# Patient Record
Sex: Male | Born: 1965 | Race: Black or African American | Hispanic: No | Marital: Married | Smoking: Current some day smoker
Health system: Southern US, Community
[De-identification: ages and names within clinical notes are randomized; demographics above are authoritative.]

## PROBLEM LIST (undated history)

## (undated) ENCOUNTER — Emergency Department (HOSPITAL_COMMUNITY): Admission: EM | Discharge status: Against Medical Advice | Payer: Medicare Other

## (undated) DIAGNOSIS — D649 Anemia, unspecified: Secondary | ICD-10-CM

## (undated) DIAGNOSIS — N189 Chronic kidney disease, unspecified: Secondary | ICD-10-CM

## (undated) DIAGNOSIS — Z5189 Encounter for other specified aftercare: Secondary | ICD-10-CM

## (undated) DIAGNOSIS — Z992 Dependence on renal dialysis: Secondary | ICD-10-CM

## (undated) DIAGNOSIS — K219 Gastro-esophageal reflux disease without esophagitis: Secondary | ICD-10-CM

## (undated) DIAGNOSIS — N186 End stage renal disease: Secondary | ICD-10-CM

## (undated) DIAGNOSIS — Z89512 Acquired absence of left leg below knee: Secondary | ICD-10-CM

## (undated) DIAGNOSIS — J40 Bronchitis, not specified as acute or chronic: Secondary | ICD-10-CM

## (undated) DIAGNOSIS — IMO0001 Reserved for inherently not codable concepts without codable children: Secondary | ICD-10-CM

## (undated) DIAGNOSIS — E213 Hyperparathyroidism, unspecified: Secondary | ICD-10-CM

## (undated) DIAGNOSIS — I499 Cardiac arrhythmia, unspecified: Secondary | ICD-10-CM

## (undated) DIAGNOSIS — Z8614 Personal history of Methicillin resistant Staphylococcus aureus infection: Secondary | ICD-10-CM

## (undated) DIAGNOSIS — I509 Heart failure, unspecified: Secondary | ICD-10-CM

## (undated) DIAGNOSIS — D631 Anemia in chronic kidney disease: Secondary | ICD-10-CM

## (undated) HISTORY — PX: OTHER SURGICAL HISTORY: SHX169

## (undated) HISTORY — DX: Heart failure, unspecified: I50.9

## (undated) HISTORY — DX: Morbid (severe) obesity due to excess calories: E66.01

## (undated) HISTORY — PX: ANKLE FUSION: SHX881

## (undated) HISTORY — PX: PARATHYROIDECTOMY: SHX19

## (undated) HISTORY — DX: Chronic kidney disease, unspecified: N18.9

## (undated) HISTORY — PX: HERNIA REPAIR: SHX51

## (undated) HISTORY — PX: THROMBECTOMY: PRO61

---

## 1999-03-24 ENCOUNTER — Emergency Department (HOSPITAL_COMMUNITY): Admission: EM | Admit: 1999-03-24 | Discharge: 1999-03-24 | Payer: Self-pay | Admitting: Internal Medicine

## 1999-03-28 ENCOUNTER — Emergency Department (HOSPITAL_COMMUNITY): Admission: EM | Admit: 1999-03-28 | Discharge: 1999-03-28 | Payer: Self-pay | Admitting: Emergency Medicine

## 1999-03-30 ENCOUNTER — Encounter: Admission: RE | Admit: 1999-03-30 | Discharge: 1999-06-28 | Payer: Self-pay

## 1999-04-05 ENCOUNTER — Encounter: Admission: RE | Admit: 1999-04-05 | Discharge: 1999-04-05 | Payer: Self-pay | Admitting: Hematology and Oncology

## 1999-04-12 ENCOUNTER — Emergency Department (HOSPITAL_COMMUNITY): Admission: EM | Admit: 1999-04-12 | Discharge: 1999-04-12 | Payer: Self-pay | Admitting: Emergency Medicine

## 1999-05-11 ENCOUNTER — Encounter: Admission: RE | Admit: 1999-05-11 | Discharge: 1999-05-11 | Payer: Self-pay | Admitting: Internal Medicine

## 1999-10-18 ENCOUNTER — Encounter: Admission: RE | Admit: 1999-10-18 | Discharge: 1999-10-18 | Payer: Self-pay | Admitting: Internal Medicine

## 2000-02-13 ENCOUNTER — Encounter: Admission: RE | Admit: 2000-02-13 | Discharge: 2000-02-13 | Payer: Self-pay | Admitting: Hematology and Oncology

## 2000-02-13 ENCOUNTER — Ambulatory Visit (HOSPITAL_COMMUNITY): Admission: RE | Admit: 2000-02-13 | Discharge: 2000-02-13 | Payer: Self-pay | Admitting: Internal Medicine

## 2000-02-18 ENCOUNTER — Encounter: Admission: RE | Admit: 2000-02-18 | Discharge: 2000-02-18 | Payer: Self-pay | Admitting: Internal Medicine

## 2000-04-26 ENCOUNTER — Emergency Department (HOSPITAL_COMMUNITY): Admission: EM | Admit: 2000-04-26 | Discharge: 2000-04-26 | Payer: Self-pay | Admitting: Internal Medicine

## 2001-02-25 ENCOUNTER — Encounter: Admission: RE | Admit: 2001-02-25 | Discharge: 2001-02-25 | Payer: Self-pay | Admitting: Internal Medicine

## 2001-07-21 ENCOUNTER — Emergency Department (HOSPITAL_COMMUNITY): Admission: EM | Admit: 2001-07-21 | Discharge: 2001-07-21 | Payer: Self-pay | Admitting: Emergency Medicine

## 2002-03-10 ENCOUNTER — Emergency Department (HOSPITAL_COMMUNITY): Admission: EM | Admit: 2002-03-10 | Discharge: 2002-03-10 | Payer: Self-pay | Admitting: Emergency Medicine

## 2002-08-06 ENCOUNTER — Encounter: Admission: RE | Admit: 2002-08-06 | Discharge: 2002-08-06 | Payer: Self-pay | Admitting: Internal Medicine

## 2002-08-16 ENCOUNTER — Encounter: Admission: RE | Admit: 2002-08-16 | Discharge: 2002-08-16 | Payer: Self-pay | Admitting: Internal Medicine

## 2002-08-16 ENCOUNTER — Encounter: Payer: Self-pay | Admitting: Hospitalist

## 2002-08-16 ENCOUNTER — Ambulatory Visit (HOSPITAL_COMMUNITY): Admission: RE | Admit: 2002-08-16 | Discharge: 2002-08-16 | Payer: Self-pay | Admitting: Hospitalist

## 2002-09-02 ENCOUNTER — Encounter: Admission: RE | Admit: 2002-09-02 | Discharge: 2002-09-02 | Payer: Self-pay | Admitting: Internal Medicine

## 2002-09-02 ENCOUNTER — Encounter (HOSPITAL_COMMUNITY): Admission: RE | Admit: 2002-09-02 | Discharge: 2002-12-01 | Payer: Self-pay | Admitting: Nephrology

## 2002-12-02 ENCOUNTER — Encounter (HOSPITAL_COMMUNITY): Admission: RE | Admit: 2002-12-02 | Discharge: 2003-03-02 | Payer: Self-pay | Admitting: Nephrology

## 2003-01-07 ENCOUNTER — Encounter: Admission: RE | Admit: 2003-01-07 | Discharge: 2003-01-07 | Payer: Self-pay | Admitting: Internal Medicine

## 2003-02-04 ENCOUNTER — Encounter: Admission: RE | Admit: 2003-02-04 | Discharge: 2003-02-04 | Payer: Self-pay | Admitting: Internal Medicine

## 2003-02-08 ENCOUNTER — Encounter: Admission: RE | Admit: 2003-02-08 | Discharge: 2003-02-08 | Payer: Self-pay | Admitting: Nephrology

## 2003-02-08 ENCOUNTER — Encounter: Payer: Self-pay | Admitting: Nephrology

## 2003-02-15 ENCOUNTER — Inpatient Hospital Stay (HOSPITAL_COMMUNITY): Admission: EM | Admit: 2003-02-15 | Discharge: 2003-02-28 | Payer: Self-pay | Admitting: Emergency Medicine

## 2003-02-16 ENCOUNTER — Encounter: Payer: Self-pay | Admitting: Pulmonary Disease

## 2003-02-16 ENCOUNTER — Encounter: Payer: Self-pay | Admitting: Critical Care Medicine

## 2003-02-16 ENCOUNTER — Encounter: Payer: Self-pay | Admitting: Cardiology

## 2003-02-17 ENCOUNTER — Encounter: Payer: Self-pay | Admitting: Pulmonary Disease

## 2003-03-02 ENCOUNTER — Encounter: Admission: RE | Admit: 2003-03-02 | Discharge: 2003-03-02 | Payer: Self-pay | Admitting: Internal Medicine

## 2003-03-03 ENCOUNTER — Emergency Department (HOSPITAL_COMMUNITY): Admission: EM | Admit: 2003-03-03 | Discharge: 2003-03-03 | Payer: Self-pay

## 2003-03-04 ENCOUNTER — Encounter: Admission: RE | Admit: 2003-03-04 | Discharge: 2003-03-04 | Payer: Self-pay | Admitting: Internal Medicine

## 2003-03-05 ENCOUNTER — Encounter: Payer: Self-pay | Admitting: Nephrology

## 2003-03-05 ENCOUNTER — Inpatient Hospital Stay (HOSPITAL_COMMUNITY): Admission: EM | Admit: 2003-03-05 | Discharge: 2003-03-17 | Payer: Self-pay | Admitting: Emergency Medicine

## 2003-03-08 ENCOUNTER — Encounter: Payer: Self-pay | Admitting: Nephrology

## 2003-03-11 ENCOUNTER — Encounter: Payer: Self-pay | Admitting: Specialist

## 2003-03-15 ENCOUNTER — Encounter: Payer: Self-pay | Admitting: Nephrology

## 2003-06-14 ENCOUNTER — Encounter (HOSPITAL_COMMUNITY): Admission: RE | Admit: 2003-06-14 | Discharge: 2003-09-12 | Payer: Self-pay | Admitting: Nephrology

## 2003-09-19 ENCOUNTER — Encounter (HOSPITAL_COMMUNITY): Admission: RE | Admit: 2003-09-19 | Discharge: 2003-12-18 | Payer: Self-pay | Admitting: Nephrology

## 2003-10-06 ENCOUNTER — Emergency Department (HOSPITAL_COMMUNITY): Admission: AD | Admit: 2003-10-06 | Discharge: 2003-10-06 | Payer: Self-pay | Admitting: Family Medicine

## 2003-12-22 ENCOUNTER — Ambulatory Visit (HOSPITAL_COMMUNITY): Admission: RE | Admit: 2003-12-22 | Discharge: 2003-12-22 | Payer: Self-pay | Admitting: *Deleted

## 2004-01-04 ENCOUNTER — Encounter (HOSPITAL_COMMUNITY): Admission: RE | Admit: 2004-01-04 | Discharge: 2004-04-03 | Payer: Self-pay | Admitting: Nephrology

## 2004-01-12 ENCOUNTER — Encounter: Admission: RE | Admit: 2004-01-12 | Discharge: 2004-01-12 | Payer: Self-pay | Admitting: Internal Medicine

## 2004-04-05 ENCOUNTER — Encounter (HOSPITAL_COMMUNITY): Admission: RE | Admit: 2004-04-05 | Discharge: 2004-07-04 | Payer: Self-pay | Admitting: Nephrology

## 2004-04-09 ENCOUNTER — Emergency Department (HOSPITAL_COMMUNITY): Admission: EM | Admit: 2004-04-09 | Discharge: 2004-04-10 | Payer: Self-pay | Admitting: Family Medicine

## 2004-04-19 ENCOUNTER — Ambulatory Visit: Payer: Self-pay | Admitting: Internal Medicine

## 2004-06-19 DIAGNOSIS — Z992 Dependence on renal dialysis: Secondary | ICD-10-CM | POA: Insufficient documentation

## 2004-08-28 ENCOUNTER — Ambulatory Visit (HOSPITAL_COMMUNITY): Admission: RE | Admit: 2004-08-28 | Discharge: 2004-08-28 | Payer: Self-pay | Admitting: *Deleted

## 2004-09-24 ENCOUNTER — Ambulatory Visit (HOSPITAL_COMMUNITY): Admission: RE | Admit: 2004-09-24 | Discharge: 2004-09-24 | Payer: Self-pay | Admitting: Nephrology

## 2004-10-04 ENCOUNTER — Ambulatory Visit (HOSPITAL_COMMUNITY): Admission: RE | Admit: 2004-10-04 | Discharge: 2004-10-04 | Payer: Self-pay | Admitting: Vascular Surgery

## 2004-10-25 ENCOUNTER — Emergency Department (HOSPITAL_COMMUNITY): Admission: EM | Admit: 2004-10-25 | Discharge: 2004-10-25 | Payer: Self-pay | Admitting: Emergency Medicine

## 2004-11-05 ENCOUNTER — Ambulatory Visit (HOSPITAL_COMMUNITY): Admission: RE | Admit: 2004-11-05 | Discharge: 2004-11-05 | Payer: Self-pay | Admitting: Vascular Surgery

## 2004-11-19 ENCOUNTER — Ambulatory Visit: Payer: Self-pay | Admitting: Internal Medicine

## 2004-11-30 DIAGNOSIS — I10 Essential (primary) hypertension: Secondary | ICD-10-CM | POA: Insufficient documentation

## 2004-12-06 ENCOUNTER — Ambulatory Visit (HOSPITAL_COMMUNITY): Admission: RE | Admit: 2004-12-06 | Discharge: 2004-12-06 | Payer: Self-pay | Admitting: Surgery

## 2005-02-18 ENCOUNTER — Ambulatory Visit (HOSPITAL_COMMUNITY): Admission: RE | Admit: 2005-02-18 | Discharge: 2005-02-18 | Payer: Self-pay | Admitting: Vascular Surgery

## 2005-02-19 ENCOUNTER — Ambulatory Visit (HOSPITAL_COMMUNITY): Admission: RE | Admit: 2005-02-19 | Discharge: 2005-02-19 | Payer: Self-pay | Admitting: Vascular Surgery

## 2005-03-13 ENCOUNTER — Ambulatory Visit (HOSPITAL_COMMUNITY): Admission: RE | Admit: 2005-03-13 | Discharge: 2005-03-13 | Payer: Self-pay | Admitting: Vascular Surgery

## 2005-06-18 ENCOUNTER — Ambulatory Visit (HOSPITAL_COMMUNITY): Admission: RE | Admit: 2005-06-18 | Discharge: 2005-06-18 | Payer: Self-pay | Admitting: Vascular Surgery

## 2005-08-08 ENCOUNTER — Ambulatory Visit (HOSPITAL_COMMUNITY): Admission: RE | Admit: 2005-08-08 | Discharge: 2005-08-08 | Payer: Self-pay | Admitting: Nephrology

## 2006-12-30 ENCOUNTER — Emergency Department (HOSPITAL_COMMUNITY): Admission: EM | Admit: 2006-12-30 | Discharge: 2006-12-30 | Payer: Self-pay | Admitting: Emergency Medicine

## 2007-01-07 ENCOUNTER — Emergency Department (HOSPITAL_COMMUNITY): Admission: EM | Admit: 2007-01-07 | Discharge: 2007-01-07 | Payer: Self-pay | Admitting: Emergency Medicine

## 2007-02-05 ENCOUNTER — Encounter: Admission: RE | Admit: 2007-02-05 | Discharge: 2007-02-05 | Payer: Self-pay | Admitting: Orthopedic Surgery

## 2007-02-09 ENCOUNTER — Encounter (HOSPITAL_COMMUNITY): Admission: RE | Admit: 2007-02-09 | Discharge: 2007-04-20 | Payer: Self-pay | Admitting: Nephrology

## 2007-02-12 ENCOUNTER — Inpatient Hospital Stay (HOSPITAL_COMMUNITY): Admission: RE | Admit: 2007-02-12 | Discharge: 2007-02-15 | Payer: Self-pay | Admitting: Orthopedic Surgery

## 2007-02-17 ENCOUNTER — Ambulatory Visit: Payer: Self-pay | Admitting: Internal Medicine

## 2007-03-03 ENCOUNTER — Ambulatory Visit (HOSPITAL_COMMUNITY): Admission: RE | Admit: 2007-03-03 | Discharge: 2007-03-03 | Payer: Self-pay | Admitting: Internal Medicine

## 2007-03-03 ENCOUNTER — Encounter: Payer: Self-pay | Admitting: Internal Medicine

## 2007-03-18 ENCOUNTER — Ambulatory Visit: Payer: Self-pay | Admitting: Internal Medicine

## 2007-04-09 ENCOUNTER — Encounter (HOSPITAL_COMMUNITY): Admission: RE | Admit: 2007-04-09 | Discharge: 2007-06-18 | Payer: Self-pay | Admitting: Nephrology

## 2007-07-30 ENCOUNTER — Inpatient Hospital Stay (HOSPITAL_COMMUNITY): Admission: RE | Admit: 2007-07-30 | Discharge: 2007-07-31 | Payer: Self-pay | Admitting: Orthopedic Surgery

## 2007-09-03 ENCOUNTER — Encounter: Admission: EM | Admit: 2007-09-03 | Discharge: 2007-09-03 | Payer: Self-pay | Admitting: Dentistry

## 2007-09-03 ENCOUNTER — Ambulatory Visit: Payer: Self-pay | Admitting: Dentistry

## 2007-09-08 ENCOUNTER — Ambulatory Visit: Payer: Self-pay | Admitting: Dentistry

## 2007-10-22 DIAGNOSIS — I519 Heart disease, unspecified: Secondary | ICD-10-CM | POA: Insufficient documentation

## 2007-10-22 DIAGNOSIS — I739 Peripheral vascular disease, unspecified: Secondary | ICD-10-CM | POA: Insufficient documentation

## 2007-10-22 DIAGNOSIS — Z8639 Personal history of other endocrine, nutritional and metabolic disease: Secondary | ICD-10-CM

## 2007-10-22 DIAGNOSIS — D631 Anemia in chronic kidney disease: Secondary | ICD-10-CM | POA: Insufficient documentation

## 2007-10-22 DIAGNOSIS — N186 End stage renal disease: Secondary | ICD-10-CM | POA: Insufficient documentation

## 2007-10-22 DIAGNOSIS — Z862 Personal history of diseases of the blood and blood-forming organs and certain disorders involving the immune mechanism: Secondary | ICD-10-CM

## 2008-06-10 ENCOUNTER — Ambulatory Visit (HOSPITAL_COMMUNITY): Admission: RE | Admit: 2008-06-10 | Discharge: 2008-06-10 | Payer: Self-pay | Admitting: Nephrology

## 2008-07-21 ENCOUNTER — Ambulatory Visit (HOSPITAL_COMMUNITY): Admission: RE | Admit: 2008-07-21 | Discharge: 2008-07-21 | Payer: Self-pay | Admitting: Orthopedic Surgery

## 2008-11-22 ENCOUNTER — Inpatient Hospital Stay (HOSPITAL_COMMUNITY): Admission: RE | Admit: 2008-11-22 | Discharge: 2008-11-25 | Payer: Self-pay | Admitting: Surgery

## 2008-11-22 ENCOUNTER — Encounter (INDEPENDENT_AMBULATORY_CARE_PROVIDER_SITE_OTHER): Payer: Self-pay | Admitting: Surgery

## 2009-12-01 ENCOUNTER — Emergency Department (HOSPITAL_COMMUNITY): Admission: EM | Admit: 2009-12-01 | Discharge: 2009-12-01 | Payer: Self-pay | Admitting: Emergency Medicine

## 2010-01-20 ENCOUNTER — Emergency Department (HOSPITAL_COMMUNITY): Admission: EM | Admit: 2010-01-20 | Discharge: 2010-01-21 | Payer: Self-pay | Admitting: Emergency Medicine

## 2010-01-23 ENCOUNTER — Ambulatory Visit (HOSPITAL_COMMUNITY): Admission: RE | Admit: 2010-01-23 | Discharge: 2010-01-23 | Payer: Self-pay | Admitting: Orthopedic Surgery

## 2010-07-27 ENCOUNTER — Ambulatory Visit (HOSPITAL_COMMUNITY)
Admission: RE | Admit: 2010-07-27 | Discharge: 2010-07-27 | Payer: Self-pay | Source: Home / Self Care | Attending: Nephrology | Admitting: Nephrology

## 2010-08-05 ENCOUNTER — Encounter: Payer: Self-pay | Admitting: Nephrology

## 2010-08-27 ENCOUNTER — Ambulatory Visit (INDEPENDENT_AMBULATORY_CARE_PROVIDER_SITE_OTHER): Payer: Medicare Other | Admitting: Surgery

## 2010-08-27 DIAGNOSIS — N186 End stage renal disease: Secondary | ICD-10-CM

## 2010-08-27 DIAGNOSIS — T82898A Other specified complication of vascular prosthetic devices, implants and grafts, initial encounter: Secondary | ICD-10-CM

## 2010-08-28 ENCOUNTER — Inpatient Hospital Stay (HOSPITAL_COMMUNITY)
Admission: EM | Admit: 2010-08-28 | Discharge: 2010-09-01 | DRG: 252 | Disposition: A | Discharge status: Home / Self Care | Payer: Medicare Other | Source: Ambulatory Visit | Attending: Internal Medicine | Admitting: Internal Medicine

## 2010-08-28 DIAGNOSIS — N186 End stage renal disease: Secondary | ICD-10-CM | POA: Diagnosis present

## 2010-08-28 DIAGNOSIS — F17200 Nicotine dependence, unspecified, uncomplicated: Secondary | ICD-10-CM | POA: Diagnosis present

## 2010-08-28 DIAGNOSIS — A4901 Methicillin susceptible Staphylococcus aureus infection, unspecified site: Secondary | ICD-10-CM | POA: Diagnosis present

## 2010-08-28 DIAGNOSIS — I428 Other cardiomyopathies: Secondary | ICD-10-CM | POA: Diagnosis present

## 2010-08-28 DIAGNOSIS — Z992 Dependence on renal dialysis: Secondary | ICD-10-CM

## 2010-08-28 DIAGNOSIS — T827XXA Infection and inflammatory reaction due to other cardiac and vascular devices, implants and grafts, initial encounter: Principal | ICD-10-CM | POA: Diagnosis present

## 2010-08-28 DIAGNOSIS — R7881 Bacteremia: Secondary | ICD-10-CM | POA: Diagnosis present

## 2010-08-28 DIAGNOSIS — D696 Thrombocytopenia, unspecified: Secondary | ICD-10-CM | POA: Diagnosis present

## 2010-08-28 DIAGNOSIS — IMO0002 Reserved for concepts with insufficient information to code with codable children: Secondary | ICD-10-CM | POA: Diagnosis present

## 2010-08-28 DIAGNOSIS — I120 Hypertensive chronic kidney disease with stage 5 chronic kidney disease or end stage renal disease: Secondary | ICD-10-CM | POA: Diagnosis present

## 2010-08-28 DIAGNOSIS — N2581 Secondary hyperparathyroidism of renal origin: Secondary | ICD-10-CM | POA: Diagnosis present

## 2010-08-28 DIAGNOSIS — Y832 Surgical operation with anastomosis, bypass or graft as the cause of abnormal reaction of the patient, or of later complication, without mention of misadventure at the time of the procedure: Secondary | ICD-10-CM | POA: Diagnosis present

## 2010-08-28 DIAGNOSIS — E119 Type 2 diabetes mellitus without complications: Secondary | ICD-10-CM | POA: Diagnosis present

## 2010-08-28 NOTE — Assessment & Plan Note (Signed)
OFFICE VISIT  [name], [name] DOB:  1965/11/08                                       08/27/2010 [id number]  REASON FOR VISIT:  Questionable infected fistula.  HISTORY:  This is a 45 year old gentleman with end-stage renal disease who dialyzes through a right upper arm AV fistula.  He has previously undergone thrombolysis and had a stent placed in his cephalic vein fistula.  Today he noticed some pus coming out of the midportion.  He dialyzed today, and he had a fever.  He was sent over for evaluation.  PHYSICAL EXAMINATION:  Heart rate 116, blood pressure 105/70, temperature 99.8.  General:  He is well-appearing in no distress. Respirations are nonlabored.  There is a palpable thrill within his fistula.  There is a small, less than 1 mm skin tear in between his 2 access sites for dialysis today.  I could not express any fluid.  Based on the location of this stent, I feel like this infection area is no where near where his stent is.  This should heal.  I would continue him with at least 2 weeks of antibiotics and tailor them based to what his cultures show.  I have told the patient to watch out to make sure the skin area does not get larger.  Obvious, if it does, we will have to consider ligation to prevent rupture.  He is going to follow up on a p.r.n. basis.    Eldridge Abrahams, MD Electronically Signed  VWB/MEDQ  D:  08/27/2010  T:  08/28/2010  Job:  [id number]

## 2010-08-29 ENCOUNTER — Inpatient Hospital Stay (HOSPITAL_COMMUNITY): Payer: Medicare Other

## 2010-08-29 ENCOUNTER — Emergency Department (HOSPITAL_COMMUNITY): Payer: Medicare Other

## 2010-08-29 DIAGNOSIS — I120 Hypertensive chronic kidney disease with stage 5 chronic kidney disease or end stage renal disease: Secondary | ICD-10-CM

## 2010-08-29 DIAGNOSIS — N186 End stage renal disease: Secondary | ICD-10-CM

## 2010-08-29 DIAGNOSIS — T82898A Other specified complication of vascular prosthetic devices, implants and grafts, initial encounter: Secondary | ICD-10-CM

## 2010-08-29 LAB — GLUCOSE, CAPILLARY
Glucose-Capillary: 330 mg/dL — ABNORMAL HIGH (ref 70–99)
Glucose-Capillary: 134 mg/dL — ABNORMAL HIGH (ref 70–99)
Glucose-Capillary: 157 mg/dL — ABNORMAL HIGH (ref 70–99)

## 2010-08-29 LAB — CBC
MCV: 91 fL (ref 78.0–100.0)
Platelets: 50 10*3/uL — ABNORMAL LOW (ref 150–400)
RDW: 15.5 % (ref 11.5–15.5)
WBC: 12.3 10*3/uL — ABNORMAL HIGH (ref 4.0–10.5)

## 2010-08-29 LAB — BASIC METABOLIC PANEL WITH GFR
BUN: 55 mg/dL — ABNORMAL HIGH (ref 6–23)
CO2: 22 meq/L (ref 19–32)
Calcium: 7.3 mg/dL — ABNORMAL LOW (ref 8.4–10.5)
Chloride: 91 meq/L — ABNORMAL LOW (ref 96–112)
Creatinine, Ser: 14.56 mg/dL — ABNORMAL HIGH (ref 0.4–1.5)
GFR calc non Af Amer: 4 mL/min — ABNORMAL LOW
Glucose, Bld: 417 mg/dL — ABNORMAL HIGH (ref 70–99)
Potassium: 4.2 meq/L (ref 3.5–5.1)
Sodium: 131 meq/L — ABNORMAL LOW (ref 135–145)

## 2010-08-29 LAB — LACTIC ACID, PLASMA: Lactic Acid, Venous: 2.7 mmol/L — ABNORMAL HIGH (ref 0.5–2.2)

## 2010-08-29 LAB — MRSA PCR SCREENING: MRSA by PCR: NEGATIVE

## 2010-08-30 ENCOUNTER — Inpatient Hospital Stay (HOSPITAL_COMMUNITY): Payer: Medicare Other

## 2010-08-30 LAB — CBC
MCV: 90.9 fL (ref 78.0–100.0)
Platelets: 40 10*3/uL — ABNORMAL LOW (ref 150–400)
RBC: 3.42 MIL/uL — ABNORMAL LOW (ref 4.22–5.81)
RDW: 15.6 % — ABNORMAL HIGH (ref 11.5–15.5)
WBC: 9.5 10*3/uL (ref 4.0–10.5)

## 2010-08-30 LAB — GLUCOSE, CAPILLARY
Glucose-Capillary: 123 mg/dL — ABNORMAL HIGH (ref 70–99)
Glucose-Capillary: 190 mg/dL — ABNORMAL HIGH (ref 70–99)
Glucose-Capillary: 117 mg/dL — ABNORMAL HIGH (ref 70–99)

## 2010-08-30 LAB — RENAL FUNCTION PANEL
Albumin: 2.7 g/dL — ABNORMAL LOW (ref 3.5–5.2)
BUN: 65 mg/dL — ABNORMAL HIGH (ref 6–23)
Chloride: 97 mEq/L (ref 96–112)
GFR calc non Af Amer: 3 mL/min — ABNORMAL LOW (ref >60)
Phosphorus: 7.6 mg/dL — ABNORMAL HIGH (ref 2.3–4.6)
Potassium: 4.8 mEq/L (ref 3.5–5.1)
Sodium: 134 mEq/L — ABNORMAL LOW (ref 135–145)

## 2010-08-30 NOTE — Op Note (Signed)
[name], [name]             ACCOUNT NO.:  [id number]  MEDICAL RECORD NO.:  [id number]           PATIENT TYPE:  I  LOCATION:  [id number]                         FACILITY:  [facility]  PHYSICIAN:  Conrad Bevil Oaks, MD       DATE OF BIRTH:  [birth year]  DATE OF PROCEDURE:  08/29/2010 DATE OF DISCHARGE:                              OPERATIVE REPORT   PROCEDURES: 1. Ligation of right brachiocephalic arteriovenous fistula. 2. Incision and drainage of right arm abscess. 3. Cannulation of right internal jugular vein under ultrasound     guidance. 4. Placement of right internal jugular vein tunneled dialysis     catheter.  PREOPERATIVE DIAGNOSES:  Possible infected right arteriovenous fistula and also end-stage renal disease requiring hemodialysis.  POSTOPERATIVE DIAGNOSES:  Possible infected right arteriovenous fistula and also end-stage renal disease requiring hemodialysis.  SURGEON:  Conrad Swanton, MD  ANESTHESIA:  General endotracheal.  FINDINGS: 1. In this case included tips of the tunneled dialysis catheter in the     right atrium. 2. There was a right arm abscess eroding into pseudoaneurysmal segment     of the right brachiocephalic arteriovenous fistula.  SPECIMENS:  Specimens in this case included anaerobic and aerobic cultures, the right arm abscess and 2 wound cultures of the abscess wall.  DISPOSITION OF THE SPECIMENS:  To pathology.  ESTIMATED BLOOD LOSS:  About 50 mL.  INDICATIONS:  This is a 45 year old gentleman that was admitted to the hospital by the hospitalist service for possible abscess in the right arm at his old pseudoaneurysmal brachiocephalic arteriovenous fistula.  Per the patient he has had this fistula now since 2005.  He only recently started having pus draining from the right arm at a cannulation site.  We discussed the plan to basically ligate this fistula, drain the abscess and placed a tunneled dialysis catheter.  He was aware of the risks of this  procedure which included bleeding, infection, possible injury to central venous structures and possible pneumothorax. He was aware of the risk and agreed to proceed forward such.  DESCRIPTION OF OPERATION:  After full informed written consent was obtained from the patient, he was brought back to the operating room and placed supine upon the operating table.  He had received IV antibiotics as part of his routine therapeutic management for presumed right arm abscess.  After obtaining adequate anesthesia, he was prepped and draped in the standard fashion for a chest or neck tunneled dialysis catheter placement.  I turned my attention first to his right neck.  Under ultrasound guidance, I identified a right internal jugular vein.  I cannulated it twice without any difficulties but at each time the wire failed to advance, so I reevaluated the vein at a higher level and cannulated it under ultrasound guidance and this time the wire was able to pass down into inferior vena cava.  I clamped the wire to the drapes and then made a stab incision on the chest and also at the neck cannulation sites.  Using metal tunneler, I dissected from the chest to the neck and then passed a dilator over this metal tunneler in  a retrograde fashion in order to dilate the subcutaneous tunnel for this catheter. At this point then I unclamped the wire, took off the needle and then under fluoroscopic guidance serially dilated up the tissue tract and also the venotomy and then finally I placed the peel-away dilator sheath over the wire under fluoroscopic guidance, and advanced it down into the superior vena cava.  The wire and dilator were removed.  A 23-cm Diatek catheter was then loaded through this catheter.  The sheath was broken and peeled away keeping the cuff of the catheter at the skin level.  I then connected the back end of this catheter to the metal tunneler and passed the catheter in retrograde fashion  through the subcutaneous tunnel, then transected the back end of this catheter revealing the two lumens.  Two ports were docked to these 2 lumens and then the catheter hub was screwed in place.  I then adjusted the catheter so the cuff was about a centimeter away from the exit site.  Under fluoroscopic guidance, then I evaluated the entirety of this catheter and the tip of the catheter was found to be in the right atrium.  There were no kinks along the entire length of this catheter.  Then I tested each port, which aspirated and flushed without any resistance, and then I filled each port with heparinized saline.  The catheter was then secured in place with 2 interrupted stitches of 3-0 nylon tied to the catheter.  The neck incision was closed with a U stitch of 4-0 Monocryl.  The skin was then cleaned, dried and Octylseal was applied to the neck incision and then each of the ports were loaded with concentrated heparin at 1000 unit/mL concentration at the manufacturer recommended volumes to each port and then each port was sterilely capped.  The exit site was then cleaned, dried and then sterile bandages were applied.    At this point the drapes were taken down and the patient was repositioned  for the next portion of this case.  He was prepped and draped in a standard  fashion for a right arm access procedure.  I turned my attention first to  his antecubitum. I made a transverse incision through the previous incision here and dissected down to the brachiocephalic arteriovenous fistula.  It was widely patent at this location.  I then dissected out the cephalic vein and then placed two 0 silk ties around the fistula and tied off the fistula.  Then I dissected little more distally and in a similar fashion put 2-0 silks around the fistula and tied off the fistula.  At this point there was no more further pulse in the upper arm fistula.  I reapproximated the subcutaneous tissue with running  stitch of 3-0 Vicryl and then the skin was closed with running subcuticular 4-0 Monocryl. Skin was cleaned, dried and then Octylseal applied to this incision to seal the skin.  I then put a sterile towel over this arm to separate it from the upper arm.    I turned my attention to the upper arm and made an elliptical incision  through the black eschar here and dissected out the skin, the subcutaneous  tissue was noted to be necrotic and eventually I dissected down to a frankly  purulent pocket.  In exploring briefly this pocket, it extended into the  arteriovenous fistula, it was not clear to me if there was a pseudoaneurysmal  segment that extended up the level of skin and  had necrosed or whether or not an abscess had eroded into this arteriovenous fistula.  In this exploration,  I evacuated approximately 30 mL of pus and there was still some venous bleeding  from this fistula, so at this point I obtained a 5 Fogarty balloon and stuck it  into the venous outflow tract and inflated the balloon to occlude the venous outflow and kept the balloon up with a stopcock.  At this point using electrocautery, I opened  longitudinally this abscess cavity.  The cavity appeared to extend down into  the brachiocephalic arteriovenous fistula.  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  I turned my attention to the outflow tract.  After dissecting out it further with electrocautery, I closed off the outflow tract with a running stitch of 3-0 Prolene.  There was still a little bit of bleeding from the distal end of this brachiocephalic fistula, so it is not clear to me if there was still some open side branches feeding into this fistula, so I took down the distal end of this fistula and then tied it off  with running stitch of 3-0 Prolene.  Note that this portion of the fistula was quite aneurysmal.  At this point there was no more active bleeding within this surgical site.  I had concerns with further dissection that there would be possibility of nerve injury and as there was extensive amount of scar tissue in this patient with keloids, so I did not feel it was safe to completely transect the lumen of this fistula, so I elected at this point to stop and pack the wound with wet to dry.  The eventual plan is once bleeding and no active drainage of purulence has stop that the patient should have a VAC dressing placed in this wound.  At this point we obtained 2 sterile 4x4 dressings soaked in sterile normal saline, and wrung them out and then packed them into this wound.  The arm was then bandaged with some sterile 4x4s and wrapped with a Kerlix.  The patient was allowed to awaken without difficulties. The plan is to continue his admission.  COMPLICATIONS:  There were no complications.  CONDITION:  The patient was in good condition at the end of case.    Conrad Sterlington, MD     BLC/MEDQ  D:  08/29/2010  T:  08/30/2010  Job:  [id number]  Electronically Signed by Adele Barthel MD on 08/30/2010 08:48:52 PM

## 2010-08-31 ENCOUNTER — Inpatient Hospital Stay (HOSPITAL_COMMUNITY): Payer: Medicare Other

## 2010-08-31 DIAGNOSIS — D696 Thrombocytopenia, unspecified: Secondary | ICD-10-CM

## 2010-08-31 LAB — CBC
HCT: 31.3 % — ABNORMAL LOW (ref 39.0–52.0)
MCH: 28 pg (ref 26.0–34.0)
MCHC: 30.7 g/dL (ref 30.0–36.0)
MCV: 91.3 fL (ref 78.0–100.0)
Platelets: 25 10*3/uL — CL (ref 150–400)
RDW: 15.4 % (ref 11.5–15.5)
WBC: 6.7 10*3/uL (ref 4.0–10.5)

## 2010-08-31 LAB — RENAL FUNCTION PANEL
Albumin: 2.7 g/dL — ABNORMAL LOW (ref 3.5–5.2)
BUN: 46 mg/dL — ABNORMAL HIGH (ref 6–23)
Creatinine, Ser: 12.42 mg/dL — ABNORMAL HIGH (ref 0.4–1.5)
Glucose, Bld: 181 mg/dL — ABNORMAL HIGH (ref 70–99)
Phosphorus: 6.7 mg/dL — ABNORMAL HIGH (ref 2.3–4.6)
Potassium: 3.8 mEq/L (ref 3.5–5.1)

## 2010-08-31 LAB — DIC (DISSEMINATED INTRAVASCULAR COAGULATION)PANEL
D-Dimer, Quant: 1.63 ug/mL-FEU — ABNORMAL HIGH (ref 0.00–0.48)
Fibrinogen: 635 mg/dL — ABNORMAL HIGH (ref 204–475)
Platelets: 29 10*3/uL — CL (ref 150–400)
Prothrombin Time: 13.9 seconds (ref 11.6–15.2)
Smear Review: NONE SEEN

## 2010-08-31 LAB — GLUCOSE, CAPILLARY: Glucose-Capillary: 108 mg/dL — ABNORMAL HIGH (ref 70–99)

## 2010-09-01 LAB — CULTURE, ROUTINE-ABSCESS

## 2010-09-01 LAB — CBC
Hemoglobin: 10.9 g/dL — ABNORMAL LOW (ref 13.0–17.0)
MCH: 28.1 pg (ref 26.0–34.0)
MCHC: 30.9 g/dL (ref 30.0–36.0)
MCV: 91 fL (ref 78.0–100.0)
RBC: 3.88 MIL/uL — ABNORMAL LOW (ref 4.22–5.81)

## 2010-09-01 LAB — CULTURE, BLOOD (ROUTINE X 2)

## 2010-09-01 LAB — TISSUE CULTURE

## 2010-09-03 LAB — ANAEROBIC CULTURE

## 2010-09-03 LAB — POCT I-STAT 4, (NA,K, GLUC, HGB,HCT)
Glucose, Bld: 197 mg/dL — ABNORMAL HIGH (ref 70–99)
Hemoglobin: 13.3 g/dL (ref 13.0–17.0)
Potassium: 4.6 mEq/L (ref 3.5–5.1)

## 2010-09-03 NOTE — Discharge Summary (Signed)
[name], [name]             ACCOUNT NO.:  [id number]  MEDICAL RECORD NO.:  [id number]           PATIENT TYPE:  I  LOCATION:  [id number]                         FACILITY:  [facility]  PHYSICIAN:  Oren Binet, MD    DATE OF BIRTH:  [birth year]  DATE OF ADMISSION:  08/28/2010 DATE OF DISCHARGE:                        DISCHARGE SUMMARY - REFERRING   PRIMARY CARE PRACTITIONER AND PRIMARY NEPHROLOGIST:  At [facility].  PRIMARY DISCHARGE DIAGNOSES: 1. MSSA bacteremia. 2. AV graft infection, status post ligation and incision and drainage     of the right arm abscess. 3. Thrombocytopenia, probably secondary to sepsis.  SECONDARY DISCHARGE DIAGNOSES: 1. End-stage renal disease, on hemodialysis. 2. Type 2 diabetes. 3. Morbid obesity. 4. History of hypertension, but not on any medications.  DISCHARGE MEDICATIONS: 1. Ancef 2 grams IV with each hemodialysis, 28 more days. 2. Zemplar 2 mcg intravenously Tuesdays, Thursdays, and Saturdays. 3. Renal vitamin 1 tablet p.o. at bedtime. 4. NovoLog insulin 70/30 26 units in the a.m. and 30 units at bedtime. 5. Tums 1-2 tablets p.o. daily p.r.n.  CONSULTATIONS: 1. [facility]. 2. Dr. Marin Olp from Hematology.  BRIEF HISTORY OF PRESENT ILLNESS:  The patient is a very pleasant 45-year-old black male with history of end-stage renal disease, on hemodialysis Mondays, Wednesdays, and Fridays, who came in on August 29, 2010 complaining of fever and drainage from the AV fistula site.  He had a documented fever of 103.1 as well as tachycardia in the ED, around 120-140 beats per minute.  He was then seen and evaluated by the hospitalist service and admitted to the hospital.  For further details, please see the history and physical that was dictated by Dr. Arnoldo Morale on admission.  PERTINENT LABORATORY DATA: 1. Blood cultures from August 29, 2010 is growing methicillin-     sensitive MSSA. 2. Culture of the abscess is also growing MSSA. 3.  Discharge CBC shows a WBC of 8.7, hemoglobin of 10.9, and platelet     count of 447. 4. Lowest platelet count was recorded on August 31, 2010 at 25. 5. Admitting platelet count was 50.  PERTINENT RADIOLOGICAL STUDIES:  X-ray of the chest 2-views showed no acute cardiopulmonary changes.  PROCEDURES PERFORMED: 1. The patient underwent ligation of the right brachiocephalic     arteriovenous fistula. 2. Incision and drainage of the right arm. 3. Cannulation of the right internal jugular vein under ultrasound     guidance. 4. Placement of a right internal jugular vein tunneled dialysis     catheter.  BRIEF HOSPITAL COURSE: 1. MSSA bacteremia.  This was secondary to his infected AV fistula.     He was seen and evaluated by Vein and Vascular specialist and taken     to the OR for the above-noted procedure.  Subsequent cultures from     his blood and from his wound have grown MSSA.  The patient     initially was empirically started on vancomycin and cefepime.     After obtaining his cultures, he was noted down to vanco and now to    Ancef on discharge.  He will complete a 4-week course during  dialysis.  A 2-D echocardiogram was ordered, however, its still has     not been done, and the patient is already dressed up and very     anxious and insisting on being discharged home.  Risk of infective     endocarditis has been explained to the patient by me.  If he     continues to have fever, he is to return to the hospital for     further workup to rule out infective endocarditis. 2. Infected right AV fistula.  This is status post ligation and also     incision and drainage of the surrounding abscess.  This was done by     Dr. Bridgett Larsson from Vein and Vascular services.  Current wound care     orders are to apply wet-to-dry dressings to the right upper     extremity with gauze and Kerlix twice a day.  The patient claims he     can easily do this at his home by himself and again wants to be      discharged today.  He has actually been cleared to be discharged     from both Vein and Vascular services as well as Nephrology service     today.  He will follow up with Dr. Bridgett Larsson from Vein and Vascular     services in the next 5-7 days.  His telephone number has been left     on the chart for the patient to call and make an appointment. 3. Thrombocytopenia.  The patient had thrombocytopenia evident on     admission.  His platelet count was 50.  Review of prior records     showed that he did have chronic thrombocytopenia, perhaps in the     low 90s to low 100s, but never this low.  This was thought to be     secondary to underlying sepsis.  His platelet count did drop during     his hospitalization to 25.  Hematology was consulted.  They did     review his smear and his lab work and thought this was probably as     a result of sepsis.  Cefepime was also discontinued at the same     time.  His platelet counts have started to come up.  On the day of     discharge, it is 47.  The patient has evidence of joint platelets     on his smear, but does not have any evidence of clinical bleeding.     He will be discharged home today.  His heparin-induced     thrombocytopenia panel has been sent and is currently pending and     will need followup when he follows up with his nephrologist. 4. Diabetes.  The patient's insulin regimen has been changed to the     above on discharge.  This was also explained to the patient in     detail.  DISPOSITION:  The patient is considered more stable to be discharged home today.  FOLLOWUP INSTRUCTIONS: 1. He is to follow up with Dr. Bridgett Larsson from [facility] and V[facility] services in     5-7 days.  He is to call and make an appointment.  He claims     understanding. 2. He is to follow up with his nephrologist at [facility] at his     next scheduled hemodialysis. 3. He needs 4 weeks of Ancef during dialysis. 4. Wound care instructions are wet-to-dry dressing to the  right upper     extremity with normal saline gauze and Kerlix twice a day till he     sees Dr. Bridgett Larsson.  Total time spent equals 45 minutes.     Oren Binet, MD     SG/MEDQ  D:  09/01/2010  T:  09/01/2010  Job:  [id number]  cc:   Conrad Rosedale, MD [facility]  Electronically Signed by Oren Binet  on 09/03/2010 04:31:28 PM

## 2010-09-04 LAB — CULTURE, BLOOD (ROUTINE X 2): Culture  Setup Time: 201202151326

## 2010-09-04 LAB — HEPARIN INDUCED THROMBOCYTOPENIA PNL
Patient O.D.: 0.185
UFH SRA Result: NEGATIVE

## 2010-09-10 NOTE — Consult Note (Signed)
NAMELAVONE, LAUTURE             ACCOUNT NO.:  [id number]  MEDICAL RECORD NO.:  [id number]           PATIENT TYPE:  I  LOCATION:  [id number]                         FACILITY:  [facility]  PHYSICIAN:  Rudell Cobb. Marin Olp, M.D. DATE OF BIRTH:  05-23-1966  DATE OF CONSULTATION:  08/31/2010 DATE OF DISCHARGE:                                CONSULTATION   REFERRING PHYSICIAN:  Oren Binet, MD  REASON FOR CONSULTATION: 1. Thrombocytopenia. 2. Sepsis secondary to staph aureus. 3. Chronic renal failure, on hemodialysis.  HISTORY OF PRESENT ILLNESS:  [name] is a 45 year old black gentleman.  He has chronic renal failure.  He has diabetes and hypertension.  He is on dialysis for 6 years.  He apparently also has dilated cardiomyopathy.  He apparently was admitted because of sepsis.  He had incision and drainage of the right arm abscess.  This grew out of staph aureus.  He has been on IV antibiotics.  He has been taking vancomycin.  He was admitted on the 14th.  On admission, his platelet count was 50,000.  His platelet count has dropped.  It is 40,000 on the 16th.  His renal function showed BUN of 46 and creatinine 112.4.  His CBC today showed white cell count 6.7, hemoglobin 9.6, hematocrit 31.3, platelet count 25,000.  His DIC panel showed a fibrinogen of 635.  D-dimer is 1.63.  We were asked to see him because of the thrombocytopenia.  Again, he has been on vancomycin.  I do not think there has been other changes with his medications.  He has had no obvious bleeding.  The patient states that these actually, I think, happened in July 2011 when he had his right ankle surgery infusion.  He says the platelet count went down then but eventually came back.  He feels that this will happen again once he gets over this infection.  PAST MEDICAL HISTORY: 1. Remarkable for hypertension. 2. Diabetes. 3. End-stage renal disease, on hemodialysis. 4. Hyperparathyroidism - secondary. 5.  Dilated cardiomyopathy. 6. Chronic anemia.  ALLERGIES:  None.  MEDICATIONS: 1. Maxipime 2 g Monday, Wednesday and Friday. 2. Zemplar 2 mcg IV. 3. Vancomycin dosed by pharmacy. 4. Dilaudid as needed. 5. Atarax 25 mg q.8 h. p.r.n. 6. Phenergan 12.5 p.o. q.6 h. 7. Ultram 50-100 mg q.6 h. p.r.n. 8. Ambien 5 mg p.o. nightly.  SOCIAL HISTORY:  Remarkable for tobacco use.  There is no alcohol use.  FAMILY HISTORY:  Noncontributory.  There is no obvious hematologic issues in the family.  PHYSICAL EXAMINATION:  GENERAL:  This is an obese black gentleman in no obvious distress. VITAL SIGNS:  Temperature 97.7, pulse 86, respiratory 14, blood pressure 137/71.  HEENT:  Head exam shows normocephalic, atraumatic skull.  He has no ocular or oral lesions.  There is no scleral icterus.  There is no petechiae in the palate. NECK:  He has no adenopathy in his neck. LUNGS:  Clear bilaterally.  There may be some slight decrease in the bases. CARDIAC:  Regular rate and rhythm with a normal S1 and S2.  There are no murmurs, rubs or bruits. ABDOMEN:  Obese and soft.  He has decent bowel sounds.  There is no palpable hepatosplenomegaly. EXTREMITIES:  Some trace edema in his lower legs.  He has the dressing in the right upper arm. NEUROLOGIC:  No focal neurological deficits.  His peripheral smear shows a normochromic normocytic population of red blood cells.  He does have some anisocytosis.  He has some slight microcytic red cells.  There are no target cells.  I have seen no teardrop cells.  There is no schistocytes.  White cells appear normal, morphology maturation.  There is no immature myeloid or lymphoid forms. Platelets are decreased in number.  He has several large platelets. Platelets were well granulated.  IMPRESSION:  Mr. Hentz is a 45 year old gentleman with progressive thrombocytopenia.  I have to suspect that this is from his sepsis and this is secondary to platelet consumption.  I  do not see anything on his blood smear that looks abnormal for hematologic malignancy.  I do not find anything on physical exam that would support any other etiology for the thrombocytopenia.  I am not aware of vancomycin being a big factor with respect to his thrombocytopenia.  I do not think vancomycin can lead to leukopenia.  I think he was on cephalosporins.  Sometimes these can lead to thrombocytopenia.  Again, his blood smear is reassuring as this is not DIC or TTP.  I have to believe that his platelet count will resolve over time.  I do not see that anything else really needs to be done from my point of view.  He is not bleeding.  I would not give him platelets unless he does have some bleeding issues.  I do not see any indication for a bone marrow biopsy.  Again, I would just follow Mr. Scadden platelet count.  Once there is a reversible in the thrombocytopenia, his platelet count gets raised and he likely could be sent home.  While he is thrombocytopenic, I would avoid aspirin and nonsteroidals.  Thank you very much for allowing [place] to see Mr. Stephens.     Rudell Cobb. Marin Olp, M.D.     PRE/MEDQ  D:  08/31/2010  T:  09/01/2010  Job:  [id number]  cc:   Oren Binet, MD  Electronically Signed by Burney Gauze  on 09/10/2010 07:34:39 AM

## 2010-09-17 NOTE — H&P (Signed)
[name], [name]             ACCOUNT NO.:  [id number]  MEDICAL RECORD NO.:  [id number]           PATIENT TYPE:  LOCATION:                                 FACILITY:  PHYSICIAN:  Jana Hakim, M.D. DATE OF BIRTH:  06-03-1966  DATE OF ADMISSION:  08/29/2010 DATE OF DISCHARGE:                             HISTORY & PHYSICAL   PRIMARY CARE PHYSICIAN:  Unassigned.  CHIEF COMPLAINT:  Fever, drainage from dialysis catheter site.  HISTORY OF PRESENT ILLNESS:  This is a 45 year old male with end-stage renal disease on hemodialysis treatments Mondays, Wednesdays, Fridays, who presents with complaints of worsening fever, chills, and drainage from his right upper extremity AV graft site.  The patient states this has been occurring for the past 3 days.  He was previously treated for infection of the graft site with IV antibiotic therapy.  The patient reports having fever and chills, has a temperature of 103.1 in the emergency department.  He also has tachycardia with a heart rate of 122- 141.  The patient was seen and evaluated in the emergency department and blood cultures were performed.  PAST MEDICAL HISTORY:  Significant for: 1. End-stage renal disease on hemodialysis treatments for 6 years. 2. Hypertension. 3. Type 2 diabetes mellitus on insulin therapy. 4. Morbid obesity.  PAST SURGICAL HISTORY: 1. History of a right ankle open reduction and internal fixation and     fusion. 2. Ventral hernia repair. 3. AV graft placements for dialysis.  Medications will need to be further verified.  The patient is on insulin 70/30, 45 units subcu q.a.m. and 50 units subcu q.p.m.  He has no known drug allergies.  SOCIAL HISTORY:  The patient reports smoking 2 cigarettes a day.  He denies any alcohol or illicit drug usage.  FAMILY HISTORY:  Positive for coronary artery disease, hypertension, and diabetes in his mother and his brother also has hypertension.  REVIEW OF SYSTEMS:   Pertinents are mentioned above.  PHYSICAL EXAMINATION FINDINGS:  GENERAL:  This is a morbidly obese 53- year-old [place] American male who is in no discomfort or acute distress. VITAL SIGNS:  Temperature 103.1, blood pressure 158/85, heart rate 122- 141, respirations 19-21, O2 sats 98-99%. HEENT:  Normocephalic, atraumatic.  Pupils equally round, reactive to light.  Extraocular movements are intact.  Funduscopic benign.  There is no scleral icterus.  Nares are patent bilaterally.  Oropharynx is clear. NECK:  Supple.  Full range of motion.  No thyromegaly, adenopathy, jugular venous distention. CARDIOVASCULAR:  Regular rate and rhythm.  No murmurs, gallops, or rubs. LUNGS:  Clear to auscultation bilaterally.  No rales, rhonchi, or wheezes. ABDOMEN:  Positive bowel sounds, soft, nontender, nondistended.  No hepatosplenomegaly. EXTREMITIES:  Without cyanosis, clubbing, or edema.  There is inflammation, however, of the right upper extremity AV graft site. There is also a central draining punctum and the drainage is purulent. NEUROLOGIC:  Nonfocal.  LABORATORY STUDIES:  White blood cell count 12.3, hemoglobin 11.1, hematocrit 35.5, MCV 91.0, platelets 50.  Sodium 131, potassium 4.2, chloride 91, CO2 of 22, BUN 55, creatinine 14.56, and glucose 417.  ASSESSMENT:  A 45 year old male being admitted  with: 1. Sepsis. 2. Cellulitis of the right upper extremity arteriovenous graft. 3. Hyperglycemia. 4. Hypertension. 5. End-stage renal disease on dialysis treatments. 6. Tachycardia.  PLAN:  The patient will be admitted and blood cultures have been ordered.  The patient will be placed on vancomycin and cefepime therapy. IV fluids have been ordered for fluid resuscitation to help improve his tachycardia and fever.  Antipyretic therapy has also been ordered.  The patient will be made n.p.o.  Vascular Surgery, Dr. Trula Slade will see the patient for surgical drainage and I and D of the area.  The  patient will be placed on half-dose insulin therapy at this time since he is n.p.o. Sliding scale insulin coverage has also been ordered for elevated blood sugars.  The patient is a full code and DVT prophylaxis will be ordered as well.     Jana Hakim, M.D.     HJ/MEDQ  D:  08/29/2010  T:  08/29/2010  Job:  [id number]  Electronically Signed by Jana Hakim M.D. on 09/17/2010 08:18:15 PM

## 2010-09-25 ENCOUNTER — Ambulatory Visit (INDEPENDENT_AMBULATORY_CARE_PROVIDER_SITE_OTHER): Payer: Medicare Other | Admitting: Vascular Surgery

## 2010-09-25 DIAGNOSIS — N186 End stage renal disease: Secondary | ICD-10-CM

## 2010-09-25 NOTE — Assessment & Plan Note (Signed)
OFFICE VISIT  [name], [name] DOB:  16-May-1966                                       09/25/2010 [id number]  Patient presents today for follow-up of his AV access.  He had an infected area of AV fistula that was excised by Dr. Adele Barthel approximately on 02/15.  He has had good healing with this large open wound with a good granulating base.  He did have a prior left upper arm AV graft with poor function with recurrent thromboses.  He had excellent use of his upper arm fistula on the right, and it has been present for approximately 6 or 7 years.  I imaged his basilic vein with a SonoSite ultrasound on the left, and he does have a patent basilic vein throughout its course that is a moderate size.  I have recommended that he proceed with basilic vein transposition.  He is scheduled for 03/29 at [facility].  He does have a functioning Diatek catheter.  He understands the potential for nonmaturation of the fistula, and he may require eventual graft placement.    Rosetta Posner, M.D. Electronically Signed  TFE/MEDQ  D:  09/25/2010  T:  09/25/2010  Job:  [id number]

## 2010-09-30 LAB — DIFFERENTIAL
Eosinophils Absolute: 0.2 10*3/uL (ref 0.0–0.7)
Eosinophils Relative: 2 % (ref 0–5)
Lymphs Abs: 1.7 10*3/uL (ref 0.7–4.0)
Monocytes Absolute: 1 10*3/uL (ref 0.1–1.0)

## 2010-09-30 LAB — COMPREHENSIVE METABOLIC PANEL
AST: 13 U/L (ref 0–37)
BUN: 15 mg/dL (ref 6–23)
CO2: 32 mEq/L (ref 19–32)
Chloride: 96 mEq/L (ref 96–112)
Creatinine, Ser: 5.65 mg/dL — ABNORMAL HIGH (ref 0.4–1.5)
GFR calc non Af Amer: 11 mL/min — ABNORMAL LOW (ref >60)
Total Bilirubin: 0.4 mg/dL (ref 0.3–1.2)

## 2010-09-30 LAB — GLUCOSE, CAPILLARY
Glucose-Capillary: 176 mg/dL — ABNORMAL HIGH (ref 70–99)
Glucose-Capillary: 198 mg/dL — ABNORMAL HIGH (ref 70–99)
Glucose-Capillary: 223 mg/dL — ABNORMAL HIGH (ref 70–99)

## 2010-09-30 LAB — CBC
HCT: 31.2 % — ABNORMAL LOW (ref 39.0–52.0)
Hemoglobin: 10.1 g/dL — ABNORMAL LOW (ref 13.0–17.0)
MCH: 29.5 pg (ref 26.0–34.0)
MCH: 29.7 pg (ref 26.0–34.0)
MCV: 89.7 fL (ref 78.0–100.0)
MCV: 91.4 fL (ref 78.0–100.0)
Platelets: 109 10*3/uL — ABNORMAL LOW (ref 150–400)
RBC: 3.11 MIL/uL — ABNORMAL LOW (ref 4.22–5.81)
RBC: 3.41 MIL/uL — ABNORMAL LOW (ref 4.22–5.81)

## 2010-09-30 LAB — WOUND CULTURE: Culture: NO GROWTH

## 2010-09-30 LAB — POCT I-STAT, CHEM 8
Calcium, Ion: 0.94 mmol/L — ABNORMAL LOW (ref 1.12–1.32)
Creatinine, Ser: 10.5 mg/dL — ABNORMAL HIGH (ref 0.4–1.5)
Glucose, Bld: 164 mg/dL — ABNORMAL HIGH (ref 70–99)
Hemoglobin: 9.9 g/dL — ABNORMAL LOW (ref 13.0–17.0)
Sodium: 136 mEq/L (ref 135–145)
TCO2: 30 mmol/L (ref 0–100)

## 2010-09-30 LAB — APTT: aPTT: 33 seconds (ref 24–37)

## 2010-09-30 LAB — PROTIME-INR: INR: 1.06 (ref 0.00–1.49)

## 2010-09-30 LAB — ANAEROBIC CULTURE

## 2010-10-11 ENCOUNTER — Ambulatory Visit (HOSPITAL_COMMUNITY)
Admission: RE | Admit: 2010-10-11 | Discharge: 2010-10-11 | Disposition: A | Discharge status: Home / Self Care | Payer: Medicare Other | Source: Ambulatory Visit | Attending: Vascular Surgery | Admitting: Vascular Surgery

## 2010-10-11 DIAGNOSIS — Z01812 Encounter for preprocedural laboratory examination: Secondary | ICD-10-CM | POA: Insufficient documentation

## 2010-10-11 DIAGNOSIS — F17200 Nicotine dependence, unspecified, uncomplicated: Secondary | ICD-10-CM | POA: Insufficient documentation

## 2010-10-11 DIAGNOSIS — N186 End stage renal disease: Secondary | ICD-10-CM

## 2010-10-11 DIAGNOSIS — I428 Other cardiomyopathies: Secondary | ICD-10-CM | POA: Insufficient documentation

## 2010-10-11 DIAGNOSIS — I120 Hypertensive chronic kidney disease with stage 5 chronic kidney disease or end stage renal disease: Secondary | ICD-10-CM | POA: Insufficient documentation

## 2010-10-11 DIAGNOSIS — E119 Type 2 diabetes mellitus without complications: Secondary | ICD-10-CM | POA: Insufficient documentation

## 2010-10-11 HISTORY — PX: AV FISTULA PLACEMENT: SHX1204

## 2010-10-11 LAB — SURGICAL PCR SCREEN
MRSA, PCR: NEGATIVE
Staphylococcus aureus: NEGATIVE

## 2010-10-11 LAB — GLUCOSE, CAPILLARY: Glucose-Capillary: 131 mg/dL — ABNORMAL HIGH (ref 70–99)

## 2010-10-11 LAB — POCT I-STAT 4, (NA,K, GLUC, HGB,HCT): Sodium: 138 mEq/L (ref 135–145)

## 2010-10-15 NOTE — Op Note (Signed)
  [name], [name]             ACCOUNT NO.:  [id number]  MEDICAL RECORD NO.:  [id number]           PATIENT TYPE:  O  LOCATION:  [facility]                         FACILITY:  [facility]  PHYSICIAN:  Rosetta Posner, M.D.    DATE OF BIRTH:  10-02-1965  DATE OF PROCEDURE:  10/11/2010 DATE OF DISCHARGE:  10/11/2010                              OPERATIVE REPORT   PREOPERATIVE DIAGNOSIS:  End-stage renal disease.  POSTOPERATIVE DIAGNOSIS:  End-stage renal disease.  PROCEDURE:  Left upper arm basilic vein transposition.  SURGEON:  Rosetta Posner, MD  ASSISTANT:  Carlyn Reichert, RNFA.  ANESTHESIA:  General endotracheal.  COMPLICATIONS:  None.  DISPOSITION:  To recovery room, stable.  PROCEDURE IN DETAIL:  The patient was taken to the operating room and placed in supine position where the left arm and left axilla were prepped and draped in the usual sterile fashion.  Ultrasound was used to visualize the basilic vein.  Multiple incisions were made beginning just below the antecubital space and continued up to the axilla.  The patient had a prior Gore-Tex graft in the upper arm.  The vein was of adequate caliber throughout its course.  It did dive deep into the axilla. Brachial artery was exposed with separate incision at the antecubital space and was of good caliber.  A tunnel was created from the level of the brachial artery to the axilla and the basilic vein was brought through this tunnel.  Care was taken not to twist the vein.  The artery was occluded proximally and distally, and was opened with a #11 blade and extended longitudinally with Potts scissors.  The vein was spatulated and sewn end-to-side to the artery with a running 6-0 Prolene suture.  Clamps were removed and good flow was noted.  The wounds were irrigated with saline.  Hemostasis was achieved with electrocautery. Wounds were closed with 3-0 Vicryl on the subcutaneous and subcuticular tissue.  Benzoin and Steri-Strips were  applied.     Rosetta Posner, M.D.     TFE/MEDQ  D:  10/11/2010  T:  10/12/2010  Job:  [id number]  Electronically Signed by Ysabella Babiarz M.D. on 10/15/2010 08:45:31 PM

## 2010-10-23 LAB — RENAL FUNCTION PANEL
Albumin: 3.1 g/dL — ABNORMAL LOW (ref 3.5–5.2)
Albumin: 3.4 g/dL — ABNORMAL LOW (ref 3.5–5.2)
Albumin: 3.5 g/dL (ref 3.5–5.2)
BUN: 36 mg/dL — ABNORMAL HIGH (ref 6–23)
CO2: 25 mEq/L (ref 19–32)
CO2: 27 mEq/L (ref 19–32)
CO2: 32 mEq/L (ref 19–32)
Calcium: 6.9 mg/dL — ABNORMAL LOW (ref 8.4–10.5)
Calcium: 7.8 mg/dL — ABNORMAL LOW (ref 8.4–10.5)
Calcium: 8 mg/dL — ABNORMAL LOW (ref 8.4–10.5)
Calcium: 8.5 mg/dL (ref 8.4–10.5)
Calcium: 8.8 mg/dL (ref 8.4–10.5)
Chloride: 93 mEq/L — ABNORMAL LOW (ref 96–112)
Chloride: 98 mEq/L (ref 96–112)
Creatinine, Ser: 11.7 mg/dL — ABNORMAL HIGH (ref 0.4–1.5)
Creatinine, Ser: 8.63 mg/dL — ABNORMAL HIGH (ref 0.4–1.5)
Creatinine, Ser: 9.94 mg/dL — ABNORMAL HIGH (ref 0.4–1.5)
GFR calc Af Amer: 11 mL/min — ABNORMAL LOW (ref >60)
GFR calc Af Amer: 6 mL/min — ABNORMAL LOW (ref >60)
GFR calc Af Amer: 6 mL/min — ABNORMAL LOW (ref >60)
GFR calc Af Amer: 7 mL/min — ABNORMAL LOW (ref >60)
GFR calc non Af Amer: 5 mL/min — ABNORMAL LOW (ref >60)
GFR calc non Af Amer: 6 mL/min — ABNORMAL LOW (ref >60)
GFR calc non Af Amer: 9 mL/min — ABNORMAL LOW (ref >60)
Glucose, Bld: 108 mg/dL — ABNORMAL HIGH (ref 70–99)
Glucose, Bld: 127 mg/dL — ABNORMAL HIGH (ref 70–99)
Phosphorus: 3.4 mg/dL (ref 2.3–4.6)
Phosphorus: 4.3 mg/dL (ref 2.3–4.6)
Phosphorus: 5.4 mg/dL — ABNORMAL HIGH (ref 2.3–4.6)
Phosphorus: 5.9 mg/dL — ABNORMAL HIGH (ref 2.3–4.6)
Potassium: 3.8 mEq/L (ref 3.5–5.1)
Potassium: 4.3 mEq/L (ref 3.5–5.1)
Sodium: 133 mEq/L — ABNORMAL LOW (ref 135–145)
Sodium: 136 mEq/L (ref 135–145)
Sodium: 136 mEq/L (ref 135–145)
Sodium: 137 mEq/L (ref 135–145)
Sodium: 140 mEq/L (ref 135–145)

## 2010-10-23 LAB — CBC
HCT: 34 % — ABNORMAL LOW (ref 39.0–52.0)
Hemoglobin: 11 g/dL — ABNORMAL LOW (ref 13.0–17.0)
MCHC: 32.3 g/dL (ref 30.0–36.0)
MCHC: 32.5 g/dL (ref 30.0–36.0)
MCV: 84.2 fL (ref 78.0–100.0)
Platelets: 159 10*3/uL (ref 150–400)
Platelets: 164 10*3/uL (ref 150–400)
Platelets: 169 10*3/uL (ref 150–400)
RDW: 20.1 % — ABNORMAL HIGH (ref 11.5–15.5)
WBC: 7.6 10*3/uL (ref 4.0–10.5)

## 2010-10-23 LAB — BASIC METABOLIC PANEL
BUN: 28 mg/dL — ABNORMAL HIGH (ref 6–23)
Calcium: 9.1 mg/dL (ref 8.4–10.5)
GFR calc non Af Amer: 5 mL/min — ABNORMAL LOW (ref >60)
Potassium: 4.2 mEq/L (ref 3.5–5.1)
Sodium: 136 mEq/L (ref 135–145)

## 2010-10-23 LAB — DIFFERENTIAL
Basophils Absolute: 0 10*3/uL (ref 0.0–0.1)
Eosinophils Relative: 5 % (ref 0–5)
Lymphocytes Relative: 25 % (ref 12–46)
Lymphs Abs: 1.9 10*3/uL (ref 0.7–4.0)
Neutro Abs: 4.9 10*3/uL (ref 1.7–7.7)

## 2010-10-23 LAB — GLUCOSE, CAPILLARY
Glucose-Capillary: 105 mg/dL — ABNORMAL HIGH (ref 70–99)
Glucose-Capillary: 121 mg/dL — ABNORMAL HIGH (ref 70–99)
Glucose-Capillary: 128 mg/dL — ABNORMAL HIGH (ref 70–99)
Glucose-Capillary: 130 mg/dL — ABNORMAL HIGH (ref 70–99)
Glucose-Capillary: 133 mg/dL — ABNORMAL HIGH (ref 70–99)
Glucose-Capillary: 144 mg/dL — ABNORMAL HIGH (ref 70–99)
Glucose-Capillary: 151 mg/dL — ABNORMAL HIGH (ref 70–99)
Glucose-Capillary: 186 mg/dL — ABNORMAL HIGH (ref 70–99)

## 2010-10-23 LAB — PROTIME-INR: Prothrombin Time: 14.4 seconds (ref 11.6–15.2)

## 2010-10-23 LAB — POCT I-STAT 4, (NA,K, GLUC, HGB,HCT): Potassium: 4.3 mEq/L (ref 3.5–5.1)

## 2010-10-25 ENCOUNTER — Ambulatory Visit (INDEPENDENT_AMBULATORY_CARE_PROVIDER_SITE_OTHER): Payer: Medicare Other

## 2010-10-25 DIAGNOSIS — N186 End stage renal disease: Secondary | ICD-10-CM

## 2010-10-25 NOTE — Assessment & Plan Note (Signed)
OFFICE VISIT  Ricky, Jefferson DOB:  23-Oct-1965                                       10/25/2010 [id number]  Patient is a 45 year old gentleman who had a left basilic vein transposition done on 10/11/2010.  The patient has done well.  He has had no signs or symptoms of steal.  He does complain of some numbness along the ulnar tract intermittently on that left arm.  He had no numbness, tingling, or pain in his hand.  He has a good thrill and bruit in the basilic vein transposition.  It is easily palpable.  His hand is warm and pink with good sensation and motion.  He will follow up with Dr. Donnetta Jefferson in 6 weeks for final check prior to use of this AV fistula.  Ricky Kearns, PA-C  Rosetta Posner, M.D. Electronically Signed  RR/MEDQ  D:  10/25/2010  T:  10/25/2010  Job:  [id number]

## 2010-10-29 LAB — CBC
HCT: 33.5 % — ABNORMAL LOW (ref 39.0–52.0)
Hemoglobin: 10.4 g/dL — ABNORMAL LOW (ref 13.0–17.0)
MCV: 83.7 fL (ref 78.0–100.0)
Platelets: 236 10*3/uL (ref 150–400)
RDW: 22.2 % — ABNORMAL HIGH (ref 11.5–15.5)

## 2010-10-29 LAB — COMPREHENSIVE METABOLIC PANEL
Albumin: 3.4 g/dL — ABNORMAL LOW (ref 3.5–5.2)
Alkaline Phosphatase: 109 U/L (ref 39–117)
BUN: 31 mg/dL — ABNORMAL HIGH (ref 6–23)
Chloride: 99 mEq/L (ref 96–112)
Creatinine, Ser: 9.08 mg/dL — ABNORMAL HIGH (ref 0.4–1.5)
Glucose, Bld: 139 mg/dL — ABNORMAL HIGH (ref 70–99)
Potassium: 4.2 mEq/L (ref 3.5–5.1)
Total Bilirubin: 0.6 mg/dL (ref 0.3–1.2)
Total Protein: 7.7 g/dL (ref 6.0–8.3)

## 2010-10-29 LAB — APTT: aPTT: 39 seconds — ABNORMAL HIGH (ref 24–37)

## 2010-10-29 LAB — PROTIME-INR
INR: 1.1 (ref 0.00–1.49)
Prothrombin Time: 14.2 seconds (ref 11.6–15.2)

## 2010-10-29 LAB — GLUCOSE, CAPILLARY: Glucose-Capillary: 167 mg/dL — ABNORMAL HIGH (ref 70–99)

## 2010-11-27 NOTE — Consult Note (Signed)
[name], [name]             ACCOUNT NO.:  [id number]   MEDICAL RECORD NO.:  [id number]          PATIENT TYPE:  INP   LOCATION:  6708                         FACILITY:  [facility]   PHYSICIAN:  Maudie Flakes. Hassell Done, M.D.   DATE OF BIRTH:  02-Jun-1966   DATE OF CONSULTATION:  11/22/2008  DATE OF DISCHARGE:                                 CONSULTATION   CHIEF COMPLAINT:  The patient is here for parathyroidectomy.   HISTORY OF PRESENT ILLNESS:  [name] is a 45 year old Black man with  end-stage renal disease (well known to the [facility])  who has been admitted for parathyroidectomy.  The procedure was  performed this morning by Dr. Harlow Asa without complication.  The patient  has woke up from sedation and is in his room on 6700.  He has no  complaints.  He said he was feeling well and was in his normal state of  health prior to this surgery.  He does receive hemodialysis, Monday,  Wednesday, and Friday form the [facility] through  right upper arm AV fistula.   PAST MEDICAL HISTORY:  1. End-stage renal disease (on hemodialysis since June 19, 2004      secondary to diabetes).  2. Type 2 diabetes on insulin.  3. Hypertension.  4. Dilated cardiomyopathy.  5. Secondary hypoparathyroidism.  6. Anemia.  7. Status post right ankle fusion for Charcot joint in August 2008      with re-fusion in January 2010.   MEDICATIONS:  1. 70/30 insulin 25 units in the morning and 35 units at night.  2. Imdur 30 mg daily.  3. Simvastatin 20 mg daily.  4. Nephrovite 1 tablet daily.  5. Sensipar 6 mg b.i.d.  6. Hemodialysis meds include Epogen of 28,000 units TIW with dialysis      and Venofer 100 mg every Wednesday.   PHYSICAL EXAMINATION:  GENERAL:  He is awake, alert, pleasant, in no  acute distress.  HEENT:  He is normocephalic, atraumatic.  Extraocular movements are  intact and sclerae are clear.  NECK:  Midline bandage in place without drainage or blood.  CV:  Regular rate and rhythm without murmur, rub, or gallop.  He has 2+  pluses in his extremities.  LUNGS:  Clear to auscultation bilaterally.  SKIN:  No rashes or lesions.  GI:  Abdomen is soft, nontender, nondistended.  No rebound or guarding.  EXTREMITIES:  No cyanosis, clubbing, or edema.  NEURO:  Grossly intact and his access is a right upper arm AV fistula  which is patent with good thrill and bruits.   LABORATORY DATA:  Preop labs show a hemoglobin of 13.6 and a hematocrit  of 40.  Sodium of 138, potassium 4.3, and glucose 166.   ASSESSMENT AND PLAN:  This is a 45 year old male with end-stage renal  disease and severe secondary hyperparathyroidism.  He is postop day #0  from parathyroidectomy.  1. For his end-stage renal disease, he will continue his hemodialysis      Monday, Wednesday, and Friday.  He was last dialyzed yesterday, and      we  will dialyze him tomorrow per his usual routine.  We will use      3.5 calcium bath with hemodialysis tomorrow, as he will now likely      drop his calcium following his parathyroidectomy.  2. For secondary hyperparathyroidism, he is postop day #0 from a      parathyroidectomy.  We must now watch for hypocalcemia and      hyperkalemia.  We will draw renal function panel at 1800 today and      then again at 0500 and 1700 tomorrow to watch for electrolyte      abnormalities particularly hypocalcemia and hyperkalemia.  3. Hypertension.  He is stable on Imdur and we will continue this      here.  4. Diabetes.  He takes insulin 70/30 at home.  We will do a sliding      scale while he is here for now and adjust as necessary.  5. For his anemia, we will continue his Venofer while he is here in      the hospital.   DISPOSITION:  The patient is going to be observed in the hospital for  several days to ensure that his electrolytes do not change and cause him  any secondary effects.      Orland Mustard, MD  Electronically Signed       Maudie Flakes. Hassell Done, M.D.  Electronically Signed    LM/MEDQ  D:  11/22/2008  T:  11/23/2008  Job:  [id number]

## 2010-11-27 NOTE — Op Note (Signed)
NAMEJOSEPHMICHAEL, ADONA             ACCOUNT NO.:  [id number]   MEDICAL RECORD NO.:  [id number]          PATIENT TYPE:  INP   LOCATION:  6708                         FACILITY:  [facility]   PHYSICIAN:  Earnstine Regal, MD      DATE OF BIRTH:  04-07-1966   DATE OF PROCEDURE:  11/22/2008  DATE OF DISCHARGE:                               OPERATIVE REPORT   PREOPERATIVE DIAGNOSES:  1. Secondary hyperparathyroidism.  2. End-stage renal disease.   POSTOPERATIVE DIAGNOSES:  1. Secondary hyperparathyroidism.  2. End-stage renal disease.   PROCEDURES:  1. Total parathyroidectomy.  2. Autotransplantation, parathyroid tissue, left brachioradialis      muscle.   SURGEON:  Earnstine Regal, MD, FACS   ASSISTANT:  Orson Ape. Rise Patience, MD, FACS   ANESTHESIA:  General per Dr. Leda Quail.   ESTIMATED BLOOD LOSS:  Minimal.   PREPARATION:  ChloraPrep.   COMPLICATIONS:  None.   INDICATIONS:  The patient is a 45 year old black male from [place],  [place].  He is referred by Dr. Corliss Parish for  parathyroidectomy for management of secondary hyperparathyroidism.  The  patient has end-stage renal disease due to diabetes mellitus.  He has  been on hemodialysis for 4 years.  The patient has biochemical evidence  of hyperparathyroidism.  He has developed orthopedic issues.  He has  some cutaneous changes.  He now comes to surgery for parathyroidectomy  with autotransplantation.   BODY OF REPORT:  Procedure was done in OR #16 at the [facility].  The patient was brought to the operating room and  placed in supine position on the operating room table.  Following  administration of general anesthesia, the patient is positioned and then  prepped and draped in the usual strict aseptic fashion.  After  ascertaining that an adequate level of anesthesia had been achieved, a  Kocher incision was made with a #15 blade.  Dissection was carried  through subcutaneous tissues.   Hemostasis was obtained with the  electrocautery.  Platysma was divided with the electrocautery.  Skin  flaps were elevated cephalad and caudad from the thyroid notch to the  sternal notch.  A Mahorner self-retaining retractor was placed for  exposure.  Strap muscles were incised in the midline.  Dissection was  begun on the left side.  Thyroid lobe was mobilized.  Venous tributaries  were divided between Ligaclips with the electrocautery.  Exploration  inferiorly demonstrates an enlarged parathyroid gland adherent to the  underlying trachea.  This was gently dissected out, avoiding the  recurrent nerve.  Vascular tributaries were divided between small  Ligaclips.  Gland was completely excised.  A fragment of the gland was  submitted to pathology for frozen section biopsy which confirms  parathyroid tissue.  Remainder of the gland was placed in iced saline on  the back table.   Further exploration on the left eventually reveals a small relatively  normal size gland in the superior position posterior to the superior  pole.  This was gently dissected out.  Again, vascular tributaries were  divided between small Ligaclips.  The  superior gland was excised in its  entirety.  It was relatively small and therefore the entire gland is  submitted to pathology for review.  Dr. Susanne Greenhouse confirms parathyroid  tissue at the superior position.  Dry pack was placed in the left neck.   Next, we turned our attention to the right thyroid lobe.  Right thyroid  lobe was moderately larger than the left.  It was gently mobilized.  There are no discrete nodules.  Venous tributaries were divided between  Ligaclips.  Gland was rolled anteriorly.  Exploration inferiorly reveals  an enlarged parathyroid gland in the tracheoesophageal groove.  It was  gently mobilized taking care to avoid the recurrent nerve.  Vascular  tributaries were divided between Ligaclips.  Gland was excised in its  entirety.  A fragment  of the gland was submitted to pathology for frozen  section.  This confirms parathyroid tissue.  Remainder of the gland was  placed in iced saline on the back table.   Further dissection on the right reveals an enlarged parathyroid gland  just posterior to the superior pole of the right thyroid lobe.  It was  gently dissected out.  It was moderately enlarged but appears otherwise  normal in texture.  It was excised in its entirety using Ligaclips to  secure the vascular pedicle.  A fragment of the gland was submitted to  pathology and Dr. Susanne Greenhouse again confirms parathyroid tissue.  The  remainder of the right superior gland was placed in iced saline on the  back table for use later for transplantation.   Neck was explored.  Good hemostasis was noted.  Surgicel was placed in  the operative field bilaterally.  Strap muscles were reapproximated in  the midline with interrupted 3-0 Vicryl sutures.  Platysma was closed  with interrupted 3-0 Vicryl sutures.  Skin was closed with a running 4-0  Monocryl subcuticular suture.  The wound was washed and dried and  benzoin and Steri-Strips were applied.  Sterile dressings were applied.   Next, the left forearm was placed at 90 degrees on an arm board.  The  area of the brachioradialis muscle was then prepped and draped in the  usual strict aseptic fashion.  After ascertaining that an adequate level  of anesthesia had been maintained, a 5-cm incision was made with a #15  blade.  Dissection was carried through subcutaneous tissues with the  electrocautery.  Subcutaneous flaps were developed circumferentially.  Weitlaner retractor was placed for exposure.  Right superior parathyroid  gland was selected.  It was sectioned into ten 1-mm fragments which were  kept in iced saline.  Remainder of the gland was submitted to pathology.   These 10 fragments were then implanted into the left brachioradialis  muscle above making an incision with a #15 blade  in the muscle fascia,  creating a muscular cavity, inserting a fragment of parathyroid gland,  and closing the overlying fascia with interrupted 4-0 Prolene sutures.  This exercise was repeated 10 times.  Good hemostasis was noted.  Subcutaneous tissues were closed with interrupted 3-0 Vicryl sutures.  Skin was closed with a running 4-0  Monocryl subcuticular suture.  Wound was washed and dried and benzoin  and Steri-Strips were applied.  Sterile dressings were applied.  The  patient was awakened from anesthesia and brought to the recovery room in  stable condition.  The patient tolerated the procedure well.      Earnstine Regal, MD  Electronically Signed  TMG/MEDQ  D:  11/22/2008  T:  11/23/2008  Job:  [id number]   cc:   Louis Meckel, M.D.

## 2010-11-27 NOTE — Discharge Summary (Signed)
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   PROCEDURE:  Right tibial to calcaneal fusion with fusion of the ankle  joint and subtalar joint. Discharged to home in stable condition. Follow-  up in the office in 2 weeks.   HISTORY OF PRESENT ILLNESS:  The patient is a 45 year old gentleman with  Charcot collapse of the right hind foot and ankle secondary to diabetic  insensate neuropathy.  The patient has failed conservative care and  presents at this time for surgical intervention due to a risk of skin  breakdown, ulceration and infection. The risks and benefits were  discussed.  The patient states he understands and wishes to proceed at  this time.   HOSPITAL COURSE:  The patient's hospital course was essentially  unremarkable.  He underwent a tibial calcaneal fusion of the right ankle  and right subtalar joint on February 12, 2007. He received Kefzol for  infection prophylaxis.  Postoperatively, the patient progressed slowly  with physical therapy. He had a T-max of 101 on February 13, 2007. On  postoperative day one, the patient was started on incentive spirometers.  The patient continued to improve, still had a low grade temperature on  August 1. The patient was discharged to home in stable condition on  August 3 with follow-up in the office in 2 weeks.      Newt Minion, MD  Electronically Signed     MVD/MEDQ  D:  04/15/2007  T:  04/15/2007  Job:  [id number]

## 2010-11-27 NOTE — Op Note (Signed)
[name], [name]             ACCOUNT NO.:  [id number]   MEDICAL RECORD NO.:  [id number]          PATIENT TYPE:  INP   LOCATION:  2550                         FACILITY:  [facility]   PHYSICIAN:  Newt Minion, MD     DATE OF BIRTH:  11-19-1965   DATE OF PROCEDURE:  02/12/2007  DATE OF DISCHARGE:                               OPERATIVE REPORT   PREOPERATIVE DIAGNOSIS:  Charcot collapse, right hindfoot, with lateral  malleolar ulceration.   POSTOPERATIVE DIAGNOSIS:  Charcot collapse, right hindfoot, with lateral  malleolar ulceration.   PROCEDURE:  Tibial calcaneal fusion with a Synthes retrograde nail 10 x  180 mm locked proximally 32 mm, locked distally with an 80 mm spiral  blade, excision of the Charcot ulcer.   SURGEON:  Newt Minion, M.D.   ANESTHESIA:  General.   ESTIMATED BLOOD LOSS:  Minimal.   ANTIBIOTICS:  2 grams of Kefzol.   DRAINS:  None.   COMPLICATIONS:  None.   TOURNIQUET TIME:  Esmarch at the ankle for approximately 32 minutes.   DISPOSITION:  To the PACU in stable condition.   INDICATIONS FOR PROCEDURE:  The patient is a 45 year old gentleman with  diabetic insensate neuropathy with a Charcot collapse with complete  destruction of the hindfoot with extrusion of the talus and lateral  ulceration.  The patient has undergone appropriate conservative care and  due to failure of conservative care, the patient presents at this time  for internal fixation.  The risks and benefits were discussed including  infection, neurovascular injury, nonhealing of the wounds, need for  additional surgery, potential for amputation.  The patient states he  understands and wished to proceed at this time.   DESCRIPTION OF PROCEDURE:  The patient was brought to OR room 3 and  underwent a general anesthetic.  After an adequate level of anesthesia  was obtained, the patient was placed lateral on the operating table and  the right lower extremity was prepped using DuraPrep  and draped into a  sterile field.  A lateral incision was made along the fibula down to the  subtalar joint.  The lateral aspect of the fibula was resected using an  oscillating saw.  The talus was then identified. The talus was extruded  and in multiple fragments.  These fragments were all removed.  The wound  was irrigated with normal saline.  The peroneal tendons were intact.  An  incision was made plantarly and a guidewire was inserted through the  calcaneus into the tibia.  C-arm fluoroscopy verified reduction.  This  was then over drilled with the drill. A guidewire was inserted from the  calcaneus into the tibia and the tibia was sequentially reamed up to 11  mm.  The 10 x 180 mm nail was inserted from the calcaneus into the  tibia.  This was advanced appropriately and then the guidewire was  inserted posteriorly for the spiral blade.  This measured 80 mm.  An 80  mm spiral blade was inserted.  The guide was then switched and a screw  was inserted lateral to medial to lock the  nail proximally.  A 32 mm  screw was chosen.  The jig was removed and the locking bolt was placed  into the nail.  C-arm fluoroscopy verified reduction in both AP and  lateral planes.  His foot was at 90 degrees.  The wound was again  irrigated with normal saline.  The large mass of the bone void was  filled with 20 mL of Grafton, a combination of generalized bone matrix,  and chips.  The wound was irrigated continuously throughout the case.  The subcu tissue was then closed using 2-0 Vicryl.  The previous Charcot  ulcer was completely excised.  The skin was closed using approximating  staples.  The wound was covered with Adaptic orthopedic sponges, sterile  Webril, and a Coban dressing.  The patient was extubated and taken to  the PACU in stable condition.  We will place him in a fracture boot,  start physical therapy, non weight bearing on the right.      Newt Minion, MD  Electronically  Signed     MVD/MEDQ  D:  02/12/2007  T:  02/12/2007  Job:  [id number]

## 2010-11-27 NOTE — Op Note (Signed)
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   PROCEDURE:  1. Removal of deep hardware.  2. Revision fusion of tibial calcaneal fusion on the right.   SURGEON:  Newt Minion, M.D.   ANESTHESIA:  General.   ESTIMATED BLOOD LOSS:  300 mL.   ANTIBIOTICS:  1 gram of Kefzol.   DRAINS:  None.   COMPLICATIONS:  None.   TOURNIQUET TIME:  Esmarch of the calf for approximately 60 minutes.   DISPOSITION:  To PACU in stable condition.   INDICATIONS FOR PROCEDURE:  The patient is a 45 year old gentleman with  end stage renal disease, diabetic neuropathy, status post Charcot  collapse on the right.  The patient has undergone tibial calcaneal  fusion and has had complete dissolution of the talus with an unstable  tibial calcaneal fusion.  The patient has undergone prolonged  conservative treatment and has a persistent unstable tibial calcaneal  fusion was active Charcot process and presents at this time for revision  surgery.  The risks and benefits were discussed including infection,  neurovascular injury, persistent pain, and potential for an amputation.  The patient states he understands and wished to proceed at this time.   DESCRIPTION OF PROCEDURE:  The patient is brought to OR room 4 and  underwent a general anesthetic.  After an adequate level of anesthesia  was obtained, the patient was placed in the left lateral decubitus  position with the right side up and the right lower extremity  was  prepped using DuraPrep and draped into a sterile field.  An Esmarch was  wrapped around the calf for tourniquet control.  The previous lateral  posterior calcaneus and plantar calcaneal incisions were used.  The  locking bolt was removed from the distal aspect of the nail. The locking  screw was removed from the proximal aspect of the nail. The spiral blade  was removed and the plate was removed.  The wound had a significant  amount of fibrinous tissue. Cultures were obtained and gram stains were  negative for infection.  There was no purulence, no odor, no evidence  infection.  The fibrinous tissue was removed.  The wound was irrigated  with pulse lavage and the canal was reamed to a size 14 for a 13 mm  nail.  The 13 mm nail was inserted which was 13 x 240 mm.  This was then  locked posteriorly with two Synthes screws which were 125 mm in length.  This lie from the calcaneus into the mid foot.  The proximal  interlocking screw was placed and this was  36 mm in length.  The foot  was placed in neutral alignment to the knee and the previous area of the  talus was packed with the VITOSS strips 25 x 100 x2.  The Esmarch was  released after approximately 60 minutes.  Hemostasis was obtained.  The  wounds were closed using 2-0 nylon.  The wounds were covered Adaptic,  orthopedic sponges, ABD dressing, Webril, and a posterior sugar tong  splint were applied.  A Coban dressing was wrapped.  The plan is for  discharge to home once stable to ambulation.  The patient was then  extubated and taken to the PACU in stable condition.  The plan is for  dialysis tomorrow.  Follow up in the office in two weeks.      Newt Minion, MD  Electronically Signed     MVD/MEDQ  D:  07/30/2007  T:  07/30/2007  Job:  [id number]

## 2010-11-27 NOTE — Op Note (Signed)
[name], [name]             ACCOUNT NO.:  [id number]   MEDICAL RECORD NO.:  [id number]          PATIENT TYPE:  AMB   LOCATION:  SDS                          FACILITY:  [facility]   PHYSICIAN:  Newt Minion, MD     DATE OF BIRTH:  [birth year]   DATE OF PROCEDURE:  07/21/2008  DATE OF DISCHARGE:  07/21/2008                               OPERATIVE REPORT   PREOPERATIVE DIAGNOSIS:  Painful deep retained hardware with ulceration.   POSTOPERATIVE DIAGNOSIS:  Painful deep retained hardware with  ulceration.   SECONDARY DIAGNOSIS:  Charcot diabetic insensate neuropathy status post  tibiocalcaneal fusion.   PROCEDURE:  1. Removal of deep retained hardware right foot.  2. Irrigation debridement right foot wounds with pulsatile lavage, 3      L.   SURGEON:  Newt Minion, MD   ANESTHESIA:  General.   ESTIMATED BLOOD LOSS:  Minimal.   ANTIBIOTICS:  Kefzol 2 g.   DRAINS:  None.   COMPLICATIONS:  None.   TOURNIQUET TIME:  None.   DISPOSITION:  To PACU in stable condition.   INDICATIONS FOR PROCEDURE:  The patient is a 45 year old gentleman with  diabetic insensate neuropathy status post Charcot collapse of internal  fixation with revision fixation.  The patient has developed plantar  ulceration from the hardware and presents at this time for removal of  deep retained hardware.  Risks and benefits were discussed including  infection, neurovascular injury, persistent pain, progressive Charcot  arthropathy potential for transtibial amputation.  The patient states he  understands and wished to proceed at this time.   DESCRIPTION OF PROCEDURE:  The patient was brought to the OR and  underwent a general LMA anesthetic.  After adequate level of anesthesia  obtained, the patient's right lower extremity was prepped using DuraPrep  and draped into a sterile field.  The plantar wound was extended  longitudinally.  The heel insertion site was opened and the plantar  aspect of the  insertion site nail were also opened.  With the screws  identified, the screw was removed from the posterior aspect of the heel  x2 these were two large screws.  All three wounds were irrigated with  pulsatile lavage.  The IM nail was left in place.  The interlocking  screws were removed.  The IM nail was not a prominent cause of the  ulceration.  The nail plates did provide some residual stabilization of  the Charcot arthropathy.  After irrigation with pulsatile lavage, the  wounds were loosely closed with 2-0 nylon.  The plantar wound was  allowed to  drain.  The wounds were covered with Adaptic orthopedic sponges, ABD  dressing, Webril and Coban.  The patient was extubated and taken to the  PACU in stable condition.  Plan for discharge to home.  Follow up in the  office in 1 week.      Newt Minion, MD  Electronically Signed     MVD/MEDQ  D:  07/21/2008  T:  07/22/2008  Job:  [id number]

## 2010-11-27 NOTE — Assessment & Plan Note (Signed)
[facility] OFFICE NOTE   SIMPSON, BAUMERT                    MRN:          [id number]  DATE:02/17/2007                            DOB:          [birth year]    CHIEF COMPLAINT:  1. Heme positive stool.  2. Iron deficiency anemia.   ASSESSMENT:  A 45 year old African-American man that is found to have 2  of 3 stools Hemoccult positive, a ferritin of 20 and an anemia with a  hemoglobin down in the 8 range.  He has end-stage renal disease from  hypertension.  He has not had any overt signs of bleeding or other GI  symptoms except occasional heartburn.   RECOMMENDATIONS AND PLAN:  He needs to have a colonoscopy and upper GI  endoscopy given his overall situation.  He recently had an ankle fusion  and is nonweight bearing, so I do not think he can really prep for a  colonoscopy well at this time.  I am going to set him up for an EGD  colonoscopy in approximately 1-1/2 to 2 weeks.  Hopefully, by that time  he will be weight bearing.  He has followup with Dr. Sharol Given later this  week.  The risks, benefits and indications are explained.  He  understands and agrees to proceed.   If he is still nonweight bearing he is to let us know and I may need to  put him in the hospital the night before to preparation him.   HISTORY:  As above, this 45 year old African-American has problems.  See  my Medical History Form for full details.  He is referred by Dr.  Moshe Cipro for evaluation of this problem.  He recently had 2 units of  packed red cells and his hemoglobin went from the 8 to 10 range.  He has  had no symptoms of dyspnea or weakness or shortness of breath.   PAST MEDICAL HISTORY:  1. Hypertension.  2. Diabetes mellitus.  3. Peripheral vascular disease.  4. Dialysis for 2 years for end-stage renal disease.  5. Umbilical hernia repair.  6. Right ankle fusion within the past couple of weeks.  7. Iron  deficiency anemia.  8. Secondary hyperparathyroidism.  9. 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  10.Diastolic dysfunction.   DRUG ALLERGIES:  None known.   MEDICATIONS:  1. NovoLog 70/30 twice daily.  2. Hydralazine two twice a day.  3. Isosorbide 30 mg daily.  4. Caduet 10/20 mg daily.  5. Metoprolol 100 mg b.i.d.  6. Nephro-Vite daily.  7. Fosinopril? daily.  8. Hydrocodone p.r.n.   DRUG ALLERGIES:  None known.   FAMILY HISTORY/SOCIAL HISTORY/REVIEW OF SYSTEMS:  See my Medical History  Form for full details.  There is no pertinent GI history, no colon  cancer, etc.  He is currently separated.  He dialyzes Monday, Wednesday,  Friday.  Occasional cigarettes.  He is considering transplantation.  See  Medical History Form for further details.   PHYSICAL EXAM:  Height 6 feet 1 inch, weight 251 pounds, blood pressure  148/80, pulse 92.  This is a  well-developed, well-nourished black man in  no acute distress.  EYES:  Anicteric.  MOUTH:  Free of lesions.  Teeth in fair repair.  NECK:  Supple without thyromegaly or mass.  CHEST:  Clear.  HEART:  S1 S2, no murmurs, rubs or gallops.  ABDOMEN:  Soft, nontender, without organomegaly or masses.  There is a  scar from previous umbilical hernia repair.  RECTAL EXAM:  Deferred.  LYMPHATIC:  No neck or supraclavicular nodes palpated.  LOWER EXTREMITIES:  Free of edema on the left.  He has a brace/splint on  the right lower extremity covering the foot, ankle and leg.  There is an  AV fistula in the right with a thrill, this is in the right upper  extremity.  There is a failed AV fistula in the left upper extremity in  the arm.  He is alert and oriented x3.  There is no acute rash in the skin.   I appreciate the opportunity to care for this patient.  I have reviewed  dialysis records, problem list and the laboratory studies, medication  list.  Note, his insulin is 55 units morning, 60 units evening.     Gatha Mayer, MD,FACG  Electronically Signed    CEG/MedQ  DD: 02/17/2007  DT: 02/17/2007  Job #: [id number]   cc:   Louis Meckel, M.D.  Newt Minion, MD

## 2010-11-30 NOTE — H&P (Signed)
NAME:  [name], [name]                       ACCOUNT NO.:  [id number]   MEDICAL RECORD NO.:  [id number]                   PATIENT TYPE:  INP   LOCATION:  6710                                 FACILITY:  [facility]   PHYSICIAN:  Sol Blazing, M.D.             DATE OF BIRTH:  [birth year]   DATE OF ADMISSION:  03/05/2003  DATE OF DISCHARGE:                                HISTORY & PHYSICAL   REASON FOR ADMISSION:  Right groin abscess.   HISTORY OF PRESENT ILLNESS:  This is a 45 year old black male with chronic  renal failure secondary to hypertension and diabetes mellitus who was  hospitalized on August 3 through February 28, 2003, with pulmonary edema,  ventilator-dependent respiratory failure, acute-on-chronic renal failure  requiring CVVHD (continuous venovenous hemodialysis) via right groin  catheter, normal coronary arteries status post cardiac catheterization via  right groin, and severe, nonobstructive cardiomyopathy with an ejection  fraction of 20-30%.  Today, he presented to the emergency room complaining  of severe right groin pain, swelling and increased warmth starting the day  after discharge. As mentioned earlier, he had both a dialysis catheter and a  cardiac catheterization done via the right groin.  He states he has had  fever and chills but otherwise feels okay, eating fine, no nausea or  vomiting, but he does have an awful taste in his mouth.  Today on admission,  his BUN and creatinine is stable from discharge at 108 and 4.9 respectively.   ALLERGIES:  No known drug allergies.   CURRENT MEDICATIONS:  1. Metoprolol 50 mg b.i.d.  2. Digoxin 0.125 mg Monday, Wednesday and Friday.  3. Lasix 160 mg b.i.d.  4. Hydralazine 75 mg q.i.d.  5. Insulin 70/30 20 units b.i.d.  6. Imdur 60 mg daily.  7. Aspirin 81 mg daily.  8. Protonix 40 mg daily.   PAST MEDICAL HISTORY:  1. Severe hypertension.  2. Insulin-dependent type 2 diabetes mellitus.  3. Chronic renal failure  secondary to 1 and 2.  4. Congestive heart failure secondary to hypertensive cardiomyopathy.  5. Normocytic anemia of chronic disease.  6. Tobacco abuse.  7. Acute upper respiratory infection last hospitalization.  8. Ventilator dependent respiratory failure secondary to pulmonary edema.  9. Normal coronary arteries with moderate-to-severe systemic hypertension     and increased left ventricular end-diastolic pressures.  10.      Status post cardiac catheterization on February 21, 2003, Dr. Kirk Ruths.   FAMILY HISTORY/ SOCIAL HISTORY:  Please see admission of February 15, 2003.    REVIEW OF SYSTEMS:  1. Positive for fever and chills and right groin pain.  2. Negative for nausea, vomiting, abdominal pain, chest pain, shortness of     breath, cough, dysuria.   PHYSICAL EXAMINATION:  VITAL SIGNS:  Temperature 100.9, blood pressure  138/72, pulse 98, respirations 18.  GENERAL:  A well-developed, well-nourished black male, in moderate  discomfort due to right groin pain.  He is otherwise awake, alert,  appropriate and oriented x3.  HEENT:  Normocephalic, atraumatic.  No facial edema.  NECK:  Supple.  LUNGS:  Clear to auscultation.  HEART:  Regular rate and rhythm, no rub.  ABDOMEN:  Positive bowel sounds, soft, nontender, nondistended.  EXTREMITIES:  Right groin with approximately 15 cm in diameter tender,  indurated exquisite, tender and erythematous area with two superficial  pustules.  Unable to express any drainage.  No peripheral edema.  No  asterixis.   LABORATORY DATA:  Labs on admission, white count 19,500, hemoglobin 7.9 (8.5  at discharge), hematocrit 23.5, potassium 4.5, sodium 133, chloride 104, CO2  19, glucose 162, BUN 108, creatinine 4.8.  LFT's are within normal limits.  Calcium 9.4.  Phosphorus 6.3.   ASSESSMENT/PLAN:  1. Right groin abscess probably secondary to catheterization last     hospitalization.  Plan to obtain blood cultures, check a CT of the right      groin to rule out abscess, use IV antibiotics and probable surgical     consultation for irrigation and drainage pending CT results of the right     groin.  2. Chronic renal failure.  BUN and creatinine stable from last week.  3. Severe hypertension.  Continue Hydralazine, metoprolol and Lasix.  4. Insulin-dependent type 2 diabetes mellitus.  Continue b.i.d. 70/30     insulin.  5. Severe hypertensive cardiomyopathy.  Continue digoxin and Imdur along     with antihypertensives.      Nonah Mattes, P.A.                      Sol Blazing, M.D.    RRK/MEDQ  D:  03/05/2003  T:  03/06/2003  Job:  [id number]   cc:   Kirk Ruths, M.D.   [facility]

## 2010-11-30 NOTE — Discharge Summary (Signed)
NAME:  [name], [name]                       ACCOUNT NO.:  [id number]   MEDICAL RECORD NO.:  [id number]                   PATIENT TYPE:  INP   LOCATION:  [id number]                                 FACILITY:  [facility]   PHYSICIAN:  Henderson Cloud, M.D.                  DATE OF BIRTH:  22-May-1966   DATE OF ADMISSION:  02/15/2003  DATE OF DISCHARGE:  02/28/2003                                 DISCHARGE SUMMARY   CONTINUITY CLINIC:  [facility].   CONTINUITY NEPHROLOGIST:  Louis Meckel, M.D.   CONTINUITY CARDIOLOGIST:  Kirk Ruths, M.D.   CONSULTATIONS:  1. Kirk Ruths, M.D., (cardiology).  Bayshore Deterding, M.D., (renal).   DISCHARGE DIAGNOSES:  1. Congestive heart failure secondary to hypertension.  2. Hypertension.  3. Diabetes mellitus type 2.  4. Acute renal failure on chronic renal failure.  5. Normocytic anemia.  6. Acute upper respiratory tract infection.  7. Smoking.   DISCHARGE MEDICATIONS:  1. Metoprolol 50 mg p.o. b.i.d.  2. Digoxin 0.125 mg Monday, Wednesday, Friday.  3. Lasix 160 mg p.o. b.i.d.  4. Hydralazine 75 mg p.o. q.i.d.  5. Insulin 70/30 20 units q.a.m., 20 units q.p.m.  6. Imdur 60 mg p.o. daily.  7. Aspirin 81 mg p.o. daily.  8. Protonix 40 mg p.o. daily.   PROCEDURES PERFORMED:  1. February 15, 2003, portable chest x-ray - cardiomegaly, diffuse pulmonary     edema.  2. February 16, 2003, echocardiogram  showed dilated left ventricle, EF 20 to     30%, and aortic valve calcification, mitral annular calcification.  3. February 16, 2003, portable chest x-ray - decreased lung volumes, otherwise     unchanged from previous portable chest x-ray.  4. February 16, 2003, portable chest x-ray - Swan-Ganz catheter tip in     pulmonary outflow tract, no pneumothorax.  5. February 17, 2003, portable chest x-ray - significant decrease in pulmonary     edema with improved aeration in right lung base.  Left lower lobe still  has consolidation versus atelectasis.  6. February 21, 2003, cardiac catheterization -     a. Moderate to severe systemic hypertension, increased LVEDP.     b. No evidence of coronary atherosclerotic disease.     c. Normal coronary arteries.   HISTORY OF PRESENT ILLNESS:  The patient is a 45 year old male with a past  medical history of diabetes mellitus, hypertension, and chronic renal  insufficiency who was evaluated for chest pain and massive pulmonary edema.  The patient had been seeing his nephrologist for recently worsening renal  insufficiency and pulmonary edema.  During the past two weeks, the patient's  dyspnea had become more pronounced, and was associated with orthopnea, PND,  and pedal edema.  The patient also had intermittent chest pain.  Blood  pressure at the time of arrival to emergency department was 150/116.   Physical examination  on arrival was notable for jugular venous distension,  tachycardic rate with a gallop noted.  The patient also had 1 to 2+ ankle  edema bilaterally.   ADMISSION LABORATORY DATA:  Hemoglobin 13.1, hematocrit 38.4, white blood  cell count 12.6, platelet count 359.  Potassium 4.4, BUN 64, creatinine 3.7.  CK 590, MB 9.4, troponin I 0.06.  BNP 2730.  Urinalysis was significant for  greater than 300 mg protein.   EKG from admission showed sinus tachycardia with biatrial enlargement and a  possibility of prior septal infarction.  There were nonspecific ST changes,  but no pathologic Q-waves.   HOSPITAL COURSE:  PROBLEM #1 -  CONGESTIVE HEART FAILURE:  The patient's  frank pulmonary edema on presentation was quite worrisome.  The patient did  not respond to a total of 140 mg IV Lasix given in the emergency department,  and was unstable.  The patient was intubated and started on CVVHD.  Echocardiogram  on hospital day #2 revealed ejection fraction of 20 to 30%.  After the patient was transferred from the critical care unit to the regular  floor on  hospital day #4, the patient's regular Lasix dose was restarted.  Urine output showed good diuresis, and resolution of edema.  Since the  patient had a low EF and risk factors significant for coronary  atherosclerotic disease, cardiology recommended a cardiac catheterization.  Catheterization showed no evidence of CAD, normal coronary arteries, and a  moderate to severe degree of systemic hypertension.  By hospital day #7, the  patient was euvolemic and returned to a Lasix dose of 160 mg p.o. b.i.d.  The patient was discharged on this dose of Lasix and encouraged to monitor.  Hence, the patient's congestive heart failure is most likely derived from  isolated systemic hypertension.   PROBLEM #2 -  HYPERTENSION:  The patient's blood pressure on arrival to the  emergency department was 150/116.  The patient was started on CVVHD, and  became hypotensive.  Pressors were started, and CVVHD was stopped on  hospital day #3.  Elevated blood pressures to the 150s/80s lead to  recommendation by cardiologist to start hydralazine 25 mg q.6h. and Coreg  3.125 mg b.i.d.  Persistent elevation of blood pressures required higher  doses of these medications, and the patient was normotensive with Coreg 12.5  mg b.i.d., Lasix 160 mg b.i.d., and hydralazine 75 mg q.i.d.   PROBLEM #3 -  DIABETES MELLITUS:  The patient's blood sugar was 391 on  admission.  Insulin regular 20 units x1 was given as a drip.  Following  hemodialysis, the patient was started on a sliding scale of insulin and  given Lantus 15 units q.h.s. subcu.  Since the patient was still having  blood pressures in the 200 and 300s, this was increased to Lantus 25 units  q.h.s.  Given the higher cost of Lantus, the patient was switched to insulin  70/30 in order to find a more inexpensive home regimen.  On the final day of  the patient's hospital stay, the patient was on insulin 70/30 at 20 units q.a.m. and q.p.m.  This insulin dose resulted in  blood sugars ranging from  91 to the low 200s.  The patient was taught how to use subcu insulin, and  given prescription for insulin ______.  The patient will follow up as an  outpatient in the [facility].  The patient reported  a recent eye exam, and will need routine foot check.  PROBLEM #4 -  ACUTE RENAL FAILURE ON CHRONIC RENAL FAILURE:  On admission,  the patient was oliguric, acidemic, and volume overloaded.  Initial BUN of  64 and creatinine 3.7 likely represented an acute process on top of  underlying chronic renal insufficiency.  The patient was started on CVVHD  for approximately two hospital days, and the creatinine on hospital day #3  was 3.7.  Resuming Lasix allowed for adequate diuresis on hospital day #4.  Since the patient had a high risk of CAD and cardiology recommended a  catheterization, the risks and benefits of this nephrotoxic study were  explained to the patient.  The patient decided to proceed, and Mucomyst was  given before the procedure.  The patient's creatinine increased from 3.6 to  4.8 after the catheterization.  Subsequently, the patient's creatinine  plateaued at 5.4 and was 4.9 on discharge.  The patient's underlying chronic  renal disease may soon require dialysis.  Further discussions regarding  dialysis, venous access, and possible transplant will be deferred to the  patient's regular nephrologist, Dr. Moshe Cipro.  The patient has a follow-  up appointment with Dr. Moshe Cipro scheduled on March 16, 2003.   PROBLEM #5 -  NORMOCYTIC ANEMIA:  The patient had had a hemoglobin of 13.1  on admission.  Subsequent studies showed a ferritin of 249, FV 31, TIBC 20,  MCV 92.  The patient's hemoglobin during hospital course was in the 9s and  10s.  The patient was started on Aranesp.  However, the prohibitively high  cost of this medicine will make it difficult for the patient to continue it  as an outpatient.  Further options  regarding financing of Aranesp will be  discussed with the patient's primary nephrologist.   PROBLEM #6 -  ACUTE UPPER RESPIRATORY TRACT INFECTION:  During hospital day  #8, the patient complained of a sore throat, tender lymphadenopathy, and had  temperature increases to 100.5.  A rapid strep test was negative, so  symptomatic treatment with Tylenol No. 3 seemed most appropriate.   PROBLEM #7 -  SMOKING:  The patient has as 50-pack-year smoking history, and  the patient was counseled about quitting.   DISPOSITION:  The patient will return to [facility] on Wednesday, March 02, 2003, for a lab follow-up to get  a BMP drawn.  The patient will follow up with [facility] on Friday, March 04, 2003, at 3:30 p.m.  The patient will follow up with Dr. Moshe Cipro at  [facility] on Wednesday, March 16, 2003, at 3:45 p.m.  Cardiology  follow-up with Dr. Stanford Breed will occur on Wednesday, March 16, 2003, at 10:15 a.m.   DISCHARGE LABORATORY DATA:  Sodium 137, potassium 4.5, chloride 104, CO2 21,  BUN 103, creatinine 4.9, glucose 142.  Phosphorus 6.1, calcium 9.2.                                                Henderson Cloud, M.D.    WA/MEDQ  D:  02/28/2003  T:  03/01/2003  Job:  [id number]   cc:   Louis Meckel, M.D.  [place]  [place] [address]  Fax: [phone]   Kirk Ruths, M.D.

## 2010-11-30 NOTE — Op Note (Signed)
[name], [name] NO.:  [id number]   MEDICAL RECORD NO.:  [id number]          PATIENT TYPE:  OIB   LOCATION:  2869                         FACILITY:  [facility]   PHYSICIAN:  Earnstine Regal, MD      DATE OF BIRTH:  1965-08-09   DATE OF PROCEDURE:  12/09/2004  DATE OF DISCHARGE:  12/06/2004                                 OPERATIVE REPORT   PREOPERATIVE DIAGNOSIS:  Incarcerated umbilical hernia.   POSTOPERATIVE DIAGNOSIS:  Incarcerated umbilical hernia.   PROCEDURE:  Repair of incarcerated umbilical hernia with Prolene mesh.   SURGEON:  Earnstine Regal, M.D.   ANESTHESIA:  General.   ESTIMATED BLOOD LOSS:  Minimal.   PREPARATION:  Betadine.   COMPLICATIONS:  None.   INDICATIONS:  The patient is 45 year old black male with incarcerated  umbilical hernia. The patient was initially evaluated in September of 2005.  He was seen by [facility] for general surgical clearance. The  patient has a known history of cardiomyopathy with decreased ejection  fraction. He also has end-stage renal disease on hemodialysis. He now comes  to surgery for repair of incarcerated umbilical hernia.   PROCEDURE IN DETAIL:  In OR #16 at [facility], the patient was  brought to the operating room, placed in supine position on the operating  room table. Following administration of general anesthesia, the patient is  prepped and draped in usual strict aseptic fashion. After ascertaining that  an adequate level of anesthesia had been obtained, a infraumbilical incision  was made with a #15 blade. Dissection was carried down through the  subcutaneous tissues to the fascia. The fascial plane was developed around  the umbilical hernia defect. Umbilical hernia sac was dissected away from  the posterior aspect of the umbilical skin. Sac was opened. It contains  incarcerated omentum. This was reduced back within the peritoneal cavity.  The fascial plane above the level of the  umbilical defect is developed.  There are three further fascial defects in the midline and just off the  midline above the level of the umbilicus. Each of these was opened. The  fascial bridges between the defects are divided. Incarcerated omentum was  reduced. Fascial edges were trimmed to healthy tissue circumferentially.  Fascial defect was then closed with interrupted 0-Ethilon sutures. A sheet  of Prolene mesh was cut to the appropriate dimensions. An onlay patch of  Prolene mesh was then placed over the closure. It is secured  circumferentially with interrupted 0-Ethilon sutures. Good hemostasis was  noted. Umbilicus is re-affixed to the suture line with an interrupted 3-0  Vicryl suture. Subcutaneous tissues were closed with interrupted 3-0 Vicryl  sutures. Skin was closed with a running 4-0 Vicryl  subcuticular suture. Local field block is placed with Marcaine. Wound is  washed and dried and Benzoin and Steri-Strips were applied. Sterile  dressings were applied. The patient was awakened from anesthesia and brought  to the recovery room in stable condition. The patient tolerated the  procedure well.      TMG/MEDQ  D:  12/06/2004  T:  12/07/2004  Job:  [id number]   cc:   Louis Meckel, M.D.  [place]  [place] [address]  Fax: [phone]

## 2010-11-30 NOTE — Op Note (Signed)
[name], [name]             ACCOUNT NO.:  [id number]   MEDICAL RECORD NO.:  [id number]          PATIENT TYPE:  OIB   LOCATION:  2899                         FACILITY:  [facility]   PHYSICIAN:  Dorothea Glassman, M.D.    DATE OF BIRTH:  1966-04-02   DATE OF PROCEDURE:  08/28/2004  DATE OF DISCHARGE:                                 OPERATIVE REPORT   SURGEON:  P Cameron Sprang, MD   ASSISTANT:  John Giovanni, PA.   ANESTHETIC:  Local with MAC.   PREOPERATIVE DIAGNOSIS:  End-stage renal failure.   POSTOPERATIVE DIAGNOSIS:  End-stage renal failure.   PROCEDURE:  Left upper arm arteriovenous graft.   OPERATIVE PROCEDURE:  The patient was brought to the operating room in  stable condition. Placed in supine position. Left arm prepped and draped in  a sterile fashion.   Skin and subcutaneous tissue were instilled with 1% Xylocaine with  epinephrine. Longitudinal skin incision was made over the brachial pulse,  proximal to the antecubital fossa. Dissection carried down through  subcutaneous tissue electrocautery. The left brachial artery was exposed.  There was an occluded left upper arm arteriovenous fistula. The artery was  dissected free proximal and distal to the anastomosis and encircled with  Vesseloops.   A second longitudinal skin incision was made through the left axilla.  Dissection was carried down through subcutaneous tissue. The left axillary  vein was freed and encircled with Vesseloop.   A curvilinear tunnel was made between the 2 incisions. A 6-mm Gore-Tex graft  was placed through the tunnel.   The brachial artery was controlled proximally and distally with serrefine  clamps. The arteriovenous anastomosis was taken down. The arteriotomy was  extended proximally and distally. The graft was then anastomosed end-to-side  to the brachial artery using running 6-0 Prolene suture. Clamps were then  removed. Excellent flow present through the graft. The graft controlled  with  fistula clamp.   In the axilla, the vein was ligated distally with 2-0 silk. Divided and  controlled proximally with a serrefine clamp. The graft divided and  anastomosed end-to-end to the vein using running 6-0 Prolene suture. Clamps  were removed. Excellent flow was present. Adequate hemostasis obtained.  Sponges and instrument counts correct.   Subcutaneous tissue was closed running 3-0 Vicryl suture. Skin closed 4-0  Monocryl. Steri-Strips applied. The patient tolerated the procedure well.  Transferred to the recovery room in stable condition.      PGH/MEDQ  D:  08/28/2004  T:  08/28/2004  Job:  [id number]

## 2010-11-30 NOTE — Op Note (Signed)
NAMEMARQUELL, [name] NO.:  [id number]   MEDICAL RECORD NO.:  [id number]          PATIENT TYPE:  OIB   LOCATION:  2899                         FACILITY:  [facility]   PHYSICIAN:  Jessy Oto. Fields, MD  DATE OF BIRTH:  [birth year]   DATE OF PROCEDURE:  11/05/2004  DATE OF DISCHARGE:  11/05/2004                                 OPERATIVE REPORT   PROCEDURE:  Thrombectomy and revision of left upper arm arteriovenous graft.   PREOPERATIVE DIAGNOSIS:  Thrombosed arteriovenous graft.   POSTOPERATIVE DIAGNOSIS:  Thrombosed arteriovenous graft.   ANESTHESIA:  Local with IV sedation.   SURGEON:  Jessy Oto. Fields, MD   ASSISTANT:  Nurse.   FINDINGS:  1.  Narrowing of venous anastomosis.  2.  Revision with 6-mm interposition graft.   OPERATIVE DETAILS:  After obtaining informed consent, the patient was taken  to the operating room.  The patient was placed supine position on the  operating table.  After adequate sedation, the patient's entire left upper  extremity was prepped and draped in the usual sterile fashion.  Local  anesthesia was infiltrated at a preexisting longitudinal incision up near  the axilla.  Incision was reopened sharply and carried down through the  subcutaneous tissues down to the level of the graft.  The graft was  dissected free circumferentially.  Dissection then proceeded up on the level  of the venous anastomosis.  The previous anastomosis had been end-to-end to  a large branch of the main axillary vein; the branch was approximately 5 mm  in diameter.  The anastomosis was approximately 4 cm below the confluence  into the main axillary vein.  Proximal and distal control vein was obtained  with Vesseloops.  Next, the patient was given 5000 units of intravenous  heparin.  A transverse graftotomy was made just above the level of the  venous anastomosis.  A #4  Fogarty catheter was then used to thrombectomized  the venous anastomosis.  The anastomosis  would accept a 3 and 4 dilator, but  a 5 dilator was snug.  Therefore, the venous anastomosis was taken down.  There was a flap of either pseudointima for a retained valve just at the  anastomosis which was thought to be the cause of the thrombosis.  The venous  anastomosis was taken down and this thickened portion of vein debrided.  Next, a #4 Fogarty catheter was used to thrombectomized the arterial limb of  the graft.  There was good arterial inflow.  A 6-mm interposition graft was  then brought up in the operative field and beveled.  The vein was spatulated  and a new venous anastomosis was created just above the confluent end of the  main axillary vein.  If the graft has to be revised in the future, it should  probably be revised to the main axillary vein.  After the anastomosis was  completed, it was fore-bled, back-bled and thoroughly flushed.  Graft was  then clamped just above the level of the venous anastomosis it was cut to  length and then anastomosed end-to-end to the old graft with a  running 6-0  Prolene suture.  Just prior completion of the anastomosis, this was fore-  bled , back-bled and thoroughly flushed.  The anastomosis was secured,  clamps were released and there was a palpable thrill within the graft  immediately.  After hemostasis was obtained with thrombin and Gelfoam, the  subcutaneous tissues were reapproximated using a running  3-0 Vicryl suture.  The skin was then closed with 4-0 Vicryl subcuticular  stitch.  The patient tolerated procedure well and there were no immediate  complications.  Instrument, sponge and needle count was correct at the end  of the case.  The patient taken to recovery room in stable condition.      CEF/MEDQ  D:  11/05/2004  T:  11/06/2004  Job:  [id number]

## 2010-11-30 NOTE — Op Note (Signed)
NAMESAVIEL, WAPLE             ACCOUNT NO.:  [id number]   MEDICAL RECORD NO.:  [id number]          PATIENT TYPE:  AMB   LOCATION:  SDS                          FACILITY:  [facility]   PHYSICIAN:  Nelda Severe. Kellie Simmering, M.D.  DATE OF BIRTH:  1966-03-08   DATE OF PROCEDURE:  03/13/2005  DATE OF DISCHARGE:                                 OPERATIVE REPORT   PREOPERATIVE DIAGNOSIS:  End-stage renal disease.   POSTOPERATIVE DIAGNOSIS:  End-stage renal disease.   OPERATION:  Creation of a right upper arm brachial artery to cephalic vein  arteriovenous fistula (Kauffman shunt).   SURGEON:  Nelda Severe. Kellie Simmering, M.D.   FIRST ASSISTANT:  Leta Baptist, PA.   ANESTHESIA:  Local.   PROCEDURE:  The patient was taken to the operating room and placed in the  supine position, at which time the right upper extremity was prepped with  Betadine scrub and solution and draped in the routine sterile manner.  After  infiltration with 1% Xylocaine, a transverse incision was made the  antecubital area.  The cephalic vein was dissected free.  It was an  excellent vein, being at least 4 mm in size.  It was dissected proximally  and distally and ligated distally and transected.  The brachial artery was  exposed beneath the fascia, encircled with vessel loops, where it was an  excellent vessel with a 3+ pulse.  Heparin 300 units was given  intravenously.  The artery was occluded proximally and distally with vessel  loops, opened with a 15 blade, extended with Potts scissors.  The vein was  then anastomosed end-to-side with 6-0 Prolene.  Vessel loops were then  released, and there was a good pulse and thrill in the vein as well as good  radial arterial Doppler flow at the wrist.  No protamine was given.  The  wound was irrigated with saline, closed in layers with Vicryl in  subcuticular fashion.  Sterile dressing applied.  The patient taken to the  recovery room in satisfactory condition.     ______________________________  Nelda Severe Kellie Simmering, M.D.     JDL/MEDQ  D:  03/13/2005  T:  03/13/2005  Job:  [id number]

## 2010-11-30 NOTE — Op Note (Signed)
   NAME:  [name], [name]                       ACCOUNT NO.:  [id number]   MEDICAL RECORD NO.:  [id number]                   PATIENT TYPE:  INP   LOCATION:  6710                                 FACILITY:  [facility]   PHYSICIAN:  Earnstine Regal, M.D.                DATE OF BIRTH:  1966-06-09   DATE OF PROCEDURE:  03/05/2003  DATE OF DISCHARGE:                                 OPERATIVE REPORT   PREOPERATIVE DIAGNOSIS:  Right groin abscess.   POSTOPERATIVE DIAGNOSIS:  Right groin abscess.   PROCEDURE:  Incision & drainage of right groin abscess with open packing.   SURGEON:  Earnstine Regal, M.D.   ANESTHESIA:  Local with IV sedation.   PREPARATION:  Betadine.   COMPLICATIONS:  None.   BLOOD LOSS:  Minimal.   INDICATIONS FOR PROCEDURE:  The patient is a 45 year old black male admitted  on the renal service with leukocytosis and right groin pain. Physical exam  shows what appears to be a large abscess of the right groin.  Duplex  ultrasound was performed to rule out pseudoaneurysm. No evidence of  pseudoaneurysm was identified.  Patient is now brought to the operating room  for incision & drainage.   DESCRIPTION OF PROCEDURE:  The procedure was done in OR #16 at the [facility].  The patient was brought to the operating room,  placed in the supine position on the operating table.  Following  administration of IV sedation, the patient was prepped and draped in the  usual strict, aseptic fashion.  After ascertaining that an adequate level of  sedation had been obtained, the skin at the site of the abscess in the right  groin was anesthetized with local anesthetic.  Using a #10 blade, a 3 cm  incision was made full-thickness down through the subcutaneous tissues into  the abscess cavity.  Approximately 30 cc of purulent fluid is evacuated.  Aerobic and anaerobic cultures are taken and submitted to the laboratory.   The wound is debrided bluntly.  Loculations were  broken down digitally. The  wound was copiously irrigated with warm saline.  Wound was packed with 1/2  inch Iodoform gauze packing. Dry, gauze dressings were placed.  The patient  was brought to the recovery room in stable condition.  The patient tolerated  the procedure well.                                                  Earnstine Regal, M.D.   TMG/MEDQ  D:  03/05/2003  T:  03/06/2003  Job:  [id number]

## 2010-11-30 NOTE — Op Note (Signed)
[name], [name] NO.:  [id number]   MEDICAL RECORD NO.:  [id number]          PATIENT TYPE:  OIB   LOCATION:  2899                         FACILITY:  [facility]   PHYSICIAN:  Judeth Cornfield. Scot Dock, M.D.DATE OF BIRTH:  05-Nov-1965   DATE OF PROCEDURE:  02/19/2005  DATE OF DISCHARGE:  02/19/2005                                 OPERATIVE REPORT   PREOPERATIVE DIAGNOSIS:  Chronic renal failure.   POSTOPERATIVE DIAGNOSIS:  Chronic renal failure.   PROCEDURE:  Ultrasound guided placement of right IJ Diatek catheter (28 cm).   SURGEON:  Judeth Cornfield. Scot Dock, M.D.   ASSISTANT:  Nurse.   ANESTHESIA:  Local with sedation.   DESCRIPTION OF PROCEDURE:  The patient was taken to the operating sedated by  anesthesia.  Delta sound scanner was used to mark both internal jugular  veins, both of which appeared to be patent. The neck and upper chest were  then prepped and draped in the usual sterile fashion.  With the patient in  Trendelenburg, the skin was anesthetized with 1% lidocaine, and the right IJ  was cannulated.  A guidewire was introduced into the superior vena cava  under fluoroscopic control.  The tract over the wire was dilated, and the  dilator and peel-away sheath were passed over the wire and wire and dilator  removed.  The catheter was passed through the peel-away sheath and  positioned in the right atrium. The exit site for the catheter was selected,  and the skin anesthetized between the two areas.  The catheter was then  brought through the tunnel, cut to the appropriate length, and the distal  ports were attached.  Both ports withdrew easily. We then flushed with  heparinized saline and filled with concentrated heparin.  The catheter was  secured at its exit site with a 2-0 nylon suture.  The IJ cannulation site  was closed with 4-0 subcuticular stitch. A sterile dressing was applied. The  patient tolerated the procedure well and was transferred to  recovery room in  satisfactory condition.  All needle and sponge counts were correct.       CSD/MEDQ  D:  02/19/2005  T:  02/20/2005  Job:  [id number]

## 2010-11-30 NOTE — Discharge Summary (Signed)
NAMECASON, SEAGREN             ACCOUNT NO.:  [id number]   MEDICAL RECORD NO.:  [id number]          PATIENT TYPE:  INP   LOCATION:  6708                         FACILITY:  [facility]   PHYSICIAN:  Earnstine Regal, MD      DATE OF BIRTH:  Aug 04, 1965   DATE OF ADMISSION:  11/22/2008  DATE OF DISCHARGE:  11/25/2008                               DISCHARGE SUMMARY   REASON FOR ADMISSION:  Secondary hyperparathyroidism.   HISTORY OF PRESENT ILLNESS:  The patient is a 45 year old male with end-  stage renal disease.  The patient has developed secondary  hyperparathyroidism.  He is now admitted on the Surgical Service for  parathyroidectomy.   HOSPITAL COURSE:  The patient was admitted on the Surgical Service and  taken directly to the operating room.  He underwent total  parathyroidectomy with autotransplantation to the forearm.  Postoperative course was relatively straightforward.  He was followed  closely by Nephrology.  He had a significant fall in his serum calcium  levels as well as his serum phosphate levels.  He received hemodialysis  while in inpatient.  He had stabilization of his levels and was prepared  for discharge home on Nov 25, 2008.   DISCHARGE PLAN:  The patient is discharged home on Nov 25, 2008 in good  condition, tolerating his renal diet, and ambulating independently.   DISCHARGE MEDICATIONS:  Vicodin as needed for pain.   FOLLOWUP:  The patient will be seen back in my office at [facility] in followup in 2-4 weeks.  He will continue to be  followed closely by Dr. Corliss Parish.   FINAL DIAGNOSIS:  Secondary hyperparathyroidism, end-stage renal  disease.   CONDITION AT DISCHARGE:  Good.      Earnstine Regal, MD  Electronically Signed     TMG/MEDQ  D:  12/28/2008  T:  12/29/2008  Job:  [id number]   cc:   Louis Meckel, M.D.

## 2010-11-30 NOTE — Op Note (Signed)
NAMEAOI, FREHNER NO.:  [id number]   MEDICAL RECORD NO.:  [id number]          PATIENT TYPE:  OIB   LOCATION:  2899                         FACILITY:  [facility]   PHYSICIAN:  Nelda Severe. Kellie Simmering, M.D.  DATE OF BIRTH:  [birth year]   DATE OF PROCEDURE:  02/18/2005  DATE OF DISCHARGE:  02/18/2005                                 OPERATIVE REPORT   PREOPERATIVE DIAGNOSIS:  Thrombosed left arteriovenous Gore-Tex graft, left  upper arm.   POSTOPERATIVE DIAGNOSIS:  Thrombosed left arteriovenous Gore-Tex graft, left  upper arm.   OPERATION:  Thrombectomy arteriovenous Gore-Tex graft left upper arm with  insertion of new segment of graft and existing graft to axillary vein with 6  mm Gore-Tex.   SURGEON:  Nelda Severe. Kellie Simmering, M.D.   FIRST ASSISTANT:  Leta Baptist, P.A.-C.   ANESTHESIA:  Local.   PROCEDURE:  The patient was taken to the operating room and placed in the  supine position at which time the left upper extremity was prepped with  Betadine scrubbing solution and draped in routine sterile manner.  After  infiltration with 1% Xylocaine, an incision was made in the axilla through  the previous scar in the left arm.  Gore-Tex to axillary vein anastomosis  dissected free.  3000 units heparin were given intravenously.  A transverse  opening was made in the graft and the graft, itself, was easily  thrombectomized.  There was minimal hyperplasia, partially obstructing the  venous anastomosis.  This was excised and a deep branch ligated with 2-0  silk tie and the vein mobilized more proximally.  A new piece of 6 mm graft  was anastomosed end-to-end to the old graft and end-to-end to the proximal  vein with 6-0 Prolene.  The clamp was then released, there was an excellent  pulse and thrill in the graft.  No Protamine was given.  The wound was  irrigated with saline and closed in layers with Vicryl in a subcuticular  fashion.  A sterile dressing was applied.  The patient  was taken to the  recovery room in satisfactory condition.       JDL/MEDQ  D:  02/18/2005  T:  02/18/2005  Job:  [id number]

## 2010-11-30 NOTE — Consult Note (Signed)
NAME:  [name], [name]                       ACCOUNT NO.:  [id number]   MEDICAL RECORD NO.:  [id number]                   PATIENT TYPE:  INP   LOCATION:  1828                                 FACILITY:  [facility]   PHYSICIAN:  James L. Deterding, M.D.            DATE OF BIRTH:  1966-05-12   DATE OF CONSULTATION:  02/15/2003  DATE OF DISCHARGE:                                   CONSULTATION   REASON FOR CONSULTATION:  Acute renal failure with pulmonary edema.   HISTORY OF PRESENT ILLNESS:  This is a 45 year old gentleman who apparently  is followed in the medical outpatient clinic here.  He was also seen by Dr.  Moshe Cipro  in the last few months for renal consult, apparently related  to diabetes and renal insufficiency.  He also has hypertension and diabetes  at least five to 10 years.  His wife says she does not really know.  He has  a 20-pack-year history of smoking.  At the current time he has a two to  three day history of worsening PND, orthopnea, having to sit up to  breathing, worsening cough.  It may have been going on as much as a week to  10 days.  He has had worsening pedal edema.  For the last 24 hours, he has  had substernal left-sided chest pain associated with nausea and vomiting.  He has not been watching his sugars. She thinks he has been taking his  medicine but she is not sure.   MEDICATIONS AT HOME:  Lasix, metoprolol, albuterol, Norvasc, and Nasacort.  We do not know the doses and she does not either.   He presented this evening with pulmonary edema requiring intubation with  hypoxemia, acidemia and no response to IV furosemide.   At this time, he is intubated and cannot get a review of systems.   PAST MEDICAL HISTORY:  This includes the above illnesses.  His wife denies  hospitalization.   ALLERGIES:  His wife denies any allergies.   MEDICATIONS:  As listed above.   SOCIAL HISTORY:  He lives with his wife.   FAMILY HISTORY:  Negative for renal  disease.   REVIEW OF SYSTEMS:  Not obtainable. What we got from his wife, she says he  has had no fever or chills.  He has had a cough.  He has had no skin rash,  arthritis or arthralgias.  Denies excessive use of nonsteroidals.  He has  had no falls or trauma.  He has had no blood in his urine or diarrhea that  she knows of.  He has not complained of other issues.  He has no history of  hepatitis or jaundice.   PHYSICAL EXAMINATION:  VITAL SIGNS:  Blood pressure 150/106, heart rate 105.  HEENT:  Diabetic retinopathy.  He has hypertensive changes with copper and  silver wiring.  He has microaneurysms, exudates and scarring.  NECK:  No thyromegaly.  LUNGS:  There are diffuse rhonchi or rales.  CARDIOVASCULAR:  Regular rhythm.  Grade 2/6 holosystolic murmur heard best  at the apex.  No S4 or S3.  Pulses are 2+/4+.  No bruits are noted.  He has  2+ edema.  ABDOMEN:  Liver is down 4 to 5 cm.  Positive bowel sounds.  Abdomen is soft.  SKIN:  There are a few bruises with acne on his face and some on his arms.  MUSCULOSKELETAL:  Unremarkable.  At this time he is sedated and does not  respond to pain.  NEUROLOGIC:  Deep tendon reflexes are 2+/4+ in the upper extremities, trace  in the Achilles, 1+ in the patella.  He has no significant adenopathy axilla  or supraclavicular.  He has posterior cervical adenopathy.   ASSESSMENT:  Congestive heart failure with cardiac enlargement.   LABORATORY DATA:  Amylase is 120 and lipase is 71.  His CK is 590, MB 9.4,  troponin 0.06.  Protime 12.7.  Sodium 132, potassium 4.4, chloride 104,  bicarbonate 19, creatinine 3.5, BUN 64, glucose 46.  Hemoglobin 13.1, white  count 12.6, platelets 359,000.  Urine with 300 mg percent protein, 100 mg  percent glucose, large hemoglobin and microscopic not done.   ASSESSMENT:  Chronic renal insufficiency, acute worsening.  At this time he  is oliguric, acidemic and severely volume overloaded.  Needs to increase  volume  clearance at this time to output.  The decrease in his volume may  help his cardiovascular status also.  May get some function back with  decrease in volume for getting him back on the Starling curve.  It is now  thought that this is an acute cardiac event that has triggered this.  Need  to rule out infection also.  He may have primary volume overload which may  have triggered a lot of this also.   __________  in the setting of acute myocardial infarction is probably the  best option at this time.  If he becomes stable, would use hemodialysis.   1. Acute myocardial infarction, per Dr. Stanford Breed.  Will try to stabilize him     and he will need catheterization.  He is being admitted for __________     function.  2. Congestive heart failure.  Biggest issue here related to #1.  3. Smoking.  This is obviously a big risk factor.  4. Diabetes mellitus.  Will need tight sugar control.   Overall, his renal function is very poor at baseline and he may evolve  eventually into end-stage renal disease.  We need to try to regress him at  this time from a cardiac and pulmonary standpoint.  His renal standpoint may  be that this is going to be an issue long-term if he survives.   PLAN:  1. __________ .  2. Control his blood pressure.  3. Continue Lasix to see if we can increase urine volume.  4. Rule out myocardial infarction.  5. Glucose control.                                               James L. Deterding, M.D.    JLD/MEDQ  D:  02/15/2003  T:  02/16/2003  Job:  [id number]

## 2010-11-30 NOTE — Discharge Summary (Signed)
NAME:  [name], [name]                       ACCOUNT NO.:  [id number]   MEDICAL RECORD NO.:  [id number]                   PATIENT TYPE:  INP   LOCATION:  6710                                 FACILITY:  [facility]   PHYSICIAN:  Sol Blazing, M.D.             DATE OF BIRTH:  21-Jan-1966   DATE OF ADMISSION:  03/05/2003  DATE OF DISCHARGE:  03/17/2003                                 DISCHARGE SUMMARY   ADMISSION DIAGNOSES:  1. Right groin abscess.  2. Hypertension.  3. Insulin-dependent diabetes mellitus.  4. Iron-deficiency anemia.  5. Chronic renal failure.   DISCHARGE DIAGNOSES:  1. Status post right groin and posterior right thigh irrigation and     debridement.  2. Hypertension.  3. Insulin-dependent diabetes mellitus.  4. Iron-deficiency anemia.  5. History of chronic renal failure with recent exposure to contrast media     on March 11, 2003.   CONSULTATIONS:  1. Earnstine Regal, M.D., on March 05, 2003.  He recommended irrigation and     debridement of right groin abscess with open packing.  2. Jessy Oto, M.D., on March 06, 2003, recommended ultrasound of right     posterior thigh indurated area, ultimately requiring irrigation and     debridement.   PROCEDURES:  1. See consults.  2. Two units packed rbc on March 09, 2003, were given to the patient due to     hemoglobin decrease to 6.7 from a hemoglobin of 7.9 upon admission.  He     responded well with a hemoglobin of 10 on the day of discharge.  3. On March 05, 2003, CT scan of right pelvis without contrast media showed     right common femoral artery 4 cm collection representing     hematoma/pseudoaneurysm versus abscess.  4. Ultrasound right thigh revealed right groin abscess suspected as     described as report, see chart for more details.  5. On March 08, 2003, a right thigh ultrasound was done showing complex     circumscribed mass, posterior aspect right mid and lower thigh measuring     2.1 x  4.8 x 1.7 cm.  6. On March 11, 2003, a CT of right thigh with contrast media revealed 4 x     2.6 x 2.8 cm low density region within distal belly of biceps femoral     muscle consistent with intramuscular abscess.  7. On March 15, 2003, chest x-ray revealed prominent cardiomegaly, no overt     edema.  This was ordered due to decreased breath sounds and worsening     renal function.   HISTORY OF PRESENT ILLNESS:  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  He returned to the [facility] on March 05, 2003, with a  right groin abscess.  He complained these signs and symptoms of the right  groin abscess began about one day after he was discharged on February 28, 2003.  He described increased pain, swelling and warmth.  During his February 15, 2003, through February 28, 2003, admission, [name] required a  hemodialysis treatment via femoral catheter as well as a right-sided cardiac  catheterization.  Upon discharge on February 28, 2003, his BUN was stable at  108 and creatinine at 4.9.  Upon presentation on March 05, 2003, he says he  feels fine without anorexia, nausea, vomiting.  He had, however, a bad taste  in his mouth as well as fever and chills in the last two days prior to being  admitted on March 05, 2003.   Physical examination in the emergency room showed temperature 100.9.  Right  groin massively swollen, indurated, exquisitely tender with two pustules,  increased warmth.  His white count upon admission was 19.5.  On the day of  discharge on February 28, 2003, his white count was 15,000.  He was admitted  for IV antibiotics and surgical consult and a right groin CT.   HOSPITAL COURSE:  PROBLEM #1 -  RIGHT GROIN ABSCESS:  Dr. Harlow Asa was   consulted the day of admission and upon reviewing CT scan of his right  groin, Dr. Harlow Asa felt that this was probably an abscess to his right groin.  He wanted to rule out pseudoaneurysm.  He recommended an ultrasound of his  right groin and his ultrasound showed more likelihood of an abscess.  Therefore, he took him to surgery the night of March 05, 2003, and  irrigated and debrided the abscess and left the surgical site open with  packing.  We continued IV antibiotics.  Blood cultures were obtained.  Right  groin fluid cultures were obtained.  He was empirically placed on Zosyn  awaiting culture and sensitivity results.  Blood cultures obtained on March 05, 2003, were ultimately negative.  The culture on the right groin abscess  obtained on March 05, 2003, was positive for Staph aureus.  Mr. Ganguly  received three days of IV Zosyn.  This was discontinued on March 08, 2003,  and Augmentin 875 mg p.o. b.i.d. was started.  He only received one day of  this and was changed to Keflex 500 mg p.o. t.i.d.  He received one day of  the t.i.d. dosing and was changed to Keflex 500 p.o. q.12h.  Mr. Nimtz was  ultimately sent home on Keflex 500 twice a day for a total of 18 days.   PROBLEM #2 -  The day after admission, Mr. Filar complained of a posterior  right thigh fullness.  This was ultimately evaluated by ultrasound and was  found to have a probable abscess in this area.  An MRI was ordered.  The  patient complained of feeling closed in.  A telephone order was received  from Dr. Jimmy Footman for Ativan and at the time of the incident and the time  of this dictation, I still do not understand why the MRI was canceled and a  CT scan with contrast media was ordered.  The CT scan showed abscess and Mr.  Moraski was again brought to the OR for irrigation and debridement.  Unfortunately, Mr. Provencal' BUN and creatinine increased slowly a few days after the CT scan was done. The CT scan of his  right thigh  was done on  March 11, 2003.  His BUN and creatinine on March 10, 2003, was 85 and 3.9,  respectively.  On March 13, 2003, his BUN and creatinine were 84 and 4.9.  On March 14, 2003, his BUN was 94 and creatinine 6.4.  We worried that he  may require another dialysis treatment.  Luckily he did not.  The day of  discharge, his BUN fell to 79 and his creatinine fell to 4.6, which was  about baseline for him.  Again, culture was obtained from this posterior  right thigh abscess and this was positive for Staph aureus as well.   PROBLEM #3 -  CHRONIC RENAL FAILURE:  BUN and creatinine remained stable  until March 13, 2003.  See hospital course #2 for more details.  Potassium  stayed within the 4s.  Albumin stayed in the mid 2s.  Phosphorus remained in  the low 6s to 7.  He understood that he was increasingly closer to chronic  hemodialysis upon discharge.  We discussed the need for an appointment with  CVTS for AV fistula or graft placement.  He was agreeable to this and an  appointment was therefore made.   PROBLEM #4 -  INSULIN-DEPENDENT DIABETES MELLITUS:  Mr. Lamkin had poor  control of his glucose.  His insulin 70/30 was ultimately increased to 30  units q.a.m. and 28 units q.p.m. from his admission doses of 20 units b.i.d.   PROBLEM #5 -  IRON-DEFICIENCY ANEMIA:  As you can see from the procedures,  Mr. Brabec required two units of packed rbc.  Again, he responded well.  On  the day of discharge his hemoglobin was 10.0.  He was discharged to continue  his Aranesp injections as well as his p.o. iron supplement.   PROBLEM #6 -  HYPERTENSION:  By mid to late hospital admission, blood  pressures were more controlled, although systolic blood pressure averaged  around 145.   DISCHARGE MEDICATIONS:  1. Os-Cal 500 mg four tablets with each meal.  2. Lopressor 50 mg p.o. b.i.d.  3. Imdur 60 mg p.o. daily.  4. Aspirin 81 mg p.o. daily.  5. Protonix 40 mg p.o. daily.  6.  Apresoline 100 mg p.o. b.i.d.  7. Ferrous sulfate 325 mg p.o. t.i.d.  8. Lasix 160 mg t.i.d.  9. Insulin 70/30 30 units subcu q.a.m. and 28 units q.p.m.  10.      Lanoxin 0.125 mg only Monday through Friday.  11.      Keflex 500 mg p.o. b.i.d. daily until gone.   CONDITION ON DISCHARGE:  He was discharged in improved condition.   DIET:  He was encouraged to follow his ADA diet as well as a low sodium, low  phosphorus diet.   DISCHARGE INSTRUCTIONS:  Changing dressings twice a day.  Home health RN  will continue to help him with that at home.  He understands to follow up  with Dr. Moshe Cipro at [facility] on April 05, 2003, at 3:15 as  well as go to [facility] on [address] on April 01, 2003, for hospital  follow-up lab work-up.  Also, he knows to follow up with Dr. Oneida Alar for AV  fistula placement on April 15, 2003, at 9:45 on [facility] and he will  call Dr. Otho Ket office for posterior thigh stitch removal.      Delray Alt, PA  Sol Blazing, M.D.    MJG/MEDQ  D:  04/05/2003  T:  04/06/2003  Job:  [id number]   cc:   Jessy Oto, M.D.  [place]  [place] [address]  Fax: [phone]   Louis Meckel, M.D.  [address]  [place]  [place] [address]  Fax: [phone]   Jessy Oto. Fields, MD  [address], [place] [address]   Earnstine Regal, M.D.  [address]  [place] [address]  Fax: [phone]

## 2010-11-30 NOTE — Op Note (Signed)
NAME:  [name], [name]                       ACCOUNT NO.:  [id number]   MEDICAL RECORD NO.:  [id number]                   PATIENT TYPE:  INP   LOCATION:  6710                                 FACILITY:  [facility]   PHYSICIAN:  Jessy Oto, M.D.                DATE OF BIRTH:  [birth year]   DATE OF PROCEDURE:  03/12/2003  DATE OF DISCHARGE:                                 OPERATIVE REPORT   PREOPERATIVE DIAGNOSIS:  Intramuscular abscess right distal lateral  hamstring.   POSTOPERATIVE DIAGNOSIS:  Distal intramuscular abscess involving the right  distal lateral hamstring within the biceps femoris muscle.   PROCEDURE:  Incision, drainage, and irrigation of right hamstring  intramuscular abscess.   SURGEON:  Jessy Oto, M.D.   ANESTHESIA:  GOT by Juliann Pulse, M.D.   ESTIMATED BLOOD LOSS:  100 mL.   DRAINS:  1 inch Penrose drain x2.   INDICATIONS FOR PROCEDURE:  The patient is a 45 year old male with insulin-  dependent diabetes mellitus, renal insufficiency, and history of  hypertension.  One week ago, this patient underwent incision and drainage of  a right anterior thigh groin abscess by Earnstine Regal, M.D.  This grew out  Staph aureus sensitive to most antibiotics with the exception of penicillin.  The patient had previous placement of femoral catheters for the purpose of  renal dialysis.  Several days following his initial surgery on August 21,  the patient began noticing swelling over the posterior aspect of the right  distal thigh. This was tender, warm, and swollen.  Underwent ultrasound  consistent with mass versus abscess.  He was claustrophobic and could not  undergo MRI study yesterday evening, so CT scan was done which demonstrated  an intramuscular abscess right distal biceps femoris muscle.  He is brought  to the operating room today to undergo incision, drainage, and debridement.   FINDINGS:  Approximately an ounce of purulent drainage from the right  mid  substance of the distal biceps femoris muscle or hamstring.   DESCRIPTION OF PROCEDURE:  After adequate sedation, with the patient in the  left lateral decubitus position with a beanbag holding him in place.  The  patient did not undergo intubation.  Standard prep with Duraprep solution  over the right posterior thigh after draping out with 1000 drapes.  Draped  in the usual manner. Iodine Vi-drape was used.  Area for incision over the  localized mass found to be present in the distal lateral hamstrings  approximately 2-1/2 inches in length.  The incision was made through skin  and subcutaneous layers after first infiltrating the area with Marcaine 0.5%  with 1:200,000 epinephrine. An entire field block was performed.  Following  the incision through skin down to the fascia layer overlying the hamstring  compartment, incision was then made into this compartment, and biceps muscle  identified.  Fusiform mass felt to be present. This was  then incised  directly and immediate entry into abscess cavity for almost an ounce of  purulent, yellow, creamy material exuded. This was sent for culture and  sensitivity.  Irrigation was then performed of the abscess cavity using  nearly 3 liters of normal saline solution and then 500 mL of double  antibiotic solution.  1 inch Penrose drain was then placed in the depth of  the  incision into the abscess cavity. The edges of the incision were then  approximated with 4-0 nylon sutures.  A dressing of 4x4's, ABD pad, affixed  to the skin with Curlex and then Ace wrap applied.  The patient then  returned to the recovery room in satisfactory condition.  All needle,  sponge, and instrument count correct.                                               Jessy Oto, M.D.    Liz Malady  D:  03/14/2003  T:  03/14/2003  Job:  [id number]

## 2010-11-30 NOTE — Consult Note (Signed)
NAME:  [name], [name]                       ACCOUNT NO.:  [id number]   MEDICAL RECORD NO.:  [id number]                   PATIENT TYPE:  INP   LOCATION:  6710                                 FACILITY:  [facility]   PHYSICIAN:  Earnstine Regal, M.D.                DATE OF BIRTH:  10-30-1965   DATE OF CONSULTATION:  03/05/2003  DATE OF DISCHARGE:                                   CONSULTATION   REFERRING PHYSICIAN:  Dr. Roney Jaffe.   REASON FOR CONSULTATION:  Right groin abscess, rule out pseudoaneurysm.   HISTORY OF PRESENT ILLNESS:  The patient is a 45 year old black male  recently admitted on the renal service for pulmonary edema in need for  initiation of dialysis.  The patient had a dialysis catheter placed in the  right groin.  He had one run of hemodialysis through this catheter.  The  patient also underwent cardiac catheterization by his history during this  admission with access to the right femoral artery performed in the  catheterization lab.  The patient noted pain and swelling in the right groin  prior to discharge.  After discharge this persisted, became progressively  more tender and the patient developed fever and presented to the emergency  department for assessment.  The patient was seen and evaluated by Dr. Roney Jaffe.  He was found to have significant leukocytosis to 19,500.  Temperature was elevated at 100.9.  On exam he was felt to have a probable  abscess in the right groin.  General surgery was consulted.   PAST MEDICAL HISTORY:  1. New onset chronic renal failure secondary to hypertension and diabetes.  2. 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  3. Hypertension, insulin dependent diabetes.   MEDICATIONS:  Please see medical record.   ALLERGIES:  None known.   SOCIAL HISTORY:  The patient is accompanied by a friend.  He does not smoke.  He does not drink  alcohol.  He denies drug use.   FAMILY HISTORY:  Noncontributory.   REVIEW OF SYSTEMS:  Further system review is negative except as noted above.   PHYSICAL EXAMINATION:  GENERAL: 45 year old well-developed, well-nourished  black male on a stretcher in the emergency department.  VITAL SIGNS: Temperature 100.9, pulse 98, respirations 18, blood pressure  138/72.  HEENT: Shows him to be normocephalic.  Sclerae are clear, conjunctivae are  clear.  Pupils are equal, and reactive.  Dentition is good.  Voice is raspy.  NECK: Symmetric.  Thyroid is normal without nodularity, there is no anterior  or posterior cervical adenopathy.  There is no tenderness.  CHEST: Clear to auscultation.  There are no rales or rhonchi.  CARDIAC: Exam shows a regular rate and rhythm.  Peripheral pulses are 2+ at  the radius and dorsalis pedis bilaterally.  ABDOMEN: Soft, nontender without distention.  GENITOURINARY:  Exam shows an obvious area of induration and edema in the  right groin.  There are at least two puncture wounds which appear to be  reepithelialized.  There is no drainage.  There is marked surrounding  induration extending for about 10 cm around the groin.  There is a hard,  firm mass overlying the femoral vessels.  It is difficult to ascertain  whether there is a pulse within the mass or rather this is simply  transmitted from the underlying femoral artery.  There is induration  extending into the right medial thigh.  EXTREMITIES: Are warm x4, neurologically the patient's exam is grossly  intact.   DATA REVIEWED:  White count 19.5 thousand, hemoglobin 7.9, platelets  565,000.  Potassium 4.5, creatinine 4.8.   RADIOGRAPHIC STUDIES:  Include a CT scan which showed no other intra-  abdominal pathology.  There is an approximately  4 cm collection in the  right groin.  The radiologist cannot tell whether this represents  Pseudoaneurysm versus abscess.   IMPRESSION:  Probable right groin abscess, rule  out pseudoaneurysm from the  right femoral vessels, given recent cannulation.   PLAN:  The patient is to be admitted on the renal medicine service.  He is  initiated on intravenous antibiotics.  We will request an immediate duplex  ultrasound of the right groin in an attempt to rule out pseudoaneurysm.  If  there is a pseudoaneurysm present we will consult vascular surgery for  management.  If this is an abscess then he will go to the operating room  this evening for incision and drainage of right groin abscess.   I have discussed this at length with the patient.  He understands and agrees  to proceed with this plan.                                               Earnstine Regal, M.D.    TMG/MEDQ  D:  03/05/2003  T:  03/07/2003  Job:  [id number]

## 2010-11-30 NOTE — Op Note (Signed)
NAME:  [name], [name]                       ACCOUNT NO.:  [id number]   MEDICAL RECORD NO.:  [id number]                   PATIENT TYPE:  OIB   LOCATION:  2899                                 FACILITY:  [facility]   PHYSICIAN:  Dorothea Glassman, M.D.                 DATE OF BIRTH:  Jan 15, 1966   DATE OF PROCEDURE:  12/22/2003  DATE OF DISCHARGE:  12/22/2003                                 OPERATIVE REPORT   PREOPERATIVE DIAGNOSIS:  Chronic renal insufficiency.   POSTOPERATIVE DIAGNOSIS:  Chronic renal insufficiency.   OPERATION PERFORMED:  Creation of left brachiocephalic Terie Purser)  arteriovenous fistula.   SURGEON:  Dorothea Glassman, M.D.   ASSISTANT:  Hoyle Sauer A. Zigmund Gottron.   ANESTHESIA:  Local with MAC.   DESCRIPTION OF PROCEDURE:  The patient was brought to the operating room in  stable condition.  Placed in supine position.  Left arm prepped and draped  in the sterile fashion.  Skin and subcutaneous tissue instilled with 1%  Xylocaine with epinephrine.  A transverse skin incision was made through the  left antecubital fossa.  Dissection carried down through the subcutaneous  tissue.  The cephalic vein was identified in the antecubital fossa.  The  vein was small, 2 to 3 mm in size.  The vein mobilized and ligated distally  with 3-0 silk and divided.  Tributaries ligated with 4-0 silk and divided.  Brachial artery was then exposed, mobilized proximally and distally and  encircled with vessel loops.  The patient was administered 3000 units  heparin intravenously.  Adequate circulation time permitted.  Brachial  artery controlled proximally and distally with serrefine clamps.  Longitudinal arteriotomy made.  The vein was divided and anastomosed end-to-  side to the brachial artery with running 7-0 Prolene sutures.  Clamps were  then removed.  The vein was noted to be small caliber but patent.  Easily  audible signal was heard well up into the midhumerus level.  Adequate  hemostasis  obtained.  Sponge and needle counts were correct.  Subcutaneous  tissue closed with running 3-0 Vicryl suture.  Skin closed with 4-0  Monocryl.  Steri-Strips applied.  Sterile dressing applied.  The patient  tolerated the procedure well.  Transferred to the recovery room in stable  condition.                                               Dorothea Glassman, M.D.    PGH/MEDQ  D:  12/22/2003  T:  12/22/2003  Job:  [id number]

## 2010-11-30 NOTE — Consult Note (Signed)
NAME:  [name], [name]                       ACCOUNT NO.:  [id number]   MEDICAL RECORD NO.:  [id number]                   PATIENT TYPE:  INP   LOCATION:  6710                                 FACILITY:  [facility]   PHYSICIAN:  Jessy Oto, M.D.                DATE OF BIRTH:  Jan 16, 1966   DATE OF CONSULTATION:  DATE OF DISCHARGE:                                   CONSULTATION   REQUESTING PHYSICIAN:  Earnstine Regal, M.D.   ORTHOPEDIC DIAGNOSES:  1. Right posterior  distal  thigh mass in the lateral  hamstring consistent     with abscess versus tumor, rule out hematoma.  2. Status post  incision and drainage of the anterior thigh abscess, growing     Staphylococcus aureus, currently on antibiotics.  3. Insulin dependent diabetes mellitus.  4. Hypertension.  5. Renal insufficiency.   HISTORY OF PRESENT ILLNESS:  The patient is a 45 year old male with insulin  dependent diabetes mellitus, hypertension and renal insufficiency who  developed right anterior thigh pain and swelling, warmth, tenderness  and  fevers. The patient had laboratory  tests done demonstrating leukocytosis  and swelling in the right groin area consistent with an abscess. A general  surgery consultation, Dr. Armandina Gemma took the patient to the operating room  on March 05, 2003, and performed an incision and drainage and debridement  of a right anterior groin abscess. Cultures grew out Staphylococcus aureus  sensitive to all antibiotics with the exception of penicillin.   The patient has noted after 2 to 3 days postoperatively swelling of the  right posterolateral thigh distally. This is quite  tender and was swollen  earlier this week. It now has improved. He is presently awaiting discharge,  however, was claustrophobic with  the MRI scan today and was unable to be  scanned. He is interested in being discharged today. He has no particular  leg pain, numbness or weakness noted. He has not been on  anticoagulation  during his hospital course with the exception of Imdur and aspirin.   His clinical examination today shows the right lower extremity  neurovascularly normal. He has full extension of the knee and full flexion.  No effusion in the right knee. The right ankle is normal without effusion  and is nontender. Sensory is intact. Motor is normal in the right lower  extremity. Pulses are normal. The area of right anterior groin incision and  drainage is undergoing dressing changes. There is no abnormality relative to  this. He does have a mass over  the right posterior distal  thigh in the  region of the lateral  hamstrings. This appears to be nonfixed, hard and  relatively nontender, mass approximately  4 inches in length by about 1-1/2  inches in diameter. No significant warmth noted, although it does appear to  be deep.   Cultures of the patient's anterior thigh abscess have returned  Staphylococcus aureus. An ultrasound of the patient's right posterior thigh  has returned indicating a complex mass over  the right posterior thigh  within the mid and lower thigh, measuring about  2.1 x 4.8 x 1.7 cm. The finding is most consistent with an infected hematoma  versus early abscess.   Today the patient underwent a CT scan of the right distal  thigh that shows  an irregular mass in the right distal  hamstrings laterally. The findings  were most  consistent with abscess.   IMPRESSION:  Right posterior distal thigh intramuscular absent, most likely  associated with Staphylococcus septicemia, possibly secondary to anterior  thigh Staphylococcus abscess.   PLAN:  The patient will be taken to the operating room in the morning to  undergo an incision and drainage of the right posterior thigh abscess.                                                Jessy Oto, M.D.    Liz Malady  D:  03/11/2003  T:  03/13/2003  Job:  [id number]

## 2010-11-30 NOTE — Op Note (Signed)
[name], [name]             ACCOUNT NO.:  [id number]   MEDICAL RECORD NO.:  [id number]          PATIENT TYPE:  OIB   LOCATION:  2856                         FACILITY:  [facility]   PHYSICIAN:  Judeth Cornfield. Scot Dock, M.D.DATE OF BIRTH:  1966/01/29   DATE OF PROCEDURE:  10/04/2004  DATE OF DISCHARGE:  10/04/2004                                 OPERATIVE REPORT   PREOPERATIVE DIAGNOSIS:  Chronic renal failure with clotted left upper arm  arteriovenous graft.   POSTOPERATIVE DIAGNOSIS:  Chronic renal failure with clotted left upper arm  arteriovenous graft.   PROCEDURE:  1.  Thrombectomy of left upper arm arteriovenous graft.  2.  Insertion of new segment of graft in more proximal axillary vein.  3.  Intraoperative arteriogram.  4.  Exploration of arterial anastomosis and retrieval of retained pseudo-      intima.   SURGEON:  Judeth Cornfield. Scot Dock, M.D.   ASSISTANT:  Jacinta Shoe, P.A.   ANESTHESIA:  Local with sedation.   TECHNIQUE:  The patient was taken to the operating room and sedated by  anesthesia.  The left upper extremity was prepped and draped in usual  sterile fashion.  After the skin was infiltrated with 1% lidocaine, the  incision over the venous anastomosis was opened and the venous limb of the  graft dissected free.  The patient was then heparinized.  The graft was  divided and graft thrombectomy achieved using a#4 Fogarty catheter.  The  arterial plug was retrieved and excellent inflow established.  There were no  problems pulling the catheter through the body the graft; however, there was  a lot of pseudointima, which I retrieved.  There was marked intimal  hyperplasia at the venous anastomosis and I elected to jump higher on the  vein.  The more proximal vein was spatulated.  A new segment 6-mm PTFE was  spatulated and sewn end-to-end to the vein using continuous 6-0 Prolene  suture.  The graft was then pulled to the appropriate length for  anastomosis  to the old graft, which was done with continuous 6-0 Prolene suture.  I then  shot an intraoperative arteriogram which showed what looked like possibly  some retained pseudointima, I therefore elected to explore the arterial end.  The previous incision was opened after the skin was anesthetized and the  arterial limb of the graft dissected free.  It was clipped proximally and  distally,  and a transverse graftotomy was made.  I did retrieve a large  amount of pseudointima from this end and explored the arterial anastomosis,  which was widely patent.  This graftotomy was closed with running 6-0  Prolene suture.  At the completion, there was an excellent thrill in the  graft.  Hemostasis was obtained in the wounds and the wounds were  closed with deep layer of 3-0 Vicryl and the skin closed with 4-0 Vicryl.  A  sterile dressing was applied.  The patient tolerated procedure well and was  transferred to recovery room in satisfactory condition.  All needle and  sponge counts were correct.      CSD/MEDQ  D:  10/04/2004  T:  10/05/2004  Job:  [id number]

## 2010-11-30 NOTE — Consult Note (Signed)
NAME:  [name], [name]                       ACCOUNT NO.:  [id number]   MEDICAL RECORD NO.:  [id number]                   PATIENT TYPE:  INP   LOCATION:  2103                                 FACILITY:  [facility]   PHYSICIAN:  Kirk Ruths, M.D.                DATE OF BIRTH:  12-04-1965   DATE OF CONSULTATION:  02/15/2003  DATE OF DISCHARGE:                                   CONSULTATION   REPORT TITLE:  CARDIOLOGY CONSULTATION   REFERRING PHYSICIAN:  Burnett Harry. Joya Gaskins, M.D. [facility]   HISTORY OF PRESENT ILLNESS:  Mr. Mcnemar is a 45 year old male with a past  medical history of diabetes mellitus, hypertension, renal insufficiency who  we were asked to evaluate for chest pain and pulmonary edema.  Of note, the  history is obtained completely from his wife as he is intubated and sedated  at the time of the examination.  The patient, apparently, has been evaluated  by Dr. Moshe Cipro recently for worsening renal insufficiency.  In the past  two weeks, the patient's wife states that he has had progressive dyspnea on  exertion, orthopnea, PND, as well as pedal edema.  He has also complained of  chest pain that has been intermittent.   His symptoms increased tonight and he presented to the emergency room with  acute pulmonary edema and subsequently was intubated.  Cardiologist now  asked to further evaluate.  Of note, his blood pressure at the time of  arrival was 150/116.   MEDICATIONS:  His medications at home are unknown at this time.   ALLERGIES:  He is not allergic to medications per his wife.   PAST MEDICAL HISTORY:  Significant for diabetes mellitus and hypertension.  He also has recently diagnosed renal insufficiency.   SOCIAL HISTORY:  He does smoke, but apparently does not consume alcohol per  his wife.  He also does not use drugs by her report.   FAMILY HISTORY:  Positive for coronary artery disease.   REVIEW OF SYSTEMS:  Not obtainable, as the patient is intubated and  sedated  at the time of this dictation.  However, his wife states he is not had  fevers or chills.  He has had a cough productive of clear sputum.  There has  been no hemoptysis.  She states that he has not had melena or hematochezia.  He apparently has been urinating.  Remaining systems are not obtainable at  this time.   PHYSICAL EXAMINATION:  VITAL SIGNS:  Blood pressure at the time of arrival  150/116, pulse 108.  GENERAL:  At present, he is sedated and intubated.  HEENT:  Unremarkable at present, although his pupils are dilated following  sedation.  He is unresponsive.  SKIN:  Warm and dry.  There is no peripheral clotting.  NECK:  Supple.  Jugular venous distension is not assessable.  There is no  thyromegaly noted.  He has normal upstroke  bilaterally.  I cannot appreciate  bruits.  CHEST:  Shows diffuse rales throughout.  CARDIOVASCULAR:  Tachycardiac rate and there is a gallop noted.  I cannot  appreciate murmurs  ABDOMEN:  Shows no tenderness and no distension.  There are no masses  appreciated, no bruits.  There is no hepatosplenomegaly.  EXTREMITIES:  He has 2+ femoral pulses bilaterally with no bruits.  His  extremities show no cords.  There is 1-2+ ankle edema bilaterally.  He has  2+ posterior tibial pulses bilaterally.  NEUROLOGICAL:  Not obtainable at this time due to the patient being sedated  and intubated.   LABORATORY DATA:  Hemoglobin 13.1, hematocrit 38.4, white blood cell count  12.6, and platelet count 359.  Potassium 4.4, BUN and creatinine 64/3.7.  Initial CK 590, MB 9.4, Troponin-I 0.06.  BNP elevated 2730.   Electrocardiogram shows sinus tachycardia with biatrial enlargement.  There  is a possibility of prior septal infarction.  There were nonspecific ST  changes.   Chest x-ray shows cardiac enlargement and pulmonary edema.   DIAGNOSES:  1. Acute pulmonary edema  2. Renal insufficiency  3. Diabetes mellitus  4. Hypertensive crisis   PLAN:  Mr.  Boody is admitted with acute pulmonary edema and volume  overload requiring intubation, as well as a two week history of chest pain.  His symptoms could certainly be ischemic mediated.  Other possible  contributions to his presentation included volume overload secondary to  renal insufficiency and also hypertension leading to pulmonary edema.   I agree with admission to the CCU and we will cycle enzymes to exclude the  myocardial infarction.  We will plan to treat with aspirin, intravenous  heparin, intravenous nitroglycerin and Lasix.  We will also check an  echocardiogram in the morning to assess his LV function.  Critical Care has  been managing his ventilator at this point.  Nephrology has also been  consulted and the patient is to be started on CVVHD this evening.   The patient will ultimately, most likely, require cardiac catheterization  given his presentation, probable myocardial infarction prior ECG, history of  chest pain prior to admission and multiple risk factors.  He will be at high  risk for contrast nephropathy; however, this was discussed with the family  and the patient appears to be approaching dialysis regardless.  We will need  to discuss this further with Nephrology over the next several days prior to  proceeding with catheterization.  There is no indication for acute  catheterization at this point.                                               Kirk Ruths, M.D.    BC/MEDQ  D:  02/15/2003  T:  02/16/2003  Job:  [id number]

## 2010-12-04 ENCOUNTER — Ambulatory Visit: Payer: Medicare Other | Admitting: Vascular Surgery

## 2011-01-01 ENCOUNTER — Ambulatory Visit (INDEPENDENT_AMBULATORY_CARE_PROVIDER_SITE_OTHER): Payer: Medicare Other | Admitting: Vascular Surgery

## 2011-01-01 DIAGNOSIS — N186 End stage renal disease: Secondary | ICD-10-CM

## 2011-01-02 NOTE — Assessment & Plan Note (Signed)
OFFICE VISIT  Ricky, Jefferson DOB:  01/19/1966                                       01/01/2011 [id number]  The patient presents today for followup of his AV fistula creation, left upper arm basilic vein transposition by myself on October 11, 2010.  He has healed his incisions all quite nicely with excellent maturation of his basilic vein transposition fistula.  He is nearly 3 months out.  He has continued to have good function with his dialysis catheter.  I feel that he should be accessible to use his fistula in the first week of July.  He understands the critical importance of having experienced access personnel use this only.  He is questioning regarding rotating needles versus buttonhole and I have asked him to discuss this more fully with the access staff.  We will see him again on an as-needed basis.    Rosetta Posner, M.D. Electronically Signed  TFE/MEDQ  D:  01/01/2011  T:  01/02/2011  Job:  [id number]  cc:   Louis Meckel, M.D. [place]

## 2011-02-12 ENCOUNTER — Ambulatory Visit (HOSPITAL_COMMUNITY)
Admission: RE | Admit: 2011-02-12 | Discharge: 2011-02-12 | Disposition: A | Discharge status: Home / Self Care | Payer: Medicare Other | Source: Ambulatory Visit | Attending: Vascular Surgery | Admitting: Vascular Surgery

## 2011-02-12 DIAGNOSIS — Z538 Procedure and treatment not carried out for other reasons: Secondary | ICD-10-CM | POA: Insufficient documentation

## 2011-02-12 DIAGNOSIS — N186 End stage renal disease: Secondary | ICD-10-CM | POA: Insufficient documentation

## 2011-02-12 DIAGNOSIS — T82898A Other specified complication of vascular prosthetic devices, implants and grafts, initial encounter: Secondary | ICD-10-CM | POA: Insufficient documentation

## 2011-02-12 DIAGNOSIS — Z01812 Encounter for preprocedural laboratory examination: Secondary | ICD-10-CM | POA: Insufficient documentation

## 2011-02-12 DIAGNOSIS — Y832 Surgical operation with anastomosis, bypass or graft as the cause of abnormal reaction of the patient, or of later complication, without mention of misadventure at the time of the procedure: Secondary | ICD-10-CM | POA: Insufficient documentation

## 2011-02-12 LAB — GLUCOSE, CAPILLARY
Glucose-Capillary: 164 mg/dL — ABNORMAL HIGH (ref 70–99)
Glucose-Capillary: 142 mg/dL — ABNORMAL HIGH (ref 70–99)

## 2011-02-12 LAB — POCT I-STAT 4, (NA,K, GLUC, HGB,HCT): Potassium: 4.9 mEq/L (ref 3.5–5.1)

## 2011-02-19 ENCOUNTER — Ambulatory Visit (HOSPITAL_COMMUNITY)
Admission: RE | Admit: 2011-02-19 | Discharge: 2011-02-19 | Disposition: A | Discharge status: Home / Self Care | Payer: Medicare Other | Source: Ambulatory Visit | Attending: Vascular Surgery | Admitting: Vascular Surgery

## 2011-02-19 DIAGNOSIS — N186 End stage renal disease: Secondary | ICD-10-CM | POA: Insufficient documentation

## 2011-02-19 DIAGNOSIS — I120 Hypertensive chronic kidney disease with stage 5 chronic kidney disease or end stage renal disease: Secondary | ICD-10-CM

## 2011-02-19 DIAGNOSIS — T82898A Other specified complication of vascular prosthetic devices, implants and grafts, initial encounter: Secondary | ICD-10-CM | POA: Insufficient documentation

## 2011-02-19 DIAGNOSIS — Z992 Dependence on renal dialysis: Secondary | ICD-10-CM | POA: Insufficient documentation

## 2011-02-19 DIAGNOSIS — E119 Type 2 diabetes mellitus without complications: Secondary | ICD-10-CM | POA: Insufficient documentation

## 2011-02-19 DIAGNOSIS — Y849 Medical procedure, unspecified as the cause of abnormal reaction of the patient, or of later complication, without mention of misadventure at the time of the procedure: Secondary | ICD-10-CM | POA: Insufficient documentation

## 2011-02-19 LAB — GLUCOSE, CAPILLARY: Glucose-Capillary: 237 mg/dL — ABNORMAL HIGH (ref 70–99)

## 2011-02-19 LAB — POCT I-STAT 4, (NA,K, GLUC, HGB,HCT)
Glucose, Bld: 254 mg/dL — ABNORMAL HIGH (ref 70–99)
Hemoglobin: 13.3 g/dL (ref 13.0–17.0)
Potassium: 4.4 mEq/L (ref 3.5–5.1)
Sodium: 137 mEq/L (ref 135–145)

## 2011-03-14 NOTE — Op Note (Signed)
[name], [name] NO.:  [id number]  MEDICAL RECORD NO.:  [id number]  LOCATION:  SDSC                         FACILITY:  [facility]  PHYSICIAN:  Jessy Oto. Fields, MD  DATE OF BIRTH:  1966-06-16  DATE OF PROCEDURE:  02/19/2011 DATE OF DISCHARGE:  02/19/2011                              OPERATIVE REPORT   PROCEDURE:  Attempted thrombectomy, left basilic vein transposition fistula.  PREOPERATIVE DIAGNOSIS:  End-stage renal disease with thrombosed left arm arteriovenous fistula.  POSTOPERATIVE DIAGNOSIS:  End-stage renal disease with thrombosed left arm arteriovenous fistula.  ANESTHESIA:  Local with IV sedation.  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  OPERATIVE DETAILS:  After obtaining informed consent, the patient was taken to the operating room.  The patient was placed in supine position on the operating room table.  After adequate sedation, the patient's entire left upper extremity was prepped and draped in the usual sterile fashion.  Local anesthesia was infiltrated at a preexisting longitudinal scar near the antecubital crease.  Incision was carried down through subcutaneous tissues down to the level of an existing basilic vein transposition fistula.  The vein was diffusely narrowed approaching the arterial anastomosis.  Dissection was carried all the way down to the level of the arterial anastomosis and the brachial artery was dissected free proximal and distal to the fistula anastomosis.  There were dense adhesions and scar tissue in this area and dissection around the artery was fairly tedious.  Vessel loops were placed proximal and distal to the anastomosis.  Dissection was then carried several centimeters up to the vein to where the vein looked of a more  reasonable caliber.  The patient was given 5000 units of intravenous heparin.  Vessel loops were used to control the artery proximally and distally.  Longitudinal opening was made in the vein just above the anastomosis.  There was intimal hyperplastic thickening and near obliteration of the entire limb of the vein.  The venotomy was extended toward the upper arm until a lumen was encountered in the vein.  I then attempted to pass #3 and #4 Fogarty catheters through the fistula.  I was able to get the catheter to pass approximately 10 cm up but this would not pass into central circulation and there was a large amount of thrombus and the vein was of poor quality and very diseased.  At this point, attempts to thrombectomize the fistula were abandoned.  The distal basilic vein was ligated with 2- 0 silk tie.  The proximal segment of the vein was oversewn as a small cuff on the artery using a running 6-0 Prolene suture.  Vessel loops were removed.  There was good palpable pulse in brachial artery above and below level of repair.  Hemostasis was obtained.  Subcutaneous tissues were reapproximated using running 3-0 Vicryl suture.  Skin was closed with 4-0 Vicryl subcuticular stitch.  The patient tolerated the procedure well, and there were no complications.  Instrument, sponge, and needle counts were correct at the end of the case.  The patient was taken  to the recovery room in stable condition.  A plan will be made to place a right arm access in the near future.  The patient will be scheduled for a right central venogram prior to placing a right arm access.  We will contact the patient regarding that scheduling.     Jessy Oto. Fields, MD     CEF/MEDQ  D:  02/19/2011  T:  02/19/2011  Job:  [id number]  Electronically Signed by Ruta Hinds MD on 03/14/2011 [id number] PM

## 2011-03-15 ENCOUNTER — Emergency Department (HOSPITAL_COMMUNITY)
Admission: EM | Admit: 2011-03-15 | Discharge: 2011-03-16 | Disposition: A | Discharge status: Home / Self Care | Payer: Medicare Other | Attending: Emergency Medicine | Admitting: Emergency Medicine

## 2011-03-15 DIAGNOSIS — X58XXXA Exposure to other specified factors, initial encounter: Secondary | ICD-10-CM | POA: Insufficient documentation

## 2011-03-15 DIAGNOSIS — Z794 Long term (current) use of insulin: Secondary | ICD-10-CM | POA: Insufficient documentation

## 2011-03-15 DIAGNOSIS — M25519 Pain in unspecified shoulder: Secondary | ICD-10-CM | POA: Insufficient documentation

## 2011-03-15 DIAGNOSIS — IMO0002 Reserved for concepts with insufficient information to code with codable children: Secondary | ICD-10-CM | POA: Insufficient documentation

## 2011-03-15 DIAGNOSIS — I498 Other specified cardiac arrhythmias: Secondary | ICD-10-CM | POA: Insufficient documentation

## 2011-03-15 DIAGNOSIS — E119 Type 2 diabetes mellitus without complications: Secondary | ICD-10-CM | POA: Insufficient documentation

## 2011-03-15 LAB — DIFFERENTIAL
Lymphocytes Relative: 12 % (ref 12–46)
Monocytes Absolute: 1.5 10*3/uL — ABNORMAL HIGH (ref 0.1–1.0)
Monocytes Relative: 11 % (ref 3–12)
Neutro Abs: 10.8 10*3/uL — ABNORMAL HIGH (ref 1.7–7.7)

## 2011-03-15 LAB — POCT I-STAT, CHEM 8
BUN: 38 mg/dL — ABNORMAL HIGH (ref 6–23)
Calcium, Ion: 0.89 mmol/L — ABNORMAL LOW (ref 1.12–1.32)
Chloride: 99 mEq/L (ref 96–112)
Glucose, Bld: 176 mg/dL — ABNORMAL HIGH (ref 70–99)
TCO2: 28 mmol/L (ref 0–100)

## 2011-03-16 ENCOUNTER — Emergency Department (HOSPITAL_COMMUNITY): Payer: Medicare Other

## 2011-03-16 LAB — CBC
HCT: 37.8 % — ABNORMAL LOW (ref 39.0–52.0)
Hemoglobin: 12.3 g/dL — ABNORMAL LOW (ref 13.0–17.0)
MCH: 29.9 pg (ref 26.0–34.0)
MCHC: 32.5 g/dL (ref 30.0–36.0)
MCV: 92 fL (ref 78.0–100.0)

## 2011-03-19 ENCOUNTER — Encounter: Payer: Self-pay | Admitting: Vascular Surgery

## 2011-04-02 ENCOUNTER — Ambulatory Visit (HOSPITAL_COMMUNITY)
Admission: RE | Admit: 2011-04-02 | Discharge: 2011-04-02 | Disposition: A | Discharge status: Home / Self Care | Payer: Medicare Other | Source: Ambulatory Visit | Attending: Surgery | Admitting: Surgery

## 2011-04-02 DIAGNOSIS — I120 Hypertensive chronic kidney disease with stage 5 chronic kidney disease or end stage renal disease: Secondary | ICD-10-CM | POA: Insufficient documentation

## 2011-04-02 DIAGNOSIS — E119 Type 2 diabetes mellitus without complications: Secondary | ICD-10-CM | POA: Insufficient documentation

## 2011-04-02 DIAGNOSIS — N186 End stage renal disease: Secondary | ICD-10-CM | POA: Insufficient documentation

## 2011-04-02 DIAGNOSIS — T82898A Other specified complication of vascular prosthetic devices, implants and grafts, initial encounter: Secondary | ICD-10-CM | POA: Insufficient documentation

## 2011-04-02 DIAGNOSIS — I871 Compression of vein: Secondary | ICD-10-CM | POA: Insufficient documentation

## 2011-04-02 DIAGNOSIS — Y832 Surgical operation with anastomosis, bypass or graft as the cause of abnormal reaction of the patient, or of later complication, without mention of misadventure at the time of the procedure: Secondary | ICD-10-CM | POA: Insufficient documentation

## 2011-04-02 LAB — POCT I-STAT, CHEM 8
BUN: 52 mg/dL — ABNORMAL HIGH (ref 6–23)
Chloride: 103 mEq/L (ref 96–112)
Creatinine, Ser: 12.2 mg/dL — ABNORMAL HIGH (ref 0.50–1.35)
Glucose, Bld: 126 mg/dL — ABNORMAL HIGH (ref 70–99)
HCT: 36 % — ABNORMAL LOW (ref 39.0–52.0)
Potassium: 4.7 mEq/L (ref 3.5–5.1)

## 2011-04-04 LAB — ANAEROBIC CULTURE

## 2011-04-04 LAB — RENAL FUNCTION PANEL
BUN: 43 — ABNORMAL HIGH
CO2: 30
Chloride: 89 — ABNORMAL LOW
Creatinine, Ser: 10.58 — ABNORMAL HIGH
Glucose, Bld: 102 — ABNORMAL HIGH
Potassium: 4.5

## 2011-04-04 LAB — CBC
HCT: 29.2 — ABNORMAL LOW
Hemoglobin: 12.1 — ABNORMAL LOW
Hemoglobin: 9.3 — ABNORMAL LOW
MCHC: 30.7
MCV: 87
MCV: 88.2
Platelets: 314
RBC: 3.35 — ABNORMAL LOW
RDW: 21.3 — ABNORMAL HIGH
WBC: 10.9 — ABNORMAL HIGH

## 2011-04-04 LAB — BASIC METABOLIC PANEL
BUN: 31 — ABNORMAL HIGH
Chloride: 90 — ABNORMAL LOW
Potassium: 4.4

## 2011-04-04 LAB — TISSUE CULTURE: Culture: NO GROWTH

## 2011-04-04 LAB — HEMOGLOBIN A1C: Mean Plasma Glucose: 175

## 2011-04-04 LAB — GRAM STAIN

## 2011-04-13 NOTE — Op Note (Signed)
  NAMEFREDERICO, VENEMAN             ACCOUNT NO.:  [id number]  MEDICAL RECORD NO.:  [id number]  LOCATION:  SDSC                         FACILITY:  [facility]  PHYSICIAN:  Theotis Burrow IV, MDDATE OF BIRTH:  15-Dec-1965  DATE OF PROCEDURE:  04/02/2011 DATE OF DISCHARGE:                              OPERATIVE REPORT   PREOPERATIVE DIAGNOSIS:  Renal disease.  POSTOPERATIVE DIAGNOSIS:  Renal disease.  PROCEDURE PERFORMED:  Right arm and central venogram.  SURGEON: 1. Annamarie Major IV, MD  PROCEDURE TECHNIQUE:  The patient had a antecubital IV placed in the Short-Stay.  He was brought to the room 8.  A time-out was called. Contrast injection was performed through the IV.  FINDINGS:  The deep venous system and the upper arm is patent without significant stenosis.  There is a stent visualized in the cephalic vein which is occluded.  There is a stenosis within the subclavian vein.  A catheter is visualized in the right atrium.  IMPRESSION: 1. Patent brachial venous system. 2. Right subclavian vein stenosis.     Eldridge Abrahams, MD     VWB/MEDQ  D:  04/02/2011  T:  04/02/2011  Job:  [id number]  Electronically Signed by Orvan Falconer IV MD on 04/13/2011 09:57:54 AM

## 2011-04-16 LAB — POTASSIUM: Potassium: 4.6

## 2011-04-16 LAB — GLUCOSE, CAPILLARY

## 2011-04-25 ENCOUNTER — Ambulatory Visit: Payer: Medicare Other | Admitting: Vascular Surgery

## 2011-04-25 ENCOUNTER — Other Ambulatory Visit: Payer: Medicare Other

## 2011-04-25 LAB — CROSSMATCH: ABO/RH(D): A POS

## 2011-04-25 LAB — ABO/RH: ABO/RH(D): A POS

## 2011-04-29 LAB — CULTURE, BLOOD (ROUTINE X 2)
Culture: NO GROWTH
Culture: NO GROWTH

## 2011-04-29 LAB — CBC
HCT: 26.3 — ABNORMAL LOW
Hemoglobin: 8.3 — ABNORMAL LOW
MCHC: 31.6
MCHC: 31.7
MCV: 84.8
Platelets: 291
RBC: 3.1 — ABNORMAL LOW
RBC: 3.75 — ABNORMAL LOW
RDW: 20.3 — ABNORMAL HIGH
WBC: 13.2 — ABNORMAL HIGH
WBC: 10.3

## 2011-04-29 LAB — HEMOGLOBIN A1C
Hgb A1c MFr Bld: 7.3 — ABNORMAL HIGH
Mean Plasma Glucose: 183

## 2011-04-29 LAB — CROSSMATCH
ABO/RH(D): A POS
Antibody Screen: NEGATIVE
Antibody Screen: NEGATIVE

## 2011-04-29 LAB — RENAL FUNCTION PANEL
BUN: 32 — ABNORMAL HIGH
Chloride: 96
Creatinine, Ser: 9.91 — ABNORMAL HIGH
Glucose, Bld: 79
Potassium: 3.7

## 2011-04-29 LAB — BASIC METABOLIC PANEL
CO2: 30
Calcium: 9.7
Creatinine, Ser: 7.08 — ABNORMAL HIGH
GFR calc Af Amer: 10 — ABNORMAL LOW
GFR calc non Af Amer: 9 — ABNORMAL LOW

## 2011-04-29 LAB — ABO/RH: ABO/RH(D): A POS

## 2011-05-10 ENCOUNTER — Ambulatory Visit (HOSPITAL_COMMUNITY)
Admission: RE | Admit: 2011-05-10 | Discharge: 2011-05-10 | Disposition: A | Discharge status: Home / Self Care | Payer: Medicare Other | Source: Ambulatory Visit | Attending: Vascular Surgery | Admitting: Vascular Surgery

## 2011-05-10 DIAGNOSIS — T82898A Other specified complication of vascular prosthetic devices, implants and grafts, initial encounter: Secondary | ICD-10-CM

## 2011-05-10 DIAGNOSIS — N186 End stage renal disease: Secondary | ICD-10-CM | POA: Insufficient documentation

## 2011-05-10 DIAGNOSIS — Y849 Medical procedure, unspecified as the cause of abnormal reaction of the patient, or of later complication, without mention of misadventure at the time of the procedure: Secondary | ICD-10-CM | POA: Insufficient documentation

## 2011-05-10 LAB — POCT I-STAT, CHEM 8
BUN: 52 mg/dL — ABNORMAL HIGH (ref 6–23)
Calcium, Ion: 0.9 mmol/L — ABNORMAL LOW (ref 1.12–1.32)
Chloride: 102 mEq/L (ref 96–112)
Glucose, Bld: 217 mg/dL — ABNORMAL HIGH (ref 70–99)
HCT: 38 % — ABNORMAL LOW (ref 39.0–52.0)

## 2011-05-14 NOTE — Op Note (Signed)
NAMERAMONTE, FECTEAU NO.:  [id number]  MEDICAL RECORD NO.:  [id number]  LOCATION:  SDSC                         FACILITY:  [facility]  PHYSICIAN:  Jessy Oto. Pernell Dikes, MD  DATE OF BIRTH:  [birth year]  DATE OF PROCEDURE:  05/10/2011 DATE OF DISCHARGE:                              OPERATIVE REPORT   PROCEDURE: 1. Left central venogram. 2. Right central venogram. 3. Angioplasty of right subclavian vein.  PREOPERATIVE DIAGNOSES: 1. End-stage renal disease. 2. Right central vein stenosis.  POSTOPERATIVE DIAGNOSES: 1. End-stage renal disease. 2. Right central vein stenosis  ANESTHESIA:  Local.  OPERATIVE DETAILS:  After obtaining informed consent, the patient was taken to the [place] lab.  The patient was placed in supine position on the angio table.  The patient's mid right upper arm was prepped and draped in usual sterile fashion.  Ultrasound was brought up in the operative field, and the deep brachial vein was localized using ultrasound.  The brachial artery was identified and had normal pulse within it.  The adjacent deep brachial vein was of reasonable caliber and easily compressible.  Local anesthesia was infiltrated over the area of the deep brachial vein.  A micropuncture needle was then used to successfully cannulate the vein, and the micropuncture wire threaded through the needle into the right upper arm venous system.  The micropuncture sheath was then placed over the guidewire after making a small nick in the skin.  The micropuncture sheath was advanced over the guidewire into the right axillary vein.  The guidewire and dilator were removed and a contrast injection was performed through the micropuncture sheath.  This shows a patent axillary venous system.  There was initially a poor opacification of the right central veins.  Therefore, the micropuncture sheath was exchanged over an 0.035 Versacore wire for a 5-French short sheath, and over the  Versacore  wire, a 5-French straight catheter was advanced into the distal right subclavian vein. Contrast angiogram was then performed, which showed a 70% stenosis of the right subclavian vein extending from where the subclavian crossed under the clavicle all the way down to the level of the superior vena cava.  Of note, there was also a right internal jugular vein Diatek catheter, which after it comes into the innominate vein causes some luminal compromise.  At this point, it was decided to intervene on this stenosis since the right upper extremity would be a possible access site for him.  The patient was given 3000 units of intravenous heparin.  The 5-French sheath was then accessed with the 0.035 Versacore wire, and the wire was advanced down into the right atrium under fluoroscopic guidance.  An 8 x 4 angioplasty balloon was then brought up in the operative field and advanced to the level of the stenosis.  This was then inflated to 10 atmospheres for 1 minute, which was a nominal pressure.  There was some wasting on the central aspect portion of the balloon, however, it was obvious that the balloon was undersized compared to the native subclavian vein.  Therefore, the 8-mm PowerFlex balloon was removed over the guidewire, and a 12 x 4 mm FoxCross balloon was brought up in the operative field  and this was advanced to the level of the lesion and then inflated to 4 atmospheres, which was also a nominal pressure.  A completion angiogram was then obtained, which shows complete recoiling of the stenosis.  The patient had experienced some mild chest discomfort on inflation of the balloon, so I felt that any repeat inflation was probably going to continue the recoil but I did not feel that there was any advantage to placing a stent in this position as far as durability was concerned.  At this point, the guidewire was removed and a final completion angiogram was obtained showed that the subclavian  and innominate veins were patent.  There was some retrograde flow on opacification into the right internal jugular vein, and there was good flow still into the superior vena cava with no evidence of extravasation or dissection.  The 5-French straight catheter was then removed over a guidewire and attention was turned to the patient's left arm.  Since the patient's right venous outflow system was probably not going to be the best candidate for any upper arm graft, and the patient already had an IV in place in the left upper extremity, I decided the best course of action was to go ahead and perform a left central venogram at the time the patient was on the table.  The patient had not received any sedation and was informed of the procedure prior to doing a central venogram on the left side and that the only difference in this side would be administration of contrast through the left side rather than the right through a preexisting IV that had been placed in short stay earlier in the morning.  Contrast injection was performed through the left peripheral IV and this shows a patent axillary vein at the level of the humeral head.  The subclavian vein is also patent.  There does not appear to be any innominate or subclavian venous stenosis on the left side.  At this point, the procedure was concluded.  An ACT was performed, which showed that the patient's ACT was approximately 150.  The patient had been given 3000 units of heparin prior to the angioplasty procedure. Since the ACT was in a reasonable range, we decided to pull the sheaths from the right deep brachial vein.  This was pulled and hemostasis obtained with direct pressure.  There were no complications.  The patient tolerated the procedure well and was taken to the holding area in stable condition.  OPERATIVE FINDINGS: 1. 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 2. Patent left central venous system with left axillary vein.  It was discussed with the patient that the next access would be placement of a left upper arm AV graft, and we will schedule this for him next week.  It would be possible to place a right upper extremity access at some point in the future, but he may develop some venous hypertension due to the narrowing of his subclavian vein.  However, I would not perform further intervention or stenting of this unless he has symptoms related after access is placed as he has had no upper extremity arm symptoms through this point.     Jessy Oto. Keria Widrig, MD     CEF/MEDQ  D:  05/10/2011  T:  05/10/2011  Job:  [id number]  Electronically Signed by Ruta Hinds MD on 05/14/2011 01:01:23 PM

## 2011-06-11 ENCOUNTER — Other Ambulatory Visit: Payer: Self-pay | Admitting: *Deleted

## 2011-06-24 ENCOUNTER — Encounter (HOSPITAL_COMMUNITY): Payer: Self-pay

## 2011-07-01 ENCOUNTER — Encounter (HOSPITAL_COMMUNITY): Payer: Self-pay

## 2011-07-01 MED ORDER — SODIUM CHLORIDE 0.9 % IV SOLN: INTRAVENOUS | Status: DC

## 2011-07-01 MED ORDER — DEXTROSE 5 % IV SOLN
1.5000 g | INTRAVENOUS | Status: AC
  Administered 2011-07-02: 1.5 g via INTRAVENOUS
  Filled 2011-07-01: qty 1.5

## 2011-07-02 ENCOUNTER — Encounter (HOSPITAL_COMMUNITY): Payer: Self-pay | Admitting: Anesthesiology

## 2011-07-02 ENCOUNTER — Ambulatory Visit (HOSPITAL_COMMUNITY)
Admission: RE | Admit: 2011-07-02 | Discharge: 2011-07-02 | Disposition: A | Discharge status: Home / Self Care | Payer: Medicare Other | Source: Ambulatory Visit | Attending: Vascular Surgery | Admitting: Vascular Surgery

## 2011-07-02 ENCOUNTER — Encounter (HOSPITAL_COMMUNITY): Payer: Self-pay | Admitting: *Deleted

## 2011-07-02 ENCOUNTER — Encounter (HOSPITAL_COMMUNITY)
Admission: RE | Disposition: A | Discharge status: Home / Self Care | Payer: Self-pay | Source: Ambulatory Visit | Attending: Vascular Surgery

## 2011-07-02 ENCOUNTER — Ambulatory Visit (HOSPITAL_COMMUNITY): Payer: Medicare Other | Admitting: Anesthesiology

## 2011-07-02 DIAGNOSIS — K449 Diaphragmatic hernia without obstruction or gangrene: Secondary | ICD-10-CM | POA: Insufficient documentation

## 2011-07-02 DIAGNOSIS — Z794 Long term (current) use of insulin: Secondary | ICD-10-CM | POA: Insufficient documentation

## 2011-07-02 DIAGNOSIS — I120 Hypertensive chronic kidney disease with stage 5 chronic kidney disease or end stage renal disease: Secondary | ICD-10-CM | POA: Insufficient documentation

## 2011-07-02 DIAGNOSIS — Z87891 Personal history of nicotine dependence: Secondary | ICD-10-CM | POA: Insufficient documentation

## 2011-07-02 DIAGNOSIS — N186 End stage renal disease: Secondary | ICD-10-CM | POA: Insufficient documentation

## 2011-07-02 DIAGNOSIS — K219 Gastro-esophageal reflux disease without esophagitis: Secondary | ICD-10-CM | POA: Insufficient documentation

## 2011-07-02 DIAGNOSIS — E119 Type 2 diabetes mellitus without complications: Secondary | ICD-10-CM | POA: Insufficient documentation

## 2011-07-02 DIAGNOSIS — I509 Heart failure, unspecified: Secondary | ICD-10-CM | POA: Insufficient documentation

## 2011-07-02 HISTORY — PX: AV FISTULA PLACEMENT: SHX1204

## 2011-07-02 HISTORY — DX: Reserved for inherently not codable concepts without codable children: IMO0001

## 2011-07-02 HISTORY — DX: Encounter for other specified aftercare: Z51.89

## 2011-07-02 HISTORY — DX: Bronchitis, not specified as acute or chronic: J40

## 2011-07-02 LAB — POCT I-STAT 4, (NA,K, GLUC, HGB,HCT)
Glucose, Bld: 239 mg/dL — ABNORMAL HIGH (ref 70–99)
HCT: 31 % — ABNORMAL LOW (ref 39.0–52.0)
Potassium: 4 mEq/L (ref 3.5–5.1)
Sodium: 137 mEq/L (ref 135–145)

## 2011-07-02 LAB — GLUCOSE, CAPILLARY
Glucose-Capillary: 242 mg/dL — ABNORMAL HIGH (ref 70–99)
Glucose-Capillary: 155 mg/dL — ABNORMAL HIGH (ref 70–99)

## 2011-07-02 LAB — SURGICAL PCR SCREEN
MRSA, PCR: NEGATIVE
Staphylococcus aureus: NEGATIVE

## 2011-07-02 SURGERY — INSERTION OF ARTERIOVENOUS (AV) GORE-TEX GRAFT ARM
Anesthesia: Monitor Anesthesia Care | Site: Arm Upper | Laterality: Left | Wound class: Clean

## 2011-07-02 MED ORDER — PHENYLEPHRINE HCL 10 MG/ML IJ SOLN
INTRAMUSCULAR | Status: DC | PRN
  Administered 2011-07-02: 80 ug via INTRAVENOUS
  Administered 2011-07-02: 40 ug via INTRAVENOUS

## 2011-07-02 MED ORDER — HYDROCODONE-ACETAMINOPHEN 5-325 MG PO TABS
1.0000 | ORAL_TABLET | Freq: Four times a day (QID) | ORAL | Status: AC | PRN
  Administered 2011-07-02: 1 via ORAL

## 2011-07-02 MED ORDER — MIDAZOLAM HCL 5 MG/5ML IJ SOLN
INTRAMUSCULAR | Status: DC | PRN
  Administered 2011-07-02: 2 mg via INTRAVENOUS

## 2011-07-02 MED ORDER — HYDROCODONE-ACETAMINOPHEN 5-500 MG PO TABS: 1.0000 | ORAL_TABLET | Freq: Four times a day (QID) | ORAL | Status: AC | PRN

## 2011-07-02 MED ORDER — HYDROMORPHONE HCL PF 1 MG/ML IJ SOLN
0.2500 mg | INTRAMUSCULAR | Status: DC | PRN
  Administered 2011-07-02: 0.5 mg via INTRAVENOUS

## 2011-07-02 MED ORDER — SODIUM CHLORIDE 0.9 % IR SOLN
Status: DC | PRN
  Administered 2011-07-02: 1000 mL

## 2011-07-02 MED ORDER — PHENYLEPHRINE HCL 10 MG/ML IJ SOLN
10.0000 mg | INTRAVENOUS | Status: DC | PRN
  Administered 2011-07-02: 50 ug/min via INTRAVENOUS

## 2011-07-02 MED ORDER — DROPERIDOL 2.5 MG/ML IJ SOLN: 0.6250 mg | INTRAMUSCULAR | Status: DC | PRN

## 2011-07-02 MED ORDER — HEPARIN SODIUM (PORCINE) 1000 UNIT/ML IJ SOLN
INTRAMUSCULAR | Status: DC | PRN
  Administered 2011-07-02: 7000 [IU] via INTRAVENOUS

## 2011-07-02 MED ORDER — SODIUM CHLORIDE 0.9 % IV SOLN
INTRAVENOUS | Status: DC
  Administered 2011-07-02 (×2): via INTRAVENOUS

## 2011-07-02 MED ORDER — FENTANYL CITRATE 0.05 MG/ML IJ SOLN
INTRAMUSCULAR | Status: DC | PRN
  Administered 2011-07-02: 150 ug via INTRAVENOUS
  Administered 2011-07-02 (×4): 25 ug via INTRAVENOUS

## 2011-07-02 MED ORDER — SODIUM CHLORIDE 0.9 % IR SOLN
Status: DC | PRN
  Administered 2011-07-02: 10:00:00

## 2011-07-02 MED ORDER — PROTAMINE SULFATE 10 MG/ML IV SOLN
INTRAVENOUS | Status: DC | PRN
  Administered 2011-07-02: 70 mg via INTRAVENOUS

## 2011-07-02 MED ORDER — PROPOFOL 10 MG/ML IV EMUL
INTRAVENOUS | Status: DC | PRN
  Administered 2011-07-02: 200 mL via INTRAVENOUS

## 2011-07-02 MED ORDER — ONDANSETRON HCL 4 MG/2ML IJ SOLN
INTRAMUSCULAR | Status: DC | PRN
  Administered 2011-07-02: 4 mg via INTRAVENOUS

## 2011-07-02 SURGICAL SUPPLY — 40 items
ADH SKN CLS APL DERMABOND .7 (GAUZE/BANDAGES/DRESSINGS) ×1
CANISTER SUCTION 2500CC (MISCELLANEOUS) ×2 IMPLANT
CLIP TI MEDIUM 6 (CLIP) ×2 IMPLANT
CLIP TI WIDE RED SMALL 6 (CLIP) ×2 IMPLANT
CLOTH BEACON ORANGE TIMEOUT ST (SAFETY) ×2 IMPLANT
COVER SURGICAL LIGHT HANDLE (MISCELLANEOUS) ×4 IMPLANT
DECANTER SPIKE VIAL GLASS SM (MISCELLANEOUS) ×2 IMPLANT
DERMABOND ADVANCED (GAUZE/BANDAGES/DRESSINGS) ×1
DERMABOND ADVANCED .7 DNX12 (GAUZE/BANDAGES/DRESSINGS) ×1 IMPLANT
ELECT REM PT RETURN 9FT ADLT (ELECTROSURGICAL) ×2
ELECTRODE REM PT RTRN 9FT ADLT (ELECTROSURGICAL) ×1 IMPLANT
GAUZE SPONGE 2X2 8PLY STRL LF (GAUZE/BANDAGES/DRESSINGS) ×1 IMPLANT
GEL ULTRASOUND 20GR AQUASONIC (MISCELLANEOUS) IMPLANT
GLOVE BIO SURGEON STRL SZ 6.5 (GLOVE) ×1 IMPLANT
GLOVE BIO SURGEON STRL SZ7.5 (GLOVE) ×2 IMPLANT
GLOVE BIOGEL PI IND STRL 6.5 (GLOVE) IMPLANT
GLOVE BIOGEL PI IND STRL 7.0 (GLOVE) IMPLANT
GLOVE BIOGEL PI INDICATOR 6.5 (GLOVE) ×1
GLOVE BIOGEL PI INDICATOR 7.0 (GLOVE) ×4
GOWN PREVENTION PLUS XLARGE (GOWN DISPOSABLE) ×2 IMPLANT
GOWN STRL NON-REIN LRG LVL3 (GOWN DISPOSABLE) ×3 IMPLANT
GOWN STRL REIN XL XLG (GOWN DISPOSABLE) ×2 IMPLANT
GRAFT GORETEX STRT 6X50 (Vascular Products) ×1 IMPLANT
KIT BASIN OR (CUSTOM PROCEDURE TRAY) ×2 IMPLANT
KIT ROOM TURNOVER OR (KITS) ×2 IMPLANT
LOOP VESSEL MAXI BLUE (MISCELLANEOUS) ×1 IMPLANT
LOOP VESSEL MINI RED (MISCELLANEOUS) IMPLANT
NS IRRIG 1000ML POUR BTL (IV SOLUTION) ×2 IMPLANT
PACK CV ACCESS (CUSTOM PROCEDURE TRAY) ×2 IMPLANT
PAD ARMBOARD 7.5X6 YLW CONV (MISCELLANEOUS) ×4 IMPLANT
SPONGE GAUZE 2X2 STER 10/PKG (GAUZE/BANDAGES/DRESSINGS) ×1
SPONGE SURGIFOAM ABS GEL 100 (HEMOSTASIS) IMPLANT
SUT PROLENE 6 0 CC (SUTURE) ×5 IMPLANT
SUT VIC AB 3-0 SH 27 (SUTURE) ×4
SUT VIC AB 3-0 SH 27X BRD (SUTURE) ×2 IMPLANT
SUT VICRYL 4-0 PS2 18IN ABS (SUTURE) ×4 IMPLANT
TOWEL OR 17X24 6PK STRL BLUE (TOWEL DISPOSABLE) ×2 IMPLANT
TOWEL OR 17X26 10 PK STRL BLUE (TOWEL DISPOSABLE) ×2 IMPLANT
UNDERPAD 30X30 INCONTINENT (UNDERPADS AND DIAPERS) ×2 IMPLANT
WATER STERILE IRR 1000ML POUR (IV SOLUTION) ×2 IMPLANT

## 2011-07-02 NOTE — Preoperative (Signed)
Beta Blockers   Reason not to administer Beta Blockers:Not Applicable 

## 2011-07-02 NOTE — Anesthesia Postprocedure Evaluation (Signed)
  Anesthesia Post-op Note  Patient: [name]  Procedure(s) Performed:  INSERTION OF ARTERIOVENOUS (AV) GORE-TEX GRAFT ARM  Patient Location: PACU  Anesthesia Type: MAC  Level of Consciousness: awake, alert  and oriented  Airway and Oxygen Therapy: Patient Spontanous Breathing  Post-op Pain: mild  Post-op Assessment: Post-op Vital signs reviewed, Patient's Cardiovascular Status Stable, Respiratory Function Stable, Patent Airway, No signs of Nausea or vomiting and Pain level controlled  Post-op Vital Signs: Reviewed and stable  Complications: No apparent anesthesia complications

## 2011-07-02 NOTE — Progress Notes (Signed)
cbg is 183  At 1530.

## 2011-07-02 NOTE — Transfer of Care (Signed)
Immediate Anesthesia Transfer of Care Note  Patient: [name]  Procedure(s) Performed:  INSERTION OF ARTERIOVENOUS (AV) GORE-TEX GRAFT ARM  Patient Location: PACU  Anesthesia Type: General  Level of Consciousness: awake, alert , oriented and patient cooperative  Airway & Oxygen Therapy: Patient Spontanous Breathing and Patient connected to nasal cannula oxygen  Post-op Assessment: Report given to PACU RN, Post -op Vital signs reviewed and stable and Patient moving all extremities  Post vital signs: Reviewed and stable  Complications: No apparent anesthesia complications

## 2011-07-02 NOTE — Anesthesia Preprocedure Evaluation (Addendum)
Anesthesia Evaluation  Patient identified by MRN, date of birth, ID band Patient awake    Reviewed: Allergy & Precautions, H&P , NPO status , Patient's Chart, lab work & pertinent test results, reviewed documented beta blocker date and time   Airway Mallampati: III TM Distance: >3 FB Neck ROM: Full    Dental   Pulmonary former smoker clear to auscultation  Pulmonary exam normal       Cardiovascular hypertension, Pt. on medications +CHF Regular Normal- Systolic murmurs    Neuro/Psych    GI/Hepatic Neg liver ROS, hiatal hernia, GERD-  Medicated and Controlled,  Endo/Other  Diabetes mellitus-, Well Controlled, Insulin Dependent  Renal/GU CRFRenal disease     Musculoskeletal   Abdominal   Peds  Hematology   Anesthesia Other Findings   Reproductive/Obstetrics                         Anesthesia Physical Anesthesia Plan  ASA: III  Anesthesia Plan: General and MAC   Post-op Pain Management:    Induction: Intravenous  Airway Management Planned: LMA  Additional Equipment:   Intra-op Plan:   Post-operative Plan:   Informed Consent: I have reviewed the patients History and Physical, chart, labs and discussed the procedure including the risks, benefits and alternatives for the proposed anesthesia with the patient or authorized representative who has indicated his/her understanding and acceptance.     Plan Discussed with: CRNA, Surgeon and Anesthesiologist  Anesthesia Plan Comments:         Anesthesia Quick Evaluation

## 2011-07-02 NOTE — Progress Notes (Signed)
Report received from [facility], RN, and care of pt assumed.

## 2011-07-02 NOTE — Progress Notes (Signed)
Called to PACU to d/c iv fluids to hemo cath; blue port flushed w. 10cc ns, followed by 2.4cc heparin (1000 units/ml); both ports capped, clamped and taped for safety; for dialysis Wednesday

## 2011-07-02 NOTE — Progress Notes (Signed)
Iv TEAM personnel present and disconnects diatek from IVF.  Diatek capped and clamped by IV team RN.

## 2011-07-02 NOTE — Progress Notes (Signed)
Left arm good bruit and thrill.

## 2011-07-02 NOTE — H&P (Signed)
  [facility] SHORT STAY H&P  CC:  Needs access  HPI: Pt with ESRD and failed right access secondary to central vein stenosis.  Past Medical History  Diagnosis Date  . Diabetes mellitus   . Chronic kidney disease   . Hypertension   . Chest pain   . Morbid obesity   . CHF (congestive heart failure)     hx  . Bronchitis   . Blood transfusion   . H/O hiatal hernia     FH:  Non-Contributory  Social HX History  Substance Use Topics  . Smoking status: Former Smoker -- 0.5 packs/day    Types: Cigarettes  . Smokeless tobacco: Not on file  . Alcohol Use: No    Allergies Allergies  Allergen Reactions  . Percocet (Oxycodone-Acetaminophen) Nausea Only    Medications Current Facility-Administered Medications  Medication Dose Route Frequency Provider Last Rate Last Dose  . 0.9 %  sodium chloride infusion   Intravenous Continuous Elam Dutch, MD 20 mL/hr at 07/02/11 [phone]    . cefUROXime (ZINACEF) 1.5 g in dextrose 5 % 50 mL IVPB  1.5 g Intravenous On Call to [place], MD      . DISCONTD: 0.9 %  sodium chloride infusion   Intravenous Continuous Elam Dutch, MD         PHYSICAL EXAM  Filed Vitals:   07/02/11 0758  BP: 112/76  Pulse: 101  Temp: 98.2 F (36.8 C)  Resp: 20    General:  WDWN in NAD HENT: WNL Eyes: Pupils equal Pulmonary: normal non-labored breathing , without Rales, rhonchi,  wheezing Cardiac: RRR, Vascular Exam/Pulses:  2+ left brachial and radial pulse Extremities without ischemic changes, no Gangrene , no cellulitis; no open wounds;  Neuro A&O x 3; good sensation; motion in all extremities  Recent central venogram shows patent left central veins  Impression: Failed right arm access needs new access  Plan: Left upper arm graft today   Risk, benefits, and alternatives to access surgery were discussed.  The patient is aware the risks include but are not limited to: bleeding, infection, steal syndrome, nerve  damage, ischemic neuropathy, failure to mature, and need for additional procedures.  The patient agrees to proceed.  Dillon Livermore E @TODAY @ 9:25 AM

## 2011-07-02 NOTE — Op Note (Addendum)
Procedure: Left Upper Arm AV graft  Preop: ESRD  Postop: ESRD  Anesthesia: Generall  Assistant: Evorn Gong, PA-C  Findings:6 mm PTFE end to end to axillary vein   Procedure Details: The left upper extremity was prepped and draped in usual sterile fashion.  A longitudinal incision was then made near the antecubital crease the left arm. The incision was carried into the subcutaneous tissues down to level of the brachial artery.  There was dense scar in this area from prior access procedures.   Next the brachial artery was dissected free in the medial portion incision. The artery was  3-4 mm in diameter. The vessel loops were placed proximal and distal to the planned site of arteriotomy. At this point, a longitudinal incision was made in the axilla and carried through the subcutaneous tissues and fascia to expose the axillary vein.  The nerves were protected.  There was a pre-existing graft in this location and this was followed up to where the axillary vein once again became patent.  The vein was approximately 4-5 mm in diameter.  It was dissected free and small side branches controlled with vessel loops.  Next, a subcutaneous tunnel was created connecting the upper arm to the lower arm incision in an arcing configuration over the biceps muscle.  A 6 mm PTFE graft was then brought through this subcutaneous tunnel. The patient was given 7000 units of intravenous heparin. After appropriate circulation time, the vessel loops were used to control the artery. A longitudinal opening was made in the left brachial artery.  The end of the graft was beveled and sewn end to side to the artery using a 6 0 prolene.  At completion of the anastomosis the artery was forward bled, backbled and thoroughly flushed.  The anastomosis was secured, vessel loops were released and there was palpable pulse in the graft.  The graft was clamped just above the arterial anastomosis with a fistula clamp. The graft was then  pulled taut to length at the axillary incision.  The axillary vein was controlled with a fine bulldog clamp on a large side branch and a henley clamp in the upper axilla.  The vein was transected and spatulated.  The distal end of the graft was then beveled and sewn end to end to the vein using a running 6 0 prolene.  Just prior to completion of the anastomosis, everything was forward bled, back bled and thoroughly flushed.  The anastomosis was secured and the fistula clamp removed from the proximal graft.  A thrill was immediately palpable in the graft. The patient was given 70 mg of protamine to assist with hemostasis.  After hemostasis was obtained, the subcutaneous tissues were reapproximated using a running 3-0 Vicryl suture. The skin was then closed with a 4 0 Vicryl subcuticular stitch. Dermabond was applied to the skin incisions.  The patient tolerated the procedure well and there were no complications.  Instrument sponge and needle count was correct at the end of the case.  The patient was taken to the recovery room in stable condition.  Ruta Hinds, MD [facility] [place] Office: [phone] Pager: [phone]

## 2011-07-03 ENCOUNTER — Encounter (HOSPITAL_COMMUNITY): Payer: Self-pay | Admitting: Vascular Surgery

## 2011-07-24 ENCOUNTER — Encounter: Payer: Self-pay | Admitting: Vascular Surgery

## 2011-07-25 ENCOUNTER — Encounter: Payer: Self-pay | Admitting: Vascular Surgery

## 2011-07-25 ENCOUNTER — Ambulatory Visit (INDEPENDENT_AMBULATORY_CARE_PROVIDER_SITE_OTHER): Payer: Medicare Other | Admitting: Vascular Surgery

## 2011-07-25 VITALS — BP 130/76 | HR 104 | Resp 20 | Ht 73.0 in | Wt 270.0 lb

## 2011-07-25 DIAGNOSIS — N186 End stage renal disease: Secondary | ICD-10-CM

## 2011-07-25 NOTE — Progress Notes (Signed)
[facility] [place] HISTORY AND PHYSICAL    History of Present Illness:  Patient is a 46 y.o. year old male who presents for post-operative follow-up after placement of a left upper arm AV graft.  This was placed 07/02/11.  He denies any numbness tingling or aching in his left hand suggestive of steal. He is currently dialyzing via a right-sided catheter.   Physical Examination  Filed Vitals:   07/25/11 1612  BP: 130/76  Pulse: 104  Resp: 20    Body mass index is 35.62 kg/(m^2).  General:  Alert and oriented, no acute distress Neck: No bruit or JVD Skin: No rash Extremities:  There is an easily audible bruit in the left upper arm AV graft. Left hand is pink and well perfused both incisions are healed Neurologic: Upper and lower extremity motor 5/5 and symmetric    ASSESSMENT: Functioning left upper arm AV graft   PLAN: Should be ready to cannulate next week followup on as-needed basis   Ruta Hinds, MD [facility] [place] Office: [phone] Pager: [phone]

## 2011-08-02 ENCOUNTER — Other Ambulatory Visit (HOSPITAL_COMMUNITY): Payer: Self-pay | Admitting: Nephrology

## 2011-08-02 ENCOUNTER — Ambulatory Visit (HOSPITAL_COMMUNITY)
Admission: RE | Admit: 2011-08-02 | Discharge: 2011-08-02 | Disposition: A | Discharge status: Home / Self Care | Payer: Medicare Other | Source: Ambulatory Visit | Attending: Nephrology | Admitting: Nephrology

## 2011-08-02 DIAGNOSIS — Z4901 Encounter for fitting and adjustment of extracorporeal dialysis catheter: Secondary | ICD-10-CM | POA: Insufficient documentation

## 2011-08-02 DIAGNOSIS — N186 End stage renal disease: Secondary | ICD-10-CM

## 2011-08-02 MED ORDER — CHLORHEXIDINE GLUCONATE 4 % EX LIQD
CUTANEOUS | Status: AC
  Filled 2011-08-02: qty 30

## 2011-09-26 DIAGNOSIS — D508 Other iron deficiency anemias: Secondary | ICD-10-CM | POA: Insufficient documentation

## 2011-12-02 ENCOUNTER — Emergency Department (HOSPITAL_COMMUNITY)
Admission: EM | Admit: 2011-12-02 | Discharge: 2011-12-03 | Disposition: A | Discharge status: Home / Self Care | Payer: Medicare Other | Attending: Emergency Medicine | Admitting: Emergency Medicine

## 2011-12-02 DIAGNOSIS — X58XXXA Exposure to other specified factors, initial encounter: Secondary | ICD-10-CM | POA: Insufficient documentation

## 2011-12-02 DIAGNOSIS — I129 Hypertensive chronic kidney disease with stage 1 through stage 4 chronic kidney disease, or unspecified chronic kidney disease: Secondary | ICD-10-CM | POA: Insufficient documentation

## 2011-12-02 DIAGNOSIS — N189 Chronic kidney disease, unspecified: Secondary | ICD-10-CM | POA: Insufficient documentation

## 2011-12-02 DIAGNOSIS — E119 Type 2 diabetes mellitus without complications: Secondary | ICD-10-CM | POA: Insufficient documentation

## 2011-12-02 DIAGNOSIS — S90426A Blister (nonthermal), unspecified lesser toe(s), initial encounter: Secondary | ICD-10-CM

## 2011-12-02 DIAGNOSIS — IMO0002 Reserved for concepts with insufficient information to code with codable children: Secondary | ICD-10-CM | POA: Insufficient documentation

## 2011-12-02 DIAGNOSIS — I509 Heart failure, unspecified: Secondary | ICD-10-CM | POA: Insufficient documentation

## 2011-12-02 DIAGNOSIS — Z87891 Personal history of nicotine dependence: Secondary | ICD-10-CM | POA: Insufficient documentation

## 2011-12-02 MED ORDER — CEPHALEXIN 250 MG PO CAPS: 250.0000 mg | ORAL_CAPSULE | Freq: Four times a day (QID) | ORAL | Status: AC

## 2011-12-02 NOTE — Discharge Instructions (Signed)
Please keep the dressing in place. Use the crutches to keep pressure off your foot. Take the antibiotic until gone. Please have the kidney doctors look at the toe when at dialysis on Wednesday. If you start running a fever, notice increased swelling/redness to the foot, or with any other worrisome symptoms, return to the ED.  Blisters Blisters are fluid-filled sacs that form within the skin. Common causes of blistering are friction, burns, and exposure to irritating chemicals. The fluid in the blister protects the underlying damaged skin. Most of the time it is not recommended that you open blisters. When a blister is opened, there is an increased chance for infection. Usually, a blister will open on its own. They then dry up and peel off within 10 days. If the blister is tense and uncomfortable (painful) the fluid may be drained. If it is drained the roof of the blister should be left intact. The draining should only be done by a medical professional under aseptic conditions. Poorly fitting shoes and boots can cause blisters by being too tight or too loose. Wearing extra socks or using tape, bandages, or pads over the blister-prone area helps prevent the problem by reducing friction. Blisters heal more slowly if you have diabetes or if you have problems with your circulation. You need to be careful about medical follow-up to prevent infection. HOME CARE INSTRUCTIONS  Protect areas where blisters have formed until the skin is healed. Use a special bandage with a hole cut in the middle around the blister. This reduces pressure and friction. When the blister breaks, trim off the loose skin and keep the area clean by washing it with soap daily. Soaking the blister or broken-open blister with diluted vinegar twice daily for 15 minutes will dry it up and speed the healing. Use 3 tablespoons of white vinegar per quart of water (45 mL white vinegar per liter of water). An antibiotic ointment and a bandage can be used  to cover the area after soaking.  SEEK MEDICAL CARE IF:   You develop increased redness, pain, swelling, or drainage in the blistered area.   You develop a pus-like discharge from the blistered area, chills, or a fever.  MAKE SURE YOU:   Understand these instructions.   Will watch your condition.   Will get help right away if you are not doing well or get worse.  Document Released: 08/08/2004 Document Revised: 06/20/2011 Document Reviewed: 07/06/2008 [facility] Patient Information 2012 [place].

## 2011-12-02 NOTE — ED Provider Notes (Signed)
History     CSN: [id number]  Arrival date & time 12/02/11  2038   First MD Initiated Contact with Patient 12/02/11 2155      Chief Complaint  Patient presents with  . Foot Problem    (Consider location/radiation/quality/duration/timing/severity/associated sxs/prior treatment) HPI Hx from pt. 33yoM with PMH ESRD, DM presents with blister to R great toe. States this appeared today. He has recently been wearing a new pair of shoes which may have caused this. He has slight peripheral neuropathy and has not had any pain in the area. Blister located underneath great toe. No drainage from the area, overlying redness.  Past Medical History  Diagnosis Date  . Diabetes mellitus   . Chronic kidney disease   . Hypertension   . Chest pain   . Morbid obesity   . CHF (congestive heart failure)     hx  . Bronchitis   . Blood transfusion   . H/O hiatal hernia     Past Surgical History  Procedure Date  . Av fistula placement 10/11/2010  . Ankle fusion     2-3 years ago  . Av fistula placement 07/02/2011    Procedure: INSERTION OF ARTERIOVENOUS (AV) GORE-TEX GRAFT ARM;  Surgeon: Elam Dutch, MD;  Location: [facility] OR;  Service: Vascular;  Laterality: Left;    Family History  Problem Relation Age of Onset  . Heart disease Mother     History  Substance Use Topics  . Smoking status: Former Smoker -- 0.5 packs/day for 20 years    Types: Cigarettes  . Smokeless tobacco: Never Used  . Alcohol Use: No      Review of Systems  Constitutional: Negative for fever and chills.  Musculoskeletal: Negative for gait problem.  Skin: Positive for wound. Negative for color change.    Allergies  Review of patient's allergies indicates no known allergies.  Home Medications   Current Outpatient Rx  Name Route Sig Dispense Refill  . NEPHRO-VITE 0.8 MG PO TABS Oral Take 0.8 mg by mouth at bedtime.      Eino Farber PLUS PO Oral Take 1-2 tablets by mouth daily as needed. For acid reflux.    .  INSULIN ASPART 100 UNIT/ML [place] SOLN Subcutaneous Inject 40-50 Units into the skin 2 (two) times daily. 40 units in the am and 50 units in pm.      BP 123/59  Pulse 112  Temp 98.8 F (37.1 C)  SpO2 99%  Physical Exam  Nursing note and vitals reviewed. Constitutional: He appears well-developed and well-nourished. No distress.  HENT:  Head: Normocephalic and atraumatic.  Neck: Normal range of motion.  Cardiovascular: Normal rate.   Pulmonary/Chest: Effort normal.  Musculoskeletal: Normal range of motion.  Neurological: He is alert.  Skin: Skin is warm and dry. He is not diaphoretic.       Small fluid filled blister underneath great toe on R foot. No drainage noted. No overlying cellulitis or swelling to the toe. No other foot wounds seen. DP/PT pulses intact.  Psychiatric: He has a normal mood and affect.    ED Course  Procedures (including critical care time)  Labs Reviewed - No data to display No results found.   1. Toe blister without infection       MDM  Pt with blister to toe. No evidence to suggest infx at this time. However given his hx of ESRD, DM, rx given for keflex. Toe placed in gauze dressing. He is due for dialysis on Weds  and I instructed him to have it rechecked while there. Return precautions discussed.        Abran Richard, [place] 12/03/11 2320

## 2011-12-04 NOTE — ED Provider Notes (Signed)
Medical screening examination/treatment/procedure(s) were performed by non-physician practitioner and as supervising physician I was immediately available for consultation/collaboration.  Kalman Drape, MD 12/04/11 [phone]

## 2012-01-13 ENCOUNTER — Encounter: Payer: Self-pay | Admitting: Internal Medicine

## 2012-01-19 ENCOUNTER — Encounter (HOSPITAL_COMMUNITY)
Admission: RE | Disposition: A | Discharge status: Home / Self Care | Payer: Self-pay | Source: Ambulatory Visit | Attending: Vascular Surgery

## 2012-01-19 ENCOUNTER — Encounter (HOSPITAL_COMMUNITY): Payer: Self-pay | Admitting: Anesthesiology

## 2012-01-19 ENCOUNTER — Ambulatory Visit (HOSPITAL_COMMUNITY)
Admission: RE | Admit: 2012-01-19 | Discharge: 2012-01-19 | Disposition: A | Discharge status: Home / Self Care | Payer: Medicare Other | Source: Ambulatory Visit | Attending: Vascular Surgery | Admitting: Vascular Surgery

## 2012-01-19 ENCOUNTER — Ambulatory Visit (HOSPITAL_COMMUNITY): Payer: Medicare Other | Admitting: Anesthesiology

## 2012-01-19 DIAGNOSIS — T82898A Other specified complication of vascular prosthetic devices, implants and grafts, initial encounter: Secondary | ICD-10-CM

## 2012-01-19 DIAGNOSIS — N186 End stage renal disease: Secondary | ICD-10-CM

## 2012-01-19 LAB — POCT I-STAT 4, (NA,K, GLUC, HGB,HCT)
HCT: 38 % — ABNORMAL LOW (ref 39.0–52.0)
Hemoglobin: 12.9 g/dL — ABNORMAL LOW (ref 13.0–17.0)
Sodium: 135 mEq/L (ref 135–145)

## 2012-01-19 SURGERY — THROMBECTOMY AND REVISION OF ARTERIOVENTOUS (AV) GORETEX  GRAFT
Anesthesia: General | Site: Arm Upper | Laterality: Left | Wound class: Clean

## 2012-01-19 MED ORDER — FENTANYL CITRATE 0.05 MG/ML IJ SOLN
INTRAMUSCULAR | Status: DC | PRN
  Administered 2012-01-19: 50 ug via INTRAVENOUS
  Administered 2012-01-19: 25 ug via INTRAVENOUS
  Administered 2012-01-19: 50 ug via INTRAVENOUS
  Administered 2012-01-19: 100 ug via INTRAVENOUS
  Administered 2012-01-19: 25 ug via INTRAVENOUS

## 2012-01-19 MED ORDER — SODIUM CHLORIDE 0.9 % IV SOLN
INTRAVENOUS | Status: DC | PRN
  Administered 2012-01-19 (×2): via INTRAVENOUS

## 2012-01-19 MED ORDER — 0.9 % SODIUM CHLORIDE (POUR BTL) OPTIME
TOPICAL | Status: DC | PRN
  Administered 2012-01-19: 1000 mL

## 2012-01-19 MED ORDER — SODIUM CHLORIDE 0.9 % IR SOLN
Status: DC | PRN
  Administered 2012-01-19: 11:00:00

## 2012-01-19 MED ORDER — LIDOCAINE HCL (CARDIAC) 20 MG/ML IV SOLN
INTRAVENOUS | Status: DC | PRN
  Administered 2012-01-19: 100 mg via INTRAVENOUS

## 2012-01-19 MED ORDER — HEPARIN SODIUM (PORCINE) 1000 UNIT/ML IJ SOLN
INTRAMUSCULAR | Status: DC | PRN
  Administered 2012-01-19: 10000 [IU] via INTRAVENOUS

## 2012-01-19 MED ORDER — ONDANSETRON HCL 4 MG/2ML IJ SOLN
INTRAMUSCULAR | Status: DC | PRN
  Administered 2012-01-19: 4 mg via INTRAVENOUS

## 2012-01-19 MED ORDER — ONDANSETRON HCL 4 MG/2ML IJ SOLN: 4.0000 mg | Freq: Once | INTRAMUSCULAR | Status: DC | PRN

## 2012-01-19 MED ORDER — PROPOFOL 10 MG/ML IV EMUL
INTRAVENOUS | Status: DC | PRN
  Administered 2012-01-19: 75 ug/kg/min via INTRAVENOUS

## 2012-01-19 MED ORDER — CEFAZOLIN SODIUM 1-5 GM-% IV SOLN
INTRAVENOUS | Status: DC | PRN
  Administered 2012-01-19: 2 g via INTRAVENOUS

## 2012-01-19 MED ORDER — OXYCODONE-ACETAMINOPHEN 7.5-325 MG PO TABS: 1.0000 | ORAL_TABLET | ORAL | Status: AC | PRN

## 2012-01-19 MED ORDER — HYDROMORPHONE HCL PF 1 MG/ML IJ SOLN: 0.2500 mg | INTRAMUSCULAR | Status: DC | PRN

## 2012-01-19 MED ORDER — OXYCODONE-ACETAMINOPHEN 5-325 MG PO TABS
1.0000 | ORAL_TABLET | ORAL | Status: DC | PRN
  Administered 2012-01-19: 2 via ORAL

## 2012-01-19 MED ORDER — MIDAZOLAM HCL 5 MG/5ML IJ SOLN
INTRAMUSCULAR | Status: DC | PRN
  Administered 2012-01-19: 2 mg via INTRAVENOUS

## 2012-01-19 MED ORDER — PROTAMINE SULFATE 10 MG/ML IV SOLN
INTRAVENOUS | Status: DC | PRN
  Administered 2012-01-19: 30 mg via INTRAVENOUS
  Administered 2012-01-19 (×2): 20 mg via INTRAVENOUS
  Administered 2012-01-19: 30 mg via INTRAVENOUS

## 2012-01-19 MED ORDER — PROPOFOL 10 MG/ML IV EMUL
INTRAVENOUS | Status: DC | PRN
  Administered 2012-01-19: 250 mg via INTRAVENOUS
  Administered 2012-01-19: 50 mg via INTRAVENOUS
  Administered 2012-01-19: 100 mg via INTRAVENOUS

## 2012-01-19 MED ORDER — PHENYLEPHRINE HCL 10 MG/ML IJ SOLN
INTRAMUSCULAR | Status: DC | PRN
  Administered 2012-01-19 (×9): 80 ug via INTRAVENOUS

## 2012-01-19 SURGICAL SUPPLY — 51 items
ADH SKN CLS APL DERMABOND .7 (GAUZE/BANDAGES/DRESSINGS) ×1
ADH SKN CLS LQ APL DERMABOND (GAUZE/BANDAGES/DRESSINGS) ×1
CANISTER SUCTION 2500CC (MISCELLANEOUS) ×2 IMPLANT
CATH EMB 4FR 80CM (CATHETERS) ×2 IMPLANT
CLIP TI MEDIUM 6 (CLIP) ×2 IMPLANT
CLIP TI WIDE RED SMALL 6 (CLIP) ×2 IMPLANT
CLOTH BEACON ORANGE TIMEOUT ST (SAFETY) ×2 IMPLANT
COVER SURGICAL LIGHT HANDLE (MISCELLANEOUS) ×4 IMPLANT
DECANTER SPIKE VIAL GLASS SM (MISCELLANEOUS) ×2 IMPLANT
DERMABOND ADHESIVE PROPEN (GAUZE/BANDAGES/DRESSINGS) ×1
DERMABOND ADVANCED (GAUZE/BANDAGES/DRESSINGS) ×1
DERMABOND ADVANCED .7 DNX12 (GAUZE/BANDAGES/DRESSINGS) ×1 IMPLANT
DERMABOND ADVANCED .7 DNX6 (GAUZE/BANDAGES/DRESSINGS) IMPLANT
DRAPE X-RAY CASS 24X20 (DRAPES) IMPLANT
ELECT REM PT RETURN 9FT ADLT (ELECTROSURGICAL) ×2
ELECTRODE REM PT RTRN 9FT ADLT (ELECTROSURGICAL) ×1 IMPLANT
GAUZE SPONGE 2X2 8PLY STRL LF (GAUZE/BANDAGES/DRESSINGS) ×1 IMPLANT
GEL ULTRASOUND 20GR AQUASONIC (MISCELLANEOUS) IMPLANT
GLOVE BIO SURGEON STRL SZ 6.5 (GLOVE) ×2 IMPLANT
GLOVE BIO SURGEON STRL SZ7 (GLOVE) ×1 IMPLANT
GLOVE BIO SURGEON STRL SZ7.5 (GLOVE) ×2 IMPLANT
GLOVE BIOGEL PI IND STRL 6.5 (GLOVE) IMPLANT
GLOVE BIOGEL PI IND STRL 7.0 (GLOVE) IMPLANT
GLOVE BIOGEL PI INDICATOR 6.5 (GLOVE) ×1
GLOVE BIOGEL PI INDICATOR 7.0 (GLOVE) ×1
GOWN PREVENTION PLUS XLARGE (GOWN DISPOSABLE) ×2 IMPLANT
GOWN STRL NON-REIN LRG LVL3 (GOWN DISPOSABLE) ×4 IMPLANT
GRAFT GORETEX 6X10 (Vascular Products) ×1 IMPLANT
KIT BASIN OR (CUSTOM PROCEDURE TRAY) ×2 IMPLANT
KIT ROOM TURNOVER OR (KITS) ×2 IMPLANT
LOOP VESSEL MAXI BLUE (MISCELLANEOUS) ×2 IMPLANT
LOOP VESSEL MINI RED (MISCELLANEOUS) IMPLANT
NS IRRIG 1000ML POUR BTL (IV SOLUTION) ×2 IMPLANT
PACK CV ACCESS (CUSTOM PROCEDURE TRAY) ×2 IMPLANT
PAD ARMBOARD 7.5X6 YLW CONV (MISCELLANEOUS) ×4 IMPLANT
SET COLLECT BLD 21X3/4 12 (NEEDLE) IMPLANT
SPONGE GAUZE 2X2 STER 10/PKG (GAUZE/BANDAGES/DRESSINGS) ×1
SPONGE GAUZE 4X4 12PLY (GAUZE/BANDAGES/DRESSINGS) ×1 IMPLANT
SPONGE LAP 18X18 X RAY DECT (DISPOSABLE) ×1 IMPLANT
SPONGE SURGIFOAM ABS GEL 100 (HEMOSTASIS) IMPLANT
STOPCOCK 4 WAY LG BORE MALE ST (IV SETS) IMPLANT
SUT PROLENE 6 0 CC (SUTURE) ×3 IMPLANT
SUT VIC AB 3-0 SH 27 (SUTURE) ×2
SUT VIC AB 3-0 SH 27X BRD (SUTURE) ×1 IMPLANT
SUT VICRYL 4-0 PS2 18IN ABS (SUTURE) ×2 IMPLANT
TAPE CLOTH SURG 4X10 WHT LF (GAUZE/BANDAGES/DRESSINGS) ×1 IMPLANT
TOWEL OR 17X24 6PK STRL BLUE (TOWEL DISPOSABLE) ×2 IMPLANT
TOWEL OR 17X26 10 PK STRL BLUE (TOWEL DISPOSABLE) ×2 IMPLANT
TUBING EXTENTION W/L.L. (IV SETS) IMPLANT
UNDERPAD 30X30 INCONTINENT (UNDERPADS AND DIAPERS) ×2 IMPLANT
WATER STERILE IRR 1000ML POUR (IV SOLUTION) ×2 IMPLANT

## 2012-01-19 NOTE — Op Note (Signed)
Procedure: Thrombectomy and revision of left upper arm AV graft  Preoperative diagnosis: Thrombosed AV graft left arm  Postoperative diagnosis: Same  Anesthesia: Local with IV sedation  Assistant: Rose Phi RNFA  Operative findings: Greater than 6 mm outflow vein, revised to 6 mm interposition graft  Operative details: After obtaining informed consent, the patient was taken to the operating room. The patient was placed in supine position on the operating room table. After adequate sedation, the patient's entire right upper extremity was prepped and draped in the usual sterile fashion. Local anesthesia was infiltrated at a pre-existing scar in the axilla. A longitudinal incision was made in this location through a pre-existing scar. The incision was carried into the subcutaneous tissues down to level the graft. The graft was dissected free circumferentially. Dissection was carried down to the level of the venous anastomosis. This was a good quality axillary vein between 6 mm in diameter. This was dissected free circumferentially. There were several large side branches 3 mm in diameter controlled with vessel loops.  The patient was given 10,000 units of intravenous heparin. A transverse graftotomy was made just above the level of the anastomosis. A #4 Fogarty catheter was used to thrombectomize the venous limb of the graft. There was some venous backbleeding but there were still intimal hyperplasia within the vein and the vein was quite thickened. Therefore the vein was opened longitudinally past the segment intimal hyperplasia. The thickened portion of vein was debrided away. The arterial limb the graft was then thrombectomized with a #4 Fogarty catheter excellent arterial inflow was obtained. This was clamped proximally with a fistula clamp. A new 6 mm interposition graft was brought up in the operative field and sewn end-to-end to the vein using a running 6-0 Prolene suture. At completion of the  anastomosis the venous limb was thoroughly flushed with heparinized saline and reoccluded. The graft was cut to length in preparation for sewing to the proximal graft. This was then sewn end-to-end to the proximal arterial limb of the graft using running 6-0 Prolene suture. Just prior to completion, the anastomosis was forebled backbled and thoroughly flushed. The anastomosis was secured; clamps were released; and there was a palpable thrill in the graft immediately. Hemostasis was obtained with direct pressure and the assistance of 100 mg of protamine. The subcutaneous tissues were reapproximated with running 3-0 Vicryl suture. The skin was closed with a 4 0 Vicryl subcuticular stitch. Dermabond was applied the incision. The patient tolerated the procedure well and there were no complications. Instrument sponge and needle counts were correct at the end of the case. The patient was taken to the recovery room in stable condition.The patient had an audible radial doppler signal at the end of the case.  Ruta Hinds, MD [facility] [place] Office: [phone] Pager: [phone]

## 2012-01-19 NOTE — Anesthesia Postprocedure Evaluation (Signed)
  Anesthesia Post-op Note  Patient: [name]  Procedure(s) Performed: Procedure(s) (LRB): THROMBECTOMY AND REVISION OF ARTERIOVENTOUS (AV) GORETEX  GRAFT (Left)  Patient Location: PACU  Anesthesia Type: General  Level of Consciousness: awake, alert  and oriented  Airway and Oxygen Therapy: Patient Spontanous Breathing and Patient connected to nasal cannula oxygen  Post-op Pain: mild  Post-op Assessment: Post-op Vital signs reviewed  Post-op Vital Signs: Reviewed  Complications: No apparent anesthesia complications

## 2012-01-19 NOTE — Preoperative (Signed)
Beta Blockers   Reason not to administer Beta Blockers:Not Applicable 

## 2012-01-19 NOTE — OR Nursing (Signed)
Unable to palpate thrill, or hear bruit.  Able to hear faint bruit with doppler. Dr. Oneida Alar aware.  No new orders.

## 2012-01-19 NOTE — Anesthesia Preprocedure Evaluation (Addendum)
Anesthesia Evaluation  Patient identified by MRN, date of birth, ID band Patient awake    Reviewed: Allergy & Precautions, H&P , NPO status , Patient's Chart, lab work & pertinent test results  History of Anesthesia Complications Negative for: history of anesthetic complications  Airway Mallampati: II TM Distance: >3 FB Neck ROM: Full    Dental  (+) Teeth Intact and Dental Advisory Given   Pulmonary Recent URI , Residual Cough, former smoker,  breath sounds clear to auscultation        Cardiovascular hypertension, + Peripheral Vascular Disease and +CHF Rhythm:Regular Rate:Normal  2D echo 2004 LEFT VENTRICLE:  -  The left ventricle was dilated.  -  Overall left ventricular systolic function was markedly        decreased.  -  Left ventricular ejection fraction was estimated , range being 20        % to 30 %.  -  There was diffuse left ventricular hypokinesis with regional        variations, but without diagnostic evidence for regional wall        motion abnormalities.  -  Left ventricular wall thickness was increased.   Doppler interpretation(s):  -  Doppler parameters were consistent with abnormal left ventricular        relaxation.    Neuro/Psych negative neurological ROS  negative psych ROS   GI/Hepatic hiatal hernia, GERD-  Controlled,  Endo/Other  Well Controlled, Type 2, Insulin DependentMorbid obesity  Renal/GU ESRF and DialysisRenal disease (M/W/F; last tx was Friday)  negative genitourinary   Musculoskeletal negative musculoskeletal ROS (+)   Abdominal (+) + obese,   Peds negative pediatric ROS (+)  Hematology negative hematology ROS (+)   Anesthesia Other Findings   Reproductive/Obstetrics                         Anesthesia Physical Anesthesia Plan  ASA: III  Anesthesia Plan: MAC and General   Post-op Pain Management:    Induction: Intravenous  Airway Management Planned:  LMA  Additional Equipment:   Intra-op Plan:   Post-operative Plan: Extubation in OR  Informed Consent: I have reviewed the patients History and Physical, chart, labs and discussed the procedure including the risks, benefits and alternatives for the proposed anesthesia with the patient or authorized representative who has indicated his/her understanding and acceptance.   Dental advisory given  Plan Discussed with: CRNA, Anesthesiologist and Surgeon  Anesthesia Plan Comments:        Anesthesia Quick Evaluation

## 2012-01-19 NOTE — Transfer of Care (Signed)
Immediate Anesthesia Transfer of Care Note  Patient: [name]  Procedure(s) Performed: Procedure(s) (LRB): THROMBECTOMY AND REVISION OF ARTERIOVENTOUS (AV) GORETEX  GRAFT (Left)  Patient Location: PACU  Anesthesia Type: General  Level of Consciousness: awake, alert , oriented and patient cooperative  Airway & Oxygen Therapy: Patient Spontanous Breathing and Patient connected to nasal cannula oxygen  Post-op Assessment: Report given to PACU RN  Post vital signs: Reviewed and stable  Complications: No apparent anesthesia complications

## 2012-01-19 NOTE — Anesthesia Procedure Notes (Signed)
Procedure Name: LMA Insertion Date/Time: 01/19/2012 10:01 AM Performed by: Luane School A Pre-anesthesia Checklist: Patient identified Patient Re-evaluated:Patient Re-evaluated prior to inductionOxygen Delivery Method: Circle system utilized Preoxygenation: Pre-oxygenation with 100% oxygen Intubation Type: IV induction LMA: LMA inserted LMA Size: 5.0 Tube type: Oral Number of attempts: 1 Placement Confirmation: positive ETCO2 and breath sounds checked- equal and bilateral Tube secured with: Tape Dental Injury: Teeth and Oropharynx as per pre-operative assessment

## 2012-01-19 NOTE — H&P (Addendum)
[facility] SHORT STAY H&P  CC:  Clotted left arm graft, asked to see by Dr Elnita Maxwell renal service  HPI: Left arm graft clotted.  Recently had near infiltration. Also slept on graft.  No prior clotting events after graft placed about 6 months ago.  Past Medical History  Diagnosis Date  . Diabetes mellitus   . Chronic kidney disease   . Hypertension   . Chest pain   . Morbid obesity   . CHF (congestive heart failure)     hx  . Bronchitis   . Blood transfusion   . H/O hiatal hernia     FH:  Non-Contributory  Social HX History  Substance Use Topics  . Smoking status: Former Smoker -- 0.5 packs/day for 20 years    Types: Cigarettes  . Smokeless tobacco: Never Used  . Alcohol Use: No    Allergies No Known Allergies  Medications No current facility-administered medications on file prior to encounter.   Current Outpatient Prescriptions on File Prior to Encounter  Medication Sig Dispense Refill  . b complex-vitamin c-folic acid (NEPHRO-VITE) 0.8 MG TABS Take 0.8 mg by mouth at bedtime.        . Calcium Carbonate-Simethicone (TUMS PLUS PO) Take 1-2 tablets by mouth daily as needed. For acid reflux.      . insulin aspart (NOVOLOG) 100 UNIT/ML injection Inject 40-50 Units into the skin 2 (two) times daily. 40 units in the am and 50 units in pm.       PHYSICAL EXAM  There were no vitals filed for this visit.  General:  WDWN in NAD HENT: WNL Eyes: Pupils equal Pulmonary: normal non-labored breathing , without Rales, rhonchi,  wheezing Cardiac: RRR Vascular Exam/Pulses:  Multiple grafts left arm, all occluded, outer upper arm graft is currently in use and is occluded Extremities without ischemic changes, no Gangrene , no cellulitis; no open wounds;  Neuro A&O x 3; good sensation; motion in all extremities  Impression: Thrombosed left upper arm AV graft  Plan: Thrombectomy possible revision today  Jabez Molner E @TODAY @ 8:16 AM

## 2012-01-20 ENCOUNTER — Encounter (HOSPITAL_COMMUNITY): Payer: Self-pay | Admitting: Anesthesiology

## 2012-01-20 ENCOUNTER — Ambulatory Visit (HOSPITAL_COMMUNITY): Payer: Medicare Other

## 2012-01-20 ENCOUNTER — Other Ambulatory Visit: Payer: Self-pay

## 2012-01-20 ENCOUNTER — Ambulatory Visit (HOSPITAL_COMMUNITY)
Admission: AD | Admit: 2012-01-20 | Discharge: 2012-01-20 | Disposition: A | Discharge status: Home / Self Care | Payer: Medicare Other | Source: Ambulatory Visit | Attending: Surgery | Admitting: Surgery

## 2012-01-20 ENCOUNTER — Encounter (HOSPITAL_COMMUNITY)
Admission: AD | Disposition: A | Discharge status: Home / Self Care | Payer: Self-pay | Source: Ambulatory Visit | Attending: Surgery

## 2012-01-20 ENCOUNTER — Ambulatory Visit (HOSPITAL_COMMUNITY): Payer: Medicare Other | Admitting: Anesthesiology

## 2012-01-20 ENCOUNTER — Encounter (HOSPITAL_COMMUNITY): Payer: Self-pay | Admitting: *Deleted

## 2012-01-20 DIAGNOSIS — Y849 Medical procedure, unspecified as the cause of abnormal reaction of the patient, or of later complication, without mention of misadventure at the time of the procedure: Secondary | ICD-10-CM | POA: Insufficient documentation

## 2012-01-20 DIAGNOSIS — N186 End stage renal disease: Secondary | ICD-10-CM | POA: Insufficient documentation

## 2012-01-20 DIAGNOSIS — Z992 Dependence on renal dialysis: Secondary | ICD-10-CM | POA: Insufficient documentation

## 2012-01-20 DIAGNOSIS — E119 Type 2 diabetes mellitus without complications: Secondary | ICD-10-CM | POA: Insufficient documentation

## 2012-01-20 DIAGNOSIS — T82898A Other specified complication of vascular prosthetic devices, implants and grafts, initial encounter: Secondary | ICD-10-CM | POA: Insufficient documentation

## 2012-01-20 DIAGNOSIS — I120 Hypertensive chronic kidney disease with stage 5 chronic kidney disease or end stage renal disease: Secondary | ICD-10-CM | POA: Insufficient documentation

## 2012-01-20 HISTORY — PX: INSERTION OF DIALYSIS CATHETER: SHX1324

## 2012-01-20 LAB — POCT I-STAT 4, (NA,K, GLUC, HGB,HCT): Hemoglobin: 11.6 g/dL — ABNORMAL LOW (ref 13.0–17.0)

## 2012-01-20 LAB — GLUCOSE, CAPILLARY: Glucose-Capillary: 197 mg/dL — ABNORMAL HIGH (ref 70–99)

## 2012-01-20 SURGERY — INSERTION OF DIALYSIS CATHETER
Anesthesia: General | Wound class: Clean

## 2012-01-20 MED ORDER — MUPIROCIN 2 % EX OINT
TOPICAL_OINTMENT | Freq: Two times a day (BID) | CUTANEOUS | Status: DC
  Administered 2012-01-20: 1 via NASAL
  Filled 2012-01-20: qty 22

## 2012-01-20 MED ORDER — PROPOFOL 10 MG/ML IV EMUL
INTRAVENOUS | Status: DC | PRN
  Administered 2012-01-20: 200 mg via INTRAVENOUS

## 2012-01-20 MED ORDER — LIDOCAINE-EPINEPHRINE (PF) 1 %-1:200000 IJ SOLN
INTRAMUSCULAR | Status: AC
  Filled 2012-01-20: qty 10

## 2012-01-20 MED ORDER — DROPERIDOL 2.5 MG/ML IJ SOLN: 0.6250 mg | INTRAMUSCULAR | Status: DC | PRN

## 2012-01-20 MED ORDER — DEXTROSE 5 % IV SOLN: 3.0000 g | Freq: Once | INTRAVENOUS | Status: DC

## 2012-01-20 MED ORDER — FENTANYL CITRATE 0.05 MG/ML IJ SOLN
INTRAMUSCULAR | Status: DC | PRN
  Administered 2012-01-20: 100 ug via INTRAVENOUS

## 2012-01-20 MED ORDER — HYDROMORPHONE HCL PF 1 MG/ML IJ SOLN
INTRAMUSCULAR | Status: AC
  Administered 2012-01-20: 0.25 mg via INTRAVENOUS
  Filled 2012-01-20: qty 1

## 2012-01-20 MED ORDER — LIDOCAINE-EPINEPHRINE (PF) 1 %-1:200000 IJ SOLN
INTRAMUSCULAR | Status: DC | PRN
  Administered 2012-01-20: 10 mL via INTRADERMAL

## 2012-01-20 MED ORDER — HYDROMORPHONE HCL PF 1 MG/ML IJ SOLN
0.2500 mg | INTRAMUSCULAR | Status: DC | PRN
  Administered 2012-01-20: 0.25 mg via INTRAVENOUS

## 2012-01-20 MED ORDER — INSULIN ASPART 100 UNIT/ML ~~LOC~~ SOLN: 5.0000 [IU] | Freq: Once | SUBCUTANEOUS | Status: DC

## 2012-01-20 MED ORDER — SODIUM CHLORIDE 0.9 % IV SOLN
INTRAVENOUS | Status: DC
  Administered 2012-01-20: 14:00:00 via INTRAVENOUS

## 2012-01-20 MED ORDER — SODIUM CHLORIDE 0.9 % IR SOLN
Status: DC | PRN
  Administered 2012-01-20: 15:00:00

## 2012-01-20 MED ORDER — MIDAZOLAM HCL 5 MG/5ML IJ SOLN
INTRAMUSCULAR | Status: DC | PRN
  Administered 2012-01-20: 2 mg via INTRAVENOUS

## 2012-01-20 MED ORDER — HEPARIN SODIUM (PORCINE) 1000 UNIT/ML IJ SOLN
INTRAMUSCULAR | Status: DC | PRN
  Administered 2012-01-20: 4.6 mL

## 2012-01-20 MED ORDER — MUPIROCIN 2 % EX OINT
TOPICAL_OINTMENT | CUTANEOUS | Status: AC
  Administered 2012-01-20: 1 via NASAL
  Filled 2012-01-20: qty 22

## 2012-01-20 MED ORDER — SODIUM CHLORIDE 0.9 % IV SOLN
INTRAVENOUS | Status: DC | PRN
  Administered 2012-01-20: 14:00:00 via INTRAVENOUS

## 2012-01-20 MED ORDER — CEFAZOLIN SODIUM-DEXTROSE 2-3 GM-% IV SOLR: 2.0000 g | Freq: Once | INTRAVENOUS | Status: DC

## 2012-01-20 MED ORDER — HEPARIN SODIUM (PORCINE) 1000 UNIT/ML IJ SOLN
INTRAMUSCULAR | Status: AC
  Filled 2012-01-20: qty 1

## 2012-01-20 MED ORDER — SODIUM CHLORIDE 0.9 % IR SOLN
Status: DC | PRN
  Administered 2012-01-20: 1000 mL

## 2012-01-20 MED ORDER — PHENYLEPHRINE HCL 10 MG/ML IJ SOLN
INTRAMUSCULAR | Status: DC | PRN
  Administered 2012-01-20: 40 ug via INTRAVENOUS
  Administered 2012-01-20: 80 ug via INTRAVENOUS

## 2012-01-20 MED ORDER — SODIUM POLYSTYRENE SULFONATE 15 GM/60ML PO SUSP
60.0000 g | Freq: Once | ORAL | Status: AC
  Administered 2012-01-20: 60 g via ORAL
  Filled 2012-01-20: qty 240

## 2012-01-20 MED ORDER — HEPARIN SODIUM (PORCINE) 1000 UNIT/ML IJ SOLN
1000.0000 [IU] | Freq: Once | INTRAMUSCULAR | Status: AC
  Administered 2012-01-20: 1000 [IU] via INTRAVENOUS

## 2012-01-20 MED ORDER — CEFAZOLIN SODIUM 1-5 GM-% IV SOLN: 1.0000 g | Freq: Once | INTRAVENOUS | Status: DC

## 2012-01-20 SURGICAL SUPPLY — 50 items
ADH SKN CLS APL DERMABOND .7 (GAUZE/BANDAGES/DRESSINGS) ×1
BAG BANDED W/RUBBER/TAPE 36X54 (MISCELLANEOUS) ×2 IMPLANT
BAG DECANTER FOR FLEXI CONT (MISCELLANEOUS) ×2 IMPLANT
BAG EQP BAND 135X91 W/RBR TAPE (MISCELLANEOUS) ×2
CATH BEACON 5.038 65CM KMP-01 (CATHETERS) ×1 IMPLANT
CATH CANNON HEMO 15F 50CM (CATHETERS) IMPLANT
CATH CANNON HEMO 15FR 19 (HEMODIALYSIS SUPPLIES) IMPLANT
CATH CANNON HEMO 15FR 23CM (HEMODIALYSIS SUPPLIES) ×1 IMPLANT
CATH CANNON HEMO 15FR 31CM (HEMODIALYSIS SUPPLIES) IMPLANT
CATH CANNON HEMO 15FR 32 (HEMODIALYSIS SUPPLIES) IMPLANT
CATH CANNON HEMO 15FR 32CM (HEMODIALYSIS SUPPLIES) IMPLANT
CLOTH BEACON ORANGE TIMEOUT ST (SAFETY) ×2 IMPLANT
COVER PROBE W GEL 5X96 (DRAPES) ×2 IMPLANT
DERMABOND ADVANCED (GAUZE/BANDAGES/DRESSINGS) ×1
DERMABOND ADVANCED .7 DNX12 (GAUZE/BANDAGES/DRESSINGS) ×1 IMPLANT
DRAPE C-ARM 42X72 X-RAY (DRAPES) ×2 IMPLANT
DRAPE CHEST BREAST 15X10 FENES (DRAPES) ×2 IMPLANT
GAUZE SPONGE 2X2 8PLY STRL LF (GAUZE/BANDAGES/DRESSINGS) IMPLANT
GAUZE SPONGE 4X4 16PLY XRAY LF (GAUZE/BANDAGES/DRESSINGS) ×2 IMPLANT
GLOVE BIOGEL PI IND STRL 7.5 (GLOVE) ×1 IMPLANT
GLOVE BIOGEL PI INDICATOR 7.5 (GLOVE) ×1
GLOVE SURG SS PI 7.5 STRL IVOR (GLOVE) ×2 IMPLANT
GOWN PREVENTION PLUS XXLARGE (GOWN DISPOSABLE) ×2 IMPLANT
GOWN STRL NON-REIN LRG LVL3 (GOWN DISPOSABLE) ×2 IMPLANT
KIT BASIN OR (CUSTOM PROCEDURE TRAY) ×2 IMPLANT
KIT ROOM TURNOVER OR (KITS) ×2 IMPLANT
NDL 18GX1X1/2 (RX/OR ONLY) (NEEDLE) ×1 IMPLANT
NDL HYPO 25GX1X1/2 BEV (NEEDLE) ×1 IMPLANT
NEEDLE 18GX1X1/2 (RX/OR ONLY) (NEEDLE) ×2 IMPLANT
NEEDLE HYPO 25GX1X1/2 BEV (NEEDLE) ×2 IMPLANT
NS IRRIG 1000ML POUR BTL (IV SOLUTION) ×2 IMPLANT
PACK SURGICAL SETUP 50X90 (CUSTOM PROCEDURE TRAY) ×2 IMPLANT
PAD ARMBOARD 7.5X6 YLW CONV (MISCELLANEOUS) ×4 IMPLANT
SET MICROPUNCTURE 5F STIFF (MISCELLANEOUS) ×1 IMPLANT
SNAP KAP ×1 IMPLANT
SOAP 2 % CHG 4 OZ (WOUND CARE) ×2 IMPLANT
SPONGE GAUZE 2X2 STER 10/PKG (GAUZE/BANDAGES/DRESSINGS) ×1
SUT ETHILON 3 0 PS 1 (SUTURE) ×2 IMPLANT
SUT VICRYL 4-0 PS2 18IN ABS (SUTURE) ×2 IMPLANT
SYR 20CC LL (SYRINGE) ×4 IMPLANT
SYR 30ML LL (SYRINGE) IMPLANT
SYR 5ML LL (SYRINGE) ×2 IMPLANT
SYR CONTROL 10ML LL (SYRINGE) ×2 IMPLANT
SYRINGE 10CC LL (SYRINGE) ×2 IMPLANT
TAPE CLOTH SURG 4X10 WHT LF (GAUZE/BANDAGES/DRESSINGS) ×1 IMPLANT
TOWEL OR 17X24 6PK STRL BLUE (TOWEL DISPOSABLE) ×2 IMPLANT
TOWEL OR 17X26 10 PK STRL BLUE (TOWEL DISPOSABLE) ×2 IMPLANT
WATER STERILE IRR 1000ML POUR (IV SOLUTION) ×2 IMPLANT
WIRE AMPLATZ SS-J .035X180CM (WIRE) ×1 IMPLANT
WIRE BENTSON .035X145CM (WIRE) ×1 IMPLANT

## 2012-01-20 NOTE — Transfer of Care (Signed)
Immediate Anesthesia Transfer of Care Note  Patient: [name]  Procedure(s) Performed: Procedure(s) (LRB): INSERTION OF DIALYSIS CATHETER (N/A)  Patient Location: PACU  Anesthesia Type: General  Level of Consciousness: awake and sedated  Airway & Oxygen Therapy: Patient Spontanous Breathing and Patient connected to face mask oxygen  Post-op Assessment: Report given to PACU RN and Post -op Vital signs reviewed and stable  Post vital signs: Reviewed and stable  Complications: No apparent anesthesia complications

## 2012-01-20 NOTE — Anesthesia Postprocedure Evaluation (Signed)
Anesthesia Post Note  Patient: [name]  Procedure(s) Performed: Procedure(s) (LRB): INSERTION OF DIALYSIS CATHETER (N/A)  Anesthesia type: general  Patient location: PACU  Post pain: Pain level controlled  Post assessment: Patient's Cardiovascular Status Stable  Last Vitals:  Filed Vitals:   01/20/12 1730  BP: 113/62  Pulse: 87  Temp:   Resp: 11    Post vital signs: Reviewed and stable  Level of consciousness: sedated  Complications: No apparent anesthesia complications

## 2012-01-20 NOTE — Op Note (Signed)
[facility] [place]  Patient name: [name] MRN: [id number] DOB: Jan 18, 1966 Sex: male  01/20/2012 Pre-operative Diagnosis: End-stage renal disease Post-operative diagnosis:  Same Surgeon:  Eldridge Abrahams Assistants:  None Procedure:   Ultrasound-guided placement of a dietary catheter Anesthesia:  Gen. Blood Loss:  See anesthesia record Specimens:  None  Findings:  Could not advance a wire centrally from the right side. I therefore elected to place a left-sided catheter  Indications:  The patient recently underwent thrombectomy by Dr. Oneida Alar which occluded early and therefore he comes in for catheter placement.  Procedure:  The patient was identified in the holding area and taken to [place] [facility]  The patient was then placed supine on the table. general anesthesia was administered.  The patient was prepped and draped in the usual sterile fashion.  A time out was called and antibiotics were administered.  I initially attempted to access the right internal jugular vein under ultrasound guidance. I was able to get into the vein however I could not direct the wire centrally. The wire continued to go retrograde up the internal jugular vein. I therefore elected to abort the right side and go to the left side. A 11 blade was used to make a skin nick and the left internal jugular vein which by ultrasound is widely patent and easily compressible was accessed under ultrasound guidance with an 18-gauge needle. A wire was advanced to the right atrium without resistance. I then placed a KM P. catheter over the wire and tried to navigated into the inferior vena cava. I had difficulty doing this and therefore elected to leave the wire in the right atrium. There were arrhythmias with wire advanced further into the heart. I used a Amplatz superstiff wire. The subcutaneous tract was dilated with sequential dilators and peel-away sheath was placed. A 23 cm dietary catheter was loaded  over the wire inserted through the peel-away sheath and wire were then removed. A site was selected to be the skin exit site. A #11 blade was used to make a skin nick. The subcutaneous tunnel was created and dilated. The cath was brought right through the tunnel and the cuff situated the skin exit site.  The catheter was then connected to the ports. Both ports flushed and aspirated without difficulty. Fluoroscopy was used to confirm that there were no canes within the catheter and the tip was at the cavoatrial junction. The catheter sewn in place with a 3-0 nylon. The skin incisions in the neck were closed with 4-0 Vicryl. Dermabond placed on the wound. There were no complications.   Disposition:  To PACU in stable condition.   Theotis Burrow, M.D. [facility] [place] Office: [phone] Pager:  [phone]

## 2012-01-20 NOTE — OR Nursing (Addendum)
5.6 fluoro time, 11.5 gy2 insertion of dialysis catheter

## 2012-01-20 NOTE — H&P (View-Only) (Signed)
[facility] [place]   Patient name: [name] MRN: [id number] DOB: [birth year] Sex: male    No chief complaint on file.   HISTORY OF PRESENT ILLNESS: Patient recently underwent attempt at thrombectomy for an occluded graft.  This was found to be occluded today.  He is not a candidate for revision.  He is here for a catheter.  Past Medical History  Diagnosis Date  . Diabetes mellitus   . Hypertension   . Chest pain   . Morbid obesity   . CHF (congestive heart failure)     hx  . Bronchitis   . Blood transfusion   . H/O hiatal hernia   . Chronic kidney disease     dialysis, M-W-F, [facility]    Past Surgical History  Procedure Date  . Av fistula placement 10/11/2010  . Ankle fusion     2-3 years ago  . Av fistula placement 07/02/2011    Procedure: INSERTION OF ARTERIOVENOUS (AV) GORE-TEX GRAFT ARM;  Surgeon: Elam Dutch, MD;  Location: [facility];  Service: Vascular;  Laterality: Left;  . Thrombectomy     History   Social History  . Marital Status: Legally Separated    Spouse Name: N/A    Number of Children: N/A  . Years of Education: N/A   Occupational History  . Not on file.   Social History Main Topics  . Smoking status: Former Smoker -- 0.5 packs/day for 20 years    Types: Cigarettes  . Smokeless tobacco: Never Used  . Alcohol Use: No  . Drug Use: Yes    Special: Marijuana  . Sexually Active: Not on file   Other Topics Concern  . Not on file   Social History Narrative  . No narrative on file    Family History  Problem Relation Age of Onset  . Heart disease Mother     Allergies as of 01/20/2012  . (No Known Allergies)    Current Facility-Administered Medications on File Prior to Encounter  Medication Dose Route Frequency Provider Last Rate Last Dose  . DISCONTD: HYDROmorphone (DILAUDID) injection 0.25-0.5 mg  0.25-0.5 mg Intravenous Q5 min PRN Napoleon Form, MD      . DISCONTD: ondansetron [facility]) injection 4 mg  4  mg Intravenous Once PRN Napoleon Form, MD      . DISCONTD: oxyCODONE-acetaminophen (PERCOCET) 5-325 MG per tablet 1-2 tablet  1-2 tablet Oral Q4H PRN Elam Dutch, MD   2 tablet at 01/19/12 1400   Current Outpatient Prescriptions on File Prior to Encounter  Medication Sig Dispense Refill  . b complex-vitamin c-folic acid (NEPHRO-VITE) 0.8 MG TABS Take 0.8 mg by mouth at bedtime.        . Calcium Carbonate-Simethicone (TUMS PLUS PO) Take 1-2 tablets by mouth daily as needed. For acid reflux.      . insulin aspart protamine-insulin aspart (NOVOLOG 70/30) (70-30) 100 UNIT/ML injection Inject 40-50 Units into the skin 2 (two) times daily with a meal. Take 40 units in the morning and 50 units in the evening      . oxyCODONE-acetaminophen (PERCOCET) 7.5-325 MG per tablet Take 1 tablet by mouth every 4 (four) hours as needed for pain.  15 tablet  0     REVIEW OF SYSTEMS: No CP or SOB  PHYSICAL EXAMINATION:   Vital signs are BP 126/78  Pulse 90  Temp 97.7 F (36.5 C) (Oral)  Resp 20  Ht 6\' 1"  (1.854 m)  Wt 250 lb (113.399 kg)  BMI 32.98 kg/m2  SpO2 100% General: The patient appears their stated age. HEENT:  No gross abnormalities Pulmonary:  Non labored breathing Musculoskeletal: There are no major deformities. Neurologic: No focal weakness or paresthesias are detected, Skin: There are no ulcer or rashes noted. Psychiatric: The patient has normal affect. Cardiovascular: There is a regular rate and rhythm without significant murmur appreciated.   Diagnostic Studies None  Assessment: ESRD Plan: Diatek catheter placement.  Discussed with patient that this may need to be paced in the groin if no other options exist  V. Leia Alf, M.D. [facility] [place] Office: [phone] Pager:  [phone]

## 2012-01-20 NOTE — Progress Notes (Addendum)
Dr. Tamala Julian gone for day per OR, Dr. Conrad Montz taking over-notified him of Dr. Tamala Julian order and that we have been trying to start an IV, he wants me to call IV team since [facility] RNs unsuccessful after 3 attempts. If IV Team are not able to put IV in then will call him back and he said they would take care of it over in OR. Paged IV Team.   Iv team returned call and was on way to put IV in. I rechecked CBG and it was 248. Called Dr. Conrad Chelan and he said to DC prior IV insulin order since we were not able to carry it out and CBG is lower now.  Notified Holding RN [place] of what had transpired with this patient and last CBG.

## 2012-01-20 NOTE — Interval H&P Note (Signed)
History and Physical Interval Note:  01/20/2012 2:14 PM  [name]  has presented today for surgery, with the diagnosis of ESRD  The various methods of treatment have been discussed with the patient and family. After consideration of risks, benefits and other options for treatment, the patient has consented to  Procedure(s) (LRB): INSERTION OF DIALYSIS CATHETER (N/A) as a surgical intervention .  The patient's history has been reviewed, patient examined, no change in status, stable for surgery.  I have reviewed the patients' chart and labs.  Questions were answered to the patient's satisfaction.     Tyneka Scafidi IV, V. WELLS

## 2012-01-20 NOTE — Progress Notes (Signed)
[facility] [place]   Patient name: [name] MRN: [id number] DOB: 1965-08-04 Sex: male    No chief complaint on file.   HISTORY OF PRESENT ILLNESS: Patient recently underwent attempt at thrombectomy for an occluded graft.  This was found to be occluded today.  He is not a candidate for revision.  He is here for a catheter.  Past Medical History  Diagnosis Date  . Diabetes mellitus   . Hypertension   . Chest pain   . Morbid obesity   . CHF (congestive heart failure)     hx  . Bronchitis   . Blood transfusion   . H/O hiatal hernia   . Chronic kidney disease     d[facility], [facility]    Past Surgical History  Procedure Date  . Av fistula placement 10/11/2010  . Ankle fusion     2-3 years ago  . Av fistula placement 07/02/2011    Procedure: INSERTION OF ARTERIOVENOUS (AV) GORE-TEX GRAFT ARM;  Surgeon: Elam Dutch, MD;  Location: [facility];  Service: Vascular;  Laterality: Left;  . Thrombectomy     History   Social History  . Marital Status: Legally Separated    Spouse Name: N/A    Number of Children: N/A  . Years of Education: N/A   Occupational History  . Not on file.   Social History Main Topics  . Smoking status: Former Smoker -- 0.5 packs/day for 20 years    Types: Cigarettes  . Smokeless tobacco: Never Used  . Alcohol Use: No  . Drug Use: Yes    Special: Marijuana  . Sexually Active: Not on file   Other Topics Concern  . Not on file   Social History Narrative  . No narrative on file    Family History  Problem Relation Age of Onset  . Heart disease Mother     Allergies as of 01/20/2012  . (No Known Allergies)    Current Facility-Administered Medications on File Prior to Encounter  Medication Dose Route Frequency Provider Last Rate Last Dose  . DISCONTD: HYDROmorphone (DILAUDID) injection 0.25-0.5 mg  0.25-0.5 mg Intravenous Q5 min PRN Napoleon Form, MD      . DISCONTD: ondansetron [facility]) injection 4 mg  4  mg Intravenous Once PRN Napoleon Form, MD      . DISCONTD: oxyCODONE-acetaminophen (PERCOCET) 5-325 MG per tablet 1-2 tablet  1-2 tablet Oral Q4H PRN Elam Dutch, MD   2 tablet at 01/19/12 1400   Current Outpatient Prescriptions on File Prior to Encounter  Medication Sig Dispense Refill  . b complex-vitamin c-folic acid (NEPHRO-VITE) 0.8 MG TABS Take 0.8 mg by mouth at bedtime.        . Calcium Carbonate-Simethicone (TUMS PLUS PO) Take 1-2 tablets by mouth daily as needed. For acid reflux.      . insulin aspart protamine-insulin aspart (NOVOLOG 70/30) (70-30) 100 UNIT/ML injection Inject 40-50 Units into the skin 2 (two) times daily with a meal. Take 40 units in the morning and 50 units in the evening      . oxyCODONE-acetaminophen (PERCOCET) 7.5-325 MG per tablet Take 1 tablet by mouth every 4 (four) hours as needed for pain.  15 tablet  0     REVIEW OF SYSTEMS: No CP or SOB  PHYSICAL EXAMINATION:   Vital signs are BP 126/78  Pulse 90  Temp 97.7 F (36.5 C) (Oral)  Resp 20  Ht 6\' 1"  (1.854 m)  Wt 250 lb (113.399 kg)  BMI 32.98 kg/m2  SpO2 100% General: The patient appears their stated age. HEENT:  No gross abnormalities Pulmonary:  Non labored breathing Musculoskeletal: There are no major deformities. Neurologic: No focal weakness or paresthesias are detected, Skin: There are no ulcer or rashes noted. Psychiatric: The patient has normal affect. Cardiovascular: There is a regular rate and rhythm without significant murmur appreciated.   Diagnostic Studies None  Assessment: ESRD Plan: Diatek catheter placement.  Discussed with patient that this may need to be paced in the groin if no other options exist  V. Leia Alf, M.D. [facility] [place] Office: [phone] Pager:  [phone]

## 2012-01-20 NOTE — Progress Notes (Signed)
Notified Dr. Tamala Julian at 12:08 of ISTAT Glucose 285 order received. Attempting to start IV since that time- C. Michaels RN attempted 20G in right arm. Cassandria Santee RN attempted times 1  In right wrist area. Cassandria Santee RN still trying to start IV. Attempting to call Dr. Tamala Julian back and phone is busy, paged him approximately 10 minutes ago.

## 2012-01-20 NOTE — Anesthesia Preprocedure Evaluation (Addendum)
Anesthesia Evaluation  Patient identified by MRN, date of birth, ID band Patient awake    Reviewed: Allergy & Precautions, H&P , NPO status , Patient's Chart, lab work & pertinent test results  History of Anesthesia Complications (+) AWARENESS UNDER ANESTHESIA  Airway Mallampati: II TM Distance: >3 FB Neck ROM: Full    Dental  (+) Teeth Intact and Dental Advisory Given   Pulmonary neg pulmonary ROS,  breath sounds clear to auscultation        Cardiovascular hypertension, +CHF Rhythm:Regular     Neuro/Psych negative neurological ROS     GI/Hepatic hiatal hernia, Indigestion with certain meals   Endo/Other  Well Controlled, Type 1, Insulin DependentGlucose 248  Renal/GU      Musculoskeletal negative musculoskeletal ROS (+)   Abdominal (+) + obese,   Peds  Hematology negative hematology ROS (+)   Anesthesia Other Findings   Reproductive/Obstetrics negative OB ROS                          Anesthesia Physical Anesthesia Plan  ASA: III  Anesthesia Plan: General   Post-op Pain Management:    Induction: Intravenous  Airway Management Planned: LMA  Additional Equipment:   Intra-op Plan:   Post-operative Plan: Extubation in OR  Informed Consent: I have reviewed the patients History and Physical, chart, labs and discussed the procedure including the risks, benefits and alternatives for the proposed anesthesia with the patient or authorized representative who has indicated his/her understanding and acceptance.   Dental advisory given  Plan Discussed with: CRNA, Anesthesiologist and Surgeon  Anesthesia Plan Comments:      Anesthesia Quick Evaluation

## 2012-01-21 ENCOUNTER — Encounter (HOSPITAL_COMMUNITY): Payer: Self-pay | Admitting: Surgery

## 2012-01-21 NOTE — Addendum Note (Signed)
Addendum  created 01/21/12 2053 by Delma Freeze, MD   Modules edited:Anesthesia Responsible Staff

## 2012-01-22 ENCOUNTER — Other Ambulatory Visit: Payer: Self-pay

## 2012-01-22 DIAGNOSIS — N186 End stage renal disease: Secondary | ICD-10-CM

## 2012-01-22 DIAGNOSIS — Z01810 Encounter for preprocedural cardiovascular examination: Secondary | ICD-10-CM

## 2012-01-28 ENCOUNTER — Encounter (HOSPITAL_COMMUNITY): Payer: Self-pay | Admitting: Pharmacy Technician

## 2012-01-28 ENCOUNTER — Other Ambulatory Visit: Payer: Self-pay | Admitting: *Deleted

## 2012-01-29 ENCOUNTER — Ambulatory Visit: Payer: Medicare Other | Admitting: Vascular Surgery

## 2012-02-05 MED ORDER — SODIUM CHLORIDE 0.9 % IJ SOLN
3.0000 mL | INTRAMUSCULAR | Status: DC | PRN
Start: 2012-02-05 — End: 2012-02-08

## 2012-02-06 ENCOUNTER — Ambulatory Visit (HOSPITAL_COMMUNITY)
Admission: RE | Admit: 2012-02-06 | Discharge: 2012-02-06 | Disposition: A | Discharge status: Home / Self Care | Payer: Medicare Other | Source: Ambulatory Visit | Attending: Vascular Surgery | Admitting: Vascular Surgery

## 2012-02-06 ENCOUNTER — Encounter (HOSPITAL_COMMUNITY)
Admission: RE | Disposition: A | Discharge status: Home / Self Care | Payer: Self-pay | Source: Ambulatory Visit | Attending: Vascular Surgery

## 2012-02-06 ENCOUNTER — Ambulatory Visit: Payer: Medicare Other | Admitting: Vascular Surgery

## 2012-02-06 DIAGNOSIS — E119 Type 2 diabetes mellitus without complications: Secondary | ICD-10-CM | POA: Insufficient documentation

## 2012-02-06 DIAGNOSIS — Z992 Dependence on renal dialysis: Secondary | ICD-10-CM | POA: Insufficient documentation

## 2012-02-06 DIAGNOSIS — N186 End stage renal disease: Secondary | ICD-10-CM | POA: Insufficient documentation

## 2012-02-06 DIAGNOSIS — I120 Hypertensive chronic kidney disease with stage 5 chronic kidney disease or end stage renal disease: Secondary | ICD-10-CM | POA: Insufficient documentation

## 2012-02-06 LAB — POCT I-STAT, CHEM 8
BUN: 57 mg/dL — ABNORMAL HIGH (ref 6–23)
Calcium, Ion: 0.93 mmol/L — ABNORMAL LOW (ref 1.12–1.23)
Creatinine, Ser: 10.6 mg/dL — ABNORMAL HIGH (ref 0.50–1.35)
Glucose, Bld: 154 mg/dL — ABNORMAL HIGH (ref 70–99)
TCO2: 24 mmol/L (ref 0–100)

## 2012-02-06 SURGERY — VENOGRAM
Laterality: Bilateral

## 2012-02-06 NOTE — H&P (Signed)
[facility] [place]  Brief History and Physical  History of Present Illness  [name] is a 46 y.o. male who presents with chief complaint: ESRD.  The patient presents today for BUE venogram.    Past Medical History  Diagnosis Date  . Diabetes mellitus   . Hypertension   . Chest pain   . Morbid obesity   . CHF (congestive heart failure)     hx  . Bronchitis   . Blood transfusion   . H/O hiatal hernia   . Chronic kidney disease     d[facility], [facility]    Past Surgical History  Procedure Date  . Av fistula placement 10/11/2010  . Ankle fusion     2-3 years ago  . Av fistula placement 07/02/2011    Procedure: INSERTION OF ARTERIOVENOUS (AV) GORE-TEX GRAFT ARM;  Surgeon: Elam Dutch, MD;  Location: [place];  Service: Vascular;  Laterality: Left;  . Thrombectomy   . Insertion of dialysis catheter 01/20/2012    Procedure: INSERTION OF DIALYSIS CATHETER;  Surgeon: Serafina Mitchell, MD;  Location: [place];  Service: Vascular;  Laterality: N/A;  insertion left internal jugular dialysis catheter 23cm    History   Social History  . Marital Status: Legally Separated    Spouse Name: N/A    Number of Children: N/A  . Years of Education: N/A   Occupational History  . Not on file.   Social History Main Topics  . Smoking status: Former Smoker -- 0.5 packs/day for 20 years    Types: Cigarettes  . Smokeless tobacco: Never Used  . Alcohol Use: No  . Drug Use: Yes    Special: Marijuana  . Sexually Active: Not on file   Other Topics Concern  . Not on file   Social History Narrative  . No narrative on file    Family History  Problem Relation Age of Onset  . Heart disease Mother     No current facility-administered medications on file prior to encounter.   Current Outpatient Prescriptions on File Prior to Encounter  Medication Sig Dispense Refill  . b complex-vitamin c-folic acid (NEPHRO-VITE) 0.8 MG TABS Take 0.8 mg by mouth at bedtime.         . Calcium Carbonate-Simethicone (TUMS PLUS PO) Take 1-2 tablets by mouth daily as needed. For acid reflux.      . insulin aspart protamine-insulin aspart (NOVOLOG 70/30) (70-30) 100 UNIT/ML injection Inject 40-50 Units into the skin 2 (two) times daily with a meal. Take 40 units in the morning and 50 units in the evening        No Known Allergies  Review of Systems: As listed above, otherwise negative.  Physical Examination  Filed Vitals:   02/06/12 0625  BP: 110/64  Pulse: 103  Temp: 97.6 F (36.4 C)  TempSrc: Oral  Resp: 16  Height: 6\' 1"  (1.854 m)  Weight: 260 lb (117.935 kg)  SpO2: 99%    General: A&O x 3, WDWN  Pulmonary: Sym exp, good air movt, CTAB, no rales, rhonchi, & wheezing  Cardiac: RRR, Nl S1, S2, no Murmurs, rubs or gallops  Gastrointestinal: soft, NTND, -G/R, - HSM, - masses, - CVAT B  Musculoskeletal: M/S 5/5 throughout , Extremities without ischemic changes , no thrill or bruit in either arm  Laboratory See [place]  [name] is a 46 y.o. male who presents with: end stage renal disease .  The patient is scheduled for: BUE arm and central venograms I discussed with the patient the nature of angiographic procedures, especially the limited patencies of any endovascular intervention.  The patient is aware of that the risks of an angiographic procedure include but are not limited to: bleeding, infection, access site complications, renal failure, embolization, rupture of vessel, dissection, possible need for emergent surgical intervention, possible need for surgical procedures to treat the patient's pathology, and stroke and death.    The patient is aware of the risks and agrees to proceed.  Adele Barthel, MD [facility] [place] Office: [phone] Pager: [phone]  02/06/2012, 7:43 AM

## 2012-02-06 NOTE — Op Note (Signed)
OPERATIVE NOTE   PROCEDURE: 1. Bilateral arm and central venogram   PRE-OPERATIVE DIAGNOSIS: end stage renal disease  POST-OPERATIVE DIAGNOSIS: same as above   SURGEON: Adele Barthel, MD  ANESTHESIA: local  ESTIMATED BLOOD LOSS: 5 cc  FINDING(S): 1. Patent right axillary vein: 1 cm in diameter 2. Faintly visualized central venous structures but appears to be patent with drainage via superior vena cava  3. Patent left axillary vein: 8 mm in diameter 4. Faintly visualized central venous structures but appears to be patent with drainage via superior vena cava  5. Left internal jugular vein tunneled dialysis catheter   SPECIMEN(S):  None  CONTRAST: 50 cc  INDICATIONS: [name] is a 46 y.o. male who  presents with end stage renal disease.  The patient is scheduled for bilateral venogram to help determine the availability of proximal veins for permanent access placement.  The patient is aware the risks include but are not limited to: bleeding, infection, thrombosis of the cannulated access, and possible anaphylactic reaction to the contrast.  The patient is aware of the risks of the procedure and elects to proceed forward.  DESCRIPTION: After full informed written consent was obtained, the patient was brought back to the angiography suite and placed supine upon the angiography table.  The patient was connected to monitoring equipment.  The right antecubital IV was connected to IV extension tubing.  Hand injections were completed to image the arm veins and central venous structures, the findings of which are listed above.  The left wrist IV was connected to IV extension tubing.  Hand injections were completed to image the arm veins and central venous structures, the findings of which are listed above.  Based on the imaging, I suspect the patient is a candidate for both a Gore Hybrid AVG and HeRO graft-catheter.  I would proceed with L Gore Hybrid AVG first.  COMPLICATIONS:  none  CONDITION: stable  Adele Barthel, MD [facility] [place] Office: [phone] Pager: [phone]  02/06/2012 9:54 AM

## 2012-02-12 ENCOUNTER — Encounter: Payer: Self-pay | Admitting: *Deleted

## 2012-02-12 ENCOUNTER — Encounter (HOSPITAL_COMMUNITY): Payer: Self-pay | Admitting: *Deleted

## 2012-02-12 ENCOUNTER — Other Ambulatory Visit: Payer: Self-pay | Admitting: *Deleted

## 2012-02-12 MED ORDER — DEXTROSE 5 % IV SOLN: 1.5000 g | INTRAVENOUS | Status: DC

## 2012-02-12 MED ORDER — SODIUM CHLORIDE 0.9 % IV SOLN: INTRAVENOUS | Status: DC

## 2012-02-12 NOTE — Progress Notes (Signed)
Pt stated that the Dr's office instructed him to arrive at 1000.

## 2012-02-12 NOTE — Progress Notes (Signed)
Pt states yhat he had a 2DEcho and stress at [facility] [place].  Pt unsure of who cardiologist is.

## 2012-02-13 ENCOUNTER — Encounter (HOSPITAL_COMMUNITY): Payer: Self-pay | Admitting: Anesthesiology

## 2012-02-13 ENCOUNTER — Ambulatory Visit (HOSPITAL_COMMUNITY): Payer: Medicare Other

## 2012-02-13 ENCOUNTER — Encounter (HOSPITAL_COMMUNITY)
Admission: RE | Disposition: A | Discharge status: Home / Self Care | Payer: Self-pay | Source: Ambulatory Visit | Attending: Surgery

## 2012-02-13 ENCOUNTER — Other Ambulatory Visit: Payer: Self-pay | Admitting: *Deleted

## 2012-02-13 ENCOUNTER — Encounter (HOSPITAL_COMMUNITY): Payer: Self-pay | Admitting: *Deleted

## 2012-02-13 ENCOUNTER — Ambulatory Visit (HOSPITAL_COMMUNITY): Payer: Medicare Other | Admitting: Anesthesiology

## 2012-02-13 ENCOUNTER — Ambulatory Visit (HOSPITAL_COMMUNITY)
Admission: RE | Admit: 2012-02-13 | Discharge: 2012-02-13 | Disposition: A | Discharge status: Home / Self Care | Payer: Medicare Other | Source: Ambulatory Visit | Attending: Surgery | Admitting: Surgery

## 2012-02-13 DIAGNOSIS — I120 Hypertensive chronic kidney disease with stage 5 chronic kidney disease or end stage renal disease: Secondary | ICD-10-CM | POA: Insufficient documentation

## 2012-02-13 DIAGNOSIS — E119 Type 2 diabetes mellitus without complications: Secondary | ICD-10-CM | POA: Insufficient documentation

## 2012-02-13 DIAGNOSIS — T82898A Other specified complication of vascular prosthetic devices, implants and grafts, initial encounter: Secondary | ICD-10-CM | POA: Insufficient documentation

## 2012-02-13 DIAGNOSIS — N186 End stage renal disease: Secondary | ICD-10-CM | POA: Insufficient documentation

## 2012-02-13 DIAGNOSIS — Y832 Surgical operation with anastomosis, bypass or graft as the cause of abnormal reaction of the patient, or of later complication, without mention of misadventure at the time of the procedure: Secondary | ICD-10-CM | POA: Insufficient documentation

## 2012-02-13 DIAGNOSIS — Z992 Dependence on renal dialysis: Secondary | ICD-10-CM | POA: Insufficient documentation

## 2012-02-13 DIAGNOSIS — K219 Gastro-esophageal reflux disease without esophagitis: Secondary | ICD-10-CM | POA: Insufficient documentation

## 2012-02-13 HISTORY — DX: Gastro-esophageal reflux disease without esophagitis: K21.9

## 2012-02-13 HISTORY — DX: Cardiac arrhythmia, unspecified: I49.9

## 2012-02-13 LAB — GLUCOSE, CAPILLARY: Glucose-Capillary: 134 mg/dL — ABNORMAL HIGH (ref 70–99)

## 2012-02-13 LAB — APTT: aPTT: 22 seconds — ABNORMAL LOW (ref 24–37)

## 2012-02-13 LAB — POCT I-STAT 4, (NA,K, GLUC, HGB,HCT)
Glucose, Bld: 150 mg/dL — ABNORMAL HIGH (ref 70–99)
HCT: 31 % — ABNORMAL LOW (ref 39.0–52.0)
Sodium: 136 mEq/L (ref 135–145)

## 2012-02-13 SURGERY — EXCHANGE OF A DIALYSIS CATHETER
Anesthesia: Monitor Anesthesia Care | Wound class: Clean

## 2012-02-13 SURGERY — EXCHANGE OF A DIALYSIS CATHETER
Anesthesia: General

## 2012-02-13 MED ORDER — LIDOCAINE-EPINEPHRINE (PF) 1 %-1:200000 IJ SOLN
INTRAMUSCULAR | Status: AC
  Filled 2012-02-13: qty 10

## 2012-02-13 MED ORDER — SODIUM CHLORIDE 0.9 % IR SOLN
Status: DC | PRN
  Administered 2012-02-13: 14:00:00

## 2012-02-13 MED ORDER — LIDOCAINE-EPINEPHRINE (PF) 1 %-1:200000 IJ SOLN
INTRAMUSCULAR | Status: DC | PRN
  Administered 2012-02-13: 15 mL

## 2012-02-13 MED ORDER — HEPARIN SODIUM (PORCINE) 1000 UNIT/ML IJ SOLN
INTRAMUSCULAR | Status: AC
  Filled 2012-02-13: qty 1

## 2012-02-13 MED ORDER — SODIUM CHLORIDE 0.9 % IV SOLN
INTRAVENOUS | Status: DC | PRN
  Administered 2012-02-13: 15:00:00 via INTRAVENOUS

## 2012-02-13 MED ORDER — SODIUM POLYSTYRENE SULFONATE 15 GM/60ML PO SUSP
30.0000 g | Freq: Once | ORAL | Status: DC
  Filled 2012-02-13: qty 120

## 2012-02-13 MED ORDER — MUPIROCIN 2 % EX OINT: TOPICAL_OINTMENT | Freq: Two times a day (BID) | CUTANEOUS | Status: DC

## 2012-02-13 MED ORDER — CEFAZOLIN SODIUM 1-5 GM-% IV SOLN
INTRAVENOUS | Status: AC
  Filled 2012-02-13: qty 50

## 2012-02-13 MED ORDER — SODIUM CHLORIDE 0.9 % IR SOLN
Status: DC | PRN
  Administered 2012-02-13: 1000 mL

## 2012-02-13 MED ORDER — CEFAZOLIN SODIUM 1-5 GM-% IV SOLN
INTRAVENOUS | Status: DC | PRN
  Administered 2012-02-13: 1 g via INTRAVENOUS

## 2012-02-13 MED ORDER — IOHEXOL 300 MG/ML  SOLN
INTRAMUSCULAR | Status: DC | PRN
  Administered 2012-02-13: 23 mL via INTRAVENOUS

## 2012-02-13 SURGICAL SUPPLY — 53 items
ADH SKN CLS APL DERMABOND .7 (GAUZE/BANDAGES/DRESSINGS) ×1
BAG DECANTER FOR FLEXI CONT (MISCELLANEOUS) ×2 IMPLANT
CATH BEACON 5.038 65CM KMP-01 (CATHETERS) ×1 IMPLANT
CATH CANNON HEMO 15F 50CM (CATHETERS) IMPLANT
CATH CANNON HEMO 15FR 19 (HEMODIALYSIS SUPPLIES) IMPLANT
CATH CANNON HEMO 15FR 23CM (HEMODIALYSIS SUPPLIES) IMPLANT
CATH CANNON HEMO 15FR 31CM (HEMODIALYSIS SUPPLIES) IMPLANT
CATH CANNON HEMO 15FR 32 (HEMODIALYSIS SUPPLIES) IMPLANT
CATH CANNON HEMO 15FR 32CM (HEMODIALYSIS SUPPLIES) ×2 IMPLANT
CLOTH BEACON ORANGE TIMEOUT ST (SAFETY) ×2 IMPLANT
COVER PROBE W GEL 5X96 (DRAPES) ×2 IMPLANT
COVER SURGICAL LIGHT HANDLE (MISCELLANEOUS) ×2 IMPLANT
DERMABOND ADVANCED (GAUZE/BANDAGES/DRESSINGS) ×1
DERMABOND ADVANCED .7 DNX12 (GAUZE/BANDAGES/DRESSINGS) ×1 IMPLANT
DRAPE C-ARM 42X72 X-RAY (DRAPES) ×2 IMPLANT
DRAPE CHEST BREAST 15X10 FENES (DRAPES) ×2 IMPLANT
GAUZE SPONGE 2X2 8PLY STRL LF (GAUZE/BANDAGES/DRESSINGS) IMPLANT
GAUZE SPONGE 4X4 16PLY XRAY LF (GAUZE/BANDAGES/DRESSINGS) ×2 IMPLANT
GLOVE BIOGEL PI IND STRL 6.5 (GLOVE) IMPLANT
GLOVE BIOGEL PI IND STRL 7.0 (GLOVE) IMPLANT
GLOVE BIOGEL PI IND STRL 7.5 (GLOVE) ×1 IMPLANT
GLOVE BIOGEL PI INDICATOR 6.5 (GLOVE) ×2
GLOVE BIOGEL PI INDICATOR 7.0 (GLOVE) ×1
GLOVE BIOGEL PI INDICATOR 7.5 (GLOVE) ×1
GLOVE ECLIPSE 7.0 STRL STRAW (GLOVE) ×1 IMPLANT
GLOVE SURG SS PI 7.5 STRL IVOR (GLOVE) ×2 IMPLANT
GOWN PREVENTION PLUS XLARGE (GOWN DISPOSABLE) ×1 IMPLANT
GOWN PREVENTION PLUS XXLARGE (GOWN DISPOSABLE) ×2 IMPLANT
GOWN STRL NON-REIN LRG LVL3 (GOWN DISPOSABLE) ×2 IMPLANT
GUIDEWIRE AMPLATZ STIFF 0.35 (WIRE) ×2 IMPLANT
KIT BASIN OR (CUSTOM PROCEDURE TRAY) ×2 IMPLANT
KIT ROOM TURNOVER OR (KITS) ×2 IMPLANT
NDL 18GX1X1/2 (RX/OR ONLY) (NEEDLE) ×1 IMPLANT
NDL HYPO 25GX1X1/2 BEV (NEEDLE) ×1 IMPLANT
NEEDLE 18GX1X1/2 (RX/OR ONLY) (NEEDLE) ×2 IMPLANT
NEEDLE HYPO 25GX1X1/2 BEV (NEEDLE) ×2 IMPLANT
NS IRRIG 1000ML POUR BTL (IV SOLUTION) ×2 IMPLANT
PACK SURGICAL SETUP 50X90 (CUSTOM PROCEDURE TRAY) ×2 IMPLANT
PAD ARMBOARD 7.5X6 YLW CONV (MISCELLANEOUS) ×4 IMPLANT
SOAP 2 % CHG 4 OZ (WOUND CARE) ×2 IMPLANT
SPONGE GAUZE 2X2 STER 10/PKG (GAUZE/BANDAGES/DRESSINGS) ×2
SUT ETHILON 3 0 PS 1 (SUTURE) ×2 IMPLANT
SUT VICRYL 4-0 PS2 18IN ABS (SUTURE) ×2 IMPLANT
SYR 20CC LL (SYRINGE) ×5 IMPLANT
SYR 30ML LL (SYRINGE) IMPLANT
SYR 5ML LL (SYRINGE) ×2 IMPLANT
SYR CONTROL 10ML LL (SYRINGE) ×2 IMPLANT
SYRINGE 10CC LL (SYRINGE) ×2 IMPLANT
TAPE CLOTH SURG 4X10 WHT LF (GAUZE/BANDAGES/DRESSINGS) ×1 IMPLANT
TOWEL OR 17X24 6PK STRL BLUE (TOWEL DISPOSABLE) ×2 IMPLANT
TOWEL OR 17X26 10 PK STRL BLUE (TOWEL DISPOSABLE) ×2 IMPLANT
WATER STERILE IRR 1000ML POUR (IV SOLUTION) ×2 IMPLANT
WIRE BENTSON .035X145CM (WIRE) ×1 IMPLANT

## 2012-02-13 NOTE — Op Note (Signed)
[facility] of [place]  Patient name: [name] MRN: [id number] DOB: [birth year] Sex: male  02/13/2012 Pre-operative Diagnosis: Poorly functioning dialysis catheter Post-operative diagnosis:  Same Surgeon:  Eldridge Abrahams Assistants:  None Procedure:   #1: Removal left sided dialysis catheter   #2: Placement of left internal jugular vein dialysis catheter   #3: Catheter in inferior vena cava (first-order)   #4: central venogram Anesthesia:  Local Blood Loss:  See anesthesia record Specimens:  None   Findings:  The patient has a dilated innominate vein. I had difficulty advancing the catheter around the band. I was unsatisfied with this initial location and therefore I tried to reposition it. Upon repositioning I had wire and catheter go into the azygous system. Ultimately however I was able to get the catheter into good position.  Indications:  The patient has occluded port on his dialysis catheter which could not be declotted. He comes in today for exchange.  Procedure:  The patient was identified in the holding area and taken to [place] [facility]  The patient was then placed supine on the table. local anesthesia was administered.  The patient was prepped and draped in the usual sterile fashion.  A time out was called and antibiotics were administered.  One percent lidocaine was used for local anesthesia. I initially mobilized the cuff from the existing catheter until it was free. I made a incision in the neck and exposed the catheter layer. The catheter was transected and the cuff portion brought out through the neck incision. I then placed a J-wire through the catheter tip and the catheter was removed. This was done with the aid of fluoroscopy. I then placed a peel-away sheath. A 27 cm hemodialysis catheter was advanced through the peel-away sheath. Because of the tortuosity within the innominate vein I was not satisfied with the final location of the catheter. I  tried to advance a Amplatz superstiff wire through the catheter but could not get it to traverse the end of the catheter. I then used a KM P. catheter and a Benson wire to try to get access back into the inferior vena cava. I kept on going into the azygous venous system as was confirmed by central venography with the catheter in the azygous vein. Ultimately I was able to navigate the catheter wire back into the right atrium. A Amplatz superstiff wire was then placed. I then inserted a 27 cm dialysis catheter with the tip in the right atrium. Because of the redundancy within the innominate vein the catheter was very difficult to advance it was functioning good at this level and I therefore left it in that position. A additional a skin incision was made followed by subcutaneous tunnel. The catheter was brought through the tunnel and the cuff was situated at the skin exit site. The catheter was then connected to the port portion and secured. Both ports flushed and aspirated without difficulty. The catheter was sewn into position with a 3-0 nylon. The skin incision the neck was closed with a 4-0 Vicryl. The appropriate volumes of heparin was instilled into each port. The patient tolerated the procedure well there were no complications.   Disposition:  To PACU in stable condition.   Theotis Burrow, M.D. [facility] [place] Office: [phone] Pager:  [phone]

## 2012-02-13 NOTE — Preoperative (Signed)
Beta Blockers   Reason not to administer Beta Blockers:Not Applicable 

## 2012-02-13 NOTE — Anesthesia Preprocedure Evaluation (Addendum)
Anesthesia Evaluation  Patient identified by MRN, date of birth, ID band Patient awake    Reviewed: Allergy & Precautions, H&P , NPO status , Patient's Chart, lab work & pertinent test results  History of Anesthesia Complications Negative for: history of anesthetic complications  Airway Mallampati: II TM Distance: >3 FB Neck ROM: Full    Dental  (+) Teeth Intact and Dental Advisory Given   Pulmonary former smoker,          Cardiovascular hypertension, Pt. on medications +CHF + dysrhythmias     Neuro/Psych negative neurological ROS  negative psych ROS   GI/Hepatic hiatal hernia, GERD-  Controlled,  Endo/Other  Type 2, Insulin DependentHyperthyroidism Morbid obesity  Renal/GU Dialysis and ESRFRenal disease (M/W/F)     Musculoskeletal negative musculoskeletal ROS (+)   Abdominal (+) + obese,   Peds  Hematology  (+) Blood dyscrasia, anemia ,   Anesthesia Other Findings   Reproductive/Obstetrics negative OB ROS                         Anesthesia Physical Anesthesia Plan  ASA: IV  Anesthesia Plan: MAC   Post-op Pain Management:    Induction:   Airway Management Planned: Simple Face Mask and Natural Airway  Additional Equipment:   Intra-op Plan:   Post-operative Plan:   Informed Consent:   Dental advisory given  Plan Discussed with: CRNA and Anesthesiologist  Anesthesia Plan Comments:         Anesthesia Quick Evaluation

## 2012-02-13 NOTE — Anesthesia Postprocedure Evaluation (Signed)
Anesthesia Post Note  Patient: [name]  Procedure(s) Performed: Procedure(s) (LRB): EXCHANGE OF A DIALYSIS CATHETER (N/A)  Anesthesia type: general  Patient location: PACU  Post pain: Pain level controlled  Post assessment: Patient's Cardiovascular Status Stable  Last Vitals:  Filed Vitals:   02/13/12 1145  BP: 133/74  Pulse: 91  Temp: 36.8 C  Resp: 18    Post vital signs: Reviewed and stable  Level of consciousness: sedated  Complications: No apparent anesthesia complications

## 2012-02-13 NOTE — Transfer of Care (Signed)
Immediate Anesthesia Transfer of Care Note  Patient: [name]  Procedure(s) Performed: Procedure(s) (LRB): EXCHANGE OF A DIALYSIS CATHETER (N/A)  Patient Location: PACU  Anesthesia Type: MAC  Level of Consciousness: awake, alert , oriented and patient cooperative  Airway & Oxygen Therapy: Patient Spontanous Breathing  Post-op Assessment: Report given to PACU RN and Post -op Vital signs reviewed and stable  Post vital signs: Reviewed and stable  Complications: No apparent anesthesia complications

## 2012-02-13 NOTE — H&P (Signed)
Vascular and Vein Specialist of [place]   Patient name: [name] MRN: [id number] DOB: Dec 21, 1965 Sex: male    No chief complaint on file.   HISTORY OF PRESENT ILLNESS: The patient presents with a poorly functioning HD catheter.  He is scheduled for a new [place] next week  Past Medical History  Diagnosis Date  . Diabetes mellitus   . Hypertension   . Chest pain   . Morbid obesity   . Bronchitis   . Blood transfusion   . H/O hiatal hernia   . Chronic kidney disease     d[facility], [facility]  . Dysrhythmia   . GERD (gastroesophageal reflux disease)   . Hyperthyroidism     Past Surgical History  Procedure Date  . Av fistula placement 10/11/2010  . Ankle fusion     2-3 years ago  . Av fistula placement 07/02/2011    Procedure: INSERTION OF ARTERIOVENOUS (AV) GORE-TEX GRAFT ARM;  Surgeon: Elam Dutch, MD;  Location: [facility];  Service: Vascular;  Laterality: Left;  . Thrombectomy   . Insertion of dialysis catheter 01/20/2012    Procedure: INSERTION OF DIALYSIS CATHETER;  Surgeon: Serafina Mitchell, MD;  Location: [place];  Service: Vascular;  Laterality: N/A;  insertion left internal jugular dialysis catheter 23cm  . Umbilcial hernia     History   Social History  . Marital Status: Legally Separated    Spouse Name: N/A    Number of Children: N/A  . Years of Education: N/A   Occupational History  . Not on file.   Social History Main Topics  . Smoking status: Current Some Day Smoker -- 0.1 packs/day for 20 years    Types: Cigarettes  . Smokeless tobacco: Never Used  . Alcohol Use: No  . Drug Use: No  . Sexually Active: Not on file   Other Topics Concern  . Not on file   Social History Narrative  . No narrative on file    Family History  Problem Relation Age of Onset  . Heart disease Mother     Allergies as of 02/12/2012  . (No Known Allergies)    No current facility-administered medications on file prior to encounter.   Current Outpatient  Prescriptions on File Prior to Encounter  Medication Sig Dispense Refill  . b complex-vitamin c-folic acid (NEPHRO-VITE) 0.8 MG TABS Take 0.8 mg by mouth at bedtime.        . Calcium Carbonate-Simethicone (TUMS PLUS PO) Take 1-2 tablets by mouth daily as needed. For acid reflux.      . insulin aspart protamine-insulin aspart (NOVOLOG 70/30) (70-30) 100 UNIT/ML injection Inject 40-50 Units into the skin 2 (two) times daily with a meal. Take 40 units in the morning and 50 units in the evening         REVIEW OF SYSTEMS: Cardiovascular: No chest pain, chest pressure Pulmonary: No productive cough, asthma or wheezing. Constitutional: No fever or chills.  PHYSICAL EXAMINATION:   Vital signs are BP 133/74  Pulse 91  Temp 98.3 F (36.8 C) (Oral)  Resp 18  Ht 6\' 1"  (1.854 m)  Wt 265 lb 14 oz (120.6 kg)  BMI 35.08 kg/m2  SpO2 97% General: The patient appears their stated age. HEENT:  No gross abnormalities Pulmonary:  Non labored breathing Musculoskeletal: There are no major deformities. Neurologic: No focal weakness or paresthesias are detected, Skin: There are no ulcer or rashes noted. Psychiatric: The patient has normal affect. Cardiovascular: There is a  regular rate and rhythm      Assessment: ESRD Plan: Exchange of dialysis catheter, possible new catheter  V. Leia Alf, M.D. [facility] [place] Office: [phone] Pager:  [phone]

## 2012-02-21 ENCOUNTER — Encounter (HOSPITAL_COMMUNITY): Payer: Self-pay | Admitting: *Deleted

## 2012-02-23 MED ORDER — SODIUM CHLORIDE 0.9 % IV SOLN: INTRAVENOUS | Status: DC

## 2012-02-23 MED ORDER — DEXTROSE 5 % IV SOLN
1.5000 g | INTRAVENOUS | Status: AC
  Administered 2012-02-24: 1.5 g via INTRAVENOUS
  Filled 2012-02-23 (×3): qty 1.5

## 2012-02-24 ENCOUNTER — Encounter (HOSPITAL_COMMUNITY)
Admission: RE | Disposition: A | Discharge status: Home / Self Care | Payer: Self-pay | Source: Ambulatory Visit | Attending: Vascular Surgery

## 2012-02-24 ENCOUNTER — Ambulatory Visit (HOSPITAL_COMMUNITY): Payer: Medicare Other | Admitting: Anesthesiology

## 2012-02-24 ENCOUNTER — Ambulatory Visit (HOSPITAL_COMMUNITY)
Admission: RE | Admit: 2012-02-24 | Discharge: 2012-02-24 | Disposition: A | Discharge status: Home / Self Care | Payer: Medicare Other | Source: Ambulatory Visit | Attending: Vascular Surgery | Admitting: Vascular Surgery

## 2012-02-24 ENCOUNTER — Encounter (HOSPITAL_COMMUNITY): Payer: Self-pay | Admitting: Anesthesiology

## 2012-02-24 ENCOUNTER — Telehealth: Payer: Self-pay | Admitting: Vascular Surgery

## 2012-02-24 ENCOUNTER — Ambulatory Visit (HOSPITAL_COMMUNITY): Payer: Medicare Other

## 2012-02-24 DIAGNOSIS — I120 Hypertensive chronic kidney disease with stage 5 chronic kidney disease or end stage renal disease: Secondary | ICD-10-CM | POA: Insufficient documentation

## 2012-02-24 DIAGNOSIS — Y832 Surgical operation with anastomosis, bypass or graft as the cause of abnormal reaction of the patient, or of later complication, without mention of misadventure at the time of the procedure: Secondary | ICD-10-CM | POA: Insufficient documentation

## 2012-02-24 DIAGNOSIS — Z992 Dependence on renal dialysis: Secondary | ICD-10-CM | POA: Insufficient documentation

## 2012-02-24 DIAGNOSIS — N186 End stage renal disease: Secondary | ICD-10-CM | POA: Insufficient documentation

## 2012-02-24 DIAGNOSIS — E119 Type 2 diabetes mellitus without complications: Secondary | ICD-10-CM | POA: Insufficient documentation

## 2012-02-24 DIAGNOSIS — T82898A Other specified complication of vascular prosthetic devices, implants and grafts, initial encounter: Secondary | ICD-10-CM | POA: Insufficient documentation

## 2012-02-24 HISTORY — DX: Hyperparathyroidism, unspecified: E21.3

## 2012-02-24 LAB — POCT I-STAT 4, (NA,K, GLUC, HGB,HCT)
Glucose, Bld: 138 mg/dL — ABNORMAL HIGH (ref 70–99)
Hemoglobin: 9.9 g/dL — ABNORMAL LOW (ref 13.0–17.0)
Potassium: 4.6 mEq/L (ref 3.5–5.1)
Sodium: 138 mEq/L (ref 135–145)

## 2012-02-24 SURGERY — INSERTION HYBRID ANTERIOVENOUS GORTEX GRAFT
Anesthesia: General | Site: Arm Upper | Laterality: Right | Wound class: Clean

## 2012-02-24 MED ORDER — GLYCOPYRROLATE 0.2 MG/ML IJ SOLN
INTRAMUSCULAR | Status: DC | PRN
  Administered 2012-02-24: .4 mg via INTRAVENOUS

## 2012-02-24 MED ORDER — MUPIROCIN 2 % EX OINT
TOPICAL_OINTMENT | Freq: Two times a day (BID) | CUTANEOUS | Status: DC
  Administered 2012-02-24: 1 via NASAL

## 2012-02-24 MED ORDER — ACETAMINOPHEN 10 MG/ML IV SOLN
INTRAVENOUS | Status: DC | PRN
  Administered 2012-02-24: 1000 mg via INTRAVENOUS

## 2012-02-24 MED ORDER — FENTANYL CITRATE 0.05 MG/ML IJ SOLN
INTRAMUSCULAR | Status: DC | PRN
  Administered 2012-02-24: 50 ug via INTRAVENOUS
  Administered 2012-02-24: 150 ug via INTRAVENOUS
  Administered 2012-02-24: 50 ug via INTRAVENOUS

## 2012-02-24 MED ORDER — MUPIROCIN 2 % EX OINT
TOPICAL_OINTMENT | CUTANEOUS | Status: AC
  Administered 2012-02-24: 1 via NASAL
  Filled 2012-02-24: qty 22

## 2012-02-24 MED ORDER — ACETAMINOPHEN 10 MG/ML IV SOLN
INTRAVENOUS | Status: AC
  Filled 2012-02-24: qty 100

## 2012-02-24 MED ORDER — DEXTROSE 5 % IV SOLN
INTRAVENOUS | Status: DC | PRN
  Administered 2012-02-24: 08:00:00 via INTRAVENOUS

## 2012-02-24 MED ORDER — ALTEPLASE 100 MG IV SOLR
INTRAVENOUS | Status: DC | PRN
  Administered 2012-02-24: 2 mg

## 2012-02-24 MED ORDER — IOHEXOL 350 MG/ML SOLN
INTRAVENOUS | Status: DC | PRN
  Administered 2012-02-24: 50 mL via INTRAVENOUS

## 2012-02-24 MED ORDER — LIDOCAINE-EPINEPHRINE (PF) 1 %-1:200000 IJ SOLN
INTRAMUSCULAR | Status: AC
  Filled 2012-02-24: qty 10

## 2012-02-24 MED ORDER — DEXTROSE 5 % IV SOLN
INTRAVENOUS | Status: AC
  Filled 2012-02-24: qty 50

## 2012-02-24 MED ORDER — BUPIVACAINE HCL (PF) 0.5 % IJ SOLN
INTRAMUSCULAR | Status: AC
  Filled 2012-02-24: qty 30

## 2012-02-24 MED ORDER — ONDANSETRON HCL 4 MG/2ML IJ SOLN
INTRAMUSCULAR | Status: DC | PRN
  Administered 2012-02-24: 4 mg via INTRAVENOUS

## 2012-02-24 MED ORDER — THROMBIN 20000 UNITS EX SOLR
CUTANEOUS | Status: DC | PRN
  Administered 2012-02-24: 20000 [IU] via TOPICAL

## 2012-02-24 MED ORDER — 0.9 % SODIUM CHLORIDE (POUR BTL) OPTIME
TOPICAL | Status: DC | PRN
  Administered 2012-02-24: 1000 mL

## 2012-02-24 MED ORDER — CEFUROXIME SODIUM 1.5 G IJ SOLR
INTRAMUSCULAR | Status: AC
  Filled 2012-02-24: qty 1.5

## 2012-02-24 MED ORDER — DEXAMETHASONE SODIUM PHOSPHATE 10 MG/ML IJ SOLN
INTRAMUSCULAR | Status: DC | PRN
  Administered 2012-02-24: 4 mg via INTRAVENOUS

## 2012-02-24 MED ORDER — MIDAZOLAM HCL 5 MG/5ML IJ SOLN
INTRAMUSCULAR | Status: DC | PRN
  Administered 2012-02-24: 2 mg via INTRAVENOUS

## 2012-02-24 MED ORDER — ALTEPLASE 2 MG IJ SOLR
2.0000 mg | INTRAMUSCULAR | Status: DC
  Filled 2012-02-24: qty 2

## 2012-02-24 MED ORDER — OXYCODONE HCL 5 MG PO TABS: 5.0000 mg | ORAL_TABLET | ORAL | Status: AC | PRN

## 2012-02-24 MED ORDER — SODIUM CHLORIDE 0.9 % IV SOLN
INTRAVENOUS | Status: DC | PRN
  Administered 2012-02-24 (×2): via INTRAVENOUS

## 2012-02-24 MED ORDER — THROMBIN 20000 UNITS EX SOLR
CUTANEOUS | Status: AC
  Filled 2012-02-24: qty 20000

## 2012-02-24 MED ORDER — HEPARIN SODIUM (PORCINE) 5000 UNIT/ML IJ SOLN
INTRAMUSCULAR | Status: DC | PRN
  Administered 2012-02-24: 08:00:00

## 2012-02-24 MED ORDER — ROCURONIUM BROMIDE 100 MG/10ML IV SOLN
INTRAVENOUS | Status: DC | PRN
  Administered 2012-02-24: 50 mg via INTRAVENOUS

## 2012-02-24 MED ORDER — PHENYLEPHRINE HCL 10 MG/ML IJ SOLN
20.0000 mg | INTRAVENOUS | Status: DC | PRN
  Administered 2012-02-24: 25 ug/min via INTRAVENOUS

## 2012-02-24 MED ORDER — HEPARIN SODIUM (PORCINE) 1000 UNIT/ML IJ SOLN
INTRAMUSCULAR | Status: DC | PRN
  Administered 2012-02-24: 5000 [IU] via INTRAVENOUS

## 2012-02-24 MED ORDER — NEOSTIGMINE METHYLSULFATE 1 MG/ML IJ SOLN
INTRAMUSCULAR | Status: DC | PRN
  Administered 2012-02-24: 3 mg via INTRAVENOUS

## 2012-02-24 MED ORDER — THROMBIN 20000 UNITS EX KIT
PACK | CUTANEOUS | Status: DC | PRN
  Administered 2012-02-24: 10:00:00 via TOPICAL

## 2012-02-24 MED ORDER — HYDROMORPHONE HCL PF 1 MG/ML IJ SOLN: 0.2500 mg | INTRAMUSCULAR | Status: DC | PRN

## 2012-02-24 MED ORDER — PROPOFOL 10 MG/ML IV EMUL
INTRAVENOUS | Status: DC | PRN
  Administered 2012-02-24: 140 mL via INTRAVENOUS

## 2012-02-24 MED ORDER — HEPARIN SODIUM (PORCINE) 1000 UNIT/ML IJ SOLN
1000.0000 [IU] | Freq: Once | INTRAMUSCULAR | Status: AC
  Administered 2012-02-24: 2400 [IU] via INTRAVENOUS

## 2012-02-24 MED ORDER — PHENYLEPHRINE HCL 10 MG/ML IJ SOLN
INTRAMUSCULAR | Status: DC | PRN
  Administered 2012-02-24: 30 ug via INTRAVENOUS
  Administered 2012-02-24: 150 ug via INTRAVENOUS

## 2012-02-24 MED ORDER — HEPARIN SODIUM (PORCINE) 1000 UNIT/ML IJ SOLN
1000.0000 [IU] | Freq: Once | INTRAMUSCULAR | Status: AC
  Administered 2012-02-24: 2600 [IU] via INTRAVENOUS

## 2012-02-24 SURGICAL SUPPLY — 60 items
ADH SKN CLS APL DERMABOND .7 (GAUZE/BANDAGES/DRESSINGS) ×1
BALLN POWERFLEX 8 (BALLOONS)
BALLN POWERPLUS 9 (MISCELLANEOUS) IMPLANT
BALLOON DORADO 8X40X80 (BALLOONS) ×1 IMPLANT
BALLOON POWERFLEX 8 (BALLOONS) IMPLANT
BLANKET WARM CARDIAC ADLT BAIR (MISCELLANEOUS) ×1 IMPLANT
CANISTER SUCTION 2500CC (MISCELLANEOUS) ×2 IMPLANT
CATH SOFT-VU 4F 65 STRAIGHT (CATHETERS) IMPLANT
CATH SOFT-VU STRAIGHT 4F 65CM (CATHETERS) ×2
CLIP TI MEDIUM 6 (CLIP) ×1 IMPLANT
CLOTH BEACON ORANGE TIMEOUT ST (SAFETY) ×2 IMPLANT
COVER PROBE W GEL 5X96 (DRAPES) ×1 IMPLANT
COVER SURGICAL LIGHT HANDLE (MISCELLANEOUS) ×2 IMPLANT
DERMABOND ADVANCED (GAUZE/BANDAGES/DRESSINGS) ×1
DERMABOND ADVANCED .7 DNX12 (GAUZE/BANDAGES/DRESSINGS) ×1 IMPLANT
DEVICE INFLATION ENCORE 26 (MISCELLANEOUS) ×1 IMPLANT
ELECT REM PT RETURN 9FT ADLT (ELECTROSURGICAL) ×2
ELECTRODE REM PT RTRN 9FT ADLT (ELECTROSURGICAL) ×1 IMPLANT
GEL ULTRASOUND 20GR AQUASONIC (MISCELLANEOUS) ×2 IMPLANT
GLOVE BIO SURGEON STRL SZ 6.5 (GLOVE) ×2 IMPLANT
GLOVE BIO SURGEON STRL SZ7 (GLOVE) ×2 IMPLANT
GLOVE BIOGEL PI IND STRL 7.0 (GLOVE) IMPLANT
GLOVE BIOGEL PI IND STRL 7.5 (GLOVE) ×1 IMPLANT
GLOVE BIOGEL PI INDICATOR 7.0 (GLOVE) ×2
GLOVE BIOGEL PI INDICATOR 7.5 (GLOVE) ×1
GLOVE SS BIOGEL STRL SZ 7 (GLOVE) IMPLANT
GLOVE SUPERSENSE BIOGEL SZ 7 (GLOVE)
GOWN PREVENTION PLUS XXLARGE (GOWN DISPOSABLE) ×1 IMPLANT
GOWN STRL NON-REIN LRG LVL3 (GOWN DISPOSABLE) ×4 IMPLANT
GOWN STRL REIN XL XLG (GOWN DISPOSABLE) ×1 IMPLANT
GRAFT VASCULAR HYBRID 6X50X9X5 (Vascular Products) ×1 IMPLANT
INSERT FOGARTY SM (MISCELLANEOUS) ×1 IMPLANT
INTRODUCER COOK 11FR (CATHETERS) IMPLANT
INTRODUCER SET COOK 14FR (MISCELLANEOUS) ×2 IMPLANT
KIT BASIN OR (CUSTOM PROCEDURE TRAY) ×2 IMPLANT
KIT ENCORE 26 ADVANTAGE (KITS) ×1 IMPLANT
KIT ROOM TURNOVER OR (KITS) ×2 IMPLANT
NDL PERC 18GX7CM (NEEDLE) ×1 IMPLANT
NEEDLE PERC 18GX7CM (NEEDLE) ×2 IMPLANT
NS IRRIG 1000ML POUR BTL (IV SOLUTION) ×2 IMPLANT
PACK CV ACCESS (CUSTOM PROCEDURE TRAY) ×2 IMPLANT
PAD ARMBOARD 7.5X6 YLW CONV (MISCELLANEOUS) ×4 IMPLANT
SET INTRODUCER 12FR PACEMAKER (SHEATH) IMPLANT
SET MICROPUNCTURE 5F STIFF (MISCELLANEOUS) ×1 IMPLANT
SHEATH AVANTI 11CM 5FR (MISCELLANEOUS) ×1 IMPLANT
SPONGE GAUZE 4X4 12PLY (GAUZE/BANDAGES/DRESSINGS) ×2 IMPLANT
SPONGE LAP 18X18 X RAY DECT (DISPOSABLE) ×1 IMPLANT
SUT MNCRL AB 4-0 PS2 18 (SUTURE) ×3 IMPLANT
SUT PROLENE 5 0 C 1 36 (SUTURE) ×1 IMPLANT
SUT PROLENE 6 0 BV (SUTURE) ×5 IMPLANT
SUT PROLENE 7 0 BV 1 (SUTURE) ×1 IMPLANT
SUT SILK 2 0 FS (SUTURE) IMPLANT
SUT VIC AB 3-0 SH 27 (SUTURE) ×4
SUT VIC AB 3-0 SH 27X BRD (SUTURE) ×1 IMPLANT
SYR 20CC LL (SYRINGE) ×2 IMPLANT
TOWEL OR 17X24 6PK STRL BLUE (TOWEL DISPOSABLE) ×2 IMPLANT
TOWEL OR 17X26 10 PK STRL BLUE (TOWEL DISPOSABLE) ×2 IMPLANT
UNDERPAD 30X30 INCONTINENT (UNDERPADS AND DIAPERS) ×2 IMPLANT
WATER STERILE IRR 1000ML POUR (IV SOLUTION) ×2 IMPLANT
WIRE BENTSON .035X145CM (WIRE) ×2 IMPLANT

## 2012-02-24 NOTE — Anesthesia Procedure Notes (Signed)
Procedure Name: Intubation Date/Time: 02/24/2012 8:11 AM Performed by: Wanita Chamberlain Pre-anesthesia Checklist: Patient identified, Emergency Drugs available, Suction available, Patient being monitored and Timeout performed Patient Re-evaluated:Patient Re-evaluated prior to inductionOxygen Delivery Method: Circle system utilized Preoxygenation: Pre-oxygenation with 100% oxygen Intubation Type: IV induction Ventilation: Two handed mask ventilation required and Oral airway inserted - appropriate to patient size Laryngoscope Size: Mac and 4 Grade View: Grade III Tube size: 7.5 mm Number of attempts: 1 Airway Equipment and Method: Stylet Placement Confirmation: ETT inserted through vocal cords under direct vision,  positive ETCO2,  CO2 detector and breath sounds checked- equal and bilateral Secured at: 24 cm Tube secured with: Tape Dental Injury: Teeth and Oropharynx as per pre-operative assessment  Difficulty Due To: Difficulty was anticipated

## 2012-02-24 NOTE — H&P (View-Only) (Signed)
[facility] [place]  Brief History and Physical  History of Present Illness  [name] is a 46 y.o. male who presents with chief complaint: ESRD.  The patient presents today for BUE venogram.    Past Medical History  Diagnosis Date  . Diabetes mellitus   . Hypertension   . Chest pain   . Morbid obesity   . CHF (congestive heart failure)     hx  . Bronchitis   . Blood transfusion   . H/O hiatal hernia   . Chronic kidney disease     dialysis, [facility]W-F, [facility]    Past Surgical History  Procedure Date  . Av fistula placement 10/11/2010  . Ankle fusion     2-3 years ago  . Av fistula placement 07/02/2011    Procedure: INSERTION OF ARTERIOVENOUS (AV) GORE-TEX GRAFT ARM;  Surgeon: Elam Dutch, MD;  Location: [facility];  Service: Vascular;  Laterality: Left;  . Thrombectomy   . Insertion of dialysis catheter 01/20/2012    Procedure: INSERTION OF DIALYSIS CATHETER;  Surgeon: Serafina Mitchell, MD;  Location: [facility];  Service: Vascular;  Laterality: N/A;  insertion left internal jugular dialysis catheter 23cm    History   Social History  . Marital Status: Legally Separated    Spouse Name: N/A    Number of Children: N/A  . Years of Education: N/A   Occupational History  . Not on file.   Social History Main Topics  . Smoking status: Former Smoker -- 0.5 packs/day for 20 years    Types: Cigarettes  . Smokeless tobacco: Never Used  . Alcohol Use: No  . Drug Use: Yes    Special: Marijuana  . Sexually Active: Not on file   Other Topics Concern  . Not on file   Social History Narrative  . No narrative on file    Family History  Problem Relation Age of Onset  . Heart disease Mother     No current facility-administered medications on file prior to encounter.   Current Outpatient Prescriptions on File Prior to Encounter  Medication Sig Dispense Refill  . b complex-vitamin c-folic acid (NEPHRO-VITE) 0.8 MG TABS Take 0.8 mg by mouth at bedtime.         . Calcium Carbonate-Simethicone (TUMS PLUS PO) Take 1-2 tablets by mouth daily as needed. For acid reflux.      . insulin aspart protamine-insulin aspart (NOVOLOG 70/30) (70-30) 100 UNIT/ML injection Inject 40-50 Units into the skin 2 (two) times daily with a meal. Take 40 units in the morning and 50 units in the evening        No Known Allergies  Review of Systems: As listed above, otherwise negative.  Physical Examination  Filed Vitals:   02/06/12 0625  BP: 110/64  Pulse: 103  Temp: 97.6 F (36.4 C)  TempSrc: Oral  Resp: 16  Height: 6\' 1"  (1.854 m)  Weight: 260 lb (117.935 kg)  SpO2: 99%    General: A&O x 3, WDWN  Pulmonary: Sym exp, good air movt, CTAB, no rales, rhonchi, & wheezing  Cardiac: RRR, Nl S1, S2, no Murmurs, rubs or gallops  Gastrointestinal: soft, NTND, -G/R, - HSM, - masses, - CVAT B  Musculoskeletal: M/S 5/5 throughout , Extremities without ischemic changes , no thrill or bruit in either arm  Laboratory See [facility]  [name] is a 46 y.o. male who presents with: end stage renal disease .  The patient is scheduled for: BUE arm and central venograms I discussed with the patient the nature of angiographic procedures, especially the limited patencies of any endovascular intervention.  The patient is aware of that the risks of an angiographic procedure include but are not limited to: bleeding, infection, access site complications, renal failure, embolization, rupture of vessel, dissection, possible need for emergent surgical intervention, possible need for surgical procedures to treat the patient's pathology, and stroke and death.    The patient is aware of the risks and agrees to proceed.  Adele Barthel, MD [facility] [place] Office: [phone] Pager: [phone]  02/06/2012, 7:43 AM

## 2012-02-24 NOTE — Interval H&P Note (Signed)
After re-evaluation of patient's left arm, there are two graft interfering with routing of the AVG.  I recommended we swtich to placement of the Hybrid AVG in his R arm.  Risk, benefits, and alternatives to access surgery were discussed.  The patient is aware the risks include but are not limited to: bleeding, infection, steal syndrome, nerve damage, ischemic monomelic neuropathy, failure to mature, need for additional procedures, death and stroke.  The patient agrees to proceed forward with the procedure.  Adele Barthel, MD [facility] [place] Office: [phone] Pager: [phone]  02/24/2012, 7:26 AM

## 2012-02-24 NOTE — Telephone Encounter (Signed)
Message copied by Gena Fray on Mon Feb 24, 2012  4:41 PM ------      Message from: Denman George      Created: Mon Feb 24, 2012  2:04 PM                   ----- Message -----         From: Conrad Toco, MD         Sent: 02/24/2012  10:52 AM           To: Patrici Ranks, Alfonso Patten, RN            [name]      [id number]      11-08-1965            Procedure:       1. Open cannulation of right axillary vein      2. Right arm venogram      3. Stenting right axillary vein (Gore Hybrid graft)      4.  Angioplasty of right axillary vein      5.  Placement of right upper arm arteriovenous graft  (Gore Hybrid graft)            Asst: Leontine Locket, PAC             Follow-up: 4 weeks

## 2012-02-24 NOTE — Preoperative (Signed)
Beta Blockers   Reason not to administer Beta Blockers:Not Applicable 

## 2012-02-24 NOTE — Interval H&P Note (Signed)
[facility] [place]  History and Physical Update  The patient was interviewed and re-examined.  The patient's previous History and Physical has been reviewed and is unchanged except for: interval BUE venogram.  Based on those venograms, left arm hybrid AVG placement is the next access intervention.  Adele Barthel, MD [facility] [place] Office: [phone] Pager: [phone]  02/24/2012, 6:55 AM

## 2012-02-24 NOTE — Telephone Encounter (Signed)
Spoke with patient to notify of follow up appointment, dpm

## 2012-02-24 NOTE — Transfer of Care (Signed)
Immediate Anesthesia Transfer of Care Note  Patient: [name]  Procedure(s) Performed: Procedure(s) (LRB): INSERTION HYBRID ANTERIOVENOUS GORTEX GRAFT (Right)  Patient Location: PACU  Anesthesia Type: General  Level of Consciousness: awake, alert , oriented and patient cooperative  Airway & Oxygen Therapy: Patient Spontanous Breathing and Patient connected to nasal cannula oxygen  Post-op Assessment: Report given to PACU RN and Post -op Vital signs reviewed and stable  Post vital signs: Reviewed and stable  Complications: No apparent anesthesia complications

## 2012-02-24 NOTE — Progress Notes (Signed)
Unable to draw labs. Attempted to call Dr. Leda Quail with no answer. Paged Dr. Leda Quail to see if labs can be done in holding area when IV access is obtained.

## 2012-02-24 NOTE — Progress Notes (Signed)
HD blue and red port capped off. Flushed with 10cc NS with GBR. Flushed with Heparin 2.30ml (1000u/ml) per priming volume in the blue port and 2.87ml (1000u/ml) per priming volume. Cap cleaned, dead end cap placed on the blue and red port, both clamps taped, red "high dose heparin" sticker placed on the cathter.  Catalina Pizza

## 2012-02-24 NOTE — Anesthesia Preprocedure Evaluation (Addendum)
Anesthesia Evaluation  Patient identified by MRN, date of birth, ID band Patient awake    Reviewed: Allergy & Precautions, H&P , NPO status , Patient's Chart, lab work & pertinent test results  Airway Mallampati: III TM Distance: >3 FB Neck ROM: Full    Dental No notable dental hx. (+) Teeth Intact, Poor Dentition and Dental Advisory Given   Pulmonary Current Smoker,  breath sounds clear to auscultation  Pulmonary exam normal       Cardiovascular hypertension, + dysrhythmias Rhythm:Regular Rate:Normal     Neuro/Psych negative neurological ROS  negative psych ROS   GI/Hepatic Neg liver ROS, hiatal hernia, GERD-  Controlled and Medicated,  Endo/Other  Well Controlled, Type 1, Insulin Dependent  Renal/GU Dialysis and ESRFRenal disease  negative genitourinary   Musculoskeletal   Abdominal   Peds  Hematology negative hematology ROS (+)   Anesthesia Other Findings   Reproductive/Obstetrics negative OB ROS                          Anesthesia Physical Anesthesia Plan  ASA: III  Anesthesia Plan: General   Post-op Pain Management:    Induction: Intravenous  Airway Management Planned: Oral ETT  Additional Equipment:   Intra-op Plan:   Post-operative Plan: Extubation in OR  Informed Consent: I have reviewed the patients History and Physical, chart, labs and discussed the procedure including the risks, benefits and alternatives for the proposed anesthesia with the patient or authorized representative who has indicated his/her understanding and acceptance.   Dental advisory given  Plan Discussed with: CRNA  Anesthesia Plan Comments:         Anesthesia Quick Evaluation

## 2012-02-24 NOTE — Anesthesia Postprocedure Evaluation (Signed)
  Anesthesia Post-op Note  Patient: [name]  Procedure(s) Performed: Procedure(s) (LRB): INSERTION HYBRID ANTERIOVENOUS GORTEX GRAFT (Right)  Patient Location: PACU  Anesthesia Type: General  Level of Consciousness: awake  Airway and Oxygen Therapy: Patient Spontanous Breathing and Patient connected to nasal cannula oxygen  Post-op Pain: mild  Post-op Assessment: Post-op Vital signs reviewed, Patient's Cardiovascular Status Stable, Respiratory Function Stable, Patent Airway and No signs of Nausea or vomiting  Post-op Vital Signs: Reviewed and stable  Complications: No apparent anesthesia complications

## 2012-02-24 NOTE — Op Note (Addendum)
BRIEF OPERATIVE NOTE   PROCEDURE: 1. Open cannulation of right axillary vein 2. Right arm venogram 3. Stenting right axillary vein (Gore Hybrid graft) 4. Angioplasty of right axillary vein 5. Placement of right upper arm arteriovenous graft  (Gore Hybrid graft)  PRE-OPERATIVE DIAGNOSIS: end stage renal disease, difficult axillary vein access  POST-OPERATIVE DIAGNOSIS: same as above   SURGEON: Adele Barthel, MD  ASSISTANT(S): Leontine Locket, PAC   ANESTHESIA: general  ESTIMATED BLOOD LOSS: 400 cc  FINDING(S): 1. 8 mm axillary vein 2. 3-4 mm atherosclerotic brachial artery 3. Weakly palpable left upper arm thrill 4. Dopplerable left radial signal  SPECIMEN(S):  None  INDICATION: [name] is a 46 y.o. male who presents with end stage renal disease requiring hemodialysis.  The patient has undergone multiple arm access procedures and was found to have a patent but very proximal axillary vein that was not amendable to traditional surgical revision.   I discussed with the patient proceeding with placement of a hybrid Gore arteriovenous graft.  Risk, benefits, and alternatives to access surgery were discussed.  The patient is aware the risks include but are not limited to: bleeding, infection, steal syndrome, nerve damage, ischemic monomelic neuropathy, failure to mature, need for additional procedures, death and stroke.  The patient agrees to proceed forward with the procedure.  DESCRIPTION: After full informed written consent was obtained from the patient, the patient was brought back to the operating room and place supine upon the operating table.  The patient was given IV antibiotics prior to induction.  After obtaining adequate anesthesia, the patient was prepped and draped in the standard fashion for a right arm access procedure.  I made an longitudinal incision in the axilla over the axillary vein.  I dissected through the previous scar tissue until I had dissected out the  proximal axillary vein.  The vein was quite deep in the axilla and not amendable to standard suture anastomosis.  I then turned my attention to the antecubitum.  I made a longitudinal incision over the brachial artery, proximal to previous grafts.  I dissected down to the artery and placed vessels loops proximal and distally around the artery.  I turned my attention back to the axilla.  I cannulated the proximal axillary vein with a 18 gauge needle and passed a Benson wire into the central vein under fluoroscopy.  I exchanged the needle for a 5-Fr sheath.  I did a hand injection to image the axillary vein and more proximal central veins.  Based on these images, I selected a 9 mm sized stent for the Gore Hybrid arteriovenous graft.  I then exchanged the sheath for a 13-Fr peel away sheath as the 14-Fr sheath was not available.  The Gore Hybrid arteriovenous graft with 9 mm stent was loaded over the wire and I tried to load it into the sheath.  Unfortunately, the stent portion would not pass through the sheath and the proximal portion of the stent segment partially opened even though the constraining string was still in place.  The bulk of the bleeding occurred during this portion of the case.  I had to removed this defective Gore hybrid graft and exchange the sheath for a 14-Fr sheath.  A new Gore Hybrid arteriovenous graft with 9 mm stent was loaded over the wire and loaded into the sheath.   I peeled away the sheath while advancing the stent manually over the wire.  Eventually the stent was loaded the axillary vein and the sheath peeled  away completely.  I pulled the release cord on the stent component of the Hybrid Gore arteriovenous graft.  I then loaded a 8 mm x 40 mm angioplasty balloon over the wire under fluoroscopy until it was over the proximal section of the stent and inflated it to profile.  The balloon was removed and a hand injection was completed which demonstrated: complete wall apposition without  more proximal stenosis in the venous system.  There was a slight waist (<10%) at the insertion site.  At this point, I gave the patient 5000 units of Heparin and clamped the graft distal to the stent.  I stretched out the graft at this time while holding the proximal graft.  I dissected a tunnel with a Gore metal tunneler from the antecubital to the axillary incision.  The graft was delivered through the metal tunnel, maintaining orientation of the graft,  and the tunnel was removed.  The graft was pulled to appropriate tension and cut to appropriate length, spatulating it in the process.  I placed the brachial artery under tension proximally and distally.  I made an arteriotomy with a 11-blade and extended it with a Pott's scissor to match the dimensions of the graft.  The graft was sewn to the artery in an end-to-side configuration with a running stitch of 6-0 Prolene.  Prior to completing this anastomosis, I allowed the vein to back bleed.  There was: good back bleeding.  I allowed the brachial artery to back bleeding.  There was: good back bleeding.  The anastomosis was completed in the usual fashion.  All clamps and vessel loops were released.  Thrombin and gelfoam was placed into both incisions.  Bleeding points were controlled with electrocautery or suture ligated.  Eventually no further active bleeding was present.  Each incision was repaired with a layer of 3-0 Vicryl in the subcutaneous layer and a layer of 4-0 Monocryl in the subcuticular layer.  The skin was cleaned, dried, and reinforced with Dermabbond.  There was a weakly palpable thrill in the upper arm.  There was a dopplerable radial signal.  COMPLICATIONS: none  CONDITION: stable   Adele Barthel, MD [facility] [place] Office: [phone] Pager: [phone]  02/24/2012, 10:43 AM

## 2012-02-25 ENCOUNTER — Other Ambulatory Visit (HOSPITAL_COMMUNITY): Payer: Self-pay | Admitting: Nephrology

## 2012-02-25 ENCOUNTER — Ambulatory Visit (HOSPITAL_COMMUNITY)
Admission: RE | Admit: 2012-02-25 | Discharge: 2012-02-25 | Disposition: A | Discharge status: Home / Self Care | Payer: Medicare Other | Source: Ambulatory Visit | Attending: Nephrology | Admitting: Nephrology

## 2012-02-25 VITALS — BP 144/66 | HR 103 | Resp 15

## 2012-02-25 DIAGNOSIS — N186 End stage renal disease: Secondary | ICD-10-CM

## 2012-02-25 DIAGNOSIS — Y849 Medical procedure, unspecified as the cause of abnormal reaction of the patient, or of later complication, without mention of misadventure at the time of the procedure: Secondary | ICD-10-CM | POA: Insufficient documentation

## 2012-02-25 DIAGNOSIS — T82898A Other specified complication of vascular prosthetic devices, implants and grafts, initial encounter: Secondary | ICD-10-CM | POA: Insufficient documentation

## 2012-02-25 LAB — GLUCOSE, CAPILLARY: Glucose-Capillary: 116 mg/dL — ABNORMAL HIGH (ref 70–99)

## 2012-02-25 MED ORDER — CHLORHEXIDINE GLUCONATE 4 % EX LIQD
CUTANEOUS | Status: AC
  Filled 2012-02-25: qty 15

## 2012-02-25 MED ORDER — CEFAZOLIN SODIUM 1-5 GM-% IV SOLN
INTRAVENOUS | Status: AC
  Filled 2012-02-25: qty 50

## 2012-02-25 MED ORDER — CEFAZOLIN SODIUM-DEXTROSE 2-3 GM-% IV SOLR
2.0000 g | Freq: Once | INTRAVENOUS | Status: DC
  Filled 2012-02-25: qty 50

## 2012-02-25 MED ORDER — CHLORHEXIDINE GLUCONATE 4 % EX LIQD
CUTANEOUS | Status: AC
  Filled 2012-02-25: qty 45

## 2012-02-25 MED ORDER — HEPARIN SODIUM (PORCINE) 1000 UNIT/ML IJ SOLN
INTRAMUSCULAR | Status: AC
  Filled 2012-02-25: qty 1

## 2012-02-25 NOTE — Procedures (Signed)
LIJV HD cath exchange 27 cm RA

## 2012-03-26 ENCOUNTER — Encounter: Payer: Self-pay | Admitting: Vascular Surgery

## 2012-03-27 ENCOUNTER — Ambulatory Visit: Payer: Medicare Other | Admitting: Vascular Surgery

## 2012-04-09 ENCOUNTER — Encounter: Payer: Self-pay | Admitting: Vascular Surgery

## 2012-04-10 ENCOUNTER — Ambulatory Visit (INDEPENDENT_AMBULATORY_CARE_PROVIDER_SITE_OTHER): Payer: Medicare Other | Admitting: Vascular Surgery

## 2012-04-10 ENCOUNTER — Encounter: Payer: Self-pay | Admitting: Vascular Surgery

## 2012-04-10 VITALS — BP 159/84 | HR 103 | Resp 16 | Ht 73.0 in | Wt 272.0 lb

## 2012-04-10 DIAGNOSIS — N186 End stage renal disease: Secondary | ICD-10-CM

## 2012-04-10 NOTE — Progress Notes (Signed)
[facility] [place]  Postoperative Access Visit  History of Present Illness  [name] is a 46 y.o. year old male who presents for postoperative follow-up for: RUA hybrid AVG  Placement (Date: 02/24/12).  The patient's wounds are nearly healed.  The patient notes no steal symptoms.  The patient is able to complete their activities of daily living.  The patient's current symptoms are: none.  Physical Examination  Filed Vitals:   04/10/12 1434  BP: 159/84  Pulse: 103  Resp: 16   RUE: Incision is nearly healed except some residual scab on distal incision, skin feels warm, hand grip is 5/5, sensation in digits is  intact, palpable thrill, bruit can  be auscultated   Medical Decision Making  [name] is a 47 y.o. year old male who presents s/p RUA hybrid AVG placement  The patient's access is ready for use.  The patient's tunneled dialysis catheter can be removed after two successful cannulations and completed dialysis treatments.  Thank you for allowing [place] to participate in this patient's care.  Adele Barthel, MD [facility] [place] Office: [phone] Pager: [phone]

## 2012-04-30 ENCOUNTER — Other Ambulatory Visit (HOSPITAL_COMMUNITY): Payer: Self-pay | Admitting: Nephrology

## 2012-04-30 DIAGNOSIS — N186 End stage renal disease: Secondary | ICD-10-CM

## 2012-05-05 ENCOUNTER — Ambulatory Visit (HOSPITAL_COMMUNITY): Admission: RE | Admit: 2012-05-05 | Payer: Medicare Other | Source: Ambulatory Visit

## 2012-05-12 ENCOUNTER — Ambulatory Visit (HOSPITAL_COMMUNITY)
Admission: RE | Admit: 2012-05-12 | Discharge: 2012-05-12 | Disposition: A | Discharge status: Home / Self Care | Payer: Medicare Other | Source: Ambulatory Visit | Attending: Nephrology | Admitting: Nephrology

## 2012-05-12 VITALS — BP 128/65 | HR 91 | Resp 16

## 2012-05-12 DIAGNOSIS — Z4901 Encounter for fitting and adjustment of extracorporeal dialysis catheter: Secondary | ICD-10-CM | POA: Insufficient documentation

## 2012-05-12 DIAGNOSIS — N186 End stage renal disease: Secondary | ICD-10-CM | POA: Insufficient documentation

## 2012-05-12 MED ORDER — CHLORHEXIDINE GLUCONATE 4 % EX LIQD
CUTANEOUS | Status: AC
  Filled 2012-05-12: qty 30

## 2012-05-12 NOTE — Progress Notes (Signed)
Dressing change done by [facility] RT. (Small amount of sero sanguinous on one corner of dressing left upper chest) Chris RT took dressing down to skin and redressed. Area not bleeding. Patient discharged with instructions to put pressure on area with fingertips if bleeding recurred and to seek medical care if it doesn't stop.

## 2012-05-12 NOTE — Procedures (Signed)
Left tunneled IJ HD catheter removed in its entirety without immediate complications. Vaseline gauze dressing applied to site. Medication utilized- 1% lidocaine to skin and SQ tissues.

## 2012-05-13 ENCOUNTER — Telehealth (HOSPITAL_COMMUNITY): Payer: Self-pay | Admitting: *Deleted

## 2012-05-13 NOTE — Telephone Encounter (Signed)
Radiology post procedure phone call.  Message left to call for any problems or questions since cath removal yesterday.

## 2012-08-01 ENCOUNTER — Other Ambulatory Visit (HOSPITAL_COMMUNITY): Payer: Self-pay | Admitting: Nephrology

## 2012-08-01 DIAGNOSIS — N186 End stage renal disease: Secondary | ICD-10-CM

## 2012-08-03 ENCOUNTER — Other Ambulatory Visit (HOSPITAL_COMMUNITY): Payer: Self-pay | Admitting: Nephrology

## 2012-08-03 ENCOUNTER — Encounter (HOSPITAL_COMMUNITY): Payer: Self-pay

## 2012-08-03 ENCOUNTER — Ambulatory Visit (HOSPITAL_COMMUNITY)
Admission: RE | Admit: 2012-08-03 | Discharge: 2012-08-03 | Disposition: A | Discharge status: Home / Self Care | Payer: Medicare Other | Source: Ambulatory Visit | Attending: Nephrology | Admitting: Nephrology

## 2012-08-03 DIAGNOSIS — Y832 Surgical operation with anastomosis, bypass or graft as the cause of abnormal reaction of the patient, or of later complication, without mention of misadventure at the time of the procedure: Secondary | ICD-10-CM | POA: Insufficient documentation

## 2012-08-03 DIAGNOSIS — N186 End stage renal disease: Secondary | ICD-10-CM

## 2012-08-03 DIAGNOSIS — T82898A Other specified complication of vascular prosthetic devices, implants and grafts, initial encounter: Secondary | ICD-10-CM | POA: Insufficient documentation

## 2012-08-03 DIAGNOSIS — E119 Type 2 diabetes mellitus without complications: Secondary | ICD-10-CM | POA: Insufficient documentation

## 2012-08-03 DIAGNOSIS — I120 Hypertensive chronic kidney disease with stage 5 chronic kidney disease or end stage renal disease: Secondary | ICD-10-CM | POA: Insufficient documentation

## 2012-08-03 HISTORY — DX: Dependence on renal dialysis: Z99.2

## 2012-08-03 HISTORY — DX: End stage renal disease: N18.6

## 2012-08-03 LAB — POCT I-STAT 4, (NA,K, GLUC, HGB,HCT)
Glucose, Bld: 233 mg/dL — ABNORMAL HIGH (ref 70–99)
Potassium: 5 mEq/L (ref 3.5–5.1)
Sodium: 132 mEq/L — ABNORMAL LOW (ref 135–145)

## 2012-08-03 MED ORDER — IOHEXOL 300 MG/ML  SOLN
100.0000 mL | Freq: Once | INTRAMUSCULAR | Status: AC | PRN
  Administered 2012-08-03: 50 mL via INTRAVENOUS

## 2012-08-03 MED ORDER — ALTEPLASE 2 MG IJ SOLR
INTRAMUSCULAR | Status: AC | PRN
  Administered 2012-08-03: 4 mg

## 2012-08-03 MED ORDER — HEPARIN SODIUM (PORCINE) 1000 UNIT/ML IJ SOLN
INTRAMUSCULAR | Status: AC
  Filled 2012-08-03: qty 1

## 2012-08-03 MED ORDER — ALTEPLASE 2 MG IJ SOLR
4.0000 mg | Freq: Once | INTRAMUSCULAR | Status: DC
  Filled 2012-08-03: qty 4

## 2012-08-03 MED ORDER — HEPARIN SODIUM (PORCINE) 1000 UNIT/ML IJ SOLN
INTRAMUSCULAR | Status: AC | PRN
  Administered 2012-08-03: 3000 [IU] via INTRAVENOUS

## 2012-08-03 NOTE — Procedures (Signed)
Technically successful declot of right upper arm dialysis graft.  No immediate post procedural complications.

## 2012-08-03 NOTE — H&P (Signed)
[name] is an 47 y.o. male.   Chief Complaint: clotted right upper extremity hybrid AVG. Patient presents today for declot procedure of this access.  HPI: Noted to be clotted - recent issues with dialysis per patient - this is the first intervention on this access which was placed by Dr. Bridgett Larsson - see operative and office note below :   Patient Name  Sex  DOB  SSN    Ricky, Jefferson  Male  1966-07-08  [id number]     Op Note signed by Conrad Hazel Green, MD at 02/24/12 1053     Author:  Conrad , MD  Service:  Vascular Surgery  Author Type:  Physician    Filed:  02/24/12 1053  Note Time:  02/24/12 1042         Related Notes:  Original Note by: Conrad , MD filed at 02/24/12 1052       BRIEF OPERATIVE NOTE   PROCEDURE: 1. Open cannulation of right axillary vein  2. Right arm venogram  3. Stenting right axillary vein (Gore Hybrid graft)  4. Angioplasty of right axillary vein  5. Placement of right upper arm arteriovenous graft  (Gore Hybrid graft)  PRE-OPERATIVE DIAGNOSIS: end stage renal disease, difficult axillary vein access  POST-OPERATIVE DIAGNOSIS: same as above   SURGEON: Adele Barthel, MD  ASSISTANT(S): Leontine Locket, PAC   ANESTHESIA: general  ESTIMATED BLOOD LOSS: 400 cc  FINDING(S): 1. 8 mm axillary vein  2. 3-4 mm atherosclerotic brachial artery  3. Weakly palpable left upper arm thrill  4. Dopplerable left radial signal  SPECIMEN(S):  None  INDICATION: RAIYAN DORRY is a 47 y.o. male who presents with end stage renal disease requiring hemodialysis.  The patient has undergone multiple arm access procedures and was found to have a patent but very proximal axillary vein that was not amendable to traditional surgical revision.   I discussed with the patient proceeding with placement of a hybrid Gore arteriovenous graft.  Risk, benefits, and alternatives to access surgery were discussed.  The patient is aware the risks include but are not limited to:  bleeding, infection, steal syndrome, nerve damage, ischemic monomelic neuropathy, failure to mature, need for additional procedures, death and stroke.  The patient agrees to proceed forward with the procedure.  DESCRIPTION: After full informed written consent was obtained from the patient, the patient was brought back to the operating room and place supine upon the operating table.  The patient was given IV antibiotics prior to induction.  After obtaining adequate anesthesia, the patient was prepped and draped in the standard fashion for a right arm access procedure.  I made an longitudinal incision in the axilla over the axillary vein.  I dissected through the previous scar tissue until I had dissected out the proximal axillary vein.  The vein was quite deep in the axilla and not amendable to standard suture anastomosis.  I then turned my attention to the antecubitum.  I made a longitudinal incision over the brachial artery, proximal to previous grafts.  I dissected down to the artery and placed vessels loops proximal and distally around the artery.  I turned my attention back to the axilla.  I cannulated the proximal axillary vein with a 18 gauge needle and passed a Benson wire into the central vein under fluoroscopy.  I exchanged the needle for a 5-Fr sheath.  I did a hand injection to image the axillary vein and more proximal central veins.  Based on  these images, I selected a 9 mm sized stent for the Gore Hybrid arteriovenous graft.  I then exchanged the sheath for a 13-Fr peel away sheath as the 14-Fr sheath was not available.  The Gore Hybrid arteriovenous graft with 9 mm stent was loaded over the wire and I tried to load it into the sheath.  Unfortunately, the stent portion would not pass through the sheath and the proximal portion of the stent segment partially opened even though the constraining string was still in place.  The bulk of the bleeding occurred during this portion of the case.  I had to  removed this defective Gore hybrid graft and exchange the sheath for a 14-Fr sheath.  A new Gore Hybrid arteriovenous graft with 9 mm stent was loaded over the wire and loaded into the sheath.   I peeled away the sheath while advancing the stent manually over the wire.  Eventually the stent was loaded the axillary vein and the sheath peeled away completely.  I pulled the release cord on the stent component of the Hybrid Gore arteriovenous graft.  I then loaded a 8 mm x 40 mm angioplasty balloon over the wire under fluoroscopy until it was over the proximal section of the stent and inflated it to profile.  The balloon was removed and a hand injection was completed which demonstrated: complete wall apposition without more proximal stenosis in the venous system.  There was a slight waist (<10%) at the insertion site.  At this point, I gave the patient 5000 units of Heparin and clamped the graft distal to the stent.  I stretched out the graft at this time while holding the proximal graft.  I dissected a tunnel with a Gore metal tunneler from the antecubital to the axillary incision.  The graft was delivered through the metal tunnel, maintaining orientation of the graft,  and the tunnel was removed.  The graft was pulled to appropriate tension and cut to appropriate length, spatulating it in the process.  I placed the brachial artery under tension proximally and distally.  I made an arteriotomy with a 11-blade and extended it with a Pott's scissor to match the dimensions of the graft.  The graft was sewn to the artery in an end-to-side configuration with a running stitch of 6-0 Prolene.  Prior to completing this anastomosis, I allowed the vein to back bleed.  There was: good back bleeding.  I allowed the brachial artery to back bleeding.  There was: good back bleeding.  The anastomosis was completed in the usual fashion.  All clamps and vessel loops were released.  Thrombin and gelfoam was placed into both incisions.   Bleeding points were controlled with electrocautery or suture ligated.  Eventually no further active bleeding was present.  Each incision was repaired with a layer of 3-0 Vicryl in the subcutaneous layer and a layer of 4-0 Monocryl in the subcuticular layer.  The skin was cleaned, dried, and reinforced with Dermabbond.  There was a weakly palpable thrill in the upper arm.  There was a dopplerable radial signal.  COMPLICATIONS: none  CONDITION: stable   Adele Barthel, MD [facility] Office: [phone] Pager: [phone]  02/24/2012, 10:43 AM   [facility]  Postoperative Access Visit  History of Present Illness  JAECEON SMETHURST is a 47 y.o. year old male who presents for postoperative follow-up for: RUA hybrid AVG  Placement (Date: 02/24/12).  The patient's wounds are nearly healed.  The patient notes no steal symptoms.  The patient is able to complete their activities of daily living.  The patient's current symptoms are: none.  Physical Examination    Filed Vitals:     04/10/12 1434   BP:  159/84   Pulse:  103   Resp:  16    RUE: Incision is nearly healed except some residual scab on distal incision, skin feels warm, hand grip is 5/5, sensation in digits is  intact, palpable thrill, bruit can  be auscultated   Medical Decision Making  TERELL STITELER is a 47 y.o. year old male who presents s/p RUA hybrid AVG placement  The patient's access is ready for use.   The patient's tunneled dialysis catheter can be removed after two successful cannulations and completed dialysis treatments.   Thank you for allowing [place] to participate in this patient's care.  Adele Barthel, MD [facility] Office: [phone] Pager: [phone]   Past Medical History  Diagnosis Date  . Diabetes mellitus   . Hypertension   . Chest pain   . Morbid obesity   . Bronchitis   . Blood transfusion   . H/O  hiatal hernia   . Chronic kidney disease     dialysis, [facility]  . GERD (gastroesophageal reflux disease)   . Dysrhythmia     irregular heartbeat  . Hyperparathyroidism   . ESRD (end stage renal disease) on dialysis     first HD 06/19/04    Past Surgical History  Procedure Date  . Av fistula placement 10/11/2010  . Ankle fusion     rt foot charcot joint 2008 with refusion  . Av fistula placement 07/02/2011    Procedure: INSERTION OF ARTERIOVENOUS (AV) GORE-TEX GRAFT ARM;  Surgeon: Elam Dutch, MD;  Location: [facility];  Service: Vascular;  Laterality: Left;  . Thrombectomy   . Insertion of dialysis catheter 01/20/2012    Procedure: INSERTION OF DIALYSIS CATHETER;  Surgeon: Serafina Mitchell, MD;  Location: [facility];  Service: Vascular;  Laterality: N/A;  insertion left internal jugular dialysis catheter 23cm  . Umbilcial hernia   . Parathyroidectomy     Family History  Problem Relation Age of Onset  . Heart disease Mother    Social History:  reports that he has been smoking Cigarettes.  He has a 2 pack-year smoking history. He has never used smokeless tobacco. He reports that he does not drink alcohol or use illicit drugs.  Allergies: No Known Allergies   (Not in a hospital admission)  No results found for this or any previous visit (from the past 48 hour(s)). No results found.  Review of Systems  Constitutional: Negative for fever, chills and weight loss.  Respiratory: Negative.   Cardiovascular: Negative.   Gastrointestinal: Negative.   Musculoskeletal: Negative.   Skin: Negative.   Neurological: Negative.   Psychiatric/Behavioral: Negative.     Physical Exam  Constitutional: He is oriented to person, place, and time. He appears well-developed and well-nourished. No distress.  HENT:  Head: Normocephalic and atraumatic.  Cardiovascular: Normal rate, regular rhythm and normal heart sounds.  Exam reveals no gallop and no friction rub.   No murmur  heard. Respiratory: Effort normal and breath sounds normal. No respiratory distress. He has no wheezes. He has no rales.  GI: Soft. Bowel sounds are normal.  Musculoskeletal: Normal range of motion. He exhibits no edema and no tenderness.       Scarring to medial aspect  of Right upper arm post operative - no erythema or edema. No thrill appreciated on exam. No edema noted. Arm with FROM and distal pulses intact.   Neurological: He is alert and oriented to person, place, and time.  Skin: Skin is warm and dry.  Psychiatric: He has a normal mood and affect. His behavior is normal. Judgment and thought content normal.     Assessment/Plan Procedure details for declot procedure discussed with patient with all his questions answered to his satisfaction. Written consent obtained. Patient aware that HD catheter may need to be placed in the event declot is unsuccessful and is in agreement with plan.   Saket Hellstrom D 08/03/2012, 10:53 AM

## 2012-08-03 NOTE — ED Notes (Addendum)
Declot procedure to be done without sedation per pt preference. To monitor pt's VS throghout

## 2012-08-05 ENCOUNTER — Other Ambulatory Visit: Payer: Self-pay | Admitting: Nephrology

## 2012-08-05 DIAGNOSIS — N2889 Other specified disorders of kidney and ureter: Secondary | ICD-10-CM

## 2012-08-11 ENCOUNTER — Other Ambulatory Visit: Payer: Medicare Other

## 2012-08-20 ENCOUNTER — Inpatient Hospital Stay: Admission: RE | Admit: 2012-08-20 | Payer: Medicare Other | Source: Ambulatory Visit

## 2012-10-21 ENCOUNTER — Encounter (HOSPITAL_COMMUNITY): Payer: Self-pay | Admitting: Emergency Medicine

## 2012-10-21 ENCOUNTER — Emergency Department (INDEPENDENT_AMBULATORY_CARE_PROVIDER_SITE_OTHER)
Admission: EM | Admit: 2012-10-21 | Discharge: 2012-10-21 | Disposition: A | Discharge status: Home / Self Care | Payer: Medicare Other | Source: Home / Self Care

## 2012-10-21 ENCOUNTER — Emergency Department (INDEPENDENT_AMBULATORY_CARE_PROVIDER_SITE_OTHER): Payer: Medicare Other

## 2012-10-21 DIAGNOSIS — E119 Type 2 diabetes mellitus without complications: Secondary | ICD-10-CM

## 2012-10-21 DIAGNOSIS — J069 Acute upper respiratory infection, unspecified: Secondary | ICD-10-CM

## 2012-10-21 DIAGNOSIS — R509 Fever, unspecified: Secondary | ICD-10-CM

## 2012-10-21 DIAGNOSIS — N186 End stage renal disease: Secondary | ICD-10-CM

## 2012-10-21 NOTE — ED Notes (Signed)
Pt is here for cold sx onset Saturday Sx include: fever, cough w/clear mucous, nasal congestion, loss of appetite Denies: v/n/d Seen by Kidney specialist today and was given Antibiotics pending blood culture Came here for persistent fever  He is alert and oriented w/no signs of acute distress.

## 2012-10-21 NOTE — ED Provider Notes (Signed)
History     CSN: [id number]  Arrival date & time 10/21/12  1636   First MD Initiated Contact with Patient 10/21/12 1646      Chief Complaint  Patient presents with  . URI    (Consider location/radiation/quality/duration/timing/severity/associated sxs/prior treatment) HPI Comments: 47 year old obese male with a significant past medical history including diabetes mellitus, chronic renal by your requiring dialysis, hypertension, morbid obesity, anemia, GERD, dysrhythmias, hyperparathyroidism, and chest pains. This morning he was at dialysis and developed a fever. He states they started him on IV ABX's. He went home and took a Tylenol.  He did not receive an antibiotic prescription. He is complaining of minor sore throat, fever, nasal congestion, PND, cough, runny nose and change in voice. He also complains of a midsternal area of soreness it is nonradiating.   Past Medical History  Diagnosis Date  . Diabetes mellitus   . Hypertension   . Chest pain   . Morbid obesity   . Bronchitis   . Blood transfusion   . H/O hiatal hernia   . Chronic kidney disease     dialysis, [facility]  . GERD (gastroesophageal reflux disease)   . Dysrhythmia     irregular heartbeat  . Hyperparathyroidism   . ESRD (end stage renal disease) on dialysis     first HD 06/19/04    Past Surgical History  Procedure Laterality Date  . Av fistula placement  10/11/2010  . Ankle fusion      rt foot charcot joint 2008 with refusion  . Av fistula placement  07/02/2011    Procedure: INSERTION OF ARTERIOVENOUS (AV) GORE-TEX GRAFT ARM;  Surgeon: Elam Dutch, MD;  Location: [facility];  Service: Vascular;  Laterality: Left;  . Thrombectomy    . Insertion of dialysis catheter  01/20/2012    Procedure: INSERTION OF DIALYSIS CATHETER;  Surgeon: Serafina Mitchell, MD;  Location: [facility];  Service: Vascular;  Laterality: N/A;  insertion left internal jugular dialysis catheter 23cm  . Umbilcial hernia    . Parathyroidectomy       Family History  Problem Relation Age of Onset  . Heart disease Mother     History  Substance Use Topics  . Smoking status: Current Some Day Smoker -- 0.10 packs/day for 20 years    Types: Cigarettes  . Smokeless tobacco: Never Used     Comment: pt states he only smokes about 3 cigs per week  . Alcohol Use: No      Review of Systems  Constitutional: Positive for fever and activity change.  HENT: Positive for congestion, rhinorrhea, postnasal drip and sinus pressure. Negative for neck pain and ear discharge.         See history of present illness  Respiratory: Positive for cough. Negative for shortness of breath.   Cardiovascular: Negative for chest pain and palpitations.  Gastrointestinal: Negative.   Genitourinary: Negative.   Musculoskeletal: Negative.   Hematological: Negative.     Allergies  Review of patient's allergies indicates no known allergies.  Home Medications   Current Outpatient Rx  Name  Route  Sig  Dispense  Refill  . b complex-vitamin c-folic acid (NEPHRO-VITE) 0.8 MG TABS   Oral   Take 0.8 mg by mouth at bedtime.           . Calcium Carbonate-Simethicone (TUMS PLUS PO)   Oral   Take 1-2 tablets by mouth daily as needed. For acid reflux.         Marland Kitchen  insulin aspart protamine-insulin aspart (NOVOLOG 70/30) (70-30) 100 UNIT/ML injection   Subcutaneous   Inject 40-50 Units into the skin 2 (two) times daily with a meal. Take 40 units in the morning and 50 units in the evening         . oxyCODONE (OXY IR/ROXICODONE) 5 MG immediate release tablet               . oxyCODONE-acetaminophen (PERCOCET) 7.5-325 MG per tablet   Oral   Take 1 tablet by mouth every 4 (four) hours as needed. For pain         . oxyCODONE-acetaminophen (PERCOCET/ROXICET) 5-325 MG per tablet                 BP 138/73  Pulse 137  Temp(Src) 99.2 F (37.3 C) (Oral)  Resp 20  Physical Exam  Nursing note and vitals reviewed. Constitutional: He is oriented  to person, place, and time. He appears well-developed. No distress.  HENT:  Bilateral TMs are normal Oropharynx difficult to see due to large tongue and inability to relax it for visualization.  Eyes: Conjunctivae and EOM are normal.  Neck: Normal range of motion. Neck supple.  Cardiovascular: Regular rhythm.   Tachycardic  Pulmonary/Chest: Effort normal and breath sounds normal. He has no wheezes.  Minor faint coarseness with cough only.  Musculoskeletal: He exhibits no edema.  Lymphadenopathy:    He has no cervical adenopathy.  Neurological: He is alert and oriented to person, place, and time.  Skin: Skin is warm and dry. No rash noted.  Psychiatric: He has a normal mood and affect.    ED Course  Procedures (including critical care time)  Labs Reviewed - No data to display Dg Chest 2 View  10/21/2012  *RADIOLOGY REPORT*  Clinical Data: Fever and chest.  Cough.  CHEST - 2 VIEW  Comparison: None.  Findings: The heart size is normal.  The lungs are clear.  The visualized soft tissues and bony thorax are unremarkable.  IMPRESSION: Negative two-view chest.   Original Report Authenticated By: San Morelle, M.D.      1. URI (upper respiratory infection)   2. Fever   3. ESRD (end stage renal disease) on dialysis   4. Diabetes       MDM  The patient was reevaluated any discharge. He states he is feeling much better and does not feel sick at all now. He is advised that his chest x-ray was negative no evidence of pneumonia or effusions. He states he no longer feels as though he has a fever, and is not short of breath and feels generally well. He states he primarily came just to see what may have been causing his fever and upper respiratory congestion. And uncertain as to why he received IV antibiotics this morning without antibiotics to go home with. Due to his multiple risk factors and chronic diagnoses this may contribute to a call type of infections. I discussed it with Dr.  Delilah Shan and he suggested that it may be best for him to the emergency Department to finish the workup. When I discussed this with the patient he declined to go stating he did feel better and would rather go home. Advised that he needed to followup with his primary care doctor tomorrow and he said that he would. This was verbally explained to him and he  written on his instructions. He agreed to see his primary care provider tomorrow. For any worsening new symptoms or problems such as chest  pain, shortness of breath, fevers or other problems he is to return or go to the emergency department. He is discharged in stable and improved condition.        Janne Napoleon, NP 10/21/12 2116

## 2012-10-22 ENCOUNTER — Encounter: Payer: Self-pay | Admitting: Internal Medicine

## 2012-10-23 ENCOUNTER — Encounter: Payer: Self-pay | Admitting: Vascular Surgery

## 2012-10-23 ENCOUNTER — Ambulatory Visit (INDEPENDENT_AMBULATORY_CARE_PROVIDER_SITE_OTHER): Payer: Medicare Other | Admitting: Vascular Surgery

## 2012-10-23 ENCOUNTER — Telehealth: Payer: Self-pay

## 2012-10-23 ENCOUNTER — Telehealth: Payer: Self-pay | Admitting: Vascular Surgery

## 2012-10-23 VITALS — BP 133/67 | HR 81 | Resp 18 | Ht 73.0 in | Wt 255.0 lb

## 2012-10-23 DIAGNOSIS — Z992 Dependence on renal dialysis: Secondary | ICD-10-CM | POA: Insufficient documentation

## 2012-10-23 DIAGNOSIS — N186 End stage renal disease: Secondary | ICD-10-CM | POA: Insufficient documentation

## 2012-10-23 NOTE — Telephone Encounter (Signed)
Spoke with wife and friend, finally reached pt at dialysis, pt will be at his appt - kf

## 2012-10-23 NOTE — Telephone Encounter (Signed)
Jerrye Beavers, PA from [facility], reports pt. has fever, positive blood cultures; gram + cocci/ right upper arm AVG site swollen, red, and warm.  Requests vascular surg. Appt.  Discussed w/ Dr. Bridgett Larsson.  Add on to schedule today.  Notified Jerrye Beavers from [facility].

## 2012-10-23 NOTE — Progress Notes (Signed)
[facility]  Established Dialysis Access  History of Present Illness  [name] is a 47 y.o. (1966-03-27) male s/p RUA hybrid AVG Placement (Date: 02/24/12) who presents for evaluation of RUA AVG for infection.  By report, patient with fevers in low 100 with general poor sense of health.  He note a portion of his AVG was swollen after miscannulation.  He denies any drainage.  He has not had steal sx.  He is able to complete his ADLs.  By report, pt is getting abx on HD  Past Medical History, Past Surgical History, Social History, Family History, Medications, Allergies, and Review of Systems are unchanged from previous visit on 04/10/12.  Physical Examination  Filed Vitals:   10/23/12 1313  BP: 133/67  Pulse: 81  Resp: 18  Height: 6\' 1"  (1.854 m)  Weight: 255 lb (115.667 kg)   Body mass index is 33.65 kg/(m^2).  General: A&O x 3, WD, mild obese  Pulmonary: Sym exp, good air movt, CTAB, no rales, rhonchi, & wheezing  Cardiac: RRR, Nl S1, S2, no Murmurs, rubs or gallops  Gastrointestinal: soft, NTND, -G/R, - HSM, - masses, - CVAT B  Musculoskeletal: M/S 5/5 throughout , Extremities without  ischemic changes , palpable thrill in access , palpable bruit in access, some fullness in mid-graft suggestive of past infilitration, no erythema, no fluctuance   Neurologic: Pain and light touch intact in extremities , Motor exam as listed above  Medical Decision Making  [name] is a 47 y.o. male who presents with ESRD requiring hemodialysis, unclear if infected AVG.   I don't have access to the patient's nephrology chart to check if blood cultures have been completed.  The clinical findings are not too impressive for infection.  Given a Gore hybrid graft was placed, this implies this patient is nearing the end of access options in his arms.  Subsequently, an attempt to salvage the AVG should be made with abx even if its infected.  I gave the  patient instructions on what to look for in regards to graft infection.  He is going to follow up in another two weeks to see things are progressing.  At some point, a graft infection will declare itself if its resistant to antibiotics.  Adele Barthel, MD [facility] Office: [phone] Pager: [phone]  10/23/2012, 4:03 PM

## 2012-10-23 NOTE — ED Provider Notes (Signed)
Medical screening examination/treatment/procedure(s) were performed by resident physician or non-physician practitioner and as supervising physician I was immediately available for consultation/collaboration.   Pauline Good MD.   Billy Fischer, MD 10/23/12 [phone]

## 2012-11-05 ENCOUNTER — Encounter: Payer: Self-pay | Admitting: Vascular Surgery

## 2012-11-06 ENCOUNTER — Encounter (HOSPITAL_COMMUNITY): Payer: Self-pay | Admitting: Family Medicine

## 2012-11-06 ENCOUNTER — Ambulatory Visit: Payer: Medicare Other | Admitting: Vascular Surgery

## 2012-11-06 ENCOUNTER — Emergency Department (HOSPITAL_COMMUNITY): Payer: Medicare Other | Admitting: Anesthesiology

## 2012-11-06 ENCOUNTER — Encounter (HOSPITAL_COMMUNITY)
Admission: EM | Disposition: A | Discharge status: Home / Self Care | Payer: Self-pay | Source: Home / Self Care | Attending: Vascular Surgery

## 2012-11-06 ENCOUNTER — Inpatient Hospital Stay (HOSPITAL_COMMUNITY)
Admission: EM | Admit: 2012-11-06 | Discharge: 2012-11-09 | DRG: 264 | Disposition: A | Discharge status: Home / Self Care | Payer: Medicare Other | Attending: Vascular Surgery | Admitting: Vascular Surgery

## 2012-11-06 ENCOUNTER — Encounter (HOSPITAL_COMMUNITY): Payer: Self-pay | Admitting: Anesthesiology

## 2012-11-06 DIAGNOSIS — N186 End stage renal disease: Secondary | ICD-10-CM

## 2012-11-06 DIAGNOSIS — I120 Hypertensive chronic kidney disease with stage 5 chronic kidney disease or end stage renal disease: Secondary | ICD-10-CM | POA: Diagnosis present

## 2012-11-06 DIAGNOSIS — T827XXA Infection and inflammatory reaction due to other cardiac and vascular devices, implants and grafts, initial encounter: Secondary | ICD-10-CM

## 2012-11-06 DIAGNOSIS — E118 Type 2 diabetes mellitus with unspecified complications: Secondary | ICD-10-CM | POA: Diagnosis present

## 2012-11-06 DIAGNOSIS — K219 Gastro-esophageal reflux disease without esophagitis: Secondary | ICD-10-CM | POA: Diagnosis present

## 2012-11-06 DIAGNOSIS — Z992 Dependence on renal dialysis: Secondary | ICD-10-CM

## 2012-11-06 DIAGNOSIS — N2581 Secondary hyperparathyroidism of renal origin: Secondary | ICD-10-CM | POA: Diagnosis present

## 2012-11-06 DIAGNOSIS — Y849 Medical procedure, unspecified as the cause of abnormal reaction of the patient, or of later complication, without mention of misadventure at the time of the procedure: Secondary | ICD-10-CM | POA: Diagnosis present

## 2012-11-06 DIAGNOSIS — D649 Anemia, unspecified: Secondary | ICD-10-CM | POA: Diagnosis present

## 2012-11-06 DIAGNOSIS — I10 Essential (primary) hypertension: Secondary | ICD-10-CM

## 2012-11-06 HISTORY — PX: AV FISTULA PLACEMENT: SHX1204

## 2012-11-06 HISTORY — PX: AVGG REMOVAL: SHX5153

## 2012-11-06 LAB — BASIC METABOLIC PANEL
BUN: 17 mg/dL (ref 6–23)
Calcium: 9.1 mg/dL (ref 8.4–10.5)
Chloride: 95 mEq/L — ABNORMAL LOW (ref 96–112)
Creatinine, Ser: 5.87 mg/dL — ABNORMAL HIGH (ref 0.50–1.35)
GFR calc Af Amer: 12 mL/min — ABNORMAL LOW (ref >90)

## 2012-11-06 LAB — CBC WITH DIFFERENTIAL/PLATELET
Basophils Absolute: 0 10*3/uL (ref 0.0–0.1)
Basophils Relative: 0 % (ref 0–1)
Eosinophils Absolute: 0.2 10*3/uL (ref 0.0–0.7)
Eosinophils Relative: 2 % (ref 0–5)
HCT: 29.9 % — ABNORMAL LOW (ref 39.0–52.0)
Hemoglobin: 9.8 g/dL — ABNORMAL LOW (ref 13.0–17.0)
Lymphocytes Relative: 17 % (ref 12–46)
Lymphs Abs: 1.5 10*3/uL (ref 0.7–4.0)
MCH: 29.7 pg (ref 26.0–34.0)
MCHC: 32.8 g/dL (ref 30.0–36.0)
MCV: 90.6 fL (ref 78.0–100.0)
Monocytes Absolute: 0.8 10*3/uL (ref 0.1–1.0)
Monocytes Relative: 9 % (ref 3–12)
Neutro Abs: 6.1 10*3/uL (ref 1.7–7.7)
Neutrophils Relative %: 72 % (ref 43–77)
Platelets: 92 10*3/uL — ABNORMAL LOW (ref 150–400)
RBC: 3.3 MIL/uL — ABNORMAL LOW (ref 4.22–5.81)
RDW: 13.3 % (ref 11.5–15.5)
WBC: 8.6 10*3/uL (ref 4.0–10.5)

## 2012-11-06 LAB — APTT: aPTT: 21 seconds — ABNORMAL LOW (ref 24–37)

## 2012-11-06 LAB — BASIC METABOLIC PANEL WITH GFR
CO2: 33 meq/L — ABNORMAL HIGH (ref 19–32)
GFR calc non Af Amer: 10 mL/min — ABNORMAL LOW (ref >90)
Glucose, Bld: 117 mg/dL — ABNORMAL HIGH (ref 70–99)
Potassium: 4.1 meq/L (ref 3.5–5.1)
Sodium: 138 meq/L (ref 135–145)

## 2012-11-06 LAB — GLUCOSE, CAPILLARY
Glucose-Capillary: 126 mg/dL — ABNORMAL HIGH (ref 70–99)
Glucose-Capillary: 176 mg/dL — ABNORMAL HIGH (ref 70–99)

## 2012-11-06 LAB — PROTIME-INR
INR: 0.94 (ref 0.00–1.49)
Prothrombin Time: 12.5 s (ref 11.6–15.2)

## 2012-11-06 SURGERY — REMOVAL OF ARTERIOVENOUS GORETEX GRAFT (AVGG)
Anesthesia: General | Site: Arm Upper | Laterality: Right | Wound class: Dirty or Infected

## 2012-11-06 MED ORDER — LIDOCAINE-EPINEPHRINE (PF) 1 %-1:200000 IJ SOLN
INTRAMUSCULAR | Status: AC
  Filled 2012-11-06: qty 10

## 2012-11-06 MED ORDER — HYDROMORPHONE HCL PF 1 MG/ML IJ SOLN
INTRAMUSCULAR | Status: AC
  Administered 2012-11-06: 0.5 mg via INTRAVENOUS
  Filled 2012-11-06: qty 1

## 2012-11-06 MED ORDER — ONDANSETRON HCL 4 MG/2ML IJ SOLN: 4.0000 mg | Freq: Four times a day (QID) | INTRAMUSCULAR | Status: DC | PRN

## 2012-11-06 MED ORDER — SODIUM CHLORIDE 0.9 % IJ SOLN
3.0000 mL | Freq: Two times a day (BID) | INTRAMUSCULAR | Status: DC
  Administered 2012-11-07: 3 mL via INTRAVENOUS

## 2012-11-06 MED ORDER — FENTANYL CITRATE 0.05 MG/ML IJ SOLN
INTRAMUSCULAR | Status: DC | PRN
  Administered 2012-11-06 (×3): 25 ug via INTRAVENOUS
  Administered 2012-11-06: 100 ug via INTRAVENOUS
  Administered 2012-11-06: 25 ug via INTRAVENOUS

## 2012-11-06 MED ORDER — ONDANSETRON HCL 4 MG/2ML IJ SOLN
INTRAMUSCULAR | Status: DC | PRN
  Administered 2012-11-06: 4 mg via INTRAVENOUS

## 2012-11-06 MED ORDER — HYDROMORPHONE HCL PF 1 MG/ML IJ SOLN
0.2500 mg | INTRAMUSCULAR | Status: DC | PRN
  Administered 2012-11-06: 0.5 mg via INTRAVENOUS

## 2012-11-06 MED ORDER — INSULIN ASPART PROT & ASPART (70-30 MIX) 100 UNIT/ML ~~LOC~~ SUSP
40.0000 [IU] | Freq: Every day | SUBCUTANEOUS | Status: DC
  Administered 2012-11-07: 40 [IU] via SUBCUTANEOUS
  Filled 2012-11-06 (×2): qty 10

## 2012-11-06 MED ORDER — SODIUM CHLORIDE 0.9 % IR SOLN
Status: DC | PRN
  Administered 2012-11-06: 17:00:00

## 2012-11-06 MED ORDER — PANTOPRAZOLE SODIUM 40 MG PO TBEC
40.0000 mg | DELAYED_RELEASE_TABLET | Freq: Every day | ORAL | Status: DC
  Filled 2012-11-06 (×2): qty 1

## 2012-11-06 MED ORDER — OXYCODONE-ACETAMINOPHEN 5-325 MG PO TABS
1.0000 | ORAL_TABLET | ORAL | Status: DC | PRN
  Administered 2012-11-07: 2 via ORAL
  Filled 2012-11-06: qty 2

## 2012-11-06 MED ORDER — VANCOMYCIN HCL IN DEXTROSE 1-5 GM/200ML-% IV SOLN
1000.0000 mg | INTRAVENOUS | Status: DC
  Filled 2012-11-06 (×2): qty 200

## 2012-11-06 MED ORDER — OXYCODONE HCL 5 MG PO TABS
ORAL_TABLET | ORAL | Status: AC
  Administered 2012-11-06: 5 mg via ORAL
  Filled 2012-11-06: qty 1

## 2012-11-06 MED ORDER — VANCOMYCIN HCL 10 G IV SOLR
1500.0000 mg | Freq: Once | INTRAVENOUS | Status: AC
  Administered 2012-11-06: 1500 mg via INTRAVENOUS
  Filled 2012-11-06: qty 1500

## 2012-11-06 MED ORDER — PHENOL 1.4 % MT LIQD
1.0000 | OROMUCOSAL | Status: DC | PRN
  Filled 2012-11-06: qty 177

## 2012-11-06 MED ORDER — LIDOCAINE HCL (CARDIAC) 20 MG/ML IV SOLN
INTRAVENOUS | Status: DC | PRN
  Administered 2012-11-06: 80 mg via INTRAVENOUS

## 2012-11-06 MED ORDER — METOPROLOL TARTRATE 1 MG/ML IV SOLN: 2.0000 mg | INTRAVENOUS | Status: DC | PRN

## 2012-11-06 MED ORDER — ONDANSETRON HCL 4 MG/2ML IJ SOLN: 4.0000 mg | Freq: Once | INTRAMUSCULAR | Status: DC | PRN

## 2012-11-06 MED ORDER — CALCIUM ACETATE 667 MG PO CAPS
2668.0000 mg | ORAL_CAPSULE | Freq: Three times a day (TID) | ORAL | Status: DC
  Administered 2012-11-07 – 2012-11-08 (×5): 2668 mg via ORAL
  Filled 2012-11-06 (×12): qty 4

## 2012-11-06 MED ORDER — OXYCODONE HCL 5 MG PO TABS: 5.0000 mg | ORAL_TABLET | Freq: Once | ORAL | Status: AC | PRN

## 2012-11-06 MED ORDER — 0.9 % SODIUM CHLORIDE (POUR BTL) OPTIME
TOPICAL | Status: DC | PRN
  Administered 2012-11-06: 1000 mL

## 2012-11-06 MED ORDER — MORPHINE SULFATE 2 MG/ML IJ SOLN: 2.0000 mg | INTRAMUSCULAR | Status: DC | PRN

## 2012-11-06 MED ORDER — HYDRALAZINE HCL 20 MG/ML IJ SOLN
10.0000 mg | INTRAMUSCULAR | Status: DC | PRN
  Filled 2012-11-06: qty 1

## 2012-11-06 MED ORDER — SODIUM CHLORIDE 0.9 % IV SOLN
INTRAVENOUS | Status: DC | PRN
  Administered 2012-11-06: 16:00:00 via INTRAVENOUS

## 2012-11-06 MED ORDER — ACETAMINOPHEN 650 MG RE SUPP: 325.0000 mg | RECTAL | Status: DC | PRN

## 2012-11-06 MED ORDER — OXYCODONE HCL 5 MG/5ML PO SOLN: 5.0000 mg | Freq: Once | ORAL | Status: AC | PRN

## 2012-11-06 MED ORDER — SODIUM CHLORIDE 0.9 % IV SOLN: 250.0000 mL | INTRAVENOUS | Status: DC | PRN

## 2012-11-06 MED ORDER — GUAIFENESIN-DM 100-10 MG/5ML PO SYRP: 15.0000 mL | ORAL_SOLUTION | ORAL | Status: DC | PRN

## 2012-11-06 MED ORDER — RENA-VITE PO TABS
1.0000 | ORAL_TABLET | Freq: Every day | ORAL | Status: DC
  Filled 2012-11-06 (×5): qty 1

## 2012-11-06 MED ORDER — INSULIN ASPART 100 UNIT/ML ~~LOC~~ SOLN
0.0000 [IU] | SUBCUTANEOUS | Status: DC
  Administered 2012-11-06: 3 [IU] via SUBCUTANEOUS
  Administered 2012-11-07: 8 [IU] via SUBCUTANEOUS
  Administered 2012-11-07: 3 [IU] via SUBCUTANEOUS
  Administered 2012-11-08: 8 [IU] via SUBCUTANEOUS
  Administered 2012-11-08: 3 [IU] via SUBCUTANEOUS
  Administered 2012-11-08: 8 [IU] via SUBCUTANEOUS

## 2012-11-06 MED ORDER — PROPOFOL 10 MG/ML IV BOLUS
INTRAVENOUS | Status: DC | PRN
  Administered 2012-11-06: 50 mg via INTRAVENOUS
  Administered 2012-11-06: 200 mg via INTRAVENOUS
  Administered 2012-11-06: 100 mg via INTRAVENOUS

## 2012-11-06 MED ORDER — LABETALOL HCL 5 MG/ML IV SOLN
10.0000 mg | INTRAVENOUS | Status: DC | PRN
  Filled 2012-11-06: qty 4

## 2012-11-06 MED ORDER — INSULIN ASPART PROT & ASPART (70-30 MIX) 100 UNIT/ML ~~LOC~~ SUSP
50.0000 [IU] | Freq: Every day | SUBCUTANEOUS | Status: DC
  Administered 2012-11-08: 50 [IU] via SUBCUTANEOUS
  Filled 2012-11-06: qty 10

## 2012-11-06 MED ORDER — SODIUM CHLORIDE 0.9 % IJ SOLN: 3.0000 mL | INTRAMUSCULAR | Status: DC | PRN

## 2012-11-06 MED ORDER — ACETAMINOPHEN 325 MG PO TABS: 325.0000 mg | ORAL_TABLET | ORAL | Status: DC | PRN

## 2012-11-06 MED ORDER — INSULIN ASPART PROT & ASPART (70-30 MIX) 100 UNIT/ML ~~LOC~~ SUSP: 40.0000 [IU] | Freq: Two times a day (BID) | SUBCUTANEOUS | Status: DC

## 2012-11-06 MED ORDER — VANCOMYCIN HCL 1000 MG IV SOLR
1000.0000 mg | INTRAVENOUS | Status: DC | PRN
  Administered 2012-11-06: 1000 mg via INTRAVENOUS

## 2012-11-06 MED ORDER — PHENYLEPHRINE HCL 10 MG/ML IJ SOLN
INTRAMUSCULAR | Status: DC | PRN
  Administered 2012-11-06: 120 ug via INTRAVENOUS
  Administered 2012-11-06: 80 ug via INTRAVENOUS
  Administered 2012-11-06: 40 ug via INTRAVENOUS
  Administered 2012-11-06: 80 ug via INTRAVENOUS
  Administered 2012-11-06: 40 ug via INTRAVENOUS

## 2012-11-06 SURGICAL SUPPLY — 47 items
BANDAGE ELASTIC 4 VELCRO ST LF (GAUZE/BANDAGES/DRESSINGS) ×1 IMPLANT
BANDAGE GAUZE ELAST BULKY 4 IN (GAUZE/BANDAGES/DRESSINGS) ×1 IMPLANT
CANISTER SUCTION 2500CC (MISCELLANEOUS) ×2 IMPLANT
CATH EMB 4FR 80CM (CATHETERS) ×1 IMPLANT
CLIP TI MEDIUM 6 (CLIP) ×2 IMPLANT
CLIP TI WIDE RED SMALL 6 (CLIP) ×2 IMPLANT
CLOTH BEACON ORANGE TIMEOUT ST (SAFETY) ×2 IMPLANT
COVER SURGICAL LIGHT HANDLE (MISCELLANEOUS) ×2 IMPLANT
DECANTER SPIKE VIAL GLASS SM (MISCELLANEOUS) IMPLANT
DRAPE INCISE IOBAN 66X45 STRL (DRAPES) ×1 IMPLANT
DRESSING OPSITE X SMALL 2X3 (GAUZE/BANDAGES/DRESSINGS) ×1 IMPLANT
ELECT REM PT RETURN 9FT ADLT (ELECTROSURGICAL) ×2
ELECTRODE REM PT RTRN 9FT ADLT (ELECTROSURGICAL) ×1 IMPLANT
GEL ULTRASOUND 20GR AQUASONIC (MISCELLANEOUS) ×2 IMPLANT
GLOVE BIO SURGEON STRL SZ 6.5 (GLOVE) ×1 IMPLANT
GLOVE BIOGEL PI IND STRL 6.5 (GLOVE) IMPLANT
GLOVE BIOGEL PI IND STRL 7.0 (GLOVE) IMPLANT
GLOVE BIOGEL PI INDICATOR 6.5 (GLOVE) ×1
GLOVE BIOGEL PI INDICATOR 7.0 (GLOVE) ×1
GLOVE ECLIPSE 6.5 STRL STRAW (GLOVE) ×1 IMPLANT
GLOVE SS BIOGEL STRL SZ 7 (GLOVE) ×1 IMPLANT
GLOVE SUPERSENSE BIOGEL SZ 7 (GLOVE) ×1
GLOVE SURG SS PI 7.0 STRL IVOR (GLOVE) ×1 IMPLANT
GOWN STRL NON-REIN LRG LVL3 (GOWN DISPOSABLE) ×5 IMPLANT
GOWN STRL REIN XL XLG (GOWN DISPOSABLE) ×1 IMPLANT
GRAFT GORETEX STND 6X20 (Vascular Products) ×2 IMPLANT
GRAFT GORETEXSTD 6X20 (Vascular Products) IMPLANT
KIT BASIN OR (CUSTOM PROCEDURE TRAY) ×2 IMPLANT
KIT ROOM TURNOVER OR (KITS) ×2 IMPLANT
NS IRRIG 1000ML POUR BTL (IV SOLUTION) ×2 IMPLANT
PACK CV ACCESS (CUSTOM PROCEDURE TRAY) ×2 IMPLANT
PAD ARMBOARD 7.5X6 YLW CONV (MISCELLANEOUS) ×4 IMPLANT
SPONGE GAUZE 4X4 12PLY (GAUZE/BANDAGES/DRESSINGS) ×2 IMPLANT
SUT PROLENE 5 0 C 1 24 (SUTURE) ×2 IMPLANT
SUT PROLENE 6 0 BV (SUTURE) ×2 IMPLANT
SUT SILK 2 0 SH (SUTURE) ×1 IMPLANT
SUT VIC AB 3-0 SH 27 (SUTURE) ×8
SUT VIC AB 3-0 SH 27X BRD (SUTURE) ×2 IMPLANT
SUT VIC AB 3-0 SH 8-18 (SUTURE) ×1 IMPLANT
SUT VICRYL 4-0 PS2 18IN ABS (SUTURE) ×1 IMPLANT
SWAB COLLECTION DEVICE MRSA (MISCELLANEOUS) ×1 IMPLANT
SYR 20CC LL (SYRINGE) ×1 IMPLANT
TOWEL OR 17X24 6PK STRL BLUE (TOWEL DISPOSABLE) ×2 IMPLANT
TOWEL OR 17X26 10 PK STRL BLUE (TOWEL DISPOSABLE) ×2 IMPLANT
TUBE ANAEROBIC SPECIMEN COL (MISCELLANEOUS) ×1 IMPLANT
UNDERPAD 30X30 INCONTINENT (UNDERPADS AND DIAPERS) ×2 IMPLANT
WATER STERILE IRR 1000ML POUR (IV SOLUTION) ×2 IMPLANT

## 2012-11-06 NOTE — H&P (Signed)
Vascular Surgery H&P  Chief Complaint: Infected right upper arm AVG  HPI: [name] is a 47 y.o. male who presents for evaluation of infected right upper arm AV graft. Patient had hybrid AV graft inserted by Dr. Lurline Hare and August 2013. It has been followed in the office by Dr. Clair Gulling recently with fluid around the graft. Patient was to return today to see Dr. Bridgett Larsson the office but because of increasing pain and swelling over the graft at dialysis he came to the emergency department. He has no history of recent chills or fever. The graft has been functioning well.   Past Medical History  Diagnosis Date  . Diabetes mellitus   . Hypertension   . Chest pain   . Morbid obesity   . Bronchitis   . Blood transfusion   . H/O hiatal hernia   . Chronic kidney disease     d[facility], [facility]  . GERD (gastroesophageal reflux disease)   . Dysrhythmia     irregular heartbeat  . Hyperparathyroidism   . ESRD (end stage renal disease) on dialysis     first HD 06/19/04   Past Surgical History  Procedure Laterality Date  . Av fistula placement  10/11/2010  . Ankle fusion      rt foot charcot joint 2008 with refusion  . Av fistula placement  07/02/2011    Procedure: INSERTION OF ARTERIOVENOUS (AV) GORE-TEX GRAFT ARM;  Surgeon: Elam Dutch, MD;  Location: [place];  Service: Vascular;  Laterality: Left;  . Thrombectomy    . Insertion of dialysis catheter  01/20/2012    Procedure: INSERTION OF DIALYSIS CATHETER;  Surgeon: Serafina Mitchell, MD;  Location: [place];  Service: Vascular;  Laterality: N/A;  insertion left internal jugular dialysis catheter 23cm  . Umbilcial hernia    . Parathyroidectomy     History   Social History  . Marital Status: Legally Separated    Spouse Name: N/A    Number of Children: N/A  . Years of Education: N/A   Social History Main Topics  . Smoking status: Current Some Day Smoker -- 0.10 packs/day for 20 years    Types: Cigarettes  . Smokeless tobacco: Never  Used     Comment: pt states he only smokes about 3 cigs per week  . Alcohol Use: No  . Drug Use: No  . Sexually Active: None   Other Topics Concern  . None   Social History Narrative  . None   Family History  Problem Relation Age of Onset  . Heart disease Mother   . Kidney disease Mother    No Known Allergies Prior to Admission medications   Medication Sig Start Date End Date Taking? Authorizing Provider  acetaminophen (TYLENOL) 500 MG tablet Take 1,000 mg by mouth every 6 (six) hours as needed for pain.   Yes Historical Provider, MD  b complex-vitamin c-folic acid (NEPHRO-VITE) 0.8 MG TABS Take 0.8 mg by mouth at bedtime.     Yes Historical Provider, MD  calcium acetate (PHOSLO) 667 MG capsule Take 2,668 mg by mouth 3 (three) times daily with meals.   Yes Historical Provider, MD  insulin aspart protamine-insulin aspart (NOVOLOG 70/30) (70-30) 100 UNIT/ML injection Inject 40-50 Units into the skin 2 (two) times daily with a meal. Take 40 units in the morning and 50 units in the evening   Yes Historical Provider, MD  OVER THE COUNTER MEDICATION Take 1 tablet by mouth daily as needed (allergies.). Allergy Relief.  Yes Historical Provider, MD  oxyCODONE-acetaminophen (PERCOCET/ROXICET) 5-325 MG per tablet Take 1 tablet by mouth every 8 (eight) hours as needed for pain.  02/13/12  Yes Historical Provider, MD     Positive ROS: Denies chest pain, dyspnea on exertion, PND, orthopnea  All other systems have been reviewed and were otherwise negative with the exception of those mentioned in the HPI and as above.  Physical Exam: Filed Vitals:   11/06/12 1207  BP: 181/82  Pulse: 111  Temp: 97.9 F (36.6 C)  Resp: 21    General: Alert, no acute distress HEENT: Normal for age Cardiovascular: Regular rate and rhythm. Carotid pulses 2+, no bruits audible Respiratory: Clear to auscultation. No cyanosis, no use of accessory musculature GI: No organomegaly, abdomen is soft and  non-tender Skin: No lesions in the area of chief complaint Neurologic: Sensation intact distally Psychiatric: Patient is competent for consent with normal mood and affect Musculoskeletal: No obvious deformities-right leg splint in place Extremities: Right upper extremity with AV graft with pulse and palpable thrill. There is significant fluid around the graft at the 9 to 10:00 position. This graft originates in the distal brachial artery and extends to the axillary vein and as a hybrid graft. There is a stent in the axillary vein.      Assessment/Plan:  The graft appears to definitely be infected with significant fluid and erythema overlying a localized portion of the graft. Because the patient has such limited access we will attempt to salvage the graft by tunneling proximal and distal around this area and then removing the infected portion. Patient understands that this may not be successful and  May eventually need to be removed. Patient is in agreement with the plan and would like to proceed   [name], MD 11/06/2012 4:16 PM

## 2012-11-06 NOTE — ED Notes (Signed)
Vascular surgeon at bedside. Lab tech is drawing labs. IV team is on the way to start an IV. This RN called the OR to update the nurse there.

## 2012-11-06 NOTE — ED Notes (Signed)
Pt went to his nephrologist, Dr. Bridgett Larsson, and was told to come to the ER because the graft in his right, upper arm is infected. The pt reports having dialysis today for his schedule time.

## 2012-11-06 NOTE — ED Notes (Signed)
This RN notified the OR staff, that the pt will be upstairs presently.

## 2012-11-06 NOTE — ED Notes (Signed)
IV team cancelled.

## 2012-11-06 NOTE — Anesthesia Preprocedure Evaluation (Addendum)
Anesthesia Evaluation  Patient identified by MRN, date of birth, ID band Patient awake    Reviewed: Allergy & Precautions, H&P , NPO status , Patient's Chart, lab work & pertinent test results  Airway Mallampati: I TM Distance: >3 FB Neck ROM: Full    Dental  (+) Teeth Intact, Dental Advisory Given and Missing   Pulmonary  breath sounds clear to auscultation        Cardiovascular hypertension, Pt. on medications + Peripheral Vascular Disease Rhythm:Regular Rate:Normal     Neuro/Psych    GI/Hepatic GERD-  ,  Endo/Other  diabetes, Well Controlled, Type 2, Insulin DependentMorbid obesity  Renal/GU DialysisRenal disease     Musculoskeletal   Abdominal   Peds  Hematology   Anesthesia Other Findings   Reproductive/Obstetrics                         Anesthesia Physical Anesthesia Plan  ASA: III  Anesthesia Plan: General   Post-op Pain Management:    Induction: Intravenous  Airway Management Planned: LMA  Additional Equipment:   Intra-op Plan:   Post-operative Plan: Extubation in OR  Informed Consent: I have reviewed the patients History and Physical, chart, labs and discussed the procedure including the risks, benefits and alternatives for the proposed anesthesia with the patient or authorized representative who has indicated his/her understanding and acceptance.   Dental advisory given  Plan Discussed with: CRNA, Anesthesiologist and Surgeon  Anesthesia Plan Comments:         Anesthesia Quick Evaluation

## 2012-11-06 NOTE — Op Note (Signed)
OPERATIVE REPORT  Date of Surgery: 11/06/2012  Surgeon: [name], MD  Assistant: Nurse  Pre-op Diagnosis: infected av graft-right upper arm Post-op Diagnosis: Same Procedure: Procedure(s): #1 replacement of right upper arm AV Gore-Tex graft 2 more peripheral location using 6 mm Gore-Tex #2 removal of infected segment of existing AV Gore-Tex graft with debridement of skin  Anesthesia: MAC  EBL: Minimal  Complications: None  Procedure Details: Patient was taken to the operating room placed in supine position at which time the right upper trembly was prepped with Betadine scrub and solution draped in routine sterile manner after induction of satisfactory general endotracheal anesthesia. There was a right upper arm AV Gore-Tex graft which had a very localized abscess at the 9 and 10:00 position which was fluctuant. He there was a small opening with some bloody purulent drainage which was localized and isolated with a small 4 x 4 gauze and adhesive drape. The existing graft was from the distal brachial artery to the axillary vein. There is no evidence of infection near the brachial artery anastomosis and near the axillary vein anastomosis. Therefore short incisions were made in these areas and the graft was dissected free. A new graft was then tunneled peripheral to the old graft staying away from the infected area and a 6 mm 20 cm Gore-Tex graft delivered through this new tunnel. No heparin was given. Graft was transected at both ends after clamping it proximally and distally with a vascular clamp an end-to-end anastomoses was done with a 6-0 Prolene at both areas. 4 Fogarty catheter was passed around the arterial end as well as up into the axillary vein or a stent had been previously placed. There was excellent venous backbleeding a Fogarty would traverse this without difficulty. Following completion of the anastomosis was excellent Doppler flow in the graft. Adequate hemostasis was achieved and  the wounds were both closed in layers with Vicryl in subcuticular fashion. The other end of the graft where it was transected was oversewn with 5-0 Prolene in 2 layers in both areas. Sterile OpSite were then placed over these new sterile wounds. Attention was then turned to the fluctuant area in the adhesive drape was removed to expose this. A longitudinal opening was made overlying the graft from the 10:00 to the 8:00 position. The graft was exposed in the depth of this abscess cavity. Aerobic and aerobic cultures were sent as well as graft itself culture. Graft was then dissected free of underneath the skin bridge where it was very adherent uninfected. It was transected in both areas and the tunnel over sewn as well as possible with 2 interrupted 3-0 Vicryl sutures on both sides to try to prevent further soilage of this residual segments of graft and also not infected the new graft. Following this the wound was slightly debrided and packed open with moist saline gauze 4 x 4's and an Ace wrap patient taken to recovery in stable condition   [name], MD 11/06/2012 6:36 PM

## 2012-11-06 NOTE — Transfer of Care (Signed)
Immediate Anesthesia Transfer of Care Note  Patient: [name]  Procedure(s) Performed: Procedure(s): REMOVAL OF ARTERIOVENOUS GORETEX GRAFT ([place]) (Right) INSERTION OF ARTERIOVENOUS (AV) GORE-TEX GRAFT ARM (Right)  Patient Location: PACU  Anesthesia Type:General  Level of Consciousness: awake, alert , oriented and patient cooperative  Airway & Oxygen Therapy: Patient Spontanous Breathing and Patient connected to face mask oxygen  Post-op Assessment: Report given to PACU RN, Post -op Vital signs reviewed and stable and Patient moving all extremities  Post vital signs: Reviewed and stable  Complications: No apparent anesthesia complications

## 2012-11-06 NOTE — Progress Notes (Signed)
ANTIBIOTIC CONSULT NOTE - INITIAL  Pharmacy Consult for Vancomycin Indication: AVF infection  No Known Allergies  Patient Measurements: Height: 6' 0.83" (185 cm) Weight: 255 lb 1.2 oz (115.7 kg) IBW/kg (Calculated) : 79.52  Vital Signs: Temp: 98.8 F (37.1 C) (04/25 2030) Temp src: Oral (04/25 1207) BP: 150/78 mmHg (04/25 2041) Pulse Rate: 96 (04/25 2041) Intake/Output from previous day:   Intake/Output from this shift:    Labs:  Recent Labs  11/06/12 1407  WBC 8.6  HGB 9.8*  PLT 92*  CREATININE 5.87*   Estimated Creatinine Clearance: 20.2 ml/min (by C-G formula based on Cr of 5.87). No results found for this basename: VANCOTROUGH, VANCOPEAK, VANCORANDOM, GENTTROUGH, GENTPEAK, GENTRANDOM, TOBRATROUGH, TOBRAPEAK, TOBRARND, AMIKACINPEAK, AMIKACINTROU, AMIKACIN,  in the last 72 hours   Microbiology: Recent Results (from the past 720 hour(s))  WOUND CULTURE     Status: None   Collection Time    11/06/12  6:20 PM      Result Value Range Status   Specimen Description WOUND EXIT SITE ARM RIGHT   Final   Special Requests     Final   Value: PATIENT ON FOLLOWING VANCOMYCIN INFECTED AV GRAFT SWAB   Gram Stain PENDING   Incomplete   Culture PENDING   Incomplete   Report Status PENDING   Incomplete  ANAEROBIC CULTURE     Status: None   Collection Time    11/06/12  6:20 PM      Result Value Range Status   Specimen Description WOUND EXIT SITE ARM RIGHT   Final   Special Requests     Final   Value: PATIENT ON FOLLOWING VANCOMYCIN INFECTED AV GRAFT SWAB   Gram Stain PENDING   Incomplete   Culture PENDING   Incomplete   Report Status PENDING   Incomplete    Medical History: Past Medical History  Diagnosis Date  . Diabetes mellitus   . Hypertension   . Chest pain   . Morbid obesity   . Bronchitis   . Blood transfusion   . H/O hiatal hernia   . Chronic kidney disease     d[facility], [facility]  . GERD (gastroesophageal reflux disease)   . Dysrhythmia    irregular heartbeat  . Hyperparathyroidism   . ESRD (end stage renal disease) on dialysis     first HD 06/19/04    Assessment: 47 y.o M with ESRD to start Vancomycin for empiric coverage of AVG infection -- now s/p I&D and replacement. The patient already received Vancomycin 1g around 1700 during the procedure. Will complete load and plan to schedule maintenance dosing post-HD sessions.  HD-MWF, Wt 115.7 kg.   Goal of Therapy:  Pre-HD Vancomycin level of 15-25 mcg/ml  Plan:  1. Vancomycin 1500 mg IV x 1 dose (to complete load) 2. Vancomycin 1g post HD sessions on MWF 3. Will continue to follow HD schedule/duration, culture results, LOT, and antibiotic de-escalation plans   Alycia Rossetti, PharmD, BCPS Clinical Pharmacist Pager: [phone] 11/06/2012 9:34 PM

## 2012-11-06 NOTE — Anesthesia Postprocedure Evaluation (Signed)
  Anesthesia Post-op Note  Patient: [name]  Procedure(s) Performed: Procedure(s): REMOVAL OF ARTERIOVENOUS GORETEX GRAFT ([place]) (Right) INSERTION OF ARTERIOVENOUS (AV) GORE-TEX GRAFT ARM (Right)  Patient Location: PACU  Anesthesia Type:General  Level of Consciousness: awake  Airway and Oxygen Therapy: Patient Spontanous Breathing  Post-op Pain: mild  Post-op Assessment: Post-op Vital signs reviewed  Post-op Vital Signs: Reviewed  Complications: No apparent anesthesia complications

## 2012-11-06 NOTE — ED Provider Notes (Signed)
History     CSN: [id number]  Arrival date & time 11/06/12  1201   First MD Initiated Contact with Patient 11/06/12 1302      Chief Complaint  Patient presents with  . graft infection     (Consider location/radiation/quality/duration/timing/severity/associated sxs/prior treatment) HPI Comments: Patient with history of end-stage renal disease on dialysis, prior history of hypertension and currently diabetic on insulin reports that he has been getting dialysis as usual through his right upper extremity graft. He reports a graft was placed by Dr. Bridgett Larsson about 8-10 months ago. She reports approximately 2 weeks ago there was some increase in swelling, redness with appearance of infection. This was seen by Dr. Bridgett Larsson 2 weeks ago, they agreed to try intravenous antibiotics with dialysis to see if this would clear his infection. He actually had a scheduled appointment for recheck with Dr. Bridgett Larsson later today at 2 PM. However he was told that if symptoms got much worse, he should present himself to the emergency department. He underwent dialysis as usual today, they were able to use his graft without difficulty. However in the last hour or so, there's been a sudden worsening in redness, swelling and discomfort to the top portion of his graft. He denies fever or chills, he attributes to not feeling feverish due to the antibiotics that they have been giving him. However he is certainly concerned because the infection continues to appear to keep returning.  The history is provided by the patient and medical records.    Past Medical History  Diagnosis Date  . Diabetes mellitus   . Hypertension   . Chest pain   . Morbid obesity   . Bronchitis   . Blood transfusion   . H/O hiatal hernia   . Chronic kidney disease     dialysis, [facility]  . GERD (gastroesophageal reflux disease)   . Dysrhythmia     irregular heartbeat  . Hyperparathyroidism   . ESRD (end stage renal disease) on dialysis     first  HD 06/19/04    Past Surgical History  Procedure Laterality Date  . Av fistula placement  10/11/2010  . Ankle fusion      rt foot charcot joint 2008 with refusion  . Av fistula placement  07/02/2011    Procedure: INSERTION OF ARTERIOVENOUS (AV) GORE-TEX GRAFT ARM;  Surgeon: Elam Dutch, MD;  Location: [facility];  Service: Vascular;  Laterality: Left;  . Thrombectomy    . Insertion of dialysis catheter  01/20/2012    Procedure: INSERTION OF DIALYSIS CATHETER;  Surgeon: Serafina Mitchell, MD;  Location: [place];  Service: Vascular;  Laterality: N/A;  insertion left internal jugular dialysis catheter 23cm  . Umbilcial hernia    . Parathyroidectomy      Family History  Problem Relation Age of Onset  . Heart disease Mother   . Kidney disease Mother     History  Substance Use Topics  . Smoking status: Current Some Day Smoker -- 0.10 packs/day for 20 years    Types: Cigarettes  . Smokeless tobacco: Never Used     Comment: pt states he only smokes about 3 cigs per week  . Alcohol Use: No      Review of Systems  Constitutional: Negative for fever and chills.  Respiratory: Negative for shortness of breath.   Gastrointestinal: Negative for nausea, vomiting and abdominal pain.  Musculoskeletal: Positive for arthralgias.  Skin: Positive for color change. Negative for rash and wound.  Neurological: Negative  for headaches.  All other systems reviewed and are negative.    Allergies  Review of patient's allergies indicates no known allergies.  Home Medications   Current Outpatient Rx  Name  Route  Sig  Dispense  Refill  . acetaminophen (TYLENOL) 500 MG tablet   Oral   Take 1,000 mg by mouth every 6 (six) hours as needed for pain.         Marland Kitchen b complex-vitamin c-folic acid (NEPHRO-VITE) 0.8 MG TABS   Oral   Take 0.8 mg by mouth at bedtime.           . calcium acetate (PHOSLO) 667 MG capsule   Oral   Take 2,668 mg by mouth 3 (three) times daily with meals.         . insulin  aspart protamine-insulin aspart (NOVOLOG 70/30) (70-30) 100 UNIT/ML injection   Subcutaneous   Inject 40-50 Units into the skin 2 (two) times daily with a meal. Take 40 units in the morning and 50 units in the evening         . OVER THE COUNTER MEDICATION   Oral   Take 1 tablet by mouth daily as needed (allergies.). Allergy Relief.         Marland Kitchen oxyCODONE-acetaminophen (PERCOCET/ROXICET) 5-325 MG per tablet   Oral   Take 1 tablet by mouth every 8 (eight) hours as needed for pain.            BP 181/82  Pulse 111  Temp(Src) 97.9 F (36.6 C) (Oral)  Resp 21  SpO2 95%  Physical Exam  Nursing note and vitals reviewed. Constitutional: He appears well-developed and well-nourished. No distress.  HENT:  Head: Normocephalic and atraumatic.  Eyes: EOM are normal. No scleral icterus.  Neck: Normal range of motion. Neck supple.  Cardiovascular: Normal rate, regular rhythm and intact distal pulses.   Pulmonary/Chest: Effort normal. No respiratory distress. He has no wheezes.  Abdominal: Soft. He exhibits no distension. There is no tenderness.  Musculoskeletal: He exhibits tenderness.  Thrill is palpable. Skin overlying his graft is very erythematous, hot to touch, areas of small fluctuance without overt drainage of any paralytic discharge. No satellite lesions. The area is tender full range of motion of right shoulder and right elbow. Normal distal gross sensation as well as distal strength in right upper extremity.  Neurological: He is alert. No cranial nerve deficit. Coordination normal.  Skin: Skin is warm. No rash noted. There is erythema.  Psychiatric: He has a normal mood and affect.    ED Course  Procedures (including critical care time)  Labs Reviewed  CBC WITH DIFFERENTIAL - Abnormal; Notable for the following:    RBC 3.30 (*)    Hemoglobin 9.8 (*)    HCT 29.9 (*)    Platelets 92 (*)    All other components within normal limits  BASIC METABOLIC PANEL - Abnormal; Notable  for the following:    Chloride 95 (*)    CO2 33 (*)    Glucose, Bld 117 (*)    Creatinine, Ser 5.87 (*)    GFR calc non Af Amer 10 (*)    GFR calc Af Amer 12 (*)    All other components within normal limits  CULTURE, BLOOD (ROUTINE X 2)  CULTURE, BLOOD (ROUTINE X 2)  APTT  PROTIME-INR   No results found.   1. Infection of AV graft for dialysis, initial encounter   2. Hypertension   3. End stage renal disease on dialysis  Room-air saturation is 95% I interpret this to be normal  3:13 PM I spoke to Dr. Bridgett Larsson who is in office, cannot get out of seeing patients, asks that I contact Dr. Kellie Simmering to see pt, recommends getting routine pre op labs and also 2 blood cultures which I have ordered.  Will consult with Dr. Kellie Simmering.     Dr. Kellie Simmering to see pt and likely admit.  MDM   Patient with likely graft infection, localized. Antibiotics likely has prevented him from becoming more septic, however infection seems to be recurring and not improving. Patient does not appear septic. He has mild tachycardia noted, is hypertensive. He has normal mentation, no nausea or vomiting. No other obvious source of fever. Plan is to discuss with vascular surgeon on call.        Saddie Benders. Jermari Tamargo, MD 11/09/12 1544

## 2012-11-06 NOTE — Anesthesia Procedure Notes (Signed)
Procedure Name: LMA Insertion Date/Time: 11/06/2012 5:01 PM Performed by: Julian Reil Pre-anesthesia Checklist: Patient identified, Emergency Drugs available, Suction available and Patient being monitored Patient Re-evaluated:Patient Re-evaluated prior to inductionOxygen Delivery Method: Circle system utilized Preoxygenation: Pre-oxygenation with 100% oxygen Intubation Type: IV induction LMA: LMA inserted LMA Size: 5.0 Tube type: Oral Number of attempts: 1 Placement Confirmation: positive ETCO2 and breath sounds checked- equal and bilateral Tube secured with: Tape Dental Injury: Teeth and Oropharynx as per pre-operative assessment

## 2012-11-06 NOTE — ED Notes (Signed)
Transported patient to OR patient items placed in belongings bag.

## 2012-11-06 NOTE — Preoperative (Signed)
Beta Blockers   Reason not to administer Beta Blockers:Not Applicable 

## 2012-11-06 NOTE — ED Notes (Signed)
Per pt dialysis graft has been infected x 2 weeks. sts he has been on abx and has been doing his dialysis but not getting any better.

## 2012-11-07 ENCOUNTER — Encounter (HOSPITAL_COMMUNITY): Payer: Self-pay | Admitting: *Deleted

## 2012-11-07 LAB — COMPREHENSIVE METABOLIC PANEL
Albumin: 3.2 g/dL — ABNORMAL LOW (ref 3.5–5.2)
BUN: 21 mg/dL (ref 6–23)
Calcium: 8.7 mg/dL (ref 8.4–10.5)
Creatinine, Ser: 7.05 mg/dL — ABNORMAL HIGH (ref 0.50–1.35)
GFR calc Af Amer: 9 mL/min — ABNORMAL LOW (ref >90)
Glucose, Bld: 249 mg/dL — ABNORMAL HIGH (ref 70–99)
Total Protein: 7.6 g/dL (ref 6.0–8.3)

## 2012-11-07 LAB — GLUCOSE, CAPILLARY
Glucose-Capillary: 171 mg/dL — ABNORMAL HIGH (ref 70–99)
Glucose-Capillary: 124 mg/dL — ABNORMAL HIGH (ref 70–99)
Glucose-Capillary: 165 mg/dL — ABNORMAL HIGH (ref 70–99)

## 2012-11-07 LAB — CBC
HCT: 28.1 % — ABNORMAL LOW (ref 39.0–52.0)
Hemoglobin: 9.1 g/dL — ABNORMAL LOW (ref 13.0–17.0)
MCH: 29.6 pg (ref 26.0–34.0)
MCHC: 32.4 g/dL (ref 30.0–36.0)
MCV: 91.5 fL (ref 78.0–100.0)
RDW: 13.5 % (ref 11.5–15.5)

## 2012-11-07 NOTE — Consult Note (Signed)
[facility] Renal Consultation Note  Indication for Consultation:  Management of ESRD/hemodialysis; anemia, hypertension/volume and secondary hyperparathyroidism  HPI: [name] is a 47 y.o. male with ESRD on dialysis on MWF at the [facility] who presented to the ED yesterday for evaluation of an infected right upper arm AV graft and subsequently required replacement of the infected segment by Dr. Kellie Simmering of [facility] with plans for dialysis catheter placement on 4/28 before in-hospital dialysis and likely discharge.  He was first noted to have chills during dialysis on 4/9, at which time he began empirical IV antibiotics with blood cultures were eventually positive for MRSA.  He has received Vancomycin per dialysis since 4/9.  Currently his pain is controlled, and he has no complaints.  Dialysis Orders: Center: [facility] on MWF. EDW 123 kg  HD Bath 2K/2.5Ca   Time 4 1/2 hrs  Heparin 8000-U bolus, 2000 U mid-HD. Access AVG @ RUA   BFR 450 DFR 800  Hectorol 0 mcg IV/HD Epogen 2600 Units IV/HD  Venofer 0.   Past Medical History  Diagnosis Date  . Diabetes mellitus   . Hypertension   . Chest pain   . Morbid obesity   . Bronchitis   . Blood transfusion   . H/O hiatal hernia   . Chronic kidney disease     dialysis, [facility]W-F, [facility]  . GERD (gastroesophageal reflux disease)   . Dysrhythmia     irregular heartbeat  . Hyperparathyroidism   . ESRD (end stage renal disease) on dialysis     first HD 06/19/04   Past Surgical History  Procedure Laterality Date  . Av fistula placement  10/11/2010  . Ankle fusion      rt foot charcot joint 2008 with refusion  . Av fistula placement  07/02/2011    Procedure: INSERTION OF ARTERIOVENOUS (AV) GORE-TEX GRAFT ARM;  Surgeon: Elam Dutch, MD;  Location: [facility];  Service: Vascular;  Laterality: Left;  . Thrombectomy    . Insertion of dialysis catheter  01/20/2012    Procedure: INSERTION OF DIALYSIS CATHETER;  Surgeon: Serafina Mitchell, MD;  Location: [facility];  Service: Vascular;  Laterality: N/A;  insertion left internal jugular dialysis catheter 23cm  . Umbilcial hernia    . Parathyroidectomy     Family History  Problem Relation Age of Onset  . Heart disease Mother   . Kidney disease Mother    Social History He still smokes a few cigarettes a week and previously used no more than one pack a week.  He denies any alcohol or illicit drug use.  No Known Allergies Prior to Admission medications   Medication Sig Start Date End Date Taking? Authorizing Provider  acetaminophen (TYLENOL) 500 MG tablet Take 1,000 mg by mouth every 6 (six) hours as needed for pain.   Yes Historical Provider, MD  b complex-vitamin c-folic acid (NEPHRO-VITE) 0.8 MG TABS Take 0.8 mg by mouth at bedtime.     Yes Historical Provider, MD  calcium acetate (PHOSLO) 667 MG capsule Take 2,668 mg by mouth 3 (three) times daily with meals.   Yes Historical Provider, MD  insulin aspart protamine-insulin aspart (NOVOLOG 70/30) (70-30) 100 UNIT/ML injection Inject 40-50 Units into the skin 2 (two) times daily with a meal. Take 40 units in the morning and 50 units in the evening   Yes Historical Provider, MD  OVER THE COUNTER MEDICATION Take 1 tablet by mouth daily as needed (allergies.). Allergy Relief.  Yes Historical Provider, MD  oxyCODONE-acetaminophen (PERCOCET/ROXICET) 5-325 MG per tablet Take 1 tablet by mouth every 8 (eight) hours as needed for pain.  02/13/12  Yes Historical Provider, MD   Labs:  Results for orders placed during the hospital encounter of 11/06/12 (from the past 48 hour(s))  CBC WITH DIFFERENTIAL     Status: Abnormal   Collection Time    11/06/12  2:07 PM      Result Value Range   WBC 8.6  4.0 - 10.5 K/uL   RBC 3.30 (*) 4.22 - 5.81 MIL/uL   Hemoglobin 9.8 (*) 13.0 - 17.0 g/dL   HCT 29.9 (*) 39.0 - 52.0 %   MCV 90.6  78.0 - 100.0 fL   MCH 29.7  26.0 - 34.0 pg   MCHC 32.8  30.0 - 36.0 g/dL   RDW 13.3  11.5 - 15.5 %    Platelets 92 (*) 150 - 400 K/uL   Comment: PLATELET COUNT CONFIRMED BY SMEAR   Neutrophils Relative 72  43 - 77 %   Lymphocytes Relative 17  12 - 46 %   Monocytes Relative 9  3 - 12 %   Eosinophils Relative 2  0 - 5 %   Basophils Relative 0  0 - 1 %   Neutro Abs 6.1  1.7 - 7.7 K/uL   Lymphs Abs 1.5  0.7 - 4.0 K/uL   Monocytes Absolute 0.8  0.1 - 1.0 K/uL   Eosinophils Absolute 0.2  0.0 - 0.7 K/uL   Basophils Absolute 0.0  0.0 - 0.1 K/uL   Smear Review MORPHOLOGY UNREMARKABLE    BASIC METABOLIC PANEL     Status: Abnormal   Collection Time    11/06/12  2:07 PM      Result Value Range   Sodium 138  135 - 145 mEq/L   Potassium 4.1  3.5 - 5.1 mEq/L   Chloride 95 (*) 96 - 112 mEq/L   CO2 33 (*) 19 - 32 mEq/L   Glucose, Bld 117 (*) 70 - 99 mg/dL   BUN 17  6 - 23 mg/dL   Creatinine, Ser 5.87 (*) 0.50 - 1.35 mg/dL   Calcium 9.1  8.4 - 10.5 mg/dL   GFR calc non Af Amer 10 (*) >90 mL/min   GFR calc Af Amer 12 (*) >90 mL/min   Comment:            The eGFR has been calculated     using the CKD EPI equation.     This calculation has not been     validated in all clinical     situations.     eGFR's persistently     <90 mL/min signify     possible Chronic Kidney Disease.  APTT     Status: Abnormal   Collection Time    11/06/12  3:14 PM      Result Value Range   aPTT 21 (*) 24 - 37 seconds  PROTIME-INR     Status: None   Collection Time    11/06/12  3:14 PM      Result Value Range   Prothrombin Time 12.5  11.6 - 15.2 seconds   INR 0.94  0.00 - 1.49  CULTURE, BLOOD (ROUTINE X 2)     Status: None   Collection Time    11/06/12  3:30 PM      Result Value Range   Specimen Description BLOOD ARM LEFT     Special Requests BOTTLES DRAWN AEROBIC  ONLY 10CC     Culture  Setup Time 11/06/2012 23:53     Culture       Value:        BLOOD CULTURE RECEIVED NO GROWTH TO DATE CULTURE WILL BE HELD FOR 5 DAYS BEFORE ISSUING A FINAL NEGATIVE REPORT   Report Status PENDING    CULTURE, BLOOD (ROUTINE  X 2)     Status: None   Collection Time    11/06/12  4:00 PM      Result Value Range   Specimen Description BLOOD ARM LEFT     Special Requests BOTTLES DRAWN AEROBIC ONLY 6CC     Culture  Setup Time 11/06/2012 23:56     Culture       Value:        BLOOD CULTURE RECEIVED NO GROWTH TO DATE CULTURE WILL BE HELD FOR 5 DAYS BEFORE ISSUING A FINAL NEGATIVE REPORT   Report Status PENDING    GLUCOSE, CAPILLARY     Status: Abnormal   Collection Time    11/06/12  4:30 PM      Result Value Range   Glucose-Capillary 103 (*) 70 - 99 mg/dL  WOUND CULTURE     Status: None   Collection Time    11/06/12  6:20 PM      Result Value Range   Specimen Description WOUND EXIT SITE ARM RIGHT     Special Requests       Value: PATIENT ON FOLLOWING VANCOMYCIN INFECTED AV GRAFT SWAB   Gram Stain       Value: FEW WBC PRESENT, PREDOMINANTLY PMN     NO SQUAMOUS EPITHELIAL CELLS SEEN     RARE GRAM POSITIVE COCCI     IN PAIRS   Culture PENDING     Report Status PENDING    WOUND CULTURE     Status: None   Collection Time    11/06/12  6:20 PM      Result Value Range   Specimen Description GRAFT ARM RIGHT     Special Requests PATIENT ON FOLLOWING VANCOMYCIN AV GRAFT     Gram Stain       Value: ABUNDANT WBC PRESENT, PREDOMINANTLY PMN     NO SQUAMOUS EPITHELIAL CELLS SEEN     RARE GRAM POSITIVE COCCI     IN PAIRS   Culture NO GROWTH     Report Status PENDING    ANAEROBIC CULTURE     Status: None   Collection Time    11/06/12  6:20 PM      Result Value Range   Specimen Description WOUND EXIT SITE ARM RIGHT     Special Requests       Value: PATIENT ON FOLLOWING VANCOMYCIN INFECTED AV GRAFT SWAB   Gram Stain       Value: FEW WBC PRESENT, PREDOMINANTLY PMN     NO SQUAMOUS EPITHELIAL CELLS SEEN     RARE GRAM POSITIVE COCCI     IN PAIRS   Culture       Value: NO ANAEROBES ISOLATED; CULTURE IN PROGRESS FOR 5 DAYS   Report Status PENDING    GLUCOSE, CAPILLARY     Status: Abnormal   Collection Time     11/06/12  6:53 PM      Result Value Range   Glucose-Capillary 126 (*) 70 - 99 mg/dL   Comment 1 Documented in Chart     Comment 2 Notify RN    GLUCOSE, CAPILLARY     Status: Abnormal  Collection Time    11/06/12 10:22 PM      Result Value Range   Glucose-Capillary 176 (*) 70 - 99 mg/dL   Comment 1 Documented in Chart     Comment 2 Notify RN    CBC     Status: Abnormal   Collection Time    11/06/12 11:35 PM      Result Value Range   WBC 9.6  4.0 - 10.5 K/uL   RBC 3.07 (*) 4.22 - 5.81 MIL/uL   Hemoglobin 9.1 (*) 13.0 - 17.0 g/dL   HCT 28.1 (*) 39.0 - 52.0 %   MCV 91.5  78.0 - 100.0 fL   MCH 29.6  26.0 - 34.0 pg   MCHC 32.4  30.0 - 36.0 g/dL   RDW 13.5  11.5 - 15.5 %   Platelets 87 (*) 150 - 400 K/uL   Comment: CONSISTENT WITH PREVIOUS RESULT  COMPREHENSIVE METABOLIC PANEL     Status: Abnormal   Collection Time    11/06/12 11:35 PM      Result Value Range   Sodium 138  135 - 145 mEq/L   Potassium 3.6  3.5 - 5.1 mEq/L   Chloride 94 (*) 96 - 112 mEq/L   CO2 33 (*) 19 - 32 mEq/L   Glucose, Bld 249 (*) 70 - 99 mg/dL   BUN 21  6 - 23 mg/dL   Creatinine, Ser 7.05 (*) 0.50 - 1.35 mg/dL   Calcium 8.7  8.4 - 10.5 mg/dL   Total Protein 7.6  6.0 - 8.3 g/dL   Albumin 3.2 (*) 3.5 - 5.2 g/dL   AST 15  0 - 37 U/L   ALT 9  0 - 53 U/L   Alkaline Phosphatase 53  39 - 117 U/L   Total Bilirubin 0.4  0.3 - 1.2 mg/dL   GFR calc non Af Amer 8 (*) >90 mL/min   GFR calc Af Amer 9 (*) >90 mL/min   Comment:            The eGFR has been calculated     using the CKD EPI equation.     This calculation has not been     validated in all clinical     situations.     eGFR's persistently     <90 mL/min signify     possible Chronic Kidney Disease.  GLUCOSE, CAPILLARY     Status: Abnormal   Collection Time    11/07/12 12:09 AM      Result Value Range   Glucose-Capillary 250 (*) 70 - 99 mg/dL   Comment 1 Documented in Chart     Comment 2 Notify RN    GLUCOSE, CAPILLARY     Status: Abnormal    Collection Time    11/07/12  4:03 AM      Result Value Range   Glucose-Capillary 262 (*) 70 - 99 mg/dL   Comment 1 Documented in Chart     Comment 2 Notify RN    GLUCOSE, CAPILLARY     Status: Abnormal   Collection Time    11/07/12  7:19 AM      Result Value Range   Glucose-Capillary 171 (*) 70 - 99 mg/dL   Comment 1 Documented in Chart    GLUCOSE, CAPILLARY     Status: Abnormal   Collection Time    11/07/12 11:31 AM      Result Value Range   Glucose-Capillary 52 (*) 70 - 99 mg/dL   Comment  1 Documented in Chart    GLUCOSE, CAPILLARY     Status: Abnormal   Collection Time    11/07/12 12:00 PM      Result Value Range   Glucose-Capillary 57 (*) 70 - 99 mg/dL  GLUCOSE, CAPILLARY     Status: Abnormal   Collection Time    11/07/12 12:11 PM      Result Value Range   Glucose-Capillary 66 (*) 70 - 99 mg/dL   Comment 1 Documented in Chart    GLUCOSE, CAPILLARY     Status: Abnormal   Collection Time    11/07/12  1:00 PM      Result Value Range   Glucose-Capillary 124 (*) 70 - 99 mg/dL   Constitutional: negative for chills, fatigue, fevers and sweats Ears, nose, mouth, throat, and face: negative for earaches, hoarseness, nasal congestion and sore throat Respiratory: negative for cough, dyspnea on exertion, hemoptysis and sputum Cardiovascular: negative for chest pain, chest pressure/discomfort, dyspnea, orthopnea and palpitations Gastrointestinal: negative for abdominal pain, change in bowel habits, nausea and vomiting Genitourinary:negative, anuric Musculoskeletal:negative for arthralgias, back pain, myalgias and neck pain Neurological: negative for coordination problems, dizziness, headaches, paresthesia and weakness  Physical Exam: Filed Vitals:   11/07/12 1200  BP: 173/78  Pulse: 95  Temp: 97.5 F (36.4 C)  Resp: 18     General appearance: alert, cooperative and no distress Head: Normocephalic, without obvious abnormality, atraumatic Neck: no adenopathy, no carotid  bruit, no JVD and supple, symmetrical, trachea midline Resp: clear to auscultation bilaterally Cardio: regular rate and rhythm, S1, S2 normal, no murmur, click, rub or gallop GI: soft, non-tender; bowel sounds normal; no masses,  no organomegaly Extremities: no edema, ACE wrap on right upper arm Neurologic: Grossly normal Dialysis Access: AVG @ RUA with ACE   Assessment/Plan: 1. Infected RUA AVG - s/p replacement of infected segment by Dr. Kellie Simmering 4/25; blood cultures 4/9 + for MRSA, on Vancomycin since 4/9.  Dialysis catheter placement on 4/28. 2. ESRD - HD on MWF @ [facility]; K 3.6 post-HD yesterday.  Next HD s/p catheter placement 4/28. 3. Hypertension/volume  - BP 173/78, no meds; no signs or symptoms of fluid overload. 4. Anemia  - Hgb 9.1 on outpatient Epogen. 5. Metabolic bone disease -  Ca 8.7; no Hectorol, Phoslo 4 with meals. 6. Nutrition - renal diet, vitamin.  Neddie Steedman 11/07/2012, 3:20 PM   Attending Nephrologist: Elmarie Shiley, MD

## 2012-11-07 NOTE — Consult Note (Signed)
I have personally seen and examined this patient and agree with the assessment/plan as outlined above by Lyles PA. [name] is here s/p infected RUA AVG removal for IV ABx and pain control- may possibly DC on Monday after [name] placed and patient dialyzed Ricky Willmott K.,MD 11/07/2012 4:24 PM

## 2012-11-07 NOTE — Progress Notes (Addendum)
[facility]  Subjective  - No new complaints.  Feels well.  Pain well controlled   Objective 165/70 90 97.7 F (36.5 C) (Oral) 18 100%  Intake/Output Summary (Last 24 hours) at 11/07/12 1059 Last data filed at 11/07/12 0803  Gross per 24 hour  Intake   1628 ml  Output    151 ml  Net   1477 ml    Right arm dressing removed. Wound bed beefy red no active bleeding. Palpable thrill in ne AV graft lateral to wound site. Wet to dry dressing replaced over wound wrapped with ace.  Assessment/Planning: POD #1 Infected av graft-right upper arm New av graft placed laterally. Wet to dry dressing per nursing BID He will have diatek placed by Dr. Orma Render. Then dialysis before discharge home. Theda Sers, EMMA Dolores Lory, [facility] 11/07/2012 10:59 AM --  Laboratory Lab Results:  Recent Labs  11/06/12 1407 11/06/12 2335  WBC 8.6 9.6  HGB 9.8* 9.1*  HCT 29.9* 28.1*  PLT 92* 87*   BMET  Recent Labs  11/06/12 1407 11/06/12 2335  NA 138 138  K 4.1 3.6  CL 95* 94*  CO2 33* 33*  GLUCOSE 117* 249*  BUN 17 21  CREATININE 5.87* 7.05*  CALCIUM 9.1 8.7    COAG Lab Results  Component Value Date   INR 0.94 11/06/2012   INR 1.00 02/13/2012   INR 1.05 08/31/2010   No results found for this basename: PTT      Agree with above plan AV graft in right arm functioning well peripheral to abscess site  continued daily dressing changes twice a day and IV antibiotics Check culture results in a.m. DC home on the following dialysis catheter and in-hospital hemodialysis

## 2012-11-07 NOTE — Progress Notes (Signed)
Hypoglycemic Event  CBG:52  Treatment: 15 GM carbohydrate snack  Symptoms: None  Follow-up CBG: Time:1200 CBG Result:66  Possible Reasons for Event: Inadequate meal intake  Comments/MD notified: Pt did not eat breakfast this am after insulin was already given. Pt is currently eating lunch and a small juice was given. CBG 66, another carb snack given, CBG 124. Pt states that he stays around 250 at home and almost never takes sliding scale insulin as he is very sensitive.     [name]  Remember to initiate Hypoglycemia Order Set & complete

## 2012-11-07 NOTE — Progress Notes (Signed)
Pt does not want insulin for CBG 172 as he had a hypoglycemic event this am after receiving insulin. Checked pt's CBG again after eating dinner, pt's CBG 165. Pt still does not want insulin.

## 2012-11-08 LAB — GLUCOSE, CAPILLARY: Glucose-Capillary: 255 mg/dL — ABNORMAL HIGH (ref 70–99)

## 2012-11-08 NOTE — Progress Notes (Signed)
Patient has order for PRN hydralazine for systolic pressure equal to or greater than 170, however patient has for now refused medication. We will continue to monitor his pressure and offer again PRN medication when appropriate.

## 2012-11-08 NOTE — Progress Notes (Signed)
Utilization Review Completed.   Tyese Finken, RN, BSN Nurse Case Manager  [phone]  

## 2012-11-08 NOTE — Progress Notes (Addendum)
[facility]   Subjective  - No new complaints.     Objective 153/74 100 98.1 F (36.7 C) (Oral) 18 98%  Intake/Output Summary (Last 24 hours) at 11/08/12 0814 Last data filed at 11/08/12 0500  Gross per 24 hour  Intake    800 ml  Output      0 ml  Net    800 ml    Left upper arm dressing in tact clean and dry.  Change per nursing bid. No symptoms of steal  Assessment/Planning: POD #2 Infected av graft-right upper arm  New av graft placed laterally.  Wet to dry dressing per nursing BID  He will have diatek placed by Dr. Orma Render. Then dialysis before discharge home.    Laurence Slate [facility] 11/08/2012 8:14 AM --  Laboratory Lab Results:  Recent Labs  11/06/12 1407 11/06/12 2335  WBC 8.6 9.6  HGB 9.8* 9.1*  HCT 29.9* 28.1*  PLT 92* 87*   BMET  Recent Labs  11/06/12 1407 11/06/12 2335  NA 138 138  K 4.1 3.6  CL 95* 94*  CO2 33* 33*  GLUCOSE 117* 249*  BUN 17 21  CREATININE 5.87* 7.05*  CALCIUM 9.1 8.7    COAG Lab Results  Component Value Date   INR 0.94 11/06/2012   INR 1.00 02/13/2012   INR 1.05 08/31/2010   No results found for this basename: PTT      Right upper arm AV graft patent Cultures thus far revealed gram-positive cocci Dressing changes twice a day per nursing staff  Plan insertion dialysis catheter in a.m. And then hemodialysis and then discharged home  return to see me in 2 weeks

## 2012-11-08 NOTE — Progress Notes (Signed)
Patient ID: [name], male   DOB: [birth year], 47 y.o.   MRN: [id number]   [facility] Progress Note    Subjective:   Reports to be feeling fair- reports minimal RUE pain   Objective:   BP 153/74  Pulse 100  Temp(Src) 98.1 F (36.7 C) (Oral)  Resp 18  Ht 6' 0.84" (1.85 m)  Wt 115.7 kg (255 lb 1.2 oz)  BMI 33.81 kg/m2  SpO2 98%  Physical Exam: [id number] resting in bed [id number] RRR, normal S1 and S2  Resp:Coarse BS bilaterally, no rales/rhonchi [id number], obese, NT, BS normal Ext:No LE edema, RUA in ACE wrap dressing  Labs: BMET  Recent Labs Lab 11/06/12 1407 11/06/12 2335  NA 138 138  K 4.1 3.6  CL 95* 94*  CO2 33* 33*  GLUCOSE 117* 249*  BUN 17 21  CREATININE 5.87* 7.05*  CALCIUM 9.1 8.7   CBC  Recent Labs Lab 11/06/12 1407 11/06/12 2335  WBC 8.6 9.6  NEUTROABS 6.1  --   HGB 9.8* 9.1*  HCT 29.9* 28.1*  MCV 90.6 91.5  PLT 92* 87*   Medications:    . calcium acetate  2,668 mg Oral TID WC  . insulin aspart  0-15 Units Subcutaneous Q4H  . insulin aspart protamine- aspart  40 Units Subcutaneous Q breakfast  . insulin aspart protamine- aspart  50 Units Subcutaneous Q supper  . multivitamin  1 tablet Oral QHS  . pantoprazole  40 mg Oral Daily  . sodium chloride  3 mL Intravenous Q12H  . [START ON 11/09/2012] vancomycin  1,000 mg Intravenous Q M,W,F-HD     Assessment/ Plan:   1. Infected RUA AVG - s/p replacement of infected segment by Dr. Kellie Simmering 4/25; Wound cultures 4/25 positive for Staph (sensitiviy pending but likely MRSA) on Vancomycin since 4/9. Dialysis catheter placement on 4/28 after "catheter holiday". 2. ESRD - HD on MWF @ [facility]. Next HD s/p catheter placement 4/28. The patient inquires about DC to dialyze at his OP HD unit due to transportation issues 3. Hypertension/volume - BP 173/78, no meds; no signs or symptoms of fluid overload. 4. Anemia - Hgb 9.1 on outpatient Epogen. 5. Metabolic bone disease - Ca 8.7; no  Hectorol, Phoslo 4 with meals. 6. Nutrition - renal diet, vitamin.    Elmarie Shiley, MD 11/08/2012, 9:03 AM

## 2012-11-09 ENCOUNTER — Telehealth: Payer: Self-pay | Admitting: Vascular Surgery

## 2012-11-09 ENCOUNTER — Encounter (HOSPITAL_COMMUNITY)
Admission: EM | Disposition: A | Discharge status: Home / Self Care | Payer: Self-pay | Source: Home / Self Care | Attending: Vascular Surgery

## 2012-11-09 ENCOUNTER — Inpatient Hospital Stay (HOSPITAL_COMMUNITY): Payer: Medicare Other | Admitting: Certified Registered"

## 2012-11-09 ENCOUNTER — Encounter (HOSPITAL_COMMUNITY): Payer: Self-pay | Admitting: Certified Registered"

## 2012-11-09 ENCOUNTER — Encounter (HOSPITAL_COMMUNITY): Payer: Self-pay | Admitting: Vascular Surgery

## 2012-11-09 ENCOUNTER — Inpatient Hospital Stay (HOSPITAL_COMMUNITY): Payer: Medicare Other

## 2012-11-09 DIAGNOSIS — N186 End stage renal disease: Secondary | ICD-10-CM

## 2012-11-09 HISTORY — PX: INSERTION OF DIALYSIS CATHETER: SHX1324

## 2012-11-09 LAB — GLUCOSE, CAPILLARY
Glucose-Capillary: 58 mg/dL — ABNORMAL LOW (ref 70–99)
Glucose-Capillary: 66 mg/dL — ABNORMAL LOW (ref 70–99)

## 2012-11-09 LAB — RENAL FUNCTION PANEL
Calcium: 8.6 mg/dL (ref 8.4–10.5)
GFR calc Af Amer: 4 mL/min — ABNORMAL LOW (ref >90)
GFR calc non Af Amer: 4 mL/min — ABNORMAL LOW (ref >90)
Phosphorus: 7.9 mg/dL — ABNORMAL HIGH (ref 2.3–4.6)
Sodium: 135 mEq/L (ref 135–145)

## 2012-11-09 LAB — POCT I-STAT 4, (NA,K, GLUC, HGB,HCT)
HCT: 26 % — ABNORMAL LOW (ref 39.0–52.0)
Hemoglobin: 8.8 g/dL — ABNORMAL LOW (ref 13.0–17.0)
Potassium: 4.3 mEq/L (ref 3.5–5.1)
Sodium: 136 mEq/L (ref 135–145)

## 2012-11-09 LAB — CBC
MCH: 28.8 pg (ref 26.0–34.0)
MCHC: 32.3 g/dL (ref 30.0–36.0)
Platelets: 115 10*3/uL — ABNORMAL LOW (ref 150–400)

## 2012-11-09 LAB — WOUND CULTURE

## 2012-11-09 SURGERY — INSERTION OF DIALYSIS CATHETER
Anesthesia: General | Site: Thigh | Laterality: Right | Wound class: Clean

## 2012-11-09 MED ORDER — SODIUM CHLORIDE 0.9 % IV SOLN
INTRAVENOUS | Status: DC | PRN
  Administered 2012-11-09: 11:00:00 via INTRAVENOUS

## 2012-11-09 MED ORDER — DARBEPOETIN ALFA-POLYSORBATE 40 MCG/0.4ML IJ SOLN
INTRAMUSCULAR | Status: AC
  Administered 2012-11-09: 40 ug via INTRAVENOUS
  Filled 2012-11-09: qty 0.4

## 2012-11-09 MED ORDER — HEPARIN SODIUM (PORCINE) 1000 UNIT/ML IJ SOLN
INTRAMUSCULAR | Status: DC | PRN
  Administered 2012-11-09: 7 mL

## 2012-11-09 MED ORDER — BUPIVACAINE HCL (PF) 0.5 % IJ SOLN
INTRAMUSCULAR | Status: DC | PRN
  Administered 2012-11-09: 10 mL

## 2012-11-09 MED ORDER — OXYCODONE-ACETAMINOPHEN 5-325 MG PO TABS: 1.0000 | ORAL_TABLET | ORAL | Status: DC | PRN

## 2012-11-09 MED ORDER — HEPARIN SODIUM (PORCINE) 1000 UNIT/ML DIALYSIS
8000.0000 [IU] | INTRAMUSCULAR | Status: DC | PRN
  Filled 2012-11-09: qty 8

## 2012-11-09 MED ORDER — LIDOCAINE-EPINEPHRINE (PF) 1 %-1:200000 IJ SOLN
INTRAMUSCULAR | Status: AC
  Filled 2012-11-09: qty 10

## 2012-11-09 MED ORDER — DARBEPOETIN ALFA-POLYSORBATE 40 MCG/0.4ML IJ SOLN: 40.0000 ug | INTRAMUSCULAR | Status: DC

## 2012-11-09 MED ORDER — LIDOCAINE-EPINEPHRINE (PF) 1 %-1:200000 IJ SOLN
INTRAMUSCULAR | Status: DC | PRN
  Administered 2012-11-09: 30 mL

## 2012-11-09 MED ORDER — 0.9 % SODIUM CHLORIDE (POUR BTL) OPTIME
TOPICAL | Status: DC | PRN
  Administered 2012-11-09: 1000 mL

## 2012-11-09 MED ORDER — HYDROMORPHONE HCL PF 1 MG/ML IJ SOLN: 0.2500 mg | INTRAMUSCULAR | Status: DC | PRN

## 2012-11-09 MED ORDER — BUPIVACAINE HCL (PF) 0.5 % IJ SOLN
INTRAMUSCULAR | Status: AC
  Filled 2012-11-09: qty 30

## 2012-11-09 MED ORDER — SODIUM CHLORIDE 0.9 % IV SOLN
INTRAVENOUS | Status: DC
  Administered 2012-11-09: 10:00:00 via INTRAVENOUS

## 2012-11-09 MED ORDER — VANCOMYCIN HCL IN DEXTROSE 1-5 GM/200ML-% IV SOLN
INTRAVENOUS | Status: AC
  Administered 2012-11-09: 1000 mg via INTRAVENOUS
  Filled 2012-11-09: qty 200

## 2012-11-09 MED ORDER — FENTANYL CITRATE 0.05 MG/ML IJ SOLN
INTRAMUSCULAR | Status: DC | PRN
  Administered 2012-11-09: 25 ug via INTRAVENOUS

## 2012-11-09 MED ORDER — MIDAZOLAM HCL 5 MG/5ML IJ SOLN
INTRAMUSCULAR | Status: DC | PRN
  Administered 2012-11-09 (×2): 1 mg via INTRAVENOUS

## 2012-11-09 MED ORDER — VANCOMYCIN HCL 1000 MG IV SOLR
1000.0000 mg | INTRAVENOUS | Status: DC | PRN
  Administered 2012-11-09: 1000 mg via INTRAVENOUS

## 2012-11-09 MED ORDER — SODIUM CHLORIDE 0.9 % IR SOLN
Status: DC | PRN
  Administered 2012-11-09: 11:00:00

## 2012-11-09 MED ORDER — HEPARIN SODIUM (PORCINE) 1000 UNIT/ML IJ SOLN
INTRAMUSCULAR | Status: AC
  Filled 2012-11-09: qty 1

## 2012-11-09 MED ORDER — PROPOFOL INFUSION 10 MG/ML OPTIME
INTRAVENOUS | Status: DC | PRN
  Administered 2012-11-09: 25 ug/kg/min via INTRAVENOUS

## 2012-11-09 SURGICAL SUPPLY — 52 items
ADH SKN CLS APL DERMABOND .7 (GAUZE/BANDAGES/DRESSINGS) ×1
BAG DECANTER FOR FLEXI CONT (MISCELLANEOUS) ×2 IMPLANT
BLADE SURG CLIPPER 3M 9600 (MISCELLANEOUS) ×1 IMPLANT
CATH CANNON HEMO 15F 50CM (CATHETERS) ×1 IMPLANT
CATH CANNON HEMO 15FR 19 (HEMODIALYSIS SUPPLIES) IMPLANT
CATH CANNON HEMO 15FR 23CM (HEMODIALYSIS SUPPLIES) IMPLANT
CATH CANNON HEMO 15FR 31CM (HEMODIALYSIS SUPPLIES) IMPLANT
CATH CANNON HEMO 15FR 32 (HEMODIALYSIS SUPPLIES) IMPLANT
CATH CANNON HEMO 15FR 32CM (HEMODIALYSIS SUPPLIES) IMPLANT
CATH STRAIGHT 5FR 65CM (CATHETERS) ×1 IMPLANT
CLOTH BEACON ORANGE TIMEOUT ST (SAFETY) ×2 IMPLANT
COVER PROBE W GEL 5X96 (DRAPES) ×2 IMPLANT
COVER SURGICAL LIGHT HANDLE (MISCELLANEOUS) ×2 IMPLANT
DERMABOND ADVANCED (GAUZE/BANDAGES/DRESSINGS) ×1
DERMABOND ADVANCED .7 DNX12 (GAUZE/BANDAGES/DRESSINGS) IMPLANT
DRAPE C-ARM 42X72 X-RAY (DRAPES) ×2 IMPLANT
DRAPE CHEST BREAST 15X10 FENES (DRAPES) ×3 IMPLANT
GAUZE SPONGE 2X2 8PLY STRL LF (GAUZE/BANDAGES/DRESSINGS) ×1 IMPLANT
GAUZE SPONGE 4X4 16PLY XRAY LF (GAUZE/BANDAGES/DRESSINGS) ×2 IMPLANT
GLOVE BIO SURGEON STRL SZ7 (GLOVE) ×2 IMPLANT
GLOVE BIOGEL PI IND STRL 6.5 (GLOVE) IMPLANT
GLOVE BIOGEL PI IND STRL 7.0 (GLOVE) IMPLANT
GLOVE BIOGEL PI IND STRL 7.5 (GLOVE) ×1 IMPLANT
GLOVE BIOGEL PI INDICATOR 6.5 (GLOVE) ×1
GLOVE BIOGEL PI INDICATOR 7.0 (GLOVE) ×2
GLOVE BIOGEL PI INDICATOR 7.5 (GLOVE) ×1
GLOVE ECLIPSE 7.0 STRL STRAW (GLOVE) ×2 IMPLANT
GOWN STRL NON-REIN LRG LVL3 (GOWN DISPOSABLE) ×5 IMPLANT
KIT BASIN OR (CUSTOM PROCEDURE TRAY) ×2 IMPLANT
KIT ROOM TURNOVER OR (KITS) ×2 IMPLANT
NDL 18GX1X1/2 (RX/OR ONLY) (NEEDLE) ×1 IMPLANT
NDL HYPO 25GX1X1/2 BEV (NEEDLE) ×1 IMPLANT
NEEDLE 18GX1X1/2 (RX/OR ONLY) (NEEDLE) ×2 IMPLANT
NEEDLE HYPO 25GX1X1/2 BEV (NEEDLE) ×2 IMPLANT
NS IRRIG 1000ML POUR BTL (IV SOLUTION) ×2 IMPLANT
PACK SURGICAL SETUP 50X90 (CUSTOM PROCEDURE TRAY) ×2 IMPLANT
PAD ARMBOARD 7.5X6 YLW CONV (MISCELLANEOUS) ×4 IMPLANT
SOAP 2 % CHG 4 OZ (WOUND CARE) ×2 IMPLANT
SPONGE GAUZE 2X2 STER 10/PKG (GAUZE/BANDAGES/DRESSINGS) ×1
SUT ETHILON 3 0 PS 1 (SUTURE) ×2 IMPLANT
SUT MNCRL AB 4-0 PS2 18 (SUTURE) ×2 IMPLANT
SYR 20CC LL (SYRINGE) ×4 IMPLANT
SYR 30ML LL (SYRINGE) IMPLANT
SYR 3ML LL SCALE MARK (SYRINGE) ×2 IMPLANT
SYR 5ML LL (SYRINGE) ×2 IMPLANT
SYR CONTROL 10ML LL (SYRINGE) ×2 IMPLANT
SYRINGE 10CC LL (SYRINGE) ×2 IMPLANT
TAPE CLOTH SURG 4X10 WHT LF (GAUZE/BANDAGES/DRESSINGS) ×1 IMPLANT
TOWEL OR 17X24 6PK STRL BLUE (TOWEL DISPOSABLE) ×2 IMPLANT
TOWEL OR 17X26 10 PK STRL BLUE (TOWEL DISPOSABLE) ×3 IMPLANT
WATER STERILE IRR 1000ML POUR (IV SOLUTION) ×2 IMPLANT
WIRE AMPLATZ SS-J .035X180CM (WIRE) ×1 IMPLANT

## 2012-11-09 NOTE — Progress Notes (Signed)
Dc instructions given to pt at this time re: f/u appts, meds, diet, activity, s/s of problems to report to md, Pharmacist, community, community resources.

## 2012-11-09 NOTE — Progress Notes (Signed)
ANTIBIOTIC CONSULT NOTE -Follow-up Pharmacy Consult for Vancomycin Indication: AVF infection  No Known Allergies  Patient Measurements: Height: 6' 0.83" (185 cm) Weight: 279 lb 1.6 oz (126.6 kg) IBW/kg (Calculated) : 79.52  Vital Signs: Temp: 97.1 F (36.2 C) (04/28 1345) Temp src: Oral (04/28 1345) BP: 154/90 mmHg (04/28 1345) Pulse Rate: 90 (04/28 1345)   Recent Labs  11/06/12 2335 11/09/12 1034 11/09/12 1342  WBC 9.6  --  6.5  HGB 9.1* 8.8* 8.3*  PLT 87*  --  115*  CREATININE 7.05*  --   --    Estimated Creatinine Clearance: 17.6 ml/min (by C-G formula based on Cr of 7.05).   Assessment: 47 y.o M with ESRD continuing on Vancomycin for empiric coverage of AVG infection -- now s/p I&D and replacement. S/p tunneled dialysis catheter placement this morning.  Remains afebrile, WBC 6.5. Wound culture MRSA positive, blood cultures NGTD/pending.  Antibiotics Vancomycin 4/25 >>  Cultures Wound culture (4/25) >> MRSA + Blood culture (4/25 x 2) >> NGTD/pending  Goal of Therapy:  Pre-HD Vancomycin level of 15-25 mcg/ml  Plan:  1. Continue Vancomycin 1g post HD sessions on MWF 3. F/u plans for discharge, clinical status, LOT, and pre-HD vancomycin level as appropriate if not discharged.   Woodroe Chen, PharmD, BCPS 11/09/2012   2:19 PM

## 2012-11-09 NOTE — Progress Notes (Signed)
Hypoglycemic Event  CBG: 60  Treatment:                    One oatmeal cookie Symptoms:                      none  Follow-up CBG: Time: 00:42 CBG Result: 66  Possible Reasons for Event:                         Insulin regiment Comments/MD notified: No    [name]  Remember to initiate Hypoglycemia Order Set & complete

## 2012-11-09 NOTE — Interval H&P Note (Signed)
[facility]  History and Physical Update  The patient was interviewed and re-examined.  The patient's previous History and Physical has been reviewed and is unchanged from Dr. Evelena Leyden consult on: 11/06/12 except for: interval excision of infected RU AVG segment and placement of new segment.  There is no change in the plan of care: tunneled dialysis catheter placement.  Adele Barthel, MD [facility] Office: [phone] Pager: [phone]  11/09/2012, 8:08 AM

## 2012-11-09 NOTE — Procedures (Signed)
Patient was seen on dialysis and the procedure was supervised.  BFR 350  Via femoral PC BP is  133/57.   Patient appears to be tolerating treatment well.  S/p placement of new femoral HD cath  [name] A 11/09/2012

## 2012-11-09 NOTE — Transfer of Care (Signed)
Immediate Anesthesia Transfer of Care Note  Patient: [name]  Procedure(s) Performed: Procedure(s) with comments: INSERTION OF DIALYSIS CATHETER (Right) - Right  Femoral Vein  Patient Location: PACU  Anesthesia Type:MAC  Level of Consciousness: awake, alert  and oriented  Airway & Oxygen Therapy: Patient Spontanous Breathing and Patient connected to nasal cannula oxygen  Post-op Assessment: Report given to PACU RN and Post -op Vital signs reviewed and stable  Post vital signs: Reviewed and stable  Complications: No apparent anesthesia complications

## 2012-11-09 NOTE — Progress Notes (Signed)
CRITICAL VALUE ALERT  Critical value received:  MRSA WOUND CZ  Date of notification:  11/09/12  Time of notification:  0920  Critical value read back:yes  Nurse who received alert:  Abran Richard  MD notified (1st page):  Gerri Lins PA  Time of first page:  0930  MD notified (2nd page):  Time of second page:  Responding MD:  Gerri Lins, PA  Time MD responded:  0930

## 2012-11-09 NOTE — Anesthesia Postprocedure Evaluation (Signed)
  Anesthesia Post-op Note  Patient: [name]  Procedure(s) Performed: Procedure(s) with comments: INSERTION OF DIALYSIS CATHETER (Right) - Right  Femoral Vein  Patient Location: PACU  Anesthesia Type:MAC  Level of Consciousness: awake, alert , oriented and patient cooperative  Airway and Oxygen Therapy: Patient Spontanous Breathing  Post-op Pain: none  Post-op Assessment: Post-op Vital signs reviewed, Patient's Cardiovascular Status Stable, Respiratory Function Stable, Patent Airway, No signs of Nausea or vomiting and Pain level controlled  Post-op Vital Signs: Reviewed and stable  Complications: No apparent anesthesia complications

## 2012-11-09 NOTE — Progress Notes (Signed)
Patient's blood sugar reading for midnight was 60, the patient ate an oatmeal cookie and was rechecked approximately 30 minutes later, his blood sugar was at 66. The patient refused any further interventions to increase his blood sugar. He states that he has been a diabetic for 20 years and knows when he is having a hypoglycemic event.  I will continue to monitor patient.

## 2012-11-09 NOTE — Op Note (Signed)
OPERATIVE NOTE  PROCEDURE: 1. Right femoral vein tunneled dialysis catheter placement 2. Right femoral vein cannulation under ultrasound guidance  PRE-OPERATIVE DIAGNOSIS: end-stage renal failure  POST-OPERATIVE DIAGNOSIS: same as above  SURGEON: Adele Barthel, MD  ANESTHESIA: local and MAC  ESTIMATED BLOOD LOSS: minimal  FINDING(S): 1.  Tips of the catheter in the right atrium on fluoroscopy  SPECIMEN(S):  none  INDICATIONS:   SANI GOLDSBERRY is a 47 y.o. male who presents with infected right upper arm arteriovenous graft.  The patient went to the OR with Dr. Kellie Simmering for excision of segment of infected graft and placement of new segment.  The patient presents for tunneled dialysis catheter placement.  The patient is aware the risks of tunneled dialysis catheter placement include but are not limited to: bleeding, infection, central venous injury, pneumothorax, possible venous stenosis, possible malpositioning in the venous system, and possible infections related to long-term catheter presence. The patient was aware of these risks and agreed to proceed.  DESCRIPTION: After written full informed consent was obtained from the patient, the patient was taken back to the operating room.  Prior to induction, the patient was given IV antibiotics.  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  After obtaining adequate sedation, the patient was prepped and draped in the standard fashion for a femoral vein tunneled dialysis catheter placement.  I anesthesized the right groin cannulation site with local anesthetic.  I could not see the femoral vein clearly and it was appeared to be posterior to the common femoral artery.  I changed sides to the left groin.  I anesthesized the left groin cannulation with local anesthesia.  Under ultrasound guidance, I attempted to cannulate the left femoral vein vein but unfortunately, I  cannulated the left common femoral artery with the 18 gauge needle. I pulled the needle and held pressure for 5 minutes.  I turned my attention back to the right groin.  Under ultrasound, I found a segment of the vein more proximal which was medial to the common femoral artery.  Under ultrasound guidance, the right femoral vein vein was cannulated with the 18 gauge needle with some resistance.  A J-wire was then placed into the right ventricle under fluroscopic guidance.  The wire was then secured in place with a clamp to the drapes.  The cannulation site, the catheter exit site, and tract for the subcutaneous tunnel were then anesthesized with a total of 40 cc of a 1:1 mixture of 0.5% Marcaine without epinepherine and 1% Lidocaine with epinepherine.  I then made stab incisions at the cannulation and exit sites.  I dissected from the exit site to the cannulation site and dilated the subcutaneous tunnel with a plastic dilator.  The wire was then unclamped and I removed the needle.  Under ultrasound guidance, I could see a bend in the wire, so I placed an end-hole catheter over the wire and exchanged the wire for an Amplatz wire.  The skin tract and venotomy was dilated serially with dilators over the wire.  Finally, the dilator-sheath was placed over wire under fluroscopic guidance into the right iliac vein.  The dilator and wire were removed.  A 55 cm Diatek catheter was placed under fluoroscopic guidance into the right atrium.  The sheath was broken and peeled away while holding the catheter cuff at the level of the skin.  The back end of this catheter was transected, revealing the  two lumens of this catheter.  The ports were docked onto these two lumens.  The catheter hub was then screwed into place.  Each port was tested by aspirating and flushing.  No resistance was noted.  Each port was then thoroughly flushed with heparinized saline.  The catheter was secured in placed with two interrupted stitches of 3-0  Nylon tied to the catheter.  The neck incision was closed with a U-stitch of 4-0 Monocryl.  The neck and chest incision were cleaned and sterile bandages applied.  Each port was then loaded with concentrated heparin (1000 Units/mL) at the manufacturer recommended volumes to each port.  Sterile caps were applied to each port.  On completion fluoroscopy, the tips of the catheter were in the right atrium, and there was no evidence of pneumothorax.  COMPLICATIONS: left common femoral artery cannulation  CONDITION: stable   Ebba Goll LIANG-YU, MD 11/09/2012 11:52 AM

## 2012-11-09 NOTE — Discharge Summary (Signed)
[facility] Discharge Summary   Patient ID:  [name] MRN: [id number] DOB/AGE: 08-31-1965 85 y.o.  Admit date: 11/06/2012 Discharge date: 11/09/2012 Date of Surgery: 11/06/2012 Surgeon: Surgeon(s): Mal Misty, MD  Admission Diagnosis: Hypertension [401.9] End stage renal disease on dialysis [585.6] Infection of AV graft for dialysis, initial encounter [996.62]  Discharge Diagnoses:  Hypertension [401.9] End stage renal disease on dialysis [585.6] Infection of AV graft for dialysis, initial encounter [996.62]  Secondary Diagnoses: Past Medical History  Diagnosis Date  . Diabetes mellitus   . Hypertension   . Chest pain   . Morbid obesity   . Bronchitis   . Blood transfusion   . H/O hiatal hernia   . Chronic kidney disease     d[facility], [facility]  . GERD (gastroesophageal reflux disease)   . Dysrhythmia     irregular heartbeat  . Hyperparathyroidism   . ESRD (end stage renal disease) on dialysis     first HD 06/19/04    Procedure(s): REMOVAL OF ARTERIOVENOUS GORETEX GRAFT ([place]) INSERTION OF ARTERIOVENOUS (AV) GORE-TEX GRAFT ARM  Discharged Condition: good  HPI: [name] is a 47 y.o. male who presents for evaluation of infected right upper arm AV graft. Patient had hybrid AV graft inserted by Dr. Lurline Hare and August 2013. It has been followed in the office by Dr. Clair Gulling recently with fluid around the graft. Patient was to return today to see Dr. Bridgett Larsson the office but because of increasing pain and swelling over the graft at dialysis he came to the emergency department. He has no history of recent chills or fever. The graft has been functioning well.  Post op wet to dry dressings BID.  He is going to the OR today and discharge afterwards.  He will go to his own dialysis center today.     Hospital Course:  [name] is a 47 y.o. male is S/P Right Procedure(s): REMOVAL OF ARTERIOVENOUS GORETEX GRAFT (AVGG) INSERTION OF  ARTERIOVENOUS (AV) GORE-TEX GRAFT ARM Extubated: POD # 0 Physical exam: Left upper arm dressing in tact clean and dry. Change per nursing bid.  No symptoms of steal  Post-op wounds clean, dry, intact or healing well Pt. Ambulating, voiding and taking PO diet without difficulty. Pt pain controlled with PO pain meds. Labs as below Complications:none  Consults:  Treatment Team:  Clayborne Dana. Posey Pronto, MD  Significant Diagnostic Studies: CBC Lab Results  Component Value Date   WBC 9.6 11/06/2012   HGB 9.1* 11/06/2012   HCT 28.1* 11/06/2012   MCV 91.5 11/06/2012   PLT 87* 11/06/2012    BMET    Component Value Date/Time   NA 138 11/06/2012 2335   K 3.6 11/06/2012 2335   CL 94* 11/06/2012 2335   CO2 33* 11/06/2012 2335   GLUCOSE 249* 11/06/2012 2335   BUN 21 11/06/2012 2335   CREATININE 7.05* 11/06/2012 2335   CALCIUM 8.7 11/06/2012 2335   GFRNONAA 8* 11/06/2012 2335   GFRAA 9* 11/06/2012 2335   COAG Lab Results  Component Value Date   INR 0.94 11/06/2012   INR 1.00 02/13/2012   INR 1.05 08/31/2010     Disposition:  Discharge to :Home Discharge Orders   Future Orders Complete By Expires     Call MD for:  redness, tenderness, or signs of infection (pain, swelling, bleeding, redness, odor or green/yellow discharge around incision site)  As directed     Call MD for:  severe or increased pain, loss  or decreased feeling  in affected limb(s)  As directed     Call MD for:  temperature >100.5  As directed     Discharge instructions  As directed     Comments:      Change wet to dry dressing 2 times daily    Discharge patient  As directed     Comments:      Discharge pt to home-  He will go to his own dialysis center not here today.    Increase activity slowly  As directed     Comments:      Walk with assistance use walker or cane as needed    Lifting restrictions  As directed     Comments:      No lifting for 12 weeks with right upper extremity.    Resume previous diet  As directed          Medication List    TAKE these medications       acetaminophen 500 MG tablet  Commonly known as:  TYLENOL  Take 1,000 mg by mouth every 6 (six) hours as needed for pain.     b complex-vitamin c-folic acid 0.8 MG Tabs  Take 0.8 mg by mouth at bedtime.     calcium acetate 667 MG capsule  Commonly known as:  PHOSLO  Take 2,668 mg by mouth 3 (three) times daily with meals.     insulin aspart protamine- aspart (70-30) 100 UNIT/ML injection  Commonly known as:  NOVOLOG 70/30  Inject 40-50 Units into the skin 2 (two) times daily with a meal. Take 40 units in the morning and 50 units in the evening     OVER THE COUNTER MEDICATION  Take 1 tablet by mouth daily as needed (allergies.). Allergy Relief.     oxyCODONE-acetaminophen 5-325 MG per tablet  Commonly known as:  PERCOCET/ROXICET  Take 1 tablet by mouth every 8 (eight) hours as needed for pain.     oxyCODONE-acetaminophen 5-325 MG per tablet  Commonly known as:  PERCOCET/ROXICET  Take 1-2 tablets by mouth every 4 (four) hours as needed.       Verbal and written Discharge instructions given to the patient. Wound care per Discharge AVS  Infected av graft-right upper arm  New av graft placed laterally.  Wet to dry dressing per nursing BID  He will have diatek placed by Dr. Orma Render. Then dialysis before discharge home.  He wishes to go to his own center for dialysis.  F/U with Dr. Kellie Simmering in 2 weeks.  His sister in law is a Marine scientist and she will change the dressings for him. No home health needed per patient.  He will continue to get vancomycin at dialysis until Dr. Kellie Simmering stops it. Rare gram positive cocci on cultures.    Addendum: MRSA Positive   Signed: Laurence Slate [facility] 11/09/2012, 7:35 AM   -

## 2012-11-09 NOTE — Progress Notes (Addendum)
[facility] [place]  Subjective  - Comfortable, ready to go home after diatek placement today.   Objective 160/84 97 97.8 F (36.6 C) (Oral) 16 100%  Intake/Output Summary (Last 24 hours) at 11/09/12 0728 Last data filed at 11/08/12 0952  Gross per 24 hour  Intake      0 ml  Output    300 ml  Net   -300 ml   Left upper arm dressing in tact clean and dry. Change per nursing bid.  No symptoms of steal  Assessment/Planning:  POD #3 Infected av graft-right upper arm  New av graft placed laterally.  Wet to dry dressing per nursing BID  He will have diatek placed by Dr. Orma Render. Then dialysis before discharge home. He wishes to go to his own center for dialysis.  F/U with Dr. Kellie Simmering in 2 weeks. His sister in law is a [employer] scientist and she will change the dressings for him.  No home health needed per patient. He will continue to get vancomycin at dialysis until Dr. Kellie Simmering stops it. Rare gram positive cocci on cultures.  Theda Sers, Kemal Amores Dolores Lory, [place] [facility] 11/09/2012 7:28 AM --  Laboratory Lab Results:  Recent Labs  11/06/12 1407 11/06/12 2335  WBC 8.6 9.6  HGB 9.8* 9.1*  HCT 29.9* 28.1*  PLT 92* 87*   BMET  Recent Labs  11/06/12 1407 11/06/12 2335  NA 138 138  K 4.1 3.6  CL 95* 94*  CO2 33* 33*  GLUCOSE 117* 249*  BUN 17 21  CREATININE 5.87* 7.05*  CALCIUM 9.1 8.7    COAG Lab Results  Component Value Date   INR 0.94 11/06/2012   INR 1.00 02/13/2012   INR 1.05 08/31/2010   No results found for this basename: PTT

## 2012-11-09 NOTE — Telephone Encounter (Signed)
LVM for pt, sent letter - kf

## 2012-11-09 NOTE — Telephone Encounter (Signed)
Message copied by Berniece Salines on Mon Nov 09, 2012 11:48 AM ------      Message from: Alfonso Patten      Created: Mon Nov 09, 2012  9:28 AM                   ----- Message -----         From: Ulyses Amor, PA-C         Sent: 11/09/2012   7:53 AM           To: Alfonso Patten, RN            S/p Irrigation and removal of infected graft right upper arm.  Placement of ne graft -diatek today.  Vanco at dialysis.  F/U with Dr. Kellie Simmering in 2 weeks.  His sister in law will do dressing changes BID. ------

## 2012-11-09 NOTE — Anesthesia Preprocedure Evaluation (Addendum)
Anesthesia Evaluation  Patient identified by MRN, date of birth, ID band Patient awake    Reviewed: Allergy & Precautions, H&P , NPO status , Patient's Chart, lab work & pertinent test results  History of Anesthesia Complications Negative for: history of anesthetic complications  Airway Mallampati: II TM Distance: >3 FB Neck ROM: Full    Dental  (+) Dental Advisory Given   Pulmonary Current Smoker,  breath sounds clear to auscultation  Pulmonary exam normal       Cardiovascular hypertension (no meds presently, controlled with dialysis), + Peripheral Vascular Disease - dysrhythmias Rhythm:Regular Rate:Normal     Neuro/Psych negative neurological ROS  negative psych ROS   GI/Hepatic Neg liver ROS, GERD-  Controlled,  Endo/Other  diabetes (controlled with dialysis, glu 144), Well Controlled, Type 2Morbid obesity  Renal/GU ESRF and DialysisRenal disease (MWF dialysis, K+)     Musculoskeletal   Abdominal (+) + obese,   Peds  Hematology   Anesthesia Other Findings   Reproductive/Obstetrics                           Anesthesia Physical Anesthesia Plan  ASA: III  Anesthesia Plan: MAC   Post-op Pain Management:    Induction:   Airway Management Planned: Natural Airway and Simple Face Mask  Additional Equipment:   Intra-op Plan:   Post-operative Plan:   Informed Consent: I have reviewed the patients History and Physical, chart, labs and discussed the procedure including the risks, benefits and alternatives for the proposed anesthesia with the patient or authorized representative who has indicated his/her understanding and acceptance.   Dental advisory given  Plan Discussed with: CRNA and Surgeon  Anesthesia Plan Comments: (Plan routine monitors, MAC)        Anesthesia Quick Evaluation

## 2012-11-10 ENCOUNTER — Encounter (HOSPITAL_COMMUNITY): Payer: Self-pay | Admitting: Vascular Surgery

## 2012-11-10 LAB — WOUND CULTURE

## 2012-11-11 LAB — ANAEROBIC CULTURE

## 2012-11-12 LAB — CULTURE, BLOOD (ROUTINE X 2)
Culture: NO GROWTH
Culture: NO GROWTH

## 2012-11-20 ENCOUNTER — Other Ambulatory Visit (HOSPITAL_COMMUNITY): Payer: Self-pay | Admitting: Interventional Radiology

## 2012-11-20 ENCOUNTER — Other Ambulatory Visit (HOSPITAL_COMMUNITY): Payer: Self-pay | Admitting: Nephrology

## 2012-11-20 ENCOUNTER — Encounter (HOSPITAL_COMMUNITY): Payer: Self-pay

## 2012-11-20 ENCOUNTER — Ambulatory Visit (HOSPITAL_COMMUNITY)
Admission: RE | Admit: 2012-11-20 | Discharge: 2012-11-20 | Disposition: A | Discharge status: Home / Self Care | Payer: Medicare Other | Source: Ambulatory Visit | Attending: Nephrology | Admitting: Nephrology

## 2012-11-20 DIAGNOSIS — Z79899 Other long term (current) drug therapy: Secondary | ICD-10-CM | POA: Insufficient documentation

## 2012-11-20 DIAGNOSIS — K219 Gastro-esophageal reflux disease without esophagitis: Secondary | ICD-10-CM | POA: Insufficient documentation

## 2012-11-20 DIAGNOSIS — N186 End stage renal disease: Secondary | ICD-10-CM

## 2012-11-20 DIAGNOSIS — F17200 Nicotine dependence, unspecified, uncomplicated: Secondary | ICD-10-CM | POA: Insufficient documentation

## 2012-11-20 DIAGNOSIS — I120 Hypertensive chronic kidney disease with stage 5 chronic kidney disease or end stage renal disease: Secondary | ICD-10-CM | POA: Insufficient documentation

## 2012-11-20 DIAGNOSIS — Z794 Long term (current) use of insulin: Secondary | ICD-10-CM | POA: Insufficient documentation

## 2012-11-20 DIAGNOSIS — I499 Cardiac arrhythmia, unspecified: Secondary | ICD-10-CM | POA: Insufficient documentation

## 2012-11-20 DIAGNOSIS — J40 Bronchitis, not specified as acute or chronic: Secondary | ICD-10-CM | POA: Insufficient documentation

## 2012-11-20 DIAGNOSIS — Y831 Surgical operation with implant of artificial internal device as the cause of abnormal reaction of the patient, or of later complication, without mention of misadventure at the time of the procedure: Secondary | ICD-10-CM | POA: Insufficient documentation

## 2012-11-20 DIAGNOSIS — T82898A Other specified complication of vascular prosthetic devices, implants and grafts, initial encounter: Secondary | ICD-10-CM | POA: Insufficient documentation

## 2012-11-20 DIAGNOSIS — E119 Type 2 diabetes mellitus without complications: Secondary | ICD-10-CM | POA: Insufficient documentation

## 2012-11-20 DIAGNOSIS — E213 Hyperparathyroidism, unspecified: Secondary | ICD-10-CM | POA: Insufficient documentation

## 2012-11-20 MED ORDER — CEFAZOLIN SODIUM-DEXTROSE 2-3 GM-% IV SOLR: 2.0000 g | Freq: Once | INTRAVENOUS | Status: DC

## 2012-11-20 MED ORDER — CEFAZOLIN SODIUM-DEXTROSE 2-3 GM-% IV SOLR
INTRAVENOUS | Status: AC
  Administered 2012-11-20: 2000 mg
  Filled 2012-11-20: qty 50

## 2012-11-20 MED ORDER — IOHEXOL 300 MG/ML  SOLN
50.0000 mL | Freq: Once | INTRAMUSCULAR | Status: AC | PRN
  Administered 2012-11-20: 10 mL via INTRAVENOUS

## 2012-11-20 MED ORDER — HEPARIN SODIUM (PORCINE) 1000 UNIT/ML IJ SOLN
INTRAMUSCULAR | Status: AC
  Filled 2012-11-20: qty 1

## 2012-11-20 MED ORDER — CHLORHEXIDINE GLUCONATE 4 % EX LIQD
CUTANEOUS | Status: AC
  Filled 2012-11-20: qty 45

## 2012-11-20 NOTE — Procedures (Signed)
Exchange R fem HD catheter No complication No blood loss. See complete dictation in [facility].

## 2012-11-20 NOTE — H&P (Signed)
Chief Complaint: HD catheter not working right  HPI: [name] is an 47 y.o. male with ESRD. He has a right UE AVG that is maturing. He also has a right femoral HD catheter placed by [facility] on 4/28. He has used it several times, but at his last treatment, the catheter became dysfunctional He is referred to IR for exchange. PMHx and meds reviewed. Has been NPO this morning.  Past Medical History:  Past Medical History  Diagnosis Date  . Diabetes mellitus   . Hypertension   . Chest pain   . Morbid obesity   . Bronchitis   . Blood transfusion   . H/O hiatal hernia   . Chronic kidney disease     dialysis, [facility]  . GERD (gastroesophageal reflux disease)   . Dysrhythmia     irregular heartbeat  . Hyperparathyroidism   . ESRD (end stage renal disease) on dialysis     first HD 06/19/04    Past Surgical History:  Past Surgical History  Procedure Laterality Date  . Av fistula placement  10/11/2010  . Ankle fusion      rt foot charcot joint 2008 with refusion  . Av fistula placement  07/02/2011    Procedure: INSERTION OF ARTERIOVENOUS (AV) GORE-TEX GRAFT ARM;  Surgeon: Elam Dutch, MD;  Location: [place];  Service: Vascular;  Laterality: Left;  . Thrombectomy    . Insertion of dialysis catheter  01/20/2012    Procedure: INSERTION OF DIALYSIS CATHETER;  Surgeon: Serafina Mitchell, MD;  Location: [place];  Service: Vascular;  Laterality: N/A;  insertion left internal jugular dialysis catheter 23cm  . Umbilcial hernia    . Parathyroidectomy    . Avgg removal Right 11/06/2012    Procedure: REMOVAL OF ARTERIOVENOUS GORETEX GRAFT ([place]);  Surgeon: Mal Misty, MD;  Location: [place];  Service: Vascular;  Laterality: Right;  . Av fistula placement Right 11/06/2012    Procedure: INSERTION OF ARTERIOVENOUS (AV) GORE-TEX GRAFT ARM;  Surgeon: Mal Misty, MD;  Location: [place];  Service: Vascular;  Laterality: Right;  . Insertion of dialysis catheter Right 11/09/2012    Procedure:  INSERTION OF DIALYSIS CATHETER;  Surgeon: Conrad Haslet, MD;  Location: [facility] OR;  Service: Vascular;  Laterality: Right;  Right  Femoral Vein    Family History:  Family History  Problem Relation Age of Onset  . Heart disease Mother   . Kidney disease Mother     Social History:  reports that he has been smoking Cigarettes.  He has a 2 pack-year smoking history. He has never used smokeless tobacco. He reports that he does not drink alcohol or use illicit drugs.  Allergies: No Known Allergies  Medications: b complex-vitamin c-folic acid (NEPHRO-VITE) 0.8 MG TABS Sig - Route: Take 0.8 mg by mouth at bedtime.   insulin aspart protamine-insulin aspart (NOVOLOG 70/30) (70-30) 100 UNIT/ML injection Sig - Route: Inject 40-50 Units into the skin 2 (two) times daily with a meal. Take 40 units in the morning and 50 units in the evening -   oxyCODONE-acetaminophen (PERCOCET/ROXICET) 5-325 MG per tablet 02/13/2012 Sig - Route: Take 1 tablet by mouth every 8 (eight) hours as needed for pain. -  Please HPI for pertinent positives, otherwise complete 10 system ROS negative.  Physical Exam: Blood pressure 145/80, pulse 98, temperature 97.9 F (36.6 C), temperature source Oral, resp. rate 16, SpO2 98.00%. There is no weight on file to calculate BMI.   General Appearance:  Alert, cooperative, no distress, appears stated age  Head:  Normocephalic, without obvious abnormality, atraumatic  ENT: Unremarkable  Neck: Supple, symmetrical, trachea midline,  Lungs:   Clear to auscultation bilaterally, no w/r/r, respirations unlabored without use of accessory muscles.  Chest Wall:  No tenderness or deformity  Heart:  Regular rate and rhythm, S1, S2 normal, no murmur, rub or gallop.   Extremities: Right femoral PC intact, NT  Neurologic: Normal affect, no gross deficits.   No results found for this or any previous visit (from the past 48 hour(s)). No results found.  Assessment/Plan ESRD Dysfunctional femoral  Permcath Discussed exchange, use of sedation, risks, complications. Consent signed in chart  Ascencion Dike PA-C 11/20/2012, 11:27 AM

## 2012-11-23 ENCOUNTER — Encounter: Payer: Self-pay | Admitting: Vascular Surgery

## 2012-11-24 ENCOUNTER — Ambulatory Visit: Payer: Medicare Other | Admitting: Vascular Surgery

## 2012-11-30 ENCOUNTER — Encounter: Payer: Self-pay | Admitting: Vascular Surgery

## 2012-12-01 ENCOUNTER — Ambulatory Visit: Payer: Medicare Other | Admitting: Vascular Surgery

## 2013-02-02 ENCOUNTER — Emergency Department (HOSPITAL_COMMUNITY)
Admission: EM | Admit: 2013-02-02 | Discharge: 2013-02-02 | Disposition: A | Discharge status: Home / Self Care | Payer: Medicare Other | Attending: Emergency Medicine | Admitting: Emergency Medicine

## 2013-02-02 ENCOUNTER — Encounter (HOSPITAL_COMMUNITY): Payer: Self-pay

## 2013-02-02 ENCOUNTER — Emergency Department (HOSPITAL_COMMUNITY): Payer: Medicare Other

## 2013-02-02 DIAGNOSIS — Z79899 Other long term (current) drug therapy: Secondary | ICD-10-CM | POA: Insufficient documentation

## 2013-02-02 DIAGNOSIS — E1169 Type 2 diabetes mellitus with other specified complication: Secondary | ICD-10-CM | POA: Insufficient documentation

## 2013-02-02 DIAGNOSIS — Z8709 Personal history of other diseases of the respiratory system: Secondary | ICD-10-CM | POA: Insufficient documentation

## 2013-02-02 DIAGNOSIS — Z8719 Personal history of other diseases of the digestive system: Secondary | ICD-10-CM | POA: Insufficient documentation

## 2013-02-02 DIAGNOSIS — I10 Essential (primary) hypertension: Secondary | ICD-10-CM | POA: Insufficient documentation

## 2013-02-02 DIAGNOSIS — L97509 Non-pressure chronic ulcer of other part of unspecified foot with unspecified severity: Secondary | ICD-10-CM | POA: Insufficient documentation

## 2013-02-02 DIAGNOSIS — F17200 Nicotine dependence, unspecified, uncomplicated: Secondary | ICD-10-CM | POA: Insufficient documentation

## 2013-02-02 DIAGNOSIS — K219 Gastro-esophageal reflux disease without esophagitis: Secondary | ICD-10-CM | POA: Insufficient documentation

## 2013-02-02 DIAGNOSIS — E11621 Type 2 diabetes mellitus with foot ulcer: Secondary | ICD-10-CM

## 2013-02-02 DIAGNOSIS — N186 End stage renal disease: Secondary | ICD-10-CM | POA: Insufficient documentation

## 2013-02-02 DIAGNOSIS — Z794 Long term (current) use of insulin: Secondary | ICD-10-CM | POA: Insufficient documentation

## 2013-02-02 DIAGNOSIS — Z992 Dependence on renal dialysis: Secondary | ICD-10-CM | POA: Insufficient documentation

## 2013-02-02 DIAGNOSIS — Z8679 Personal history of other diseases of the circulatory system: Secondary | ICD-10-CM | POA: Insufficient documentation

## 2013-02-02 LAB — GLUCOSE, CAPILLARY: Glucose-Capillary: 293 mg/dL — ABNORMAL HIGH (ref 70–99)

## 2013-02-02 MED ORDER — CIPROFLOXACIN HCL 500 MG PO TABS: 500.0000 mg | ORAL_TABLET | Freq: Two times a day (BID) | ORAL | Status: DC

## 2013-02-02 MED ORDER — SULFAMETHOXAZOLE-TRIMETHOPRIM 800-160 MG PO TABS: 1.0000 | ORAL_TABLET | Freq: Two times a day (BID) | ORAL | Status: DC

## 2013-02-02 NOTE — ED Provider Notes (Signed)
History    CSN: [id number] Arrival date & time 02/02/13  1727  First MD Initiated Contact with Patient 02/02/13 1741     Chief Complaint  Patient presents with  . Recurrent Skin Infections   (Consider location/radiation/quality/duration/timing/severity/associated sxs/prior Treatment) HPI Comments: Patient comes to the ER for evaluation of swelling and a wound of the left great toe. Patient has had this before. This reports the last time he added he was given antibiotics and got better. Patient is a diabetic. He says his sugars have been "up and down". Patient reports that he has noticed increased swelling since he bumped the toe 4 days ago.  Past Medical History  Diagnosis Date  . Diabetes mellitus   . Hypertension   . Chest pain   . Morbid obesity   . Bronchitis   . Blood transfusion   . H/O hiatal hernia   . Chronic kidney disease     dialysis, [facility]  . GERD (gastroesophageal reflux disease)   . Dysrhythmia     irregular heartbeat  . Hyperparathyroidism   . ESRD (end stage renal disease) on dialysis     first HD 06/19/04   Past Surgical History  Procedure Laterality Date  . Av fistula placement  10/11/2010  . Ankle fusion      rt foot charcot joint 2008 with refusion  . Av fistula placement  07/02/2011    Procedure: INSERTION OF ARTERIOVENOUS (AV) GORE-TEX GRAFT ARM;  Surgeon: Elam Dutch, MD;  Location: [place];  Service: Vascular;  Laterality: Left;  . Thrombectomy    . Insertion of dialysis catheter  01/20/2012    Procedure: INSERTION OF DIALYSIS CATHETER;  Surgeon: Serafina Mitchell, MD;  Location: [place];  Service: Vascular;  Laterality: N/A;  insertion left internal jugular dialysis catheter 23cm  . Umbilcial hernia    . Parathyroidectomy    . Avgg removal Right 11/06/2012    Procedure: REMOVAL OF ARTERIOVENOUS GORETEX GRAFT ([place]);  Surgeon: Mal Misty, MD;  Location: [place];  Service: Vascular;  Laterality: Right;  . Av fistula placement Right  11/06/2012    Procedure: INSERTION OF ARTERIOVENOUS (AV) GORE-TEX GRAFT ARM;  Surgeon: Mal Misty, MD;  Location: [place];  Service: Vascular;  Laterality: Right;  . Insertion of dialysis catheter Right 11/09/2012    Procedure: INSERTION OF DIALYSIS CATHETER;  Surgeon: Conrad Clay, MD;  Location: [facility] OR;  Service: Vascular;  Laterality: Right;  Right  Femoral Vein   Family History  Problem Relation Age of Onset  . Heart disease Mother   . Kidney disease Mother    History  Substance Use Topics  . Smoking status: Current Some Day Smoker -- 0.10 packs/day for 20 years    Types: Cigarettes  . Smokeless tobacco: Never Used     Comment: pt states he only smokes about 3 cigs per week  . Alcohol Use: No    Review of Systems  Constitutional: Negative for fever.  Skin: Positive for wound.  All other systems reviewed and are negative.    Allergies  Review of patient's allergies indicates no known allergies.  Home Medications   Current Outpatient Rx  Name  Route  Sig  Dispense  Refill  . acetaminophen (TYLENOL) 500 MG tablet   Oral   Take 1,000 mg by mouth every 6 (six) hours as needed for pain.         Marland Kitchen b complex-vitamin c-folic acid (NEPHRO-VITE) 0.8 MG TABS  Oral   Take 0.8 mg by mouth at bedtime.           . calcium acetate (PHOSLO) 667 MG capsule   Oral   Take 2,668 mg by mouth 3 (three) times daily with meals.         . insulin aspart protamine-insulin aspart (NOVOLOG 70/30) (70-30) 100 UNIT/ML injection   Subcutaneous   Inject 40-50 Units into the skin 2 (two) times daily with a meal. Take 40 units in the morning and 50 units in the evening         . OVER THE COUNTER MEDICATION   Oral   Take 1 tablet by mouth daily as needed (allergies.). Allergy Relief.         Marland Kitchen oxyCODONE-acetaminophen (PERCOCET/ROXICET) 5-325 MG per tablet   Oral   Take 1 tablet by mouth every 8 (eight) hours as needed for pain.          Marland Kitchen oxyCODONE-acetaminophen (PERCOCET/ROXICET)  5-325 MG per tablet   Oral   Take 1-2 tablets by mouth every 4 (four) hours as needed.   30 tablet   0    BP 162/94  Pulse 110  Temp(Src) 98.9 F (37.2 C) (Oral)  Resp 18  SpO2 97% Physical Exam  Constitutional: He is oriented to person, place, and time. He appears well-developed and well-nourished. No distress.  HENT:  Head: Normocephalic and atraumatic.  Right Ear: Hearing normal.  Left Ear: Hearing normal.  Nose: Nose normal.  Mouth/Throat: Oropharynx is clear and moist and mucous membranes are normal.  Eyes: Conjunctivae and EOM are normal. Pupils are equal, round, and reactive to light.  Neck: Normal range of motion. Neck supple.  Cardiovascular: Regular rhythm, S1 normal and S2 normal.  Exam reveals no gallop and no friction rub.   No murmur heard. Pulmonary/Chest: Effort normal and breath sounds normal. No respiratory distress. He exhibits no tenderness.  Abdominal: Soft. Normal appearance and bowel sounds are normal. There is no hepatosplenomegaly. There is no tenderness. There is no rebound, no guarding, no tenderness at McBurney's point and negative Murphy's sign. No hernia.  Musculoskeletal: Normal range of motion.  Neurological: He is alert and oriented to person, place, and time. He has normal strength. No cranial nerve deficit or sensory deficit. Coordination normal. GCS eye subscore is 4. GCS verbal subscore is 5. GCS motor subscore is 6.  Skin: Skin is warm, dry and intact. No rash noted. No cyanosis.     Psychiatric: He has a normal mood and affect. His speech is normal and behavior is normal. Thought content normal.    ED Course  Procedures (including critical care time) Labs Reviewed - No data to display Dg Toe Great Left  02/02/2013   *RADIOLOGY REPORT*  Clinical Data: Open wound.  Pain.  LEFT GREAT TOE  Comparison: None available.  Findings: Soft tissue swelling is present of the great toe.  The joints is located.  No osseous destruction is evident.  No  radiopaque foreign body is present.  IMPRESSION:  1.  Soft tissue swelling of the great toe without underlying osseous abnormality.   Original Report Authenticated By: San Morelle, M.D.   1. Diabetic ulcer of left great toe     MDM  Patient presents to the ER for evaluation of pain and swelling of the left great toe. There is a small area of diabetic ulcer present without obvious infection. There is no induration, erythema or drainage. X-ray does not show any evidence of osteomyelitis  or gas formation. Patient sugar is somewhat elevated, is due for his insulin this evening. Recommend he goes home he takes his medications as prescribed. He is to followup with his primary doctor as he might need wound care. Initiate on Cipro and Bactrim for broad-spectrum coverage for diabetic foot ulcer.  Orpah Greek, MD 02/02/13 [phone]

## 2013-02-02 NOTE — ED Notes (Signed)
Pt presents with c/o left great toe infection. Pt has a hx of same. Pt stumped his toe four days ago and since then it has started swelling. Small amount of drainage from a small wound on the side of the toe.

## 2013-05-25 ENCOUNTER — Emergency Department (HOSPITAL_COMMUNITY)
Admission: EM | Admit: 2013-05-25 | Discharge: 2013-05-26 | Disposition: A | Discharge status: Home / Self Care | Payer: Medicare Other | Attending: Emergency Medicine | Admitting: Emergency Medicine

## 2013-05-25 ENCOUNTER — Encounter (HOSPITAL_COMMUNITY): Payer: Self-pay | Admitting: Emergency Medicine

## 2013-05-25 ENCOUNTER — Emergency Department (HOSPITAL_COMMUNITY): Payer: Medicare Other

## 2013-05-25 DIAGNOSIS — L97509 Non-pressure chronic ulcer of other part of unspecified foot with unspecified severity: Secondary | ICD-10-CM | POA: Insufficient documentation

## 2013-05-25 DIAGNOSIS — Z794 Long term (current) use of insulin: Secondary | ICD-10-CM | POA: Insufficient documentation

## 2013-05-25 DIAGNOSIS — Z8709 Personal history of other diseases of the respiratory system: Secondary | ICD-10-CM | POA: Insufficient documentation

## 2013-05-25 DIAGNOSIS — F17200 Nicotine dependence, unspecified, uncomplicated: Secondary | ICD-10-CM | POA: Insufficient documentation

## 2013-05-25 DIAGNOSIS — Z8719 Personal history of other diseases of the digestive system: Secondary | ICD-10-CM | POA: Insufficient documentation

## 2013-05-25 DIAGNOSIS — E1169 Type 2 diabetes mellitus with other specified complication: Secondary | ICD-10-CM | POA: Insufficient documentation

## 2013-05-25 DIAGNOSIS — N186 End stage renal disease: Secondary | ICD-10-CM | POA: Insufficient documentation

## 2013-05-25 DIAGNOSIS — Z79899 Other long term (current) drug therapy: Secondary | ICD-10-CM | POA: Insufficient documentation

## 2013-05-25 DIAGNOSIS — Z992 Dependence on renal dialysis: Secondary | ICD-10-CM | POA: Insufficient documentation

## 2013-05-25 DIAGNOSIS — I120 Hypertensive chronic kidney disease with stage 5 chronic kidney disease or end stage renal disease: Secondary | ICD-10-CM | POA: Insufficient documentation

## 2013-05-25 DIAGNOSIS — E11621 Type 2 diabetes mellitus with foot ulcer: Secondary | ICD-10-CM

## 2013-05-25 MED ORDER — CLINDAMYCIN PHOSPHATE 600 MG/50ML IV SOLN
600.0000 mg | Freq: Once | INTRAVENOUS | Status: AC
  Administered 2013-05-26: 600 mg via INTRAVENOUS
  Filled 2013-05-25: qty 50

## 2013-05-25 NOTE — ED Notes (Signed)
Pt c/o sore on left great toe for 2 days.  St's he is diabetic and thinks he needs an antibiotic

## 2013-05-26 LAB — CBC
HCT: 35.4 % — ABNORMAL LOW (ref 39.0–52.0)
MCHC: 33.1 g/dL (ref 30.0–36.0)
MCV: 93.9 fL (ref 78.0–100.0)
RDW: 13.7 % (ref 11.5–15.5)

## 2013-05-26 LAB — BASIC METABOLIC PANEL
BUN: 50 mg/dL — ABNORMAL HIGH (ref 6–23)
Chloride: 91 mEq/L — ABNORMAL LOW (ref 96–112)
Creatinine, Ser: 10.99 mg/dL — ABNORMAL HIGH (ref 0.50–1.35)
GFR calc Af Amer: 6 mL/min — ABNORMAL LOW (ref >90)
GFR calc non Af Amer: 5 mL/min — ABNORMAL LOW (ref >90)

## 2013-05-26 MED ORDER — CLINDAMYCIN HCL 150 MG PO CAPS: 150.0000 mg | ORAL_CAPSULE | Freq: Four times a day (QID) | ORAL | Status: DC

## 2013-05-26 NOTE — ED Provider Notes (Signed)
CSN: [id number]     Arrival date & time 05/25/13  1841 History   First MD Initiated Contact with Patient 05/25/13 2234     Chief Complaint  Patient presents with  . Toe Pain   (Consider location/radiation/quality/duration/timing/severity/associated sxs/prior Treatment) Patient is a 47 y.o. male presenting with toe pain.  Toe Pain   Pt with history DM and ESRD on HD reports he has had a sore on his L great toe worsening swelling over the last 2 days. No pain, no drainage and no fever.   Past Medical History  Diagnosis Date  . Diabetes mellitus   . Hypertension   . Chest pain   . Morbid obesity   . Bronchitis   . Blood transfusion   . H/O hiatal hernia   . Chronic kidney disease     d[facility], [facility]  . GERD (gastroesophageal reflux disease)   . Dysrhythmia     irregular heartbeat  . Hyperparathyroidism   . ESRD (end stage renal disease) on dialysis     first HD 06/19/04   Past Surgical History  Procedure Laterality Date  . Av fistula placement  10/11/2010  . Ankle fusion      rt foot charcot joint 2008 with refusion  . Av fistula placement  07/02/2011    Procedure: INSERTION OF ARTERIOVENOUS (AV) GORE-TEX GRAFT ARM;  Surgeon: Elam Dutch, MD;  Location: [place];  Service: Vascular;  Laterality: Left;  . Thrombectomy    . Insertion of dialysis catheter  01/20/2012    Procedure: INSERTION OF DIALYSIS CATHETER;  Surgeon: Serafina Mitchell, MD;  Location: [place];  Service: Vascular;  Laterality: N/A;  insertion left internal jugular dialysis catheter 23cm  . Umbilcial hernia    . Parathyroidectomy    . Avgg removal Right 11/06/2012    Procedure: REMOVAL OF ARTERIOVENOUS GORETEX GRAFT ([place]);  Surgeon: Mal Misty, MD;  Location: [place];  Service: Vascular;  Laterality: Right;  . Av fistula placement Right 11/06/2012    Procedure: INSERTION OF ARTERIOVENOUS (AV) GORE-TEX GRAFT ARM;  Surgeon: Mal Misty, MD;  Location: [place];  Service: Vascular;  Laterality: Right;   . Insertion of dialysis catheter Right 11/09/2012    Procedure: INSERTION OF DIALYSIS CATHETER;  Surgeon: Conrad Buckhorn, MD;  [facility]: [facility] OR;  Service: Vascular;  Laterality: Right;  Right  Femoral Vein   Family History  Problem Relation Age of Onset  . Heart disease Mother   . Kidney disease Mother    History  Substance Use Topics  . Smoking status: Current Some Day Smoker -- 0.10 packs/day for 20 years    Types: Cigarettes  . Smokeless tobacco: Never Used     Comment: pt states he only smokes about 3 cigs per week  . Alcohol Use: No    Review of Systems All other systems reviewed and are negative except as noted in HPI.   Allergies  Review of patient's allergies indicates no known allergies.  Home Medications   Current Outpatient Rx  Name  Route  Sig  Dispense  Refill  . b complex-vitamin c-folic acid (NEPHRO-VITE) 0.8 MG TABS   Oral   Take 0.8 mg by mouth at bedtime.           . calcium acetate (PHOSLO) 667 MG capsule   Oral   Take 2,668 mg by mouth 3 (three) times daily with meals.         . insulin aspart protamine-insulin aspart (NOVOLOG  70/30) (70-30) 100 UNIT/ML injection   Subcutaneous   Inject 30-50 Units into the skin 3 (three) times daily after meals. Sliding scale.         Marland Kitchen oxyCODONE-acetaminophen (PERCOCET) 10-325 MG per tablet   Oral   Take 1-2 tablets by mouth every 4 (four) hours as needed for pain.          BP 171/90  Pulse 103  Temp(Src) 98.3 F (36.8 C) (Oral)  Resp 18  Ht 6\' 1"  (1.854 m)  Wt 278 lb 2 oz (126.157 kg)  BMI 36.70 kg/m2  SpO2 99% Physical Exam  Nursing note and vitals reviewed. Constitutional: He is oriented to person, place, and time. He appears well-developed and well-nourished.  HENT:  Head: Normocephalic and atraumatic.  Eyes: EOM are normal. Pupils are equal, round, and reactive to light.  Neck: Normal range of motion. Neck supple.  Cardiovascular: Normal rate, normal heart sounds and intact distal pulses.    Pulmonary/Chest: Effort normal and breath sounds normal.  Abdominal: Bowel sounds are normal. He exhibits no distension. There is no tenderness.  Musculoskeletal: Normal range of motion. He exhibits edema.  Moderate soft tissue swelling and early ulceration lateral L big toe, minimal warmth, no fluctuance or drainage. No pain with ROM  Neurological: He is alert and oriented to person, place, and time. He has normal strength. No cranial nerve deficit or sensory deficit.  Skin: Skin is warm and dry. No rash noted.  Psychiatric: He has a normal mood and affect.    ED Course  Procedures (including critical care time) Labs Review Labs Reviewed  GLUCOSE, CAPILLARY - Abnormal; Notable for the following:    Glucose-Capillary 240 (*)    All other components within normal limits  CBC - Abnormal; Notable for the following:    RBC 3.77 (*)    Hemoglobin 11.7 (*)    HCT 35.4 (*)    Platelets 70 (*)    All other components within normal limits  BASIC METABOLIC PANEL - Abnormal; Notable for the following:    Chloride 91 (*)    Glucose, Bld 273 (*)    BUN 50 (*)    Creatinine, Ser 10.99 (*)    GFR calc non Af Amer 5 (*)    GFR calc Af Amer 6 (*)    All other components within normal limits  SEDIMENTATION RATE   Imaging Review Dg Toe Great Left  05/26/2013   CLINICAL DATA:  Left great toe pain for 2 days.  EXAM: LEFT GREAT TOE  COMPARISON:  Left great toe radiographs performed 02/02/2013  FINDINGS: There is no evidence of fracture or dislocation. No osseous erosions are seen to suggest osteomyelitis. Visualized joint spaces are grossly preserved. Minimal degenerative change is noted at the 1st interphalangeal joint, unchanged from the prior study. No significant soft tissue abnormalities are characterized on radiograph. Diffuse vascular calcifications are seen.  IMPRESSION: 1. No evidence of osseous disruption. No osseous erosions seen to suggest osteomyelitis. 2. Diffuse vascular calcifications  seen.   Electronically Signed   By: Garald Balding M.D.   On: 05/26/2013 00:12    EKG Interpretation   None       MDM   1. Diabetic foot ulcer     Labs and imaging reviewed, except sed rate still pending. Given Clinda for diabetic skin infection. Afebrile with no significant leukocytosis, do not feel he needs admission.     Charles B. Karle Starch, MD 05/26/13 [phone]

## 2013-07-13 ENCOUNTER — Other Ambulatory Visit: Payer: Self-pay | Admitting: *Deleted

## 2013-07-13 DIAGNOSIS — M79609 Pain in unspecified limb: Secondary | ICD-10-CM

## 2013-07-13 DIAGNOSIS — L97909 Non-pressure chronic ulcer of unspecified part of unspecified lower leg with unspecified severity: Secondary | ICD-10-CM

## 2013-07-15 DIAGNOSIS — H66009 Acute suppurative otitis media without spontaneous rupture of ear drum, unspecified ear: Secondary | ICD-10-CM | POA: Diagnosis not present

## 2013-07-16 DIAGNOSIS — Z992 Dependence on renal dialysis: Secondary | ICD-10-CM | POA: Diagnosis not present

## 2013-07-16 DIAGNOSIS — N2581 Secondary hyperparathyroidism of renal origin: Secondary | ICD-10-CM | POA: Diagnosis not present

## 2013-07-16 DIAGNOSIS — N186 End stage renal disease: Secondary | ICD-10-CM | POA: Diagnosis not present

## 2013-07-16 DIAGNOSIS — D509 Iron deficiency anemia, unspecified: Secondary | ICD-10-CM | POA: Diagnosis not present

## 2013-07-16 DIAGNOSIS — D631 Anemia in chronic kidney disease: Secondary | ICD-10-CM | POA: Diagnosis not present

## 2013-07-16 DIAGNOSIS — N039 Chronic nephritic syndrome with unspecified morphologic changes: Secondary | ICD-10-CM | POA: Diagnosis not present

## 2013-07-20 ENCOUNTER — Encounter: Payer: Self-pay | Admitting: Vascular Surgery

## 2013-07-21 ENCOUNTER — Encounter: Payer: Medicare Other | Admitting: Vascular Surgery

## 2013-07-21 ENCOUNTER — Encounter (HOSPITAL_COMMUNITY): Payer: Medicare Other

## 2013-07-22 DIAGNOSIS — L97509 Non-pressure chronic ulcer of other part of unspecified foot with unspecified severity: Secondary | ICD-10-CM | POA: Diagnosis not present

## 2013-07-23 ENCOUNTER — Encounter (HOSPITAL_COMMUNITY): Payer: Medicare Other

## 2013-07-23 ENCOUNTER — Encounter: Payer: Medicare Other | Admitting: Vascular Surgery

## 2013-07-26 ENCOUNTER — Encounter: Payer: Self-pay | Admitting: Vascular Surgery

## 2013-07-27 ENCOUNTER — Inpatient Hospital Stay (HOSPITAL_COMMUNITY): Admission: RE | Admit: 2013-07-27 | Payer: Medicare Other | Source: Ambulatory Visit

## 2013-07-27 ENCOUNTER — Encounter: Payer: Medicare Other | Admitting: Vascular Surgery

## 2013-07-27 DIAGNOSIS — I871 Compression of vein: Secondary | ICD-10-CM | POA: Diagnosis not present

## 2013-07-27 DIAGNOSIS — T82898A Other specified complication of vascular prosthetic devices, implants and grafts, initial encounter: Secondary | ICD-10-CM | POA: Diagnosis not present

## 2013-07-27 DIAGNOSIS — N186 End stage renal disease: Secondary | ICD-10-CM | POA: Diagnosis not present

## 2013-08-11 DIAGNOSIS — E1129 Type 2 diabetes mellitus with other diabetic kidney complication: Secondary | ICD-10-CM | POA: Diagnosis not present

## 2013-08-14 DIAGNOSIS — N186 End stage renal disease: Secondary | ICD-10-CM | POA: Diagnosis not present

## 2013-08-16 DIAGNOSIS — D631 Anemia in chronic kidney disease: Secondary | ICD-10-CM | POA: Diagnosis not present

## 2013-08-16 DIAGNOSIS — N186 End stage renal disease: Secondary | ICD-10-CM | POA: Diagnosis not present

## 2013-08-16 DIAGNOSIS — N039 Chronic nephritic syndrome with unspecified morphologic changes: Secondary | ICD-10-CM | POA: Diagnosis not present

## 2013-08-16 DIAGNOSIS — D509 Iron deficiency anemia, unspecified: Secondary | ICD-10-CM | POA: Diagnosis not present

## 2013-08-16 DIAGNOSIS — N2581 Secondary hyperparathyroidism of renal origin: Secondary | ICD-10-CM | POA: Diagnosis not present

## 2013-08-24 ENCOUNTER — Encounter: Payer: Self-pay | Admitting: Vascular Surgery

## 2013-08-25 ENCOUNTER — Ambulatory Visit (INDEPENDENT_AMBULATORY_CARE_PROVIDER_SITE_OTHER): Payer: Medicare Other | Admitting: Vascular Surgery

## 2013-08-25 ENCOUNTER — Encounter: Payer: Self-pay | Admitting: Vascular Surgery

## 2013-08-25 ENCOUNTER — Ambulatory Visit (HOSPITAL_COMMUNITY)
Admission: RE | Admit: 2013-08-25 | Discharge: 2013-08-25 | Disposition: A | Discharge status: Home / Self Care | Payer: Medicare Other | Source: Ambulatory Visit | Attending: Internal Medicine | Admitting: Internal Medicine

## 2013-08-25 VITALS — BP 163/92 | HR 98 | Ht 73.0 in | Wt 281.4 lb

## 2013-08-25 DIAGNOSIS — I10 Essential (primary) hypertension: Secondary | ICD-10-CM | POA: Insufficient documentation

## 2013-08-25 DIAGNOSIS — L98499 Non-pressure chronic ulcer of skin of other sites with unspecified severity: Principal | ICD-10-CM

## 2013-08-25 DIAGNOSIS — E119 Type 2 diabetes mellitus without complications: Secondary | ICD-10-CM | POA: Insufficient documentation

## 2013-08-25 DIAGNOSIS — L97909 Non-pressure chronic ulcer of unspecified part of unspecified lower leg with unspecified severity: Secondary | ICD-10-CM

## 2013-08-25 DIAGNOSIS — F17200 Nicotine dependence, unspecified, uncomplicated: Secondary | ICD-10-CM | POA: Insufficient documentation

## 2013-08-25 DIAGNOSIS — M79609 Pain in unspecified limb: Secondary | ICD-10-CM

## 2013-08-25 DIAGNOSIS — I739 Peripheral vascular disease, unspecified: Secondary | ICD-10-CM | POA: Diagnosis not present

## 2013-08-25 DIAGNOSIS — I7025 Atherosclerosis of native arteries of other extremities with ulceration: Secondary | ICD-10-CM | POA: Insufficient documentation

## 2013-08-25 DIAGNOSIS — L97509 Non-pressure chronic ulcer of other part of unspecified foot with unspecified severity: Secondary | ICD-10-CM | POA: Insufficient documentation

## 2013-08-25 NOTE — Assessment & Plan Note (Signed)
Based on his arterial Doppler study with a toe pressure on 170 mmHg, I think he should have adequate circulation to heal the left toe wound. We have discussed the importance of tobacco cessation. He'll continue to follow up with his foot Dr. And will see him back as needed.

## 2013-08-25 NOTE — Progress Notes (Signed)
[facility] [place]  Patient name: [name] MRN: [id number] DOB: [birth year] Sex: male  REASON FOR CONSULT: Left great toe wound.  HPI: [name] is a 48 y.o. male who developed a blister on his left great toe approximately 2 months ago. He feels that the wound has improved slightly. He states that he has had an x-ray which showed no evidence of osteomyelitis. He denies any significant claudication or rest pain.  His risk factors for peripheral vascular disease include diabetes, hypertension, and history of tobacco use. He also has end-stage renal disease. He dialyzes Monday Wednesdays and Fridays. He smokes 3 cigarettes a day.  Past Medical History  Diagnosis Date  . Diabetes mellitus   . Hypertension   . Chest pain   . Morbid obesity   . Bronchitis   . Blood transfusion   . H/O hiatal hernia   . Chronic kidney disease     d[facility], [facility]  . GERD (gastroesophageal reflux disease)   . Dysrhythmia     irregular heartbeat  . Hyperparathyroidism   . ESRD (end stage renal disease) on dialysis     first HD 06/19/04   Family History  Problem Relation Age of Onset  . Heart disease Mother   . Kidney disease Mother   . Hypertension Mother    SOCIAL HISTORY: History  Substance Use Topics  . Smoking status: Current Some Day Smoker -- 0.10 packs/day for 20 years    Types: Cigarettes  . Smokeless tobacco: Never Used     Comment: pt states he only smokes about 3 cigs per day  . Alcohol Use: No   No Known Allergies Current Outpatient Prescriptions  Medication Sig Dispense Refill  . b complex-vitamin c-folic acid (NEPHRO-VITE) 0.8 MG TABS Take 0.8 mg by mouth at bedtime.        . insulin aspart protamine-insulin aspart (NOVOLOG 70/30) (70-30) 100 UNIT/ML injection Inject 30-50 Units into the skin 3 (three) times daily after meals. Sliding scale.      Marland Kitchen oxyCODONE-acetaminophen (PERCOCET) 10-325 MG per tablet Take 1-2 tablets by mouth  every 4 (four) hours as needed for pain.      . calcium acetate (PHOSLO) 667 MG capsule Take 2,668 mg by mouth 3 (three) times daily with meals.      . clindamycin (CLEOCIN) 150 MG capsule Take 1 capsule (150 mg total) by mouth every 6 (six) hours.  28 capsule  0   No current facility-administered medications for this visit.   REVIEW OF SYSTEMS: Valu.Nieves ] denotes positive finding; [  ] denotes negative finding  CARDIOVASCULAR:  [ ]  chest pain   [ ]  chest pressure   [ ]  palpitations   [ ]  orthopnea   [ ]  dyspnea on exertion   [ ]  claudication   [ ]  rest pain   [ ]  DVT   [ ]  phlebitis PULMONARY:   [ ]  productive cough   [ ]  asthma   [ ]  wheezing NEUROLOGIC:   [ ]  weakness  [ ]  paresthesias  [ ]  aphasia  [ ]  amaurosis  [ ]  dizziness HEMATOLOGIC:   [ ]  bleeding problems   [ ]  clotting disorders MUSCULOSKELETAL:  [ ]  joint pain   [ ]  joint swelling [ ]  leg swelling GASTROINTESTINAL: [ ]   blood in stool  [ ]   hematemesis GENITOURINARY:  [ ]   dysuria  [ ]   hematuria PSYCHIATRIC:  [ ]  history of major depression INTEGUMENTARY:  [ ]   rashes  [ ]  ulcers CONSTITUTIONAL:  [ ]  fever   [ ]  chills  PHYSICAL EXAM: Filed Vitals:   08/25/13 1034  BP: 163/92  Pulse: 98  Height: 6\' 1"  (1.854 m)  Weight: 281 lb 6.4 oz (127.642 kg)  SpO2: 100%   Body mass index is 37.13 kg/(m^2). GENERAL: The patient is a well-nourished male, in no acute distress. The vital signs are documented above. CARDIOVASCULAR: There is a regular rate and rhythm. I do not detect carotid bruits. He has palpable femoral pulses. I cannot palpate pedal pulses. He has no significant lower extremity swelling. PULMONARY: There is good air exchange bilaterally without wheezing or rales. ABDOMEN: Soft and non-tender with normal pitched bowel sounds.  MUSCULOSKELETAL: There are no major deformities or cyanosis. NEUROLOGIC: No focal weakness or paresthesias are detected. SKIN: He has a small wound on the end of his left great toe. PSYCHIATRIC:  The patient has a normal affect.  DATA:  Hemoglobin A1c had gone as high as greater than 9 but is down to 8 reportedly.  I have independently interpreted his arterial Doppler study which shows biphasic dorsalis pedis and posterior tibial flow on the right. He has a triphasic left posterior tibial signal and a biphasic left dorsalis pedis signal. ABIs on the left could not be obtained as his vessels were noncompressible however his digital pressure on the left was 117 mm of mercury.  MEDICAL ISSUES:  Atherosclerosis of native arteries of the extremities with ulceration(440.23) Based on his arterial Doppler study with a toe pressure on 170 mmHg, I think he should have adequate circulation to heal the left toe wound. We have discussed the importance of tobacco cessation. He'll continue to follow up with his foot Dr. And will see him back as needed.   [place] [facility] [place]: [phone]

## 2013-08-27 DIAGNOSIS — L97509 Non-pressure chronic ulcer of other part of unspecified foot with unspecified severity: Secondary | ICD-10-CM | POA: Diagnosis not present

## 2013-09-11 DIAGNOSIS — N186 End stage renal disease: Secondary | ICD-10-CM | POA: Diagnosis not present

## 2013-09-13 DIAGNOSIS — N186 End stage renal disease: Secondary | ICD-10-CM | POA: Diagnosis not present

## 2013-09-13 DIAGNOSIS — D509 Iron deficiency anemia, unspecified: Secondary | ICD-10-CM | POA: Diagnosis not present

## 2013-09-13 DIAGNOSIS — D631 Anemia in chronic kidney disease: Secondary | ICD-10-CM | POA: Diagnosis not present

## 2013-09-13 DIAGNOSIS — N2581 Secondary hyperparathyroidism of renal origin: Secondary | ICD-10-CM | POA: Diagnosis not present

## 2013-10-12 DIAGNOSIS — N186 End stage renal disease: Secondary | ICD-10-CM | POA: Diagnosis not present

## 2013-10-13 DIAGNOSIS — N186 End stage renal disease: Secondary | ICD-10-CM | POA: Diagnosis not present

## 2013-10-13 DIAGNOSIS — N2581 Secondary hyperparathyroidism of renal origin: Secondary | ICD-10-CM | POA: Diagnosis not present

## 2013-10-13 DIAGNOSIS — D509 Iron deficiency anemia, unspecified: Secondary | ICD-10-CM | POA: Diagnosis not present

## 2013-10-13 DIAGNOSIS — E119 Type 2 diabetes mellitus without complications: Secondary | ICD-10-CM | POA: Diagnosis not present

## 2013-10-13 DIAGNOSIS — D631 Anemia in chronic kidney disease: Secondary | ICD-10-CM | POA: Diagnosis not present

## 2013-10-14 DIAGNOSIS — E1129 Type 2 diabetes mellitus with other diabetic kidney complication: Secondary | ICD-10-CM | POA: Diagnosis not present

## 2013-10-14 DIAGNOSIS — N186 End stage renal disease: Secondary | ICD-10-CM | POA: Diagnosis not present

## 2013-10-14 DIAGNOSIS — Z6841 Body Mass Index (BMI) 40.0 and over, adult: Secondary | ICD-10-CM | POA: Diagnosis not present

## 2013-10-14 DIAGNOSIS — E1165 Type 2 diabetes mellitus with hyperglycemia: Secondary | ICD-10-CM | POA: Diagnosis not present

## 2013-10-14 DIAGNOSIS — E669 Obesity, unspecified: Secondary | ICD-10-CM | POA: Diagnosis not present

## 2013-10-14 DIAGNOSIS — F17200 Nicotine dependence, unspecified, uncomplicated: Secondary | ICD-10-CM | POA: Diagnosis not present

## 2013-11-11 DIAGNOSIS — N186 End stage renal disease: Secondary | ICD-10-CM | POA: Diagnosis not present

## 2013-11-12 DIAGNOSIS — N186 End stage renal disease: Secondary | ICD-10-CM | POA: Diagnosis not present

## 2013-11-12 DIAGNOSIS — R197 Diarrhea, unspecified: Secondary | ICD-10-CM | POA: Insufficient documentation

## 2013-11-12 DIAGNOSIS — N039 Chronic nephritic syndrome with unspecified morphologic changes: Secondary | ICD-10-CM | POA: Diagnosis not present

## 2013-11-12 DIAGNOSIS — D631 Anemia in chronic kidney disease: Secondary | ICD-10-CM | POA: Diagnosis not present

## 2013-11-12 DIAGNOSIS — D509 Iron deficiency anemia, unspecified: Secondary | ICD-10-CM | POA: Diagnosis not present

## 2013-11-12 DIAGNOSIS — N2581 Secondary hyperparathyroidism of renal origin: Secondary | ICD-10-CM | POA: Diagnosis not present

## 2013-11-12 DIAGNOSIS — E119 Type 2 diabetes mellitus without complications: Secondary | ICD-10-CM | POA: Diagnosis not present

## 2013-11-29 DIAGNOSIS — T82898A Other specified complication of vascular prosthetic devices, implants and grafts, initial encounter: Secondary | ICD-10-CM | POA: Diagnosis not present

## 2013-11-29 DIAGNOSIS — I871 Compression of vein: Secondary | ICD-10-CM | POA: Diagnosis not present

## 2013-11-29 DIAGNOSIS — N186 End stage renal disease: Secondary | ICD-10-CM | POA: Diagnosis not present

## 2013-12-01 ENCOUNTER — Encounter: Payer: Self-pay | Admitting: Vascular Surgery

## 2013-12-01 DIAGNOSIS — E1129 Type 2 diabetes mellitus with other diabetic kidney complication: Secondary | ICD-10-CM | POA: Diagnosis not present

## 2013-12-02 ENCOUNTER — Ambulatory Visit: Payer: Medicare Other | Admitting: Vascular Surgery

## 2013-12-06 DIAGNOSIS — E1151 Type 2 diabetes mellitus with diabetic peripheral angiopathy without gangrene: Secondary | ICD-10-CM | POA: Insufficient documentation

## 2013-12-07 ENCOUNTER — Telehealth: Payer: Self-pay | Admitting: *Deleted

## 2013-12-07 NOTE — Telephone Encounter (Signed)
[name] called to report that he had a declot procedure by [name] at [facility] last week of his RUA AVG. He was asking about Coumadin therapy but since his message to [place] at 10:11 am,  he has called the appropriate offices for this management ([name], [name]).  He has an appt with [name] on 12-16-13 to discuss another access ( he is currently using a HD cath in his Thigh without any problems). He will update this meds at this appt to include his Coumadin therapy.

## 2013-12-07 NOTE — Telephone Encounter (Signed)
Message copied by Mena Goes on Tue Dec 07, 2013 11:17 AM ------      Message from: Jeronimo Norma      Created: Tue Dec 07, 2013 10:11 AM      Contact: [place] called  with questions regarding set up starting on his coumadin for blood clot.please call [phone] ------

## 2013-12-08 DIAGNOSIS — D689 Coagulation defect, unspecified: Secondary | ICD-10-CM | POA: Insufficient documentation

## 2013-12-12 DIAGNOSIS — N186 End stage renal disease: Secondary | ICD-10-CM | POA: Diagnosis not present

## 2013-12-13 DIAGNOSIS — R509 Fever, unspecified: Secondary | ICD-10-CM | POA: Diagnosis not present

## 2013-12-13 DIAGNOSIS — E119 Type 2 diabetes mellitus without complications: Secondary | ICD-10-CM | POA: Diagnosis not present

## 2013-12-13 DIAGNOSIS — I82409 Acute embolism and thrombosis of unspecified deep veins of unspecified lower extremity: Secondary | ICD-10-CM | POA: Insufficient documentation

## 2013-12-13 DIAGNOSIS — D631 Anemia in chronic kidney disease: Secondary | ICD-10-CM | POA: Diagnosis not present

## 2013-12-13 DIAGNOSIS — D509 Iron deficiency anemia, unspecified: Secondary | ICD-10-CM | POA: Diagnosis not present

## 2013-12-13 DIAGNOSIS — N186 End stage renal disease: Secondary | ICD-10-CM | POA: Diagnosis not present

## 2013-12-13 DIAGNOSIS — N2581 Secondary hyperparathyroidism of renal origin: Secondary | ICD-10-CM | POA: Diagnosis not present

## 2013-12-14 ENCOUNTER — Encounter: Payer: Self-pay | Admitting: Family

## 2013-12-14 ENCOUNTER — Encounter: Payer: Self-pay | Admitting: Vascular Surgery

## 2013-12-15 ENCOUNTER — Encounter: Payer: Self-pay | Admitting: Vascular Surgery

## 2013-12-15 DIAGNOSIS — I749 Embolism and thrombosis of unspecified artery: Secondary | ICD-10-CM | POA: Diagnosis not present

## 2013-12-15 DIAGNOSIS — R509 Fever, unspecified: Secondary | ICD-10-CM | POA: Diagnosis present

## 2013-12-16 ENCOUNTER — Ambulatory Visit (INDEPENDENT_AMBULATORY_CARE_PROVIDER_SITE_OTHER): Payer: Medicare Other | Admitting: Vascular Surgery

## 2013-12-16 ENCOUNTER — Encounter: Payer: Self-pay | Admitting: Vascular Surgery

## 2013-12-16 ENCOUNTER — Other Ambulatory Visit: Payer: Self-pay

## 2013-12-16 VITALS — BP 155/79 | HR 100 | Ht 73.0 in | Wt 276.0 lb

## 2013-12-16 DIAGNOSIS — N186 End stage renal disease: Secondary | ICD-10-CM

## 2013-12-16 NOTE — Progress Notes (Addendum)
[facility] [place] HISTORY AND PHYSICAL   History of Present Illness:  Patient is a 48 y.o. year old male who presents for placement of a permanent hemodialysis access. The patient has had multiple prior access procedures. His most recent left upper extremity access procedure with the basilic vein transposition. On attempted thrombectomy the central veins were clinically occluded.  He has had multiple prior access procedures in the right arm and also innominate vein angioplasty on the right side as recently as 2012. He currently is dialyzing via a left femoral catheter. His most recent access on the right side is occluded. He denies any claudication symptoms. But does have a chronic nonhealing wound of his left first toe and has a Charcot ankle and foot on the right side.  Past Medical History  Diagnosis Date  . Diabetes mellitus   . Hypertension   . Chest pain   . Morbid obesity   . Bronchitis   . Blood transfusion   . H/O hiatal hernia   . Chronic kidney disease     dialysis, [facility]  . GERD (gastroesophageal reflux disease)   . Dysrhythmia     irregular heartbeat  . Hyperparathyroidism   . ESRD (end stage renal disease) on dialysis     first HD 06/19/04    Past Surgical History  Procedure Laterality Date  . Av fistula placement  10/11/2010  . Ankle fusion      rt foot charcot joint 2008 with refusion  . Av fistula placement  07/02/2011    Procedure: INSERTION OF ARTERIOVENOUS (AV) GORE-TEX GRAFT ARM;  Surgeon: Elam Dutch, MD;  Location: [place];  Service: Vascular;  Laterality: Left;  . Thrombectomy    . Insertion of dialysis catheter  01/20/2012    Procedure: INSERTION OF DIALYSIS CATHETER;  Surgeon: Serafina Mitchell, MD;  Location: [place];  Service: Vascular;  Laterality: N/A;  insertion left internal jugular dialysis catheter 23cm  . Umbilcial hernia    . Parathyroidectomy    . Avgg removal Right 11/06/2012    Procedure: REMOVAL OF ARTERIOVENOUS  GORETEX GRAFT ([place]);  Surgeon: Mal Misty, MD;  Location: [place];  Service: Vascular;  Laterality: Right;  . Av fistula placement Right 11/06/2012    Procedure: INSERTION OF ARTERIOVENOUS (AV) GORE-TEX GRAFT ARM;  Surgeon: Mal Misty, MD;  Location: [place];  Service: Vascular;  Laterality: Right;  . Insertion of dialysis catheter Right 11/09/2012    Procedure: INSERTION OF DIALYSIS CATHETER;  Surgeon: Conrad Waurika, MD;  Location: [facility] OR;  Service: Vascular;  Laterality: Right;  Right  Femoral Vein     Social History History  Substance Use Topics  . Smoking status: Current Some Day Smoker -- 0.10 packs/day for 20 years    Types: Cigarettes  . Smokeless tobacco: Never Used     Comment: pt states he only smokes about 3 cigs per day  . Alcohol Use: No    Family History Family History  Problem Relation Age of Onset  . Heart disease Mother   . Kidney disease Mother   . Hypertension Mother     Allergies  No Known Allergies   Current Outpatient Prescriptions  Medication Sig Dispense Refill  . b complex-vitamin c-folic acid (NEPHRO-VITE) 0.8 MG TABS Take 0.8 mg by mouth at bedtime.        . BD PEN NEEDLE NANO U/F 32G X 4 MM MISC       . calcium acetate (  PHOSLO) 667 MG capsule Take 2,668 mg by mouth 3 (three) times daily with meals.      . clindamycin (CLEOCIN) 150 MG capsule Take 1 capsule (150 mg total) by mouth every 6 (six) hours.  28 capsule  0  . LEVEMIR FLEXTOUCH 100 UNIT/ML Pen       . repaglinide (PRANDIN) 0.5 MG tablet Take 0.5 mg by mouth 2 (two) times daily before a meal.       . warfarin (COUMADIN) 5 MG tablet Take 5 mg by mouth daily.       . insulin aspart protamine-insulin aspart (NOVOLOG 70/30) (70-30) 100 UNIT/ML injection Inject 30-50 Units into the skin 3 (three) times daily after meals. Sliding scale.      Marland Kitchen oxyCODONE-acetaminophen (PERCOCET) 10-325 MG per tablet Take 1-2 tablets by mouth every 4 (four) hours as needed for pain.       No current  facility-administered medications for this visit.    ROS:   General:  No weight loss, Fever, chills  HEENT: No recent headaches, no nasal bleeding, no visual changes, no sore throat  Neurologic: No dizziness, blackouts, seizures. No recent symptoms of stroke or mini- stroke. No recent episodes of slurred speech, or temporary blindness.  Cardiac: No recent episodes of chest pain/pressure, no shortness of breath at rest.  No shortness of breath with exertion.  Denies history of atrial fibrillation or irregular heartbeat  Vascular: No history of rest pain in feet.  No history of claudication.  No history of non-healing ulcer, No history of DVT   Pulmonary: No home oxygen, no productive cough, no hemoptysis,  No asthma or wheezing  Musculoskeletal:  [ ]  Arthritis, [ ]  Low back pain,  [ ]  Joint pain  Hematologic:No history of hypercoagulable state.  No history of easy bleeding.  No history of anemia  Gastrointestinal: No hematochezia or melena,  No gastroesophageal reflux, no trouble swallowing  Urinary: [ ]  chronic Kidney disease, [ ]  on HD - [ ]  MWF or [ ]  TTHS, [ ]  Burning with urination, [ ]  Frequent urination, [ ]  Difficulty urinating;   Skin: No rashes  Psychological: No history of anxiety,  No history of depression   Physical Examination  Filed Vitals:   12/16/13 1105  BP: 155/79  Pulse: 100  Height: 6\' 1"  (1.854 m)  Weight: 276 lb (125.193 kg)  SpO2: 100%    Body mass index is 36.42 kg/(m^2).  General:  Alert and oriented, no acute distress HEENT: Normal Neck: No bruit or JVD Pulmonary: Clear to auscultation bilaterally Cardiac: Regular Rate and Rhythm without murmur Gastrointestinal: Soft, non-tender, non-distended, no mass, obese Skin: No rash Extremity Pulses:  1+ radial, brachial pulses bilaterally, 2+ femoral pulses absent popliteal and pedal pulses bilaterally Musculoskeletal: No deformity or edema  Neurologic: Upper and lower extremity motor 5/5 and  symmetric  ASSESSMENT:   Difficult access patient multiple failures in the upper extremities. 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   PLAN:  We'll schedule patient for bilateral central venogram to see if he might be a candidate for an upper extremity HERO graft. If his central veins are severely diseased or occluded we will need to consider a right thigh graft. The patient is on Coumadin. I do not believe we need to stop this for his central venogram. However we will need to determine whether or not to bridge him or stop his Coumadin prior to HERO or thigh graft placement.  Ruta Hinds, MD [facility] [place] Office: [phone] Pager: [phone]

## 2013-12-20 ENCOUNTER — Other Ambulatory Visit: Payer: Self-pay

## 2013-12-22 DIAGNOSIS — I749 Embolism and thrombosis of unspecified artery: Secondary | ICD-10-CM | POA: Diagnosis not present

## 2013-12-22 MED ORDER — SODIUM CHLORIDE 0.9 % IJ SOLN: 3.0000 mL | INTRAMUSCULAR | Status: DC | PRN

## 2013-12-23 ENCOUNTER — Ambulatory Visit (HOSPITAL_COMMUNITY): Admission: RE | Admit: 2013-12-23 | Payer: Medicare Other | Source: Ambulatory Visit | Admitting: Vascular Surgery

## 2013-12-23 ENCOUNTER — Encounter (HOSPITAL_COMMUNITY): Admission: RE | Payer: Self-pay | Source: Ambulatory Visit

## 2013-12-23 SURGERY — VENOGRAM
Anesthesia: LOCAL | Laterality: Bilateral

## 2013-12-28 DIAGNOSIS — E441 Mild protein-calorie malnutrition: Secondary | ICD-10-CM | POA: Insufficient documentation

## 2013-12-29 DIAGNOSIS — R7881 Bacteremia: Secondary | ICD-10-CM | POA: Diagnosis not present

## 2013-12-29 DIAGNOSIS — I749 Embolism and thrombosis of unspecified artery: Secondary | ICD-10-CM | POA: Diagnosis not present

## 2014-01-05 DIAGNOSIS — I749 Embolism and thrombosis of unspecified artery: Secondary | ICD-10-CM | POA: Diagnosis not present

## 2014-01-06 ENCOUNTER — Encounter (HOSPITAL_COMMUNITY)
Admission: RE | Disposition: A | Discharge status: Home / Self Care | Payer: Self-pay | Source: Ambulatory Visit | Attending: Vascular Surgery

## 2014-01-06 ENCOUNTER — Ambulatory Visit (HOSPITAL_COMMUNITY)
Admission: RE | Admit: 2014-01-06 | Discharge: 2014-01-06 | Disposition: A | Discharge status: Home / Self Care | Payer: Medicare Other | Source: Ambulatory Visit | Attending: Vascular Surgery | Admitting: Vascular Surgery

## 2014-01-06 DIAGNOSIS — F17200 Nicotine dependence, unspecified, uncomplicated: Secondary | ICD-10-CM | POA: Diagnosis not present

## 2014-01-06 DIAGNOSIS — N185 Chronic kidney disease, stage 5: Secondary | ICD-10-CM

## 2014-01-06 DIAGNOSIS — K219 Gastro-esophageal reflux disease without esophagitis: Secondary | ICD-10-CM | POA: Insufficient documentation

## 2014-01-06 DIAGNOSIS — Z7901 Long term (current) use of anticoagulants: Secondary | ICD-10-CM | POA: Diagnosis not present

## 2014-01-06 DIAGNOSIS — E119 Type 2 diabetes mellitus without complications: Secondary | ICD-10-CM | POA: Insufficient documentation

## 2014-01-06 DIAGNOSIS — E213 Hyperparathyroidism, unspecified: Secondary | ICD-10-CM | POA: Insufficient documentation

## 2014-01-06 DIAGNOSIS — I120 Hypertensive chronic kidney disease with stage 5 chronic kidney disease or end stage renal disease: Secondary | ICD-10-CM | POA: Insufficient documentation

## 2014-01-06 DIAGNOSIS — Z992 Dependence on renal dialysis: Secondary | ICD-10-CM | POA: Insufficient documentation

## 2014-01-06 DIAGNOSIS — N186 End stage renal disease: Secondary | ICD-10-CM | POA: Diagnosis not present

## 2014-01-06 DIAGNOSIS — I499 Cardiac arrhythmia, unspecified: Secondary | ICD-10-CM | POA: Diagnosis not present

## 2014-01-06 DIAGNOSIS — Z794 Long term (current) use of insulin: Secondary | ICD-10-CM | POA: Diagnosis not present

## 2014-01-06 DIAGNOSIS — Z6836 Body mass index (BMI) 36.0-36.9, adult: Secondary | ICD-10-CM | POA: Insufficient documentation

## 2014-01-06 HISTORY — PX: VENOGRAM: SHX5497

## 2014-01-06 LAB — POCT I-STAT, CHEM 8
BUN: 33 mg/dL — ABNORMAL HIGH (ref 6–23)
Calcium, Ion: 1.04 mmol/L — ABNORMAL LOW (ref 1.12–1.23)
Chloride: 100 mEq/L (ref 96–112)
Creatinine, Ser: 9.1 mg/dL — ABNORMAL HIGH (ref 0.50–1.35)
GLUCOSE: 101 mg/dL — AB (ref 70–99)
HEMATOCRIT: 33 % — AB (ref 39.0–52.0)
HEMOGLOBIN: 11.2 g/dL — AB (ref 13.0–17.0)
Potassium: 4 mEq/L (ref 3.7–5.3)
SODIUM: 141 meq/L (ref 137–147)
TCO2: 25 mmol/L (ref 0–100)

## 2014-01-06 LAB — PROTIME-INR
INR: 1.23 (ref 0.00–1.49)
PROTHROMBIN TIME: 15.5 s — AB (ref 11.6–15.2)

## 2014-01-06 LAB — GLUCOSE, CAPILLARY
Glucose-Capillary: 94 mg/dL (ref 70–99)
Glucose-Capillary: 92 mg/dL (ref 70–99)

## 2014-01-06 SURGERY — VENOGRAM
Anesthesia: LOCAL

## 2014-01-06 MED ORDER — SODIUM CHLORIDE 0.9 % IJ SOLN: 3.0000 mL | Freq: Two times a day (BID) | INTRAMUSCULAR | Status: DC

## 2014-01-06 MED ORDER — SODIUM CHLORIDE 0.9 % IV SOLN: 250.0000 mL | INTRAVENOUS | Status: DC | PRN

## 2014-01-06 MED ORDER — SODIUM CHLORIDE 0.9 % IJ SOLN: 3.0000 mL | INTRAMUSCULAR | Status: DC | PRN

## 2014-01-06 MED ORDER — ACETAMINOPHEN 325 MG PO TABS: 650.0000 mg | ORAL_TABLET | ORAL | Status: DC | PRN

## 2014-01-06 NOTE — Discharge Instructions (Signed)
Venogram, Care After °Refer to this sheet in the next few weeks. These instructions provide you with information on caring for yourself after your procedure. Your health care provider may also give you more specific instructions. Your treatment has been planned according to current medical practices, but problems sometimes occur. Call your health care provider if you have any problems or questions after your procedure. °WHAT TO EXPECT AFTER THE PROCEDURE °After your procedure, it is typical to have the following sensations: °· Mild discomfort at the catheter insertion site. °HOME CARE INSTRUCTIONS  °· Take all medicines exactly as directed. °· Follow any prescribed diet. °· Follow instructions regarding both rest and physical activity. °· Drink more fluids for the first several days after the procedure in order to help flush dye from your kidneys. °SEEK MEDICAL CARE IF: °· You develop a rash. °· You have fever not controlled by medicine. °SEEK IMMEDIATE MEDICAL CARE IF: °· There is pain, drainage, bleeding, redness, swelling, warmth or a red streak at the site of the IV tube. °· The extremity where your IV tube was placed becomes discolored, numb, or cool. °· You have difficulty breathing or shortness of breath. °· You develop chest pain. °· You have excessive dizziness or fainting. °Document Released: 04/21/2013 Document Revised: 07/06/2013 Document Reviewed: 04/21/2013 °ExitCare® Patient Information ©2015 [employer]. This information is not intended to replace advice given to you by your health care provider. Make sure you discuss any questions you have with your health care provider. ° °

## 2014-01-06 NOTE — H&P (View-Only) (Signed)
[facility] [place] HISTORY AND PHYSICAL   History of Present Illness:  Patient is a 48 y.o. year old male who presents for placement of a permanent hemodialysis access. The patient has had multiple prior access procedures. His most recent left upper extremity access procedure with the basilic vein transposition. On attempted thrombectomy the central veins were clinically occluded.  He has had multiple prior access procedures in the right arm and also innominate vein angioplasty on the right side as recently as 2012. He currently is dialyzing via a left femoral catheter. His most recent access on the right side is occluded. He denies any claudication symptoms. But does have a chronic nonhealing wound of his left first toe and has a Charcot ankle and foot on the right side.  Past Medical History  Diagnosis Date  . Diabetes mellitus   . Hypertension   . Chest pain   . Morbid obesity   . Bronchitis   . Blood transfusion   . H/O hiatal hernia   . Chronic kidney disease     d[facility], [facility]  . GERD (gastroesophageal reflux disease)   . Dysrhythmia     irregular heartbeat  . Hyperparathyroidism   . ESRD (end stage renal disease) on dialysis     first HD 06/19/04    Past Surgical History  Procedure Laterality Date  . Av fistula placement  10/11/2010  . Ankle fusion      rt foot charcot joint 2008 with refusion  . Av fistula placement  07/02/2011    Procedure: INSERTION OF ARTERIOVENOUS (AV) GORE-TEX GRAFT ARM;  Surgeon: Elam Dutch, MD;  Location: [place];  Service: Vascular;  Laterality: Left;  . Thrombectomy    . Insertion of dialysis catheter  01/20/2012    Procedure: INSERTION OF DIALYSIS CATHETER;  Surgeon: Serafina Mitchell, MD;  Location: [place];  Service: Vascular;  Laterality: N/A;  insertion left internal jugular dialysis catheter 23cm  . Umbilcial hernia    . Parathyroidectomy    . Avgg removal Right 11/06/2012    Procedure: REMOVAL OF ARTERIOVENOUS  GORETEX GRAFT ([place]);  Surgeon: Mal Misty, MD;  Location: [place];  Service: Vascular;  Laterality: Right;  . Av fistula placement Right 11/06/2012    Procedure: INSERTION OF ARTERIOVENOUS (AV) GORE-TEX GRAFT ARM;  Surgeon: Mal Misty, MD;  Location: [place];  Service: Vascular;  Laterality: Right;  . Insertion of dialysis catheter Right 11/09/2012    Procedure: INSERTION OF DIALYSIS CATHETER;  Surgeon: Conrad Scottsburg, MD;  Location: [facility] OR;  Service: Vascular;  Laterality: Right;  Right  Femoral Vein     Social History History  Substance Use Topics  . Smoking status: Current Some Day Smoker -- 0.10 packs/day for 20 years    Types: Cigarettes  . Smokeless tobacco: Never Used     Comment: pt states he only smokes about 3 cigs per day  . Alcohol Use: No    Family History Family History  Problem Relation Age of Onset  . Heart disease Mother   . Kidney disease Mother   . Hypertension Mother     Allergies  No Known Allergies   Current Outpatient Prescriptions  Medication Sig Dispense Refill  . b complex-vitamin c-folic acid (NEPHRO-VITE) 0.8 MG TABS Take 0.8 mg by mouth at bedtime.        . BD PEN NEEDLE NANO U/F 32G X 4 MM MISC       . calcium acetate (  PHOSLO) 667 MG capsule Take 2,668 mg by mouth 3 (three) times daily with meals.      . clindamycin (CLEOCIN) 150 MG capsule Take 1 capsule (150 mg total) by mouth every 6 (six) hours.  28 capsule  0  . LEVEMIR FLEXTOUCH 100 UNIT/ML Pen       . repaglinide (PRANDIN) 0.5 MG tablet Take 0.5 mg by mouth 2 (two) times daily before a meal.       . warfarin (COUMADIN) 5 MG tablet Take 5 mg by mouth daily.       . insulin aspart protamine-insulin aspart (NOVOLOG 70/30) (70-30) 100 UNIT/ML injection Inject 30-50 Units into the skin 3 (three) times daily after meals. Sliding scale.      Marland Kitchen oxyCODONE-acetaminophen (PERCOCET) 10-325 MG per tablet Take 1-2 tablets by mouth every 4 (four) hours as needed for pain.       No current  facility-administered medications for this visit.    ROS:   General:  No weight loss, Fever, chills  HEENT: No recent headaches, no nasal bleeding, no visual changes, no sore throat  Neurologic: No dizziness, blackouts, seizures. No recent symptoms of stroke or mini- stroke. No recent episodes of slurred speech, or temporary blindness.  Cardiac: No recent episodes of chest pain/pressure, no shortness of breath at rest.  No shortness of breath with exertion.  Denies history of atrial fibrillation or irregular heartbeat  Vascular: No history of rest pain in feet.  No history of claudication.  No history of non-healing ulcer, No history of DVT   Pulmonary: No home oxygen, no productive cough, no hemoptysis,  No asthma or wheezing  Musculoskeletal:  [ ]  Arthritis, [ ]  Low back pain,  [ ]  Joint pain  Hematologic:No history of hypercoagulable state.  No history of easy bleeding.  No history of anemia  Gastrointestinal: No hematochezia or melena,  No gastroesophageal reflux, no trouble swallowing  Urinary: [ ]  chronic Kidney disease, [ ]  on HD - [ ]  MWF or [ ]  TTHS, [ ]  Burning with urination, [ ]  Frequent urination, [ ]  Difficulty urinating;   Skin: No rashes  Psychological: No history of anxiety,  No history of depression   Physical Examination  Filed Vitals:   12/16/13 1105  BP: 155/79  Pulse: 100  Height: 6\' 1"  (1.854 m)  Weight: 276 lb (125.193 kg)  SpO2: 100%    Body mass index is 36.42 kg/(m^2).  General:  Alert and oriented, no acute distress HEENT: Normal Neck: No bruit or JVD Pulmonary: Clear to auscultation bilaterally Cardiac: Regular Rate and Rhythm without murmur Gastrointestinal: Soft, non-tender, non-distended, no mass, obese Skin: No rash Extremity Pulses:  1+ radial, brachial pulses bilaterally, 2+ femoral pulses absent popliteal and pedal pulses bilaterally Musculoskeletal: No deformity or edema  Neurologic: Upper and lower extremity motor 5/5 and  symmetric  ASSESSMENT:   Difficult access patient multiple failures in the upper extremities. 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   PLAN:  We'll schedule patient for bilateral central venogram to see if he might be a candidate for an upper extremity HERO graft. If his central veins are severely diseased or occluded we will need to consider a right thigh graft. The patient is on Coumadin. I do not believe we need to stop this for his central venogram. However we will need to determine whether or not to bridge him or stop his Coumadin prior to HERO or thigh graft placement.  Ruta Hinds, MD [facility] [place] Office: [phone] Pager: [phone]

## 2014-01-06 NOTE — Interval H&P Note (Signed)
[facility] [employer]  History and Physical Update  The patient was interviewed and re-examined.  The patient's previous History and Physical has been reviewed and is unchanged from Dr. Nona Dell consult.  There is no change in the plan of care: B arm and central venogram.  Adele Barthel, MD [facility] Office: [phone] Pager: [phone]  01/06/2014, 7:29 AM

## 2014-01-06 NOTE — Op Note (Signed)
    OPERATIVE NOTE   PROCEDURE: 1. bilateral arm and central venogram   PRE-OPERATIVE DIAGNOSIS: end stage renal disease  POST-OPERATIVE DIAGNOSIS: same as above   SURGEON: Adele Barthel, MD  ANESTHESIA: local  ESTIMATED BLOOD LOSS: 5 cc  FINDING(S): 1. Occluded named veins in right arm with non-filling of subclavian vein and left innominate vein 2. Right arm drains via chest collateral veins 3. Multiple occluded stents including an occluded left subclavian vein stent 4. Left brachial vein, axillary, and subclavian vein patent  5. Suggestion that left innominate vein is patent  SPECIMEN(S):  None  CONTRAST: 50 cc  INDICATIONS: [name] is a 48 y.o. male who  presents with end stage renal disease.  The patient is scheduled for bilateral arm and central venogram to help determine the availability of proximal veins for permanent access placement.  The patient is aware the risks include but are not limited to: bleeding, infection, thrombosis of the cannulated access, and possible anaphylactic reaction to the contrast.  The patient is aware of the risks of the procedure and elects to proceed forward.  DESCRIPTION: After full informed written consent was obtained, the patient was brought back to the angiography suite and placed supine upon the angiography table.  The patient was connected to monitoring equipment.  The right antecubital IV was connected to IV extension tubing.  Hand injections were completed to image the arm veins and central venous structures, the findings of which are listed above.  The left forearm IV was then connected to IV extension tubing.  Hand injections were completed to image the arm veins and central venous structures, the findings of which are listed above.    Based on the images, the patient may be a candidate for a hybrid AVG vs HeRO graft-catheter placement.  COMPLICATIONS: none  CONDITION: stable  Adele Barthel, MD [facility] [place] Office: [phone] Pager: [phone]  01/06/2014 7:42 AM

## 2014-01-07 ENCOUNTER — Telehealth: Payer: Self-pay | Admitting: Vascular Surgery

## 2014-01-07 DIAGNOSIS — E669 Obesity, unspecified: Secondary | ICD-10-CM | POA: Diagnosis not present

## 2014-01-07 DIAGNOSIS — Z23 Encounter for immunization: Secondary | ICD-10-CM | POA: Diagnosis not present

## 2014-01-07 DIAGNOSIS — N186 End stage renal disease: Secondary | ICD-10-CM | POA: Diagnosis not present

## 2014-01-07 DIAGNOSIS — Z6841 Body Mass Index (BMI) 40.0 and over, adult: Secondary | ICD-10-CM | POA: Diagnosis not present

## 2014-01-07 DIAGNOSIS — F17200 Nicotine dependence, unspecified, uncomplicated: Secondary | ICD-10-CM | POA: Diagnosis not present

## 2014-01-07 DIAGNOSIS — E1129 Type 2 diabetes mellitus with other diabetic kidney complication: Secondary | ICD-10-CM | POA: Diagnosis not present

## 2014-01-07 NOTE — Telephone Encounter (Signed)
01/07/14: lm for pt re appt, advised that [facility] was out until the 23rd- but to call if he has any issues before then, dpm

## 2014-01-07 NOTE — Telephone Encounter (Signed)
Message copied by Gena Fray on Fri Jan 07, 2014  4:07 PM ------      Message from: Denman George      Created: Fri Jan 07, 2014  9:51 AM      Regarding: Micheline Rough; f/u appt needed                   ----- Message -----         From: Conrad Villalba, MD         Sent: 01/06/2014   7:46 AM           To: Lynetta Mare Pullins, RN            [name]      [id number]      03/26/1966            PROCEDURE:      1. bilateral arm and central venogram             Follow-up: 2 weeks w/ Dr. Oneida Alar       ------

## 2014-01-11 DIAGNOSIS — N186 End stage renal disease: Secondary | ICD-10-CM | POA: Diagnosis not present

## 2014-01-12 DIAGNOSIS — I749 Embolism and thrombosis of unspecified artery: Secondary | ICD-10-CM | POA: Diagnosis not present

## 2014-01-12 DIAGNOSIS — D631 Anemia in chronic kidney disease: Secondary | ICD-10-CM | POA: Diagnosis not present

## 2014-01-12 DIAGNOSIS — N186 End stage renal disease: Secondary | ICD-10-CM | POA: Diagnosis not present

## 2014-01-12 DIAGNOSIS — N2581 Secondary hyperparathyroidism of renal origin: Secondary | ICD-10-CM | POA: Diagnosis not present

## 2014-01-12 DIAGNOSIS — T82898A Other specified complication of vascular prosthetic devices, implants and grafts, initial encounter: Secondary | ICD-10-CM | POA: Diagnosis not present

## 2014-01-12 DIAGNOSIS — N039 Chronic nephritic syndrome with unspecified morphologic changes: Secondary | ICD-10-CM | POA: Diagnosis not present

## 2014-01-12 DIAGNOSIS — D509 Iron deficiency anemia, unspecified: Secondary | ICD-10-CM | POA: Diagnosis not present

## 2014-01-19 DIAGNOSIS — I749 Embolism and thrombosis of unspecified artery: Secondary | ICD-10-CM | POA: Diagnosis not present

## 2014-01-26 DIAGNOSIS — I749 Embolism and thrombosis of unspecified artery: Secondary | ICD-10-CM | POA: Diagnosis not present

## 2014-02-02 DIAGNOSIS — I749 Embolism and thrombosis of unspecified artery: Secondary | ICD-10-CM | POA: Diagnosis not present

## 2014-02-02 DIAGNOSIS — E1129 Type 2 diabetes mellitus with other diabetic kidney complication: Secondary | ICD-10-CM | POA: Diagnosis not present

## 2014-02-08 DIAGNOSIS — E119 Type 2 diabetes mellitus without complications: Secondary | ICD-10-CM | POA: Diagnosis not present

## 2014-02-08 DIAGNOSIS — Z01810 Encounter for preprocedural cardiovascular examination: Secondary | ICD-10-CM | POA: Diagnosis not present

## 2014-02-08 DIAGNOSIS — N186 End stage renal disease: Secondary | ICD-10-CM | POA: Diagnosis not present

## 2014-02-08 DIAGNOSIS — T82898A Other specified complication of vascular prosthetic devices, implants and grafts, initial encounter: Secondary | ICD-10-CM | POA: Diagnosis not present

## 2014-02-09 ENCOUNTER — Encounter: Payer: Self-pay | Admitting: Vascular Surgery

## 2014-02-09 DIAGNOSIS — I749 Embolism and thrombosis of unspecified artery: Secondary | ICD-10-CM | POA: Diagnosis not present

## 2014-02-10 ENCOUNTER — Ambulatory Visit: Payer: Self-pay | Admitting: Vascular Surgery

## 2014-02-10 ENCOUNTER — Encounter: Payer: Medicare Other | Admitting: Vascular Surgery

## 2014-02-10 DIAGNOSIS — I120 Hypertensive chronic kidney disease with stage 5 chronic kidney disease or end stage renal disease: Secondary | ICD-10-CM | POA: Diagnosis not present

## 2014-02-10 DIAGNOSIS — Z01812 Encounter for preprocedural laboratory examination: Secondary | ICD-10-CM | POA: Diagnosis not present

## 2014-02-10 DIAGNOSIS — Z79899 Other long term (current) drug therapy: Secondary | ICD-10-CM | POA: Diagnosis not present

## 2014-02-10 DIAGNOSIS — N186 End stage renal disease: Secondary | ICD-10-CM | POA: Diagnosis not present

## 2014-02-10 LAB — CBC WITH DIFFERENTIAL/PLATELET
Basophil #: 0.1 10*3/uL (ref 0.0–0.1)
Basophil %: 0.9 %
EOS ABS: 0.6 10*3/uL (ref 0.0–0.7)
EOS PCT: 7.2 %
HCT: 36.3 % — AB (ref 40.0–52.0)
HGB: 11.4 g/dL — ABNORMAL LOW (ref 13.0–18.0)
LYMPHS ABS: 1.8 10*3/uL (ref 1.0–3.6)
Lymphocyte %: 22.2 %
MCH: 29.4 pg (ref 26.0–34.0)
MCHC: 31.3 g/dL — ABNORMAL LOW (ref 32.0–36.0)
MCV: 94 fL (ref 80–100)
Monocyte #: 0.8 x10 3/mm (ref 0.2–1.0)
Monocyte %: 10.2 %
Neutrophil #: 4.8 10*3/uL (ref 1.4–6.5)
Neutrophil %: 59.5 %
PLATELETS: 140 10*3/uL — AB (ref 150–440)
RBC: 3.86 10*6/uL — ABNORMAL LOW (ref 4.40–5.90)
RDW: 16.7 % — ABNORMAL HIGH (ref 11.5–14.5)
WBC: 8.1 10*3/uL (ref 3.8–10.6)

## 2014-02-10 LAB — BASIC METABOLIC PANEL
Anion Gap: 11 (ref 7–16)
BUN: 40 mg/dL — ABNORMAL HIGH (ref 7–18)
CREATININE: 11.58 mg/dL — AB (ref 0.60–1.30)
Calcium, Total: 8.4 mg/dL — ABNORMAL LOW (ref 8.5–10.1)
Chloride: 99 mmol/L (ref 98–107)
Co2: 25 mmol/L (ref 21–32)
EGFR (African American): 5 — ABNORMAL LOW
GFR CALC NON AF AMER: 5 — AB
Glucose: 205 mg/dL — ABNORMAL HIGH (ref 65–99)
Osmolality: 286 (ref 275–301)
POTASSIUM: 4.6 mmol/L (ref 3.5–5.1)
Sodium: 135 mmol/L — ABNORMAL LOW (ref 136–145)

## 2014-02-11 DIAGNOSIS — N186 End stage renal disease: Secondary | ICD-10-CM | POA: Diagnosis not present

## 2014-02-14 DIAGNOSIS — N186 End stage renal disease: Secondary | ICD-10-CM | POA: Diagnosis not present

## 2014-02-14 DIAGNOSIS — N2581 Secondary hyperparathyroidism of renal origin: Secondary | ICD-10-CM | POA: Diagnosis not present

## 2014-02-14 DIAGNOSIS — N039 Chronic nephritic syndrome with unspecified morphologic changes: Secondary | ICD-10-CM | POA: Diagnosis not present

## 2014-02-14 DIAGNOSIS — D631 Anemia in chronic kidney disease: Secondary | ICD-10-CM | POA: Diagnosis not present

## 2014-02-14 DIAGNOSIS — T82898A Other specified complication of vascular prosthetic devices, implants and grafts, initial encounter: Secondary | ICD-10-CM | POA: Diagnosis not present

## 2014-02-14 DIAGNOSIS — D509 Iron deficiency anemia, unspecified: Secondary | ICD-10-CM | POA: Diagnosis not present

## 2014-02-16 DIAGNOSIS — D631 Anemia in chronic kidney disease: Secondary | ICD-10-CM | POA: Diagnosis not present

## 2014-02-16 DIAGNOSIS — N2581 Secondary hyperparathyroidism of renal origin: Secondary | ICD-10-CM | POA: Diagnosis not present

## 2014-02-16 DIAGNOSIS — D509 Iron deficiency anemia, unspecified: Secondary | ICD-10-CM | POA: Diagnosis not present

## 2014-02-16 DIAGNOSIS — N186 End stage renal disease: Secondary | ICD-10-CM | POA: Diagnosis not present

## 2014-02-16 DIAGNOSIS — I749 Embolism and thrombosis of unspecified artery: Secondary | ICD-10-CM | POA: Diagnosis not present

## 2014-02-16 DIAGNOSIS — T82898A Other specified complication of vascular prosthetic devices, implants and grafts, initial encounter: Secondary | ICD-10-CM | POA: Diagnosis not present

## 2014-02-17 ENCOUNTER — Ambulatory Visit: Payer: Self-pay | Admitting: Vascular Surgery

## 2014-02-17 DIAGNOSIS — Z992 Dependence on renal dialysis: Secondary | ICD-10-CM | POA: Diagnosis not present

## 2014-02-17 DIAGNOSIS — N186 End stage renal disease: Secondary | ICD-10-CM | POA: Diagnosis not present

## 2014-02-17 DIAGNOSIS — Z86718 Personal history of other venous thrombosis and embolism: Secondary | ICD-10-CM | POA: Diagnosis not present

## 2014-02-17 DIAGNOSIS — Z79899 Other long term (current) drug therapy: Secondary | ICD-10-CM | POA: Diagnosis not present

## 2014-02-17 DIAGNOSIS — F17200 Nicotine dependence, unspecified, uncomplicated: Secondary | ICD-10-CM | POA: Diagnosis not present

## 2014-02-17 DIAGNOSIS — Z8489 Family history of other specified conditions: Secondary | ICD-10-CM | POA: Diagnosis not present

## 2014-02-17 DIAGNOSIS — Z7901 Long term (current) use of anticoagulants: Secondary | ICD-10-CM | POA: Diagnosis not present

## 2014-02-17 DIAGNOSIS — Z794 Long term (current) use of insulin: Secondary | ICD-10-CM | POA: Diagnosis not present

## 2014-02-17 DIAGNOSIS — T82598A Other mechanical complication of other cardiac and vascular devices and implants, initial encounter: Secondary | ICD-10-CM | POA: Diagnosis not present

## 2014-02-17 DIAGNOSIS — E109 Type 1 diabetes mellitus without complications: Secondary | ICD-10-CM | POA: Diagnosis not present

## 2014-02-17 DIAGNOSIS — I120 Hypertensive chronic kidney disease with stage 5 chronic kidney disease or end stage renal disease: Secondary | ICD-10-CM | POA: Diagnosis not present

## 2014-02-17 LAB — PROTIME-INR
INR: 1.2
Prothrombin Time: 14.7 secs (ref 11.5–14.7)

## 2014-02-17 LAB — APTT: ACTIVATED PTT: 36 s — AB (ref 23.6–35.9)

## 2014-02-17 LAB — POTASSIUM: Potassium: 4.6 mmol/L (ref 3.5–5.1)

## 2014-02-18 DIAGNOSIS — D509 Iron deficiency anemia, unspecified: Secondary | ICD-10-CM | POA: Diagnosis not present

## 2014-02-18 DIAGNOSIS — T82898A Other specified complication of vascular prosthetic devices, implants and grafts, initial encounter: Secondary | ICD-10-CM | POA: Diagnosis not present

## 2014-02-18 DIAGNOSIS — N2581 Secondary hyperparathyroidism of renal origin: Secondary | ICD-10-CM | POA: Diagnosis not present

## 2014-02-18 DIAGNOSIS — N186 End stage renal disease: Secondary | ICD-10-CM | POA: Diagnosis not present

## 2014-02-18 DIAGNOSIS — D631 Anemia in chronic kidney disease: Secondary | ICD-10-CM | POA: Diagnosis not present

## 2014-02-21 DIAGNOSIS — D509 Iron deficiency anemia, unspecified: Secondary | ICD-10-CM | POA: Diagnosis not present

## 2014-02-21 DIAGNOSIS — N2581 Secondary hyperparathyroidism of renal origin: Secondary | ICD-10-CM | POA: Diagnosis not present

## 2014-02-21 DIAGNOSIS — D631 Anemia in chronic kidney disease: Secondary | ICD-10-CM | POA: Diagnosis not present

## 2014-02-21 DIAGNOSIS — N186 End stage renal disease: Secondary | ICD-10-CM | POA: Diagnosis not present

## 2014-02-21 DIAGNOSIS — T82898A Other specified complication of vascular prosthetic devices, implants and grafts, initial encounter: Secondary | ICD-10-CM | POA: Diagnosis not present

## 2014-02-23 DIAGNOSIS — I749 Embolism and thrombosis of unspecified artery: Secondary | ICD-10-CM | POA: Diagnosis not present

## 2014-02-23 DIAGNOSIS — D631 Anemia in chronic kidney disease: Secondary | ICD-10-CM | POA: Diagnosis not present

## 2014-02-23 DIAGNOSIS — D509 Iron deficiency anemia, unspecified: Secondary | ICD-10-CM | POA: Diagnosis not present

## 2014-02-23 DIAGNOSIS — N186 End stage renal disease: Secondary | ICD-10-CM | POA: Diagnosis not present

## 2014-02-23 DIAGNOSIS — N2581 Secondary hyperparathyroidism of renal origin: Secondary | ICD-10-CM | POA: Diagnosis not present

## 2014-02-23 DIAGNOSIS — T82898A Other specified complication of vascular prosthetic devices, implants and grafts, initial encounter: Secondary | ICD-10-CM | POA: Diagnosis not present

## 2014-02-25 DIAGNOSIS — T82898A Other specified complication of vascular prosthetic devices, implants and grafts, initial encounter: Secondary | ICD-10-CM | POA: Diagnosis not present

## 2014-02-25 DIAGNOSIS — N186 End stage renal disease: Secondary | ICD-10-CM | POA: Diagnosis not present

## 2014-02-25 DIAGNOSIS — N2581 Secondary hyperparathyroidism of renal origin: Secondary | ICD-10-CM | POA: Diagnosis not present

## 2014-02-25 DIAGNOSIS — D631 Anemia in chronic kidney disease: Secondary | ICD-10-CM | POA: Diagnosis not present

## 2014-02-25 DIAGNOSIS — D509 Iron deficiency anemia, unspecified: Secondary | ICD-10-CM | POA: Diagnosis not present

## 2014-03-01 DIAGNOSIS — D509 Iron deficiency anemia, unspecified: Secondary | ICD-10-CM | POA: Diagnosis not present

## 2014-03-01 DIAGNOSIS — N2581 Secondary hyperparathyroidism of renal origin: Secondary | ICD-10-CM | POA: Diagnosis not present

## 2014-03-01 DIAGNOSIS — D631 Anemia in chronic kidney disease: Secondary | ICD-10-CM | POA: Diagnosis not present

## 2014-03-01 DIAGNOSIS — I871 Compression of vein: Secondary | ICD-10-CM | POA: Diagnosis not present

## 2014-03-01 DIAGNOSIS — N186 End stage renal disease: Secondary | ICD-10-CM | POA: Diagnosis not present

## 2014-03-01 DIAGNOSIS — T82898A Other specified complication of vascular prosthetic devices, implants and grafts, initial encounter: Secondary | ICD-10-CM | POA: Diagnosis not present

## 2014-03-02 DIAGNOSIS — I749 Embolism and thrombosis of unspecified artery: Secondary | ICD-10-CM | POA: Diagnosis not present

## 2014-03-02 DIAGNOSIS — N186 End stage renal disease: Secondary | ICD-10-CM | POA: Diagnosis not present

## 2014-03-02 DIAGNOSIS — T82898A Other specified complication of vascular prosthetic devices, implants and grafts, initial encounter: Secondary | ICD-10-CM | POA: Diagnosis not present

## 2014-03-02 DIAGNOSIS — D631 Anemia in chronic kidney disease: Secondary | ICD-10-CM | POA: Diagnosis not present

## 2014-03-02 DIAGNOSIS — D509 Iron deficiency anemia, unspecified: Secondary | ICD-10-CM | POA: Diagnosis not present

## 2014-03-02 DIAGNOSIS — N2581 Secondary hyperparathyroidism of renal origin: Secondary | ICD-10-CM | POA: Diagnosis not present

## 2014-03-04 DIAGNOSIS — N186 End stage renal disease: Secondary | ICD-10-CM | POA: Diagnosis not present

## 2014-03-04 DIAGNOSIS — T82898A Other specified complication of vascular prosthetic devices, implants and grafts, initial encounter: Secondary | ICD-10-CM | POA: Diagnosis not present

## 2014-03-04 DIAGNOSIS — D631 Anemia in chronic kidney disease: Secondary | ICD-10-CM | POA: Diagnosis not present

## 2014-03-04 DIAGNOSIS — D509 Iron deficiency anemia, unspecified: Secondary | ICD-10-CM | POA: Diagnosis not present

## 2014-03-04 DIAGNOSIS — N2581 Secondary hyperparathyroidism of renal origin: Secondary | ICD-10-CM | POA: Diagnosis not present

## 2014-03-07 DIAGNOSIS — N2581 Secondary hyperparathyroidism of renal origin: Secondary | ICD-10-CM | POA: Diagnosis not present

## 2014-03-07 DIAGNOSIS — N186 End stage renal disease: Secondary | ICD-10-CM | POA: Diagnosis not present

## 2014-03-07 DIAGNOSIS — N039 Chronic nephritic syndrome with unspecified morphologic changes: Secondary | ICD-10-CM | POA: Diagnosis not present

## 2014-03-07 DIAGNOSIS — D509 Iron deficiency anemia, unspecified: Secondary | ICD-10-CM | POA: Diagnosis not present

## 2014-03-07 DIAGNOSIS — T82898A Other specified complication of vascular prosthetic devices, implants and grafts, initial encounter: Secondary | ICD-10-CM | POA: Diagnosis not present

## 2014-03-07 DIAGNOSIS — D631 Anemia in chronic kidney disease: Secondary | ICD-10-CM | POA: Diagnosis not present

## 2014-03-09 DIAGNOSIS — D509 Iron deficiency anemia, unspecified: Secondary | ICD-10-CM | POA: Diagnosis not present

## 2014-03-09 DIAGNOSIS — T82898A Other specified complication of vascular prosthetic devices, implants and grafts, initial encounter: Secondary | ICD-10-CM | POA: Diagnosis not present

## 2014-03-09 DIAGNOSIS — D631 Anemia in chronic kidney disease: Secondary | ICD-10-CM | POA: Diagnosis not present

## 2014-03-09 DIAGNOSIS — N186 End stage renal disease: Secondary | ICD-10-CM | POA: Diagnosis not present

## 2014-03-09 DIAGNOSIS — I749 Embolism and thrombosis of unspecified artery: Secondary | ICD-10-CM | POA: Diagnosis not present

## 2014-03-09 DIAGNOSIS — N2581 Secondary hyperparathyroidism of renal origin: Secondary | ICD-10-CM | POA: Diagnosis not present

## 2014-03-11 DIAGNOSIS — T82898A Other specified complication of vascular prosthetic devices, implants and grafts, initial encounter: Secondary | ICD-10-CM | POA: Diagnosis not present

## 2014-03-11 DIAGNOSIS — D631 Anemia in chronic kidney disease: Secondary | ICD-10-CM | POA: Diagnosis not present

## 2014-03-11 DIAGNOSIS — N039 Chronic nephritic syndrome with unspecified morphologic changes: Secondary | ICD-10-CM | POA: Diagnosis not present

## 2014-03-11 DIAGNOSIS — N2581 Secondary hyperparathyroidism of renal origin: Secondary | ICD-10-CM | POA: Diagnosis not present

## 2014-03-11 DIAGNOSIS — N186 End stage renal disease: Secondary | ICD-10-CM | POA: Diagnosis not present

## 2014-03-11 DIAGNOSIS — D509 Iron deficiency anemia, unspecified: Secondary | ICD-10-CM | POA: Diagnosis not present

## 2014-03-14 DIAGNOSIS — T82898A Other specified complication of vascular prosthetic devices, implants and grafts, initial encounter: Secondary | ICD-10-CM | POA: Diagnosis not present

## 2014-03-14 DIAGNOSIS — N186 End stage renal disease: Secondary | ICD-10-CM | POA: Diagnosis not present

## 2014-03-14 DIAGNOSIS — D631 Anemia in chronic kidney disease: Secondary | ICD-10-CM | POA: Diagnosis not present

## 2014-03-14 DIAGNOSIS — N2581 Secondary hyperparathyroidism of renal origin: Secondary | ICD-10-CM | POA: Diagnosis not present

## 2014-03-14 DIAGNOSIS — D509 Iron deficiency anemia, unspecified: Secondary | ICD-10-CM | POA: Diagnosis not present

## 2014-03-16 DIAGNOSIS — E119 Type 2 diabetes mellitus without complications: Secondary | ICD-10-CM | POA: Diagnosis not present

## 2014-03-16 DIAGNOSIS — N186 End stage renal disease: Secondary | ICD-10-CM | POA: Diagnosis not present

## 2014-03-16 DIAGNOSIS — D509 Iron deficiency anemia, unspecified: Secondary | ICD-10-CM | POA: Diagnosis not present

## 2014-03-16 DIAGNOSIS — D631 Anemia in chronic kidney disease: Secondary | ICD-10-CM | POA: Diagnosis not present

## 2014-03-16 DIAGNOSIS — I749 Embolism and thrombosis of unspecified artery: Secondary | ICD-10-CM | POA: Diagnosis not present

## 2014-03-16 DIAGNOSIS — N2581 Secondary hyperparathyroidism of renal origin: Secondary | ICD-10-CM | POA: Diagnosis not present

## 2014-03-17 DIAGNOSIS — N186 End stage renal disease: Secondary | ICD-10-CM | POA: Diagnosis not present

## 2014-03-17 DIAGNOSIS — Z452 Encounter for adjustment and management of vascular access device: Secondary | ICD-10-CM | POA: Diagnosis not present

## 2014-03-22 DIAGNOSIS — T82898A Other specified complication of vascular prosthetic devices, implants and grafts, initial encounter: Secondary | ICD-10-CM | POA: Diagnosis not present

## 2014-03-22 DIAGNOSIS — I10 Essential (primary) hypertension: Secondary | ICD-10-CM | POA: Diagnosis not present

## 2014-03-22 DIAGNOSIS — N186 End stage renal disease: Secondary | ICD-10-CM | POA: Diagnosis not present

## 2014-03-23 DIAGNOSIS — D689 Coagulation defect, unspecified: Secondary | ICD-10-CM | POA: Diagnosis not present

## 2014-04-06 DIAGNOSIS — D689 Coagulation defect, unspecified: Secondary | ICD-10-CM | POA: Diagnosis not present

## 2014-04-13 DIAGNOSIS — N186 End stage renal disease: Secondary | ICD-10-CM | POA: Diagnosis not present

## 2014-04-15 DIAGNOSIS — Z23 Encounter for immunization: Secondary | ICD-10-CM | POA: Diagnosis not present

## 2014-04-15 DIAGNOSIS — E0840 Diabetes mellitus due to underlying condition with diabetic neuropathy, unspecified: Secondary | ICD-10-CM | POA: Diagnosis not present

## 2014-04-15 DIAGNOSIS — D631 Anemia in chronic kidney disease: Secondary | ICD-10-CM | POA: Diagnosis not present

## 2014-04-15 DIAGNOSIS — N2581 Secondary hyperparathyroidism of renal origin: Secondary | ICD-10-CM | POA: Diagnosis not present

## 2014-04-15 DIAGNOSIS — D509 Iron deficiency anemia, unspecified: Secondary | ICD-10-CM | POA: Diagnosis not present

## 2014-04-15 DIAGNOSIS — E119 Type 2 diabetes mellitus without complications: Secondary | ICD-10-CM | POA: Diagnosis not present

## 2014-04-15 DIAGNOSIS — N186 End stage renal disease: Secondary | ICD-10-CM | POA: Diagnosis not present

## 2014-04-20 DIAGNOSIS — R791 Abnormal coagulation profile: Secondary | ICD-10-CM | POA: Diagnosis not present

## 2014-05-04 DIAGNOSIS — R791 Abnormal coagulation profile: Secondary | ICD-10-CM | POA: Diagnosis not present

## 2014-05-11 DIAGNOSIS — E1129 Type 2 diabetes mellitus with other diabetic kidney complication: Secondary | ICD-10-CM | POA: Diagnosis not present

## 2014-05-14 DIAGNOSIS — N186 End stage renal disease: Secondary | ICD-10-CM | POA: Diagnosis not present

## 2014-05-14 DIAGNOSIS — Z992 Dependence on renal dialysis: Secondary | ICD-10-CM | POA: Diagnosis not present

## 2014-05-16 DIAGNOSIS — E0840 Diabetes mellitus due to underlying condition with diabetic neuropathy, unspecified: Secondary | ICD-10-CM | POA: Diagnosis not present

## 2014-05-16 DIAGNOSIS — D509 Iron deficiency anemia, unspecified: Secondary | ICD-10-CM | POA: Diagnosis not present

## 2014-05-16 DIAGNOSIS — E119 Type 2 diabetes mellitus without complications: Secondary | ICD-10-CM | POA: Diagnosis not present

## 2014-05-16 DIAGNOSIS — N2581 Secondary hyperparathyroidism of renal origin: Secondary | ICD-10-CM | POA: Diagnosis not present

## 2014-05-16 DIAGNOSIS — N186 End stage renal disease: Secondary | ICD-10-CM | POA: Diagnosis not present

## 2014-05-16 DIAGNOSIS — D631 Anemia in chronic kidney disease: Secondary | ICD-10-CM | POA: Diagnosis not present

## 2014-05-18 DIAGNOSIS — R791 Abnormal coagulation profile: Secondary | ICD-10-CM | POA: Diagnosis not present

## 2014-05-18 DIAGNOSIS — D509 Iron deficiency anemia, unspecified: Secondary | ICD-10-CM | POA: Diagnosis not present

## 2014-05-18 DIAGNOSIS — N186 End stage renal disease: Secondary | ICD-10-CM | POA: Diagnosis not present

## 2014-05-18 DIAGNOSIS — D631 Anemia in chronic kidney disease: Secondary | ICD-10-CM | POA: Diagnosis not present

## 2014-05-18 DIAGNOSIS — E0840 Diabetes mellitus due to underlying condition with diabetic neuropathy, unspecified: Secondary | ICD-10-CM | POA: Diagnosis not present

## 2014-05-18 DIAGNOSIS — E119 Type 2 diabetes mellitus without complications: Secondary | ICD-10-CM | POA: Diagnosis not present

## 2014-05-18 DIAGNOSIS — N2581 Secondary hyperparathyroidism of renal origin: Secondary | ICD-10-CM | POA: Diagnosis not present

## 2014-05-20 DIAGNOSIS — E0840 Diabetes mellitus due to underlying condition with diabetic neuropathy, unspecified: Secondary | ICD-10-CM | POA: Diagnosis not present

## 2014-05-20 DIAGNOSIS — N2581 Secondary hyperparathyroidism of renal origin: Secondary | ICD-10-CM | POA: Diagnosis not present

## 2014-05-20 DIAGNOSIS — E119 Type 2 diabetes mellitus without complications: Secondary | ICD-10-CM | POA: Diagnosis not present

## 2014-05-20 DIAGNOSIS — D509 Iron deficiency anemia, unspecified: Secondary | ICD-10-CM | POA: Diagnosis not present

## 2014-05-20 DIAGNOSIS — N186 End stage renal disease: Secondary | ICD-10-CM | POA: Diagnosis not present

## 2014-05-20 DIAGNOSIS — D631 Anemia in chronic kidney disease: Secondary | ICD-10-CM | POA: Diagnosis not present

## 2014-05-23 DIAGNOSIS — D631 Anemia in chronic kidney disease: Secondary | ICD-10-CM | POA: Diagnosis not present

## 2014-05-23 DIAGNOSIS — N2581 Secondary hyperparathyroidism of renal origin: Secondary | ICD-10-CM | POA: Diagnosis not present

## 2014-05-23 DIAGNOSIS — N186 End stage renal disease: Secondary | ICD-10-CM | POA: Diagnosis not present

## 2014-05-23 DIAGNOSIS — D509 Iron deficiency anemia, unspecified: Secondary | ICD-10-CM | POA: Diagnosis not present

## 2014-05-23 DIAGNOSIS — E119 Type 2 diabetes mellitus without complications: Secondary | ICD-10-CM | POA: Diagnosis not present

## 2014-05-23 DIAGNOSIS — E0840 Diabetes mellitus due to underlying condition with diabetic neuropathy, unspecified: Secondary | ICD-10-CM | POA: Diagnosis not present

## 2014-05-25 DIAGNOSIS — N2581 Secondary hyperparathyroidism of renal origin: Secondary | ICD-10-CM | POA: Diagnosis not present

## 2014-05-25 DIAGNOSIS — E0840 Diabetes mellitus due to underlying condition with diabetic neuropathy, unspecified: Secondary | ICD-10-CM | POA: Diagnosis not present

## 2014-05-25 DIAGNOSIS — N186 End stage renal disease: Secondary | ICD-10-CM | POA: Diagnosis not present

## 2014-05-25 DIAGNOSIS — D631 Anemia in chronic kidney disease: Secondary | ICD-10-CM | POA: Diagnosis not present

## 2014-05-25 DIAGNOSIS — D509 Iron deficiency anemia, unspecified: Secondary | ICD-10-CM | POA: Diagnosis not present

## 2014-05-25 DIAGNOSIS — E119 Type 2 diabetes mellitus without complications: Secondary | ICD-10-CM | POA: Diagnosis not present

## 2014-05-27 DIAGNOSIS — E119 Type 2 diabetes mellitus without complications: Secondary | ICD-10-CM | POA: Diagnosis not present

## 2014-05-27 DIAGNOSIS — D509 Iron deficiency anemia, unspecified: Secondary | ICD-10-CM | POA: Diagnosis not present

## 2014-05-27 DIAGNOSIS — D631 Anemia in chronic kidney disease: Secondary | ICD-10-CM | POA: Diagnosis not present

## 2014-05-27 DIAGNOSIS — E0840 Diabetes mellitus due to underlying condition with diabetic neuropathy, unspecified: Secondary | ICD-10-CM | POA: Diagnosis not present

## 2014-05-27 DIAGNOSIS — N2581 Secondary hyperparathyroidism of renal origin: Secondary | ICD-10-CM | POA: Diagnosis not present

## 2014-05-27 DIAGNOSIS — N186 End stage renal disease: Secondary | ICD-10-CM | POA: Diagnosis not present

## 2014-05-30 DIAGNOSIS — D509 Iron deficiency anemia, unspecified: Secondary | ICD-10-CM | POA: Diagnosis not present

## 2014-05-30 DIAGNOSIS — N2581 Secondary hyperparathyroidism of renal origin: Secondary | ICD-10-CM | POA: Diagnosis not present

## 2014-05-30 DIAGNOSIS — E119 Type 2 diabetes mellitus without complications: Secondary | ICD-10-CM | POA: Diagnosis not present

## 2014-05-30 DIAGNOSIS — D631 Anemia in chronic kidney disease: Secondary | ICD-10-CM | POA: Diagnosis not present

## 2014-05-30 DIAGNOSIS — N186 End stage renal disease: Secondary | ICD-10-CM | POA: Diagnosis not present

## 2014-05-30 DIAGNOSIS — E0840 Diabetes mellitus due to underlying condition with diabetic neuropathy, unspecified: Secondary | ICD-10-CM | POA: Diagnosis not present

## 2014-06-01 DIAGNOSIS — D631 Anemia in chronic kidney disease: Secondary | ICD-10-CM | POA: Diagnosis not present

## 2014-06-01 DIAGNOSIS — N2581 Secondary hyperparathyroidism of renal origin: Secondary | ICD-10-CM | POA: Diagnosis not present

## 2014-06-01 DIAGNOSIS — E119 Type 2 diabetes mellitus without complications: Secondary | ICD-10-CM | POA: Diagnosis not present

## 2014-06-01 DIAGNOSIS — N186 End stage renal disease: Secondary | ICD-10-CM | POA: Diagnosis not present

## 2014-06-01 DIAGNOSIS — D509 Iron deficiency anemia, unspecified: Secondary | ICD-10-CM | POA: Diagnosis not present

## 2014-06-01 DIAGNOSIS — I5031 Acute diastolic (congestive) heart failure: Secondary | ICD-10-CM | POA: Diagnosis not present

## 2014-06-01 DIAGNOSIS — E0840 Diabetes mellitus due to underlying condition with diabetic neuropathy, unspecified: Secondary | ICD-10-CM | POA: Diagnosis not present

## 2014-06-03 DIAGNOSIS — N186 End stage renal disease: Secondary | ICD-10-CM | POA: Diagnosis not present

## 2014-06-03 DIAGNOSIS — E0840 Diabetes mellitus due to underlying condition with diabetic neuropathy, unspecified: Secondary | ICD-10-CM | POA: Diagnosis not present

## 2014-06-03 DIAGNOSIS — N2581 Secondary hyperparathyroidism of renal origin: Secondary | ICD-10-CM | POA: Diagnosis not present

## 2014-06-03 DIAGNOSIS — D631 Anemia in chronic kidney disease: Secondary | ICD-10-CM | POA: Diagnosis not present

## 2014-06-03 DIAGNOSIS — E119 Type 2 diabetes mellitus without complications: Secondary | ICD-10-CM | POA: Diagnosis not present

## 2014-06-03 DIAGNOSIS — D509 Iron deficiency anemia, unspecified: Secondary | ICD-10-CM | POA: Diagnosis not present

## 2014-06-05 DIAGNOSIS — D631 Anemia in chronic kidney disease: Secondary | ICD-10-CM | POA: Diagnosis not present

## 2014-06-05 DIAGNOSIS — E119 Type 2 diabetes mellitus without complications: Secondary | ICD-10-CM | POA: Diagnosis not present

## 2014-06-05 DIAGNOSIS — N186 End stage renal disease: Secondary | ICD-10-CM | POA: Diagnosis not present

## 2014-06-05 DIAGNOSIS — N2581 Secondary hyperparathyroidism of renal origin: Secondary | ICD-10-CM | POA: Diagnosis not present

## 2014-06-05 DIAGNOSIS — E0840 Diabetes mellitus due to underlying condition with diabetic neuropathy, unspecified: Secondary | ICD-10-CM | POA: Diagnosis not present

## 2014-06-05 DIAGNOSIS — D509 Iron deficiency anemia, unspecified: Secondary | ICD-10-CM | POA: Diagnosis not present

## 2014-06-07 DIAGNOSIS — N2581 Secondary hyperparathyroidism of renal origin: Secondary | ICD-10-CM | POA: Diagnosis not present

## 2014-06-07 DIAGNOSIS — E119 Type 2 diabetes mellitus without complications: Secondary | ICD-10-CM | POA: Diagnosis not present

## 2014-06-07 DIAGNOSIS — E0840 Diabetes mellitus due to underlying condition with diabetic neuropathy, unspecified: Secondary | ICD-10-CM | POA: Diagnosis not present

## 2014-06-07 DIAGNOSIS — D631 Anemia in chronic kidney disease: Secondary | ICD-10-CM | POA: Diagnosis not present

## 2014-06-07 DIAGNOSIS — D509 Iron deficiency anemia, unspecified: Secondary | ICD-10-CM | POA: Diagnosis not present

## 2014-06-07 DIAGNOSIS — N186 End stage renal disease: Secondary | ICD-10-CM | POA: Diagnosis not present

## 2014-06-10 DIAGNOSIS — E0840 Diabetes mellitus due to underlying condition with diabetic neuropathy, unspecified: Secondary | ICD-10-CM | POA: Diagnosis not present

## 2014-06-10 DIAGNOSIS — D509 Iron deficiency anemia, unspecified: Secondary | ICD-10-CM | POA: Diagnosis not present

## 2014-06-10 DIAGNOSIS — N2581 Secondary hyperparathyroidism of renal origin: Secondary | ICD-10-CM | POA: Diagnosis not present

## 2014-06-10 DIAGNOSIS — N186 End stage renal disease: Secondary | ICD-10-CM | POA: Diagnosis not present

## 2014-06-10 DIAGNOSIS — D631 Anemia in chronic kidney disease: Secondary | ICD-10-CM | POA: Diagnosis not present

## 2014-06-10 DIAGNOSIS — E119 Type 2 diabetes mellitus without complications: Secondary | ICD-10-CM | POA: Diagnosis not present

## 2014-06-13 DIAGNOSIS — N2581 Secondary hyperparathyroidism of renal origin: Secondary | ICD-10-CM | POA: Diagnosis not present

## 2014-06-13 DIAGNOSIS — D631 Anemia in chronic kidney disease: Secondary | ICD-10-CM | POA: Diagnosis not present

## 2014-06-13 DIAGNOSIS — E0840 Diabetes mellitus due to underlying condition with diabetic neuropathy, unspecified: Secondary | ICD-10-CM | POA: Diagnosis not present

## 2014-06-13 DIAGNOSIS — N182 Chronic kidney disease, stage 2 (mild): Secondary | ICD-10-CM | POA: Diagnosis not present

## 2014-06-13 DIAGNOSIS — E119 Type 2 diabetes mellitus without complications: Secondary | ICD-10-CM | POA: Diagnosis not present

## 2014-06-13 DIAGNOSIS — Z992 Dependence on renal dialysis: Secondary | ICD-10-CM | POA: Diagnosis not present

## 2014-06-13 DIAGNOSIS — N186 End stage renal disease: Secondary | ICD-10-CM | POA: Diagnosis not present

## 2014-06-13 DIAGNOSIS — D509 Iron deficiency anemia, unspecified: Secondary | ICD-10-CM | POA: Diagnosis not present

## 2014-06-15 DIAGNOSIS — N2581 Secondary hyperparathyroidism of renal origin: Secondary | ICD-10-CM | POA: Diagnosis not present

## 2014-06-15 DIAGNOSIS — D509 Iron deficiency anemia, unspecified: Secondary | ICD-10-CM | POA: Diagnosis not present

## 2014-06-15 DIAGNOSIS — D631 Anemia in chronic kidney disease: Secondary | ICD-10-CM | POA: Diagnosis not present

## 2014-06-15 DIAGNOSIS — E119 Type 2 diabetes mellitus without complications: Secondary | ICD-10-CM | POA: Diagnosis not present

## 2014-06-15 DIAGNOSIS — N186 End stage renal disease: Secondary | ICD-10-CM | POA: Diagnosis not present

## 2014-06-15 DIAGNOSIS — E0840 Diabetes mellitus due to underlying condition with diabetic neuropathy, unspecified: Secondary | ICD-10-CM | POA: Diagnosis not present

## 2014-06-16 DIAGNOSIS — Z4931 Encounter for adequacy testing for hemodialysis: Secondary | ICD-10-CM | POA: Insufficient documentation

## 2014-06-23 ENCOUNTER — Encounter (HOSPITAL_COMMUNITY): Payer: Self-pay | Admitting: Vascular Surgery

## 2014-06-29 DIAGNOSIS — I871 Compression of vein: Secondary | ICD-10-CM | POA: Diagnosis not present

## 2014-06-29 DIAGNOSIS — T82858D Stenosis of vascular prosthetic devices, implants and grafts, subsequent encounter: Secondary | ICD-10-CM | POA: Diagnosis not present

## 2014-06-29 DIAGNOSIS — N186 End stage renal disease: Secondary | ICD-10-CM | POA: Diagnosis not present

## 2014-06-29 DIAGNOSIS — Z992 Dependence on renal dialysis: Secondary | ICD-10-CM | POA: Diagnosis not present

## 2014-07-13 DIAGNOSIS — E119 Type 2 diabetes mellitus without complications: Secondary | ICD-10-CM | POA: Insufficient documentation

## 2014-07-14 DIAGNOSIS — N186 End stage renal disease: Secondary | ICD-10-CM | POA: Diagnosis not present

## 2014-07-14 DIAGNOSIS — Z992 Dependence on renal dialysis: Secondary | ICD-10-CM | POA: Diagnosis not present

## 2014-07-16 DIAGNOSIS — D509 Iron deficiency anemia, unspecified: Secondary | ICD-10-CM | POA: Diagnosis not present

## 2014-07-16 DIAGNOSIS — N186 End stage renal disease: Secondary | ICD-10-CM | POA: Diagnosis not present

## 2014-07-16 DIAGNOSIS — N2581 Secondary hyperparathyroidism of renal origin: Secondary | ICD-10-CM | POA: Diagnosis not present

## 2014-07-16 DIAGNOSIS — E0840 Diabetes mellitus due to underlying condition with diabetic neuropathy, unspecified: Secondary | ICD-10-CM | POA: Diagnosis not present

## 2014-07-16 DIAGNOSIS — E119 Type 2 diabetes mellitus without complications: Secondary | ICD-10-CM | POA: Diagnosis not present

## 2014-07-16 DIAGNOSIS — D631 Anemia in chronic kidney disease: Secondary | ICD-10-CM | POA: Diagnosis not present

## 2014-07-18 DIAGNOSIS — D509 Iron deficiency anemia, unspecified: Secondary | ICD-10-CM | POA: Diagnosis not present

## 2014-07-18 DIAGNOSIS — D631 Anemia in chronic kidney disease: Secondary | ICD-10-CM | POA: Diagnosis not present

## 2014-07-18 DIAGNOSIS — N186 End stage renal disease: Secondary | ICD-10-CM | POA: Diagnosis not present

## 2014-07-18 DIAGNOSIS — N2581 Secondary hyperparathyroidism of renal origin: Secondary | ICD-10-CM | POA: Diagnosis not present

## 2014-07-18 DIAGNOSIS — E0840 Diabetes mellitus due to underlying condition with diabetic neuropathy, unspecified: Secondary | ICD-10-CM | POA: Diagnosis not present

## 2014-07-18 DIAGNOSIS — E119 Type 2 diabetes mellitus without complications: Secondary | ICD-10-CM | POA: Diagnosis not present

## 2014-07-20 DIAGNOSIS — D631 Anemia in chronic kidney disease: Secondary | ICD-10-CM | POA: Diagnosis not present

## 2014-07-20 DIAGNOSIS — N2581 Secondary hyperparathyroidism of renal origin: Secondary | ICD-10-CM | POA: Diagnosis not present

## 2014-07-20 DIAGNOSIS — D509 Iron deficiency anemia, unspecified: Secondary | ICD-10-CM | POA: Diagnosis not present

## 2014-07-20 DIAGNOSIS — E0840 Diabetes mellitus due to underlying condition with diabetic neuropathy, unspecified: Secondary | ICD-10-CM | POA: Diagnosis not present

## 2014-07-20 DIAGNOSIS — E119 Type 2 diabetes mellitus without complications: Secondary | ICD-10-CM | POA: Diagnosis not present

## 2014-07-20 DIAGNOSIS — N186 End stage renal disease: Secondary | ICD-10-CM | POA: Diagnosis not present

## 2014-07-22 DIAGNOSIS — D631 Anemia in chronic kidney disease: Secondary | ICD-10-CM | POA: Diagnosis not present

## 2014-07-22 DIAGNOSIS — D509 Iron deficiency anemia, unspecified: Secondary | ICD-10-CM | POA: Diagnosis not present

## 2014-07-22 DIAGNOSIS — E119 Type 2 diabetes mellitus without complications: Secondary | ICD-10-CM | POA: Diagnosis not present

## 2014-07-22 DIAGNOSIS — N186 End stage renal disease: Secondary | ICD-10-CM | POA: Diagnosis not present

## 2014-07-22 DIAGNOSIS — E0840 Diabetes mellitus due to underlying condition with diabetic neuropathy, unspecified: Secondary | ICD-10-CM | POA: Diagnosis not present

## 2014-07-22 DIAGNOSIS — N2581 Secondary hyperparathyroidism of renal origin: Secondary | ICD-10-CM | POA: Diagnosis not present

## 2014-07-25 DIAGNOSIS — N2581 Secondary hyperparathyroidism of renal origin: Secondary | ICD-10-CM | POA: Diagnosis not present

## 2014-07-25 DIAGNOSIS — D509 Iron deficiency anemia, unspecified: Secondary | ICD-10-CM | POA: Diagnosis not present

## 2014-07-25 DIAGNOSIS — N186 End stage renal disease: Secondary | ICD-10-CM | POA: Diagnosis not present

## 2014-07-25 DIAGNOSIS — D631 Anemia in chronic kidney disease: Secondary | ICD-10-CM | POA: Diagnosis not present

## 2014-07-25 DIAGNOSIS — E119 Type 2 diabetes mellitus without complications: Secondary | ICD-10-CM | POA: Diagnosis not present

## 2014-07-25 DIAGNOSIS — E0840 Diabetes mellitus due to underlying condition with diabetic neuropathy, unspecified: Secondary | ICD-10-CM | POA: Diagnosis not present

## 2014-07-27 DIAGNOSIS — E119 Type 2 diabetes mellitus without complications: Secondary | ICD-10-CM | POA: Diagnosis not present

## 2014-07-27 DIAGNOSIS — D631 Anemia in chronic kidney disease: Secondary | ICD-10-CM | POA: Diagnosis not present

## 2014-07-27 DIAGNOSIS — N2581 Secondary hyperparathyroidism of renal origin: Secondary | ICD-10-CM | POA: Diagnosis not present

## 2014-07-27 DIAGNOSIS — D509 Iron deficiency anemia, unspecified: Secondary | ICD-10-CM | POA: Diagnosis not present

## 2014-07-27 DIAGNOSIS — E0840 Diabetes mellitus due to underlying condition with diabetic neuropathy, unspecified: Secondary | ICD-10-CM | POA: Diagnosis not present

## 2014-07-27 DIAGNOSIS — N186 End stage renal disease: Secondary | ICD-10-CM | POA: Diagnosis not present

## 2014-07-28 ENCOUNTER — Encounter (HOSPITAL_COMMUNITY): Payer: Self-pay | Admitting: Surgery

## 2014-07-29 DIAGNOSIS — D509 Iron deficiency anemia, unspecified: Secondary | ICD-10-CM | POA: Diagnosis not present

## 2014-07-29 DIAGNOSIS — E0840 Diabetes mellitus due to underlying condition with diabetic neuropathy, unspecified: Secondary | ICD-10-CM | POA: Diagnosis not present

## 2014-07-29 DIAGNOSIS — N2581 Secondary hyperparathyroidism of renal origin: Secondary | ICD-10-CM | POA: Diagnosis not present

## 2014-07-29 DIAGNOSIS — N186 End stage renal disease: Secondary | ICD-10-CM | POA: Diagnosis not present

## 2014-07-29 DIAGNOSIS — E119 Type 2 diabetes mellitus without complications: Secondary | ICD-10-CM | POA: Diagnosis not present

## 2014-07-29 DIAGNOSIS — D631 Anemia in chronic kidney disease: Secondary | ICD-10-CM | POA: Diagnosis not present

## 2014-08-01 DIAGNOSIS — D509 Iron deficiency anemia, unspecified: Secondary | ICD-10-CM | POA: Diagnosis not present

## 2014-08-01 DIAGNOSIS — N2581 Secondary hyperparathyroidism of renal origin: Secondary | ICD-10-CM | POA: Diagnosis not present

## 2014-08-01 DIAGNOSIS — E0840 Diabetes mellitus due to underlying condition with diabetic neuropathy, unspecified: Secondary | ICD-10-CM | POA: Diagnosis not present

## 2014-08-01 DIAGNOSIS — D631 Anemia in chronic kidney disease: Secondary | ICD-10-CM | POA: Diagnosis not present

## 2014-08-01 DIAGNOSIS — N186 End stage renal disease: Secondary | ICD-10-CM | POA: Diagnosis not present

## 2014-08-01 DIAGNOSIS — E119 Type 2 diabetes mellitus without complications: Secondary | ICD-10-CM | POA: Diagnosis not present

## 2014-08-03 DIAGNOSIS — E0840 Diabetes mellitus due to underlying condition with diabetic neuropathy, unspecified: Secondary | ICD-10-CM | POA: Diagnosis not present

## 2014-08-03 DIAGNOSIS — N2581 Secondary hyperparathyroidism of renal origin: Secondary | ICD-10-CM | POA: Diagnosis not present

## 2014-08-03 DIAGNOSIS — D509 Iron deficiency anemia, unspecified: Secondary | ICD-10-CM | POA: Diagnosis not present

## 2014-08-03 DIAGNOSIS — E119 Type 2 diabetes mellitus without complications: Secondary | ICD-10-CM | POA: Diagnosis not present

## 2014-08-03 DIAGNOSIS — N186 End stage renal disease: Secondary | ICD-10-CM | POA: Diagnosis not present

## 2014-08-03 DIAGNOSIS — D631 Anemia in chronic kidney disease: Secondary | ICD-10-CM | POA: Diagnosis not present

## 2014-08-05 DIAGNOSIS — E0840 Diabetes mellitus due to underlying condition with diabetic neuropathy, unspecified: Secondary | ICD-10-CM | POA: Diagnosis not present

## 2014-08-05 DIAGNOSIS — D509 Iron deficiency anemia, unspecified: Secondary | ICD-10-CM | POA: Diagnosis not present

## 2014-08-05 DIAGNOSIS — D631 Anemia in chronic kidney disease: Secondary | ICD-10-CM | POA: Diagnosis not present

## 2014-08-05 DIAGNOSIS — E119 Type 2 diabetes mellitus without complications: Secondary | ICD-10-CM | POA: Diagnosis not present

## 2014-08-05 DIAGNOSIS — N186 End stage renal disease: Secondary | ICD-10-CM | POA: Diagnosis not present

## 2014-08-05 DIAGNOSIS — N2581 Secondary hyperparathyroidism of renal origin: Secondary | ICD-10-CM | POA: Diagnosis not present

## 2014-08-08 DIAGNOSIS — E0840 Diabetes mellitus due to underlying condition with diabetic neuropathy, unspecified: Secondary | ICD-10-CM | POA: Diagnosis not present

## 2014-08-08 DIAGNOSIS — N186 End stage renal disease: Secondary | ICD-10-CM | POA: Diagnosis not present

## 2014-08-08 DIAGNOSIS — D509 Iron deficiency anemia, unspecified: Secondary | ICD-10-CM | POA: Diagnosis not present

## 2014-08-08 DIAGNOSIS — D631 Anemia in chronic kidney disease: Secondary | ICD-10-CM | POA: Diagnosis not present

## 2014-08-08 DIAGNOSIS — E119 Type 2 diabetes mellitus without complications: Secondary | ICD-10-CM | POA: Diagnosis not present

## 2014-08-08 DIAGNOSIS — N2581 Secondary hyperparathyroidism of renal origin: Secondary | ICD-10-CM | POA: Diagnosis not present

## 2014-08-10 DIAGNOSIS — D509 Iron deficiency anemia, unspecified: Secondary | ICD-10-CM | POA: Diagnosis not present

## 2014-08-10 DIAGNOSIS — N2581 Secondary hyperparathyroidism of renal origin: Secondary | ICD-10-CM | POA: Diagnosis not present

## 2014-08-10 DIAGNOSIS — D631 Anemia in chronic kidney disease: Secondary | ICD-10-CM | POA: Diagnosis not present

## 2014-08-10 DIAGNOSIS — N186 End stage renal disease: Secondary | ICD-10-CM | POA: Diagnosis not present

## 2014-08-10 DIAGNOSIS — E119 Type 2 diabetes mellitus without complications: Secondary | ICD-10-CM | POA: Diagnosis not present

## 2014-08-10 DIAGNOSIS — E0840 Diabetes mellitus due to underlying condition with diabetic neuropathy, unspecified: Secondary | ICD-10-CM | POA: Diagnosis not present

## 2014-08-10 DIAGNOSIS — E1129 Type 2 diabetes mellitus with other diabetic kidney complication: Secondary | ICD-10-CM | POA: Diagnosis not present

## 2014-08-12 DIAGNOSIS — N2581 Secondary hyperparathyroidism of renal origin: Secondary | ICD-10-CM | POA: Diagnosis not present

## 2014-08-12 DIAGNOSIS — D509 Iron deficiency anemia, unspecified: Secondary | ICD-10-CM | POA: Diagnosis not present

## 2014-08-12 DIAGNOSIS — N186 End stage renal disease: Secondary | ICD-10-CM | POA: Diagnosis not present

## 2014-08-12 DIAGNOSIS — E0840 Diabetes mellitus due to underlying condition with diabetic neuropathy, unspecified: Secondary | ICD-10-CM | POA: Diagnosis not present

## 2014-08-12 DIAGNOSIS — E119 Type 2 diabetes mellitus without complications: Secondary | ICD-10-CM | POA: Diagnosis not present

## 2014-08-12 DIAGNOSIS — D631 Anemia in chronic kidney disease: Secondary | ICD-10-CM | POA: Diagnosis not present

## 2014-08-14 DIAGNOSIS — N186 End stage renal disease: Secondary | ICD-10-CM | POA: Diagnosis not present

## 2014-08-14 DIAGNOSIS — Z992 Dependence on renal dialysis: Secondary | ICD-10-CM | POA: Diagnosis not present

## 2014-08-15 DIAGNOSIS — N2581 Secondary hyperparathyroidism of renal origin: Secondary | ICD-10-CM | POA: Diagnosis not present

## 2014-08-15 DIAGNOSIS — D509 Iron deficiency anemia, unspecified: Secondary | ICD-10-CM | POA: Diagnosis not present

## 2014-08-15 DIAGNOSIS — E0840 Diabetes mellitus due to underlying condition with diabetic neuropathy, unspecified: Secondary | ICD-10-CM | POA: Diagnosis not present

## 2014-08-15 DIAGNOSIS — E119 Type 2 diabetes mellitus without complications: Secondary | ICD-10-CM | POA: Diagnosis not present

## 2014-08-15 DIAGNOSIS — N186 End stage renal disease: Secondary | ICD-10-CM | POA: Diagnosis not present

## 2014-08-15 DIAGNOSIS — D631 Anemia in chronic kidney disease: Secondary | ICD-10-CM | POA: Diagnosis not present

## 2014-09-12 DIAGNOSIS — Z992 Dependence on renal dialysis: Secondary | ICD-10-CM | POA: Diagnosis not present

## 2014-09-12 DIAGNOSIS — N186 End stage renal disease: Secondary | ICD-10-CM | POA: Diagnosis not present

## 2014-09-14 DIAGNOSIS — D631 Anemia in chronic kidney disease: Secondary | ICD-10-CM | POA: Diagnosis not present

## 2014-09-14 DIAGNOSIS — E0840 Diabetes mellitus due to underlying condition with diabetic neuropathy, unspecified: Secondary | ICD-10-CM | POA: Diagnosis not present

## 2014-09-14 DIAGNOSIS — N2581 Secondary hyperparathyroidism of renal origin: Secondary | ICD-10-CM | POA: Diagnosis not present

## 2014-09-14 DIAGNOSIS — N186 End stage renal disease: Secondary | ICD-10-CM | POA: Diagnosis not present

## 2014-09-14 DIAGNOSIS — D509 Iron deficiency anemia, unspecified: Secondary | ICD-10-CM | POA: Diagnosis not present

## 2014-09-26 ENCOUNTER — Ambulatory Visit: Payer: Self-pay | Admitting: Vascular Surgery

## 2014-09-26 DIAGNOSIS — T82898A Other specified complication of vascular prosthetic devices, implants and grafts, initial encounter: Secondary | ICD-10-CM | POA: Diagnosis not present

## 2014-09-26 DIAGNOSIS — N186 End stage renal disease: Secondary | ICD-10-CM | POA: Diagnosis not present

## 2014-09-26 DIAGNOSIS — I120 Hypertensive chronic kidney disease with stage 5 chronic kidney disease or end stage renal disease: Secondary | ICD-10-CM | POA: Diagnosis not present

## 2014-09-26 DIAGNOSIS — E119 Type 2 diabetes mellitus without complications: Secondary | ICD-10-CM | POA: Diagnosis not present

## 2014-10-13 DIAGNOSIS — N186 End stage renal disease: Secondary | ICD-10-CM | POA: Diagnosis not present

## 2014-10-13 DIAGNOSIS — E1129 Type 2 diabetes mellitus with other diabetic kidney complication: Secondary | ICD-10-CM | POA: Diagnosis not present

## 2014-10-13 DIAGNOSIS — Z992 Dependence on renal dialysis: Secondary | ICD-10-CM | POA: Diagnosis not present

## 2014-10-14 DIAGNOSIS — D631 Anemia in chronic kidney disease: Secondary | ICD-10-CM | POA: Diagnosis not present

## 2014-10-14 DIAGNOSIS — N186 End stage renal disease: Secondary | ICD-10-CM | POA: Diagnosis not present

## 2014-10-14 DIAGNOSIS — N2581 Secondary hyperparathyroidism of renal origin: Secondary | ICD-10-CM | POA: Diagnosis not present

## 2014-10-14 DIAGNOSIS — E0840 Diabetes mellitus due to underlying condition with diabetic neuropathy, unspecified: Secondary | ICD-10-CM | POA: Diagnosis not present

## 2014-10-14 DIAGNOSIS — D509 Iron deficiency anemia, unspecified: Secondary | ICD-10-CM | POA: Diagnosis not present

## 2014-11-05 NOTE — Op Note (Signed)
PATIENT NAME:  [name], [name] MR#:  [id number] DATE OF BIRTH:  August 31, 1965  DATE OF PROCEDURE:  02/17/2014  PREOPERATIVE DIAGNOSES: 1.  End-stage renal disease.  2.  Multiple failed previous dialysis accesses.   POSTOPERATIVE DIAGNOSES: 1.  End-stage renal disease.  2.  Multiple failed previous dialysis accesses.   PROCEDURE: 1.  Ultrasound guidance for vascular access to left jugular vein and left subclavian vein.  2.  Fluoroscopic guidance for placement of catheter.  3.  Placement of a left arm HeRO graft with left brachial artery PTFE graft connected to  silicone portion entering into left subclavian vein terminating in the right atrium. 4.  Left subclavian venogram and central venogram  SURGEON: Algernon Huxley, M.D.   ANESTHESIA: General.   ESTIMATED BLOOD LOSS: 30 mL.   INDICATION FOR PROCEDURE: This is a gentleman with multiple failed previous dialysis access. He presents for left arm HeRO graft. The risks and benefits were discussed. Informed consent was obtained.   DESCRIPTION OF PROCEDURE: The patient is brought to the operative suite. The left upper extremity and neck were sterilely prepped and draped and a sterile surgical field was created. The previous ultrasound had shown the left jugular vein to be occluded. I tried to access this at just above the clavicle without success. Under direct ultrasound guidance, I then made a cut down in the axillary subclavian location on the left that would serve our placement of the connector of the 2 portions and I gained access through this incision under ultrasound guidance in the left subclavian vein with a micropuncture needle, and micropuncture wire and sheath were placed. Imaging was performed through the micropuncture sheath on the left subclavian vein showing this to be in the left subclavian vein and showing the outflow to be normal and the innominate and superior vena cava into the right atrium. I sequentially dilated over an Amplatz  Super Stiff wire and parked the silicone central portion of the HeRO graft in the right atrium and clamped this. I then created an antecubital incision and dissected out the brachial artery at its bifurcation, encircling this with vessel loops.  I then tunneled from the antecubital incision to the axillary incision at the shoulder and heparinized the patient. The silicone portion was connected to the PTFE portion. Fluoroscopy was used to park the catheter tip in the right atrium and this allowed [place] to cut the graft to an appropriate length to match an anterior wall arteriotomy that was created with an 11 blade and extended with Potts scissors. The arterial mass was created with a running CV-6 suture in the usual fashion. The vessel was flushed and de-aired prior to releasing control. On release, there was some slight needle hole bleeding that was controlled with pressure.  Surgicel and Evicel topical hemostatic agents were placed hemostasis complete. The wounds were closed with a 3-0 Vicryl and 4-0 Monocryl and Dermabond was placed as a dressing. The patient tolerated the procedure well and was taken to the recovery room in stable condition.    ____________________________ Algernon Huxley, MD jsd:ds D: 02/17/2014 17:28:59 ET T: 02/17/2014 21:00:46 ET JOB#: [id number]  cc: Algernon Huxley, MD, <Dictator> Algernon Huxley MD ELECTRONICALLY SIGNED 02/23/2014 14:26

## 2014-11-09 DIAGNOSIS — E1129 Type 2 diabetes mellitus with other diabetic kidney complication: Secondary | ICD-10-CM | POA: Diagnosis not present

## 2014-11-12 DIAGNOSIS — Z992 Dependence on renal dialysis: Secondary | ICD-10-CM | POA: Diagnosis not present

## 2014-11-12 DIAGNOSIS — N186 End stage renal disease: Secondary | ICD-10-CM | POA: Diagnosis not present

## 2014-11-12 DIAGNOSIS — E1129 Type 2 diabetes mellitus with other diabetic kidney complication: Secondary | ICD-10-CM | POA: Diagnosis not present

## 2014-11-13 NOTE — Op Note (Signed)
PATIENT NAME:  [name], [name] MR#:  [id number] DATE OF BIRTH:  1965-08-28  DATE OF PROCEDURE:  09/26/2014  PREOPERATIVE DIAGNOSES:  1.  End-stage renal disease.  2.  Clotted left arm HeRO graft.  3.  Hypertension.  4.  Diabetes.   POSTOPERATIVE DIAGNOSES:  1.  End-stage renal disease.  2.  Clotted left arm HeRO graft.  3.  Hypertension.  4.  Diabetes.   PROCEDURE: 1.  Ultrasound guidance for vascular access in both an antegrade and a retrograde fashion to left arm HeRO graft.  2.  Left upper extremity shuntogram and central venogram.  3.  Catheter directed thrombolysis with 4 mg of t-PA delivered throughout the polytetrafluorethylene portion of the HeRO graft with the AngioJet AVX catheter.  4.  Mechanical rheolytic thrombectomy to the portion of hero graft with AngioJet AVX catheter.  5.  Fogarty embolectomy for arterial plug.   6.  Percutaneous transluminal angioplasty of mid graft with 8 mm diameter high-pressure angioplasty balloon.  7.  Viabahn covered stent placement with 8 mm diameter x 10 cm length Viabahn covered stent for residual stenosis and thrombus after angioplasty.   SURGEON: Algernon Huxley, M.D.   ANESTHESIA: Local with moderate conscious sedation.   ESTIMATED BLOOD LOSS: 25 mL.   INDICATION FOR PROCEDURE: This is a 49 year old gentleman with end-stage renal disease. His HeRO graft clotted over the weekend. We are attempting to salvage this today. Risks and benefits were discussed. Informed consent was obtained.   DESCRIPTION OF PROCEDURE: The patient is brought to the vascular suite. The left upper extremity was sterilely prepped and draped, and a sterile surgical field was created. The graft was accessed first in the retrograde fashion in the proximal upper arm.  Under direct ultrasound guidance with a micropuncture needle, a micropuncture wire and sheath were then placed, and we upsized to a 6 [place] sheath. The sheath had no blood return.  The graft was clearly  thrombosed.  I then accessed the graft in an antegrade fashion several centimeters beyond the anastomosis with a micropuncture needle under direct ultrasound guidance and again a permanent image was recorded. A micropuncture wire and sheath were then placed and we upsized to a 6 French sheath in an antegrade direction as well.  The sheaths were at a crossing orientation allowing access to the entirety of the graft. Imaging showed a thrombosed graft with what appeared to be a very high-grade narrowing in the midportion of the graft at what is likely the venous access site. The patient was given 4000 units of intravenous heparin.  I put a Magic torque wire in the brachial artery and a Magic torque wire into the right atrium, and then instilled 4 mg of t-PA with the AngioJet AVX catheter throughout the PTFE portion of the graft. This was allowed to dwell for approximately 10 to 15 minutes and a Fogarty embolectomy was used to remove the arterial plug. This resulted in successful resolution of the thrombus at the arterial portion of the graft and at the anastomosis, and at this point the retrograde sheath could be removed. This was removed, and a 4-0 Monocryl purse string suture was placed. However, the graft was still thrombosed in the midportion which actually allowed a detailed imaging of the brachial artery in the proximal portion of the graft. Mechanical rheolytic thrombectomy was run for approximately 50 mL of effluent returned in the HeRO graft. Following this, there was improved flow; however, the residual stenosis which was likely the culprit lesion  causing the graft to thrombose in the first place was still present. I treated this lesion with an 8 mm diameter x 10 cm in length high-pressure angioplasty balloon. This resulted in improved flow, but there was still significant irregularity and stenosis at the most narrowed portion of the graft and some residual thrombus.  I elected to cover this area with a  Viabahn covered stent upsized to a 7 French sheath and an 0.018 wire using an 8 mm diameter x 10 cm length Viabahn covered stent post dilated with an 8 mm balloon with an excellent angiographic completion result and no significant residual stenosis. At this point, the graft had a good palpable thrill, was widely patent, and I elected to terminate the procedure. The sheath was removed, 4-0 Monocryl purse string suture was placed. Pressure was held. Sterile dressing was placed. The patient tolerated the procedure well and was taken to the recovery room in stable condition.   ___________________________ Algernon Huxley, MD jsd:sp D: 09/26/2014 15:05:43 ET T: 09/26/2014 15:32:05 ET JOB#: [id number]  cc: Algernon Huxley, MD, <Dictator> Algernon Huxley MD ELECTRONICALLY SIGNED 10/03/2014 15:00

## 2014-11-14 DIAGNOSIS — N186 End stage renal disease: Secondary | ICD-10-CM | POA: Diagnosis not present

## 2014-11-14 DIAGNOSIS — E0840 Diabetes mellitus due to underlying condition with diabetic neuropathy, unspecified: Secondary | ICD-10-CM | POA: Diagnosis not present

## 2014-11-14 DIAGNOSIS — N2581 Secondary hyperparathyroidism of renal origin: Secondary | ICD-10-CM | POA: Diagnosis not present

## 2014-11-14 DIAGNOSIS — E8770 Fluid overload, unspecified: Secondary | ICD-10-CM | POA: Diagnosis not present

## 2014-11-14 DIAGNOSIS — D509 Iron deficiency anemia, unspecified: Secondary | ICD-10-CM | POA: Diagnosis not present

## 2014-11-14 DIAGNOSIS — D631 Anemia in chronic kidney disease: Secondary | ICD-10-CM | POA: Diagnosis not present

## 2014-11-14 DIAGNOSIS — D508 Other iron deficiency anemias: Secondary | ICD-10-CM | POA: Diagnosis not present

## 2014-12-02 DIAGNOSIS — N186 End stage renal disease: Secondary | ICD-10-CM | POA: Diagnosis not present

## 2014-12-02 DIAGNOSIS — E1121 Type 2 diabetes mellitus with diabetic nephropathy: Secondary | ICD-10-CM | POA: Diagnosis not present

## 2014-12-02 DIAGNOSIS — L97521 Non-pressure chronic ulcer of other part of left foot limited to breakdown of skin: Secondary | ICD-10-CM | POA: Diagnosis not present

## 2014-12-02 DIAGNOSIS — E668 Other obesity: Secondary | ICD-10-CM | POA: Diagnosis not present

## 2014-12-02 DIAGNOSIS — E1165 Type 2 diabetes mellitus with hyperglycemia: Secondary | ICD-10-CM | POA: Diagnosis not present

## 2014-12-02 DIAGNOSIS — Z794 Long term (current) use of insulin: Secondary | ICD-10-CM | POA: Diagnosis not present

## 2014-12-02 DIAGNOSIS — E11621 Type 2 diabetes mellitus with foot ulcer: Secondary | ICD-10-CM | POA: Diagnosis not present

## 2014-12-02 DIAGNOSIS — Z6841 Body Mass Index (BMI) 40.0 and over, adult: Secondary | ICD-10-CM | POA: Diagnosis not present

## 2014-12-13 DIAGNOSIS — E1129 Type 2 diabetes mellitus with other diabetic kidney complication: Secondary | ICD-10-CM | POA: Diagnosis not present

## 2014-12-13 DIAGNOSIS — N186 End stage renal disease: Secondary | ICD-10-CM | POA: Diagnosis not present

## 2014-12-13 DIAGNOSIS — Z992 Dependence on renal dialysis: Secondary | ICD-10-CM | POA: Diagnosis not present

## 2014-12-14 DIAGNOSIS — N186 End stage renal disease: Secondary | ICD-10-CM | POA: Diagnosis not present

## 2014-12-14 DIAGNOSIS — E8770 Fluid overload, unspecified: Secondary | ICD-10-CM | POA: Diagnosis not present

## 2014-12-14 DIAGNOSIS — D631 Anemia in chronic kidney disease: Secondary | ICD-10-CM | POA: Diagnosis not present

## 2014-12-14 DIAGNOSIS — T82598D Other mechanical complication of other cardiac and vascular devices and implants, subsequent encounter: Secondary | ICD-10-CM | POA: Diagnosis not present

## 2014-12-14 DIAGNOSIS — D508 Other iron deficiency anemias: Secondary | ICD-10-CM | POA: Diagnosis not present

## 2014-12-14 DIAGNOSIS — E119 Type 2 diabetes mellitus without complications: Secondary | ICD-10-CM | POA: Diagnosis not present

## 2014-12-14 DIAGNOSIS — D509 Iron deficiency anemia, unspecified: Secondary | ICD-10-CM | POA: Diagnosis not present

## 2014-12-14 DIAGNOSIS — N2581 Secondary hyperparathyroidism of renal origin: Secondary | ICD-10-CM | POA: Diagnosis not present

## 2014-12-15 ENCOUNTER — Encounter (HOSPITAL_BASED_OUTPATIENT_CLINIC_OR_DEPARTMENT_OTHER): Payer: Medicare Other | Attending: Internal Medicine

## 2014-12-15 DIAGNOSIS — E1121 Type 2 diabetes mellitus with diabetic nephropathy: Secondary | ICD-10-CM | POA: Insufficient documentation

## 2014-12-15 DIAGNOSIS — L97521 Non-pressure chronic ulcer of other part of left foot limited to breakdown of skin: Secondary | ICD-10-CM | POA: Insufficient documentation

## 2014-12-15 DIAGNOSIS — H2513 Age-related nuclear cataract, bilateral: Secondary | ICD-10-CM | POA: Diagnosis not present

## 2014-12-15 DIAGNOSIS — Z992 Dependence on renal dialysis: Secondary | ICD-10-CM | POA: Insufficient documentation

## 2014-12-15 DIAGNOSIS — H40003 Preglaucoma, unspecified, bilateral: Secondary | ICD-10-CM | POA: Diagnosis not present

## 2014-12-15 DIAGNOSIS — H18413 Arcus senilis, bilateral: Secondary | ICD-10-CM | POA: Diagnosis not present

## 2014-12-15 DIAGNOSIS — E11621 Type 2 diabetes mellitus with foot ulcer: Secondary | ICD-10-CM | POA: Insufficient documentation

## 2014-12-15 DIAGNOSIS — L03032 Cellulitis of left toe: Secondary | ICD-10-CM | POA: Diagnosis not present

## 2014-12-15 DIAGNOSIS — H11153 Pinguecula, bilateral: Secondary | ICD-10-CM | POA: Diagnosis not present

## 2014-12-15 DIAGNOSIS — H35372 Puckering of macula, left eye: Secondary | ICD-10-CM | POA: Diagnosis not present

## 2014-12-15 DIAGNOSIS — E1142 Type 2 diabetes mellitus with diabetic polyneuropathy: Secondary | ICD-10-CM | POA: Diagnosis not present

## 2014-12-15 DIAGNOSIS — F17200 Nicotine dependence, unspecified, uncomplicated: Secondary | ICD-10-CM | POA: Insufficient documentation

## 2014-12-15 DIAGNOSIS — I10 Essential (primary) hypertension: Secondary | ICD-10-CM | POA: Diagnosis not present

## 2014-12-15 DIAGNOSIS — Z794 Long term (current) use of insulin: Secondary | ICD-10-CM | POA: Diagnosis not present

## 2014-12-15 DIAGNOSIS — H40013 Open angle with borderline findings, low risk, bilateral: Secondary | ICD-10-CM | POA: Diagnosis not present

## 2014-12-15 DIAGNOSIS — H25013 Cortical age-related cataract, bilateral: Secondary | ICD-10-CM | POA: Diagnosis not present

## 2014-12-15 DIAGNOSIS — L97321 Non-pressure chronic ulcer of left ankle limited to breakdown of skin: Secondary | ICD-10-CM | POA: Diagnosis not present

## 2014-12-16 ENCOUNTER — Ambulatory Visit
Admission: RE | Admit: 2014-12-16 | Discharge: 2014-12-16 | Disposition: A | Discharge status: Home / Self Care | Payer: Medicare Other | Source: Ambulatory Visit | Attending: Internal Medicine | Admitting: Internal Medicine

## 2014-12-16 ENCOUNTER — Other Ambulatory Visit: Payer: Self-pay | Admitting: Internal Medicine

## 2014-12-16 DIAGNOSIS — N186 End stage renal disease: Secondary | ICD-10-CM | POA: Diagnosis not present

## 2014-12-16 DIAGNOSIS — D631 Anemia in chronic kidney disease: Secondary | ICD-10-CM | POA: Diagnosis not present

## 2014-12-16 DIAGNOSIS — T82598D Other mechanical complication of other cardiac and vascular devices and implants, subsequent encounter: Secondary | ICD-10-CM | POA: Diagnosis not present

## 2014-12-16 DIAGNOSIS — M869 Osteomyelitis, unspecified: Secondary | ICD-10-CM

## 2014-12-16 DIAGNOSIS — E119 Type 2 diabetes mellitus without complications: Secondary | ICD-10-CM | POA: Diagnosis not present

## 2014-12-16 DIAGNOSIS — N2581 Secondary hyperparathyroidism of renal origin: Secondary | ICD-10-CM | POA: Diagnosis not present

## 2014-12-16 DIAGNOSIS — D508 Other iron deficiency anemias: Secondary | ICD-10-CM | POA: Diagnosis not present

## 2014-12-19 DIAGNOSIS — N186 End stage renal disease: Secondary | ICD-10-CM | POA: Diagnosis not present

## 2014-12-19 DIAGNOSIS — D508 Other iron deficiency anemias: Secondary | ICD-10-CM | POA: Diagnosis not present

## 2014-12-19 DIAGNOSIS — T82598D Other mechanical complication of other cardiac and vascular devices and implants, subsequent encounter: Secondary | ICD-10-CM | POA: Diagnosis not present

## 2014-12-19 DIAGNOSIS — N2581 Secondary hyperparathyroidism of renal origin: Secondary | ICD-10-CM | POA: Diagnosis not present

## 2014-12-19 DIAGNOSIS — D631 Anemia in chronic kidney disease: Secondary | ICD-10-CM | POA: Diagnosis not present

## 2014-12-19 DIAGNOSIS — E119 Type 2 diabetes mellitus without complications: Secondary | ICD-10-CM | POA: Diagnosis not present

## 2014-12-21 DIAGNOSIS — N2581 Secondary hyperparathyroidism of renal origin: Secondary | ICD-10-CM | POA: Diagnosis not present

## 2014-12-21 DIAGNOSIS — D508 Other iron deficiency anemias: Secondary | ICD-10-CM | POA: Diagnosis not present

## 2014-12-21 DIAGNOSIS — E119 Type 2 diabetes mellitus without complications: Secondary | ICD-10-CM | POA: Diagnosis not present

## 2014-12-21 DIAGNOSIS — T82598D Other mechanical complication of other cardiac and vascular devices and implants, subsequent encounter: Secondary | ICD-10-CM | POA: Diagnosis not present

## 2014-12-21 DIAGNOSIS — N186 End stage renal disease: Secondary | ICD-10-CM | POA: Diagnosis not present

## 2014-12-21 DIAGNOSIS — D631 Anemia in chronic kidney disease: Secondary | ICD-10-CM | POA: Diagnosis not present

## 2014-12-22 DIAGNOSIS — E11621 Type 2 diabetes mellitus with foot ulcer: Secondary | ICD-10-CM | POA: Diagnosis not present

## 2014-12-22 DIAGNOSIS — L97521 Non-pressure chronic ulcer of other part of left foot limited to breakdown of skin: Secondary | ICD-10-CM | POA: Diagnosis not present

## 2014-12-22 DIAGNOSIS — E1142 Type 2 diabetes mellitus with diabetic polyneuropathy: Secondary | ICD-10-CM | POA: Diagnosis not present

## 2014-12-22 DIAGNOSIS — L03032 Cellulitis of left toe: Secondary | ICD-10-CM | POA: Diagnosis not present

## 2014-12-23 DIAGNOSIS — N2581 Secondary hyperparathyroidism of renal origin: Secondary | ICD-10-CM | POA: Diagnosis not present

## 2014-12-23 DIAGNOSIS — E119 Type 2 diabetes mellitus without complications: Secondary | ICD-10-CM | POA: Diagnosis not present

## 2014-12-23 DIAGNOSIS — D508 Other iron deficiency anemias: Secondary | ICD-10-CM | POA: Diagnosis not present

## 2014-12-23 DIAGNOSIS — N186 End stage renal disease: Secondary | ICD-10-CM | POA: Diagnosis not present

## 2014-12-23 DIAGNOSIS — D631 Anemia in chronic kidney disease: Secondary | ICD-10-CM | POA: Diagnosis not present

## 2014-12-23 DIAGNOSIS — T82598D Other mechanical complication of other cardiac and vascular devices and implants, subsequent encounter: Secondary | ICD-10-CM | POA: Diagnosis not present

## 2014-12-24 ENCOUNTER — Emergency Department (HOSPITAL_COMMUNITY)
Admission: EM | Admit: 2014-12-24 | Discharge: 2014-12-24 | Disposition: A | Discharge status: Home / Self Care | Payer: Medicare Other | Attending: Emergency Medicine | Admitting: Emergency Medicine

## 2014-12-24 ENCOUNTER — Encounter (HOSPITAL_COMMUNITY): Payer: Self-pay | Admitting: Emergency Medicine

## 2014-12-24 DIAGNOSIS — L97529 Non-pressure chronic ulcer of other part of left foot with unspecified severity: Secondary | ICD-10-CM | POA: Diagnosis not present

## 2014-12-24 DIAGNOSIS — Z79899 Other long term (current) drug therapy: Secondary | ICD-10-CM | POA: Diagnosis not present

## 2014-12-24 DIAGNOSIS — M79672 Pain in left foot: Secondary | ICD-10-CM | POA: Diagnosis present

## 2014-12-24 DIAGNOSIS — Z4689 Encounter for fitting and adjustment of other specified devices: Secondary | ICD-10-CM | POA: Diagnosis not present

## 2014-12-24 DIAGNOSIS — Z7901 Long term (current) use of anticoagulants: Secondary | ICD-10-CM | POA: Diagnosis not present

## 2014-12-24 DIAGNOSIS — Z8709 Personal history of other diseases of the respiratory system: Secondary | ICD-10-CM | POA: Insufficient documentation

## 2014-12-24 DIAGNOSIS — I120 Hypertensive chronic kidney disease with stage 5 chronic kidney disease or end stage renal disease: Secondary | ICD-10-CM | POA: Diagnosis not present

## 2014-12-24 DIAGNOSIS — E11621 Type 2 diabetes mellitus with foot ulcer: Secondary | ICD-10-CM | POA: Diagnosis not present

## 2014-12-24 DIAGNOSIS — Z8719 Personal history of other diseases of the digestive system: Secondary | ICD-10-CM | POA: Diagnosis not present

## 2014-12-24 DIAGNOSIS — Z4789 Encounter for other orthopedic aftercare: Secondary | ICD-10-CM | POA: Diagnosis not present

## 2014-12-24 DIAGNOSIS — Z992 Dependence on renal dialysis: Secondary | ICD-10-CM | POA: Diagnosis not present

## 2014-12-24 DIAGNOSIS — Z720 Tobacco use: Secondary | ICD-10-CM | POA: Insufficient documentation

## 2014-12-24 DIAGNOSIS — Z794 Long term (current) use of insulin: Secondary | ICD-10-CM | POA: Insufficient documentation

## 2014-12-24 DIAGNOSIS — N186 End stage renal disease: Secondary | ICD-10-CM | POA: Diagnosis not present

## 2014-12-24 NOTE — ED Notes (Signed)
Ortho tech at bedside for cast removal

## 2014-12-24 NOTE — ED Provider Notes (Signed)
CSN: [id number]     Arrival date & time 12/24/14  0522 History   First MD Initiated Contact with Patient 12/24/14 [phone]     Chief Complaint  Patient presents with  . Foot Pain     (Consider location/radiation/quality/duration/timing/severity/associated sxs/prior Treatment) HPI  Pt is a 49yo male with hx of DM, HTN, morbid obesity, and diabetic ulcer on Left foot, presenting to ED with c/o gradually worsening Left leg pain after he had a cast placed 2 days ago at the wound center to promote healing.  Pt was advised to come to the ED and have cast removed if pain became worse.  Pt c/o rubbing and irritation on anterior part of Left shin from the cast as well as diffuse pain in his Left foot.  Pain is aching and sore, 9/10. Pt states he takes percocet for pain.  Denies fever, chills, n/v/d. No new injuries. Pt states he has a f/u appointment scheduled for Monday, 12/26/14.  Past Medical History  Diagnosis Date  . Diabetes mellitus   . Hypertension   . Chest pain   . Morbid obesity   . Bronchitis   . Blood transfusion   . H/O hiatal hernia   . Chronic kidney disease     d[facility], [facility]  . GERD (gastroesophageal reflux disease)   . Dysrhythmia     irregular heartbeat  . Hyperparathyroidism   . ESRD (end stage renal disease) on dialysis     first HD 06/19/04   Past Surgical History  Procedure Laterality Date  . Av fistula placement  10/11/2010  . Ankle fusion      rt foot charcot joint 2008 with refusion  . Av fistula placement  07/02/2011    Procedure: INSERTION OF ARTERIOVENOUS (AV) GORE-TEX GRAFT ARM;  Surgeon: Elam Dutch, MD;  Location: [place];  Service: Vascular;  Laterality: Left;  . Thrombectomy    . Insertion of dialysis catheter  01/20/2012    Procedure: INSERTION OF DIALYSIS CATHETER;  Surgeon: Serafina Mitchell, MD;  Location: [place];  Service: Vascular;  Laterality: N/A;  insertion left internal jugular dialysis catheter 23cm  . Umbilcial hernia    .  Parathyroidectomy    . Avgg removal Right 11/06/2012    Procedure: REMOVAL OF ARTERIOVENOUS GORETEX GRAFT ([place]);  Surgeon: Mal Misty, MD;  Location: [place];  Service: Vascular;  Laterality: Right;  . Av fistula placement Right 11/06/2012    Procedure: INSERTION OF ARTERIOVENOUS (AV) GORE-TEX GRAFT ARM;  Surgeon: Mal Misty, MD;  Location: [place];  Service: Vascular;  Laterality: Right;  . Insertion of dialysis catheter Right 11/09/2012    Procedure: INSERTION OF DIALYSIS CATHETER;  Surgeon: Conrad [place], MD;  Location: [place];  Service: Vascular;  Laterality: Right;  Right  Femoral Vein  . Venogram N/A 01/06/2014    Procedure: VENOGRAM;  Surgeon: Conrad [place], MD;  Location: [name] CATH LAB;  Service: Cardiovascular;  Laterality: N/A;   Family History  Problem Relation Age of Onset  . Heart disease Mother   . Kidney disease Mother   . Hypertension Mother    History  Substance Use Topics  . Smoking status: Current Some Day Smoker -- 0.10 packs/day for 20 years    Types: Cigarettes  . Smokeless tobacco: Never Used     Comment: pt states he only smokes about 3 cigs per day  . Alcohol Use: No    Review of Systems  Constitutional: Negative for fever and  chills.  Musculoskeletal: Positive for myalgias. Negative for back pain and arthralgias.  Skin: Positive for wound.  All other systems reviewed and are negative.     Allergies  Review of patient's allergies indicates no known allergies.  Home Medications   Prior to Admission medications   Medication Sig Start Date End Date Taking? Authorizing Provider  b complex-vitamin c-folic acid (NEPHRO-VITE) 0.8 MG TABS Take 0.8 mg by mouth at bedtime.     Yes Historical Provider, MD  calcium acetate (PHOSLO) 667 MG capsule Take 2,668 mg by mouth 3 (three) times daily with meals.   Yes Historical Provider, MD  LEVEMIR FLEXTOUCH 100 UNIT/ML Pen Inject 35 Units into the skin 2 (two) times daily.  12/08/13  Yes Historical Provider, MD   oxyCODONE-acetaminophen (PERCOCET) 10-325 MG per tablet Take 1-2 tablets by mouth every 4 (four) hours as needed for pain.   Yes Historical Provider, MD  repaglinide (PRANDIN) 0.5 MG tablet Take 0.5 mg by mouth 2 (two) times daily before a meal.  12/08/13  Yes Historical Provider, MD  BD PEN NEEDLE NANO U/F 32G X 4 MM MISC  10/14/13   Historical Provider, MD  warfarin (COUMADIN) 5 MG tablet Take 5-7.5 mg by mouth daily. Monday Wednesday and Friday take 7.5mg  once daily and all other days take 5mg  12/08/13   Historical Provider, MD   BP 136/75 mmHg  Pulse 99  Temp(Src) 98.6 F (37 C) (Oral)  Resp 18  SpO2 98% Physical Exam  Constitutional: He is oriented to person, place, and time. He appears well-developed and well-nourished.  HENT:  Head: Normocephalic and atraumatic.  Eyes: EOM are normal.  Neck: Normal range of motion.  Cardiovascular: Normal rate.   Pulmonary/Chest: Effort normal.  Musculoskeletal:  Left lower leg: in hard cast, post-op shoe in place over hard splint.  FROM Left knee, no tenderness to exposed calf.   FROM Left ankle and toes after cast removed.  Neurological: He is alert and oriented to person, place, and time.  Skin: Skin is warm and dry.  Left great toe: large bandage in place, small amount of brown discharge on bottom of bandage. Pt refused to have bandage removed and replaced. Left anterior shin and dorsal medial aspect of Left foot- 0.5cm superficial abrasions c/w friction rub/irritation from cast. No active bleeding.  Psychiatric: He has a normal mood and affect. His behavior is normal.  Nursing note and vitals reviewed.   ED Course  Procedures (including critical care time) Labs Review Labs Reviewed - No data to display  Imaging Review No results found.   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 Advised to come to ED for cast removal if pain worsened.  6:38 AM pt agitated due to long wait.  apologized to pt for wait, explained the ortho tech would need to be called in to come remove his splint, then advised this provider would be able to examine the ulcer underneath the splint.   Cast was removed, FROM Left foot and toes. Bandage in place, wound appears to be draining through bandage, pt refused to have bandaged removed and replaced. States he has f/u with wound care on Monday, 6/13.  Pt  also has 2 superficial abrasions on Left foot and Left shin, c/w friction rub from the cast. Bacitracin applied to wounds. Home care instructions provided. Return precautions provided. Pt verbalized understanding and agreement with tx plan.      Noland Fordyce, PA-C 12/24/14 [id number]  Pamella Pert, MD 12/25/14 [phone]

## 2014-12-24 NOTE — ED Notes (Signed)
Informed patient that ortho tech is in route

## 2014-12-24 NOTE — ED Notes (Addendum)
Splint on left lower leg, denies pain at this time.

## 2014-12-24 NOTE — ED Notes (Signed)
Pt presents c/o pain d/t cast placed 2 days ago. Pt states he had cast placed at wound center to promote wound healing, told to come to ED and have cast removed if pain became worse.

## 2014-12-26 DIAGNOSIS — L97521 Non-pressure chronic ulcer of other part of left foot limited to breakdown of skin: Secondary | ICD-10-CM | POA: Diagnosis not present

## 2014-12-26 DIAGNOSIS — T82598D Other mechanical complication of other cardiac and vascular devices and implants, subsequent encounter: Secondary | ICD-10-CM | POA: Diagnosis not present

## 2014-12-26 DIAGNOSIS — N186 End stage renal disease: Secondary | ICD-10-CM | POA: Diagnosis not present

## 2014-12-26 DIAGNOSIS — D631 Anemia in chronic kidney disease: Secondary | ICD-10-CM | POA: Diagnosis not present

## 2014-12-26 DIAGNOSIS — N2581 Secondary hyperparathyroidism of renal origin: Secondary | ICD-10-CM | POA: Diagnosis not present

## 2014-12-26 DIAGNOSIS — E119 Type 2 diabetes mellitus without complications: Secondary | ICD-10-CM | POA: Diagnosis not present

## 2014-12-26 DIAGNOSIS — D508 Other iron deficiency anemias: Secondary | ICD-10-CM | POA: Diagnosis not present

## 2014-12-28 DIAGNOSIS — E119 Type 2 diabetes mellitus without complications: Secondary | ICD-10-CM | POA: Diagnosis not present

## 2014-12-28 DIAGNOSIS — N2581 Secondary hyperparathyroidism of renal origin: Secondary | ICD-10-CM | POA: Diagnosis not present

## 2014-12-28 DIAGNOSIS — N186 End stage renal disease: Secondary | ICD-10-CM | POA: Diagnosis not present

## 2014-12-28 DIAGNOSIS — D508 Other iron deficiency anemias: Secondary | ICD-10-CM | POA: Diagnosis not present

## 2014-12-28 DIAGNOSIS — D631 Anemia in chronic kidney disease: Secondary | ICD-10-CM | POA: Diagnosis not present

## 2014-12-28 DIAGNOSIS — T82598D Other mechanical complication of other cardiac and vascular devices and implants, subsequent encounter: Secondary | ICD-10-CM | POA: Diagnosis not present

## 2014-12-29 DIAGNOSIS — L97521 Non-pressure chronic ulcer of other part of left foot limited to breakdown of skin: Secondary | ICD-10-CM | POA: Diagnosis not present

## 2014-12-29 DIAGNOSIS — L03032 Cellulitis of left toe: Secondary | ICD-10-CM | POA: Diagnosis not present

## 2014-12-29 DIAGNOSIS — E11621 Type 2 diabetes mellitus with foot ulcer: Secondary | ICD-10-CM | POA: Diagnosis not present

## 2014-12-29 DIAGNOSIS — E1142 Type 2 diabetes mellitus with diabetic polyneuropathy: Secondary | ICD-10-CM | POA: Diagnosis not present

## 2014-12-30 DIAGNOSIS — N186 End stage renal disease: Secondary | ICD-10-CM | POA: Diagnosis not present

## 2014-12-30 DIAGNOSIS — E119 Type 2 diabetes mellitus without complications: Secondary | ICD-10-CM | POA: Diagnosis not present

## 2014-12-30 DIAGNOSIS — D631 Anemia in chronic kidney disease: Secondary | ICD-10-CM | POA: Diagnosis not present

## 2014-12-30 DIAGNOSIS — N2581 Secondary hyperparathyroidism of renal origin: Secondary | ICD-10-CM | POA: Diagnosis not present

## 2014-12-30 DIAGNOSIS — T82598D Other mechanical complication of other cardiac and vascular devices and implants, subsequent encounter: Secondary | ICD-10-CM | POA: Diagnosis not present

## 2014-12-30 DIAGNOSIS — D508 Other iron deficiency anemias: Secondary | ICD-10-CM | POA: Diagnosis not present

## 2015-01-02 DIAGNOSIS — E119 Type 2 diabetes mellitus without complications: Secondary | ICD-10-CM | POA: Diagnosis not present

## 2015-01-02 DIAGNOSIS — T82598D Other mechanical complication of other cardiac and vascular devices and implants, subsequent encounter: Secondary | ICD-10-CM | POA: Diagnosis not present

## 2015-01-02 DIAGNOSIS — N186 End stage renal disease: Secondary | ICD-10-CM | POA: Diagnosis not present

## 2015-01-02 DIAGNOSIS — D631 Anemia in chronic kidney disease: Secondary | ICD-10-CM | POA: Diagnosis not present

## 2015-01-02 DIAGNOSIS — D508 Other iron deficiency anemias: Secondary | ICD-10-CM | POA: Diagnosis not present

## 2015-01-02 DIAGNOSIS — N2581 Secondary hyperparathyroidism of renal origin: Secondary | ICD-10-CM | POA: Diagnosis not present

## 2015-01-04 DIAGNOSIS — E119 Type 2 diabetes mellitus without complications: Secondary | ICD-10-CM | POA: Diagnosis not present

## 2015-01-04 DIAGNOSIS — D631 Anemia in chronic kidney disease: Secondary | ICD-10-CM | POA: Diagnosis not present

## 2015-01-04 DIAGNOSIS — N2581 Secondary hyperparathyroidism of renal origin: Secondary | ICD-10-CM | POA: Diagnosis not present

## 2015-01-04 DIAGNOSIS — D508 Other iron deficiency anemias: Secondary | ICD-10-CM | POA: Diagnosis not present

## 2015-01-04 DIAGNOSIS — T82598D Other mechanical complication of other cardiac and vascular devices and implants, subsequent encounter: Secondary | ICD-10-CM | POA: Diagnosis not present

## 2015-01-04 DIAGNOSIS — N186 End stage renal disease: Secondary | ICD-10-CM | POA: Diagnosis not present

## 2015-01-05 DIAGNOSIS — E1142 Type 2 diabetes mellitus with diabetic polyneuropathy: Secondary | ICD-10-CM | POA: Diagnosis not present

## 2015-01-05 DIAGNOSIS — E11621 Type 2 diabetes mellitus with foot ulcer: Secondary | ICD-10-CM | POA: Diagnosis not present

## 2015-01-05 DIAGNOSIS — L97521 Non-pressure chronic ulcer of other part of left foot limited to breakdown of skin: Secondary | ICD-10-CM | POA: Diagnosis not present

## 2015-01-05 DIAGNOSIS — L03032 Cellulitis of left toe: Secondary | ICD-10-CM | POA: Diagnosis not present

## 2015-01-06 DIAGNOSIS — E119 Type 2 diabetes mellitus without complications: Secondary | ICD-10-CM | POA: Diagnosis not present

## 2015-01-06 DIAGNOSIS — N186 End stage renal disease: Secondary | ICD-10-CM | POA: Diagnosis not present

## 2015-01-06 DIAGNOSIS — D631 Anemia in chronic kidney disease: Secondary | ICD-10-CM | POA: Diagnosis not present

## 2015-01-06 DIAGNOSIS — D508 Other iron deficiency anemias: Secondary | ICD-10-CM | POA: Diagnosis not present

## 2015-01-06 DIAGNOSIS — N2581 Secondary hyperparathyroidism of renal origin: Secondary | ICD-10-CM | POA: Diagnosis not present

## 2015-01-06 DIAGNOSIS — T82598D Other mechanical complication of other cardiac and vascular devices and implants, subsequent encounter: Secondary | ICD-10-CM | POA: Diagnosis not present

## 2015-01-09 DIAGNOSIS — T82598D Other mechanical complication of other cardiac and vascular devices and implants, subsequent encounter: Secondary | ICD-10-CM | POA: Diagnosis not present

## 2015-01-09 DIAGNOSIS — E119 Type 2 diabetes mellitus without complications: Secondary | ICD-10-CM | POA: Diagnosis not present

## 2015-01-09 DIAGNOSIS — N2581 Secondary hyperparathyroidism of renal origin: Secondary | ICD-10-CM | POA: Diagnosis not present

## 2015-01-09 DIAGNOSIS — D508 Other iron deficiency anemias: Secondary | ICD-10-CM | POA: Diagnosis not present

## 2015-01-09 DIAGNOSIS — N186 End stage renal disease: Secondary | ICD-10-CM | POA: Diagnosis not present

## 2015-01-09 DIAGNOSIS — D631 Anemia in chronic kidney disease: Secondary | ICD-10-CM | POA: Diagnosis not present

## 2015-01-10 DIAGNOSIS — I871 Compression of vein: Secondary | ICD-10-CM | POA: Diagnosis not present

## 2015-01-10 DIAGNOSIS — Z992 Dependence on renal dialysis: Secondary | ICD-10-CM | POA: Diagnosis not present

## 2015-01-10 DIAGNOSIS — N186 End stage renal disease: Secondary | ICD-10-CM | POA: Diagnosis not present

## 2015-01-10 DIAGNOSIS — E11621 Type 2 diabetes mellitus with foot ulcer: Secondary | ICD-10-CM | POA: Diagnosis not present

## 2015-01-10 DIAGNOSIS — T82858D Stenosis of vascular prosthetic devices, implants and grafts, subsequent encounter: Secondary | ICD-10-CM | POA: Diagnosis not present

## 2015-01-10 DIAGNOSIS — R609 Edema, unspecified: Secondary | ICD-10-CM | POA: Diagnosis not present

## 2015-01-11 DIAGNOSIS — D508 Other iron deficiency anemias: Secondary | ICD-10-CM | POA: Diagnosis not present

## 2015-01-11 DIAGNOSIS — D631 Anemia in chronic kidney disease: Secondary | ICD-10-CM | POA: Diagnosis not present

## 2015-01-11 DIAGNOSIS — T82598D Other mechanical complication of other cardiac and vascular devices and implants, subsequent encounter: Secondary | ICD-10-CM | POA: Diagnosis not present

## 2015-01-11 DIAGNOSIS — N2581 Secondary hyperparathyroidism of renal origin: Secondary | ICD-10-CM | POA: Diagnosis not present

## 2015-01-11 DIAGNOSIS — N186 End stage renal disease: Secondary | ICD-10-CM | POA: Diagnosis not present

## 2015-01-11 DIAGNOSIS — E119 Type 2 diabetes mellitus without complications: Secondary | ICD-10-CM | POA: Diagnosis not present

## 2015-01-12 DIAGNOSIS — Z992 Dependence on renal dialysis: Secondary | ICD-10-CM | POA: Diagnosis not present

## 2015-01-12 DIAGNOSIS — D508 Other iron deficiency anemias: Secondary | ICD-10-CM | POA: Diagnosis not present

## 2015-01-12 DIAGNOSIS — N2581 Secondary hyperparathyroidism of renal origin: Secondary | ICD-10-CM | POA: Diagnosis not present

## 2015-01-12 DIAGNOSIS — E11621 Type 2 diabetes mellitus with foot ulcer: Secondary | ICD-10-CM | POA: Diagnosis not present

## 2015-01-12 DIAGNOSIS — L03032 Cellulitis of left toe: Secondary | ICD-10-CM | POA: Diagnosis not present

## 2015-01-12 DIAGNOSIS — E1142 Type 2 diabetes mellitus with diabetic polyneuropathy: Secondary | ICD-10-CM | POA: Diagnosis not present

## 2015-01-12 DIAGNOSIS — D631 Anemia in chronic kidney disease: Secondary | ICD-10-CM | POA: Diagnosis not present

## 2015-01-12 DIAGNOSIS — N186 End stage renal disease: Secondary | ICD-10-CM | POA: Diagnosis not present

## 2015-01-12 DIAGNOSIS — E119 Type 2 diabetes mellitus without complications: Secondary | ICD-10-CM | POA: Diagnosis not present

## 2015-01-12 DIAGNOSIS — L97521 Non-pressure chronic ulcer of other part of left foot limited to breakdown of skin: Secondary | ICD-10-CM | POA: Diagnosis not present

## 2015-01-12 DIAGNOSIS — T82598D Other mechanical complication of other cardiac and vascular devices and implants, subsequent encounter: Secondary | ICD-10-CM | POA: Diagnosis not present

## 2015-01-12 DIAGNOSIS — E1129 Type 2 diabetes mellitus with other diabetic kidney complication: Secondary | ICD-10-CM | POA: Diagnosis not present

## 2015-01-13 DIAGNOSIS — T82598D Other mechanical complication of other cardiac and vascular devices and implants, subsequent encounter: Secondary | ICD-10-CM | POA: Diagnosis not present

## 2015-01-13 DIAGNOSIS — D508 Other iron deficiency anemias: Secondary | ICD-10-CM | POA: Diagnosis not present

## 2015-01-13 DIAGNOSIS — E0840 Diabetes mellitus due to underlying condition with diabetic neuropathy, unspecified: Secondary | ICD-10-CM | POA: Diagnosis not present

## 2015-01-13 DIAGNOSIS — N2581 Secondary hyperparathyroidism of renal origin: Secondary | ICD-10-CM | POA: Diagnosis not present

## 2015-01-13 DIAGNOSIS — M868X7 Other osteomyelitis, ankle and foot: Secondary | ICD-10-CM | POA: Insufficient documentation

## 2015-01-13 DIAGNOSIS — E119 Type 2 diabetes mellitus without complications: Secondary | ICD-10-CM | POA: Diagnosis not present

## 2015-01-13 DIAGNOSIS — N186 End stage renal disease: Secondary | ICD-10-CM | POA: Diagnosis not present

## 2015-01-13 DIAGNOSIS — D509 Iron deficiency anemia, unspecified: Secondary | ICD-10-CM | POA: Diagnosis not present

## 2015-01-13 DIAGNOSIS — D631 Anemia in chronic kidney disease: Secondary | ICD-10-CM | POA: Diagnosis not present

## 2015-01-18 ENCOUNTER — Encounter (HOSPITAL_BASED_OUTPATIENT_CLINIC_OR_DEPARTMENT_OTHER): Payer: Medicare Other | Attending: Internal Medicine

## 2015-01-19 ENCOUNTER — Encounter (HOSPITAL_BASED_OUTPATIENT_CLINIC_OR_DEPARTMENT_OTHER): Payer: Medicare Other

## 2015-01-19 ENCOUNTER — Other Ambulatory Visit (HOSPITAL_COMMUNITY): Payer: Self-pay | Admitting: Orthopedic Surgery

## 2015-01-19 DIAGNOSIS — L97524 Non-pressure chronic ulcer of other part of left foot with necrosis of bone: Secondary | ICD-10-CM | POA: Diagnosis not present

## 2015-01-19 NOTE — Progress Notes (Signed)
Unable to reach pt by phone. Left pre-op instructions on voicemail, instructed pt to arrive at 3:30 PM, NPO after MN tonight, take Prozac with sip of water and not to take any of his diabetic medications in the AM.

## 2015-01-20 ENCOUNTER — Encounter (HOSPITAL_COMMUNITY): Payer: Self-pay | Admitting: Anesthesiology

## 2015-01-20 ENCOUNTER — Ambulatory Visit (HOSPITAL_COMMUNITY): Payer: Medicare Other

## 2015-01-20 ENCOUNTER — Ambulatory Visit (HOSPITAL_COMMUNITY): Payer: Medicare Other | Admitting: Certified Registered Nurse Anesthetist

## 2015-01-20 ENCOUNTER — Encounter (HOSPITAL_COMMUNITY)
Admission: RE | Disposition: A | Discharge status: Home / Self Care | Payer: Self-pay | Source: Ambulatory Visit | Attending: Orthopedic Surgery

## 2015-01-20 ENCOUNTER — Ambulatory Visit (HOSPITAL_COMMUNITY)
Admission: RE | Admit: 2015-01-20 | Discharge: 2015-01-20 | Disposition: A | Discharge status: Home / Self Care | Payer: Medicare Other | Source: Ambulatory Visit | Attending: Orthopedic Surgery | Admitting: Orthopedic Surgery

## 2015-01-20 DIAGNOSIS — F17210 Nicotine dependence, cigarettes, uncomplicated: Secondary | ICD-10-CM | POA: Insufficient documentation

## 2015-01-20 DIAGNOSIS — I120 Hypertensive chronic kidney disease with stage 5 chronic kidney disease or end stage renal disease: Secondary | ICD-10-CM | POA: Diagnosis not present

## 2015-01-20 DIAGNOSIS — M868X7 Other osteomyelitis, ankle and foot: Secondary | ICD-10-CM | POA: Diagnosis not present

## 2015-01-20 DIAGNOSIS — I517 Cardiomegaly: Secondary | ICD-10-CM | POA: Diagnosis not present

## 2015-01-20 DIAGNOSIS — R509 Fever, unspecified: Secondary | ICD-10-CM | POA: Diagnosis not present

## 2015-01-20 DIAGNOSIS — Z992 Dependence on renal dialysis: Secondary | ICD-10-CM | POA: Diagnosis not present

## 2015-01-20 DIAGNOSIS — K219 Gastro-esophageal reflux disease without esophagitis: Secondary | ICD-10-CM | POA: Diagnosis not present

## 2015-01-20 DIAGNOSIS — E1140 Type 2 diabetes mellitus with diabetic neuropathy, unspecified: Secondary | ICD-10-CM | POA: Diagnosis not present

## 2015-01-20 DIAGNOSIS — N186 End stage renal disease: Secondary | ICD-10-CM | POA: Insufficient documentation

## 2015-01-20 DIAGNOSIS — N189 Chronic kidney disease, unspecified: Secondary | ICD-10-CM | POA: Diagnosis not present

## 2015-01-20 DIAGNOSIS — R9431 Abnormal electrocardiogram [ECG] [EKG]: Secondary | ICD-10-CM | POA: Insufficient documentation

## 2015-01-20 DIAGNOSIS — M869 Osteomyelitis, unspecified: Secondary | ICD-10-CM | POA: Diagnosis not present

## 2015-01-20 DIAGNOSIS — I739 Peripheral vascular disease, unspecified: Secondary | ICD-10-CM | POA: Insufficient documentation

## 2015-01-20 DIAGNOSIS — M86072 Acute hematogenous osteomyelitis, left ankle and foot: Secondary | ICD-10-CM | POA: Diagnosis not present

## 2015-01-20 DIAGNOSIS — E213 Hyperparathyroidism, unspecified: Secondary | ICD-10-CM | POA: Insufficient documentation

## 2015-01-20 HISTORY — PX: AMPUTATION: SHX166

## 2015-01-20 LAB — COMPREHENSIVE METABOLIC PANEL
ALBUMIN: 3.4 g/dL — AB (ref 3.5–5.0)
ALK PHOS: 54 U/L (ref 38–126)
ALT: <5 U/L — ABNORMAL LOW (ref 17–63)
ANION GAP: 13 (ref 5–15)
AST: 18 U/L (ref 15–41)
BUN: 28 mg/dL — AB (ref 6–20)
CO2: 30 mmol/L (ref 22–32)
Calcium: 8.8 mg/dL — ABNORMAL LOW (ref 8.9–10.3)
Chloride: 92 mmol/L — ABNORMAL LOW (ref 101–111)
Creatinine, Ser: 7.48 mg/dL — ABNORMAL HIGH (ref 0.61–1.24)
GFR calc Af Amer: 9 mL/min — ABNORMAL LOW (ref >60)
GFR calc non Af Amer: 7 mL/min — ABNORMAL LOW (ref >60)
Glucose, Bld: 173 mg/dL — ABNORMAL HIGH (ref 65–99)
POTASSIUM: 4.1 mmol/L (ref 3.5–5.1)
SODIUM: 135 mmol/L (ref 135–145)
TOTAL PROTEIN: 8.2 g/dL — AB (ref 6.5–8.1)
Total Bilirubin: 0.6 mg/dL (ref 0.3–1.2)

## 2015-01-20 LAB — CBC
HEMATOCRIT: 36 % — AB (ref 39.0–52.0)
HEMOGLOBIN: 11.4 g/dL — AB (ref 13.0–17.0)
MCH: 29.7 pg (ref 26.0–34.0)
MCHC: 31.7 g/dL (ref 30.0–36.0)
MCV: 93.8 fL (ref 78.0–100.0)
Platelets: 108 10*3/uL — ABNORMAL LOW (ref 150–400)
RBC: 3.84 MIL/uL — AB (ref 4.22–5.81)
RDW: 15.5 % (ref 11.5–15.5)
WBC: 8.5 10*3/uL (ref 4.0–10.5)

## 2015-01-20 LAB — GLUCOSE, CAPILLARY
Glucose-Capillary: 149 mg/dL — ABNORMAL HIGH (ref 65–99)
Glucose-Capillary: 137 mg/dL — ABNORMAL HIGH (ref 65–99)

## 2015-01-20 LAB — APTT: aPTT: 41 seconds — ABNORMAL HIGH (ref 24–37)

## 2015-01-20 LAB — PROTIME-INR
INR: 1.15 (ref 0.00–1.49)
Prothrombin Time: 14.9 seconds (ref 11.6–15.2)

## 2015-01-20 SURGERY — AMPUTATION DIGIT
Anesthesia: Regional | Site: Toe | Laterality: Left

## 2015-01-20 MED ORDER — ONDANSETRON HCL 4 MG/2ML IJ SOLN
4.0000 mg | Freq: Once | INTRAMUSCULAR | Status: DC | PRN
Start: 2015-01-20 — End: 2015-01-20

## 2015-01-20 MED ORDER — CEFAZOLIN SODIUM-DEXTROSE 2-3 GM-% IV SOLR
2.0000 g | INTRAVENOUS | Status: AC
  Administered 2015-01-20: 2 g via INTRAVENOUS
  Filled 2015-01-20: qty 50

## 2015-01-20 MED ORDER — HYDROMORPHONE HCL 1 MG/ML IJ SOLN: 0.5000 mg | INTRAMUSCULAR | Status: DC | PRN

## 2015-01-20 MED ORDER — MIDAZOLAM HCL 2 MG/2ML IJ SOLN
INTRAMUSCULAR | Status: AC
  Filled 2015-01-20: qty 2

## 2015-01-20 MED ORDER — FENTANYL CITRATE (PF) 250 MCG/5ML IJ SOLN
INTRAMUSCULAR | Status: AC
  Filled 2015-01-20: qty 5

## 2015-01-20 MED ORDER — PROPOFOL 10 MG/ML IV BOLUS
INTRAVENOUS | Status: AC
  Filled 2015-01-20: qty 20

## 2015-01-20 MED ORDER — FENTANYL CITRATE (PF) 100 MCG/2ML IJ SOLN
INTRAMUSCULAR | Status: AC
  Filled 2015-01-20: qty 2

## 2015-01-20 MED ORDER — 0.9 % SODIUM CHLORIDE (POUR BTL) OPTIME
TOPICAL | Status: DC | PRN
  Administered 2015-01-20: 1000 mL

## 2015-01-20 MED ORDER — FENTANYL CITRATE (PF) 250 MCG/5ML IJ SOLN
INTRAMUSCULAR | Status: DC | PRN
  Administered 2015-01-20 (×2): 50 ug via INTRAVENOUS

## 2015-01-20 MED ORDER — SODIUM CHLORIDE 0.9 % IV SOLN
INTRAVENOUS | Status: DC
  Administered 2015-01-20: 17:00:00 via INTRAVENOUS

## 2015-01-20 SURGICAL SUPPLY — 33 items
BLADE SURG 21 STRL SS (BLADE) ×3 IMPLANT
BNDG CMPR 9X4 STRL LF SNTH (GAUZE/BANDAGES/DRESSINGS)
BNDG COHESIVE 4X5 TAN STRL (GAUZE/BANDAGES/DRESSINGS) ×3 IMPLANT
BNDG COHESIVE 6X5 TAN STRL LF (GAUZE/BANDAGES/DRESSINGS) ×2 IMPLANT
BNDG ESMARK 4X9 LF (GAUZE/BANDAGES/DRESSINGS) IMPLANT
BNDG GAUZE ELAST 4 BULKY (GAUZE/BANDAGES/DRESSINGS) ×3 IMPLANT
CANISTER SUCTION 2500CC (MISCELLANEOUS) ×2 IMPLANT
COVER SURGICAL LIGHT HANDLE (MISCELLANEOUS) ×6 IMPLANT
DRAPE U-SHAPE 47X51 STRL (DRAPES) ×3 IMPLANT
DRSG ADAPTIC 3X8 NADH LF (GAUZE/BANDAGES/DRESSINGS) IMPLANT
DRSG PAD ABDOMINAL 8X10 ST (GAUZE/BANDAGES/DRESSINGS) ×3 IMPLANT
DURAPREP 26ML APPLICATOR (WOUND CARE) ×3 IMPLANT
ELECT REM PT RETURN 9FT ADLT (ELECTROSURGICAL) ×3
ELECTRODE REM PT RTRN 9FT ADLT (ELECTROSURGICAL) ×1 IMPLANT
GAUZE SPONGE 4X4 12PLY STRL (GAUZE/BANDAGES/DRESSINGS) ×2 IMPLANT
GAUZE XEROFORM 1X8 LF (GAUZE/BANDAGES/DRESSINGS) ×2 IMPLANT
GLOVE BIOGEL PI IND STRL 9 (GLOVE) ×1 IMPLANT
GLOVE BIOGEL PI INDICATOR 9 (GLOVE) ×2
GLOVE SURG ORTHO 9.0 STRL STRW (GLOVE) ×3 IMPLANT
GOWN STRL REUS W/ TWL XL LVL3 (GOWN DISPOSABLE) ×2 IMPLANT
GOWN STRL REUS W/TWL XL LVL3 (GOWN DISPOSABLE) ×6
KIT BASIN OR (CUSTOM PROCEDURE TRAY) ×3 IMPLANT
KIT ROOM TURNOVER OR (KITS) ×3 IMPLANT
MANIFOLD NEPTUNE II (INSTRUMENTS) ×1 IMPLANT
NEEDLE 22X1 1/2 (OR ONLY) (NEEDLE) IMPLANT
NS IRRIG 1000ML POUR BTL (IV SOLUTION) ×3 IMPLANT
PACK ORTHO EXTREMITY (CUSTOM PROCEDURE TRAY) ×3 IMPLANT
PAD ARMBOARD 7.5X6 YLW CONV (MISCELLANEOUS) ×6 IMPLANT
SUCTION FRAZIER TIP 10 FR DISP (SUCTIONS) IMPLANT
SUT ETHILON 2 0 PSLX (SUTURE) ×3 IMPLANT
SYR CONTROL 10ML LL (SYRINGE) IMPLANT
TOWEL OR 17X24 6PK STRL BLUE (TOWEL DISPOSABLE) ×3 IMPLANT
TOWEL OR 17X26 10 PK STRL BLUE (TOWEL DISPOSABLE) ×3 IMPLANT

## 2015-01-20 NOTE — H&P (Signed)
[name] is an 49 y.o. male.   Chief Complaint: Osteomyelitis ulceration left great toe HPI: Patient is a 50 year old gentleman diabetic insensate neuropathy who has failed conservative wound care for her left great toe ulceration and infection  Past Medical History  Diagnosis Date  . Diabetes mellitus   . Hypertension   . Chest pain   . Morbid obesity   . Bronchitis   . Blood transfusion   . H/O hiatal hernia   . Chronic kidney disease     d[facility], [facility]  . GERD (gastroesophageal reflux disease)   . Dysrhythmia     irregular heartbeat  . Hyperparathyroidism   . ESRD (end stage renal disease) on dialysis     first HD 06/19/04    Past Surgical History  Procedure Laterality Date  . Av fistula placement  10/11/2010  . Ankle fusion      rt foot charcot joint 2008 with refusion  . Av fistula placement  07/02/2011    Procedure: INSERTION OF ARTERIOVENOUS (AV) GORE-TEX GRAFT ARM;  Surgeon: Elam Dutch, MD;  Location: [place];  Service: Vascular;  Laterality: Left;  . Thrombectomy    . Insertion of dialysis catheter  01/20/2012    Procedure: INSERTION OF DIALYSIS CATHETER;  Surgeon: Serafina Mitchell, MD;  Location: [place];  Service: Vascular;  Laterality: N/A;  insertion left internal jugular dialysis catheter 23cm  . Umbilcial hernia    . Parathyroidectomy    . Avgg removal Right 11/06/2012    Procedure: REMOVAL OF ARTERIOVENOUS GORETEX GRAFT ([place]);  Surgeon: Mal Misty, MD;  Location: [place];  Service: Vascular;  Laterality: Right;  . Av fistula placement Right 11/06/2012    Procedure: INSERTION OF ARTERIOVENOUS (AV) GORE-TEX GRAFT ARM;  Surgeon: Mal Misty, MD;  Location: [place];  Service: Vascular;  Laterality: Right;  . Insertion of dialysis catheter Right 11/09/2012    Procedure: INSERTION OF DIALYSIS CATHETER;  Surgeon: Conrad [place], MD;  Location: [place];  Service: Vascular;  Laterality: Right;  Right  Femoral Vein  . Venogram N/A 01/06/2014    Procedure:  VENOGRAM;  Surgeon: Conrad [place], MD;  Location: [facility] CATH LAB;  Service: Cardiovascular;  Laterality: N/A;    Family History  Problem Relation Age of Onset  . Heart disease Mother   . Kidney disease Mother   . Hypertension Mother    Social History:  reports that he has been smoking Cigarettes.  He has a 2 pack-year smoking history. He has never used smokeless tobacco. He reports that he does not drink alcohol or use illicit drugs.  Allergies: No Known Allergies  No prescriptions prior to admission    No results found for this or any previous visit (from the past 48 hour(s)). No results found.  Review of Systems  All other systems reviewed and are negative.   There were no vitals taken for this visit. Physical Exam  On examination patient has ulceration cellulitis ostium myelitis left great toe Assessment/Plan Assessment: Osteomyelitis left great toe.  Plan: We will plan for amputation of left great toe.  [name] V 01/20/2015, 7:07 AM

## 2015-01-20 NOTE — Anesthesia Preprocedure Evaluation (Signed)
Anesthesia Evaluation  Patient identified by MRN, date of birth, ID band Patient awake    Reviewed: Allergy & Precautions, NPO status , Patient's Chart, lab work & pertinent test results  Airway Mallampati: II  TM Distance: >3 FB     Dental   Pulmonary Current Smoker,    + decreased breath sounds      Cardiovascular hypertension, + Peripheral Vascular Disease + dysrhythmias Rhythm:Regular Rate:Normal     Neuro/Psych    GI/Hepatic hiatal hernia, GERD-  ,  Endo/Other  diabetes, Type 2, Oral Hypoglycemic AgentsMorbid obesity  Renal/GU ESRF and DialysisRenal disease     Musculoskeletal   Abdominal   Peds  Hematology   Anesthesia Other Findings   Reproductive/Obstetrics                             Anesthesia Physical Anesthesia Plan  ASA: IV  Anesthesia Plan: Regional   Post-op Pain Management:    Induction: Intravenous  Airway Management Planned: Simple Face Mask  Additional Equipment:   Intra-op Plan:   Post-operative Plan:   Informed Consent: I have reviewed the patients History and Physical, chart, labs and discussed the procedure including the risks, benefits and alternatives for the proposed anesthesia with the patient or authorized representative who has indicated his/her understanding and acceptance.     Plan Discussed with: CRNA, Anesthesiologist and Surgeon  Anesthesia Plan Comments:         Anesthesia Quick Evaluation

## 2015-01-20 NOTE — Progress Notes (Signed)
Orthopedic Tech Progress Note Patient Details:  [name] 06-17-1966 [id number] Applied post-op shoe to LLE.  Fit pt. for crutches. Ortho Devices Type of Ortho Device: Postop shoe/boot, Crutches Ortho Device/Splint Location: LLE Ortho Device/Splint Interventions: Application   Darrol Poke 01/20/2015, 6:19 PM

## 2015-01-20 NOTE — Anesthesia Postprocedure Evaluation (Signed)
  Anesthesia Post-op Note  Patient: [name]  Procedure(s) Performed: Procedure(s): Left Great Toe Amputation (Left)  Patient Location: PACU  Anesthesia Type: Regional   Level of Consciousness: awake, alert  and oriented  Airway and Oxygen Therapy: Patient Spontanous Breathing  Post-op Pain: mild  Post-op Assessment: Post-op Vital signs reviewed  Post-op Vital Signs: Reviewed  Last Vitals:  Filed Vitals:   01/20/15 1810  BP: 128/56  Pulse: 93  Temp:   Resp: 18    Complications: No apparent anesthesia complications

## 2015-01-20 NOTE — Progress Notes (Signed)
5 unsuccessful iv attempts.

## 2015-01-20 NOTE — Op Note (Signed)
01/20/2015  5:03 PM  PATIENT:  [name]    PRE-OPERATIVE DIAGNOSIS:  Left Great toe amputation  POST-OPERATIVE DIAGNOSIS:  Same  PROCEDURE:  Left Foot [place] Amputation  SURGEON:  Newt Minion, MD  PHYSICIAN ASSISTANT:None ANESTHESIA:   General  PREOPERATIVE INDICATIONS:  [name] is a  49 y.o. male with a diagnosis of Left Great toe amputation who failed conservative measures and elected for surgical management.    The risks benefits and alternatives were discussed with the patient preoperatively including but not limited to the risks of infection, bleeding, nerve injury, cardiopulmonary complications, the need for revision surgery, among others, and the patient was willing to proceed.  OPERATIVE IMPLANTS: none  OPERATIVE FINDINGS: Good petechial bleeding  OPERATIVE PROCEDURE: Patient was brought to the operating room after undergoing a regional block. After adequate levels of anesthesia were obtained patient's left lower extremity was prepped using DuraPrep and draped into a sterile field. A timeout was called. A elliptical incision was made around the great toe down to the mid metatarsal. The metatarsal was resected through its shaft and the metatarsal sesamoids and great toe were resected in 1 block of tissue. The wound was irrigated with normal saline. Electrocautery was used for hemostasis. The incision was closed using 2-0 nylon. A sterile compressive dressing was applied. Patient was taken to the PACU in stable condition.

## 2015-01-20 NOTE — Anesthesia Procedure Notes (Addendum)
Anesthesia Regional Block:  Ankle block  Pre-Anesthetic Checklist: ,, timeout performed, Correct Patient, Correct Site, Correct Laterality, Correct Procedure, Correct Position, site marked, Risks and benefits discussed,  Surgical consent,  Pre-op evaluation,  At surgeon's request and post-op pain management  Laterality: Right  Prep: Maximum Sterile Barrier Precautions used, chloraprep and alcohol swabs       Needles:  Injection technique: Single-shot     Needle Length: 5cm 5 cm Needle Gauge: 25 and 25 G    Additional Needles: Ankle block Narrative:  Start time: 01/20/2015 4:05 PM End time: 01/20/2015 4:10 PM Injection made incrementally with aspirations every 5 mL.  Performed by: Personally  Anesthesiologist: Kate Sable  Additional Notes: Pt accepts procedure w/ risks. 30cc ( 15cc 2% Lidocaine and 15cc 0.5% Marcaine w/o epi ) Anterior and Posterior Tibial w/o difficulty. GES   Anesthesia Regional Block:   Narrative:

## 2015-01-20 NOTE — Transfer of Care (Signed)
Immediate Anesthesia Transfer of Care Note  Patient: [name]  Procedure(s) Performed: Procedure(s): Left Great Toe Amputation (Left)  Patient Location: PACU  Anesthesia Type:MAC combined with regional for post-op pain  Level of Consciousness: awake  Airway & Oxygen Therapy: Patient Spontanous Breathing  Post-op Assessment: Report given to RN and Post -op Vital signs reviewed and stable  Post vital signs: stable  Last Vitals:  Filed Vitals:   01/20/15 1448  BP: 112/53  Pulse: 85  Temp: 36.7 C  Resp: 20    Complications: No apparent anesthesia complications

## 2015-01-23 ENCOUNTER — Encounter (HOSPITAL_COMMUNITY): Payer: Self-pay | Admitting: Orthopedic Surgery

## 2015-02-08 DIAGNOSIS — E1129 Type 2 diabetes mellitus with other diabetic kidney complication: Secondary | ICD-10-CM | POA: Diagnosis not present

## 2015-02-12 DIAGNOSIS — E1129 Type 2 diabetes mellitus with other diabetic kidney complication: Secondary | ICD-10-CM | POA: Diagnosis not present

## 2015-02-12 DIAGNOSIS — N186 End stage renal disease: Secondary | ICD-10-CM | POA: Diagnosis not present

## 2015-02-12 DIAGNOSIS — Z992 Dependence on renal dialysis: Secondary | ICD-10-CM | POA: Diagnosis not present

## 2015-02-13 DIAGNOSIS — D631 Anemia in chronic kidney disease: Secondary | ICD-10-CM | POA: Diagnosis not present

## 2015-02-13 DIAGNOSIS — E8770 Fluid overload, unspecified: Secondary | ICD-10-CM | POA: Diagnosis not present

## 2015-02-13 DIAGNOSIS — D508 Other iron deficiency anemias: Secondary | ICD-10-CM | POA: Diagnosis not present

## 2015-02-13 DIAGNOSIS — E119 Type 2 diabetes mellitus without complications: Secondary | ICD-10-CM | POA: Diagnosis not present

## 2015-02-13 DIAGNOSIS — N2581 Secondary hyperparathyroidism of renal origin: Secondary | ICD-10-CM | POA: Diagnosis not present

## 2015-02-13 DIAGNOSIS — N186 End stage renal disease: Secondary | ICD-10-CM | POA: Diagnosis not present

## 2015-02-13 DIAGNOSIS — D509 Iron deficiency anemia, unspecified: Secondary | ICD-10-CM | POA: Diagnosis not present

## 2015-02-27 ENCOUNTER — Other Ambulatory Visit (HOSPITAL_COMMUNITY): Payer: Self-pay | Admitting: Orthopedic Surgery

## 2015-03-06 DIAGNOSIS — I871 Compression of vein: Secondary | ICD-10-CM | POA: Diagnosis not present

## 2015-03-06 DIAGNOSIS — Z992 Dependence on renal dialysis: Secondary | ICD-10-CM | POA: Diagnosis not present

## 2015-03-06 DIAGNOSIS — N186 End stage renal disease: Secondary | ICD-10-CM | POA: Diagnosis not present

## 2015-03-06 DIAGNOSIS — T82858D Stenosis of vascular prosthetic devices, implants and grafts, subsequent encounter: Secondary | ICD-10-CM | POA: Diagnosis not present

## 2015-03-06 MED ORDER — MUPIROCIN 2 % EX OINT
1.0000 "application " | TOPICAL_OINTMENT | CUTANEOUS | Status: AC
  Administered 2015-03-07: 1 via TOPICAL

## 2015-03-06 MED ORDER — CHLORHEXIDINE GLUCONATE 4 % EX LIQD: 60.0000 mL | Freq: Once | CUTANEOUS | Status: DC

## 2015-03-06 MED ORDER — CEFAZOLIN SODIUM-DEXTROSE 2-3 GM-% IV SOLR
2.0000 g | INTRAVENOUS | Status: AC
  Administered 2015-03-07: 2 g via INTRAVENOUS

## 2015-03-06 NOTE — Progress Notes (Signed)
I was unable to reach patient by phone.  I left  A message on voice mail.  I instructed the patient to arrive at [facility] entrance at 8:30 AM  , nothing to eat or drink after midnight.   I instructed the patient to take the following medications in the am, if needed, if he can tolerate on an empty stomach.with just enough water to get them down: pain medication  I asked patient to not wear any lotions, powders, cologne, jewelry, piercing, make-up or nail polish.  I asked the patient to call [phone], in the am if there were any questions or problems.

## 2015-03-07 ENCOUNTER — Encounter (HOSPITAL_COMMUNITY): Payer: Self-pay | Admitting: *Deleted

## 2015-03-07 ENCOUNTER — Ambulatory Visit (HOSPITAL_COMMUNITY)
Admission: RE | Admit: 2015-03-07 | Discharge: 2015-03-07 | Disposition: A | Discharge status: Home / Self Care | Payer: Medicare Other | Source: Ambulatory Visit | Attending: Orthopedic Surgery | Admitting: Orthopedic Surgery

## 2015-03-07 ENCOUNTER — Encounter (HOSPITAL_COMMUNITY)
Admission: RE | Disposition: A | Discharge status: Home / Self Care | Payer: Self-pay | Source: Ambulatory Visit | Attending: Orthopedic Surgery

## 2015-03-07 ENCOUNTER — Ambulatory Visit (HOSPITAL_COMMUNITY): Payer: Medicare Other | Admitting: Anesthesiology

## 2015-03-07 DIAGNOSIS — Z992 Dependence on renal dialysis: Secondary | ICD-10-CM | POA: Insufficient documentation

## 2015-03-07 DIAGNOSIS — M86272 Subacute osteomyelitis, left ankle and foot: Secondary | ICD-10-CM | POA: Diagnosis not present

## 2015-03-07 DIAGNOSIS — E1140 Type 2 diabetes mellitus with diabetic neuropathy, unspecified: Secondary | ICD-10-CM | POA: Diagnosis not present

## 2015-03-07 DIAGNOSIS — E213 Hyperparathyroidism, unspecified: Secondary | ICD-10-CM | POA: Insufficient documentation

## 2015-03-07 DIAGNOSIS — T8781 Dehiscence of amputation stump: Secondary | ICD-10-CM | POA: Insufficient documentation

## 2015-03-07 DIAGNOSIS — L988 Other specified disorders of the skin and subcutaneous tissue: Secondary | ICD-10-CM | POA: Insufficient documentation

## 2015-03-07 DIAGNOSIS — K219 Gastro-esophageal reflux disease without esophagitis: Secondary | ICD-10-CM | POA: Diagnosis not present

## 2015-03-07 DIAGNOSIS — Y835 Amputation of limb(s) as the cause of abnormal reaction of the patient, or of later complication, without mention of misadventure at the time of the procedure: Secondary | ICD-10-CM | POA: Insufficient documentation

## 2015-03-07 DIAGNOSIS — E119 Type 2 diabetes mellitus without complications: Secondary | ICD-10-CM | POA: Diagnosis not present

## 2015-03-07 DIAGNOSIS — F17210 Nicotine dependence, cigarettes, uncomplicated: Secondary | ICD-10-CM | POA: Insufficient documentation

## 2015-03-07 DIAGNOSIS — M86172 Other acute osteomyelitis, left ankle and foot: Secondary | ICD-10-CM

## 2015-03-07 DIAGNOSIS — E1122 Type 2 diabetes mellitus with diabetic chronic kidney disease: Secondary | ICD-10-CM | POA: Insufficient documentation

## 2015-03-07 DIAGNOSIS — T8131XA Disruption of external operation (surgical) wound, not elsewhere classified, initial encounter: Secondary | ICD-10-CM | POA: Diagnosis not present

## 2015-03-07 DIAGNOSIS — I499 Cardiac arrhythmia, unspecified: Secondary | ICD-10-CM | POA: Diagnosis not present

## 2015-03-07 DIAGNOSIS — M868X7 Other osteomyelitis, ankle and foot: Secondary | ICD-10-CM | POA: Insufficient documentation

## 2015-03-07 DIAGNOSIS — I120 Hypertensive chronic kidney disease with stage 5 chronic kidney disease or end stage renal disease: Secondary | ICD-10-CM | POA: Insufficient documentation

## 2015-03-07 DIAGNOSIS — Z79899 Other long term (current) drug therapy: Secondary | ICD-10-CM | POA: Insufficient documentation

## 2015-03-07 DIAGNOSIS — N186 End stage renal disease: Secondary | ICD-10-CM | POA: Insufficient documentation

## 2015-03-07 DIAGNOSIS — L02612 Cutaneous abscess of left foot: Secondary | ICD-10-CM | POA: Diagnosis not present

## 2015-03-07 DIAGNOSIS — E1142 Type 2 diabetes mellitus with diabetic polyneuropathy: Secondary | ICD-10-CM | POA: Diagnosis not present

## 2015-03-07 HISTORY — PX: AMPUTATION: SHX166

## 2015-03-07 LAB — COMPREHENSIVE METABOLIC PANEL
ALK PHOS: 58 U/L (ref 38–126)
ALT: 8 U/L — AB (ref 17–63)
AST: 14 U/L — ABNORMAL LOW (ref 15–41)
Albumin: 3.4 g/dL — ABNORMAL LOW (ref 3.5–5.0)
Anion gap: 13 (ref 5–15)
BUN: 39 mg/dL — ABNORMAL HIGH (ref 6–20)
CALCIUM: 9.3 mg/dL (ref 8.9–10.3)
CO2: 30 mmol/L (ref 22–32)
Chloride: 96 mmol/L — ABNORMAL LOW (ref 101–111)
Creatinine, Ser: 10.45 mg/dL — ABNORMAL HIGH (ref 0.61–1.24)
GFR calc Af Amer: 6 mL/min — ABNORMAL LOW (ref >60)
GFR, EST NON AFRICAN AMERICAN: 5 mL/min — AB (ref >60)
Glucose, Bld: 136 mg/dL — ABNORMAL HIGH (ref 65–99)
Potassium: 4.4 mmol/L (ref 3.5–5.1)
SODIUM: 139 mmol/L (ref 135–145)
Total Bilirubin: 0.5 mg/dL (ref 0.3–1.2)
Total Protein: 7.6 g/dL (ref 6.5–8.1)

## 2015-03-07 LAB — CBC
HEMATOCRIT: 36.1 % — AB (ref 39.0–52.0)
HEMOGLOBIN: 11.4 g/dL — AB (ref 13.0–17.0)
MCH: 29.8 pg (ref 26.0–34.0)
MCHC: 31.6 g/dL (ref 30.0–36.0)
MCV: 94.3 fL (ref 78.0–100.0)
Platelets: 89 10*3/uL — ABNORMAL LOW (ref 150–400)
RBC: 3.83 MIL/uL — AB (ref 4.22–5.81)
RDW: 15.1 % (ref 11.5–15.5)
WBC: 7.3 10*3/uL (ref 4.0–10.5)

## 2015-03-07 LAB — GLUCOSE, CAPILLARY
Glucose-Capillary: 127 mg/dL — ABNORMAL HIGH (ref 65–99)
Glucose-Capillary: 121 mg/dL — ABNORMAL HIGH (ref 65–99)

## 2015-03-07 LAB — PROTIME-INR
INR: 1.15 (ref 0.00–1.49)
Prothrombin Time: 14.9 seconds (ref 11.6–15.2)

## 2015-03-07 LAB — APTT: aPTT: 38 seconds — ABNORMAL HIGH (ref 24–37)

## 2015-03-07 LAB — SURGICAL PCR SCREEN
MRSA, PCR: NEGATIVE
Staphylococcus aureus: NEGATIVE

## 2015-03-07 SURGERY — AMPUTATION, FOOT, RAY
Anesthesia: Regional | Site: Foot | Laterality: Left

## 2015-03-07 MED ORDER — ONDANSETRON HCL 4 MG/2ML IJ SOLN
INTRAMUSCULAR | Status: DC | PRN
  Administered 2015-03-07: 4 mg via INTRAVENOUS

## 2015-03-07 MED ORDER — 0.9 % SODIUM CHLORIDE (POUR BTL) OPTIME
TOPICAL | Status: DC | PRN
  Administered 2015-03-07: 1000 mL

## 2015-03-07 MED ORDER — OXYCODONE-ACETAMINOPHEN 10-325 MG PO TABS: 1.0000 | ORAL_TABLET | ORAL | Status: DC | PRN

## 2015-03-07 MED ORDER — MIDAZOLAM HCL 5 MG/5ML IJ SOLN
INTRAMUSCULAR | Status: DC | PRN
  Administered 2015-03-07: 2 mg via INTRAVENOUS

## 2015-03-07 MED ORDER — FENTANYL CITRATE (PF) 100 MCG/2ML IJ SOLN
INTRAMUSCULAR | Status: DC | PRN
  Administered 2015-03-07 (×3): 50 ug via INTRAVENOUS
  Administered 2015-03-07: 100 ug via INTRAVENOUS

## 2015-03-07 MED ORDER — MUPIROCIN 2 % EX OINT
TOPICAL_OINTMENT | CUTANEOUS | Status: AC
  Filled 2015-03-07: qty 22

## 2015-03-07 MED ORDER — SODIUM CHLORIDE 0.9 % IV SOLN
INTRAVENOUS | Status: DC
Start: 2015-03-07 — End: 2015-03-07
  Administered 2015-03-07: 11:00:00 via INTRAVENOUS
  Administered 2015-03-07: 10 mL/h via INTRAVENOUS

## 2015-03-07 MED ORDER — CEFAZOLIN SODIUM-DEXTROSE 2-3 GM-% IV SOLR
INTRAVENOUS | Status: AC
  Filled 2015-03-07: qty 50

## 2015-03-07 MED ORDER — MIDAZOLAM HCL 2 MG/2ML IJ SOLN
INTRAMUSCULAR | Status: AC
  Filled 2015-03-07: qty 4

## 2015-03-07 MED ORDER — FENTANYL CITRATE (PF) 250 MCG/5ML IJ SOLN
INTRAMUSCULAR | Status: AC
  Filled 2015-03-07: qty 5

## 2015-03-07 SURGICAL SUPPLY — 34 items
BLADE SAW SGTL MED 73X18.5 STR (BLADE) ×2 IMPLANT
BLADE SURG 21 STRL SS (BLADE) ×2 IMPLANT
BNDG COHESIVE 3X5 TAN STRL LF (GAUZE/BANDAGES/DRESSINGS) ×2 IMPLANT
BNDG COHESIVE 4X5 TAN STRL (GAUZE/BANDAGES/DRESSINGS) ×3 IMPLANT
BNDG GAUZE ELAST 4 BULKY (GAUZE/BANDAGES/DRESSINGS) ×3 IMPLANT
COVER MAYO STAND STRL (DRAPES) ×2 IMPLANT
COVER SURGICAL LIGHT HANDLE (MISCELLANEOUS) ×6 IMPLANT
DRAPE U-SHAPE 47X51 STRL (DRAPES) ×6 IMPLANT
DRSG ADAPTIC 3X8 NADH LF (GAUZE/BANDAGES/DRESSINGS) ×3 IMPLANT
DRSG PAD ABDOMINAL 8X10 ST (GAUZE/BANDAGES/DRESSINGS) ×4 IMPLANT
DURAPREP 26ML APPLICATOR (WOUND CARE) ×3 IMPLANT
ELECT REM PT RETURN 9FT ADLT (ELECTROSURGICAL) ×3
ELECTRODE REM PT RTRN 9FT ADLT (ELECTROSURGICAL) ×1 IMPLANT
GAUZE SPONGE 4X4 12PLY STRL (GAUZE/BANDAGES/DRESSINGS) ×3 IMPLANT
GLOVE BIOGEL PI IND STRL 9 (GLOVE) ×1 IMPLANT
GLOVE BIOGEL PI INDICATOR 9 (GLOVE) ×2
GLOVE SURG ORTHO 9.0 STRL STRW (GLOVE) ×3 IMPLANT
GOWN STRL REUS W/ TWL LRG LVL3 (GOWN DISPOSABLE) ×1 IMPLANT
GOWN STRL REUS W/ TWL XL LVL3 (GOWN DISPOSABLE) ×2 IMPLANT
GOWN STRL REUS W/TWL LRG LVL3 (GOWN DISPOSABLE) ×3
GOWN STRL REUS W/TWL XL LVL3 (GOWN DISPOSABLE) ×6
KIT BASIN OR (CUSTOM PROCEDURE TRAY) ×3 IMPLANT
KIT ROOM TURNOVER OR (KITS) ×3 IMPLANT
NS IRRIG 1000ML POUR BTL (IV SOLUTION) ×3 IMPLANT
PACK ORTHO EXTREMITY (CUSTOM PROCEDURE TRAY) ×3 IMPLANT
PAD ARMBOARD 7.5X6 YLW CONV (MISCELLANEOUS) ×6 IMPLANT
SPONGE GAUZE 4X4 12PLY STER LF (GAUZE/BANDAGES/DRESSINGS) ×2 IMPLANT
SPONGE LAP 18X18 X RAY DECT (DISPOSABLE) ×5 IMPLANT
STOCKINETTE IMPERVIOUS LG (DRAPES) ×2 IMPLANT
SUT ETHILON 2 0 PSLX (SUTURE) ×6 IMPLANT
TOWEL OR 17X24 6PK STRL BLUE (TOWEL DISPOSABLE) ×3 IMPLANT
TOWEL OR 17X26 10 PK STRL BLUE (TOWEL DISPOSABLE) ×3 IMPLANT
UNDERPAD 30X30 INCONTINENT (UNDERPADS AND DIAPERS) ×3 IMPLANT
WATER STERILE IRR 1000ML POUR (IV SOLUTION) ×3 IMPLANT

## 2015-03-07 NOTE — Anesthesia Procedure Notes (Addendum)
Procedure Name: MAC Date/Time: 03/07/2015 11:17 AM Performed by: Jacquiline Doe A Pre-anesthesia Checklist: Patient identified, Timeout performed, Emergency Drugs available, Suction available and Patient being monitored Patient Re-evaluated:Patient Re-evaluated prior to inductionOxygen Delivery Method: Nasal cannula Intubation Type: IV induction Placement Confirmation: positive ETCO2 Dental Injury: Teeth and Oropharynx as per pre-operative assessment    Anesthesia Regional Block:  Ankle block  Pre-Anesthetic Checklist: ,, timeout performed, Correct Patient, Correct Site, Correct Laterality, Correct Procedure, Correct Position, site marked, Risks and benefits discussed,  Surgical consent,  Pre-op evaluation,  At surgeon's request and post-op pain management  Laterality: Left  Prep: chloraprep       Needles:  Injection technique: Single-shot     Needle Length: 4cm 4 cm Needle Gauge: 22 and 22 G    Additional Needles: Ankle block Narrative:  Start time: 03/07/2015 10:40 AM End time: 03/07/2015 10:45 AM Injection made incrementally with aspirations every 3 mL.  Performed by: Personally   Additional Notes: 35 cc 2% plain lidocaine injected easily

## 2015-03-07 NOTE — Op Note (Signed)
03/07/2015  11:51 AM  PATIENT:  [name]    PRE-OPERATIVE DIAGNOSIS:  Dehiscence Left Foot Great Toe Amputation  POST-OPERATIVE DIAGNOSIS:  Same  PROCEDURE:  Left Foot 1st and 2nd Ray Amputation Local tissue rearrangement for wound closure 5 x 10 cm x 3 cm deep  SURGEON:  DUDA,MARCUS V, MD  PHYSICIAN ASSISTANT:None ANESTHESIA:   General  PREOPERATIVE INDICATIONS:  [name] is a  49 y.o. male with a diagnosis of Dehiscence Left Foot Great Toe Amputation who failed conservative measures and elected for surgical management.    The risks benefits and alternatives were discussed with the patient preoperatively including but not limited to the risks of infection, bleeding, nerve injury, cardiopulmonary complications, the need for revision surgery, among others, and the patient was willing to proceed.  OPERATIVE IMPLANTS: None  OPERATIVE FINDINGS: Good petechial bleeding no abscess  OPERATIVE PROCEDURE: Patient was brought to the operating room after undergoing an ankle block. After adequate levels of anesthesia were obtained patient's left lower extremity was prepped using DuraPrep draped into a sterile field. A timeout was called. Elliptical incision was made around the ulcer and the infected bone this included the first ray and second ray. The second ray was resected obliquely through the mid shaft. The first ray was resected through the base of the first metatarsal. The ulcer and infected bone were resected in 1 block of tissue. The wound was irrigated with normal saline hemostasis was obtained. Local tissue rearrangement was used to close a wound which was 5 x 10 cm and 3 cm deep. The wound was closed using 2-0 nylon there was no tension on the skin. A compression dressing was applied patient was then taken to the PACU in stable condition plan for discharge to home.

## 2015-03-07 NOTE — H&P (Signed)
[name] is an 49 y.o. male.   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 Diabetes mellitus   . Hypertension   . Chest pain   . Morbid obesity   . Bronchitis   . Blood transfusion   . H/O hiatal hernia   . Chronic kidney disease     d[facility], [facility]  . GERD (gastroesophageal reflux disease)   . Dysrhythmia     irregular heartbeat  . Hyperparathyroidism   . ESRD (end stage renal disease) on dialysis     first HD 06/19/04    Past Surgical History  Procedure Laterality Date  . Av fistula placement  10/11/2010  . Ankle fusion      rt foot charcot joint 2008 with refusion  . Av fistula placement  07/02/2011    Procedure: INSERTION OF ARTERIOVENOUS (AV) GORE-TEX GRAFT ARM;  Surgeon: Elam Dutch, MD;  Location: [place];  Service: Vascular;  Laterality: Left;  . Thrombectomy    . Insertion of dialysis catheter  01/20/2012    Procedure: INSERTION OF DIALYSIS CATHETER;  Surgeon: Serafina Mitchell, MD;  Location: [place];  Service: Vascular;  Laterality: N/A;  insertion left internal jugular dialysis catheter 23cm  . Umbilcial hernia    . Parathyroidectomy    . Avgg removal Right 11/06/2012    Procedure: REMOVAL OF ARTERIOVENOUS GORETEX GRAFT ([place]);  Surgeon: Mal Misty, MD;  Location: [place];  Service: Vascular;  Laterality: Right;  . Av fistula placement Right 11/06/2012    Procedure: INSERTION OF ARTERIOVENOUS (AV) GORE-TEX GRAFT ARM;  Surgeon: Mal Misty, MD;  Location: [place];  Service: Vascular;  Laterality: Right;  . Insertion of dialysis catheter Right 11/09/2012    Procedure: INSERTION OF DIALYSIS CATHETER;  Surgeon: Conrad S.N.P.J., MD;  Location: [place];  Service: Vascular;  Laterality: Right;  Right  Femoral  Vein  . Venogram N/A 01/06/2014    Procedure: VENOGRAM;  Surgeon: Conrad , MD;  Location: [facility] CATH LAB;  Service: Cardiovascular;  Laterality: N/A;  . Amputation Left 01/20/2015    Procedure: Left Great Toe Amputation;  Surgeon: Newt Minion, MD;  Location: [place];  Service: Orthopedics;  Laterality: Left;    Family History  Problem Relation Age of Onset  . Heart disease Mother   . Kidney disease Mother   . Hypertension Mother    Social History:  reports that he has been smoking Cigarettes.  He has a 2 pack-year smoking history. He has never used smokeless tobacco. He reports that he does not drink alcohol or use illicit drugs.  Allergies: No Known Allergies  No prescriptions prior to admission    No results found for this or any previous visit (from the past 48 hour(s)). No results found.  Review of Systems  All other systems reviewed and are negative.   There were no vitals taken for this visit. Physical Exam  Osteomyelitis ulceration first metatarsal left foot Assessment/Plan Assessment: Osteomyelitis ulceration first metatarsal head left foot status post great toe amputation.  Plan: We'll plan for first ray and second toe amputation. Risks and benefits were discussed patient states he understands wishes to proceed at this time.  Denver Bentson V 03/07/2015, 6:17 AM

## 2015-03-07 NOTE — Anesthesia Postprocedure Evaluation (Signed)
  Anesthesia Post-op Note  Patient: [name]  Procedure(s) Performed: Procedure(s): Left Foot 1st and 2nd Ray Amputation (Left)  Patient Location: PACU  Anesthesia Type:MAC and Regional  Level of Consciousness: awake, alert  and oriented  Airway and Oxygen Therapy: Patient Spontanous Breathing  Post-op Pain: none  Post-op Assessment: Post-op Vital signs reviewed, Patient's Cardiovascular Status Stable, Respiratory Function Stable, Patent Airway and Pain level controlled              Post-op Vital Signs: stable  Last Vitals:  Filed Vitals:   03/07/15 1245  BP: 123/66  Pulse: 83  Temp:   Resp: 18    Complications: No apparent anesthesia complications

## 2015-03-07 NOTE — Transfer of Care (Signed)
Immediate Anesthesia Transfer of Care Note  Patient: [name]  Procedure(s) Performed: Procedure(s): Left Foot 1st and 2nd Ray Amputation (Left)  Patient Location: PACU  Anesthesia Type:MAC combined with regional for post-op pain  Level of Consciousness: awake, oriented, sedated, patient cooperative and responds to stimulation  Airway & Oxygen Therapy: Patient Spontanous Breathing  Post-op Assessment: Report given to RN  Post vital signs: Reviewed and stable  Last Vitals:  Filed Vitals:   03/07/15 1015  BP: 141/68  Pulse: 91  Temp: 36.8 C  Resp: 20    Complications: No apparent anesthesia complications

## 2015-03-07 NOTE — Progress Notes (Signed)
Orthopedic Tech Progress Note Patient Details:  [name] 1966/03/19 [id number]  Ortho Devices Type of Ortho Device: CAM walker Ortho Device/Splint Interventions: Application   [name] 03/07/2015, 12:43 PM

## 2015-03-07 NOTE — Progress Notes (Signed)
Patient left old boot, put it in the trash can . New boot on and secure

## 2015-03-07 NOTE — Anesthesia Preprocedure Evaluation (Signed)
Anesthesia Evaluation  Patient identified by MRN, date of birth, ID band Patient awake    Reviewed: Allergy & Precautions, NPO status , Patient's Chart, lab work & pertinent test results  Airway Mallampati: II  TM Distance: >3 FB Neck ROM: Full    Dental  (+) Teeth Intact   Pulmonary Current Smoker,  breath sounds clear to auscultation        Cardiovascular hypertension, Rhythm:Regular Rate:Normal     Neuro/Psych    GI/Hepatic   Endo/Other  diabetes  Renal/GU      Musculoskeletal   Abdominal   Peds  Hematology   Anesthesia Other Findings   Reproductive/Obstetrics                             Anesthesia Physical Anesthesia Plan  ASA: III  Anesthesia Plan: MAC   Post-op Pain Management: MAC Combined w/ Regional for Post-op pain   Induction:   Airway Management Planned: Natural Airway and Simple Face Mask  Additional Equipment:   Intra-op Plan:   Post-operative Plan:   Informed Consent: I have reviewed the patients History and Physical, chart, labs and discussed the procedure including the risks, benefits and alternatives for the proposed anesthesia with the patient or authorized representative who has indicated his/her understanding and acceptance.     Plan Discussed with: CRNA and Anesthesiologist  Anesthesia Plan Comments:         Anesthesia Quick Evaluation

## 2015-03-07 NOTE — Progress Notes (Signed)
   03/07/15 0959  OBSTRUCTIVE SLEEP APNEA  Have you ever been diagnosed with sleep apnea through a sleep study? No  Do you snore loudly (loud enough to be heard through closed doors)?  1  Do you often feel tired, fatigued, or sleepy during the daytime? 0  Has anyone observed you stop breathing during your sleep? 0  Do you have, or are you being treated for high blood pressure? 1  BMI more than 35 kg/m2? 0  Age over 51 years old? 1  Gender: 1

## 2015-03-08 ENCOUNTER — Encounter (HOSPITAL_COMMUNITY): Payer: Self-pay | Admitting: Orthopedic Surgery

## 2015-03-15 DIAGNOSIS — N186 End stage renal disease: Secondary | ICD-10-CM | POA: Diagnosis not present

## 2015-03-15 DIAGNOSIS — E1129 Type 2 diabetes mellitus with other diabetic kidney complication: Secondary | ICD-10-CM | POA: Diagnosis not present

## 2015-03-15 DIAGNOSIS — Z992 Dependence on renal dialysis: Secondary | ICD-10-CM | POA: Diagnosis not present

## 2015-03-16 DIAGNOSIS — Z89412 Acquired absence of left great toe: Secondary | ICD-10-CM | POA: Diagnosis not present

## 2015-03-16 DIAGNOSIS — T8781 Dehiscence of amputation stump: Secondary | ICD-10-CM | POA: Diagnosis not present

## 2015-03-16 DIAGNOSIS — N186 End stage renal disease: Secondary | ICD-10-CM | POA: Diagnosis not present

## 2015-03-16 DIAGNOSIS — Z89422 Acquired absence of other left toe(s): Secondary | ICD-10-CM | POA: Diagnosis not present

## 2015-03-16 DIAGNOSIS — R531 Weakness: Secondary | ICD-10-CM | POA: Diagnosis not present

## 2015-03-16 DIAGNOSIS — E119 Type 2 diabetes mellitus without complications: Secondary | ICD-10-CM | POA: Diagnosis not present

## 2015-03-17 DIAGNOSIS — E119 Type 2 diabetes mellitus without complications: Secondary | ICD-10-CM | POA: Diagnosis not present

## 2015-03-17 DIAGNOSIS — N2581 Secondary hyperparathyroidism of renal origin: Secondary | ICD-10-CM | POA: Diagnosis not present

## 2015-03-17 DIAGNOSIS — N186 End stage renal disease: Secondary | ICD-10-CM | POA: Diagnosis not present

## 2015-03-17 DIAGNOSIS — D631 Anemia in chronic kidney disease: Secondary | ICD-10-CM | POA: Diagnosis not present

## 2015-03-17 DIAGNOSIS — D509 Iron deficiency anemia, unspecified: Secondary | ICD-10-CM | POA: Diagnosis not present

## 2015-03-20 DIAGNOSIS — N186 End stage renal disease: Secondary | ICD-10-CM | POA: Diagnosis not present

## 2015-03-20 DIAGNOSIS — D631 Anemia in chronic kidney disease: Secondary | ICD-10-CM | POA: Diagnosis not present

## 2015-03-20 DIAGNOSIS — E119 Type 2 diabetes mellitus without complications: Secondary | ICD-10-CM | POA: Diagnosis not present

## 2015-03-20 DIAGNOSIS — D509 Iron deficiency anemia, unspecified: Secondary | ICD-10-CM | POA: Diagnosis not present

## 2015-03-20 DIAGNOSIS — N2581 Secondary hyperparathyroidism of renal origin: Secondary | ICD-10-CM | POA: Diagnosis not present

## 2015-03-21 DIAGNOSIS — R531 Weakness: Secondary | ICD-10-CM | POA: Diagnosis not present

## 2015-03-21 DIAGNOSIS — Z89412 Acquired absence of left great toe: Secondary | ICD-10-CM | POA: Diagnosis not present

## 2015-03-21 DIAGNOSIS — Z89422 Acquired absence of other left toe(s): Secondary | ICD-10-CM | POA: Diagnosis not present

## 2015-03-21 DIAGNOSIS — E119 Type 2 diabetes mellitus without complications: Secondary | ICD-10-CM | POA: Diagnosis not present

## 2015-03-21 DIAGNOSIS — N186 End stage renal disease: Secondary | ICD-10-CM | POA: Diagnosis not present

## 2015-03-21 DIAGNOSIS — T8781 Dehiscence of amputation stump: Secondary | ICD-10-CM | POA: Diagnosis not present

## 2015-03-22 DIAGNOSIS — D509 Iron deficiency anemia, unspecified: Secondary | ICD-10-CM | POA: Diagnosis not present

## 2015-03-22 DIAGNOSIS — N186 End stage renal disease: Secondary | ICD-10-CM | POA: Diagnosis not present

## 2015-03-22 DIAGNOSIS — D631 Anemia in chronic kidney disease: Secondary | ICD-10-CM | POA: Diagnosis not present

## 2015-03-22 DIAGNOSIS — N2581 Secondary hyperparathyroidism of renal origin: Secondary | ICD-10-CM | POA: Diagnosis not present

## 2015-03-22 DIAGNOSIS — E119 Type 2 diabetes mellitus without complications: Secondary | ICD-10-CM | POA: Diagnosis not present

## 2015-03-23 ENCOUNTER — Other Ambulatory Visit (HOSPITAL_COMMUNITY): Payer: Self-pay | Admitting: Orthopedic Surgery

## 2015-03-23 DIAGNOSIS — N186 End stage renal disease: Secondary | ICD-10-CM | POA: Diagnosis not present

## 2015-03-23 DIAGNOSIS — T8781 Dehiscence of amputation stump: Secondary | ICD-10-CM | POA: Diagnosis not present

## 2015-03-23 DIAGNOSIS — Z89422 Acquired absence of other left toe(s): Secondary | ICD-10-CM | POA: Diagnosis not present

## 2015-03-23 DIAGNOSIS — R531 Weakness: Secondary | ICD-10-CM | POA: Diagnosis not present

## 2015-03-23 DIAGNOSIS — E119 Type 2 diabetes mellitus without complications: Secondary | ICD-10-CM | POA: Diagnosis not present

## 2015-03-23 DIAGNOSIS — Z89412 Acquired absence of left great toe: Secondary | ICD-10-CM | POA: Diagnosis not present

## 2015-03-24 DIAGNOSIS — D631 Anemia in chronic kidney disease: Secondary | ICD-10-CM | POA: Diagnosis not present

## 2015-03-24 DIAGNOSIS — N2581 Secondary hyperparathyroidism of renal origin: Secondary | ICD-10-CM | POA: Diagnosis not present

## 2015-03-24 DIAGNOSIS — N186 End stage renal disease: Secondary | ICD-10-CM | POA: Diagnosis not present

## 2015-03-24 DIAGNOSIS — D509 Iron deficiency anemia, unspecified: Secondary | ICD-10-CM | POA: Diagnosis not present

## 2015-03-24 DIAGNOSIS — E119 Type 2 diabetes mellitus without complications: Secondary | ICD-10-CM | POA: Diagnosis not present

## 2015-03-27 DIAGNOSIS — D509 Iron deficiency anemia, unspecified: Secondary | ICD-10-CM | POA: Diagnosis not present

## 2015-03-27 DIAGNOSIS — N2581 Secondary hyperparathyroidism of renal origin: Secondary | ICD-10-CM | POA: Diagnosis not present

## 2015-03-27 DIAGNOSIS — N186 End stage renal disease: Secondary | ICD-10-CM | POA: Diagnosis not present

## 2015-03-27 DIAGNOSIS — D631 Anemia in chronic kidney disease: Secondary | ICD-10-CM | POA: Diagnosis not present

## 2015-03-27 DIAGNOSIS — E119 Type 2 diabetes mellitus without complications: Secondary | ICD-10-CM | POA: Diagnosis not present

## 2015-03-28 DIAGNOSIS — E11621 Type 2 diabetes mellitus with foot ulcer: Secondary | ICD-10-CM | POA: Diagnosis not present

## 2015-03-28 DIAGNOSIS — E1121 Type 2 diabetes mellitus with diabetic nephropathy: Secondary | ICD-10-CM | POA: Diagnosis not present

## 2015-03-28 DIAGNOSIS — Z89422 Acquired absence of other left toe(s): Secondary | ICD-10-CM | POA: Diagnosis not present

## 2015-03-28 DIAGNOSIS — Z794 Long term (current) use of insulin: Secondary | ICD-10-CM | POA: Diagnosis not present

## 2015-03-29 DIAGNOSIS — N186 End stage renal disease: Secondary | ICD-10-CM | POA: Diagnosis not present

## 2015-03-29 DIAGNOSIS — D509 Iron deficiency anemia, unspecified: Secondary | ICD-10-CM | POA: Diagnosis not present

## 2015-03-29 DIAGNOSIS — N2581 Secondary hyperparathyroidism of renal origin: Secondary | ICD-10-CM | POA: Diagnosis not present

## 2015-03-29 DIAGNOSIS — D631 Anemia in chronic kidney disease: Secondary | ICD-10-CM | POA: Diagnosis not present

## 2015-03-29 DIAGNOSIS — E119 Type 2 diabetes mellitus without complications: Secondary | ICD-10-CM | POA: Diagnosis not present

## 2015-03-31 ENCOUNTER — Inpatient Hospital Stay (HOSPITAL_COMMUNITY): Admission: RE | Admit: 2015-03-31 | Payer: Medicare Other | Source: Ambulatory Visit | Admitting: Orthopedic Surgery

## 2015-03-31 ENCOUNTER — Encounter (HOSPITAL_COMMUNITY): Admission: RE | Payer: Self-pay | Source: Ambulatory Visit

## 2015-03-31 DIAGNOSIS — N2581 Secondary hyperparathyroidism of renal origin: Secondary | ICD-10-CM | POA: Diagnosis not present

## 2015-03-31 DIAGNOSIS — N186 End stage renal disease: Secondary | ICD-10-CM | POA: Diagnosis not present

## 2015-03-31 DIAGNOSIS — D509 Iron deficiency anemia, unspecified: Secondary | ICD-10-CM | POA: Diagnosis not present

## 2015-03-31 DIAGNOSIS — D631 Anemia in chronic kidney disease: Secondary | ICD-10-CM | POA: Diagnosis not present

## 2015-03-31 DIAGNOSIS — E119 Type 2 diabetes mellitus without complications: Secondary | ICD-10-CM | POA: Diagnosis not present

## 2015-03-31 SURGERY — AMPUTATION BELOW KNEE
Anesthesia: General | Laterality: Left

## 2015-04-03 DIAGNOSIS — E119 Type 2 diabetes mellitus without complications: Secondary | ICD-10-CM | POA: Diagnosis not present

## 2015-04-03 DIAGNOSIS — N186 End stage renal disease: Secondary | ICD-10-CM | POA: Diagnosis not present

## 2015-04-03 DIAGNOSIS — N2581 Secondary hyperparathyroidism of renal origin: Secondary | ICD-10-CM | POA: Diagnosis not present

## 2015-04-03 DIAGNOSIS — D631 Anemia in chronic kidney disease: Secondary | ICD-10-CM | POA: Diagnosis not present

## 2015-04-03 DIAGNOSIS — D509 Iron deficiency anemia, unspecified: Secondary | ICD-10-CM | POA: Diagnosis not present

## 2015-04-05 DIAGNOSIS — D631 Anemia in chronic kidney disease: Secondary | ICD-10-CM | POA: Diagnosis not present

## 2015-04-05 DIAGNOSIS — D509 Iron deficiency anemia, unspecified: Secondary | ICD-10-CM | POA: Diagnosis not present

## 2015-04-05 DIAGNOSIS — N2581 Secondary hyperparathyroidism of renal origin: Secondary | ICD-10-CM | POA: Diagnosis not present

## 2015-04-05 DIAGNOSIS — E119 Type 2 diabetes mellitus without complications: Secondary | ICD-10-CM | POA: Diagnosis not present

## 2015-04-05 DIAGNOSIS — N186 End stage renal disease: Secondary | ICD-10-CM | POA: Diagnosis not present

## 2015-04-06 ENCOUNTER — Other Ambulatory Visit (HOSPITAL_COMMUNITY): Payer: Self-pay | Admitting: Orthopedic Surgery

## 2015-04-07 DIAGNOSIS — N186 End stage renal disease: Secondary | ICD-10-CM | POA: Diagnosis not present

## 2015-04-07 DIAGNOSIS — D509 Iron deficiency anemia, unspecified: Secondary | ICD-10-CM | POA: Diagnosis not present

## 2015-04-07 DIAGNOSIS — E119 Type 2 diabetes mellitus without complications: Secondary | ICD-10-CM | POA: Diagnosis not present

## 2015-04-07 DIAGNOSIS — N2581 Secondary hyperparathyroidism of renal origin: Secondary | ICD-10-CM | POA: Diagnosis not present

## 2015-04-07 DIAGNOSIS — D631 Anemia in chronic kidney disease: Secondary | ICD-10-CM | POA: Diagnosis not present

## 2015-04-10 DIAGNOSIS — D631 Anemia in chronic kidney disease: Secondary | ICD-10-CM | POA: Diagnosis not present

## 2015-04-10 DIAGNOSIS — N2581 Secondary hyperparathyroidism of renal origin: Secondary | ICD-10-CM | POA: Diagnosis not present

## 2015-04-10 DIAGNOSIS — D509 Iron deficiency anemia, unspecified: Secondary | ICD-10-CM | POA: Diagnosis not present

## 2015-04-10 DIAGNOSIS — N186 End stage renal disease: Secondary | ICD-10-CM | POA: Diagnosis not present

## 2015-04-10 DIAGNOSIS — E119 Type 2 diabetes mellitus without complications: Secondary | ICD-10-CM | POA: Diagnosis not present

## 2015-04-12 DIAGNOSIS — E119 Type 2 diabetes mellitus without complications: Secondary | ICD-10-CM | POA: Diagnosis not present

## 2015-04-12 DIAGNOSIS — D631 Anemia in chronic kidney disease: Secondary | ICD-10-CM | POA: Diagnosis not present

## 2015-04-12 DIAGNOSIS — N186 End stage renal disease: Secondary | ICD-10-CM | POA: Diagnosis not present

## 2015-04-12 DIAGNOSIS — N2581 Secondary hyperparathyroidism of renal origin: Secondary | ICD-10-CM | POA: Diagnosis not present

## 2015-04-12 DIAGNOSIS — D509 Iron deficiency anemia, unspecified: Secondary | ICD-10-CM | POA: Diagnosis not present

## 2015-04-13 ENCOUNTER — Encounter (HOSPITAL_COMMUNITY): Payer: Self-pay | Admitting: *Deleted

## 2015-04-13 DIAGNOSIS — E119 Type 2 diabetes mellitus without complications: Secondary | ICD-10-CM | POA: Diagnosis not present

## 2015-04-13 DIAGNOSIS — D509 Iron deficiency anemia, unspecified: Secondary | ICD-10-CM | POA: Diagnosis not present

## 2015-04-13 DIAGNOSIS — D631 Anemia in chronic kidney disease: Secondary | ICD-10-CM | POA: Diagnosis not present

## 2015-04-13 DIAGNOSIS — N186 End stage renal disease: Secondary | ICD-10-CM | POA: Diagnosis not present

## 2015-04-13 DIAGNOSIS — N2581 Secondary hyperparathyroidism of renal origin: Secondary | ICD-10-CM | POA: Diagnosis not present

## 2015-04-13 MED ORDER — CEFAZOLIN SODIUM-DEXTROSE 2-3 GM-% IV SOLR
2.0000 g | INTRAVENOUS | Status: AC
  Administered 2015-04-14: 2 g via INTRAVENOUS
  Filled 2015-04-13: qty 50

## 2015-04-13 MED ORDER — CHLORHEXIDINE GLUCONATE 4 % EX LIQD
60.0000 mL | Freq: Once | CUTANEOUS | Status: DC
Start: 2015-04-14 — End: 2015-04-14

## 2015-04-13 NOTE — Progress Notes (Signed)
Pt denies cardiac history, chest pain or sob. Pt is diabetic. States his fasting blood sugars vary. Instructed him to check blood sugar in the AM and if it's lower than 70, he may take 4 ounces of clear juice (apple or cranberry), glucose tabs or glucose gel and to recheck blood sugar 15 min after treatment. If still below 70, I instructed him to call the Surgical Short Stay unit and talk with a nurse. He voiced understanding.

## 2015-04-14 ENCOUNTER — Encounter (HOSPITAL_COMMUNITY)
Admission: RE | Disposition: A | Discharge status: Rehabilitation Facility | Payer: Self-pay | Source: Ambulatory Visit | Attending: Orthopedic Surgery

## 2015-04-14 ENCOUNTER — Inpatient Hospital Stay (HOSPITAL_COMMUNITY): Payer: Medicare Other | Admitting: Anesthesiology

## 2015-04-14 ENCOUNTER — Inpatient Hospital Stay (HOSPITAL_COMMUNITY)
Admission: RE | Admit: 2015-04-14 | Discharge: 2015-04-17 | DRG: 907 | Disposition: A | Discharge status: Rehabilitation Facility | Payer: Medicare Other | Source: Ambulatory Visit | Attending: Orthopedic Surgery | Admitting: Orthopedic Surgery

## 2015-04-14 ENCOUNTER — Encounter (HOSPITAL_COMMUNITY): Payer: Self-pay | Admitting: *Deleted

## 2015-04-14 DIAGNOSIS — E1122 Type 2 diabetes mellitus with diabetic chronic kidney disease: Secondary | ICD-10-CM | POA: Diagnosis present

## 2015-04-14 DIAGNOSIS — N186 End stage renal disease: Secondary | ICD-10-CM | POA: Diagnosis present

## 2015-04-14 DIAGNOSIS — Z6832 Body mass index (BMI) 32.0-32.9, adult: Secondary | ICD-10-CM | POA: Diagnosis not present

## 2015-04-14 DIAGNOSIS — I120 Hypertensive chronic kidney disease with stage 5 chronic kidney disease or end stage renal disease: Secondary | ICD-10-CM | POA: Diagnosis present

## 2015-04-14 DIAGNOSIS — Z794 Long term (current) use of insulin: Secondary | ICD-10-CM | POA: Diagnosis not present

## 2015-04-14 DIAGNOSIS — Z992 Dependence on renal dialysis: Secondary | ICD-10-CM | POA: Diagnosis not present

## 2015-04-14 DIAGNOSIS — T8130XA Disruption of wound, unspecified, initial encounter: Principal | ICD-10-CM | POA: Diagnosis present

## 2015-04-14 DIAGNOSIS — K219 Gastro-esophageal reflux disease without esophagitis: Secondary | ICD-10-CM | POA: Diagnosis present

## 2015-04-14 DIAGNOSIS — Z87891 Personal history of nicotine dependence: Secondary | ICD-10-CM

## 2015-04-14 DIAGNOSIS — D62 Acute posthemorrhagic anemia: Secondary | ICD-10-CM | POA: Diagnosis not present

## 2015-04-14 DIAGNOSIS — N2581 Secondary hyperparathyroidism of renal origin: Secondary | ICD-10-CM | POA: Diagnosis present

## 2015-04-14 DIAGNOSIS — K5900 Constipation, unspecified: Secondary | ICD-10-CM | POA: Diagnosis present

## 2015-04-14 DIAGNOSIS — T8781 Dehiscence of amputation stump: Secondary | ICD-10-CM | POA: Diagnosis not present

## 2015-04-14 DIAGNOSIS — E1129 Type 2 diabetes mellitus with other diabetic kidney complication: Secondary | ICD-10-CM | POA: Diagnosis not present

## 2015-04-14 DIAGNOSIS — N189 Chronic kidney disease, unspecified: Secondary | ICD-10-CM | POA: Diagnosis not present

## 2015-04-14 DIAGNOSIS — I70248 Atherosclerosis of native arteries of left leg with ulceration of other part of lower left leg: Secondary | ICD-10-CM | POA: Diagnosis not present

## 2015-04-14 DIAGNOSIS — E11610 Type 2 diabetes mellitus with diabetic neuropathic arthropathy: Secondary | ICD-10-CM | POA: Diagnosis present

## 2015-04-14 DIAGNOSIS — L97829 Non-pressure chronic ulcer of other part of left lower leg with unspecified severity: Secondary | ICD-10-CM | POA: Diagnosis not present

## 2015-04-14 DIAGNOSIS — E1142 Type 2 diabetes mellitus with diabetic polyneuropathy: Secondary | ICD-10-CM | POA: Diagnosis present

## 2015-04-14 DIAGNOSIS — D649 Anemia, unspecified: Secondary | ICD-10-CM | POA: Diagnosis not present

## 2015-04-14 DIAGNOSIS — T8752 Necrosis of amputation stump, left upper extremity: Secondary | ICD-10-CM | POA: Diagnosis not present

## 2015-04-14 HISTORY — DX: Anemia, unspecified: D64.9

## 2015-04-14 HISTORY — PX: AMPUTATION: SHX166

## 2015-04-14 LAB — CBC
HCT: 28.8 % — ABNORMAL LOW (ref 39.0–52.0)
HEMATOCRIT: 29 % — AB (ref 39.0–52.0)
HEMOGLOBIN: 8.6 g/dL — AB (ref 13.0–17.0)
Hemoglobin: 8.9 g/dL — ABNORMAL LOW (ref 13.0–17.0)
MCH: 26.9 pg (ref 26.0–34.0)
MCH: 27.6 pg (ref 26.0–34.0)
MCHC: 29.7 g/dL — AB (ref 30.0–36.0)
MCHC: 30.9 g/dL (ref 30.0–36.0)
MCV: 90.6 fL (ref 78.0–100.0)
MCV: 89.4 fL (ref 78.0–100.0)
Platelets: 123 10*3/uL — ABNORMAL LOW (ref 150–400)
Platelets: 127 10*3/uL — ABNORMAL LOW (ref 150–400)
RBC: 3.2 MIL/uL — ABNORMAL LOW (ref 4.22–5.81)
RBC: 3.22 MIL/uL — ABNORMAL LOW (ref 4.22–5.81)
RDW: 17.1 % — ABNORMAL HIGH (ref 11.5–15.5)
RDW: 16.8 % — ABNORMAL HIGH (ref 11.5–15.5)
WBC: 7.6 10*3/uL (ref 4.0–10.5)
WBC: 8.3 10*3/uL (ref 4.0–10.5)

## 2015-04-14 LAB — RENAL FUNCTION PANEL
Albumin: 3.1 g/dL — ABNORMAL LOW (ref 3.5–5.0)
Anion gap: 12 (ref 5–15)
BUN: 31 mg/dL — ABNORMAL HIGH (ref 6–20)
CO2: 32 mmol/L (ref 22–32)
Calcium: 9.3 mg/dL (ref 8.9–10.3)
Chloride: 93 mmol/L — ABNORMAL LOW (ref 101–111)
Creatinine, Ser: 8.06 mg/dL — ABNORMAL HIGH (ref 0.61–1.24)
GFR calc Af Amer: 8 mL/min — ABNORMAL LOW (ref >60)
GFR calc non Af Amer: 7 mL/min — ABNORMAL LOW (ref >60)
Glucose, Bld: 172 mg/dL — ABNORMAL HIGH (ref 65–99)
Phosphorus: 6.6 mg/dL — ABNORMAL HIGH (ref 2.5–4.6)
Potassium: 3.8 mmol/L (ref 3.5–5.1)
Sodium: 137 mmol/L (ref 135–145)

## 2015-04-14 LAB — COMPREHENSIVE METABOLIC PANEL
ALBUMIN: 3.1 g/dL — AB (ref 3.5–5.0)
ALK PHOS: 49 U/L (ref 38–126)
ALT: 7 U/L — ABNORMAL LOW (ref 17–63)
ANION GAP: 13 (ref 5–15)
AST: 11 U/L — ABNORMAL LOW (ref 15–41)
BILIRUBIN TOTAL: 0.6 mg/dL (ref 0.3–1.2)
BUN: 31 mg/dL — ABNORMAL HIGH (ref 6–20)
CALCIUM: 9.6 mg/dL (ref 8.9–10.3)
CO2: 29 mmol/L (ref 22–32)
Chloride: 98 mmol/L — ABNORMAL LOW (ref 101–111)
Creatinine, Ser: 7.6 mg/dL — ABNORMAL HIGH (ref 0.61–1.24)
GFR calc Af Amer: 9 mL/min — ABNORMAL LOW (ref >60)
GFR calc non Af Amer: 7 mL/min — ABNORMAL LOW (ref >60)
GLUCOSE: 180 mg/dL — AB (ref 65–99)
Potassium: 4 mmol/L (ref 3.5–5.1)
Sodium: 140 mmol/L (ref 135–145)
TOTAL PROTEIN: 7 g/dL (ref 6.5–8.1)

## 2015-04-14 LAB — APTT: APTT: 38 s — AB (ref 24–37)

## 2015-04-14 LAB — GLUCOSE, CAPILLARY
GLUCOSE-CAPILLARY: 176 mg/dL — AB (ref 65–99)
GLUCOSE-CAPILLARY: 179 mg/dL — AB (ref 65–99)
GLUCOSE-CAPILLARY: 159 mg/dL — AB (ref 65–99)
Glucose-Capillary: 174 mg/dL — ABNORMAL HIGH (ref 65–99)

## 2015-04-14 LAB — PROTIME-INR
INR: 1.16 (ref 0.00–1.49)
Prothrombin Time: 15 seconds (ref 11.6–15.2)

## 2015-04-14 SURGERY — AMPUTATION BELOW KNEE
Anesthesia: General | Site: Knee | Laterality: Left

## 2015-04-14 MED ORDER — ONDANSETRON HCL 4 MG/2ML IJ SOLN
INTRAMUSCULAR | Status: DC | PRN
  Administered 2015-04-14: 4 mg via INTRAVENOUS

## 2015-04-14 MED ORDER — OXYCODONE HCL 5 MG/5ML PO SOLN: 5.0000 mg | Freq: Once | ORAL | Status: AC | PRN

## 2015-04-14 MED ORDER — CALCITRIOL 0.5 MCG PO CAPS
2.5000 ug | ORAL_CAPSULE | ORAL | Status: DC
  Administered 2015-04-15 – 2015-04-17 (×2): 2.5 ug via ORAL
  Filled 2015-04-14 (×2): qty 5

## 2015-04-14 MED ORDER — PENTAFLUOROPROP-TETRAFLUOROETH EX AERO: 1.0000 "application " | INHALATION_SPRAY | CUTANEOUS | Status: DC | PRN

## 2015-04-14 MED ORDER — PROPOFOL 10 MG/ML IV BOLUS
INTRAVENOUS | Status: AC
  Filled 2015-04-14: qty 20

## 2015-04-14 MED ORDER — ASPIRIN EC 325 MG PO TBEC
325.0000 mg | DELAYED_RELEASE_TABLET | Freq: Every day | ORAL | Status: DC
  Administered 2015-04-15 – 2015-04-17 (×3): 325 mg via ORAL
  Filled 2015-04-14 (×3): qty 1

## 2015-04-14 MED ORDER — INSULIN DETEMIR 100 UNIT/ML ~~LOC~~ SOLN
20.0000 [IU] | Freq: Every day | SUBCUTANEOUS | Status: DC
  Administered 2015-04-14 – 2015-04-16 (×3): 20 [IU] via SUBCUTANEOUS
  Filled 2015-04-14 (×6): qty 0.2

## 2015-04-14 MED ORDER — REPAGLINIDE 0.5 MG PO TABS
0.5000 mg | ORAL_TABLET | Freq: Two times a day (BID) | ORAL | Status: DC
  Administered 2015-04-14 – 2015-04-17 (×4): 0.5 mg via ORAL
  Filled 2015-04-14 (×8): qty 1

## 2015-04-14 MED ORDER — INSULIN ASPART 100 UNIT/ML ~~LOC~~ SOLN
3.0000 [IU] | Freq: Three times a day (TID) | SUBCUTANEOUS | Status: DC
  Administered 2015-04-14: 3 [IU] via SUBCUTANEOUS
  Administered 2015-04-16: 100 [IU] via SUBCUTANEOUS

## 2015-04-14 MED ORDER — 0.9 % SODIUM CHLORIDE (POUR BTL) OPTIME
TOPICAL | Status: DC | PRN
  Administered 2015-04-14: 1000 mL

## 2015-04-14 MED ORDER — CEFAZOLIN SODIUM-DEXTROSE 2-3 GM-% IV SOLR
2.0000 g | Freq: Four times a day (QID) | INTRAVENOUS | Status: AC
  Administered 2015-04-14 – 2015-04-15 (×3): 2 g via INTRAVENOUS
  Filled 2015-04-14 (×3): qty 50

## 2015-04-14 MED ORDER — FENTANYL CITRATE (PF) 100 MCG/2ML IJ SOLN
25.0000 ug | INTRAMUSCULAR | Status: DC | PRN
  Administered 2015-04-14 (×3): 50 ug via INTRAVENOUS

## 2015-04-14 MED ORDER — FENTANYL CITRATE (PF) 250 MCG/5ML IJ SOLN
INTRAMUSCULAR | Status: AC
  Filled 2015-04-14: qty 5

## 2015-04-14 MED ORDER — SODIUM CHLORIDE 0.9 % IV SOLN: INTRAVENOUS | Status: DC

## 2015-04-14 MED ORDER — POLYETHYLENE GLYCOL 3350 17 G PO PACK: 17.0000 g | PACK | Freq: Every day | ORAL | Status: DC | PRN

## 2015-04-14 MED ORDER — SODIUM CHLORIDE 0.9 % IV SOLN
125.0000 mg | INTRAVENOUS | Status: AC
  Administered 2015-04-15: 125 mg via INTRAVENOUS
  Filled 2015-04-14 (×2): qty 10

## 2015-04-14 MED ORDER — SODIUM CHLORIDE 0.9 % IV SOLN: 100.0000 mL | INTRAVENOUS | Status: DC | PRN

## 2015-04-14 MED ORDER — PROPOFOL 10 MG/ML IV BOLUS
INTRAVENOUS | Status: DC | PRN
  Administered 2015-04-14: 160 mg via INTRAVENOUS

## 2015-04-14 MED ORDER — DARBEPOETIN ALFA 150 MCG/0.3ML IJ SOSY
150.0000 ug | PREFILLED_SYRINGE | INTRAMUSCULAR | Status: DC
  Administered 2015-04-17: 150 ug via INTRAVENOUS

## 2015-04-14 MED ORDER — RENA-VITE PO TABS
1.0000 | ORAL_TABLET | Freq: Every day | ORAL | Status: DC
  Administered 2015-04-14 – 2015-04-16 (×3): 1 via ORAL
  Filled 2015-04-14 (×3): qty 1

## 2015-04-14 MED ORDER — DOCUSATE SODIUM 100 MG PO CAPS
100.0000 mg | ORAL_CAPSULE | Freq: Two times a day (BID) | ORAL | Status: DC
  Administered 2015-04-14 – 2015-04-17 (×6): 100 mg via ORAL
  Filled 2015-04-14 (×6): qty 1

## 2015-04-14 MED ORDER — INSULIN ASPART 100 UNIT/ML ~~LOC~~ SOLN
0.0000 [IU] | Freq: Three times a day (TID) | SUBCUTANEOUS | Status: DC
  Administered 2015-04-14: 2 [IU] via SUBCUTANEOUS

## 2015-04-14 MED ORDER — METOCLOPRAMIDE HCL 5 MG PO TABS: 5.0000 mg | ORAL_TABLET | Freq: Three times a day (TID) | ORAL | Status: DC | PRN

## 2015-04-14 MED ORDER — HEPARIN SODIUM (PORCINE) 1000 UNIT/ML DIALYSIS
1000.0000 [IU] | INTRAMUSCULAR | Status: DC | PRN
  Administered 2015-04-17: 1000 [IU] via INTRAVENOUS_CENTRAL
  Filled 2015-04-14 (×2): qty 1

## 2015-04-14 MED ORDER — BISACODYL 5 MG PO TBEC
5.0000 mg | DELAYED_RELEASE_TABLET | Freq: Every day | ORAL | Status: DC | PRN
  Administered 2015-04-16: 5 mg via ORAL
  Filled 2015-04-14: qty 1

## 2015-04-14 MED ORDER — FENTANYL CITRATE (PF) 100 MCG/2ML IJ SOLN
INTRAMUSCULAR | Status: AC
  Filled 2015-04-14: qty 2

## 2015-04-14 MED ORDER — OXYCODONE HCL 5 MG PO TABS
5.0000 mg | ORAL_TABLET | Freq: Once | ORAL | Status: AC | PRN
  Administered 2015-04-14: 5 mg via ORAL

## 2015-04-14 MED ORDER — LIDOCAINE HCL (CARDIAC) 20 MG/ML IV SOLN
INTRAVENOUS | Status: DC | PRN
  Administered 2015-04-14: 40 mg via INTRAVENOUS

## 2015-04-14 MED ORDER — HYDROMORPHONE HCL 1 MG/ML IJ SOLN
1.0000 mg | INTRAMUSCULAR | Status: DC | PRN
  Administered 2015-04-14 – 2015-04-15 (×4): 1 mg via INTRAVENOUS
  Filled 2015-04-14 (×3): qty 1

## 2015-04-14 MED ORDER — ONDANSETRON HCL 4 MG PO TABS
4.0000 mg | ORAL_TABLET | Freq: Four times a day (QID) | ORAL | Status: DC | PRN
  Administered 2015-04-16 (×2): 4 mg via ORAL
  Filled 2015-04-14 (×2): qty 1

## 2015-04-14 MED ORDER — ONDANSETRON HCL 4 MG/2ML IJ SOLN: 4.0000 mg | Freq: Once | INTRAMUSCULAR | Status: DC | PRN

## 2015-04-14 MED ORDER — MIDAZOLAM HCL 2 MG/2ML IJ SOLN
INTRAMUSCULAR | Status: AC
  Filled 2015-04-14: qty 4

## 2015-04-14 MED ORDER — OXYCODONE HCL 5 MG PO TABS
ORAL_TABLET | ORAL | Status: AC
  Administered 2015-04-15: 5 mg via ORAL
  Filled 2015-04-14: qty 1

## 2015-04-14 MED ORDER — LIDOCAINE HCL (PF) 1 % IJ SOLN: 5.0000 mL | INTRAMUSCULAR | Status: DC | PRN

## 2015-04-14 MED ORDER — MIDAZOLAM HCL 2 MG/2ML IJ SOLN
INTRAMUSCULAR | Status: AC
  Filled 2015-04-14: qty 2

## 2015-04-14 MED ORDER — FENTANYL CITRATE (PF) 100 MCG/2ML IJ SOLN
INTRAMUSCULAR | Status: DC | PRN
  Administered 2015-04-14 (×3): 50 ug via INTRAVENOUS

## 2015-04-14 MED ORDER — ACETAMINOPHEN 325 MG PO TABS
650.0000 mg | ORAL_TABLET | Freq: Four times a day (QID) | ORAL | Status: DC | PRN
  Administered 2015-04-15: 650 mg via ORAL
  Filled 2015-04-14: qty 2

## 2015-04-14 MED ORDER — SUCCINYLCHOLINE CHLORIDE 20 MG/ML IJ SOLN
INTRAMUSCULAR | Status: DC | PRN
  Administered 2015-04-14: 120 mg via INTRAVENOUS

## 2015-04-14 MED ORDER — MIDAZOLAM HCL 5 MG/5ML IJ SOLN
INTRAMUSCULAR | Status: DC | PRN
  Administered 2015-04-14: 2 mg via INTRAVENOUS

## 2015-04-14 MED ORDER — PHENYLEPHRINE HCL 10 MG/ML IJ SOLN
INTRAMUSCULAR | Status: DC | PRN
  Administered 2015-04-14: 80 ug via INTRAVENOUS

## 2015-04-14 MED ORDER — ALTEPLASE 2 MG IJ SOLR
2.0000 mg | Freq: Once | INTRAMUSCULAR | Status: DC | PRN
  Filled 2015-04-14: qty 2

## 2015-04-14 MED ORDER — LIDOCAINE-PRILOCAINE 2.5-2.5 % EX CREA
1.0000 "application " | TOPICAL_CREAM | CUTANEOUS | Status: DC | PRN
  Filled 2015-04-14: qty 5

## 2015-04-14 MED ORDER — METOCLOPRAMIDE HCL 5 MG/ML IJ SOLN: 5.0000 mg | Freq: Three times a day (TID) | INTRAMUSCULAR | Status: DC | PRN

## 2015-04-14 MED ORDER — CALCIUM ACETATE (PHOS BINDER) 667 MG PO CAPS
2668.0000 mg | ORAL_CAPSULE | Freq: Three times a day (TID) | ORAL | Status: DC
  Administered 2015-04-14 – 2015-04-17 (×6): 2668 mg via ORAL
  Filled 2015-04-14 (×5): qty 4

## 2015-04-14 MED ORDER — ACETAMINOPHEN 650 MG RE SUPP: 650.0000 mg | Freq: Four times a day (QID) | RECTAL | Status: DC | PRN

## 2015-04-14 MED ORDER — ONDANSETRON HCL 4 MG/2ML IJ SOLN: 4.0000 mg | Freq: Four times a day (QID) | INTRAMUSCULAR | Status: DC | PRN

## 2015-04-14 MED ORDER — DEXTROSE 5 % IV SOLN
500.0000 mg | Freq: Four times a day (QID) | INTRAVENOUS | Status: DC | PRN
  Filled 2015-04-14: qty 5

## 2015-04-14 MED ORDER — METHOCARBAMOL 500 MG PO TABS
500.0000 mg | ORAL_TABLET | Freq: Four times a day (QID) | ORAL | Status: DC | PRN
  Administered 2015-04-14 – 2015-04-16 (×5): 500 mg via ORAL
  Filled 2015-04-14 (×5): qty 1

## 2015-04-14 MED ORDER — OXYCODONE HCL 5 MG PO TABS
5.0000 mg | ORAL_TABLET | ORAL | Status: DC | PRN
  Administered 2015-04-14 – 2015-04-15 (×4): 10 mg via ORAL
  Administered 2015-04-15: 5 mg via ORAL
  Administered 2015-04-15 – 2015-04-16 (×6): 10 mg via ORAL
  Filled 2015-04-14 (×10): qty 2

## 2015-04-14 MED ORDER — NA FERRIC GLUC CPLX IN SUCROSE 12.5 MG/ML IV SOLN
125.0000 mg | INTRAVENOUS | Status: DC
  Administered 2015-04-17: 125 mg via INTRAVENOUS
  Filled 2015-04-14 (×2): qty 10

## 2015-04-14 MED ORDER — SODIUM CHLORIDE 0.9 % IV SOLN
Freq: Once | INTRAVENOUS | Status: AC
  Administered 2015-04-14 (×2): via INTRAVENOUS

## 2015-04-14 SURGICAL SUPPLY — 38 items
BLADE SAW RECIP 87.9 MT (BLADE) ×3 IMPLANT
BLADE SURG 21 STRL SS (BLADE) ×3 IMPLANT
BNDG COHESIVE 4X5 TAN STRL (GAUZE/BANDAGES/DRESSINGS) ×2 IMPLANT
BNDG COHESIVE 6X5 TAN STRL LF (GAUZE/BANDAGES/DRESSINGS) ×6 IMPLANT
BNDG GAUZE ELAST 4 BULKY (GAUZE/BANDAGES/DRESSINGS) ×4 IMPLANT
COVER SURGICAL LIGHT HANDLE (MISCELLANEOUS) ×6 IMPLANT
CUFF TOURNIQUET SINGLE 34IN LL (TOURNIQUET CUFF) IMPLANT
CUFF TOURNIQUET SINGLE 44IN (TOURNIQUET CUFF) IMPLANT
DRAPE EXTREMITY T 121X128X90 (DRAPE) ×3 IMPLANT
DRAPE PROXIMA HALF (DRAPES) ×6 IMPLANT
DRAPE U-SHAPE 47X51 STRL (DRAPES) ×3 IMPLANT
DRSG ADAPTIC 3X8 NADH LF (GAUZE/BANDAGES/DRESSINGS) ×3 IMPLANT
DRSG PAD ABDOMINAL 8X10 ST (GAUZE/BANDAGES/DRESSINGS) ×3 IMPLANT
DURAPREP 26ML APPLICATOR (WOUND CARE) ×3 IMPLANT
ELECT REM PT RETURN 9FT ADLT (ELECTROSURGICAL) ×3
ELECTRODE REM PT RTRN 9FT ADLT (ELECTROSURGICAL) ×1 IMPLANT
GAUZE SPONGE 4X4 12PLY STRL (GAUZE/BANDAGES/DRESSINGS) ×3 IMPLANT
GLOVE BIOGEL PI IND STRL 9 (GLOVE) ×1 IMPLANT
GLOVE BIOGEL PI INDICATOR 9 (GLOVE) ×2
GLOVE SURG ORTHO 9.0 STRL STRW (GLOVE) ×3 IMPLANT
GOWN STRL REUS W/ TWL XL LVL3 (GOWN DISPOSABLE) ×2 IMPLANT
GOWN STRL REUS W/TWL XL LVL3 (GOWN DISPOSABLE) ×6
KIT BASIN OR (CUSTOM PROCEDURE TRAY) ×3 IMPLANT
KIT ROOM TURNOVER OR (KITS) ×3 IMPLANT
MANIFOLD NEPTUNE II (INSTRUMENTS) ×3 IMPLANT
NS IRRIG 1000ML POUR BTL (IV SOLUTION) ×3 IMPLANT
PACK GENERAL/GYN (CUSTOM PROCEDURE TRAY) ×3 IMPLANT
PAD ARMBOARD 7.5X6 YLW CONV (MISCELLANEOUS) ×6 IMPLANT
SPONGE GAUZE 4X4 12PLY STER LF (GAUZE/BANDAGES/DRESSINGS) ×2 IMPLANT
SPONGE LAP 18X18 X RAY DECT (DISPOSABLE) IMPLANT
STAPLER VISISTAT 35W (STAPLE) IMPLANT
STOCKINETTE IMPERVIOUS LG (DRAPES) ×3 IMPLANT
SUT SILK 2 0 (SUTURE) ×3
SUT SILK 2-0 18XBRD TIE 12 (SUTURE) ×1 IMPLANT
SUT VIC AB 1 CTX 27 (SUTURE) IMPLANT
TOWEL OR 17X24 6PK STRL BLUE (TOWEL DISPOSABLE) ×3 IMPLANT
TOWEL OR 17X26 10 PK STRL BLUE (TOWEL DISPOSABLE) ×3 IMPLANT
WATER STERILE IRR 1000ML POUR (IV SOLUTION) ×3 IMPLANT

## 2015-04-14 NOTE — Anesthesia Procedure Notes (Signed)
Procedure Name: Intubation Date/Time: 04/14/2015 2:39 PM Performed by: [place] Pink Pre-anesthesia Checklist: Patient identified, Emergency Drugs available, Suction available, Patient being monitored and Timeout performed Patient Re-evaluated:Patient Re-evaluated prior to inductionOxygen Delivery Method: Circle system utilized Preoxygenation: Pre-oxygenation with 100% oxygen Intubation Type: IV induction LMA: LMA with gastric port inserted LMA Size: 5.0 Laryngoscope Size: Mac and 4 Grade View: Grade II Tube type: Oral Tube size: 7.5 mm Number of attempts: 1 Placement Confirmation: ETT inserted through vocal cords under direct vision,  positive ETCO2 and breath sounds checked- equal and bilateral Secured at: 23 cm Tube secured with: Tape Dental Injury: Teeth and Oropharynx as per pre-operative assessment  Comments: LMA 5 with gastric port inserted, inadequate tidal volumes. LMA removed, DL as above note.

## 2015-04-14 NOTE — Consult Note (Signed)
[facility] Renal Consultation Note    Indication for Consultation:  Management of ESRD/hemodialysis; anemia, hypertension/volume and secondary hyperparathyroidism PCP:  HPI: [name] is a 49 y.o. male with ESRD secondary to DM on HD since December 2005,  HTN, hx of parathyroidectomy 2010, with chronic left foot wound s/p multiple procedures trying to salvage his left foot, for BKA today.  After receiving a second opinion by Dr. Colleen Can, he agreed to go ahead with the BKA. He has no CP, SOB, cough, N, V, D , Fever. His left foot is a little sore, but he has diminished sensation overall in his LE.  Anesthesiology had been unable to place a peripheral access. Consequently,  a central line was placed.  The pt wishes to have it removed post op. (I advised to keep unless he has a peripheral access established during surgery).  Past Medical History  Diagnosis Date  . Hypertension   . Chest pain   . Morbid obesity   . Bronchitis   . Blood transfusion   . H/O hiatal hernia   . Chronic kidney disease     d[facility], [facility]F, [facility]  . GERD (gastroesophageal reflux disease)   . Dysrhythmia     irregular heartbeat  . Hyperparathyroidism   . ESRD (end stage renal disease) on dialysis     first HD 06/19/04  . Pneumonia   . Diabetes mellitus     type 2  . Anemia    Past Surgical History  Procedure Laterality Date  . Av fistula placement  10/11/2010  . Ankle fusion      rt foot charcot joint 2008 with refusion  . Av fistula placement  07/02/2011    Procedure: INSERTION OF ARTERIOVENOUS (AV) GORE-TEX GRAFT ARM;  Surgeon: Elam Dutch, MD;  Location: [place];  Service: Vascular;  Laterality: Left;  . Thrombectomy    . Insertion of dialysis catheter  01/20/2012    Procedure: INSERTION OF DIALYSIS CATHETER;  Surgeon: Serafina Mitchell, MD;  Location: [place];  Service: Vascular;  Laterality: N/A;  insertion left internal jugular dialysis catheter 23cm  . Umbilcial hernia    .  Parathyroidectomy    . Avgg removal Right 11/06/2012    Procedure: REMOVAL OF ARTERIOVENOUS GORETEX GRAFT ([place]);  Surgeon: Mal Misty, MD;  Location: [place];  Service: Vascular;  Laterality: Right;  . Av fistula placement Right 11/06/2012    Procedure: INSERTION OF ARTERIOVENOUS (AV) GORE-TEX GRAFT ARM;  Surgeon: Mal Misty, MD;  Location: [place];  Service: Vascular;  Laterality: Right;  . Insertion of dialysis catheter Right 11/09/2012    Procedure: INSERTION OF DIALYSIS CATHETER;  Surgeon: Conrad Akins, MD;  Location: [place];  Service: Vascular;  Laterality: Right;  Right  Femoral Vein  . Venogram N/A 01/06/2014    Procedure: VENOGRAM;  Surgeon: Conrad , MD;  Location: [facility] CATH LAB;  Service: Cardiovascular;  Laterality: N/A;  . Amputation Left 01/20/2015    Procedure: Left Great Toe Amputation;  Surgeon: Newt Minion, MD;  Location: [place];  Service: Orthopedics;  Laterality: Left;  . Amputation Left 03/07/2015    Procedure: Left Foot 1st and 2nd Ray Amputation;  Surgeon: Newt Minion, MD;  Location: [place];  Service: Orthopedics;  Laterality: Left;   Family History  Problem Relation Age of Onset  . Heart disease Mother   . Kidney disease Mother   . Hypertension Mother    Social History:  reports that  he quit smoking about 2 months ago. His smoking use included Cigarettes. He has a 2 pack-year smoking history. He has never used smokeless tobacco. He reports that he does not drink alcohol or use illicit drugs. No Known Allergies Prior to Admission medications   Medication Sig Start Date End Date Taking? Authorizing Provider  b complex-vitamin c-folic acid (NEPHRO-VITE) 0.8 MG TABS Take 0.8 mg by mouth at bedtime.     Yes Historical Provider, MD  BD PEN NEEDLE NANO U/F 32G X 4 MM MISC  10/14/13  Yes Historical Provider, MD  calcium acetate (PHOSLO) 667 MG capsule Take 2,668 mg by mouth 3 (three) times daily with meals.   Yes Historical Provider, MD  LEVEMIR FLEXTOUCH 100 UNIT/ML Pen  Inject 35 Units into the skin 3 (three) times daily as needed. Per Sliding Scale 12/08/13  Yes Historical Provider, MD  oxyCODONE-acetaminophen (PERCOCET) 10-325 MG per tablet Take 1-2 tablets by mouth every 4 (four) hours as needed for pain. 03/07/15  Yes Newt Minion, MD  repaglinide (PRANDIN) 0.5 MG tablet Take 0.5 mg by mouth 2 (two) times daily before a meal.  12/08/13  Yes Historical Provider, MD   Current Facility-Administered Medications  Medication Dose Route Frequency Provider Last Rate Last Dose  . 0.9 %  sodium chloride infusion   Intravenous Once Roberts Gaudy, MD      . ceFAZolin (ANCEF) IVPB 2 g/50 mL premix  2 g Intravenous To SS-Surg Meridee Score V, MD      . chlorhexidine (HIBICLENS) 4 % liquid 4 application  60 mL Topical Once Newt Minion, MD       Labs: CBC:  Recent Labs Lab 04/14/15 1253  WBC 7.6  HGB 8.6*  HCT 29.0*  MCV 90.6  PLT 123*   CBG:  Recent Labs Lab 04/14/15 1243  GLUCAP 176*   ROS: As per HPI otherwise negative. Physical Exam: in Pre-op area Filed Vitals:   04/14/15 1250  BP: 143/60  Pulse: 93  Temp: 98.2 F (36.8 C)  TempSrc: Oral  Resp: 20  Height: 6\' 1"  (1.854 m)  Weight: 117.935 kg (260 lb)  SpO2: 100%     General: Well developed, well nourished, in no acute distress. Head: Normocephalic, atraumatic, sclera non-icteric, mucus membranes are moist Neck: Supple. JVD not elevated. Lungs: Clear bilaterally to auscultation without wheezes, rales, or rhonchi. Breathing is unlabored. Heart: RRR  Abdomen: Soft, non-tender, non-distended with normoactive bowel sounds.  Lower extremities: tr LE edema;  Left foot wound not examined Neuro: Alert and oriented X 3. Moves all extremities spontaneously. Psych:  Responds to questions appropriately with a normal affect. Dialysis Access: HERO (L) + bruit  Dialysis Orders:  [facility] - -rarely runs full time EDW 116- leaving a little below 2 K 2.25 Ca 450/800 HERO heparin 5000 with 2500 mid  Mircera 150 last 9/21 no Fe calcitriol 2.5 Recent labs;  Hgb 8.6 25% sat ferritin 1300 (July) iPTH 216 Ca upper 9s P 6-8 Assessment/Plan: 1. nonhealing foot wound- for BKA today Dr. Sharol Given 2. ESRD -  MWF - had HD Thursday in anticipation of surgery today; plan HD Saturday and back on schedule Monday - no heparin- since this is his 4th treatment this week - will only do 3 hours-orders placed under sign and held 3. Hypertension/volume  - not on BP meds; volume control only - re-establish edw 4. Anemia  - last outpt Hgb 8.6 -slight trend up tsat 25% -had been getting little Fe due  to ^ ferritin 1300s in July - will replete and continue on same ESA schedule - due for redose next week, transfuse prn 5. Metabolic bone disease -  Chronically elevated phosphorus iPTH 216- takes 4 phoslo and 2 velphoro 6. Nutrition - needs renal carb mod and multivit 7. Thrombocytopenia - follow platelets. 8. DM - per primary  Myriam Jacobson, PA-C [place] [phone] 04/14/2015, 1:33 PM   I have seen and examined this patient and agree with plan per Amalia Hailey.  49yo BM with ESRD admitted to undergo Lt BKA for non healing foot wound.  He is normally MWF but since had HD yest will plan HD sat and then get back on MWF schedule Mon.Marland Kitchen MATTINGLY,MICHAEL T,MD 04/14/2015 2:31 PM

## 2015-04-14 NOTE — Anesthesia Postprocedure Evaluation (Signed)
  Anesthesia Post-op Note  Patient: [name]  Procedure(s) Performed: Procedure(s): LEFT BELOW THE KNEE AMPUTATION  (Left)  Patient Location: PACU  Anesthesia Type:General  Level of Consciousness: awake, alert  and oriented  Airway and Oxygen Therapy: Patient Spontanous Breathing and Patient connected to nasal cannula oxygen  Post-op Pain: mild  Post-op Assessment: Post-op Vital signs reviewed, Patient's Cardiovascular Status Stable, Respiratory Function Stable, Patent Airway and Pain level controlled              Post-op Vital Signs: stable  Last Vitals:  Filed Vitals:   04/14/15 1646  BP: 161/75  Pulse: 95  Temp: 36.6 C  Resp: 16    Complications: No apparent anesthesia complications

## 2015-04-14 NOTE — Anesthesia Preprocedure Evaluation (Addendum)
Anesthesia Evaluation  Patient identified by MRN, date of birth, ID band Patient awake    Reviewed: Allergy & Precautions, NPO status , Patient's Chart, lab work & pertinent test results  Airway Mallampati: II  TM Distance: >3 FB Neck ROM: Full    Dental  (+) Partial Lower, Partial Upper   Pulmonary former smoker,     + decreased breath sounds      Cardiovascular hypertension,  Rhythm:Regular     Neuro/Psych    GI/Hepatic   Endo/Other  diabetes  Renal/GU      Musculoskeletal   Abdominal (+) + obese,   Peds  Hematology   Anesthesia Other Findings   Reproductive/Obstetrics                            Anesthesia Physical Anesthesia Plan  ASA: III  Anesthesia Plan: General   Post-op Pain Management:    Induction: Intravenous  Airway Management Planned: LMA  Additional Equipment:   Intra-op Plan:   Post-operative Plan:   Informed Consent: I have reviewed the patients History and Physical, chart, labs and discussed the procedure including the risks, benefits and alternatives for the proposed anesthesia with the patient or authorized representative who has indicated his/her understanding and acceptance.     Plan Discussed with: CRNA and Anesthesiologist  Anesthesia Plan Comments:         Anesthesia Quick Evaluation

## 2015-04-14 NOTE — H&P (Signed)
[name] is an 49 y.o. male.   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  At this time patient does not have a stable foot for ambulation he has dehiscence of the first and second Ray amputation and  presents at this time for transtibial amputation.  Past Medical History  Diagnosis Date  . Hypertension   . Chest pain   . Morbid obesity   . Bronchitis   . Blood transfusion   . H/O hiatal hernia   . Chronic kidney disease     dialysis, [facility]F, [facility]  . GERD (gastroesophageal reflux disease)   . Dysrhythmia     irregular heartbeat  . Hyperparathyroidism   . ESRD (end stage renal disease) on dialysis     first HD 06/19/04  . Pneumonia   . Diabetes mellitus     type 2  . Anemia     Past Surgical History  Procedure Laterality Date  . Av fistula placement  10/11/2010  . Ankle fusion      rt foot charcot joint 2008 with refusion  . Av fistula placement  07/02/2011    Procedure: INSERTION OF ARTERIOVENOUS (AV) GORE-TEX GRAFT ARM;  Surgeon: Elam Dutch, MD;  Location: [place];  Service: Vascular;  Laterality: Left;  . Thrombectomy    . Insertion of dialysis catheter  01/20/2012    Procedure: INSERTION OF DIALYSIS CATHETER;  Surgeon: Serafina Mitchell, MD;  Location: [place];  Service: Vascular;  Laterality: N/A;  insertion left internal jugular dialysis catheter 23cm  . Umbilcial hernia    . Parathyroidectomy    . Avgg removal Right 11/06/2012    Procedure: REMOVAL OF ARTERIOVENOUS GORETEX GRAFT ([place]);  Surgeon: Mal Misty, MD;  Location: [place];  Service: Vascular;  Laterality: Right;  . Av fistula placement Right 11/06/2012    Procedure: INSERTION OF ARTERIOVENOUS (AV) GORE-TEX GRAFT ARM;  Surgeon: Mal Misty, MD;  Location: [place];  Service: Vascular;  Laterality: Right;  . Insertion of dialysis catheter Right  11/09/2012    Procedure: INSERTION OF DIALYSIS CATHETER;  Surgeon: Conrad [place], MD;  Location: [place];  Service: Vascular;  Laterality: Right;  Right  Femoral Vein  . Venogram N/A 01/06/2014    Procedure: VENOGRAM;  Surgeon: Conrad [place], MD;  Location: [facility] CATH LAB;  Service: Cardiovascular;  Laterality: N/A;  . Amputation Left 01/20/2015    Procedure: Left Great Toe Amputation;  Surgeon: Newt Minion, MD;  Location: [place];  Service: Orthopedics;  Laterality: Left;  . Amputation Left 03/07/2015    Procedure: Left Foot 1st and 2nd Ray Amputation;  Surgeon: Newt Minion, MD;  Location: [place];  Service: Orthopedics;  Laterality: Left;    Family History  Problem Relation Age of Onset  . Heart disease Mother   . Kidney disease Mother   . Hypertension Mother    Social History:  reports that he quit smoking about 2 months ago. His smoking use included Cigarettes. He has a 2 pack-year smoking history. He has never used smokeless tobacco. He reports that he does not drink alcohol or use illicit drugs.  Allergies: No Known Allergies  No prescriptions prior to admission    No results found for this or any previous visit (from the past 48 hour(s)). No results found.  Review of Systems  All other systems reviewed  and are negative.   There were no vitals taken for this visit. Physical Exam  On examination patient has an unstable left foot and ankle and is status post limb salvage with dehiscence of the left foot first and second Ray amputation. Patient is unable to ambulate due to the instability and dehiscence of the wound.  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  Plan: We'll plan for a left transtibial amputation. Risks and benefits of surgery were discussed patient states he understands and wishes to proceed at this  time.  DUDA,MARCUS V 04/14/2015, 6:33 AM

## 2015-04-14 NOTE — Progress Notes (Signed)
Utilization review completed.  

## 2015-04-14 NOTE — Transfer of Care (Signed)
Immediate Anesthesia Transfer of Care Note  Patient: [name]  Procedure(s) Performed: Procedure(s): LEFT BELOW THE KNEE AMPUTATION  (Left)  Patient Location: PACU  Anesthesia Type:General  Level of Consciousness: awake, alert  and oriented  Airway & Oxygen Therapy: Patient Spontanous Breathing and Patient connected to face mask oxygen  Post-op Assessment: Report given to RN and Post -op Vital signs reviewed and stable  Post vital signs: Reviewed and stable  Last Vitals:  Filed Vitals:   04/14/15 1530  BP: 117/61  Pulse: 93  Temp: 36.3 C  Resp: 16    Complications: No apparent anesthesia complications

## 2015-04-14 NOTE — Op Note (Signed)
   Date of Surgery: 04/14/2015  INDICATIONS: [name] is a 49 y.o.-year-old male who has undergone limb salvage intervention with multiple ray amputations. Patient has had progressive dehiscence of his left foot and presents at this time for transtibial amputation.Marland Kitchen  PREOPERATIVE DIAGNOSIS: Left foot dehiscence status post limb salvage intervention  POSTOPERATIVE DIAGNOSIS: Same.  PROCEDURE: Transtibial amputation  SURGEON: Sharol Given, M.D.  ANESTHESIA:  general  IV FLUIDS AND URINE: See anesthesia.  ESTIMATED BLOOD LOSS: Minimal mL.  COMPLICATIONS: None.  DESCRIPTION OF PROCEDURE: The patient was brought to the operating room and underwent a general anesthetic. After adequate levels of anesthesia were obtained patient's lower extremity was prepped using DuraPrep draped into a sterile field. A timeout was called.  A transverse incision was made 11 cm distal to the tibial tubercle. This curved proximally and a large posterior flap was created. The tibia was transected 1 cm proximal to the skin incision. The fibula was transected just proximal to the tibial incision. The tibia was beveled anteriorly. A large posterior flap was created. The sciatic nerve was pulled cut and allowed to retract. The vascular bundles were suture ligated with 2-0 silk. The deep and superficial fascial layers were closed using #1 Vicryl. The skin was closed using staples and 2-0 nylon. The wound was covered with Adaptic orthopedic sponges AB dressing Kerlix and Coban. Patient was extubated taken to the PACU in stable condition.  Meridee Score, MD [place] 3:08 PM

## 2015-04-15 DIAGNOSIS — Z89512 Acquired absence of left leg below knee: Secondary | ICD-10-CM | POA: Insufficient documentation

## 2015-04-15 LAB — GLUCOSE, CAPILLARY
GLUCOSE-CAPILLARY: 74 mg/dL (ref 65–99)
GLUCOSE-CAPILLARY: 155 mg/dL — AB (ref 65–99)
GLUCOSE-CAPILLARY: 146 mg/dL — AB (ref 65–99)

## 2015-04-15 MED ORDER — HYDROMORPHONE HCL 1 MG/ML IJ SOLN
INTRAMUSCULAR | Status: AC
  Filled 2015-04-15: qty 1

## 2015-04-15 MED ORDER — DIPHENHYDRAMINE HCL 25 MG PO CAPS
25.0000 mg | ORAL_CAPSULE | Freq: Four times a day (QID) | ORAL | Status: DC | PRN
  Administered 2015-04-15: 25 mg via ORAL
  Filled 2015-04-15 (×2): qty 1

## 2015-04-15 MED ORDER — OXYCODONE HCL 5 MG PO TABS
ORAL_TABLET | ORAL | Status: AC
  Filled 2015-04-15: qty 1

## 2015-04-15 NOTE — Evaluation (Signed)
Physical Therapy Evaluation Patient Details Name: [name] MRN: [id number] DOB: 02/18/1966 Today's Date: 04/15/2015   History of Present Illness  pt admitted with nonhealing wound dehiscence of left ray amputations. Pt with DM, neuropathy, ESRD  Clinical Impression  [name] is moving well and very pleasant. Pt with Right AFO and support for charcot foot who is able to stand and hop with use of RW. Concern for continued detriment to RLE with constant impact of hopping on RLE. Pt with below deficits who will benefit from acute therapy to maximize transfers, gait and functional mobility. Recommend CIR to maximize independence and function prior to return home as wife is in the home but runs a daycare from home. Pt not overly excited about the idea of CIR but wife highly concerned about taking him home with stairs and lack of assist.     Follow Up Recommendations CIR    Equipment Recommendations  Wheelchair (measurements PT);Wheelchair cushion (measurements PT);Other (comment) (tub bench and amputee pad)    Recommendations for Other Services       Precautions / Restrictions Precautions Precautions: Fall Required Braces or Orthoses: Other Brace/Splint Other Brace/Splint: AFO RLE      Mobility  Bed Mobility Overal bed mobility: Modified Independent                Transfers Overall transfer level: Needs assistance   Transfers: Sit to/from Stand Sit to Stand: Min guard         General transfer comment: cues for hand placement and safety  Ambulation/Gait Ambulation/Gait assistance: Min guard Ambulation Distance (Feet): 10 Feet Assistive device: Rolling walker (2 wheeled) Gait Pattern/deviations: Step-to pattern   Gait velocity interpretation: Below normal speed for age/gender General Gait Details: cues for position in RW and safety  Stairs            Wheelchair Mobility    Modified Rankin (Stroke Patients Only)       Balance Overall balance  assessment: Needs assistance   Sitting balance-Leahy Scale: Good       Standing balance-Leahy Scale: Poor                               Pertinent Vitals/Pain Pain Assessment: 0-10 Pain Score: 4  Pain Location: LLe Pain Intervention(s): Repositioned;Monitored during session    Home Living Family/patient expects to be discharged to:: Private residence Living Arrangements: Spouse/significant other Available Help at Discharge: Available PRN/intermittently Type of Home: House Home Access: Stairs to enter   Technical brewer of Steps: 2 Home Layout: One level Home Equipment: Environmental consultant - 2 wheels;Crutches Additional Comments: wife has an in-home daycare and cannot provide 24hr assist    Prior Function Level of Independence: Independent with assistive device(s)         Comments: pt has been using crutches, hopping up steps on RLE and performing own ADLs     Hand Dominance        Extremity/Trunk Assessment   Upper Extremity Assessment: Overall WFL for tasks assessed           Lower Extremity Assessment: Overall WFL for tasks assessed      Cervical / Trunk Assessment: Normal  Communication   Communication: No difficulties  Cognition Arousal/Alertness: Awake/alert Behavior During Therapy: WFL for tasks assessed/performed Overall Cognitive Status: Within Functional Limits for tasks assessed  General Comments      Exercises Amputee Exercises Hip Extension: AROM;Sidelying;Left;10 reps Hip ABduction/ADduction: AROM;Seated;Left;10 reps Knee Extension: AROM;Seated;Left;10 reps      Assessment/Plan    PT Assessment Patient needs continued PT services  PT Diagnosis Difficulty walking   PT Problem List Decreased range of motion;Decreased activity tolerance;Decreased safety awareness;Decreased skin integrity;Decreased knowledge of use of DME;Obesity;Decreased balance;Decreased mobility  PT Treatment Interventions Gait  training;DME instruction;Stair training;Functional mobility training;Therapeutic activities;Therapeutic exercise;Balance training;Patient/family education   PT Goals (Current goals can be found in the Care Plan section) Acute Rehab PT Goals Patient Stated Goal: return home PT Goal Formulation: With patient Time For Goal Achievement: 04/29/15 Potential to Achieve Goals: Good    Frequency Min 4X/week   Barriers to discharge Decreased caregiver support      Co-evaluation               End of Session   Activity Tolerance: Patient tolerated treatment well Patient left: in bed;with call bell/phone within reach;with nursing/sitter in room;with family/visitor present Nurse Communication: Mobility status;Precautions         Time: [id number] PT Time Calculation (min) (ACUTE ONLY): 24 min   Charges:   PT Evaluation $Initial PT Evaluation Tier I: 1 Procedure PT Treatments $Therapeutic Activity: 8-22 mins   PT G CodesMelford Aase 04/15/2015, 1:32 PM  Elwyn Reach, [place]

## 2015-04-15 NOTE — Progress Notes (Addendum)
Patient back in the unit from HD and c/o itchiness after receiving dilaudid IV for pain. MD called and ordered benadryl 25mg  Q6hrs PRN for itching. Medicine given to patient. Will continue to monitor.

## 2015-04-15 NOTE — Progress Notes (Signed)
Pt stable - painok Stump dressing dry Dc this week

## 2015-04-15 NOTE — Progress Notes (Signed)
S:Pain better this AM.  Eating some O:BP 135/60 mmHg  Pulse 84  Temp(Src) 97.9 F (36.6 C) (Oral)  Resp 16  Ht 6\' 1"  (1.854 m)  Wt 117.935 kg (260 lb)  BMI 34.31 kg/m2  SpO2 95%  Intake/Output Summary (Last 24 hours) at 04/15/15 0738 Last data filed at 04/15/15 0600  Gross per 24 hour  Intake    360 ml  Output     60 ml  Net    300 ml   Weight change:  [id number] and alert CVS:RRR Resp:clear Abd:+ BS NTND Ext:No edema.  Lt BKA stump bandaged NEURO:CNI Ox3 no asterixis   . aspirin EC  325 mg Oral Daily  . calcitRIOL  2.5 mcg Oral Q M,W,F-HD  . calcium acetate  2,668 mg Oral TID WC  .  ceFAZolin (ANCEF) IV  2 g Intravenous Q6H  . [START ON 04/17/2015] darbepoetin (ARANESP) injection - DIALYSIS  150 mcg Intravenous Q Mon-HD  . docusate sodium  100 mg Oral BID  . [START ON 04/17/2015] ferric gluconate (FERRLECIT/NULECIT) IV  125 mg Intravenous Q M,W,F-HD  . ferric gluconate (FERRLECIT/NULECIT) IV  125 mg Intravenous Q Sat-HD  . insulin aspart  0-9 Units Subcutaneous TID WC  . insulin aspart  3 Units Subcutaneous TID WC  . insulin detemir  20 Units Subcutaneous QHS  . multivitamin  1 tablet Oral QHS  . repaglinide  0.5 mg Oral BID AC   No results found. BMET    Component Value Date/Time   NA 137 04/14/2015 1930   NA 135* 02/10/2014 1319   K 3.8 04/14/2015 1930   K 4.6 02/17/2014 1353   CL 93* 04/14/2015 1930   CL 99 02/10/2014 1319   CO2 32 04/14/2015 1930   CO2 25 02/10/2014 1319   GLUCOSE 172* 04/14/2015 1930   GLUCOSE 205* 02/10/2014 1319   BUN 31* 04/14/2015 1930   BUN 40* 02/10/2014 1319   CREATININE 8.06* 04/14/2015 1930   CREATININE 11.58* 02/10/2014 1319   CALCIUM 9.3 04/14/2015 1930   CALCIUM 8.4* 02/10/2014 1319   GFRNONAA 7* 04/14/2015 1930   GFRNONAA 5* 02/10/2014 1319   GFRAA 8* 04/14/2015 1930   GFRAA 5* 02/10/2014 1319   CBC    Component Value Date/Time   WBC 8.3 04/14/2015 1930   WBC 8.1 02/10/2014 1319   RBC 3.22* 04/14/2015 1930   RBC  3.86* 02/10/2014 1319   HGB 8.9* 04/14/2015 1930   HGB 11.4* 02/10/2014 1319   HCT 28.8* 04/14/2015 1930   HCT 36.3* 02/10/2014 1319   PLT 127* 04/14/2015 1930   PLT 140* 02/10/2014 1319   MCV 89.4 04/14/2015 1930   MCV 94 02/10/2014 1319   MCH 27.6 04/14/2015 1930   MCH 29.4 02/10/2014 1319   MCHC 30.9 04/14/2015 1930   MCHC 31.3* 02/10/2014 1319   RDW 16.8* 04/14/2015 1930   RDW 16.7* 02/10/2014 1319   LYMPHSABS 1.8 02/10/2014 1319   LYMPHSABS 1.5 11/06/2012 1407   MONOABS 0.8 02/10/2014 1319   MONOABS 0.8 11/06/2012 1407   EOSABS 0.6 02/10/2014 1319   EOSABS 0.2 11/06/2012 1407   BASOSABS 0.1 02/10/2014 1319   BASOSABS 0.0 11/06/2012 1407     Assessment: 1. SP Lt BKA 2. ESRD  MWF 3. Anemia on aranesp 4. Sec HPTH on calcitriol 5. HTN 6. DM  Plan: 1. HD today.  Shortened run as had HD thurs as well.  Normally MWF  Miko Markwood T

## 2015-04-16 ENCOUNTER — Encounter (HOSPITAL_COMMUNITY): Payer: Self-pay | Admitting: Orthopedic Surgery

## 2015-04-16 LAB — GLUCOSE, CAPILLARY
GLUCOSE-CAPILLARY: 83 mg/dL (ref 65–99)
GLUCOSE-CAPILLARY: 120 mg/dL — AB (ref 65–99)
GLUCOSE-CAPILLARY: 164 mg/dL — AB (ref 65–99)
Glucose-Capillary: 113 mg/dL — ABNORMAL HIGH (ref 65–99)

## 2015-04-16 NOTE — Progress Notes (Signed)
S: No new CO. Getting out of bed O:BP 111/51 mmHg  Pulse 90  Temp(Src) 98.4 F (36.9 C) (Oral)  Resp 16  Ht 6\' 1"  (1.854 m)  Wt 111 kg (244 lb 11.4 oz)  BMI 32.29 kg/m2  SpO2 98%  Intake/Output Summary (Last 24 hours) at 04/16/15 [id number] Last data filed at 04/15/15 1919  Gross per 24 hour  Intake    300 ml  Output   1500 ml  Net  -1200 ml   Weight change: -5.135 kg (-11 lb 5.1 oz) [id number] and alert CVS:RRR Resp:clear Abd:+ BS NTND Ext:No edema.  Lt BKA stump bandaged NEURO:CNI Ox3 no asterixis   . aspirin EC  325 mg Oral Daily  . calcitRIOL  2.5 mcg Oral Q M,W,F-HD  . calcium acetate  2,668 mg Oral TID WC  . [START ON 04/17/2015] darbepoetin (ARANESP) injection - DIALYSIS  150 mcg Intravenous Q Mon-HD  . docusate sodium  100 mg Oral BID  . [START ON 04/17/2015] ferric gluconate (FERRLECIT/NULECIT) IV  125 mg Intravenous Q M,W,F-HD  . insulin aspart  0-9 Units Subcutaneous TID WC  . insulin aspart  3 Units Subcutaneous TID WC  . insulin detemir  20 Units Subcutaneous QHS  . multivitamin  1 tablet Oral QHS  . repaglinide  0.5 mg Oral BID AC   No results found. BMET    Component Value Date/Time   NA 137 04/14/2015 1930   NA 135* 02/10/2014 1319   K 3.8 04/14/2015 1930   K 4.6 02/17/2014 1353   CL 93* 04/14/2015 1930   CL 99 02/10/2014 1319   CO2 32 04/14/2015 1930   CO2 25 02/10/2014 1319   GLUCOSE 172* 04/14/2015 1930   GLUCOSE 205* 02/10/2014 1319   BUN 31* 04/14/2015 1930   BUN 40* 02/10/2014 1319   CREATININE 8.06* 04/14/2015 1930   CREATININE 11.58* 02/10/2014 1319   CALCIUM 9.3 04/14/2015 1930   CALCIUM 8.4* 02/10/2014 1319   GFRNONAA 7* 04/14/2015 1930   GFRNONAA 5* 02/10/2014 1319   GFRAA 8* 04/14/2015 1930   GFRAA 5* 02/10/2014 1319   CBC    Component Value Date/Time   WBC 8.3 04/14/2015 1930   WBC 8.1 02/10/2014 1319   RBC 3.22* 04/14/2015 1930   RBC 3.86* 02/10/2014 1319   HGB 8.9* 04/14/2015 1930   HGB 11.4* 02/10/2014 1319   HCT 28.8*  04/14/2015 1930   HCT 36.3* 02/10/2014 1319   PLT 127* 04/14/2015 1930   PLT 140* 02/10/2014 1319   MCV 89.4 04/14/2015 1930   MCV 94 02/10/2014 1319   MCH 27.6 04/14/2015 1930   MCH 29.4 02/10/2014 1319   MCHC 30.9 04/14/2015 1930   MCHC 31.3* 02/10/2014 1319   RDW 16.8* 04/14/2015 1930   RDW 16.7* 02/10/2014 1319   LYMPHSABS 1.8 02/10/2014 1319   LYMPHSABS 1.5 11/06/2012 1407   MONOABS 0.8 02/10/2014 1319   MONOABS 0.8 11/06/2012 1407   EOSABS 0.6 02/10/2014 1319   EOSABS 0.2 11/06/2012 1407   BASOSABS 0.1 02/10/2014 1319   BASOSABS 0.0 11/06/2012 1407     Assessment: 1. SP Lt BKA 2. ESRD  MWF 3. Anemia on aranesp 4. Sec HPTH on calcitriol 5. HTN 6. DM  Plan: 1. HD tomorrow.  Will recheck labs at HD  2. ? If going to [place] T

## 2015-04-16 NOTE — Progress Notes (Signed)
Earlier, about 12:30, patient c/o nausea. Returned to room with PO Zofran and patient vomiting into trash can. Stated that was his lunch, "was not sitting well...", and stated he felt much better. Refused Zofran.

## 2015-04-16 NOTE — Progress Notes (Signed)
Pt stable Dressing ok PT this week

## 2015-04-17 ENCOUNTER — Inpatient Hospital Stay (HOSPITAL_COMMUNITY)
Admission: RE | Admit: 2015-04-17 | Discharge: 2015-04-26 | DRG: 559 | Disposition: A | Discharge status: Home Health | Payer: Medicare Other | Source: Intra-hospital | Attending: Physical Medicine & Rehabilitation | Admitting: Physical Medicine & Rehabilitation

## 2015-04-17 ENCOUNTER — Encounter (HOSPITAL_COMMUNITY): Payer: Self-pay | Admitting: *Deleted

## 2015-04-17 DIAGNOSIS — K5900 Constipation, unspecified: Secondary | ICD-10-CM | POA: Diagnosis present

## 2015-04-17 DIAGNOSIS — I120 Hypertensive chronic kidney disease with stage 5 chronic kidney disease or end stage renal disease: Secondary | ICD-10-CM | POA: Diagnosis present

## 2015-04-17 DIAGNOSIS — E213 Hyperparathyroidism, unspecified: Secondary | ICD-10-CM | POA: Diagnosis present

## 2015-04-17 DIAGNOSIS — E11610 Type 2 diabetes mellitus with diabetic neuropathic arthropathy: Secondary | ICD-10-CM | POA: Diagnosis present

## 2015-04-17 DIAGNOSIS — E1142 Type 2 diabetes mellitus with diabetic polyneuropathy: Secondary | ICD-10-CM | POA: Diagnosis present

## 2015-04-17 DIAGNOSIS — N2581 Secondary hyperparathyroidism of renal origin: Secondary | ICD-10-CM | POA: Diagnosis not present

## 2015-04-17 DIAGNOSIS — D62 Acute posthemorrhagic anemia: Secondary | ICD-10-CM | POA: Diagnosis not present

## 2015-04-17 DIAGNOSIS — Z89512 Acquired absence of left leg below knee: Secondary | ICD-10-CM | POA: Diagnosis not present

## 2015-04-17 DIAGNOSIS — E1122 Type 2 diabetes mellitus with diabetic chronic kidney disease: Secondary | ICD-10-CM | POA: Diagnosis present

## 2015-04-17 DIAGNOSIS — Z992 Dependence on renal dialysis: Secondary | ICD-10-CM

## 2015-04-17 DIAGNOSIS — R112 Nausea with vomiting, unspecified: Secondary | ICD-10-CM | POA: Diagnosis not present

## 2015-04-17 DIAGNOSIS — E1165 Type 2 diabetes mellitus with hyperglycemia: Secondary | ICD-10-CM | POA: Diagnosis not present

## 2015-04-17 DIAGNOSIS — D696 Thrombocytopenia, unspecified: Secondary | ICD-10-CM | POA: Diagnosis present

## 2015-04-17 DIAGNOSIS — D631 Anemia in chronic kidney disease: Secondary | ICD-10-CM | POA: Diagnosis not present

## 2015-04-17 DIAGNOSIS — E1101 Type 2 diabetes mellitus with hyperosmolarity with coma: Secondary | ICD-10-CM | POA: Diagnosis not present

## 2015-04-17 DIAGNOSIS — Z4781 Encounter for orthopedic aftercare following surgical amputation: Secondary | ICD-10-CM | POA: Diagnosis not present

## 2015-04-17 DIAGNOSIS — Z794 Long term (current) use of insulin: Secondary | ICD-10-CM | POA: Diagnosis not present

## 2015-04-17 DIAGNOSIS — E1129 Type 2 diabetes mellitus with other diabetic kidney complication: Secondary | ICD-10-CM | POA: Diagnosis not present

## 2015-04-17 DIAGNOSIS — K219 Gastro-esophageal reflux disease without esophagitis: Secondary | ICD-10-CM | POA: Diagnosis present

## 2015-04-17 DIAGNOSIS — Z87891 Personal history of nicotine dependence: Secondary | ICD-10-CM

## 2015-04-17 DIAGNOSIS — IMO0002 Reserved for concepts with insufficient information to code with codable children: Secondary | ICD-10-CM

## 2015-04-17 DIAGNOSIS — S88112S Complete traumatic amputation at level between knee and ankle, left lower leg, sequela: Secondary | ICD-10-CM | POA: Diagnosis not present

## 2015-04-17 DIAGNOSIS — N186 End stage renal disease: Secondary | ICD-10-CM | POA: Diagnosis not present

## 2015-04-17 DIAGNOSIS — E1140 Type 2 diabetes mellitus with diabetic neuropathy, unspecified: Secondary | ICD-10-CM | POA: Diagnosis not present

## 2015-04-17 LAB — RENAL FUNCTION PANEL
Albumin: 2.7 g/dL — ABNORMAL LOW (ref 3.5–5.0)
Anion gap: 15 (ref 5–15)
BUN: 55 mg/dL — AB (ref 6–20)
CHLORIDE: 92 mmol/L — AB (ref 101–111)
CO2: 30 mmol/L (ref 22–32)
Calcium: 9.4 mg/dL (ref 8.9–10.3)
Creatinine, Ser: 11.93 mg/dL — ABNORMAL HIGH (ref 0.61–1.24)
GFR calc Af Amer: 5 mL/min — ABNORMAL LOW (ref >60)
GFR, EST NON AFRICAN AMERICAN: 4 mL/min — AB (ref >60)
Glucose, Bld: 145 mg/dL — ABNORMAL HIGH (ref 65–99)
POTASSIUM: 4.2 mmol/L (ref 3.5–5.1)
Phosphorus: 9 mg/dL — ABNORMAL HIGH (ref 2.5–4.6)
Sodium: 137 mmol/L (ref 135–145)

## 2015-04-17 LAB — GLUCOSE, CAPILLARY
GLUCOSE-CAPILLARY: 115 mg/dL — AB (ref 65–99)
GLUCOSE-CAPILLARY: 230 mg/dL — AB (ref 65–99)
Glucose-Capillary: 63 mg/dL — ABNORMAL LOW (ref 65–99)
Glucose-Capillary: 79 mg/dL (ref 65–99)
Glucose-Capillary: 116 mg/dL — ABNORMAL HIGH (ref 65–99)

## 2015-04-17 LAB — CBC
HEMATOCRIT: 25.1 % — AB (ref 39.0–52.0)
Hemoglobin: 7.5 g/dL — ABNORMAL LOW (ref 13.0–17.0)
MCH: 26.6 pg (ref 26.0–34.0)
MCHC: 29.9 g/dL — AB (ref 30.0–36.0)
MCV: 89 fL (ref 78.0–100.0)
Platelets: 99 10*3/uL — ABNORMAL LOW (ref 150–400)
RBC: 2.82 MIL/uL — ABNORMAL LOW (ref 4.22–5.81)
RDW: 17.2 % — AB (ref 11.5–15.5)
WBC: 6.4 10*3/uL (ref 4.0–10.5)

## 2015-04-17 MED ORDER — DOCUSATE SODIUM 100 MG PO CAPS
100.0000 mg | ORAL_CAPSULE | Freq: Two times a day (BID) | ORAL | Status: DC
  Administered 2015-04-17 – 2015-04-25 (×11): 100 mg via ORAL
  Filled 2015-04-17 (×14): qty 1

## 2015-04-17 MED ORDER — ONDANSETRON HCL 4 MG PO TABS
4.0000 mg | ORAL_TABLET | Freq: Four times a day (QID) | ORAL | Status: DC | PRN
  Administered 2015-04-17 – 2015-04-21 (×3): 4 mg via ORAL
  Filled 2015-04-17 (×4): qty 1

## 2015-04-17 MED ORDER — SODIUM CHLORIDE 0.9 % IV SOLN
125.0000 mg | INTRAVENOUS | Status: DC
  Administered 2015-04-21 – 2015-04-24 (×2): 125 mg via INTRAVENOUS
  Filled 2015-04-17 (×7): qty 10

## 2015-04-17 MED ORDER — INSULIN ASPART 100 UNIT/ML ~~LOC~~ SOLN
3.0000 [IU] | Freq: Three times a day (TID) | SUBCUTANEOUS | Status: DC
  Administered 2015-04-22 – 2015-04-25 (×7): 3 [IU] via SUBCUTANEOUS

## 2015-04-17 MED ORDER — INSULIN ASPART 100 UNIT/ML ~~LOC~~ SOLN
0.0000 [IU] | Freq: Three times a day (TID) | SUBCUTANEOUS | Status: DC
  Administered 2015-04-20: 4 [IU] via SUBCUTANEOUS
  Administered 2015-04-22 – 2015-04-23 (×3): 1 [IU] via SUBCUTANEOUS
  Administered 2015-04-24: 2 [IU] via SUBCUTANEOUS
  Administered 2015-04-25: 3 [IU] via SUBCUTANEOUS

## 2015-04-17 MED ORDER — DARBEPOETIN ALFA 150 MCG/0.3ML IJ SOSY: 150.0000 ug | PREFILLED_SYRINGE | INTRAMUSCULAR | Status: DC

## 2015-04-17 MED ORDER — ONDANSETRON HCL 4 MG/2ML IJ SOLN
4.0000 mg | Freq: Four times a day (QID) | INTRAMUSCULAR | Status: DC | PRN
  Filled 2015-04-17: qty 2

## 2015-04-17 MED ORDER — REPAGLINIDE 0.5 MG PO TABS
0.5000 mg | ORAL_TABLET | Freq: Two times a day (BID) | ORAL | Status: DC
  Administered 2015-04-18 – 2015-04-23 (×9): 0.5 mg via ORAL
  Filled 2015-04-17 (×13): qty 1

## 2015-04-17 MED ORDER — ASPIRIN EC 325 MG PO TBEC
325.0000 mg | DELAYED_RELEASE_TABLET | Freq: Every day | ORAL | Status: DC
  Administered 2015-04-18 – 2015-04-25 (×7): 325 mg via ORAL
  Filled 2015-04-17 (×10): qty 1

## 2015-04-17 MED ORDER — ACETAMINOPHEN 325 MG PO TABS: 650.0000 mg | ORAL_TABLET | Freq: Four times a day (QID) | ORAL | Status: DC | PRN

## 2015-04-17 MED ORDER — INSULIN DETEMIR 100 UNIT/ML ~~LOC~~ SOLN
20.0000 [IU] | Freq: Every day | SUBCUTANEOUS | Status: DC
  Administered 2015-04-17 – 2015-04-25 (×7): 20 [IU] via SUBCUTANEOUS
  Filled 2015-04-17 (×11): qty 0.2

## 2015-04-17 MED ORDER — ACETAMINOPHEN 650 MG RE SUPP: 650.0000 mg | Freq: Four times a day (QID) | RECTAL | Status: DC | PRN

## 2015-04-17 MED ORDER — CALCITRIOL 0.5 MCG PO CAPS
2.5000 ug | ORAL_CAPSULE | ORAL | Status: DC
  Administered 2015-04-21 – 2015-04-26 (×3): 2.5 ug via ORAL
  Filled 2015-04-17 (×4): qty 5

## 2015-04-17 MED ORDER — OXYCODONE HCL 5 MG PO TABS
5.0000 mg | ORAL_TABLET | ORAL | Status: DC | PRN
  Administered 2015-04-25 – 2015-04-26 (×4): 10 mg via ORAL
  Filled 2015-04-17 (×4): qty 2

## 2015-04-17 MED ORDER — POLYETHYLENE GLYCOL 3350 17 G PO PACK: 17.0000 g | PACK | Freq: Every day | ORAL | Status: DC | PRN

## 2015-04-17 MED ORDER — CALCIUM ACETATE (PHOS BINDER) 667 MG PO CAPS
2668.0000 mg | ORAL_CAPSULE | Freq: Three times a day (TID) | ORAL | Status: DC
  Administered 2015-04-18: 2668 mg via ORAL
  Filled 2015-04-17 (×19): qty 4

## 2015-04-17 MED ORDER — DARBEPOETIN ALFA 150 MCG/0.3ML IJ SOSY
PREFILLED_SYRINGE | INTRAMUSCULAR | Status: AC
  Filled 2015-04-17: qty 0.3

## 2015-04-17 MED ORDER — SORBITOL 70 % SOLN
30.0000 mL | Freq: Every day | Status: DC | PRN
  Administered 2015-04-22: 30 mL via ORAL
  Filled 2015-04-17: qty 30

## 2015-04-17 MED ORDER — BISACODYL 5 MG PO TBEC
5.0000 mg | DELAYED_RELEASE_TABLET | Freq: Every day | ORAL | Status: DC | PRN
  Filled 2015-04-17: qty 1

## 2015-04-17 MED ORDER — ACETAMINOPHEN 325 MG PO TABS: 325.0000 mg | ORAL_TABLET | ORAL | Status: DC | PRN

## 2015-04-17 MED ORDER — METHOCARBAMOL 500 MG PO TABS: 500.0000 mg | ORAL_TABLET | Freq: Four times a day (QID) | ORAL | Status: DC | PRN

## 2015-04-17 NOTE — Progress Notes (Signed)
[name] Admission Coordinator Signed Physical Medicine and Rehabilitation PMR Pre-admission 04/17/2015 2:57 PM  Related encounter: Admission (Current) from 04/14/2015 in [place] Collapse All   PMR Admission Coordinator Pre-Admission Assessment  Patient: [name] is an 49 y.o., male MRN: [id number] DOB: 03-Feb-1966 Height: 6\' 1"  (185.4 cm) Weight: 111 kg (244 lb 11.4 oz) (standing scale)  Insurance Information HMO: PPO: PCP: IPA: 80/20: OTHER:  PRIMARY: Medicare A and B Policy#: [id number] Subscriber: self CM Name: Phone#: Fax#:  Pre-Cert#: Employer: disabled Benefits: Phone #: Name:  Eff. Date: 09/12/04 Deduct: $1288 Out of Pocket Max: Life Max:  CIR: 100% SNF: 100% first 20 days Outpatient: 80%/20% Co-Pay:  Home Health: 100% Co-Pay:  DME: 80% Co-Pay: 20% Providers: Pt. choice SECONDARY: Policy#: Subscriber:  CM Name: Phone#: Fax#:  Pre-Cert#: Employer:  Benefits: Phone #: Name:  Eff. 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 right handed male with  history of hypertension, morbid obesity, end-stage renal disease with hemodialysis, diabetes mellitus and peripheral neuropathy, right ankle fusion Charcot joint 2008, left first and second ray amputations July and August 2016. Lives with spouse independently prior to admission with occasional use of cane. He has 6 steps to enter his 1 story with home with b/l railings at front; 2 steps with left rail at back. Presented 04/14/2015 with left foot dehiscence and limb was not felt to be salvageable and underwent left transtibial amputation 04/14/2015 per Dr. Sharol Given. Hospital course pain management. Acute blood loss anemia 7.5-8.9 and monitored as patient continues on Aranesp. Hemodialysis ongoing as per renal services. Physical therapy evaluation completed 04/15/2015 with recommendations of physical medicine rehabilitation consult.. Patient was admitted for a comprehensive rehabilitation program      Past Medical History  Past Medical History  Diagnosis Date  . Hypertension   . Chest pain   . Morbid obesity ([place])   . Bronchitis   . Blood transfusion   . H/O hiatal hernia   . Chronic kidney disease     dialysis, [facility]W-F, [facility]  . GERD (gastroesophageal reflux disease)   . Dysrhythmia     irregular heartbeat  . Hyperparathyroidism ([place])   . ESRD (end stage renal disease) on dialysis ([place])     first HD 06/19/04  . Pneumonia   . Diabetes mellitus     type 2  . Anemia     Family History  family history includes Heart disease in his mother; Hypertension in his mother; Kidney disease in his mother.  Prior Rehab/Hospitalizations:  Has the patient had major surgery during 100 days prior to admission? Yes; pt. Has had 2 toe amputations, August and September  Current Medications   Current facility-administered medications:  . 0.9 % sodium chloride infusion, , Intravenous, Continuous, Meridee Score V, MD . acetaminophen (TYLENOL) tablet  650 mg, 650 mg, Oral, Q6H PRN, 650 mg at 04/15/15 1430 **OR** acetaminophen (TYLENOL) suppository 650 mg, 650 mg, Rectal, Q6H PRN, Newt Minion,  MD . aspirin EC tablet 325 mg, 325 mg, Oral, Daily, Meridee Score V, MD, 325 mg at 04/17/15 1359 . bisacodyl (DULCOLAX) EC tablet 5 mg, 5 mg, Oral, Daily PRN, Meridee Score V, MD, 5 mg at 04/16/15 2007 . calcitRIOL (ROCALTROL) capsule 2.5 mcg, 2.5 mcg, Oral, Q M,W,F-HD, Alric Seton, PA-C, 2.5 mcg at 04/17/15 1239 . calcium acetate (PHOSLO) capsule 2,668 mg, 2,668 mg, Oral, TID WC, Newt Minion, MD, 2,668 mg at 04/17/15 1401 . Darbepoetin Alfa (ARANESP) 150 MCG/0.3ML injection, , , ,  . Darbepoetin Alfa (ARANESP) injection 150 mcg, 150 mcg, Intravenous, Q Mon-HD, Alric Seton, PA-C, 150 mcg at 04/17/15 1301 . diphenhydrAMINE (BENADRYL) capsule 25 mg, 25 mg, Oral, Q6H PRN, Newt Minion, MD, 25 mg at 04/15/15 2208 . docusate sodium (COLACE) capsule 100 mg, 100 mg, Oral, BID, Meridee Score V, MD, 100 mg at 04/17/15 1359 . ferric gluconate (NULECIT) 125 mg in sodium chloride 0.9 % 100 mL IVPB, 125 mg, Intravenous, Q M,W,F-HD, Alric Seton, PA-C, 125 mg at 04/17/15 1215 . HYDROmorphone (DILAUDID) injection 1 mg, 1 mg, Intravenous, Q2H PRN, Meridee Score V, MD, 1 mg at 04/15/15 1830 . insulin aspart (novoLOG) injection 0-9 Units, 0-9 Units, Subcutaneous, TID WC, Newt Minion, MD, 2 Units at 04/14/15 1916 . insulin aspart (novoLOG) injection 3 Units, 3 Units, Subcutaneous, TID WC, Newt Minion, MD, 100 Units at 04/16/15 0843 . insulin detemir (LEVEMIR) injection 20 Units, 20 Units, Subcutaneous, QHS, Newt Minion, MD, 20 Units at 04/16/15 2143 . methocarbamol (ROBAXIN) tablet 500 mg, 500 mg, Oral, Q6H PRN, 500 mg at 04/16/15 1118 **OR** methocarbamol (ROBAXIN) 500 mg in dextrose 5 % 50 mL IVPB, 500 mg, Intravenous, Q6H PRN, Meridee Score V, MD . metoCLOPramide (REGLAN) tablet 5-10 mg, 5-10 mg, Oral, Q8H PRN **OR** metoCLOPramide (REGLAN) injection  5-10 mg, 5-10 mg, Intravenous, Q8H PRN, Meridee Score V, MD . multivitamin (RENA-VIT) tablet 1 tablet, 1 tablet, Oral, QHS, Newt Minion, MD, 1 tablet at 04/16/15 2142 . ondansetron (ZOFRAN) tablet 4 mg, 4 mg, Oral, Q6H PRN, 4 mg at 04/16/15 1836 **OR** ondansetron (ZOFRAN) injection 4 mg, 4 mg, Intravenous, Q6H PRN, Meridee Score V, MD . oxyCODONE (Oxy IR/ROXICODONE) immediate release tablet 5-10 mg, 5-10 mg, Oral, Q3H PRN, Meridee Score V, MD, 10 mg at 04/16/15 1610 . polyethylene glycol (MIRALAX / GLYCOLAX) packet 17 g, 17 g, Oral, Daily PRN, Meridee Score V, MD . repaglinide (PRANDIN) tablet 0.5 mg, 0.5 mg, Oral, BID AC, Meridee Score V, MD, 0.5 mg at 04/16/15 0841  Patients Current Diet: Diet renal with fluid restriction Fluid restriction:: 1200 mL Fluid; Room service appropriate?: Yes; Fluid consistency:: Thin  Precautions / Restrictions Precautions Precautions: Fall Other Brace/Splint: AFO RLE Restrictions Weight Bearing Restrictions: Yes LLE Weight Bearing: Non weight bearing   Has the patient had 2 or more falls or a fall with injury in the past year?No  Prior Activity Level Community (5-7x/wk): Pt. reports he was out of the house daily PTA. Says he was driving himself to HD on MWF even since his toe amputations in August and September. He says his friend will transport him to HD upon DC from rehab.   Home Assistive Devices / Equipment Home Assistive Devices/Equipment: CBG Meter, Crutches Home Equipment: Walker - 2 wheels, Crutches  Prior Device Use: Indicate devices/aids used by the patient prior to current illness, exacerbation or injury? crutches  Prior Functional Level Prior Function Level of Independence: Independent with assistive device(s) Comments: pt has been  using crutches, hopping up steps on RLE and performing own ADLs  Self Care: Did the patient need help bathing, dressing, using the toilet or eating? Independent  Indoor Mobility: Did the patient need  assistance with walking from room to room (with or without device)? Independent  Stairs: Did the patient need assistance with internal or external stairs (with or without device)? Independent; pt. Reports he used one rail and hopped up on right foot since he has been NWB on L LE  Functional Cognition: Did the patient need help planning regular tasks such as shopping or remembering to take medications? Independent  Current Functional Level Cognition  Overall Cognitive Status: Within Functional Limits for tasks assessed Orientation Level: Oriented X4   Extremity Assessment (includes Sensation/Coordination)  Upper Extremity Assessment: Overall WFL for tasks assessed  Lower Extremity Assessment: Overall WFL for tasks assessed    ADLs       Mobility  Overal bed mobility: Modified Independent    Transfers  Overall transfer level: Needs assistance Transfers: Sit to/from Stand Sit to Stand: Min guard General transfer comment: cues for hand placement and safety    Ambulation / Gait / Stairs / Wheelchair Mobility  Ambulation/Gait Ambulation/Gait assistance: Min guard Ambulation Distance (Feet): 10 Feet Assistive device: Rolling walker (2 wheeled) Gait Pattern/deviations: Step-to pattern General Gait Details: cues for position in RW and safety Gait velocity interpretation: Below normal speed for age/gender    Posture / Balance Balance Overall balance assessment: Needs assistance Sitting balance-Leahy Scale: Good Standing balance-Leahy Scale: Poor    Special needs/care consideration BiPAP/CPAP no CPM no Continuous Drip IV no Dialysis yes Days MWF Life Vest no Oxygen no Special Bed no Trach Size no Wound Vac (area) no  Skin Left BKA bandage intact and dry; L UE graft site bandaged  Bowel mgmt: last BM 04/16/15, walked to bathroom with assist Bladder mgmt: anuric Diabetic mgmt yes     Previous  Home Environment Living Arrangements: Spouse/significant other Lives With: Spouse, Daughter (21 year daughter lives in the home; works and goes to [employer]) Available Help at Discharge: Available PRN/intermittently (wife is in the home with 5 children she cares for) Type of Home: House Home Layout: One level Home Access: Stairs to enter [employer] of Steps: (at rear entrance, 6 at front entrance) Bathroom Shower/Tub: Tub/shower unit Home Care Services: Yes Type of Home Care Services: Home PT, Home RN ([facility]) [place] (if known): [place] Additional Comments: wife has an in-home daycare and cannot provide 24hr assist  Discharge Living Setting Plans for Discharge Living Setting: Patient's home Type of Home at Discharge: House Discharge Home Layout: One level Discharge Home Access: Stairs to enter Entrance Stairs-Rails: Left Entrance Stairs-Number of Steps: 2 (at back entrance; pt.uses 6 steps at front with crutch/rail) Discharge Bathroom Shower/Tub: Tub/shower unit Discharge Bathroom Toilet: Standard Discharge Bathroom Accessibility: Yes How Accessible: Accessible via walker Does the patient have any problems obtaining your medications?: No  Social/Family/Support Systems Patient Roles: Spouse, Parent Anticipated Caregiver: wife, April Maddy Anticipated Caregiver's Contact Information: [phone] home Ability/Limitations of Caregiver: wife has an in home day care: cares for 5 children. Pt. states she can supervise him as much as needed Caregiver Availability: Intermittent Discharge Plan Discussed with Primary Caregiver: No (pt. discussed with wife) Is Caregiver In Agreement with Plan?: Yes Does Caregiver/Family have Issues with Lodging/Transportation while Pt is in Rehab?: No   Goals/Additional Needs Patient/Family Goal for Rehab: modified independent and supervision with PT/OT; n/a SLP Expected length of stay: 10-14  days Cultural Considerations:  none Dietary Needs: renal diet with 1200 ml fluid restrictions, thin liquids Equipment Needs: TBA Pt/Family Agrees to Admission and willing to participate: Yes Program Orientation Provided & Reviewed with Pt/Caregiver Including Roles & Responsibilities: Yes   Decrease burden of Care through IP rehab admission: Other-no   Possible need for SNF placement upon discharge: Not anticipated   Patient Condition: This patient's condition remains as documented in the consult dated 04/17/15 , in which the Rehabilitation Physician determined and documented that the patient's condition is appropriate for intensive rehabilitative care in an inpatient rehabilitation facility. Will admit to inpatient rehab today.  Preadmission Screen Completed By: [name], 04/17/2015 2:58 PM ______________________________________________________________________  Discussed status with Dr. Posey Pronto on 04/17/15 at 1512 and received telephone approval for admission today.  Admission Coordinator: [name], time [id number] /Date 04/17/15          Cosigned by: Ankit Lorie Phenix, MD at 04/17/2015 3:18 PM  Revision History

## 2015-04-17 NOTE — H&P (Signed)
Physical Medicine and Rehabilitation Admission H&P    Chief complaint: Stump pain  HPI: [name] is a 49 y.o. right handed male with history of hypertension, morbid obesity, end-stage renal disease with hemodialysis, diabetes mellitus and peripheral neuropathy, right ankle fusion Charcot joint 2008, left first and second ray amputations July and August 2016. Lives with spouse independently prior to admission with occasional use of cane. He has 6 steps to enter his 1 story with home with b/l railings. Presented 04/14/2015 with left foot dehiscence and limb was not felt to be salvageable and underwent left transtibial amputation 04/14/2015 per Dr. Sharol Given. Hospital course pain management. Acute blood loss anemia 7.5-8.9 and monitored as patient continues on Aranesp. Hemodialysis ongoing as per renal services. Physical therapy evaluation completed 04/15/2015 with recommendations of physical medicine rehabilitation consult.. Patient was admitted for a comprehensive rehabilitation program  ROS Review of Systems  Constitutional: Negative for fever and chills.  HENT: Negative for hearing loss.  Eyes: Negative for blurred vision and double vision.  Respiratory: Negative for cough and shortness of breath.  Cardiovascular: Positive for leg swelling. Negative for chest pain.  Gastrointestinal: Positive for constipation. Negative for nausea and vomiting.   GERD  Genitourinary: Negative for dysuria and hematuria.  Musculoskeletal: Positive for myalgias, back pain and joint pain.  Neurological: Positive for weakness. Negative for headaches.  All other systems reviewed and are negative   Past Medical History  Diagnosis Date  . Hypertension   . Chest pain   . Morbid obesity ([place])   . Bronchitis   . Blood transfusion   . H/O hiatal hernia   . Chronic kidney disease     dialysis, M-W-F, [facility]  . GERD (gastroesophageal reflux disease)   . Dysrhythmia     irregular heartbeat  .  Hyperparathyroidism ([place])   . ESRD (end stage renal disease) on dialysis ([place])     first HD 06/19/04  . Pneumonia   . Diabetes mellitus     type 2  . Anemia    Past Surgical History  Procedure Laterality Date  . Av fistula placement  10/11/2010  . Ankle fusion      rt foot charcot joint 2008 with refusion  . Av fistula placement  07/02/2011    Procedure: INSERTION OF ARTERIOVENOUS (AV) GORE-TEX GRAFT ARM;  Surgeon: Elam Dutch, MD;  Location: [place];  Service: Vascular;  Laterality: Left;  . Thrombectomy    . Insertion of dialysis catheter  01/20/2012    Procedure: INSERTION OF DIALYSIS CATHETER;  Surgeon: Serafina Mitchell, MD;  Location: [place];  Service: Vascular;  Laterality: N/A;  insertion left internal jugular dialysis catheter 23cm  . Umbilcial hernia    . Parathyroidectomy    . Avgg removal Right 11/06/2012    Procedure: REMOVAL OF ARTERIOVENOUS GORETEX GRAFT ([place]);  Surgeon: Mal Misty, MD;  Location: [place];  Service: Vascular;  Laterality: Right;  . Av fistula placement Right 11/06/2012    Procedure: INSERTION OF ARTERIOVENOUS (AV) GORE-TEX GRAFT ARM;  Surgeon: Mal Misty, MD;  Location: [place];  Service: Vascular;  Laterality: Right;  . Insertion of dialysis catheter Right 11/09/2012    Procedure: INSERTION OF DIALYSIS CATHETER;  Surgeon: Conrad Stillmore, MD;  Location: [place];  Service: Vascular;  Laterality: Right;  Right  Femoral Vein  . Venogram N/A 01/06/2014    Procedure: VENOGRAM;  Surgeon: Conrad Leland Grove, MD;  Location: [facility] C[facility];  Service: Cardiovascular;  Laterality: N/A;  . Amputation Left 01/20/2015    Procedure: Left Great Toe Amputation;  Surgeon: Newt Minion, MD;  Location: [place];  Service: Orthopedics;  Laterality: Left;  . Amputation Left 03/07/2015    Procedure: Left Foot 1st and 2nd Ray Amputation;  Surgeon: Newt Minion, MD;  Location: [place];  Service: Orthopedics;  Laterality: Left;  . Amputation Left 04/14/2015    Procedure: LEFT BELOW THE KNEE  AMPUTATION ;  Surgeon: Newt Minion, MD;  Location: [place];  Service: Orthopedics;  Laterality: Left;   Family History  Problem Relation Age of Onset  . Heart disease Mother   . Kidney disease Mother   . Hypertension Mother    Social History:  reports that he quit smoking about 3 months ago. His smoking use included Cigarettes. He has a 2 pack-year smoking history. He has never used smokeless tobacco. He reports that he does not drink alcohol or use illicit drugs. Allergies: No Known Allergies Medications Prior to Admission  Medication Sig Dispense Refill  . b complex-vitamin c-folic acid (NEPHRO-VITE) 0.8 MG TABS Take 0.8 mg by mouth at bedtime.      . BD PEN NEEDLE NANO U/F 32G X 4 MM MISC     . calcium acetate (PHOSLO) 667 MG capsule Take 2,668 mg by mouth 3 (three) times daily with meals.    Marland Kitchen LEVEMIR FLEXTOUCH 100 UNIT/ML Pen Inject 35 Units into the skin 3 (three) times daily as needed. Per Sliding Scale    . oxyCODONE-acetaminophen (PERCOCET) 10-325 MG per tablet Take 1-2 tablets by mouth every 4 (four) hours as needed for pain. 60 tablet 0  . repaglinide (PRANDIN) 0.5 MG tablet Take 0.5 mg by mouth 2 (two) times daily before a meal.       Home: Home Living Family/patient expects to be discharged to:: Private residence Living Arrangements: Spouse/significant other Available Help at Discharge: Available PRN/intermittently Type of Home: House Home Access: Stairs to enter Technical brewer of Steps: 2 Home Layout: One level Home Equipment: Environmental consultant - 2 wheels, Crutches Additional Comments: wife has an in-home daycare and cannot provide 24hr assist   Functional History: Prior Function Level of Independence: Independent with assistive device(s) Comments: pt has been using crutches, hopping up steps on RLE and performing own ADLs  Functional Status:  Mobility: Bed Mobility Overal bed mobility: Modified Independent Transfers Overall transfer level: Needs  assistance Transfers: Sit to/from Stand Sit to Stand: Min guard General transfer comment: cues for hand placement and safety Ambulation/Gait Ambulation/Gait assistance: Min guard Ambulation Distance (Feet): 10 Feet Assistive device: Rolling walker (2 wheeled) Gait Pattern/deviations: Step-to pattern General Gait Details: cues for position in RW and safety Gait velocity interpretation: Below normal speed for age/gender    ADL:    Cognition: Cognition Overall Cognitive Status: Within Functional Limits for tasks assessed Orientation Level: Oriented X4 Cognition Arousal/Alertness: Awake/alert Behavior During Therapy: WFL for tasks assessed/performed Overall Cognitive Status: Within Functional Limits for tasks assessed  Physical Exam: Blood pressure 95/35, pulse 102, temperature 98.9 F (37.2 C), temperature source Oral, resp. rate 18, height _0  (1.854 m), weight 114.1 kg (251 lb 8.7 oz), SpO2 90 %. Physical Exam Constitutional: He is oriented to person, place, and time. He appears well-developed and well-nourished.  49 year old African-American male  HENT:  Head: Normocephalic and atraumatic.  Eyes: Conjunctivae and EOM are normal.  Neck: Normal range of motion. Neck supple. No thyromegaly present.  Cardiovascular: Normal rate and regular rhythm.  Respiratory: Effort  normal and breath sounds normal.  GI: Soft. Bowel sounds are normal. He exhibits no distension.  Musculoskeletal: Normal range of motion.  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  Diminished sensation right foot  Skin: Skin is warm and dry.  Left BKA site is dressed appropriately tender.  Psychiatric: He has a normal mood and affect. His behavior is normal    Results for orders placed or performed during the hospital encounter of 04/14/15 (from the past 48 hour(s))  Glucose, capillary     Status: Abnormal   Collection  Time: 04/15/15  8:36 PM  Result Value Ref Range   Glucose-Capillary 146 (H) 65 - 99 mg/dL  Glucose, capillary     Status: None   Collection Time: 04/16/15  6:20 AM  Result Value Ref Range   Glucose-Capillary 83 65 - 99 mg/dL  Glucose, capillary     Status: Abnormal   Collection Time: 04/16/15 11:44 AM  Result Value Ref Range   Glucose-Capillary 120 (H) 65 - 99 mg/dL  Glucose, capillary     Status: Abnormal   Collection Time: 04/16/15  4:17 PM  Result Value Ref Range   Glucose-Capillary 113 (H) 65 - 99 mg/dL  Glucose, capillary     Status: Abnormal   Collection Time: 04/16/15  7:50 PM  Result Value Ref Range   Glucose-Capillary 164 (H) 65 - 99 mg/dL  Glucose, capillary     Status: Abnormal   Collection Time: 04/17/15  6:52 AM  Result Value Ref Range   Glucose-Capillary 63 (L) 65 - 99 mg/dL  CBC     Status: Abnormal   Collection Time: 04/17/15  8:49 AM  Result Value Ref Range   WBC 6.4 4.0 - 10.5 K/uL    Comment: REPEATED TO VERIFY   RBC 2.82 (L) 4.22 - 5.81 MIL/uL   Hemoglobin 7.5 (L) 13.0 - 17.0 g/dL    Comment: SPECIMEN CHECKED FOR CLOTS REPEATED TO VERIFY    HCT 25.1 (L) 39.0 - 52.0 %   MCV 89.0 78.0 - 100.0 fL   MCH 26.6 26.0 - 34.0 pg   MCHC 29.9 (L) 30.0 - 36.0 g/dL   RDW 17.2 (H) 11.5 - 15.5 %   Platelets 99 (L) 150 - 400 K/uL    Comment: PLATELET COUNT CONFIRMED BY SMEAR REPEATED TO VERIFY   Renal function panel     Status: Abnormal   Collection Time: 04/17/15  8:49 AM  Result Value Ref Range   Sodium 137 135 - 145 mmol/L   Potassium 4.2 3.5 - 5.1 mmol/L   Chloride 92 (L) 101 - 111 mmol/L   CO2 30 22 - 32 mmol/L   Glucose, Bld 145 (H) 65 - 99 mg/dL   BUN 55 (H) 6 - 20 mg/dL   Creatinine, Ser 11.93 (H) 0.61 - 1.24 mg/dL   Calcium 9.4 8.9 - 10.3 mg/dL   Phosphorus 9.0 (H) 2.5 - 4.6 mg/dL   Albumin 2.7 (L) 3.5 - 5.0 g/dL   GFR calc non Af Amer 4 (L) >60 mL/min   GFR calc Af Amer 5 (L) >60 mL/min    Comment: (NOTE) The eGFR has been calculated using the  CKD EPI equation. This calculation has not been validated in all clinical situations. eGFR's persistently <60 mL/min signify possible Chronic Kidney Disease.    Anion gap 15 5 - 15   No results found.     Medical  Problem List and Plan: 1. Functional deficits secondary to left BKA secondary nonhealing ulcer 04/14/2015 2.  DVT Prophylaxis/Anticoagulation: SCDs right lower extremity. Monitor for any signs of DVT 3. Pain Management: Oxycodone and Robaxin as needed. Monitor with increased mobility 4. End-stage renal disease with hemodialysis. Follow-up per renal services 5. Neuropsych: This patient is capable of making decisions on his own behalf. 6. Skin/Wound Care: Routine skin checks 7. Fluids/Electrolytes/Nutrition: Routine I&O with follow-up chemistries 8. Acute on chronic anemia. Continue Aranesp. Follow-up CBC 9. Diabetes mellitus with peripheral neuropathy. Levemir 20 units daily, NovoLog 3 units 3 times a day, Prandin 0.5 mg twice a day. Sliding scale insulin. Check blood sugars before meals and at bedtime. Diabetic teaching 10. Hypertension. Monitor with increased mobility. No current anti-hypertensive medication 11. Charcot right foot. Status post ankle fusion 2008  Post Admission Physician Evaluation: 1. Functional deficits secondary  to left BKA secondary nonhealing ulcer 04/14/2015. 2. Patient is admitted to receive collaborative, interdisciplinary care between the physiatrist, rehab nursing staff, and therapy team. 3. Patient's level of medical complexity and substantial therapy needs in context of that medical necessity cannot be provided at a lesser intensity of care such as a SNF. 4. Patient has experienced substantial functional loss from his/her baseline which was documented above under the "Functional History" and "Functional Status" headings.  Judging by the patient's diagnosis, physical exam, and functional history, the patient has potential for functional progress  which will result in measurable gains while on inpatient rehab.  These gains will be of substantial and practical use upon discharge  in facilitating mobility and self-care at the household level. 5. Physiatrist will provide 24 hour management of medical needs as well as oversight of the therapy plan/treatment and provide guidance as appropriate regarding the interaction of the two. 6. 24 hour rehab nursing will assist with safety, skin/wound care, medication administration, pain management and patient education  and help integrate therapy concepts, techniques,education, etc. 7. PT will assess and treat for/with: Lower extremity strength, stump protection, range of motion, stamina, balance, functional mobility, safety, adaptive techniques and equipment, NMR, pain control, family education, ego support. Goals are: supervision/mod I. 8. OT will assess and treat for/with: ADL's, functional mobility, safety, adaptive techniques and equipment, NMR, stump hygiene, ego support. Goals are: supervision. Therapy may not proceed with showering this patient. 9. Case Management and Social Worker will assess and treat for psychological issues and discharge planning. 10. Team conference will be held weekly to assess progress toward goals and to determine barriers to discharge. 11. Patient will receive at least 3 hours of therapy per day at least 5 days per week. 12. ELOS: 10-14 days 13. Prognosis:  excellent   Delice Lesch, MD  04/17/2015

## 2015-04-17 NOTE — H&P (View-Only) (Signed)
Physical Medicine and Rehabilitation Admission H&P    Chief complaint: Stump pain  HPI: [name] is a 49 y.o. right handed male with history of hypertension, morbid obesity, end-stage renal disease with hemodialysis, diabetes mellitus and peripheral neuropathy, right ankle fusion Charcot joint 2008, left first and second ray amputations July and August 2016. Lives with spouse independently prior to admission with occasional use of cane. He has 6 steps to enter his 1 story with home with b/l railings. Presented 04/14/2015 with left foot dehiscence and limb was not felt to be salvageable and underwent left transtibial amputation 04/14/2015 per Dr. Sharol Given. Hospital course pain management. Acute blood loss anemia 7.5-8.9 and monitored as patient continues on Aranesp. Hemodialysis ongoing as per renal services. Physical therapy evaluation completed 04/15/2015 with recommendations of physical medicine rehabilitation consult.. Patient was admitted for a comprehensive rehabilitation program  ROS Review of Systems  Constitutional: Negative for fever and chills.  HENT: Negative for hearing loss.  Eyes: Negative for blurred vision and double vision.  Respiratory: Negative for cough and shortness of breath.  Cardiovascular: Positive for leg swelling. Negative for chest pain.  Gastrointestinal: Positive for constipation. Negative for nausea and vomiting.   GERD  Genitourinary: Negative for dysuria and hematuria.  Musculoskeletal: Positive for myalgias, back pain and joint pain.  Neurological: Positive for weakness. Negative for headaches.  All other systems reviewed and are negative   Past Medical History  Diagnosis Date  . Hypertension   . Chest pain   . Morbid obesity ([place])   . Bronchitis   . Blood transfusion   . H/O hiatal hernia   . Chronic kidney disease     dialysis, M-W-F, [facility]  . GERD (gastroesophageal reflux disease)   . Dysrhythmia     irregular heartbeat  .  Hyperparathyroidism ([place])   . ESRD (end stage renal disease) on dialysis ([place])     first HD 06/19/04  . Pneumonia   . Diabetes mellitus     type 2  . Anemia    Past Surgical History  Procedure Laterality Date  . Av fistula placement  10/11/2010  . Ankle fusion      rt foot charcot joint 2008 with refusion  . Av fistula placement  07/02/2011    Procedure: INSERTION OF ARTERIOVENOUS (AV) GORE-TEX GRAFT ARM;  Surgeon: Elam Dutch, MD;  Location: [place];  Service: Vascular;  Laterality: Left;  . Thrombectomy    . Insertion of dialysis catheter  01/20/2012    Procedure: INSERTION OF DIALYSIS CATHETER;  Surgeon: Serafina Mitchell, MD;  Location: [place];  Service: Vascular;  Laterality: N/A;  insertion left internal jugular dialysis catheter 23cm  . Umbilcial hernia    . Parathyroidectomy    . Avgg removal Right 11/06/2012    Procedure: REMOVAL OF ARTERIOVENOUS GORETEX GRAFT ([place]);  Surgeon: Mal Misty, MD;  Location: [place];  Service: Vascular;  Laterality: Right;  . Av fistula placement Right 11/06/2012    Procedure: INSERTION OF ARTERIOVENOUS (AV) GORE-TEX GRAFT ARM;  Surgeon: Mal Misty, MD;  Location: [place];  Service: Vascular;  Laterality: Right;  . Insertion of dialysis catheter Right 11/09/2012    Procedure: INSERTION OF DIALYSIS CATHETER;  Surgeon: Conrad Stillmore, MD;  Location: [place];  Service: Vascular;  Laterality: Right;  Right  Femoral Vein  . Venogram N/A 01/06/2014    Procedure: VENOGRAM;  Surgeon: Conrad Leland Grove, MD;  Location: [facility] C[facility];  Service: Cardiovascular;  Laterality: N/A;  . Amputation Left 01/20/2015    Procedure: Left Great Toe Amputation;  Surgeon: Newt Minion, MD;  Location: [place];  Service: Orthopedics;  Laterality: Left;  . Amputation Left 03/07/2015    Procedure: Left Foot 1st and 2nd Ray Amputation;  Surgeon: Newt Minion, MD;  Location: [place];  Service: Orthopedics;  Laterality: Left;  . Amputation Left 04/14/2015    Procedure: LEFT BELOW THE KNEE  AMPUTATION ;  Surgeon: Newt Minion, MD;  Location: [place];  Service: Orthopedics;  Laterality: Left;   Family History  Problem Relation Age of Onset  . Heart disease Mother   . Kidney disease Mother   . Hypertension Mother    Social History:  reports that he quit smoking about 3 months ago. His smoking use included Cigarettes. He has a 2 pack-year smoking history. He has never used smokeless tobacco. He reports that he does not drink alcohol or use illicit drugs. Allergies: No Known Allergies Medications Prior to Admission  Medication Sig Dispense Refill  . b complex-vitamin c-folic acid (NEPHRO-VITE) 0.8 MG TABS Take 0.8 mg by mouth at bedtime.      . BD PEN NEEDLE NANO U/F 32G X 4 MM MISC     . calcium acetate (PHOSLO) 667 MG capsule Take 2,668 mg by mouth 3 (three) times daily with meals.    Marland Kitchen LEVEMIR FLEXTOUCH 100 UNIT/ML Pen Inject 35 Units into the skin 3 (three) times daily as needed. Per Sliding Scale    . oxyCODONE-acetaminophen (PERCOCET) 10-325 MG per tablet Take 1-2 tablets by mouth every 4 (four) hours as needed for pain. 60 tablet 0  . repaglinide (PRANDIN) 0.5 MG tablet Take 0.5 mg by mouth 2 (two) times daily before a meal.       Home: Home Living Family/patient expects to be discharged to:: Private residence Living Arrangements: Spouse/significant other Available Help at Discharge: Available PRN/intermittently Type of Home: House Home Access: Stairs to enter Technical brewer of Steps: 2 Home Layout: One level Home Equipment: Environmental consultant - 2 wheels, Crutches Additional Comments: wife has an in-home daycare and cannot provide 24hr assist   Functional History: Prior Function Level of Independence: Independent with assistive device(s) Comments: pt has been using crutches, hopping up steps on RLE and performing own ADLs  Functional Status:  Mobility: Bed Mobility Overal bed mobility: Modified Independent Transfers Overall transfer level: Needs  assistance Transfers: Sit to/from Stand Sit to Stand: Min guard General transfer comment: cues for hand placement and safety Ambulation/Gait Ambulation/Gait assistance: Min guard Ambulation Distance (Feet): 10 Feet Assistive device: Rolling walker (2 wheeled) Gait Pattern/deviations: Step-to pattern General Gait Details: cues for position in RW and safety Gait velocity interpretation: Below normal speed for age/gender    ADL:    Cognition: Cognition Overall Cognitive Status: Within Functional Limits for tasks assessed Orientation Level: Oriented X4 Cognition Arousal/Alertness: Awake/alert Behavior During Therapy: WFL for tasks assessed/performed Overall Cognitive Status: Within Functional Limits for tasks assessed  Physical Exam: Blood pressure 95/35, pulse 102, temperature 98.9 F (37.2 C), temperature source Oral, resp. rate 18, height _0  (1.854 m), weight 114.1 kg (251 lb 8.7 oz), SpO2 90 %. Physical Exam Constitutional: He is oriented to person, place, and time. He appears well-developed and well-nourished.  49 year old African-American male  HENT:  Head: Normocephalic and atraumatic.  Eyes: Conjunctivae and EOM are normal.  Neck: Normal range of motion. Neck supple. No thyromegaly present.  Cardiovascular: Normal rate and regular rhythm.  Respiratory: Effort  normal and breath sounds normal.  GI: Soft. Bowel sounds are normal. He exhibits no distension.  Musculoskeletal: Normal range of motion.  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  Diminished sensation right foot  Skin: Skin is warm and dry.  Left BKA site is dressed appropriately tender.  Psychiatric: He has a normal mood and affect. His behavior is normal    Results for orders placed or performed during the hospital encounter of 04/14/15 (from the past 48 hour(s))  Glucose, capillary     Status: Abnormal   Collection  Time: 04/15/15  8:36 PM  Result Value Ref Range   Glucose-Capillary 146 (H) 65 - 99 mg/dL  Glucose, capillary     Status: None   Collection Time: 04/16/15  6:20 AM  Result Value Ref Range   Glucose-Capillary 83 65 - 99 mg/dL  Glucose, capillary     Status: Abnormal   Collection Time: 04/16/15 11:44 AM  Result Value Ref Range   Glucose-Capillary 120 (H) 65 - 99 mg/dL  Glucose, capillary     Status: Abnormal   Collection Time: 04/16/15  4:17 PM  Result Value Ref Range   Glucose-Capillary 113 (H) 65 - 99 mg/dL  Glucose, capillary     Status: Abnormal   Collection Time: 04/16/15  7:50 PM  Result Value Ref Range   Glucose-Capillary 164 (H) 65 - 99 mg/dL  Glucose, capillary     Status: Abnormal   Collection Time: 04/17/15  6:52 AM  Result Value Ref Range   Glucose-Capillary 63 (L) 65 - 99 mg/dL  CBC     Status: Abnormal   Collection Time: 04/17/15  8:49 AM  Result Value Ref Range   WBC 6.4 4.0 - 10.5 K/uL    Comment: REPEATED TO VERIFY   RBC 2.82 (L) 4.22 - 5.81 MIL/uL   Hemoglobin 7.5 (L) 13.0 - 17.0 g/dL    Comment: SPECIMEN CHECKED FOR CLOTS REPEATED TO VERIFY    HCT 25.1 (L) 39.0 - 52.0 %   MCV 89.0 78.0 - 100.0 fL   MCH 26.6 26.0 - 34.0 pg   MCHC 29.9 (L) 30.0 - 36.0 g/dL   RDW 17.2 (H) 11.5 - 15.5 %   Platelets 99 (L) 150 - 400 K/uL    Comment: PLATELET COUNT CONFIRMED BY SMEAR REPEATED TO VERIFY   Renal function panel     Status: Abnormal   Collection Time: 04/17/15  8:49 AM  Result Value Ref Range   Sodium 137 135 - 145 mmol/L   Potassium 4.2 3.5 - 5.1 mmol/L   Chloride 92 (L) 101 - 111 mmol/L   CO2 30 22 - 32 mmol/L   Glucose, Bld 145 (H) 65 - 99 mg/dL   BUN 55 (H) 6 - 20 mg/dL   Creatinine, Ser 11.93 (H) 0.61 - 1.24 mg/dL   Calcium 9.4 8.9 - 10.3 mg/dL   Phosphorus 9.0 (H) 2.5 - 4.6 mg/dL   Albumin 2.7 (L) 3.5 - 5.0 g/dL   GFR calc non Af Amer 4 (L) >60 mL/min   GFR calc Af Amer 5 (L) >60 mL/min    Comment: (NOTE) The eGFR has been calculated using the  CKD EPI equation. This calculation has not been validated in all clinical situations. eGFR's persistently <60 mL/min signify possible Chronic Kidney Disease.    Anion gap 15 5 - 15   No results found.     Medical  Problem List and Plan: 1. Functional deficits secondary to left BKA secondary nonhealing ulcer 04/14/2015 2.  DVT Prophylaxis/Anticoagulation: SCDs right lower extremity. Monitor for any signs of DVT 3. Pain Management: Oxycodone and Robaxin as needed. Monitor with increased mobility 4. End-stage renal disease with hemodialysis. Follow-up per renal services 5. Neuropsych: This patient is capable of making decisions on his own behalf. 6. Skin/Wound Care: Routine skin checks 7. Fluids/Electrolytes/Nutrition: Routine I&O with follow-up chemistries 8. Acute on chronic anemia. Continue Aranesp. Follow-up CBC 9. Diabetes mellitus with peripheral neuropathy. Levemir 20 units daily, NovoLog 3 units 3 times a day, Prandin 0.5 mg twice a day. Sliding scale insulin. Check blood sugars before meals and at bedtime. Diabetic teaching 10. Hypertension. Monitor with increased mobility. No current anti-hypertensive medication 11. Charcot right foot. Status post ankle fusion 2008  Post Admission Physician Evaluation: 1. Functional deficits secondary  to left BKA secondary nonhealing ulcer 04/14/2015. 2. Patient is admitted to receive collaborative, interdisciplinary care between the physiatrist, rehab nursing staff, and therapy team. 3. Patient's level of medical complexity and substantial therapy needs in context of that medical necessity cannot be provided at a lesser intensity of care such as a SNF. 4. Patient has experienced substantial functional loss from his/her baseline which was documented above under the "Functional History" and "Functional Status" headings.  Judging by the patient's diagnosis, physical exam, and functional history, the patient has potential for functional progress  which will result in measurable gains while on inpatient rehab.  These gains will be of substantial and practical use upon discharge  in facilitating mobility and self-care at the household level. 5. Physiatrist will provide 24 hour management of medical needs as well as oversight of the therapy plan/treatment and provide guidance as appropriate regarding the interaction of the two. 6. 24 hour rehab nursing will assist with safety, skin/wound care, medication administration, pain management and patient education  and help integrate therapy concepts, techniques,education, etc. 7. PT will assess and treat for/with: Lower extremity strength, stump protection, range of motion, stamina, balance, functional mobility, safety, adaptive techniques and equipment, NMR, pain control, family education, ego support. Goals are: supervision/mod I. 8. OT will assess and treat for/with: ADL's, functional mobility, safety, adaptive techniques and equipment, NMR, stump hygiene, ego support. Goals are: supervision. Therapy may not proceed with showering this patient. 9. Case Management and Social Worker will assess and treat for psychological issues and discharge planning. 10. Team conference will be held weekly to assess progress toward goals and to determine barriers to discharge. 11. Patient will receive at least 3 hours of therapy per day at least 5 days per week. 12. ELOS: 10-14 days 13. Prognosis:  excellent   Delice Lesch, MD  04/17/2015

## 2015-04-17 NOTE — Consult Note (Signed)
Physical Medicine and Rehabilitation Consult Reason for Consult: Left transtibial amputation Referring Physician: Dr. Sharol Given   HPI: [name] is a 49 y.o. right handed male with history of hypertension, morbid obesity, end-stage renal disease with hemodialysis, diabetes mellitus and peripheral neuropathy, right ankle fusion Charcot joint 2008, left first and second ray amputations July and August 2016. Lives with spouse independently prior to admission with occasional use of cane. He has 6 steps to enter his 1 story with home with b/l railings.  Presented 04/14/2015 with left foot dehiscence and limb was not felt to be salvageable and underwent left transtibial amputation 04/14/2015 per Dr. Sharol Given. Hospital course pain management. Hemodialysis ongoing as per renal services. Physical therapy evaluation completed 04/15/2015 with recommendations of physical medicine rehabilitation consult.   Review of Systems  Constitutional: Negative for fever and chills.  HENT: Negative for hearing loss.   Eyes: Negative for blurred vision and double vision.  Respiratory: Negative for cough and shortness of breath.   Cardiovascular: Positive for leg swelling. Negative for chest pain.  Gastrointestinal: Positive for constipation. Negative for nausea and vomiting.       GERD  Genitourinary: Negative for dysuria and hematuria.  Musculoskeletal: Positive for myalgias, back pain and joint pain.  Neurological: Positive for weakness. Negative for headaches.  All other systems reviewed and are negative.  Past Medical History  Diagnosis Date  . Hypertension   . Chest pain   . Morbid obesity ([place])   . Bronchitis   . Blood transfusion   . H/O hiatal hernia   . Chronic kidney disease     dialysis, M-W-F, [facility]  . GERD (gastroesophageal reflux disease)   . Dysrhythmia     irregular heartbeat  . Hyperparathyroidism ([place])   . ESRD (end stage renal disease) on dialysis ([place])     first HD 06/19/04    . Pneumonia   . Diabetes mellitus     type 2  . Anemia    Past Surgical History  Procedure Laterality Date  . Av fistula placement  10/11/2010  . Ankle fusion      rt foot charcot joint 2008 with refusion  . Av fistula placement  07/02/2011    Procedure: INSERTION OF ARTERIOVENOUS (AV) GORE-TEX GRAFT ARM;  Surgeon: Elam Dutch, MD;  Location: [place];  Service: Vascular;  Laterality: Left;  . Thrombectomy    . Insertion of dialysis catheter  01/20/2012    Procedure: INSERTION OF DIALYSIS CATHETER;  Surgeon: Serafina Mitchell, MD;  Location: [place];  Service: Vascular;  Laterality: N/A;  insertion left internal jugular dialysis catheter 23cm  . Umbilcial hernia    . Parathyroidectomy    . Avgg removal Right 11/06/2012    Procedure: REMOVAL OF ARTERIOVENOUS GORETEX GRAFT ([place]);  Surgeon: Mal Misty, MD;  Location: [place];  Service: Vascular;  Laterality: Right;  . Av fistula placement Right 11/06/2012    Procedure: INSERTION OF ARTERIOVENOUS (AV) GORE-TEX GRAFT ARM;  Surgeon: Mal Misty, MD;  Location: [place];  Service: Vascular;  Laterality: Right;  . Insertion of dialysis catheter Right 11/09/2012    Procedure: INSERTION OF DIALYSIS CATHETER;  Surgeon: Conrad Ridgeway, MD;  Location: [place];  Service: Vascular;  Laterality: Right;  Right  Femoral Vein  . Venogram N/A 01/06/2014    Procedure: VENOGRAM;  Surgeon: Conrad Hamblen, MD;  Location: [facility] CATH LAB;  Service: Cardiovascular;  Laterality: N/A;  . Amputation Left 01/20/2015  Procedure: Left Great Toe Amputation;  Surgeon: Newt Minion, MD;  Location: [place];  Service: Orthopedics;  Laterality: Left;  . Amputation Left 03/07/2015    Procedure: Left Foot 1st and 2nd Ray Amputation;  Surgeon: Newt Minion, MD;  Location: [place];  Service: Orthopedics;  Laterality: Left;  . Amputation Left 04/14/2015    Procedure: LEFT BELOW THE KNEE AMPUTATION ;  Surgeon: Newt Minion, MD;  Location: [place];  Service: Orthopedics;  Laterality: Left;    Family History  Problem Relation Age of Onset  . Heart disease Mother   . Kidney disease Mother   . Hypertension Mother    Social History:  reports that he quit smoking about 3 months ago. His smoking use included Cigarettes. He has a 2 pack-year smoking history. He has never used smokeless tobacco. He reports that he does not drink alcohol or use illicit drugs. Allergies: No Known Allergies Medications Prior to Admission  Medication Sig Dispense Refill  . b complex-vitamin c-folic acid (NEPHRO-VITE) 0.8 MG TABS Take 0.8 mg by mouth at bedtime.      . BD PEN NEEDLE NANO U/F 32G X 4 MM MISC     . calcium acetate (PHOSLO) 667 MG capsule Take 2,668 mg by mouth 3 (three) times daily with meals.    Marland Kitchen LEVEMIR FLEXTOUCH 100 UNIT/ML Pen Inject 35 Units into the skin 3 (three) times daily as needed. Per Sliding Scale    . oxyCODONE-acetaminophen (PERCOCET) 10-325 MG per tablet Take 1-2 tablets by mouth every 4 (four) hours as needed for pain. 60 tablet 0  . repaglinide (PRANDIN) 0.5 MG tablet Take 0.5 mg by mouth 2 (two) times daily before a meal.       Home: Home Living Family/patient expects to be discharged to:: Private residence Living Arrangements: Spouse/significant other Available Help at Discharge: Available PRN/intermittently Type of Home: House Home Access: Stairs to enter Technical brewer of Steps: 2 Home Layout: One level Home Equipment: Environmental consultant - 2 wheels, Crutches Additional Comments: wife has an in-home daycare and cannot provide 24hr assist  Functional History: Prior Function Level of Independence: Independent with assistive device(s) Comments: pt has been using crutches, hopping up steps on RLE and performing own ADLs Functional Status:  Mobility: Bed Mobility Overal bed mobility: Modified Independent Transfers Overall transfer level: Needs assistance Transfers: Sit to/from Stand Sit to Stand: Min guard General transfer comment: cues for hand placement and  safety Ambulation/Gait Ambulation/Gait assistance: Min guard Ambulation Distance (Feet): 10 Feet Assistive device: Rolling walker (2 wheeled) Gait Pattern/deviations: Step-to pattern General Gait Details: cues for position in RW and safety Gait velocity interpretation: Below normal speed for age/gender    ADL:    Cognition: Cognition Overall Cognitive Status: Within Functional Limits for tasks assessed Orientation Level: Oriented X4 Cognition Arousal/Alertness: Awake/alert Behavior During Therapy: WFL for tasks assessed/performed Overall Cognitive Status: Within Functional Limits for tasks assessed  Blood pressure 106/54, pulse 89, temperature 98.9 F (37.2 C), temperature source Oral, resp. rate 18, height 6\' 1"  (1.854 m), weight 114.1 kg (251 lb 8.7 oz), SpO2 90 %. Physical Exam  Vitals reviewed. Constitutional: He is oriented to person, place, and time. He appears well-developed and well-nourished.  49 year old African-American male  HENT:  Head: Normocephalic and atraumatic.  Eyes: Conjunctivae and EOM are normal.  Neck: Normal range of motion. Neck supple. No thyromegaly present.  Cardiovascular: Normal rate and regular rhythm.   Respiratory: Effort normal and breath sounds normal.  GI: Soft. Bowel sounds  are normal. He exhibits no distension.  Musculoskeletal: Normal range of motion.  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  Diminished sensation right foot  Skin: Skin is warm and dry.  Left BKA site is dressed appropriately tender.  Psychiatric: He has a normal mood and affect. His behavior is normal.    Results for orders placed or performed during the hospital encounter of 04/14/15 (from the past 24 hour(s))  Glucose, capillary     Status: Abnormal   Collection Time: 04/16/15 11:44 AM  Result Value Ref Range   Glucose-Capillary 120 (H) 65 - 99 mg/dL  Glucose, capillary      Status: Abnormal   Collection Time: 04/16/15  4:17 PM  Result Value Ref Range   Glucose-Capillary 113 (H) 65 - 99 mg/dL  Glucose, capillary     Status: Abnormal   Collection Time: 04/16/15  7:50 PM  Result Value Ref Range   Glucose-Capillary 164 (H) 65 - 99 mg/dL  Glucose, capillary     Status: Abnormal   Collection Time: 04/17/15  6:52 AM  Result Value Ref Range   Glucose-Capillary 63 (L) 65 - 99 mg/dL  CBC     Status: Abnormal   Collection Time: 04/17/15  8:49 AM  Result Value Ref Range   WBC 6.4 4.0 - 10.5 K/uL   RBC 2.82 (L) 4.22 - 5.81 MIL/uL   Hemoglobin 7.5 (L) 13.0 - 17.0 g/dL   HCT 25.1 (L) 39.0 - 52.0 %   MCV 89.0 78.0 - 100.0 fL   MCH 26.6 26.0 - 34.0 pg   MCHC 29.9 (L) 30.0 - 36.0 g/dL   RDW 17.2 (H) 11.5 - 15.5 %   Platelets 99 (L) 150 - 400 K/uL  Renal function panel     Status: Abnormal   Collection Time: 04/17/15  8:49 AM  Result Value Ref Range   Sodium 137 135 - 145 mmol/L   Potassium 4.2 3.5 - 5.1 mmol/L   Chloride 92 (L) 101 - 111 mmol/L   CO2 30 22 - 32 mmol/L   Glucose, Bld 145 (H) 65 - 99 mg/dL   BUN 55 (H) 6 - 20 mg/dL   Creatinine, Ser 11.93 (H) 0.61 - 1.24 mg/dL   Calcium 9.4 8.9 - 10.3 mg/dL   Phosphorus 9.0 (H) 2.5 - 4.6 mg/dL   Albumin 2.7 (L) 3.5 - 5.0 g/dL   GFR calc non Af Amer 4 (L) >60 mL/min   GFR calc Af Amer 5 (L) >60 mL/min   Anion gap 15 5 - 15   No results found.  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 Does the need for close, 24 hr/day medical supervision in concert with the patient's rehab needs make it unreasonable for this patient to be served in a less intensive setting? Yes 2. Co-Morbidities requiring supervision/potential complications: PVD, ESRD, Anemia 3. Due to safety, skin/wound care, disease management, medication administration, pain management and patient education, does the patient require 24 hr/day rehab nursing? Yes 4. Does the patient require coordinated care of a physician, rehab  nurse, PT (1-2 hrs/day, 5 days/week) and OT (1-2 hrs/day, 5 days/week) to address physical and functional deficits in the context of the above medical diagnosis(es)? Yes Addressing deficits in the following areas: balance, transferring, bowel/bladder control, bathing, toileting and psychosocial support 5. Can the patient actively participate in an intensive therapy program of at least 3 hrs of  therapy per day at least 5 days per week? Potentially 6. The potential for patient to make measurable gains while on inpatient rehab is excellent 7. Anticipated functional outcomes upon discharge from inpatient rehab are modified independent and supervision  with PT, modified independent and supervision with OT, n/a with SLP. 8. Estimated rehab length of stay to reach the above functional goals is: 10-14 days 9. Does the patient have adequate social supports and living environment to accommodate these discharge functional goals? Potentially 10. Anticipated D/C setting: Home 11. Anticipated post D/C treatments: HH therapy and Home excercise program 12. Overall Rehab/Functional Prognosis: excellent  RECOMMENDATIONS: This patient's condition is appropriate for continued rehabilitative care in the following setting: CIR Patient has agreed to participate in recommended program. Yes Note that insurance prior authorization may be required for reimbursement for recommended care.  Comment: Rehab Admissions Coordinator to follow up, with confirmation of access into/out of house if 24/7 support not available.    04/17/2015  ,

## 2015-04-17 NOTE — Discharge Summary (Signed)
Physician Discharge Summary  Patient ID: [name] MRN: [id number] DOB/AGE: July 30, 1965 49 y.o.  Admit date: 04/14/2015 Discharge date: 04/17/2015  Admission Diagnoses: Dehiscence left foot salvage intervention  Discharge Diagnoses:  Active Problems:   S/P BKA (below knee amputation) unilateral ([name])   Discharged Condition: stable  Hospital Course: Patient's hospital course was essentially unremarkable. He underwent a left transtibial amputation. Postoperatively patient progressed slowly with therapy and was felt to benefit from inpatient rehabilitation.  Consults: nephrology  Significant Diagnostic Studies: labs: Routine labs  Treatments: dialysis: Hemodialysis and surgery: See operative note  Discharge Exam: Blood pressure 110/56, pulse 88, temperature 98.1 F (36.7 C), temperature source Oral, resp. rate 18, height 6\' 1"  (1.854 m), weight 111 kg (244 lb 11.4 oz), SpO2 92 %. Incision/Wound: dressing clean and dry  Disposition: 01-Home or Self Care  Discharge Instructions    Call MD / Call 911    Complete by:  As directed   If you experience chest pain or shortness of breath, CALL 911 and be transported to the hospital emergency room.  If you develope a fever above 101 F, pus (white drainage) or increased drainage or redness at the wound, or calf pain, call your surgeon's office.     Constipation Prevention    Complete by:  As directed   Drink plenty of fluids.  Prune juice may be helpful.  You may use a stool softener, such as Colace (over the counter) 100 mg twice a day.  Use MiraLax (over the counter) for constipation as needed.     Diet - low sodium heart healthy    Complete by:  As directed      Increase activity slowly as tolerated    Complete by:  As directed             Medication List    TAKE these medications        b complex-vitamin c-folic acid 0.8 MG Tabs tablet  Take 0.8 mg by mouth at bedtime.     BD PEN NEEDLE NANO U/F 32G X 4 MM Misc   Generic drug:  Insulin Pen Needle     calcium acetate 667 MG capsule  Commonly known as:  PHOSLO  Take 2,668 mg by mouth 3 (three) times daily with meals.     LEVEMIR FLEXTOUCH 100 UNIT/ML Pen  Generic drug:  Insulin Detemir  Inject 35 Units into the skin 3 (three) times daily as needed. Per Sliding Scale     oxyCODONE-acetaminophen 10-325 MG tablet  Commonly known as:  PERCOCET  Take 1-2 tablets by mouth every 4 (four) hours as needed for pain.     repaglinide 0.5 MG tablet  Commonly known as:  PRANDIN  Take 0.5 mg by mouth 2 (two) times daily before a meal.           Follow-up Information    Follow up with Ricky Tadesse V, MD In 2 weeks.   Specialty:  Orthopedic Surgery   Contact information:   [place] [address] [phone]       Signed: Newt Jefferson 04/17/2015, 5:26 PM

## 2015-04-17 NOTE — Progress Notes (Signed)
Inpatient Rehabilitation  I met with the patient at the bedside to discuss his post acute rehab needs and provided him with information about IP Rehab.  I answered his questions and provided him with informational booklets.  He is in favor of the IP Rehab program and says his wife wants him to do it as well.    I spoke with Dr. Sharol Given who gives medical clearance for pt. to admit to rehab today.  He also states that he believes pt's hgb should just be watched but no further intervention at this time (hgb 7.5 today).  I have updated Ricki Miller CM and Pati Gallo, SW, as well as pt's RN Maudie Mercury.  I will arrange for admission later today.  Please call if questions.  [place] Admissions Coordinator Cell [phone] Office [phone]

## 2015-04-17 NOTE — PMR Pre-admission (Signed)
PMR Admission Coordinator Pre-Admission Assessment  Patient: [name] is an 49 y.o., male MRN: [id number] DOB: 07-06-1966 Height: 6\' 1"  (185.4 cm) Weight: 111 kg (244 lb 11.4 oz) (standing scale)              Insurance Information HMO:     PPO:      PCP:      IPA:      80/20:      OTHER:  PRIMARY:  Medicare A and B      Policy#: [id number]      Subscriber:  self CM Name:        Phone#:      Fax#:  Pre-Cert#:       Employer: disabled Benefits:  Phone #:      Name:  Eff. Date:  09/12/04     Deduct: $1288      Out of Pocket Max:        Life Max:   CIR:  100%      SNF:  100% first 20 days Outpatient:  80%/20%     Co-Pay:  Home Health:  100%      Co-Pay:   DME:  80%     Co-Pay:  20% Providers:  Pt. choice SECONDARY:       Policy#:       Subscriber:  CM Name:       Phone#:      Fax#:  Pre-Cert#:       Employer:  Benefits:  Phone #:      Name:  Eff. 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 right handed male with history of hypertension, morbid obesity, end-stage renal disease with hemodialysis, diabetes mellitus and peripheral neuropathy, right ankle fusion Charcot joint 2008, left first and second ray amputations July and August 2016. Lives with spouse independently prior to admission with occasional use of cane. He has 6 steps to enter his 1 story with home with b/l railings at front; 2 steps with left rail at back. Presented 04/14/2015  with left foot dehiscence and limb was not felt to be salvageable and underwent left transtibial amputation 04/14/2015 per Dr. Sharol Given. Hospital course pain management. Acute blood loss anemia 7.5-8.9 and monitored as patient continues on Aranesp. Hemodialysis ongoing as per renal services. Physical therapy evaluation completed 04/15/2015 with recommendations of physical medicine rehabilitation consult.. Patient was admitted for a comprehensive rehabilitation program      Past Medical History  Past Medical History  Diagnosis Date  . Hypertension   . Chest pain   . Morbid obesity ([place])   . Bronchitis   . Blood transfusion   . H/O hiatal hernia   . Chronic kidney disease  dialysis, M-W-F, [facility]  . GERD (gastroesophageal reflux disease)   . Dysrhythmia     irregular heartbeat  . Hyperparathyroidism ([place])   . ESRD (end stage renal disease) on dialysis ([place])     first HD 06/19/04  . Pneumonia   . Diabetes mellitus     type 2  . Anemia     Family History  family history includes Heart disease in his mother; Hypertension in his mother; Kidney disease in his mother.  Prior Rehab/Hospitalizations:  Has the patient had major surgery during 100 days prior to admission? Yes; pt. Has had 2 toe amputations, August and September  Current Medications   Current facility-administered medications:  .  0.9 %  sodium chloride infusion, , Intravenous, Continuous, Meridee Score V, MD .  acetaminophen (TYLENOL) tablet 650 mg, 650 mg, Oral, Q6H PRN, 650 mg at 04/15/15 1430 **OR** acetaminophen (TYLENOL) suppository 650 mg, 650 mg, Rectal, Q6H PRN, Meridee Score V, MD .  aspirin EC tablet 325 mg, 325 mg, Oral, Daily, Meridee Score V, MD, 325 mg at 04/17/15 1359 .  bisacodyl (DULCOLAX) EC tablet 5 mg, 5 mg, Oral, Daily PRN, Meridee Score V, MD, 5 mg at 04/16/15 2007 .  calcitRIOL (ROCALTROL) capsule 2.5 mcg, 2.5 mcg, Oral, Q M,W,F-HD, Alric Seton, PA-C, 2.5 mcg at 04/17/15 1239 .  calcium acetate (PHOSLO)  capsule 2,668 mg, 2,668 mg, Oral, TID WC, Newt Minion, MD, 2,668 mg at 04/17/15 1401 .  Darbepoetin Alfa (ARANESP) 150 MCG/0.3ML injection, , , ,  .  Darbepoetin Alfa (ARANESP) injection 150 mcg, 150 mcg, Intravenous, Q Mon-HD, Alric Seton, PA-C, 150 mcg at 04/17/15 1301 .  diphenhydrAMINE (BENADRYL) capsule 25 mg, 25 mg, Oral, Q6H PRN, Newt Minion, MD, 25 mg at 04/15/15 2208 .  docusate sodium (COLACE) capsule 100 mg, 100 mg, Oral, BID, Meridee Score V, MD, 100 mg at 04/17/15 1359 .  ferric gluconate (NULECIT) 125 mg in sodium chloride 0.9 % 100 mL IVPB, 125 mg, Intravenous, Q M,W,F-HD, Alric Seton, PA-C, 125 mg at 04/17/15 1215 .  HYDROmorphone (DILAUDID) injection 1 mg, 1 mg, Intravenous, Q2H PRN, Meridee Score V, MD, 1 mg at 04/15/15 1830 .  insulin aspart (novoLOG) injection 0-9 Units, 0-9 Units, Subcutaneous, TID WC, Newt Minion, MD, 2 Units at 04/14/15 1916 .  insulin aspart (novoLOG) injection 3 Units, 3 Units, Subcutaneous, TID WC, Newt Minion, MD, 100 Units at 04/16/15 0843 .  insulin detemir (LEVEMIR) injection 20 Units, 20 Units, Subcutaneous, QHS, Newt Minion, MD, 20 Units at 04/16/15 2143 .  methocarbamol (ROBAXIN) tablet 500 mg, 500 mg, Oral, Q6H PRN, 500 mg at 04/16/15 1118 **OR** methocarbamol (ROBAXIN) 500 mg in dextrose 5 % 50 mL IVPB, 500 mg, Intravenous, Q6H PRN, Meridee Score V, MD .  metoCLOPramide (REGLAN) tablet 5-10 mg, 5-10 mg, Oral, Q8H PRN **OR** metoCLOPramide (REGLAN) injection 5-10 mg, 5-10 mg, Intravenous, Q8H PRN, Meridee Score V, MD .  multivitamin (RENA-VIT) tablet 1 tablet, 1 tablet, Oral, QHS, Newt Minion, MD, 1 tablet at 04/16/15 2142 .  ondansetron (ZOFRAN) tablet 4 mg, 4 mg, Oral, Q6H PRN, 4 mg at 04/16/15 1836 **OR** ondansetron (ZOFRAN) injection 4 mg, 4 mg, Intravenous, Q6H PRN, Meridee Score V, MD .  oxyCODONE (Oxy IR/ROXICODONE) immediate release tablet 5-10 mg, 5-10 mg, Oral, Q3H PRN, Meridee Score V, MD, 10 mg at 04/16/15 1610 .  polyethylene  glycol (MIRALAX / GLYCOLAX) packet 17 g, 17 g, Oral, Daily PRN, Newt Minion, MD .  repaglinide (PRANDIN) tablet 0.5 mg, 0.5 mg, Oral, BID AC, Meridee Score V, MD, 0.5 mg at 04/16/15 0841  Patients Current Diet: Diet renal with fluid restriction Fluid restriction:: 1200 mL Fluid; Room service appropriate?: Yes; Fluid consistency:: Thin  Precautions / Restrictions Precautions Precautions: Fall Other Brace/Splint: AFO RLE Restrictions Weight Bearing Restrictions: Yes LLE Weight Bearing: Non weight bearing   Has the patient had 2 or more falls or a fall with injury in the past year?No  Prior Activity Level Community (5-7x/wk): Pt. reports he was out of the house daily PTA.  Says he was driving himself to HD on MWF even since his toe amputations in August and September.  He says his friend will transport him to HD upon DC from rehab.    Home Assistive Devices / Equipment Home Assistive Devices/Equipment: CBG Meter, Crutches Home Equipment: Walker - 2 wheels, Crutches  Prior Device Use: Indicate devices/aids used by the patient prior to current illness, exacerbation or injury? crutches  Prior Functional Level Prior Function Level of Independence: Independent with assistive device(s) Comments: pt has been using crutches, hopping up steps on RLE and performing own ADLs  Self Care: Did the patient need help bathing, dressing, using the toilet or eating?  Independent  Indoor Mobility: Did the patient need assistance with walking from room to room (with or without device)? Independent  Stairs: Did the patient need assistance with internal or external stairs (with or without device)? Independent; pt. Reports he used one rail and hopped up on right foot since he has been NWB on L LE  Functional Cognition: Did the patient need help planning regular tasks such as shopping or remembering to take medications? Independent  Current Functional Level Cognition  Overall Cognitive Status: Within  Functional Limits for tasks assessed Orientation Level: Oriented X4    Extremity Assessment (includes Sensation/Coordination)  Upper Extremity Assessment: Overall WFL for tasks assessed  Lower Extremity Assessment: Overall WFL for tasks assessed    ADLs       Mobility  Overal bed mobility: Modified Independent    Transfers  Overall transfer level: Needs assistance Transfers: Sit to/from Stand Sit to Stand: Min guard General transfer comment: cues for hand placement and safety    Ambulation / Gait / Stairs / Wheelchair Mobility  Ambulation/Gait Ambulation/Gait assistance: Physicist, medical (Feet): 10 Feet Assistive device: Rolling walker (2 wheeled) Gait Pattern/deviations: Step-to pattern General Gait Details: cues for position in RW and safety Gait velocity interpretation: Below normal speed for age/gender    Posture / Balance Balance Overall balance assessment: Needs assistance Sitting balance-Leahy Scale: Good Standing balance-Leahy Scale: Poor    Special needs/care consideration BiPAP/CPAP   no CPM   no Continuous Drip IV   no Dialysis   yes        Days MWF Life Vest no Oxygen  no Special Bed   no Trach Size  no Wound Vac (area)  no      Skin   Left BKA bandage intact and dry; L UE graft site bandaged                              Bowel mgmt: last BM 04/16/15, walked to bathroom with assist Bladder mgmt: anuric Diabetic mgmt yes     Previous Home Environment Living Arrangements: Spouse/significant other  Lives With: Spouse, Daughter (24 year daughter lives in the home; works and goes to [employer]) Available Help at Discharge: Available  PRN/intermittently (wife is in the home with 5 children she cares for) Type of Home: House Home Layout: One level Home Access: Stairs to enter [employer] of Steps:  (at rear entrance, 6 at front entrance) Bathroom Shower/Tub: Tub/shower unit Home Care Services: Yes Type of Home Care Services: Home PT,  Home RN [place] Go) [place] (if known): [place] Additional Comments: wife has an in-home daycare and cannot provide 24hr assist  Discharge Living Setting Plans for Discharge Living Setting: Patient's home Type of Home at Discharge: House Discharge Home Layout: One level Discharge Home Access: Stairs to enter Entrance Stairs-Rails: Left Entrance Stairs-Number of Steps: 2 (at back entrance; pt.uses 6 steps at front with crutch/rail) Discharge Bathroom Shower/Tub: Tub/shower unit Discharge Bathroom Toilet: Standard Discharge Bathroom Accessibility: Yes How Accessible: Accessible via walker Does the patient have any problems obtaining your medications?: No  Social/Family/Support Systems Patient Roles: Spouse, Parent Anticipated Caregiver: wife, April Leonhard Anticipated Caregiver's Contact Information: [phone] home Ability/Limitations of Caregiver: wife has an in home day care: cares for 5 children. Pt. states she can supervise him as much as needed Caregiver Availability: Intermittent Discharge Plan Discussed with Primary Caregiver: No (pt. discussed with wife) Is Caregiver In Agreement with Plan?: Yes Does Caregiver/Family have Issues with Lodging/Transportation while Pt is in Rehab?: No   Goals/Additional Needs Patient/Family Goal for Rehab: modified independent and supervision with PT/OT; n/a SLP Expected length of stay: 10-14 days Cultural Considerations: none Dietary Needs: renal diet with 1200 ml fluid restrictions, thin liquids Equipment Needs: TBA Pt/Family Agrees to Admission and willing to participate: Yes Program Orientation Provided & Reviewed with Pt/Caregiver Including Roles  & Responsibilities: Yes   Decrease burden of Care through IP rehab admission: Other-no   Possible need for SNF placement upon discharge:   Not anticipated   Patient Condition: This patient's condition remains as documented in the consult dated 04/17/15 , in which the  Rehabilitation Physician determined and documented that the patient's condition is appropriate for intensive rehabilitative care in an inpatient rehabilitation facility. Will admit to inpatient rehab today.  Preadmission Screen Completed By:  Gerlean Ren, 04/17/2015 2:58 PM ______________________________________________________________________   Discussed status with Dr.  Posey Pronto on 04/17/15 at  1512  and received telephone approval for admission today.  Admission Coordinator:  Gerlean Ren, time [id number] /Date 04/17/15

## 2015-04-17 NOTE — Progress Notes (Signed)
Hypoglycemic Event  CBG: 63  Treatment: 1/2 cup of ginger ale  Symptoms: asymptomatic  Follow-up CBG: Time:07:30 CBG Result:79  Possible Reasons for Event: insulin @ bedtime/nausea and vomiting yesterday  Comments/MD notified: Might consider adjusting insulin.Nursing will continue to monitor.     Murlin Schrieber Lyn A  Remember to initiate Hypoglycemia Order Set & complete

## 2015-04-17 NOTE — Progress Notes (Signed)
Pt just taken to HD and will follow up later to see if pt able to do some therapy.  Mee Hives, PT MS Acute Rehab Dept. Number: [facility] [id number] and [place] [phone]

## 2015-04-17 NOTE — Interval H&P Note (Signed)
[name] was admitted today to Inpatient Rehabilitation with the diagnosis of left BKA secondary nonhealing ulcer.  The patient's history has been reviewed, patient examined, and there is no change in status.  Patient continues to be appropriate for intensive inpatient rehabilitation.  I have reviewed the patient's chart and labs.  Questions were answered to the patient's satisfaction. The PAPE has been reviewed and assessment remains appropriate.  [name] 04/17/2015, 8:27 PM

## 2015-04-17 NOTE — Care Management Important Message (Signed)
Important Message  Patient Details  Name: [name] MRN: [id number] Date of Birth: 1965/09/29   Medicare Important Message Given:  Yes-second notification given    Delorse Lek 04/17/2015, 12:02 PM

## 2015-04-17 NOTE — Care Management Note (Signed)
Case Management Note  Patient Details  Name: [name] MRN: [id number] Date of Birth: Apr 03, 1966  Subjective/Objective:           S/p left transtibial amputation         Action/Plan: PT recommended inpatient rehab. Referral made to CIR. Per Manuela Schwartz with CIR, patient to be discharged to CIR today.   Expected Discharge Date:                  Expected Discharge Plan:  [place]  In-House Referral:  NA  Discharge planning Services  CM Consult  Post Acute Care Choice:  IP Rehab Choice offered to:     DME Arranged:    DME Agency:     HH Arranged:    [place] Agency:     Status of Service:  Completed, signed off  Medicare Important Message Given:  Yes-second notification given Date Medicare IM Given:    Medicare IM give by:    Date Additional Medicare IM Given:    Additional Medicare Important Message give by:     If discussed at [place] of Stay Meetings, dates discussed:    Additional Comments:  Nila Nephew, RN 04/17/2015, 3:33 PM

## 2015-04-17 NOTE — Progress Notes (Signed)
Ankit Lorie Phenix, MD Physician Signed Physical Medicine and Rehabilitation Consult Note 04/17/2015 6:50 AM  Related encounter: Admission (Current) from 04/14/2015 in [facility] Collapse All        Physical Medicine and Rehabilitation Consult Reason for Consult: Left transtibial amputation Referring Physician: Dr. Sharol Given   HPI: [name] is a 49 y.o. right handed male with history of hypertension, morbid obesity, end-stage renal disease with hemodialysis, diabetes mellitus and peripheral neuropathy, right ankle fusion Charcot joint 2008, left first and second ray amputations July and August 2016. Lives with spouse independently prior to admission with occasional use of cane. He has 6 steps to enter his 1 story with home with b/l railings. Presented 04/14/2015 with left foot dehiscence and limb was not felt to be salvageable and underwent left transtibial amputation 04/14/2015 per Dr. Sharol Given. Hospital course pain management. Hemodialysis ongoing as per renal services. Physical therapy evaluation completed 04/15/2015 with recommendations of physical medicine rehabilitation consult.   Review of Systems  Constitutional: Negative for fever and chills.  HENT: Negative for hearing loss.  Eyes: Negative for blurred vision and double vision.  Respiratory: Negative for cough and shortness of breath.  Cardiovascular: Positive for leg swelling. Negative for chest pain.  Gastrointestinal: Positive for constipation. Negative for nausea and vomiting.   GERD  Genitourinary: Negative for dysuria and hematuria.  Musculoskeletal: Positive for myalgias, back pain and joint pain.  Neurological: Positive for weakness. Negative for headaches.  All other systems reviewed and are negative.  Past Medical History  Diagnosis Date  . Hypertension   . Chest pain   . Morbid obesity ([place])   . Bronchitis   . Blood transfusion   .  H/O hiatal hernia   . Chronic kidney disease     dialysis, M-W-F, [facility]  . GERD (gastroesophageal reflux disease)   . Dysrhythmia     irregular heartbeat  . Hyperparathyroidism ([place])   . ESRD (end stage renal disease) on dialysis ([place])     first HD 06/19/04  . Pneumonia   . Diabetes mellitus     type 2  . Anemia    Past Surgical History  Procedure Laterality Date  . Av fistula placement  10/11/2010  . Ankle fusion      rt foot charcot joint 2008 with refusion  . Av fistula placement  07/02/2011    Procedure: INSERTION OF ARTERIOVENOUS (AV) GORE-TEX GRAFT ARM; Surgeon: Elam Dutch, MD; Location: [place]; Service: Vascular; Laterality: Left;  . Thrombectomy    . Insertion of dialysis catheter  01/20/2012    Procedure: INSERTION OF DIALYSIS CATHETER; Surgeon: Serafina Mitchell, MD; Location: [place]; Service: Vascular; Laterality: N/A; insertion left internal jugular dialysis catheter 23cm  . Umbilcial hernia    . Parathyroidectomy    . Avgg removal Right 11/06/2012    Procedure: REMOVAL OF ARTERIOVENOUS GORETEX GRAFT ([place]); Surgeon: Mal Misty, MD; Location: [place]; Service: Vascular; Laterality: Right;  . Av fistula placement Right 11/06/2012    Procedure: INSERTION OF ARTERIOVENOUS (AV) GORE-TEX GRAFT ARM; Surgeon: Mal Misty, MD; Location: [place]; Service: Vascular; Laterality: Right;  . Insertion of dialysis catheter Right 11/09/2012    Procedure: INSERTION OF DIALYSIS CATHETER; Surgeon: Conrad Woodson Terrace, MD; Location: [place]; Service: Vascular; Laterality: Right; Right Femoral Vein  . Venogram N/A 01/06/2014    Procedure: VENOGRAM; Surgeon: Conrad [place], MD; Location[facility] CATH LAB; Service: Cardiovascular; Laterality: N/A;  .  Amputation Left 01/20/2015    Procedure: Left Great Toe Amputation; Surgeon: Newt Minion, MD; Location: [place]; Service:  Orthopedics; Laterality: Left;  . Amputation Left 03/07/2015    Procedure: Left Foot 1st and 2nd Ray Amputation; Surgeon: Newt Minion, MD; Location: [place]; Service: Orthopedics; Laterality: Left;  . Amputation Left 04/14/2015    Procedure: LEFT BELOW THE KNEE AMPUTATION ; Surgeon: Newt Minion, MD; Location: [place]; Service: Orthopedics; Laterality: Left;   Family History  Problem Relation Age of Onset  . Heart disease Mother   . Kidney disease Mother   . Hypertension Mother    Social History:  reports that he quit smoking about 3 months ago. His smoking use included Cigarettes. He has a 2 pack-year smoking history. He has never used smokeless tobacco. He reports that he does not drink alcohol or use illicit drugs. Allergies: No Known Allergies Medications Prior to Admission  Medication Sig Dispense Refill  . b complex-vitamin c-folic acid (NEPHRO-VITE) 0.8 MG TABS Take 0.8 mg by mouth at bedtime.     . BD PEN NEEDLE NANO U/F 32G X 4 MM MISC     . calcium acetate (PHOSLO) 667 MG capsule Take 2,668 mg by mouth 3 (three) times daily with meals.    Marland Kitchen LEVEMIR FLEXTOUCH 100 UNIT/ML Pen Inject 35 Units into the skin 3 (three) times daily as needed. Per Sliding Scale    . oxyCODONE-acetaminophen (PERCOCET) 10-325 MG per tablet Take 1-2 tablets by mouth every 4 (four) hours as needed for pain. 60 tablet 0  . repaglinide (PRANDIN) 0.5 MG tablet Take 0.5 mg by mouth 2 (two) times daily before a meal.       Home: Home Living Family/patient expects to be discharged to:: Private residence Living Arrangements: Spouse/significant other Available Help at Discharge: Available PRN/intermittently Type of Home: House Home Access: Stairs to enter Technical brewer of Steps: 2 Home Layout: One level Home Equipment: Environmental consultant - 2 wheels, Crutches Additional Comments: wife has an in-home daycare and cannot provide 24hr assist   Functional History: Prior Function Level of Independence: Independent with assistive device(s) Comments: pt has been using crutches, hopping up steps on RLE and performing own ADLs Functional Status:  Mobility: Bed Mobility Overal bed mobility: Modified Independent Transfers Overall transfer level: Needs assistance Transfers: Sit to/from Stand Sit to Stand: Min guard General transfer comment: cues for hand placement and safety Ambulation/Gait Ambulation/Gait assistance: Min guard Ambulation Distance (Feet): 10 Feet Assistive device: Rolling walker (2 wheeled) Gait Pattern/deviations: Step-to pattern General Gait Details: cues for position in RW and safety Gait velocity interpretation: Below normal speed for age/gender    ADL:    Cognition: Cognition Overall Cognitive Status: Within Functional Limits for tasks assessed Orientation Level: Oriented X4 Cognition Arousal/Alertness: Awake/alert Behavior During Therapy: WFL for tasks assessed/performed Overall Cognitive Status: Within Functional Limits for tasks assessed  Blood pressure 106/54, pulse 89, temperature 98.9 F (37.2 C), temperature source Oral, resp. rate 18, height 6\' 1"  (1.854 m), weight 114.1 kg (251 lb 8.7 oz), SpO2 90 %. Physical Exam  Vitals reviewed. Constitutional: He is oriented to person, place, and time. He appears well-developed and well-nourished.  49 year old African-American male  HENT:  Head: Normocephalic and atraumatic.  Eyes: Conjunctivae and EOM are normal.  Neck: Normal range of motion. Neck supple. No thyromegaly present.  Cardiovascular: Normal rate and regular rhythm.  Respiratory: Effort normal and breath sounds normal.  GI: Soft. Bowel sounds are normal. He exhibits no distension.  Musculoskeletal:  Normal range of motion.  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  Diminished sensation right foot   Skin: Skin is warm and dry.  Left BKA site is dressed appropriately tender.  Psychiatric: He has a normal mood and affect. His behavior is normal.     Lab Results Last 24 Hours    Results for orders placed or performed during the hospital encounter of 04/14/15 (from the past 24 hour(s))  Glucose, capillary Status: Abnormal   Collection Time: 04/16/15 11:44 AM  Result Value Ref Range   Glucose-Capillary 120 (H) 65 - 99 mg/dL  Glucose, capillary Status: Abnormal   Collection Time: 04/16/15 4:17 PM  Result Value Ref Range   Glucose-Capillary 113 (H) 65 - 99 mg/dL  Glucose, capillary Status: Abnormal   Collection Time: 04/16/15 7:50 PM  Result Value Ref Range   Glucose-Capillary 164 (H) 65 - 99 mg/dL  Glucose, capillary Status: Abnormal   Collection Time: 04/17/15 6:52 AM  Result Value Ref Range   Glucose-Capillary 63 (L) 65 - 99 mg/dL  CBC Status: Abnormal   Collection Time: 04/17/15 8:49 AM  Result Value Ref Range   WBC 6.4 4.0 - 10.5 K/uL   RBC 2.82 (L) 4.22 - 5.81 MIL/uL   Hemoglobin 7.5 (L) 13.0 - 17.0 g/dL   HCT 25.1 (L) 39.0 - 52.0 %   MCV 89.0 78.0 - 100.0 fL   MCH 26.6 26.0 - 34.0 pg   MCHC 29.9 (L) 30.0 - 36.0 g/dL   RDW 17.2 (H) 11.5 - 15.5 %   Platelets 99 (L) 150 - 400 K/uL  Renal function panel Status: Abnormal   Collection Time: 04/17/15 8:49 AM  Result Value Ref Range   Sodium 137 135 - 145 mmol/L   Potassium 4.2 3.5 - 5.1 mmol/L   Chloride 92 (L) 101 - 111 mmol/L   CO2 30 22 - 32 mmol/L   Glucose, Bld 145 (H) 65 - 99 mg/dL   BUN 55 (H) 6 - 20 mg/dL   Creatinine, Ser 11.93 (H) 0.61 - 1.24 mg/dL   Calcium 9.4 8.9 - 10.3 mg/dL   Phosphorus 9.0 (H) 2.5 - 4.6 mg/dL   Albumin 2.7 (L) 3.5 - 5.0 g/dL   GFR calc non Af Amer 4 (L) >60 mL/min   GFR calc Af Amer 5 (L) >60 mL/min   Anion gap 15 5  - 15      Imaging Results (Last 48 hours)    No results found.    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 Does the need for close, 24 hr/day medical supervision in concert with the patient's rehab needs make it unreasonable for this patient to be served in a less intensive setting? Yes 2. Co-Morbidities requiring supervision/potential complications: PVD, ESRD, Anemia 3. Due to safety, skin/wound care, disease management, medication administration, pain management and patient education, does the patient require 24 hr/day rehab nursing? Yes 4. Does the patient require coordinated care of a physician, rehab nurse, PT (1-2 hrs/day, 5 days/week) and OT (1-2 hrs/day, 5 days/week) to address physical and functional deficits in the context of the above medical diagnosis(es)? Yes Addressing deficits in the following areas: balance, transferring, bowel/bladder control, bathing, toileting and psychosocial support 5. Can the patient actively participate in an intensive therapy program of at least 3 hrs of therapy per day at least 5 days per week? Potentially 6. The potential for patient  to make measurable gains while on inpatient rehab is excellent 7. Anticipated functional outcomes upon discharge from inpatient rehab are modified independent and supervision with PT, modified independent and supervision with OT, n/a with SLP. 8. Estimated rehab length of stay to reach the above functional goals is: 10-14 days 9. Does the patient have adequate social supports and living environment to accommodate these discharge functional goals? Potentially 10. Anticipated D/C setting: Home 11. Anticipated post D/C treatments: HH therapy and Home excercise program 12. Overall Rehab/Functional Prognosis: excellent  RECOMMENDATIONS: This patient's condition is appropriate for continued rehabilitative care in the following setting: CIR Patient has agreed to participate in recommended  program. Yes Note that insurance prior authorization may be required for reimbursement for recommended care.  Comment: Rehab Admissions Coordinator to follow up, with confirmation of access into/out of house if 24/7 support not available.    04/17/2015 ,       Revision History

## 2015-04-17 NOTE — Progress Notes (Signed)
Patient ID: [name], male   DOB: 08-21-1965, 49 y.o.   MRN: [id number] PT has recommended CIR, rehab consulted

## 2015-04-17 NOTE — Progress Notes (Signed)
Inpatient Diabetes Program Recommendations  AACE/ADA: New Consensus Statement on Inpatient Glycemic Control (2015)  Target Ranges:  Prepandial:   less than 140 mg/dL      Peak postprandial:   less than 180 mg/dL (1-2 hours)      Critically ill patients:  140 - 180 mg/dL    Results for [name], [name] (MRN [id number]) as of 04/17/2015 09:29  Ref. Range 04/16/2015 06:20 04/16/2015 11:44 04/16/2015 16:17 04/16/2015 19:50  Glucose-Capillary Latest Ref Range: 65-99 mg/dL 83 120 (H) 113 (H) 164 (H)    Results for [name], [name] (MRN [id number]) as of 04/17/2015 09:29  Ref. Range 04/17/2015 06:52  Glucose-Capillary Latest Ref Range: 65-99 mg/dL 63 (L)     Admit for Transtibial Amputation.  History: DM, ESRD  Home DM Meds: Levemir 35 units per SSI??       Prandin 0.5 mg bid  Current Insulin Orders: Levemir 20 units QHS      Novolog Sensitive SSI (0-9 units) TID AC      Novolog 3 units tidwc        Prandin 0.5 mg bid     MD- Patient with Hypoglycemia this AM (CBG 63 mg/dl).  Fasting glucose levels have been trending in the 70-80 mg/dl range for the last two days.  Please consider decreasing Levemir to 15 units QHS     ----Will follow patient during hospitalization----  Wyn Quaker RN, MSN, CDE Diabetes Coordinator Inpatient Glycemic Control Team Team Pager: [phone] (8a-5p)

## 2015-04-17 NOTE — Progress Notes (Signed)
  [facility] Progress Note   Subjective: no complaints  Filed Vitals:   04/17/15 0810 04/17/15 0835 04/17/15 0905 04/17/15 0935  BP: 130/66 108/50 107/54 106/53  Pulse: 90 87 87 86  Temp:      TempSrc:      Resp:      Height:      Weight:      SpO2:       Exam: Alert, on HD, no distress No jvd Chest clear bilat RRR  Abd obese soft ntnd no ascites L BKA wrapped RLE no edema, R foot chronic deformity L arm HeRO +bruit Neuro Ox 3  [facility]  4.5h   116kg   2/2.25 bath   HeRO access L arm  Heparin 5000 then 2500 mid Mircera 150 ug last 9/21 Calcitriol 2.5 ug Last Hb 8.6, tsat 25%, ferr 1300  pht 216, P 6-8      Assessment: 1 SP L BKA 9/30 2 ESRD HD mwf 3 Vol no BP meds, establish new dry wt 4 Anemia cont esa 5 MBD cont meds 6 DM per primary 7 Dispo per prim  Plan - HD today    Kelly Splinter MD  pager [phone]    cell [phone]  04/17/2015, 9:56 AM     Recent Labs Lab 04/14/15 1253 04/14/15 1930 04/17/15 0849  NA 140 137 137  K 4.0 3.8 4.2  CL 98* 93* 92*  CO2 29 32 30  GLUCOSE 180* 172* 145*  BUN 31* 31* 55*  CREATININE 7.60* 8.06* 11.93*  CALCIUM 9.6 9.3 9.4  PHOS  --  6.6* 9.0*    Recent Labs Lab 04/14/15 1253 04/14/15 1930 04/17/15 0849  AST 11*  --   --   ALT 7*  --   --   ALKPHOS 49  --   --   BILITOT 0.6  --   --   PROT 7.0  --   --   ALBUMIN 3.1* 3.1* 2.7*    Recent Labs Lab 04/14/15 1253 04/14/15 1930 04/17/15 0849  WBC 7.6 8.3 6.4  HGB 8.6* 8.9* 7.5*  HCT 29.0* 28.8* 25.1*  MCV 90.6 89.4 89.0  PLT 123* 127* 99*   . aspirin EC  325 mg Oral Daily  . calcitRIOL  2.5 mcg Oral Q M,W,F-HD  . calcium acetate  2,668 mg Oral TID WC  . darbepoetin (ARANESP) injection - DIALYSIS  150 mcg Intravenous Q Mon-HD  . docusate sodium  100 mg Oral BID  . ferric gluconate (FERRLECIT/NULECIT) IV  125 mg Intravenous Q M,W,F-HD  . insulin aspart  0-9 Units Subcutaneous TID WC  . insulin aspart  3 Units Subcutaneous TID WC   . insulin detemir  20 Units Subcutaneous QHS  . multivitamin  1 tablet Oral QHS  . repaglinide  0.5 mg Oral BID AC   . sodium chloride     sodium chloride, sodium chloride, acetaminophen **OR** acetaminophen, alteplase, bisacodyl, diphenhydrAMINE, heparin, HYDROmorphone (DILAUDID) injection, lidocaine (PF), lidocaine-prilocaine, methocarbamol **OR** methocarbamol (ROBAXIN)  IV, metoCLOPramide **OR** metoCLOPramide (REGLAN) injection, ondansetron **OR** ondansetron (ZOFRAN) IV, oxyCODONE, pentafluoroprop-tetrafluoroeth, polyethylene glycol

## 2015-04-17 NOTE — Progress Notes (Signed)
Rehab Admissions Coordinator Note:  Patient was screened by Retta Diones for appropriateness for an Inpatient Acute Rehab Consult.  At this time, an inpatient rehab consult has been ordered and is pending.  We will follow up once consult is completed.  Jodell Cipro M 04/17/2015, 8:17 AM  I can be reached at [phone].

## 2015-04-18 ENCOUNTER — Inpatient Hospital Stay (HOSPITAL_COMMUNITY): Payer: Medicare Other | Admitting: Physical Therapy

## 2015-04-18 ENCOUNTER — Inpatient Hospital Stay (HOSPITAL_COMMUNITY): Payer: Medicare Other | Admitting: Occupational Therapy

## 2015-04-18 DIAGNOSIS — N186 End stage renal disease: Secondary | ICD-10-CM

## 2015-04-18 DIAGNOSIS — Z992 Dependence on renal dialysis: Secondary | ICD-10-CM

## 2015-04-18 DIAGNOSIS — IMO0002 Reserved for concepts with insufficient information to code with codable children: Secondary | ICD-10-CM

## 2015-04-18 DIAGNOSIS — E1165 Type 2 diabetes mellitus with hyperglycemia: Secondary | ICD-10-CM

## 2015-04-18 DIAGNOSIS — S88112S Complete traumatic amputation at level between knee and ankle, left lower leg, sequela: Secondary | ICD-10-CM

## 2015-04-18 DIAGNOSIS — E11610 Type 2 diabetes mellitus with diabetic neuropathic arthropathy: Secondary | ICD-10-CM

## 2015-04-18 LAB — GLUCOSE, CAPILLARY
GLUCOSE-CAPILLARY: 177 mg/dL — AB (ref 65–99)
GLUCOSE-CAPILLARY: 137 mg/dL — AB (ref 65–99)
GLUCOSE-CAPILLARY: 183 mg/dL — AB (ref 65–99)
Glucose-Capillary: 131 mg/dL — ABNORMAL HIGH (ref 65–99)
Glucose-Capillary: 156 mg/dL — ABNORMAL HIGH (ref 65–99)

## 2015-04-18 MED ORDER — DARBEPOETIN ALFA 200 MCG/0.4ML IJ SOSY
200.0000 ug | PREFILLED_SYRINGE | INTRAMUSCULAR | Status: DC
  Administered 2015-04-24: 200 ug via INTRAVENOUS
  Filled 2015-04-18: qty 0.4

## 2015-04-18 NOTE — Progress Notes (Addendum)
[facility] PHYSICAL MEDICINE & REHABILITATION     PROGRESS NOTE    Subjective/Complaints: Had a reasonable night. Pain under control. Ready for therapies.  ROS: Pt denies fever, rash/itching, headache, blurred or double vision, nausea, vomiting, abdominal pain, diarrhea, chest pain, shortness of breath, palpitations, dysuria, dizziness, neck or back pain, bleeding, anxiety, or depression   Objective: Vital Signs: Blood pressure 120/56, pulse 103, temperature 100.4 F (38 C), temperature source Oral, resp. rate 18, SpO2 96 %. No results found.  Recent Labs  04/17/15 0849  WBC 6.4  HGB 7.5*  HCT 25.1*  PLT 99*    Recent Labs  04/17/15 0849  NA 137  K 4.2  CL 92*  GLUCOSE 145*  BUN 55*  CREATININE 11.93*  CALCIUM 9.4   CBG (last 3)   Recent Labs  04/17/15 1618 04/17/15 2053 04/18/15 0632  GLUCAP 116* 230* 131*    Wt Readings from Last 3 Encounters:  04/17/15 111 kg (244 lb 11.4 oz)  01/06/14 117.935 kg (260 lb)  12/16/13 125.193 kg (276 lb)    Physical Exam:  Constitutional: no distress. He appears well-developed and well-nourished.  49 year old African-American male  HENT:  Head: Normocephalic and atraumatic.  Eyes: Conjunctivae and EOM are normal.  Neck: Normal range of motion. Neck supple. No thyromegaly present.  Cardiovascular: Normal rate and regular rhythm. no murmurs. Respiratory: Effort normal and breath sounds normal.  GI: Soft. Bowel sounds are normal. He exhibits no distension.  Musculoskeletal: Normal range of motion.  Left BKA in coban dressing RUE, LUE, LLE 5/5 strength RLE charcot foot,  Neurological: He is alert and oriented to person, place, and time.  Diminished sensation right foot, charcot deformity.  Skin: Skin is warm and dry.  Left BKA site is dressed appropriately tender. Left foot appears intact Psychiatric: He has a normal mood and affect, perhaps a little flat. His behavior is normal  Assessment/Plan: 1.  Functional deficits secondary to left BKA which require 3+ hours per day of interdisciplinary therapy in a comprehensive inpatient rehab setting. Physiatrist is providing close team supervision and 24 hour management of active medical problems listed below. Physiatrist and rehab team continue to assess barriers to discharge/monitor patient progress toward functional and medical goals.  Function:  Bathing Bathing position      Bathing parts      Bathing assist        Upper Body Dressing/Undressing Upper body dressing                    Upper body assist        Lower Body Dressing/Undressing Lower body dressing                                  Lower body assist        Toileting Toileting   Toileting steps completed by patient: Adjust clothing prior to toileting, Performs perineal hygiene, Adjust clothing after toileting   Toileting Assistive Devices: Grab bar or rail  Toileting assist Assist level: Supervision or verbal cues   Transfers Chair/bed transfer             Locomotion Ambulation           Wheelchair          Cognition Comprehension Comprehension assist level: Understands complex 90% of the time/cues 10% of the time  Expression Expression assist level: Expresses complex 90% of the time/cues <  10% of the time  Social Interaction Social Interaction assist level: Interacts appropriately 90% of the time - Needs monitoring or encouragement for participation or interaction.  Problem Solving Problem solving assist level: Solves complex 90% of the time/cues < 10% of the time  Memory     Medical Problem List and Plan: 1. Functional deficits secondary to left BKA secondary nonhealing ulcer 04/14/2015  -begin therapies today 2. DVT Prophylaxis/Anticoagulation: SCDs right lower extremity. OOB 3. Pain Management: Oxycodone and Robaxin as needed. Monitor with increased mobility 4. End-stage renal disease with hemodialysis. Follow-up per  renal services. HD in PM after therapy day 5. Neuropsych: This patient is capable of making decisions on his own behalf. 6. Skin/Wound Care: Routine skin checks 7. Fluids/Electrolytes/Nutrition: Routine I&O with follow-up chemistries in HD 8. Acute on chronic anemia. Continue Aranesp. Follow-up CBC in HD 9. Diabetes mellitus with peripheral neuropathy. Levemir 20 units daily, NovoLog 3 units 3 times a day, Prandin 0.5 mg twice a day. Sliding scale insulin.   -fair control at present. Follow for pattern  -diabetic teaching 10. Hypertension. Monitor with increased mobility. No current anti-hypertensive medication 11. Charcot right foot. Status post ankle fusion 2008 12. Low grade temp: no overt signs of infection. Follow clinically. Open dressing if needed.   LOS (Days) 1 A FACE TO FACE EVALUATION WAS PERFORMED  Sakoya Win T 04/18/2015 8:03 AM

## 2015-04-18 NOTE — Progress Notes (Signed)
Subjective:   Therapy going well, no complaints  Objective Filed Vitals:   04/17/15 1852 04/18/15 0518  BP: 122/63 120/56  Pulse: 106 103  Temp: 100.2 F (37.9 C) 100.4 F (38 C)  TempSrc: Oral Oral  Resp: 18 18  SpO2: 95% 96%   Physical Exam General: alert and oriented. no acute distress.  Heart: RRR  Lungs: CTA, unlabored Extremities: L BKA dressing intact. R leg brace Dialysis Access: L Hero   Dialysis Orders: [facility] 4[facility] - -rarely runs full time EDW 116- leaving a little below 2 K 2.25 Ca 450/800 HERO heparin 5000 with 2500 mid Mircera 150 last 9/21 no Fe calcitriol 2.5 Recent labs; Hgb 8.6 25% sat ferritin 1300 (July) iPTH 216 Ca upper 9s P 6-8   Assessment/Plan: 1. nonhealing foot wound- L BKA  Dr. Sharol Given - now in rehab 2. ESRD - MWF - next HD pending tomorrow.  3. Hypertension/volume - not on BP meds; volume control only - re-establish edw- wt last night 111 kgs 4. Anemia -  Hgb 7.5 - inc ESA, cont Fe bolus. transfuse prn 5. Metabolic bone disease - Chronically elevated phosphorus iPTH 216- takes 4 phoslo and 2 velphoro 6. Nutrition - renal diet and multivit. Alb 2.7 7. Thrombocytopenia - follow platelets. 99  yesterday 8. DM - per primary  Shelle Iron, NP [place] [phone] 04/18/2015,10:03 AM  LOS: 1 day   Pt seen, examined and agree w A/P as above.  Kelly Splinter MD pager [phone]    cell [phone] 04/18/2015, 11:50 AM    Additional Objective Labs: Basic Metabolic Panel:  Recent Labs Lab 04/14/15 1253 04/14/15 1930 04/17/15 0849  NA 140 137 137  K 4.0 3.8 4.2  CL 98* 93* 92*  CO2 29 32 30  GLUCOSE 180* 172* 145*  BUN 31* 31* 55*  CREATININE 7.60* 8.06* 11.93*  CALCIUM 9.6 9.3 9.4  PHOS  --  6.6* 9.0*   Liver Function Tests:  Recent Labs Lab 04/14/15 1253 04/14/15 1930 04/17/15 0849  AST 11*  --   --   ALT 7*  --   --   ALKPHOS 49  --   --   BILITOT 0.6  --   --   PROT 7.0  --   --   ALBUMIN 3.1*  3.1* 2.7*   No results for input(s): LIPASE, AMYLASE in the last 168 hours. CBC:  Recent Labs Lab 04/14/15 1253 04/14/15 1930 04/17/15 0849  WBC 7.6 8.3 6.4  HGB 8.6* 8.9* 7.5*  HCT 29.0* 28.8* 25.1*  MCV 90.6 89.4 89.0  PLT 123* 127* 99*   Blood Culture    Component Value Date/Time   SDES WOUND EXIT SITE ARM RIGHT 11/06/2012 1820   SDES GRAFT ARM RIGHT 11/06/2012 1820   SDES WOUND EXIT SITE ARM RIGHT 11/06/2012 1820   SPECREQUEST  11/06/2012 1820    PATIENT ON FOLLOWING VANCOMYCIN INFECTED AV GRAFT SWAB   SPECREQUEST PATIENT ON FOLLOWING VANCOMYCIN AV GRAFT 11/06/2012 1820   SPECREQUEST  11/06/2012 1820    PATIENT ON FOLLOWING VANCOMYCIN INFECTED AV GRAFT SWAB   CULT  11/06/2012 1820    MODERATE METHICILLIN RESISTANT STAPHYLOCOCCUS AUREUS Note: RIFAMPIN AND GENTAMICIN SHOULD NOT BE USED AS SINGLE DRUGS FOR TREATMENT OF STAPH INFECTIONS. This organism DOES NOT demonstrate inducible Clindamycin resistance in vitro. CRITICAL RESULT CALLED TO, READ BACK BY AND VERIFIED WITH: JAMIE Y 4/28 @900   BY REAMM   CULT  11/06/2012 1820    ABUNDANT STAPHYLOCOCCUS AUREUS  Note: SUSCEPTIBILITIES PERFORMED ON PREVIOUS CULTURE WITHIN THE LAST 5 DAYS.   CULT NO ANAEROBES ISOLATED 11/06/2012 1820   REPTSTATUS 11/09/2012 FINAL 11/06/2012 1820   REPTSTATUS 11/10/2012 FINAL 11/06/2012 1820   REPTSTATUS 11/11/2012 FINAL 11/06/2012 1820    Cardiac Enzymes: No results for input(s): CKTOTAL, CKMB, CKMBINDEX, TROPONINI in the last 168 hours. CBG:  Recent Labs Lab 04/17/15 0729 04/17/15 1347 04/17/15 1618 04/17/15 2053 04/18/15 0632  GLUCAP 79 115* 116* 230* 131*   Iron Studies: No results for input(s): IRON, TIBC, TRANSFERRIN, FERRITIN in the last 72 hours. @lablastinr3 @ Studies/Results: No results found. Medications:   . aspirin EC  325 mg Oral Daily  . [START ON 04/19/2015] calcitRIOL  2.5 mcg Oral Q M,W,F-HD  . calcium acetate  2,668 mg Oral TID WC  . [START ON 04/24/2015]  darbepoetin (ARANESP) injection - DIALYSIS  150 mcg Intravenous Q Mon-HD  . docusate sodium  100 mg Oral BID  . [START ON 04/19/2015] ferric gluconate (FERRLECIT/NULECIT) IV  125 mg Intravenous Q M,W,F-HD  . insulin aspart  0-9 Units Subcutaneous TID WC  . insulin aspart  3 Units Subcutaneous TID WC  . insulin detemir  20 Units Subcutaneous QHS  . repaglinide  0.5 mg Oral BID AC

## 2015-04-18 NOTE — Progress Notes (Signed)
Occupational Therapy Session Note  Patient Details  Name: [name] MRN: [id number] Date of Birth: 04-06-1966  Today's Date: 04/18/2015 OT Individual Time: [id number] OT Individual Time Calculation (min): 24 min  and Today's Date: 04/18/2015 OT Missed Time: 36 Minutes Missed Time Reason: Patient unwilling/refused to participate without medical reason;Patient ill (comment) (nausea)   Short Term Goals: Week 1:  OT Short Term Goal 1 (Week 1): STGs=LTGs secondary to estimated short LOS  Skilled Therapeutic Interventions/Progress Updates:  Upon entering the room, pt seated in recliner chair asleep. Pt with no c/o pain this session. Pt reports having continued feelings of nausea and states, "Something is not right. I want to speak to my doctor." RN notified. Pt also refusing lunch this afternoon. OT educated pt on energy conservation techniques for self care and was provided with paper handout to read further. Pt declined further discussion and refused all other OT intervention. OT educated pt on OT purpose, goals, and expectations with pt continuing to declined. Pt remained seated in wheelchair with call bell and all needed items within reach upon exiting the room.  Therapy Documentation Precautions:  Precautions Precautions: Fall Required Braces or Orthoses: Other Brace/Splint Other Brace/Splint: AFO RLE Restrictions Weight Bearing Restrictions: Yes LLE Weight Bearing: Non weight bearing General: General OT Amount of Missed Time: 36 Minutes   Pain: Pain Assessment Pain Assessment: 0-10 Pain Score: 8  Pain Type: Surgical pain Pain Location: Leg Pain Orientation: Left Pain Descriptors / Indicators: Aching Patients Stated Pain Goal: 2 Pain Intervention(s): Repositioned  See Function Navigator for Current Functional Status.   Therapy/Group: Individual Therapy  [name] 04/18/2015, 1:27 PM

## 2015-04-18 NOTE — Evaluation (Signed)
Occupational Therapy Assessment and Plan  Patient Details  Name: NARADA UZZLE MRN: [id number] Date of Birth: [birth year]  OT Diagnosis: abnormal posture, acute pain and muscle weakness (generalized) Rehab Potential: Rehab Potential (ACUTE ONLY): Good ELOS: 8-10 days   Today's Date: 04/18/2015 OT Individual Time: 6387-5643 OT Individual Time Calculation (min): 84 min     Problem List:  Patient Active Problem List   Diagnosis Date Noted  . Diabetes mellitus type 2, uncontrolled ([place]) 04/18/2015  . 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 S/P BKA (below knee amputation) unilateral ([place]) 04/14/2015  . 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 ESRD on dialysis ([place]) 10/23/2012  . DIASTOLIC DYSFUNCTION 32/95/1884  . PVD 10/22/2007  . ESRD 10/22/2007  . HYPERPARATHYROIDISM, HX OF 10/22/2007    Past Medical History:  Past Medical History  Diagnosis Date  . Hypertension   . Chest pain   . Morbid obesity ([place])   . Bronchitis   . Blood transfusion   . H/O hiatal hernia   . Chronic kidney disease     dialysis, [facility]W-F, [facility]  . GERD (gastroesophageal reflux disease)   . Dysrhythmia     irregular heartbeat  . Hyperparathyroidism ([place])   . ESRD (end stage renal disease) on dialysis ([place])     first HD 06/19/04  . Pneumonia   . Diabetes mellitus     type 2  . Anemia    Past Surgical History:  Past Surgical History  Procedure Laterality Date  . Av fistula placement  10/11/2010  . Ankle fusion      rt foot charcot joint 2008 with refusion  . Av fistula placement  07/02/2011    Procedure: INSERTION OF ARTERIOVENOUS (AV) GORE-TEX GRAFT ARM;  Surgeon: Elam Dutch, MD;  Location: [facility];  Service: Vascular;  Laterality: Left;  . Thrombectomy    . Insertion of dialysis catheter  01/20/2012    Procedure: INSERTION OF DIALYSIS CATHETER;  Surgeon: Serafina Mitchell, MD;  Location: [facility];  Service:  Vascular;  Laterality: N/A;  insertion left internal jugular dialysis catheter 23cm  . Umbilcial hernia    . Parathyroidectomy    . Avgg removal Right 11/06/2012    Procedure: REMOVAL OF ARTERIOVENOUS GORETEX GRAFT ([place]);  Surgeon: Mal Misty, MD;  Location: [facility];  Service: Vascular;  Laterality: Right;  . Av fistula placement Right 11/06/2012    Procedure: INSERTION OF ARTERIOVENOUS (AV) GORE-TEX GRAFT ARM;  Surgeon: Mal Misty, MD;  Location: [facility];  Service: Vascular;  Laterality: Right;  . Insertion of dialysis catheter Right 11/09/2012    Procedure: INSERTION OF DIALYSIS CATHETER;  Surgeon: Conrad Hopatcong, MD;  Location: [facility];  Service: Vascular;  Laterality: Right;  Right  Femoral Vein  . Venogram N/A 01/06/2014    Procedure: VENOGRAM;  Surgeon: Conrad [place], MD;  Location[facility];  Service: Cardiovascular;  Laterality: N/A;  . Amputation Left 01/20/2015    Procedure: Left Great Toe Amputation;  Surgeon: Newt Minion, MD;  Location: [place];  Service: Orthopedics;  Laterality: Left;  . Amputation Left 03/07/2015    Procedure: Left Foot 1st and 2nd Ray Amputation;  Surgeon: Newt Minion, MD;  Location: [facility];  Service: Orthopedics;  Laterality: Left;  . Amputation Left 04/14/2015    Procedure: LEFT BELOW THE KNEE AMPUTATION ;  Surgeon: Newt Minion, MD;  Location: [facility];  Service: Orthopedics;  Laterality: Left;    Assessment & Plan Clinical Impression: Patient is a 49 y.o. year old male with history of hypertension, morbid obesity, end-stage renal disease with hemodialysis, diabetes mellitus and peripheral neuropathy, right ankle fusion Charcot joint 2008, left first and second ray amputations July and August 2016. Lives with spouse independently prior to admission with occasional use of cane. He has 6 steps to enter his 1 story with home with b/l railings. Presented 04/14/2015 with left foot dehiscence and limb was not felt to be salvageable and underwent left transtibial  amputation 04/14/2015 per Dr. Sharol Given. Hospital course pain management. Acute blood loss anemia 7.5-8.9 and monitored as patient continues on Aranesp. Hemodialysis ongoing as per renal services. Physical therapy evaluation completed 04/15/2015 with recommendations of physical medicine rehabilitation consult.. Patient was admitted for a comprehensive rehabilitation program .  Patient transferred to CIR on 04/17/2015 .    Patient currently requires min with basic self-care skills secondary to muscle weakness, decreased cardiorespiratoy endurance and decreased sitting balance, decreased standing balance, decreased postural control and decreased balance strategies.  Prior to hospitalization, patient could complete ADLs with modified independent .  Patient will benefit from skilled intervention to increase independence with basic self-care skills prior to discharge home with care partner.  Anticipate patient will require intermittent supervision and follow up home health.  OT - End of Session Activity Tolerance: Decreased this session Endurance Deficit: Yes Endurance Deficit Description: rest breaks secondary to fatigue OT Assessment Rehab Potential (ACUTE ONLY): Good Barriers to Discharge:  (none known at this time) OT Patient demonstrates impairments in the following area(s): Balance;Endurance;Motor;Safety;Pain OT Basic ADL's Functional Problem(s): Grooming;Bathing;Dressing;Toileting OT Advanced ADL's Functional Problem(s):  (n/a) OT Transfers Functional Problem(s): Toilet;Tub/Shower OT Additional Impairment(s): None OT Plan OT Intensity: Minimum of 1-2 x/day, 45 to 90 minutes OT Frequency: 5 out of 7 days OT Duration/Estimated Length of Stay: 8-10 days OT Treatment/Interventions: Balance/vestibular training;Community reintegration;Patient/family education;Self Care/advanced ADL retraining;Therapeutic Exercise;UE/LE Coordination activities;Wheelchair propulsion/positioning;Discharge  planning;DME/adaptive equipment instruction;Functional mobility training;Pain management;Therapeutic Activities;UE/LE Strength taining/ROM OT Self Feeding Anticipated Outcome(s): n/a OT Basic Self-Care Anticipated Outcome(s): Mod I  OT Toileting Anticipated Outcome(s): Mod I  OT Bathroom Transfers Anticipated Outcome(s): Mod I - toilet, shower- supervision OT Recommendation Recommendations for Other Services: Other (comment) (none at this time) Patient destination: Home Follow Up Recommendations: Home health OT Equipment Recommended: Tub/shower bench;Other (comment) (drop arm commode chair)   Skilled Therapeutic Intervention Upon entering the room, pt supine in bed with 4/10 c/o pain in L stump. OT educated pt on pain management in regards to different types of pain, tapping/rubbing, and pain medications. OT educated pt on OT purpose, POC, and goals and verbalized understanding. Pt donned R shoe and AFO with increased time and steady assist for balance.  Stand pivot transfer with use of RW from bed >wheelchair with steady assist. Pt propelling wheelchair to sink side with min verbal cues for safety awareness such as locking wheelchair before standing. Pt performing grooming while seated in wheelchair with set up A. UB self care while seated in wheelchair and pt standing at sink side with steady A for LB clothing management and hygiene. PT propelled wheelchair with supervision and 23' to ADL apartment. OT educated and demonstrated transfer onto tub bench. Pt returned demonstration with lateral scoot wheelchair >TTB with steady assist and min verbal cues for proper technique and safety awareness. OT recommended purchase of non slip bath mat and use of steady strips in order to decrease fall risk. Pt propelled wheelchair back to  room where he became very ill and vomited multiple times. RN aware. After pt was somewhat recovered her transferred with RW and stand pivot transfer into recliner chair. B LEs  elevated and RN arriving with medication. Call bell and all needed items within reach upon exiting the room.   OT Evaluation Precautions/Restrictions  Precautions Precautions: Fall Required Braces or Orthoses: Other Brace/Splint Other Brace/Splint: AFO RLE Restrictions Weight Bearing Restrictions: Yes LLE Weight Bearing: Non weight bearing  Pain Pain Assessment Pain Assessment: 0-10 Pain Score: 4 Pain Type: Surgical pain Pain Location: Leg Pain Orientation: Left Pain Descriptors / Indicators: Aching Patients Stated Pain Goal: 2 Pain Intervention(s): Repositioned Home Living/Prior Functioning Home Living Family/patient expects to be discharged to:: Private residence Living Arrangements: Spouse/significant other Available Help at Discharge: Available PRN/intermittently (wife has in home daycare with 5 children currently) Type of Home: House Home Access: Stairs to enter [employer] of Steps: 6 step entry through front Entrance Stairs-Rails: Right, Left Home Layout: One level Bathroom Shower/Tub: Tub/shower unit, Architectural technologist: Programmer, systems: Yes  Lives With: Spouse, Daughter IADL History Homemaking Responsibilities: No Prior Function Level of Independence: Requires assistive device for independence  Able to Take Stairs?: Yes Driving: No 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) Leisure: Hobbies-yes (Comment) Comments: likes to watch sports and go out to eat Vision/Perception  Vision- History Baseline Vision/History: Wears glasses Wears Glasses: Reading only Patient Visual Report: No change from baseline Vision- Assessment Vision Assessment?: No apparent visual deficits  Cognition Overall Cognitive Status: Within Functional Limits for tasks assessed Arousal/Alertness: Awake/alert Orientation Level: Person;Place;Situation Person: Oriented Place: Oriented Situation: Oriented Year: 2016 Month: October Day of Week:  Correct Memory: Impaired Memory Impairment: Decreased recall of new information Immediate Memory Recall: Sock;Blue;Bed Memory Recall: Sock;Bed (unable to remember bed at all even with cue) Memory Recall Sock: Without Cue Memory Recall Bed: With Cue Safety/Judgment: Appears intact Sensation Sensation Light Touch: Appears Intact Stereognosis: Not tested Hot/Cold: Appears Intact Coordination Gross Motor Movements are Fluid and Coordinated: No Fine Motor Movements are Fluid and Coordinated: Yes Motor  Motor Motor: Abnormal postural alignment and control Mobility  Transfers Transfers: Sit to Stand;Stand to Sit Sit to Stand: 4: Min guard Stand to Sit: 4: Min guard  Trunk/Postural Assessment  Cervical Assessment Cervical Assessment: Exceptions to [facility] (forward head) Thoracic Assessment Thoracic Assessment: Exceptions to [facility] (rounded shoulders) Lumbar Assessment Lumbar Assessment: Exceptions to [facility] (posterior pelvic tilt)  Balance Balance Balance Assessed: Yes Static Sitting Balance Static Sitting - Balance Support: Feet supported;No upper extremity supported Static Sitting - Level of Assistance: 6: Modified independent (Device/Increase time) Dynamic Sitting Balance Dynamic Sitting - Balance Support: Feet supported;No upper extremity supported Dynamic Sitting - Level of Assistance: 5: Stand by assistance Sitting balance - Comments: donning AFO and R shoe while seated on EOB Static Standing Balance Static Standing - Balance Support: Bilateral upper extremity supported Static Standing - Level of Assistance: 4: Min assist Dynamic Standing Balance Dynamic Standing - Balance Support: Bilateral upper extremity supported Dynamic Standing - Level of Assistance: 4: Min assist Dynamic Standing - Balance Activities: Forward lean/weight shifting;Reaching for objects Dynamic Standing - Comments: standing to wash buttocks and peri area as well as LB clothing management Extremity/Trunk  Assessment RUE Assessment RUE Assessment: Within Functional Limits LUE Assessment LUE Assessment: Within Functional Limits   See Function Navigator for Current Functional Status.   Refer to Care Plan for Long Term Goals  Recommendations for other services: None  Discharge Criteria: Patient will be  discharged from OT if patient refuses treatment 3 consecutive times without medical reason, if treatment goals not met, if there is a change in medical status, if patient makes no progress towards goals or if patient is discharged from hospital.  The above assessment, treatment plan, treatment alternatives and goals were discussed and mutually agreed upon: by patient  [name] 04/18/2015, 11:00 AM

## 2015-04-18 NOTE — Evaluation (Signed)
Physical Therapy Assessment and Plan  Patient Details  Name: [name] MRN: [id number] Date of Birth: [birth year]  PT Diagnosis: Abnormal posture, Abnormality of gait, Coordination disorder, Difficulty walking and Muscle weakness Rehab Potential: Good ELOS: 8-10   Today's Date: 04/18/2015 PT Individual Time: 1100-1200 PT Individual Time Calculation (min): 60 min    Problem List:  Patient Active Problem List   Diagnosis Date Noted  . Diabetes mellitus type 2, uncontrolled ([place]) 04/18/2015  . 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 S/P BKA (below knee amputation) unilateral ([place]) 04/14/2015  . 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 ESRD on dialysis ([place]) 10/23/2012  . DIASTOLIC DYSFUNCTION 32/95/1884  . PVD 10/22/2007  . ESRD 10/22/2007  . HYPERPARATHYROIDISM, HX OF 10/22/2007    Past Medical History:  Past Medical History  Diagnosis Date  . Hypertension   . Chest pain   . Morbid obesity ([place])   . Bronchitis   . Blood transfusion   . H/O hiatal hernia   . Chronic kidney disease     dialysis, M[facility]W-F, [facility]  . GERD (gastroesophageal reflux disease)   . Dysrhythmia     irregular heartbeat  . Hyperparathyroidism ([place])   . ESRD (end stage renal disease) on dialysis ([place])     first HD 06/19/04  . Pneumonia   . Diabetes mellitus     type 2  . Anemia    Past Surgical History:  Past Surgical History  Procedure Laterality Date  . Av fistula placement  10/11/2010  . Ankle fusion      rt foot charcot joint 2008 with refusion  . Av fistula placement  07/02/2011    Procedure: INSERTION OF ARTERIOVENOUS (AV) GORE-TEX GRAFT ARM;  Surgeon: Elam Dutch, MD;  Location: [place];  Service: Vascular;  Laterality: Left;  . Thrombectomy    . Insertion of dialysis catheter  01/20/2012    Procedure: INSERTION OF DIALYSIS CATHETER;  Surgeon: Serafina Mitchell, MD;  Location: [place];  Service:  Vascular;  Laterality: N/A;  insertion left internal jugular dialysis catheter 23cm  . Umbilcial hernia    . Parathyroidectomy    . Avgg removal Right 11/06/2012    Procedure: REMOVAL OF ARTERIOVENOUS GORETEX GRAFT ([place]);  Surgeon: Mal Misty, MD;  Location: [place];  Service: Vascular;  Laterality: Right;  . Av fistula placement Right 11/06/2012    Procedure: INSERTION OF ARTERIOVENOUS (AV) GORE-TEX GRAFT ARM;  Surgeon: Mal Misty, MD;  Location: [place];  Service: Vascular;  Laterality: Right;  . Insertion of dialysis catheter Right 11/09/2012    Procedure: INSERTION OF DIALYSIS CATHETER;  Surgeon: Conrad [place], MD;  Location: [place];  Service: Vascular;  Laterality: Right;  Right  Femoral Vein  . Venogram N/A 01/06/2014    Procedure: VENOGRAM;  Surgeon: Conrad Byron, MD;  Location: [facility] CATH LAB;  Service: Cardiovascular;  Laterality: N/A;  . Amputation Left 01/20/2015    Procedure: Left Great Toe Amputation;  Surgeon: Newt Minion, MD;  Location: [place];  Service: Orthopedics;  Laterality: Left;  . Amputation Left 03/07/2015    Procedure: Left Foot 1st and 2nd Ray Amputation;  Surgeon: Newt Minion, MD;  Location: [place];  Service: Orthopedics;  Laterality: Left;  . Amputation Left 04/14/2015    Procedure: LEFT BELOW THE KNEE AMPUTATION ;  Surgeon: Newt Minion, MD;  Location: [place];  Service: Orthopedics;  Laterality: Left;  Assessment & Plan Clinical Impression:[name] is a 49 y.o. right handed male with history of hypertension, morbid obesity, end-stage renal disease with hemodialysis, diabetes mellitus and peripheral neuropathy, right ankle fusion Charcot joint 2008, left first and second ray amputations July and August 2016. Lives with spouse independently prior to admission with occasional use of cane. He has 6 steps to enter his 1 story with home with b/l railings at front; 2 steps with left rail at back.  Presented 04/14/2015 with left foot dehiscence and limb was not felt to  be salvageable and underwent left transtibial amputation 04/14/2015 per Dr. Sharol Given. Hospital course pain management. Acute blood loss anemia 7.5-8.9 and monitored as patient continues on Aranesp. Hemodialysis ongoing as per renal services. Physical therapy evaluation completed 04/15/2015 with recommendations of physical medicine rehabilitation consult.. Patient was admitted for a comprehensive rehabilitation program.  Patient transferred to CIR on 04/17/2015 .   Patient currently requires min with mobility secondary to muscle weakness, decreased cardiorespiratoy endurance,   and decreased sitting balance, decreased standing balance, decreased postural control and decreased balance strategies.  Prior to hospitalization, patient was modified independent  with mobility and lived with Spouse, Daughter in a House home.  Home access is 6 step entry through frontStairs to enter.  Patient will benefit from skilled PT intervention to maximize safe functional mobility, minimize fall risk and decrease caregiver burden for planned discharge home with intermittent assist.  Anticipate patient will benefit from follow up [facility] at discharge.  PT - End of Session Activity Tolerance: Tolerates 30+ min activity with multiple rests Endurance Deficit: Yes PT Assessment Rehab Potential (ACUTE/IP ONLY): Good Barriers to Discharge: Decreased caregiver support;Inaccessible home environment PT Patient demonstrates impairments in the following area(s): Balance;Endurance;Motor;Pain PT Transfers Functional Problem(s): Bed Mobility;Bed to Chair;Car;Furniture PT Locomotion Functional Problem(s): Ambulation;Wheelchair Mobility;Stairs PT Plan PT Intensity: Minimum of 1-2 x/day ,45 to 90 minutes PT Frequency: 5 out of 7 days PT Duration Estimated Length of Stay: 8-10 PT Treatment/Interventions: Ambulation/gait training;Community reintegration;Balance/vestibular training;Discharge planning;DME/adaptive equipment  instruction;Patient/family education;Neuromuscular re-education;Functional mobility training;Psychosocial support;Stair training;Therapeutic Activities;Therapeutic Exercise;UE/LE Strength taining/ROM;UE/LE Coordination activities;Visual/perceptual remediation/compensation;Wheelchair propulsion/positioning PT Transfers Anticipated Outcome(s): mod I PT Locomotion Anticipated Outcome(s): mod I PT Recommendation Recommendations for Other Services: Neuropsych consult Follow Up Recommendations: Home health PT Patient destination: Home Equipment Recommended: Wheelchair (measurements);To be determined Equipment Details: w/c measurements TBD, will need L BKA support pad, has RW and crutches   Skilled Therapeutic Intervention Pt received in recliner and agreeable to therapy. Pt drowsy throughout questioning in recliner but improved arousal noted with transfer to w/c.  PT educated pt on role of PT, goals of care, and safety plan.  Session focused on initial assessment of functional mobility, strength, balance, activity tolerance and pt education.  Pt reporting feeling "off balance" and "low" (? Low Hgb) and minimally engaged throughout therapy session, refusing ambulation, stairs, car transfers, or standing activities.  Pt returned to room at end of session propelling w/c x150' and performed ambulatory transfer from w/c>recliner with RW and steady A with verbal cues for hand placement.  Positioned with call bell in reach and needs met.    PT Evaluation Precautions/Restrictions Precautions Precautions: Fall Other Brace/Splint: AFO RLE Restrictions Weight Bearing Restrictions: Yes LLE Weight Bearing: Non weight bearing Pain Pain Assessment Pain Assessment: 0-10 Pain Score: 8  Pain Location: Leg Pain Orientation: Left Pain Intervention(s): Emotional support;RN made aware Home Living/Prior Functioning Home Living Available Help at Discharge: Available PRN/intermittently (Wife watches over 5 kids  throughout the day, but  able to provide supervision if needed) Type of Home: House Home Access: Stairs to enter [employer] of Steps: 6 step entry through front Entrance Stairs-Rails: Right;Left (can't reach both) Home Layout: One level  Lives With: Spouse;Daughter Prior Function Level of Independence: Requires assistive device for independence  Able to Take Stairs?: Yes Driving: No 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) Leisure: Hobbies-yes (Comment) (gardening, basketball)   Cognition Overall Cognitive Status: Within Functional Limits for tasks assessed Arousal/Alertness: Awake/alert Orientation Level: Oriented X4 Sensation Sensation Light Touch: Appears Intact Coordination Gross Motor Movements are Fluid and Coordinated: No Fine Motor Movements are Fluid and Coordinated: Yes Motor  Motor Motor: Abnormal postural alignment and control  Mobility Transfers Transfers: Yes Sit to Stand: 4: Min guard Stand to Sit: 4: Min guard Stand Pivot Transfers: 4: Min guard Locomotion  Ambulation Ambulation: Yes Ambulation/Gait Assistance: 4: Min guard Ambulation Distance (Feet): 5 Feet Assistive device: Rolling walker Gait Gait: Yes Gait Pattern: Trunk flexed Wheelchair Mobility Wheelchair Mobility: Yes Wheelchair Assistance: 5: Investment banker, operational Details: Verbal cues for Marketing executive: Both upper extremities Wheelchair Parts Management: Needs assistance Distance: 160  Trunk/Postural Assessment  Cervical Assessment Cervical Assessment: Exceptions to [facility] (forward head) Thoracic Assessment Thoracic Assessment: Exceptions to [facility] (rounded shoulders) Lumbar Assessment Lumbar Assessment: Exceptions to [facility] (posterior pelvic tilt)  Balance Balance Balance Assessed: Yes Static Sitting Balance Static Sitting - Balance Support: Feet supported;No upper extremity supported Static Sitting - Level of Assistance: 6: Modified  independent (Device/Increase time) Dynamic Sitting Balance Dynamic Sitting - Balance Support: Feet supported;No upper extremity supported Dynamic Sitting - Level of Assistance: 5: Stand by assistance Sitting balance - Comments: donning AFO and R shoe Static Standing Balance Static Standing - Balance Support: Bilateral upper extremity supported Static Standing - Level of Assistance: 4: Min assist Dynamic Standing Balance Dynamic Standing - Balance Support: Bilateral upper extremity supported Dynamic Standing - Level of Assistance: 4: Min assist Dynamic Standing - Balance Activities: Other (comment) Dynamic Standing - Comments: stand pivot transfer Extremity Assessment      RLE Assessment RLE Assessment: Within Functional Limits LLE Strength LLE Overall Strength Comments: hip flexion 4+/5, knee at least 3/5, not tested with resistance 2/2 pain   See Function Navigator for Current Functional Status.   Refer to Care Plan for Long Term Goals  Recommendations for other services: Neuropsych  Discharge Criteria: Patient will be discharged from PT if patient refuses treatment 3 consecutive times without medical reason, if treatment goals not met, if there is a change in medical status, if patient makes no progress towards goals or if patient is discharged from hospital.  The above assessment, treatment plan, treatment alternatives and goals were discussed and mutually agreed upon: by patient  Urban Gibson E Penven-Crew 04/18/2015, 12:22 PM

## 2015-04-18 NOTE — Progress Notes (Signed)
Patient information reviewed and entered into eRehab system by Labrian Torregrossa, RN, CRRN, PPS Coordinator.  Information including medical coding and functional independence measure will be reviewed and updated through discharge.     Per nursing patient was given "Data Collection Information Summary for Patients in Inpatient Rehabilitation Facilities with attached "Privacy Act Statement-Health Care Records" upon admission.  

## 2015-04-19 ENCOUNTER — Inpatient Hospital Stay (HOSPITAL_COMMUNITY): Payer: Medicare Other | Admitting: Physical Therapy

## 2015-04-19 ENCOUNTER — Inpatient Hospital Stay (HOSPITAL_COMMUNITY): Payer: Medicare Other | Admitting: Occupational Therapy

## 2015-04-19 DIAGNOSIS — E1122 Type 2 diabetes mellitus with diabetic chronic kidney disease: Secondary | ICD-10-CM

## 2015-04-19 DIAGNOSIS — D62 Acute posthemorrhagic anemia: Secondary | ICD-10-CM

## 2015-04-19 DIAGNOSIS — E1165 Type 2 diabetes mellitus with hyperglycemia: Secondary | ICD-10-CM

## 2015-04-19 LAB — GLUCOSE, CAPILLARY
GLUCOSE-CAPILLARY: 115 mg/dL — AB (ref 65–99)
GLUCOSE-CAPILLARY: 118 mg/dL — AB (ref 65–99)
Glucose-Capillary: 119 mg/dL — ABNORMAL HIGH (ref 65–99)
Glucose-Capillary: 216 mg/dL — ABNORMAL HIGH (ref 65–99)

## 2015-04-19 LAB — RENAL FUNCTION PANEL
Albumin: 2.8 g/dL — ABNORMAL LOW (ref 3.5–5.0)
Anion gap: 16 — ABNORMAL HIGH (ref 5–15)
BUN: 48 mg/dL — AB (ref 6–20)
CALCIUM: 9.5 mg/dL (ref 8.9–10.3)
CHLORIDE: 89 mmol/L — AB (ref 101–111)
CO2: 30 mmol/L (ref 22–32)
CREATININE: 11.03 mg/dL — AB (ref 0.61–1.24)
GFR calc non Af Amer: 5 mL/min — ABNORMAL LOW (ref >60)
GFR, EST AFRICAN AMERICAN: 5 mL/min — AB (ref >60)
GLUCOSE: 149 mg/dL — AB (ref 65–99)
Phosphorus: 9 mg/dL — ABNORMAL HIGH (ref 2.5–4.6)
Potassium: 4.3 mmol/L (ref 3.5–5.1)
SODIUM: 135 mmol/L (ref 135–145)

## 2015-04-19 LAB — CBC
HCT: 26.4 % — ABNORMAL LOW (ref 39.0–52.0)
Hemoglobin: 7.9 g/dL — ABNORMAL LOW (ref 13.0–17.0)
MCH: 26.4 pg (ref 26.0–34.0)
MCHC: 29.9 g/dL — AB (ref 30.0–36.0)
MCV: 88.3 fL (ref 78.0–100.0)
PLATELETS: 146 10*3/uL — AB (ref 150–400)
RBC: 2.99 MIL/uL — ABNORMAL LOW (ref 4.22–5.81)
RDW: 17.3 % — ABNORMAL HIGH (ref 11.5–15.5)
WBC: 7.7 10*3/uL (ref 4.0–10.5)

## 2015-04-19 LAB — TYPE AND SCREEN
ABO/RH(D): A POS
ANTIBODY SCREEN: NEGATIVE

## 2015-04-19 MED ORDER — PENTAFLUOROPROP-TETRAFLUOROETH EX AERO: 1.0000 "application " | INHALATION_SPRAY | CUTANEOUS | Status: DC | PRN

## 2015-04-19 MED ORDER — HEPARIN SODIUM (PORCINE) 1000 UNIT/ML DIALYSIS
20.0000 [IU]/kg | Freq: Once | INTRAMUSCULAR | Status: AC
  Administered 2015-04-19: 2200 [IU] via INTRAVENOUS_CENTRAL

## 2015-04-19 MED ORDER — PANTOPRAZOLE SODIUM 20 MG PO TBEC
20.0000 mg | DELAYED_RELEASE_TABLET | Freq: Every day | ORAL | Status: DC
  Administered 2015-04-19 – 2015-04-20 (×2): 20 mg via ORAL
  Filled 2015-04-19 (×3): qty 1

## 2015-04-19 MED ORDER — SODIUM CHLORIDE 0.9 % IV SOLN: 100.0000 mL | INTRAVENOUS | Status: DC | PRN

## 2015-04-19 MED ORDER — ALTEPLASE 2 MG IJ SOLR
2.0000 mg | Freq: Once | INTRAMUSCULAR | Status: DC | PRN
  Filled 2015-04-19: qty 2

## 2015-04-19 MED ORDER — HEPARIN SODIUM (PORCINE) 1000 UNIT/ML DIALYSIS: 20.0000 [IU]/kg | Freq: Once | INTRAMUSCULAR | Status: DC

## 2015-04-19 MED ORDER — LIDOCAINE HCL (PF) 1 % IJ SOLN: 5.0000 mL | INTRAMUSCULAR | Status: DC | PRN

## 2015-04-19 MED ORDER — LIDOCAINE-PRILOCAINE 2.5-2.5 % EX CREA
1.0000 "application " | TOPICAL_CREAM | CUTANEOUS | Status: DC | PRN
  Filled 2015-04-19: qty 5

## 2015-04-19 MED ORDER — HEPARIN SODIUM (PORCINE) 1000 UNIT/ML DIALYSIS: 1000.0000 [IU] | INTRAMUSCULAR | Status: DC | PRN

## 2015-04-19 NOTE — Progress Notes (Signed)
Pt said he felt "not right" at bedtime. Fell asleep during assessment/med pass, less conversational than prior pm. Refused multiple offers to get into bed, preferred recliner. Hgb 7.5 on 10/3. HR occasionally tachycardic. Will report to oncoming RN.

## 2015-04-19 NOTE — Progress Notes (Signed)
Pt refusing therapies this am due to feeling tired and wanting to wait until after dialysis. RN contacted Dialysis to attempt to get pt in dialysis as soon as possible. Dialysis RN reported that they will try to get him in around lunch time. PA notified of pt refusal.

## 2015-04-19 NOTE — Progress Notes (Signed)
Physical Therapy Session Note  Patient Details  Name: MAISYN ABRUZZESE MRN: [id number] Date of Birth: June 05, 1966  Today's Date: 04/19/2015  Short Term Goals: Week 1:  PT Short Term Goal 1 (Week 1): STGs=LTGs due to short ELOS  Skilled Therapeutic Interventions/Progress Updates:    Pt on medical hold 2/2 symptomatic anemia. Will f/u tomorrow per MD orders.    Therapy Documentation Precautions:  Precautions Precautions: Fall Required Braces or Orthoses: Other Brace/Splint Other Brace/Splint: AFO RLE Restrictions Weight Bearing Restrictions: Yes LLE Weight Bearing: Non weight bearing General: PT Amount of Missed Time (min): 90 Minutes PT Missed Treatment Reason: MD hold (Comment) (MD hold pending HD and blood transfusion)   See Function Navigator for Current Functional Status.   Therapy/Group: Individual Therapy  Earnest Conroy Penven-Crew 04/19/2015, 3:43 PM

## 2015-04-19 NOTE — Progress Notes (Addendum)
Physical Therapy Session Note  Patient Details  Name: [name] MRN: [id number] Date of Birth: 1966-02-20  Today's Date: 04/19/2015 PT Individual Time: 0900-0905 PT Individual Time Calculation (min): 5 min   Short Term Goals: Week 1:  PT Short Term Goal 1 (Week 1): STGs=LTGs due to short ELOS  Pt refusing PT this AM, stating he is going to "hold off" on therapies today until he gets to dialysis and gets his blood transfusion.  Therapist explained that, per nursing note, dialysis wouldn't be until lunch time and encouraged patient to participate as much as possible this AM.  Therapist explained that patient could take rest breaks as needed, but patient continued to refuse.  Therapist reiterated PT purpose and goals of care, but patient continued to refuse.  Pt left in recliner with call bell in reach and needs met.  RN, PA, and SW made aware of patient's continued refusal to participate 2/2 symptoms of anemia.    Therapy Documentation Precautions:  Precautions Precautions: Fall Required Braces or Orthoses: Other Brace/Splint Other Brace/Splint: AFO RLE Restrictions Weight Bearing Restrictions: Yes LLE Weight Bearing: Non weight bearing General: PT Amount of Missed Time (min): 55 Minutes PT Missed Treatment Reason: Patient with symptomatic anemia, wants to hold off on therapies until after HD and blood transfusion   See Function Navigator for Current Functional Status.   Therapy/Group: Individual Therapy  Truong Delcastillo E Penven-Crew 04/19/2015, 9:21 AM

## 2015-04-19 NOTE — Progress Notes (Signed)
[facility] PHYSICAL MEDICINE & REHABILITATION     PROGRESS NOTE    Subjective/Complaints: Able to sleep, pain controlled. Doesn't feel well---describes it as fatigue, dizziness. Limited participation in therapies as a result..  ROS: Pt denies fever, rash/itching, headache, blurred or double vision, nausea, vomiting, abdominal pain, diarrhea, chest pain, shortness of breath, palpitations, dysuria, dizziness, neck or back pain, bleeding, anxiety, or depression   Objective: Vital Signs: Blood pressure 106/47, pulse 79, temperature 98.2 F (36.8 C), temperature source Oral, resp. rate 18, SpO2 100 %. No results found.  Recent Labs  04/17/15 0849  WBC 6.4  HGB 7.5*  HCT 25.1*  PLT 99*    Recent Labs  04/17/15 0849  NA 137  K 4.2  CL 92*  GLUCOSE 145*  BUN 55*  CREATININE 11.93*  CALCIUM 9.4   CBG (last 3)   Recent Labs  04/18/15 1758 04/18/15 2043 04/19/15 0645  GLUCAP 156* 183* 119*    Wt Readings from Last 3 Encounters:  04/17/15 111 kg (244 lb 11.4 oz)  01/06/14 117.935 kg (260 lb)  12/16/13 125.193 kg (276 lb)    Physical Exam:  Constitutional: no distress. He appears well-developed and well-nourished.  49 year old African-American male  HENT:  Head: Normocephalic and atraumatic.  Eyes: Conjunctivae and EOM are normal.  Neck: Normal range of motion. Neck supple. No thyromegaly present.  Cardiovascular: Normal rate and regular rhythm---no tachy this morning. no murmurs. Respiratory: Effort normal and breath sounds normal.  GI: Soft. Bowel sounds are normal. He exhibits no distension.  Musculoskeletal: Normal range of motion.  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 No blood seen through dressing RUE, LUE, LLE 5/5 strength RLE charcot foot,  Neurological: He is alert and oriented to person, place, and time.  Diminished sensation right foot, charcot deformity.  Skin: Skin is warm and dry.  Left BKA site is dressed appropriately tender. Left foot appears  intact, dry skin Psychiatric: He has a normal mood and affect, perhaps a little flat. His behavior is normal  Assessment/Plan: 1. Functional deficits secondary to left BKA which require 3+ hours per day of interdisciplinary therapy in a comprehensive inpatient rehab setting. Physiatrist is providing close team supervision and 24 hour management of active medical problems listed below. Physiatrist and rehab team continue to assess barriers to discharge/monitor patient progress toward functional and medical goals.  Function:  Bathing Bathing position   Position: Wheelchair/chair at sink  Bathing parts Body parts bathed by patient: Right arm, Left arm, Chest, Abdomen, Front perineal area, Buttocks, Right upper leg, Left upper leg, Right lower leg Body parts bathed by helper: Back  Bathing assist Assist Level: Touching or steadying assistance(Pt > 75%)      Upper Body Dressing/Undressing Upper body dressing   What is the patient wearing?: Pull over shirt/dress     Pull over shirt/dress - Perfomed by patient: Thread/unthread right sleeve, Thread/unthread left sleeve, Put head through opening, Pull shirt over trunk          Upper body assist Assist Level: Set up      Lower Body Dressing/Undressing Lower body dressing   What is the patient wearing?: Pants, Underwear, Socks, AFO, Shoes Underwear - Performed by patient: Thread/unthread right underwear leg, Thread/unthread left underwear leg, Pull underwear up/down   Pants- Performed by patient: Thread/unthread right pants leg, Thread/unthread left pants leg, Pull pants up/down, Fasten/unfasten pants       Socks - Performed by patient: Don/doff right sock   Shoes - Performed by patient:  Don/doff right shoe, Fasten right   AFO - Performed by patient: Don/doff right AFO        Lower body assist Assist Level: Touching or steadying assistance (Pt > 75%)      Toileting Toileting Toileting activity did not occur: No continent  bowel/bladder event (pt does not void - dialysis) Toileting steps completed by patient: Adjust clothing prior to toileting, Performs perineal hygiene, Adjust clothing after toileting   Toileting Assistive Devices: Grab bar or rail  Toileting assist Assist level: Supervision or verbal cues   Transfers Chair/bed transfer   Chair/bed transfer method: Stand pivot Chair/bed transfer assist level: Touching or steadying assistance (Pt > 75%) Chair/bed transfer assistive device: Armrests, Orthosis (AFO)     Locomotion Ambulation           Wheelchair   Type: Manual   Assist Level: Supervision or verbal cues  Cognition Comprehension Comprehension assist level: Understands complex 90% of the time/cues 10% of the time  Expression Expression assist level: Expresses complex 90% of the time/cues < 10% of the time  Social Interaction Social Interaction assist level: Interacts appropriately 90% of the time - Needs monitoring or encouragement for participation or interaction.  Problem Solving Problem solving assist level: Solves basic problems with no assist  Memory Memory assist level: Recognizes or recalls 90% of the time/requires cueing < 10% of the time   Medical Problem List and Plan: 1. Functional deficits secondary to left BKA secondary nonhealing ulcer 04/14/2015  -struggling to participate in therapies due to symptomatic anemia 2. DVT Prophylaxis/Anticoagulation: SCDs right lower extremity. OOB 3. Pain Management: Oxycodone and Robaxin as needed. Monitor with increased mobility 4. End-stage renal disease with hemodialysis. Follow-up per renal services. HD in PM after therapy day 5. Neuropsych: This patient is capable of making decisions on his own behalf. 6. Skin/Wound Care: Routine skin checks 7. Fluids/Electrolytes/Nutrition:   follow-up chemistries in HD 8. Acute on chronic anemia. Continue Aranesp.   -pt is currently symptomatic. H/H today in HD-->transfusion  -hold therapy  pending HD,blood today 9. Diabetes mellitus with peripheral neuropathy. Levemir 20 units daily, NovoLog 3 units 3 times a day, Prandin 0.5 mg twice a day. Sliding scale insulin.   -fair control at present. Follow for pattern  -diabetic teaching 10. Hypertension. Monitor with increased mobility. No current anti-hypertensive medication 11. Charcot right foot. Status post ankle fusion 2008 12. Low grade temp: now afebrile. No signs of infection on exam   LOS (Days) 2 A FACE TO FACE EVALUATION WAS PERFORMED  Charmelle Soh T 04/19/2015 8:52 AM

## 2015-04-19 NOTE — Care Management Note (Signed)
[facility] Individual Statement of Services  Patient Name:  [name]  Date:  04/19/2015  Welcome to the [facility].  Our goal is to provide you with an individualized program based on your diagnosis and situation, designed to meet your specific needs.  With this comprehensive rehabilitation program, you will be expected to participate in at least 3 hours of rehabilitation therapies Monday-Friday, with modified therapy programming on the weekends.  Your rehabilitation program will include the following services:  Physical Therapy (PT), Occupational Therapy (OT), 24 hour per day rehabilitation nursing, Therapeutic Recreaction (TR), Neuropsychology, Case Management (Social Worker), Rehabilitation Medicine, Nutrition Services and Pharmacy Services  Weekly team conferences will be held on Tuesdays to discuss your progress.  Your Social Worker will talk with you frequently to get your input and to update you on team discussions.  Team conferences with you and your family in attendance may also be held.  Expected length of stay: 10-14 days  Overall anticipated outcome: modified independent  Depending on your progress and recovery, your program may change. Your Social Worker will coordinate services and will keep you informed of any changes. Your Social Worker's name and contact numbers are listed  below.  The following services may also be recommended but are not provided by the [facility] will be made to provide these services after discharge if needed.  Arrangements include referral to agencies that provide these services.  Your insurance has been verified to be:  Medicare Your primary doctor is:  Delrae Rend  Pertinent information will be shared with your doctor and your insurance company.  Social Worker:   [place], [place] or ([phone]   Information discussed with and copy given to patient by: Lennart Pall, 04/19/2015, 12:09 PM

## 2015-04-19 NOTE — Progress Notes (Signed)
Social Work Patient ID: [name], male   DOB: 1966/05/29, 49 y.o.   MRN: [id number]   Met with pt today to review team conference.  He is aware and agreeable with targeted d/c of 10/13, however, may need to be adjust dependent on progress.  He is awaiting HD and transfusion today and hopeful this will improve his energy level and ability to do therapies.  He has declined all sessions so far today due to not feeling well.  Will follow closely to ensure he is attending/ participating in txs as scheduled after today.    Jshawn Hurta, LCSW

## 2015-04-19 NOTE — Progress Notes (Signed)
Social Work Patient ID: Everlean Alstrom, male   DOB: Aug 01, 1965, 49 y.o.   MRN: [id number]  Lowella Curb, LCSW Social Worker Signed  Patient Care Conference 04/19/2015 12:20 PM    Expand All Collapse All   Inpatient RehabilitationTeam Conference and Plan of Care Update Date: 04/18/2015   Time: 2:40 PM     Patient Name: [name]       Medical Record Number: [id number]  Date of Birth: 11/17/1965 Sex: Male         Room/Bed: 4M04C/4M04C-01 Payor Info: Payor: MEDICARE / Plan: MEDICARE PART A AND B / Product Type: *No Product type* /    Admitting Diagnosis: new L BKA  HD MWF   Admit Date/Time:  04/17/2015  5:58 PM Admission Comments: No comment available   Primary Diagnosis:  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  Diabetes mellitus type 2, uncontrolled ([place])  04/18/2015   .  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  S/P BKA (below knee amputation) unilateral ([place])  04/14/2015   .  Atherosclerosis of native arteries of the extremities with ulceration(440.23)  08/25/2013   .  ESRD on dialysis ([place])  10/23/2012   .  DIASTOLIC DYSFUNCTION  0000000   .  PVD  10/22/2007   .  ESRD  10/22/2007   .  HYPERPARATHYROIDISM, HX OF  10/22/2007     Expected Discharge Date: Expected Discharge Date: 04/27/15  Team Members Present: Physician leading conference: Dr. Alger Simons Social Worker Present: Lennart Pall, LCSW Nurse Present: Dorien Chihuahua, RN PT Present: Dwyane Dee, Lacy Duverney, PT OT Present: Willeen Cass, Rhetta Mura, OT;Roanna Epley, COTA SLP Present: Weston Anna, SLP PPS Coordinator present : Daiva Nakayama, RN, CRRN        Current Status/Progress  Goal  Weekly Team Focus   Medical     left BKA, ESRD, anemic.   improve balance,  prosthetic education    wound care, pain   Bowel/Bladder     Continent to bowel.Anuric (HEMO M-W-F). LBM 10/4  To continue continent with min. assisst.   To monitor bowel function Q shift.    Swallow/Nutrition/ Hydration               ADL's     set up A for grooming and UB self care, steady A for bathing at sink, steady assist for sit <>stand and LB clothing management and hygiene  Mod I overall with supervision for shower transfer   pt education, functional transfers, self care retraining, strengthening, balance   Mobility     steady A  mod I  transfers, gait, w/c mobility, strengthening, balance    Communication               Safety/Cognition/ Behavioral Observations              Pain     No complain of pain.  To keep pain levels less than 3.on 1 to 10 scale.   To monitor pain level Q 2-3 hrs. and PRN   Skin     Left stump with post surgical dressing.   To keep skin free of infection and breackdown.  To monitor skin Q shift.    Rehab Goals Patient on target to meet rehab goals: Yes *See Care  Plan and progress notes for long and short-term goals.    Barriers to Discharge:  pain control, anemia, somatic complaints      Possible Resolutions to Barriers:   continued medical mgt, positive reinforcement     Discharge Planning/Teaching Needs:   Home with wife and daughter who can provide intermittent assistance.  Wife operates daycare out of their home.        Team Discussion:    New eval today.  Not feeling well today - NV this a.m.  May need transfusion.  Limited participation for evals.  Goals set for mod i overall except stairs and shower.   Revisions to Treatment Plan:    None    Continued Need for Acute Rehabilitation Level of Care: The patient requires daily medical management by a physician with specialized training in physical medicine and rehabilitation for the following conditions: Daily direction of a multidisciplinary physical rehabilitation program to ensure safe treatment while  eliciting the highest outcome that is of practical value to the patient.: Yes Daily medical management of patient stability for increased activity during participation in an intensive rehabilitation regime.: Yes Daily analysis of laboratory values and/or radiology reports with any subsequent need for medication adjustment of medical intervention for : Other;Pulmonary problems  Eleri Ruben 04/19/2015, 12:20 PM

## 2015-04-19 NOTE — Progress Notes (Signed)
Social Work  Social Work Assessment and Plan  Patient Details  Name: [name] MRN: [id number] Date of Birth: 1965/10/23  Today's Date: 04/19/2015  Problem List:  Patient Active Problem List   Diagnosis Date Noted  . Diabetes mellitus type 2, uncontrolled ([place]) 04/18/2015  . 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 S/P BKA (below knee amputation) unilateral ([place]) 04/14/2015  . 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 ESRD on dialysis ([place]) 10/23/2012  . DIASTOLIC DYSFUNCTION 0000000  . PVD 10/22/2007  . ESRD 10/22/2007  . HYPERPARATHYROIDISM, HX OF 10/22/2007   Past Medical History:  Past Medical History  Diagnosis Date  . Hypertension   . Chest pain   . Morbid obesity ([place])   . Bronchitis   . Blood transfusion   . H/O hiatal hernia   . Chronic kidney disease     dialysis, [facility]-F, [facility]  . GERD (gastroesophageal reflux disease)   . Dysrhythmia     irregular heartbeat  . Hyperparathyroidism ([place])   . ESRD (end stage renal disease) on dialysis ([place])     first HD 06/19/04  . Pneumonia   . Diabetes mellitus     type 2  . Anemia    Past Surgical History:  Past Surgical History  Procedure Laterality Date  . Av fistula placement  10/11/2010  . Ankle fusion      rt foot charcot joint 2008 with refusion  . Av fistula placement  07/02/2011    Procedure: INSERTION OF ARTERIOVENOUS (AV) GORE-TEX GRAFT ARM;  Surgeon: Elam Dutch, MD;  Location: [place];  Service: Vascular;  Laterality: Left;  . Thrombectomy    . Insertion of dialysis catheter  01/20/2012    Procedure: INSERTION OF DIALYSIS CATHETER;  Surgeon: Serafina Mitchell, MD;  Location: [place];  Service: Vascular;  Laterality: N/A;  insertion left internal jugular dialysis catheter 23cm  . Umbilcial hernia    . Parathyroidectomy    . Avgg removal Right 11/06/2012    Procedure: REMOVAL OF ARTERIOVENOUS GORETEX GRAFT ([place]);   Surgeon: Mal Misty, MD;  Location: [place];  Service: Vascular;  Laterality: Right;  . Av fistula placement Right 11/06/2012    Procedure: INSERTION OF ARTERIOVENOUS (AV) GORE-TEX GRAFT ARM;  Surgeon: Mal Misty, MD;  Location: [place];  Service: Vascular;  Laterality: Right;  . Insertion of dialysis catheter Right 11/09/2012    Procedure: INSERTION OF DIALYSIS CATHETER;  Surgeon: Conrad Manheim, MD;  Location: [place];  Service: Vascular;  Laterality: Right;  Right  Femoral Vein  . Venogram N/A 01/06/2014    Procedure: VENOGRAM;  Surgeon: Conrad Coalfield, MD;  Location[facility] C[facility] LAB;  Service: Cardiovascular;  Laterality: N/A;  . Amputation Left 01/20/2015    Procedure: Left Great Toe Amputation;  Surgeon: Newt Minion, MD;  Location: [place];  Service: Orthopedics;  Laterality: Left;  . Amputation Left 03/07/2015    Procedure: Left Foot 1st and 2nd Ray Amputation;  Surgeon: Newt Minion, MD;  Location: [place];  Service: Orthopedics;  Laterality: Left;  . Amputation Left 04/14/2015    Procedure: LEFT BELOW THE KNEE AMPUTATION ;  Surgeon: Newt Minion, MD;  Location: [place];  Service: Orthopedics;  Laterality: Left;   Social History:  reports that he quit smoking about 3 months ago. His smoking use included Cigarettes. He has a 2 pack-year smoking history. He has never used smokeless tobacco.  He reports that he does not drink alcohol or use illicit drugs.  Family / Support Systems Marital Status: Married Patient Roles: Spouse, Parent Spouse/Significant Other: wife, April Sayre @ (H) [phone] or [phone] Children: son, Treydon Lozier @ [phone] and a daughter - both local Anticipated Caregiver: wife, April Dewoody Ability/Limitations of Caregiver: wife has an in home day care: cares for 5 children. Pt. states she can supervise him as much as needed Caregiver Availability: Intermittent Family Dynamics: Pt reports that his wife is supporitve, however, does have her daycare to attend to.  Neice  present during interview and adds that she can provide some support and that pt's children also able to assist.  Social History Preferred language: English Religion: None Cultural Background: NA Education: HS Read: Yes Write: Yes Employment Status: Disabled Date Retired/Disabled/Unemployed: 66 yrs Freight forwarder Issues: None Guardian/Conservator: None - per MD, pt capable of making decisions on his own behalf   Abuse/Neglect Physical Abuse: Denies Verbal Abuse: Denies Sexual Abuse: Denies Exploitation of patient/patient's resources: Denies Self-Neglect: Denies  Emotional Status Pt's affect, behavior adn adjustment status: Pt appears lethargic and reports that his "blood is low..." and believes this is reason he "...has no energy for therapies...".  He quickly denies any emotional distress regarding his BKA.  Will monitor and refer for peer visit or neuropsychology consult if warranted.  Formal depression screen not indicated at this time. Recent Psychosocial Issues: Chronic medical care needs of Left leg for several months. Pyschiatric History: None Substance Abuse History: None  Patient / Family Perceptions, Expectations & Goals Pt/Family understanding of illness & functional limitations: Pt with basic understanding of the medical issues that resulted in need for BKA.  Basic awareness of his current functional limitations and need for CIR, however, feels he cannot get full benefit "...until I can get some blood..." Premorbid pt/family roles/activities: Pt was independent PTA and even driving himself to HD.  Denies any limitations and states,  "I am somebody who stays going all the time." Anticipated changes in roles/activities/participation: limited change anticipated if pt able to reach mod i goals as wife was providing prn assistance PTA Pt/family expectations/goals: Pt states immedicate goal is to address in "low blood" and then feels he will be ready for CIR and to  reach mod i [place]: Other (Comment) (HD Center) Premorbid Home Care/DME Agencies: Other (Comment) ([facility] several yrs ago) Transportation available at discharge: yes Resource referrals recommended: Support group (specify)  Discharge Planning Living Arrangements: Spouse/significant other, Children Support Systems: Spouse/significant other, Children, Other relatives, Friends/neighbors Type of Residence: Private residence Insurance Resources: Chartered certified accountant Resources: [employer] Screen Referred: No Living Expenses: Higher education careers adviser Management: Patient Does the patient have any problems obtaining your medications?: No Home Management: wife, dtr and pt Patient/Family Preliminary Plans: Pt to return home with wife and daughter who can provide intermittent support Social Work Anticipated Follow Up Needs: HH/OP Expected length of stay: 10-14 days  Clinical Impression Pleasant gentleman here following BKA.  Verbalizes he is eager to begin therapies, however, reports that he is "low blood" and does not feel he has the energy he needs yet.  He denies any s/s of any emotional distress overall or in relation to his BKA - will monitor.  Good family support, however, only intermittent (wife operates in home daycare.)  Following for support and d/c planning needs.  Josel Keo 04/19/2015, 12:06 PM

## 2015-04-19 NOTE — Patient Care Conference (Signed)
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 Diabetes mellitus type 2, uncontrolled ([place]) 04/18/2015  . 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 S/P BKA (below knee amputation) unilateral ([place]) 04/14/2015  . 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 ESRD on dialysis ([place]) 10/23/2012  . DIASTOLIC DYSFUNCTION 0000000  . PVD 10/22/2007  . ESRD 10/22/2007  . HYPERPARATHYROIDISM, HX OF 10/22/2007    Expected Discharge Date: Expected Discharge Date: 04/27/15  Team Members Present: Physician leading conference: Dr. Alger Simons Social Worker Present: Lennart Pall, LCSW Nurse Present: Dorien Chihuahua, RN PT Present: Dwyane Dee, Lacy Duverney, PT OT Present: Willeen Cass, Rhetta Mura, OT;Roanna Epley, COTA SLP Present: Weston Anna, SLP PPS Coordinator present : Daiva Nakayama, RN, CRRN     Current Status/Progress Goal Weekly Team Focus  Medical   left BKA, ESRD, anemic.   improve balance,  prosthetic education  wound care, pain   Bowel/Bladder   Continent to bowel.Anuric (HEMO M-W-F). LBM 10/4  To continue continent with min. assisst.  To monitor bowel function Q shift.   Swallow/Nutrition/ Hydration             ADL's   set up A for grooming and UB self  care, steady A for bathing at sink, steady assist for sit <>stand and LB clothing management and hygiene  Mod I overall with supervision for shower transfer  pt education, functional transfers, self care retraining, strengthening, balance   Mobility   steady A  mod I  transfers, gait, w/c mobility, strengthening, balance   Communication             Safety/Cognition/ Behavioral Observations            Pain   No complain of pain.  To keep pain levels less than 3.on 1 to 10 scale.  To monitor pain level Q 2-3 hrs. and PRN   Skin   Left stump with post surgical dressing.  To keep skin free of infection and breackdown.  To monitor skin Q shift.    Rehab Goals Patient on target to meet rehab goals: Yes *See Care Plan and progress notes for long and short-term goals.  Barriers to Discharge: pain control, anemia, somatic complaints    Possible Resolutions to Barriers:  continued medical mgt, positive reinforcement    Discharge Planning/Teaching Needs:  Home with wife and daughter who can provide intermittent assistance.  Wife operates daycare out of their home.      Team Discussion:  New eval today.  Not feeling well today - NV this a.m.  May need transfusion.  Limited participation for evals.  Goals set for mod i overall except stairs and shower.  Revisions to Treatment Plan:  None   Continued Need for Acute  Rehabilitation Level of Care: The patient requires daily medical management by a physician with specialized training in physical medicine and rehabilitation for the following conditions: Daily direction of a multidisciplinary physical rehabilitation program to ensure safe treatment while eliciting the highest outcome that is of practical value to the patient.: Yes Daily medical management of patient stability for increased activity during participation in an intensive rehabilitation regime.: Yes Daily analysis of laboratory values and/or radiology reports with any subsequent need  for medication adjustment of medical intervention for : Other;Pulmonary problems  [name] 04/19/2015, 12:20 PM

## 2015-04-19 NOTE — Progress Notes (Signed)
Occupational Therapy Session Note  Patient Details  Name: [name] MRN: [id number] Date of Birth: 28-Nov-1965  Today's Date: 04/19/2015 OT Individual Time: 0800-0810 OT Individual Time Calculation (min): 10 min  and Today's Date: 04/19/2015 OT Missed Time: 50 Minutes Missed Time Reason: Patient unwilling/refused to participate without medical reason   Short Term Goals: Week 1:  OT Short Term Goal 1 (Week 1): STGs=LTGs secondary to estimated short LOS  Skilled Therapeutic Interventions/Progress Updates:   Upon entering the room, pt seated in recliner chair sleeping. Pt slept in recliner chair and reports, "I still don't feel good. I am not doing therapy until I go to dialysis and get my blood transfusion." OT educated pt on importance of OT intervention, OT purpose, and goals. Pt continues to refuse even with maximal encouragement to participate. Pt stating, " I am not doing any of this today." Pt remained seated in recliner chair with call bell and all needed items within reach upon exiting the room.   Therapy Documentation Precautions:  Precautions Precautions: Fall Required Braces or Orthoses: Other Brace/Splint Other Brace/Splint: AFO RLE Restrictions Weight Bearing Restrictions: Yes LLE Weight Bearing: Non weight bearing General: General OT Amount of Missed Time: 50 Minutes Vital Signs: Therapy Vitals Temp: 98.2 F (36.8 C) Temp Source: Oral Pulse Rate: 79 Resp: 18 BP: (!) 106/47 mmHg Patient Position (if appropriate): Lying Oxygen Therapy SpO2: 100 % O2 Device: Not Delivered  See Function Navigator for Current Functional Status.   Therapy/Group: Individual Therapy  [name] 04/19/2015, 8:19 AM

## 2015-04-20 ENCOUNTER — Inpatient Hospital Stay (HOSPITAL_COMMUNITY): Payer: Medicare Other | Admitting: Physical Therapy

## 2015-04-20 ENCOUNTER — Inpatient Hospital Stay (HOSPITAL_COMMUNITY): Payer: Medicare Other | Admitting: Occupational Therapy

## 2015-04-20 DIAGNOSIS — Z794 Long term (current) use of insulin: Secondary | ICD-10-CM

## 2015-04-20 DIAGNOSIS — E1101 Type 2 diabetes mellitus with hyperosmolarity with coma: Secondary | ICD-10-CM

## 2015-04-20 LAB — GLUCOSE, CAPILLARY
GLUCOSE-CAPILLARY: 117 mg/dL — AB (ref 65–99)
GLUCOSE-CAPILLARY: 114 mg/dL — AB (ref 65–99)
Glucose-Capillary: 137 mg/dL — ABNORMAL HIGH (ref 65–99)
Glucose-Capillary: 91 mg/dL (ref 65–99)

## 2015-04-20 LAB — CBC
HCT: 29.2 % — ABNORMAL LOW (ref 39.0–52.0)
HEMOGLOBIN: 8.5 g/dL — AB (ref 13.0–17.0)
MCH: 26.3 pg (ref 26.0–34.0)
MCHC: 29.1 g/dL — ABNORMAL LOW (ref 30.0–36.0)
MCV: 90.4 fL (ref 78.0–100.0)
Platelets: 174 10*3/uL (ref 150–400)
RBC: 3.23 MIL/uL — AB (ref 4.22–5.81)
RDW: 17.4 % — ABNORMAL HIGH (ref 11.5–15.5)
WBC: 7.5 10*3/uL (ref 4.0–10.5)

## 2015-04-20 MED ORDER — SODIUM CHLORIDE 0.9 % IV BOLUS (SEPSIS)
500.0000 mL | Freq: Once | INTRAVENOUS | Status: AC
  Administered 2015-04-20: 500 mL via INTRAVENOUS

## 2015-04-20 MED ORDER — CALCIUM CARBONATE ANTACID 500 MG PO CHEW
1.0000 | CHEWABLE_TABLET | Freq: Two times a day (BID) | ORAL | Status: DC
  Administered 2015-04-20 – 2015-04-22 (×5): 200 mg via ORAL
  Filled 2015-04-20 (×6): qty 1

## 2015-04-20 NOTE — IPOC Note (Signed)
Overall Plan of Care [facility]) Patient Details Name: [name] MRN: [id number] DOB: 29-Feb-1966  Admitting Diagnosis: new L BKA  HD MWF  Hospital Problems: Principal Problem:   Status post below knee amputation of left lower extremity (HCC) Active Problems:   ESRD   Diabetes mellitus type 2, uncontrolled ([place])     Functional Problem List: Nursing Bowel, Edema, Endurance, Medication Management, Motor, Bladder, Nutrition, Perception, Pain, Safety, Skin Integrity  PT Balance, Endurance, Motor, Pain  OT Balance, Endurance, Motor, Safety, Pain  SLP    TR         Basic ADL's: OT Grooming, Bathing, Dressing, Toileting     Advanced  ADL's: OT  (n/a)     Transfers: PT Bed Mobility, Bed to Chair, Car, Manufacturing systems engineer, Metallurgist: PT Ambulation, Emergency planning/management officer, Stairs     Additional Impairments: OT None  SLP        TR      Anticipated Outcomes Item Anticipated Outcome  Self Feeding n/a  Swallowing      Basic self-care  Mod I   Toileting  Mod I    Bathroom Transfers Mod I - toilet, shower- supervision  Bowel/Bladder  Pt will remain continent of bowel and bladder with min assist while in rehab.   Transfers  mod I  Locomotion  mod I  Communication     Cognition     Pain  Pt will manage pain at 4 or less on a scale of 0-10.   Safety/Judgment  Pt will remain free of falls and injury with min assist while in rehab.    Therapy Plan: PT Intensity: Minimum of 1-2 x/day ,45 to 90 minutes PT Frequency: 5 out of 7 days PT Duration Estimated Length of Stay: 8-10 OT Intensity: Minimum of 1-2 x/day, 45 to 90 minutes OT Frequency: 5 out of 7 days OT Duration/Estimated Length of Stay: 8-10 days         Team Interventions: Nursing Interventions Bladder Management, Patient/Family Education, Bowel Management, Disease Management/Prevention, Pain Management, Medication Management, Skin Care/Wound Management, Discharge Planning  PT  interventions Ambulation/gait training, Community reintegration, Training and development officer, Discharge planning, DME/adaptive equipment instruction, Patient/family education, Neuromuscular re-education, Functional mobility training, Psychosocial support, Stair training, Therapeutic Activities, Therapeutic Exercise, UE/LE Strength taining/ROM, UE/LE Coordination activities, Visual/perceptual remediation/compensation, Wheelchair propulsion/positioning  OT Interventions Training and development officer, Academic librarian, Barrister's clerk education, Self Care/advanced ADL retraining, Therapeutic Exercise, UE/LE Coordination activities, Wheelchair propulsion/positioning, Discharge planning, DME/adaptive equipment instruction, Functional mobility training, Pain management, Therapeutic Activities, UE/LE Strength taining/ROM  SLP Interventions    TR Interventions    SW/CM Interventions Discharge Planning, Psychosocial Support, Patient/Family Education    Team Discharge Planning: Destination: PT-Home ,OT- Home , SLP-  Projected Follow-up: PT-Home health PT, OT-  Home health OT, SLP-  Projected Equipment Needs: PT-Wheelchair (measurements), To be determined, OT- Tub/shower bench, Other (comment) (drop arm commode chair), SLP-  Equipment Details: PT-w/c measurements TBD, will need L BKA support pad, has RW and crutches , OT-  Patient/family involved in discharge planning: PT- Patient,  OT-Patient, SLP-   MD ELOS: 10-14d Medical Rehab Prognosis:  Good Assessment: 49 y.o. right handed male with history of hypertension, morbid obesity, end-stage renal disease with hemodialysis, diabetes mellitus and peripheral neuropathy, right ankle fusion Charcot joint 2008, left first and second ray amputations July and August 2016. Lives with spouse independently prior to admission with occasional use of cane. He has 6 steps to enter his 1 story with home with b/l  railings. Presented 04/14/2015 with left foot dehiscence and  limb was not felt to be salvageable and underwent left transtibial amputation 04/14/2015 per Dr. Sharol Given   Now requiring 24/7 Rehab RN,MD, as well as CIR level PT, OT .  Treatment team will focus on ADLs and mobility with goals set at Mod I  See Team Conference Notes for weekly updates to the plan of care

## 2015-04-20 NOTE — Progress Notes (Signed)
Occupational Therapy Session Note  Patient Details  Name: [name] MRN: [id number] Date of Birth: [birth year]  Today's Date: 04/20/2015 OT Individual Time: 0700-0755 OT Individual Time Calculation (min): 55 min    Short Term Goals: Week 1:  OT Short Term Goal 1 (Week 1): STGs=LTGs secondary to estimated short LOS  Skilled Therapeutic Interventions/Progress Updates:  Upon entering the room, pt supine in bed with no c/o pain. Pt reporting he feels better after having dialysis treatment yesterday. Pt donned R sock while sitting EOB with steady assist for balance. Stand pivot transfer with use of RW and steady assist into wheelchair. Pt refusing bathing and dressing this session. Pt seated in wheelchair at sink side for grooming tasks and he did remove pull over shirt for UB bathing while seated. Pt declined shower as he states, " My wife is going to bring me more clothing and I just don't feel like it today." OT continues to educate pt on OT purpose and goals for discharge home with pt verbalizing understanding. OT continues to provide pt with education in regards to energy conservation with self care as well as pt reporting fatigue at end of session. Pt transferred into recliner chair with stand pivot transfer and no use of AD with steady assist. B LE's elevated. Call bell and all needed items within reach upon exiting the room.   Therapy Documentation Precautions:  Precautions Precautions: Fall Required Braces or Orthoses: Other Brace/Splint Other Brace/Splint: AFO RLE Restrictions Weight Bearing Restrictions: Yes LLE Weight Bearing: Non weight bearing Vital Signs: Therapy Vitals Temp: 98.4 F (36.9 C) Temp Source: Oral Pulse Rate: 86 Resp: 18 BP: 111/69 mmHg Patient Position (if appropriate): Lying Oxygen Therapy SpO2: 99 % O2 Device: Not Delivered  See Function Navigator for Current Functional Status.   Therapy/Group: Individual Therapy  [name] 04/20/2015,  8:01 AM

## 2015-04-20 NOTE — Progress Notes (Signed)
Subjective:   Feels better today. Had some lightheaded and weakness yesterday that prevented him from completing therapy. No issues with HD last night.  Objective Filed Vitals:   04/19/15 1700 04/19/15 1720 04/20/15 0453 04/20/15 0645  BP: 108/39 101/37 97/41 111/69  Pulse: 88 93 94 86  Temp:  98.4 F (36.9 C) 98.4 F (36.9 C)   TempSrc:   Oral   Resp:  20 18   Weight:  110 kg (242 lb 8.1 oz)    SpO2:  100% 99%    Physical Exam General: alert and oriented, in wheelchair doing therapy Heart: RRR  Lungs: CTA, unlabored  Abdomen: soft, nontender +BS  Extremities: L BKA dressing, R leg brace. No edema  Dialysis Access:  L Hero +B/t   Dialysis Orders: [facility] 4.5 hr - -rarely runs full time EDW 116- leaving a little below 2 K 2.25 Ca 450/800 HERO heparin 5000 with 2500 mid Mircera 150 last 9/21 no Fe calcitriol 2.5 Recent labs; Hgb 8.6 25% sat ferritin 1300 (July) iPTH 216 Ca upper 9s P 6-8   Assessment/Plan: 1. nonhealing foot wound- L BKA Dr. Sharol Given - now in rehab 2. ESRD - MWF - next HD pending tomorrow.  3. Hypertension/volume - hypotensive. not on BP meds; volume control only - re-establish edw- post HD wt last night 110 kgs- minimal volume removal in HD tomorrow 4. Anemia - Hgb 7.9 recheck CBC today - inc ESA, cont Fe bolus. transfuse prn 5. Metabolic bone disease - Chronically elevated phosphorus iPTH 216- takes 4 phoslo and 2 velphoro 6. Nutrition - renal diet and multivit. Alb 2.7- says not eating well here 7. Thrombocytopenia - follow platelets. 99 yesterday 8. DM - per primary  Shelle Iron, NP [place] [phone] 04/20/2015,12:17 PM  LOS: 3 days   Pt seen, examined and agree w A/P as above. Looks a little dry with low BP's.  No fluid off w HD tomorrow, let volume and BP come up as he is having symptoms.  Kelly Splinter MD pager [phone]    cell [phone] 04/20/2015, 1:34 PM    Additional Objective Labs: Basic Metabolic  Panel:  Recent Labs Lab 04/14/15 1930 04/17/15 0849 04/19/15 1330  NA 137 137 135  K 3.8 4.2 4.3  CL 93* 92* 89*  CO2 32 30 30  GLUCOSE 172* 145* 149*  BUN 31* 55* 48*  CREATININE 8.06* 11.93* 11.03*  CALCIUM 9.3 9.4 9.5  PHOS 6.6* 9.0* 9.0*   Liver Function Tests:  Recent Labs Lab 04/14/15 1253 04/14/15 1930 04/17/15 0849 04/19/15 1330  AST 11*  --   --   --   ALT 7*  --   --   --   ALKPHOS 49  --   --   --   BILITOT 0.6  --   --   --   PROT 7.0  --   --   --   ALBUMIN 3.1* 3.1* 2.7* 2.8*   No results for input(s): LIPASE, AMYLASE in the last 168 hours. CBC:  Recent Labs Lab 04/14/15 1253 04/14/15 1930 04/17/15 0849 04/19/15 1416  WBC 7.6 8.3 6.4 7.7  HGB 8.6* 8.9* 7.5* 7.9*  HCT 29.0* 28.8* 25.1* 26.4*  MCV 90.6 89.4 89.0 88.3  PLT 123* 127* 99* 146*   Blood Culture    Component Value Date/Time   SDES WOUND EXIT SITE ARM RIGHT 11/06/2012 1820   SDES GRAFT ARM RIGHT 11/06/2012 1820   SDES WOUND EXIT SITE ARM RIGHT 11/06/2012  [place]  11/06/2012 1820    PATIENT ON FOLLOWING VANCOMYCIN INFECTED AV GRAFT SWAB   SPECREQUEST PATIENT ON FOLLOWING VANCOMYCIN AV GRAFT 11/06/2012 1820   SPECREQUEST  11/06/2012 1820    PATIENT ON FOLLOWING VANCOMYCIN INFECTED AV GRAFT SWAB   CULT  11/06/2012 1820    MODERATE METHICILLIN RESISTANT STAPHYLOCOCCUS AUREUS Note: RIFAMPIN AND GENTAMICIN SHOULD NOT BE USED AS SINGLE DRUGS FOR TREATMENT OF STAPH INFECTIONS. This organism DOES NOT demonstrate inducible Clindamycin resistance in vitro. CRITICAL RESULT CALLED TO, READ BACK BY AND VERIFIED WITH: JAMIE Y 4/28 @900   BY REAMM   CULT  11/06/2012 1820    ABUNDANT STAPHYLOCOCCUS AUREUS Note: SUSCEPTIBILITIES PERFORMED ON PREVIOUS CULTURE WITHIN THE LAST 5 DAYS.   CULT NO ANAEROBES ISOLATED 11/06/2012 1820   REPTSTATUS 11/09/2012 FINAL 11/06/2012 1820   REPTSTATUS 11/10/2012 FINAL 11/06/2012 1820   REPTSTATUS 11/11/2012 FINAL 11/06/2012 1820    Cardiac  Enzymes: No results for input(s): CKTOTAL, CKMB, CKMBINDEX, TROPONINI in the last 168 hours. CBG:  Recent Labs Lab 04/19/15 1138 04/19/15 1807 04/19/15 2105 04/20/15 0643 04/20/15 1126  GLUCAP 115* 118* 216* 137* 91   Iron Studies: No results for input(s): IRON, TIBC, TRANSFERRIN, FERRITIN in the last 72 hours. @lablastinr3 @ Studies/Results: No results found. Medications:   . aspirin EC  325 mg Oral Daily  . calcitRIOL  2.5 mcg Oral Q M,W,F-HD  . calcium acetate  2,668 mg Oral TID WC  . [START ON 04/24/2015] darbepoetin (ARANESP) injection - DIALYSIS  200 mcg Intravenous Q Mon-HD  . docusate sodium  100 mg Oral BID  . ferric gluconate (FERRLECIT/NULECIT) IV  125 mg Intravenous Q M,W,F-HD  . insulin aspart  0-9 Units Subcutaneous TID WC  . insulin aspart  3 Units Subcutaneous TID WC  . insulin detemir  20 Units Subcutaneous QHS  . pantoprazole  20 mg Oral Daily  . repaglinide  0.5 mg Oral BID AC

## 2015-04-20 NOTE — Progress Notes (Signed)
Occupational Therapy Session Note  Patient Details  Name: [name] MRN: [id number] Date of Birth: [birth year]  Today's Date: 04/20/2015 OT Individual Time: 9166-0600 OT Individual Time Calculation (min): 60 min    Short Term Goals: Week 1:  OT Short Term Goal 1 (Week 1): STGs=LTGs secondary to estimated short LOS  Skilled Therapeutic Interventions/Progress Updates:    Pt seen this session for functional mobility training and activity tolerance exercises. Pt received in recliner, he donned R foot brace without A and stood from recliner with steadying A to RW and took several steps to w/c. Pt adjusted his L leg on chair. In hallway, pt met with his nephrology PA for a few minutes. He then propelled w/c to gym and used RW to take 10 steps to mat with contact guard. On mat, pt engaged in strengthening ex with 5# dowel bar of shoulder presses and chest presses 15 reps for 4 sets with cuing for form/ techniques. He also did forward/backward rowing 10x, 3 sets Pt did need breaks between each set as he was feeling fatigued.  Pt worked on dynamic sitting balance/ core strengthening of lateral reaching with hip hikes and boxing jabs.   Sit to stand from elevated mat to RW close S, ambulated back to w/c and propelled self to room. Close S with standing from w/c with RW to recliner. Pt able to adjust leg on elevated recliner leg rest and resting in chair with all needs in place.  Therapy Documentation Precautions:  Precautions Precautions: Fall Required Braces or Orthoses: Other Brace/Splint Other Brace/Splint: AFO RLE Restrictions Weight Bearing Restrictions: Yes LLE Weight Bearing: Non weight bearing      Pain: Pain Assessment Pain Assessment: 0-10 Pain Score: 5  Pain Location: Leg Pain Orientation: Left Pain Descriptors / Indicators: Aching Pain Onset: On-going Pain Intervention(s): Rest (pt did not want pain medication) ADL:  See Function Navigator for Current Functional  Status.   Therapy/Group: Individual Therapy  [place] 04/20/2015, 12:16 PM

## 2015-04-20 NOTE — Progress Notes (Signed)
Pt had one episode of vomiting today. Pt vomited large amount of brown emesis. Pt denies anti-nausea medication. Pt given warm tea and pt resting in bed at this time.

## 2015-04-20 NOTE — Progress Notes (Signed)
Physical Therapy Session Note  Patient Details  Name: [name] MRN: [id number] Date of Birth: Aug 05, 1965  Today's Date: 04/20/2015 PT Individual Time: [phone] and 1505-1530 PT Individual Time Calculation (min): 57 min and 25 min   Short Term Goals: Week 1:  PT Short Term Goal 1 (Week 1): STGs=LTGs due to short ELOS  Skilled Therapeutic Interventions/Progress Updates:   Session 1: Pt received in recliner and agreeable to therapy session. Session focus on safe functional transfers, gait training, w/c mobility and parts management, stair negotiation, balance, and strengthening.  Pt performs sit<>stand and stand pivot transfers with RW and steady assist with verbal cues for hand placement during sitting.  Pt amb x22' with RW and steady assist, stating the velcro on his AFO is coming undone so he can't ambulate further.  Pt perform BLE therex x20 reps of hip flexion, LAQ, hip abd with level 2 TB, and hip add with ball.  PT instructs patient in 4 step negotiation with 2 hand rails and mod A to lift onto steps during ascent.  Pt performs community level w/c mobility with supervision and verbal cues for technique and management of w/c parts.  Pt returned to room at end of session and positioned in recliner with call bell in reach and needs met.    Session 2: Pt received in recliner with RN present, reporting patient had just vomited.  Pt refusing to attempt car transfer or other mobility this afternoon but agreeable to patient education and discharge problem solving.  PT provided patient education on use of double upright AFO for all out of bed mobility and assisted patient with phone call to [employer] for potential replacement of velcro on calf strap.  PT provided pt education and discharge problem solving with DME. Pt reports he would prefer to only use a RW at d/c as he feels like he won't need a w/c in the house.  PT provided education on energy conservation using RW versus crutches and use of  w/c; education to continue.  Pt provided with crackers and warm tea for upset stomach, left in recliner with call bell in reach and needs met.   Therapy Documentation Precautions:  Precautions Precautions: Fall Required Braces or Orthoses: Other Brace/Splint Other Brace/Splint: AFO RLE Restrictions Weight Bearing Restrictions: Yes LLE Weight Bearing: Non weight bearing Pain: Pain Assessment Pain Assessment: No/denies pain Mobility: Transfers Transfers: Yes Sit to Stand: 4: Min guard Stand to Sit: 4: Min guard Stand Pivot Transfers: 4: Min guard Locomotion : Ambulation Ambulation: Yes Ambulation/Gait Assistance: 4: Min guard Ambulation Distance (Feet): 22 Feet Assistive device: Rolling walker Gait Gait: Yes Gait Pattern: Impaired Gait Pattern: Poor foot clearance - right Gait velocity: slow Stairs / Additional Locomotion Stairs: Yes Stairs Assistance: 3: Mod assist Stairs Assistance Details: Verbal cues for technique;Verbal cues for sequencing;Manual facilitation for weight shifting Stair Management Technique: Two rails Number of Stairs: 4 Height of Stairs: 6 Wheelchair Mobility Wheelchair Mobility: Yes Wheelchair Assistance: 5: Careers information officer: Both upper extremities Wheelchair Parts Management: Needs assistance Distance: 160  Trunk/Postural Assessment : Cervical Assessment Cervical Assessment: Exceptions to [facility] (forward head posture) Thoracic Assessment Thoracic Assessment: Exceptions to [facility] (rounded shoulders) Lumbar Assessment Lumbar Assessment: Exceptions to [facility] (anterior pelvic tilt) Postural Control Postural Control: Within Functional Limits  Balance: Balance Balance Assessed: Yes Static Sitting Balance Static Sitting - Balance Support: Feet unsupported;No upper extremity supported Static Sitting - Level of Assistance: 6: Modified independent (Device/Increase time) Dynamic Sitting Balance Dynamic Sitting - Balance Support: Feet  unsupported;No upper extremity supported Dynamic Sitting - Level of Assistance: 6: Modified independent (Device/Increase time) Sitting balance - Comments: donning AFO while sitting in recliner and ball toss Static Standing Balance Static Standing - Balance Support: Bilateral upper extremity supported Static Standing - Level of Assistance: 5: Stand by assistance Dynamic Standing Balance Dynamic Standing - Balance Support: Left upper extremity supported;Right upper extremity supported Dynamic Standing - Level of Assistance: 4: Min assist Dynamic Standing - Balance Activities: Ball toss Dynamic Standing - Comments: shooting basketball with R and L hand    See Function Navigator for Current Functional Status.   Therapy/Group: Individual Therapy  Janeen Watson E Penven-Crew 04/20/2015, 10:01 AM

## 2015-04-20 NOTE — Plan of Care (Signed)
Problem: RH BOWEL ELIMINATION Goal: RH STG MANAGE BOWEL W/MEDICATION W/ASSISTANCE STG Manage Bowel with Medication with [place].  Outcome: Not Progressing Pt refusing colace at this time. He says he does not need it because he is going regularly on his own.

## 2015-04-20 NOTE — Progress Notes (Signed)
[place] PHYSICAL MEDICINE & REHABILITATION     PROGRESS NOTE    Subjective/Complaints: Pt feeling much better after HD yesterday. Apparently didn't get blood (i don't see it ordered and there are no notes in chart in regard to it).  ROS: Pt denies fever, rash/itching, headache, blurred or double vision, nausea, vomiting, abdominal pain, diarrhea, chest pain, shortness of breath, palpitations, dysuria, dizziness, neck or back pain, bleeding, anxiety, or depression   Objective: Vital Signs: Blood pressure 111/69, pulse 86, temperature 98.4 F (36.9 C), temperature source Oral, resp. rate 18, weight 110 kg (242 lb 8.1 oz), SpO2 99 %. No results found.  Recent Labs  04/17/15 0849 04/19/15 1416  WBC 6.4 7.7  HGB 7.5* 7.9*  HCT 25.1* 26.4*  PLT 99* 146*    Recent Labs  04/17/15 0849 04/19/15 1330  NA 137 135  K 4.2 4.3  CL 92* 89*  GLUCOSE 145* 149*  BUN 55* 48*  CREATININE 11.93* 11.03*  CALCIUM 9.4 9.5   CBG (last 3)   Recent Labs  04/19/15 1807 04/19/15 2105 04/20/15 0643  GLUCAP 118* 216* 137*    Wt Readings from Last 3 Encounters:  04/19/15 110 kg (242 lb 8.1 oz)  04/17/15 111 kg (244 lb 11.4 oz)  01/06/14 117.935 kg (260 lb)    Physical Exam:  Constitutional: more alret, sitting in chair  49 year old African-American male  HENT:  Head: Normocephalic and atraumatic.  Eyes: Conjunctivae and EOM are normal.  Neck: Normal range of motion. Neck supple. No thyromegaly present.  Cardiovascular: Normal rate and regular rhythm---no tachy this morning. no murmurs. Respiratory: Effort normal and breath sounds normal.  GI: Soft. Bowel sounds are normal. He exhibits no distension.  Musculoskeletal: Normal range of motion.  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 No blood seen through dressing RUE, LUE, LLE 5/5 strength RLE charcot foot,  Neurological: He is alert and oriented to person, place, and time.  Diminished sensation right foot, charcot deformity  present.  Skin: Skin is warm and dry.  Left BKA site is dressed appropriately tender. Left foot appears intact, dry skin Psychiatric: He has a normal mood and affect, affect more dynamic  Assessment/Plan: 1. Functional deficits secondary to left BKA which require 3+ hours per day of interdisciplinary therapy in a comprehensive inpatient rehab setting. Physiatrist is providing close team supervision and 24 hour management of active medical problems listed below. Physiatrist and rehab team continue to assess barriers to discharge/monitor patient progress toward functional and medical goals.  Function:  Bathing Bathing position   Position: Wheelchair/chair at sink  Bathing parts Body parts bathed by patient: Right arm, Left arm, Chest, Abdomen (UB only) Body parts bathed by helper: Back  Bathing assist Assist Level: Set up      Upper Body Dressing/Undressing Upper body dressing   What is the patient wearing?: Pull over shirt/dress     Pull over shirt/dress - Perfomed by patient: Thread/unthread right sleeve, Thread/unthread left sleeve, Put head through opening, Pull shirt over trunk          Upper body assist Assist Level: More than reasonable time      Lower Body Dressing/Undressing Lower body dressing Lower body dressing/undressing activity did not occur: Refused What is the patient wearing?: Pants, Underwear, Socks, AFO, Shoes Underwear - Performed by patient: Thread/unthread right underwear leg, Thread/unthread left underwear leg, Pull underwear up/down   Pants- Performed by patient: Thread/unthread right pants leg, Thread/unthread left pants leg, Pull pants up/down, Fasten/unfasten pants  Socks - Performed by patient: Don/doff right sock   Shoes - Performed by patient: Don/doff right shoe, Fasten right   AFO - Performed by patient: Don/doff right AFO        Lower body assist Assist for lower body dressing: Touching or steadying assistance (Pt > 75%)       Toileting Toileting Toileting activity did not occur: No continent bowel/bladder event (pt does not void - dialysis) Toileting steps completed by patient: Adjust clothing prior to toileting, Performs perineal hygiene, Adjust clothing after toileting   Toileting Assistive Devices: Grab bar or rail  Toileting assist Assist level: Supervision or verbal cues   Transfers Chair/bed transfer   Chair/bed transfer method: Stand pivot Chair/bed transfer assist level: Touching or steadying assistance (Pt > 75%) Chair/bed transfer assistive device: Armrests     Locomotion Ambulation           Wheelchair   Type: Manual   Assist Level: Supervision or verbal cues  Cognition Comprehension Comprehension assist level: Understands complex 90% of the time/cues 10% of the time  Expression Expression assist level: Expresses complex 90% of the time/cues < 10% of the time  Social Interaction Social Interaction assist level: Interacts appropriately 90% of the time - Needs monitoring or encouragement for participation or interaction.  Problem Solving Problem solving assist level: Solves basic problems with no assist  Memory Memory assist level: Recognizes or recalls 90% of the time/requires cueing < 10% of the time   Medical Problem List and Plan: 1. Functional deficits secondary to left BKA secondary nonhealing ulcer 04/14/2015  -struggling to participate in therapies due to symptomatic anemia 2. DVT Prophylaxis/Anticoagulation: SCDs right lower extremity. OOB 3. Pain Management: Oxycodone and Robaxin as needed. Monitor with increased mobility 4. End-stage renal disease with hemodialysis. Follow-up per renal services. HD in PM after therapy day 5. Neuropsych: This patient is capable of making decisions on his own behalf. 6. Skin/Wound Care: Routine skin checks 7. Fluids/Electrolytes/Nutrition:   follow-up chemistries with HD 8. Acute on chronic anemia. Continue Aranesp.   -no blood given  apparently in HD (hgb up to 7.9)---pt feeling better today  -txfsn, supps per nephrology 9. Diabetes mellitus with peripheral neuropathy. Levemir 20 units daily, NovoLog 3 units 3 times a day, Prandin 0.5 mg twice a day. Sliding scale insulin.   -fair control at present. Follow for pattern  -diabetic education 10. Hypertension. Monitor with increased mobility. No current anti-hypertensive medication 11. Charcot right foot. Status post ankle fusion 2008 12. Low grade temp: now afebrile. No signs of infection on exam   LOS (Days) 3 A FACE TO FACE EVALUATION WAS PERFORMED  SWARTZ,ZACHARY T 04/20/2015 8:25 AM

## 2015-04-21 ENCOUNTER — Inpatient Hospital Stay (HOSPITAL_COMMUNITY): Payer: Medicare Other | Admitting: Occupational Therapy

## 2015-04-21 ENCOUNTER — Inpatient Hospital Stay (HOSPITAL_COMMUNITY): Payer: Medicare Other | Admitting: Physical Therapy

## 2015-04-21 DIAGNOSIS — Z89512 Acquired absence of left leg below knee: Secondary | ICD-10-CM

## 2015-04-21 LAB — CBC
HEMATOCRIT: 26.6 % — AB (ref 39.0–52.0)
Hemoglobin: 7.8 g/dL — ABNORMAL LOW (ref 13.0–17.0)
MCH: 26.2 pg (ref 26.0–34.0)
MCHC: 29.3 g/dL — ABNORMAL LOW (ref 30.0–36.0)
MCV: 89.3 fL (ref 78.0–100.0)
Platelets: 165 10*3/uL (ref 150–400)
RBC: 2.98 MIL/uL — ABNORMAL LOW (ref 4.22–5.81)
RDW: 17.3 % — AB (ref 11.5–15.5)
WBC: 5.7 10*3/uL (ref 4.0–10.5)

## 2015-04-21 LAB — RENAL FUNCTION PANEL
Albumin: 2.9 g/dL — ABNORMAL LOW (ref 3.5–5.0)
Anion gap: 14 (ref 5–15)
BUN: 44 mg/dL — AB (ref 6–20)
CHLORIDE: 92 mmol/L — AB (ref 101–111)
CO2: 34 mmol/L — AB (ref 22–32)
Calcium: 9.9 mg/dL (ref 8.9–10.3)
Creatinine, Ser: 10.97 mg/dL — ABNORMAL HIGH (ref 0.61–1.24)
GFR calc Af Amer: 5 mL/min — ABNORMAL LOW (ref >60)
GFR, EST NON AFRICAN AMERICAN: 5 mL/min — AB (ref >60)
GLUCOSE: 181 mg/dL — AB (ref 65–99)
POTASSIUM: 3.7 mmol/L (ref 3.5–5.1)
Phosphorus: 8.1 mg/dL — ABNORMAL HIGH (ref 2.5–4.6)
Sodium: 140 mmol/L (ref 135–145)

## 2015-04-21 LAB — GLUCOSE, CAPILLARY
GLUCOSE-CAPILLARY: 132 mg/dL — AB (ref 65–99)
GLUCOSE-CAPILLARY: 107 mg/dL — AB (ref 65–99)

## 2015-04-21 MED ORDER — ALTEPLASE 2 MG IJ SOLR
2.0000 mg | Freq: Once | INTRAMUSCULAR | Status: DC | PRN
  Filled 2015-04-21: qty 2

## 2015-04-21 MED ORDER — PANTOPRAZOLE SODIUM 40 MG PO TBEC
40.0000 mg | DELAYED_RELEASE_TABLET | Freq: Two times a day (BID) | ORAL | Status: DC
  Administered 2015-04-21 – 2015-04-25 (×9): 40 mg via ORAL
  Filled 2015-04-21 (×13): qty 1

## 2015-04-21 MED ORDER — PENTAFLUOROPROP-TETRAFLUOROETH EX AERO: 1.0000 "application " | INHALATION_SPRAY | CUTANEOUS | Status: DC | PRN

## 2015-04-21 MED ORDER — GI COCKTAIL ~~LOC~~: 30.0000 mL | Freq: Two times a day (BID) | ORAL | Status: DC | PRN

## 2015-04-21 MED ORDER — LIDOCAINE HCL (PF) 1 % IJ SOLN: 5.0000 mL | INTRAMUSCULAR | Status: DC | PRN

## 2015-04-21 MED ORDER — HEPARIN SODIUM (PORCINE) 1000 UNIT/ML DIALYSIS
1000.0000 [IU] | INTRAMUSCULAR | Status: DC | PRN
  Filled 2015-04-21: qty 1

## 2015-04-21 MED ORDER — GI COCKTAIL ~~LOC~~
30.0000 mL | Freq: Two times a day (BID) | ORAL | Status: DC | PRN
  Administered 2015-04-22: 30 mL via ORAL
  Filled 2015-04-21 (×3): qty 30

## 2015-04-21 MED ORDER — HEPARIN SODIUM (PORCINE) 1000 UNIT/ML DIALYSIS
5000.0000 [IU] | Freq: Once | INTRAMUSCULAR | Status: DC
  Filled 2015-04-21: qty 5

## 2015-04-21 MED ORDER — PANTOPRAZOLE SODIUM 20 MG PO TBEC
20.0000 mg | DELAYED_RELEASE_TABLET | Freq: Two times a day (BID) | ORAL | Status: DC
  Filled 2015-04-21 (×2): qty 1

## 2015-04-21 MED ORDER — LIDOCAINE-PRILOCAINE 2.5-2.5 % EX CREA
1.0000 "application " | TOPICAL_CREAM | CUTANEOUS | Status: DC | PRN
  Filled 2015-04-21: qty 5

## 2015-04-21 MED ORDER — SODIUM CHLORIDE 0.9 % IV SOLN: 100.0000 mL | INTRAVENOUS | Status: DC | PRN

## 2015-04-21 NOTE — Progress Notes (Addendum)
[place] PHYSICAL MEDICINE & REHABILITATION     PROGRESS NOTE    Subjective/Complaints: Feeling well this morning until because nauseas and vomited moments later. Refused to do therapy until i returned to room.. Able to stand at sink. Had an episode of vomiting yesterday afternoon also. Reports some RLQ discomfort.  ROS: Pt denies fever, rash/itching, headache, blurred or double vision, nausea, vomiting, abdominal pain, diarrhea, chest pain, shortness of breath, palpitations, dysuria, dizziness, neck or back pain, bleeding, anxiety, or depression   Objective: Vital Signs: Blood pressure 148/77, pulse 88, temperature 98.6 F (37 C), temperature source Oral, resp. rate 17, weight 111.9 kg (246 lb 11.1 oz), SpO2 96 %. No results found.  Recent Labs  04/19/15 1416 04/20/15 1209  WBC 7.7 7.5  HGB 7.9* 8.5*  HCT 26.4* 29.2*  PLT 146* 174    Recent Labs  04/19/15 1330  NA 135  K 4.3  CL 89*  GLUCOSE 149*  BUN 48*  CREATININE 11.03*  CALCIUM 9.5   CBG (last 3)   Recent Labs  04/20/15 1626 04/20/15 2115 04/21/15 0654  GLUCAP 117* 114* 132*    Wt Readings from Last 3 Encounters:  04/21/15 111.9 kg (246 lb 11.1 oz)  04/17/15 111 kg (244 lb 11.4 oz)  01/06/14 117.935 kg (260 lb)    Physical Exam:  Constitutional: no distress  HENT:  Head: Normocephalic and atraumatic.  Eyes: Conjunctivae and EOM are normal.  Neck: Normal range of motion. Neck supple. No thyromegaly present.  Cardiovascular: Normal rate and regular rhythm---no tachy this morning. no murmurs. Respiratory: Effort normal and breath sounds normal.  GI: Soft. Bowel sounds are normal. He exhibits no distension. Mild epigastric pain with palpation Musculoskeletal: Normal range of motion.  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 No blood seen through dressing RUE, LUE, LLE 5/5 strength RLE charcot foot,  Neurological: He is alert and oriented to person, place, and time.  Diminished sensation right foot,  charcot deformity present.  Skin: Skin is warm and dry.  Left BKA site is dressed with coban. Left foot appears intact, dry skin Psychiatric: He has a normal mood and affect, affect more dynamic  Assessment/Plan: 1. Functional deficits secondary to left BKA which require 3+ hours per day of interdisciplinary therapy in a comprehensive inpatient rehab setting. Physiatrist is providing close team supervision and 24 hour management of active medical problems listed below. Physiatrist and rehab team continue to assess barriers to discharge/monitor patient progress toward functional and medical goals.  Function:  Bathing Bathing position   Position: Wheelchair/chair at sink  Bathing parts Body parts bathed by patient: Right arm, Left arm, Chest, Abdomen (UB only) Body parts bathed by helper: Back  Bathing assist Assist Level: Set up      Upper Body Dressing/Undressing Upper body dressing   What is the patient wearing?: Pull over shirt/dress     Pull over shirt/dress - Perfomed by patient: Thread/unthread right sleeve, Thread/unthread left sleeve, Put head through opening, Pull shirt over trunk          Upper body assist Assist Level: More than reasonable time      Lower Body Dressing/Undressing Lower body dressing Lower body dressing/undressing activity did not occur: Refused What is the patient wearing?: Pants, Underwear, Socks, AFO, Shoes Underwear - Performed by patient: Thread/unthread right underwear leg, Thread/unthread left underwear leg, Pull underwear up/down   Pants- Performed by patient: Thread/unthread right pants leg, Thread/unthread left pants leg, Pull pants up/down, Fasten/unfasten pants  Socks - Performed by patient: Don/doff right sock   Shoes - Performed by patient: Don/doff right shoe, Fasten right   AFO - Performed by patient: Don/doff right AFO        Lower body assist Assist for lower body dressing: Touching or steadying assistance (Pt > 75%)       Toileting Toileting Toileting activity did not occur: No continent bowel/bladder event (Dialysis, no voids) Toileting steps completed by patient: Adjust clothing prior to toileting, Performs perineal hygiene, Adjust clothing after toileting   Toileting Assistive Devices: Grab bar or rail  Toileting assist Assist level: Supervision or verbal cues   Transfers Chair/bed transfer   Chair/bed transfer method: Ambulatory Chair/bed transfer assist level: Touching or steadying assistance (Pt > 75%) Chair/bed transfer assistive device: Environmental consultant, Orthosis, Armrests     Locomotion Ambulation     Max distance: 22 Assist level: Touching or steadying assistance (Pt > 75%)   Wheelchair   Type: Manual Max wheelchair distance: 300 Assist Level: Supervision or verbal cues  Cognition Comprehension Comprehension assist level: Understands complex 90% of the time/cues 10% of the time  Expression Expression assist level: Expresses basic 90% of the time/requires cueing < 10% of the time.  Social Interaction Social Interaction assist level: Interacts appropriately 90% of the time - Needs monitoring or encouragement for participation or interaction.  Problem Solving Problem solving assist level: Solves basic 90% of the time/requires cueing < 10% of the time  Memory Memory assist level: Recognizes or recalls 90% of the time/requires cueing < 10% of the time   Medical Problem List and Plan: 1. Functional deficits secondary to left BKA secondary nonhealing ulcer 04/14/2015  -struggling to participate in therapies due to symptomatic anemia 2. DVT Prophylaxis/Anticoagulation: SCDs right lower extremity. OOB 3. Pain Management: Oxycodone and Robaxin as needed. Monitor with increased mobility 4. End-stage renal disease with hemodialysis. Follow-up per renal services. HD in PM after therapy day 5. Neuropsych: This patient is capable of making decisions on his own behalf. 6. Skin/Wound Care: Routine skin  checks 7. Fluids/Electrolytes/Nutrition:   follow-up chemistries with HD 8. Acute on chronic anemia. Continue Aranesp.   -Fe bolus,   -txfsn prn  9. Diabetes mellitus with peripheral neuropathy. Levemir 20 units daily, NovoLog 3 units 3 times a day, Prandin 0.5 mg twice a day. Sliding scale insulin.   -reasonable control yesterday. No changes  -diabetic education 10. Hypertension. Monitor with increased mobility. No current anti-hypertensive medication 11. Charcot right foot. Status post ankle fusion 2008 12. GI: vomiting,nausea  -check vomitus, stool for OB  -increase protonix to 20mg  bid  -prn zofran  -labs today with HD,  -adjustment of renal/meds per nephrology discretion  LOS (Days) 4 A FACE TO FACE EVALUATION WAS PERFORMED  Marcedes Tech T 04/21/2015 8:51 AM

## 2015-04-21 NOTE — Progress Notes (Signed)
Physical Therapy Note  Patient Details  Name: [name] MRN: [id number] Date of Birth: 09/14/1965 Today's Date: 04/21/2015  Patient missed 60 min skilled physical therapy due to episode of vomiting with OT this AM and continued N/V with participation in therapy and requested to speak with MD. RN and MD notified.   Carney Living A 04/21/2015, 10:29 AM

## 2015-04-21 NOTE — Progress Notes (Signed)
Occupational Therapy Session Note  Patient Details  Name: [name] MRN: [id number] Date of Birth: June 25, 1966  Today's Date: 04/21/2015 OT Individual Time:  -   0800-9000  (60 min)  1st session                                           1300-1430  (90 min)   2nd session      Short Term Goals: Week 1:  OT Short Term Goal 1 (Week 1): STGs=LTGs secondary to estimated short LOS  Skilled Therapeutic Interventions/Progress Updates:    1st session:  Skilled OT intervention with treatment focus on the following:  Transfers from bed to wc, bed mobility, sit to stand.  Pt was SBA with bed mobility, transfers to wc, and sit to stand at sink.  Pt.did bathing and dressing with assistance only with back.   Pt started havie severe bouts of nausea and vomiting after oral care and tongue scrapping.  IV came out of hand.  Informed RN.  .                                 2nd session:  Pt sitting in recliner upon OT arrival.   Nursing giving medicines.  He reported one of the pills for his kidneys is not agreeing with his stomach.  He reported having a bad taste in his mouth since he took the pill.  He refused to take the other 3 pills.  Pt. Plans to call wife and let her  Bring in the kidney pills like he takes at home.  Pt. Needed increased time for movements because he started feeling nauseated again.  He donned his right leg brace.  Used RW to ambulate to the wc.    Propelled wc to atrium.  Ambulated x2 with min assist on sidewalk with min assist for 25 feet each time.  Pt propelled wc back to room and transferred to recliner with SBA.   Therapy Documentation Precautions:  Precautions Precautions: Fall Required Braces or Orthoses: Other Brace/Splint Other Brace/Splint: AFO RLE Restrictions Weight Bearing Restrictions: Yes LLE Weight Bearing: Non weight bearing     Pain:  None  1st session            5/10  Left knee   2nd session           See Function Navigator for Current Functional  Status.   Therapy/Group: Individual Therapy  Lisa Roca 04/21/2015, 7:41 AM

## 2015-04-22 ENCOUNTER — Inpatient Hospital Stay (HOSPITAL_COMMUNITY): Payer: Medicare Other | Admitting: Occupational Therapy

## 2015-04-22 ENCOUNTER — Inpatient Hospital Stay (HOSPITAL_COMMUNITY): Payer: Medicare Other | Admitting: Physical Therapy

## 2015-04-22 LAB — GLUCOSE, CAPILLARY
GLUCOSE-CAPILLARY: 143 mg/dL — AB (ref 65–99)
GLUCOSE-CAPILLARY: 94 mg/dL (ref 65–99)
Glucose-Capillary: 130 mg/dL — ABNORMAL HIGH (ref 65–99)
Glucose-Capillary: 125 mg/dL — ABNORMAL HIGH (ref 65–99)

## 2015-04-22 LAB — CBC
HCT: 29.8 % — ABNORMAL LOW (ref 39.0–52.0)
HEMOGLOBIN: 8.7 g/dL — AB (ref 13.0–17.0)
MCH: 26.5 pg (ref 26.0–34.0)
MCHC: 29.2 g/dL — ABNORMAL LOW (ref 30.0–36.0)
MCV: 90.9 fL (ref 78.0–100.0)
Platelets: 161 10*3/uL (ref 150–400)
RBC: 3.28 MIL/uL — AB (ref 4.22–5.81)
RDW: 17.2 % — ABNORMAL HIGH (ref 11.5–15.5)
WBC: 6.4 10*3/uL (ref 4.0–10.5)

## 2015-04-22 LAB — OCCULT BLOOD X 1 CARD TO LAB, STOOL: FECAL OCCULT BLD: POSITIVE — AB

## 2015-04-22 MED ORDER — FAMOTIDINE 20 MG PO TABS: 20.0000 mg | ORAL_TABLET | Freq: Two times a day (BID) | ORAL | Status: DC

## 2015-04-22 MED ORDER — FAMOTIDINE 20 MG PO TABS
20.0000 mg | ORAL_TABLET | Freq: Once | ORAL | Status: AC
  Administered 2015-04-22: 20 mg via ORAL
  Filled 2015-04-22: qty 1

## 2015-04-22 MED ORDER — FAMOTIDINE 20 MG PO TABS
20.0000 mg | ORAL_TABLET | Freq: Every day | ORAL | Status: DC
  Administered 2015-04-23 – 2015-04-25 (×3): 20 mg via ORAL
  Filled 2015-04-22 (×3): qty 1

## 2015-04-22 MED ORDER — POLYETHYLENE GLYCOL 3350 17 G PO PACK: 17.0000 g | PACK | Freq: Every day | ORAL | Status: DC | PRN

## 2015-04-22 NOTE — Progress Notes (Addendum)
Subjective:   Reflux much improved this am, some constitpation-start miralax  Objective Filed Vitals:   04/21/15 1905 04/21/15 1925 04/21/15 2045 04/22/15 0555  BP: 152/64 114/45 131/49 115/57  Pulse: 92 97 101 93  Temp:   98 F (36.7 C) 98 F (36.7 C)  TempSrc:   Oral Oral  Resp:  16 16 18   Weight:    111.041 kg (244 lb 12.8 oz)  SpO2:   100% 100%   Physical Exam General: alert and oriented- no acute distress.  In wheelchair, family visiting Heart: RRR Lungs: CTA, unlabored  Abdomen: soft, nontender +BS Extremities: L BKA dressing intact. No edema  Dialysis Access:  L HERO +  Dialysis Orders: [facility] 4.5 hr - -rarely runs full time EDW 116- leaving a little below 2 K 2.25 Ca 450/800 HERO heparin 5000 with 2500 mid Mircera 150 last 9/21 no Fe calcitriol 2.5 Recent labs; Hgb 8.6 25% sat ferritin 1300 (July) iPTH 216 Ca upper 9s P 6-8  Assessment/Plan: 1. nonhealing foot wound- L BKA Dr. Sharol Given - now in rehab 2. ESRD - MWF - next HD next on monday 3. Hypertension/volume - hypotensive. not on BP meds; volume control only - re-establish edw-  4. Anemia - Hgb 7.8- hgb fluctuating- Heme + stools recheck CBC today - inc ESA, cont Fe bolus. transfuse prn 5. Metabolic bone disease - Chronically elevated phosphorus iPTH 216- takes 4 phoslo and 2 velphoro 6. Nutrition - renal diet and multivit. Alb 2.9- appetite improving 7. Thrombocytopenia - follow platelets. 135 yesterday 8. DM - per primary  Shelle Iron, NP [place] [phone] 04/22/2015,11:12 AM  LOS: 5 days   Pt seen, examined and agree w A/P as above.  Kelly Splinter MD [facility] pager [phone]    cell [phone] 04/23/2015, 10:37 AM    Additional Objective Labs: Basic Metabolic Panel:  Recent Labs Lab 04/17/15 0849 04/19/15 1330 04/21/15 1533  NA 137 135 140  K 4.2 4.3 3.7  CL 92* 89* 92*  CO2 30 30 34*  GLUCOSE 145* 149* 181*  BUN 55* 48* 44*  CREATININE 11.93*  11.03* 10.97*  CALCIUM 9.4 9.5 9.9  PHOS 9.0* 9.0* 8.1*   Liver Function Tests:  Recent Labs Lab 04/17/15 0849 04/19/15 1330 04/21/15 1533  ALBUMIN 2.7* 2.8* 2.9*   No results for input(s): LIPASE, AMYLASE in the last 168 hours. CBC:  Recent Labs Lab 04/17/15 0849 04/19/15 1416 04/20/15 1209 04/21/15 1533  WBC 6.4 7.7 7.5 5.7  HGB 7.5* 7.9* 8.5* 7.8*  HCT 25.1* 26.4* 29.2* 26.6*  MCV 89.0 88.3 90.4 89.3  PLT 99* 146* 174 165   Blood Culture    Component Value Date/Time   SDES WOUND EXIT SITE ARM RIGHT 11/06/2012 1820   SDES GRAFT ARM RIGHT 11/06/2012 1820   SDES WOUND EXIT SITE ARM RIGHT 11/06/2012 1820   SPECREQUEST  11/06/2012 1820    PATIENT ON FOLLOWING VANCOMYCIN INFECTED AV GRAFT SWAB   SPECREQUEST PATIENT ON FOLLOWING VANCOMYCIN AV GRAFT 11/06/2012 1820   SPECREQUEST  11/06/2012 1820    PATIENT ON FOLLOWING VANCOMYCIN INFECTED AV GRAFT SWAB   CULT  11/06/2012 1820    MODERATE METHICILLIN RESISTANT STAPHYLOCOCCUS AUREUS Note: RIFAMPIN AND GENTAMICIN SHOULD NOT BE USED AS SINGLE DRUGS FOR TREATMENT OF STAPH INFECTIONS. This organism DOES NOT demonstrate inducible Clindamycin resistance in vitro. CRITICAL RESULT CALLED TO, READ BACK BY AND VERIFIED WITH: JAMIE Y 4/28 @900   BY REAMM   CULT  11/06/2012 1820  ABUNDANT STAPHYLOCOCCUS AUREUS Note: SUSCEPTIBILITIES PERFORMED ON PREVIOUS CULTURE WITHIN THE LAST 5 DAYS.   CULT NO ANAEROBES ISOLATED 11/06/2012 1820   REPTSTATUS 11/09/2012 FINAL 11/06/2012 1820   REPTSTATUS 11/10/2012 FINAL 11/06/2012 1820   REPTSTATUS 11/11/2012 FINAL 11/06/2012 1820    Cardiac Enzymes: No results for input(s): CKTOTAL, CKMB, CKMBINDEX, TROPONINI in the last 168 hours. CBG:  Recent Labs Lab 04/20/15 1626 04/20/15 2115 04/21/15 0654 04/21/15 1127 04/22/15 0645  GLUCAP 117* 114* 132* 107* 143*   Iron Studies: No results for input(s): IRON, TIBC, TRANSFERRIN, FERRITIN in the last 72  hours. @lablastinr3 @ Studies/Results: No results found. Medications:   . aspirin EC  325 mg Oral Daily  . calcitRIOL  2.5 mcg Oral Q M,W,F-HD  . calcium acetate  2,668 mg Oral TID WC  . calcium carbonate  1 tablet Oral BID  . [START ON 04/24/2015] darbepoetin (ARANESP) injection - DIALYSIS  200 mcg Intravenous Q Mon-HD  . docusate sodium  100 mg Oral BID  . ferric gluconate (FERRLECIT/NULECIT) IV  125 mg Intravenous Q M,W,F-HD  . insulin aspart  0-9 Units Subcutaneous TID WC  . insulin aspart  3 Units Subcutaneous TID WC  . insulin detemir  20 Units Subcutaneous QHS  . pantoprazole  40 mg Oral BID  . repaglinide  0.5 mg Oral BID AC

## 2015-04-22 NOTE — Progress Notes (Signed)
Occupational Therapy Session Note  Patient Details  Name: [name] MRN: [id number] Date of Birth: 07-23-1965  Today's Date: 04/22/2015 OT Individual Time: 0730-0830 OT Individual Time Calculation (min): 60 min    Short Term Goals: Week 1:  OT Short Term Goal 1 (Week 1): STGs=LTGs secondary to estimated short LOS  Skilled Therapeutic Interventions/Progress Updates: Patient stated he is waiting for [employer] professional to come in (estimated [employer] on Wednes nxt week) to repair or replace right leg brace as when he ambulates he states his right knee "pops" possibly out of place.   He also reported that he has bouts of reflux and "something comes up and my throat burns."  Otherwise, this morning, he finished breakfast independently when OT staff member was present  And then completed a supervised stand pivot transfer from the edge of his bed to his w/c on his right. Bathing in w/c at sink focused on safety washing right foot sitting in w/c and leaing forward and standing to wash periarea stabilizing on sink without losing balance.   He was able to do so with good balance.       Therapy Documentation Precautions:  Precautions Precautions: Fall Required Braces or Orthoses: Other Brace/Splint Other Brace/Splint: AFO RLE Restrictions Weight Bearing Restrictions: Yes LLE Weight Bearing: Non weight bearing  Pain:denied   See Function Navigator for Current Functional Status.   Therapy/Group: Individual Therapy  Alfredia Ferguson [facility] 04/22/2015, 8:07 AM

## 2015-04-22 NOTE — Progress Notes (Signed)
Occupational Therapy Session Note  Patient Details  Name: [name] MRN: [id number] Date of Birth: 01/27/1966  Today's Date: 04/22/2015 OT Individual Time:  -   1430-1545  (75 min)      Short Term Goals: Week 1:  OT Short Term Goal 1 (Week 1): STGs=LTGs secondary to estimated short LOS      Skilled Therapeutic Interventions/Progress Updates:    Engaged in simple kitchen activity at walker and wc level.  Used walker to retrieve items; used wc for stirring and simmering tasks.  Pt reached into cabinets with SBA.  He demonstrated safe use of wc and walker in the kitchen with no problems.    Therapy Documentation Precautions:  Precautions Precautions: Fall Required Braces or Orthoses: Other Brace/Splint Other Brace/Splint: AFO RLE Restrictions Weight Bearing Restrictions: Yes LLE Weight Bearing: Non weight bearing      Pain:4/10 heartburn           See Function Navigator for Current Functional Status.   Therapy/Group: Individual Therapy  Lisa Roca 04/22/2015, 3:48 PM

## 2015-04-22 NOTE — Progress Notes (Signed)
[name] is a 49 y.o. male 1965/11/20 [id number]  Subjective: No new complaints. No new problems. Slept well. Feeling OK.  Objective: Vital signs in last 24 hours: Temp:  [97.8 F (36.6 C)-98.1 F (36.7 C)] 98 F (36.7 C) (10/08 0555) Pulse Rate:  [90-103] 93 (10/08 0555) Resp:  [16-18] 18 (10/08 0555) BP: (84-152)/(33-99) 115/57 mmHg (10/08 0555) SpO2:  [97 %-100 %] 100 % (10/08 0555) Weight:  [225 lb 12 oz (102.4 kg)-244 lb 12.8 oz (111.041 kg)] 244 lb 12.8 oz (111.041 kg) (10/08 0555) Weight change: -20 lb 15.1 oz (-9.5 kg) Last BM Date: 04/20/15  Intake/Output from previous day: 10/07 0701 - 10/08 0700 In: 240 [P.O.:240] Out: 800  Last cbgs: CBG (last 3)   Recent Labs  04/21/15 0654 04/21/15 1127 04/22/15 0645  GLUCAP 132* 107* 143*     Physical Exam General: No apparent distress   HEENT: not dry Lungs: Normal effort. Lungs clear to auscultation, no crackles or wheezes. Cardiovascular: Regular rate and rhythm, no edema Abdomen: S/NT/ND; BS(+) Musculoskeletal:  unchanged Neurological: No new neurological deficits Wounds: dressed L amputee   Skin: clear  Aging changes Mental state: Alert, oriented, cooperative    Lab Results: BMET    Component Value Date/Time   NA 140 04/21/2015 1533   NA 135* 02/10/2014 1319   K 3.7 04/21/2015 1533   K 4.6 02/17/2014 1353   CL 92* 04/21/2015 1533   CL 99 02/10/2014 1319   CO2 34* 04/21/2015 1533   CO2 25 02/10/2014 1319   GLUCOSE 181* 04/21/2015 1533   GLUCOSE 205* 02/10/2014 1319   BUN 44* 04/21/2015 1533   BUN 40* 02/10/2014 1319   CREATININE 10.97* 04/21/2015 1533   CREATININE 11.58* 02/10/2014 1319   CALCIUM 9.9 04/21/2015 1533   CALCIUM 8.4* 02/10/2014 1319   GFRNONAA 5* 04/21/2015 1533   GFRNONAA 5* 02/10/2014 1319   GFRAA 5* 04/21/2015 1533   GFRAA 5* 02/10/2014 1319   CBC    Component Value Date/Time   WBC 5.7 04/21/2015 1533   WBC 8.1 02/10/2014 1319   RBC 2.98* 04/21/2015 1533   RBC  3.86* 02/10/2014 1319   HGB 7.8* 04/21/2015 1533   HGB 11.4* 02/10/2014 1319   HCT 26.6* 04/21/2015 1533   HCT 36.3* 02/10/2014 1319   PLT 165 04/21/2015 1533   PLT 140* 02/10/2014 1319   MCV 89.3 04/21/2015 1533   MCV 94 02/10/2014 1319   MCH 26.2 04/21/2015 1533   MCH 29.4 02/10/2014 1319   MCHC 29.3* 04/21/2015 1533   MCHC 31.3* 02/10/2014 1319   RDW 17.3* 04/21/2015 1533   RDW 16.7* 02/10/2014 1319   LYMPHSABS 1.8 02/10/2014 1319   LYMPHSABS 1.5 11/06/2012 1407   MONOABS 0.8 02/10/2014 1319   MONOABS 0.8 11/06/2012 1407   EOSABS 0.6 02/10/2014 1319   EOSABS 0.2 11/06/2012 1407   BASOSABS 0.1 02/10/2014 1319   BASOSABS 0.0 11/06/2012 1407    Studies/Results: No results found.  Medications: I have reviewed the patient's current medications.  Assessment/Plan:  1. Functional deficits secondary to left BKA secondary nonhealing ulcer 04/14/2015 -struggling to participate in therapies due to symptomatic anemia 2. DVT Prophylaxis/Anticoagulation: SCDs right lower extremity. OOB 3. Pain Management: Oxycodone and Robaxin as needed. Monitor with increased mobility 4. End-stage renal disease with hemodialysis. Follow-up per renal services. HD in PM after therapy day 5. Neuropsych: This patient is capable of making decisions on his own behalf. 6. Skin/Wound Care: Routine skin checks 7. Fluids/Electrolytes/Nutrition: follow-up chemistries with  HD 8. Acute on chronic anemia. Continue Aranesp.  -Fe bolus,  -txfsn prn  9. Diabetes mellitus with peripheral neuropathy. Levemir 20 units daily, NovoLog 3 units 3 times a day, Prandin 0.5 mg twice a day. Sliding scale insulin.  -reasonable control yesterday. No changes -diabetic education 10. Hypertension. Monitor with increased mobility. No current anti-hypertensive medication 11. Charcot right foot. Status post ankle fusion 2008 12. GI:  vomiting,nausea -check vomitus, stool for OB -increase protonix to 20mg  bid -prn zofran -labs today with HD, -adjustment of renal/meds per nephrology discretion    Length of stay, days: 5  Walker Kehr , MD 04/22/2015, 9:24 AM

## 2015-04-22 NOTE — Progress Notes (Signed)
Physical Therapy Session Note  Patient Details  Name: [name] MRN: [id number] Date of Birth: 06-13-1966  Today's Date: 04/22/2015 PT Individual Time: 0930-1028 PT Individual Time Calculation (min): 58 min   Short Term Goals: Week 1:  PT Short Term Goal 1 (Week 1): STGs=LTGs due to short ELOS  Skilled Therapeutic Interventions/Progress Updates:    Pt received in recliner and agreeable to therapy session.  Pt reporting that [employer] has not been able to fix his double upright AFO yet and requesting to use primarily w/c this session.  Pt also reports having just received a "cocktail" of medicines that was making his stomach feel woozy, but agreeable to push through during therapy.  Session focused on education and performance of multiple squat/pivot transfers and strengthening. Pt performed squat pivot to/from car, to/from apartment bed, and to/from therapy mat with supervision and verbal cues for set up.  PT instructed patient in BLE for strengthening and activity tolerance.  Pt performed x20 reps of seated marching, LAQ, hip abd with level II theraband, ball squeezes, and supine SLR, heel slides, SAQ, isometric hip extension with 6" bolster, and knee extension stretch x5 minutes with 6" bolster.  Pt propelled w/c >500' with multiple rest breaks, focusing on community level w/c mobility, propelling on uneven surfaces, carpet, 3% grade, negotiating obstacles, and propelling through doorways.  Hand off to family in lobby for grounds pass, RN aware.    Therapy Documentation Precautions:  Precautions Precautions: Fall Required Braces or Orthoses: Other Brace/Splint Other Brace/Splint: AFO RLE Restrictions Weight Bearing Restrictions: Yes LLE Weight Bearing: Non weight bearing Pain: Pain Assessment Pain Assessment: 0-10 Pain Score: 6  Pain Location: Leg Pain Orientation: Left Pain Intervention(s): Emotional support   See Function Navigator for Current Functional  Status.   Therapy/Group: Individual Therapy  Earnest Conroy Penven-Crew 04/22/2015, 9:47 AM

## 2015-04-23 ENCOUNTER — Inpatient Hospital Stay (HOSPITAL_COMMUNITY): Payer: Medicare Other | Admitting: Occupational Therapy

## 2015-04-23 DIAGNOSIS — R112 Nausea with vomiting, unspecified: Secondary | ICD-10-CM

## 2015-04-23 LAB — GLUCOSE, CAPILLARY
GLUCOSE-CAPILLARY: 125 mg/dL — AB (ref 65–99)
GLUCOSE-CAPILLARY: 71 mg/dL (ref 65–99)
GLUCOSE-CAPILLARY: 70 mg/dL (ref 65–99)
Glucose-Capillary: 83 mg/dL (ref 65–99)
Glucose-Capillary: 62 mg/dL — ABNORMAL LOW (ref 65–99)
Glucose-Capillary: 71 mg/dL (ref 65–99)

## 2015-04-23 LAB — POCT GASTRIC OCCULT BLOOD (1-CARD TO LAB): OCCULT BLOOD, GASTRIC: POSITIVE — AB

## 2015-04-23 LAB — GASTRIC OCCULT BLOOD (1-CARD TO LAB)

## 2015-04-23 MED ORDER — CALCIUM CARBONATE ANTACID 500 MG PO CHEW
4.0000 | CHEWABLE_TABLET | Freq: Three times a day (TID) | ORAL | Status: DC
  Administered 2015-04-23 – 2015-04-25 (×6): 800 mg via ORAL
  Filled 2015-04-23 (×7): qty 4

## 2015-04-23 MED ORDER — BISACODYL 10 MG RE SUPP
10.0000 mg | Freq: Once | RECTAL | Status: AC
  Administered 2015-04-23: 10 mg via RECTAL
  Filled 2015-04-23: qty 1

## 2015-04-23 NOTE — Progress Notes (Signed)
Occupational Therapy Session Note  Patient Details  Name: LESLIE DESCHENES MRN: [id number] Date of Birth: 09-Apr-1966  Today's Date: 04/23/2015 OT Individual Time:  - [id number]  (50min)      Short Term Goals: Week 1:  OT Short Term Goal 1 (Week 1): STGs=LTGs secondary to estimated short LOS :     Skilled Therapeutic Interventions/Progress Updates:    Pt. Lying in bed upon OT arrival.  He reported he had a  Bad night.  RN found some blood in spetum.  Pt has been nauseated.  Sat EOB and OT provided Hot cup of decaff tea.  Pt unable to participate any further with OT.    Mised 30 mins of OT due to medcal issues.     Therapy Documentation Precautions:  Precautions Precautions: Fall Required Braces or Orthoses: Other Brace/Splint Other Brace/Splint: AFO RLE Restrictions Weight Bearing Restrictions: Yes LLE Weight Bearing: Non weight bearing General:   Vital Signs: Therapy Vitals Temp: 98.2 F (36.8 C) Temp Source: Oral Pulse Rate: 80 Resp: 18 BP: (!) 119/56 mmHg Patient Position (if appropriate): Lying Oxygen Therapy SpO2: 100 % O2 Device: Not Delivered Pain: Pain Assessment Pain Assessment: No/denies pain        See Function Navigator for Current Functional Status.   Therapy/Group: Individual Therapy  Lisa Roca 04/23/2015, 7:54 AM

## 2015-04-23 NOTE — Progress Notes (Signed)
Subjective:  Still having heartburn, nausea and vomiting. Also constipated, had small "very hard" stool yesterday, wants something else for consitpation. Got sorbitol last night has not worked.    Objective Filed Vitals:   04/21/15 2045 04/22/15 0555 04/22/15 1351 04/23/15 0637  BP: 131/49 115/57 118/62 119/56  Pulse: 101 93 89 80  Temp: 98 F (36.7 C) 98 F (36.7 C) 98.6 F (37 C) 98.2 F (36.8 C)  TempSrc: Oral Oral Oral Oral  Resp: 16 18 18 18   Weight:  111.041 kg (244 lb 12.8 oz)  112.5 kg (248 lb 0.3 oz)  SpO2: 100% 100% 94% 100%   Physical Exam General: alert and oriented- no acute distress.  In wheelchair, family visiting Heart: RRR Lungs: CTA, unlabored  Abdomen: soft, nontender +BS Extremities: L BKA dressing intact. No edema  Dialysis Access:  L HERO +  Dialysis Orders: [facility] 4.5 hr - -rarely runs full time EDW 116- leaving a little below 2 K 2.25 Ca 450/800 HERO heparin 5000 with 2500 mid Mircera 150 last 9/21 no Fe calcitriol 2.5 Recent labs; Hgb 8.6 25% sat ferritin 1300 (July) iPTH 216 Ca upper 9s P 6-8  Assessment: 1. nonhealing foot wound- L BKA Dr. Sharol Given - now in rehab 2. N/V/ heartburn - on PPI/ H2blockers per primary, is constipated as well which may be primary issue.  Ordered dulcolax then TWE if not responding.  May be having intolerance to hospital phoslo, which is different than OP phoslo.  His wife is bringing in home med for this reason.  3. ESRD - MWF - next HD next on monday 4. Hypertension/volume - BP's lowish, no meds. 4 kg under prior dry wt which is appropriate drop after leg amp 5. Anemia - Hgb up to 8.7, dropped after surgery. Guiac +,  inc'd ESA, cont Fe bolus. No gross GIB.  Primary team following.  6. Metabolic bone disease - Chronically elevated phosphorus iPTH 216- takes 4 phoslo and 4 tums 7. Nutrition - renal diet and multivit. Alb 2.9- appetite improving 8. Thrombocytopenia - follow platelets. 135 yesterday 9. DM - per  primary  Plan - dulcolax supp, then tap water enema if needed. Take phoslo from home, ^tums to 4 ac tid.  Home Thursday acc to patient.    Kelly Splinter MD [facility] pager [phone]    cell [phone] 04/23/2015, 10:37 AM    Additional Objective Labs: Basic Metabolic Panel:  Recent Labs Lab 04/17/15 0849 04/19/15 1330 04/21/15 1533  NA 137 135 140  K 4.2 4.3 3.7  CL 92* 89* 92*  CO2 30 30 34*  GLUCOSE 145* 149* 181*  BUN 55* 48* 44*  CREATININE 11.93* 11.03* 10.97*  CALCIUM 9.4 9.5 9.9  PHOS 9.0* 9.0* 8.1*   Liver Function Tests:  Recent Labs Lab 04/17/15 0849 04/19/15 1330 04/21/15 1533  ALBUMIN 2.7* 2.8* 2.9*   No results for input(s): LIPASE, AMYLASE in the last 168 hours. CBC:  Recent Labs Lab 04/17/15 0849 04/19/15 1416 04/20/15 1209 04/21/15 1533 04/22/15 1430  WBC 6.4 7.7 7.5 5.7 6.4  HGB 7.5* 7.9* 8.5* 7.8* 8.7*  HCT 25.1* 26.4* 29.2* 26.6* 29.8*  MCV 89.0 88.3 90.4 89.3 90.9  PLT 99* 146* 174 165 161   Blood Culture    Component Value Date/Time   SDES WOUND EXIT SITE ARM RIGHT 11/06/2012 1820   SDES GRAFT ARM RIGHT 11/06/2012 1820   SDES WOUND EXIT SITE ARM RIGHT 11/06/2012 1820   SPECREQUEST  11/06/2012 1820  PATIENT ON FOLLOWING VANCOMYCIN INFECTED AV GRAFT SWAB   SPECREQUEST PATIENT ON FOLLOWING VANCOMYCIN AV GRAFT 11/06/2012 1820   SPECREQUEST  11/06/2012 1820    PATIENT ON FOLLOWING VANCOMYCIN INFECTED AV GRAFT SWAB   CULT  11/06/2012 1820    MODERATE METHICILLIN RESISTANT STAPHYLOCOCCUS AUREUS Note: RIFAMPIN AND GENTAMICIN SHOULD NOT BE USED AS SINGLE DRUGS FOR TREATMENT OF STAPH INFECTIONS. This organism DOES NOT demonstrate inducible Clindamycin resistance in vitro. CRITICAL RESULT CALLED TO, READ BACK BY AND VERIFIED WITH: JAMIE Y 4/28 @900   BY REAMM   CULT  11/06/2012 1820    ABUNDANT STAPHYLOCOCCUS AUREUS Note: SUSCEPTIBILITIES PERFORMED ON PREVIOUS CULTURE WITHIN THE LAST 5 DAYS.   CULT NO ANAEROBES  ISOLATED 11/06/2012 1820   REPTSTATUS 11/09/2012 FINAL 11/06/2012 1820   REPTSTATUS 11/10/2012 FINAL 11/06/2012 1820   REPTSTATUS 11/11/2012 FINAL 11/06/2012 1820    Cardiac Enzymes: No results for input(s): CKTOTAL, CKMB, CKMBINDEX, TROPONINI in the last 168 hours. CBG:  Recent Labs Lab 04/22/15 0645 04/22/15 1133 04/22/15 1634 04/22/15 2132 04/23/15 0636  GLUCAP 143* 94 130* 125* 125*   Iron Studies: No results for input(s): IRON, TIBC, TRANSFERRIN, FERRITIN in the last 72 hours. @lablastinr3 @ Studies/Results: No results found. Medications:   . aspirin EC  325 mg Oral Daily  . bisacodyl  10 mg Rectal Once  . calcitRIOL  2.5 mcg Oral Q M,W,F-HD  . calcium carbonate  4 tablet Oral TID WC  . [START ON 04/24/2015] darbepoetin (ARANESP) injection - DIALYSIS  200 mcg Intravenous Q Mon-HD  . docusate sodium  100 mg Oral BID  . famotidine  20 mg Oral QHS  . ferric gluconate (FERRLECIT/NULECIT) IV  125 mg Intravenous Q M,W,F-HD  . insulin aspart  0-9 Units Subcutaneous TID WC  . insulin aspart  3 Units Subcutaneous TID WC  . insulin detemir  20 Units Subcutaneous QHS  . pantoprazole  40 mg Oral BID

## 2015-04-23 NOTE — Progress Notes (Signed)
Patient with complaints of nausea, vomiting 500 ml emesis, with abdominal discomfort. Dr. Burnice Logan paged orders given for 20 mg Pepcid BID. Order changed to one time dose. Patient able to sleep with no distress. Will continue to monitor patient. Gastro hemocult  Done. adm

## 2015-04-23 NOTE — Progress Notes (Signed)
Pt refusing all meds this morning. Refusing to eat. Will continue to monitor.

## 2015-04-23 NOTE — Significant Event (Signed)
Hypoglycemic Event  CBG: 62  Treatment: 15 gram snack  Symptoms: None  Follow-up CBG: Time:2133 CBG Result:71  Possible Reasons for Event: Unknown  Comments/MD notified:Dr. Plotnikov    [name] D  Adult (Non-Pregnant) Hypoglycemia Protocol Treatment Guidelines  1.  RN shall initiate Hypoglycemia Protocol emergency measures immediately when:            w Routine or STAT CBG and/or a lab glucose indicates hypoglycemia (CBG < 70 mg/dl)  2.  Treat the patient according to ability to take PO's and severity of hypoglycemia.   3.  If patient is on GlucoStabilizer, follow directions provided by the [facility] for hypoglycemic events.  4.  If patient on insulin pump, follow Hypoglycemia Protocol.  If patient requires more than one treatment have patient place pump in SUSPEND and notify MD.  DO NOT leave pump in SUSPEND for greater than 30 minutes unless ordered by MD.  A.  Treatment for Mild or Moderate-Patient cooperative and able to swallow    1.  Patient taking PO's and can cooperate   a.  Give one of the following 15 gram CHO options:                           w 1 tube oral dextrose gel                           w 3-4 Glucose tablets                           w 4 oz. Juice                           w 4 oz. regular soda                                    ESRD patients:  clear, regular soda                           w 8 oz. skim milk    b.  Recheck CBG in 15 minutes after treatment                            w If CBG < 70 mg/dl, repeat treatment and recheck until hypoglycemia is resolved                            w If CBG > 70 mg/dl and next meal is more than 1 hour away, give additional 15 grams CHO   2.  Patient NPO-Patient cooperative and no altered mental status    a.  Give 25 ml of D50 IV.   b.  Recheck CBG in 15 minutes after treatment.                             w If CBG is less than 70 mg/dl, repeat treatment and recheck until hypoglycemia is  resolved.   c.  Notify MD for further orders.             SPECIAL CONSIDERATIONS:    a.  If no IV  access,                              w Start IV of D5W at [facility]                             w Give 25 ml of D50 IV.    b.  If unable to gain IV access                             w Give Glucagon IM:    i.  1 mg if patient weighs more than 45.5 kg    ii.  0.5 mg if patient weighs less than 45.5 kg   c.  Notify MD for further orders  B.  Treatment for Severe-- Patient unconscious or unable to take PO's safely    1.  Position patient on side   2.  Give 50 ml D50 IV   3.  Recheck CBG in 15 minutes.                    w If CBG is less than 70 mg/dl, repeat treatment and recheck until hypoglycemia is resolved.   4.  Notify MD for further orders.    SPECIAL CONSIDERATIONS:    a.  If no IV access                              w Give Glucagon IM                                        i.  1 mg if patient weighs more than 45.5 kg                                       ii.  0.5 mg if patient weighs less than 45.5 kg                              w Start IV of D5W at 50 ml/hr and give 50 ml D50 IV   b.  If no IV access and active seizure                               w Call Rapid Response   c.  If unable to gain IV access, give Glucagon IM:                              w 1 mg if patient weighs more than 45.5 kg                              w 0.5 mg if patient weighs less than 45.5 kg   d.  Notify MD for further orders.  C.  Complete smart text progress note to document intervention and follow-up CBG   1.  In [facility] patient chart, click on Notes (left side of screen)  2.  Create Progress Note   3.  Click on [employer].  In the Match box type "hypo" and enter    4.  Double click on [facility] IP HYPOGLYCEMIC EVENT and enter data   5.  MD must be notified if patient is NPO or experienced severe hypoglycemia

## 2015-04-23 NOTE — Progress Notes (Addendum)
[name] is a 49 y.o. male July 01, 1966 [id number]  Subjective: C/o nausea x 4-5 days with occ vomiting. No abd pain. No constipation. Pt thinks it could be related to meds. Feeling OK.  Objective: Vital signs in last 24 hours: Temp:  [98.2 F (36.8 C)-98.6 F (37 C)] 98.2 F (36.8 C) (10/09 [id number]) Pulse Rate:  [80-89] 80 (10/09 0637) Resp:  [18] 18 (10/09 0637) BP: (118-119)/(56-62) 119/56 mmHg (10/09 0637) SpO2:  [94 %-100 %] 100 % (10/09 0637) Weight:  [248 lb 0.3 oz (112.5 kg)] 248 lb 0.3 oz (112.5 kg) (10/09 [id number]) Weight change: 22 lb 4.3 oz (10.1 kg) Last BM Date: 04/22/15 (Pt does not feel emptied)  Intake/Output from previous day: 10/08 0701 - 10/09 0700 In: 720 [P.O.:720] Out: 500  Last cbgs: CBG (last 3)   Recent Labs  04/22/15 1634 04/22/15 2132 04/23/15 0636  GLUCAP 130* 125* 125*     Physical Exam General: No apparent distress   HEENT: not dry Lungs: Normal effort. Lungs clear to auscultation, no crackles or wheezes. Cardiovascular: Regular rate and rhythm, no edema Abdomen: S/NT/ND; BS(+), round Musculoskeletal:  unchanged Neurological: No new neurological deficits Wounds: dressed L amputee   Skin: clear  Aging changes Mental state: Alert, oriented, cooperative    Lab Results: BMET    Component Value Date/Time   NA 140 04/21/2015 1533   NA 135* 02/10/2014 1319   K 3.7 04/21/2015 1533   K 4.6 02/17/2014 1353   CL 92* 04/21/2015 1533   CL 99 02/10/2014 1319   CO2 34* 04/21/2015 1533   CO2 25 02/10/2014 1319   GLUCOSE 181* 04/21/2015 1533   GLUCOSE 205* 02/10/2014 1319   BUN 44* 04/21/2015 1533   BUN 40* 02/10/2014 1319   CREATININE 10.97* 04/21/2015 1533   CREATININE 11.58* 02/10/2014 1319   CALCIUM 9.9 04/21/2015 1533   CALCIUM 8.4* 02/10/2014 1319   GFRNONAA 5* 04/21/2015 1533   GFRNONAA 5* 02/10/2014 1319   GFRAA 5* 04/21/2015 1533   GFRAA 5* 02/10/2014 1319   CBC    Component Value Date/Time   WBC 6.4 04/22/2015 1430    WBC 8.1 02/10/2014 1319   RBC 3.28* 04/22/2015 1430   RBC 3.86* 02/10/2014 1319   HGB 8.7* 04/22/2015 1430   HGB 11.4* 02/10/2014 1319   HCT 29.8* 04/22/2015 1430   HCT 36.3* 02/10/2014 1319   PLT 161 04/22/2015 1430   PLT 140* 02/10/2014 1319   MCV 90.9 04/22/2015 1430   MCV 94 02/10/2014 1319   MCH 26.5 04/22/2015 1430   MCH 29.4 02/10/2014 1319   MCHC 29.2* 04/22/2015 1430   MCHC 31.3* 02/10/2014 1319   RDW 17.2* 04/22/2015 1430   RDW 16.7* 02/10/2014 1319   LYMPHSABS 1.8 02/10/2014 1319   LYMPHSABS 1.5 11/06/2012 1407   MONOABS 0.8 02/10/2014 1319   MONOABS 0.8 11/06/2012 1407   EOSABS 0.6 02/10/2014 1319   EOSABS 0.2 11/06/2012 1407   BASOSABS 0.1 02/10/2014 1319   BASOSABS 0.0 11/06/2012 1407    Studies/Results: No results found.  Medications: I have reviewed the patient's current medications.  Assessment/Plan:  1. Functional deficits secondary to left BKA secondary nonhealing ulcer 04/14/2015 -struggling to participate in therapies due to symptomatic anemia 2. DVT Prophylaxis/Anticoagulation: SCDs right lower extremity. OOB 3. Pain Management: Oxycodone and Robaxin as needed. Monitor with increased mobility 4. End-stage renal disease with hemodialysis. Follow-up per renal services. HD in PM after therapy day 5. Neuropsych: This patient is capable of making decisions on  his own behalf. 6. Skin/Wound Care: Routine skin checks 7. Fluids/Electrolytes/Nutrition: follow-up chemistries with HD 8. Acute on chronic anemia. Continue Aranesp.  -Fe bolus,  -txfsn prn  9. Diabetes mellitus with peripheral neuropathy. Levemir 20 units daily, NovoLog 3 units 3 times a day, d/c Prandin. Sliding scale insulin.  -reasonable control yesterday. No changes -diabetic education 10. Hypertension. Monitor with increased mobility. No current anti-hypertensive medication 11. Charcot right foot. Status post ankle fusion  2008 12. GI: vomiting,nausea - pt thinks it could be meds - vomitus and stool - guaiac (+) - ?M-W tears -increase protonix to 40mg  bid -prn zofran -labs with HD - lipase, CMET, CBC tomorrow -adjustment of renal/meds per nephrology discretion             -may need a GI consult    Length of stay, days: 6  Walker Kehr , MD 04/23/2015, 9:03 AM

## 2015-04-23 NOTE — Plan of Care (Signed)
Problem: RH BOWEL ELIMINATION Goal: RH STG MANAGE BOWEL WITH ASSISTANCE STG Manage Bowel with Min Assistance.  Outcome: Not Progressing Requiring enema for bowel evacuation Goal: RH STG MANAGE BOWEL W/MEDICATION W/ASSISTANCE STG Manage Bowel with Medication with Cobb.  Outcome: Not Progressing Needing enema to evacuate bowels

## 2015-04-23 NOTE — Progress Notes (Deleted)
[place] nurse reported patient in need of Orthopedic surgeon consult with Dr. Sharol Given. Dr. Alain Marion aware and will follow up on Monday. adm

## 2015-04-24 ENCOUNTER — Inpatient Hospital Stay (HOSPITAL_COMMUNITY): Payer: Medicare Other | Admitting: Occupational Therapy

## 2015-04-24 ENCOUNTER — Inpatient Hospital Stay (HOSPITAL_COMMUNITY): Payer: Medicare Other | Admitting: Physical Therapy

## 2015-04-24 DIAGNOSIS — Z89512 Acquired absence of left leg below knee: Secondary | ICD-10-CM

## 2015-04-24 DIAGNOSIS — E1165 Type 2 diabetes mellitus with hyperglycemia: Secondary | ICD-10-CM

## 2015-04-24 DIAGNOSIS — N186 End stage renal disease: Secondary | ICD-10-CM

## 2015-04-24 DIAGNOSIS — E1140 Type 2 diabetes mellitus with diabetic neuropathy, unspecified: Secondary | ICD-10-CM

## 2015-04-24 LAB — COMPREHENSIVE METABOLIC PANEL
ALBUMIN: 2.9 g/dL — AB (ref 3.5–5.0)
ALT: <5 U/L — ABNORMAL LOW (ref 17–63)
AST: 18 U/L (ref 15–41)
Alkaline Phosphatase: 50 U/L (ref 38–126)
Anion gap: 16 — ABNORMAL HIGH (ref 5–15)
BUN: 48 mg/dL — AB (ref 6–20)
CHLORIDE: 95 mmol/L — AB (ref 101–111)
CO2: 26 mmol/L (ref 22–32)
Calcium: 9.8 mg/dL (ref 8.9–10.3)
Creatinine, Ser: 11.98 mg/dL — ABNORMAL HIGH (ref 0.61–1.24)
GFR calc Af Amer: 5 mL/min — ABNORMAL LOW (ref >60)
GFR calc non Af Amer: 4 mL/min — ABNORMAL LOW (ref >60)
GLUCOSE: 159 mg/dL — AB (ref 65–99)
POTASSIUM: 4.5 mmol/L (ref 3.5–5.1)
Sodium: 137 mmol/L (ref 135–145)
Total Bilirubin: 0.3 mg/dL (ref 0.3–1.2)
Total Protein: 6.8 g/dL (ref 6.5–8.1)

## 2015-04-24 LAB — CBC
HCT: 28.9 % — ABNORMAL LOW (ref 39.0–52.0)
Hemoglobin: 8.8 g/dL — ABNORMAL LOW (ref 13.0–17.0)
MCH: 27.3 pg (ref 26.0–34.0)
MCHC: 30.4 g/dL (ref 30.0–36.0)
MCV: 89.8 fL (ref 78.0–100.0)
PLATELETS: 158 10*3/uL (ref 150–400)
RBC: 3.22 MIL/uL — AB (ref 4.22–5.81)
RDW: 17.2 % — AB (ref 11.5–15.5)
WBC: 6 10*3/uL (ref 4.0–10.5)

## 2015-04-24 LAB — GLUCOSE, CAPILLARY
GLUCOSE-CAPILLARY: 157 mg/dL — AB (ref 65–99)
Glucose-Capillary: 99 mg/dL (ref 65–99)
Glucose-Capillary: 116 mg/dL — ABNORMAL HIGH (ref 65–99)
Glucose-Capillary: 121 mg/dL — ABNORMAL HIGH (ref 65–99)

## 2015-04-24 LAB — LIPASE, BLOOD: LIPASE: 150 U/L — AB (ref 22–51)

## 2015-04-24 MED ORDER — DARBEPOETIN ALFA 200 MCG/0.4ML IJ SOSY
PREFILLED_SYRINGE | INTRAMUSCULAR | Status: AC
  Filled 2015-04-24: qty 0.4

## 2015-04-24 NOTE — Progress Notes (Addendum)
[facility]ICINE & REHABILITATION     PROGRESS NOTE    Subjective/Complaints: Pt states he had a good weekend.  He had some nausea over the weekend, but it has resolved.    ROS: Pt denies fever, rash/itching, headache, blurred or double vision, nausea, vomiting, abdominal pain, diarrhea, chest pain, shortness of breath, palpitations, dysuria, dizziness, neck or back pain, bleeding, anxiety, or depression  Objective: Vital Signs: Blood pressure 102/45, pulse 87, temperature 97.8 F (36.6 C), temperature source Oral, resp. rate 18, weight 112.5 kg (248 lb 0.3 oz), SpO2 99 %. No results found.  Recent Labs  04/21/15 1533 04/22/15 1430  WBC 5.7 6.4  HGB 7.8* 8.7*  HCT 26.6* 29.8*  PLT 165 161    Recent Labs  04/21/15 1533  NA 140  K 3.7  CL 92*  GLUCOSE 181*  BUN 44*  CREATININE 10.97*  CALCIUM 9.9   CBG (last 3)   Recent Labs  04/23/15 2133 04/23/15 2137 04/24/15 0635  GLUCAP 71 70 157*    Wt Readings from Last 3 Encounters:  04/23/15 112.5 kg (248 lb 0.3 oz)  04/17/15 111 kg (244 lb 11.4 oz)  01/06/14 117.935 kg (260 lb)    Physical Exam:  Constitutional: no distress  HENT:  Head: Normocephalic and atraumatic.  Eyes: Conjunctivae and EOM are normal.  Neck: Normal range of motion. Neck supple. No thyromegaly present.  Cardiovascular: Normal rate and regular rhythm---no tachy this morning. no murmurs. Respiratory: Effort normal and breath sounds normal.  GI: Soft. Bowel sounds are normal. He exhibits no distension. Mild epigastric pain with palpation Musculoskeletal: Normal range of motion.  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 No blood seen through dressing RUE, LUE, LLE 5/5 strength RLE charcot foot,  Neurological: He is alert and oriented to person, place, and time.  Diminished sensation right foot, charcot deformity present.  Skin: Skin is warm and dry.  Left BKA site is dressed with coban. Left foot appears intact, dry  skin Psychiatric: He has a normal mood and affect, affect more dynamic  Assessment/Plan: 1. Functional deficits secondary to left BKA which require 3+ hours per day of interdisciplinary therapy in a comprehensive inpatient rehab setting. Physiatrist is providing close team supervision and 24 hour management of active medical problems listed below. Physiatrist and rehab team continue to assess barriers to discharge/monitor patient progress toward functional and medical goals.  Function:  Bathing Bathing position   Position: Wheelchair/chair at sink  Bathing parts Body parts bathed by patient: Right arm, Left arm, Chest, Abdomen, Right upper leg, Left upper leg, Right lower leg Body parts bathed by helper: Back  Bathing assist Assist Level: Set up      Upper Body Dressing/Undressing Upper body dressing   What is the patient wearing?: Pull over shirt/dress     Pull over shirt/dress - Perfomed by patient: Thread/unthread right sleeve, Pull shirt over trunk, Thread/unthread left sleeve, Put head through opening          Upper body assist Assist Level: No help, No cues      Lower Body Dressing/Undressing Lower body dressing Lower body dressing/undressing activity did not occur: Refused What is the patient wearing?: Underwear, Pants Underwear - Performed by patient: Thread/unthread right underwear leg, Thread/unthread left underwear leg, Pull underwear up/down   Pants- Performed by patient: Thread/unthread right pants leg, Thread/unthread left pants leg, Pull pants up/down, Fasten/unfasten pants       Socks - Performed by patient: Don/doff right sock   Shoes -  Performed by patient: Don/doff right shoe, Fasten right   AFO - Performed by patient: Don/doff right AFO        Lower body assist Assist for lower body dressing: Touching or steadying assistance (Pt > 75%)      Toileting Toileting Toileting activity did not occur: No continent bowel/bladder event Toileting steps  completed by patient: Adjust clothing prior to toileting, Adjust clothing after toileting Toileting steps completed by helper: Performs perineal hygiene Toileting Assistive Devices: Grab bar or rail  Toileting assist Assist level: Supervision or verbal cues   Transfers Chair/bed transfer   Chair/bed transfer method: Squat pivot Chair/bed transfer assist level: Supervision or verbal cues Chair/bed transfer assistive device: Armrests     Locomotion Ambulation     Max distance: 30 Assist level: Touching or steadying assistance (Pt > 75%)   Wheelchair   Type: Manual Max wheelchair distance: 150 Assist Level: No help, No cues, assistive device, takes more than reasonable amount of time  Cognition Comprehension Comprehension assist level: Follows complex conversation/direction with no assist  Expression Expression assist level: Expresses complex ideas: With no assist  Social Interaction Social Interaction assist level: Interacts appropriately with others with medication or extra time (anti-anxiety, antidepressant).  Problem Solving Problem solving assist level: Solves complex problems: With extra time  Memory Memory assist level: Complete Independence: No helper   Medical Problem List and Plan: 1. Functional deficits secondary to left BKA secondary nonhealing ulcer 04/14/2015  -Cont CIR 2. DVT Prophylaxis/Anticoagulation: SCDs right lower extremity. OOB 3. Pain Management: Oxycodone and Robaxin as needed. Monitor with increased mobility 4. End-stage renal disease with hemodialysis. Follow-up per renal services. HD in PM after therapy day 5. Neuropsych: This patient is capable of making decisions on his own behalf. 6. Skin/Wound Care: Routine skin checks 7. Fluids/Electrolytes/Nutrition:   follow-up chemistries with HD 8. Acute on chronic anemia. Continue Aranesp.   -Fe bolus,   -txfsn prn  9. Diabetes mellitus with peripheral neuropathy. Levemir 20 units daily, NovoLog 3 units 3  times a day, Prandin 0.5 mg twice a day. Sliding scale insulin.   -Fasting 157 today, will cont to monitor  -diabetic education 10. Hypertension. Monitor with increased mobility. No current anti-hypertensive medication 11. Charcot right foot. Status post ankle fusion 2008 12. GI: vomiting,nausea  -+occult gastric blood, however, no clinical signs of bleeding at present and Hb has been relatively stable, will order serial H/Hs. Will consider GI consult if necessary.  -increased protonix to 20mg  bid  -prn zofran  -adjustment of renal/meds per nephrology discretion  LOS (Days) 7 A FACE TO FACE EVALUATION WAS PERFORMED  [name] Lorie Phenix 04/24/2015 8:40 AM

## 2015-04-24 NOTE — Progress Notes (Addendum)
Occupational Therapy Note  Patient Details  Name: [name] MRN: [id number] Date of Birth: 03/28/1966  Today's Date: 04/24/2015 OT Missed Time: 90 Minutes  Missed Time Reason: Patient unwilling/refused to participate without medical reason OT timed minutes : 1430-1513   43 minutes skilled.  Session 1: Pt supine in bed with no c/o pain this session. Pt reporting, " I am not getting out of bed for therapy. I was sick yesterday." OT provided education regarding lack of progress towards goals secondary to refusal and its effect on discharge. Pt states, " I don't care. Just send me home." Pt remained supine in bed with call bell and all needed items within room upon exiting the room.    Session 2: Upon entering the room, pt seated in recliner chair with no c/o pain this session. Pt requiring maximal encouragement for participation in therapy this session. Pt attempting to use cell phone many times during session and requiring cues to attend to task. Pt propelled wheelchair 150' up slight incline with increased time and Mod I this session. Pt required rest break secondary to fatigue. Pt propelling 130' back towards ADL apartment before requiring 2nd rest break. OT propelled pt the rest of the way for time management. Pt performing wheelchair set up for squat pivot wheelchair <>standard height bed with mod I.Then squat pivot wheelchair<> soft, plush sofa with Mod I. Pt returning to room and transferring into recliner chair from wheelchair with mod I. Call bell and all needed items within reach upon exiting the room.   [name] 04/24/2015, 10:48 AM

## 2015-04-24 NOTE — Progress Notes (Signed)
RN notified that pt is refusing therapies today. Pt is complaining of "not feeling well" this morning. PA notifed of pt refusal. Will continue to monitor pt's condition. Pt refusing any medications to help with his stomach pains this morning.

## 2015-04-24 NOTE — Progress Notes (Signed)
Occupational Therapy Note  Patient Details  Name: [name] MRN: [id number] Date of Birth: May 13, 1966   Pt's plan of care adjusted to 15/7 after speaking with care team and discussed with PA as pt currently unable to tolerate current therapy schedule with OT, PT, and SLP.    [name] 04/24/2015, 10:41 AM

## 2015-04-24 NOTE — Procedures (Signed)
I was present at this dialysis session. I have reviewed the session itself and made appropriate changes.   Doing Wel.. UF Goal 3L normotensive.  Plan for DC Wed he says, will plan on HD in AM that day to facilitate DC.    Pearson Grippe  MD 04/24/2015, 3:52 PM

## 2015-04-24 NOTE — Progress Notes (Signed)
Physical Therapy Session Note  Patient Details  Name: [name] MRN: [id number] Date of Birth: [birth year]  Today's Date: 04/24/2015 PT Individual Time: 1300-1400 PT Individual Time Calculation (min): 60 min   Short Term Goals: Week 1:  PT Short Term Goal 1 (Week 1): STGs=LTGs due to short ELOS  Skilled Therapeutic Interventions/Progress Updates:    Pt received in recliner and agreeable to therapy session. Session focused on w/c mobility, endurance, and whole body strengthening. Pt performs squat/pivot transfers to/from w/c with set up only.  Pt performs 10 mins on Nustep with 1 rest break at level 4 for cardiopulmonary endurance and LE/UE strengthening.  Pt performed ambulatory transfer from Nustep to therapy mat with RW and PT instructed patient in BLE therex x20 reps for LAQ, supine knee flexion/heel slides, SLR, and hip abd/add to midline. Pt performed 5 min L knee extension stretch with 6" bolster and requested 2 extra minutes at completion.  Pt transferred back to w/c at end of session, returned to room, and positioned in recliner with call bell in reach and needs met.   Therapy Documentation Precautions:  Precautions Precautions: Fall Required Braces or Orthoses: Other Brace/Splint Other Brace/Splint: AFO RLE Restrictions Weight Bearing Restrictions: Yes LLE Weight Bearing: Non weight bearing  See Function Navigator for Current Functional Status.   Therapy/Group: Individual Therapy  [name] 04/24/2015, 1:13 PM

## 2015-04-25 ENCOUNTER — Inpatient Hospital Stay (HOSPITAL_COMMUNITY): Payer: Medicare Other | Admitting: Occupational Therapy

## 2015-04-25 ENCOUNTER — Inpatient Hospital Stay (HOSPITAL_COMMUNITY): Payer: Medicare Other | Admitting: Physical Therapy

## 2015-04-25 ENCOUNTER — Inpatient Hospital Stay (HOSPITAL_COMMUNITY): Payer: Medicare Other

## 2015-04-25 DIAGNOSIS — E1140 Type 2 diabetes mellitus with diabetic neuropathy, unspecified: Secondary | ICD-10-CM

## 2015-04-25 LAB — GLUCOSE, CAPILLARY
GLUCOSE-CAPILLARY: 106 mg/dL — AB (ref 65–99)
GLUCOSE-CAPILLARY: 146 mg/dL — AB (ref 65–99)
Glucose-Capillary: 213 mg/dL — ABNORMAL HIGH (ref 65–99)
Glucose-Capillary: 119 mg/dL — ABNORMAL HIGH (ref 65–99)

## 2015-04-25 LAB — CBC WITH DIFFERENTIAL/PLATELET
BASOS ABS: 0 10*3/uL (ref 0.0–0.1)
BASOS PCT: 0 %
Eosinophils Absolute: 0.3 10*3/uL (ref 0.0–0.7)
Eosinophils Relative: 5 %
HEMATOCRIT: 30.8 % — AB (ref 39.0–52.0)
Hemoglobin: 9.2 g/dL — ABNORMAL LOW (ref 13.0–17.0)
LYMPHS PCT: 18 %
Lymphs Abs: 1.1 10*3/uL (ref 0.7–4.0)
MCH: 27.3 pg (ref 26.0–34.0)
MCHC: 29.9 g/dL — ABNORMAL LOW (ref 30.0–36.0)
MCV: 91.4 fL (ref 78.0–100.0)
MONO ABS: 0.7 10*3/uL (ref 0.1–1.0)
Monocytes Relative: 11 %
NEUTROS ABS: 4 10*3/uL (ref 1.7–7.7)
NEUTROS PCT: 66 %
Platelets: 164 10*3/uL (ref 150–400)
RBC: 3.37 MIL/uL — ABNORMAL LOW (ref 4.22–5.81)
RDW: 17.3 % — ABNORMAL HIGH (ref 11.5–15.5)
WBC: 6.1 10*3/uL (ref 4.0–10.5)

## 2015-04-25 NOTE — Progress Notes (Signed)
Social Work Patient ID: [name], male   DOB: Oct 06, 1965, 49 y.o.   MRN: [id number]   Alerted by therapies this morning that pt reports he is to d/c tomorrow.  Discussed with pt that d/c date set in last week's team conference was Thursday 10/13.  He refuses to stay until 10/13 and insists d/c tomorrow after HD complete.  MD has approved change in dc date and therapies are completing grad day today.  Discussed recommended DME (w/c, walker and tub bench) but pt refuses tub bench noting "I have something set up already".  Discussed follow up [facility] including [place], PT and OT.  Pt agrees to therapies but refuses RN and states that his sister-in-law is a Therapist, sports and she can check incision and skin breakdown sites. Continue to follow through d/c tomorrow.  Jader Desai, LCSW

## 2015-04-25 NOTE — Plan of Care (Signed)
Problem: RH Bathing Goal: LTG Patient will bathe with assist, cues/equipment (OT) LTG: Patient will bathe specified number of body parts with assist with/without cues using equipment (position) (OT)  Outcome: Not Met (add Reason) Pt refusals for self care with therapist. Pt requiring supervision/set up .   Problem: RH Dressing Goal: LTG Patient will perform upper body dressing (OT) LTG Patient will perform upper body dressing with assist, with/without cues (OT).  Outcome: Not Met (add Reason) Pt refusals for self care with therapist. Pt requiring supervision/set up .  Goal: LTG Patient will perform lower body dressing w/assist (OT) LTG: Patient will perform lower body dressing with assist, with/without cues in positioning using equipment (OT)  Outcome: Not Met (add Reason) Pt refusals for self care with therapist. Pt requiring supervision/set up .   Problem: RH Toileting Goal: LTG Patient will perform toileting w/assist, cues/equip (OT) LTG: Patient will perform toiletiing (clothes management/hygiene) with assist, with/without cues using equipment (OT)  Outcome: Not Met (add Reason) Pt refusals for self care with therapist. Pt requiring supervision/set up .   Problem: RH Toilet Transfers Goal: LTG Patient will perform toilet transfers w/assist (OT) LTG: Patient will perform toilet transfers with assist, with/without cues using equipment (OT)  Outcome: Not Met (add Reason) Pt refusals for self care with therapist. Pt requiring supervision/set up .   Problem: RH Tub/Shower Transfers Goal: LTG Patient will perform tub/shower transfers w/assist (OT) LTG: Patient will perform tub/shower transfers with assist, with/without cues using equipment (OT)  Outcome: Not Met (add Reason) Pt refusals for self care with therapist. Pt requiring supervision/set up .

## 2015-04-25 NOTE — Progress Notes (Signed)
Physical Therapy Discharge Summary  Patient Details  Name: [name] MRN: [id number] Date of Birth: 12-11-1965  Today's Date: 04/25/2015 PT Individual Time: 1100-1155 and  1300-1339 PT Individual Time Calculation (min): 55 min and 39 min    Patient has met 6 of 9 long term goals due to improved activity tolerance, improved balance, improved postural control, increased strength, increased range of motion and improved coordination.  Patient to discharge at w/c level Modified Independent.   Patient's care partner has not been in for family training, but patient is able to self direct care and is safe to d/c home.    Reasons goals not met: Pt has not met ambulation or stair goal.  Pt used double upright AFO for ambulation PTA and velcro on AFO would not hold in place and pt is refusing gait training for several days prior and at time of d/c.      Recommendation:  Patient will benefit from ongoing skilled PT services in home health setting to continue to advance safe functional mobility, address ongoing impairments in activity tolerance, strength, endurance, and balance, and minimize fall risk.  Equipment: 18x18 w/c with L BKA support, RW  Reasons for discharge: treatment goals met  Patient/family agrees with progress made and goals achieved: Yes   Skilled Therapeutic Intervention: Session 1: Pt received in recliner and agreeable to therex this AM with some encouragement. Pt reporting increased pain in LLE following dressing change this AM and refusing ambulation and stair negotiation.  Pt transfers to w/c and to therapy mat mod I.  PT performed therex x20 reps of LAQ, hip flexion, heel slides, hip abd/add to midline, SLR, and knee extension stretch.  PT provided pt with HEP and reviewed each exercise with patient.  Pt verbalized understanding and demonstrated through teach back.    Session 2 (missed 21 minutes): Pt received in recliner, RN present, and pt with visible discomfort,  reporting "200/10" pain in L residual limb. Pt restless, and frequently changing position and groaning.  Pt agreeable to reassessment of strength, balance, sensation, and patient education.  PT provided patient education on d/c DME, including w/c breakdown/set up, w/c parts, RW breakdown/set up, answering questions about knee scooter, etc.  PT also provided patient education on HEP, reviewing exercises and frequency with patient verbally.  PT provided pt education on icing LLE for pain relief and to reduce swelling.  Pt positioned in recliner at end of session with ice pack surrounding L residual limb with call bell in reach and needs met.    PT Discharge Precautions/Restrictions Precautions Precautions: Fall Required Braces or Orthoses: Other Brace/Splint Other Brace/Splint: AFO RLE Restrictions Weight Bearing Restrictions: Yes LLE Weight Bearing: Non weight bearing  Pain Pain Assessment Pain Assessment: 0-10 Pain Score: 10-Worst pain ever Pain Type: Surgical pain Pain Location: Leg Pain Orientation: Left Pain Descriptors / Indicators: Throbbing Pain Onset: On-going   Cognition Overall Cognitive Status: Within Functional Limits for tasks assessed Arousal/Alertness: Awake/alert Orientation Level: Oriented X4 Memory: Appears intact Awareness: Appears intact Problem Solving: Appears intact Safety/Judgment: Appears intact Sensation Sensation Light Touch: Appears Intact Hot/Cold: Appears Intact Coordination Gross Motor Movements are Fluid and Coordinated: Yes Fine Motor Movements are Fluid and Coordinated: Yes Motor  Motor Motor: Abnormal postural alignment and control  Mobility Bed Mobility Bed Mobility: Sit to Supine;Supine to Sit Supine to Sit: 6: Modified independent (Device/Increase time) Sit to Supine: 6: Modified independent (Device/Increase time) Transfers Transfers: Yes Sit to Stand: 6: Modified independent (Device/Increase time) Stand to Sit:  6: Modified  independent (Device/Increase time) Stand Pivot Transfers: 6: Modified independent (Device/Increase time) Locomotion  Ambulation Ambulation:  (refused) Gait Gait:  (refused) Product manager Mobility: Yes Wheelchair Assistance: 6: Modified independent (Device/Increase time) Environmental health practitioner: Both upper extremities Wheelchair Parts Management: Independent Distance: 150  Trunk/Postural Assessment  Cervical Assessment Cervical Assessment: Exceptions to [facility] (forward head) Thoracic Assessment Thoracic Assessment: Exceptions to [facility] (rounded shoulders) Lumbar Assessment Lumbar Assessment: Exceptions to [facility] (posterior pelvic tilt) Postural Control Postural Control: Within Functional Limits  Balance Balance Balance Assessed: Yes Static Sitting Balance Static Sitting - Balance Support: Feet unsupported;No upper extremity supported Static Sitting - Level of Assistance: 7: Independent Dynamic Sitting Balance Dynamic Sitting - Balance Support: Feet supported;No upper extremity supported Dynamic Sitting - Level of Assistance: 7: Independent Static Standing Balance Static Standing - Balance Support: Bilateral upper extremity supported Static Standing - Level of Assistance: 6: Modified independent (Device/Increase time) Dynamic Standing Balance Dynamic Standing - Balance Support: During functional activity;Left upper extremity supported;Right upper extremity supported Dynamic Standing - Level of Assistance: 6: Modified independent (Device/Increase time) Dynamic Standing - Balance Activities: Ball toss Extremity Assessment   RLE Assessment RLE Assessment: Within Functional Limits LLE Assessment LLE Assessment: Within Functional Limits   See Function Navigator for Current Functional Status.  Caitlin E Penven-Crew 04/25/2015, 2:04 PM

## 2015-04-25 NOTE — Progress Notes (Signed)
Occupational Therapy Session Note  Patient Details  Name: [name] MRN: [id number] Date of Birth: Jun 25, 1966  Today's Date: 04/25/2015 OT Individual Time: 1000-1045 OT Individual Time Calculation (min): 45 min  and Today's Date: 04/25/2015 OT Missed Time: 15 Minutes Missed Time Reason: Patient unwilling/refused to participate without medical reason   Short Term Goals: Week 1:  OT Short Term Goal 1 (Week 1): STGs=LTGs secondary to estimated short LOS  Skilled Therapeutic Interventions/Progress Updates:    Pt resting in recliner upon arrival.  Pt stated that nursing had just changed dressing on residual leg and he was in 10/10 pain.  Pt declined BADLs this morning stating that he had already taken care of that.  Pt reported that he did not require any assistance after supplies and clothing provided.  Discussed discharge.  Discharge set for 10/13 in previous conference but patient stated he had been informed that discharge was for 10/12 and arrangements had been made.  CSW notified.  Pt stated he did not require a BSC.  Pt also stated he already had a way that he was using his tub/shower and did not need a tub transfer bench.  Pt instructed on HEP with theraband.  Pt stated he would continue with BUE therex after discharge.  Pt declined participating in transfer training.  Therapy Documentation Precautions:  Precautions Precautions: Fall Required Braces or Orthoses: Other Brace/Splint Other Brace/Splint: AFO RLE Restrictions Weight Bearing Restrictions: Yes LLE Weight Bearing: Non weight bearing General: General OT Amount of Missed Time: 15 Minutes  Pain: Pain Assessment Pain Assessment: 0-10 Pain Score: 10-Worst pain ever Pain Type: Surgical pain Pain Location: Leg Pain Orientation: Left Pain Descriptors / Indicators: Throbbing Pain Onset: Sudden Pain Intervention(s): RN made aware;Repositioned  See Function Navigator for Current Functional Status.   Therapy/Group:  Individual Therapy  Leroy Libman 04/25/2015, 10:51 AM

## 2015-04-25 NOTE — Discharge Summary (Signed)
Discharge summary job # [id number]

## 2015-04-25 NOTE — Progress Notes (Signed)
[place] PHYSICAL MEDICINE & REHABILITATION     PROGRESS NOTE    Subjective/Complaints: Feeling well. Pain controlled. Nausea has resolved    ROS: Pt denies fever, rash/itching, headache, blurred or double vision, nausea, vomiting, abdominal pain, diarrhea, chest pain, shortness of breath, palpitations, dysuria, dizziness, neck or back pain, bleeding, anxiety, or depression  Objective: Vital Signs: Blood pressure 109/47, pulse 91, temperature 98.3 F (36.8 C), temperature source Oral, resp. rate 18, weight 108.9 kg (240 lb 1.3 oz), SpO2 100 %. No results found.  Recent Labs  04/24/15 0806 04/25/15 0458  WBC 6.0 6.1  HGB 8.8* 9.2*  HCT 28.9* 30.8*  PLT 158 164    Recent Labs  04/24/15 0806  NA 137  K 4.5  CL 95*  GLUCOSE 159*  BUN 48*  CREATININE 11.98*  CALCIUM 9.8   CBG (last 3)   Recent Labs  04/24/15 1203 04/24/15 2036 04/25/15 0659  GLUCAP 116* 121* 213*    Wt Readings from Last 3 Encounters:  04/24/15 108.9 kg (240 lb 1.3 oz)  04/17/15 111 kg (244 lb 11.4 oz)  01/06/14 117.935 kg (260 lb)    Physical Exam:  Constitutional: no distress  HENT:  Head: Normocephalic and atraumatic.  Eyes: Conjunctivae and EOM are normal.  Neck: Normal range of motion. Neck supple. No thyromegaly present.  Cardiovascular: Normal rate and regular rhythm---no tachy this morning. no murmurs. Respiratory: Effort normal and breath sounds normal.  GI: Soft. Bowel sounds are normal. He exhibits no distension. Mild epigastric pain with palpation Musculoskeletal: Normal range of motion.  Left BKA dressing removed---only old blood on dressing. Wound completely dry RUE, LUE, LLE 5/5 strength RLE charcot foot,  Neurological: He is alert and oriented to person, place, and time.  Diminished sensation right foot, charcot deformity present.  Skin: Skin is warm and dry.  Left BKA site is dressed with coban. Left foot appears intact, dry skin Psychiatric: He has a normal  mood and affect, affect more dynamic  Assessment/Plan: 1. Functional deficits secondary to left BKA which require 3+ hours per day of interdisciplinary therapy in a comprehensive inpatient rehab setting. Physiatrist is providing close team supervision and 24 hour management of active medical problems listed below. Physiatrist and rehab team continue to assess barriers to discharge/monitor patient progress toward functional and medical goals.  Function:  Bathing Bathing position   Position: Wheelchair/chair at sink  Bathing parts Body parts bathed by patient: Right arm, Left arm, Chest, Abdomen, Right upper leg, Left upper leg, Right lower leg Body parts bathed by helper: Back  Bathing assist Assist Level: Set up      Upper Body Dressing/Undressing Upper body dressing   What is the patient wearing?: Pull over shirt/dress     Pull over shirt/dress - Perfomed by patient: Thread/unthread right sleeve, Pull shirt over trunk, Thread/unthread left sleeve, Put head through opening          Upper body assist Assist Level: No help, No cues      Lower Body Dressing/Undressing Lower body dressing Lower body dressing/undressing activity did not occur: Refused What is the patient wearing?: Underwear, Pants Underwear - Performed by patient: Thread/unthread right underwear leg, Thread/unthread left underwear leg, Pull underwear up/down   Pants- Performed by patient: Thread/unthread right pants leg, Thread/unthread left pants leg, Pull pants up/down, Fasten/unfasten pants       Socks - Performed by patient: Don/doff right sock   Shoes - Performed by patient: Don/doff right shoe, Fasten right  AFO - Performed by patient: Don/doff right AFO        Lower body assist Assist for lower body dressing: Touching or steadying assistance (Pt > 75%)      Toileting Toileting Toileting activity did not occur: No continent bowel/bladder event Toileting steps completed by patient: Adjust clothing  prior to toileting, Adjust clothing after toileting Toileting steps completed by helper: Performs perineal hygiene Toileting Assistive Devices: Grab bar or rail  Toileting assist Assist level: Supervision or verbal cues   Transfers Chair/bed transfer   Chair/bed transfer method: Squat pivot Chair/bed transfer assist level: No Help, no cues, assistive device, takes more than a reasonable amount of time Chair/bed transfer assistive device: Armrests     Locomotion Ambulation     Max distance: 30 Assist level: Touching or steadying assistance (Pt > 75%)   Wheelchair   Type: Manual Max wheelchair distance: 150 Assist Level: No help, No cues, assistive device, takes more than reasonable amount of time  Cognition Comprehension Comprehension assist level: Follows complex conversation/direction with no assist  Expression Expression assist level: Expresses complex ideas: With no assist  Social Interaction Social Interaction assist level: Interacts appropriately with others with medication or extra time (anti-anxiety, antidepressant).  Problem Solving Problem solving assist level: Solves complex problems: With extra time  Memory Memory assist level: Complete Independence: No helper   Medical Problem List and Plan: 1. Functional deficits secondary to left BKA secondary nonhealing ulcer 04/14/2015  -Cont CIR 2. DVT Prophylaxis/Anticoagulation: SCDs right lower extremity. OOB 3. Pain Management: Oxycodone and Robaxin as needed. Monitor with increased mobility 4. End-stage renal disease with hemodialysis. Follow-up per renal services. HD in PM after therapy day except for dc day tomorrow when HD will be early.  5. Neuropsych: This patient is capable of making decisions on his own behalf. 6. Skin/Wound Care: I removed post-op dressing today. Wound is clean----dry, use kerlix, ace wrap---educate family 7. Fluids/Electrolytes/Nutrition:   follow-up chemistries with HD 8. Acute on chronic anemia.  Continue Aranesp.   -Fe bolus,   -txfsn prn  9. Diabetes mellitus with peripheral neuropathy. Levemir 20 units daily, NovoLog 3 units 3 times a day, Prandin 0.5 mg twice a day. Sliding scale insulin.   - will need further adjustment of regimen, appropriate diet once home---sugars for the most part have been controlled  -diabetic education 10. Hypertension. Monitor with increased mobility. No current anti-hypertensive medication 11. Charcot right foot. Status post ankle fusion 2008 12. GI: vomiting,nausea  -resolved, hgb climbing, did have stool + x1 for OB. Will need further observation with HD labs once discharged  -continue protonix 20mg  bid---can change to 40mg  daily once home  -prn zofran  -adjustment of renal/meds per nephrology discretion  LOS (Days) 8 A FACE TO FACE EVALUATION WAS PERFORMED  Blease Capaldi T 04/25/2015 8:46 AM

## 2015-04-25 NOTE — Discharge Instructions (Signed)
Inpatient Rehab Discharge Instructions  [name] Discharge date and time: No discharge date for patient encounter.   Activities/Precautions/ Functional Status: Activity: activity as tolerated Diet: renal diet Wound Care: keep wound clean and dry Functional status:  ___ No restrictions     ___ Walk up steps independently ___ 24/7 supervision/assistance   ___ Walk up steps with assistance ___ Intermittent supervision/assistance  ___ Bathe/dress independently ___ Walk with walker     ___ Bathe/dress with assistance ___ Walk Independently    ___ Shower independently ___ Walk with assistance    ___ Shower with assistance ___ No alcohol     ___ Return to work/school ________    COMMUNITY REFERRALS UPON DISCHARGE:    Home Health:   PT     OT                      Agency:  [facility] Phone: [phone]   Medical Equipment/Items Ordered:  Wheelchair and rolling walker                                                      Agency/Supplier:  [facility] @ [phone]   GENERAL COMMUNITY RESOURCES FOR PATIENT/FAMILY:  Support Groups:  Amputee Support Group         Special Instructions: Continue dialysis as directed   My questions have been answered and I understand these instructions. I will adhere to these goals and the provided educational materials after my discharge from the hospital.  Patient/Caregiver Signature _______________________________ Date __________  Clinician Signature _______________________________________ Date __________  Please bring this form and your medication list with you to all your follow-up doctor's appointments.

## 2015-04-25 NOTE — Progress Notes (Signed)
Subjective:   Ready to go home- DC planned for tomorrow. Stump pain- dressing changed this am  Objective Filed Vitals:   04/24/15 1830 04/24/15 1859 04/24/15 1930 04/25/15 0437  BP: 98/60 100/64 102/62 109/47  Pulse: 94 95 94 91  Temp:    98.3 F (36.8 C)  TempSrc:    Oral  Resp:   18 18  Weight:   108.9 kg (240 lb 1.3 oz)   SpO2:    100%   Physical Exam General: alert and oriented. No acute distress Heart: RRR  Lungs: CTA, unlabored  Abdomen: soft, nontender +BS  Extremities: no edema. L BKA dressing intact Dialysis Access:  L Hero  Dialysis Orders: [facility] 4.5 hr - -rarely runs full time EDW 116- leaving a little below 2 K 2.25 Ca 450/800 HERO heparin 5000 with 2500 mid Mircera 150 last 9/21 no Fe calcitriol 2.5 Recent labs; Hgb 8.6 25% sat ferritin 1300 (July) iPTH 216 Ca upper 9s P 6-8  Assessment: 1. nonhealing foot wound- L BKA Dr. Sharol Given - now in rehab 2. N/V/ heartburn - on PPI/ H2blockers per primary, constipated resolved. hgb stable 3. ESRD - MWF - next HD early tomorrow AM 4. Hypertension/volume - BP's lowish, no meds. under prior dry wt which is appropriate drop after leg amp 5. Anemia - Hgb up to 9.2- trending up, dropped after surgery. [place] +, inc'd ESA, cont Fe bolus. No gross GIB. Primary team following.  6. Metabolic bone disease - Chronically elevated phosphorus iPTH 216- takes 4 phoslo and 4 tums 7. Nutrition - renal diet and multivit. Alb 2.9- appetite improving 8. Thrombocytopenia - follow platelets. 164 yesterday 9. DM - per primary [place], NP [place] [phone] 04/25/2015,10:18 AM  LOS: 8 days    Additional Objective Labs: Basic Metabolic Panel:  Recent Labs Lab 04/19/15 1330 04/21/15 1533 04/24/15 0806  NA 135 140 137  K 4.3 3.7 4.5  CL 89* 92* 95*  CO2 30 34* 26  GLUCOSE 149* 181* 159*  BUN 48* 44* 48*  CREATININE 11.03* 10.97* 11.98*  CALCIUM 9.5 9.9 9.8  PHOS  9.0* 8.1*  --    Liver Function Tests:  Recent Labs Lab 04/19/15 1330 04/21/15 1533 04/24/15 0806  AST  --   --  18  ALT  --   --  <5*  ALKPHOS  --   --  50  BILITOT  --   --  0.3  PROT  --   --  6.8  ALBUMIN 2.8* 2.9* 2.9*    Recent Labs Lab 04/24/15 0806  LIPASE 150*   CBC:  Recent Labs Lab 04/20/15 1209 04/21/15 1533 04/22/15 1430 04/24/15 0806 04/25/15 0458  WBC 7.5 5.7 6.4 6.0 6.1  NEUTROABS  --   --   --   --  4.0  HGB 8.5* 7.8* 8.7* 8.8* 9.2*  HCT 29.2* 26.6* 29.8* 28.9* 30.8*  MCV 90.4 89.3 90.9 89.8 91.4  PLT 174 165 161 158 164   Blood Culture    Component Value Date/Time   SDES WOUND EXIT SITE ARM RIGHT 11/06/2012 1820   SDES GRAFT ARM RIGHT 11/06/2012 1820   SDES WOUND EXIT SITE ARM RIGHT 11/06/2012 1820   SPECREQUEST  11/06/2012 1820    PATIENT ON FOLLOWING VANCOMYCIN INFECTED AV GRAFT SWAB   SPECREQUEST PATIENT ON FOLLOWING VANCOMYCIN AV GRAFT 11/06/2012 1820   SPECREQUEST  11/06/2012 1820    PATIENT ON FOLLOWING VANCOMYCIN INFECTED AV  GRAFT SWAB   CULT  11/06/2012 1820    MODERATE METHICILLIN RESISTANT STAPHYLOCOCCUS AUREUS Note: RIFAMPIN AND GENTAMICIN SHOULD NOT BE USED AS SINGLE DRUGS FOR TREATMENT OF STAPH INFECTIONS. This organism DOES NOT demonstrate inducible Clindamycin resistance in vitro. CRITICAL RESULT CALLED TO, READ BACK BY AND VERIFIED WITH: JAMIE Y 4/28 @900   BY REAMM   CULT  11/06/2012 1820    ABUNDANT STAPHYLOCOCCUS AUREUS Note: SUSCEPTIBILITIES PERFORMED ON PREVIOUS CULTURE WITHIN THE LAST 5 DAYS.   CULT NO ANAEROBES ISOLATED 11/06/2012 1820   REPTSTATUS 11/09/2012 FINAL 11/06/2012 1820   REPTSTATUS 11/10/2012 FINAL 11/06/2012 1820   REPTSTATUS 11/11/2012 FINAL 11/06/2012 1820    Cardiac Enzymes: No results for input(s): CKTOTAL, CKMB, CKMBINDEX, TROPONINI in the last 168 hours. CBG:  Recent Labs Lab 04/23/15 2137 04/24/15 0635 04/24/15 1203 04/24/15 2036 04/25/15 0659  GLUCAP 70 157* 116* 121* 213*   Iron  Studies: No results for input(s): IRON, TIBC, TRANSFERRIN, FERRITIN in the last 72 hours. @lablastinr3 @ Studies/Results: No results found. Medications:   . aspirin EC  325 mg Oral Daily  . calcitRIOL  2.5 mcg Oral Q M,W,F-HD  . calcium carbonate  4 tablet Oral TID WC  . darbepoetin (ARANESP) injection - DIALYSIS  200 mcg Intravenous Q Mon-HD  . docusate sodium  100 mg Oral BID  . famotidine  20 mg Oral QHS  . ferric gluconate (FERRLECIT/NULECIT) IV  125 mg Intravenous Q M,W,F-HD  . insulin aspart  0-9 Units Subcutaneous TID WC  . insulin aspart  3 Units Subcutaneous TID WC  . insulin detemir  20 Units Subcutaneous QHS  . pantoprazole  40 mg Oral BID

## 2015-04-25 NOTE — Progress Notes (Signed)
Occupational Therapy Discharge Summary and OT Intervention  Patient Details  Name: [name] MRN: [id number] Date of Birth: June 14, 1966  Today's Date: 04/25/2015 OT Missed Time: 18 Minutes Missed Time Reason: Patient unwilling/refused to participate without medical reason   Patient has met 3 of 9 long term goals due to improved activity tolerance, improved balance and ability to compensate for deficits.  Patient to discharge at overall Supervision level. No family has been present during sessions for education  Reasons goals not met: Pt's multiple therapy refusals  Recommendation:  Patient will benefit from ongoing skilled OT services in home health setting to continue to advance functional skills in the area of BADL and iADL.  Equipment: Tub transfer bench  Reasons for discharge: discharge from hospital  Patient/family agrees with progress made and goals achieved: Yes  OT Discharge Precautions/Restrictions  Precautions Precautions: Fall Required Braces or Orthoses: Other Brace/Splint Other Brace/Splint: AFO RLE Restrictions Weight Bearing Restrictions: Yes LLE Weight Bearing: Non weight bearing  Pain Pain Assessment Pain Assessment: 0-10 Pain Score: 10-Worst pain ever Pain Type: Surgical pain Pain Location: Leg Pain Orientation: Left Pain Descriptors / Indicators: Grimacing;Restless Pain Onset: Sudden Pain Intervention(s): Emotional support;RN made aware Vision/Perception  Vision- History Baseline Vision/History: Wears glasses Wears Glasses: Reading only Patient Visual Report: No change from baseline  Cognition Overall Cognitive Status: Within Functional Limits for tasks assessed Arousal/Alertness: Awake/alert Orientation Level: Oriented X4 Memory: Appears intact Awareness: Appears intact Problem Solving: Appears intact Safety/Judgment: Appears intact Sensation Sensation Light Touch: Appears Intact Hot/Cold: Appears Intact Coordination Gross Motor  Movements are Fluid and Coordinated: Yes Fine Motor Movements are Fluid and Coordinated: Yes Motor  Motor Motor: Abnormal postural alignment and control Mobility  Bed Mobility Bed Mobility: Sit to Supine;Supine to Sit Supine to Sit: 6: Modified independent (Device/Increase time) Sit to Supine: 6: Modified independent (Device/Increase time) Transfers Transfers: Sit to Stand;Stand to Sit Sit to Stand: 6: Modified independent (Device/Increase time) Stand to Sit: 6: Modified independent (Device/Increase time)  Trunk/Postural Assessment  Cervical Assessment Cervical Assessment: Exceptions to [facility] (forward head) Thoracic Assessment Thoracic Assessment: Exceptions to [facility] (rounded shoulders) Lumbar Assessment Lumbar Assessment: Exceptions to [facility] (posterior pelvic tilt) Postural Control Postural Control: Within Functional Limits  Balance Balance Balance Assessed: Yes Static Sitting Balance Static Sitting - Balance Support: Feet unsupported;No upper extremity supported Static Sitting - Level of Assistance: 6: Modified independent (Device/Increase time) Dynamic Sitting Balance Dynamic Sitting - Balance Support: Feet unsupported;No upper extremity supported Dynamic Sitting - Level of Assistance: 6: Modified independent (Device/Increase time) Extremity/Trunk Assessment RUE Assessment RUE Assessment: Within Functional Limits LUE Assessment LUE Assessment: Within Functional Limits   See Function Navigator for Current Functional Status.  [name] 04/25/2015, 12:46 PM

## 2015-04-26 DIAGNOSIS — N185 Chronic kidney disease, stage 5: Secondary | ICD-10-CM

## 2015-04-26 LAB — RENAL FUNCTION PANEL
ALBUMIN: 2.9 g/dL — AB (ref 3.5–5.0)
Anion gap: 18 — ABNORMAL HIGH (ref 5–15)
BUN: 37 mg/dL — AB (ref 6–20)
CALCIUM: 10.5 mg/dL — AB (ref 8.9–10.3)
CO2: 24 mmol/L (ref 22–32)
CREATININE: 10.26 mg/dL — AB (ref 0.61–1.24)
Chloride: 94 mmol/L — ABNORMAL LOW (ref 101–111)
GFR calc Af Amer: 6 mL/min — ABNORMAL LOW (ref >60)
GFR calc non Af Amer: 5 mL/min — ABNORMAL LOW (ref >60)
GLUCOSE: 150 mg/dL — AB (ref 65–99)
PHOSPHORUS: 6.5 mg/dL — AB (ref 2.5–4.6)
Potassium: 4.4 mmol/L (ref 3.5–5.1)
SODIUM: 136 mmol/L (ref 135–145)

## 2015-04-26 MED ORDER — OXYCODONE HCL 5 MG PO TABS
ORAL_TABLET | ORAL | Status: AC
  Filled 2015-04-26: qty 2

## 2015-04-26 MED ORDER — ALTEPLASE 2 MG IJ SOLR
2.0000 mg | Freq: Once | INTRAMUSCULAR | Status: DC | PRN
  Filled 2015-04-26: qty 2

## 2015-04-26 MED ORDER — HEPARIN SODIUM (PORCINE) 1000 UNIT/ML DIALYSIS
20.0000 [IU]/kg | Freq: Once | INTRAMUSCULAR | Status: DC
Start: 2015-04-27 — End: 2015-04-26
  Filled 2015-04-26: qty 3

## 2015-04-26 MED ORDER — LIDOCAINE-PRILOCAINE 2.5-2.5 % EX CREA: 1.0000 "application " | TOPICAL_CREAM | CUTANEOUS | Status: DC | PRN

## 2015-04-26 MED ORDER — SODIUM CHLORIDE 0.9 % IV SOLN: 100.0000 mL | INTRAVENOUS | Status: DC | PRN

## 2015-04-26 MED ORDER — METHOCARBAMOL 500 MG PO TABS: 500.0000 mg | ORAL_TABLET | Freq: Four times a day (QID) | ORAL | Status: DC | PRN

## 2015-04-26 MED ORDER — LIDOCAINE HCL (PF) 1 % IJ SOLN: 5.0000 mL | INTRAMUSCULAR | Status: DC | PRN

## 2015-04-26 MED ORDER — PENTAFLUOROPROP-TETRAFLUOROETH EX AERO
1.0000 "application " | INHALATION_SPRAY | CUTANEOUS | Status: DC | PRN
  Administered 2015-04-26: 1 via TOPICAL

## 2015-04-26 MED ORDER — FAMOTIDINE 20 MG PO TABS: 20.0000 mg | ORAL_TABLET | Freq: Every day | ORAL | Status: DC

## 2015-04-26 MED ORDER — PANTOPRAZOLE SODIUM 40 MG PO TBEC
40.0000 mg | DELAYED_RELEASE_TABLET | Freq: Two times a day (BID) | ORAL | Status: DC
Start: 2015-04-26 — End: 2015-08-19

## 2015-04-26 MED ORDER — CALCIUM CARBONATE ANTACID 500 MG PO CHEW: 4.0000 | CHEWABLE_TABLET | Freq: Three times a day (TID) | ORAL | Status: DC

## 2015-04-26 MED ORDER — NEPHRO-VITE 0.8 MG PO TABS: 1.0000 | ORAL_TABLET | Freq: Every day | ORAL | Status: AC

## 2015-04-26 MED ORDER — CALCITRIOL 0.5 MCG PO CAPS: 2.5000 ug | ORAL_CAPSULE | ORAL | Status: DC

## 2015-04-26 MED ORDER — OXYCODONE HCL 5 MG PO TABS: 5.0000 mg | ORAL_TABLET | ORAL | Status: DC | PRN

## 2015-04-26 MED ORDER — HEPARIN SODIUM (PORCINE) 1000 UNIT/ML DIALYSIS
1000.0000 [IU] | INTRAMUSCULAR | Status: DC | PRN
  Filled 2015-04-26: qty 1

## 2015-04-26 MED ORDER — CALCITRIOL 0.5 MCG PO CAPS
ORAL_CAPSULE | ORAL | Status: AC
  Administered 2015-04-26: 2.5 ug via ORAL
  Filled 2015-04-26: qty 5

## 2015-04-26 MED ORDER — ASPIRIN 325 MG PO TBEC: 325.0000 mg | DELAYED_RELEASE_TABLET | Freq: Every day | ORAL | Status: DC

## 2015-04-26 MED ORDER — INSULIN DETEMIR 100 UNIT/ML ~~LOC~~ SOLN: 20.0000 [IU] | Freq: Every day | SUBCUTANEOUS | Status: AC

## 2015-04-26 MED ORDER — PENTAFLUOROPROP-TETRAFLUOROETH EX AERO
INHALATION_SPRAY | CUTANEOUS | Status: AC
  Administered 2015-04-26: 1 via TOPICAL
  Filled 2015-04-26: qty 103.5

## 2015-04-26 NOTE — Discharge Summary (Signed)
[name], [name] NO.:  [id number]  MEDICAL RECORD NO.:  [id number]  LOCATION:  4M04C                        FACILITY:  [facility]  PHYSICIAN:  Meredith Staggers, M.D.DATE OF BIRTH:  12-07-1965  DATE OF ADMISSION:  04/17/2015 DATE OF DISCHARGE:  04/26/2015                              DISCHARGE SUMMARY   DISCHARGE DIAGNOSES: 1. Functional deficits secondary to left below-knee amputation due to     nonhealing ulcer, April 14, 2015. 2. Pain management. 3. End-stage renal disease, on hemodialysis. 4. Acute-on-chronic anemia. 5. Diabetes mellitus with peripheral neuropathy. 6. Hypertension. 7. Charcot right foot with ankle fusion. 8. Constipation.  HISTORY OF PRESENT ILLNESS:  This is a 49 year old right-handed male, history of end-stage renal disease, hemodialysis; diabetes mellitus; left first and second ray amputations in August of 2016, lives with his spouse independent with occasional cane prior to admission.  Presented on April 14, 2015, with left foot dehiscence and limb was not felt to be salvageable.  Underwent left transtibial amputation on April 14, 2015, per Dr. Sharol Jefferson.  Hospital course, pain management.  Acute blood loss anemia, monitored with hemodialysis.  Physical and occupational therapy ongoing.  The patient was admitted for comprehensive rehab program.  PAST MEDICAL HISTORY:  See discharge diagnoses.  SOCIAL HISTORY:  Lives with family.  Occasional use of a cane, crutches prior to admission.  FUNCTIONAL STATUS:  Upon admission to [facility], minimal assist to ambulate rolling walker 10 feet; minimal-to-guard sit to stand; min-to- mod assist, activities of daily living.  PHYSICAL EXAMINATION:  VITAL SIGNS:  Blood pressure 95/35, pulse 80, temperature 98, respirations 20. GENERAL:  This was an alert male, in no acute distress, oriented x3. LUNGS:  Clear to auscultation. CARDIAC:  Regular rate and rhythm. ABDOMEN:  Soft,  nontender.  Good bowel sounds.  BKA site was clean and dry, appropriately tender.  REHABILITATION HOSPITAL COURSE:  The patient was admitted to Inpatient Rehab Services with therapies initiated on a 3-hour daily basis consisting of physical therapy, occupational therapy, and rehabilitation nursing.  The following issues were addressed during the patient's rehabilitation stay.  Pertaining to [name] left BKA on April 14, 2015, he would follow up with Orthopedic Services.  Site was appropriately tender, dressed, as advised education with family.  He would follow up Outpatient Clinic.  Pain management with use of oxycodone and Robaxin with good results.  He remained on hemodialysis as per Renal Services.  Diabetes mellitus with peripheral neuropathy. Blood sugars had some variations, but overall controlled, full diabetic teaching.  He continued on Levemir insulin.  Acute-on-chronic anemia, continued iron supplement.  He remained asymptomatic.  The patient received weekly collaborative interdisciplinary team conferences to discuss estimated length of stay, family teaching, and any barriers to discharge.  Sessions focused on wheelchair mobility, endurance and whole- body strengthening.  Perform a squat pivot transfers to and from wheelchair with setup only.  Performs 10 minutes of NuStep with rest breaks.  Perform ambulatory transfers with rolling walker.  Transferred back to his wheelchair at end of session without any difficulty.  Gather his belongings for activities of daily living and homemaking, required some encouragement at times to participate, propels his wheelchair, modified independence.  Performing wheelchair setup for squat pivot transfers for all bathing, dressing and grooming.  Full family teaching was completed and plan discharge to home.  DISCHARGE MEDICATIONS: 1. Aspirin 325 mg p.o. daily. 2. Colace 100 mg p.o. b.i.d. 3. Pepcid 20 mg p.o. daily. 4. Levemir  insulin 20 units daily. 5. Robaxin 500 mg every 6 hours as needed for muscle spasms. 6. Oxycodone immediate release 5-10 mg every 3 hours as needed for     pain, dispense of 90 tablets. 7. Protonix 40 mg p.o. b.i.d.  DIET:  Diabetic renal diet.  FOLLOWUP:  The patient would follow up with Dr. Alger Jefferson at the Outpatient Rehab Service Office to be contacted for appointment; Dr. Meridee Jefferson, Orthopedic Services in 2 weeks; Dr. Roney Jefferson, hemodialysis; Dr. Delrae Jefferson, PCP, follow up medically.  Dressing changes as indicated for BKA site with Kerlix and Ace wrap change as needed.     Lauraine Rinne, P.A.   ______________________________ Meredith Staggers, M.D.    DA/MEDQ  D:  04/25/2015  T:  04/26/2015  Job:  [id number]  cc:   Sol Blazing, M.D. Newt Minion, MD Katina Degree, M.D.

## 2015-04-26 NOTE — Progress Notes (Signed)
Pt. Verbalized that he has done wound dressing change, and that he does not have questions about dressing changes.Pt. Got d/c orders and instructions.Pt. Ready to d/c home with family.

## 2015-04-26 NOTE — Progress Notes (Signed)
[facility] PHYSICAL MEDICINE & REHABILITATION     PROGRESS NOTE    Subjective/Complaints: No problems yesterday. In early HD this morning in prep of discharge.  ROS: Pt denies fever, rash/itching, headache, blurred or double vision, nausea, vomiting, abdominal pain, diarrhea, chest pain, shortness of breath, palpitations, dysuria, dizziness, neck or back pain, bleeding, anxiety, or depression  Objective: Vital Signs: Blood pressure 92/49, pulse 89, temperature 98.1 F (36.7 C), temperature source Oral, resp. rate 16, weight 110.6 kg (243 lb 13.3 oz), SpO2 100 %. No results found.  Recent Labs  04/24/15 0806 04/25/15 0458  WBC 6.0 6.1  HGB 8.8* 9.2*  HCT 28.9* 30.8*  PLT 158 164    Recent Labs  04/24/15 0806 04/26/15 0346  NA 137 136  K 4.5 4.4  CL 95* 94*  GLUCOSE 159* 150*  BUN 48* 37*  CREATININE 11.98* 10.26*  CALCIUM 9.8 10.5*   CBG (last 3)   Recent Labs  04/25/15 1156 04/25/15 1636 04/25/15 2127  GLUCAP 119* 106* 146*    Wt Readings from Last 3 Encounters:  04/26/15 110.6 kg (243 lb 13.3 oz)  04/17/15 111 kg (244 lb 11.4 oz)  01/06/14 117.935 kg (260 lb)    Physical Exam:  Constitutional: no distress  HENT:  Head: Normocephalic and atraumatic.  Eyes: Conjunctivae and EOM are normal.  Neck: Normal range of motion. Neck supple. No thyromegaly present.  Cardiovascular: Normal rate and regular rhythm---no tachy this morning. no murmurs. Respiratory: Effort normal and breath sounds normal.  GI: Soft. Bowel sounds are normal. He exhibits no distension. Mild epigastric pain with palpation Musculoskeletal: Normal range of motion.  Left BKA wound dry and intact RUE, LUE, LLE 5/5 strength RLE charcot foot,  Neurological: He is alert and oriented to person, place, and time.  Diminished sensation right foot, charcot deformity present.  Skin: Skin is warm and dry.  left foot appears intact, dry skin Psychiatric: He has a normal mood and affect,  affect more dynamic  Assessment/Plan: 1. Functional deficits secondary to left BKA which require 3+ hours per day of interdisciplinary therapy in a comprehensive inpatient rehab setting. Physiatrist is providing close team supervision and 24 hour management of active medical problems listed below. Physiatrist and rehab team continue to assess barriers to discharge/monitor patient progress toward functional and medical goals.  Function:  Bathing Bathing position   Position: Wheelchair/chair at sink  Bathing parts Body parts bathed by patient: Right arm, Left arm, Chest, Abdomen, Right upper leg, Left upper leg, Right lower leg, Front perineal area, Buttocks Body parts bathed by helper: Back  Bathing assist Assist Level: Set up      Upper Body Dressing/Undressing Upper body dressing   What is the patient wearing?: Pull over shirt/dress     Pull over shirt/dress - Perfomed by patient: Thread/unthread right sleeve, Pull shirt over trunk, Thread/unthread left sleeve, Put head through opening          Upper body assist Assist Level: No help, No cues      Lower Body Dressing/Undressing Lower body dressing Lower body dressing/undressing activity did not occur: Refused What is the patient wearing?: Underwear, Pants Underwear - Performed by patient: Thread/unthread right underwear leg, Thread/unthread left underwear leg, Pull underwear up/down   Pants- Performed by patient: Thread/unthread right pants leg, Thread/unthread left pants leg, Pull pants up/down, Fasten/unfasten pants       Socks - Performed by patient: Don/doff right sock   Shoes - Performed by patient: Don/doff right shoe,  Fasten right   AFO - Performed by patient: Don/doff right AFO        Lower body assist Assist for lower body dressing: Set up      [place] activity did not occur: No continent bowel/bladder event Toileting steps completed by patient: Adjust clothing prior to toileting,  Adjust clothing after toileting, Performs perineal hygiene Toileting steps completed by helper: Performs perineal hygiene Toileting Assistive Devices: Grab bar or rail  Toileting assist Assist level: Supervision or verbal cues   Transfers Chair/bed transfer   Chair/bed transfer method: Squat pivot Chair/bed transfer assist level: No Help, no cues, assistive device, takes more than a reasonable amount of time Chair/bed transfer assistive device: Armrests     Locomotion Ambulation     Max distance: 30 Assist level: Touching or steadying assistance (Pt > 75%)   Wheelchair   Type: Manual Max wheelchair distance: 150 Assist Level: No help, No cues, assistive device, takes more than reasonable amount of time  Cognition Comprehension Comprehension assist level: Follows complex conversation/direction with no assist  Expression Expression assist level: Expresses complex ideas: With no assist  Social Interaction Social Interaction assist level: Interacts appropriately with others with medication or extra time (anti-anxiety, antidepressant).  Problem Solving Problem solving assist level: Solves complex problems: With extra time  Memory Memory assist level: Complete Independence: No helper   Medical Problem List and Plan: 1. Functional deficits secondary to left BKA secondary nonhealing ulcer 04/14/2015  -dc home today after HD.  -will call for appt after dc with me in pros clinic. Needs surgical follow up also 2. DVT Prophylaxis/Anticoagulation: SCDs right lower extremity. OOB 3. Pain Management: Oxycodone and Robaxin as needed. Monitor with increased mobility 4. End-stage renal disease with hemodialysis. Follow-up per renal services. HD in PM after therapy day except for dc day tomorrow when HD will be early.  5. Neuropsych: This patient is capable of making decisions on his own behalf. 6. Skin/Wound Care: I removed post-op dressing today. Wound is clean----dry, use kerlix, ace  wrap---educate family 7. Fluids/Electrolytes/Nutrition:   follow-up chemistries with HD 8. Acute on chronic anemia. Continue Aranesp.   -Fe bolus,   -txfsn prn  9. Diabetes mellitus with peripheral neuropathy. Levemir 20 units daily, NovoLog 3 units 3 times a day, Prandin 0.5 mg twice a day. Sliding scale insulin.   - will need further adjustment of regimen, appropriate diet once home---sugars for the most part have been controlled  -diabetic education and diet adherence once home 10. Hypertension. Monitor with increased mobility. No current anti-hypertensive medication 11. Charcot right foot. Status post ankle fusion 2008 12. GI: vomiting,nausea  -resolved, hgb climbing, did have stool + x1 for OB. Will need further observation with HD labs once discharged  -continue protonix  can change to 40mg  daily    -prn zofran   n  LOS (Days) 9 A FACE TO FACE EVALUATION WAS PERFORMED  SWARTZ,ZACHARY T 04/26/2015 7:41 AM

## 2015-04-26 NOTE — Patient Care Conference (Signed)
Inpatient RehabilitationTeam Conference and Plan of Care Update Date: 04/25/2015   Time: 2:35 PM    Patient Name: [name]      Medical Record Number: [id number]  Date of Birth: September 10, 1965 Sex: Male         Room/Bed: 4M04C/4M04C-01 Payor Info: Payor: MEDICARE / Plan: MEDICARE PART A AND B / Product Type: *No Product type* /    Admitting Diagnosis: new L BKA  HD MWF  Admit Date/Time:  04/17/2015  5:58 PM Admission Comments: No comment available   Primary Diagnosis:  Status post below knee amputation of left lower extremity (HCC) Principal Problem: Status post below knee amputation of left lower extremity [facility])  Patient Active Problem List   Diagnosis Date Noted  . Diabetes mellitus type 2, uncontrolled ([place]) 04/18/2015  . Status post below knee amputation of left lower extremity ([place]) 04/17/2015  . S/P BKA (below knee amputation) unilateral ([place]) 04/14/2015  . 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 ESRD on dialysis ([place]) 10/23/2012  . DIASTOLIC DYSFUNCTION 0000000  . PVD 10/22/2007  . ESRD 10/22/2007  . HYPERPARATHYROIDISM, HX OF 10/22/2007    Expected Discharge Date: Expected Discharge Date: 04/26/15  Team Members Present: Physician leading conference: Dr. Alger Simons Social Worker Present: Lennart Pall, LCSW Nurse Present: Dorien Chihuahua, RN PT Present: Dwyane Dee, Lacy Duverney, PT OT Present: Willeen Cass, Rhetta Mura, OT;Roanna Epley, COTA SLP Present: Weston Anna, SLP PPS Coordinator present : Daiva Nakayama, RN, CRRN     Current Status/Progress Goal Weekly Team Focus  Medical   wound clean, dry. pain issues.   improve activity tolerance  pain control, wound care   Bowel/Bladder   Cont of bowel. Anuric due to ESRD  To continue continent with min. assisst.  maintain reg emptying of bowel   Swallow/Nutrition/ Hydration             ADL's   supervision/set up - mod I for self care and  functional transfers  Mod I overall with supervision for shower transfer  pt education, functional transfers, self care, balance   Mobility   supervision/mod I  mod I  gait, stairs, balance, HEP   Communication             Safety/Cognition/ Behavioral Observations            Pain   Pain levels 10 today after removal of compression wrap. OXY IR 10mg  given every 3 hours PRN as prescribed  To keep pain levels less than 3.on 1 to 10 scale.  assess pain q shift and PRN   Skin   L stump incision with staples, MD changed dressing this am and applied 4x4 and ace wrap.  To keep skin free of infection and breackdown.  assess skin qshift and PRN    Rehab Goals Patient on target to meet rehab goals: Yes *See Care Plan and progress notes for long and short-term goals.  Barriers to Discharge: pain, wound care, manipulation    Possible Resolutions to Barriers:  education, pacing    Discharge Planning/Teaching Needs:  Home with wife and daughter who can provide intermittent assistance.  Wife operates daycare out of their home.      Team Discussion:  Ready for d/c tomorrow following HD.  No concerns.  Revisions to Treatment Plan:  Change in d/c date to 10/12   Continued Need for Acute Rehabilitation Level of Care: The patient requires daily medical management by a physician with specialized training in  physical medicine and rehabilitation for the following conditions: Daily direction of a multidisciplinary physical rehabilitation program to ensure safe treatment while eliciting the highest outcome that is of practical value to the patient.: Yes Daily medical management of patient stability for increased activity during participation in an intensive rehabilitation regime.: Yes Daily analysis of laboratory values and/or radiology reports with any subsequent need for medication adjustment of medical intervention for : Neurological problems;Post surgical problems  Taiya Nutting 04/26/2015, 10:39 AM

## 2015-04-26 NOTE — Procedures (Signed)
I was present at this dialysis session. I have reviewed the session itself and made appropriate changes.   For DC after HD today.  Use post weight as new EDW.  Pearson Grippe  MD 04/26/2015, 7:51 AM

## 2015-04-26 NOTE — Progress Notes (Signed)
Social Work  Discharge Note  The overall goal for the admission was met for:   Discharge location: Yes - home with wife and daughter to provide intermittent   Length of Stay: Yes - 9 days  Discharge activity level: Yes - modified independent  Home/community participation: Yes  Services provided included: MD, RD, PT, OT, RN, Pharmacy and [place]: Medicare  Follow-up services arranged: Home Health: PT, OT via [place], DME: 18x18 lightweight w/c with L amp pad, cushion, rolling walker via [place] and Patient/Family request agency HH: [facility], DME: no pref  Comments (or additional information):  Patient/Family verbalized understanding of follow-up arrangements: Yes  Individual responsible for coordination of the follow-up plan: pt  Confirmed correct DME delivered: [name] 04/26/2015    [place], [place]

## 2015-04-28 DIAGNOSIS — N2581 Secondary hyperparathyroidism of renal origin: Secondary | ICD-10-CM | POA: Diagnosis not present

## 2015-04-28 DIAGNOSIS — N186 End stage renal disease: Secondary | ICD-10-CM | POA: Diagnosis not present

## 2015-04-28 DIAGNOSIS — D509 Iron deficiency anemia, unspecified: Secondary | ICD-10-CM | POA: Diagnosis not present

## 2015-04-28 DIAGNOSIS — D631 Anemia in chronic kidney disease: Secondary | ICD-10-CM | POA: Diagnosis not present

## 2015-05-01 DIAGNOSIS — D631 Anemia in chronic kidney disease: Secondary | ICD-10-CM | POA: Diagnosis not present

## 2015-05-01 DIAGNOSIS — D509 Iron deficiency anemia, unspecified: Secondary | ICD-10-CM | POA: Diagnosis not present

## 2015-05-01 DIAGNOSIS — N2581 Secondary hyperparathyroidism of renal origin: Secondary | ICD-10-CM | POA: Diagnosis not present

## 2015-05-01 DIAGNOSIS — N186 End stage renal disease: Secondary | ICD-10-CM | POA: Diagnosis not present

## 2015-05-03 DIAGNOSIS — D631 Anemia in chronic kidney disease: Secondary | ICD-10-CM | POA: Diagnosis not present

## 2015-05-03 DIAGNOSIS — N2581 Secondary hyperparathyroidism of renal origin: Secondary | ICD-10-CM | POA: Diagnosis not present

## 2015-05-03 DIAGNOSIS — D509 Iron deficiency anemia, unspecified: Secondary | ICD-10-CM | POA: Diagnosis not present

## 2015-05-03 DIAGNOSIS — N186 End stage renal disease: Secondary | ICD-10-CM | POA: Diagnosis not present

## 2015-05-05 DIAGNOSIS — Z992 Dependence on renal dialysis: Secondary | ICD-10-CM | POA: Diagnosis not present

## 2015-05-05 DIAGNOSIS — D631 Anemia in chronic kidney disease: Secondary | ICD-10-CM | POA: Diagnosis not present

## 2015-05-05 DIAGNOSIS — T82858D Stenosis of vascular prosthetic devices, implants and grafts, subsequent encounter: Secondary | ICD-10-CM | POA: Diagnosis not present

## 2015-05-05 DIAGNOSIS — D509 Iron deficiency anemia, unspecified: Secondary | ICD-10-CM | POA: Diagnosis not present

## 2015-05-05 DIAGNOSIS — N186 End stage renal disease: Secondary | ICD-10-CM | POA: Diagnosis not present

## 2015-05-05 DIAGNOSIS — N2581 Secondary hyperparathyroidism of renal origin: Secondary | ICD-10-CM | POA: Diagnosis not present

## 2015-05-05 DIAGNOSIS — I871 Compression of vein: Secondary | ICD-10-CM | POA: Diagnosis not present

## 2015-05-08 DIAGNOSIS — N186 End stage renal disease: Secondary | ICD-10-CM | POA: Diagnosis not present

## 2015-05-08 DIAGNOSIS — Z4802 Encounter for removal of sutures: Secondary | ICD-10-CM | POA: Insufficient documentation

## 2015-05-08 DIAGNOSIS — D631 Anemia in chronic kidney disease: Secondary | ICD-10-CM | POA: Diagnosis not present

## 2015-05-08 DIAGNOSIS — N2581 Secondary hyperparathyroidism of renal origin: Secondary | ICD-10-CM | POA: Diagnosis not present

## 2015-05-08 DIAGNOSIS — L299 Pruritus, unspecified: Secondary | ICD-10-CM | POA: Insufficient documentation

## 2015-05-08 DIAGNOSIS — D509 Iron deficiency anemia, unspecified: Secondary | ICD-10-CM | POA: Diagnosis not present

## 2015-05-10 DIAGNOSIS — N186 End stage renal disease: Secondary | ICD-10-CM | POA: Diagnosis not present

## 2015-05-10 DIAGNOSIS — D509 Iron deficiency anemia, unspecified: Secondary | ICD-10-CM | POA: Diagnosis not present

## 2015-05-10 DIAGNOSIS — N2581 Secondary hyperparathyroidism of renal origin: Secondary | ICD-10-CM | POA: Diagnosis not present

## 2015-05-10 DIAGNOSIS — D631 Anemia in chronic kidney disease: Secondary | ICD-10-CM | POA: Diagnosis not present

## 2015-05-10 DIAGNOSIS — E1129 Type 2 diabetes mellitus with other diabetic kidney complication: Secondary | ICD-10-CM | POA: Diagnosis not present

## 2015-05-11 DIAGNOSIS — E119 Type 2 diabetes mellitus without complications: Secondary | ICD-10-CM | POA: Diagnosis not present

## 2015-05-11 DIAGNOSIS — N186 End stage renal disease: Secondary | ICD-10-CM | POA: Diagnosis not present

## 2015-05-11 DIAGNOSIS — Z89422 Acquired absence of other left toe(s): Secondary | ICD-10-CM | POA: Diagnosis not present

## 2015-05-11 DIAGNOSIS — T8781 Dehiscence of amputation stump: Secondary | ICD-10-CM | POA: Diagnosis not present

## 2015-05-11 DIAGNOSIS — Z89412 Acquired absence of left great toe: Secondary | ICD-10-CM | POA: Diagnosis not present

## 2015-05-11 DIAGNOSIS — R531 Weakness: Secondary | ICD-10-CM | POA: Diagnosis not present

## 2015-05-12 DIAGNOSIS — N186 End stage renal disease: Secondary | ICD-10-CM | POA: Diagnosis not present

## 2015-05-12 DIAGNOSIS — N2581 Secondary hyperparathyroidism of renal origin: Secondary | ICD-10-CM | POA: Diagnosis not present

## 2015-05-12 DIAGNOSIS — D509 Iron deficiency anemia, unspecified: Secondary | ICD-10-CM | POA: Diagnosis not present

## 2015-05-12 DIAGNOSIS — D631 Anemia in chronic kidney disease: Secondary | ICD-10-CM | POA: Diagnosis not present

## 2015-05-15 DIAGNOSIS — E1122 Type 2 diabetes mellitus with diabetic chronic kidney disease: Secondary | ICD-10-CM | POA: Diagnosis not present

## 2015-05-15 DIAGNOSIS — E1142 Type 2 diabetes mellitus with diabetic polyneuropathy: Secondary | ICD-10-CM | POA: Diagnosis not present

## 2015-05-15 DIAGNOSIS — I120 Hypertensive chronic kidney disease with stage 5 chronic kidney disease or end stage renal disease: Secondary | ICD-10-CM | POA: Diagnosis not present

## 2015-05-15 DIAGNOSIS — D631 Anemia in chronic kidney disease: Secondary | ICD-10-CM | POA: Diagnosis not present

## 2015-05-15 DIAGNOSIS — N186 End stage renal disease: Secondary | ICD-10-CM | POA: Diagnosis not present

## 2015-05-15 DIAGNOSIS — D509 Iron deficiency anemia, unspecified: Secondary | ICD-10-CM | POA: Diagnosis not present

## 2015-05-15 DIAGNOSIS — E11610 Type 2 diabetes mellitus with diabetic neuropathic arthropathy: Secondary | ICD-10-CM | POA: Diagnosis not present

## 2015-05-15 DIAGNOSIS — N2581 Secondary hyperparathyroidism of renal origin: Secondary | ICD-10-CM | POA: Diagnosis not present

## 2015-05-15 DIAGNOSIS — E1129 Type 2 diabetes mellitus with other diabetic kidney complication: Secondary | ICD-10-CM | POA: Diagnosis not present

## 2015-05-15 DIAGNOSIS — T8781 Dehiscence of amputation stump: Secondary | ICD-10-CM | POA: Diagnosis not present

## 2015-05-15 DIAGNOSIS — Z794 Long term (current) use of insulin: Secondary | ICD-10-CM | POA: Diagnosis not present

## 2015-05-16 DIAGNOSIS — E11610 Type 2 diabetes mellitus with diabetic neuropathic arthropathy: Secondary | ICD-10-CM | POA: Diagnosis not present

## 2015-05-16 DIAGNOSIS — N186 End stage renal disease: Secondary | ICD-10-CM | POA: Diagnosis not present

## 2015-05-16 DIAGNOSIS — Z89512 Acquired absence of left leg below knee: Secondary | ICD-10-CM | POA: Diagnosis not present

## 2015-05-16 DIAGNOSIS — I120 Hypertensive chronic kidney disease with stage 5 chronic kidney disease or end stage renal disease: Secondary | ICD-10-CM | POA: Diagnosis not present

## 2015-05-16 DIAGNOSIS — Z794 Long term (current) use of insulin: Secondary | ICD-10-CM | POA: Diagnosis not present

## 2015-05-16 DIAGNOSIS — T8781 Dehiscence of amputation stump: Secondary | ICD-10-CM | POA: Diagnosis not present

## 2015-05-16 DIAGNOSIS — E1121 Type 2 diabetes mellitus with diabetic nephropathy: Secondary | ICD-10-CM | POA: Diagnosis not present

## 2015-05-16 DIAGNOSIS — E1122 Type 2 diabetes mellitus with diabetic chronic kidney disease: Secondary | ICD-10-CM | POA: Diagnosis not present

## 2015-05-16 DIAGNOSIS — Z23 Encounter for immunization: Secondary | ICD-10-CM | POA: Diagnosis not present

## 2015-05-16 DIAGNOSIS — E1142 Type 2 diabetes mellitus with diabetic polyneuropathy: Secondary | ICD-10-CM | POA: Diagnosis not present

## 2015-05-17 DIAGNOSIS — E1142 Type 2 diabetes mellitus with diabetic polyneuropathy: Secondary | ICD-10-CM | POA: Diagnosis not present

## 2015-05-17 DIAGNOSIS — D631 Anemia in chronic kidney disease: Secondary | ICD-10-CM | POA: Diagnosis not present

## 2015-05-17 DIAGNOSIS — E119 Type 2 diabetes mellitus without complications: Secondary | ICD-10-CM | POA: Diagnosis not present

## 2015-05-17 DIAGNOSIS — E11610 Type 2 diabetes mellitus with diabetic neuropathic arthropathy: Secondary | ICD-10-CM | POA: Diagnosis not present

## 2015-05-17 DIAGNOSIS — E209 Hypoparathyroidism, unspecified: Secondary | ICD-10-CM | POA: Diagnosis not present

## 2015-05-17 DIAGNOSIS — N186 End stage renal disease: Secondary | ICD-10-CM | POA: Diagnosis not present

## 2015-05-17 DIAGNOSIS — I120 Hypertensive chronic kidney disease with stage 5 chronic kidney disease or end stage renal disease: Secondary | ICD-10-CM | POA: Diagnosis not present

## 2015-05-17 DIAGNOSIS — T8781 Dehiscence of amputation stump: Secondary | ICD-10-CM | POA: Diagnosis not present

## 2015-05-17 DIAGNOSIS — E1122 Type 2 diabetes mellitus with diabetic chronic kidney disease: Secondary | ICD-10-CM | POA: Diagnosis not present

## 2015-05-17 DIAGNOSIS — D509 Iron deficiency anemia, unspecified: Secondary | ICD-10-CM | POA: Diagnosis not present

## 2015-05-17 DIAGNOSIS — D508 Other iron deficiency anemias: Secondary | ICD-10-CM | POA: Diagnosis not present

## 2015-05-18 DIAGNOSIS — E1122 Type 2 diabetes mellitus with diabetic chronic kidney disease: Secondary | ICD-10-CM | POA: Diagnosis not present

## 2015-05-18 DIAGNOSIS — I120 Hypertensive chronic kidney disease with stage 5 chronic kidney disease or end stage renal disease: Secondary | ICD-10-CM | POA: Diagnosis not present

## 2015-05-18 DIAGNOSIS — N186 End stage renal disease: Secondary | ICD-10-CM | POA: Diagnosis not present

## 2015-05-18 DIAGNOSIS — T8781 Dehiscence of amputation stump: Secondary | ICD-10-CM | POA: Diagnosis not present

## 2015-05-18 DIAGNOSIS — E1142 Type 2 diabetes mellitus with diabetic polyneuropathy: Secondary | ICD-10-CM | POA: Diagnosis not present

## 2015-05-18 DIAGNOSIS — E11610 Type 2 diabetes mellitus with diabetic neuropathic arthropathy: Secondary | ICD-10-CM | POA: Diagnosis not present

## 2015-05-22 DIAGNOSIS — Z992 Dependence on renal dialysis: Secondary | ICD-10-CM | POA: Diagnosis not present

## 2015-05-22 DIAGNOSIS — T82858D Stenosis of vascular prosthetic devices, implants and grafts, subsequent encounter: Secondary | ICD-10-CM | POA: Diagnosis not present

## 2015-05-22 DIAGNOSIS — I871 Compression of vein: Secondary | ICD-10-CM | POA: Diagnosis not present

## 2015-05-22 DIAGNOSIS — I120 Hypertensive chronic kidney disease with stage 5 chronic kidney disease or end stage renal disease: Secondary | ICD-10-CM | POA: Diagnosis not present

## 2015-05-22 DIAGNOSIS — E1122 Type 2 diabetes mellitus with diabetic chronic kidney disease: Secondary | ICD-10-CM | POA: Diagnosis not present

## 2015-05-22 DIAGNOSIS — N186 End stage renal disease: Secondary | ICD-10-CM | POA: Diagnosis not present

## 2015-05-22 DIAGNOSIS — T8781 Dehiscence of amputation stump: Secondary | ICD-10-CM | POA: Diagnosis not present

## 2015-05-22 DIAGNOSIS — E11610 Type 2 diabetes mellitus with diabetic neuropathic arthropathy: Secondary | ICD-10-CM | POA: Diagnosis not present

## 2015-05-22 DIAGNOSIS — E1142 Type 2 diabetes mellitus with diabetic polyneuropathy: Secondary | ICD-10-CM | POA: Diagnosis not present

## 2015-05-23 DIAGNOSIS — E11610 Type 2 diabetes mellitus with diabetic neuropathic arthropathy: Secondary | ICD-10-CM | POA: Diagnosis not present

## 2015-05-23 DIAGNOSIS — E1122 Type 2 diabetes mellitus with diabetic chronic kidney disease: Secondary | ICD-10-CM | POA: Diagnosis not present

## 2015-05-23 DIAGNOSIS — N186 End stage renal disease: Secondary | ICD-10-CM | POA: Diagnosis not present

## 2015-05-23 DIAGNOSIS — E1142 Type 2 diabetes mellitus with diabetic polyneuropathy: Secondary | ICD-10-CM | POA: Diagnosis not present

## 2015-05-23 DIAGNOSIS — I120 Hypertensive chronic kidney disease with stage 5 chronic kidney disease or end stage renal disease: Secondary | ICD-10-CM | POA: Diagnosis not present

## 2015-05-23 DIAGNOSIS — T8781 Dehiscence of amputation stump: Secondary | ICD-10-CM | POA: Diagnosis not present

## 2015-05-24 DIAGNOSIS — E11610 Type 2 diabetes mellitus with diabetic neuropathic arthropathy: Secondary | ICD-10-CM | POA: Diagnosis not present

## 2015-05-24 DIAGNOSIS — T8781 Dehiscence of amputation stump: Secondary | ICD-10-CM | POA: Diagnosis not present

## 2015-05-24 DIAGNOSIS — E1122 Type 2 diabetes mellitus with diabetic chronic kidney disease: Secondary | ICD-10-CM | POA: Diagnosis not present

## 2015-05-24 DIAGNOSIS — N186 End stage renal disease: Secondary | ICD-10-CM | POA: Diagnosis not present

## 2015-05-24 DIAGNOSIS — I120 Hypertensive chronic kidney disease with stage 5 chronic kidney disease or end stage renal disease: Secondary | ICD-10-CM | POA: Diagnosis not present

## 2015-05-24 DIAGNOSIS — E1142 Type 2 diabetes mellitus with diabetic polyneuropathy: Secondary | ICD-10-CM | POA: Diagnosis not present

## 2015-05-26 DIAGNOSIS — T8781 Dehiscence of amputation stump: Secondary | ICD-10-CM | POA: Diagnosis not present

## 2015-05-26 DIAGNOSIS — I120 Hypertensive chronic kidney disease with stage 5 chronic kidney disease or end stage renal disease: Secondary | ICD-10-CM | POA: Diagnosis not present

## 2015-05-26 DIAGNOSIS — E11610 Type 2 diabetes mellitus with diabetic neuropathic arthropathy: Secondary | ICD-10-CM | POA: Diagnosis not present

## 2015-05-26 DIAGNOSIS — E1122 Type 2 diabetes mellitus with diabetic chronic kidney disease: Secondary | ICD-10-CM | POA: Diagnosis not present

## 2015-05-26 DIAGNOSIS — E1142 Type 2 diabetes mellitus with diabetic polyneuropathy: Secondary | ICD-10-CM | POA: Diagnosis not present

## 2015-05-26 DIAGNOSIS — N186 End stage renal disease: Secondary | ICD-10-CM | POA: Diagnosis not present

## 2015-05-29 DIAGNOSIS — N186 End stage renal disease: Secondary | ICD-10-CM | POA: Diagnosis not present

## 2015-05-29 DIAGNOSIS — T8781 Dehiscence of amputation stump: Secondary | ICD-10-CM | POA: Diagnosis not present

## 2015-05-29 DIAGNOSIS — E1122 Type 2 diabetes mellitus with diabetic chronic kidney disease: Secondary | ICD-10-CM | POA: Diagnosis not present

## 2015-05-29 DIAGNOSIS — E11610 Type 2 diabetes mellitus with diabetic neuropathic arthropathy: Secondary | ICD-10-CM | POA: Diagnosis not present

## 2015-05-29 DIAGNOSIS — E1142 Type 2 diabetes mellitus with diabetic polyneuropathy: Secondary | ICD-10-CM | POA: Diagnosis not present

## 2015-05-29 DIAGNOSIS — I120 Hypertensive chronic kidney disease with stage 5 chronic kidney disease or end stage renal disease: Secondary | ICD-10-CM | POA: Diagnosis not present

## 2015-05-31 DIAGNOSIS — T8781 Dehiscence of amputation stump: Secondary | ICD-10-CM | POA: Diagnosis not present

## 2015-05-31 DIAGNOSIS — I120 Hypertensive chronic kidney disease with stage 5 chronic kidney disease or end stage renal disease: Secondary | ICD-10-CM | POA: Diagnosis not present

## 2015-05-31 DIAGNOSIS — E11610 Type 2 diabetes mellitus with diabetic neuropathic arthropathy: Secondary | ICD-10-CM | POA: Diagnosis not present

## 2015-05-31 DIAGNOSIS — E1142 Type 2 diabetes mellitus with diabetic polyneuropathy: Secondary | ICD-10-CM | POA: Diagnosis not present

## 2015-05-31 DIAGNOSIS — E1122 Type 2 diabetes mellitus with diabetic chronic kidney disease: Secondary | ICD-10-CM | POA: Diagnosis not present

## 2015-05-31 DIAGNOSIS — N186 End stage renal disease: Secondary | ICD-10-CM | POA: Diagnosis not present

## 2015-06-05 DIAGNOSIS — I120 Hypertensive chronic kidney disease with stage 5 chronic kidney disease or end stage renal disease: Secondary | ICD-10-CM | POA: Diagnosis not present

## 2015-06-05 DIAGNOSIS — E1142 Type 2 diabetes mellitus with diabetic polyneuropathy: Secondary | ICD-10-CM | POA: Diagnosis not present

## 2015-06-05 DIAGNOSIS — E1122 Type 2 diabetes mellitus with diabetic chronic kidney disease: Secondary | ICD-10-CM | POA: Diagnosis not present

## 2015-06-05 DIAGNOSIS — N186 End stage renal disease: Secondary | ICD-10-CM | POA: Diagnosis not present

## 2015-06-05 DIAGNOSIS — E11610 Type 2 diabetes mellitus with diabetic neuropathic arthropathy: Secondary | ICD-10-CM | POA: Diagnosis not present

## 2015-06-05 DIAGNOSIS — T8781 Dehiscence of amputation stump: Secondary | ICD-10-CM | POA: Diagnosis not present

## 2015-06-07 DIAGNOSIS — T8781 Dehiscence of amputation stump: Secondary | ICD-10-CM | POA: Diagnosis not present

## 2015-06-07 DIAGNOSIS — I120 Hypertensive chronic kidney disease with stage 5 chronic kidney disease or end stage renal disease: Secondary | ICD-10-CM | POA: Diagnosis not present

## 2015-06-07 DIAGNOSIS — E1122 Type 2 diabetes mellitus with diabetic chronic kidney disease: Secondary | ICD-10-CM | POA: Diagnosis not present

## 2015-06-07 DIAGNOSIS — E11610 Type 2 diabetes mellitus with diabetic neuropathic arthropathy: Secondary | ICD-10-CM | POA: Diagnosis not present

## 2015-06-07 DIAGNOSIS — N186 End stage renal disease: Secondary | ICD-10-CM | POA: Diagnosis not present

## 2015-06-07 DIAGNOSIS — E1142 Type 2 diabetes mellitus with diabetic polyneuropathy: Secondary | ICD-10-CM | POA: Diagnosis not present

## 2015-06-14 DIAGNOSIS — Z794 Long term (current) use of insulin: Secondary | ICD-10-CM | POA: Diagnosis not present

## 2015-06-14 DIAGNOSIS — N186 End stage renal disease: Secondary | ICD-10-CM | POA: Diagnosis not present

## 2015-06-14 DIAGNOSIS — I120 Hypertensive chronic kidney disease with stage 5 chronic kidney disease or end stage renal disease: Secondary | ICD-10-CM | POA: Diagnosis not present

## 2015-06-14 DIAGNOSIS — E1129 Type 2 diabetes mellitus with other diabetic kidney complication: Secondary | ICD-10-CM | POA: Diagnosis not present

## 2015-06-14 DIAGNOSIS — E1142 Type 2 diabetes mellitus with diabetic polyneuropathy: Secondary | ICD-10-CM | POA: Diagnosis not present

## 2015-06-14 DIAGNOSIS — E11610 Type 2 diabetes mellitus with diabetic neuropathic arthropathy: Secondary | ICD-10-CM | POA: Diagnosis not present

## 2015-06-14 DIAGNOSIS — T8781 Dehiscence of amputation stump: Secondary | ICD-10-CM | POA: Diagnosis not present

## 2015-06-14 DIAGNOSIS — E1122 Type 2 diabetes mellitus with diabetic chronic kidney disease: Secondary | ICD-10-CM | POA: Diagnosis not present

## 2015-06-15 DIAGNOSIS — N186 End stage renal disease: Secondary | ICD-10-CM | POA: Diagnosis not present

## 2015-06-15 DIAGNOSIS — E11610 Type 2 diabetes mellitus with diabetic neuropathic arthropathy: Secondary | ICD-10-CM | POA: Diagnosis not present

## 2015-06-15 DIAGNOSIS — T8781 Dehiscence of amputation stump: Secondary | ICD-10-CM | POA: Diagnosis not present

## 2015-06-15 DIAGNOSIS — I120 Hypertensive chronic kidney disease with stage 5 chronic kidney disease or end stage renal disease: Secondary | ICD-10-CM | POA: Diagnosis not present

## 2015-06-15 DIAGNOSIS — E1122 Type 2 diabetes mellitus with diabetic chronic kidney disease: Secondary | ICD-10-CM | POA: Diagnosis not present

## 2015-06-15 DIAGNOSIS — E1142 Type 2 diabetes mellitus with diabetic polyneuropathy: Secondary | ICD-10-CM | POA: Diagnosis not present

## 2015-06-16 DIAGNOSIS — E209 Hypoparathyroidism, unspecified: Secondary | ICD-10-CM | POA: Diagnosis not present

## 2015-06-16 DIAGNOSIS — D631 Anemia in chronic kidney disease: Secondary | ICD-10-CM | POA: Diagnosis not present

## 2015-06-16 DIAGNOSIS — D508 Other iron deficiency anemias: Secondary | ICD-10-CM | POA: Diagnosis not present

## 2015-06-16 DIAGNOSIS — E119 Type 2 diabetes mellitus without complications: Secondary | ICD-10-CM | POA: Diagnosis not present

## 2015-06-16 DIAGNOSIS — D509 Iron deficiency anemia, unspecified: Secondary | ICD-10-CM | POA: Diagnosis not present

## 2015-06-16 DIAGNOSIS — N186 End stage renal disease: Secondary | ICD-10-CM | POA: Diagnosis not present

## 2015-06-19 DIAGNOSIS — E11610 Type 2 diabetes mellitus with diabetic neuropathic arthropathy: Secondary | ICD-10-CM | POA: Diagnosis not present

## 2015-06-19 DIAGNOSIS — E1122 Type 2 diabetes mellitus with diabetic chronic kidney disease: Secondary | ICD-10-CM | POA: Diagnosis not present

## 2015-06-19 DIAGNOSIS — D508 Other iron deficiency anemias: Secondary | ICD-10-CM | POA: Diagnosis not present

## 2015-06-19 DIAGNOSIS — D509 Iron deficiency anemia, unspecified: Secondary | ICD-10-CM | POA: Diagnosis not present

## 2015-06-19 DIAGNOSIS — D631 Anemia in chronic kidney disease: Secondary | ICD-10-CM | POA: Diagnosis not present

## 2015-06-19 DIAGNOSIS — N186 End stage renal disease: Secondary | ICD-10-CM | POA: Diagnosis not present

## 2015-06-19 DIAGNOSIS — E119 Type 2 diabetes mellitus without complications: Secondary | ICD-10-CM | POA: Diagnosis not present

## 2015-06-19 DIAGNOSIS — T8781 Dehiscence of amputation stump: Secondary | ICD-10-CM | POA: Diagnosis not present

## 2015-06-19 DIAGNOSIS — E209 Hypoparathyroidism, unspecified: Secondary | ICD-10-CM | POA: Diagnosis not present

## 2015-06-19 DIAGNOSIS — E1142 Type 2 diabetes mellitus with diabetic polyneuropathy: Secondary | ICD-10-CM | POA: Diagnosis not present

## 2015-06-19 DIAGNOSIS — I120 Hypertensive chronic kidney disease with stage 5 chronic kidney disease or end stage renal disease: Secondary | ICD-10-CM | POA: Diagnosis not present

## 2015-06-21 DIAGNOSIS — D631 Anemia in chronic kidney disease: Secondary | ICD-10-CM | POA: Diagnosis not present

## 2015-06-21 DIAGNOSIS — E119 Type 2 diabetes mellitus without complications: Secondary | ICD-10-CM | POA: Diagnosis not present

## 2015-06-21 DIAGNOSIS — D509 Iron deficiency anemia, unspecified: Secondary | ICD-10-CM | POA: Diagnosis not present

## 2015-06-21 DIAGNOSIS — D508 Other iron deficiency anemias: Secondary | ICD-10-CM | POA: Diagnosis not present

## 2015-06-21 DIAGNOSIS — E209 Hypoparathyroidism, unspecified: Secondary | ICD-10-CM | POA: Diagnosis not present

## 2015-06-21 DIAGNOSIS — N186 End stage renal disease: Secondary | ICD-10-CM | POA: Diagnosis not present

## 2015-06-23 DIAGNOSIS — D509 Iron deficiency anemia, unspecified: Secondary | ICD-10-CM | POA: Diagnosis not present

## 2015-06-23 DIAGNOSIS — N186 End stage renal disease: Secondary | ICD-10-CM | POA: Diagnosis not present

## 2015-06-23 DIAGNOSIS — D631 Anemia in chronic kidney disease: Secondary | ICD-10-CM | POA: Diagnosis not present

## 2015-06-23 DIAGNOSIS — E11610 Type 2 diabetes mellitus with diabetic neuropathic arthropathy: Secondary | ICD-10-CM | POA: Diagnosis not present

## 2015-06-23 DIAGNOSIS — D508 Other iron deficiency anemias: Secondary | ICD-10-CM | POA: Diagnosis not present

## 2015-06-23 DIAGNOSIS — E1122 Type 2 diabetes mellitus with diabetic chronic kidney disease: Secondary | ICD-10-CM | POA: Diagnosis not present

## 2015-06-23 DIAGNOSIS — T8781 Dehiscence of amputation stump: Secondary | ICD-10-CM | POA: Diagnosis not present

## 2015-06-23 DIAGNOSIS — E209 Hypoparathyroidism, unspecified: Secondary | ICD-10-CM | POA: Diagnosis not present

## 2015-06-23 DIAGNOSIS — I120 Hypertensive chronic kidney disease with stage 5 chronic kidney disease or end stage renal disease: Secondary | ICD-10-CM | POA: Diagnosis not present

## 2015-06-23 DIAGNOSIS — E1142 Type 2 diabetes mellitus with diabetic polyneuropathy: Secondary | ICD-10-CM | POA: Diagnosis not present

## 2015-06-23 DIAGNOSIS — E119 Type 2 diabetes mellitus without complications: Secondary | ICD-10-CM | POA: Diagnosis not present

## 2015-06-26 DIAGNOSIS — E209 Hypoparathyroidism, unspecified: Secondary | ICD-10-CM | POA: Diagnosis not present

## 2015-06-26 DIAGNOSIS — D631 Anemia in chronic kidney disease: Secondary | ICD-10-CM | POA: Diagnosis not present

## 2015-06-26 DIAGNOSIS — N186 End stage renal disease: Secondary | ICD-10-CM | POA: Diagnosis not present

## 2015-06-26 DIAGNOSIS — E119 Type 2 diabetes mellitus without complications: Secondary | ICD-10-CM | POA: Diagnosis not present

## 2015-06-26 DIAGNOSIS — D509 Iron deficiency anemia, unspecified: Secondary | ICD-10-CM | POA: Diagnosis not present

## 2015-06-26 DIAGNOSIS — D508 Other iron deficiency anemias: Secondary | ICD-10-CM | POA: Diagnosis not present

## 2015-06-27 DIAGNOSIS — T8781 Dehiscence of amputation stump: Secondary | ICD-10-CM | POA: Diagnosis not present

## 2015-06-27 DIAGNOSIS — E1142 Type 2 diabetes mellitus with diabetic polyneuropathy: Secondary | ICD-10-CM | POA: Diagnosis not present

## 2015-06-27 DIAGNOSIS — E11610 Type 2 diabetes mellitus with diabetic neuropathic arthropathy: Secondary | ICD-10-CM | POA: Diagnosis not present

## 2015-06-27 DIAGNOSIS — N186 End stage renal disease: Secondary | ICD-10-CM | POA: Diagnosis not present

## 2015-06-27 DIAGNOSIS — E1122 Type 2 diabetes mellitus with diabetic chronic kidney disease: Secondary | ICD-10-CM | POA: Diagnosis not present

## 2015-06-27 DIAGNOSIS — I120 Hypertensive chronic kidney disease with stage 5 chronic kidney disease or end stage renal disease: Secondary | ICD-10-CM | POA: Diagnosis not present

## 2015-06-28 DIAGNOSIS — E209 Hypoparathyroidism, unspecified: Secondary | ICD-10-CM | POA: Diagnosis not present

## 2015-06-28 DIAGNOSIS — D508 Other iron deficiency anemias: Secondary | ICD-10-CM | POA: Diagnosis not present

## 2015-06-28 DIAGNOSIS — N186 End stage renal disease: Secondary | ICD-10-CM | POA: Diagnosis not present

## 2015-06-28 DIAGNOSIS — D509 Iron deficiency anemia, unspecified: Secondary | ICD-10-CM | POA: Diagnosis not present

## 2015-06-28 DIAGNOSIS — D631 Anemia in chronic kidney disease: Secondary | ICD-10-CM | POA: Diagnosis not present

## 2015-06-28 DIAGNOSIS — E119 Type 2 diabetes mellitus without complications: Secondary | ICD-10-CM | POA: Diagnosis not present

## 2015-06-29 DIAGNOSIS — T8789 Other complications of amputation stump: Secondary | ICD-10-CM | POA: Diagnosis not present

## 2015-06-30 DIAGNOSIS — E119 Type 2 diabetes mellitus without complications: Secondary | ICD-10-CM | POA: Diagnosis not present

## 2015-06-30 DIAGNOSIS — D509 Iron deficiency anemia, unspecified: Secondary | ICD-10-CM | POA: Diagnosis not present

## 2015-06-30 DIAGNOSIS — N186 End stage renal disease: Secondary | ICD-10-CM | POA: Diagnosis not present

## 2015-06-30 DIAGNOSIS — E209 Hypoparathyroidism, unspecified: Secondary | ICD-10-CM | POA: Diagnosis not present

## 2015-06-30 DIAGNOSIS — D631 Anemia in chronic kidney disease: Secondary | ICD-10-CM | POA: Diagnosis not present

## 2015-06-30 DIAGNOSIS — D508 Other iron deficiency anemias: Secondary | ICD-10-CM | POA: Diagnosis not present

## 2015-07-03 DIAGNOSIS — E209 Hypoparathyroidism, unspecified: Secondary | ICD-10-CM | POA: Diagnosis not present

## 2015-07-03 DIAGNOSIS — D631 Anemia in chronic kidney disease: Secondary | ICD-10-CM | POA: Diagnosis not present

## 2015-07-03 DIAGNOSIS — D508 Other iron deficiency anemias: Secondary | ICD-10-CM | POA: Diagnosis not present

## 2015-07-03 DIAGNOSIS — N186 End stage renal disease: Secondary | ICD-10-CM | POA: Diagnosis not present

## 2015-07-03 DIAGNOSIS — E119 Type 2 diabetes mellitus without complications: Secondary | ICD-10-CM | POA: Diagnosis not present

## 2015-07-03 DIAGNOSIS — D509 Iron deficiency anemia, unspecified: Secondary | ICD-10-CM | POA: Diagnosis not present

## 2015-07-05 DIAGNOSIS — E119 Type 2 diabetes mellitus without complications: Secondary | ICD-10-CM | POA: Diagnosis not present

## 2015-07-05 DIAGNOSIS — D631 Anemia in chronic kidney disease: Secondary | ICD-10-CM | POA: Diagnosis not present

## 2015-07-05 DIAGNOSIS — D508 Other iron deficiency anemias: Secondary | ICD-10-CM | POA: Diagnosis not present

## 2015-07-05 DIAGNOSIS — E209 Hypoparathyroidism, unspecified: Secondary | ICD-10-CM | POA: Diagnosis not present

## 2015-07-05 DIAGNOSIS — D509 Iron deficiency anemia, unspecified: Secondary | ICD-10-CM | POA: Diagnosis not present

## 2015-07-05 DIAGNOSIS — N186 End stage renal disease: Secondary | ICD-10-CM | POA: Diagnosis not present

## 2015-07-07 DIAGNOSIS — E119 Type 2 diabetes mellitus without complications: Secondary | ICD-10-CM | POA: Diagnosis not present

## 2015-07-07 DIAGNOSIS — E209 Hypoparathyroidism, unspecified: Secondary | ICD-10-CM | POA: Diagnosis not present

## 2015-07-07 DIAGNOSIS — N186 End stage renal disease: Secondary | ICD-10-CM | POA: Diagnosis not present

## 2015-07-07 DIAGNOSIS — D631 Anemia in chronic kidney disease: Secondary | ICD-10-CM | POA: Diagnosis not present

## 2015-07-07 DIAGNOSIS — D508 Other iron deficiency anemias: Secondary | ICD-10-CM | POA: Diagnosis not present

## 2015-07-07 DIAGNOSIS — D509 Iron deficiency anemia, unspecified: Secondary | ICD-10-CM | POA: Diagnosis not present

## 2015-07-10 DIAGNOSIS — D509 Iron deficiency anemia, unspecified: Secondary | ICD-10-CM | POA: Diagnosis not present

## 2015-07-10 DIAGNOSIS — D508 Other iron deficiency anemias: Secondary | ICD-10-CM | POA: Diagnosis not present

## 2015-07-10 DIAGNOSIS — N186 End stage renal disease: Secondary | ICD-10-CM | POA: Diagnosis not present

## 2015-07-10 DIAGNOSIS — E119 Type 2 diabetes mellitus without complications: Secondary | ICD-10-CM | POA: Diagnosis not present

## 2015-07-10 DIAGNOSIS — D631 Anemia in chronic kidney disease: Secondary | ICD-10-CM | POA: Diagnosis not present

## 2015-07-10 DIAGNOSIS — E209 Hypoparathyroidism, unspecified: Secondary | ICD-10-CM | POA: Diagnosis not present

## 2015-07-12 DIAGNOSIS — N186 End stage renal disease: Secondary | ICD-10-CM | POA: Diagnosis not present

## 2015-07-12 DIAGNOSIS — D509 Iron deficiency anemia, unspecified: Secondary | ICD-10-CM | POA: Diagnosis not present

## 2015-07-12 DIAGNOSIS — E209 Hypoparathyroidism, unspecified: Secondary | ICD-10-CM | POA: Diagnosis not present

## 2015-07-12 DIAGNOSIS — D631 Anemia in chronic kidney disease: Secondary | ICD-10-CM | POA: Diagnosis not present

## 2015-07-12 DIAGNOSIS — D508 Other iron deficiency anemias: Secondary | ICD-10-CM | POA: Diagnosis not present

## 2015-07-12 DIAGNOSIS — E119 Type 2 diabetes mellitus without complications: Secondary | ICD-10-CM | POA: Diagnosis not present

## 2015-07-14 DIAGNOSIS — N186 End stage renal disease: Secondary | ICD-10-CM | POA: Diagnosis not present

## 2015-07-14 DIAGNOSIS — D509 Iron deficiency anemia, unspecified: Secondary | ICD-10-CM | POA: Diagnosis not present

## 2015-07-14 DIAGNOSIS — E119 Type 2 diabetes mellitus without complications: Secondary | ICD-10-CM | POA: Diagnosis not present

## 2015-07-14 DIAGNOSIS — E209 Hypoparathyroidism, unspecified: Secondary | ICD-10-CM | POA: Diagnosis not present

## 2015-07-14 DIAGNOSIS — D631 Anemia in chronic kidney disease: Secondary | ICD-10-CM | POA: Diagnosis not present

## 2015-07-14 DIAGNOSIS — D508 Other iron deficiency anemias: Secondary | ICD-10-CM | POA: Diagnosis not present

## 2015-07-15 DIAGNOSIS — Z992 Dependence on renal dialysis: Secondary | ICD-10-CM | POA: Diagnosis not present

## 2015-07-15 DIAGNOSIS — N186 End stage renal disease: Secondary | ICD-10-CM | POA: Diagnosis not present

## 2015-07-15 DIAGNOSIS — E1129 Type 2 diabetes mellitus with other diabetic kidney complication: Secondary | ICD-10-CM | POA: Diagnosis not present

## 2015-07-17 DIAGNOSIS — E209 Hypoparathyroidism, unspecified: Secondary | ICD-10-CM | POA: Diagnosis not present

## 2015-07-17 DIAGNOSIS — D508 Other iron deficiency anemias: Secondary | ICD-10-CM | POA: Diagnosis not present

## 2015-07-17 DIAGNOSIS — E119 Type 2 diabetes mellitus without complications: Secondary | ICD-10-CM | POA: Diagnosis not present

## 2015-07-17 DIAGNOSIS — D631 Anemia in chronic kidney disease: Secondary | ICD-10-CM | POA: Diagnosis not present

## 2015-07-17 DIAGNOSIS — N186 End stage renal disease: Secondary | ICD-10-CM | POA: Diagnosis not present

## 2015-07-17 DIAGNOSIS — D509 Iron deficiency anemia, unspecified: Secondary | ICD-10-CM | POA: Diagnosis not present

## 2015-07-19 DIAGNOSIS — E119 Type 2 diabetes mellitus without complications: Secondary | ICD-10-CM | POA: Diagnosis not present

## 2015-07-19 DIAGNOSIS — E209 Hypoparathyroidism, unspecified: Secondary | ICD-10-CM | POA: Diagnosis not present

## 2015-07-19 DIAGNOSIS — N186 End stage renal disease: Secondary | ICD-10-CM | POA: Diagnosis not present

## 2015-07-19 DIAGNOSIS — D509 Iron deficiency anemia, unspecified: Secondary | ICD-10-CM | POA: Diagnosis not present

## 2015-07-19 DIAGNOSIS — D631 Anemia in chronic kidney disease: Secondary | ICD-10-CM | POA: Diagnosis not present

## 2015-07-19 DIAGNOSIS — D508 Other iron deficiency anemias: Secondary | ICD-10-CM | POA: Diagnosis not present

## 2015-07-21 DIAGNOSIS — D509 Iron deficiency anemia, unspecified: Secondary | ICD-10-CM | POA: Diagnosis not present

## 2015-07-21 DIAGNOSIS — N186 End stage renal disease: Secondary | ICD-10-CM | POA: Diagnosis not present

## 2015-07-21 DIAGNOSIS — E119 Type 2 diabetes mellitus without complications: Secondary | ICD-10-CM | POA: Diagnosis not present

## 2015-07-21 DIAGNOSIS — D508 Other iron deficiency anemias: Secondary | ICD-10-CM | POA: Diagnosis not present

## 2015-07-21 DIAGNOSIS — D631 Anemia in chronic kidney disease: Secondary | ICD-10-CM | POA: Diagnosis not present

## 2015-07-21 DIAGNOSIS — E209 Hypoparathyroidism, unspecified: Secondary | ICD-10-CM | POA: Diagnosis not present

## 2015-07-24 DIAGNOSIS — D508 Other iron deficiency anemias: Secondary | ICD-10-CM | POA: Diagnosis not present

## 2015-07-24 DIAGNOSIS — D509 Iron deficiency anemia, unspecified: Secondary | ICD-10-CM | POA: Diagnosis not present

## 2015-07-24 DIAGNOSIS — N186 End stage renal disease: Secondary | ICD-10-CM | POA: Diagnosis not present

## 2015-07-24 DIAGNOSIS — E209 Hypoparathyroidism, unspecified: Secondary | ICD-10-CM | POA: Diagnosis not present

## 2015-07-24 DIAGNOSIS — D631 Anemia in chronic kidney disease: Secondary | ICD-10-CM | POA: Diagnosis not present

## 2015-07-24 DIAGNOSIS — E119 Type 2 diabetes mellitus without complications: Secondary | ICD-10-CM | POA: Diagnosis not present

## 2015-07-25 DIAGNOSIS — D508 Other iron deficiency anemias: Secondary | ICD-10-CM | POA: Diagnosis not present

## 2015-07-25 DIAGNOSIS — N186 End stage renal disease: Secondary | ICD-10-CM | POA: Diagnosis not present

## 2015-07-25 DIAGNOSIS — Z992 Dependence on renal dialysis: Secondary | ICD-10-CM | POA: Diagnosis not present

## 2015-07-25 DIAGNOSIS — D631 Anemia in chronic kidney disease: Secondary | ICD-10-CM | POA: Diagnosis not present

## 2015-07-25 DIAGNOSIS — T82858D Stenosis of vascular prosthetic devices, implants and grafts, subsequent encounter: Secondary | ICD-10-CM | POA: Diagnosis not present

## 2015-07-25 DIAGNOSIS — I871 Compression of vein: Secondary | ICD-10-CM | POA: Diagnosis not present

## 2015-07-25 DIAGNOSIS — D509 Iron deficiency anemia, unspecified: Secondary | ICD-10-CM | POA: Diagnosis not present

## 2015-07-25 DIAGNOSIS — E119 Type 2 diabetes mellitus without complications: Secondary | ICD-10-CM | POA: Diagnosis not present

## 2015-07-25 DIAGNOSIS — E209 Hypoparathyroidism, unspecified: Secondary | ICD-10-CM | POA: Diagnosis not present

## 2015-07-26 DIAGNOSIS — N186 End stage renal disease: Secondary | ICD-10-CM | POA: Diagnosis not present

## 2015-07-26 DIAGNOSIS — D631 Anemia in chronic kidney disease: Secondary | ICD-10-CM | POA: Diagnosis not present

## 2015-07-26 DIAGNOSIS — D508 Other iron deficiency anemias: Secondary | ICD-10-CM | POA: Diagnosis not present

## 2015-07-26 DIAGNOSIS — D509 Iron deficiency anemia, unspecified: Secondary | ICD-10-CM | POA: Diagnosis not present

## 2015-07-26 DIAGNOSIS — E119 Type 2 diabetes mellitus without complications: Secondary | ICD-10-CM | POA: Diagnosis not present

## 2015-07-26 DIAGNOSIS — E209 Hypoparathyroidism, unspecified: Secondary | ICD-10-CM | POA: Diagnosis not present

## 2015-07-28 DIAGNOSIS — D508 Other iron deficiency anemias: Secondary | ICD-10-CM | POA: Diagnosis not present

## 2015-07-28 DIAGNOSIS — D631 Anemia in chronic kidney disease: Secondary | ICD-10-CM | POA: Diagnosis not present

## 2015-07-28 DIAGNOSIS — E119 Type 2 diabetes mellitus without complications: Secondary | ICD-10-CM | POA: Diagnosis not present

## 2015-07-28 DIAGNOSIS — D509 Iron deficiency anemia, unspecified: Secondary | ICD-10-CM | POA: Diagnosis not present

## 2015-07-28 DIAGNOSIS — E209 Hypoparathyroidism, unspecified: Secondary | ICD-10-CM | POA: Diagnosis not present

## 2015-07-28 DIAGNOSIS — N186 End stage renal disease: Secondary | ICD-10-CM | POA: Diagnosis not present

## 2015-07-31 DIAGNOSIS — E119 Type 2 diabetes mellitus without complications: Secondary | ICD-10-CM | POA: Diagnosis not present

## 2015-07-31 DIAGNOSIS — D509 Iron deficiency anemia, unspecified: Secondary | ICD-10-CM | POA: Diagnosis not present

## 2015-07-31 DIAGNOSIS — D508 Other iron deficiency anemias: Secondary | ICD-10-CM | POA: Diagnosis not present

## 2015-07-31 DIAGNOSIS — N186 End stage renal disease: Secondary | ICD-10-CM | POA: Diagnosis not present

## 2015-07-31 DIAGNOSIS — E209 Hypoparathyroidism, unspecified: Secondary | ICD-10-CM | POA: Diagnosis not present

## 2015-07-31 DIAGNOSIS — D631 Anemia in chronic kidney disease: Secondary | ICD-10-CM | POA: Diagnosis not present

## 2015-08-02 DIAGNOSIS — D631 Anemia in chronic kidney disease: Secondary | ICD-10-CM | POA: Diagnosis not present

## 2015-08-02 DIAGNOSIS — D508 Other iron deficiency anemias: Secondary | ICD-10-CM | POA: Diagnosis not present

## 2015-08-02 DIAGNOSIS — N186 End stage renal disease: Secondary | ICD-10-CM | POA: Diagnosis not present

## 2015-08-02 DIAGNOSIS — E119 Type 2 diabetes mellitus without complications: Secondary | ICD-10-CM | POA: Diagnosis not present

## 2015-08-02 DIAGNOSIS — E209 Hypoparathyroidism, unspecified: Secondary | ICD-10-CM | POA: Diagnosis not present

## 2015-08-02 DIAGNOSIS — D509 Iron deficiency anemia, unspecified: Secondary | ICD-10-CM | POA: Diagnosis not present

## 2015-08-04 DIAGNOSIS — E209 Hypoparathyroidism, unspecified: Secondary | ICD-10-CM | POA: Diagnosis not present

## 2015-08-04 DIAGNOSIS — D509 Iron deficiency anemia, unspecified: Secondary | ICD-10-CM | POA: Diagnosis not present

## 2015-08-04 DIAGNOSIS — D631 Anemia in chronic kidney disease: Secondary | ICD-10-CM | POA: Diagnosis not present

## 2015-08-04 DIAGNOSIS — D508 Other iron deficiency anemias: Secondary | ICD-10-CM | POA: Diagnosis not present

## 2015-08-04 DIAGNOSIS — E119 Type 2 diabetes mellitus without complications: Secondary | ICD-10-CM | POA: Diagnosis not present

## 2015-08-04 DIAGNOSIS — N186 End stage renal disease: Secondary | ICD-10-CM | POA: Diagnosis not present

## 2015-08-07 DIAGNOSIS — E119 Type 2 diabetes mellitus without complications: Secondary | ICD-10-CM | POA: Diagnosis not present

## 2015-08-07 DIAGNOSIS — N186 End stage renal disease: Secondary | ICD-10-CM | POA: Diagnosis not present

## 2015-08-07 DIAGNOSIS — D508 Other iron deficiency anemias: Secondary | ICD-10-CM | POA: Diagnosis not present

## 2015-08-07 DIAGNOSIS — D631 Anemia in chronic kidney disease: Secondary | ICD-10-CM | POA: Diagnosis not present

## 2015-08-07 DIAGNOSIS — D509 Iron deficiency anemia, unspecified: Secondary | ICD-10-CM | POA: Diagnosis not present

## 2015-08-07 DIAGNOSIS — E209 Hypoparathyroidism, unspecified: Secondary | ICD-10-CM | POA: Diagnosis not present

## 2015-08-09 DIAGNOSIS — D508 Other iron deficiency anemias: Secondary | ICD-10-CM | POA: Diagnosis not present

## 2015-08-09 DIAGNOSIS — E1129 Type 2 diabetes mellitus with other diabetic kidney complication: Secondary | ICD-10-CM | POA: Diagnosis not present

## 2015-08-09 DIAGNOSIS — D509 Iron deficiency anemia, unspecified: Secondary | ICD-10-CM | POA: Diagnosis not present

## 2015-08-09 DIAGNOSIS — E119 Type 2 diabetes mellitus without complications: Secondary | ICD-10-CM | POA: Diagnosis not present

## 2015-08-09 DIAGNOSIS — E209 Hypoparathyroidism, unspecified: Secondary | ICD-10-CM | POA: Diagnosis not present

## 2015-08-09 DIAGNOSIS — D631 Anemia in chronic kidney disease: Secondary | ICD-10-CM | POA: Diagnosis not present

## 2015-08-09 DIAGNOSIS — N186 End stage renal disease: Secondary | ICD-10-CM | POA: Diagnosis not present

## 2015-08-11 ENCOUNTER — Inpatient Hospital Stay (HOSPITAL_COMMUNITY): Payer: Medicare Other

## 2015-08-11 ENCOUNTER — Encounter (HOSPITAL_COMMUNITY): Payer: Self-pay

## 2015-08-11 ENCOUNTER — Inpatient Hospital Stay (HOSPITAL_COMMUNITY)
Admission: EM | Admit: 2015-08-11 | Discharge: 2015-08-19 | DRG: 252 | Disposition: A | Discharge status: Home / Self Care | Payer: Medicare Other | Attending: Internal Medicine | Admitting: Internal Medicine

## 2015-08-11 ENCOUNTER — Emergency Department (HOSPITAL_COMMUNITY): Payer: Medicare Other

## 2015-08-11 DIAGNOSIS — Z87891 Personal history of nicotine dependence: Secondary | ICD-10-CM | POA: Diagnosis not present

## 2015-08-11 DIAGNOSIS — R197 Diarrhea, unspecified: Secondary | ICD-10-CM | POA: Diagnosis not present

## 2015-08-11 DIAGNOSIS — D696 Thrombocytopenia, unspecified: Secondary | ICD-10-CM | POA: Diagnosis present

## 2015-08-11 DIAGNOSIS — A419 Sepsis, unspecified organism: Secondary | ICD-10-CM | POA: Diagnosis present

## 2015-08-11 DIAGNOSIS — D631 Anemia in chronic kidney disease: Secondary | ICD-10-CM | POA: Diagnosis not present

## 2015-08-11 DIAGNOSIS — R7881 Bacteremia: Secondary | ICD-10-CM | POA: Diagnosis not present

## 2015-08-11 DIAGNOSIS — Z419 Encounter for procedure for purposes other than remedying health state, unspecified: Secondary | ICD-10-CM

## 2015-08-11 DIAGNOSIS — I361 Nonrheumatic tricuspid (valve) insufficiency: Secondary | ICD-10-CM | POA: Diagnosis present

## 2015-08-11 DIAGNOSIS — R509 Fever, unspecified: Secondary | ICD-10-CM | POA: Diagnosis not present

## 2015-08-11 DIAGNOSIS — Z8249 Family history of ischemic heart disease and other diseases of the circulatory system: Secondary | ICD-10-CM | POA: Diagnosis not present

## 2015-08-11 DIAGNOSIS — I132 Hypertensive heart and chronic kidney disease with heart failure and with stage 5 chronic kidney disease, or end stage renal disease: Secondary | ICD-10-CM | POA: Diagnosis present

## 2015-08-11 DIAGNOSIS — Z992 Dependence on renal dialysis: Secondary | ICD-10-CM

## 2015-08-11 DIAGNOSIS — R34 Anuria and oliguria: Secondary | ICD-10-CM | POA: Diagnosis present

## 2015-08-11 DIAGNOSIS — A412 Sepsis due to unspecified staphylococcus: Secondary | ICD-10-CM | POA: Diagnosis not present

## 2015-08-11 DIAGNOSIS — R6521 Severe sepsis with septic shock: Secondary | ICD-10-CM | POA: Diagnosis not present

## 2015-08-11 DIAGNOSIS — Z22322 Carrier or suspected carrier of Methicillin resistant Staphylococcus aureus: Secondary | ICD-10-CM

## 2015-08-11 DIAGNOSIS — IMO0002 Reserved for concepts with insufficient information to code with codable children: Secondary | ICD-10-CM | POA: Diagnosis present

## 2015-08-11 DIAGNOSIS — D638 Anemia in other chronic diseases classified elsewhere: Secondary | ICD-10-CM | POA: Diagnosis present

## 2015-08-11 DIAGNOSIS — D72829 Elevated white blood cell count, unspecified: Secondary | ICD-10-CM | POA: Diagnosis not present

## 2015-08-11 DIAGNOSIS — B9689 Other specified bacterial agents as the cause of diseases classified elsewhere: Secondary | ICD-10-CM | POA: Diagnosis not present

## 2015-08-11 DIAGNOSIS — R000 Tachycardia, unspecified: Secondary | ICD-10-CM | POA: Diagnosis not present

## 2015-08-11 DIAGNOSIS — N186 End stage renal disease: Secondary | ICD-10-CM | POA: Diagnosis not present

## 2015-08-11 DIAGNOSIS — Z6836 Body mass index (BMI) 36.0-36.9, adult: Secondary | ICD-10-CM

## 2015-08-11 DIAGNOSIS — Z89512 Acquired absence of left leg below knee: Secondary | ICD-10-CM

## 2015-08-11 DIAGNOSIS — B9562 Methicillin resistant Staphylococcus aureus infection as the cause of diseases classified elsewhere: Secondary | ICD-10-CM | POA: Diagnosis not present

## 2015-08-11 DIAGNOSIS — T827XXA Infection and inflammatory reaction due to other cardiac and vascular devices, implants and grafts, initial encounter: Secondary | ICD-10-CM | POA: Diagnosis not present

## 2015-08-11 DIAGNOSIS — K219 Gastro-esophageal reflux disease without esophagitis: Secondary | ICD-10-CM | POA: Diagnosis present

## 2015-08-11 DIAGNOSIS — N189 Chronic kidney disease, unspecified: Secondary | ICD-10-CM | POA: Diagnosis not present

## 2015-08-11 DIAGNOSIS — Y838 Other surgical procedures as the cause of abnormal reaction of the patient, or of later complication, without mention of misadventure at the time of the procedure: Secondary | ICD-10-CM | POA: Diagnosis not present

## 2015-08-11 DIAGNOSIS — J9811 Atelectasis: Secondary | ICD-10-CM | POA: Diagnosis not present

## 2015-08-11 DIAGNOSIS — T827XXD Infection and inflammatory reaction due to other cardiac and vascular devices, implants and grafts, subsequent encounter: Secondary | ICD-10-CM | POA: Diagnosis not present

## 2015-08-11 DIAGNOSIS — I120 Hypertensive chronic kidney disease with stage 5 chronic kidney disease or end stage renal disease: Secondary | ICD-10-CM | POA: Diagnosis not present

## 2015-08-11 DIAGNOSIS — D649 Anemia, unspecified: Secondary | ICD-10-CM | POA: Diagnosis not present

## 2015-08-11 DIAGNOSIS — I288 Other diseases of pulmonary vessels: Secondary | ICD-10-CM | POA: Diagnosis not present

## 2015-08-11 DIAGNOSIS — I4581 Long QT syndrome: Secondary | ICD-10-CM | POA: Diagnosis present

## 2015-08-11 DIAGNOSIS — I959 Hypotension, unspecified: Secondary | ICD-10-CM | POA: Diagnosis not present

## 2015-08-11 DIAGNOSIS — Z794 Long term (current) use of insulin: Secondary | ICD-10-CM | POA: Diagnosis not present

## 2015-08-11 DIAGNOSIS — T8112XD Postprocedural septic shock, subsequent encounter: Secondary | ICD-10-CM | POA: Diagnosis not present

## 2015-08-11 DIAGNOSIS — I5042 Chronic combined systolic (congestive) and diastolic (congestive) heart failure: Secondary | ICD-10-CM | POA: Diagnosis present

## 2015-08-11 DIAGNOSIS — I514 Myocarditis, unspecified: Secondary | ICD-10-CM | POA: Diagnosis not present

## 2015-08-11 DIAGNOSIS — K449 Diaphragmatic hernia without obstruction or gangrene: Secondary | ICD-10-CM | POA: Diagnosis present

## 2015-08-11 DIAGNOSIS — Z7982 Long term (current) use of aspirin: Secondary | ICD-10-CM | POA: Diagnosis not present

## 2015-08-11 DIAGNOSIS — E1129 Type 2 diabetes mellitus with other diabetic kidney complication: Secondary | ICD-10-CM | POA: Diagnosis not present

## 2015-08-11 DIAGNOSIS — R6520 Severe sepsis without septic shock: Secondary | ICD-10-CM | POA: Diagnosis not present

## 2015-08-11 DIAGNOSIS — I9581 Postprocedural hypotension: Secondary | ICD-10-CM | POA: Diagnosis not present

## 2015-08-11 DIAGNOSIS — Z841 Family history of disorders of kidney and ureter: Secondary | ICD-10-CM | POA: Diagnosis not present

## 2015-08-11 DIAGNOSIS — E8770 Fluid overload, unspecified: Secondary | ICD-10-CM | POA: Diagnosis not present

## 2015-08-11 DIAGNOSIS — R0902 Hypoxemia: Secondary | ICD-10-CM | POA: Diagnosis not present

## 2015-08-11 DIAGNOSIS — T82898A Other specified complication of vascular prosthetic devices, implants and grafts, initial encounter: Secondary | ICD-10-CM | POA: Diagnosis not present

## 2015-08-11 DIAGNOSIS — E1165 Type 2 diabetes mellitus with hyperglycemia: Secondary | ICD-10-CM | POA: Diagnosis present

## 2015-08-11 DIAGNOSIS — I340 Nonrheumatic mitral (valve) insufficiency: Secondary | ICD-10-CM | POA: Diagnosis present

## 2015-08-11 DIAGNOSIS — Z79899 Other long term (current) drug therapy: Secondary | ICD-10-CM

## 2015-08-11 DIAGNOSIS — A4102 Sepsis due to Methicillin resistant Staphylococcus aureus: Secondary | ICD-10-CM | POA: Diagnosis not present

## 2015-08-11 DIAGNOSIS — E1310 Other specified diabetes mellitus with ketoacidosis without coma: Secondary | ICD-10-CM | POA: Diagnosis present

## 2015-08-11 DIAGNOSIS — N2581 Secondary hyperparathyroidism of renal origin: Secondary | ICD-10-CM | POA: Diagnosis present

## 2015-08-11 DIAGNOSIS — T829XXA Unspecified complication of cardiac and vascular prosthetic device, implant and graft, initial encounter: Secondary | ICD-10-CM

## 2015-08-11 DIAGNOSIS — E1122 Type 2 diabetes mellitus with diabetic chronic kidney disease: Secondary | ICD-10-CM | POA: Diagnosis not present

## 2015-08-11 DIAGNOSIS — I330 Acute and subacute infective endocarditis: Secondary | ICD-10-CM | POA: Diagnosis not present

## 2015-08-11 DIAGNOSIS — B9561 Methicillin susceptible Staphylococcus aureus infection as the cause of diseases classified elsewhere: Secondary | ICD-10-CM | POA: Diagnosis not present

## 2015-08-11 DIAGNOSIS — N184 Chronic kidney disease, stage 4 (severe): Secondary | ICD-10-CM | POA: Diagnosis not present

## 2015-08-11 DIAGNOSIS — I739 Peripheral vascular disease, unspecified: Secondary | ICD-10-CM | POA: Diagnosis present

## 2015-08-11 DIAGNOSIS — R0602 Shortness of breath: Secondary | ICD-10-CM | POA: Diagnosis not present

## 2015-08-11 DIAGNOSIS — I519 Heart disease, unspecified: Secondary | ICD-10-CM | POA: Diagnosis present

## 2015-08-11 DIAGNOSIS — Y832 Surgical operation with anastomosis, bypass or graft as the cause of abnormal reaction of the patient, or of later complication, without mention of misadventure at the time of the procedure: Secondary | ICD-10-CM | POA: Diagnosis present

## 2015-08-11 DIAGNOSIS — Z95828 Presence of other vascular implants and grafts: Secondary | ICD-10-CM

## 2015-08-11 LAB — COMPREHENSIVE METABOLIC PANEL
ALBUMIN: 3 g/dL — AB (ref 3.5–5.0)
ALK PHOS: 72 U/L (ref 38–126)
ALT: 15 U/L — ABNORMAL LOW (ref 17–63)
ANION GAP: 21 — AB (ref 5–15)
AST: 19 U/L (ref 15–41)
BUN: 49 mg/dL — ABNORMAL HIGH (ref 6–20)
CALCIUM: 8.3 mg/dL — AB (ref 8.9–10.3)
CHLORIDE: 86 mmol/L — AB (ref 101–111)
CO2: 25 mmol/L (ref 22–32)
Creatinine, Ser: 12.61 mg/dL — ABNORMAL HIGH (ref 0.61–1.24)
GFR calc Af Amer: 5 mL/min — ABNORMAL LOW (ref >60)
GFR calc non Af Amer: 4 mL/min — ABNORMAL LOW (ref >60)
GLUCOSE: 410 mg/dL — AB (ref 65–99)
POTASSIUM: 4.1 mmol/L (ref 3.5–5.1)
SODIUM: 132 mmol/L — AB (ref 135–145)
Total Bilirubin: 1.5 mg/dL — ABNORMAL HIGH (ref 0.3–1.2)
Total Protein: 7.6 g/dL (ref 6.5–8.1)

## 2015-08-11 LAB — CBC WITH DIFFERENTIAL/PLATELET
BASOS ABS: 0 10*3/uL (ref 0.0–0.1)
Basophils Relative: 0 %
Eosinophils Absolute: 0 10*3/uL (ref 0.0–0.7)
Eosinophils Relative: 0 %
HEMATOCRIT: 32.6 % — AB (ref 39.0–52.0)
HEMOGLOBIN: 10.2 g/dL — AB (ref 13.0–17.0)
LYMPHS ABS: 0.8 10*3/uL (ref 0.7–4.0)
LYMPHS PCT: 4 %
MCH: 29.7 pg (ref 26.0–34.0)
MCHC: 31.3 g/dL (ref 30.0–36.0)
MCV: 94.8 fL (ref 78.0–100.0)
Monocytes Absolute: 1.5 10*3/uL — ABNORMAL HIGH (ref 0.1–1.0)
Monocytes Relative: 8 %
NEUTROS ABS: 17.8 10*3/uL — AB (ref 1.7–7.7)
NEUTROS PCT: 89 %
Platelets: 71 10*3/uL — ABNORMAL LOW (ref 150–400)
RBC: 3.44 MIL/uL — AB (ref 4.22–5.81)
RDW: 17.4 % — ABNORMAL HIGH (ref 11.5–15.5)
WBC: 20.2 10*3/uL — AB (ref 4.0–10.5)

## 2015-08-11 LAB — I-STAT CG4 LACTIC ACID, ED
LACTIC ACID, VENOUS: 1.44 mmol/L (ref 0.5–2.0)
Lactic Acid, Venous: 2.37 mmol/L (ref 0.5–2.0)

## 2015-08-11 LAB — CBG MONITORING, ED
GLUCOSE-CAPILLARY: 255 mg/dL — AB (ref 65–99)
GLUCOSE-CAPILLARY: 210 mg/dL — AB (ref 65–99)
Glucose-Capillary: 337 mg/dL — ABNORMAL HIGH (ref 65–99)

## 2015-08-11 LAB — GLUCOSE, CAPILLARY: Glucose-Capillary: 145 mg/dL — ABNORMAL HIGH (ref 65–99)

## 2015-08-11 LAB — INFLUENZA PANEL BY PCR (TYPE A & B)
H1N1FLUPCR: NOT DETECTED
Influenza A By PCR: NEGATIVE
Influenza B By PCR: NEGATIVE

## 2015-08-11 LAB — C DIFFICILE QUICK SCREEN W PCR REFLEX
C DIFFICILE (CDIFF) TOXIN: NEGATIVE
C DIFFICLE (CDIFF) ANTIGEN: NEGATIVE
C Diff interpretation: NEGATIVE

## 2015-08-11 MED ORDER — LIDOCAINE HCL 1 % IJ SOLN
INTRAMUSCULAR | Status: AC
Start: 2015-08-11 — End: 2015-08-12
  Filled 2015-08-11: qty 20

## 2015-08-11 MED ORDER — CALCIUM CARBONATE ANTACID 500 MG PO CHEW
4.0000 | CHEWABLE_TABLET | Freq: Three times a day (TID) | ORAL | Status: DC
  Administered 2015-08-12 – 2015-08-17 (×9): 800 mg via ORAL
  Filled 2015-08-11 (×10): qty 4

## 2015-08-11 MED ORDER — CALCITRIOL 0.5 MCG PO CAPS: 0.5000 ug | ORAL_CAPSULE | Freq: Every day | ORAL | Status: DC

## 2015-08-11 MED ORDER — PIPERACILLIN-TAZOBACTAM 3.375 G IVPB 30 MIN
3.3750 g | Freq: Once | INTRAVENOUS | Status: AC
  Administered 2015-08-11: 3.375 g via INTRAVENOUS
  Filled 2015-08-11: qty 50

## 2015-08-11 MED ORDER — ASPIRIN EC 81 MG PO TBEC
81.0000 mg | DELAYED_RELEASE_TABLET | Freq: Every day | ORAL | Status: DC
  Administered 2015-08-11 – 2015-08-19 (×9): 81 mg via ORAL
  Filled 2015-08-11 (×10): qty 1

## 2015-08-11 MED ORDER — PIPERACILLIN-TAZOBACTAM IN DEX 2-0.25 GM/50ML IV SOLN
2.2500 g | Freq: Three times a day (TID) | INTRAVENOUS | Status: DC
  Administered 2015-08-11 – 2015-08-13 (×5): 2.25 g via INTRAVENOUS
  Filled 2015-08-11 (×6): qty 50

## 2015-08-11 MED ORDER — INSULIN DETEMIR 100 UNIT/ML ~~LOC~~ SOLN
15.0000 [IU] | Freq: Every day | SUBCUTANEOUS | Status: DC
  Administered 2015-08-12 – 2015-08-18 (×8): 15 [IU] via SUBCUTANEOUS
  Filled 2015-08-11 (×12): qty 0.15

## 2015-08-11 MED ORDER — VANCOMYCIN HCL IN DEXTROSE 1-5 GM/200ML-% IV SOLN
1000.0000 mg | INTRAVENOUS | Status: AC
  Administered 2015-08-12: 1000 mg via INTRAVENOUS
  Filled 2015-08-11 (×2): qty 200

## 2015-08-11 MED ORDER — CALCITRIOL 0.5 MCG PO CAPS: 0.5000 ug | ORAL_CAPSULE | ORAL | Status: DC

## 2015-08-11 MED ORDER — CALCITRIOL 0.5 MCG PO CAPS: 2.5000 ug | ORAL_CAPSULE | ORAL | Status: DC

## 2015-08-11 MED ORDER — PANTOPRAZOLE SODIUM 40 MG PO TBEC
40.0000 mg | DELAYED_RELEASE_TABLET | Freq: Two times a day (BID) | ORAL | Status: DC
  Administered 2015-08-11 – 2015-08-17 (×10): 40 mg via ORAL
  Filled 2015-08-11 (×12): qty 1

## 2015-08-11 MED ORDER — VANCOMYCIN HCL IN DEXTROSE 1-5 GM/200ML-% IV SOLN
1000.0000 mg | INTRAVENOUS | Status: DC
  Administered 2015-08-15 – 2015-08-16 (×2): 1000 mg via INTRAVENOUS
  Filled 2015-08-11 (×5): qty 200

## 2015-08-11 MED ORDER — SODIUM CHLORIDE 0.9 % IV SOLN
INTRAVENOUS | Status: DC
  Administered 2015-08-11 – 2015-08-14 (×2): via INTRAVENOUS

## 2015-08-11 MED ORDER — SODIUM CHLORIDE 0.9 % IV BOLUS (SEPSIS)
500.0000 mL | INTRAVENOUS | Status: AC
  Administered 2015-08-11: 500 mL via INTRAVENOUS

## 2015-08-11 MED ORDER — CALCITRIOL 0.5 MCG PO CAPS
0.5000 ug | ORAL_CAPSULE | Freq: Once | ORAL | Status: AC
  Administered 2015-08-12: 0.5 ug via ORAL
  Filled 2015-08-11: qty 1

## 2015-08-11 MED ORDER — INSULIN ASPART 100 UNIT/ML ~~LOC~~ SOLN
10.0000 [IU] | Freq: Once | SUBCUTANEOUS | Status: AC
  Administered 2015-08-11: 10 [IU] via SUBCUTANEOUS
  Filled 2015-08-11: qty 1

## 2015-08-11 MED ORDER — FAMOTIDINE 20 MG PO TABS
20.0000 mg | ORAL_TABLET | Freq: Every day | ORAL | Status: DC
  Administered 2015-08-11: 20 mg via ORAL
  Filled 2015-08-11: qty 1

## 2015-08-11 MED ORDER — HEPARIN SODIUM (PORCINE) 1000 UNIT/ML IJ SOLN
INTRAMUSCULAR | Status: AC
  Filled 2015-08-11: qty 1

## 2015-08-11 MED ORDER — SODIUM CHLORIDE 0.9 % IV BOLUS (SEPSIS)
1000.0000 mL | INTRAVENOUS | Status: AC
  Administered 2015-08-11 (×2): 1000 mL via INTRAVENOUS

## 2015-08-11 MED ORDER — CALCITRIOL 0.5 MCG PO CAPS
0.5000 ug | ORAL_CAPSULE | ORAL | Status: DC
  Administered 2015-08-12: 0.5 ug via ORAL

## 2015-08-11 MED ORDER — ACETAMINOPHEN 325 MG PO TABS
650.0000 mg | ORAL_TABLET | Freq: Four times a day (QID) | ORAL | Status: DC | PRN
  Administered 2015-08-11 – 2015-08-19 (×8): 650 mg via ORAL
  Filled 2015-08-11 (×7): qty 2

## 2015-08-11 MED ORDER — INSULIN ASPART 100 UNIT/ML ~~LOC~~ SOLN
0.0000 [IU] | Freq: Three times a day (TID) | SUBCUTANEOUS | Status: DC
  Administered 2015-08-11: 3 [IU] via SUBCUTANEOUS
  Administered 2015-08-13: 2 [IU] via SUBCUTANEOUS
  Administered 2015-08-13 – 2015-08-14 (×2): 1 [IU] via SUBCUTANEOUS
  Administered 2015-08-15: 2 [IU] via SUBCUTANEOUS
  Administered 2015-08-15: 1 [IU] via SUBCUTANEOUS
  Administered 2015-08-15 – 2015-08-18 (×4): 2 [IU] via SUBCUTANEOUS
  Administered 2015-08-18: 3 [IU] via SUBCUTANEOUS
  Filled 2015-08-11: qty 1

## 2015-08-11 MED ORDER — INSULIN ASPART 100 UNIT/ML ~~LOC~~ SOLN: 0.0000 [IU] | Freq: Three times a day (TID) | SUBCUTANEOUS | Status: DC

## 2015-08-11 MED ORDER — VANCOMYCIN HCL 10 G IV SOLR
2500.0000 mg | Freq: Once | INTRAVENOUS | Status: AC
  Administered 2015-08-11: 2500 mg via INTRAVENOUS
  Filled 2015-08-11: qty 2500

## 2015-08-11 MED ORDER — SODIUM CHLORIDE 0.9% FLUSH
3.0000 mL | Freq: Two times a day (BID) | INTRAVENOUS | Status: DC
  Administered 2015-08-11 – 2015-08-18 (×12): 3 mL via INTRAVENOUS

## 2015-08-11 MED ORDER — RENA-VITE PO TABS
1.0000 | ORAL_TABLET | Freq: Every day | ORAL | Status: DC
  Administered 2015-08-11 – 2015-08-18 (×7): 1 via ORAL
  Filled 2015-08-11 (×8): qty 1

## 2015-08-11 MED ORDER — ACETAMINOPHEN 325 MG PO TABS
650.0000 mg | ORAL_TABLET | Freq: Once | ORAL | Status: AC
  Administered 2015-08-11: 650 mg via ORAL
  Filled 2015-08-11: qty 2

## 2015-08-11 MED ORDER — HEPARIN SODIUM (PORCINE) 5000 UNIT/ML IJ SOLN
5000.0000 [IU] | Freq: Three times a day (TID) | INTRAMUSCULAR | Status: DC
  Administered 2015-08-11 – 2015-08-18 (×18): 5000 [IU] via SUBCUTANEOUS
  Filled 2015-08-11 (×18): qty 1

## 2015-08-11 NOTE — Consult Note (Addendum)
[facility] Renal Consultation Note  Indication for Consultation:  Management of ESRD/hemodialysis; anemia, hypertension/volume and secondary hyperparathyroidism  HPI: [name] is a 50 y.o. male with HTN, DM2 and ESRD on HD MWF at [facility].  Hx multiple access problems/ failures.  Has a left upper arm HeRO access which is occluded. He went to [facility] for thrombolysis but was found to be febrile so procedure was canceled and patient sent to ED and admitted.  He describes new fevers w/o CP, cough, abd pain, n/v/d.  No drainage from access, no ulcers or wounds and no HA or blurred vision. Hx of L BKA.    Past Medical History  Diagnosis Date  . Hypertension   . Chest pain   . Morbid obesity ([place])   . Bronchitis   . Blood transfusion   . H/O hiatal hernia   . Chronic kidney disease     d[facility], [facility]  . GERD (gastroesophageal reflux disease)   . Dysrhythmia     irregular heartbeat  . Hyperparathyroidism ([place])   . ESRD (end stage renal disease) on dialysis ([place])     first HD 06/19/04  . Pneumonia   . Diabetes mellitus     type 2  . Anemia     Past Surgical History  Procedure Laterality Date  . Av fistula placement  10/11/2010  . Ankle fusion      rt foot charcot joint 2008 with refusion  . Av fistula placement  07/02/2011    Procedure: INSERTION OF ARTERIOVENOUS (AV) GORE-TEX GRAFT ARM;  Surgeon: Elam Dutch, MD;  Location: [place];  Service: Vascular;  Laterality: Left;  . Thrombectomy    . Insertion of dialysis catheter  01/20/2012    Procedure: INSERTION OF DIALYSIS CATHETER;  Surgeon: Serafina Mitchell, MD;  Location: [place];  Service: Vascular;  Laterality: N/A;  insertion left internal jugular dialysis catheter 23cm  . Umbilcial hernia    . Parathyroidectomy    . Avgg removal Right 11/06/2012    Procedure: REMOVAL OF ARTERIOVENOUS GORETEX GRAFT ([place]);  Surgeon: Mal Misty, MD;  Location: [place];  Service: Vascular;   Laterality: Right;  . Av fistula placement Right 11/06/2012    Procedure: INSERTION OF ARTERIOVENOUS (AV) GORE-TEX GRAFT ARM;  Surgeon: Mal Misty, MD;  Location: [place];  Service: Vascular;  Laterality: Right;  . Insertion of dialysis catheter Right 11/09/2012    Procedure: INSERTION OF DIALYSIS CATHETER;  Surgeon: Conrad Malaga, MD;  Location: [place];  Service: Vascular;  Laterality: Right;  Right  Femoral Vein  . Venogram N/A 01/06/2014    Procedure: VENOGRAM;  Surgeon: Conrad Lakeway, MD;  Location: [facility] C[facility] LAB;  Service: Cardiovascular;  Laterality: N/A;  . Amputation Left 01/20/2015    Procedure: Left Great Toe Amputation;  Surgeon: Newt Minion, MD;  Location: [place];  Service: Orthopedics;  Laterality: Left;  . Amputation Left 03/07/2015    Procedure: Left Foot 1st and 2nd Ray Amputation;  Surgeon: Newt Minion, MD;  Location: [place];  Service: Orthopedics;  Laterality: Left;  . Amputation Left 04/14/2015    Procedure: LEFT BELOW THE KNEE AMPUTATION ;  Surgeon: Newt Minion, MD;  Location: [place];  Service: Orthopedics;  Laterality: Left;      Family History  Problem Relation Age of Onset  . Heart disease Mother   . Kidney disease Mother   . Hypertension Mother  reports that he quit smoking about 6 months ago. His smoking use included Cigarettes. He has a 2 pack-year smoking history. He has never used smokeless tobacco. He reports that he does not drink alcohol or use illicit drugs.  No Known Allergies  Prior to Admission medications   Medication Sig Start Date End Date Taking? Authorizing Provider  aspirin EC 325 MG EC tablet Take 1 tablet (325 mg total) by mouth daily. 04/26/15  Yes Daniel J Angiulli, PA-C  b complex-vitamin c-folic acid (NEPHRO-VITE) 0.8 MG TABS tablet Take 1 tablet by mouth at bedtime. 04/26/15  Yes Daniel J Angiulli, PA-C  calcitRIOL (ROCALTROL) 0.5 MCG capsule Take 5 capsules (2.5 mcg total) by mouth every Monday, Wednesday, and Friday with hemodialysis.  04/26/15  Yes Daniel J Angiulli, PA-C  calcium carbonate (TUMS - DOSED IN MG ELEMENTAL CALCIUM) 500 MG chewable tablet Chew 4 tablets (800 mg of elemental calcium total) by mouth 3 (three) times daily with meals. 04/26/15  Yes Daniel J Angiulli, PA-C  famotidine (PEPCID) 20 MG tablet Take 1 tablet (20 mg total) by mouth at bedtime. 04/26/15  Yes Daniel J Angiulli, PA-C  insulin detemir (LEVEMIR) 100 UNIT/ML injection Inject 0.2 mLs (20 Units total) into the skin at bedtime. 04/26/15  Yes Daniel J Angiulli, PA-C  methocarbamol (ROBAXIN) 500 MG tablet Take 1 tablet (500 mg total) by mouth every 6 (six) hours as needed for muscle spasms. 04/26/15  Yes Daniel J Angiulli, PA-C  oxyCODONE (OXY IR/ROXICODONE) 5 MG immediate release tablet Take 1-2 tablets (5-10 mg total) by mouth every 3 (three) hours as needed for breakthrough pain. 04/26/15  Yes Daniel J Angiulli, PA-C  pantoprazole (PROTONIX) 40 MG tablet Take 1 tablet (40 mg total) by mouth 2 (two) times daily. 04/26/15  Yes Lavon Paganini Angiulli, PA-C     Anti-infectives    Start     Dose/Rate Route Frequency Ordered Stop   08/11/15 1600  piperacillin-tazobactam (ZOSYN) IVPB 2.25 g     2.25 g 100 mL/hr over 30 Minutes Intravenous 3 times per day 08/11/15 1107     08/11/15 1000  vancomycin (VANCOCIN) 2,500 mg in sodium chloride 0.9 % 500 mL IVPB     2,500 mg 250 mL/hr over 120 Minutes Intravenous  Once 08/11/15 0931 08/11/15 1249   08/11/15 1000  piperacillin-tazobactam (ZOSYN) IVPB 3.375 g     3.375 g 100 mL/hr over 30 Minutes Intravenous  Once 08/11/15 0931 08/11/15 1026      Results for orders placed or performed during the hospital encounter of 08/11/15 (from the past 48 hour(s))  Comprehensive metabolic panel     Status: Abnormal   Collection Time: 08/11/15  9:28 AM  Result Value Ref Range   Sodium 132 (L) 135 - 145 mmol/L   Potassium 4.1 3.5 - 5.1 mmol/L   Chloride 86 (L) 101 - 111 mmol/L   CO2 25 22 - 32 mmol/L   Glucose, Bld 410 (H)  65 - 99 mg/dL   BUN 49 (H) 6 - 20 mg/dL   Creatinine, Ser 12.61 (H) 0.61 - 1.24 mg/dL   Calcium 8.3 (L) 8.9 - 10.3 mg/dL   Total Protein 7.6 6.5 - 8.1 g/dL   Albumin 3.0 (L) 3.5 - 5.0 g/dL   AST 19 15 - 41 U/L   ALT 15 (L) 17 - 63 U/L   Alkaline Phosphatase 72 38 - 126 U/L   Total Bilirubin 1.5 (H) 0.3 - 1.2 mg/dL   GFR calc non Af Amer 4 (  L) >60 mL/min   GFR calc Af Amer 5 (L) >60 mL/min    Comment: (NOTE) The eGFR has been calculated using the CKD EPI equation. This calculation has not been validated in all clinical situations. eGFR's persistently <60 mL/min signify possible Chronic Kidney Disease.    Anion gap 21 (H) 5 - 15  CBC with Differential     Status: Abnormal   Collection Time: 08/11/15  9:28 AM  Result Value Ref Range   WBC 20.2 (H) 4.0 - 10.5 K/uL    Comment: REPEATED TO VERIFY   RBC 3.44 (L) 4.22 - 5.81 MIL/uL   Hemoglobin 10.2 (L) 13.0 - 17.0 g/dL    Comment: REPEATED TO VERIFY   HCT 32.6 (L) 39.0 - 52.0 %   MCV 94.8 78.0 - 100.0 fL   MCH 29.7 26.0 - 34.0 pg   MCHC 31.3 30.0 - 36.0 g/dL   RDW 17.4 (H) 11.5 - 15.5 %   Platelets 71 (L) 150 - 400 K/uL    Comment: SPECIMEN CHECKED FOR CLOTS PLATELET COUNT CONFIRMED BY SMEAR    Neutrophils Relative % 89 %   Neutro Abs 17.8 (H) 1.7 - 7.7 K/uL   Lymphocytes Relative 4 %   Lymphs Abs 0.8 0.7 - 4.0 K/uL   Monocytes Relative 8 %   Monocytes Absolute 1.5 (H) 0.1 - 1.0 K/uL   Eosinophils Relative 0 %   Eosinophils Absolute 0.0 0.0 - 0.7 K/uL   Basophils Relative 0 %   Basophils Absolute 0.0 0.0 - 0.1 K/uL  I-Stat CG4 Lactic Acid, ED (Not at [facility])     Status: Abnormal   Collection Time: 08/11/15  9:31 AM  Result Value Ref Range   Lactic Acid, Venous 2.37 (HH) 0.5 - 2.0 mmol/L   Comment NOTIFIED PHYSICIAN   CBG monitoring, ED     Status: Abnormal   Collection Time: 08/11/15 12:51 PM  Result Value Ref Range   Glucose-Capillary 337 (H) 65 - 99 mg/dL  I-Stat CG4 Lactic Acid, ED  (not at  [facility])     Status: None    Collection Time: 08/11/15  1:32 PM  Result Value Ref Range   Lactic Acid, Venous 1.44 0.5 - 2.0 mmol/L     ROS: see hpi  Physical Exam: Filed Vitals:   08/11/15 1230 08/11/15 1300  BP: 75/40 114/78  Pulse: 118 126  Temp:    Resp: 19      General:alert Chronically Il AAM., NAD on ER stretcher  HEENT: [place] , EOMI   Neck: no jvd  Heart: Tachy reg. No mur, rub or gallop Lungs: L base coarse BS , otherwise CTA / nonlabored breathing  Abdomen: obese  BS pos soft ,NT, ND Extremities: R Charcot foot / L BKA /trace R lower leg edema Skin: no pedal ulcer.and L bka stump clear Neuro: Alert OX3 , no acute focal deficts for him  from his basekline Dialysis Access: L upper arm Hero Access no bruit or thrill   Dialysis Orders: [facility]   On MWF. EDW 110.5  HD Bath 2k,2ca  Time 4hr 69mn Heparin 4500 bolus., 1800 intermed  Access L Hero   Calcitriol0.5 mcg IV/HD Mircera 50 q2wks last on next on feb1   Units IV/HD  Venofer  50 q wed hd  Other op lab HGB 11.2, ca8 phos 10.5 pth 529   Assessment/Plan 1. Clotted L hero HD access= appreciate IR assist with temp HD cath planned.  VVS consulted re clotted HeRO.  2.  Febrile Illness= ?? Etiology/ admit team wu/ Outer portion of Hero Hd graft not grossly infected as PER DR. Early /  On Iv antibiotics 3. ESRD -  k 4.9   Vol ok,Usually MWF HD schedule ([facility]) HD tomor with Temp hd cath .  4. Chron hypotension takes midodrine pre HD only 5. Volume - up 4-6 kg by bed scales 6. Anemia  - hgb 10.2  esa due next wed / weekly fe on hd 7. Metabolic bone disease -  Po vit d on hd / OP phos elavated 10.5  compiance binder chronic problem  Renal diet and Velphor binder  8. DM type 2 9. PVD sp L BKA  Ernest Haber, PA-C [place] [phone] 08/11/2015, 1:49 PM   Pt seen, examined and agree w A/P as above. ESRD patient with multiple access issues presented with clotted HeRO access to OP vascular center but was found to be febrile.  Admitted now for w/u. Blood cx's sent, CXR negative, no URI symptoms or GI for that matter. Empiric abx and will need temp cath until fever worked up, IR going to see him for this.  Kelly Splinter MD [employer] pager [phone]    cell [phone] 08/12/2015, 7:41 AM

## 2015-08-11 NOTE — Progress Notes (Signed)
ANTIBIOTIC CONSULT NOTE - INITIAL  Pharmacy Consult for Zosyn and vancomycin Indication: Sepsis  No Known Allergies  Patient Measurements: Height: 6' (182.9 cm) Weight: 260 lb (117.935 kg) IBW/kg (Calculated) : 77.6  Vital Signs: Temp: 101.3 F (38.5 C) (01/27 0910) Temp Source: Oral (01/27 0910) BP: 95/54 mmHg (01/27 0910) Pulse Rate: 136 (01/27 0910) Intake/Output from previous day:   Intake/Output from this shift:    Labs: No results for input(s): WBC, HGB, PLT, LABCREA, CREATININE in the last 72 hours. CrCl cannot be calculated (Patient has no serum creatinine result on file.). No results for input(s): VANCOTROUGH, VANCOPEAK, VANCORANDOM, GENTTROUGH, GENTPEAK, GENTRANDOM, TOBRATROUGH, TOBRAPEAK, TOBRARND, AMIKACINPEAK, AMIKACINTROU, AMIKACIN in the last 72 hours.   Microbiology: No results found for this or any previous visit (from the past 720 hour(s)).  Medical History: Past Medical History  Diagnosis Date  . Hypertension   . Chest pain   . Morbid obesity ([place])   . Bronchitis   . Blood transfusion   . H/O hiatal hernia   . Chronic kidney disease     d[facility], [facility]  . GERD (gastroesophageal reflux disease)   . Dysrhythmia     irregular heartbeat  . Hyperparathyroidism ([place])   . ESRD (end stage renal disease) on dialysis ([place])     first HD 06/19/04  . Pneumonia   . Diabetes mellitus     type 2  . Anemia     Assessment: 50 yo M presents on 1/27 with tachycardia and fever. Found to be a code sepsis. Pharmacy consulted for abx. Tmax up to 101.3 and labs pending. PMH includes ESRD with HD on MWF. Was due to have dialysis today.  Goal of Therapy:  Vancomycin trough level 15-20 mcg/ml  Resolution of infection  Plan:  Give Zosyn 3.375g IV x 1 (30 min infusion), then start Zosyn 2.25 gm IV q8h (4 hour infusion) Give vancomycin 2.5g IV x 1 Monitor clinical picture, renal function, VT prn F/U C&S, abx deescalation / LOT F/U nephro consult for  dialysis  May need another vancomycin dose later today if goes to HD  Elenor Quinones, PharmD, [facility] Clinical Pharmacist Pager [phone] 08/11/2015 9:41 AM

## 2015-08-11 NOTE — ED Notes (Signed)
Ordered dinner tray to go to the floor

## 2015-08-11 NOTE — ED Notes (Signed)
Dr. Johnney Killian and K. Rose, [place] notified of pt. Is potential suspected Sepsis

## 2015-08-11 NOTE — ED Provider Notes (Signed)
CSN: [id number]     Arrival date & time 08/11/15  [id number] History   First MD Initiated Contact with Patient 08/11/15 0901     Chief Complaint  Patient presents with  . Fever     (Consider location/radiation/quality/duration/timing/severity/associated sxs/prior Treatment) HPI [name] is a 50 y.o. male with PMH significant for DMT2, HTN, ESRD on HD MWF, bilateral BKA who presents with fever.  Patient reports he was at the vascular lab this morning for thrombectomy and angiogram for thrombosed dialysis graft when he was found to be tachycardic with HR 152 and febrile with tympanic temp 101.4.  Patient reports he had experienced 2 episodes of NB diarrhea for the past 2 days.  He is anuric.  He denies cough, SOB, CP, abdominal pain, N/V.  No modifying or aggravating factors.   Past Medical History  Diagnosis Date  . Hypertension   . Chest pain   . Morbid obesity ([place])   . Bronchitis   . Blood transfusion   . H/O hiatal hernia   . Chronic kidney disease     dialysis, [facility]  . GERD (gastroesophageal reflux disease)   . Dysrhythmia     irregular heartbeat  . Hyperparathyroidism ([place])   . ESRD (end stage renal disease) on dialysis ([place])     first HD 06/19/04  . Pneumonia   . Diabetes mellitus     type 2  . Anemia    Past Surgical History  Procedure Laterality Date  . Av fistula placement  10/11/2010  . Ankle fusion      rt foot charcot joint 2008 with refusion  . Av fistula placement  07/02/2011    Procedure: INSERTION OF ARTERIOVENOUS (AV) GORE-TEX GRAFT ARM;  Surgeon: Elam Dutch, MD;  Location: [place];  Service: Vascular;  Laterality: Left;  . Thrombectomy    . Insertion of dialysis catheter  01/20/2012    Procedure: INSERTION OF DIALYSIS CATHETER;  Surgeon: Serafina Mitchell, MD;  Location: [place];  Service: Vascular;  Laterality: N/A;  insertion left internal jugular dialysis catheter 23cm  . Umbilcial hernia    . Parathyroidectomy    . Avgg removal Right  11/06/2012    Procedure: REMOVAL OF ARTERIOVENOUS GORETEX GRAFT ([place]);  Surgeon: Mal Misty, MD;  Location: [place];  Service: Vascular;  Laterality: Right;  . Av fistula placement Right 11/06/2012    Procedure: INSERTION OF ARTERIOVENOUS (AV) GORE-TEX GRAFT ARM;  Surgeon: Mal Misty, MD;  Location: [place];  Service: Vascular;  Laterality: Right;  . Insertion of dialysis catheter Right 11/09/2012    Procedure: INSERTION OF DIALYSIS CATHETER;  Surgeon: Conrad Allen, MD;  Location: [place];  Service: Vascular;  Laterality: Right;  Right  Femoral Vein  . Venogram N/A 01/06/2014    Procedure: VENOGRAM;  Surgeon: Conrad Hawley, MD;  Location: [facility] CATH LAB;  Service: Cardiovascular;  Laterality: N/A;  . Amputation Left 01/20/2015    Procedure: Left Great Toe Amputation;  Surgeon: Newt Minion, MD;  Location: [place];  Service: Orthopedics;  Laterality: Left;  . Amputation Left 03/07/2015    Procedure: Left Foot 1st and 2nd Ray Amputation;  Surgeon: Newt Minion, MD;  Location: [place];  Service: Orthopedics;  Laterality: Left;  . Amputation Left 04/14/2015    Procedure: LEFT BELOW THE KNEE AMPUTATION ;  Surgeon: Newt Minion, MD;  Location: [place];  Service: Orthopedics;  Laterality: Left;   Family History  Problem Relation Age  of Onset  . Heart disease Mother   . Kidney disease Mother   . Hypertension Mother    Social History  Substance Use Topics  . Smoking status: Former Smoker -- 0.10 packs/day for 20 years    Types: Cigarettes    Quit date: 01/13/2015  . Smokeless tobacco: Never Used  . Alcohol Use: No    Review of Systems All other systems negative unless otherwise stated in HPI    Allergies  Review of patient's allergies indicates no known allergies.  Home Medications   Prior to Admission medications   Medication Sig Start Date End Date Taking? Authorizing Provider  aspirin EC 325 MG EC tablet Take 1 tablet (325 mg total) by mouth daily. 04/26/15  Yes Daniel J Angiulli, PA-C  b  complex-vitamin c-folic acid (NEPHRO-VITE) 0.8 MG TABS tablet Take 1 tablet by mouth at bedtime. 04/26/15  Yes Daniel J Angiulli, PA-C  calcitRIOL (ROCALTROL) 0.5 MCG capsule Take 5 capsules (2.5 mcg total) by mouth every Monday, Wednesday, and Friday with hemodialysis. 04/26/15  Yes Daniel J Angiulli, PA-C  calcium carbonate (TUMS - DOSED IN MG ELEMENTAL CALCIUM) 500 MG chewable tablet Chew 4 tablets (800 mg of elemental calcium total) by mouth 3 (three) times daily with meals. 04/26/15  Yes Daniel J Angiulli, PA-C  famotidine (PEPCID) 20 MG tablet Take 1 tablet (20 mg total) by mouth at bedtime. 04/26/15  Yes Daniel J Angiulli, PA-C  insulin detemir (LEVEMIR) 100 UNIT/ML injection Inject 0.2 mLs (20 Units total) into the skin at bedtime. 04/26/15  Yes Daniel J Angiulli, PA-C  methocarbamol (ROBAXIN) 500 MG tablet Take 1 tablet (500 mg total) by mouth every 6 (six) hours as needed for muscle spasms. 04/26/15  Yes Daniel J Angiulli, PA-C  oxyCODONE (OXY IR/ROXICODONE) 5 MG immediate release tablet Take 1-2 tablets (5-10 mg total) by mouth every 3 (three) hours as needed for breakthrough pain. 04/26/15  Yes Daniel J Angiulli, PA-C  pantoprazole (PROTONIX) 40 MG tablet Take 1 tablet (40 mg total) by mouth 2 (two) times daily. 04/26/15  Yes Daniel J Angiulli, PA-C   BP 114/60 mmHg  Pulse 129  Temp(Src) 101.3 F (38.5 C) (Oral)  Resp 16  Ht 6' (1.829 m)  Wt 117.935 kg  BMI 35.25 kg/m2  SpO2 100% Physical Exam  Constitutional: He is oriented to person, place, and time. He appears well-developed and well-nourished. He appears ill.  Patient appears sick, febrile T 101.3, BP 95/54  HENT:  Head: Normocephalic and atraumatic.  Mouth/Throat: Oropharynx is clear and moist.  Eyes: Conjunctivae are normal. Pupils are equal, round, and reactive to light.  Neck: Normal range of motion. Neck supple.  Cardiovascular: Regular rhythm and normal heart sounds.  Tachycardia present.   No murmur heard. HR 136 on  monitor.  Pulmonary/Chest: Effort normal. No accessory muscle usage or stridor. No respiratory distress. He has no wheezes. He has rhonchi. He has no rales.  Oxygen saturation 90% with 3L [place].  Patient not oxygen dependent.   Abdominal: Soft. Bowel sounds are normal. He exhibits no distension. There is no tenderness. There is no rebound and no guarding.  Musculoskeletal: Normal range of motion.  Lymphadenopathy:    He has no cervical adenopathy.  Neurological: He is alert and oriented to person, place, and time.  Speech clear without dysarthria.  Skin: Skin is warm and dry.  No signs of infection surrounding graft.  Psychiatric: He has a normal mood and affect. His behavior is normal.  Vitals reviewed.  ED Course  Procedures (including critical care time)  CRITICAL CARE Performed by: Gloriann Loan   Total critical care time: 30 minutes  Critical care time was exclusive of separately billable procedures and treating other patients.  Critical care was necessary to treat or prevent imminent or life-threatening deterioration.  Critical care was time spent personally by me on the following activities: development of treatment plan with patient and/or surrogate as well as nursing, discussions with consultants, evaluation of patient's response to treatment, examination of patient, obtaining history from patient or surrogate, ordering and performing treatments and interventions, ordering and review of laboratory studies, ordering and review of radiographic studies, pulse oximetry and re-evaluation of patient's condition.  Labs Review Labs Reviewed  COMPREHENSIVE METABOLIC PANEL - Abnormal; Notable for the following:    Sodium 132 (*)    Chloride 86 (*)    Glucose, Bld 410 (*)    BUN 49 (*)    Creatinine, Ser 12.61 (*)    Calcium 8.3 (*)    Albumin 3.0 (*)    ALT 15 (*)    Total Bilirubin 1.5 (*)    GFR calc non Af Amer 4 (*)    GFR calc Af Amer 5 (*)    Anion gap 21 (*)    All other  components within normal limits  CBC WITH DIFFERENTIAL/PLATELET - Abnormal; Notable for the following:    WBC 20.2 (*)    RBC 3.44 (*)    Hemoglobin 10.2 (*)    HCT 32.6 (*)    RDW 17.4 (*)    Platelets 71 (*)    Neutro Abs 17.8 (*)    Monocytes Absolute 1.5 (*)    All other components within normal limits  I-STAT CG4 LACTIC ACID, ED - Abnormal; Notable for the following:    Lactic Acid, Venous 2.37 (*)    All other components within normal limits  CULTURE, BLOOD (ROUTINE X 2)  CULTURE, BLOOD (ROUTINE X 2)  INFLUENZA PANEL BY PCR (TYPE A & B, H1N1)  I-STAT CG4 LACTIC ACID, ED    Imaging Review Dg Chest 2 View  08/11/2015  CLINICAL DATA:  Fever, shortness of breath for 3 days 10/21/2012 EXAM: CHEST  2 VIEW COMPARISON:  None. FINDINGS: Cardiomediastinal silhouette is stable. No acute infiltrate or pleural effusion. No pulmonary edema. There is a left subclavian central catheter with tip in right atrium. Bony thorax is unremarkable. IMPRESSION: No active cardiopulmonary disease. Left subclavian central catheter with tip in right atrium. Electronically Signed   By: Lahoma Crocker M.D.   On: 08/11/2015 09:46   I have personally reviewed and evaluated these images and lab results as part of my medical decision-making.   EKG Interpretation   Date/Time:  Friday August 11 2015 08:58:54 EST Ventricular Rate:  138 PR Interval:  166 QRS Duration: 83 QT Interval:  349 QTC Calculation: 529 R Axis:   38 Text Interpretation:  Sinus tachycardia Probable left atrial enlargement  Probable anteroseptal infarct, old Nonspecific T abnormalities, lateral  leads Prolonged QT interval Baseline wander in lead(s) II V1 agree. c/w  old slight increase lateral depression Confirmed by Johnney Killian, MD, Jeannie Done  [phone]) on 08/11/2015 9:12:30 AM      MDM   Final diagnoses:  Severe sepsis [facility])    Patient presents with sepsis with unknown source. Sepsis workup initiated.  Initial vitals, HR 136, RR 19, BP  95/54, oxygen saturation 90%, temp 101.3.  9:27 AM: Code Sepsis called.  Weight based IVF and IV abx  including vanc and zosyn initiated.  Lactic acid 2.37.  Patient meets criteria for severe sepsis.  CBC remarkable for WBC 20.2 and hgb 10.2, Plt 71. CMP remarkable for glucose 410, BUN 49, Cr 12.6, anion gap 21 with Na 132 and Cl 86.  CXR remarkable for left subclavian central catheter with tip in right atrium.  No active cardiopulmonary disease.  10:27 AM: patient's BP improved to 117/66 and HR 129 with IVF.  Will admit to medicine for severe sepsis of unknown origin.  Case has been discussed with and seen by Dr. Johnney Killian who agrees with the above plan for admission.      Gloriann Loan, PA-C 08/11/15 [place], MD 08/12/15 [phone]

## 2015-08-11 NOTE — ED Notes (Signed)
Patient transported to IR.  Per IR will do consent there.

## 2015-08-11 NOTE — ED Notes (Signed)
MD at bedside. 

## 2015-08-11 NOTE — H&P (Signed)
Date: 08/11/2015               Patient Name:  [name] MRN: [id number]  DOB: October 01, 1965 Age / Sex: 50 y.o., male   PCP: Delrae Rend, MD         Medical Service: Internal Medicine Teaching Service         Attending Physician: Dr. Charlesetta Shanks, MD    First Contact: Dr. Tiburcio Pea Pager: [phone]  Second Contact: Dr. Aurelio Brash Pager: [phone]       After Hours (After 5p/  First Contact Pager: [phone]  weekends / holidays): Second Contact Pager: [phone]   Chief Complaint: Fever  History of Present Illness: Pt is a 37 Y O M with PMH- DM2, PVD with hx of BKA, ESRD on HD, hyperparathyroidism, presented today from Vascular Surgeons office with fever- 101.4. Pt says he started feeling chills and having a fever on Wednesday, he did not check his temperature. He last went for dialysis on Wednesday, and then he was told that his HD access on his left upper arm was clotted off, and so he was following up with his vascular surgeon today. He also says he started having pain in his access area on Wednesday, but he denies differential warmth in the area, no redness.  Patient says he started having some loose stools - on Wednesday also, with 2 episodes of diarrhea daily since it started, but he has had only one episode today, no blood and no black stools, no vomiting, no abdominal pain. Pt denies sick contacts, no myalgias or sorethroat, no SOB, cough or chest pain, no headaches, photophobia or neck pain or stiffness. He doe snot make any urine.    Meds: Current Facility-Administered Medications  Medication Dose Route Frequency Provider Last Rate Last Dose  . piperacillin-tazobactam (ZOSYN) IVPB 2.25 g  2.25 g Intravenous 3 times per day Reginia Naas, RPH      . sodium chloride 0.9 % bolus 1,000 mL  1,000 mL Intravenous Q1H Kayla Rose, PA-C 1,000 mL/hr at 08/11/15 1052 1,000 mL at 08/11/15 1052   Followed by  . sodium chloride 0.9 % bolus 500 mL  500 mL Intravenous Q1H Gloriann Loan, PA-C         Current Outpatient Prescriptions  Medication Sig Dispense Refill  . aspirin EC 325 MG EC tablet Take 1 tablet (325 mg total) by mouth daily. 30 tablet 0  . b complex-vitamin c-folic acid (NEPHRO-VITE) 0.8 MG TABS tablet Take 1 tablet by mouth at bedtime. 30 tablet 0  . calcitRIOL (ROCALTROL) 0.5 MCG capsule Take 5 capsules (2.5 mcg total) by mouth every Monday, Wednesday, and Friday with hemodialysis. 30 capsule 0  . calcium carbonate (TUMS - DOSED IN MG ELEMENTAL CALCIUM) 500 MG chewable tablet Chew 4 tablets (800 mg of elemental calcium total) by mouth 3 (three) times daily with meals.    . famotidine (PEPCID) 20 MG tablet Take 1 tablet (20 mg total) by mouth at bedtime. 30 tablet 0  . insulin detemir (LEVEMIR) 100 UNIT/ML injection Inject 0.2 mLs (20 Units total) into the skin at bedtime. 10 mL 11  . methocarbamol (ROBAXIN) 500 MG tablet Take 1 tablet (500 mg total) by mouth every 6 (six) hours as needed for muscle spasms. 60 tablet 0  . oxyCODONE (OXY IR/ROXICODONE) 5 MG immediate release tablet Take 1-2 tablets (5-10 mg total) by mouth every 3 (three) hours as needed for breakthrough pain. 90 tablet 0  . pantoprazole (PROTONIX) 40 MG tablet  Take 1 tablet (40 mg total) by mouth 2 (two) times daily. 60 tablet 1    Allergies: Allergies as of 08/11/2015  . (No Known Allergies)   Past Medical History  Diagnosis Date  . Hypertension   . Chest pain   . Morbid obesity ([place])   . Bronchitis   . Blood transfusion   . H/O hiatal hernia   . Chronic kidney disease     d[facility], [facility]  . GERD (gastroesophageal reflux disease)   . Dysrhythmia     irregular heartbeat  . Hyperparathyroidism ([place])   . ESRD (end stage renal disease) on dialysis ([place])     first HD 06/19/04  . Pneumonia   . Diabetes mellitus     type 2  . Anemia    Past Surgical History  Procedure Laterality Date  . Av fistula placement  10/11/2010  . Ankle fusion      rt foot charcot joint 2008 with refusion  .  Av fistula placement  07/02/2011    Procedure: INSERTION OF ARTERIOVENOUS (AV) GORE-TEX GRAFT ARM;  Surgeon: Elam Dutch, MD;  Location: [place];  Service: Vascular;  Laterality: Left;  . Thrombectomy    . Insertion of dialysis catheter  01/20/2012    Procedure: INSERTION OF DIALYSIS CATHETER;  Surgeon: Serafina Mitchell, MD;  Location: [place];  Service: Vascular;  Laterality: N/A;  insertion left internal jugular dialysis catheter 23cm  . Umbilcial hernia    . Parathyroidectomy    . Avgg removal Right 11/06/2012    Procedure: REMOVAL OF ARTERIOVENOUS GORETEX GRAFT ([place]);  Surgeon: Mal Misty, MD;  Location: [place];  Service: Vascular;  Laterality: Right;  . Av fistula placement Right 11/06/2012    Procedure: INSERTION OF ARTERIOVENOUS (AV) GORE-TEX GRAFT ARM;  Surgeon: Mal Misty, MD;  Location: [place];  Service: Vascular;  Laterality: Right;  . Insertion of dialysis catheter Right 11/09/2012    Procedure: INSERTION OF DIALYSIS CATHETER;  Surgeon: Conrad [place], MD;  Location: [place];  Service: Vascular;  Laterality: Right;  Right  Femoral Vein  . Venogram N/A 01/06/2014    Procedure: VENOGRAM;  Surgeon: Conrad [place], MD;  Location: [facility] CATH LAB;  Service: Cardiovascular;  Laterality: N/A;  . Amputation Left 01/20/2015    Procedure: Left Great Toe Amputation;  Surgeon: Newt Minion, MD;  Location: [place];  Service: Orthopedics;  Laterality: Left;  . Amputation Left 03/07/2015    Procedure: Left Foot 1st and 2nd Ray Amputation;  Surgeon: Newt Minion, MD;  Location: [place];  Service: Orthopedics;  Laterality: Left;  . Amputation Left 04/14/2015    Procedure: LEFT BELOW THE KNEE AMPUTATION ;  Surgeon: Newt Minion, MD;  Location: [place];  Service: Orthopedics;  Laterality: Left;   Family History  Problem Relation Age of Onset  . Heart disease Mother   . Kidney disease Mother   . Hypertension Mother    Social History   Social History  . Marital Status: Single    Spouse Name: N/A  . Number of  Children: N/A  . Years of Education: N/A   Occupational History  . Not on file.   Social History Main Topics  . Smoking status: Former Smoker -- 0.10 packs/day for 20 years    Types: Cigarettes    Quit date: 01/13/2015  . Smokeless tobacco: Never Used  . Alcohol Use: No  . Drug Use: No  . Sexual Activity: Not on file  Other Topics Concern  . Not on file   Social History Narrative    Review of Systems: CONSTITUTIONAL- No change in appetite. SKIN- multiple Rash,on skin. CARDIAC- Has Palpitations, no chest pain. GI- No vomiting, diarrhoea, abd pain. Rest of ROS per HPI.  Physical Exam: Blood pressure 114/60, pulse 129, temperature 98.7 F (37.1 C), temperature source Oral, resp. rate 16, height 6' (1.829 m), weight 260 lb (117.935 kg), SpO2 100 %. GENERAL- alert, co-operative, appears as stated age, not in any distress, sleeping, but easily arousable, obese. HEENT- Atraumatic, normocephalic, PERRL, neck supple, no stiffness CARDIAC- Tachycardic, no murmurs, rubs or gallops. RESP- Moving equal volumes of air, and clear to auscultation bilaterally, no wheezes or crackles. ABDOMEN- Soft, nontender, no palpable masses or organomegaly, bowel sounds present. BACK- Normal curvature of the spine, No tenderness along the vertebrae, no CVA tenderness. NEURO- No obvious Cr N abnormality, negative kernigs and brudzinski. EXTREMITIES- left BKA, wrapped, not tender, Right- no pitting pedal edema, Dp pulse difficult to appreciate, with deformity due to prior surgery, , HD access left upper extremity- with multiple healing ulcers on surface, no bruit appreciated, minimally tender. SKIN- Warm, dry, multiple lesions on his extremities, abdomen, hyperpigmented, with some healing wounds- ~2 by ~2 cm, dry, no drainage- pt says its all related to his HD.  PSYCH- Normal mood and affect, appropriate thought content and speech.  Lab results: Basic Metabolic Panel:  Recent Labs  08/11/15 0928  NA  132*  K 4.1  CL 86*  CO2 25  GLUCOSE 410*  BUN 49*  CREATININE 12.61*  CALCIUM 8.3*   Liver Function Tests:  Recent Labs  08/11/15 0928  AST 19  ALT 15*  ALKPHOS 72  BILITOT 1.5*  PROT 7.6  ALBUMIN 3.0*   CBC:  Recent Labs  08/11/15 0928  WBC 20.2*  NEUTROABS 17.8*  HGB 10.2*  HCT 32.6*  MCV 94.8  PLT 71*   Imaging results:  Dg Chest 2 View  08/11/2015  CLINICAL DATA:  Fever, shortness of breath for 3 days 10/21/2012 EXAM: CHEST  2 VIEW COMPARISON:  None. FINDINGS: Cardiomediastinal silhouette is stable. No acute infiltrate or pleural effusion. No pulmonary edema. There is a left subclavian central catheter with tip in right atrium. Bony thorax is unremarkable. IMPRESSION: No active cardiopulmonary disease. Left subclavian central catheter with tip in right atrium. Electronically Signed   By: Lahoma Crocker M.D.   On: 08/11/2015 09:46   Other results: EKG: Tachycardic- 138bpm, regular, sinus, normal intervals, QRS- 83, PR- 166, but Prolonged Qtc- 529. No St abnormalities, T waves with p waves, likely due to sinus tachycardia.  Assessment & Plan by Problem: Principal Problem:   Sepsis ([place]) Active Problems:   DIASTOLIC DYSFUNCTION   PVD   ESRD on dialysis ([place])   S/P BKA (below knee amputation) unilateral ([facility])   Diabetes mellitus type 2, uncontrolled ([place])   Fever  Sepsis- With fever- 101.4, hypotension- lowest on admission- 95/54, tachycardia- and elevated WBC- 20.2 in this pt with ESRD on HD.  Etiology likely bacteremia, Source possibly from HD assess site- left upper extremity. Site doe snot appear infected, but has no bruit. Also consider abdominal source- as pt has loose stools. Bp has improved- after 2L bolus given in the ED. - Admit to tele - Cont N/s At 50cc/hr - Consult Vascular surg - Consult nephrology - Monitor for fluid overload - Stool for C. Diff - Check for the Flu - Follow up blood cultures - Cont Vanc and  zosyn per pharm - f/u lactic acid -  CBc Am  Prolonged QTc- 529 - Avoid Qtc prolonging meds.  Diabetes- Blood sugars- Elevated on Bmet- 410. On longacting insulin home- ? name, and short acting insulin-?name. He says he is on a sliding scale for both meds. - Levemir 15u daily at night, charts say- 20u daily. - CBG check now, and then give novolog per sliding scale - SSI  ESRD on HD- MWF. [facility]. - nephrology Consult.  Dispo: Disposition is deferred at this time, awaiting improvement of current medical problems.   Signed: Bethena Roys, MD 08/11/2015, 11:56 AM

## 2015-08-11 NOTE — ED Notes (Signed)
Pt. Refused rectal temperature

## 2015-08-11 NOTE — ED Notes (Signed)
Emogene Morgan, RN the pt.s status.

## 2015-08-11 NOTE — Procedures (Signed)
Successful LT IJ TEMP TRIALYSIS CATH TIP SCV/RA NO COMP STABLE READY FOR USE FULL REPORT IN PACS

## 2015-08-11 NOTE — Consult Note (Signed)
Hospital Consult    Reason for Consult:  Clotted and possibly infected LUA AVG Referring Physician:  ER MRN #:  [id number]  History of Present Illness: This is a 50 y.o. male who is ESRD and dialyzes M/W/F and has had multiple HD accesses.  He has been dialyzing via his LUA AVG without difficulty up until 2 days ago.  On Wednesday, he developed a fever and chills.  It was reported that the pt had pain in his left arm, but when I spoke with him, he denies any pain in his left arm.  He states that it got "hard" over the area they were sticking him.   He states that Dr. Lucky Cowboy is his vascular surgeon.  He denies any SOB, cough or flu like symptoms.   RN reports he has had ~ 2L of fluid in the ER for sepsis.   At his last visit with VVS in June 2015, there was possibility of placement of HERO graft or thigh placement after having a venogram with the following findings: FINDING(S): 1. Occluded named veins in right arm with non-filling of subclavian vein and left innominate vein 2. Right arm drains via chest collateral veins 3. Multiple occluded stents including an occluded left subclavian vein stent 4. Left brachial vein, axillary, and subclavian vein patent  5. Suggestion that left innominate vein is patent  He has not been seen by VVS since then and states that Dr. Lucky Cowboy placed the LUA AVG.  He also has hx of LUA AVG by Dr. Oneida Alar July 02, 2011.  The pt does have a hx of having a left  And right femoral dialysis catheters.    He does have recent hx of left BKA for infected toe ~ 3 months ago by Dr. Sharol Given.  He states he is not yet walking with a prosthesis.  He does have a Charcot foot on the right side.  Past Medical History  Diagnosis Date  . Hypertension   . Chest pain   . Morbid obesity ([place])   . Bronchitis   . Blood transfusion   . H/O hiatal hernia   . Chronic kidney disease     d[facility], [facility]  . GERD (gastroesophageal reflux disease)   . Dysrhythmia     irregular  heartbeat  . Hyperparathyroidism ([place])   . ESRD (end stage renal disease) on dialysis ([place])     first HD 06/19/04  . Pneumonia   . Diabetes mellitus     type 2  . Anemia     Past Surgical History  Procedure Laterality Date  . Av fistula placement  10/11/2010  . Ankle fusion      rt foot charcot joint 2008 with refusion  . Av fistula placement  07/02/2011    Procedure: INSERTION OF ARTERIOVENOUS (AV) GORE-TEX GRAFT ARM;  Surgeon: Elam Dutch, MD;  Location: [facility];  Service: Vascular;  Laterality: Left;  . Thrombectomy    . Insertion of dialysis catheter  01/20/2012    Procedure: INSERTION OF DIALYSIS CATHETER;  Surgeon: Serafina Mitchell, MD;  Location: [place];  Service: Vascular;  Laterality: N/A;  insertion left internal jugular dialysis catheter 23cm  . Umbilcial hernia    . Parathyroidectomy    . Avgg removal Right 11/06/2012    Procedure: REMOVAL OF ARTERIOVENOUS GORETEX GRAFT ([place]);  Surgeon: Mal Misty, MD;  Location: [place];  Service: Vascular;  Laterality: Right;  . Av fistula placement Right 11/06/2012    Procedure: INSERTION  OF ARTERIOVENOUS (AV) GORE-TEX GRAFT ARM;  Surgeon: Mal Misty, MD;  Location: [place];  Service: Vascular;  Laterality: Right;  . Insertion of dialysis catheter Right 11/09/2012    Procedure: INSERTION OF DIALYSIS CATHETER;  Surgeon: Conrad [place]idan, MD;  Location: [place];  Service: Vascular;  Laterality: Right;  Right  Femoral Vein  . Venogram N/A 01/06/2014    Procedure: VENOGRAM;  Surgeon: Conrad [place], MD;  Location: [facility] CATH LAB;  Service: Cardiovascular;  Laterality: N/A;  . Amputation Left 01/20/2015    Procedure: Left Great Toe Amputation;  Surgeon: Newt Minion, MD;  Location: [place];  Service: Orthopedics;  Laterality: Left;  . Amputation Left 03/07/2015    Procedure: Left Foot 1st and 2nd Ray Amputation;  Surgeon: Newt Minion, MD;  Location: [place];  Service: Orthopedics;  Laterality: Left;  . Amputation Left 04/14/2015    Procedure: LEFT BELOW  THE KNEE AMPUTATION ;  Surgeon: Newt Minion, MD;  Location: [place];  Service: Orthopedics;  Laterality: Left;    No Known Allergies  Prior to Admission medications   Medication Sig Start Date End Date Taking? Authorizing Provider  aspirin EC 325 MG EC tablet Take 1 tablet (325 mg total) by mouth daily. 04/26/15  Yes Daniel J Angiulli, PA-C  b complex-vitamin c-folic acid (NEPHRO-VITE) 0.8 MG TABS tablet Take 1 tablet by mouth at bedtime. 04/26/15  Yes Daniel J Angiulli, PA-C  calcitRIOL (ROCALTROL) 0.5 MCG capsule Take 5 capsules (2.5 mcg total) by mouth every Monday, Wednesday, and Friday with hemodialysis. 04/26/15  Yes Daniel J Angiulli, PA-C  calcium carbonate (TUMS - DOSED IN MG ELEMENTAL CALCIUM) 500 MG chewable tablet Chew 4 tablets (800 mg of elemental calcium total) by mouth 3 (three) times daily with meals. 04/26/15  Yes Daniel J Angiulli, PA-C  famotidine (PEPCID) 20 MG tablet Take 1 tablet (20 mg total) by mouth at bedtime. 04/26/15  Yes Daniel J Angiulli, PA-C  insulin detemir (LEVEMIR) 100 UNIT/ML injection Inject 0.2 mLs (20 Units total) into the skin at bedtime. 04/26/15  Yes Daniel J Angiulli, PA-C  methocarbamol (ROBAXIN) 500 MG tablet Take 1 tablet (500 mg total) by mouth every 6 (six) hours as needed for muscle spasms. 04/26/15  Yes Daniel J Angiulli, PA-C  oxyCODONE (OXY IR/ROXICODONE) 5 MG immediate release tablet Take 1-2 tablets (5-10 mg total) by mouth every 3 (three) hours as needed for breakthrough pain. 04/26/15  Yes Daniel J Angiulli, PA-C  pantoprazole (PROTONIX) 40 MG tablet Take 1 tablet (40 mg total) by mouth 2 (two) times daily. 04/26/15  Yes Cathlyn Parsons, PA-C    Social History   Social History  . Marital Status: Single    Spouse Name: N/A  . Number of Children: N/A  . Years of Education: N/A   Occupational History  . Not on file.   Social History Main Topics  . Smoking status: Former Smoker -- 0.10 packs/day for 20 years    Types: Cigarettes     Quit date: 01/13/2015  . Smokeless tobacco: Never Used  . Alcohol Use: No  . Drug Use: No  . Sexual Activity: Not on file   Other Topics Concern  . Not on file   Social History Narrative     Family History  Problem Relation Age of Onset  . Heart disease Mother   . Kidney disease Mother   . Hypertension Mother     ROS: [x]  Positive   [ ]  Negative   [ ]   All sytems reviewed and are negative  Cardiovascular: []  chest pain/pressure []  palpitations []  SOB lying flat []  DOE []  pain in legs while walking []  pain in legs at rest []  pain in legs at night []  non-healing ulcers []  hx of DVT []  swelling in legs  Pulmonary: []  productive cough []  asthma/wheezing []  home O2  Neurologic: []  weakness in []  arms []  legs []  numbness in []  arms []  legs []  hx of CVA []  mini stroke [] difficulty speaking or slurred speech []  temporary loss of vision in one eye []  dizziness  Hematologic: []  hx of cancer []  bleeding problems []  problems with blood clotting easily [x]  anemia due to CKD  Endocrine:   [x]  diabetes []  thyroid disease  GI []  vomiting blood []  blood in stool  GU: [x]  CKD/renal failure [x]  HD--[x]  M/W/F or []  T/T/S []  burning with urination []  blood in urine  Psychiatric: []  anxiety []  depression  Musculoskeletal: []  arthritis []  joint pain  Integumentary: []  rashes []  ulcers  Constitutional: []  fever []  chills   Physical Examination  Filed Vitals:   08/11/15 1414 08/11/15 1415  BP: 89/55 89/55  Pulse: 125 122  Temp:    Resp: 23 22   Body mass index is 35.25 kg/(m^2).  General:  WDWN in NAD Gait: Not observed HENT: WNL, normocephalic Pulmonary: normal non-labored breathing, without Rales, rhonchi,  wheezing Cardiac: regular/tachy, without  Murmurs, rubs or gallops Abdomen:  soft, NT/ND, no masses; well healed peri umbilical scar (hx of hernia repair) Skin: without rashes Vascular Exam/Pulses:  Right Left  Radial Unable to palpate   Unable to palpate   Femoral 2+ (normal) 2+ (normal)  DP Unable to palpate  BKA  PT Unable to palpate  BKA   Extremities: without ischemic changes, without Gangrene , without cellulitis; without open wounds; left arm graft does not have a thrill or bruit; multiple healed incisions BUE Musculoskeletal: no muscle wasting or atrophy; Charcot foot - right  Neurologic: A&O X 3;  Speech is fluent/normal Psychiatric:  Normal affect Lymph:  No inguinal lymphadenopathy   CBC    Component Value Date/Time   WBC 20.2* 08/11/2015 0928   WBC 8.1 02/10/2014 1319   RBC 3.44* 08/11/2015 0928   RBC 3.86* 02/10/2014 1319   HGB 10.2* 08/11/2015 0928   HGB 11.4* 02/10/2014 1319   HCT 32.6* 08/11/2015 0928   HCT 36.3* 02/10/2014 1319   PLT 71* 08/11/2015 0928   PLT 140* 02/10/2014 1319   MCV 94.8 08/11/2015 0928   MCV 94 02/10/2014 1319   MCH 29.7 08/11/2015 0928   MCH 29.4 02/10/2014 1319   MCHC 31.3 08/11/2015 0928   MCHC 31.3* 02/10/2014 1319   RDW 17.4* 08/11/2015 0928   RDW 16.7* 02/10/2014 1319   LYMPHSABS 0.8 08/11/2015 0928   LYMPHSABS 1.8 02/10/2014 1319   MONOABS 1.5* 08/11/2015 0928   MONOABS 0.8 02/10/2014 1319   EOSABS 0.0 08/11/2015 0928   EOSABS 0.6 02/10/2014 1319   BASOSABS 0.0 08/11/2015 0928   BASOSABS 0.1 02/10/2014 1319    BMET    Component Value Date/Time   NA 132* 08/11/2015 0928   NA 135* 02/10/2014 1319   K 4.1 08/11/2015 0928   K 4.6 02/17/2014 1353   CL 86* 08/11/2015 0928   CL 99 02/10/2014 1319   CO2 25 08/11/2015 0928   CO2 25 02/10/2014 1319   GLUCOSE 410* 08/11/2015 0928   GLUCOSE 205* 02/10/2014 1319   BUN 49* 08/11/2015 0928   BUN 40* 02/10/2014  1319   CREATININE 12.61* 08/11/2015 0928   CREATININE 11.58* 02/10/2014 1319   CALCIUM 8.3* 08/11/2015 0928   CALCIUM 8.4* 02/10/2014 1319   GFRNONAA 4* 08/11/2015 0928   GFRNONAA 5* 02/10/2014 1319   GFRAA 5* 08/11/2015 0928   GFRAA 5* 02/10/2014 1319    COAGS: Lab Results  Component Value  Date   INR 1.16 04/14/2015   INR 1.15 03/07/2015   INR 1.15 01/20/2015     Non-Invasive Vascular Imaging:   None at this time.  Venogram June 2015 by Dr. Bridgett Larsson:  FINDING(S): FINDING(S): -Occluded named veins in right arm with non-filling of subclavian vein and left innominate vein -Right arm drains via chest collateral veins -Multiple occluded stents including an occluded left subclavian vein stent -Left brachial vein, axillary, and subclavian vein patent  -Suggestion that left innominate vein is patent  Based on the images, the patient may be a candidate for a hybrid AVG vs HeRO graft-catheter placement.  Statin:  No. Beta Blocker:  No. Aspirin:  Yes.   ACEI:  No. ARB:  No. Other antiplatelets/anticoagulants:  No.    ASSESSMENT/PLAN: This is a 50 y.o. male with ESRD now with clotted LUA AVG and fever of 101.3 and possible sepsis   -left arm does not appear infected or the source of his sepsis. -he was using his LUA AVG up until this past Wednesday without difficulty and given that he doesn't have many options for access, it may be warranted to pursue thrombolysis.  May need referral to IR for thrombolysis -he has received IVF, Vanc and Zosyn in the ER -the pt is npo for possible intervention. -Dr. Donnetta Hutching will be in to see the pt and further recommendations -he has been on coumadin in the past, but he states he is no longer on any blood thinners.   Leontine Locket, PA-C [facility] [phone]  I have examined the patient, reviewed and agree with above. The patient has a Hero graft that was placed in [place] therefore prior treatment is available. Patient reports that he had successful access as usual on Wednesday. He denies any pain. Had not had fevers prior to admission. On physical exam the graft portion of his hero is completely benign. There is no erythema and no tenderness and no fluctuance over the entire length of this.  I reviewed his chest  x-ray which shows good placement of the tips of the hero in the right atrium. Discussed this with patient explaining the that the treatment for thrombosed hero is attempted lysis and interventional radiology. Impossible to know whether this is the source of his fever but would recommend anabiotic cysts being done and expectant management of his femur graft. If this cannot be opened will need hemodialysis catheter and planning for next best option for long-term access. Discussed by telephone with renal service  Curt Jews, MD 08/11/2015 4:03 PM

## 2015-08-11 NOTE — ED Notes (Addendum)
Pt. Was at the Vascular lab due to having a clotted Stenosed Dialysis Device. Pt was going to have a thrombectomy and Angiogram.  Upon assessment of pt.   Pt's heart rate is 152 and temp.2as 101.4 tympanic.  Pt. Denies any pain or discomfort.  Is alert and oriented X 4.  Pt. Has dialysis on Monday Wednesday and Friday.  Pt. Was due today to have dialysis.  Pt. Also reports that he has had n/v/d for 2 days.

## 2015-08-11 NOTE — ED Notes (Signed)
Dr. Marrion Coy assistant was at the bedside.  Pt. To be NPO until we hear from Dr. Tawni Millers.   Pt. Is aware, and last timed pt. Ate was yesterday.

## 2015-08-11 NOTE — ED Notes (Addendum)
Pt. s Blood pressure cuff fell down his arm.  Rechecked his BP 114/78

## 2015-08-11 NOTE — ED Notes (Signed)
Unable to collect urine. Pt. Stated, "I do not make urine."

## 2015-08-11 NOTE — ED Notes (Signed)
Dr. Johnney Killian aware of having only 1 IV Access

## 2015-08-11 NOTE — Progress Notes (Signed)
Pharmacy Code Sepsis Protocol  Time of code sepsis page: 0929 [x]  Antibiotics delivered at [phone]  Were antibiotics ordered at the time of the code sepsis page? Yes Was it required to contact the physician? [x]  Physician not contacted []  Physician contacted to order antibiotics for code sepsis []  Physician contacted to recommend changing antibiotics  Pharmacy consulted for: Zosyn and vancomycin  Anti-infectives    Start     Dose/Rate Route Frequency Ordered Stop   08/11/15 1000  vancomycin (VANCOCIN) 2,500 mg in sodium chloride 0.9 % 500 mL IVPB     2,500 mg 250 mL/hr over 120 Minutes Intravenous  Once 08/11/15 0931     08/11/15 1000  piperacillin-tazobactam (ZOSYN) IVPB 3.375 g     3.375 g 100 mL/hr over 30 Minutes Intravenous  Once 08/11/15 0931          Nurse education provided: [x]  Minutes left to administer antibiotics to achieve 1 hour goal [x]  Correct order of antibiotic administration [x]  Antibiotic Y-site compatibilities    Elenor Quinones, PharmD, BCPS Clinical Pharmacist Pager [phone] 08/11/2015 9:37 AM

## 2015-08-11 NOTE — Progress Notes (Signed)
ANTIBIOTIC CONSULT NOTE  Pharmacy Consult for Zosyn and vancomycin Indication: Sepsis  No Known Allergies  Patient Measurements: Height: 6' (182.9 cm) Weight: 260 lb (117.935 kg) IBW/kg (Calculated) : 77.6  Vital Signs: Temp: 98.7 F (37.1 C) (01/27 1122) Temp Source: Oral (01/27 1122) BP: 83/66 mmHg (01/27 1545) Pulse Rate: 118 (01/27 1545) Intake/Output from previous day:   Intake/Output from this shift: Total I/O In: 2000 [I.V.:2000] Out: -   Labs:  Recent Labs  08/11/15 0928  WBC 20.2*  HGB 10.2*  PLT 71*  CREATININE 12.61*   Estimated Creatinine Clearance: 9.2 mL/min (by C-G formula based on Cr of 12.61). No results for input(s): VANCOTROUGH, VANCOPEAK, VANCORANDOM, GENTTROUGH, GENTPEAK, GENTRANDOM, TOBRATROUGH, TOBRAPEAK, TOBRARND, AMIKACINPEAK, AMIKACINTROU, AMIKACIN in the last 72 hours.   Microbiology: No results found for this or any previous visit (from the past 720 hour(s)).  Assessment: 50 yo M presents on 1/27 with tachycardia and fever. Found to be a code sepsis. Pharmacy consulted for abx. Tmax up to 101.3 and labs pending. PMH includes ESRD with HD on MWF. Was due to have dialysis today.  HD planned for Sat 1/28  Goal of Therapy:  Vancomycin trough level 15-20 mcg/ml  Resolution of infection  Plan:  Continue Zosyn 2.25 gm IV q8h Give vancomycin 1 gm with each HD session (will schedule x 1 on Sat then with regular schedule of MWF) Monitor clinical picture, renal function, VT prn F/U C&S, abx deescalation / LOT F/U changes in HD schedule  May need another vancomycin dose later today if goes to HD  Levester Fresh, PharmD, BCPS, [facility] Clinical Pharmacist Pager [phone] 08/11/2015 4:23 PM

## 2015-08-12 ENCOUNTER — Inpatient Hospital Stay (HOSPITAL_COMMUNITY): Payer: Medicare Other

## 2015-08-12 DIAGNOSIS — R6520 Severe sepsis without septic shock: Secondary | ICD-10-CM | POA: Insufficient documentation

## 2015-08-12 DIAGNOSIS — I959 Hypotension, unspecified: Secondary | ICD-10-CM

## 2015-08-12 DIAGNOSIS — R197 Diarrhea, unspecified: Secondary | ICD-10-CM

## 2015-08-12 DIAGNOSIS — A419 Sepsis, unspecified organism: Secondary | ICD-10-CM | POA: Insufficient documentation

## 2015-08-12 LAB — COMPREHENSIVE METABOLIC PANEL
ALBUMIN: 2.4 g/dL — AB (ref 3.5–5.0)
ALT: 14 U/L — ABNORMAL LOW (ref 17–63)
AST: 17 U/L (ref 15–41)
Alkaline Phosphatase: 69 U/L (ref 38–126)
Anion gap: 16 — ABNORMAL HIGH (ref 5–15)
BUN: 58 mg/dL — AB (ref 6–20)
CHLORIDE: 93 mmol/L — AB (ref 101–111)
CO2: 25 mmol/L (ref 22–32)
CREATININE: 13.35 mg/dL — AB (ref 0.61–1.24)
Calcium: 8 mg/dL — ABNORMAL LOW (ref 8.9–10.3)
GFR calc Af Amer: 4 mL/min — ABNORMAL LOW (ref >60)
GFR calc non Af Amer: 4 mL/min — ABNORMAL LOW (ref >60)
GLUCOSE: 130 mg/dL — AB (ref 65–99)
POTASSIUM: 4.1 mmol/L (ref 3.5–5.1)
SODIUM: 134 mmol/L — AB (ref 135–145)
Total Bilirubin: 1.4 mg/dL — ABNORMAL HIGH (ref 0.3–1.2)
Total Protein: 6.8 g/dL (ref 6.5–8.1)

## 2015-08-12 LAB — CBC
HEMATOCRIT: 28.6 % — AB (ref 39.0–52.0)
Hemoglobin: 8.9 g/dL — ABNORMAL LOW (ref 13.0–17.0)
MCH: 29.6 pg (ref 26.0–34.0)
MCHC: 31.1 g/dL (ref 30.0–36.0)
MCV: 95 fL (ref 78.0–100.0)
PLATELETS: 69 10*3/uL — AB (ref 150–400)
RBC: 3.01 MIL/uL — ABNORMAL LOW (ref 4.22–5.81)
RDW: 17.3 % — AB (ref 11.5–15.5)
WBC: 13.1 10*3/uL — AB (ref 4.0–10.5)

## 2015-08-12 LAB — GLUCOSE, CAPILLARY
GLUCOSE-CAPILLARY: 101 mg/dL — AB (ref 65–99)
Glucose-Capillary: 143 mg/dL — ABNORMAL HIGH (ref 65–99)
Glucose-Capillary: 119 mg/dL — ABNORMAL HIGH (ref 65–99)

## 2015-08-12 LAB — MRSA PCR SCREENING: MRSA by PCR: POSITIVE — AB

## 2015-08-12 LAB — PHOSPHORUS: Phosphorus: 7.2 mg/dL — ABNORMAL HIGH (ref 2.5–4.6)

## 2015-08-12 LAB — HEPATITIS B SURFACE ANTIGEN: Hepatitis B Surface Ag: NEGATIVE

## 2015-08-12 MED ORDER — OXYCODONE HCL 5 MG PO TABS
5.0000 mg | ORAL_TABLET | Freq: Once | ORAL | Status: AC
  Administered 2015-08-12: 5 mg via ORAL

## 2015-08-12 MED ORDER — LOPERAMIDE HCL 2 MG PO CAPS
2.0000 mg | ORAL_CAPSULE | ORAL | Status: DC | PRN
  Administered 2015-08-12 – 2015-08-13 (×3): 2 mg via ORAL
  Filled 2015-08-12 (×4): qty 1

## 2015-08-12 MED ORDER — OXYCODONE HCL 5 MG PO TABS
5.0000 mg | ORAL_TABLET | ORAL | Status: DC | PRN
  Administered 2015-08-12 – 2015-08-13 (×4): 5 mg via ORAL
  Filled 2015-08-12 (×4): qty 1

## 2015-08-12 NOTE — Progress Notes (Signed)
  [facility] Progress Note   Subjective: still having fevers, no resp, ENT or GI symptoms  Filed Vitals:   08/12/15 0843 08/12/15 0900 08/12/15 0930 08/12/15 0959  BP: 98/48 125/54 128/82 108/48  Pulse: 112 114 112 111  Temp:      TempSrc:      Resp: 15     Height:      Weight:      SpO2:        Inpatient medications: . aspirin EC  81 mg Oral Daily  . [START ON 08/14/2015] calcitRIOL  0.5 mcg Oral Q M,W,F-HD  . calcitRIOL  0.5 mcg Oral Once  . calcium carbonate  4 tablet Oral TID WC  . famotidine  20 mg Oral QHS  . heparin  5,000 Units Subcutaneous 3 times per day  . insulin aspart  0-9 Units Subcutaneous TID WC  . insulin detemir  15 Units Subcutaneous QHS  . multivitamin  1 tablet Oral QHS  . pantoprazole  40 mg Oral BID  . piperacillin-tazobactam (ZOSYN)  IV  2.25 g Intravenous 3 times per day  . sodium chloride flush  3 mL Intravenous Q12H  . vancomycin  1,000 mg Intravenous Q T,Th,Sa-HD  . [START ON 08/14/2015] vancomycin  1,000 mg Intravenous Q M,W,F-HD   . sodium chloride Stopped (08/12/15 1003)   acetaminophen, loperamide  Exam: Alert, no distress on HD No jvd Chest clear bilat RRR no mrg Abd soft obese ntnd no ascites Ext R Charcot foot, L BKA trace LE edema LUA HeRO access no bruit/ thrill, no wounds/ drainage Neuro alert nf ox 3  CXR 1/27 no acute   Dialysis: [facility]  4.5h  110.5kg   2/2 bath  Heparin 4500/ 1800 midRx  LUE HeRO Calc 0.5 ug tiw Mircera 50 q2, next 2/1  Venofer 50/wk Hb 11.2  Ca/P - 8/10.5 pth 529  Assessment: 1 Fevers - unclear source, no focal symptoms. BCx's pend, CXR neg, on IV abx 2 Clotted L HeRO access - will discuss options w VVS 3 ESRD MWF HD 4 Chron hypotens takes midodrine pre HD only 5 Vol up 5kg 6 Anemia Hb 10.2, esa due next wed 7 MBD cont meds 8 CM2 9 PAD L BKA  Plan - HD today using temp cath, max UF as tolerated. Abx.    Kelly Splinter MD [facility] pager [phone]    cell  [phone] 08/12/2015, 10:05 AM    Recent Labs Lab 08/11/15 0928 08/12/15 0331  NA 132* 134*  K 4.1 4.1  CL 86* 93*  CO2 25 25  GLUCOSE 410* 130*  BUN 49* 58*  CREATININE 12.61* 13.35*  CALCIUM 8.3* 8.0*  PHOS  --  7.2*    Recent Labs Lab 08/11/15 0928 08/12/15 0331  AST 19 17  ALT 15* 14*  ALKPHOS 72 69  BILITOT 1.5* 1.4*  PROT 7.6 6.8  ALBUMIN 3.0* 2.4*    Recent Labs Lab 08/11/15 0928 08/12/15 0331  WBC 20.2* 13.1*  NEUTROABS 17.8*  --   HGB 10.2* 8.9*  HCT 32.6* 28.6*  MCV 94.8 95.0  PLT 71* 69*

## 2015-08-12 NOTE — Progress Notes (Signed)
Patient ID: [name], male   DOB: 1966/01/13, 50 y.o.   MRN: [id number] Patient currently on hemodialysis via temporary catheter. No change in physical exam on his left upper arm hero graft. No erythema no fluctuance and no discomfort. Options are lysis an attempted salvage of left arm hero versus complete removal and new access. Neurology following and making recommendations

## 2015-08-12 NOTE — Progress Notes (Signed)
Hemodialysis- Saline gtt discontinued per Dr. Jonnie Finner

## 2015-08-12 NOTE — Progress Notes (Signed)
Patient transferred to dialysis via bed . VSS A&OX4 no c/o noted or voiced.

## 2015-08-12 NOTE — Progress Notes (Signed)
While turning pt during pt care patients IV became disconnected. Blood noted on sheets and blanket <10cc. Iv clamped and pulled d/t dislodgement.

## 2015-08-12 NOTE — Progress Notes (Signed)
Subjective:  Patient seen and examined at bedside. No acute events overnight. Pt does not have any femoral site cath. He denies f/c.   Objective: Vital signs in last 24 hours: Filed Vitals:   08/12/15 0843 08/12/15 0900 08/12/15 0930 08/12/15 0959  BP: 98/48 125/54 128/82 108/48  Pulse: 112 114 112 111  Temp:      TempSrc:      Resp: 15     Height:      Weight:      SpO2:       Weight change:   Intake/Output Summary (Last 24 hours) at 08/12/15 1021 Last data filed at 08/12/15 [id number]  Gross per 24 hour  Intake 2552.5 ml  Output      0 ml  Net 2552.5 ml   General: Vital signs reviewed. Patient in no acute distress Cardiovascular: tachycardic to 110, regular  rhythm, no murmur appreciated  Pulmonary/Chest: Clear to auscultation bilaterally, no wheezes, rales, or rhonchi. Abdominal: Soft, non-tender, non-distended, BS + Extremities: No lower extremity edema bilaterally,                    Pt with left BKA , nontender no discharge,   Right pulses intact              Has temp HD access on lefty sternum           Has prior healing ulcers around prior hd sites           No femoral cath     Lab Results: Results for orders placed or performed during the hospital encounter of 08/11/15 (from the past 24 hour(s))  CBG monitoring, ED     Status: Abnormal   Collection Time: 08/11/15 12:51 PM  Result Value Ref Range   Glucose-Capillary 337 (H) 65 - 99 mg/dL  Influenza panel by PCR (type A & B, H1N1)     Status: None   Collection Time: 08/11/15  1:02 PM  Result Value Ref Range   Influenza A By PCR NEGATIVE NEGATIVE   Influenza B By PCR NEGATIVE NEGATIVE   H1N1 flu by pcr NOT DETECTED NOT DETECTED  I-Stat CG4 Lactic Acid, ED  (not at  [facility])     Status: None   Collection Time: 08/11/15  1:32 PM  Result Value Ref Range   Lactic Acid, Venous 1.44 0.5 - 2.0 mmol/L  CBG monitoring, ED     Status: Abnormal   Collection Time: 08/11/15  3:30 PM  Result Value Ref Range   Glucose-Capillary 255 (H) 65 - 99 mg/dL  CBG monitoring, ED     Status: Abnormal   Collection Time: 08/11/15  5:21 PM  Result Value Ref Range   Glucose-Capillary 210 (H) 65 - 99 mg/dL  C difficile quick scan w PCR reflex     Status: None   Collection Time: 08/11/15  7:07 PM  Result Value Ref Range   C Diff antigen NEGATIVE NEGATIVE   C Diff toxin NEGATIVE NEGATIVE   C Diff interpretation Negative for toxigenic C. difficile   Glucose, capillary     Status: Abnormal   Collection Time: 08/11/15  9:09 PM  Result Value Ref Range   Glucose-Capillary 145 (H) 65 - 99 mg/dL  MRSA PCR Screening     Status: Abnormal   Collection Time: 08/12/15  2:34 AM  Result Value Ref Range   MRSA by PCR POSITIVE (A) NEGATIVE  Comprehensive metabolic panel     Status: Abnormal  Collection Time: 08/12/15  3:31 AM  Result Value Ref Range   Sodium 134 (L) 135 - 145 mmol/L   Potassium 4.1 3.5 - 5.1 mmol/L   Chloride 93 (L) 101 - 111 mmol/L   CO2 25 22 - 32 mmol/L   Glucose, Bld 130 (H) 65 - 99 mg/dL   BUN 58 (H) 6 - 20 mg/dL   Creatinine, Ser 13.35 (H) 0.61 - 1.24 mg/dL   Calcium 8.0 (L) 8.9 - 10.3 mg/dL   Total Protein 6.8 6.5 - 8.1 g/dL   Albumin 2.4 (L) 3.5 - 5.0 g/dL   AST 17 15 - 41 U/L   ALT 14 (L) 17 - 63 U/L   Alkaline Phosphatase 69 38 - 126 U/L   Total Bilirubin 1.4 (H) 0.3 - 1.2 mg/dL   GFR calc non Af Amer 4 (L) >60 mL/min   GFR calc Af Amer 4 (L) >60 mL/min   Anion gap 16 (H) 5 - 15  CBC     Status: Abnormal   Collection Time: 08/12/15  3:31 AM  Result Value Ref Range   WBC 13.1 (H) 4.0 - 10.5 K/uL   RBC 3.01 (L) 4.22 - 5.81 MIL/uL   Hemoglobin 8.9 (L) 13.0 - 17.0 g/dL   HCT 28.6 (L) 39.0 - 52.0 %   MCV 95.0 78.0 - 100.0 fL   MCH 29.6 26.0 - 34.0 pg   MCHC 31.1 30.0 - 36.0 g/dL   RDW 17.3 (H) 11.5 - 15.5 %   Platelets 69 (L) 150 - 400 K/uL  Phosphorus     Status: Abnormal   Collection Time: 08/12/15  3:31 AM  Result Value Ref Range   Phosphorus 7.2 (H) 2.5 - 4.6 mg/dL  Glucose,  capillary     Status: Abnormal   Collection Time: 08/12/15  7:23 AM  Result Value Ref Range   Glucose-Capillary 101 (H) 65 - 99 mg/dL      Micro Results: Recent Results (from the past 240 hour(s))  C difficile quick scan w PCR reflex     Status: None   Collection Time: 08/11/15  7:07 PM  Result Value Ref Range Status   C Diff antigen NEGATIVE NEGATIVE Final   C Diff toxin NEGATIVE NEGATIVE Final   C Diff interpretation Negative for toxigenic C. difficile  Final  MRSA PCR Screening     Status: Abnormal   Collection Time: 08/12/15  2:34 AM  Result Value Ref Range Status   MRSA by PCR POSITIVE (A) NEGATIVE Final    Comment:        The GeneXpert MRSA Assay (FDA approved for NASAL specimens only), is one component of a comprehensive MRSA colonization surveillance program. It is not intended to diagnose MRSA infection nor to guide or monitor treatment for MRSA infections. RESULT CALLED TO, READ BACK BY AND VERIFIED WITH: [place] @0639  08/13/15 MKELLY    Studies/Results: Dg Chest 2 View  08/12/2015  CLINICAL DATA:  Fever EXAM: CHEST  2 VIEW COMPARISON:  08/11/2015 FINDINGS: There is been interval placement of a dialysis catheter via the left jugular approach. A hero graft is again identified. Cardiac shadow is accentuated by the portable technique. Lungs are well aerated bilaterally. Slight increased density is noted in the right upper lobe which may represent some early infiltrate. No bony abnormality is seen. IMPRESSION: Changes consistent with end-stage renal disease and dialysis. Early right upper lobe infiltrate. Followup examination is recommended. Electronically Signed   By: Linus Mako.D.  On: 08/12/2015 07:42   Dg Chest 2 View  08/11/2015  CLINICAL DATA:  Fever, shortness of breath for 3 days 10/21/2012 EXAM: CHEST  2 VIEW COMPARISON:  None. FINDINGS: Cardiomediastinal silhouette is stable. No acute infiltrate or pleural effusion. No pulmonary edema.  There is a left subclavian central catheter with tip in right atrium. Bony thorax is unremarkable. IMPRESSION: No active cardiopulmonary disease. Left subclavian central catheter with tip in right atrium. Electronically Signed   By: Lahoma Crocker M.D.   On: 08/11/2015 09:46   Ir Fluoro Guide Cv Line Left  08/12/2015  INDICATION: End-stage renal disease, occluded left upper extremity hero access, no current access, fever, possible infected access site. EXAM: IR LEFT FLOURO GUIDE CV LINE; IR ULTRASOUND GUIDANCE VASC ACCESS LEFT MEDICATIONS: Already given in the emergency room. ANESTHESIA/SEDATION: None. FLUOROSCOPY TIME:  Fluoroscopy Time: 4 minutes 36 seconds (46 mGy). COMPLICATIONS: None immediate. PROCEDURE: Informed written consent was obtained from the patient after a thorough discussion of the procedural risks, benefits and alternatives. All questions were addressed. Maximal Sterile Barrier Technique was utilized including caps, mask, sterile gowns, sterile gloves, sterile drape, hand hygiene and skin antiseptic. A timeout was performed prior to the initiation of the procedure. Initially, under sterile conditions and local anesthesia, a right external jugular vein was accessed with a micropuncture needle. This was performed with ultrasound. Guidewire would not advance centrally. 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 Next, ultrasound was performed of the left neck. A left external jugular vein was demonstrated. Also under sterile conditions and local anesthesia, ultrasound micropuncture access performed of the left external jugular vein. Guidewire would not pass central to the hero access. Contrast injection confirms central left innominate occlusion secondary to the catheter. Guidewire inserted followed by a 6 French sheath. Kumpe catheter and Glidewire were advanced across the left innominate occlusion and advanced with some difficulty into the right atrium. Contrast  injection confirms position in the right atrium. Guidewire exchanged for an Amplatz guidewire. Tract dilatation performed to insert a 24 cm temporary Trialysis catheter. Tip position in the right atrium. Contrast injection reconfirms position. Catheter secured with Prolene sutures. Blood aspirated easily followed by saline and heparin flushes. External caps applied followed by sterile dressing. No immediate complication. IMPRESSION: Successful ultrasound and fluoroscopic left external jugular temporary Trialysis catheter. Tip proximal right atrium. Access ready for use. Right central innominate chronic venous occlusion. Electronically Signed   By: Jerilynn Mages.  Shick M.D.   On: 08/12/2015 08:49   Ir US Guide Vasc Access Left  08/12/2015  INDICATION: End-stage renal disease, occluded left upper extremity hero access, no current access, fever, possible infected access site. EXAM: IR LEFT FLOURO GUIDE CV LINE; IR ULTRASOUND GUIDANCE VASC ACCESS LEFT MEDICATIONS: Already given in the emergency room. ANESTHESIA/SEDATION: None. FLUOROSCOPY TIME:  Fluoroscopy Time: 4 minutes 36 seconds (46 mGy). COMPLICATIONS: None immediate. PROCEDURE: Informed written consent was obtained from the patient after a thorough discussion of the procedural risks, benefits and alternatives. All questions were addressed. Maximal Sterile Barrier Technique was utilized including caps, mask, sterile gowns, sterile gloves, sterile drape, hand hygiene and skin antiseptic. A timeout was performed prior to the initiation of the procedure. Initially, under sterile conditions and local anesthesia, a right external jugular vein was accessed with a micropuncture needle. This was performed with ultrasound. Guidewire would not advance centrally. 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 Next, ultrasound was performed of the left neck. A left external jugular vein was demonstrated. Also under  sterile conditions and local  anesthesia, ultrasound micropuncture access performed of the left external jugular vein. Guidewire would not pass central to the hero access. Contrast injection confirms central left innominate occlusion secondary to the catheter. Guidewire inserted followed by a 6 French sheath. Kumpe catheter and Glidewire were advanced across the left innominate occlusion and advanced with some difficulty into the right atrium. Contrast injection confirms position in the right atrium. Guidewire exchanged for an Amplatz guidewire. Tract dilatation performed to insert a 24 cm temporary Trialysis catheter. Tip position in the right atrium. Contrast injection reconfirms position. Catheter secured with Prolene sutures. Blood aspirated easily followed by saline and heparin flushes. External caps applied followed by sterile dressing. No immediate complication. IMPRESSION: Successful ultrasound and fluoroscopic left external jugular temporary Trialysis catheter. Tip proximal right atrium. Access ready for use. Right central innominate chronic venous occlusion. Electronically Signed   By: Jerilynn Mages.  Shick M.D.   On: 08/12/2015 08:49   Medications: I have reviewed the patient's current medications. Scheduled Meds: . aspirin EC  81 mg Oral Daily  . [START ON 08/14/2015] calcitRIOL  0.5 mcg Oral Q M,W,F-HD  . calcitRIOL  0.5 mcg Oral Once  . calcium carbonate  4 tablet Oral TID WC  . famotidine  20 mg Oral QHS  . heparin  5,000 Units Subcutaneous 3 times per day  . insulin aspart  0-9 Units Subcutaneous TID WC  . insulin detemir  15 Units Subcutaneous QHS  . multivitamin  1 tablet Oral QHS  . pantoprazole  40 mg Oral BID  . piperacillin-tazobactam (ZOSYN)  IV  2.25 g Intravenous 3 times per day  . sodium chloride flush  3 mL Intravenous Q12H  . vancomycin  1,000 mg Intravenous Q T,Th,Sa-HD  . [START ON 08/14/2015] vancomycin  1,000 mg Intravenous Q M,W,F-HD   Continuous Infusions: . sodium chloride Stopped (08/12/15 1003)   PRN  Meds:.acetaminophen, loperamide Assessment/Plan: Principal Problem:   Sepsis ([place]) Active Problems:   DIASTOLIC DYSFUNCTION   PVD   ESRD on dialysis ([place])   S/P BKA (below knee amputation) unilateral ([facility])   Diabetes mellitus type 2, uncontrolled ([facility])   Fever  Sepsis with fevers of unknown origin- fever- 101.4, hypotension- lowest on admission- 95/54, tachycardia- and elevated WBC- 20.2 in this pt with ESRD on HD. Etiology likely bacteremia, Source possibly from HD assess site- left upper extremity. Site does not appear infected, but has no bruit. As pt had some diarrhea, we tested for cdif, which was negative.   His white count has trended down from 20 to 13. Platelets have been stable at 69. Hemoglobin has been stable. Blood cultures still pending.  -on IV vanc and zosyn  -on IV fluids 50 cc/hr,   -monitor fever curve, and trend WC  - Consult Vascular surg - Consult nephrology  Follow up: Blood cultures  Hep b surface antigen  -repeat CBC and BMP tomorrow     Diabetes- Blood sugars- Elevated on Bmet- 410 on admission.   On insulin at home. Blood sugars have been stable. He required 3 units of novolog  - Levemir 15u daily at night, -SSI  ESRD on HD- MWF. [facility] [place] [facility].  - nephrology Consult. -continue renal supplements    Dispo: Disposition is deferred at this time, awaiting improvement of current medical problems.  Anticipated discharge in approximately 2 day(s).   The patient does have a current PCP Delrae Rend, MD) and does need an [facility] hospital follow-up appointment after discharge.  The patient  does not have transportation limitations that hinder transportation to clinic appointments.  .Services Needed at time of discharge: Y = Yes, Blank = No PT:   OT:   RN:   Equipment:   Other:     LOS: 1 day   Burgess Estelle, MD 08/12/2015, 10:21 AM

## 2015-08-13 DIAGNOSIS — T827XXA Infection and inflammatory reaction due to other cardiac and vascular devices, implants and grafts, initial encounter: Secondary | ICD-10-CM | POA: Diagnosis present

## 2015-08-13 DIAGNOSIS — A412 Sepsis due to unspecified staphylococcus: Secondary | ICD-10-CM | POA: Insufficient documentation

## 2015-08-13 DIAGNOSIS — D72829 Elevated white blood cell count, unspecified: Secondary | ICD-10-CM

## 2015-08-13 LAB — CBC
HEMATOCRIT: 29.2 % — AB (ref 39.0–52.0)
Hemoglobin: 9.4 g/dL — ABNORMAL LOW (ref 13.0–17.0)
MCH: 30.3 pg (ref 26.0–34.0)
MCHC: 32.2 g/dL (ref 30.0–36.0)
MCV: 94.2 fL (ref 78.0–100.0)
Platelets: 78 10*3/uL — ABNORMAL LOW (ref 150–400)
RBC: 3.1 MIL/uL — AB (ref 4.22–5.81)
RDW: 17.4 % — ABNORMAL HIGH (ref 11.5–15.5)
WBC: 12.4 10*3/uL — AB (ref 4.0–10.5)

## 2015-08-13 LAB — BASIC METABOLIC PANEL
ANION GAP: 14 (ref 5–15)
BUN: 40 mg/dL — ABNORMAL HIGH (ref 6–20)
CHLORIDE: 95 mmol/L — AB (ref 101–111)
CO2: 23 mmol/L (ref 22–32)
Calcium: 8.1 mg/dL — ABNORMAL LOW (ref 8.9–10.3)
Creatinine, Ser: 10.12 mg/dL — ABNORMAL HIGH (ref 0.61–1.24)
GFR calc Af Amer: 6 mL/min — ABNORMAL LOW (ref >60)
GFR calc non Af Amer: 5 mL/min — ABNORMAL LOW (ref >60)
GLUCOSE: 116 mg/dL — AB (ref 65–99)
POTASSIUM: 3.8 mmol/L (ref 3.5–5.1)
Sodium: 132 mmol/L — ABNORMAL LOW (ref 135–145)

## 2015-08-13 LAB — GLUCOSE, CAPILLARY
GLUCOSE-CAPILLARY: 163 mg/dL — AB (ref 65–99)
GLUCOSE-CAPILLARY: 235 mg/dL — AB (ref 65–99)
Glucose-Capillary: 98 mg/dL (ref 65–99)
Glucose-Capillary: 135 mg/dL — ABNORMAL HIGH (ref 65–99)
Glucose-Capillary: 143 mg/dL — ABNORMAL HIGH (ref 65–99)

## 2015-08-13 MED ORDER — CALCITRIOL 0.5 MCG PO CAPS
2.5000 ug | ORAL_CAPSULE | ORAL | Status: DC
  Administered 2015-08-15 – 2015-08-16 (×2): 2.5 ug via ORAL
  Filled 2015-08-13: qty 5

## 2015-08-13 NOTE — Progress Notes (Signed)
Patient ID: [name], male   DOB: Sep 21, 1965, 50 y.o.   MRN: [id number] Medicine attending: Database reviewed. Status and management plan discussed with resident physician Dr. Julious Oka and I concur with her evaluation and management plan. We appreciate vascular surgery assistance. Persistent fevers as high as 102.7. Blood cultures are growing gram-positive organisms in clusters. Nasal swab positive for MRSA. Presumed source of infection is recently clotted vascular graft left upper extremity. Graft will be removed along with central line by vascular surgery tomorrow. Continue parenteral vancomycin.

## 2015-08-13 NOTE — Progress Notes (Signed)
Patient ID: [name], male   DOB: 1965/08/14, 50 y.o.   MRN: [id number] Contacted this morning by resident teaching service. Unclear of plans. I again explained what I had on admission friday and in my progress note yesterday that from a surgical standpoint the only option is complete removal of his HERO.  I do not see any clinical evidence of infection of this but this could certainly be the source since the catheter portion of the hero is functioning as a long-term catheter. Following along and prepared to remove the entire graft if this is deemed appropriate. Also spoke in person with nephrology service on 2 occasions regarding this. Following with you

## 2015-08-13 NOTE — Progress Notes (Signed)
Subjective: No complaints this morning. Tmax 102.13F overnight.   Objective: Vital signs in last 24 hours: Filed Vitals:   08/12/15 1247 08/12/15 2005 08/12/15 2218 08/13/15 0447  BP: 120/98 98/68  94/45  Pulse: 110 116  101  Temp: 99.8 F (37.7 C) 102.7 F (39.3 C) 98.8 F (37.1 C) 101 F (38.3 C)  TempSrc: Oral Oral  Oral  Resp: 20 20  20   Height:      Weight:    239 lb 6.7 oz (108.6 kg)  SpO2: 94% 96%  98%   Weight change: -18 lb 13.1 oz (-8.535 kg)  Intake/Output Summary (Last 24 hours) at 08/13/15 0918 Last data filed at 08/13/15 0600  Gross per 24 hour  Intake    852 ml  Output   3968 ml  Net  -3116 ml   General: NAD, lying in bed Cardiovascular: tachycardic, no m/r/g Pulmonary/Chest: Clear to auscultation bilaterally, no wheezes, rales, or rhonchi. Abdominal: Soft, non-tender, non-distended, BS + Extremities: no skin breaks or ulcers on right foot, left HERO graft non tender and non erythematous.  Neuro: CN 2-12 grossly intact   Lab Results: Results for orders placed or performed during the hospital encounter of 08/11/15 (from the past 24 hour(s))  Glucose, capillary     Status: Abnormal   Collection Time: 08/12/15  4:06 PM  Result Value Ref Range   Glucose-Capillary 143 (H) 65 - 99 mg/dL   Comment 1 Notify RN   Glucose, capillary     Status: Abnormal   Collection Time: 08/12/15 10:03 PM  Result Value Ref Range   Glucose-Capillary 119 (H) 65 - 99 mg/dL  CBC     Status: Abnormal   Collection Time: 08/13/15  4:38 AM  Result Value Ref Range   WBC 12.4 (H) 4.0 - 10.5 K/uL   RBC 3.10 (L) 4.22 - 5.81 MIL/uL   Hemoglobin 9.4 (L) 13.0 - 17.0 g/dL   HCT 29.2 (L) 39.0 - 52.0 %   MCV 94.2 78.0 - 100.0 fL   MCH 30.3 26.0 - 34.0 pg   MCHC 32.2 30.0 - 36.0 g/dL   RDW 17.4 (H) 11.5 - 15.5 %   Platelets 78 (L) 150 - 400 K/uL  Basic metabolic panel     Status: Abnormal   Collection Time: 08/13/15  4:38 AM  Result Value Ref Range   Sodium 132 (L) 135 - 145  mmol/L   Potassium 3.8 3.5 - 5.1 mmol/L   Chloride 95 (L) 101 - 111 mmol/L   CO2 23 22 - 32 mmol/L   Glucose, Bld 116 (H) 65 - 99 mg/dL   BUN 40 (H) 6 - 20 mg/dL   Creatinine, Ser 10.12 (H) 0.61 - 1.24 mg/dL   Calcium 8.1 (L) 8.9 - 10.3 mg/dL   GFR calc non Af Amer 5 (L) >60 mL/min   GFR calc Af Amer 6 (L) >60 mL/min   Anion gap 14 5 - 15  Glucose, capillary     Status: None   Collection Time: 08/13/15  6:29 AM  Result Value Ref Range   Glucose-Capillary 98 65 - 99 mg/dL      Micro Results: Recent Results (from the past 240 hour(s))  Culture, blood (routine x 2)     Status: None (Preliminary result)   Collection Time: 08/11/15  9:16 AM  Result Value Ref Range Status   Specimen Description BLOOD RIGHT ANTECUBITAL  Final   Special Requests BOTTLES DRAWN AEROBIC AND ANAEROBIC 5CCS  Final  Culture  Setup Time   Final    GRAM POSITIVE COCCI IN CLUSTERS AEROBIC BOTTLE ONLY CRITICAL RESULT CALLED TO, READ BACK BY AND VERIFIED WITH: T WOODS 08/13/15 @ [id number] M VESTAL    Culture GRAM POSITIVE COCCI  Final   Report Status PENDING  Incomplete  Culture, blood (routine x 2)     Status: None   Collection Time: 08/11/15  9:20 AM  Result Value Ref Range Status   Specimen Description BLOOD RIGHT ARM  Final   Special Requests BOTTLES DRAWN AEROBIC AND ANAEROBIC 5CCS  Final   Culture  Setup Time   Final    GRAM POSITIVE COCCI IN CLUSTERS AEROBIC BOTTLE ONLY CRITICAL RESULT CALLED TO, READ BACK BY AND VERIFIED WITH: Alphonse Guild [id number] [place]    Culture NO GROWTH 1 DAY  Final   Report Status 08/13/2015 FINAL  Final  C difficile quick scan w PCR reflex     Status: None   Collection Time: 08/11/15  7:07 PM  Result Value Ref Range Status   C Diff antigen NEGATIVE NEGATIVE Final   C Diff toxin NEGATIVE NEGATIVE Final   C Diff interpretation Negative for toxigenic C. difficile  Final  MRSA PCR Screening     Status: Abnormal   Collection Time: 08/12/15  2:34 AM  Result Value Ref  Range Status   MRSA by PCR POSITIVE (A) NEGATIVE Final    Comment:        The GeneXpert MRSA Assay (FDA approved for NASAL specimens only), is one component of a comprehensive MRSA colonization surveillance program. It is not intended to diagnose MRSA infection nor to guide or monitor treatment for MRSA infections. RESULT CALLED TO, READ BACK BY AND VERIFIED WITH: [place] [id number] @0639  08/13/15 MKELLY    Studies/Results: Dg Chest 2 View  08/12/2015  CLINICAL DATA:  Fever EXAM: CHEST  2 VIEW COMPARISON:  08/11/2015 FINDINGS: There is been interval placement of a dialysis catheter via the left jugular approach. A hero graft is again identified. Cardiac shadow is accentuated by the portable technique. Lungs are well aerated bilaterally. Slight increased density is noted in the right upper lobe which may represent some early infiltrate. No bony abnormality is seen. IMPRESSION: Changes consistent with end-stage renal disease and dialysis. Early right upper lobe infiltrate. Followup examination is recommended. Electronically Signed   By: Inez Catalina M.D.   On: 08/12/2015 07:42   Dg Chest 2 View  08/11/2015  CLINICAL DATA:  Fever, shortness of breath for 3 days 10/21/2012 EXAM: CHEST  2 VIEW COMPARISON:  None. FINDINGS: Cardiomediastinal silhouette is stable. No acute infiltrate or pleural effusion. No pulmonary edema. There is a left subclavian central catheter with tip in right atrium. Bony thorax is unremarkable. IMPRESSION: No active cardiopulmonary disease. Left subclavian central catheter with tip in right atrium. Electronically Signed   By: Lahoma Crocker M.D.   On: 08/11/2015 09:46   Ir Fluoro Guide Cv Line Left  08/12/2015  INDICATION: End-stage renal disease, occluded left upper extremity hero access, no current access, fever, possible infected access site. EXAM: IR LEFT FLOURO GUIDE CV LINE; IR ULTRASOUND GUIDANCE VASC ACCESS LEFT MEDICATIONS: Already given in the emergency room.  ANESTHESIA/SEDATION: None. FLUOROSCOPY TIME:  Fluoroscopy Time: 4 minutes 36 seconds (46 mGy). COMPLICATIONS: None immediate. PROCEDURE: Informed written consent was obtained from the patient after a thorough discussion of the procedural risks, benefits and alternatives. All questions were addressed. Maximal Sterile Barrier Technique was utilized including caps, mask, sterile  gowns, sterile gloves, sterile drape, hand hygiene and skin antiseptic. A timeout was performed prior to the initiation of the procedure. Initially, under sterile conditions and local anesthesia, a right external jugular vein was accessed with a micropuncture needle. This was performed with ultrasound. Guidewire would not advance centrally. 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 Next, ultrasound was performed of the left neck. A left external jugular vein was demonstrated. Also under sterile conditions and local anesthesia, ultrasound micropuncture access performed of the left external jugular vein. Guidewire would not pass central to the hero access. Contrast injection confirms central left innominate occlusion secondary to the catheter. Guidewire inserted followed by a 6 French sheath. Kumpe catheter and Glidewire were advanced across the left innominate occlusion and advanced with some difficulty into the right atrium. Contrast injection confirms position in the right atrium. Guidewire exchanged for an Amplatz guidewire. Tract dilatation performed to insert a 24 cm temporary Trialysis catheter. Tip position in the right atrium. Contrast injection reconfirms position. Catheter secured with Prolene sutures. Blood aspirated easily followed by saline and heparin flushes. External caps applied followed by sterile dressing. No immediate complication. IMPRESSION: Successful ultrasound and fluoroscopic left external jugular temporary Trialysis catheter. Tip proximal right atrium. Access ready for use. Right  central innominate chronic venous occlusion. Electronically Signed   By: Jerilynn Mages.  Shick M.D.   On: 08/12/2015 08:49   Ir US Guide Vasc Access Left  08/12/2015  INDICATION: End-stage renal disease, occluded left upper extremity hero access, no current access, fever, possible infected access site. EXAM: IR LEFT FLOURO GUIDE CV LINE; IR ULTRASOUND GUIDANCE VASC ACCESS LEFT MEDICATIONS: Already given in the emergency room. ANESTHESIA/SEDATION: None. FLUOROSCOPY TIME:  Fluoroscopy Time: 4 minutes 36 seconds (46 mGy). COMPLICATIONS: None immediate. PROCEDURE: Informed written consent was obtained from the patient after a thorough discussion of the procedural risks, benefits and alternatives. All questions were addressed. Maximal Sterile Barrier Technique was utilized including caps, mask, sterile gowns, sterile gloves, sterile drape, hand hygiene and skin antiseptic. A timeout was performed prior to the initiation of the procedure. Initially, under sterile conditions and local anesthesia, a right external jugular vein was accessed with a micropuncture needle. This was performed with ultrasound. Guidewire would not advance centrally. 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 Next, ultrasound was performed of the left neck. A left external jugular vein was demonstrated. Also under sterile conditions and local anesthesia, ultrasound micropuncture access performed of the left external jugular vein. Guidewire would not pass central to the hero access. Contrast injection confirms central left innominate occlusion secondary to the catheter. Guidewire inserted followed by a 6 French sheath. Kumpe catheter and Glidewire were advanced across the left innominate occlusion and advanced with some difficulty into the right atrium. Contrast injection confirms position in the right atrium. Guidewire exchanged for an Amplatz guidewire. Tract dilatation performed to insert a 24 cm temporary Trialysis  catheter. Tip position in the right atrium. Contrast injection reconfirms position. Catheter secured with Prolene sutures. Blood aspirated easily followed by saline and heparin flushes. External caps applied followed by sterile dressing. No immediate complication. IMPRESSION: Successful ultrasound and fluoroscopic left external jugular temporary Trialysis catheter. Tip proximal right atrium. Access ready for use. Right central innominate chronic venous occlusion. Electronically Signed   By: Jerilynn Mages.  Shick M.D.   On: 08/12/2015 08:49   Medications: I have reviewed the patient's current medications. Scheduled Meds: . aspirin EC  81 mg Oral Daily  . [START ON 08/14/2015] calcitRIOL  0.5 mcg Oral Q M,W,F-HD  . calcium carbonate  4 tablet Oral TID WC  . heparin  5,000 Units Subcutaneous 3 times per day  . insulin aspart  0-9 Units Subcutaneous TID WC  . insulin detemir  15 Units Subcutaneous QHS  . multivitamin  1 tablet Oral QHS  . pantoprazole  40 mg Oral BID  . sodium chloride flush  3 mL Intravenous Q12H  . [START ON 08/14/2015] vancomycin  1,000 mg Intravenous Q M,W,F-HD   Continuous Infusions: . sodium chloride Stopped (08/12/15 1003)   PRN Meds:.acetaminophen, loperamide, oxyCODONE Assessment/Plan: Principal Problem:   Sepsis ([place]) Active Problems:   DIASTOLIC DYSFUNCTION   PVD   ESRD on dialysis ([facility])   S/P BKA (below knee amputation) unilateral (HCC)   Diabetes mellitus type 2, uncontrolled ([place])   Fever   Severe sepsis (HCC)  GPC bacteremia-  2/2 blood cultures growing GPC. Pt is still spiking fever with Tmax of 102.71F at 8:00pm last night, and 101 this morning. Leukocytosis has trended down. Spoke with Dr. Donnetta Hutching from [facility] who can remove the HERO graft versus having IR clean out graft. Spoke w/ Dr. Jonnie Finner from nephrology who will discuss removing the HERO graft with Dr. Donnetta Hutching.  - continue IV vanc  - d/c'd zosyn  - on IV fluids 50 cc/hr  Diabetes-  On levemir 20 units qhs at home.    - Levemir 15u qhs - SSI  ESRD on HD- MWF. [facility]. - nephrology following -continue renal supplements   Diarrhea-- C diff negative. Likely from abx. Only required 2 doses of imodium yesterday. - imodium 2mg  prn for diarrhea   Dispo: Disposition is deferred at this time, awaiting improvement of current medical problems.   The patient does have a current PCP Delrae Rend, MD) and does need an [facility] hospital follow-up appointment after discharge.  The patient does not have transportation limitations that hinder transportation to clinic appointments.  .Services Needed at time of discharge: Y = Yes, Blank = No PT:   OT:   RN:   Equipment:   Other:     LOS: 2 days   Norman Herrlich, MD 08/13/2015, 9:18 AM

## 2015-08-13 NOTE — Progress Notes (Signed)
MD notified of positive blood cultures gram positive cocci in both blood draws .

## 2015-08-13 NOTE — Progress Notes (Signed)
Patient ID: [name], male   DOB: [birth year], 50 y.o.   MRN: [id number] Continues to have fevers and gram-positive blood cultures. Discussed with Dr.Shertz. No other obvious source for sepsis so will therefore removed HERO graft. Discussed with patient. This will be done in the operating room tomorrow. Explained removal of the entirety of the Gore-Tex graft in his left arm and the central catheter.

## 2015-08-13 NOTE — Progress Notes (Signed)
[facility] Progress Note   Subjective: still having fevers and blood cx's are both + for GPC  Filed Vitals:   08/12/15 2005 08/12/15 2218 08/13/15 0447 08/13/15 1359  BP: 98/68  94/45 85/50  Pulse: 116  101 105  Temp: 102.7 F (39.3 C) 98.8 F (37.1 C) 101 F (38.3 C) 98.4 F (36.9 C)  TempSrc: Oral  Oral Oral  Resp: 20  20 20   Height:      Weight:   108.6 kg (239 lb 6.7 oz)   SpO2: 96%  98% 90%    Inpatient medications: . aspirin EC  81 mg Oral Daily  . [START ON 08/14/2015] calcitRIOL  2.5 mcg Oral Q M,W,F-HD  . calcium carbonate  4 tablet Oral TID WC  . heparin  5,000 Units Subcutaneous 3 times per day  . insulin aspart  0-9 Units Subcutaneous TID WC  . insulin detemir  15 Units Subcutaneous QHS  . multivitamin  1 tablet Oral QHS  . pantoprazole  40 mg Oral BID  . sodium chloride flush  3 mL Intravenous Q12H  . [START ON 08/14/2015] vancomycin  1,000 mg Intravenous Q M,W,F-HD   . sodium chloride Stopped (08/12/15 1003)   acetaminophen, loperamide, oxyCODONE  Exam: Alert, no distress on HD No jvd Chest clear bilat RRR no mrg Abd soft obese ntnd no ascites Ext R Charcot foot, L BKA trace LE edema LUA HeRO access no bruit/ thrill, no wounds/ drainage Neuro alert nf ox 3 Temp L IJ HD cath   CXR 1/27 no acute   D[facility]: [facility]  4.5h  110.5kg   2/2 bath  Heparin 4500/ 1800 midRx  LUE HeRO Calc 0.5 ug tiw Mircera 50 q2, next 2/1  Venofer 50/wk Hb 11.2  Ca/P - 8/10.5 pth 529  Assessment: 1 Fever/ gram +bacteremia - in ESRD patient w HeRO access this should be considered infection of the permanent access and it should be removed.  The temporary left IJ HD cath placed two days ago after admission does not need to be removed at this time. Have d/w [facility], plan OR tomorrow for full HeRO access removal (goretex and silastic segments) 2 ESRD MWF HD 3 Volume - will raise dry weight ~ 113kg; patient concerned that recently has developed low BP episodes w  HD and was started on midodrine when his dry wt was lowered after BKA. On admit was 117kg w clear CXR. 5 Anemia Hb 10.2, esa due next wed 6 MBD cont meds 7 CM2 8 PAD L BKA  Plan - OR tomorrow.  Raise dry wt, let wt come up and see if can come off of midodrine. HD Monday after surgery.    Kelly Splinter MD [facility] pager [phone]    cell [phone] 08/13/2015, 3:10 PM    Recent Labs Lab 08/11/15 0928 08/12/15 0331 08/13/15 0438  NA 132* 134* 132*  K 4.1 4.1 3.8  CL 86* 93* 95*  CO2 25 25 23   GLUCOSE 410* 130* 116*  BUN 49* 58* 40*  CREATININE 12.61* 13.35* 10.12*  CALCIUM 8.3* 8.0* 8.1*  PHOS  --  7.2*  --     Recent Labs Lab 08/11/15 0928 08/12/15 0331  AST 19 17  ALT 15* 14*  ALKPHOS 72 69  BILITOT 1.5* 1.4*  PROT 7.6 6.8  ALBUMIN 3.0* 2.4*    Recent Labs Lab 08/11/15 0928 08/12/15 0331 08/13/15 0438  WBC 20.2* 13.1* 12.4*  NEUTROABS 17.8*  --   --  HGB 10.2* 8.9* 9.4*  HCT 32.6* 28.6* 29.2*  MCV 94.8 95.0 94.2  PLT 71* 69* 78*

## 2015-08-14 ENCOUNTER — Encounter (HOSPITAL_COMMUNITY): Payer: Self-pay | Admitting: Anesthesiology

## 2015-08-14 ENCOUNTER — Inpatient Hospital Stay (HOSPITAL_COMMUNITY): Payer: Medicare Other | Admitting: Anesthesiology

## 2015-08-14 ENCOUNTER — Encounter (HOSPITAL_COMMUNITY)
Admission: EM | Disposition: A | Discharge status: Home / Self Care | Payer: Self-pay | Source: Home / Self Care | Attending: Oncology

## 2015-08-14 DIAGNOSIS — Z992 Dependence on renal dialysis: Secondary | ICD-10-CM

## 2015-08-14 DIAGNOSIS — K219 Gastro-esophageal reflux disease without esophagitis: Secondary | ICD-10-CM

## 2015-08-14 DIAGNOSIS — E1122 Type 2 diabetes mellitus with diabetic chronic kidney disease: Secondary | ICD-10-CM

## 2015-08-14 DIAGNOSIS — T827XXA Infection and inflammatory reaction due to other cardiac and vascular devices, implants and grafts, initial encounter: Principal | ICD-10-CM

## 2015-08-14 DIAGNOSIS — Y832 Surgical operation with anastomosis, bypass or graft as the cause of abnormal reaction of the patient, or of later complication, without mention of misadventure at the time of the procedure: Secondary | ICD-10-CM

## 2015-08-14 DIAGNOSIS — R7881 Bacteremia: Secondary | ICD-10-CM

## 2015-08-14 DIAGNOSIS — B9562 Methicillin resistant Staphylococcus aureus infection as the cause of diseases classified elsewhere: Secondary | ICD-10-CM | POA: Diagnosis present

## 2015-08-14 DIAGNOSIS — R6521 Severe sepsis with septic shock: Secondary | ICD-10-CM

## 2015-08-14 DIAGNOSIS — B9689 Other specified bacterial agents as the cause of diseases classified elsewhere: Secondary | ICD-10-CM

## 2015-08-14 DIAGNOSIS — N186 End stage renal disease: Secondary | ICD-10-CM

## 2015-08-14 DIAGNOSIS — A419 Sepsis, unspecified organism: Secondary | ICD-10-CM | POA: Diagnosis present

## 2015-08-14 HISTORY — PX: REMOVAL OF A HERO DEVICE: SHX6054

## 2015-08-14 LAB — LACTIC ACID, PLASMA: LACTIC ACID, VENOUS: 0.6 mmol/L (ref 0.5–2.0)

## 2015-08-14 LAB — BASIC METABOLIC PANEL
ANION GAP: 16 — AB (ref 5–15)
ANION GAP: 17 — AB (ref 5–15)
BUN: 58 mg/dL — ABNORMAL HIGH (ref 6–20)
BUN: 64 mg/dL — ABNORMAL HIGH (ref 6–20)
CALCIUM: 7.8 mg/dL — AB (ref 8.9–10.3)
CO2: 24 mmol/L (ref 22–32)
CO2: 23 mmol/L (ref 22–32)
CREATININE: 13.6 mg/dL — AB (ref 0.61–1.24)
Calcium: 8.2 mg/dL — ABNORMAL LOW (ref 8.9–10.3)
Chloride: 93 mmol/L — ABNORMAL LOW (ref 101–111)
Chloride: 95 mmol/L — ABNORMAL LOW (ref 101–111)
Creatinine, Ser: 12.28 mg/dL — ABNORMAL HIGH (ref 0.61–1.24)
GFR, EST AFRICAN AMERICAN: 5 mL/min — AB (ref >60)
GFR, EST AFRICAN AMERICAN: 4 mL/min — AB (ref >60)
GFR, EST NON AFRICAN AMERICAN: 4 mL/min — AB (ref >60)
GFR, EST NON AFRICAN AMERICAN: 4 mL/min — AB (ref >60)
Glucose, Bld: 167 mg/dL — ABNORMAL HIGH (ref 65–99)
Glucose, Bld: 95 mg/dL (ref 65–99)
POTASSIUM: 3.9 mmol/L (ref 3.5–5.1)
Potassium: 4.3 mmol/L (ref 3.5–5.1)
SODIUM: 133 mmol/L — AB (ref 135–145)
SODIUM: 135 mmol/L (ref 135–145)

## 2015-08-14 LAB — CBC
HEMATOCRIT: 28 % — AB (ref 39.0–52.0)
HEMOGLOBIN: 9 g/dL — AB (ref 13.0–17.0)
MCH: 29.7 pg (ref 26.0–34.0)
MCHC: 32.1 g/dL (ref 30.0–36.0)
MCV: 92.4 fL (ref 78.0–100.0)
Platelets: 126 10*3/uL — ABNORMAL LOW (ref 150–400)
RBC: 3.03 MIL/uL — ABNORMAL LOW (ref 4.22–5.81)
RDW: 17.2 % — ABNORMAL HIGH (ref 11.5–15.5)
WBC: 11.5 10*3/uL — AB (ref 4.0–10.5)

## 2015-08-14 LAB — CBC WITH DIFFERENTIAL/PLATELET
Basophils Absolute: 0 10*3/uL (ref 0.0–0.1)
Basophils Relative: 0 %
EOS ABS: 0.5 10*3/uL (ref 0.0–0.7)
EOS PCT: 4 %
HCT: 26.1 % — ABNORMAL LOW (ref 39.0–52.0)
Hemoglobin: 8.6 g/dL — ABNORMAL LOW (ref 13.0–17.0)
LYMPHS ABS: 1.8 10*3/uL (ref 0.7–4.0)
Lymphocytes Relative: 14 %
MCH: 30.4 pg (ref 26.0–34.0)
MCHC: 33 g/dL (ref 30.0–36.0)
MCV: 92.2 fL (ref 78.0–100.0)
MONO ABS: 2 10*3/uL — AB (ref 0.1–1.0)
Monocytes Relative: 15 %
NEUTROS PCT: 67 %
Neutro Abs: 8.7 10*3/uL — ABNORMAL HIGH (ref 1.7–7.7)
PLATELETS: 161 10*3/uL (ref 150–400)
RBC: 2.83 MIL/uL — AB (ref 4.22–5.81)
RDW: 17.3 % — AB (ref 11.5–15.5)
WBC: 13 10*3/uL — AB (ref 4.0–10.5)

## 2015-08-14 LAB — GLUCOSE, CAPILLARY
GLUCOSE-CAPILLARY: 140 mg/dL — AB (ref 65–99)
GLUCOSE-CAPILLARY: 96 mg/dL (ref 65–99)
Glucose-Capillary: 91 mg/dL (ref 65–99)
Glucose-Capillary: 126 mg/dL — ABNORMAL HIGH (ref 65–99)

## 2015-08-14 LAB — TROPONIN I

## 2015-08-14 LAB — CK: CK TOTAL: 66 U/L (ref 49–397)

## 2015-08-14 LAB — SURGICAL PCR SCREEN
MRSA, PCR: POSITIVE — AB
STAPHYLOCOCCUS AUREUS: POSITIVE — AB

## 2015-08-14 SURGERY — REMOVAL, TUNNELED HEMODIALYSIS ACCESS GRAFT CONNECTED TO CENTRAL VENOUS CATHETER
Anesthesia: General | Site: Arm Upper | Laterality: Left

## 2015-08-14 MED ORDER — MIDAZOLAM HCL 2 MG/2ML IJ SOLN
INTRAMUSCULAR | Status: AC
  Filled 2015-08-14: qty 2

## 2015-08-14 MED ORDER — CEFAZOLIN SODIUM-DEXTROSE 2-3 GM-% IV SOLR
2.0000 g | INTRAVENOUS | Status: DC
  Administered 2015-08-15: 2 g via INTRAVENOUS
  Filled 2015-08-14 (×2): qty 50

## 2015-08-14 MED ORDER — SODIUM CHLORIDE 0.9 % IV SOLN: 100.0000 mL | INTRAVENOUS | Status: DC | PRN

## 2015-08-14 MED ORDER — PROTAMINE SULFATE 10 MG/ML IV SOLN
INTRAVENOUS | Status: AC
  Filled 2015-08-14: qty 5

## 2015-08-14 MED ORDER — HYDROMORPHONE HCL 1 MG/ML IJ SOLN
0.2500 mg | INTRAMUSCULAR | Status: DC | PRN
  Administered 2015-08-14: 0.5 mg via INTRAVENOUS
  Administered 2015-08-14 (×2): 0.25 mg via INTRAVENOUS
  Administered 2015-08-14: 0.5 mg via INTRAVENOUS

## 2015-08-14 MED ORDER — ALTEPLASE 2 MG IJ SOLR
2.0000 mg | Freq: Once | INTRAMUSCULAR | Status: AC | PRN
  Administered 2015-08-18: 6 mg
  Filled 2015-08-14 (×3): qty 2

## 2015-08-14 MED ORDER — OXYCODONE HCL 5 MG PO TABS
5.0000 mg | ORAL_TABLET | ORAL | Status: DC | PRN
  Administered 2015-08-14 – 2015-08-15 (×3): 5 mg via ORAL
  Administered 2015-08-15: 10 mg via ORAL
  Administered 2015-08-15: 5 mg via ORAL
  Administered 2015-08-16 – 2015-08-19 (×6): 10 mg via ORAL
  Filled 2015-08-14 (×3): qty 2
  Filled 2015-08-14: qty 1
  Filled 2015-08-14 (×2): qty 2
  Filled 2015-08-14: qty 1
  Filled 2015-08-14: qty 2
  Filled 2015-08-14 (×2): qty 1
  Filled 2015-08-14 (×2): qty 2

## 2015-08-14 MED ORDER — MUPIROCIN 2 % EX OINT
1.0000 "application " | TOPICAL_OINTMENT | Freq: Two times a day (BID) | CUTANEOUS | Status: AC
  Administered 2015-08-14 – 2015-08-18 (×9): 1 via NASAL
  Filled 2015-08-14 (×3): qty 22

## 2015-08-14 MED ORDER — FENTANYL CITRATE (PF) 250 MCG/5ML IJ SOLN
INTRAMUSCULAR | Status: AC
  Filled 2015-08-14: qty 5

## 2015-08-14 MED ORDER — SODIUM CHLORIDE 0.9 % IV SOLN
INTRAVENOUS | Status: DC
  Administered 2015-08-14: 11:00:00 via INTRAVENOUS
  Administered 2015-08-15: 50 mL/h via INTRAVENOUS
  Administered 2015-08-15: 22:00:00 via INTRAVENOUS

## 2015-08-14 MED ORDER — LIDOCAINE-EPINEPHRINE 0.5 %-1:200000 IJ SOLN
INTRAMUSCULAR | Status: AC
  Filled 2015-08-14: qty 1

## 2015-08-14 MED ORDER — MIDAZOLAM HCL 5 MG/5ML IJ SOLN
INTRAMUSCULAR | Status: DC | PRN
  Administered 2015-08-14: 2 mg via INTRAVENOUS

## 2015-08-14 MED ORDER — SODIUM CHLORIDE 0.9 % IV BOLUS (SEPSIS)
500.0000 mL | Freq: Once | INTRAVENOUS | Status: AC
  Administered 2015-08-14: 500 mL via INTRAVENOUS

## 2015-08-14 MED ORDER — HYDROMORPHONE HCL 1 MG/ML IJ SOLN
INTRAMUSCULAR | Status: AC
  Filled 2015-08-14: qty 1

## 2015-08-14 MED ORDER — HEPARIN SODIUM (PORCINE) 1000 UNIT/ML DIALYSIS: 1000.0000 [IU] | INTRAMUSCULAR | Status: DC | PRN

## 2015-08-14 MED ORDER — PROPOFOL 10 MG/ML IV BOLUS
INTRAVENOUS | Status: DC | PRN
  Administered 2015-08-14: 200 mg via INTRAVENOUS
  Administered 2015-08-14: 30 mg via INTRAVENOUS

## 2015-08-14 MED ORDER — 0.9 % SODIUM CHLORIDE (POUR BTL) OPTIME
TOPICAL | Status: DC | PRN
  Administered 2015-08-14: 1000 mL

## 2015-08-14 MED ORDER — PROMETHAZINE HCL 25 MG/ML IJ SOLN: 6.2500 mg | INTRAMUSCULAR | Status: DC | PRN

## 2015-08-14 MED ORDER — LIDOCAINE HCL (CARDIAC) 20 MG/ML IV SOLN
INTRAVENOUS | Status: AC
  Filled 2015-08-14: qty 5

## 2015-08-14 MED ORDER — HYDROMORPHONE HCL 1 MG/ML IJ SOLN
0.5000 mg | INTRAMUSCULAR | Status: DC | PRN
  Administered 2015-08-14 – 2015-08-15 (×3): 1 mg via INTRAVENOUS
  Filled 2015-08-14 (×3): qty 1

## 2015-08-14 MED ORDER — PHENYLEPHRINE HCL 10 MG/ML IJ SOLN
INTRAMUSCULAR | Status: DC | PRN
  Administered 2015-08-14: 40 ug via INTRAVENOUS
  Administered 2015-08-14 (×4): 80 ug via INTRAVENOUS

## 2015-08-14 MED ORDER — CHLORHEXIDINE GLUCONATE CLOTH 2 % EX PADS
6.0000 | MEDICATED_PAD | Freq: Every day | CUTANEOUS | Status: AC
  Administered 2015-08-14 – 2015-08-18 (×5): 6 via TOPICAL

## 2015-08-14 MED ORDER — NEOSTIGMINE METHYLSULFATE 10 MG/10ML IV SOLN
INTRAVENOUS | Status: AC
  Filled 2015-08-14: qty 1

## 2015-08-14 MED ORDER — ALBUMIN HUMAN 5 % IV SOLN
INTRAVENOUS | Status: DC | PRN
  Administered 2015-08-14: 12:00:00 via INTRAVENOUS

## 2015-08-14 MED ORDER — PROPOFOL 10 MG/ML IV BOLUS
INTRAVENOUS | Status: AC
  Filled 2015-08-14: qty 20

## 2015-08-14 MED ORDER — LIDOCAINE HCL (PF) 1 % IJ SOLN: 5.0000 mL | INTRAMUSCULAR | Status: DC | PRN

## 2015-08-14 MED ORDER — MEPERIDINE HCL 25 MG/ML IJ SOLN
6.2500 mg | INTRAMUSCULAR | Status: DC | PRN
Start: 2015-08-14 — End: 2015-08-14

## 2015-08-14 MED ORDER — ONDANSETRON HCL 4 MG/2ML IJ SOLN
INTRAMUSCULAR | Status: AC
  Filled 2015-08-14: qty 2

## 2015-08-14 MED ORDER — LIDOCAINE-PRILOCAINE 2.5-2.5 % EX CREA
1.0000 "application " | TOPICAL_CREAM | CUTANEOUS | Status: DC | PRN
  Filled 2015-08-14: qty 5

## 2015-08-14 MED ORDER — PHENYLEPHRINE 40 MCG/ML (10ML) SYRINGE FOR IV PUSH (FOR BLOOD PRESSURE SUPPORT)
PREFILLED_SYRINGE | INTRAVENOUS | Status: AC
  Filled 2015-08-14: qty 10

## 2015-08-14 MED ORDER — PHENYLEPHRINE HCL 10 MG/ML IJ SOLN
0.0000 ug/min | INTRAVENOUS | Status: DC
  Administered 2015-08-15: 30 ug/min via INTRAVENOUS
  Administered 2015-08-15: 40 ug/min via INTRAVENOUS
  Filled 2015-08-14 (×2): qty 2

## 2015-08-14 MED ORDER — PENTAFLUOROPROP-TETRAFLUOROETH EX AERO: 1.0000 "application " | INHALATION_SPRAY | CUTANEOUS | Status: DC | PRN

## 2015-08-14 MED ORDER — ONDANSETRON HCL 4 MG/2ML IJ SOLN
INTRAMUSCULAR | Status: DC | PRN
  Administered 2015-08-14: 4 mg via INTRAVENOUS

## 2015-08-14 MED ORDER — LIDOCAINE HCL (CARDIAC) 20 MG/ML IV SOLN
INTRAVENOUS | Status: DC | PRN
  Administered 2015-08-14: 100 mg via INTRAVENOUS

## 2015-08-14 MED ORDER — GLYCOPYRROLATE 0.2 MG/ML IJ SOLN
INTRAMUSCULAR | Status: AC
  Filled 2015-08-14: qty 2

## 2015-08-14 MED ORDER — DEXTROSE 5 % IV SOLN
10.0000 mg | INTRAVENOUS | Status: DC | PRN
  Administered 2015-08-14: 25 ug/min via INTRAVENOUS

## 2015-08-14 MED ORDER — FENTANYL CITRATE (PF) 100 MCG/2ML IJ SOLN
INTRAMUSCULAR | Status: DC | PRN
Start: 2015-08-14 — End: 2015-08-14
  Administered 2015-08-14 (×6): 25 ug via INTRAVENOUS
  Administered 2015-08-14: 50 ug via INTRAVENOUS

## 2015-08-14 MED ORDER — ROCURONIUM BROMIDE 50 MG/5ML IV SOLN
INTRAVENOUS | Status: AC
  Filled 2015-08-14: qty 1

## 2015-08-14 MED ORDER — PHENYLEPHRINE HCL 10 MG/ML IJ SOLN
0.0000 ug/min | INTRAMUSCULAR | Status: DC
  Administered 2015-08-14: 20 ug/min via INTRAVENOUS
  Filled 2015-08-14: qty 2

## 2015-08-14 SURGICAL SUPPLY — 48 items
APL SKNCLS STERI-STRIP NONHPOA (GAUZE/BANDAGES/DRESSINGS) ×1
BENZOIN TINCTURE PRP APPL 2/3 (GAUZE/BANDAGES/DRESSINGS) ×3 IMPLANT
BNDG GAUZE ELAST 4 BULKY (GAUZE/BANDAGES/DRESSINGS) ×2 IMPLANT
CANISTER SUCTION 2500CC (MISCELLANEOUS) ×3 IMPLANT
CLIP LIGATING EXTRA MED SLVR (CLIP) ×3 IMPLANT
CLIP LIGATING EXTRA SM BLUE (MISCELLANEOUS) ×3 IMPLANT
CLOSURE WOUND 1/2 X4 (GAUZE/BANDAGES/DRESSINGS) ×1
COVER PROBE W GEL 5X96 (DRAPES) ×1 IMPLANT
DECANTER SPIKE VIAL GLASS SM (MISCELLANEOUS) ×1 IMPLANT
DRAIN PENROSE 1/2X12 LTX STRL (WOUND CARE) ×4 IMPLANT
DRAPE C-ARM 42X72 X-RAY (DRAPES) ×1 IMPLANT
DRAPE CHEST BREAST 15X10 FENES (DRAPES) ×1 IMPLANT
DRSG PAD ABDOMINAL 8X10 ST (GAUZE/BANDAGES/DRESSINGS) ×2 IMPLANT
DRSG TEGADERM 4X4.75 (GAUZE/BANDAGES/DRESSINGS) ×2 IMPLANT
ELECT REM PT RETURN 9FT ADLT (ELECTROSURGICAL) ×3
ELECTRODE REM PT RTRN 9FT ADLT (ELECTROSURGICAL) ×1 IMPLANT
GAUZE SPONGE 4X4 12PLY STRL (GAUZE/BANDAGES/DRESSINGS) ×3 IMPLANT
GLOVE BIOGEL PI IND STRL 6.5 (GLOVE) IMPLANT
GLOVE BIOGEL PI IND STRL 7.5 (GLOVE) IMPLANT
GLOVE BIOGEL PI INDICATOR 6.5 (GLOVE) ×8
GLOVE BIOGEL PI INDICATOR 7.5 (GLOVE) ×2
GLOVE ECLIPSE 6.5 STRL STRAW (GLOVE) ×4 IMPLANT
GLOVE ECLIPSE 7.0 STRL STRAW (GLOVE) ×2 IMPLANT
GLOVE SS BIOGEL STRL SZ 7.5 (GLOVE) ×1 IMPLANT
GLOVE SUPERSENSE BIOGEL SZ 7.5 (GLOVE) ×2
GOWN STRL REUS W/ TWL LRG LVL3 (GOWN DISPOSABLE) ×3 IMPLANT
GOWN STRL REUS W/ TWL XL LVL3 (GOWN DISPOSABLE) IMPLANT
GOWN STRL REUS W/TWL LRG LVL3 (GOWN DISPOSABLE) ×9
GOWN STRL REUS W/TWL XL LVL3 (GOWN DISPOSABLE) ×3
KIT BASIN OR (CUSTOM PROCEDURE TRAY) ×3 IMPLANT
KIT ROOM TURNOVER OR (KITS) ×3 IMPLANT
NS IRRIG 1000ML POUR BTL (IV SOLUTION) ×3 IMPLANT
PACK CV ACCESS (CUSTOM PROCEDURE TRAY) ×3 IMPLANT
PAD ARMBOARD 7.5X6 YLW CONV (MISCELLANEOUS) ×6 IMPLANT
SPONGE GAUZE 4X4 12PLY STER LF (GAUZE/BANDAGES/DRESSINGS) ×2 IMPLANT
STRIP CLOSURE SKIN 1/2X4 (GAUZE/BANDAGES/DRESSINGS) ×2 IMPLANT
SUT ETHILON 2 0 PSLX (SUTURE) ×2 IMPLANT
SUT PROLENE 6 0 CC (SUTURE) ×5 IMPLANT
SUT VIC AB 3-0 SH 27 (SUTURE) ×9
SUT VIC AB 3-0 SH 27X BRD (SUTURE) ×1 IMPLANT
SUT VICRYL 4-0 PS2 18IN ABS (SUTURE) ×5 IMPLANT
SWAB COLLECTION DEVICE MRSA (MISCELLANEOUS) ×2 IMPLANT
SWAB CULTURE ESWAB REG 1ML (MISCELLANEOUS) ×2 IMPLANT
SYR 30ML LL (SYRINGE) IMPLANT
SYR 5ML LL (SYRINGE) ×1 IMPLANT
TAPE CLOTH SURG 4X10 WHT LF (GAUZE/BANDAGES/DRESSINGS) ×2 IMPLANT
UNDERPAD 30X30 INCONTINENT (UNDERPADS AND DIAPERS) ×3 IMPLANT
WATER STERILE IRR 1000ML POUR (IV SOLUTION) ×3 IMPLANT

## 2015-08-14 NOTE — Progress Notes (Signed)
     Postt op removal Left Hero graft Systolic BP [id number].  He is currently on 6 L. O2 SAT drops 70-80 without O2.   Intraoperative he received  Albumin 250 ml/of 5%, 400 cc of NS and in PACU he received 500 cc NS bolus.    Critical care Dr. Elsworth Soho was called for consult.  He istructed me to order EKG, troponin's, and BMET.  Dr. Oneida Alar instructed me to start Neo drip. And he will be transferred to ICU.  Elnore Cosens MAUREEN PA-C

## 2015-08-14 NOTE — Progress Notes (Signed)
[name] Progress Note   Subjective: NO new complaints this AM- fever and WBC going down- for removal of HERO today   Filed Vitals:   08/13/15 1359 08/13/15 2032 08/13/15 2221 08/14/15 0543  BP: 85/50 123/51  123/60  Pulse: 105 108  102  Temp: 98.4 F (36.9 C) 101.1 F (38.4 C) 99.8 F (37.7 C) 99.6 F (37.6 C)  TempSrc: Oral Oral Oral Oral  Resp: 20 20  18   Height:      Weight:    107.98 kg (238 lb 0.8 oz)  SpO2: 90% 94%  92%    Inpatient medications: . aspirin EC  81 mg Oral Daily  . calcitRIOL  2.5 mcg Oral Q M,W,F-HD  . calcium carbonate  4 tablet Oral TID WC  . Chlorhexidine Gluconate Cloth  6 each Topical Q0600  . heparin  5,000 Units Subcutaneous 3 times per day  . insulin aspart  0-9 Units Subcutaneous TID WC  . insulin detemir  15 Units Subcutaneous QHS  . multivitamin  1 tablet Oral QHS  . mupirocin ointment  1 application Nasal BID  . pantoprazole  40 mg Oral BID  . sodium chloride flush  3 mL Intravenous Q12H  . vancomycin  1,000 mg Intravenous Q M,W,F-HD   . sodium chloride Stopped (08/12/15 1003)   acetaminophen, loperamide, oxyCODONE  Exam: Alert, no distress  No jvd Chest clear bilat RRR no mrg Abd soft obese ntnd no ascites Ext R Charcot foot, L BKA trace LE edema LUA HeRO access no bruit/ thrill, no wounds/ drainage Neuro alert nf ox 3 Temp L IJ HD cath   CXR 1/27 no acute   Dialysis: [facility]  4.5h  110.5kg   2/2 bath  Heparin 4500/ 1800 midRx  LUE HeRO Calc 0.5 ug tiw Mircera 50 q2, next 2/1  Venofer 50/wk Hb 11.2  Ca/P - 8/10.5 pth 529  Assessment: 1 Fever/ gram +bacteremia - in ESRD patient w HeRO access this should be considered infection of the permanent access and it should be removed.  The temporary left IJ HD cath placed two days ago after admission does not need to be removed at this time. Have d/w [facility], plan OR today  for full HeRO access removal (goretex and silastic segments)- WBC improving 2 ESRD MWF HD-  got done Saturday for Friday- no needs today but arrangements made for HD post op  3 Volume - raise dry weight ~ 113kg; patient concerned that recently has developed low BP episodes w HD and was started on midodrine when his dry wt was lowered after BKA. On admit was 117kg w clear CXR. With low sodium raises issue of volume- has big gains as OP  5 Anemia Hb 10.2, esa due next wed- now down to 9.0- will follow- surgery will likely cause it to go down 6 MBD cont meds- TUMS/calcitriol- phos notoriously not well controlled  7 CM2 8 PAD L BKA  Diron Haddon A   08/14/2015, 9:01 AM    Recent Labs Lab 08/12/15 0331 08/13/15 0438 08/14/15 0437  NA 134* 132* 133*  K 4.1 3.8 3.9  CL 93* 95* 93*  CO2 25 23 24   GLUCOSE 130* 116* 167*  BUN 58* 40* 58*  CREATININE 13.35* 10.12* 12.28*  CALCIUM 8.0* 8.1* 8.2*  PHOS 7.2*  --   --     Recent Labs Lab 08/11/15 0928 08/12/15 0331  AST 19 17  ALT 15* 14*  ALKPHOS 72 69  BILITOT 1.5* 1.4*  PROT 7.6 6.8  ALBUMIN 3.0* 2.4*    Recent Labs Lab 08/11/15 0928 08/12/15 0331 08/13/15 0438 08/14/15 0437  WBC 20.2* 13.1* 12.4* 11.5*  NEUTROABS 17.8*  --   --   --   HGB 10.2* 8.9* 9.4* 9.0*  HCT 32.6* 28.6* 29.2* 28.0*  MCV 94.8 95.0 94.2 92.4  PLT 71* 69* 78* 126*

## 2015-08-14 NOTE — Anesthesia Preprocedure Evaluation (Addendum)
Anesthesia Evaluation  Patient identified by MRN, date of birth, ID band Patient awake    Reviewed: Allergy & Precautions, NPO status , Patient's Chart, lab work & pertinent test results  Airway Mallampati: II  TM Distance: >3 FB Neck ROM: Full    Dental  (+) Partial Lower, Partial Upper, Dental Advisory Given   Pulmonary pneumonia, former smoker,     + decreased breath sounds      Cardiovascular hypertension, + Peripheral Vascular Disease  + dysrhythmias  Rhythm:Regular Rate:Normal     Neuro/Psych    GI/Hepatic hiatal hernia, GERD  ,  Endo/Other  diabetes  Renal/GU ESRFRenal disease     Musculoskeletal   Abdominal (+) + obese,   Peds  Hematology  (+) anemia ,   Anesthesia Other Findings   Reproductive/Obstetrics                           Anesthesia Physical  Anesthesia Plan  ASA: III  Anesthesia Plan: General   Post-op Pain Management:    Induction: Intravenous  Airway Management Planned: LMA  Additional Equipment:   Intra-op Plan:   Post-operative Plan: Extubation in OR  Informed Consent: I have reviewed the patients History and Physical, chart, labs and discussed the procedure including the risks, benefits and alternatives for the proposed anesthesia with the patient or authorized representative who has indicated his/her understanding and acceptance.   Dental advisory given  Plan Discussed with: CRNA  Anesthesia Plan Comments:         Anesthesia Quick Evaluation

## 2015-08-14 NOTE — H&P (View-Only) (Signed)
Patient ID: [name], male   DOB: February 12, 1966, 50 y.o.   MRN: [id number] Continues to have fevers and gram-positive blood cultures. Discussed with Dr.Shertz. No other obvious source for sepsis so will therefore removed HERO graft. Discussed with patient. This will be done in the operating room tomorrow. Explained removal of the entirety of the Gore-Tex graft in his left arm and the central catheter.

## 2015-08-14 NOTE — Clinical Documentation Improvement (Signed)
Internal Medicine  Can the diagnosis of anemia be further specified? Please document findings in next progress note NOT in BPA drop down box. Thanks!   Iron deficiency Anemia  Nutritional anemia, including the nutrition or mineral deficits  Chronic Blood Loss Anemia, including the suspected or known cause  Anemia of chronic disease, including the associated chronic disease state  Other  Clinically Undetermined  Document any associated diagnoses/conditions.  Supporting Information:  ESRD on hemodialysis  Please exercise your independent, professional judgment when responding. A specific answer is not anticipated or expected.  Thank You,  Zoila Shutter RN, BSN, [facility] [phone]; Cell: [phone]

## 2015-08-14 NOTE — Interval H&P Note (Signed)
History and Physical Interval Note:  08/14/2015 11:02 AM  [name]  has presented today for surgery, with the diagnosis of ESRD  The various methods of treatment have been discussed with the patient and family. After consideration of risks, benefits and other options for treatment, the patient has consented to  Procedure(s): REMOVAL OF A HERO DEVICE (Left) as a surgical intervention .  The patient's history has been reviewed, patient examined, no change in status, stable for surgery.  I have reviewed the patient's chart and labs.  Questions were answered to the patient's satisfaction.     Curt Jews

## 2015-08-14 NOTE — Anesthesia Postprocedure Evaluation (Signed)
Anesthesia Post Note  Patient: [name]  Procedure(s) Performed: Procedure(s) (LRB): REMOVAL OF LEFT ARM HERO DEVICE (Left)  Patient location during evaluation: PACU Anesthesia Type: General Level of consciousness: sedated and patient cooperative Pain management: pain level controlled Vital Signs Assessment: post-procedure vital signs reviewed and stable Respiratory status: spontaneous breathing Cardiovascular status: stable Anesthetic complications: no    Last Vitals:  Filed Vitals:   08/14/15 1515 08/14/15 1530  BP: 70/21 92/62  Pulse: 103 101  Temp:    Resp: 17 17    Last Pain:  Filed Vitals:   08/14/15 1535  PainSc: 10-Worst pain ever                 Nolon Nations

## 2015-08-14 NOTE — Transfer of Care (Signed)
Immediate Anesthesia Transfer of Care Note  Patient: [name]  Procedure(s) Performed: Procedure(s): REMOVAL OF LEFT ARM HERO DEVICE (Left)  Patient Location: PACU  Anesthesia Type:General  Level of Consciousness: awake, oriented and patient cooperative  Airway & Oxygen Therapy: Patient Spontanous Breathing and Patient connected to face mask oxygen  Post-op Assessment: Report given to RN and Post -op Vital signs reviewed and stable  Post vital signs: Reviewed  Last Vitals:  Filed Vitals:   08/13/15 2221 08/14/15 0543  BP:  123/60  Pulse:  102  Temp: 37.7 C 37.6 C  Resp:  18    Complications: No apparent anesthesia complications

## 2015-08-14 NOTE — Progress Notes (Signed)
Subjective:  Patient seen and examined at bedside. There were no acute events overnight. Denies diarrhea  Blood cultures growing 2/2 gp cocci, speciation pending.   No complaints this morning. Tmax 101.42F overnight.   Going to OR today for removal of HERO graft   Objective: Vital signs in last 24 hours: Filed Vitals:   08/13/15 1359 08/13/15 2032 08/13/15 2221 08/14/15 0543  BP: 85/50 123/51  123/60  Pulse: 105 108  102  Temp: 98.4 F (36.9 C) 101.1 F (38.4 C) 99.8 F (37.7 C) 99.6 F (37.6 C)  TempSrc: Oral Oral Oral Oral  Resp: 20 20  18   Height:      Weight:    238 lb 0.8 oz (107.98 kg)  SpO2: 90% 94%  92%   Weight change: -3 lb 2.1 oz (-1.42 kg)  Intake/Output Summary (Last 24 hours) at 08/14/15 1012 Last data filed at 08/14/15 0751  Gross per 24 hour  Intake    720 ml  Output      3 ml  Net    717 ml   General: Vital signs reviewed. Patient in no acute distress Cardiovascular: tachycardic to 100s, regular rhythm, no murmur appreciated  Pulmonary/Chest: Clear to auscultation bilaterally, no wheezes, rales, or rhonchi. Abdominal: Soft, non-tender, non-distended, BS + Extremities: trace pitting edema on right leg  Pt with left BKA , nontender no discharge,   Has HERO graft on left arm- without erythema or tenderness   Lab Results: Results for orders placed or performed during the hospital encounter of 08/11/15 (from the past 24 hour(s))  Glucose, capillary     Status: Abnormal   Collection Time: 08/13/15 11:33 AM  Result Value Ref Range   Glucose-Capillary 143 (H) 65 - 99 mg/dL  Glucose, capillary     Status: Abnormal   Collection Time: 08/13/15  4:35 PM  Result Value Ref Range   Glucose-Capillary 163 (H) 65 - 99 mg/dL   Comment 1 Notify RN   Glucose, capillary     Status: Abnormal   Collection Time: 08/13/15  9:53 PM  Result Value Ref Range   Glucose-Capillary 235 (H) 65 - 99 mg/dL  Basic metabolic panel     Status: Abnormal     Collection Time: 08/14/15  4:37 AM  Result Value Ref Range   Sodium 133 (L) 135 - 145 mmol/L   Potassium 3.9 3.5 - 5.1 mmol/L   Chloride 93 (L) 101 - 111 mmol/L   CO2 24 22 - 32 mmol/L   Glucose, Bld 167 (H) 65 - 99 mg/dL   BUN 58 (H) 6 - 20 mg/dL   Creatinine, Ser 12.28 (H) 0.61 - 1.24 mg/dL   Calcium 8.2 (L) 8.9 - 10.3 mg/dL   GFR calc non Af Amer 4 (L) >60 mL/min   GFR calc Af Amer 5 (L) >60 mL/min   Anion gap 16 (H) 5 - 15  CBC     Status: Abnormal   Collection Time: 08/14/15  4:37 AM  Result Value Ref Range   WBC 11.5 (H) 4.0 - 10.5 K/uL   RBC 3.03 (L) 4.22 - 5.81 MIL/uL   Hemoglobin 9.0 (L) 13.0 - 17.0 g/dL   HCT 28.0 (L) 39.0 - 52.0 %   MCV 92.4 78.0 - 100.0 fL   MCH 29.7 26.0 - 34.0 pg   MCHC 32.1 30.0 - 36.0 g/dL   RDW 17.2 (H) 11.5 - 15.5 %   Platelets 126 (L) 150 - 400 K/uL  Glucose, capillary  Status: Abnormal   Collection Time: 08/14/15  5:58 AM  Result Value Ref Range   Glucose-Capillary 140 (H) 65 - 99 mg/dL      Micro Results: Recent Results (from the past 240 hour(s))  Culture, blood (routine x 2)     Status: None (Preliminary result)   Collection Time: 08/11/15  9:16 AM  Result Value Ref Range Status   Specimen Description BLOOD RIGHT ANTECUBITAL  Final   Special Requests BOTTLES DRAWN AEROBIC AND ANAEROBIC 5CCS  Final   Culture  Setup Time   Final    GRAM POSITIVE COCCI IN CLUSTERS IN BOTH AEROBIC AND ANAEROBIC BOTTLES CRITICAL RESULT CALLED TO, READ BACK BY AND VERIFIED WITH: T WOODS 08/13/15 @ 08012 M VESTAL    Culture GRAM POSITIVE COCCI  Final   Report Status PENDING  Incomplete  Culture, blood (routine x 2)     Status: None (Preliminary result)   Collection Time: 08/11/15  9:20 AM  Result Value Ref Range Status   Specimen Description BLOOD RIGHT ARM  Final   Special Requests BOTTLES DRAWN AEROBIC AND ANAEROBIC 5CCS  Final   Culture  Setup Time   Final    GRAM POSITIVE COCCI IN CLUSTERS IN BOTH AEROBIC AND ANAEROBIC BOTTLES CRITICAL  RESULT CALLED TO, READ BACK BY AND VERIFIED WITH: Alphonse Guild [id number] Vazquez    Culture GRAM POSITIVE COCCI  Final   Report Status PENDING  Incomplete  C difficile quick scan w PCR reflex     Status: None   Collection Time: 08/11/15  7:07 PM  Result Value Ref Range Status   C Diff antigen NEGATIVE NEGATIVE Final   C Diff toxin NEGATIVE NEGATIVE Final   C Diff interpretation Negative for toxigenic C. difficile  Final  MRSA PCR Screening     Status: Abnormal   Collection Time: 08/12/15  2:34 AM  Result Value Ref Range Status   MRSA by PCR POSITIVE (A) NEGATIVE Final    Comment:        The GeneXpert MRSA Assay (FDA approved for NASAL specimens only), is one component of a comprehensive MRSA colonization surveillance program. It is not intended to diagnose MRSA infection nor to guide or monitor treatment for MRSA infections. RESULT CALLED TO, READ BACK BY AND VERIFIED WITH: EDNA GARNETTE MELINGER,RN @0639  08/13/15 MKELLY    Studies/Results: No results found. Medications: I have reviewed the patient's current medications. Scheduled Meds: . [MAR Hold] aspirin EC  81 mg Oral Daily  . [MAR Hold] calcitRIOL  2.5 mcg Oral Q M,W,F-HD  . [MAR Hold] calcium carbonate  4 tablet Oral TID WC  . [MAR Hold] Chlorhexidine Gluconate Cloth  6 each Topical Q0600  . [MAR Hold] heparin  5,000 Units Subcutaneous 3 times per day  . [MAR Hold] insulin aspart  0-9 Units Subcutaneous TID WC  . [MAR Hold] insulin detemir  15 Units Subcutaneous QHS  . [MAR Hold] multivitamin  1 tablet Oral QHS  . [MAR Hold] mupirocin ointment  1 application Nasal BID  . [MAR Hold] pantoprazole  40 mg Oral BID  . [MAR Hold] sodium chloride flush  3 mL Intravenous Q12H  . [MAR Hold] vancomycin  1,000 mg Intravenous Q M,W,F-HD   Continuous Infusions: . sodium chloride Stopped (08/12/15 1003)   PRN Meds:.[MAR Hold] acetaminophen, [MAR Hold] loperamide, [MAR Hold] oxyCODONE Assessment/Plan: Principal Problem:    Sepsis ([place]) Active Problems:   DIASTOLIC DYSFUNCTION   PVD   ESRD on dialysis ([place])   S/P BKA (  below knee amputation) unilateral ([facility])   Diabetes mellitus type 2, uncontrolled ([place])   Fever   Severe sepsis ([place])   Staphylococcus sepsis ([place])   Infection and inflammatory reaction due to cardiac device, implant, and graft ([facility])  GPC bacteremia-  2/2 blood cultures growing GPC, and speciation and sensitivities is pending and he is on IV vanc. MRSA nares was positive. Pt is still spiking fever with Tmax of 101.51F at 8:30pm last night  Leukocytosis has trended down. Likely the source is the graft. VVS is planning to remore HERO graft today and dialysis after that.   - continue IV vanc  - on IV fluids 50 cc/hr  Diabetes-  On levemir 20 units qhs at home. Blood sugars have been stable  - Levemir 15u qhs - SSI  ESRD on HD- MWF.  - nephrology following and dialysis today  -continue renal supplements     Dispo: Disposition is deferred at this time, awaiting improvement of current medical problems.   The patient does have a current PCP Delrae Rend, MD) and does need an [facility] hospital follow-up appointment after discharge.  The patient does not have transportation limitations that hinder transportation to clinic appointments.  .Services Needed at time of discharge: Y = Yes, Blank = No PT:   OT:   RN:   Equipment:   Other:     LOS: 3 days   Burgess Estelle, MD 08/14/2015, 10:12 AM

## 2015-08-14 NOTE — Consult Note (Signed)
PULMONARY / CRITICAL CARE MEDICINE   Name: [name] MRN: [id number] DOB: June 18, 1966    ADMISSION DATE:  08/11/2015 CONSULTATION DATE:  08/14/2015  REFERRING MD:  Dr. Hervey Ard  CHIEF COMPLAINT:  Post-op hypotension  HISTORY OF PRESENT ILLNESS:  50 year old male with PMH as below, which includes HTN, ESRD on HD(MWF), GERD, and DM. He was noted to have a clotted dialysis access at a recent HD treatment and was referred to vascular surgery because of this. Upon presentation to the surgeon's office, he complained of a 2 day history of fever/chills, diarrhea and pain in area of graft. Temperature in office was 101.4. He was referred to ED where he was admitted to the Internal Medicine Teaching Service for sepsis of unclear etiology (although at the time is was strongly considered that HD access was source) and DKA. Temp HD cath was placed at time of admission. Despite broad ABX, fevers persisted and blood cultures grew GBC. With no other potential sources for sepsis identified, plan was to remove HeRO graft for source control. He underwent this procedure 1/30, post operative course complicated by hypotension. PCCM asked to see for further eval. Of note she has been on midodrine this admission for HD related hypotension. Dry weight should be 113kg.   PAST MEDICAL HISTORY :  He  has a past medical history of Hypertension; Chest pain; Morbid obesity ([place]); Bronchitis; Blood transfusion; H/O hiatal hernia; Chronic kidney disease; GERD (gastroesophageal reflux disease); Dysrhythmia; Hyperparathyroidism ([place]); ESRD (end stage renal disease) on dialysis ([place]); Pneumonia; Diabetes mellitus; and Anemia.  PAST SURGICAL HISTORY: He  has past surgical history that includes AV fistula placement (10/11/2010); Ankle Fusion; AV fistula placement (07/02/2011); Thrombectomy; Insertion of dialysis catheter (01/20/2012); Umbilcial Hernia; Parathyroidectomy; Arteriovenous goretex graft removal (Right,  11/06/2012); AV fistula placement (Right, 11/06/2012); Insertion of dialysis catheter (Right, 11/09/2012); venogram (N/A, 01/06/2014); Amputation (Left, 01/20/2015); Amputation (Left, 03/07/2015); and Amputation (Left, 04/14/2015).  No Known Allergies  No current facility-administered medications on file prior to encounter.   Current Outpatient Prescriptions on File Prior to Encounter  Medication Sig  . aspirin EC 325 MG EC tablet Take 1 tablet (325 mg total) by mouth daily.  Marland Kitchen b complex-vitamin c-folic acid (NEPHRO-VITE) 0.8 MG TABS tablet Take 1 tablet by mouth at bedtime.  . calcitRIOL (ROCALTROL) 0.5 MCG capsule Take 5 capsules (2.5 mcg total) by mouth every Monday, Wednesday, and Friday with hemodialysis.  Marland Kitchen calcium carbonate (TUMS - DOSED IN MG ELEMENTAL CALCIUM) 500 MG chewable tablet Chew 4 tablets (800 mg of elemental calcium total) by mouth 3 (three) times daily with meals.  . famotidine (PEPCID) 20 MG tablet Take 1 tablet (20 mg total) by mouth at bedtime.  . insulin detemir (LEVEMIR) 100 UNIT/ML injection Inject 0.2 mLs (20 Units total) into the skin at bedtime.  . methocarbamol (ROBAXIN) 500 MG tablet Take 1 tablet (500 mg total) by mouth every 6 (six) hours as needed for muscle spasms.  Marland Kitchen oxyCODONE (OXY IR/ROXICODONE) 5 MG immediate release tablet Take 1-2 tablets (5-10 mg total) by mouth every 3 (three) hours as needed for breakthrough pain.  . pantoprazole (PROTONIX) 40 MG tablet Take 1 tablet (40 mg total) by mouth 2 (two) times daily.    FAMILY HISTORY:  His indicated that his mother is alive. He indicated that his father is alive.   SOCIAL HISTORY: He  reports that he quit smoking about 7 months ago. His smoking use included Cigarettes. He has a 2 pack-year smoking history.  He has never used smokeless tobacco. He reports that he does not drink alcohol or use illicit drugs.  REVIEW OF SYSTEMS:   Bolds are positive  Constitutional: weight loss, gain, night sweats, Fevers, chills,  fatigue .  HEENT: headaches, Sore throat, sneezing, nasal congestion, post nasal drip, Difficulty swallowing, Tooth/dental problems, visual complaints visual changes, ear ache CV:  chest pain, radiates: ,Orthopnea, PND, swelling in lower extremities, dizziness, palpitations, syncope.  GI  heartburn, indigestion, abdominal pain, nausea, vomiting, diarrhea, change in bowel habits, loss of appetite, bloody stools.  Resp: cough, productive: , hemoptysis, dyspnea, chest pain, pleuritic.  Skin: rash or itching or icterus GU: dysuria, change in color of urine, urgency or frequency. flank pain, hematuria  MS: L arm pain or swelling. decreased range of motion  Psych: change in mood or affect. depression or anxiety.  Neuro: difficulty with speech, weakness, numbness, ataxia    SUBJECTIVE:  I feel much better now  VITAL SIGNS: BP 115/19 mmHg  Pulse 99  Temp(Src) 99.6 F (37.6 C) (Oral)  Resp 12  Ht 6\' 1"  (1.854 m)  Wt 107.98 kg (238 lb 0.8 oz)  BMI 31.41 kg/m2  SpO2 96%  HEMODYNAMICS:    VENTILATOR SETTINGS:    INTAKE / OUTPUT: I/O last 3 completed shifts: In: [id number] [P.O.:1422; IV Piggyback:150] Out: 3 [Stool:3]  PHYSICAL EXAMINATION: General:  Obese male in NAD Neuro:  Alert, oriented, non-focal HEENT:  [place]/AT, PERRL, no JVD noted Cardiovascular:  RRR, no MRG Lungs:  Clear bilateral breath sounds Abdomen:  Soft, non-tender, non-distended. Several chronic punctate hypodense lesions Musculoskeletal:  LLL BKA.  Skin:  Grossly intact  LABS:  BMET  Recent Labs Lab 08/12/15 0331 08/13/15 0438 08/14/15 0437  NA 134* 132* 133*  K 4.1 3.8 3.9  CL 93* 95* 93*  CO2 25 23 24   BUN 58* 40* 58*  CREATININE 13.35* 10.12* 12.28*  GLUCOSE 130* 116* 167*    Electrolytes  Recent Labs Lab 08/12/15 0331 08/13/15 0438 08/14/15 0437  CALCIUM 8.0* 8.1* 8.2*  PHOS 7.2*  --   --     CBC  Recent Labs Lab 08/12/15 0331 08/13/15 0438 08/14/15 0437  WBC 13.1* 12.4* 11.5*  HGB  8.9* 9.4* 9.0*  HCT 28.6* 29.2* 28.0*  PLT 69* 78* 126*    Coag's No results for input(s): APTT, INR in the last 168 hours.  Sepsis Markers  Recent Labs Lab 08/11/15 0931 08/11/15 1332  LATICACIDVEN 2.37* 1.44    ABG No results for input(s): PHART, PCO2ART, PO2ART in the last 168 hours.  Liver Enzymes  Recent Labs Lab 08/11/15 0928 08/12/15 0331  AST 19 17  ALT 15* 14*  ALKPHOS 72 69  BILITOT 1.5* 1.4*  ALBUMIN 3.0* 2.4*    Cardiac Enzymes No results for input(s): TROPONINI, PROBNP in the last 168 hours.  Glucose  Recent Labs Lab 08/13/15 0947 08/13/15 1133 08/13/15 1635 08/13/15 2153 08/14/15 0558 08/14/15 1007  GLUCAP 135* 143* 163* 235* 140* 96    Imaging No results found.   STUDIES:    CULTURES: 1/27 Blood > GPC in clusters >>> C.Dif 1/27 > neg 1/30 Blood >>>  ANTIBIOTICS: Vancomycin 1/27 >>> Zosyn 1/27 > 1/28 Ancef 1/30 >>>  SIGNIFICANT EVENTS: 1/25 onset of sx 1/27 admit, last HD  LINES/TUBES: L IJ HD cath 1/27 >>>   DISCUSSION: 50 year old male with ESRD on HD, admitted 1/27 for sepsis. Source thought to be LUE HeRO graft, which was removed in OR 1/30. Post-op shock requiring  phenylephrine. PCCM to manage in ICU.  ASSESSMENT / PLAN:  PULMONARY A: Hypoxemia  P:   Supplemental O2 to maintain SpO2 > 92% Incentive spirometry  CARDIOVASCULAR A:  Septic shock, suspect volume depletion in post-operative setting also playing a role.   P:  Telemetry MAP goal > 60 or SBP > 90 mmHg (has been hypotensive throughout hospitalization, specifically postHD and on midodrine) Phenylephrine to maintain MAP goal Give gentle volume Assess lactic, troponin Check CVP  RENAL A:   ESRD on HD (MWF - 1/28 was last tx)   P:   Nephrology following Repeat CMP Mag, Phos Gentle volume  GASTROINTESTINAL A:   GERD  P:   Renal, carb mod diet Protonix for SUP  HEMATOLOGIC A:   Anemia of chronic illness Thrombocytopenia  P:   Follow CBC Subcutaneous heparin for VTE prophylaxis  INFECTIOUS A:   Septic shock secondary to HeRO graft infection s/p removal 1/30  P:   ABX and cultures as above PCT algorithm  ENDOCRINE A:   DM 2 Hyperparathyroidism  P:   Levemir 15 units QHS and SSI  NEUROLOGIC A:   Somnolence in post operative setting  P:   RASS goal: 0 Monitor   FAMILY  - Updates: patient updated in PACU by [facility] and PM 1/30  - Inter-disciplinary family meet or Palliative Care meeting due by:  2/6  APP critical care time 40 mins  Georgann Housekeeper, AGACNP-BC [facility] Pulmonology/Critical Care Pager [phone] or [phone]  08/14/2015 5:34 PM

## 2015-08-14 NOTE — Anesthesia Procedure Notes (Signed)
Procedure Name: LMA Insertion Date/Time: 08/14/2015 11:52 AM Performed by: Luciana Axe K Pre-anesthesia Checklist: Patient identified, Emergency Drugs available, Suction available, Patient being monitored and Timeout performed Patient Re-evaluated:Patient Re-evaluated prior to inductionOxygen Delivery Method: Circle system utilized Preoxygenation: Pre-oxygenation with 100% oxygen Intubation Type: IV induction Ventilation: Mask ventilation without difficulty and Oral airway inserted - appropriate to patient size LMA: LMA inserted LMA Size: 5.0 Number of attempts: 1 Placement Confirmation: positive ETCO2,  CO2 detector and breath sounds checked- equal and bilateral Tube secured with: Tape Dental Injury: Teeth and Oropharynx as per pre-operative assessment

## 2015-08-14 NOTE — Op Note (Signed)
    OPERATIVE REPORT  DATE OF SURGERY: 08/14/2015  PATIENT: [name], 50 y.o. male MRN: [id number]  DOB: 1966-06-24  PRE-OPERATIVE DIAGNOSIS: End-stage renal disease with thrombosed and probable infected hero graft  POST-OPERATIVE DIAGNOSIS:  Same  PROCEDURE: Removal of left arm hero graft and vein patch angioplasty brachial artery  SURGEON:  Curt Jews, M.D.  PHYSICIAN ASSISTANT: Collins  ANESTHESIA:  Gen.  EBL: 150 ml  Total I/O In: 650 [I.V.:400; IV Piggyback:250] Out: 150 [Blood:150]  BLOOD ADMINISTERED: None  DRAINS: Penrose drains  SPECIMEN: culture of perigraft fluid  COUNTS CORRECT:  YES  PLAN OF CARE: PACU   PATIENT DISPOSITION:  PACU - hemodynamically stable  PROCEDURE DETAILS: The patient was taken to the operative placed supine position where the area of the left arm left shoulder prepped and draped in sterile fashion. Incision was made over the portion of the graft and then used for access throughout the upper arm. Patient had extensive keloid formation over this. An area of purulence was entered and this was sent for aerobic and anaerobic culture. Other than this area there was another area that was further up toward shoulder also had some cloudy  Fluid. The remainder was densely adherent. Incision was made over the antecubital space and the brachial artery proximal and distal to the Gore-Tex anastomosis was exposed. Several additional incisions were made over the course of the Gore-Tex portion of the hero graft. The incision at the shoulder where the junction to the Silastic catheter was exposed and the Silastic catheter was completely mobilized.  The catheter was removed in its entirety  And pressure was held for hemostasis. The Gore-Tex graft portion was removed in its entirety as well. The patient had a branch of the brachial vein at the antecubital space which was harvested and opened longitudinally for a patch. The brachial artery was occluded  proximal and distal to the Gore-Tex anastomosis and the Gore-Tex was excised in its entirety. The vein was sewn as a patch angioplasty with a running 60 proline suture. Clamps removed and good flow was noted to the hand. Wounds irrigated with saline. Hemostasis tablet cautery. Wounds were closed by first placing a  Penrose drain at the base of the wounds of the shoulder and at the antecubital area. These were brought out through the midportion of the upper arm. The  2 incisions at the shoulder and one the antecubital space were closed with 30 Vicryls in the subcutaneous and subcuticular tissue. The incision in the mid  Forearm was closed with several interrupted#2 nylon figure-of-eight sutures leaving approximately 4 cm open the midportion of this were the purulence event. This was packed with saline. She'll dressing was applied the patient was transferred to the recovery room stable condition   Curt Jews, M.D. 08/14/2015 2:47 PM

## 2015-08-14 NOTE — Progress Notes (Signed)
Pharmacy Antibiotic Note  [name] is a 50 y.o. male admitted on 08/11/2015 with tachycardia and fever and found to be septic. Currently growing 2/2 staph aureus. Pharmacy has been consulted for cefazolin dosing. He is already of empiric IV Vancomycin. WBC down to 12.3. Tm 102.7.   Patient has a history of CKD on M/W/F HD. Last HD session was on Saturday. Planning HD this evening.   Plan: -Start Cefazolin 2 gm IV Q HD. Will give first dose now  -Monitor cultures for de-escalation   Height: 6\' 1"  (185.4 cm) Weight: 238 lb 0.8 oz (107.98 kg) IBW/kg (Calculated) : 79.9  Temp (24hrs), Avg:99.3 F (37.4 C), Min:97.8 F (36.6 C), Max:101.1 F (38.4 C)   Recent Labs Lab 08/11/15 0928 08/11/15 0931 08/11/15 1332 08/12/15 0331 08/13/15 0438 08/14/15 0437  WBC 20.2*  --   --  13.1* 12.4* 11.5*  CREATININE 12.61*  --   --  13.35* 10.12* 12.28*  LATICACIDVEN  --  2.37* 1.44  --   --   --     Estimated Creatinine Clearance: 9.2 mL/min (by C-G formula based on Cr of 12.28).    No Known Allergies  Antimicrobials this admission: Vanc 1/27 >>  Zosyn 1/27 >>1/29 Cefazolin 1/30>>  Dose adjustments this admission: None   Microbiology results: 1/27 BCx: 2/2 staph aureus  1/28 MRSA PCR positive 1/27 Cdiff negative  Thank you for allowing pharmacy to be a part of this patient's care.  Albertina Parr, PharmD., BCPS Clinical Pharmacist Pager [phone]

## 2015-08-14 NOTE — Progress Notes (Signed)
Patient seen and examined. Case d/w residents in detail. I agree with findings and plan as documented in Dr. Sherlynn Carbon note.  Patient with fevers overnight to 101 F. Blood cx with GPC 2/2. Will f/u speciation and sensitivities. WIll f/u repeat blood cx. Likely source is infected AVG. HERO graft removal done by vascular sx today. Will c/w IV vancomycin for now. Leukocytosis resolving. C/w HD per nephrology.

## 2015-08-15 ENCOUNTER — Encounter (HOSPITAL_COMMUNITY): Payer: Self-pay | Admitting: Vascular Surgery

## 2015-08-15 DIAGNOSIS — A412 Sepsis due to unspecified staphylococcus: Secondary | ICD-10-CM

## 2015-08-15 DIAGNOSIS — E1129 Type 2 diabetes mellitus with other diabetic kidney complication: Secondary | ICD-10-CM | POA: Diagnosis not present

## 2015-08-15 DIAGNOSIS — N186 End stage renal disease: Secondary | ICD-10-CM | POA: Diagnosis not present

## 2015-08-15 DIAGNOSIS — Y838 Other surgical procedures as the cause of abnormal reaction of the patient, or of later complication, without mention of misadventure at the time of the procedure: Secondary | ICD-10-CM

## 2015-08-15 DIAGNOSIS — Z992 Dependence on renal dialysis: Secondary | ICD-10-CM | POA: Diagnosis not present

## 2015-08-15 DIAGNOSIS — B9561 Methicillin susceptible Staphylococcus aureus infection as the cause of diseases classified elsewhere: Secondary | ICD-10-CM

## 2015-08-15 DIAGNOSIS — T827XXD Infection and inflammatory reaction due to other cardiac and vascular devices, implants and grafts, subsequent encounter: Secondary | ICD-10-CM

## 2015-08-15 LAB — GLUCOSE, CAPILLARY
Glucose-Capillary: 155 mg/dL — ABNORMAL HIGH (ref 65–99)
Glucose-Capillary: 122 mg/dL — ABNORMAL HIGH (ref 65–99)
Glucose-Capillary: 135 mg/dL — ABNORMAL HIGH (ref 65–99)
Glucose-Capillary: 168 mg/dL — ABNORMAL HIGH (ref 65–99)

## 2015-08-15 LAB — CULTURE, BLOOD (ROUTINE X 2)

## 2015-08-15 LAB — TROPONIN I
TROPONIN I: 0.04 ng/mL — AB (ref <0.031)
Troponin I: <0.03 ng/mL (ref <0.031)

## 2015-08-15 MED ORDER — MIDODRINE HCL 5 MG PO TABS
10.0000 mg | ORAL_TABLET | Freq: Three times a day (TID) | ORAL | Status: DC
  Administered 2015-08-15 – 2015-08-19 (×12): 10 mg via ORAL
  Filled 2015-08-15 (×11): qty 2

## 2015-08-15 MED ORDER — CALCITRIOL 0.5 MCG PO CAPS
ORAL_CAPSULE | ORAL | Status: AC
  Administered 2015-08-15: 2.5 ug via ORAL
  Filled 2015-08-15: qty 5

## 2015-08-15 NOTE — Progress Notes (Signed)
ANTIBIOTIC CONSULT NOTE - INITIAL  Pharmacy Consult for Vancomycin Indication: MRSA bacteremia  No Known Allergies  Patient Measurements: Height: 6\' 1"  (185.4 cm) Weight: 238 lb 1.6 oz (108 kg) IBW/kg (Calculated) : 79.9 Adjusted Body Weight:    Vital Signs: Temp: 101 F (38.3 C) (01/31 0738) Temp Source: Oral (01/31 0738) BP: 119/53 mmHg (01/31 0815) Pulse Rate: 96 (01/31 0821) Intake/Output from previous day: 01/30 0701 - 01/31 0700 In: 2357.5 [P.O.:120; I.V.:1187.5; IV Piggyback:1050] Out: 2650 [Blood:150] Intake/Output from this shift: Total I/O In: 328 [I.V.:328] Out: -   Labs:  Recent Labs  08/13/15 0438 08/14/15 0437 08/14/15 1815 08/14/15 1919  WBC 12.4* 11.5* 13.0*  --   HGB 9.4* 9.0* 8.6*  --   PLT 78* 126* 161  --   CREATININE 10.12* 12.28*  --  13.60*   Estimated Creatinine Clearance: 8.3 mL/min (by C-G formula based on Cr of 13.6). No results for input(s): VANCOTROUGH, VANCOPEAK, VANCORANDOM, GENTTROUGH, GENTPEAK, GENTRANDOM, TOBRATROUGH, TOBRAPEAK, TOBRARND, AMIKACINPEAK, AMIKACINTROU, AMIKACIN in the last 72 hours.   Microbiology:   Medical History: Past Medical History  Diagnosis Date  . Hypertension   . Chest pain   . Morbid obesity ([place])   . Bronchitis   . Blood transfusion   . H/O hiatal hernia   . Chronic kidney disease     dialysis, [facility]-F, [facility]  . GERD (gastroesophageal reflux disease)   . Dysrhythmia     irregular heartbeat  . Hyperparathyroidism ([place])   . ESRD (end stage renal disease) on dialysis ([place])     first HD 06/19/04  . Pneumonia   . Diabetes mellitus     type 2  . Anemia     Assessment: 50 yo M presents on 1/27 with tachycardia, fever, chills. Found to be a code sepsis. Pharmacy consulted for abx. Tmax up to 101.3 and labs pending. PMH includes ESRD with HD on MWF.   Infectious Disease: MrSA bacteremia from infected HD graft. WBC 11.5>13 yesterday. Temp 101. LA 2.37 > 1.44. ESRD.Thrombosed HD graft removed  1/30. HD last last PM.  Ancef 1/30>> Vanc 1/27 >>  Zosyn 1/27 >>1/29  1/30: BC x 2: GPC x 2 1/27 BCx: MRSA 1/28 MRSA PCR positive 1/27 Cdiff negative   Goal of Therapy:  Vanco trough <20 before redosing post-HD  Plan:  Vanc 1 g MWF per regular HD schedule  Monitor clinical picture, renal function, VT prn F/U changes in HD schedule   Chandria Rookstool S. Alford Highland, PharmD, [facility] Clinical Staff Pharmacist Pager [phone]  Eilene Ghazi Stillinger 08/15/2015,11:10 AM

## 2015-08-15 NOTE — Progress Notes (Signed)
PULMONARY / CRITICAL CARE MEDICINE   Name: [name] MRN: [id number] DOB: [birth year]    ADMISSION DATE:  08/11/2015 CONSULTATION DATE:  08/14/2015  REFERRING MD:  Dr. Hervey Ard  CHIEF COMPLAINT:  Post-op hypotension  HISTORY OF PRESENT ILLNESS:  50 year old male with PMH as below, which includes HTN, ESRD on HD(MWF), GERD, and DM. He was noted to have a clotted dialysis access at a recent HD treatment and was referred to vascular surgery because of this. Upon presentation to the surgeon's office, he complained of a 2 day history of fever/chills, diarrhea and pain in area of graft. Temperature in office was 101.4. He was referred to ED where he was admitted to the Internal Medicine Teaching Service for sepsis of unclear etiology (although at the time is was strongly considered that HD access was source) and DKA. Temp HD cath was placed at time of admission. Despite broad ABX, fevers persisted and blood cultures grew GBC. With no other potential sources for sepsis identified, plan was to remove HeRO graft for source control. He underwent this procedure 1/30, post operative course complicated by hypotension. PCCM asked to see for further eval. Of note she has been on midodrine this admission for HD related hypotension. Dry weight should be 113kg.  SUBJECTIVE:  Up to chair, awake On phenylephrine 35 currently Received HD last night with volume removal while on pressors  VITAL SIGNS: BP 119/53 mmHg  Pulse 96  Temp(Src) 101 F (38.3 C) (Oral)  Resp 6  Ht 6\' 1"  (1.854 m)  Wt 108 kg (238 lb 1.6 oz)  BMI 31.42 kg/m2  SpO2 100%  HEMODYNAMICS: CVP:  [6 mmHg-7 mmHg] 6 mmHg  VENTILATOR SETTINGS:    INTAKE / OUTPUT: I/O last 3 completed shifts: In: 2597.5 [P.O.:360; I.V.:1187.5; IV Piggyback:1050] Out: 2652 [Other:2500; [place]; Blood:150]  PHYSICAL EXAMINATION: General:  Obese male in NAD Neuro:  Alert, oriented, non-focal HEENT:  [place]/AT, PERRL, no JVD  noted Cardiovascular:  RRR, no MRG Lungs:  Clear bilateral breath sounds Abdomen:  Soft, non-tender, non-distended. Several chronic punctate hypodense lesions Musculoskeletal:  LLL BKA.  Skin:  Grossly intact  LABS:  BMET  Recent Labs Lab 08/13/15 0438 08/14/15 0437 08/14/15 1919  NA 132* 133* 135  K 3.8 3.9 4.3  CL 95* 93* 95*  CO2 23 24 23   BUN 40* 58* 64*  CREATININE 10.12* 12.28* 13.60*  GLUCOSE 116* 167* 95    Electrolytes  Recent Labs Lab 08/12/15 0331 08/13/15 0438 08/14/15 0437 08/14/15 1919  CALCIUM 8.0* 8.1* 8.2* 7.8*  PHOS 7.2*  --   --   --     CBC  Recent Labs Lab 08/13/15 0438 08/14/15 0437 08/14/15 1815  WBC 12.4* 11.5* 13.0*  HGB 9.4* 9.0* 8.6*  HCT 29.2* 28.0* 26.1*  PLT 78* 126* 161    Coag's No results for input(s): APTT, INR in the last 168 hours.  Sepsis Markers  Recent Labs Lab 08/11/15 0931 08/11/15 1332 08/14/15 1814  LATICACIDVEN 2.37* 1.44 0.6    ABG No results for input(s): PHART, PCO2ART, PO2ART in the last 168 hours.  Liver Enzymes  Recent Labs Lab 08/11/15 0928 08/12/15 0331  AST 19 17  ALT 15* 14*  ALKPHOS 72 69  BILITOT 1.5* 1.4*  ALBUMIN 3.0* 2.4*    Cardiac Enzymes  Recent Labs Lab 08/14/15 1815 08/15/15 0041 08/15/15 0600  TROPONINI <0.03 0.04* <0.03    Glucose  Recent Labs Lab 08/13/15 1635 08/13/15 2153 08/14/15 0558 08/14/15 1007 08/14/15  1946 08/14/15 2322  GLUCAP 163* 235* 140* 96 91 126*    Imaging No results found.   STUDIES:    CULTURES: MRSA screen >> positive 1/27 Blood > S aureus >>> C.Dif 1/27 > neg 1/30 Blood >>> Wound 1/30 >>    ANTIBIOTICS: Vancomycin 1/27 >>> Zosyn 1/27 > 1/28 Ancef 1/30 >>>  SIGNIFICANT EVENTS: 1/25 onset of sx 1/27 admit, last HD 1/30 HD access surgically removed  LINES/TUBES: L IJ HD cath 1/27 >>>   DISCUSSION: 50 year old male with ESRD on HD, admitted 1/27 for sepsis. Source thought to be LUE HeRO graft, which was  removed in OR 1/30. Post-op shock requiring phenylephrine. PCCM to manage in ICU.  ASSESSMENT / PLAN:  PULMONARY A: Hypoxemia, resolved  P:   Supplemental prn O2 to maintain SpO2 > 92% Incentive spirometry  CARDIOVASCULAR A:  Septic shock, suspect volume depletion in post-operative setting also playing a role. Hx of chronic hypotension (on midodrine) and ? Reliable BP readings on lower leg.   P:  Telemetry Phenylephrine, Goal SBP > 85 mmHg if no change in MS, will need to tolerate a lower MAP as is his baseline CVP per PICC  RENAL A:   ESRD on HD (MWF - 1/30 pm was last tx)   P:   Nephrology following Plan remove temp catheter after his next HD, then place a more permanent HD cath  GASTROINTESTINAL A:   GERD  P:   Renal, carb mod diet Protonix for SUP  HEMATOLOGIC A:   Anemia of chronic illness Thrombocytopenia  P:  Follow CBC Subcutaneous heparin for VTE prophylaxis  INFECTIOUS A:   Septic shock secondary to HeRO graft infection s/p removal 1/30 S aureus bacteremia, sensitivities pending  P:   ABX and cultures as above, narrow when sensitivities available.   ENDOCRINE A:   DM 2 Hyperparathyroidism  P:   Levemir 15 units QHS and SSI  NEUROLOGIC A:   Somnolence in post operative setting, resolved  P:   RASS goal: 0 Monitor   FAMILY  - Updates: patient updated in ICU 1/31 am  - Inter-disciplinary family meet or Palliative Care meeting due by:  2/6  Independent CC time 31 minutes.   Baltazar Apo, MD, PhD 08/15/2015, 9:13 AM [facility] [phone] or if no answer [phone]

## 2015-08-15 NOTE — Care Management Note (Signed)
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  Has cane, walker, and wheelchair, has not been fitted with prosthetic leg yet (appointment was today).                                 Expected Discharge Plan:  Home/Self Care  Discharge planning Services  CM Consult  Status of Service:  In process, will continue to follow  Girard Cooter, RN 08/15/2015, 12:34 PM

## 2015-08-15 NOTE — Progress Notes (Signed)
CRITICAL VALUE ALERT  Critical value received:  Positive blood cultures (gram positive cocci clusters)  Date of notification:  08/15/15  Time of notification:  0923  Critical value read back: yes  Nurse who received alert:  Henrietta Dine RN  MD notified (1st page):  Dr. Lamonte Sakai  Time of first page:  0927  Responding MD:  Dr. Lamonte Sakai  Time MD responded:  [phone]  No new orders at this time

## 2015-08-15 NOTE — Progress Notes (Signed)
Utilization Review Completed.  

## 2015-08-15 NOTE — Progress Notes (Addendum)
[facility] a little better.   Objective 125/43 95 100.7 F (38.2 C) (Oral) 13 99%  Intake/Output Summary (Last 24 hours) at 08/15/15 0718 Last data filed at 08/15/15 0300  Gross per 24 hour  Intake 2357.5 ml  Output   2650 ml  Net -292.5 ml    Left upper arm dressing in place Palpable radial pulse and sensation intact on the left UE  Assessment/Planning: POD # 1 Removal HERO graft Sepsis On vancomycin and ancef IV antibiotics Wound and blood cultures pending from 08/14/2015 Troponin's <0.30, 0.04, and <0.03. Hypotension continues currently on Neo 50 mcg BP 120/30 Patient received dialysis last night   Laurence [facility] 08/15/2015 7:18 AM --  Laboratory Lab Results:  Recent Labs  08/14/15 0437 08/14/15 1815  WBC 11.5* 13.0*  HGB 9.0* 8.6*  HCT 28.0* 26.1*  PLT 126* 161   BMET  Recent Labs  08/14/15 0437 08/14/15 1919  NA 133* 135  K 3.9 4.3  CL 93* 95*  CO2 24 23  GLUCOSE 167* 95  BUN 58* 64*  CREATININE 12.28* 13.60*  CALCIUM 8.2* 7.8*    COAG Lab Results  Component Value Date   INR 1.16 04/14/2015   INR 1.15 03/07/2015   INR 1.15 01/20/2015   No results found for: PTT    I have examined the patient, reviewed and agree with above. Patient reports his arm feels much better. Will begin normal saline wet-to-dry dressings to his left arm  Curt Jews, MD 08/15/2015 8:49 AM

## 2015-08-15 NOTE — Consult Note (Addendum)
[facility] for Infectious Disease       Reason for Consult: Staph bacteremia    Referring Physician: [facility]/Dr. Lamonte Sakai  Principal Problem:   Sepsis ([place]) Active Problems:   DIASTOLIC DYSFUNCTION   PVD   ESRD on dialysis ([place])   S/P BKA (below knee amputation) unilateral (HCC)   Diabetes mellitus type 2, uncontrolled ([place])   Fever   Severe sepsis ([place])   Staphylococcus sepsis ([place])   Infection and inflammatory reaction due to cardiac device, implant, and graft (HCC)   Septic shock (HCC)   Bacteremia   Esophageal reflux   . aspirin EC  81 mg Oral Daily  . calcitRIOL  2.5 mcg Oral Q M,W,F-HD  . calcium carbonate  4 tablet Oral TID WC  .  ceFAZolin (ANCEF) IV  2 g Intravenous Q M,W,F-HD  . Chlorhexidine Gluconate Cloth  6 each Topical Q0600  . heparin  5,000 Units Subcutaneous 3 times per day  . insulin aspart  0-9 Units Subcutaneous TID WC  . insulin detemir  15 Units Subcutaneous QHS  . midodrine  10 mg Oral TID WC  . multivitamin  1 tablet Oral QHS  . mupirocin ointment  1 application Nasal BID  . pantoprazole  40 mg Oral BID  . sodium chloride flush  3 mL Intravenous Q12H  . vancomycin  1,000 mg Intravenous Q M,W,F-HD    Recommendations: Continue with vancomycin and cefazolin pending sensitivities Repeat blood cultures again tomorrow TTE  Routine HIV and hep C testing  Dr. Baxter Flattery to follow up tomorrow  Assessment: He has Staph bacteremia.   Now s/p HERO graft removal.    Antibiotics: Vancomycin and cefazolin.   HPI: [name] is a 50 y.o. male with ESRD on HD who presented 1/27 with fever, chills and thrombosed access 2 days prior to admission.  Blood cultures subsequently grew Staph aureus and repeat blood cultures also positive. Had HERO graft removed yesterday.  Feels better since admission and less chills, particularly since graft removal.  WBC stable.    Review of Systems:  Constitutional: negative for chills and  fatigue Gastrointestinal: negative for diarrhea Integument/breast: negative for rash All other systems reviewed and are negative   Past Medical History  Diagnosis Date  . Hypertension   . Chest pain   . Morbid obesity ([place])   . Bronchitis   . Blood transfusion   . H/O hiatal hernia   . Chronic kidney disease     dialysis, [facility]-F, [facility]  . GERD (gastroesophageal reflux disease)   . Dysrhythmia     irregular heartbeat  . Hyperparathyroidism ([place])   . ESRD (end stage renal disease) on dialysis ([place])     first HD 06/19/04  . Pneumonia   . Diabetes mellitus     type 2  . Anemia     Social History  Substance Use Topics  . Smoking status: Former Smoker -- 0.10 packs/day for 20 years    Types: Cigarettes    Quit date: 01/13/2015  . Smokeless tobacco: Never Used  . Alcohol Use: No    Family History  Problem Relation Age of Onset  . Heart disease Mother   . Kidney disease Mother   . Hypertension Mother     No Known Allergies  Physical Exam: Constitutional: in no apparent distress and alert  Filed Vitals:   08/15/15 0815 08/15/15 0821  BP: 119/53   Pulse: 102 96  Temp:    Resp: 28 6  EYES: anicteric ENMT: no thrush Cardiovascular: Cor RRR and No murmurs Respiratory: CTA B; normal respiratory effort GI: Bowel sounds are normal, liver is not enlarged, spleen is not enlarged Musculoskeletal: no pedal edema noted Skin: negatives: no rash Hematologic: no cervical lad  Lab Results  Component Value Date   WBC 13.0* 08/14/2015   HGB 8.6* 08/14/2015   HCT 26.1* 08/14/2015   MCV 92.2 08/14/2015   PLT 161 08/14/2015    Lab Results  Component Value Date   CREATININE 13.60* 08/14/2015   BUN 64* 08/14/2015   NA 135 08/14/2015   K 4.3 08/14/2015   CL 95* 08/14/2015   CO2 23 08/14/2015    Lab Results  Component Value Date   ALT 14* 08/12/2015   AST 17 08/12/2015   ALKPHOS 69 08/12/2015     Microbiology: Recent Results (from the past 240 hour(s))   Culture, blood (routine x 2)     Status: None   Collection Time: 08/11/15  9:16 AM  Result Value Ref Range Status   Specimen Description BLOOD RIGHT ANTECUBITAL  Final   Special Requests BOTTLES DRAWN AEROBIC AND ANAEROBIC 5CCS  Final   Culture  Setup Time   Final    GRAM POSITIVE COCCI IN CLUSTERS IN BOTH AEROBIC AND ANAEROBIC BOTTLES CRITICAL RESULT CALLED TO, READ BACK BY AND VERIFIED WITH: T WOODS 08/13/15 @ [id number] M VESTAL    Culture   Final    STAPHYLOCOCCUS AUREUS SUSCEPTIBILITIES PERFORMED ON PREVIOUS CULTURE WITHIN THE LAST 5 DAYS.    Report Status 08/15/2015 FINAL  Final  Culture, blood (routine x 2)     Status: None   Collection Time: 08/11/15  9:20 AM  Result Value Ref Range Status   Specimen Description BLOOD RIGHT ARM  Final   Special Requests BOTTLES DRAWN AEROBIC AND ANAEROBIC 5CCS  Final   Culture  Setup Time   Final    GRAM POSITIVE COCCI IN CLUSTERS IN BOTH AEROBIC AND ANAEROBIC BOTTLES CRITICAL RESULT CALLED TO, READ BACK BY AND VERIFIED WITH: Alphonse Guild [id number] [place]    Culture METHICILLIN RESISTANT STAPHYLOCOCCUS AUREUS  Final   Report Status 08/15/2015 FINAL  Final   Organism ID, Bacteria METHICILLIN RESISTANT STAPHYLOCOCCUS AUREUS  Final      Susceptibility   Methicillin resistant staphylococcus aureus - MIC*    CIPROFLOXACIN >=8 RESISTANT Resistant     ERYTHROMYCIN >=8 RESISTANT Resistant     GENTAMICIN <=0.5 SENSITIVE Sensitive     OXACILLIN >=4 RESISTANT Resistant     TETRACYCLINE <=1 SENSITIVE Sensitive     VANCOMYCIN <=0.5 SENSITIVE Sensitive     TRIMETH/SULFA <=10 SENSITIVE Sensitive     CLINDAMYCIN <=0.25 SENSITIVE Sensitive     RIFAMPIN <=0.5 SENSITIVE Sensitive     Inducible Clindamycin NEGATIVE Sensitive     * METHICILLIN RESISTANT STAPHYLOCOCCUS AUREUS  C difficile quick scan w PCR reflex     Status: None   Collection Time: 08/11/15  7:07 PM  Result Value Ref Range Status   C Diff antigen NEGATIVE NEGATIVE Final   C Diff toxin  NEGATIVE NEGATIVE Final   C Diff interpretation Negative for toxigenic C. difficile  Final  MRSA PCR Screening     Status: Abnormal   Collection Time: 08/12/15  2:34 AM  Result Value Ref Range Status   MRSA by PCR POSITIVE (A) NEGATIVE Final    Comment:        The GeneXpert MRSA Assay (FDA approved for NASAL specimens only), is one component of  a comprehensive MRSA colonization surveillance program. It is not intended to diagnose MRSA infection nor to guide or monitor treatment for MRSA infections. RESULT CALLED TO, READ BACK BY AND VERIFIED WITH: [place] @0639  08/13/15 MKELLY   Surgical pcr screen     Status: Abnormal   Collection Time: 08/14/15  6:11 AM  Result Value Ref Range Status   MRSA, PCR POSITIVE (A) NEGATIVE Final    Comment: RESULT CALLED TO, READ BACK BY AND VERIFIED WITH: L. [facility] RN 11:00 08/14/15 ([email or web address])    Staphylococcus aureus POSITIVE (A) NEGATIVE Final    Comment:        The Xpert SA Assay (FDA approved for NASAL specimens in patients over 55 years of age), is one component of a comprehensive surveillance program.  Test performance has been validated by [facility] for patients greater than or equal to 61 year old. It is not intended to diagnose infection nor to guide or monitor treatment.   Anaerobic culture     Status: None (Preliminary result)   Collection Time: 08/14/15 12:19 PM  Result Value Ref Range Status   Specimen Description WOUND ARM LEFT  Final   Special Requests PERIGRAFT/PT ON VANC  Final   Gram Stain   Final    FEW WBC PRESENT,BOTH PMN AND MONONUCLEAR NO SQUAMOUS EPITHELIAL CELLS SEEN NO ORGANISMS SEEN Performed at [facility]    Culture PENDING  Incomplete   Report Status PENDING  Incomplete  Wound culture     Status: None (Preliminary result)   Collection Time: 08/14/15 12:19 PM  Result Value Ref Range Status   Specimen Description WOUND ARM LEFT  Final   Special Requests PERIGRAFT/PT ON VANC   Final   Gram Stain   Final    FEW WBC PRESENT,BOTH PMN AND MONONUCLEAR NO SQUAMOUS EPITHELIAL CELLS SEEN NO ORGANISMS SEEN Performed at [facility]    Culture   Final    Culture reincubated for better growth Performed at [facility]    Report Status PENDING  Incomplete  Culture, blood (Routine X 2) w Reflex to ID Panel     Status: None (Preliminary result)   Collection Time: 08/14/15  3:45 PM  Result Value Ref Range Status   Specimen Description BLOOD RIGHT HAND  Final   Special Requests BOTTLES DRAWN AEROBIC ONLY 2CC  Final   Culture  Setup Time   Final    GRAM POSITIVE COCCI IN CLUSTERS PEDIATRIC BOTTLE CRITICAL RESULT CALLED TO, READ BACK BY AND VERIFIED WITH: C. FOWLER,RN AT [id number] ON [id number] BY Rhea Bleacher    Culture PENDING  Incomplete   Report Status PENDING  Incomplete  Culture, blood (Routine X 2) w Reflex to ID Panel     Status: None (Preliminary result)   Collection Time: 08/14/15  4:00 PM  Result Value Ref Range Status   Specimen Description BLOOD RIGHT HAND  Final   Special Requests BOTTLES DRAWN AEROBIC ONLY [place]  Final   Culture  Setup Time   Final    GRAM POSITIVE COCCI IN CLUSTERS PEDIATRIC BOTTLE CRITICAL RESULT CALLED TO, READ BACK BY AND VERIFIED WITH: CJessy Oto AT [id number] ON [id number] BY Rhea Bleacher    Culture PENDING  Incomplete   Report Status PENDING  Incomplete    Scharlene Gloss, [email or web address] [phone] pager  [phone] cell 08/15/2015, 9:43 AM

## 2015-08-15 NOTE — Progress Notes (Signed)
[facility] Progress Note   Subjective: S/P removal of HERO- now in ICU for post op hypotension also fever- had HD after OR with 3 liters off- says he feels a lot better   Filed Vitals:   08/15/15 0745 08/15/15 0800 08/15/15 0815 08/15/15 0821  BP: 117/64 109/95 119/53   Pulse: 93 92 102 96  Temp:      TempSrc:      Resp: 12 14 28 6   Height:      Weight:      SpO2: 99% 100% 99% 100%    Inpatient medications: . aspirin EC  81 mg Oral Daily  . calcitRIOL  2.5 mcg Oral Q M,W,F-HD  . calcium carbonate  4 tablet Oral TID WC  .  ceFAZolin (ANCEF) IV  2 g Intravenous Q M,W,F-HD  . Chlorhexidine Gluconate Cloth  6 each Topical Q0600  . heparin  5,000 Units Subcutaneous 3 times per day  . insulin aspart  0-9 Units Subcutaneous TID WC  . insulin detemir  15 Units Subcutaneous QHS  . multivitamin  1 tablet Oral QHS  . mupirocin ointment  1 application Nasal BID  . pantoprazole  40 mg Oral BID  . sodium chloride flush  3 mL Intravenous Q12H  . vancomycin  1,000 mg Intravenous Q M,W,F-HD   . sodium chloride 50 mL/hr at 08/15/15 0800  . phenylephrine (NEO-SYNEPHRINE) Adult infusion 35 mcg/min (08/15/15 0835)   sodium chloride, sodium chloride, acetaminophen, alteplase, heparin, HYDROmorphone (DILAUDID) injection, lidocaine (PF), lidocaine-prilocaine, loperamide, oxyCODONE, pentafluoroprop-tetrafluoroeth  Exam: Alert, no distress  No jvd Chest clear bilat RRR no mrg Abd soft obese ntnd no ascites Ext R Charcot foot, L BKA trace LE edema Arm bandaged  Neuro alert nf ox 3 Temp L IJ HD cath- placed  1/27  CXR 1/27 no acute   Dialysis: [facility]  4.5h  110.5kg   2/2 bath  Heparin 4500/ 1800 midRx  LUE HeRO- now out Calc 0.5 ug tiw Mircera 50 q2, next 2/1  Venofer 50/wk Hb 11.2  Ca/P - 8/10.5 pth 529  Assessment: 1 Fever/ gram +bacteremia - in ESRD patient w HeRO access - now s/p removal.  The temporary left IJ HD cath placed 1/27.  Have d/w VVS, plan OR today   for full HeRO access removal (goretex and silastic segments)- WBC improving- on ancef and vanc 2 ESRD MWF HD- got done Saturday and Monday- no needs today- has temp cath in- plan to leave in and dialyze through it on Wednesday- then remove- give brief line holiday and plan for [facility] on Friday ? Will discuss the plan with VVS  3 Volume - seems euvolemic- was started on midodrine when his dry wt was lowered after BKA. Problem is that I am not sure blood pressure is accurate. On admit was 117kg w clear CXR. With low sodium raises issue of volume- has big gains as OP- planning to wean his pressors today   5 Anemia Hb 10.2- 8.6 after surgery , esa due wed- will follow- transfuse as needed  6 MBD cont meds- TUMS/calcitriol- phos notoriously not well controlled  7 DM2 8 PAD L BKA  [name] A   08/15/2015, 8:47 AM    Recent Labs Lab 08/12/15 0331 08/13/15 0438 08/14/15 0437 08/14/15 1919  NA 134* 132* 133* 135  K 4.1 3.8 3.9 4.3  CL 93* 95* 93* 95*  CO2 25 23 24 23   GLUCOSE 130* 116* 167* 95  BUN 58* 40* 58* 64*  CREATININE 13.35* 10.12* 12.28* 13.60*  CALCIUM 8.0* 8.1* 8.2* 7.8*  PHOS 7.2*  --   --   --     Recent Labs Lab 08/11/15 0928 08/12/15 0331  AST 19 17  ALT 15* 14*  ALKPHOS 72 69  BILITOT 1.5* 1.4*  PROT 7.6 6.8  ALBUMIN 3.0* 2.4*    Recent Labs Lab 08/11/15 0928  08/13/15 0438 08/14/15 0437 08/14/15 1815  WBC 20.2*  < > 12.4* 11.5* 13.0*  NEUTROABS 17.8*  --   --   --  8.7*  HGB 10.2*  < > 9.4* 9.0* 8.6*  HCT 32.6*  < > 29.2* 28.0* 26.1*  MCV 94.8  < > 94.2 92.4 92.2  PLT 71*  < > 78* 126* 161  < > = values in this interval not displayed.

## 2015-08-15 NOTE — Plan of Care (Signed)
Problem: Fluid Volume: Goal: Hemodynamic stability will improve Outcome: Progressing Patient is on Neo at 60 mcg currently. CVP reading was 7. Patient received dialysis last night. Oxycodone 10 mg given, partial pain relief, thus IV dilaudid given once for pain. Pain on left arm relieved after the IV dilaudid.   Problem: Physical Regulation: Goal: Diagnostic test results will improve Patient is on 5 liters of oxygen by nasal cannula.  Goal: Signs and symptoms of infection will decrease Outcome: Progressing Patient's temp spiked up to 100.7.

## 2015-08-16 ENCOUNTER — Ambulatory Visit (HOSPITAL_COMMUNITY): Payer: Medicare Other

## 2015-08-16 ENCOUNTER — Encounter (HOSPITAL_COMMUNITY): Payer: Self-pay | Admitting: *Deleted

## 2015-08-16 DIAGNOSIS — I514 Myocarditis, unspecified: Secondary | ICD-10-CM

## 2015-08-16 DIAGNOSIS — T8112XD Postprocedural septic shock, subsequent encounter: Secondary | ICD-10-CM

## 2015-08-16 DIAGNOSIS — A4102 Sepsis due to Methicillin resistant Staphylococcus aureus: Secondary | ICD-10-CM

## 2015-08-16 LAB — HIV ANTIBODY (ROUTINE TESTING W REFLEX): HIV Screen 4th Generation wRfx: NONREACTIVE

## 2015-08-16 LAB — CBC WITH DIFFERENTIAL/PLATELET
BASOS PCT: 0 %
Basophils Absolute: 0.1 10*3/uL (ref 0.0–0.1)
EOS ABS: 1.1 10*3/uL — AB (ref 0.0–0.7)
EOS PCT: 9 %
HCT: 24 % — ABNORMAL LOW (ref 39.0–52.0)
Hemoglobin: 7.7 g/dL — ABNORMAL LOW (ref 13.0–17.0)
LYMPHS ABS: 1.7 10*3/uL (ref 0.7–4.0)
Lymphocytes Relative: 14 %
MCH: 30.7 pg (ref 26.0–34.0)
MCHC: 32.1 g/dL (ref 30.0–36.0)
MCV: 95.6 fL (ref 78.0–100.0)
Monocytes Absolute: 1.1 10*3/uL — ABNORMAL HIGH (ref 0.1–1.0)
Monocytes Relative: 10 %
Neutro Abs: 7.7 10*3/uL (ref 1.7–7.7)
Neutrophils Relative %: 67 %
PLATELETS: 270 10*3/uL (ref 150–400)
RBC: 2.51 MIL/uL — ABNORMAL LOW (ref 4.22–5.81)
RDW: 17.8 % — AB (ref 11.5–15.5)
WBC: 11.6 10*3/uL — AB (ref 4.0–10.5)

## 2015-08-16 LAB — RENAL FUNCTION PANEL
ALBUMIN: 2.2 g/dL — AB (ref 3.5–5.0)
Anion gap: 13 (ref 5–15)
BUN: 40 mg/dL — AB (ref 6–20)
CALCIUM: 8.4 mg/dL — AB (ref 8.9–10.3)
CO2: 24 mmol/L (ref 22–32)
CREATININE: 10.03 mg/dL — AB (ref 0.61–1.24)
Chloride: 100 mmol/L — ABNORMAL LOW (ref 101–111)
GFR calc Af Amer: 6 mL/min — ABNORMAL LOW (ref >60)
GFR, EST NON AFRICAN AMERICAN: 5 mL/min — AB (ref >60)
Glucose, Bld: 115 mg/dL — ABNORMAL HIGH (ref 65–99)
PHOSPHORUS: 7.1 mg/dL — AB (ref 2.5–4.6)
Potassium: 4.2 mmol/L (ref 3.5–5.1)
Sodium: 137 mmol/L (ref 135–145)

## 2015-08-16 LAB — PREPARE RBC (CROSSMATCH)

## 2015-08-16 LAB — CULTURE, BLOOD (ROUTINE X 2)

## 2015-08-16 LAB — GLUCOSE, CAPILLARY
GLUCOSE-CAPILLARY: 79 mg/dL (ref 65–99)
GLUCOSE-CAPILLARY: 114 mg/dL — AB (ref 65–99)
Glucose-Capillary: 99 mg/dL (ref 65–99)

## 2015-08-16 LAB — VANCOMYCIN, RANDOM: VANCOMYCIN RM: 27 ug/mL

## 2015-08-16 MED ORDER — CALCITRIOL 0.5 MCG PO CAPS
ORAL_CAPSULE | ORAL | Status: AC
  Administered 2015-08-16: 12:00:00
  Filled 2015-08-16: qty 5

## 2015-08-16 MED ORDER — SODIUM CHLORIDE 0.9 % IV SOLN: Freq: Once | INTRAVENOUS | Status: DC

## 2015-08-16 MED ORDER — MIDODRINE HCL 5 MG PO TABS
ORAL_TABLET | ORAL | Status: AC
  Administered 2015-08-16: 12:00:00
  Filled 2015-08-16: qty 2

## 2015-08-16 MED ORDER — DARBEPOETIN ALFA 150 MCG/0.3ML IJ SOSY
150.0000 ug | PREFILLED_SYRINGE | INTRAMUSCULAR | Status: DC
  Administered 2015-08-16: 150 ug via INTRAVENOUS
  Filled 2015-08-16: qty 0.3

## 2015-08-16 MED ORDER — PERFLUTREN LIPID MICROSPHERE
1.0000 mL | INTRAVENOUS | Status: AC | PRN
  Administered 2015-08-16: 3 mL via INTRAVENOUS
  Filled 2015-08-16: qty 10

## 2015-08-16 MED ORDER — BISACODYL 5 MG PO TBEC: 10.0000 mg | DELAYED_RELEASE_TABLET | Freq: Every day | ORAL | Status: DC | PRN

## 2015-08-16 MED ORDER — DARBEPOETIN ALFA 150 MCG/0.3ML IJ SOSY
PREFILLED_SYRINGE | INTRAMUSCULAR | Status: AC
  Administered 2015-08-16: 12:00:00
  Filled 2015-08-16: qty 0.3

## 2015-08-16 NOTE — Progress Notes (Signed)
Pharmacy Antibiotic Note  [name] is a 50 y.o. male admitted on 08/11/2015 with bacteremia.  Pharmacy has been consulted for vancomycin dosing. Afebrile now but Tmax was 101 yesterday. WBC slightly elevated at 11.6. VR > 4 hrs after HD was slightly high at 27 (15-25) If tolerating full HD sessions current vanc dosing will still be ok  Plan for next HD on Friday.  Plan: Continue vancomycin 1g IV QHD-MWF Monitor clinical picture, HD sessions, pre-HD VR F/U C&S, abx deescalation / LOT   Height: 6\' 1"  (185.4 cm) Weight: 241 lb 10 oz (109.6 kg) IBW/kg (Calculated) : 79.9  Temp (24hrs), Avg:98.3 F (36.8 C), Min:97.3 F (36.3 C), Max:98.9 F (37.2 C)   Recent Labs Lab 08/11/15 0931 08/11/15 1332 08/12/15 0331 08/13/15 0438 08/14/15 0437 08/14/15 1814 08/14/15 1815 08/14/15 1919 08/16/15 0926 08/16/15 1844  WBC  --   --  13.1* 12.4* 11.5*  --  13.0*  --  11.6*  --   CREATININE  --   --  13.35* 10.12* 12.28*  --   --  13.60* 10.03*  --   LATICACIDVEN 2.37* 1.44  --   --   --  0.6  --   --   --   --   VANCORANDOM  --   --   --   --   --   --   --   --   --  27    Estimated Creatinine Clearance: 11.3 mL/min (by C-G formula based on Cr of 10.03).    No Known Allergies  Antimicrobials this admission: Vanc 1/27 >> Zosyn 1/27 >>1/29 Cefazolin 1/31 >>  Dose adjustments this admission: 2/1 VR = 27  Microbiology results: 1/27 CDiff >> neg 1/27 BCx >> MRSA (Vanc MIC </= 0.5) 1/28 MRSA PCR >> positive 1/30 WCx >> moderate SA 1/30 BCx >> GS with GPC in clusters 1/31 BCx >>  Thank you for allowing pharmacy to be a part of this patient's care.  Elenor Quinones, PharmD, BCPS Clinical Pharmacist Pager [phone] 08/16/2015 7:36 PM

## 2015-08-16 NOTE — Progress Notes (Signed)
[facility] for Infectious Disease    Date of Admission:  08/11/2015   Total days of antibiotics 6        Day 6 vanco   ID: [name] is a 50 y.o. male with ESRD on HD who presented 1/27 with fever, chills and thrombosed access 2 days prior to admission. Blood cultures + MRSA s/p HERO graft removal on 1/30.   Principal Problem:   Sepsis ([place]) Active Problems:   DIASTOLIC DYSFUNCTION   PVD   ESRD on dialysis ([place])   S/P BKA (below knee amputation) unilateral ([facility])   Diabetes mellitus type 2, uncontrolled ([place])   Fever   Severe sepsis ([place])   Staphylococcus sepsis ([place])   Infection and inflammatory reaction due to cardiac device, implant, and graft ([place])   Septic shock ([place])   Bacteremia   Esophageal reflux    Subjective: Feeling better. Last fever was 1/31 at 7am @ 101F. Remains afebrile x 24 hr+  Interval hx: undergoing HD thru temp IJ HD catheter, tolerated HD without difficulty Medications:  . aspirin EC  81 mg Oral Daily  . calcitRIOL  2.5 mcg Oral Q M,W,F-HD  . calcium carbonate  4 tablet Oral TID WC  . Chlorhexidine Gluconate Cloth  6 each Topical Q0600  . heparin  5,000 Units Subcutaneous 3 times per day  . insulin aspart  0-9 Units Subcutaneous TID WC  . insulin detemir  15 Units Subcutaneous QHS  . midodrine  10 mg Oral TID WC  . multivitamin  1 tablet Oral QHS  . mupirocin ointment  1 application Nasal BID  . pantoprazole  40 mg Oral BID  . sodium chloride flush  3 mL Intravenous Q12H  . vancomycin  1,000 mg Intravenous Q M,W,F-HD    Objective: Vital signs in last 24 hours: Temp:  [97.3 F (36.3 C)-98.7 F (37.1 C)] 97.3 F (36.3 C) (02/01 0841) Pulse Rate:  [82-98] 83 (02/01 0920) Resp:  [5-22] 20 (02/01 0841) BP: (90-188)/(33-85) 116/64 mmHg (02/01 0920) SpO2:  [83 %-99 %] 99 % (02/01 0841) Weight:  [239 lb (108.41 kg)-244 lb 4.3 oz (110.8 kg)] 244 lb 4.3 oz (110.8 kg) (02/01 0841) Physical Exam  Constitutional: He is oriented to  person, place, and time. He appears well-developed and well-nourished. No distress.  HENT:  Mouth/Throat: Oropharynx is clear and moist. No oropharyngeal exudate.  Neck/chest wall: bandaged site from catheter removal. Also has left IJ HD cath in place Cardiovascular: Normal rate, regular rhythm and normal heart sounds. Exam reveals no gallop and no friction rub.  No murmur heard.  Pulmonary/Chest: Effort normal and breath sounds normal. No respiratory distress. He has no wheezes.  Abdominal: Soft. Bowel sounds are normal. He exhibits no distension. There is no tenderness.  Lymphadenopathy:  He has no cervical adenopathy.  Neurological: He is alert and oriented to person, place, and time.  Skin: Skin is warm and dry. No rash noted. No erythema. Numerous keloid scars on forearms Psychiatric: flat affect    Lab Results  Recent Labs  08/14/15 0437 08/14/15 1815 08/14/15 1919  WBC 11.5* 13.0*  --   HGB 9.0* 8.6*  --   HCT 28.0* 26.1*  --   NA 133*  --  135  K 3.9  --  4.3  CL 93*  --  95*  CO2 24  --  23  BUN 58*  --  64*  CREATININE 12.28*  --  13.60*    Microbiology: 1/27 blood  cx MRSA 1/30 blood cx staph aureus sensi pending 1/30 periwound cx staph aureus sensi pending Studies/Results: 1/28 CXR per my read possible RUL infiltrate  Assessment/Plan: Complicated MRSA bacteremia 2/2 infected vascular graft s/p HeRO graft removal on 1/30 = currently on vancomycin. Plan to continue for minimum of 4 wks.  1) recommend to get TTE to evaluate for endocarditis, may need TEE pending results  2) will repeat blood cx today to document clearance of bacteremia  [facility], [facility] for Infectious Diseases Cell: [phone] Pager: [phone]  08/16/2015, 9:30 AM

## 2015-08-16 NOTE — Procedures (Signed)
Patient was seen on dialysis and the procedure was supervised.  BFR 400  Via vascath BP is  116/64 .   Patient appears to be tolerating treatment well  [name] A 08/16/2015

## 2015-08-16 NOTE — Progress Notes (Signed)
PULMONARY / CRITICAL CARE MEDICINE   Name: [name] MRN: [id number] DOB: May 27, 1966    ADMISSION DATE:  08/11/2015 CONSULTATION DATE:  08/14/2015  REFERRING MD:  Dr. Hervey Ard  CHIEF COMPLAINT:  Post-op hypotension  HISTORY OF PRESENT ILLNESS:  50 year old male with PMH as below, which includes HTN, ESRD on HD(MWF), GERD, and DM. He was noted to have a clotted dialysis access at a recent HD treatment and was referred to vascular surgery because of this. Upon presentation to the surgeon's office, he complained of a 2 day history of fever/chills, diarrhea and pain in area of graft. Temperature in office was 101.4. He was referred to ED where he was admitted to the Internal Medicine Teaching Service for sepsis of unclear etiology (although at the time is was strongly considered that HD access was source) and DKA. Temp HD cath was placed at time of admission. Despite broad ABX, fevers persisted and blood cultures grew GBC. With no other potential sources for sepsis identified, plan was to remove HeRO graft for source control. He underwent this procedure 1/30, post operative course complicated by hypotension. PCCM asked to see for further eval. Of note she has been on midodrine this admission for HD related hypotension. Dry weight should be 113kg.  SUBJECTIVE:  Pressors off Has refused his protonix Up to chair - to HD today off the unit  VITAL SIGNS: BP 131/67 mmHg  Pulse 84  Temp(Src) 97.9 F (36.6 C) (Axillary)  Resp 15  Ht 6\' 1"  (1.854 m)  Wt 108.41 kg (239 lb)  BMI 31.54 kg/m2  SpO2 92%  HEMODYNAMICS: CVP:  [4 mmHg-7 mmHg] 5 mmHg  VENTILATOR SETTINGS:    INTAKE / OUTPUT: I/O last 3 completed shifts: In: 3160.1 [P.O.:600; I.V.:2310.1; IV Piggyback:250] Out: 2500 [Other:2500]  PHYSICAL EXAMINATION: General:  Obese male in NAD Neuro:  Alert, oriented, non-focal HEENT:  [place]/AT, PERRL, no JVD noted Cardiovascular:  RRR, no MRG Lungs:  Clear bilateral breath  sounds Abdomen:  Soft, non-tender, non-distended. Several chronic punctate hypodense lesions Musculoskeletal:  LLL BKA.  Skin:  Grossly intact  LABS:  BMET  Recent Labs Lab 08/13/15 0438 08/14/15 0437 08/14/15 1919  NA 132* 133* 135  K 3.8 3.9 4.3  CL 95* 93* 95*  CO2 23 24 23   BUN 40* 58* 64*  CREATININE 10.12* 12.28* 13.60*  GLUCOSE 116* 167* 95    Electrolytes  Recent Labs Lab 08/12/15 0331 08/13/15 0438 08/14/15 0437 08/14/15 1919  CALCIUM 8.0* 8.1* 8.2* 7.8*  PHOS 7.2*  --   --   --     CBC  Recent Labs Lab 08/13/15 0438 08/14/15 0437 08/14/15 1815  WBC 12.4* 11.5* 13.0*  HGB 9.4* 9.0* 8.6*  HCT 29.2* 28.0* 26.1*  PLT 78* 126* 161    Coag's No results for input(s): APTT, INR in the last 168 hours.  Sepsis Markers  Recent Labs Lab 08/11/15 0931 08/11/15 1332 08/14/15 1814  LATICACIDVEN 2.37* 1.44 0.6    ABG No results for input(s): PHART, PCO2ART, PO2ART in the last 168 hours.  Liver Enzymes  Recent Labs Lab 08/11/15 0928 08/12/15 0331  AST 19 17  ALT 15* 14*  ALKPHOS 72 69  BILITOT 1.5* 1.4*  ALBUMIN 3.0* 2.4*    Cardiac Enzymes  Recent Labs Lab 08/14/15 1815 08/15/15 0041 08/15/15 0600  TROPONINI <0.03 0.04* <0.03    Glucose  Recent Labs Lab 08/14/15 1946 08/14/15 2322 08/15/15 0727 08/15/15 1203 08/15/15 1642 08/15/15 2155  GLUCAP 91  126* 155* 122* 135* 168*    Imaging No results found.   STUDIES:    CULTURES: MRSA screen >> positive 1/27 Blood > S aureus >>> MRSA C.Dif 1/27 > neg 1/30 Blood >>> Wound 1/30 >> S aureus >>    ANTIBIOTICS: Vancomycin 1/27 >>> Zosyn 1/27 > 1/28 Ancef 1/30 >>>  SIGNIFICANT EVENTS: 1/25 onset of sx 1/27 admit, last HD 1/30 HD access surgically removed  LINES/TUBES: L IJ HD cath 1/27 >>>   DISCUSSION: 50 year old male with ESRD on HD, admitted 1/27 for sepsis. Source thought to be LUE HeRO graft, which was removed in OR 1/30. Post-op shock requiring  phenylephrine. PCCM to manage in ICU.  ASSESSMENT / PLAN:  PULMONARY A: Hypoxemia, resolved  P:   Supplemental prn O2 to maintain SpO2 > 92% Incentive spirometry  CARDIOVASCULAR A:  Septic shock, suspect volume depletion in post-operative setting also playing a role. Hx of chronic hypotension (on midodrine) and ? Reliable BP readings on lower leg.   P:  Telemetry Phenylephrine weaned to off Treat underlying infxn as below  RENAL A:   ESRD on HD (MWF - 1/30 pm was last tx)   P:   Nephrology following Plan remove temp catheter after his HD 2/1, then place a more permanent HD cath  GASTROINTESTINAL A:   GERD  P:   Renal, carb mod diet Protonix for SUP  HEMATOLOGIC A:   Anemia of chronic illness Thrombocytopenia  P:  Follow CBC Subcutaneous heparin for VTE prophylaxis  INFECTIOUS A:   Septic shock secondary to HeRO graft infection s/p removal 1/30 S aureus bacteremia >> MRSA confirmed from blood cx 1/27  P:   ABX and cultures as above. Should be able to change to vanco alone today, I will defer this to Dr Linus Salmons / Dr Baxter Flattery HIV and Hep C ordered and pending  ENDOCRINE A:   DM 2 Hyperparathyroidism  P:   Levemir 15 units QHS and SSI  NEUROLOGIC A:   Somnolence in post operative setting, resolved  P:   RASS goal: 0 Monitor  Will transfer to floor bed and back to IMTS as of 2/2   Baltazar Apo, MD, PhD 08/16/2015, 8:28 AM [facility] [phone] or if no answer [phone]

## 2015-08-16 NOTE — Progress Notes (Signed)
IMTS to resume care the morning of 08/17/15.

## 2015-08-16 NOTE — Progress Notes (Signed)
[facility] Progress Note   Subjective: Seen in HD- off pressors- no fever- all cultures seem to be MRSA   Filed Vitals:   08/16/15 0600 08/16/15 0700 08/16/15 0800 08/16/15 0841  BP: 131/67  144/69 187/60  Pulse: 88 84 88 87  Temp:  97.9 F (36.6 C)  97.3 F (36.3 C)  TempSrc:  Axillary  Oral  Resp: 15  20 20   Height:      Weight:    110.8 kg (244 lb 4.3 oz)  SpO2: 94% 92% 92% 99%    Inpatient medications: . aspirin EC  81 mg Oral Daily  . calcitRIOL  2.5 mcg Oral Q M,W,F-HD  . calcium carbonate  4 tablet Oral TID WC  .  ceFAZolin (ANCEF) IV  2 g Intravenous Q M,W,F-HD  . Chlorhexidine Gluconate Cloth  6 each Topical Q0600  . heparin  5,000 Units Subcutaneous 3 times per day  . insulin aspart  0-9 Units Subcutaneous TID WC  . insulin detemir  15 Units Subcutaneous QHS  . midodrine  10 mg Oral TID WC  . multivitamin  1 tablet Oral QHS  . mupirocin ointment  1 application Nasal BID  . pantoprazole  40 mg Oral BID  . sodium chloride flush  3 mL Intravenous Q12H  . vancomycin  1,000 mg Intravenous Q M,W,F-HD   . sodium chloride Stopped (08/16/15 0700)  . phenylephrine (NEO-SYNEPHRINE) Adult infusion Stopped (08/15/15 1137)   sodium chloride, sodium chloride, acetaminophen, alteplase, heparin, HYDROmorphone (DILAUDID) injection, lidocaine (PF), lidocaine-prilocaine, loperamide, oxyCODONE, pentafluoroprop-tetrafluoroeth  Exam: Alert, no distress  No jvd Chest clear bilat RRR no mrg Abd soft obese ntnd no ascites Ext R Charcot foot, L BKA trace LE edema Arm bandaged  Neuro alert nf ox 3 Temp L IJ HD cath- placed  1/27  CXR 1/27 no acute   Dialysis: [facility]  4.5h  110.5kg   2/2 bath  Heparin 4500/ 1800 midRx  LUE HeRO- now out Calc 0.5 ug tiw Mircera 50 q2, next 2/1  Venofer 50/wk Hb 11.2  Ca/P - 8/10.5 pth 529  Assessment: 1 Fever/ gram +bacteremia - in ESRD patient w HeRO access - now s/p removal.  The temporary left IJ HD cath placed 1/27. s/p  full HeRO access removal on  Monday-  WBC improving- on ancef and vanc- cx now MRSA- will simplify to vanc- ID wants a TTE 2 ESRD MWF HD- got done Saturday and Monday-has temp cath in- use today - then remove later in day- give brief line holiday and plan for [facility] on Friday followed by HD - Have discussed the plan with VVS  3 Volume - seems euvolemic- was started on midodrine when his dry wt was lowered after BKA. Problem is that I am not sure blood pressure is accurate. On admit was 117kg w clear CXR. With low sodium raises issue of volume- has big gains as OP- planning to wean his pressors today   5 Anemia Hb 10.2- 8.6 after surgery , esa due today - will follow- transfuse as needed  6 MBD cont meds- TUMS/calcitriol- phos notoriously not well controlled as OP  7 DM2 8 PAD L BKA  [name] A   08/16/2015, 9:17 AM    Recent Labs Lab 08/12/15 0331 08/13/15 0438 08/14/15 0437 08/14/15 1919  NA 134* 132* 133* 135  K 4.1 3.8 3.9 4.3  CL 93* 95* 93* 95*  CO2 25 23 24 23   GLUCOSE 130* 116* 167* 95  BUN 58*  40* 58* 64*  CREATININE 13.35* 10.12* 12.28* 13.60*  CALCIUM 8.0* 8.1* 8.2* 7.8*  PHOS 7.2*  --   --   --     Recent Labs Lab 08/11/15 0928 08/12/15 0331  AST 19 17  ALT 15* 14*  ALKPHOS 72 69  BILITOT 1.5* 1.4*  PROT 7.6 6.8  ALBUMIN 3.0* 2.4*    Recent Labs Lab 08/11/15 0928  08/13/15 0438 08/14/15 0437 08/14/15 1815  WBC 20.2*  < > 12.4* 11.5* 13.0*  NEUTROABS 17.8*  --   --   --  8.7*  HGB 10.2*  < > 9.4* 9.0* 8.6*  HCT 32.6*  < > 29.2* 28.0* 26.1*  MCV 94.8  < > 94.2 92.4 92.2  PLT 71*  < > 78* 126* 161  < > = values in this interval not displayed.

## 2015-08-16 NOTE — Progress Notes (Signed)
Echocardiogram 2D Echocardiogram with Definity has been performed.  Tresa Res 08/16/2015, 4:08 PM

## 2015-08-16 NOTE — Progress Notes (Signed)
Patient ID: [name], male   DOB: October 02, 1965, 50 y.o.   MRN: [id number] Patient had an uneventful hemodialysis via his temporary catheter was left IJ. There are orders for the this to be discontinued this afternoon. I took down the dressing from his left arm. I advanced the Penrose drains from proximally and distally and the open area in his mid arm. No evidence of purulence in the wound appears to be healing.   Cultures from the operating room at 48 hours show a white blood cells on the micro-but no growth so far.  Fever to 101 yesterday morning at 7 AM and no fever since.  Continue local wound care to the left arm and advance Penrose drains daily. Patient is planned for cuffed long-term dialysis catheter on Friday, 08/17/2014

## 2015-08-17 LAB — CBC
HEMATOCRIT: 26 % — AB (ref 39.0–52.0)
HEMOGLOBIN: 8.5 g/dL — AB (ref 13.0–17.0)
MCH: 31.1 pg (ref 26.0–34.0)
MCHC: 32.7 g/dL (ref 30.0–36.0)
MCV: 95.2 fL (ref 78.0–100.0)
Platelets: 333 10*3/uL (ref 150–400)
RBC: 2.73 MIL/uL — ABNORMAL LOW (ref 4.22–5.81)
RDW: 17.7 % — ABNORMAL HIGH (ref 11.5–15.5)
WBC: 12 10*3/uL — ABNORMAL HIGH (ref 4.0–10.5)

## 2015-08-17 LAB — WOUND CULTURE

## 2015-08-17 LAB — BASIC METABOLIC PANEL
ANION GAP: 14 (ref 5–15)
BUN: 27 mg/dL — ABNORMAL HIGH (ref 6–20)
CO2: 25 mmol/L (ref 22–32)
Calcium: 8.7 mg/dL — ABNORMAL LOW (ref 8.9–10.3)
Chloride: 93 mmol/L — ABNORMAL LOW (ref 101–111)
Creatinine, Ser: 7.1 mg/dL — ABNORMAL HIGH (ref 0.61–1.24)
GFR calc Af Amer: 9 mL/min — ABNORMAL LOW (ref >60)
GFR calc non Af Amer: 8 mL/min — ABNORMAL LOW (ref >60)
GLUCOSE: 262 mg/dL — AB (ref 65–99)
POTASSIUM: 4.1 mmol/L (ref 3.5–5.1)
Sodium: 132 mmol/L — ABNORMAL LOW (ref 135–145)

## 2015-08-17 LAB — TYPE AND SCREEN
ABO/RH(D): A POS
Antibody Screen: NEGATIVE
Unit division: 0

## 2015-08-17 LAB — GLUCOSE, CAPILLARY
GLUCOSE-CAPILLARY: 154 mg/dL — AB (ref 65–99)
GLUCOSE-CAPILLARY: 160 mg/dL — AB (ref 65–99)
GLUCOSE-CAPILLARY: 182 mg/dL — AB (ref 65–99)
Glucose-Capillary: 183 mg/dL — ABNORMAL HIGH (ref 65–99)
Glucose-Capillary: 175 mg/dL — ABNORMAL HIGH (ref 65–99)

## 2015-08-17 LAB — HEPATITIS C ANTIBODY (REFLEX): HCV AB: 0.2 {s_co_ratio} (ref 0.0–0.9)

## 2015-08-17 LAB — HCV COMMENT:

## 2015-08-17 MED ORDER — DEXTROSE 5 % IV SOLN
1.5000 g | INTRAVENOUS | Status: AC
  Administered 2015-08-18: 1.5 g via INTRAVENOUS
  Filled 2015-08-17 (×2): qty 1.5

## 2015-08-17 NOTE — Progress Notes (Signed)
Interval History: [name] is a 50 year old woman with ESRD on HD and DM who was admitted on 08/11/15 with sepsis (AVG likely source).  AVG removed on 01/30.  He developed refractory hypotension post-op and was subsequently transferred to ICU and started on pressors.  Pressors have been weaned off, afebrile for 48 hours (since AM of 01/31), WBC down from 20 and slowly resolving on vancomycin for MRSA bacteremia.  Subjective: [name] was seen and examined this AM.  He says he feels better and denies chills, decreased appetite, N/V or diarrhea.  He tells me HD went fine yesterday and that his temporary catheter was removed afterwards.  He understands that he will get access re-established tomorrow before HD.  I told him that his ECHO did not show infection in the heart but that he will likely need another type of ECHO to get a better look.   Objective: Vital signs in last 24 hours: Filed Vitals:   08/17/15 0000 08/17/15 0346 08/17/15 0400 08/17/15 0500  BP: 146/72  112/54   Pulse: 91  84   Temp:  99.6 F (37.6 C)    TempSrc:  Oral    Resp:      Height:      Weight:    242 lb 1 oz (109.8 kg)  SpO2: 96%  97%    Weight change: 5 lb 4.3 oz (2.39 kg)  Intake/Output Summary (Last 24 hours) at 08/17/15 0755 Last data filed at 08/17/15 0400  Gross per 24 hour  Intake   1015 ml  Output   2501 ml  Net  -1486 ml   General: resting in bed in NAD HEENT: [place]/AT, site where left IJ temp cath was is bandaged w/ small amt of blood Cardiac: RRR, no rubs, murmurs or gallops Pulm: clear to auscultation bilaterally, moving normal volumes of air Abd: soft, nontender, nondistended, BS present Ext: LUE bandage has just been replaced.  It is c/d/i.  Left BKA, warm and well perfused, no pedal edema Neuro: alert and oriented X3, responding appropriately  Lab Results: Basic Metabolic Panel:  Recent Labs Lab 08/12/15 0331  08/16/15 0926 08/17/15 0230  NA 134*  < > 137 132*  K 4.1  < > 4.2  4.1  CL 93*  < > 100* 93*  CO2 25  < > 24 25  GLUCOSE 130*  < > 115* 262*  BUN 58*  < > 40* 27*  CREATININE 13.35*  < > 10.03* 7.10*  CALCIUM 8.0*  < > 8.4* 8.7*  PHOS 7.2*  --  7.1*  --   < > = values in this interval not displayed. CBC:  Recent Labs Lab 08/14/15 1815 08/16/15 0926 08/17/15 0230  WBC 13.0* 11.6* 12.0*  NEUTROABS 8.7* 7.7  --   HGB 8.6* 7.7* 8.5*  HCT 26.1* 24.0* 26.0*  MCV 92.2 95.6 95.2  PLT 161 270 333   CBG:  Recent Labs Lab 08/15/15 1642 08/15/15 2155 08/16/15 0753 08/16/15 1345 08/16/15 1609 08/16/15 2128  GLUCAP 135* 168* 99 79 114* 183*   Medications: I have reviewed the patient's current medications. Scheduled Meds: . sodium chloride   Intravenous Once  . aspirin EC  81 mg Oral Daily  . calcitRIOL  2.5 mcg Oral Q M,W,F-HD  . calcium carbonate  4 tablet Oral TID WC  . Chlorhexidine Gluconate Cloth  6 each Topical Q0600  . darbepoetin (ARANESP) injection - DIALYSIS  150 mcg Intravenous Q Wed-HD  . heparin  5,000 Units Subcutaneous 3 times per day  . insulin aspart  0-9 Units Subcutaneous TID WC  . insulin detemir  15 Units Subcutaneous QHS  . midodrine  10 mg Oral TID WC  . multivitamin  1 tablet Oral QHS  . mupirocin ointment  1 application Nasal BID  . pantoprazole  40 mg Oral BID  . sodium chloride flush  3 mL Intravenous Q12H  . vancomycin  1,000 mg Intravenous Q M,W,F-HD  Continuous Infusions:  none PRN Meds:.sodium chloride, sodium chloride, acetaminophen, alteplase, bisacodyl, heparin, HYDROmorphone (DILAUDID) injection, lidocaine (PF), lidocaine-prilocaine, loperamide, oxyCODONE, pentafluoroprop-tetrafluoroeth   Abx: Zosyn 01/27 - 08/13/15 Vancomycin started 01/27 - day 7  Assessment/Plan: 50 year old with DM and ESRD on HD here with MRSA bacteremia 2/2 AVG who developed post-op hypotension and was transferred to ICU for pressor support.  He is now off pressors and improving on appropriate abx.  MRSA bacteremia:  Sepsis  resolved.  Infectious source (AVG was removed) on 08/14/15 and surgical perigraft specimen is growing staph aureus.  He is on vancomycin day 7.  2D ECHO has been completed and did not reveal any vegetations.  ID, vascular and nephrology are following and their recommendations are appreciated.   - continue vancomycin x 4 weeks (from date of AVG removal which would mean a stop date 09/11/15) - TEE to ensure no vegetations since TTE was negative (will need to coordinate with his vascular procedure and HD tomorrow; TEE may have to be delayed); presence of IE would require extending duration of vancomycin - follow-up repeat blood cx from 08/16/15 to ensure clearance - continue contact precautions  ESRD on HD:  Continue HD per nephrology.  VVS plans for tunneled catheter placement tomorrow AM.  DM type 2:  No A1c in our system since 2009 but it looks like he follows with Dr. Buddy Duty (on separate EMR).  CBGs have been 79-183 in last 24 hours on Lantus 15 units qHS and SSI-S.   - continue Lantus 15 units qHS and SSI-S; careful titration since he has ESRD - CBGs ac/hs and if hypoglycemic symptoms  Chronic systolic and diastolic HF:  2D ECHO obtained this admission reveals EF of 30-35% and grade 1 diastolic dysfunction.  We have no previous ECHOs in our system so unclear if this is a new diagnosis.  Currently appears euvolemic and receiving HD per nephrology.  Would recommend initiation of low dose Coreg (not dialyzable) once he has completed above procedures and if still hemodynamically stable.   - continue HD as above for volume - add low dose BB (if nephro) in agreement and if/when patient can tolerate; careful to avoid severe hypotension - work-up for etiology deferred until resolution of acute illness and above procedure completed; likely as outpatient  Dispo: Disposition is deferred at this time, awaiting improvement of current medical problems.  Anticipated discharge in approximately 2-3 day(s).   The  patient does have a current PCP Delrae Rend, MD) and does not know need an [facility] hospital follow-up appointment after discharge.  The patient does not know have transportation limitations that hinder transportation to clinic appointments.  .Services Needed at time of discharge: Y = Yes, Blank = No PT:   OT:   RN:   Equipment:   Other:     LOS: 6 days   Francesca Oman, DO 08/17/2015, 7:55 AM

## 2015-08-17 NOTE — Progress Notes (Signed)
08/17/2015 1640 Report called to RN Carlyn Reichert on 6E. Questions and concerns addressed. Pt. Transferred to [phone] via [place] and wheelchair. [name], [name]

## 2015-08-17 NOTE — Progress Notes (Signed)
CRITICAL VALUE ALERT  Critical value received:  Positive wound culture Left Arm MRSA  Date of notification:  08/17/2015   Time of notification:  10:17 AM   Critical value read back: yes   Nurse who received alert:  Monalisa Bayless, [name]   MD notified (1st page):  A. Wilson MD   Time of first page:  10:18 AM   MD notified (2nd page):  Time of second page:  Responding MD:  A. Wilson MD  Time MD responded:  10:18 AM

## 2015-08-17 NOTE — Progress Notes (Signed)
Patient ID: [name], male   DOB: January 22, 1966, 50 y.o.   MRN: [id number] Comfortable this morning. Had temporary catheter removed yesterday. No fevers.  LAN tunneled catheter tomorrow in the operating room. Discussed with [name]. Continue local wound care and advancement of Penrose drain

## 2015-08-17 NOTE — Progress Notes (Signed)
[facility] Progress Note   Subjective: No new c/o this AM- had temp HD cath removed yesterday- tmax in the 99's    Filed Vitals:   08/17/15 0000 08/17/15 0346 08/17/15 0400 08/17/15 0500  BP: 146/72  112/54   Pulse: 91  84   Temp:  99.6 F (37.6 C)    TempSrc:  Oral    Resp:      Height:      Weight:    109.8 kg (242 lb 1 oz)  SpO2: 96%  97%     Inpatient medications: . sodium chloride   Intravenous Once  . aspirin EC  81 mg Oral Daily  . calcitRIOL  2.5 mcg Oral Q M,W,F-HD  . calcium carbonate  4 tablet Oral TID WC  . Chlorhexidine Gluconate Cloth  6 each Topical Q0600  . darbepoetin (ARANESP) injection - DIALYSIS  150 mcg Intravenous Q Wed-HD  . heparin  5,000 Units Subcutaneous 3 times per day  . insulin aspart  0-9 Units Subcutaneous TID WC  . insulin detemir  15 Units Subcutaneous QHS  . midodrine  10 mg Oral TID WC  . multivitamin  1 tablet Oral QHS  . mupirocin ointment  1 application Nasal BID  . pantoprazole  40 mg Oral BID  . sodium chloride flush  3 mL Intravenous Q12H  . vancomycin  1,000 mg Intravenous Q M,W,F-HD   . sodium chloride Stopped (08/16/15 0700)  . phenylephrine (NEO-SYNEPHRINE) Adult infusion Stopped (08/15/15 1137)   sodium chloride, sodium chloride, acetaminophen, alteplase, bisacodyl, heparin, HYDROmorphone (DILAUDID) injection, lidocaine (PF), lidocaine-prilocaine, loperamide, oxyCODONE, pentafluoroprop-tetrafluoroeth  Exam: Alert, no distress  No jvd Chest clear bilat RRR no mrg Abd soft obese ntnd no ascites Ext R Charcot foot, L BKA trace LE edema Arm bandaged  Neuro alert nf ox 3 Temp L IJ HD cath- placed  1/27- now removed  CXR 1/27 no acute   D[facility]: [facility]  4.5h  110.5kg   2/2 bath  Heparin 4500/ 1800 midRx  LUE HeRO- now out Calc 0.5 ug tiw Mircera 50 q2, next 2/1  Venofer 50/wk Hb 11.2  Ca/P - 8/10.5 pth 529  Assessment: 1 MRSA bacteremia - in ESRD patient w HeRO access - now s/p removal.  The  temporary left IJ HD cath placed 1/27 also now removed.  WBC improving- on  vanc- - ID wants a TTE 2 ESRD MWF HD- on schedule- done Wed thru temp cath -and plan for PC on Friday (tomorrow)  followed by HD - Have discussed the plan with [facility]  3 Volume - seems euvolemic- was started on midodrine when his dry wt was lowered after BKA. Problem is that I am not sure blood pressure is accurate. With low sodium raises issue of volume- has big gains as OP- will challenge with HD on Friday as able 5 Anemia Hb 10.2- 8's to 7's  after surgery , esa given 2/1 - will follow- transfuse as needed  6 MBD cont meds- TUMS/calcitriol- phos notoriously not well controlled as OP  7 DM2 8 PAD L BKA  Tene Gato A   08/17/2015, 7:23 AM    Recent Labs Lab 08/12/15 0331  08/14/15 1919 08/16/15 0926 08/17/15 0230  NA 134*  < > 135 137 132*  K 4.1  < > 4.3 4.2 4.1  CL 93*  < > 95* 100* 93*  CO2 25  < > 23 24 25   GLUCOSE 130*  < > 95 115* 262*  BUN 58*  < >  64* 40* 27*  CREATININE 13.35*  < > 13.60* 10.03* 7.10*  CALCIUM 8.0*  < > 7.8* 8.4* 8.7*  PHOS 7.2*  --   --  7.1*  --   < > = values in this interval not displayed.  Recent Labs Lab 08/11/15 0928 08/12/15 0331 08/16/15 0926  AST 19 17  --   ALT 15* 14*  --   ALKPHOS 72 69  --   BILITOT 1.5* 1.4*  --   PROT 7.6 6.8  --   ALBUMIN 3.0* 2.4* 2.2*    Recent Labs Lab 08/11/15 0928  08/14/15 1815 08/16/15 0926 08/17/15 0230  WBC 20.2*  < > 13.0* 11.6* 12.0*  NEUTROABS 17.8*  --  8.7* 7.7  --   HGB 10.2*  < > 8.6* 7.7* 8.5*  HCT 32.6*  < > 26.1* 24.0* 26.0*  MCV 94.8  < > 92.2 95.6 95.2  PLT 71*  < > 161 270 333  < > = values in this interval not displayed.

## 2015-08-17 NOTE — Progress Notes (Signed)
  Transfer note  Arrival Method: Wheelchair from 2S Mental Orientation:A&OX4 Telemetry: N/A Assessment: See Flowsheet Skin: L upper arm incision, LBKA IV: R upper arm NSL Pain: 10/10 refuses medication Tubes:N/A  Safety Measures: bed in lowest positon, call light within reach, nonskid sock place  6700 Orientation: Patient has been oriented to the unit, staff and to the room. Orders have been implemented. Will continue to assess and monitor pt.   [name]

## 2015-08-18 ENCOUNTER — Encounter (HOSPITAL_COMMUNITY)
Admission: EM | Disposition: A | Discharge status: Home / Self Care | Payer: Self-pay | Source: Home / Self Care | Attending: Oncology

## 2015-08-18 ENCOUNTER — Inpatient Hospital Stay (HOSPITAL_COMMUNITY): Payer: Medicare Other

## 2015-08-18 ENCOUNTER — Inpatient Hospital Stay (HOSPITAL_COMMUNITY): Payer: Medicare Other | Admitting: Anesthesiology

## 2015-08-18 ENCOUNTER — Encounter (HOSPITAL_COMMUNITY): Payer: Self-pay | Admitting: Anesthesiology

## 2015-08-18 DIAGNOSIS — B9562 Methicillin resistant Staphylococcus aureus infection as the cause of diseases classified elsewhere: Secondary | ICD-10-CM

## 2015-08-18 DIAGNOSIS — I509 Heart failure, unspecified: Secondary | ICD-10-CM

## 2015-08-18 DIAGNOSIS — Z7984 Long term (current) use of oral hypoglycemic drugs: Secondary | ICD-10-CM

## 2015-08-18 DIAGNOSIS — I288 Other diseases of pulmonary vessels: Secondary | ICD-10-CM

## 2015-08-18 DIAGNOSIS — I330 Acute and subacute infective endocarditis: Secondary | ICD-10-CM

## 2015-08-18 DIAGNOSIS — R7881 Bacteremia: Secondary | ICD-10-CM

## 2015-08-18 DIAGNOSIS — N184 Chronic kidney disease, stage 4 (severe): Secondary | ICD-10-CM

## 2015-08-18 HISTORY — PX: INSERTION OF DIALYSIS CATHETER: SHX1324

## 2015-08-18 HISTORY — PX: TEE WITHOUT CARDIOVERSION: SHX5443

## 2015-08-18 LAB — CBC
HCT: 27.6 % — ABNORMAL LOW (ref 39.0–52.0)
HEMATOCRIT: 26.5 % — AB (ref 39.0–52.0)
Hemoglobin: 8.8 g/dL — ABNORMAL LOW (ref 13.0–17.0)
Hemoglobin: 8.6 g/dL — ABNORMAL LOW (ref 13.0–17.0)
MCH: 30 pg (ref 26.0–34.0)
MCH: 30.5 pg (ref 26.0–34.0)
MCHC: 31.9 g/dL (ref 30.0–36.0)
MCHC: 32.5 g/dL (ref 30.0–36.0)
MCV: 94.2 fL (ref 78.0–100.0)
MCV: 94 fL (ref 78.0–100.0)
PLATELETS: 453 10*3/uL — AB (ref 150–400)
Platelets: 486 10*3/uL — ABNORMAL HIGH (ref 150–400)
RBC: 2.93 MIL/uL — ABNORMAL LOW (ref 4.22–5.81)
RBC: 2.82 MIL/uL — ABNORMAL LOW (ref 4.22–5.81)
RDW: 17 % — AB (ref 11.5–15.5)
RDW: 16.7 % — AB (ref 11.5–15.5)
WBC: 10.7 10*3/uL — AB (ref 4.0–10.5)
WBC: 12.9 10*3/uL — ABNORMAL HIGH (ref 4.0–10.5)

## 2015-08-18 LAB — RENAL FUNCTION PANEL
ALBUMIN: 2.2 g/dL — AB (ref 3.5–5.0)
Anion gap: 18 — ABNORMAL HIGH (ref 5–15)
BUN: 48 mg/dL — AB (ref 6–20)
CHLORIDE: 94 mmol/L — AB (ref 101–111)
CO2: 22 mmol/L (ref 22–32)
Calcium: 9.3 mg/dL (ref 8.9–10.3)
Creatinine, Ser: 11.05 mg/dL — ABNORMAL HIGH (ref 0.61–1.24)
GFR calc Af Amer: 5 mL/min — ABNORMAL LOW (ref >60)
GFR calc non Af Amer: 5 mL/min — ABNORMAL LOW (ref >60)
GLUCOSE: 180 mg/dL — AB (ref 65–99)
PHOSPHORUS: 8.7 mg/dL — AB (ref 2.5–4.6)
POTASSIUM: 4.4 mmol/L (ref 3.5–5.1)
Sodium: 134 mmol/L — ABNORMAL LOW (ref 135–145)

## 2015-08-18 LAB — BASIC METABOLIC PANEL
Anion gap: 16 — ABNORMAL HIGH (ref 5–15)
BUN: 42 mg/dL — AB (ref 6–20)
CHLORIDE: 93 mmol/L — AB (ref 101–111)
CO2: 23 mmol/L (ref 22–32)
Calcium: 9.2 mg/dL (ref 8.9–10.3)
Creatinine, Ser: 10.1 mg/dL — ABNORMAL HIGH (ref 0.61–1.24)
GFR calc Af Amer: 6 mL/min — ABNORMAL LOW (ref >60)
GFR calc non Af Amer: 5 mL/min — ABNORMAL LOW (ref >60)
GLUCOSE: 224 mg/dL — AB (ref 65–99)
POTASSIUM: 4.2 mmol/L (ref 3.5–5.1)
Sodium: 132 mmol/L — ABNORMAL LOW (ref 135–145)

## 2015-08-18 LAB — GLUCOSE, CAPILLARY
GLUCOSE-CAPILLARY: 205 mg/dL — AB (ref 65–99)
GLUCOSE-CAPILLARY: 138 mg/dL — AB (ref 65–99)
Glucose-Capillary: 163 mg/dL — ABNORMAL HIGH (ref 65–99)
Glucose-Capillary: 158 mg/dL — ABNORMAL HIGH (ref 65–99)
Glucose-Capillary: 148 mg/dL — ABNORMAL HIGH (ref 65–99)
Glucose-Capillary: 161 mg/dL — ABNORMAL HIGH (ref 65–99)
Glucose-Capillary: 170 mg/dL — ABNORMAL HIGH (ref 65–99)
Glucose-Capillary: 158 mg/dL — ABNORMAL HIGH (ref 65–99)

## 2015-08-18 SURGERY — INSERTION OF DIALYSIS CATHETER
Anesthesia: Choice

## 2015-08-18 SURGERY — INSERTION OF DIALYSIS CATHETER
Anesthesia: Monitor Anesthesia Care | Site: Groin

## 2015-08-18 MED ORDER — FENTANYL CITRATE (PF) 100 MCG/2ML IJ SOLN
INTRAMUSCULAR | Status: DC | PRN
  Administered 2015-08-18 (×5): 50 ug via INTRAVENOUS

## 2015-08-18 MED ORDER — SODIUM CHLORIDE 0.9 % IV SOLN
INTRAVENOUS | Status: DC | PRN
  Administered 2015-08-18: 500 mL

## 2015-08-18 MED ORDER — MIDAZOLAM HCL 5 MG/5ML IJ SOLN
INTRAMUSCULAR | Status: DC | PRN
  Administered 2015-08-18: 2 mg via INTRAVENOUS

## 2015-08-18 MED ORDER — PROPOFOL 10 MG/ML IV BOLUS
INTRAVENOUS | Status: DC | PRN
  Administered 2015-08-18 (×5): 30 mg via INTRAVENOUS

## 2015-08-18 MED ORDER — WHITE PETROLATUM GEL
Status: AC
  Administered 2015-08-18: 17:00:00
  Filled 2015-08-18: qty 1

## 2015-08-18 MED ORDER — FENTANYL CITRATE (PF) 100 MCG/2ML IJ SOLN: 25.0000 ug | INTRAMUSCULAR | Status: DC | PRN

## 2015-08-18 MED ORDER — HEPARIN SODIUM (PORCINE) 1000 UNIT/ML IJ SOLN
INTRAMUSCULAR | Status: AC
  Filled 2015-08-18: qty 1

## 2015-08-18 MED ORDER — SODIUM CHLORIDE 0.9 % IV SOLN
INTRAVENOUS | Status: DC | PRN
  Administered 2015-08-18: 11:00:00 via INTRAVENOUS

## 2015-08-18 MED ORDER — LIDOCAINE-EPINEPHRINE (PF) 1 %-1:200000 IJ SOLN
INTRAMUSCULAR | Status: AC
  Filled 2015-08-18: qty 30

## 2015-08-18 MED ORDER — 0.9 % SODIUM CHLORIDE (POUR BTL) OPTIME
TOPICAL | Status: DC | PRN
  Administered 2015-08-18: 1000 mL

## 2015-08-18 MED ORDER — HEPARIN SODIUM (PORCINE) 1000 UNIT/ML IJ SOLN
INTRAMUSCULAR | Status: DC | PRN
  Administered 2015-08-18: 6 mL

## 2015-08-18 MED ORDER — SODIUM CHLORIDE 0.9 % IV SOLN
INTRAVENOUS | Status: DC
  Administered 2015-08-18: 11:00:00 via INTRAVENOUS

## 2015-08-18 MED ORDER — MIDAZOLAM HCL 2 MG/2ML IJ SOLN
INTRAMUSCULAR | Status: AC
  Filled 2015-08-18: qty 2

## 2015-08-18 MED ORDER — FENTANYL CITRATE (PF) 250 MCG/5ML IJ SOLN
INTRAMUSCULAR | Status: AC
  Filled 2015-08-18: qty 5

## 2015-08-18 MED ORDER — MEPERIDINE HCL 25 MG/ML IJ SOLN: 6.2500 mg | INTRAMUSCULAR | Status: DC | PRN

## 2015-08-18 MED ORDER — LACTATED RINGERS IV SOLN
INTRAVENOUS | Status: DC
Start: 2015-08-18 — End: 2015-08-18

## 2015-08-18 MED ORDER — LIDOCAINE-EPINEPHRINE (PF) 1 %-1:200000 IJ SOLN
INTRAMUSCULAR | Status: DC | PRN
  Administered 2015-08-18: 30 mL
  Administered 2015-08-18: 8 mL

## 2015-08-18 MED ORDER — PROMETHAZINE HCL 25 MG/ML IJ SOLN: 6.2500 mg | INTRAMUSCULAR | Status: DC | PRN

## 2015-08-18 SURGICAL SUPPLY — 48 items
BAG DECANTER FOR FLEXI CONT (MISCELLANEOUS) ×4 IMPLANT
BIOPATCH RED 1 DISK 7.0 (GAUZE/BANDAGES/DRESSINGS) ×3 IMPLANT
BIOPATCH RED 1IN DISK 7.0MM (GAUZE/BANDAGES/DRESSINGS) ×1
CATH PALINDROME RT-P 15FX19CM (CATHETERS) IMPLANT
CATH PALINDROME RT-P 15FX23CM (CATHETERS) IMPLANT
CATH PALINDROME RT-P 15FX28CM (CATHETERS) IMPLANT
CATH PALINDROME RT-P 15FX55CM (CATHETERS) ×2 IMPLANT
COVER PROBE W GEL 5X96 (DRAPES) IMPLANT
COVER SURGICAL LIGHT HANDLE (MISCELLANEOUS) ×6 IMPLANT
DECANTER SPIKE VIAL GLASS SM (MISCELLANEOUS) ×4 IMPLANT
DRAPE C-ARM 42X72 X-RAY (DRAPES) ×4 IMPLANT
DRAPE CHEST BREAST 15X10 FENES (DRAPES) ×4 IMPLANT
DRAPE INCISE IOBAN 66X45 STRL (DRAPES) ×2 IMPLANT
GAUZE SPONGE 2X2 8PLY STRL LF (GAUZE/BANDAGES/DRESSINGS) ×2 IMPLANT
GAUZE SPONGE 4X4 16PLY XRAY LF (GAUZE/BANDAGES/DRESSINGS) ×4 IMPLANT
GLOVE BIO SURGEON STRL SZ 6.5 (GLOVE) ×2 IMPLANT
GLOVE BIO SURGEONS STRL SZ 6.5 (GLOVE) ×2
GLOVE BIOGEL PI IND STRL 6.5 (GLOVE) IMPLANT
GLOVE BIOGEL PI INDICATOR 6.5 (GLOVE) ×6
GLOVE ECLIPSE 6.5 STRL STRAW (GLOVE) ×2 IMPLANT
GLOVE SS BIOGEL STRL SZ 7 (GLOVE) ×2 IMPLANT
GLOVE SUPERSENSE BIOGEL SZ 7 (GLOVE) ×2
GOWN STRL REUS W/ TWL LRG LVL3 (GOWN DISPOSABLE) ×4 IMPLANT
GOWN STRL REUS W/TWL LRG LVL3 (GOWN DISPOSABLE) ×8
KIT BASIN OR (CUSTOM PROCEDURE TRAY) ×4 IMPLANT
KIT ROOM TURNOVER OR (KITS) ×4 IMPLANT
LIQUID BAND (GAUZE/BANDAGES/DRESSINGS) ×2 IMPLANT
MARKER SKIN DUAL TIP RULER LAB (MISCELLANEOUS) ×2 IMPLANT
NDL 18GX1X1/2 (RX/OR ONLY) (NEEDLE) ×2 IMPLANT
NDL HYPO 25GX1X1/2 BEV (NEEDLE) ×2 IMPLANT
NEEDLE 18GX1X1/2 (RX/OR ONLY) (NEEDLE) ×4 IMPLANT
NEEDLE 22X1 1/2 (OR ONLY) (NEEDLE) ×4 IMPLANT
NEEDLE HYPO 25GX1X1/2 BEV (NEEDLE) ×4 IMPLANT
NS IRRIG 1000ML POUR BTL (IV SOLUTION) ×4 IMPLANT
PACK SURGICAL SETUP 50X90 (CUSTOM PROCEDURE TRAY) ×4 IMPLANT
PAD ARMBOARD 7.5X6 YLW CONV (MISCELLANEOUS) ×8 IMPLANT
SOAP 2 % CHG 4 OZ (WOUND CARE) ×4 IMPLANT
SPONGE GAUZE 2X2 STER 10/PKG (GAUZE/BANDAGES/DRESSINGS) ×2
SUT ETHILON 3 0 PS 1 (SUTURE) ×4 IMPLANT
SUT VICRYL 4-0 PS2 18IN ABS (SUTURE) ×4 IMPLANT
SYR 20CC LL (SYRINGE) ×4 IMPLANT
SYR 5ML LL (SYRINGE) ×8 IMPLANT
SYR CONTROL 10ML LL (SYRINGE) ×4 IMPLANT
SYRINGE 10CC LL (SYRINGE) ×4 IMPLANT
TAPE CLOTH SURG 4X10 WHT LF (GAUZE/BANDAGES/DRESSINGS) ×2 IMPLANT
TAPE STRIPS DRAPE STRL (GAUZE/BANDAGES/DRESSINGS) ×2 IMPLANT
TOWEL OR 17X24 6PK STRL BLUE (TOWEL DISPOSABLE) ×2 IMPLANT
WATER STERILE IRR 1000ML POUR (IV SOLUTION) ×4 IMPLANT

## 2015-08-18 NOTE — H&P (View-Only) (Signed)
    [facility] has been requested to perform a transesophageal echocardiogram on 08/18/2015 for MRSA bacteremia.  After careful review of history and examination, the risks and benefits of transesophageal echocardiogram have been explained including risks of esophageal damage, perforation (1:10,000 risk), bleeding, pharyngeal hematoma as well as other potential complications associated with conscious sedation including aspiration, arrhythmia, respiratory failure and death. Alternatives to treatment were discussed, questions were answered. Patient is willing to proceed.   Rosaria Ferries, PA-C 08/18/2015 10:06 AM

## 2015-08-18 NOTE — Anesthesia Procedure Notes (Signed)
Procedure Name: MAC Date/Time: 08/18/2015 11:18 AM Performed by: Jacquiline Doe A Pre-anesthesia Checklist: Patient identified, Timeout performed, Emergency Drugs available, Suction available and Patient being monitored Patient Re-evaluated:Patient Re-evaluated prior to inductionOxygen Delivery Method: Nasal cannula Intubation Type: IV induction Placement Confirmation: positive ETCO2 Dental Injury: Teeth and Oropharynx as per pre-operative assessment

## 2015-08-18 NOTE — Progress Notes (Addendum)
[name] for Infectious Disease    Date of Admission:  08/11/2015   Total days of antibiotics 8        Day 8 vanco   ID: [name] is a 50 y.o. male with ESRD on HD who presented 1/27 with fever, chills and thrombosed access 2 days prior to admission. Blood cultures + MRSA s/p HERO graft removal on 1/30.   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 sodium chloride   Intravenous Once  . aspirin EC  81 mg Oral Daily  . calcitRIOL  2.5 mcg Oral Q M,W,F-HD  . calcium carbonate  4 tablet Oral TID WC  . darbepoetin (ARANESP) injection - DIALYSIS  150 mcg Intravenous Q Wed-HD  . heparin  5,000 Units Subcutaneous 3 times per day  . insulin aspart  0-9 Units Subcutaneous TID WC  . insulin detemir  15 Units Subcutaneous QHS  . midodrine  10 mg Oral TID WC  . multivitamin  1 tablet Oral QHS  . mupirocin ointment  1 application Nasal BID  . sodium chloride flush  3 mL Intravenous Q12H  . vancomycin  1,000 mg Intravenous Q M,W,F-HD    Objective: Vital signs in last 24 hours: Temp:  [98 F (36.7 C)-98.6 F (37 C)] 98.5 F (36.9 C) (02/03 1420) Pulse Rate:  [73-81] 74 (02/03 1420) Resp:  [8-18] 16 (02/03 1420) BP: (90-161)/(47-96) 149/47 mmHg (02/03 1420) SpO2:  [93 %-100 %] 95 % (02/03 1420) Weight:  [242 lb (109.77 kg)-242 lb 1 oz (109.8 kg)] 242 lb (109.77 kg) (02/03 1055) Physical Exam  Constitutional: [name] is oriented to person, place, and time. [name] appears well-developed and well-nourished. No distress.  HENT:    Mouth/Throat: Oropharynx is clear and moist. No oropharyngeal exudate.  Neck/chest wall: bandaged site from catheter removal. Also has left IJ HD cath in place Cardiovascular: Normal rate, regular rhythm and normal heart sounds. Exam reveals no gallop and no friction rub.  No murmur heard.  Pulmonary/Chest: Effort normal and breath sounds normal. No respiratory distress. [name] has no wheezes.  Abdominal: Soft. Bowel sounds are normal. [name] exhibits no distension. There is no tenderness.  Lymphadenopathy:  [name] has no cervical adenopathy.  Ext: left upper arm packed wet to dry, with broad sutures from graft removal, no surrounding erythema Skin: Skin is warm and dry. No rash noted. No erythema. Numerous keloid scars on forearms Psychiatric: flat affect    Lab Results  Recent Labs  08/17/15 0230 08/18/15 0540  WBC 12.0* 10.7*  HGB 8.5* 8.8*  HCT 26.0* 27.6*  NA 132* 132*  K 4.1 4.2  CL 93* 93*  CO2 25 23  BUN 27* 42*  CREATININE 7.10* 10.10*    Microbiology: 1/27 blood cx MRSA 1/30 blood cx staph aureus sensi pending 1/30 periwound cx staph aureus sensi pending 2/1 blood cx NGTD Studies/Results: 1/28 CXR per my read possible RUL infiltrate  Assessment/Plan: Complicated MRSA bacteremia with atrial vegetation 2/2 infected vascular graft s/p HeRO graft  removal on 1/30 = currently on vancomycin.   - Plan to treat for 6 wk, using 2/1 as day 1 of 42 with vancomycin, administer after each HD session - end date is March 14th, 2017 - will have patient be seen in ID clinic in 4-6 wk - upper arm surgical wound = continue with wet to dry dressing changes bid. Defer to vascular for management  Will sign off   [facility] Fl[facility] for Infectious Diseases Cell: [phone] Pager: [phone]  08/18/2015, 4:00 PM

## 2015-08-18 NOTE — Progress Notes (Signed)
Internal Medicine Attending  Date: 08/18/2015  Patient name: [name] Medical record number: [id number] Date of birth: 09/15/1965 Age: 50 y.o. Gender: male  I saw and evaluated the patient. I reviewed the resident's note by Dr. Marijean Bravo and I agree with the resident's findings and plans as documented in his progress note.  TEE demonstrated a right atrial vegetation which extends his IV Vancomycin course to 6 weeks.  He is undergoing dialysis tonight with his new tunneled catheter.  We anticipate D/C home in the AM to complete course of IV vancomycin.  We will continue to follow the blood cultures from 08/16/2015.

## 2015-08-18 NOTE — Interval H&P Note (Signed)
History and Physical Interval Note:  08/18/2015 12:04 PM  [name]  has presented today for surgery, with the diagnosis of Renal failure  The various methods of treatment have been discussed with the patient and family. After consideration of risks, benefits and other options for treatment, the patient has consented to  Procedure(s): INSERTION OF DIALYSIS CATHETER RIGHT FEMORAL VEIN (N/A) TRANSESOPHAGEAL ECHOCARDIOGRAM (TEE) (N/A) as a surgical intervention .  The patient's history has been reviewed, patient examined, no change in status, stable for surgery.  I have reviewed the patient's chart and labs.  Questions were answered to the patient's satisfaction.     Cyndie Woodbeck

## 2015-08-18 NOTE — CV Procedure (Signed)
     Transesophageal echocardiogram  Timeout performed  Indications bacteremia, end-stage renal disease, exclude vegetation  Findings:   - There is a vegetation in the right atrium, attached to the right atrial free wall close to the insertion of the inferior vena cava. It measures 1.7 cm in length. Thickened, mobile. This structure does not appear to be anatomically a eustachian valve (usually thin in character and fairly nonmobile). This vegetation is not connected to newly inserted hemodialysis catheter from femoral vein.  -Ejection fraction 30-35% -Mild mitral regurgitation -Mild tricuspid regurgitation  I have left a message with infectious disease regarding finding.  Candee Furbish, MD

## 2015-08-18 NOTE — Op Note (Signed)
OPERATIVE REPORT  Date of Surgery: 08/11/2015 - 08/18/2015  Surgeon: [name], MD  Assistant: Nurse  Pre-op Diagnosis: Renal failure  Post-op Diagnosis: Renal failure  Procedure: Procedure(s): Attempted insertion of dialysis catheter bilateral IJ-unable to access either internal jugular vein INSERTION OF DIALYSIS CATHETER RIGHT FEMORAL VEIN-55 cm TRANSESOPHAGEAL ECHOCARDIOGRAM (TEE)  Anesthesia: MAC  EBL: Minimal  Complications: None  Procedure Details: The patient was taken to the operating room placed in the Trendelenburg position both internal jugular veins were imaged using B-mode ultrasound. There was no large internal jugular vein noted on either side. The patient was quite large. He had recently had an infected hero catheter removed from the left side. After multiple attempts to access the internal jugular veins bilaterally in the neck after infiltration forms and Xylocaine with epinephrine this was abandoned and attention turned to the right inguinal area which had been prepped and draped in routine sterile manner. The right femoral vein was entered after infiltration of 1% Xylocaine. The tract was dilated appropriately and a 55 cm palindrome catheter passer a peel-away sheath position and the right atrium. It was then tunneled peripherally secured with nylon sutures and the wound closed with Vicryl in a subcuticular fashion. Sterile dressing was applied Dr. Elta Guadeloupe skains then performed an EEG prior to patient returning to the recovery room   [name], MD 08/18/2015 12:00 PM

## 2015-08-18 NOTE — Transfer of Care (Signed)
Immediate Anesthesia Transfer of Care Note  Patient: [name]  Procedure(s) Performed: Procedure(s): INSERTION OF DIALYSIS CATHETER RIGHT FEMORAL VEIN (N/A) TRANSESOPHAGEAL ECHOCARDIOGRAM (TEE) (N/A)  Patient Location: PACU  Anesthesia Type:MAC  Level of Consciousness: awake, alert , oriented, sedated, patient cooperative and responds to stimulation  Airway & Oxygen Therapy: Patient Spontanous Breathing and Patient connected to nasal cannula oxygen  Post-op Assessment: Report given to RN, Post -op Vital signs reviewed and stable, Patient moving all extremities and Patient moving all extremities X 4  Post vital signs: Reviewed and stable  Last Vitals:  Filed Vitals:   08/18/15 0505 08/18/15 0822  BP: 113/96 98/85  Pulse: 81 80  Temp: 37 C 36.9 C  Resp: 18 18    Complications: No apparent anesthesia complications

## 2015-08-18 NOTE — Progress Notes (Signed)
Echocardiogram Echocardiogram Transesophageal has been performed.  [name] 08/18/2015, 12:48 PM

## 2015-08-18 NOTE — Progress Notes (Signed)
    [facility] has been requested to perform a transesophageal echocardiogram on 08/18/2015 for MRSA bacteremia.  After careful review of history and examination, the risks and benefits of transesophageal echocardiogram have been explained including risks of esophageal damage, perforation (1:10,000 risk), bleeding, pharyngeal hematoma as well as other potential complications associated with conscious sedation including aspiration, arrhythmia, respiratory failure and death. Alternatives to treatment were discussed, questions were answered. Patient is willing to proceed.   Rosaria Ferries, PA-C 08/18/2015 10:06 AM

## 2015-08-18 NOTE — Interval H&P Note (Signed)
History and Physical Interval Note:  08/18/2015 10:49 AM  [name]  has presented today for surgery, with the diagnosis of Renal failure  The various methods of treatment have been discussed with the patient and family. After consideration of risks, benefits and other options for treatment, the patient has consented to  Procedure(s): INSERTION OF DIALYSIS CATHETER (N/A) TRANSESOPHAGEAL ECHOCARDIOGRAM (TEE) (N/A) as a surgical intervention .  The patient's history has been reviewed, patient examined, no change in status, stable for surgery.  I have reviewed the patient's chart and labs.  Questions were answered to the patient's satisfaction.     [name]

## 2015-08-18 NOTE — Progress Notes (Signed)
Subjective:  Patient denies any chest pain, shortness of breath, fever, chills, or night sweats this morning. He had no questions for myself or the team.   Objective: Vital signs in last 24 hours: Filed Vitals:   08/18/15 1315 08/18/15 1330 08/18/15 1345 08/18/15 1420  BP: 136/61 90/79 136/73 149/47  Pulse: 73 73 75 74  Temp:   98 F (36.7 C) 98.5 F (36.9 C)  TempSrc:    Oral  Resp: 16 14 15 16   Height:      Weight:      SpO2: 98% 100% 99% 95%   Weight change: -2 lb 3.3 oz (-1 kg)  Intake/Output Summary (Last 24 hours) at 08/18/15 1636 Last data filed at 08/18/15 1230  Gross per 24 hour  Intake    522 ml  Output     10 ml  Net    512 ml   Physical Exam: General: Lying in bed, NAD HEENT: MMM. No scleral icterus Cardiovascular: RRR, no m/r/g Pulmonary: Clear to auscultation bilaterally. Unlabored breathing Abdominal: Soft, NT/ND Skin: Prior graft site c/d/i with dressing Extremities: Left BKA, warm and well perfused Neurological: Tongue midline, face symmetric Psychiatric: Normal behavior and affect   Lab Results: Basic Metabolic Panel:  Recent Labs Lab 08/12/15 0331  08/16/15 0926 08/17/15 0230 08/18/15 0540  NA 134*  < > 137 132* 132*  K 4.1  < > 4.2 4.1 4.2  CL 93*  < > 100* 93* 93*  CO2 25  < > 24 25 23   GLUCOSE 130*  < > 115* 262* 224*  BUN 58*  < > 40* 27* 42*  CREATININE 13.35*  < > 10.03* 7.10* 10.10*  CALCIUM 8.0*  < > 8.4* 8.7* 9.2  PHOS 7.2*  --  7.1*  --   --   < > = values in this interval not displayed. Liver Function Tests:  Recent Labs Lab 08/12/15 0331 08/16/15 0926  AST 17  --   ALT 14*  --   ALKPHOS 69  --   BILITOT 1.4*  --   PROT 6.8  --   ALBUMIN 2.4* 2.2*   CBC:  Recent Labs Lab 08/14/15 1815 08/16/15 0926 08/17/15 0230 08/18/15 0540  WBC 13.0* 11.6* 12.0* 10.7*  NEUTROABS 8.7* 7.7  --   --   HGB 8.6* 7.7* 8.5* 8.8*  HCT 26.1* 24.0* 26.0* 27.6*  MCV 92.2 95.6 95.2 94.2  PLT 161 270 333 453*    CBG:  Recent Labs Lab 08/17/15 1526 08/17/15 1659 08/17/15 2043 08/18/15 0757 08/18/15 1031 08/18/15 1301  GLUCAP 163* 160* 182* 205* 158* 148*    Micro Results: Recent Results (from the past 240 hour(s))  Culture, blood (routine x 2)     Status: None   Collection Time: 08/11/15  9:16 AM  Result Value Ref Range Status   Specimen Description BLOOD RIGHT ANTECUBITAL  Final   Special Requests BOTTLES DRAWN AEROBIC AND ANAEROBIC 5CCS  Final   Culture  Setup Time   Final    GRAM POSITIVE COCCI IN CLUSTERS IN BOTH AEROBIC AND ANAEROBIC BOTTLES CRITICAL RESULT CALLED TO, READ BACK BY AND VERIFIED WITH: T WOODS 08/13/15 @ [id number] M VESTAL    Culture   Final    STAPHYLOCOCCUS AUREUS SUSCEPTIBILITIES PERFORMED ON PREVIOUS CULTURE WITHIN THE LAST 5 DAYS.    Report Status 08/15/2015 FINAL  Final  Culture, blood (routine x 2)     Status: None   Collection Time: 08/11/15  9:20 AM  Result Value Ref Range Status   Specimen Description BLOOD RIGHT ARM  Final   Special Requests BOTTLES DRAWN AEROBIC AND ANAEROBIC 5CCS  Final   Culture  Setup Time   Final    GRAM POSITIVE COCCI IN CLUSTERS IN BOTH AEROBIC AND ANAEROBIC BOTTLES CRITICAL RESULT CALLED TO, READ BACK BY AND VERIFIED WITH: Alphonse Guild [id number] [place]    Culture METHICILLIN RESISTANT STAPHYLOCOCCUS AUREUS  Final   Report Status 08/15/2015 FINAL  Final   Organism ID, Bacteria METHICILLIN RESISTANT STAPHYLOCOCCUS AUREUS  Final      Susceptibility   Methicillin resistant staphylococcus aureus - MIC*    CIPROFLOXACIN >=8 RESISTANT Resistant     ERYTHROMYCIN >=8 RESISTANT Resistant     GENTAMICIN <=0.5 SENSITIVE Sensitive     OXACILLIN >=4 RESISTANT Resistant     TETRACYCLINE <=1 SENSITIVE Sensitive     VANCOMYCIN <=0.5 SENSITIVE Sensitive     TRIMETH/SULFA <=10 SENSITIVE Sensitive     CLINDAMYCIN <=0.25 SENSITIVE Sensitive     RIFAMPIN <=0.5 SENSITIVE Sensitive     Inducible Clindamycin NEGATIVE Sensitive     *  METHICILLIN RESISTANT STAPHYLOCOCCUS AUREUS  C difficile quick scan w PCR reflex     Status: None   Collection Time: 08/11/15  7:07 PM  Result Value Ref Range Status   C Diff antigen NEGATIVE NEGATIVE Final   C Diff toxin NEGATIVE NEGATIVE Final   C Diff interpretation Negative for toxigenic C. difficile  Final  MRSA PCR Screening     Status: Abnormal   Collection Time: 08/12/15  2:34 AM  Result Value Ref Range Status   MRSA by PCR POSITIVE (A) NEGATIVE Final    Comment:        The GeneXpert MRSA Assay (FDA approved for NASAL specimens only), is one component of a comprehensive MRSA colonization surveillance program. It is not intended to diagnose MRSA infection nor to guide or monitor treatment for MRSA infections. RESULT CALLED TO, READ BACK BY AND VERIFIED WITH: EDNA GARNETTE MELINGER,RN @0639  08/13/15 MKELLY   Surgical pcr screen     Status: Abnormal   Collection Time: 08/14/15  6:11 AM  Result Value Ref Range Status   MRSA, PCR POSITIVE (A) NEGATIVE Final    Comment: RESULT CALLED TO, READ BACK BY AND VERIFIED WITH: L. [facility] RN 11:00 08/14/15 ([email or web address])    Staphylococcus aureus POSITIVE (A) NEGATIVE Final    Comment:        The Xpert SA Assay (FDA approved for NASAL specimens in patients over 7 years of age), is one component of a comprehensive surveillance program.  Test performance has been validated by [facility] for patients greater than or equal to 75 year old. It is not intended to diagnose infection nor to guide or monitor treatment.   Anaerobic culture     Status: None (Preliminary result)   Collection Time: 08/14/15 12:19 PM  Result Value Ref Range Status   Specimen Description WOUND ARM LEFT  Final   Special Requests PERIGRAFT/PT ON VANC  Final   Gram Stain   Final    FEW WBC PRESENT,BOTH PMN AND MONONUCLEAR NO SQUAMOUS EPITHELIAL CELLS SEEN NO ORGANISMS SEEN Performed at [facility]    Culture   Final    NO ANAEROBES ISOLATED; CULTURE  IN PROGRESS FOR 5 DAYS Performed at [facility]    Report Status PENDING  Incomplete  Wound culture     Status: None   Collection Time: 08/14/15 12:19 PM  Result Value  Ref Range Status   Specimen Description WOUND ARM LEFT  Final   Special Requests PERIGRAFT/PT ON VANC  Final   Gram Stain   Final    FEW WBC PRESENT,BOTH PMN AND MONONUCLEAR NO SQUAMOUS EPITHELIAL CELLS SEEN NO ORGANISMS SEEN Performed at [facility]    Culture   Final    MODERATE METHICILLIN RESISTANT STAPHYLOCOCCUS AUREUS Note: RIFAMPIN AND GENTAMICIN SHOULD NOT BE USED AS SINGLE DRUGS FOR TREATMENT OF STAPH INFECTIONS. This organism DOES NOT demonstrate inducible Clindamycin resistance in vitro. RBC STEPHNIE F 2/2 @1000  BY REAMM Performed at [facility]    Report Status 08/17/2015 FINAL  Final   Organism ID, Bacteria METHICILLIN RESISTANT STAPHYLOCOCCUS AUREUS  Final      Susceptibility   Methicillin resistant staphylococcus aureus - MIC*    CLINDAMYCIN <=0.25 SENSITIVE Sensitive     ERYTHROMYCIN >=8 RESISTANT Resistant     GENTAMICIN <=0.5 SENSITIVE Sensitive     LEVOFLOXACIN 4 INTERMEDIATE Intermediate     OXACILLIN >=4 RESISTANT Resistant     RIFAMPIN <=0.5 SENSITIVE Sensitive     TRIMETH/SULFA <=10 SENSITIVE Sensitive     VANCOMYCIN 1 SENSITIVE Sensitive     TETRACYCLINE <=1 SENSITIVE Sensitive     * MODERATE METHICILLIN RESISTANT STAPHYLOCOCCUS AUREUS  Culture, blood (Routine X 2) w Reflex to ID Panel     Status: None   Collection Time: 08/14/15  3:45 PM  Result Value Ref Range Status   Specimen Description BLOOD RIGHT HAND  Final   Special Requests BOTTLES DRAWN AEROBIC ONLY 2CC  Final   Culture  Setup Time   Final    GRAM POSITIVE COCCI IN CLUSTERS PEDIATRIC BOTTLE CRITICAL RESULT CALLED TO, READ BACK BY AND VERIFIED WITH: C. FOWLER,RN AT [id number] ON [id number] BY Rhea Bleacher    Culture   Final    STAPHYLOCOCCUS AUREUS SUSCEPTIBILITIES PERFORMED ON PREVIOUS CULTURE WITHIN THE  LAST 5 DAYS.    Report Status 08/16/2015 FINAL  Final  Culture, blood (Routine X 2) w Reflex to ID Panel     Status: None   Collection Time: 08/14/15  4:00 PM  Result Value Ref Range Status   Specimen Description BLOOD RIGHT HAND  Final   Special Requests BOTTLES DRAWN AEROBIC ONLY 1CC  Final   Culture  Setup Time   Final    GRAM POSITIVE COCCI IN CLUSTERS PEDIATRIC BOTTLE CRITICAL RESULT CALLED TO, READ BACK BY AND VERIFIED WITH: C. FOWLER,RN AT [id number] ON [id number] BY Rhea Bleacher    Culture   Final    STAPHYLOCOCCUS AUREUS SUSCEPTIBILITIES PERFORMED ON PREVIOUS CULTURE WITHIN THE LAST 5 DAYS.    Report Status 08/16/2015 FINAL  Final  Culture, blood (routine x 2)     Status: None (Preliminary result)   Collection Time: 08/16/15 10:38 AM  Result Value Ref Range Status   Specimen Description BLOOD URINE, CATHETERIZED  Final   Special Requests BOTTLES DRAWN AEROBIC AND ANAEROBIC 10CC  Final   Culture NO GROWTH 2 DAYS  Final   Report Status PENDING  Incomplete  Culture, blood (routine x 2)     Status: None (Preliminary result)   Collection Time: 08/16/15 10:55 AM  Result Value Ref Range Status   Specimen Description BLOOD URINE, CATHETERIZED  Final   Special Requests BOTTLES DRAWN AEROBIC AND ANAEROBIC 10CC  Final   Culture NO GROWTH 2 DAYS  Final   Report Status PENDING  Incomplete   Studies/Results: Dg Chest Port 1 View  08/18/2015  CLINICAL DATA:  Dialysis catheter EXAM: PORTABLE CHEST 1 VIEW COMPARISON:  08/12/2015 FINDINGS: Mild cardiomegaly. Vascular congestion. Low volumes. Bibasilar atelectasis. Left jugular central venous catheter removed. Left jugular hero catheter removed. Dialysis catheter has been placed from the lower extremity and the tip is in the right atrium. IMPRESSION: Bibasilar atelectasis Vascular congestion Dialysis catheter from the lower extremity has been placed with the tip in the right atrium. Electronically Signed   By: Marybelle Killings M.D.   On: 08/18/2015 13:17    Dg Fluoro Guide Cv Line-no Report  08/18/2015  CLINICAL DATA:  FLOURO GUIDE CV LINE Fluoroscopy was utilized by the requesting physician.  No radiographic interpretation.   Medications: I have reviewed the patient's current medications. Scheduled Meds: . aspirin EC  81 mg Oral Daily  . calcitRIOL  2.5 mcg Oral Q M,W,F-HD  . calcium carbonate  4 tablet Oral TID WC  . darbepoetin (ARANESP) injection - DIALYSIS  150 mcg Intravenous Q Wed-HD  . heparin  5,000 Units Subcutaneous 3 times per day  . insulin aspart  0-9 Units Subcutaneous TID WC  . insulin detemir  15 Units Subcutaneous QHS  . midodrine  10 mg Oral TID WC  . multivitamin  1 tablet Oral QHS  . mupirocin ointment  1 application Nasal BID  . sodium chloride flush  3 mL Intravenous Q12H  . vancomycin  1,000 mg Intravenous Q M,W,F-HD  . 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 He remains afebrile. WBC 12 > 10.7. TEE today demonstrates right atrial vegetation suggestive of endocarditis. Dr. Baxter Flattery from [place] has evaluated the patient, and we greatly appreciate her recommendations for appropriate course of therapy.  - Repeat BCx from 2/1 negative to date - 6 weeks of IV vancomycin therapy dosed with HD from date of last negative blood culture  CHF (EF 30-35%): Newly detected during this admission. Unclear if this represents a new onset or longstanding. No dyspnea. - Close outpatient follow-up, already had an appointment for 2/21 with Cardiology - Consider adding beta-blocker in consort with nephrology  ESRD on HD: Right femoral catheter placed by [facility]. On MWF schedule  T2DM: CBGs largely in 100s over past 24 hours.  - continue Lantus 15 units qHS and SSI-S  DVT Prophylaxis: Heparin [place]  Dispo:  Anticipated discharge tomorrow.   The patient does have a current PCP Delrae Rend, MD) and does not need an [facility] hospital follow-up appointment after discharge.  The patient does have transportation limitations that hinder transportation to clinic appointments.  .Services Needed at time of discharge: Y = Yes, Blank = No PT:   OT:   RN:   Equipment:   Other:     LOS: 7 days   Liberty Handy, MD 08/18/2015, 4:36 PM

## 2015-08-18 NOTE — Progress Notes (Signed)
[name] Progress Note  Assessment/Plan: 1. MRSA bacteremia - in ESRD patient w HeRO access - now s/p removal. The temporary left IJ HD cath placed 1/27 also now removed. TEE per cardiology does show a vegetation in his atrium. Is scheduled for placement of new tunneled HD catheter later today. TMAX 98.6 past 24 hours. Continue vanc  2 ESRD MWF HD at [place]. Will have HD later today when new access is placed.  3 Volume - seems euvolemic- was started on midodrine when his dry wt was lowered after BKA. Na+ continues to be low, 132.  With low sodium raises issue of volume- has big gains as OP- will challenge with HD today as tolerated.  5 Anemia Hb 8.8 today.  esa given 2/1 - will follow- transfuse as needed  6 MBD cont meds- TUMS/calcitriol- phos notoriously not well controlled as OP  7 DM2 8 PAD L BKA  Rita H. Brown NP-C 08/18/2015, 10:25 AM  [facility] [phone]  Patient seen and examined, agree with above note with above modifications. Seen after his new femoral PC placement- he hopes that he can get HD and go home but now I see that he has a vegetation in his atrium which is likely to complicate things- cont vanc for now  Corliss Parish, MD 08/18/2015     Subjective:  "I feel OK, just wish I could go home". Up in chair, NAD. Denies Pain/SOB.    Objective Filed Vitals:   08/17/15 2058 08/18/15 0500 08/18/15 0505 08/18/15 0822  BP: 134/71  113/96 98/85  Pulse: 79  81 80  Temp: 98.2 F (36.8 C)  98.6 F (37 C) 98.4 F (36.9 C)  TempSrc: Oral  Oral Oral  Resp: 16  18 18   Height:      Weight:  109.8 kg (242 lb 1 oz)    SpO2: 99%  94% 93%   Physical Exam General: Well nourished, NAD Heart: HS distant, S1, S2, No M/G/R Lungs: Bilateral breath sounds sl. Decreased in bases. Without WOB Abdomen: abdomen soft active BS Extremities: R Charcot foot / L BKA Trace pre-tib edema Dialysis Access: None right now. LUE bandaged S/P removal of  HERO graft.   Dialysis Orders: MWF [facility] 4.5h 110.5kg 2/2 bath Heparin 4500/ 1800 midRx LUE HeRO- now out Calc 0.5 ug tiw Mircera 50 q2, next 2/1  Venofer 50/wk Hb 11.2 Ca/P - 8/10.5 pth 529  Additional Objective Labs: Basic Metabolic Panel:  Recent Labs Lab 08/12/15 0331  08/16/15 0926 08/17/15 0230 08/18/15 0540  NA 134*  < > 137 132* 132*  K 4.1  < > 4.2 4.1 4.2  CL 93*  < > 100* 93* 93*  CO2 25  < > 24 25 23   GLUCOSE 130*  < > 115* 262* 224*  BUN 58*  < > 40* 27* 42*  CREATININE 13.35*  < > 10.03* 7.10* 10.10*  CALCIUM 8.0*  < > 8.4* 8.7* 9.2  PHOS 7.2*  --  7.1*  --   --   < > = values in this interval not displayed. Liver Function Tests:  Recent Labs Lab 08/12/15 0331 08/16/15 0926  AST 17  --   ALT 14*  --   ALKPHOS 69  --   BILITOT 1.4*  --   PROT 6.8  --   ALBUMIN 2.4* 2.2*   No results for input(s): LIPASE, AMYLASE in the last 168 hours. CBC:  Recent Labs Lab 08/14/15 0437 08/14/15 1815 08/16/15 0926 08/17/15  0230 08/18/15 0540  WBC 11.5* 13.0* 11.6* 12.0* 10.7*  NEUTROABS  --  8.7* 7.7  --   --   HGB 9.0* 8.6* 7.7* 8.5* 8.8*  HCT 28.0* 26.1* 24.0* 26.0* 27.6*  MCV 92.4 92.2 95.6 95.2 94.2  PLT 126* 161 270 333 453*   Blood Culture    Component Value Date/Time   SDES BLOOD URINE, CATHETERIZED 08/16/2015 1055   SPECREQUEST BOTTLES DRAWN AEROBIC AND ANAEROBIC 10CC 08/16/2015 1055   CULT NO GROWTH 1 DAY 08/16/2015 1055   REPTSTATUS PENDING 08/16/2015 1055    Cardiac Enzymes:  Recent Labs Lab 08/14/15 1815 08/15/15 0041 08/15/15 0600  CKTOTAL 66  --   --   TROPONINI <0.03 0.04* <0.03   CBG:  Recent Labs Lab 08/17/15 1210 08/17/15 1526 08/17/15 1659 08/17/15 2043 08/18/15 0757  GLUCAP 175* 163* 160* 182* 205*   Iron Studies: No results for input(s): IRON, TIBC, TRANSFERRIN, FERRITIN in the last 72 hours. @lablastinr3 @ Studies/Results: No results found. Medications: . sodium chloride Stopped (08/16/15 0700)   .  [MAR Hold] sodium chloride   Intravenous Once  . [MAR Hold] aspirin EC  81 mg Oral Daily  . [MAR Hold] calcitRIOL  2.5 mcg Oral Q M,W,F-HD  . [MAR Hold] calcium carbonate  4 tablet Oral TID WC  . cefUROXime (ZINACEF)  IV  1.5 g Intravenous On Call to OR  . [MAR Hold] darbepoetin (ARANESP) injection - DIALYSIS  150 mcg Intravenous Q Wed-HD  . [MAR Hold] heparin  5,000 Units Subcutaneous 3 times per day  . [MAR Hold] insulin aspart  0-9 Units Subcutaneous TID WC  . [MAR Hold] insulin detemir  15 Units Subcutaneous QHS  . [MAR Hold] midodrine  10 mg Oral TID WC  . [MAR Hold] multivitamin  1 tablet Oral QHS  . [MAR Hold] mupirocin ointment  1 application Nasal BID  . [MAR Hold] sodium chloride flush  3 mL Intravenous Q12H  . [MAR Hold] vancomycin  1,000 mg Intravenous Q M,W,F-HD

## 2015-08-18 NOTE — H&P (View-Only) (Signed)
Patient ID: [name], male   DOB: 12-Apr-1966, 50 y.o.   MRN: [id number] Comfortable this morning. Had temporary catheter removed yesterday. No fevers.  LAN tunneled catheter tomorrow in the operating room. Discussed with Mr. Markovic. Continue local wound care and advancement of Penrose drain

## 2015-08-18 NOTE — Progress Notes (Signed)
Patient back from hemo- alert and oriented. Bed in the lowest position. Given call light to call for need of anything

## 2015-08-18 NOTE — Progress Notes (Signed)
[facility]  Subjective  - doing well.   Objective 136/73 75 98 F (36.7 C) (Oral) 15 99%  Intake/Output Summary (Last 24 hours) at 08/18/15 1402 Last data filed at 08/18/15 1230  Gross per 24 hour  Intake    522 ml  Output     10 ml  Net    512 ml    Left arm dressing changed in the PACU Penrose drains still intact pulled back a cm.  Assessment/Planning: POD # 4 days s/p removal of Left arm infected av graft Dr. Kellie Simmering inserted right femoral catheter and cardiology performed TEE findings followed: - There is a vegetation in the right atrium, attached to the right atrial free wall close to the insertion of the inferior vena cava. It measures 1.7 cm in length. Thickened, mobile. This structure does not appear to be anatomically a eustachian valve (usually thin in character and fairly nonmobile). This vegetation is not connected to newly inserted hemodialysis catheter from femoral vein.  Laurence Slate [facility] 08/18/2015 2:02 PM --  Laboratory Lab Results:  Recent Labs  08/17/15 0230 08/18/15 0540  WBC 12.0* 10.7*  HGB 8.5* 8.8*  HCT 26.0* 27.6*  PLT 333 453*   BMET  Recent Labs  08/17/15 0230 08/18/15 0540  NA 132* 132*  K 4.1 4.2  CL 93* 93*  CO2 25 23  GLUCOSE 262* 224*  BUN 27* 42*  CREATININE 7.10* 10.10*  CALCIUM 8.7* 9.2    COAG Lab Results  Component Value Date   INR 1.16 04/14/2015   INR 1.15 03/07/2015   INR 1.15 01/20/2015   No results found for: PTT

## 2015-08-18 NOTE — Anesthesia Preprocedure Evaluation (Addendum)
Anesthesia Evaluation  Patient identified by MRN, date of birth, ID band Patient awake    Reviewed: Allergy & Precautions, NPO status , Patient's Chart, lab work & pertinent test results  Airway Mallampati: II  TM Distance: >3 FB Neck ROM: Full    Dental  (+) Dental Advisory Given,    Pulmonary former smoker,    breath sounds clear to auscultation       Cardiovascular hypertension, + Peripheral Vascular Disease  + dysrhythmias  Rhythm:Regular Rate:Normal     Neuro/Psych negative neurological ROS  negative psych ROS   GI/Hepatic hiatal hernia, GERD  Medicated,  Endo/Other  diabetes, Type 2, Oral Hypoglycemic Agents  Renal/GU CRF and DialysisRenal disease  negative genitourinary   Musculoskeletal negative musculoskeletal ROS (+)   Abdominal   Peds negative pediatric ROS (+)  Hematology   Anesthesia Other Findings - HypertPTH  Reproductive/Obstetrics negative OB ROS                            Lab Results  Component Value Date   WBC 10.7* 08/18/2015   HGB 8.8* 08/18/2015   HCT 27.6* 08/18/2015   MCV 94.2 08/18/2015   PLT 453* 08/18/2015   Lab Results  Component Value Date   CREATININE 10.10* 08/18/2015   BUN 42* 08/18/2015   NA 132* 08/18/2015   K 4.2 08/18/2015   CL 93* 08/18/2015   CO2 23 08/18/2015   Lab Results  Component Value Date   INR 1.16 04/14/2015   INR 1.15 03/07/2015   INR 1.15 01/20/2015   07/2015 EKG: sinus tachycardia.  08/2015 Echo - Left ventricle: The cavity size was mildly dilated. Wall thickness was increased in a pattern of mild LVH. Systolic function was moderately to severely reduced. The estimated ejection fraction was in the range of 30% to 35%. Diffuse hypokinesis. Doppler parameters are consistent with abnormal left ventricular relaxation (grade 1 diastolic dysfunction). - Mitral valve: Calcified annulus. Mildly thickened leaflets . - Left  atrium: The atrium was mildly dilated.     Anesthesia Physical Anesthesia Plan  ASA: III  Anesthesia Plan: MAC   Post-op Pain Management:    Induction: Intravenous  Airway Management Planned: Natural Airway  Additional Equipment:   Intra-op Plan:   Post-operative Plan:   Informed Consent: I have reviewed the patients History and Physical, chart, labs and discussed the procedure including the risks, benefits and alternatives for the proposed anesthesia with the patient or authorized representative who has indicated his/her understanding and acceptance.     Plan Discussed with: CRNA  Anesthesia Plan Comments:         Anesthesia Quick Evaluation

## 2015-08-19 DIAGNOSIS — Z89512 Acquired absence of left leg below knee: Secondary | ICD-10-CM

## 2015-08-19 DIAGNOSIS — E1151 Type 2 diabetes mellitus with diabetic peripheral angiopathy without gangrene: Secondary | ICD-10-CM

## 2015-08-19 LAB — RENAL FUNCTION PANEL
Albumin: 2.1 g/dL — ABNORMAL LOW (ref 3.5–5.0)
Anion gap: 19 — ABNORMAL HIGH (ref 5–15)
BUN: 48 mg/dL — ABNORMAL HIGH (ref 6–20)
CHLORIDE: 93 mmol/L — AB (ref 101–111)
CO2: 21 mmol/L — AB (ref 22–32)
CREATININE: 11.36 mg/dL — AB (ref 0.61–1.24)
Calcium: 9.5 mg/dL (ref 8.9–10.3)
GFR calc non Af Amer: 5 mL/min — ABNORMAL LOW (ref >60)
GFR, EST AFRICAN AMERICAN: 5 mL/min — AB (ref >60)
Glucose, Bld: 128 mg/dL — ABNORMAL HIGH (ref 65–99)
Phosphorus: 9.8 mg/dL — ABNORMAL HIGH (ref 2.5–4.6)
Potassium: 4.3 mmol/L (ref 3.5–5.1)
Sodium: 133 mmol/L — ABNORMAL LOW (ref 135–145)

## 2015-08-19 LAB — CBC
HCT: 26.2 % — ABNORMAL LOW (ref 39.0–52.0)
Hemoglobin: 8.6 g/dL — ABNORMAL LOW (ref 13.0–17.0)
MCH: 30.9 pg (ref 26.0–34.0)
MCHC: 32.8 g/dL (ref 30.0–36.0)
MCV: 94.2 fL (ref 78.0–100.0)
PLATELETS: 449 10*3/uL — AB (ref 150–400)
RBC: 2.78 MIL/uL — AB (ref 4.22–5.81)
RDW: 16.7 % — ABNORMAL HIGH (ref 11.5–15.5)
WBC: 11.3 10*3/uL — ABNORMAL HIGH (ref 4.0–10.5)

## 2015-08-19 LAB — ANAEROBIC CULTURE

## 2015-08-19 LAB — GLUCOSE, CAPILLARY: Glucose-Capillary: 152 mg/dL — ABNORMAL HIGH (ref 65–99)

## 2015-08-19 MED ORDER — ASPIRIN 81 MG PO TBEC: 81.0000 mg | DELAYED_RELEASE_TABLET | Freq: Every day | ORAL | Status: AC

## 2015-08-19 MED ORDER — OXYCODONE HCL 5 MG PO TABS: 5.0000 mg | ORAL_TABLET | ORAL | Status: DC | PRN

## 2015-08-19 MED ORDER — VANCOMYCIN HCL IN DEXTROSE 1-5 GM/200ML-% IV SOLN: 1000.0000 mg | INTRAVENOUS | Status: DC

## 2015-08-19 MED ORDER — VANCOMYCIN HCL IN DEXTROSE 1-5 GM/200ML-% IV SOLN
1000.0000 mg | INTRAVENOUS | Status: AC
  Administered 2015-08-19: 1000 mg via INTRAVENOUS

## 2015-08-19 NOTE — Consult Note (Signed)
[facility]  Subjective  - no complaints   Objective 100/58 94 98 F (36.7 C) (Oral) 18 95%  Intake/Output Summary (Last 24 hours) at 08/19/15 1156 Last data filed at 08/19/15 1125  Gross per 24 hour  Intake    300 ml  Output   4381 ml  Net  -4081 ml   Left upper extremity open wound clean 1 penrose out today Left hand warm Diatek cath functioning   Assessment/Planning: Continue local wound care left arm.  Drains out over the next couple days Leukocytosis trending down  Ruta Hinds 08/19/2015 11:56 AM --  Laboratory Lab Results:  Recent Labs  08/18/15 2020 08/19/15 0806  WBC 12.9* 11.3*  HGB 8.6* 8.6*  HCT 26.5* 26.2*  PLT 486* 449*   BMET  Recent Labs  08/18/15 2020 08/19/15 0807  NA 134* 133*  K 4.4 4.3  CL 94* 93*  CO2 22 21*  GLUCOSE 180* 128*  BUN 48* 48*  CREATININE 11.05* 11.36*  CALCIUM 9.3 9.5    COAG Lab Results  Component Value Date   INR 1.16 04/14/2015   INR 1.15 03/07/2015   INR 1.15 01/20/2015   No results found for: PTT

## 2015-08-19 NOTE — Progress Notes (Signed)
[facility] Progress Note  Assessment/Plan: 1. MRSA bacteremia - in ESRD patient w HeRO access - now s/p removal and re placement of new tunneled HD catheter 2/4. Will continue vanc at OP center for 6 weeks from date of negative blood culture- will be able to go home after HD 2 ESRD MWF HD at [facility]. Doing today to make sure  3 Volume - seems euvolemic- was started on midodrine when his dry wt was lowered after BKA. Na+ continues to be low, 132.  With low sodium raises issue of volume- has big gains as OP- will challenge with HD today as tolerated.  5 Anemia Hb 8.8 today.  esa given 2/1 - will follow- transfuse as needed  6 MBD cont meds- TUMS/calcitriol- phos notoriously not well controlled as OP  7 DM2 8 PAD L BKA  Corliss Parish, MD 08/19/2015     Subjective:  "I feel OK, just wish I could go home".  Seen on HD- some issues with PC retrying this AM- can use heparin- 300 BFR which should be adequate for OP HD  Objective Filed Vitals:   08/19/15 0725 08/19/15 0730 08/19/15 0800 08/19/15 0830  BP: 112/28 128/66 116/66 129/67  Pulse: 76 82 83 79  Temp:      TempSrc:      Resp: 16 15 15 15   Height:      Weight:      SpO2:       Physical Exam General: Well nourished, NAD Heart: HS distant, S1, S2, No M/G/R Lungs: Bilateral breath sounds sl. Decreased in bases. Without WOB Abdomen: abdomen soft active BS Extremities: R Charcot foot / L BKA Trace pre-tib edema Dialysis Access: None right now. LUE bandaged S/P removal of HERO graft.   Dialysis Orders: MWF [facility] 4.5h 110.5kg 2/2 bath Heparin 4500/ 1800 midRx LUE HeRO- now out- new femoral cath  Calc 0.5 ug tiw Mircera 50 q2, next 2/1  Venofer 50/wk Hb 11.2 Ca/P - 8/10.5 pth 529  Additional Objective Labs: Basic Metabolic Panel:  Recent Labs Lab 08/16/15 0926  08/18/15 0540 08/18/15 2020 08/19/15 0807  NA 137  < > 132* 134* 133*  K 4.2  < > 4.2 4.4 4.3  CL 100*  < > 93* 94* 93*  CO2 24  < >  23 22 21*  GLUCOSE 115*  < > 224* 180* 128*  BUN 40*  < > 42* 48* 48*  CREATININE 10.03*  < > 10.10* 11.05* 11.36*  CALCIUM 8.4*  < > 9.2 9.3 9.5  PHOS 7.1*  --   --  8.7* 9.8*  < > = values in this interval not displayed. Liver Function Tests:  Recent Labs Lab 08/16/15 0926 08/18/15 2020 08/19/15 0807  ALBUMIN 2.2* 2.2* 2.1*   No results for input(s): LIPASE, AMYLASE in the last 168 hours. CBC:  Recent Labs Lab 08/14/15 1815 08/16/15 0926 08/17/15 0230 08/18/15 0540 08/18/15 2020 08/19/15 0806  WBC 13.0* 11.6* 12.0* 10.7* 12.9* 11.3*  NEUTROABS 8.7* 7.7  --   --   --   --   HGB 8.6* 7.7* 8.5* 8.8* 8.6* 8.6*  HCT 26.1* 24.0* 26.0* 27.6* 26.5* 26.2*  MCV 92.2 95.6 95.2 94.2 94.0 94.2  PLT 161 270 333 453* 486* 449*   Blood Culture    Component Value Date/Time   SDES BLOOD URINE, CATHETERIZED 08/16/2015 1055   SPECREQUEST BOTTLES DRAWN AEROBIC AND ANAEROBIC 10CC 08/16/2015 1055   CULT NO GROWTH 2 DAYS 08/16/2015 1055  REPTSTATUS PENDING 08/16/2015 1055    Cardiac Enzymes:  Recent Labs Lab 08/14/15 1815 08/15/15 0041 08/15/15 0600  CKTOTAL 66  --   --   TROPONINI <0.03 0.04* <0.03   CBG:  Recent Labs Lab 08/18/15 1301 08/18/15 1414 08/18/15 1723 08/18/15 2216 08/18/15 2305  GLUCAP 148* 138* 161* 170* 158*   Iron Studies: No results for input(s): IRON, TIBC, TRANSFERRIN, FERRITIN in the last 72 hours. @lablastinr3 @ Studies/Results: Dg Chest Port 1 View  08/18/2015  CLINICAL DATA:  Dialysis catheter EXAM: PORTABLE CHEST 1 VIEW COMPARISON:  08/12/2015 FINDINGS: Mild cardiomegaly. Vascular congestion. Low volumes. Bibasilar atelectasis. Left jugular central venous catheter removed. Left jugular hero catheter removed. Dialysis catheter has been placed from the lower extremity and the tip is in the right atrium. IMPRESSION: Bibasilar atelectasis Vascular congestion Dialysis catheter from the lower extremity has been placed with the tip in the right atrium.  Electronically Signed   By: Marybelle Killings M.D.   On: 08/18/2015 13:17   Dg Fluoro Guide Cv Line-no Report  08/18/2015  CLINICAL DATA:  FLOURO GUIDE CV LINE Fluoroscopy was utilized by the requesting physician.  No radiographic interpretation.   Medications:   . aspirin EC  81 mg Oral Daily  . calcitRIOL  2.5 mcg Oral Q M,W,F-HD  . calcium carbonate  4 tablet Oral TID WC  . darbepoetin (ARANESP) injection - DIALYSIS  150 mcg Intravenous Q Wed-HD  . heparin  5,000 Units Subcutaneous 3 times per day  . insulin aspart  0-9 Units Subcutaneous TID WC  . insulin detemir  15 Units Subcutaneous QHS  . midodrine  10 mg Oral TID WC  . multivitamin  1 tablet Oral QHS  . mupirocin ointment  1 application Nasal BID  . sodium chloride flush  3 mL Intravenous Q12H  . vancomycin  1,000 mg Intravenous Q M,W,F-HD

## 2015-08-19 NOTE — Discharge Summary (Signed)
Name: [name] MRN: [id number] DOB: 1966/01/05 50 y.o. PCP: Delrae Rend, MD  Date of Admission: 08/11/2015  8:55 AM Date of Discharge: 08/19/2015 Attending Physician: Oval Linsey, MD  Discharge Diagnosis: Principal Problems 1. MRSA Bacteremia 2. MRSA Endocarditis 3. Septic Shock 4. Infection of arteriovenous graft Chronic Problems 5. CHF (EF 30-35%) 6. T2DM 7. ESRD on Dialysis 8. Left BKA 9. GERD 10. PVD  Discharge Medications:   Medication List    STOP taking these medications        pantoprazole 40 MG tablet  Commonly known as:  PROTONIX      TAKE these medications        aspirin 81 MG EC tablet  Take 1 tablet (81 mg total) by mouth daily.     b complex-vitamin c-folic acid 0.8 MG Tabs tablet  Take 1 tablet by mouth at bedtime.     calcitRIOL 0.5 MCG capsule  Commonly known as:  ROCALTROL  Take 5 capsules (2.5 mcg total) by mouth every Monday, Wednesday, and Friday with hemodialysis.     calcium carbonate 500 MG chewable tablet  Commonly known as:  TUMS - dosed in mg elemental calcium  Chew 4 tablets (800 mg of elemental calcium total) by mouth 3 (three) times daily with meals.     famotidine 20 MG tablet  Commonly known as:  PEPCID  Take 1 tablet (20 mg total) by mouth at bedtime.     insulin detemir 100 UNIT/ML injection  Commonly known as:  LEVEMIR  Inject 0.2 mLs (20 Units total) into the skin at bedtime.     methocarbamol 500 MG tablet  Commonly known as:  ROBAXIN  Take 1 tablet (500 mg total) by mouth every 6 (six) hours as needed for muscle spasms.     oxyCODONE 5 MG immediate release tablet  Commonly known as:  Oxy IR/ROXICODONE  Take 1-2 tablets (5-10 mg total) by mouth every 4 (four) hours as needed for breakthrough pain.     vancomycin 1 GM/200ML Soln  Commonly known as:  VANCOCIN  Inject 200 mLs (1,000 mg total) into the vein every Monday, Wednesday, and Friday with hemodialysis. Stop Date: 09/27/15 as last dose         Disposition and follow-up:   [name] was discharged from [facility] in stable condition.  At the hospital follow up visit please address:  1.  Patient was admitted for MRSA bacteremia due to an infection of an AV graft. A TEE was peformed that showed a right atrial vegetation, consistent with endocarditis. He will receive IV vancomycin with MWF dialysis for a 6 week course, last dose on 3/15 (2/1-3/15).  2. Patient had newly detected systolic heart failure (EF 30-35%). This will require an ischemic workup as an outpatient. He already has an appointment with cardiology on 2/21. Toward discharge, he denied any dyspnea, chest pain, or leg swelling. May require low dose beta blocker as outpatient.   3.  Labs / imaging needed at time of follow-up: None  4.  Pending labs/ test needing follow-up: 2/1 blood culture (NGTD)  Follow-up Appointments: Follow-up Information    Follow up with Lyda Jester, PA-C On 09/05/2015.   Specialties:  Cardiology, Radiology   Why:  10:30 am, FOR YOUR HEART FAILURE   Contact information:   [address] [place] [address] [phone]       Follow up with Carlyle Basques, MD.   Specialty:  Infectious Diseases  Why:  CALL TO MAKE APPOINTMENT FOR YOUR INFECTION (4 WEEK FOLLOW UP)   [address] [place] [address] [phone]       Schedule an appointment as soon as possible for a visit with KERR,JEFFREY, MD.   Specialty:  Endocrinology   Why:  [place] information:   [address] [place] [address] [phone]       Discharge Instructions: Discharge Instructions    Diet - low sodium heart healthy    Complete by:  As directed      Increase activity slowly    Complete by:  As directed           PLEASE BE SURE TO GO TO YOUR DIALYSIS IN ORDER TO RECEIVE YOUR ANTIBIOTICS.  DURING THIS ADMISSION, YOU HAD A  BACTERIA IN YOUR BLOOD THAT CAUSED AN INFECTION WITHIN YOUR HEART. YOU WILL RECEIVE ANTIBIOTICS FOR THIS WITH HEMODIALYSIS.   IT WILL BE VERY IMPORTANT FOR YOU TO GO TO YOUR CARDIOLOGY APPOINTMENT LISTED IN THIS PACKET FOR FOLLOW UP FOR YOUR HEART FAILURE.  YOU WILL ALSO NEED TO MAKE AN APPOINTMENT WITH THE INFECTIOUS DISEASE DOCTOR (DR. Baxter Flattery) TO FOLLOW UP FOR YOUR INFECTION. THIS NUMBER IS LISTED IN THIS PACKED FOR YOU.   Consultations: Treatment Team:  Roney Jaffe, MD Rosetta Posner, MD  Procedures Performed:  Dg Chest 2 View  08/12/2015  CLINICAL DATA:  Fever EXAM: CHEST  2 VIEW COMPARISON:  08/11/2015 FINDINGS: There is been interval placement of a dialysis catheter via the left jugular approach. A hero graft is again identified. Cardiac shadow is accentuated by the portable technique. Lungs are well aerated bilaterally. Slight increased density is noted in the right upper lobe which may represent some early infiltrate. No bony abnormality is seen. IMPRESSION: Changes consistent with end-stage renal disease and dialysis. Early right upper lobe infiltrate. Followup examination is recommended. Electronically Signed   By: Inez Catalina M.D.   On: 08/12/2015 07:42   Dg Chest 2 View  08/11/2015  CLINICAL DATA:  Fever, shortness of breath for 3 days 10/21/2012 EXAM: CHEST  2 VIEW COMPARISON:  None. FINDINGS: Cardiomediastinal silhouette is stable. No acute infiltrate or pleural effusion. No pulmonary edema. There is a left subclavian central catheter with tip in right atrium. Bony thorax is unremarkable. IMPRESSION: No active cardiopulmonary disease. Left subclavian central catheter with tip in right atrium. Electronically Signed   By: Lahoma Crocker M.D.   On: 08/11/2015 09:46   Ir Fluoro Guide Cv Line Left  08/12/2015  INDICATION: End-stage renal disease, occluded left upper extremity hero access, no current access, fever, possible infected access site. EXAM: IR LEFT FLOURO GUIDE CV LINE; IR  ULTRASOUND GUIDANCE VASC ACCESS LEFT MEDICATIONS: Already given in the emergency room. ANESTHESIA/SEDATION: None. FLUOROSCOPY TIME:  Fluoroscopy Time: 4 minutes 36 seconds (46 mGy). COMPLICATIONS: None immediate. PROCEDURE: Informed written consent was obtained from the patient after a thorough discussion of the procedural risks, benefits and alternatives. All questions were addressed. Maximal Sterile Barrier Technique was utilized including caps, mask, sterile gowns, sterile gloves, sterile drape, hand hygiene and skin antiseptic. A timeout was performed prior to the initiation of the procedure. Initially, under sterile conditions and local anesthesia, a right external jugular vein was accessed with a micropuncture needle. This was performed with ultrasound. Guidewire would not advance centrally. 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 Next, ultrasound was performed of  the left neck. A left external jugular vein was demonstrated. Also under sterile conditions and local anesthesia, ultrasound micropuncture access performed of the left external jugular vein. Guidewire would not pass central to the hero access. Contrast injection confirms central left innominate occlusion secondary to the catheter. Guidewire inserted followed by a 6 French sheath. Kumpe catheter and Glidewire were advanced across the left innominate occlusion and advanced with some difficulty into the right atrium. Contrast injection confirms position in the right atrium. Guidewire exchanged for an Amplatz guidewire. Tract dilatation performed to insert a 24 cm temporary Trialysis catheter. Tip position in the right atrium. Contrast injection reconfirms position. Catheter secured with Prolene sutures. Blood aspirated easily followed by saline and heparin flushes. External caps applied followed by sterile dressing. No immediate complication. IMPRESSION: Successful ultrasound and fluoroscopic left external jugular  temporary Trialysis catheter. Tip proximal right atrium. Access ready for use. Right central innominate chronic venous occlusion. Electronically Signed   By: Jerilynn Mages.  Shick M.D.   On: 08/12/2015 08:49   Ir US Guide Vasc Access Left  08/12/2015  INDICATION: End-stage renal disease, occluded left upper extremity hero access, no current access, fever, possible infected access site. EXAM: IR LEFT FLOURO GUIDE CV LINE; IR ULTRASOUND GUIDANCE VASC ACCESS LEFT MEDICATIONS: Already given in the emergency room. ANESTHESIA/SEDATION: None. FLUOROSCOPY TIME:  Fluoroscopy Time: 4 minutes 36 seconds (46 mGy). COMPLICATIONS: None immediate. PROCEDURE: Informed written consent was obtained from the patient after a thorough discussion of the procedural risks, benefits and alternatives. All questions were addressed. Maximal Sterile Barrier Technique was utilized including caps, mask, sterile gowns, sterile gloves, sterile drape, hand hygiene and skin antiseptic. A timeout was performed prior to the initiation of the procedure. Initially, under sterile conditions and local anesthesia, a right external jugular vein was accessed with a micropuncture needle. This was performed with ultrasound. Guidewire would not advance centrally. 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 Next, ultrasound was performed of the left neck. A left external jugular vein was demonstrated. Also under sterile conditions and local anesthesia, ultrasound micropuncture access performed of the left external jugular vein. Guidewire would not pass central to the hero access. Contrast injection confirms central left innominate occlusion secondary to the catheter. Guidewire inserted followed by a 6 French sheath. Kumpe catheter and Glidewire were advanced across the left innominate occlusion and advanced with some difficulty into the right atrium. Contrast injection confirms position in the right atrium. Guidewire exchanged for an  Amplatz guidewire. Tract dilatation performed to insert a 24 cm temporary Trialysis catheter. Tip position in the right atrium. Contrast injection reconfirms position. Catheter secured with Prolene sutures. Blood aspirated easily followed by saline and heparin flushes. External caps applied followed by sterile dressing. No immediate complication. IMPRESSION: Successful ultrasound and fluoroscopic left external jugular temporary Trialysis catheter. Tip proximal right atrium. Access ready for use. Right central innominate chronic venous occlusion. Electronically Signed   By: Jerilynn Mages.  Shick M.D.   On: 08/12/2015 08:49   Dg Chest Port 1 View  08/18/2015  CLINICAL DATA:  Dialysis catheter EXAM: PORTABLE CHEST 1 VIEW COMPARISON:  08/12/2015 FINDINGS: Mild cardiomegaly. Vascular congestion. Low volumes. Bibasilar atelectasis. Left jugular central venous catheter removed. Left jugular hero catheter removed. Dialysis catheter has been placed from the lower extremity and the tip is in the right atrium. IMPRESSION: Bibasilar atelectasis Vascular congestion Dialysis catheter from the lower extremity has been placed with the tip in the right atrium. Electronically Signed   By: Rodena Goldmann.D.  On: 08/18/2015 13:17   Dg Fluoro Guide Cv Line-no Report  08/18/2015  CLINICAL DATA:  FLOURO GUIDE CV LINE Fluoroscopy was utilized by the requesting physician.  No radiographic interpretation.    2D Echo:  TTE (2/1) - Left ventricle: The cavity size was mildly dilated. Wall thickness was increased in a pattern of mild LVH. Systolic function was moderately to severely reduced. The estimated ejection fraction was in the range of 30% to 35%. Diffuse hypokinesis. Doppler parameters are consistent with abnormal left ventricular relaxation (grade 1 diastolic dysfunction). - Mitral valve: Calcified annulus. Mildly thickened leaflets . - Left atrium: The atrium was mildly dilated.  Impressions:  - There was no evidence  of a vegetation.  TEE (2/3) - Left ventricle: Systolic function was moderately to severely reduced. The estimated ejection fraction was in the range of 30% to 35%. Diffuse hypokinesis. - Aortic valve: No evidence of vegetation. - Mitral valve: No evidence of vegetation. - Left atrium: No evidence of thrombus in the appendage. - Right atrium: There is a vegetation in the right atrium, atached to the right atrium close to the insertion of the inferior vena cava. It measures 1.7cm in length. Thickened, mobile. This structure does not appear to be anatomically a eustachian valve (usually thin in character and fairly nonmobile). This vegeatation is not connected to newly inserted hemodialysis catheter from femoral vein. - Atrial septum: The septum bowed from left to right, consistent with increased left atrial pressure. - Tricuspid valve: No evidence of vegetation. - Pulmonic valve: No evidence of vegetation.  Impressions:  - Vegetation present in right atrium.  Admission HPI: Pt is a 69 Y O M with PMH- DM2, PVD with hx of BKA, ESRD on HD, hyperparathyroidism, presented today from [facility] office with fever- 101.4. Pt says he started feeling chills and having a fever on Wednesday, he did not check his temperature. He last went for dialysis on Wednesday, and then he was told that his HD access on his left upper arm was clotted off, and so he was following up with his vascular surgeon today. He also says he started having pain in his access area on Wednesday, but he denies differential warmth in the area, no redness. Patient says he started having some loose stools - on Wednesday also, with 2 episodes of diarrhea daily since it started, but he has had only one episode today, no blood and no black stools, no vomiting, no abdominal pain. Pt denies sick contacts, no myalgias or sorethroat, no SOB, cough or chest pain, no headaches, photophobia or neck pain or stiffness. He  does not make any urine.   Hospital Course by problem list: Septic Shock 2/2 MRSA Bacteremia and Endocarditis 2/2 AV Graft Infection: Patient met 4/4 SIRS criteria on admission and admitted to telemetry. He was initially started on vancomycin and zosyn, which was narrowed to vancomycin only on 1/29 after gram positive cocci were growing from blood cultures, which would eventually speciate to MRSA. He continued to have persistent fevers to 102.70F, and since no clear source was identified, the decision was made on 1/29 to remove clotted HeRO graft from left arm. This was removed on 1/30, with a post-operative course complicated by hypotension and PCCM was called. He was transferred to the ICU on 1/3 and started on pressors. Pressors were weaned off and by 2/2, he had been afebrile for 48 hours and his WBC down-trending, and he was transferred by to the medicine service. ID had been consulted, recommending  a TTE, which was showed no vegetation but an EF of 30-35%. A follow-up TEE indeed showed a vegetating in the right atrium. Repeat blood cultures from 2/1 showed no growth, and a 6 week course of vancomycin was continued from that date with plans to complete it with dialysis to 3/15 on discharge.  CHF (EF 30-35%):  Patient noted to have reduced EF on TTE querying a vegetation. An appointment was made with Cardiology as an outpatient for medical management and further workup. Toward discharge, he denied any leg swelling, chest pain, or dyspnea.  ESRD on Dialysis: Graft removal as above with new tunneled HD cath placed on 2/4. Continued on MWF schedule.  T2DM: Toward discharge, patient had CBGs in the 80-180 range on 15U Lantus qHS and SSI-S. Levemir 20U qhs was started on discharge.  GERD: Pantoprazole discontinued and famotidine started on discharge.   Discharge Vitals:   BP 127/79 mmHg  Pulse 102  Temp(Src) 98.2 F (36.8 C) (Oral)  Resp 19  Ht '6\' 1"'  (1.854 m)  Wt 232 lb 2.3 oz (105.3 kg)  BMI  30.63 kg/m2  SpO2 100%  Discharge Labs:  No results found for this or any previous visit (from the past 24 hour(s)).  Signed: Liberty Handy, MD 08/20/2015, 1:01 PM

## 2015-08-19 NOTE — Progress Notes (Signed)
[name] to be D/C'd Home per MD order.  Discussed prescriptions and follow up appointments with the patient. Prescriptions given to patient, medication list explained in detail. Pt verbalized understanding.    Medication List    STOP taking these medications        pantoprazole 40 MG tablet  Commonly known as:  PROTONIX      TAKE these medications        aspirin 81 MG EC tablet  Take 1 tablet (81 mg total) by mouth daily.     b complex-vitamin c-folic acid 0.8 MG Tabs tablet  Take 1 tablet by mouth at bedtime.     calcitRIOL 0.5 MCG capsule  Commonly known as:  ROCALTROL  Take 5 capsules (2.5 mcg total) by mouth every Monday, Wednesday, and Friday with hemodialysis.     calcium carbonate 500 MG chewable tablet  Commonly known as:  TUMS - dosed in mg elemental calcium  Chew 4 tablets (800 mg of elemental calcium total) by mouth 3 (three) times daily with meals.     famotidine 20 MG tablet  Commonly known as:  PEPCID  Take 1 tablet (20 mg total) by mouth at bedtime.     insulin detemir 100 UNIT/ML injection  Commonly known as:  LEVEMIR  Inject 0.2 mLs (20 Units total) into the skin at bedtime.     methocarbamol 500 MG tablet  Commonly known as:  ROBAXIN  Take 1 tablet (500 mg total) by mouth every 6 (six) hours as needed for muscle spasms.     oxyCODONE 5 MG immediate release tablet  Commonly known as:  Oxy IR/ROXICODONE  Take 1-2 tablets (5-10 mg total) by mouth every 3 (three) hours as needed for breakthrough pain.     vancomycin 1 GM/200ML Soln  Commonly known as:  VANCOCIN  Inject 200 mLs (1,000 mg total) into the vein every Monday, Wednesday, and Friday with hemodialysis. Stop Date: 09/27/15 as last dose        Filed Vitals:   08/19/15 1125 08/19/15 1234  BP: 100/58 127/79  Pulse: 94 102  Temp: 98 F (36.7 C) 98.2 F (36.8 C)  Resp: 18 19    Skin clean, dry and intact without evidence of skin break down, no evidence of skin tears noted. IV  catheter discontinued intact. Site without signs and symptoms of complications. Dressing and pressure applied. Pt denies pain at this time. No complaints noted.  An After Visit Summary was printed and given to the patient. Patient escorted via [place], and D/C home via private auto.  Carole Civil RN [facility] Phone [phone]

## 2015-08-19 NOTE — Progress Notes (Signed)
Subjective:  Patient was seen and examined this morning in HD. Patient denies any complaints, no fever, chills, shortness of breath. He is ready to go home.    Objective: Vital signs in last 24 hours: Filed Vitals:   08/19/15 1030 08/19/15 1100 08/19/15 1115 08/19/15 1125  BP: 100/59 88/60 87/62  100/58  Pulse: 86 100 96 94  Temp:    98 F (36.7 C)  TempSrc:    Oral  Resp: 16 18 16 18   Height:      Weight:    232 lb 2.3 oz (105.3 kg)  SpO2:    95%   Weight change: -1 oz (-0.029 kg)  Intake/Output Summary (Last 24 hours) at 08/19/15 1210 Last data filed at 08/19/15 1125  Gross per 24 hour  Intake    300 ml  Output   4381 ml  Net  -4081 ml   General: Vital signs reviewed.  Patient is in no acute distress and cooperative with exam.  Cardiovascular: RRR, S1 normal, S2 normal. Pulmonary/Chest: Clear to auscultation bilaterally, no wheezes, rales, or rhonchi.  Abdominal: Soft, non-tender, non-distended, BS +.  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 RLE temporary HD cath in use with HD. LUE well wrapped without exudate.  Skin: Warm, dry and intact.   Lab Results: Basic Metabolic Panel:  Recent Labs Lab 08/18/15 2020 08/19/15 0807  NA 134* 133*  K 4.4 4.3  CL 94* 93*  CO2 22 21*  GLUCOSE 180* 128*  BUN 48* 48*  CREATININE 11.05* 11.36*  CALCIUM 9.3 9.5  PHOS 8.7* 9.8*   Liver Function Tests:  Recent Labs Lab 08/18/15 2020 08/19/15 0807  ALBUMIN 2.2* 2.1*   CBC:  Recent Labs Lab 08/14/15 1815 08/16/15 0926  08/18/15 2020 08/19/15 0806  WBC 13.0* 11.6*  < > 12.9* 11.3*  NEUTROABS 8.7* 7.7  --   --   --   HGB 8.6* 7.7*  < > 8.6* 8.6*  HCT 26.1* 24.0*  < > 26.5* 26.2*  MCV 92.2 95.6  < > 94.0 94.2  PLT 161 270  < > 486* 449*  < > = values in this interval not displayed. Cardiac Enzymes:  Recent Labs Lab 08/14/15 1815 08/15/15 0041 08/15/15 0600  CKTOTAL 66  --   --   TROPONINI <0.03 0.04* <0.03   CBG:  Recent  Labs Lab 08/18/15 1031 08/18/15 1301 08/18/15 1414 08/18/15 1723 08/18/15 2216 08/18/15 2305  GLUCAP 158* 148* 138* 161* 170* 158*   Micro Results: Recent Results (from the past 240 hour(s))  Culture, blood (routine x 2)     Status: None   Collection Time: 08/11/15  9:16 AM  Result Value Ref Range Status   Specimen Description BLOOD RIGHT ANTECUBITAL  Final   Special Requests BOTTLES DRAWN AEROBIC AND ANAEROBIC 5CCS  Final   Culture  Setup Time   Final    GRAM POSITIVE COCCI IN CLUSTERS IN BOTH AEROBIC AND ANAEROBIC BOTTLES CRITICAL RESULT CALLED TO, READ BACK BY AND VERIFIED WITH: T WOODS 08/13/15 @ [id number] M VESTAL    Culture   Final    STAPHYLOCOCCUS AUREUS SUSCEPTIBILITIES PERFORMED ON PREVIOUS CULTURE WITHIN THE LAST 5 DAYS.    Report Status 08/15/2015 FINAL  Final  Culture, blood (routine x 2)     Status: None   Collection Time: 08/11/15  9:20 AM  Result Value Ref Range Status   Specimen Description BLOOD RIGHT ARM  Final   Special Requests BOTTLES DRAWN AEROBIC AND  ANAEROBIC 5CCS  Final   Culture  Setup Time   Final    GRAM POSITIVE COCCI IN CLUSTERS IN BOTH AEROBIC AND ANAEROBIC BOTTLES CRITICAL RESULT CALLED TO, READ BACK BY AND VERIFIED WITH: Alphonse Guild [id number] [place]    Culture METHICILLIN RESISTANT STAPHYLOCOCCUS AUREUS  Final   Report Status 08/15/2015 FINAL  Final   Organism ID, Bacteria METHICILLIN RESISTANT STAPHYLOCOCCUS AUREUS  Final      Susceptibility   Methicillin resistant staphylococcus aureus - MIC*    CIPROFLOXACIN >=8 RESISTANT Resistant     ERYTHROMYCIN >=8 RESISTANT Resistant     GENTAMICIN <=0.5 SENSITIVE Sensitive     OXACILLIN >=4 RESISTANT Resistant     TETRACYCLINE <=1 SENSITIVE Sensitive     VANCOMYCIN <=0.5 SENSITIVE Sensitive     TRIMETH/SULFA <=10 SENSITIVE Sensitive     CLINDAMYCIN <=0.25 SENSITIVE Sensitive     RIFAMPIN <=0.5 SENSITIVE Sensitive     Inducible Clindamycin NEGATIVE Sensitive     * METHICILLIN RESISTANT  STAPHYLOCOCCUS AUREUS  C difficile quick scan w PCR reflex     Status: None   Collection Time: 08/11/15  7:07 PM  Result Value Ref Range Status   C Diff antigen NEGATIVE NEGATIVE Final   C Diff toxin NEGATIVE NEGATIVE Final   C Diff interpretation Negative for toxigenic C. difficile  Final  MRSA PCR Screening     Status: Abnormal   Collection Time: 08/12/15  2:34 AM  Result Value Ref Range Status   MRSA by PCR POSITIVE (A) NEGATIVE Final    Comment:        The GeneXpert MRSA Assay (FDA approved for NASAL specimens only), is one component of a comprehensive MRSA colonization surveillance program. It is not intended to diagnose MRSA infection nor to guide or monitor treatment for MRSA infections. RESULT CALLED TO, READ BACK BY AND VERIFIED WITH: [place] @0639  08/13/15 MKELLY   Surgical pcr screen     Status: Abnormal   Collection Time: 08/14/15  6:11 AM  Result Value Ref Range Status   MRSA, PCR POSITIVE (A) NEGATIVE Final    Comment: RESULT CALLED TO, READ BACK BY AND VERIFIED WITH: L. [facility] RN 11:00 08/14/15 ([email or web address])    Staphylococcus aureus POSITIVE (A) NEGATIVE Final    Comment:        The Xpert SA Assay (FDA approved for NASAL specimens in patients over 75 years of age), is one component of a comprehensive surveillance program.  Test performance has been validated by [facility] for patients greater than or equal to 34 year old. It is not intended to diagnose infection nor to guide or monitor treatment.   Anaerobic culture     Status: None   Collection Time: 08/14/15 12:19 PM  Result Value Ref Range Status   Specimen Description WOUND ARM LEFT  Final   Special Requests PERIGRAFT/PT ON VANC  Final   Gram Stain   Final    FEW WBC PRESENT,BOTH PMN AND MONONUCLEAR NO SQUAMOUS EPITHELIAL CELLS SEEN NO ORGANISMS SEEN Performed at [facility]    Culture   Final    NO ANAEROBES ISOLATED Performed at [facility]    Report  Status 08/19/2015 FINAL  Final  Wound culture     Status: None   Collection Time: 08/14/15 12:19 PM  Result Value Ref Range Status   Specimen Description WOUND ARM LEFT  Final   Special Requests PERIGRAFT/PT ON VANC  Final   Gram Stain   Final  FEW WBC PRESENT,BOTH PMN AND MONONUCLEAR NO SQUAMOUS EPITHELIAL CELLS SEEN NO ORGANISMS SEEN Performed at [facility]    Culture   Final    MODERATE METHICILLIN RESISTANT STAPHYLOCOCCUS AUREUS Note: RIFAMPIN AND GENTAMICIN SHOULD NOT BE USED AS SINGLE DRUGS FOR TREATMENT OF STAPH INFECTIONS. This organism DOES NOT demonstrate inducible Clindamycin resistance in vitro. RBC STEPHNIE F 2/2 @1000  BY REAMM Performed at [facility]    Report Status 08/17/2015 FINAL  Final   Organism ID, Bacteria METHICILLIN RESISTANT STAPHYLOCOCCUS AUREUS  Final      Susceptibility   Methicillin resistant staphylococcus aureus - MIC*    CLINDAMYCIN <=0.25 SENSITIVE Sensitive     ERYTHROMYCIN >=8 RESISTANT Resistant     GENTAMICIN <=0.5 SENSITIVE Sensitive     LEVOFLOXACIN 4 INTERMEDIATE Intermediate     OXACILLIN >=4 RESISTANT Resistant     RIFAMPIN <=0.5 SENSITIVE Sensitive     TRIMETH/SULFA <=10 SENSITIVE Sensitive     VANCOMYCIN 1 SENSITIVE Sensitive     TETRACYCLINE <=1 SENSITIVE Sensitive     * MODERATE METHICILLIN RESISTANT STAPHYLOCOCCUS AUREUS  Culture, blood (Routine X 2) w Reflex to ID Panel     Status: None   Collection Time: 08/14/15  3:45 PM  Result Value Ref Range Status   Specimen Description BLOOD RIGHT HAND  Final   Special Requests BOTTLES DRAWN AEROBIC ONLY 2CC  Final   Culture  Setup Time   Final    GRAM POSITIVE COCCI IN CLUSTERS PEDIATRIC BOTTLE CRITICAL RESULT CALLED TO, READ BACK BY AND VERIFIED WITH: C. FOWLER,RN AT [id number] ON [id number] BY Rhea Bleacher    Culture   Final    STAPHYLOCOCCUS AUREUS SUSCEPTIBILITIES PERFORMED ON PREVIOUS CULTURE WITHIN THE LAST 5 DAYS.    Report Status 08/16/2015 FINAL  Final  Culture,  blood (Routine X 2) w Reflex to ID Panel     Status: None   Collection Time: 08/14/15  4:00 PM  Result Value Ref Range Status   Specimen Description BLOOD RIGHT HAND  Final   Special Requests BOTTLES DRAWN AEROBIC ONLY 1CC  Final   Culture  Setup Time   Final    GRAM POSITIVE COCCI IN CLUSTERS PEDIATRIC BOTTLE CRITICAL RESULT CALLED TO, READ BACK BY AND VERIFIED WITH: C. FOWLER,RN AT [id number] ON [id number] BY Rhea Bleacher    Culture   Final    STAPHYLOCOCCUS AUREUS SUSCEPTIBILITIES PERFORMED ON PREVIOUS CULTURE WITHIN THE LAST 5 DAYS.    Report Status 08/16/2015 FINAL  Final  Culture, blood (routine x 2)     Status: None (Preliminary result)   Collection Time: 08/16/15 10:38 AM  Result Value Ref Range Status   Specimen Description BLOOD URINE, CATHETERIZED  Final   Special Requests BOTTLES DRAWN AEROBIC AND ANAEROBIC 10CC  Final   Culture NO GROWTH 2 DAYS  Final   Report Status PENDING  Incomplete  Culture, blood (routine x 2)     Status: None (Preliminary result)   Collection Time: 08/16/15 10:55 AM  Result Value Ref Range Status   Specimen Description BLOOD URINE, CATHETERIZED  Final   Special Requests BOTTLES DRAWN AEROBIC AND ANAEROBIC 10CC  Final   Culture NO GROWTH 2 DAYS  Final   Report Status PENDING  Incomplete   Studies/Results: Dg Chest Port 1 View  08/18/2015  CLINICAL DATA:  Dialysis catheter EXAM: PORTABLE CHEST 1 VIEW COMPARISON:  08/12/2015 FINDINGS: Mild cardiomegaly. Vascular congestion. Low volumes. Bibasilar atelectasis. Left jugular central venous catheter removed. Left jugular hero catheter removed.  Dialysis catheter has been placed from the lower extremity and the tip is in the right atrium. IMPRESSION: Bibasilar atelectasis Vascular congestion Dialysis catheter from the lower extremity has been placed with the tip in the right atrium. Electronically Signed   By: Marybelle Killings M.D.   On: 08/18/2015 13:17   Dg Fluoro Guide Cv Line-no Report  08/18/2015  CLINICAL DATA:   FLOURO GUIDE CV LINE Fluoroscopy was utilized by the requesting physician.  No radiographic interpretation.   Medications:  I have reviewed the patient's current medications. Prior to Admission:  Prescriptions prior to admission  Medication Sig Dispense Refill Last Dose  . aspirin EC 325 MG EC tablet Take 1 tablet (325 mg total) by mouth daily. 30 tablet 0 08/10/2015 at Unknown time  . b complex-vitamin c-folic acid (NEPHRO-VITE) 0.8 MG TABS tablet Take 1 tablet by mouth at bedtime. 30 tablet 0 08/10/2015 at Unknown time  . calcitRIOL (ROCALTROL) 0.5 MCG capsule Take 5 capsules (2.5 mcg total) by mouth every Monday, Wednesday, and Friday with hemodialysis. 30 capsule 0 08/10/2015 at Unknown time  . calcium carbonate (TUMS - DOSED IN MG ELEMENTAL CALCIUM) 500 MG chewable tablet Chew 4 tablets (800 mg of elemental calcium total) by mouth 3 (three) times daily with meals.   08/10/2015 at Unknown time  . famotidine (PEPCID) 20 MG tablet Take 1 tablet (20 mg total) by mouth at bedtime. 30 tablet 0 08/10/2015 at Unknown time  . insulin detemir (LEVEMIR) 100 UNIT/ML injection Inject 0.2 mLs (20 Units total) into the skin at bedtime. 10 mL 11 08/10/2015 at Unknown time  . methocarbamol (ROBAXIN) 500 MG tablet Take 1 tablet (500 mg total) by mouth every 6 (six) hours as needed for muscle spasms. 60 tablet 0 08/10/2015 at Unknown time  . oxyCODONE (OXY IR/ROXICODONE) 5 MG immediate release tablet Take 1-2 tablets (5-10 mg total) by mouth every 3 (three) hours as needed for breakthrough pain. 90 tablet 0 08/10/2015 at Unknown time  . pantoprazole (PROTONIX) 40 MG tablet Take 1 tablet (40 mg total) by mouth 2 (two) times daily. 60 tablet 1 08/10/2015 at Unknown time   Scheduled Meds: . aspirin EC  81 mg Oral Daily  . calcitRIOL  2.5 mcg Oral Q M,W,F-HD  . calcium carbonate  4 tablet Oral TID WC  . darbepoetin (ARANESP) injection - DIALYSIS  150 mcg Intravenous Q Wed-HD  . heparin  5,000 Units Subcutaneous 3 times  per day  . insulin aspart  0-9 Units Subcutaneous TID WC  . insulin detemir  15 Units Subcutaneous QHS  . midodrine  10 mg Oral TID WC  . multivitamin  1 tablet Oral QHS  . sodium chloride flush  3 mL Intravenous Q12H  . 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 TEE demonstrated right atrial vegetation suggestive of endocarditis. Patient will continue his 6 week course of Vancomycin following his HD sessions.  -Repeat BCx from 2/1 NGTD -Continue 6 weeks of IV vancomycin therapy dosed after HD (Start 08/16/15; Stop  09/26/15) -Patient will follow up with ID in 4-6 weeks  Newly Diagnosed Systolic CHF (EF [id number]): Newly detected during this admission, without priors for comparison. Patient is without dyspnea, LEE, or crackles on examination. -Close outpatient follow-up, already had an appointment for 2/21 with Cardiology for medication management and further work up for ischemic causes of CHF -Will start low dose BB on discharge  ESRD on HD MWF: Currently in HD this morning. Right femoral catheter placed by [facility]. New LUE AV T2DM: Well controlled on Lantus 15 units QHS and SSI-S. Will discharge to home on home medications.  -Continue Lantus 15 units QHS  -Continue SSI-S -CBG ACHS  DVT/PE ppx: Heparin SQ TID.   Dispo:  Anticipated discharge in approximately 0 day(s).   The patient does have a current PCP Delrae Rend, MD) and does not need an [facility] hospital  follow-up appointment after discharge.  The patient does have transportation limitations that hinder transportation to clinic appointments.    LOS: 8 days   Osa Craver, DO PGY-2 Internal Medicine Resident Pager # [phone] 08/19/2015 12:10 PM

## 2015-08-19 NOTE — Progress Notes (Signed)
Internal Medicine Attending  Date: 08/19/2015  Patient name: [name] Medical record number: [id number] Date of birth: 06-21-1966 Age: 50 y.o. Gender: male  I saw and evaluated the patient. I reviewed the resident's note by Dr. Marvel Plan and I agree with the resident's findings and plans as documented in her progress note.  [name] was seen as he was completing his dialysis treatment this morning. He was comfortable and is stable for discharge home. He will complete 6 weeks of IV vancomycin that will be administered during his hemodialysis treatments.

## 2015-08-19 NOTE — Procedures (Signed)
Patient was seen on dialysis and the procedure was supervised.  BFR 350  Via PC BP is  113/46.   Patient appears to be tolerating treatment well  [name] A 08/19/2015

## 2015-08-19 NOTE — Progress Notes (Signed)
Pharmacy Antibiotic Note  [name] is a 50 y.o. male admitted on 08/11/2015 with MRSA bacteremia and with atrial vegetation.  Pharmacy has been consulted for vancomycin dosing. Plan is to treat for 6 weeks with day 1 as 2/1 (end date 3/14). Had negligible HD session yesterday, for full session today. Likely home today.  Plan: Continue vancomycin 1g IV QHD-MWF, sent dose to HD today since off schedule Monitor clinical picture, HD sessions, pre-HD VR   Height: 6\' 1"  (185.4 cm) Weight: 250 lb 3.6 oz (113.5 kg) IBW/kg (Calculated) : 79.9  Temp (24hrs), Avg:98.3 F (36.8 C), Min:98 F (36.7 C), Max:98.6 F (37 C)   Recent Labs Lab 08/14/15 1814  08/16/15 0926 08/16/15 1844 08/17/15 0230 08/18/15 0540 08/18/15 2020 08/19/15 0806 08/19/15 0807  WBC  --   < > 11.6*  --  12.0* 10.7* 12.9* 11.3*  --   CREATININE  --   < > 10.03*  --  7.10* 10.10* 11.05*  --  11.36*  LATICACIDVEN 0.6  --   --   --   --   --   --   --   --   VANCORANDOM  --   --   --  27  --   --   --   --   --   < > = values in this interval not displayed.  Estimated Creatinine Clearance: 10.2 mL/min (by C-G formula based on Cr of 11.36).    No Known Allergies  Antimicrobials this admission: Vanc 1/27 >> Zosyn 1/27 >>1/29 Cefazolin 1/31  Dose adjustments this admission: 2/1 VR = 27  Microbiology results: 1/27 CDiff >> neg  1/27 BCx >> MRSA (Vanc MIC </= 0.5)  1/28 MRSA PCR >> positive  1/30 WCx >> MRSA  1/30 BCx >> MRSA  2/1 BCx >> ngtd   Thank you for allowing pharmacy to be a part of this patient's care.  [place], Pharm.D., BCPS Clinical Pharmacist Pager: [phone] 08/19/2015 10:16 AM

## 2015-08-19 NOTE — Discharge Instructions (Signed)
PLEASE BE SURE TO GO TO YOUR DIALYSIS IN ORDER TO RECEIVE YOUR ANTIBIOTICS.  DURING THIS ADMISSION, YOU HAD A BACTERIA IN YOUR BLOOD THAT CAUSED AN INFECTION WITHIN YOUR HEART. YOU WILL RECEIVE ANTIBIOTICS FOR THIS WITH HEMODIALYSIS.   IT WILL BE VERY IMPORTANT FOR YOU TO GO TO YOUR CARDIOLOGY APPOINTMENT LISTED IN THIS PACKET FOR FOLLOW UP FOR YOUR HEART FAILURE.  YOU WILL ALSO NEED TO MAKE AN APPOINTMENT WITH THE INFECTIOUS DISEASE DOCTOR (DR. Baxter Flattery) TO FOLLOW UP FOR YOUR INFECTION. THIS NUMBER IS LISTED IN THIS PACKED FOR YOU. Bacteremia Bacteremia is the presence of bacteria in the blood. A small amount of bacteria may not cause any symptoms. Sometimes, the bacteria spread and cause infection in other parts of the body, such as the heart, joints, bones, or brain. Having a great amount of bacteria can cause a serious, sometimes life-threatening infection called sepsis. CAUSES This condition is caused by bacteria that get into the blood. Bacteria can enter the blood:  During a dental or medical procedure.  After you brush your teeth so hard that the gums bleed.  Through a scrape or cut on your skin. More severe types of bacteremia can be caused by:  A bacterial infection, such as pneumonia, that spreads to the blood.  Using a dirty needle. RISK FACTORS This condition is more likely to develop in:  Children and elderly adults.  People who have a long-lasting (chronic) disease or medical condition.  People who have an artificial joint or heart valve.  People who have heart valve disease.  People who have a tube, such as a catheter or IV tube, that has been inserted for a medical treatment.  People who have a weak body defense system (immune system).  People who use IV drugs. SYMPTOMS Usually, this condition does not cause symptoms when it is mild. When it is more serious, it may cause:  Fever.  Chills.  Racing heart.  Shortness of  breath.  Dizziness.  Weakness.  Confusion.  Nausea or vomiting.  Diarrhea. Bacteremia that has spread to other parts of the body may cause symptoms in those areas. DIAGNOSIS This condition may be diagnosed with a physical exam and tests, such as:  A complete blood count (CBC). This test looks for signs of infection.  Blood cultures. These look for bacteria in your blood.  Tests of any IV tubes. These look for a source of infection.  Urine tests.  Imaging tests, such as an X-ray, CT scan, MRI, or heart ultrasound. TREATMENT If the condition is mild, treatment is usually not needed. Usually, the body's immune system will remove the bacteria. If the condition is more serious, it may be treated with:  Antibiotic medicines through an IV tube. These may be given for about 2 weeks. At first, the antibiotic that is given may kill most types of blood bacteria. If your test results show that a certain kind of bacteria is causing problems, the antibiotic may be changed to kill only the bacteria that are causing problems.  Antibiotics taken by mouth.  Removing any catheter or IV tube that is a source of infection.  Blood pressure and breathing support, if needed.  Surgery to control the source or spread of infection, if needed. HOME CARE INSTRUCTIONS  Take over-the-counter and prescription medicines only as told by your health care provider.  If you were prescribed an antibiotic, take it as told by your health care provider. Do not stop taking the antibiotic even if you start  to feel better.  Rest at home until your condition is under control.  Drink enough fluid to keep your urine clear or pale yellow.  Keep all follow-up visits as told by your health care provider. This is important. PREVENTION Take these actions to help prevent future episodes of bacteremia:  Get all vaccinations as recommended by your health care provider.  Clean and cover scrapes or cuts.  Bathe  regularly.  Wash your hands often.  Before any dental or surgical procedure, ask your health care provider if you should take an antibiotic. SEEK MEDICAL CARE IF:  Your symptoms get worse.  You continue to have symptoms after treatment.  You develop new symptoms after treatment. SEEK IMMEDIATE MEDICAL CARE IF:  You have chest pain or trouble breathing.  You develop confusion, dizziness, or weakness.  You develop pale skin.   This information is not intended to replace advice given to you by your health care provider. Make sure you discuss any questions you have with your health care provider.   Document Released: 04/14/2006 Document Revised: 03/22/2015 Document Reviewed: 09/03/2014 Elsevier Interactive Patient Education [employer].

## 2015-08-21 ENCOUNTER — Ambulatory Visit: Payer: Medicare Other | Admitting: Family

## 2015-08-21 ENCOUNTER — Telehealth: Payer: Self-pay | Admitting: Vascular Surgery

## 2015-08-21 DIAGNOSIS — D631 Anemia in chronic kidney disease: Secondary | ICD-10-CM | POA: Diagnosis not present

## 2015-08-21 DIAGNOSIS — A4102 Sepsis due to Methicillin resistant Staphylococcus aureus: Secondary | ICD-10-CM | POA: Insufficient documentation

## 2015-08-21 DIAGNOSIS — E209 Hypoparathyroidism, unspecified: Secondary | ICD-10-CM | POA: Diagnosis not present

## 2015-08-21 DIAGNOSIS — D509 Iron deficiency anemia, unspecified: Secondary | ICD-10-CM | POA: Diagnosis not present

## 2015-08-21 DIAGNOSIS — N186 End stage renal disease: Secondary | ICD-10-CM | POA: Diagnosis not present

## 2015-08-21 LAB — CULTURE, BLOOD (ROUTINE X 2)
CULTURE: NO GROWTH
Culture: NO GROWTH

## 2015-08-21 NOTE — Anesthesia Postprocedure Evaluation (Signed)
Anesthesia Post Note  Patient: [name]  Procedure(s) Performed: Procedure(s) (LRB): INSERTION OF DIALYSIS CATHETER RIGHT FEMORAL VEIN (N/A) TRANSESOPHAGEAL ECHOCARDIOGRAM (TEE) (N/A)  Patient location during evaluation: PACU Anesthesia Type: MAC Level of consciousness: awake Pain management: pain level controlled Vital Signs Assessment: post-procedure vital signs reviewed and stable Respiratory status: spontaneous breathing Cardiovascular status: stable Postop Assessment: no signs of nausea or vomiting Anesthetic complications: no    Last Vitals:  Filed Vitals:   08/19/15 1125 08/19/15 1234  BP: 100/58 127/79  Pulse: 94 102  Temp: 36.7 C 36.8 C  Resp: 18 19    Last Pain:  Filed Vitals:   08/19/15 1256  PainSc: 8                  Teckla Christiansen

## 2015-08-21 NOTE — Telephone Encounter (Signed)
-----   Message from Mena Goes, RN sent at 08/21/2015 10:23 AM EST -----   ----- Message -----    From: Elam Dutch, MD    Sent: 08/20/2015  11:04 AM      To: [facility] Charge Pool  This patient was sent home yesterday by medical service with a drain still in his arm.  Can you get him an appt with Vinnie Level tomorrow to get this out  Thanks  Juanda Crumble

## 2015-08-21 NOTE — Telephone Encounter (Signed)
Tried calling pt first thing this morning- LM with appt for 1:15 today. Pt did not call back, did not show.  Just lm again for pt to cb ASAP to reschedule this drain removal. dpm

## 2015-08-22 ENCOUNTER — Encounter (HOSPITAL_COMMUNITY): Payer: Self-pay | Admitting: Vascular Surgery

## 2015-08-22 NOTE — Telephone Encounter (Signed)
Attempted to call patient again today. I left another vm on his home #. His cell #'s VM is full.

## 2015-08-23 DIAGNOSIS — D631 Anemia in chronic kidney disease: Secondary | ICD-10-CM | POA: Diagnosis not present

## 2015-08-23 DIAGNOSIS — D509 Iron deficiency anemia, unspecified: Secondary | ICD-10-CM | POA: Diagnosis not present

## 2015-08-23 DIAGNOSIS — A4102 Sepsis due to Methicillin resistant Staphylococcus aureus: Secondary | ICD-10-CM | POA: Diagnosis not present

## 2015-08-23 DIAGNOSIS — E209 Hypoparathyroidism, unspecified: Secondary | ICD-10-CM | POA: Diagnosis not present

## 2015-08-23 DIAGNOSIS — N186 End stage renal disease: Secondary | ICD-10-CM | POA: Diagnosis not present

## 2015-08-25 DIAGNOSIS — A4102 Sepsis due to Methicillin resistant Staphylococcus aureus: Secondary | ICD-10-CM | POA: Diagnosis not present

## 2015-08-25 DIAGNOSIS — E209 Hypoparathyroidism, unspecified: Secondary | ICD-10-CM | POA: Diagnosis not present

## 2015-08-25 DIAGNOSIS — N186 End stage renal disease: Secondary | ICD-10-CM | POA: Diagnosis not present

## 2015-08-25 DIAGNOSIS — D631 Anemia in chronic kidney disease: Secondary | ICD-10-CM | POA: Diagnosis not present

## 2015-08-25 DIAGNOSIS — D509 Iron deficiency anemia, unspecified: Secondary | ICD-10-CM | POA: Diagnosis not present

## 2015-08-28 DIAGNOSIS — E209 Hypoparathyroidism, unspecified: Secondary | ICD-10-CM | POA: Diagnosis not present

## 2015-08-28 DIAGNOSIS — D509 Iron deficiency anemia, unspecified: Secondary | ICD-10-CM | POA: Diagnosis not present

## 2015-08-28 DIAGNOSIS — N186 End stage renal disease: Secondary | ICD-10-CM | POA: Diagnosis not present

## 2015-08-28 DIAGNOSIS — D631 Anemia in chronic kidney disease: Secondary | ICD-10-CM | POA: Diagnosis not present

## 2015-08-28 DIAGNOSIS — A4102 Sepsis due to Methicillin resistant Staphylococcus aureus: Secondary | ICD-10-CM | POA: Diagnosis not present

## 2015-08-30 DIAGNOSIS — A4102 Sepsis due to Methicillin resistant Staphylococcus aureus: Secondary | ICD-10-CM | POA: Diagnosis not present

## 2015-08-30 DIAGNOSIS — D631 Anemia in chronic kidney disease: Secondary | ICD-10-CM | POA: Diagnosis not present

## 2015-08-30 DIAGNOSIS — N186 End stage renal disease: Secondary | ICD-10-CM | POA: Diagnosis not present

## 2015-08-30 DIAGNOSIS — E209 Hypoparathyroidism, unspecified: Secondary | ICD-10-CM | POA: Diagnosis not present

## 2015-08-30 DIAGNOSIS — D509 Iron deficiency anemia, unspecified: Secondary | ICD-10-CM | POA: Diagnosis not present

## 2015-08-30 NOTE — Telephone Encounter (Signed)
Phone call to [facility]; spoke with nurse, Ivin Booty.  Informed that our office has not been successful in reaching pt. Per phone, to schedule office appt. to remove [place] drain in left arm.  Advised that pt. Hasn't returned phone call.  Stated she will advise the Charge Nurse to talk with pt., and encourage him to call the office to schedule appt.

## 2015-08-30 NOTE — Telephone Encounter (Addendum)
I have also tried to reach patient but have not been successful. I checked to see if he was on any of the Inpatient hospital lists at any of the [facility] affiliated hospitals. No listing.

## 2015-08-30 NOTE — Telephone Encounter (Signed)
I have been unable to reach Mr [name] regarding drain removal. I cannot reach him at either # and I haven't been able to reach his emergency contact. FYI.

## 2015-09-01 DIAGNOSIS — D631 Anemia in chronic kidney disease: Secondary | ICD-10-CM | POA: Diagnosis not present

## 2015-09-01 DIAGNOSIS — N186 End stage renal disease: Secondary | ICD-10-CM | POA: Diagnosis not present

## 2015-09-01 DIAGNOSIS — D509 Iron deficiency anemia, unspecified: Secondary | ICD-10-CM | POA: Diagnosis not present

## 2015-09-01 DIAGNOSIS — E209 Hypoparathyroidism, unspecified: Secondary | ICD-10-CM | POA: Diagnosis not present

## 2015-09-01 DIAGNOSIS — A4102 Sepsis due to Methicillin resistant Staphylococcus aureus: Secondary | ICD-10-CM | POA: Diagnosis not present

## 2015-09-04 DIAGNOSIS — D509 Iron deficiency anemia, unspecified: Secondary | ICD-10-CM | POA: Diagnosis not present

## 2015-09-04 DIAGNOSIS — E209 Hypoparathyroidism, unspecified: Secondary | ICD-10-CM | POA: Diagnosis not present

## 2015-09-04 DIAGNOSIS — A4102 Sepsis due to Methicillin resistant Staphylococcus aureus: Secondary | ICD-10-CM | POA: Diagnosis not present

## 2015-09-04 DIAGNOSIS — D631 Anemia in chronic kidney disease: Secondary | ICD-10-CM | POA: Diagnosis not present

## 2015-09-04 DIAGNOSIS — N186 End stage renal disease: Secondary | ICD-10-CM | POA: Diagnosis not present

## 2015-09-05 ENCOUNTER — Encounter: Payer: Medicare Other | Admitting: Cardiology

## 2015-09-06 DIAGNOSIS — D509 Iron deficiency anemia, unspecified: Secondary | ICD-10-CM | POA: Diagnosis not present

## 2015-09-06 DIAGNOSIS — N186 End stage renal disease: Secondary | ICD-10-CM | POA: Diagnosis not present

## 2015-09-06 DIAGNOSIS — E209 Hypoparathyroidism, unspecified: Secondary | ICD-10-CM | POA: Diagnosis not present

## 2015-09-06 DIAGNOSIS — A4102 Sepsis due to Methicillin resistant Staphylococcus aureus: Secondary | ICD-10-CM | POA: Diagnosis not present

## 2015-09-06 DIAGNOSIS — D631 Anemia in chronic kidney disease: Secondary | ICD-10-CM | POA: Diagnosis not present

## 2015-09-08 DIAGNOSIS — E209 Hypoparathyroidism, unspecified: Secondary | ICD-10-CM | POA: Diagnosis not present

## 2015-09-08 DIAGNOSIS — D631 Anemia in chronic kidney disease: Secondary | ICD-10-CM | POA: Diagnosis not present

## 2015-09-08 DIAGNOSIS — N186 End stage renal disease: Secondary | ICD-10-CM | POA: Diagnosis not present

## 2015-09-08 DIAGNOSIS — D509 Iron deficiency anemia, unspecified: Secondary | ICD-10-CM | POA: Diagnosis not present

## 2015-09-08 DIAGNOSIS — A4102 Sepsis due to Methicillin resistant Staphylococcus aureus: Secondary | ICD-10-CM | POA: Diagnosis not present

## 2015-09-11 DIAGNOSIS — D631 Anemia in chronic kidney disease: Secondary | ICD-10-CM | POA: Diagnosis not present

## 2015-09-11 DIAGNOSIS — A4102 Sepsis due to Methicillin resistant Staphylococcus aureus: Secondary | ICD-10-CM | POA: Diagnosis not present

## 2015-09-11 DIAGNOSIS — E209 Hypoparathyroidism, unspecified: Secondary | ICD-10-CM | POA: Diagnosis not present

## 2015-09-11 DIAGNOSIS — N186 End stage renal disease: Secondary | ICD-10-CM | POA: Diagnosis not present

## 2015-09-11 DIAGNOSIS — D509 Iron deficiency anemia, unspecified: Secondary | ICD-10-CM | POA: Diagnosis not present

## 2015-09-12 ENCOUNTER — Emergency Department (HOSPITAL_COMMUNITY): Payer: Medicare Other

## 2015-09-12 ENCOUNTER — Observation Stay (HOSPITAL_COMMUNITY)
Admission: EM | Admit: 2015-09-12 | Discharge: 2015-09-12 | Disposition: A | Discharge status: Home / Self Care | Payer: Medicare Other | Attending: Student in an Organized Health Care Education/Training Program | Admitting: Student in an Organized Health Care Education/Training Program

## 2015-09-12 ENCOUNTER — Encounter (HOSPITAL_COMMUNITY): Payer: Self-pay | Admitting: Emergency Medicine

## 2015-09-12 DIAGNOSIS — I330 Acute and subacute infective endocarditis: Secondary | ICD-10-CM | POA: Diagnosis not present

## 2015-09-12 DIAGNOSIS — B9562 Methicillin resistant Staphylococcus aureus infection as the cause of diseases classified elsewhere: Secondary | ICD-10-CM

## 2015-09-12 DIAGNOSIS — I5022 Chronic systolic (congestive) heart failure: Secondary | ICD-10-CM | POA: Diagnosis not present

## 2015-09-12 DIAGNOSIS — Z794 Long term (current) use of insulin: Secondary | ICD-10-CM | POA: Insufficient documentation

## 2015-09-12 DIAGNOSIS — K219 Gastro-esophageal reflux disease without esophagitis: Secondary | ICD-10-CM | POA: Diagnosis not present

## 2015-09-12 DIAGNOSIS — R0789 Other chest pain: Secondary | ICD-10-CM | POA: Diagnosis not present

## 2015-09-12 DIAGNOSIS — I120 Hypertensive chronic kidney disease with stage 5 chronic kidney disease or end stage renal disease: Secondary | ICD-10-CM | POA: Diagnosis not present

## 2015-09-12 DIAGNOSIS — N186 End stage renal disease: Secondary | ICD-10-CM | POA: Insufficient documentation

## 2015-09-12 DIAGNOSIS — E119 Type 2 diabetes mellitus without complications: Secondary | ICD-10-CM | POA: Insufficient documentation

## 2015-09-12 DIAGNOSIS — R000 Tachycardia, unspecified: Secondary | ICD-10-CM | POA: Diagnosis not present

## 2015-09-12 DIAGNOSIS — R079 Chest pain, unspecified: Principal | ICD-10-CM | POA: Diagnosis present

## 2015-09-12 DIAGNOSIS — Z7982 Long term (current) use of aspirin: Secondary | ICD-10-CM | POA: Diagnosis not present

## 2015-09-12 DIAGNOSIS — E213 Hyperparathyroidism, unspecified: Secondary | ICD-10-CM | POA: Diagnosis not present

## 2015-09-12 DIAGNOSIS — J40 Bronchitis, not specified as acute or chronic: Secondary | ICD-10-CM | POA: Insufficient documentation

## 2015-09-12 DIAGNOSIS — Z992 Dependence on renal dialysis: Secondary | ICD-10-CM | POA: Insufficient documentation

## 2015-09-12 DIAGNOSIS — G8929 Other chronic pain: Secondary | ICD-10-CM

## 2015-09-12 DIAGNOSIS — Z87891 Personal history of nicotine dependence: Secondary | ICD-10-CM | POA: Diagnosis not present

## 2015-09-12 DIAGNOSIS — Z79899 Other long term (current) drug therapy: Secondary | ICD-10-CM | POA: Insufficient documentation

## 2015-09-12 DIAGNOSIS — Z89512 Acquired absence of left leg below knee: Secondary | ICD-10-CM

## 2015-09-12 DIAGNOSIS — E1122 Type 2 diabetes mellitus with diabetic chronic kidney disease: Secondary | ICD-10-CM

## 2015-09-12 DIAGNOSIS — E1129 Type 2 diabetes mellitus with other diabetic kidney complication: Secondary | ICD-10-CM | POA: Diagnosis not present

## 2015-09-12 LAB — BASIC METABOLIC PANEL
Anion gap: 17 — ABNORMAL HIGH (ref 5–15)
BUN: 40 mg/dL — AB (ref 6–20)
CHLORIDE: 94 mmol/L — AB (ref 101–111)
CO2: 26 mmol/L (ref 22–32)
Calcium: 9.1 mg/dL (ref 8.9–10.3)
Creatinine, Ser: 9.39 mg/dL — ABNORMAL HIGH (ref 0.61–1.24)
GFR calc Af Amer: 7 mL/min — ABNORMAL LOW (ref >60)
GFR calc non Af Amer: 6 mL/min — ABNORMAL LOW (ref >60)
Glucose, Bld: 229 mg/dL — ABNORMAL HIGH (ref 65–99)
POTASSIUM: 4.6 mmol/L (ref 3.5–5.1)
SODIUM: 137 mmol/L (ref 135–145)

## 2015-09-12 LAB — CBC
HCT: 30.3 % — ABNORMAL LOW (ref 39.0–52.0)
HEMOGLOBIN: 9.3 g/dL — AB (ref 13.0–17.0)
MCH: 30.1 pg (ref 26.0–34.0)
MCHC: 30.7 g/dL (ref 30.0–36.0)
MCV: 98.1 fL (ref 78.0–100.0)
Platelets: 240 10*3/uL (ref 150–400)
RBC: 3.09 MIL/uL — AB (ref 4.22–5.81)
RDW: 18.1 % — ABNORMAL HIGH (ref 11.5–15.5)
WBC: 7.7 10*3/uL (ref 4.0–10.5)

## 2015-09-12 LAB — GLUCOSE, CAPILLARY
GLUCOSE-CAPILLARY: 261 mg/dL — AB (ref 65–99)
GLUCOSE-CAPILLARY: 171 mg/dL — AB (ref 65–99)
Glucose-Capillary: 199 mg/dL — ABNORMAL HIGH (ref 65–99)

## 2015-09-12 LAB — I-STAT TROPONIN, ED: TROPONIN I, POC: 0.01 ng/mL (ref 0.00–0.08)

## 2015-09-12 LAB — TROPONIN I
Troponin I: <0.03 ng/mL (ref <0.031)
Troponin I: <0.03 ng/mL (ref <0.031)

## 2015-09-12 MED ORDER — OXYCODONE HCL 5 MG PO TABS: 5.0000 mg | ORAL_TABLET | ORAL | Status: DC | PRN

## 2015-09-12 MED ORDER — FAMOTIDINE 20 MG PO TABS: 20.0000 mg | ORAL_TABLET | Freq: Every day | ORAL | Status: DC

## 2015-09-12 MED ORDER — INSULIN DETEMIR 100 UNIT/ML ~~LOC~~ SOLN
20.0000 [IU] | Freq: Every day | SUBCUTANEOUS | Status: DC
  Filled 2015-09-12: qty 0.2

## 2015-09-12 MED ORDER — INSULIN DETEMIR 100 UNIT/ML ~~LOC~~ SOLN
15.0000 [IU] | Freq: Every day | SUBCUTANEOUS | Status: DC
  Filled 2015-09-12: qty 0.15

## 2015-09-12 MED ORDER — RENA-VITE PO TABS: 1.0000 | ORAL_TABLET | Freq: Every day | ORAL | Status: DC

## 2015-09-12 MED ORDER — HEPARIN SODIUM (PORCINE) 5000 UNIT/ML IJ SOLN
5000.0000 [IU] | Freq: Three times a day (TID) | INTRAMUSCULAR | Status: DC
  Filled 2015-09-12: qty 1

## 2015-09-12 MED ORDER — ACETAMINOPHEN 325 MG PO TABS: 650.0000 mg | ORAL_TABLET | Freq: Four times a day (QID) | ORAL | Status: DC | PRN

## 2015-09-12 MED ORDER — ACETAMINOPHEN 650 MG RE SUPP: 650.0000 mg | Freq: Four times a day (QID) | RECTAL | Status: DC | PRN

## 2015-09-12 MED ORDER — CALCIUM CARBONATE ANTACID 500 MG PO CHEW
4.0000 | CHEWABLE_TABLET | Freq: Three times a day (TID) | ORAL | Status: DC
  Administered 2015-09-12: 800 mg via ORAL
  Filled 2015-09-12: qty 4

## 2015-09-12 MED ORDER — VANCOMYCIN HCL IN DEXTROSE 1-5 GM/200ML-% IV SOLN: 1000.0000 mg | INTRAVENOUS | Status: DC

## 2015-09-12 MED ORDER — ASPIRIN EC 81 MG PO TBEC
81.0000 mg | DELAYED_RELEASE_TABLET | Freq: Every day | ORAL | Status: DC
  Filled 2015-09-12: qty 1

## 2015-09-12 MED ORDER — CALCITRIOL 0.5 MCG PO CAPS: 2.5000 ug | ORAL_CAPSULE | ORAL | Status: DC

## 2015-09-12 MED ORDER — ASPIRIN 81 MG PO CHEW
324.0000 mg | CHEWABLE_TABLET | Freq: Once | ORAL | Status: AC
Start: 2015-09-12 — End: 2015-09-12
  Administered 2015-09-12: 324 mg via ORAL
  Filled 2015-09-12: qty 4

## 2015-09-12 NOTE — Progress Notes (Signed)
Inpatient Diabetes Program Recommendations  AACE/ADA: New Consensus Statement on Inpatient Glycemic Control (2015)  Target Ranges:  Prepandial:   less than 140 mg/dL      Peak postprandial:   less than 180 mg/dL (1-2 hours)      Critically ill patients:  140 - 180 mg/dL   Results for [name], [name] (MRN [id number]) as of 09/12/2015 07:44  Ref. Range 09/12/2015 00:20  Glucose Latest Ref Range: 65-99 mg/dL 229 (H)   Review of Glycemic Control  Diabetes history: DM 2 Outpatient Diabetes medications: Levemir 20 units QHS Current orders for Inpatient glycemic control: Levemir 15 units QHS  Inpatient Diabetes Program Recommendations: Insulin - Basal: Levemir ordered for HS. Lab glucose  in 200's this am. Please change Levemir to Daily instead of HS. Correction (SSI): While inpatient, patient has a history of DM. Please order CBGs and Novolog Sensitive Correction AC + HS scale, if NPO Q4hrs.  Thanks,  Tama Headings RN, MSN, [facility] Inpatient Diabetes Coordinator Team Pager [phone] (8a-5p)

## 2015-09-12 NOTE — ED Provider Notes (Signed)
CSN: [id number]     Arrival date & time 09/12/15  0003 History  By signing my name below, I, Helane Gunther, attest that this documentation has been prepared under the direction and in the presence of Varney Biles, MD. Electronically Signed: Helane Gunther, ED Scribe. 09/12/2015. 3:15 AM.    Chief Complaint  Patient presents with  . Chest Pain   The history is provided by the patient and medical records. No language interpreter was used.   HPI Comments: JERRITT OBERG is a 50 y.o. male smoker at less than 1 ppd with a PMHx of HTN, dysrhythmia, GERD, chronic kidney disease, ESRD, and DM who presents to the Emergency Department complaining of intermittent, sharp, left-sided chest pain onset 2.5 days ago. Pt states it feels as though he "pulled a muscle," with each episode lasting for a few minutes at a time. He denies exacerbation of the pain with movement. He states this feels like his GERD. He has taken some antacids today with no relief. He reports his last dialysis session was today. He denies a PMHx of MI or CHF. He denies any substance abuse. He also denies a FHx of heart disease. He notes he is scheduled to see a cardiologist this week, due to just recently being discharged after being having an infection. Pt denies nausea, diaphoresis, and SOB.   Records indicate that pt is being treated for endocarditis, and it was discovered that he has CHF and that he had no chest pain, dib thus an ischemia r/o workup was going to be completed as an outpatient.   Past Medical History  Diagnosis Date  . Hypertension   . Chest pain   . Morbid obesity ([place])   . Bronchitis   . Blood transfusion   . H/O hiatal hernia   . Chronic kidney disease     dialysis, M-W-F, [facility]  . GERD (gastroesophageal reflux disease)   . Dysrhythmia     irregular heartbeat  . Hyperparathyroidism ([place])   . ESRD (end stage renal disease) on dialysis ([place])     first HD 06/19/04  . Pneumonia   . Diabetes mellitus      type 2  . Anemia    Past Surgical History  Procedure Laterality Date  . Av fistula placement  10/11/2010  . Ankle fusion      rt foot charcot joint 2008 with refusion  . Av fistula placement  07/02/2011    Procedure: INSERTION OF ARTERIOVENOUS (AV) GORE-TEX GRAFT ARM;  Surgeon: Elam Dutch, MD;  Location: [place];  Service: Vascular;  Laterality: Left;  . Thrombectomy    . Insertion of dialysis catheter  01/20/2012    Procedure: INSERTION OF DIALYSIS CATHETER;  Surgeon: Serafina Mitchell, MD;  Location: [place];  Service: Vascular;  Laterality: N/A;  insertion left internal jugular dialysis catheter 23cm  . Umbilcial hernia    . Parathyroidectomy    . Avgg removal Right 11/06/2012    Procedure: REMOVAL OF ARTERIOVENOUS GORETEX GRAFT ([place]);  Surgeon: Mal Misty, MD;  Location: [place];  Service: Vascular;  Laterality: Right;  . Av fistula placement Right 11/06/2012    Procedure: INSERTION OF ARTERIOVENOUS (AV) GORE-TEX GRAFT ARM;  Surgeon: Mal Misty, MD;  Location: [place];  Service: Vascular;  Laterality: Right;  . Insertion of dialysis catheter Right 11/09/2012    Procedure: INSERTION OF DIALYSIS CATHETER;  Surgeon: Conrad Indianapolis, MD;  Location: [place];  Service: Vascular;  Laterality: Right;  Right  Femoral Vein  . Venogram N/A 01/06/2014    Procedure: VENOGRAM;  Surgeon: Conrad [place]own, MD;  Location: [facility] CATH LAB;  Service: Cardiovascular;  Laterality: N/A;  . Amputation Left 01/20/2015    Procedure: Left Great Toe Amputation;  Surgeon: Newt Minion, MD;  Location: [place];  Service: Orthopedics;  Laterality: Left;  . Amputation Left 03/07/2015    Procedure: Left Foot 1st and 2nd Ray Amputation;  Surgeon: Newt Minion, MD;  Location: [place];  Service: Orthopedics;  Laterality: Left;  . Amputation Left 04/14/2015    Procedure: LEFT BELOW THE KNEE AMPUTATION ;  Surgeon: Newt Minion, MD;  Location: [place];  Service: Orthopedics;  Laterality: Left;  . Removal of a hero device Left 08/14/2015     Procedure: REMOVAL OF LEFT ARM HERO DEVICE;  Surgeon: Rosetta Posner, MD;  Location: [place];  Service: Vascular;  Laterality: Left;  . Insertion of dialysis catheter N/A 08/18/2015    Procedure: INSERTION OF DIALYSIS CATHETER RIGHT FEMORAL VEIN;  Surgeon: Mal Misty, MD;  Location: [place];  Service: Cardiovascular;  Laterality: N/A;  . Tee without cardioversion N/A 08/18/2015    Procedure: TRANSESOPHAGEAL ECHOCARDIOGRAM (TEE);  Surgeon: Mal Misty, MD;  Location: [facility] OR;  Service: Cardiovascular;  Laterality: N/A;   Family History  Problem Relation Age of Onset  . Heart disease Mother   . Kidney disease Mother   . Hypertension Mother    Social History  Substance Use Topics  . Smoking status: Former Smoker -- 0.10 packs/day for 20 years    Types: Cigarettes    Quit date: 01/13/2015  . Smokeless tobacco: Never Used  . Alcohol Use: No    Review of Systems  Constitutional: Negative for diaphoresis.  Respiratory: Negative for shortness of breath.   Cardiovascular: Positive for chest pain.  Gastrointestinal: Negative for nausea.     ROS 10 Systems reviewed and are negative for acute change except as noted in the HPI.     Allergies  Review of patient's allergies indicates no known allergies.  Home Medications   Prior to Admission medications   Medication Sig Start Date End Date Taking? Authorizing Provider  aspirin EC 81 MG EC tablet Take 1 tablet (81 mg total) by mouth daily. 08/19/15  Yes Alexa Sherral Hammers, MD  b complex-vitamin c-folic acid (NEPHRO-VITE) 0.8 MG TABS tablet Take 1 tablet by mouth at bedtime. 04/26/15  Yes Daniel J Angiulli, PA-C  calcitRIOL (ROCALTROL) 0.5 MCG capsule Take 5 capsules (2.5 mcg total) by mouth every Monday, Wednesday, and Friday with hemodialysis. 04/26/15  Yes Daniel J Angiulli, PA-C  calcium carbonate (TUMS - DOSED IN MG ELEMENTAL CALCIUM) 500 MG chewable tablet Chew 4 tablets (800 mg of elemental calcium total) by mouth 3 (three) times daily  with meals. 04/26/15  Yes Daniel J Angiulli, PA-C  famotidine (PEPCID) 20 MG tablet Take 1 tablet (20 mg total) by mouth at bedtime. 04/26/15  Yes Daniel J Angiulli, PA-C  insulin detemir (LEVEMIR) 100 UNIT/ML injection Inject 0.2 mLs (20 Units total) into the skin at bedtime. 04/26/15  Yes Daniel J Angiulli, PA-C  methocarbamol (ROBAXIN) 500 MG tablet Take 1 tablet (500 mg total) by mouth every 6 (six) hours as needed for muscle spasms. 04/26/15  Yes Daniel J Angiulli, PA-C  oxyCODONE (OXY IR/ROXICODONE) 5 MG immediate release tablet Take 1-2 tablets (5-10 mg total) by mouth every 4 (four) hours as needed for breakthrough pain. 08/19/15  Yes Alexa Sherral Hammers, MD  vancomycin (VANCOCIN) 1 GM/200ML SOLN Inject 200 mLs (1,000 mg total) into the vein every Monday, Wednesday, and Friday with hemodialysis. Stop Date: 09/27/15 as last dose 08/19/15  Yes Alexa Sherral Hammers, MD   BP 96/71 mmHg  Pulse 102  Temp(Src) 98.4 F (36.9 C) (Oral)  Resp 21  Ht 6\' 1"  (1.854 m)  Wt 260 lb (117.935 kg)  BMI 34.31 kg/m2  SpO2 97% Physical Exam  Constitutional: He is oriented to person, place, and time. He appears well-developed and well-nourished.  HENT:  Head: Normocephalic and atraumatic.  Eyes: Conjunctivae are normal. Right eye exhibits no discharge. Left eye exhibits no discharge.  Neck: No JVD present.  Cardiovascular: Regular rhythm.  Tachycardia present.   Pulmonary/Chest: Effort normal and breath sounds normal. No respiratory distress. He exhibits no tenderness.  Chest pain is not reproducible with palpation  Abdominal: Soft.  Neurological: He is alert and oriented to person, place, and time. Coordination normal.  Skin: Skin is warm and dry. No rash noted. He is not diaphoretic. No erythema.  Psychiatric: He has a normal mood and affect.  Nursing note and vitals reviewed.   ED Course  Procedures  DIAGNOSTIC STUDIES: Oxygen Saturation is 100% on RA, normal by my interpretation.    COORDINATION OF  CARE: 3:14 AM - Discussed normal EKG and plans to admit pt to the hospital due to risk factors and previous cardiac concerns of 35% ejection fraction and endocarditis during pt's recent admission. Pt advised of plan for treatment and pt agrees.  Labs Review Labs Reviewed  BASIC METABOLIC PANEL - Abnormal; Notable for the following:    Chloride 94 (*)    Glucose, Bld 229 (*)    BUN 40 (*)    Creatinine, Ser 9.39 (*)    GFR calc non Af Amer 6 (*)    GFR calc Af Amer 7 (*)    Anion gap 17 (*)    All other components within normal limits  CBC - Abnormal; Notable for the following:    RBC 3.09 (*)    Hemoglobin 9.3 (*)    HCT 30.3 (*)    RDW 18.1 (*)    All other components within normal limits  I-STAT TROPOININ, ED    Imaging Review Dg Chest 2 View  09/12/2015  CLINICAL DATA:  50 year old male with chest pain EXAM: CHEST  2 VIEW COMPARISON:  Chest radiograph dated 08/18/2015 FINDINGS: Two views of the chest do not demonstrate focal consolidation. There is no pleural effusion or pneumothorax. The cardiac silhouette is within normal limits. A lower approach central venous line is again noted in stable positioning and tip over the right atrium. Right subclavian vascular stent. No acute osseous pathology. IMPRESSION: No acute cardiopulmonary process. Electronically Signed   By: Anner Crete M.D.   On: 09/12/2015 00:31   I have personally reviewed and evaluated these images and lab results as part of my medical decision-making.   EKG Interpretation   Date/Time:  Tuesday September 12 2015 00:07:37 EST Ventricular Rate:  116 PR Interval:  172 QRS Duration: 80 QT Interval:  322 QTC Calculation: 447 R Axis:   66 Text Interpretation:  Sinus tachycardia anterior q waves  Abnormal ECG No  acute changes No significant change since last tracing Confirmed by  Kathrynn Humble, MD, Thelma Comp [phone]) on 09/12/2015 3:02:13 AM      MDM   Final diagnoses:  Chest pain of uncertain etiology  Acute chest  pain    I personally performed the services  described in this documentation, which was scribed in my presence. The recorded information has been reviewed and is accurate.  Pt comes in with cc of chest pain. Has several cardiac risk factors and has ESRD and amputation as complication of his poor vascular health. He is being treated for endocarditis and was noted to have CHF. He is having atypical chest pain on the L side currently, but given his risk factors, it is imperative to ensure this is not ischemic process.   Chest pain is intermittent, currently chest pain free. Trop is neg and ekg is reassuring. ASA given.  Varney Biles, MD 09/12/15 [phone]

## 2015-09-12 NOTE — Discharge Instructions (Signed)
Mr. Kegel, your chest pain was most likely due to a muscle strain. Your EKG and cardiac enzymes all came back negative so we do not believe it is related to your heart. It will be very important for you to follow up with Cardiology and Infectious Disease. You have an appointment with Dr. Terrence Dupont this Friday the 3rd and with Dr. Barnie Alderman on Monday the 6th.   Endocarditis Endocarditis is an infection of the inner layer of the heart (endocardium) or of the heart valves. Endocarditis can cause growths inside the heart or on the heart valves. These growths can destroy heart tissue and cause heart failure over time. They can also cause stroke if they break away and form a blood clot in the brain. CAUSES  Endocarditis is caused by germs that normally live in or on your body. The germs that most commonly cause endocarditis are bacteria, but fungi can also cause endocarditis. RISK FACTORS Risk factors include:  Having a heart defect.  Having artificial (prosthetic) heart valves.  Having an abnormal or damaged heart valve.  Having a history of endocarditis.  Having had a heart transplant. SIGNS AND SYMPTOMS Signs and symptoms may start suddenly, or they may start slowly and gradually get worse. Symptoms include:  Fever.  Chills.  Night sweats.  Muscle aches.  Fatigue.  Weakness.  Shortness of breath. Signs include:  An abnormal heart sound (murmur).  Retinal bleeding.  Bleeding under the nails of your fingers or toes.  Painless red spots on your palms.  Painful lumps in your fingertips or toes.  Swelling in your feet or ankles. DIAGNOSIS  To make a diagnosis, your health care provider may:  Perform a physical exam. During the exam he or she will listen to your heart to check for a murmur. He or she may also use a scope to check for bleeding in your retinas.  Order tests. They may include:  Blood tests to look for the germs that cause endocarditis.  An echocardiogram to  create an image of your heart. TREATMENT Early treatment offers the best chance for curing endocarditis and preventing complications. Treatment depends on the cause of the endocarditis. Treatment may include:  Antibiotic medicines. These may be given through an IV tube or taken orally.  Surgery to replace your heart valve. You may need surgery if:  The endocarditis does not respond to treatment.  You develop complications.  Your heart valve is severely damaged. HOME CARE INSTRUCTIONS  Take your antibiotic as directed by your health care provider. Finish the antibiotic even if you start to feel better.  Gradually resume your usual activities.  Let your health care provider know before you have any dental or surgical procedures. You may need to take antibiotics before the procedure.  Let all your health care providers, including your dentist, know that you have had endocarditis.  Do not get tattoos or body piercings.  Do not use IV drugs unless it is part of your medical treatment.  Practice good oral hygiene. This includes:  Brushing and flossing regularly.  Scheduling routine dental appointments. SEEK MEDICAL CARE IF:  You have a fever.  Your symptoms do not improve.  Your symptoms get worse.  Your symptoms come back. SEEK IMMEDIATE MEDICAL CARE IF:  You have trouble breathing.  You have chest pain.  You have symptoms of stroke. These include:  Sudden weakness.  Numbness.  Confusion.  Trouble talking.  A severe headache.   This information is not intended to replace advice  given to you by your health care provider. Make sure you discuss any questions you have with your health care provider.   Document Released: 07/01/2005 Document Revised: 07/22/2014 Document Reviewed: 02/15/2014 Elsevier Interactive Patient Education [employer].

## 2015-09-12 NOTE — Discharge Summary (Signed)
Name: [name] MRN: [id number] DOB: 05/17/1966 50 y.o. PCP: Delrae Rend, MD  Date of Admission: 09/12/2015  2:08 AM Date of Discharge: 09/12/2015 Attending Physician: Axel Filler, MD  Discharge Diagnosis: Active Problems:   Acute chest pain   Chest pain  Discharge Medications:   Medication List    TAKE these medications        aspirin 81 MG EC tablet  Take 1 tablet (81 mg total) by mouth daily.     b complex-vitamin c-folic acid 0.8 MG Tabs tablet  Take 1 tablet by mouth at bedtime.     calcitRIOL 0.5 MCG capsule  Commonly known as:  ROCALTROL  Take 5 capsules (2.5 mcg total) by mouth every Monday, Wednesday, and Friday with hemodialysis.     calcium carbonate 500 MG chewable tablet  Commonly known as:  TUMS - dosed in mg elemental calcium  Chew 4 tablets (800 mg of elemental calcium total) by mouth 3 (three) times daily with meals.     famotidine 20 MG tablet  Commonly known as:  PEPCID  Take 1 tablet (20 mg total) by mouth at bedtime.     insulin detemir 100 UNIT/ML injection  Commonly known as:  LEVEMIR  Inject 0.2 mLs (20 Units total) into the skin at bedtime.     methocarbamol 500 MG tablet  Commonly known as:  ROBAXIN  Take 1 tablet (500 mg total) by mouth every 6 (six) hours as needed for muscle spasms.     oxyCODONE 5 MG immediate release tablet  Commonly known as:  Oxy IR/ROXICODONE  Take 1-2 tablets (5-10 mg total) by mouth every 4 (four) hours as needed for breakthrough pain.     vancomycin 1 GM/200ML Soln  Commonly known as:  VANCOCIN  Inject 200 mLs (1,000 mg total) into the vein every Monday, Wednesday, and Friday with hemodialysis. Stop Date: 09/27/15 as last dose        Disposition and follow-up:   [name] was discharged from [facility] in Stable condition.  At the hospital follow up visit please address:  1.  Atypcial non-anginal chest pain: EKG reassuring with negative biomarkers. Likely  MSK in nature 2/2 wheelchair and crutches use.  2. 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 Last dose will be 3/15.  3.  Labs / imaging needed at time of follow-up: None  4.  Pending labs/ test needing follow-up: None  Follow-up Appointments: Follow-up Information    Follow up with Charolette Forward, MD On 09/15/2015.   Specialty:  Cardiology   Why:  @ 3:30 PM   Contact information:   [address] [place] [address] [phone]       Follow up with KERR,JEFFREY, MD. Schedule an appointment as soon as possible for a visit in 1 week.   Specialty:  Endocrinology   Contact information:   [address] [place] [address] [phone]       Follow up with Carlyle Basques, MD On 09/18/2015.   Specialty:  Infectious Diseases   Why:  @ 2:00 PM   Contact information:   [place] [address] [phone]       Discharge Instructions: Discharge Instructions    Diet - low sodium heart healthy    Complete by:  As directed      Increase activity slowly    Complete by:  As directed            Consultations:    Procedures Performed:  Dg Chest 2 View  09/12/2015  CLINICAL DATA:  51 year old male with chest pain EXAM: CHEST  2 VIEW COMPARISON:  Chest radiograph dated 08/18/2015 FINDINGS: Two views of the chest do not demonstrate focal consolidation. There is no pleural effusion or pneumothorax. The cardiac silhouette is within normal limits. A lower approach central venous line is again noted in stable positioning and tip over the right atrium. Right subclavian vascular stent. No acute osseous pathology. IMPRESSION: No acute cardiopulmonary process. Electronically Signed   By: Anner Crete M.D.   On: 09/12/2015 00:31   Dg Chest Port 1 View  08/18/2015  CLINICAL DATA:  Dialysis catheter EXAM: PORTABLE CHEST 1 VIEW COMPARISON:  08/12/2015 FINDINGS: Mild  cardiomegaly. Vascular congestion. Low volumes. Bibasilar atelectasis. Left jugular central venous catheter removed. Left jugular hero catheter removed. Dialysis catheter has been placed from the lower extremity and the tip is in the right atrium. IMPRESSION: Bibasilar atelectasis Vascular congestion Dialysis catheter from the lower extremity has been placed with the tip in the right atrium. Electronically Signed   By: Marybelle Killings M.D.   On: 08/18/2015 13:17   Dg Fluoro Guide Cv Line-no Report  08/18/2015  CLINICAL DATA:  FLOURO GUIDE CV LINE Fluoroscopy was utilized by the requesting physician.  No radiographic interpretation.   Admission HPI: Patient is a 50 yo M with a PMHx of systolic heart failure (EF 30-35%), endocarditis, dysrhythmia, ESRD on HD, DM2, PVD with hx of LBKA, GERD, and hyperparathyroidism presenting to the hospital with a chief complaint of chest pain. States the chest pain started 2 days ago. He does not remember what he was doing at the time. He describes the pain as intermittent, substernal in location, 5/10 intensity, radiating to the L axilla, feels like "gas" or "pulling muscle," and each episode lasts a few minutes. States initially when the pain started, it was worse when sitting up or changing position in bed. As per patient, the chest pain is not associated with diaphoresis, dyspnea, nausea, or vomiting. Reports having similar chest pain 6 mos ago after his LBKA which he thought was due to muscle strain from him using a wheelchair. He reports having a history of GERD and states he is not currently on any medications because he experiences symptoms only occasionally. States his chest pain is not associated with eating. Denies lifting any heavy objects. Denies having any fevers, chills, cough, or pleuritic chest pain. Denies any recent travel, history of blood clots, calf or leg pain. Denies any history of anxiety or panic attacks. Denies any history of drug use.   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 The graft was removed on 08/13/15 and post-op course complicated by hypotension which required the patient to be on pressors. TEE done 08/18/15 showing EF 30-35%, vegetation in the right atrium, and increased right atrial pressure. Blood cultures did now show any growth and patient was discharged with a 6 week course of Vancomycin to be completed with dialysis. Patient was scheduled to follow up with cardiology as outpatient for further medical management and workup but he missed the appointment due to a transportation problem.   Hospital Course by problem list:   Atypical non-anginal chest pain: Patient presented with complaints of dull aching chest pain around his sternum and radiating to his left and right  chest for 2 days, worsen when leaning forward. No change with exertion. Tenderness to palpation over chest wall was present. EKG showed sinus tachycardia, no Q waves, no ST changes or T-wave inversion. Cardiac biomarker were negative. CXR with no effusions or infiltrates. Likely MSK related to his wheelchair and crutches. Patient unable to tolerate BB or ACE-i due to hypotension with dialysis. Statin therapy unlikely to be useful given his ESRD and HF. Patient will follow up with Dr. Terrence Dupont as an outpatient.   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 TEE done 08/18/15 showing vegetation in the right atrium. Receiving Vancomycin MWF with dialysis for a 6 week course ending 3/15. No further fevers or chills. Will follow up with Dr. Terrence Dupont and Dr. Baxter Flattery as an outpatient. Expect he will need a TEE to ensure resolution of the right atrial vegetation.   Systolic heart failure: Echo done 08/18/15 showing LVEF 30-35%. Denies having any orthopnea or dyspnea. Lungs CTAB and no peripheral edema noted.  Appears euvolemic on exam.  ESRD on HD (MWF): No missed dialysis sessions. Patient was dialyzed the day prior to admission (2/27).  DM2: Levemir 15 u QHS during hospitalization. Resumed home dose medications at discharge.   Chronic pain: Patient reported having chronic pain from his LBKA stub site. Continued his home dose Oxycodone 5-10 mg q4 prn.  Discharge Vitals:   BP 106/54 mmHg  Pulse 90  Temp(Src) 98.5 F (36.9 C) (Oral)  Resp 15  Ht 6' (1.829 m)  Wt 238 lb 8.6 oz (108.2 kg)  BMI 32.34 kg/m2  SpO2 100%  Discharge Labs:  No results found for this or any previous visit (from the past 24 hour(s)).  Signed: Maryellen Pile, MD 09/13/2015, 9:11 PM

## 2015-09-12 NOTE — H&P (Signed)
Date: 09/12/2015               Patient Name:  [name] MRN: [id number]  DOB: Jul 04, 1966 Age / Sex: 50 y.o., male   PCP: Delrae Rend, MD         Medical Service: Internal Medicine Teaching Service         Attending Physician: Dr. Axel Filler, MD    First Contact: Dr. Charlynn Grimes  Pager: [phone]  Second Contact: Dr. Aurelio Brash  Pager: [phone]       After Hours (After 5p/  First Contact Pager: [phone]  weekends / holidays): Second Contact Pager: [phone]   Chief Complaint: chest pain  History of Present Illness: Patient is a 50 yo M with a PMHx of systolic heart failure (EF 30-35%), endocarditis, dysrhythmia,  ESRD on HD, DM2, PVD with hx of LBKA, GERD, and hyperparathyroidism presenting to the hospital with a chief complaint of chest pain. States the chest pain started 2 days ago. He does not remember what he was doing at the time. He describes the pain as intermittent, substernal in location, 5/10 intensity, radiating to the L axilla, feels like "gas" or "pulling muscle," and each episode lasts a few minutes. States initially when the pain started, it was worse when sitting up or changing position in bed. As per patient, the chest pain is not associated with diaphoresis, dyspnea, nausea, or vomiting. Reports having similar chest pain 6 mos ago after his LBKA which he thought was due to muscle strain from him using a wheelchair. He reports having a history of GERD and states he is not currently on any medications because he experiences symptoms only occasionally. States his chest pain is not associated with eating. Denies lifting any heavy objects. Denies having any fevers, chills, cough, or pleuritic chest pain. Denies any recent travel, history of blood clots, calf or leg pain. Denies any history of anxiety or panic attacks. Denies any history of drug use.   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 The graft was removed on 08/13/15 and post-op course complicated by hypotension which required the patient to be on pressors. TEE done 08/18/15 showing EF 30-35%, vegetation in the right atrium, and increased right atrial pressure. Blood cultures did now show any growth and patient was discharged with a 6 week course of Vancomycin to be completed with dialysis. Patient was scheduled to follow up with cardiology as outpatient for further medical management and workup but he missed the appointment due to a transportation problem.   Meds: Current Facility-Administered Medications  Medication Dose Route Frequency Provider Last Rate Last Dose  . acetaminophen (TYLENOL) tablet 650 mg  650 mg Oral Q6H PRN Ejiroghene Arlyce Dice, MD       Or  . acetaminophen (TYLENOL) suppository 650 mg  650 mg Rectal Q6H PRN Ejiroghene Arlyce Dice, MD      . aspirin EC tablet 81 mg  81 mg Oral Daily Ejiroghene Arlyce Dice, MD      . Derrill Memo ON 09/13/2015] calcitRIOL (ROCALTROL) capsule 2.5 mcg  2.5 mcg Oral Q M,W,F-HD Ejiroghene E Emokpae, MD      . calcium carbonate (TUMS - dosed in mg elemental calcium) chewable tablet 800 mg of elemental calcium  4 tablet Oral TID WC Ejiroghene E Emokpae, MD      . famotidine (PEPCID) tablet 20 mg  20 mg Oral QHS Ejiroghene Arlyce Dice, MD      .  heparin injection 5,000 Units  5,000 Units Subcutaneous 3 times per day Ejiroghene E Denton Brick, MD      . insulin detemir (LEVEMIR) injection 15 Units  15 Units Subcutaneous QHS Ejiroghene E Emokpae, MD      . multivitamin (RENA-VIT) tablet 1 tablet  1 tablet Oral QHS Ejiroghene E Emokpae, MD      . oxyCODONE (Oxy IR/ROXICODONE) immediate release tablet 5-10 mg  5-10 mg Oral Q4H PRN Ejiroghene Arlyce Dice, MD      . Derrill Memo ON 09/13/2015] vancomycin (VANCOCIN) IVPB 1000 mg/200 mL premix  1,000 mg Intravenous Q M,W,F-HD Ejiroghene Arlyce Dice, MD        Allergies: Allergies as of 09/12/2015  . (No Known Allergies)   Past Medical History  Diagnosis  Date  . Hypertension   . Chest pain   . Morbid obesity ([place])   . Bronchitis   . Blood transfusion   . H/O hiatal hernia   . Chronic kidney disease     dialysis, M-W-F, [facility]  . GERD (gastroesophageal reflux disease)   . Dysrhythmia     irregular heartbeat  . Hyperparathyroidism ([place])   . ESRD (end stage renal disease) on dialysis ([place])     first HD 06/19/04  . Pneumonia   . Diabetes mellitus     type 2  . Anemia    Past Surgical History  Procedure Laterality Date  . Av fistula placement  10/11/2010  . Ankle fusion      rt foot charcot joint 2008 with refusion  . Av fistula placement  07/02/2011    Procedure: INSERTION OF ARTERIOVENOUS (AV) GORE-TEX GRAFT ARM;  Surgeon: Elam Dutch, MD;  Location: [employer] [facility];  Service: Vascular;  Laterality: Left;  . Thrombectomy    . Insertion of dialysis catheter  01/20/2012    Procedure: INSERTION OF DIALYSIS CATHETER;  Surgeon: Serafina Mitchell, MD;  Location: [place];  Service: Vascular;  Laterality: N/A;  insertion left internal jugular dialysis catheter 23cm  . Umbilcial hernia    . Parathyroidectomy    . Avgg removal Right 11/06/2012    Procedure: REMOVAL OF ARTERIOVENOUS GORETEX GRAFT ([place]);  Surgeon: Mal Misty, MD;  Location: [place];  Service: Vascular;  Laterality: Right;  . Av fistula placement Right 11/06/2012    Procedure: INSERTION OF ARTERIOVENOUS (AV) GORE-TEX GRAFT ARM;  Surgeon: Mal Misty, MD;  Location: [place];  Service: Vascular;  Laterality: Right;  . Insertion of dialysis catheter Right 11/09/2012    Procedure: INSERTION OF DIALYSIS CATHETER;  Surgeon: Conrad Woodford, MD;  Location: [place];  Service: Vascular;  Laterality: Right;  Right  Femoral Vein  . Venogram N/A 01/06/2014    Procedure: VENOGRAM;  Surgeon: Conrad [place], MD;  Location: [facility] C[facility];  Service: Cardiovascular;  Laterality: N/A;  . Amputation Left 01/20/2015    Procedure: Left Great Toe Amputation;  Surgeon: Newt Minion, MD;  Location: [place];  Service:  Orthopedics;  Laterality: Left;  . Amputation Left 03/07/2015    Procedure: Left Foot 1st and 2nd Ray Amputation;  Surgeon: Newt Minion, MD;  Location: [place];  Service: Orthopedics;  Laterality: Left;  . Amputation Left 04/14/2015    Procedure: LEFT BELOW THE KNEE AMPUTATION ;  Surgeon: Newt Minion, MD;  Location: [place];  Service: Orthopedics;  Laterality: Left;  . Removal of a hero device Left 08/14/2015    Procedure: REMOVAL OF LEFT ARM HERO DEVICE;  Surgeon: Arvilla Meres  Early, MD;  Location: [facility];  Service: Vascular;  Laterality: Left;  . Insertion of dialysis catheter N/A 08/18/2015    Procedure: INSERTION OF DIALYSIS CATHETER RIGHT FEMORAL VEIN;  Surgeon: Mal Misty, MD;  Location: [place];  Service: Cardiovascular;  Laterality: N/A;  . Tee without cardioversion N/A 08/18/2015    Procedure: TRANSESOPHAGEAL ECHOCARDIOGRAM (TEE);  Surgeon: Mal Misty, MD;  Location: [facility] OR;  Service: Cardiovascular;  Laterality: N/A;   Family History  Problem Relation Age of Onset  . Heart disease Mother   . Kidney disease Mother   . Hypertension Mother    Social History   Social History  . Marital Status: Single    Spouse Name: N/A  . Number of Children: N/A  . Years of Education: N/A   Occupational History  . Not on file.   Social History Main Topics  . Smoking status: Current Every Day Smoker -- 0.10 packs/day for 20 years    Types: Cigarettes    Last Attempt to Quit: 01/13/2015  . Smokeless tobacco: Never Used  . Alcohol Use: No  . Drug Use: No  . Sexual Activity: Not on file   Other Topics Concern  . Not on file   Social History Narrative    Review of Systems: Review of Systems  Constitutional: Negative for fever, chills and malaise/fatigue.  HENT: Negative for congestion and sore throat.   Eyes: Negative for blurred vision and pain.  Respiratory: Negative for cough, sputum production, shortness of breath and wheezing.   Cardiovascular: Positive for chest pain. Negative for  palpitations, orthopnea, leg swelling and PND.  Gastrointestinal: Negative for nausea, vomiting and abdominal pain.  Genitourinary: Negative for flank pain.  Musculoskeletal: Negative for myalgias and joint pain.  Skin: Negative for itching.       Chronic skin rash (from dialysis as per patient)   Neurological: Negative for dizziness, sensory change, focal weakness and headaches.    Physical Exam: Blood pressure 102/58, pulse 93, temperature 98.5 F (36.9 C), temperature source Oral, resp. rate 16, height 6' (1.829 m), weight 108.2 kg (238 lb 8.6 oz), SpO2 93 %. Physical Exam  Constitutional: He is oriented to person, place, and time. He appears well-developed and well-nourished. No distress.  African american male lying comfortably in hospital stretcher. L BKA noted.   HENT:  Head: Normocephalic and atraumatic.  Mouth/Throat: Oropharynx is clear and moist.  White coating on tongue   Eyes: EOM are normal. Pupils are equal, round, and reactive to light.  Neck: Neck supple. No tracheal deviation present.  Cardiovascular: Normal rate, regular rhythm and intact distal pulses.  Exam reveals no gallop and no friction rub.   No murmur heard. Pulmonary/Chest: Effort normal and breath sounds normal. No respiratory distress. He has no wheezes. He has no rales.  Abdominal: Soft. Bowel sounds are normal. He exhibits no distension. There is no tenderness.  Musculoskeletal: He exhibits no edema.  No tenderness on palpation of the chest area.  Right femoral dialysis access site covered with bandage which appears c/d/i.   Neurological: He is alert and oriented to person, place, and time.  Skin: Skin is warm and dry.  Right foot: Onycholysis of first toe. No purulent drainage noted.   Psychiatric: He has a normal mood and affect.    Lab results: Basic Metabolic Panel:  Recent Labs  09/12/15 0020  NA 137  K 4.6  CL 94*  CO2 26  GLUCOSE 229*  BUN 40*  CREATININE 9.39*  CALCIUM 9.1    CBC:  Recent Labs  09/12/15 0020  WBC 7.7  HGB 9.3*  HCT 30.3*  MCV 98.1  PLT 240   Imaging results:  Dg Chest 2 View  09/12/2015  CLINICAL DATA:  50 year old male with chest pain EXAM: CHEST  2 VIEW COMPARISON:  Chest radiograph dated 08/18/2015 FINDINGS: Two views of the chest do not demonstrate focal consolidation. There is no pleural effusion or pneumothorax. The cardiac silhouette is within normal limits. A lower approach central venous line is again noted in stable positioning and tip over the right atrium. Right subclavian vascular stent. No acute osseous pathology. IMPRESSION: No acute cardiopulmonary process. Electronically Signed   By: Anner Crete M.D.   On: 09/12/2015 00:31    Other results: EKG: Sinus tachycardia, anterior q waves. Similar to prior tracing. No acute ST/ T wave changes noted.  Assessment & Plan by Problem: Active Problems:   Acute chest pain   Chest pain  Acute chest pain Likely musculoskeletal in nature. Patient does report worsening of the pain with positional changes. Reports having similar pain a few months ago from having to use his wheelchair. However, he does have risk factors for CAD including DM2, HTN, smoking, and obesity. Cardiac cath done in 2004 showing no evidence of CAD. 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 EKG on admission did not show any acute ST/ T wave changes. POC Troponin <0.01. CXR did not show any acute abnormality. He was given a dose of Aspirin in the ED. Patient was tachycardic on admission (HR 113) but other vitals were stable. Well's score +1.5 making PE less likely.  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 TEE done 08/18/15 showing vegetation in the right atrium.  -Continue Vancomycin 1000 mg  IV on MWF with dialysis for a total of 6 weeks (last dose 3/15) -Needs to follow-up with ID as outpatient   Systolic heart failure  Echo done 08/18/15 showing LVEF 30-35%. Denies having any orthopnea or dyspnea. Lungs CTAB and no peripheral edema noted.  -Will need to follow up with cardiology as outpatient   ESRD on HD (MWF) Patient was dialyzed yesterday (2/27). -Next dialysis on Wednesday  -Rena-vit multivitamin daily   DM2 -Levemir 15 u QHS  Chronic pain Patient reports having chronic pain from his LBKA stub site.  -Oxycodone 5-10 mg q4 prn   GERD -Pepcid 20 mg daily   -TUMS 3 times daily   DVT/ PE ppx: Sub Q Heparin   Diet: renal   Code: Full    Dispo: Disposition is deferred at this time, awaiting improvement of current medical problems. Anticipated discharge in approximately 1-2 day(s).   The patient does have a current PCP Delrae Rend, MD) and does need an [facility] hospital follow-up appointment after discharge.  The patient does not have transportation limitations that hinder transportation to clinic appointments.  Signed: Shela Leff, MD 09/12/2015, 5:58 AM

## 2015-09-12 NOTE — ED Notes (Signed)
Patient here with complaint of left chest pain. States that the pain started 2 days ago. Unsure of what he was doing at time of onset, but states it initially felt like "gas pain". ESRD present; MWF schedule; denies missing appointments; last dialyzed today. Denies other anginal equivalents. States some movements increase pain.

## 2015-09-12 NOTE — Progress Notes (Signed)
[facility] was asked to see this patient in consult for chest pain. The pt told me he was going to be established with his mother's cardiologist- Dr Terrence Dupont- who is not in [facility]. I have relayed this to the teaching service. I have cancelled the pt's OV with Ellen Henri on 09/19/15 that was set up on an earlier admission.   Kerin Ransom PA-C 09/12/2015 8:40 AM

## 2015-09-12 NOTE — Care Management Obs Status (Signed)
[place] NOTIFICATION   Patient Details  Name: [name] MRN: [id number] Date of Birth: [birth year]   Medicare Observation Status Notification Given:  Yes (Chest pain)    Bethena Roys, RN 09/12/2015, 3:56 PM

## 2015-09-12 NOTE — Progress Notes (Signed)
Subjective: Patient denies any further chest pain. Reports his pain is positional and only occurs when leaning forward. Reports that his pain occurred after using his wheelchair and was in the insertion points for his pectoralis muscle. Denies any shortness of breath, diaphoresis, fevers, nausea/vomiting.  Objective: Vital signs in last 24 hours: Filed Vitals:   09/12/15 0315 09/12/15 0348 09/12/15 0400 09/12/15 0512  BP: 114/67 96/71 104/93 102/58  Pulse: 107 102 97 93  Temp:    98.5 F (36.9 C)  TempSrc:    Oral  Resp: 16 21 13 16   Height:    6' (1.829 m)  Weight:    238 lb 8.6 oz (108.2 kg)  SpO2: 98% 97% 100% 93%   Weight change:  No intake or output data in the 24 hours ending 09/12/15 1122   GENERAL- alert, co-operative, appears as stated age, not in any distress. CARDIAC- RRR, no murmurs, rubs or gallops. RESP- Moving equal volumes of air, and clear to auscultation bilaterally, no wheezes or crackles. ABDOMEN- Soft, nontender, bowel sounds present. EXTREMITIES- pulse 2+, no pedal edema L BKA. SKIN- Warm, dry, No rash or lesion. PSYCH- Normal mood and affect, appropriate thought content and speech.  Medications: I have reviewed the patient's current medications. Scheduled Meds: . aspirin EC  81 mg Oral Daily  . [START ON 09/13/2015] calcitRIOL  2.5 mcg Oral Q M,W,F-HD  . calcium carbonate  4 tablet Oral TID WC  . famotidine  20 mg Oral QHS  . heparin  5,000 Units Subcutaneous 3 times per day  . insulin detemir  15 Units Subcutaneous QHS  . multivitamin  1 tablet Oral QHS  . [START ON 09/13/2015] vancomycin  1,000 mg Intravenous Q M,W,F-HD   Continuous Infusions:  PRN Meds:.acetaminophen **OR** acetaminophen, oxyCODONE Assessment/Plan:  Atypical chest pain: Likely musculoskeletal in nature. EKG with no acute ischemic changes. Troponin negative x2. Last troponin in process. Vitals stable overnight. Chest pain has resolved. Will need follow up with Cardiology, request  Dr. Terrence Dupont.  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 TEE done 08/18/15 showing vegetation in the right atrium.  -Continue Vancomycin 1000 mg IV on MWF with dialysis for a total of 6 weeks (last dose 3/15) -Needs to follow-up with ID as outpatient   Systolic heart failure: Echo done 08/18/15 showing LVEF 30-35%. Denies having any orthopnea or dyspnea. Lungs CTAB and no peripheral edema noted.  -Will need to follow up with cardiology as outpatient   ESRD on HD (MWF): Patient was dialyzed (2/27). -Next dialysis tomorrow -Rena-vit multivitamin daily   DM2: CBG 261 this morning. Anticipate discharge today but will increase Levemir to 20 units qhs if still inpatient tonight.  -Increase Levemir 20 u QHS  Chronic pain Patient reports having chronic pain from his LBKA stub site.  -Oxycodone 5-10 mg q4 prn   GERD -Pepcid 20 mg daily  -TUMS 3 times daily   DVT/ PE ppx: Sub Q Heparin   Dispo: Anticipate discharge today  The patient does have a current PCP Delrae Rend, MD) and does need an [facility] hospital follow-up appointment after discharge.  The patient does not have transportation limitations that hinder transportation to clinic appointments.  .Services Needed at time of discharge: Y = Yes, Blank = No PT:   OT:   RN:   Equipment:   Other:  Maryellen Pile, MD IMTS PGY-1 [phone] 09/12/2015, 11:22 AM

## 2015-09-13 DIAGNOSIS — N186 End stage renal disease: Secondary | ICD-10-CM | POA: Diagnosis not present

## 2015-09-13 DIAGNOSIS — E209 Hypoparathyroidism, unspecified: Secondary | ICD-10-CM | POA: Diagnosis not present

## 2015-09-13 DIAGNOSIS — D509 Iron deficiency anemia, unspecified: Secondary | ICD-10-CM | POA: Diagnosis not present

## 2015-09-13 DIAGNOSIS — E119 Type 2 diabetes mellitus without complications: Secondary | ICD-10-CM | POA: Diagnosis not present

## 2015-09-13 DIAGNOSIS — D508 Other iron deficiency anemias: Secondary | ICD-10-CM | POA: Diagnosis not present

## 2015-09-13 DIAGNOSIS — D631 Anemia in chronic kidney disease: Secondary | ICD-10-CM | POA: Diagnosis not present

## 2015-09-13 DIAGNOSIS — D689 Coagulation defect, unspecified: Secondary | ICD-10-CM | POA: Diagnosis not present

## 2015-09-13 DIAGNOSIS — A4102 Sepsis due to Methicillin resistant Staphylococcus aureus: Secondary | ICD-10-CM | POA: Diagnosis not present

## 2015-09-15 DIAGNOSIS — D509 Iron deficiency anemia, unspecified: Secondary | ICD-10-CM | POA: Diagnosis not present

## 2015-09-15 DIAGNOSIS — E209 Hypoparathyroidism, unspecified: Secondary | ICD-10-CM | POA: Diagnosis not present

## 2015-09-15 DIAGNOSIS — I1311 Hypertensive heart and chronic kidney disease without heart failure, with stage 5 chronic kidney disease, or end stage renal disease: Secondary | ICD-10-CM | POA: Diagnosis not present

## 2015-09-15 DIAGNOSIS — D508 Other iron deficiency anemias: Secondary | ICD-10-CM | POA: Diagnosis not present

## 2015-09-15 DIAGNOSIS — E785 Hyperlipidemia, unspecified: Secondary | ICD-10-CM | POA: Diagnosis not present

## 2015-09-15 DIAGNOSIS — M1990 Unspecified osteoarthritis, unspecified site: Secondary | ICD-10-CM | POA: Diagnosis not present

## 2015-09-15 DIAGNOSIS — N186 End stage renal disease: Secondary | ICD-10-CM | POA: Diagnosis not present

## 2015-09-15 DIAGNOSIS — D689 Coagulation defect, unspecified: Secondary | ICD-10-CM | POA: Diagnosis not present

## 2015-09-15 DIAGNOSIS — R0789 Other chest pain: Secondary | ICD-10-CM | POA: Diagnosis not present

## 2015-09-15 DIAGNOSIS — D649 Anemia, unspecified: Secondary | ICD-10-CM | POA: Diagnosis not present

## 2015-09-15 DIAGNOSIS — A4102 Sepsis due to Methicillin resistant Staphylococcus aureus: Secondary | ICD-10-CM | POA: Diagnosis not present

## 2015-09-15 DIAGNOSIS — Z992 Dependence on renal dialysis: Secondary | ICD-10-CM | POA: Diagnosis not present

## 2015-09-15 DIAGNOSIS — B9562 Methicillin resistant Staphylococcus aureus infection as the cause of diseases classified elsewhere: Secondary | ICD-10-CM | POA: Diagnosis not present

## 2015-09-18 ENCOUNTER — Ambulatory Visit (INDEPENDENT_AMBULATORY_CARE_PROVIDER_SITE_OTHER): Payer: Medicare Other | Admitting: Internal Medicine

## 2015-09-18 ENCOUNTER — Encounter: Payer: Self-pay | Admitting: Internal Medicine

## 2015-09-18 VITALS — BP 108/67 | HR 105 | Temp 98.4°F

## 2015-09-18 DIAGNOSIS — N186 End stage renal disease: Secondary | ICD-10-CM | POA: Diagnosis not present

## 2015-09-18 DIAGNOSIS — R7881 Bacteremia: Secondary | ICD-10-CM

## 2015-09-18 DIAGNOSIS — I339 Acute and subacute endocarditis, unspecified: Secondary | ICD-10-CM | POA: Diagnosis not present

## 2015-09-18 DIAGNOSIS — D689 Coagulation defect, unspecified: Secondary | ICD-10-CM | POA: Diagnosis not present

## 2015-09-18 DIAGNOSIS — D509 Iron deficiency anemia, unspecified: Secondary | ICD-10-CM | POA: Diagnosis not present

## 2015-09-18 DIAGNOSIS — B9562 Methicillin resistant Staphylococcus aureus infection as the cause of diseases classified elsewhere: Secondary | ICD-10-CM | POA: Diagnosis not present

## 2015-09-18 DIAGNOSIS — E209 Hypoparathyroidism, unspecified: Secondary | ICD-10-CM | POA: Diagnosis not present

## 2015-09-18 DIAGNOSIS — A4102 Sepsis due to Methicillin resistant Staphylococcus aureus: Secondary | ICD-10-CM | POA: Diagnosis not present

## 2015-09-18 DIAGNOSIS — D508 Other iron deficiency anemias: Secondary | ICD-10-CM | POA: Diagnosis not present

## 2015-09-18 NOTE — Progress Notes (Signed)
RFV: follow up for MRSA bacteremia c/b endocarditis  Subjective:    Patient ID: [name], male    DOB: 1966/03/11, 50 y.o.   MRN: [id number]  HPI 50yo M with ESRD on HD with HeRO graft which thrombosed on 1/25, where he was admitted with fever and chills, he was found to have complicated MRSA bacteremia. Work up revealed on TEE that he had an atrial vegetation. HeRO graft removal on 1/30. He was discharged on vancomycin with plan to treat for 6 wk, using 2/1 as day 1 of 42 with vancomycin, administer after each HD session - end date is March 14th, 2017. His upper arm is nearly healed where he places daily dressing changes. He currently gets dialyzed through femoral graft.  He denies any fever, chills, nightsweats. No pain with his hemodialysis site. His arm is healing well.   No Known Allergies Current Outpatient Prescriptions on File Prior to Visit  Medication Sig Dispense Refill  . aspirin EC 81 MG EC tablet Take 1 tablet (81 mg total) by mouth daily. 30 tablet 11  . b complex-vitamin c-folic acid (NEPHRO-VITE) 0.8 MG TABS tablet Take 1 tablet by mouth at bedtime. 30 tablet 0  . calcitRIOL (ROCALTROL) 0.5 MCG capsule Take 5 capsules (2.5 mcg total) by mouth every Monday, Wednesday, and Friday with hemodialysis. 30 capsule 0  . calcium carbonate (TUMS - DOSED IN MG ELEMENTAL CALCIUM) 500 MG chewable tablet Chew 4 tablets (800 mg of elemental calcium total) by mouth 3 (three) times daily with meals.    . famotidine (PEPCID) 20 MG tablet Take 1 tablet (20 mg total) by mouth at bedtime. 30 tablet 0  . insulin detemir (LEVEMIR) 100 UNIT/ML injection Inject 0.2 mLs (20 Units total) into the skin at bedtime. 10 mL 11  . methocarbamol (ROBAXIN) 500 MG tablet Take 1 tablet (500 mg total) by mouth every 6 (six) hours as needed for muscle spasms. 60 tablet 0  . oxyCODONE (OXY IR/ROXICODONE) 5 MG immediate release tablet Take 1-2 tablets (5-10 mg total) by mouth every 4 (four) hours as needed for  breakthrough pain. 20 tablet 0  . vancomycin (VANCOCIN) 1 GM/200ML SOLN Inject 200 mLs (1,000 mg total) into the vein every Monday, Wednesday, and Friday with hemodialysis. Stop Date: 09/27/15 as last dose 4000 mL 0   No current facility-administered medications on file prior to visit.   Active Ambulatory Problems    Diagnosis Date Noted  . DIASTOLIC DYSFUNCTION 0000000  . PVD 10/22/2007  . ESRD 10/22/2007  . HYPERPARATHYROIDISM, HX OF 10/22/2007  . ESRD on dialysis ([place]) 10/23/2012  . 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 S/P BKA (below knee amputation) unilateral ([place]) 04/14/2015  . Status post below knee amputation of left lower extremity ([place]) 04/17/2015  . Diabetes mellitus type 2, uncontrolled ([place]) 04/18/2015  . Infection and inflammatory reaction due to cardiac device, implant, and graft ([place])   . Septic shock ([place])   . MRSA bacteremia   . Esophageal reflux   . Acute chest pain 09/12/2015  . Chest pain 09/12/2015   Resolved Ambulatory Problems    Diagnosis Date Noted  . Fever 08/11/2015  . Sepsis ([place]) 08/11/2015  . Severe sepsis ([place])   . Staphylococcus sepsis [facility])    Past Medical History  Diagnosis Date  . Hypertension   . Chest pain   . Morbid obesity ([place])   . Bronchitis   . Blood transfusion   . H/O hiatal hernia   .  Chronic kidney disease   . GERD (gastroesophageal reflux disease)   . Dysrhythmia   . Hyperparathyroidism ([place])   . ESRD (end stage renal disease) on dialysis ([place])   . Pneumonia   . Diabetes mellitus   . Anemia    Social History  Substance Use Topics  . Smoking status: Current Every Day Smoker -- 0.10 packs/day for 20 years    Types: Cigarettes    Last Attempt to Quit: 01/13/2015  . Smokeless tobacco: Never Used  . Alcohol Use: No  family history includes Heart disease in his mother; Hypertension in his mother; Kidney disease in his mother.  Review of Systems 10 point ros is  negative. No fever, chills, nightsweats    Objective:   Physical Exam BP 108/67 mmHg  Pulse 105  Temp(Src) 98.4 F (36.9 C) (Oral) gen = a xo by 3 in nad Cors= nl s1,s2 no g/m/r Ext = left upper arm wound healing well. 4cm long 0.5cm wide, shallow pink wound bed, good granulation tissue     Assessment & Plan:  mrsa bacteremia complicated by endocarditis = continue with vancomycin post hemodialysis thru march 14th. We will repeat surveillance blood culture march 20th.to ensure no further evidence of bacteremia

## 2015-09-19 ENCOUNTER — Encounter: Payer: Medicare Other | Admitting: Cardiology

## 2015-09-20 DIAGNOSIS — D689 Coagulation defect, unspecified: Secondary | ICD-10-CM | POA: Diagnosis not present

## 2015-09-20 DIAGNOSIS — D509 Iron deficiency anemia, unspecified: Secondary | ICD-10-CM | POA: Diagnosis not present

## 2015-09-20 DIAGNOSIS — N186 End stage renal disease: Secondary | ICD-10-CM | POA: Diagnosis not present

## 2015-09-20 DIAGNOSIS — E209 Hypoparathyroidism, unspecified: Secondary | ICD-10-CM | POA: Diagnosis not present

## 2015-09-20 DIAGNOSIS — D508 Other iron deficiency anemias: Secondary | ICD-10-CM | POA: Diagnosis not present

## 2015-09-20 DIAGNOSIS — A4102 Sepsis due to Methicillin resistant Staphylococcus aureus: Secondary | ICD-10-CM | POA: Diagnosis not present

## 2015-09-21 DIAGNOSIS — Z794 Long term (current) use of insulin: Secondary | ICD-10-CM | POA: Diagnosis not present

## 2015-09-21 DIAGNOSIS — E1121 Type 2 diabetes mellitus with diabetic nephropathy: Secondary | ICD-10-CM | POA: Diagnosis not present

## 2015-09-21 DIAGNOSIS — Z23 Encounter for immunization: Secondary | ICD-10-CM | POA: Diagnosis not present

## 2015-09-21 DIAGNOSIS — Z89512 Acquired absence of left leg below knee: Secondary | ICD-10-CM | POA: Diagnosis not present

## 2015-09-22 DIAGNOSIS — A4102 Sepsis due to Methicillin resistant Staphylococcus aureus: Secondary | ICD-10-CM | POA: Diagnosis not present

## 2015-09-22 DIAGNOSIS — E209 Hypoparathyroidism, unspecified: Secondary | ICD-10-CM | POA: Diagnosis not present

## 2015-09-22 DIAGNOSIS — D509 Iron deficiency anemia, unspecified: Secondary | ICD-10-CM | POA: Diagnosis not present

## 2015-09-22 DIAGNOSIS — D689 Coagulation defect, unspecified: Secondary | ICD-10-CM | POA: Diagnosis not present

## 2015-09-22 DIAGNOSIS — N186 End stage renal disease: Secondary | ICD-10-CM | POA: Diagnosis not present

## 2015-09-22 DIAGNOSIS — D508 Other iron deficiency anemias: Secondary | ICD-10-CM | POA: Diagnosis not present

## 2015-09-25 DIAGNOSIS — A4102 Sepsis due to Methicillin resistant Staphylococcus aureus: Secondary | ICD-10-CM | POA: Diagnosis not present

## 2015-09-25 DIAGNOSIS — N186 End stage renal disease: Secondary | ICD-10-CM | POA: Diagnosis not present

## 2015-09-25 DIAGNOSIS — E209 Hypoparathyroidism, unspecified: Secondary | ICD-10-CM | POA: Diagnosis not present

## 2015-09-25 DIAGNOSIS — D689 Coagulation defect, unspecified: Secondary | ICD-10-CM | POA: Diagnosis not present

## 2015-09-25 DIAGNOSIS — D509 Iron deficiency anemia, unspecified: Secondary | ICD-10-CM | POA: Diagnosis not present

## 2015-09-25 DIAGNOSIS — D508 Other iron deficiency anemias: Secondary | ICD-10-CM | POA: Diagnosis not present

## 2015-09-27 DIAGNOSIS — D509 Iron deficiency anemia, unspecified: Secondary | ICD-10-CM | POA: Diagnosis not present

## 2015-09-27 DIAGNOSIS — D508 Other iron deficiency anemias: Secondary | ICD-10-CM | POA: Diagnosis not present

## 2015-09-27 DIAGNOSIS — A4102 Sepsis due to Methicillin resistant Staphylococcus aureus: Secondary | ICD-10-CM | POA: Diagnosis not present

## 2015-09-27 DIAGNOSIS — D689 Coagulation defect, unspecified: Secondary | ICD-10-CM | POA: Diagnosis not present

## 2015-09-27 DIAGNOSIS — E209 Hypoparathyroidism, unspecified: Secondary | ICD-10-CM | POA: Diagnosis not present

## 2015-09-27 DIAGNOSIS — N186 End stage renal disease: Secondary | ICD-10-CM | POA: Diagnosis not present

## 2015-09-29 DIAGNOSIS — D689 Coagulation defect, unspecified: Secondary | ICD-10-CM | POA: Diagnosis not present

## 2015-09-29 DIAGNOSIS — D508 Other iron deficiency anemias: Secondary | ICD-10-CM | POA: Diagnosis not present

## 2015-09-29 DIAGNOSIS — D509 Iron deficiency anemia, unspecified: Secondary | ICD-10-CM | POA: Diagnosis not present

## 2015-09-29 DIAGNOSIS — A4102 Sepsis due to Methicillin resistant Staphylococcus aureus: Secondary | ICD-10-CM | POA: Diagnosis not present

## 2015-09-29 DIAGNOSIS — E209 Hypoparathyroidism, unspecified: Secondary | ICD-10-CM | POA: Diagnosis not present

## 2015-09-29 DIAGNOSIS — N186 End stage renal disease: Secondary | ICD-10-CM | POA: Diagnosis not present

## 2015-10-02 DIAGNOSIS — N186 End stage renal disease: Secondary | ICD-10-CM | POA: Diagnosis not present

## 2015-10-02 DIAGNOSIS — E209 Hypoparathyroidism, unspecified: Secondary | ICD-10-CM | POA: Diagnosis not present

## 2015-10-02 DIAGNOSIS — D508 Other iron deficiency anemias: Secondary | ICD-10-CM | POA: Diagnosis not present

## 2015-10-02 DIAGNOSIS — D689 Coagulation defect, unspecified: Secondary | ICD-10-CM | POA: Diagnosis not present

## 2015-10-02 DIAGNOSIS — D509 Iron deficiency anemia, unspecified: Secondary | ICD-10-CM | POA: Diagnosis not present

## 2015-10-02 DIAGNOSIS — A4102 Sepsis due to Methicillin resistant Staphylococcus aureus: Secondary | ICD-10-CM | POA: Diagnosis not present

## 2015-10-04 DIAGNOSIS — N186 End stage renal disease: Secondary | ICD-10-CM | POA: Diagnosis not present

## 2015-10-04 DIAGNOSIS — D689 Coagulation defect, unspecified: Secondary | ICD-10-CM | POA: Diagnosis not present

## 2015-10-04 DIAGNOSIS — A4102 Sepsis due to Methicillin resistant Staphylococcus aureus: Secondary | ICD-10-CM | POA: Diagnosis not present

## 2015-10-04 DIAGNOSIS — D509 Iron deficiency anemia, unspecified: Secondary | ICD-10-CM | POA: Diagnosis not present

## 2015-10-04 DIAGNOSIS — E209 Hypoparathyroidism, unspecified: Secondary | ICD-10-CM | POA: Diagnosis not present

## 2015-10-04 DIAGNOSIS — D508 Other iron deficiency anemias: Secondary | ICD-10-CM | POA: Diagnosis not present

## 2015-10-06 DIAGNOSIS — E209 Hypoparathyroidism, unspecified: Secondary | ICD-10-CM | POA: Diagnosis not present

## 2015-10-06 DIAGNOSIS — D509 Iron deficiency anemia, unspecified: Secondary | ICD-10-CM | POA: Diagnosis not present

## 2015-10-06 DIAGNOSIS — D508 Other iron deficiency anemias: Secondary | ICD-10-CM | POA: Diagnosis not present

## 2015-10-06 DIAGNOSIS — A4102 Sepsis due to Methicillin resistant Staphylococcus aureus: Secondary | ICD-10-CM | POA: Diagnosis not present

## 2015-10-06 DIAGNOSIS — D689 Coagulation defect, unspecified: Secondary | ICD-10-CM | POA: Diagnosis not present

## 2015-10-06 DIAGNOSIS — N186 End stage renal disease: Secondary | ICD-10-CM | POA: Diagnosis not present

## 2015-10-09 DIAGNOSIS — D689 Coagulation defect, unspecified: Secondary | ICD-10-CM | POA: Diagnosis not present

## 2015-10-09 DIAGNOSIS — A4102 Sepsis due to Methicillin resistant Staphylococcus aureus: Secondary | ICD-10-CM | POA: Diagnosis not present

## 2015-10-09 DIAGNOSIS — D509 Iron deficiency anemia, unspecified: Secondary | ICD-10-CM | POA: Diagnosis not present

## 2015-10-09 DIAGNOSIS — N186 End stage renal disease: Secondary | ICD-10-CM | POA: Diagnosis not present

## 2015-10-09 DIAGNOSIS — E209 Hypoparathyroidism, unspecified: Secondary | ICD-10-CM | POA: Diagnosis not present

## 2015-10-09 DIAGNOSIS — D508 Other iron deficiency anemias: Secondary | ICD-10-CM | POA: Diagnosis not present

## 2015-10-11 DIAGNOSIS — E209 Hypoparathyroidism, unspecified: Secondary | ICD-10-CM | POA: Diagnosis not present

## 2015-10-11 DIAGNOSIS — N186 End stage renal disease: Secondary | ICD-10-CM | POA: Diagnosis not present

## 2015-10-11 DIAGNOSIS — D689 Coagulation defect, unspecified: Secondary | ICD-10-CM | POA: Diagnosis not present

## 2015-10-11 DIAGNOSIS — A4102 Sepsis due to Methicillin resistant Staphylococcus aureus: Secondary | ICD-10-CM | POA: Diagnosis not present

## 2015-10-11 DIAGNOSIS — D509 Iron deficiency anemia, unspecified: Secondary | ICD-10-CM | POA: Diagnosis not present

## 2015-10-11 DIAGNOSIS — D508 Other iron deficiency anemias: Secondary | ICD-10-CM | POA: Diagnosis not present

## 2015-10-13 DIAGNOSIS — Z992 Dependence on renal dialysis: Secondary | ICD-10-CM | POA: Diagnosis not present

## 2015-10-13 DIAGNOSIS — D509 Iron deficiency anemia, unspecified: Secondary | ICD-10-CM | POA: Diagnosis not present

## 2015-10-13 DIAGNOSIS — N186 End stage renal disease: Secondary | ICD-10-CM | POA: Diagnosis not present

## 2015-10-13 DIAGNOSIS — D508 Other iron deficiency anemias: Secondary | ICD-10-CM | POA: Diagnosis not present

## 2015-10-13 DIAGNOSIS — E1129 Type 2 diabetes mellitus with other diabetic kidney complication: Secondary | ICD-10-CM | POA: Diagnosis not present

## 2015-10-13 DIAGNOSIS — E209 Hypoparathyroidism, unspecified: Secondary | ICD-10-CM | POA: Diagnosis not present

## 2015-10-13 DIAGNOSIS — D689 Coagulation defect, unspecified: Secondary | ICD-10-CM | POA: Diagnosis not present

## 2015-10-13 DIAGNOSIS — A4102 Sepsis due to Methicillin resistant Staphylococcus aureus: Secondary | ICD-10-CM | POA: Diagnosis not present

## 2015-10-16 DIAGNOSIS — E209 Hypoparathyroidism, unspecified: Secondary | ICD-10-CM | POA: Diagnosis not present

## 2015-10-16 DIAGNOSIS — D689 Coagulation defect, unspecified: Secondary | ICD-10-CM | POA: Diagnosis not present

## 2015-10-16 DIAGNOSIS — D508 Other iron deficiency anemias: Secondary | ICD-10-CM | POA: Diagnosis not present

## 2015-10-16 DIAGNOSIS — D631 Anemia in chronic kidney disease: Secondary | ICD-10-CM | POA: Diagnosis not present

## 2015-10-16 DIAGNOSIS — N186 End stage renal disease: Secondary | ICD-10-CM | POA: Diagnosis not present

## 2015-10-16 DIAGNOSIS — D509 Iron deficiency anemia, unspecified: Secondary | ICD-10-CM | POA: Diagnosis not present

## 2015-10-16 DIAGNOSIS — E119 Type 2 diabetes mellitus without complications: Secondary | ICD-10-CM | POA: Diagnosis not present

## 2015-10-18 DIAGNOSIS — D508 Other iron deficiency anemias: Secondary | ICD-10-CM | POA: Diagnosis not present

## 2015-10-18 DIAGNOSIS — E209 Hypoparathyroidism, unspecified: Secondary | ICD-10-CM | POA: Diagnosis not present

## 2015-10-18 DIAGNOSIS — N186 End stage renal disease: Secondary | ICD-10-CM | POA: Diagnosis not present

## 2015-10-18 DIAGNOSIS — E119 Type 2 diabetes mellitus without complications: Secondary | ICD-10-CM | POA: Diagnosis not present

## 2015-10-18 DIAGNOSIS — D509 Iron deficiency anemia, unspecified: Secondary | ICD-10-CM | POA: Diagnosis not present

## 2015-10-18 DIAGNOSIS — D689 Coagulation defect, unspecified: Secondary | ICD-10-CM | POA: Diagnosis not present

## 2015-10-19 DIAGNOSIS — I739 Peripheral vascular disease, unspecified: Secondary | ICD-10-CM | POA: Diagnosis not present

## 2015-10-19 DIAGNOSIS — I1311 Hypertensive heart and chronic kidney disease without heart failure, with stage 5 chronic kidney disease, or end stage renal disease: Secondary | ICD-10-CM | POA: Diagnosis not present

## 2015-10-19 DIAGNOSIS — B9562 Methicillin resistant Staphylococcus aureus infection as the cause of diseases classified elsewhere: Secondary | ICD-10-CM | POA: Diagnosis not present

## 2015-10-19 DIAGNOSIS — E785 Hyperlipidemia, unspecified: Secondary | ICD-10-CM | POA: Diagnosis not present

## 2015-10-19 DIAGNOSIS — Z992 Dependence on renal dialysis: Secondary | ICD-10-CM | POA: Diagnosis not present

## 2015-10-19 DIAGNOSIS — N186 End stage renal disease: Secondary | ICD-10-CM | POA: Diagnosis not present

## 2015-10-19 DIAGNOSIS — I330 Acute and subacute infective endocarditis: Secondary | ICD-10-CM | POA: Diagnosis not present

## 2015-10-19 DIAGNOSIS — D649 Anemia, unspecified: Secondary | ICD-10-CM | POA: Diagnosis not present

## 2015-10-20 DIAGNOSIS — E119 Type 2 diabetes mellitus without complications: Secondary | ICD-10-CM | POA: Diagnosis not present

## 2015-10-20 DIAGNOSIS — D509 Iron deficiency anemia, unspecified: Secondary | ICD-10-CM | POA: Diagnosis not present

## 2015-10-20 DIAGNOSIS — N186 End stage renal disease: Secondary | ICD-10-CM | POA: Diagnosis not present

## 2015-10-20 DIAGNOSIS — D508 Other iron deficiency anemias: Secondary | ICD-10-CM | POA: Diagnosis not present

## 2015-10-20 DIAGNOSIS — D689 Coagulation defect, unspecified: Secondary | ICD-10-CM | POA: Diagnosis not present

## 2015-10-20 DIAGNOSIS — E209 Hypoparathyroidism, unspecified: Secondary | ICD-10-CM | POA: Diagnosis not present

## 2015-10-23 DIAGNOSIS — D689 Coagulation defect, unspecified: Secondary | ICD-10-CM | POA: Diagnosis not present

## 2015-10-23 DIAGNOSIS — E209 Hypoparathyroidism, unspecified: Secondary | ICD-10-CM | POA: Diagnosis not present

## 2015-10-23 DIAGNOSIS — E119 Type 2 diabetes mellitus without complications: Secondary | ICD-10-CM | POA: Diagnosis not present

## 2015-10-23 DIAGNOSIS — D509 Iron deficiency anemia, unspecified: Secondary | ICD-10-CM | POA: Diagnosis not present

## 2015-10-23 DIAGNOSIS — N186 End stage renal disease: Secondary | ICD-10-CM | POA: Diagnosis not present

## 2015-10-23 DIAGNOSIS — D508 Other iron deficiency anemias: Secondary | ICD-10-CM | POA: Diagnosis not present

## 2015-10-25 DIAGNOSIS — D509 Iron deficiency anemia, unspecified: Secondary | ICD-10-CM | POA: Diagnosis not present

## 2015-10-25 DIAGNOSIS — N186 End stage renal disease: Secondary | ICD-10-CM | POA: Diagnosis not present

## 2015-10-25 DIAGNOSIS — D689 Coagulation defect, unspecified: Secondary | ICD-10-CM | POA: Diagnosis not present

## 2015-10-25 DIAGNOSIS — E209 Hypoparathyroidism, unspecified: Secondary | ICD-10-CM | POA: Diagnosis not present

## 2015-10-25 DIAGNOSIS — E119 Type 2 diabetes mellitus without complications: Secondary | ICD-10-CM | POA: Diagnosis not present

## 2015-10-25 DIAGNOSIS — D508 Other iron deficiency anemias: Secondary | ICD-10-CM | POA: Diagnosis not present

## 2015-10-27 DIAGNOSIS — N186 End stage renal disease: Secondary | ICD-10-CM | POA: Diagnosis not present

## 2015-10-27 DIAGNOSIS — D509 Iron deficiency anemia, unspecified: Secondary | ICD-10-CM | POA: Diagnosis not present

## 2015-10-27 DIAGNOSIS — E119 Type 2 diabetes mellitus without complications: Secondary | ICD-10-CM | POA: Diagnosis not present

## 2015-10-27 DIAGNOSIS — D508 Other iron deficiency anemias: Secondary | ICD-10-CM | POA: Diagnosis not present

## 2015-10-27 DIAGNOSIS — D689 Coagulation defect, unspecified: Secondary | ICD-10-CM | POA: Diagnosis not present

## 2015-10-27 DIAGNOSIS — E209 Hypoparathyroidism, unspecified: Secondary | ICD-10-CM | POA: Diagnosis not present

## 2015-10-30 DIAGNOSIS — D689 Coagulation defect, unspecified: Secondary | ICD-10-CM | POA: Diagnosis not present

## 2015-10-30 DIAGNOSIS — D508 Other iron deficiency anemias: Secondary | ICD-10-CM | POA: Diagnosis not present

## 2015-10-30 DIAGNOSIS — E119 Type 2 diabetes mellitus without complications: Secondary | ICD-10-CM | POA: Diagnosis not present

## 2015-10-30 DIAGNOSIS — E209 Hypoparathyroidism, unspecified: Secondary | ICD-10-CM | POA: Diagnosis not present

## 2015-10-30 DIAGNOSIS — N186 End stage renal disease: Secondary | ICD-10-CM | POA: Diagnosis not present

## 2015-10-30 DIAGNOSIS — D509 Iron deficiency anemia, unspecified: Secondary | ICD-10-CM | POA: Diagnosis not present

## 2015-11-01 DIAGNOSIS — D689 Coagulation defect, unspecified: Secondary | ICD-10-CM | POA: Diagnosis not present

## 2015-11-01 DIAGNOSIS — D509 Iron deficiency anemia, unspecified: Secondary | ICD-10-CM | POA: Diagnosis not present

## 2015-11-01 DIAGNOSIS — D508 Other iron deficiency anemias: Secondary | ICD-10-CM | POA: Diagnosis not present

## 2015-11-01 DIAGNOSIS — N186 End stage renal disease: Secondary | ICD-10-CM | POA: Diagnosis not present

## 2015-11-01 DIAGNOSIS — E119 Type 2 diabetes mellitus without complications: Secondary | ICD-10-CM | POA: Diagnosis not present

## 2015-11-01 DIAGNOSIS — E209 Hypoparathyroidism, unspecified: Secondary | ICD-10-CM | POA: Diagnosis not present

## 2015-11-03 DIAGNOSIS — D508 Other iron deficiency anemias: Secondary | ICD-10-CM | POA: Diagnosis not present

## 2015-11-03 DIAGNOSIS — E119 Type 2 diabetes mellitus without complications: Secondary | ICD-10-CM | POA: Diagnosis not present

## 2015-11-03 DIAGNOSIS — E209 Hypoparathyroidism, unspecified: Secondary | ICD-10-CM | POA: Diagnosis not present

## 2015-11-03 DIAGNOSIS — D689 Coagulation defect, unspecified: Secondary | ICD-10-CM | POA: Diagnosis not present

## 2015-11-03 DIAGNOSIS — N186 End stage renal disease: Secondary | ICD-10-CM | POA: Diagnosis not present

## 2015-11-03 DIAGNOSIS — D509 Iron deficiency anemia, unspecified: Secondary | ICD-10-CM | POA: Diagnosis not present

## 2015-11-06 DIAGNOSIS — D508 Other iron deficiency anemias: Secondary | ICD-10-CM | POA: Diagnosis not present

## 2015-11-06 DIAGNOSIS — D689 Coagulation defect, unspecified: Secondary | ICD-10-CM | POA: Diagnosis not present

## 2015-11-06 DIAGNOSIS — E209 Hypoparathyroidism, unspecified: Secondary | ICD-10-CM | POA: Diagnosis not present

## 2015-11-06 DIAGNOSIS — N186 End stage renal disease: Secondary | ICD-10-CM | POA: Diagnosis not present

## 2015-11-06 DIAGNOSIS — E119 Type 2 diabetes mellitus without complications: Secondary | ICD-10-CM | POA: Diagnosis not present

## 2015-11-06 DIAGNOSIS — D509 Iron deficiency anemia, unspecified: Secondary | ICD-10-CM | POA: Diagnosis not present

## 2015-11-08 DIAGNOSIS — D508 Other iron deficiency anemias: Secondary | ICD-10-CM | POA: Diagnosis not present

## 2015-11-08 DIAGNOSIS — E119 Type 2 diabetes mellitus without complications: Secondary | ICD-10-CM | POA: Diagnosis not present

## 2015-11-08 DIAGNOSIS — N186 End stage renal disease: Secondary | ICD-10-CM | POA: Diagnosis not present

## 2015-11-08 DIAGNOSIS — E1129 Type 2 diabetes mellitus with other diabetic kidney complication: Secondary | ICD-10-CM | POA: Diagnosis not present

## 2015-11-08 DIAGNOSIS — D509 Iron deficiency anemia, unspecified: Secondary | ICD-10-CM | POA: Diagnosis not present

## 2015-11-08 DIAGNOSIS — E209 Hypoparathyroidism, unspecified: Secondary | ICD-10-CM | POA: Diagnosis not present

## 2015-11-08 DIAGNOSIS — D689 Coagulation defect, unspecified: Secondary | ICD-10-CM | POA: Diagnosis not present

## 2015-11-10 DIAGNOSIS — D689 Coagulation defect, unspecified: Secondary | ICD-10-CM | POA: Diagnosis not present

## 2015-11-10 DIAGNOSIS — D509 Iron deficiency anemia, unspecified: Secondary | ICD-10-CM | POA: Diagnosis not present

## 2015-11-10 DIAGNOSIS — E119 Type 2 diabetes mellitus without complications: Secondary | ICD-10-CM | POA: Diagnosis not present

## 2015-11-10 DIAGNOSIS — D508 Other iron deficiency anemias: Secondary | ICD-10-CM | POA: Diagnosis not present

## 2015-11-10 DIAGNOSIS — N186 End stage renal disease: Secondary | ICD-10-CM | POA: Diagnosis not present

## 2015-11-10 DIAGNOSIS — E209 Hypoparathyroidism, unspecified: Secondary | ICD-10-CM | POA: Diagnosis not present

## 2015-11-12 DIAGNOSIS — N186 End stage renal disease: Secondary | ICD-10-CM | POA: Diagnosis not present

## 2015-11-12 DIAGNOSIS — E1129 Type 2 diabetes mellitus with other diabetic kidney complication: Secondary | ICD-10-CM | POA: Diagnosis not present

## 2015-11-12 DIAGNOSIS — Z992 Dependence on renal dialysis: Secondary | ICD-10-CM | POA: Diagnosis not present

## 2015-11-13 DIAGNOSIS — D689 Coagulation defect, unspecified: Secondary | ICD-10-CM | POA: Diagnosis not present

## 2015-11-13 DIAGNOSIS — E119 Type 2 diabetes mellitus without complications: Secondary | ICD-10-CM | POA: Diagnosis not present

## 2015-11-13 DIAGNOSIS — D631 Anemia in chronic kidney disease: Secondary | ICD-10-CM | POA: Diagnosis not present

## 2015-11-13 DIAGNOSIS — Z992 Dependence on renal dialysis: Secondary | ICD-10-CM | POA: Diagnosis not present

## 2015-11-13 DIAGNOSIS — I871 Compression of vein: Secondary | ICD-10-CM | POA: Diagnosis not present

## 2015-11-13 DIAGNOSIS — T82858D Stenosis of vascular prosthetic devices, implants and grafts, subsequent encounter: Secondary | ICD-10-CM | POA: Diagnosis not present

## 2015-11-13 DIAGNOSIS — D509 Iron deficiency anemia, unspecified: Secondary | ICD-10-CM | POA: Diagnosis not present

## 2015-11-13 DIAGNOSIS — E209 Hypoparathyroidism, unspecified: Secondary | ICD-10-CM | POA: Diagnosis not present

## 2015-11-13 DIAGNOSIS — N186 End stage renal disease: Secondary | ICD-10-CM | POA: Diagnosis not present

## 2015-11-14 DIAGNOSIS — Y841 Kidney dialysis as the cause of abnormal reaction of the patient, or of later complication, without mention of misadventure at the time of the procedure: Secondary | ICD-10-CM | POA: Diagnosis not present

## 2015-11-14 DIAGNOSIS — I120 Hypertensive chronic kidney disease with stage 5 chronic kidney disease or end stage renal disease: Secondary | ICD-10-CM | POA: Diagnosis not present

## 2015-11-14 DIAGNOSIS — T82898A Other specified complication of vascular prosthetic devices, implants and grafts, initial encounter: Secondary | ICD-10-CM | POA: Diagnosis not present

## 2015-11-14 DIAGNOSIS — E119 Type 2 diabetes mellitus without complications: Secondary | ICD-10-CM | POA: Diagnosis not present

## 2015-11-14 DIAGNOSIS — Z01810 Encounter for preprocedural cardiovascular examination: Secondary | ICD-10-CM | POA: Diagnosis not present

## 2015-11-14 DIAGNOSIS — N186 End stage renal disease: Secondary | ICD-10-CM | POA: Diagnosis not present

## 2015-11-14 DIAGNOSIS — Z992 Dependence on renal dialysis: Secondary | ICD-10-CM | POA: Diagnosis not present

## 2015-11-15 DIAGNOSIS — D631 Anemia in chronic kidney disease: Secondary | ICD-10-CM | POA: Diagnosis not present

## 2015-11-15 DIAGNOSIS — E119 Type 2 diabetes mellitus without complications: Secondary | ICD-10-CM | POA: Diagnosis not present

## 2015-11-15 DIAGNOSIS — N186 End stage renal disease: Secondary | ICD-10-CM | POA: Diagnosis not present

## 2015-11-15 DIAGNOSIS — D509 Iron deficiency anemia, unspecified: Secondary | ICD-10-CM | POA: Diagnosis not present

## 2015-11-15 DIAGNOSIS — E209 Hypoparathyroidism, unspecified: Secondary | ICD-10-CM | POA: Diagnosis not present

## 2015-11-15 DIAGNOSIS — D689 Coagulation defect, unspecified: Secondary | ICD-10-CM | POA: Diagnosis not present

## 2015-11-17 DIAGNOSIS — D509 Iron deficiency anemia, unspecified: Secondary | ICD-10-CM | POA: Diagnosis not present

## 2015-11-17 DIAGNOSIS — E119 Type 2 diabetes mellitus without complications: Secondary | ICD-10-CM | POA: Diagnosis not present

## 2015-11-17 DIAGNOSIS — N186 End stage renal disease: Secondary | ICD-10-CM | POA: Diagnosis not present

## 2015-11-17 DIAGNOSIS — D689 Coagulation defect, unspecified: Secondary | ICD-10-CM | POA: Diagnosis not present

## 2015-11-17 DIAGNOSIS — D631 Anemia in chronic kidney disease: Secondary | ICD-10-CM | POA: Diagnosis not present

## 2015-11-17 DIAGNOSIS — E209 Hypoparathyroidism, unspecified: Secondary | ICD-10-CM | POA: Diagnosis not present

## 2015-11-20 DIAGNOSIS — E119 Type 2 diabetes mellitus without complications: Secondary | ICD-10-CM | POA: Diagnosis not present

## 2015-11-20 DIAGNOSIS — D509 Iron deficiency anemia, unspecified: Secondary | ICD-10-CM | POA: Diagnosis not present

## 2015-11-20 DIAGNOSIS — D689 Coagulation defect, unspecified: Secondary | ICD-10-CM | POA: Diagnosis not present

## 2015-11-20 DIAGNOSIS — D631 Anemia in chronic kidney disease: Secondary | ICD-10-CM | POA: Diagnosis not present

## 2015-11-20 DIAGNOSIS — N186 End stage renal disease: Secondary | ICD-10-CM | POA: Diagnosis not present

## 2015-11-20 DIAGNOSIS — E209 Hypoparathyroidism, unspecified: Secondary | ICD-10-CM | POA: Diagnosis not present

## 2015-11-22 DIAGNOSIS — D689 Coagulation defect, unspecified: Secondary | ICD-10-CM | POA: Diagnosis not present

## 2015-11-22 DIAGNOSIS — E209 Hypoparathyroidism, unspecified: Secondary | ICD-10-CM | POA: Diagnosis not present

## 2015-11-22 DIAGNOSIS — N186 End stage renal disease: Secondary | ICD-10-CM | POA: Diagnosis not present

## 2015-11-22 DIAGNOSIS — D631 Anemia in chronic kidney disease: Secondary | ICD-10-CM | POA: Diagnosis not present

## 2015-11-22 DIAGNOSIS — D509 Iron deficiency anemia, unspecified: Secondary | ICD-10-CM | POA: Diagnosis not present

## 2015-11-22 DIAGNOSIS — E119 Type 2 diabetes mellitus without complications: Secondary | ICD-10-CM | POA: Diagnosis not present

## 2015-11-24 DIAGNOSIS — E209 Hypoparathyroidism, unspecified: Secondary | ICD-10-CM | POA: Diagnosis not present

## 2015-11-24 DIAGNOSIS — D631 Anemia in chronic kidney disease: Secondary | ICD-10-CM | POA: Diagnosis not present

## 2015-11-24 DIAGNOSIS — D689 Coagulation defect, unspecified: Secondary | ICD-10-CM | POA: Diagnosis not present

## 2015-11-24 DIAGNOSIS — E119 Type 2 diabetes mellitus without complications: Secondary | ICD-10-CM | POA: Diagnosis not present

## 2015-11-24 DIAGNOSIS — D509 Iron deficiency anemia, unspecified: Secondary | ICD-10-CM | POA: Diagnosis not present

## 2015-11-24 DIAGNOSIS — N186 End stage renal disease: Secondary | ICD-10-CM | POA: Diagnosis not present

## 2015-11-27 DIAGNOSIS — N186 End stage renal disease: Secondary | ICD-10-CM | POA: Diagnosis not present

## 2015-11-27 DIAGNOSIS — D509 Iron deficiency anemia, unspecified: Secondary | ICD-10-CM | POA: Diagnosis not present

## 2015-11-27 DIAGNOSIS — D689 Coagulation defect, unspecified: Secondary | ICD-10-CM | POA: Diagnosis not present

## 2015-11-27 DIAGNOSIS — E209 Hypoparathyroidism, unspecified: Secondary | ICD-10-CM | POA: Diagnosis not present

## 2015-11-27 DIAGNOSIS — E119 Type 2 diabetes mellitus without complications: Secondary | ICD-10-CM | POA: Diagnosis not present

## 2015-11-27 DIAGNOSIS — D631 Anemia in chronic kidney disease: Secondary | ICD-10-CM | POA: Diagnosis not present

## 2015-11-28 DIAGNOSIS — E119 Type 2 diabetes mellitus without complications: Secondary | ICD-10-CM | POA: Diagnosis not present

## 2015-11-28 DIAGNOSIS — D631 Anemia in chronic kidney disease: Secondary | ICD-10-CM | POA: Diagnosis not present

## 2015-11-28 DIAGNOSIS — N186 End stage renal disease: Secondary | ICD-10-CM | POA: Diagnosis not present

## 2015-11-28 DIAGNOSIS — D509 Iron deficiency anemia, unspecified: Secondary | ICD-10-CM | POA: Diagnosis not present

## 2015-11-28 DIAGNOSIS — E209 Hypoparathyroidism, unspecified: Secondary | ICD-10-CM | POA: Diagnosis not present

## 2015-11-28 DIAGNOSIS — D689 Coagulation defect, unspecified: Secondary | ICD-10-CM | POA: Diagnosis not present

## 2015-11-29 DIAGNOSIS — E119 Type 2 diabetes mellitus without complications: Secondary | ICD-10-CM | POA: Diagnosis not present

## 2015-11-29 DIAGNOSIS — D509 Iron deficiency anemia, unspecified: Secondary | ICD-10-CM | POA: Diagnosis not present

## 2015-11-29 DIAGNOSIS — D689 Coagulation defect, unspecified: Secondary | ICD-10-CM | POA: Diagnosis not present

## 2015-11-29 DIAGNOSIS — E209 Hypoparathyroidism, unspecified: Secondary | ICD-10-CM | POA: Diagnosis not present

## 2015-11-29 DIAGNOSIS — D631 Anemia in chronic kidney disease: Secondary | ICD-10-CM | POA: Diagnosis not present

## 2015-11-29 DIAGNOSIS — N186 End stage renal disease: Secondary | ICD-10-CM | POA: Diagnosis not present

## 2015-12-01 DIAGNOSIS — D689 Coagulation defect, unspecified: Secondary | ICD-10-CM | POA: Diagnosis not present

## 2015-12-01 DIAGNOSIS — D631 Anemia in chronic kidney disease: Secondary | ICD-10-CM | POA: Diagnosis not present

## 2015-12-01 DIAGNOSIS — E209 Hypoparathyroidism, unspecified: Secondary | ICD-10-CM | POA: Diagnosis not present

## 2015-12-01 DIAGNOSIS — N186 End stage renal disease: Secondary | ICD-10-CM | POA: Diagnosis not present

## 2015-12-01 DIAGNOSIS — D509 Iron deficiency anemia, unspecified: Secondary | ICD-10-CM | POA: Diagnosis not present

## 2015-12-01 DIAGNOSIS — E119 Type 2 diabetes mellitus without complications: Secondary | ICD-10-CM | POA: Diagnosis not present

## 2015-12-04 DIAGNOSIS — N186 End stage renal disease: Secondary | ICD-10-CM | POA: Diagnosis not present

## 2015-12-04 DIAGNOSIS — I340 Nonrheumatic mitral (valve) insufficiency: Secondary | ICD-10-CM | POA: Diagnosis not present

## 2015-12-04 DIAGNOSIS — E209 Hypoparathyroidism, unspecified: Secondary | ICD-10-CM | POA: Diagnosis not present

## 2015-12-04 DIAGNOSIS — E119 Type 2 diabetes mellitus without complications: Secondary | ICD-10-CM | POA: Diagnosis not present

## 2015-12-04 DIAGNOSIS — I361 Nonrheumatic tricuspid (valve) insufficiency: Secondary | ICD-10-CM | POA: Diagnosis not present

## 2015-12-04 DIAGNOSIS — R7881 Bacteremia: Secondary | ICD-10-CM | POA: Diagnosis not present

## 2015-12-04 DIAGNOSIS — D689 Coagulation defect, unspecified: Secondary | ICD-10-CM | POA: Diagnosis not present

## 2015-12-04 DIAGNOSIS — D631 Anemia in chronic kidney disease: Secondary | ICD-10-CM | POA: Diagnosis not present

## 2015-12-04 DIAGNOSIS — D509 Iron deficiency anemia, unspecified: Secondary | ICD-10-CM | POA: Diagnosis not present

## 2015-12-05 ENCOUNTER — Encounter (HOSPITAL_BASED_OUTPATIENT_CLINIC_OR_DEPARTMENT_OTHER): Payer: Self-pay | Admitting: Emergency Medicine

## 2015-12-05 ENCOUNTER — Emergency Department (HOSPITAL_BASED_OUTPATIENT_CLINIC_OR_DEPARTMENT_OTHER)
Admission: EM | Admit: 2015-12-05 | Discharge: 2015-12-06 | Disposition: A | Discharge status: Home / Self Care | Payer: Medicare Other | Attending: Emergency Medicine | Admitting: Emergency Medicine

## 2015-12-05 DIAGNOSIS — Z794 Long term (current) use of insulin: Secondary | ICD-10-CM | POA: Diagnosis not present

## 2015-12-05 DIAGNOSIS — Z992 Dependence on renal dialysis: Secondary | ICD-10-CM | POA: Insufficient documentation

## 2015-12-05 DIAGNOSIS — F17210 Nicotine dependence, cigarettes, uncomplicated: Secondary | ICD-10-CM | POA: Insufficient documentation

## 2015-12-05 DIAGNOSIS — E11621 Type 2 diabetes mellitus with foot ulcer: Secondary | ICD-10-CM | POA: Diagnosis not present

## 2015-12-05 DIAGNOSIS — L97419 Non-pressure chronic ulcer of right heel and midfoot with unspecified severity: Secondary | ICD-10-CM | POA: Diagnosis not present

## 2015-12-05 DIAGNOSIS — Z6833 Body mass index (BMI) 33.0-33.9, adult: Secondary | ICD-10-CM | POA: Diagnosis not present

## 2015-12-05 DIAGNOSIS — I1311 Hypertensive heart and chronic kidney disease without heart failure, with stage 5 chronic kidney disease, or end stage renal disease: Secondary | ICD-10-CM | POA: Insufficient documentation

## 2015-12-05 DIAGNOSIS — E118 Type 2 diabetes mellitus with unspecified complications: Secondary | ICD-10-CM

## 2015-12-05 DIAGNOSIS — M79674 Pain in right toe(s): Secondary | ICD-10-CM | POA: Diagnosis present

## 2015-12-05 DIAGNOSIS — N186 End stage renal disease: Secondary | ICD-10-CM | POA: Diagnosis not present

## 2015-12-05 MED ORDER — CEPHALEXIN 250 MG PO CAPS
500.0000 mg | ORAL_CAPSULE | Freq: Once | ORAL | Status: AC
  Administered 2015-12-05: 500 mg via ORAL
  Filled 2015-12-05: qty 2

## 2015-12-05 NOTE — ED Provider Notes (Signed)
CSN: [id number]     Arrival date & time 12/05/15  2116 History   First MD Initiated Contact with Patient 12/05/15 2250     Chief Complaint  Patient presents with  . Toe Pain    (Consider location/radiation/quality/duration/timing/severity/associated sxs/prior Treatment) Patient is a 50 y.o. male presenting with toe pain. The history is provided by the patient and medical records. No language interpreter was used.  Toe Pain Pertinent negatives include no abdominal pain, arthralgias, congestion, coughing, fever, headaches or myalgias.  [name] is a 50 y.o. male  with a PMH of ESRD on dialysis, HTN, DM who presents to the Emergency Department complaining of wound to his right great toe and top of right 2nd toe which he first noticed this morning. Denies pain, but admits to decreased sensation in this extremity. Believes that his shoe rubbed against toes causing wound. No trauma. No medication/treatments prior to arrival. Admits to clear drainage, no pus. Denies fever, redness. Prior ankle fusion of right ankle.    Past Medical History  Diagnosis Date  . Hypertension   . Chest pain   . Morbid obesity ([place])   . Bronchitis   . Blood transfusion   . H/O hiatal hernia   . Chronic kidney disease     dialysis, [facility]W-F, [facility]  . GERD (gastroesophageal reflux disease)   . Dysrhythmia     irregular heartbeat  . Hyperparathyroidism ([place])   . ESRD (end stage renal disease) on dialysis ([place])     first HD 06/19/04  . Pneumonia   . Diabetes mellitus     type 2  . Anemia    Past Surgical History  Procedure Laterality Date  . Av fistula placement  10/11/2010  . Ankle fusion      rt foot charcot joint 2008 with refusion  . Av fistula placement  07/02/2011    Procedure: INSERTION OF ARTERIOVENOUS (AV) GORE-TEX GRAFT ARM;  Surgeon: Elam Dutch, MD;  Location: [facility];  Service: Vascular;  Laterality: Left;  . Thrombectomy    . Insertion of dialysis catheter  01/20/2012    Procedure:  INSERTION OF DIALYSIS CATHETER;  Surgeon: Serafina Mitchell, MD;  Location: [facility];  Service: Vascular;  Laterality: N/A;  insertion left internal jugular dialysis catheter 23cm  . Umbilcial hernia    . Parathyroidectomy    . Avgg removal Right 11/06/2012    Procedure: REMOVAL OF ARTERIOVENOUS GORETEX GRAFT ([place]);  Surgeon: Mal Misty, MD;  Location: [place];  Service: Vascular;  Laterality: Right;  . Av fistula placement Right 11/06/2012    Procedure: INSERTION OF ARTERIOVENOUS (AV) GORE-TEX GRAFT ARM;  Surgeon: Mal Misty, MD;  Location: [facility];  Service: Vascular;  Laterality: Right;  . Insertion of dialysis catheter Right 11/09/2012    Procedure: INSERTION OF DIALYSIS CATHETER;  Surgeon: Conrad [place], MD;  Location: [place];  Service: Vascular;  Laterality: Right;  Right  Femoral Vein  . Venogram N/A 01/06/2014    Procedure: VENOGRAM;  Surgeon: Conrad [place], MD;  Location: [facility] CATH LAB;  Service: Cardiovascular;  Laterality: N/A;  . Amputation Left 01/20/2015    Procedure: Left Great Toe Amputation;  Surgeon: Newt Minion, MD;  Location: [place];  Service: Orthopedics;  Laterality: Left;  . Amputation Left 03/07/2015    Procedure: Left Foot 1st and 2nd Ray Amputation;  Surgeon: Newt Minion, MD;  Location: [place];  Service: Orthopedics;  Laterality: Left;  . Amputation Left 04/14/2015  Procedure: LEFT BELOW THE KNEE AMPUTATION ;  Surgeon: Newt Minion, MD;  Location: [place];  Service: Orthopedics;  Laterality: Left;  . Removal of a hero device Left 08/14/2015    Procedure: REMOVAL OF LEFT ARM HERO DEVICE;  Surgeon: Rosetta Posner, MD;  Location: [place];  Service: Vascular;  Laterality: Left;  . Insertion of dialysis catheter N/A 08/18/2015    Procedure: INSERTION OF DIALYSIS CATHETER RIGHT FEMORAL VEIN;  Surgeon: Mal Misty, MD;  Location: [place];  Service: Cardiovascular;  Laterality: N/A;  . Tee without cardioversion N/A 08/18/2015    Procedure: TRANSESOPHAGEAL ECHOCARDIOGRAM (TEE);  Surgeon: Mal Misty, MD;  Location: [facility] OR;  Service: Cardiovascular;  Laterality: N/A;   Family History  Problem Relation Age of Onset  . Heart disease Mother   . Kidney disease Mother   . Hypertension Mother    Social History  Substance Use Topics  . Smoking status: Current Every Day Smoker -- 0.10 packs/day for 20 years    Types: Cigarettes    Last Attempt to Quit: 01/13/2015  . Smokeless tobacco: Never Used  . Alcohol Use: No    Review of Systems  Constitutional: Negative for fever.  HENT: Negative for congestion.   Eyes: Negative for visual disturbance.  Respiratory: Negative for cough.   Cardiovascular: Negative.   Gastrointestinal: Negative for abdominal pain.  Musculoskeletal: Negative for myalgias and arthralgias.  Skin: Positive for wound.  Neurological: Negative for headaches.  Hematological: Does not bruise/bleed easily.      Allergies  Review of patient's allergies indicates no known allergies.  Home Medications   Prior to Admission medications   Medication Sig Start Date End Date Taking? Authorizing Provider  aspirin EC 81 MG EC tablet Take 1 tablet (81 mg total) by mouth daily. 08/19/15   Florinda Marker, MD  b complex-vitamin c-folic acid (NEPHRO-VITE) 0.8 MG TABS tablet Take 1 tablet by mouth at bedtime. 04/26/15   Lavon Paganini Angiulli, PA-C  calcitRIOL (ROCALTROL) 0.5 MCG capsule Take 5 capsules (2.5 mcg total) by mouth every Monday, Wednesday, and Friday with hemodialysis. 04/26/15   Lavon Paganini Angiulli, PA-C  calcium carbonate (TUMS - DOSED IN MG ELEMENTAL CALCIUM) 500 MG chewable tablet Chew 4 tablets (800 mg of elemental calcium total) by mouth 3 (three) times daily with meals. 04/26/15   Lavon Paganini Angiulli, PA-C  cephALEXin (KEFLEX) 500 MG capsule Take 1 capsule (500 mg total) by mouth 4 (four) times daily. 12/06/15   Ozella Almond Arlicia Paquette, PA-C  famotidine (PEPCID) 20 MG tablet Take 1 tablet (20 mg total) by mouth at bedtime. 04/26/15   Lavon Paganini Angiulli, PA-C  insulin  detemir (LEVEMIR) 100 UNIT/ML injection Inject 0.2 mLs (20 Units total) into the skin at bedtime. 04/26/15   Lavon Paganini Angiulli, PA-C  methocarbamol (ROBAXIN) 500 MG tablet Take 1 tablet (500 mg total) by mouth every 6 (six) hours as needed for muscle spasms. 04/26/15   Lavon Paganini Angiulli, PA-C  oxyCODONE (OXY IR/ROXICODONE) 5 MG immediate release tablet Take 1-2 tablets (5-10 mg total) by mouth every 4 (four) hours as needed for breakthrough pain. 08/19/15   Florinda Marker, MD  vancomycin (VANCOCIN) 1 GM/200ML SOLN Inject 200 mLs (1,000 mg total) into the vein every Monday, Wednesday, and Friday with hemodialysis. Stop Date: 09/27/15 as last dose 08/19/15   Alexa Angela Burke, MD   BP 116/63 mmHg  Pulse 100  Temp(Src) 98 F (36.7 C) (Oral)  Resp 16  Ht 6' (  1.829 m)  Wt 112.492 kg  BMI 33.63 kg/m2  SpO2 98% Physical Exam  Constitutional: He is oriented to person, place, and time. He appears well-developed and well-nourished.  Alert and in no acute distress  HENT:  Head: Normocephalic and atraumatic.  Cardiovascular: Normal rate, regular rhythm and normal heart sounds.  Exam reveals no gallop and no friction rub.   No murmur heard. Distal pulses intact.   Pulmonary/Chest: Effort normal and breath sounds normal. No respiratory distress. He has no wheezes. He has no rales.  Abdominal: Soft. He exhibits no distension. There is no tenderness.  Musculoskeletal: Normal range of motion.  Neurological: He is alert and oriented to person, place, and time.  Decreased sensation to RLE which patient states is baseline.   Skin: Skin is warm and dry.  Right 2nd toe with 1 cm circular superficial skin wound. Medial aspect of right great toe with loss of first skin layer. Dry cracked skin, but no purulent drainage, surrounding erythema, streaking.   Nursing note and vitals reviewed.   ED Course  Procedures (including critical care time) Labs Review Labs Reviewed - No data to display  Imaging Review No  results found. I have personally reviewed and evaluated these images and lab results as part of my medical decision-making.   EKG Interpretation None      MDM   Final diagnoses:  Diabetic foot ([place])   [name] presents to ED for wound of right big toe and 2nd digit. On exam, skin wound present but does not appear infected. Patient states first layer of skin fell off today. Given poor wound healing, history of diabetes will treat prophylactically with Keflex. Area cleaned and dressed while in ED. Patient encouraged to follow up with PCP as he will probably needs wound care or podiatry for this area to heal properly. Return precautions discussed. All questions answered.  [facility] Hanaa Payes, PA-C 12/06/15 [id number]  Gareth Morgan, MD 12/06/15 1353

## 2015-12-05 NOTE — ED Notes (Signed)
Pt in c/o possible infection to R great toe. Hx of diabetes. Pt alert, interactive, NAD.

## 2015-12-06 DIAGNOSIS — D689 Coagulation defect, unspecified: Secondary | ICD-10-CM | POA: Diagnosis not present

## 2015-12-06 DIAGNOSIS — E209 Hypoparathyroidism, unspecified: Secondary | ICD-10-CM | POA: Diagnosis not present

## 2015-12-06 DIAGNOSIS — N186 End stage renal disease: Secondary | ICD-10-CM | POA: Diagnosis not present

## 2015-12-06 DIAGNOSIS — D631 Anemia in chronic kidney disease: Secondary | ICD-10-CM | POA: Diagnosis not present

## 2015-12-06 DIAGNOSIS — D509 Iron deficiency anemia, unspecified: Secondary | ICD-10-CM | POA: Diagnosis not present

## 2015-12-06 DIAGNOSIS — E119 Type 2 diabetes mellitus without complications: Secondary | ICD-10-CM | POA: Diagnosis not present

## 2015-12-06 MED ORDER — CEPHALEXIN 500 MG PO CAPS: 500.0000 mg | ORAL_CAPSULE | Freq: Four times a day (QID) | ORAL | Status: DC

## 2015-12-06 NOTE — Discharge Instructions (Signed)
Call your primary care physician in the morning to schedule a follow-up appointment. I would like you to see them sometime this week. Let them know that you have been seen in the ER and have a diabetic wound on her foot that needs wound care. Please take all of your antibiotics until finished! Please check your wound daily for signs of infection including redness surrounding the area, pain, or pus draining from the area. Return to the ER for any of these signs of infection, fever, new or worsening symptoms, any additional concerns.

## 2015-12-08 DIAGNOSIS — E119 Type 2 diabetes mellitus without complications: Secondary | ICD-10-CM | POA: Diagnosis not present

## 2015-12-08 DIAGNOSIS — N186 End stage renal disease: Secondary | ICD-10-CM | POA: Diagnosis not present

## 2015-12-08 DIAGNOSIS — D631 Anemia in chronic kidney disease: Secondary | ICD-10-CM | POA: Diagnosis not present

## 2015-12-08 DIAGNOSIS — D509 Iron deficiency anemia, unspecified: Secondary | ICD-10-CM | POA: Diagnosis not present

## 2015-12-08 DIAGNOSIS — E209 Hypoparathyroidism, unspecified: Secondary | ICD-10-CM | POA: Diagnosis not present

## 2015-12-08 DIAGNOSIS — D689 Coagulation defect, unspecified: Secondary | ICD-10-CM | POA: Diagnosis not present

## 2015-12-11 DIAGNOSIS — D689 Coagulation defect, unspecified: Secondary | ICD-10-CM | POA: Diagnosis not present

## 2015-12-11 DIAGNOSIS — N186 End stage renal disease: Secondary | ICD-10-CM | POA: Diagnosis not present

## 2015-12-11 DIAGNOSIS — E119 Type 2 diabetes mellitus without complications: Secondary | ICD-10-CM | POA: Diagnosis not present

## 2015-12-11 DIAGNOSIS — D509 Iron deficiency anemia, unspecified: Secondary | ICD-10-CM | POA: Diagnosis not present

## 2015-12-11 DIAGNOSIS — E209 Hypoparathyroidism, unspecified: Secondary | ICD-10-CM | POA: Diagnosis not present

## 2015-12-11 DIAGNOSIS — D631 Anemia in chronic kidney disease: Secondary | ICD-10-CM | POA: Diagnosis not present

## 2015-12-12 ENCOUNTER — Encounter (HOSPITAL_COMMUNITY): Admission: RE | Payer: Self-pay | Source: Ambulatory Visit

## 2015-12-12 SURGERY — ECHOCARDIOGRAM, TRANSESOPHAGEAL
Anesthesia: Moderate Sedation

## 2015-12-13 DIAGNOSIS — D631 Anemia in chronic kidney disease: Secondary | ICD-10-CM | POA: Diagnosis not present

## 2015-12-13 DIAGNOSIS — Z992 Dependence on renal dialysis: Secondary | ICD-10-CM | POA: Diagnosis not present

## 2015-12-13 DIAGNOSIS — E1129 Type 2 diabetes mellitus with other diabetic kidney complication: Secondary | ICD-10-CM | POA: Diagnosis not present

## 2015-12-13 DIAGNOSIS — D509 Iron deficiency anemia, unspecified: Secondary | ICD-10-CM | POA: Diagnosis not present

## 2015-12-13 DIAGNOSIS — E119 Type 2 diabetes mellitus without complications: Secondary | ICD-10-CM | POA: Diagnosis not present

## 2015-12-13 DIAGNOSIS — N186 End stage renal disease: Secondary | ICD-10-CM | POA: Diagnosis not present

## 2015-12-13 DIAGNOSIS — D689 Coagulation defect, unspecified: Secondary | ICD-10-CM | POA: Diagnosis not present

## 2015-12-13 DIAGNOSIS — E209 Hypoparathyroidism, unspecified: Secondary | ICD-10-CM | POA: Diagnosis not present

## 2015-12-14 ENCOUNTER — Ambulatory Visit (HOSPITAL_COMMUNITY): Admission: RE | Admit: 2015-12-14 | Payer: Medicare Other | Source: Ambulatory Visit | Admitting: Cardiovascular Disease

## 2015-12-14 DIAGNOSIS — B351 Tinea unguium: Secondary | ICD-10-CM | POA: Diagnosis not present

## 2015-12-14 DIAGNOSIS — E1142 Type 2 diabetes mellitus with diabetic polyneuropathy: Secondary | ICD-10-CM | POA: Diagnosis not present

## 2015-12-14 DIAGNOSIS — E11621 Type 2 diabetes mellitus with foot ulcer: Secondary | ICD-10-CM | POA: Diagnosis not present

## 2015-12-14 DIAGNOSIS — E1159 Type 2 diabetes mellitus with other circulatory complications: Secondary | ICD-10-CM | POA: Diagnosis not present

## 2015-12-14 DIAGNOSIS — L89893 Pressure ulcer of other site, stage 3: Secondary | ICD-10-CM | POA: Diagnosis not present

## 2015-12-15 DIAGNOSIS — E8770 Fluid overload, unspecified: Secondary | ICD-10-CM | POA: Diagnosis not present

## 2015-12-15 DIAGNOSIS — D631 Anemia in chronic kidney disease: Secondary | ICD-10-CM | POA: Diagnosis not present

## 2015-12-15 DIAGNOSIS — D509 Iron deficiency anemia, unspecified: Secondary | ICD-10-CM | POA: Diagnosis not present

## 2015-12-15 DIAGNOSIS — D689 Coagulation defect, unspecified: Secondary | ICD-10-CM | POA: Diagnosis not present

## 2015-12-15 DIAGNOSIS — N186 End stage renal disease: Secondary | ICD-10-CM | POA: Diagnosis not present

## 2015-12-15 DIAGNOSIS — E209 Hypoparathyroidism, unspecified: Secondary | ICD-10-CM | POA: Diagnosis not present

## 2015-12-18 DIAGNOSIS — E209 Hypoparathyroidism, unspecified: Secondary | ICD-10-CM | POA: Diagnosis not present

## 2015-12-18 DIAGNOSIS — D631 Anemia in chronic kidney disease: Secondary | ICD-10-CM | POA: Diagnosis not present

## 2015-12-18 DIAGNOSIS — N186 End stage renal disease: Secondary | ICD-10-CM | POA: Diagnosis not present

## 2015-12-18 DIAGNOSIS — D509 Iron deficiency anemia, unspecified: Secondary | ICD-10-CM | POA: Diagnosis not present

## 2015-12-18 DIAGNOSIS — D689 Coagulation defect, unspecified: Secondary | ICD-10-CM | POA: Diagnosis not present

## 2015-12-18 DIAGNOSIS — E8770 Fluid overload, unspecified: Secondary | ICD-10-CM | POA: Diagnosis not present

## 2015-12-20 DIAGNOSIS — E8770 Fluid overload, unspecified: Secondary | ICD-10-CM | POA: Diagnosis not present

## 2015-12-20 DIAGNOSIS — D631 Anemia in chronic kidney disease: Secondary | ICD-10-CM | POA: Diagnosis not present

## 2015-12-20 DIAGNOSIS — N186 End stage renal disease: Secondary | ICD-10-CM | POA: Diagnosis not present

## 2015-12-20 DIAGNOSIS — E209 Hypoparathyroidism, unspecified: Secondary | ICD-10-CM | POA: Diagnosis not present

## 2015-12-20 DIAGNOSIS — D509 Iron deficiency anemia, unspecified: Secondary | ICD-10-CM | POA: Diagnosis not present

## 2015-12-20 DIAGNOSIS — D689 Coagulation defect, unspecified: Secondary | ICD-10-CM | POA: Diagnosis not present

## 2015-12-21 ENCOUNTER — Encounter (HOSPITAL_COMMUNITY): Payer: Self-pay | Admitting: *Deleted

## 2015-12-21 ENCOUNTER — Ambulatory Visit (HOSPITAL_COMMUNITY)
Admission: RE | Admit: 2015-12-21 | Discharge: 2015-12-21 | Disposition: A | Discharge status: Home / Self Care | Payer: Medicare Other | Source: Ambulatory Visit | Attending: Cardiovascular Disease | Admitting: Cardiovascular Disease

## 2015-12-21 ENCOUNTER — Encounter (HOSPITAL_COMMUNITY)
Admission: RE | Disposition: A | Discharge status: Home / Self Care | Payer: Self-pay | Source: Ambulatory Visit | Attending: Cardiovascular Disease

## 2015-12-21 DIAGNOSIS — Z7982 Long term (current) use of aspirin: Secondary | ICD-10-CM | POA: Insufficient documentation

## 2015-12-21 DIAGNOSIS — I129 Hypertensive chronic kidney disease with stage 1 through stage 4 chronic kidney disease, or unspecified chronic kidney disease: Secondary | ICD-10-CM | POA: Diagnosis not present

## 2015-12-21 DIAGNOSIS — Z79899 Other long term (current) drug therapy: Secondary | ICD-10-CM | POA: Diagnosis not present

## 2015-12-21 DIAGNOSIS — I361 Nonrheumatic tricuspid (valve) insufficiency: Secondary | ICD-10-CM | POA: Diagnosis not present

## 2015-12-21 DIAGNOSIS — R7881 Bacteremia: Secondary | ICD-10-CM | POA: Diagnosis not present

## 2015-12-21 DIAGNOSIS — I340 Nonrheumatic mitral (valve) insufficiency: Secondary | ICD-10-CM | POA: Diagnosis not present

## 2015-12-21 DIAGNOSIS — I081 Rheumatic disorders of both mitral and tricuspid valves: Secondary | ICD-10-CM | POA: Insufficient documentation

## 2015-12-21 DIAGNOSIS — N189 Chronic kidney disease, unspecified: Secondary | ICD-10-CM | POA: Insufficient documentation

## 2015-12-21 DIAGNOSIS — Z794 Long term (current) use of insulin: Secondary | ICD-10-CM | POA: Insufficient documentation

## 2015-12-21 DIAGNOSIS — I429 Cardiomyopathy, unspecified: Secondary | ICD-10-CM | POA: Insufficient documentation

## 2015-12-21 DIAGNOSIS — Z8614 Personal history of Methicillin resistant Staphylococcus aureus infection: Secondary | ICD-10-CM | POA: Diagnosis not present

## 2015-12-21 HISTORY — PX: TEE WITHOUT CARDIOVERSION: SHX5443

## 2015-12-21 SURGERY — ECHOCARDIOGRAM, TRANSESOPHAGEAL
Anesthesia: Moderate Sedation

## 2015-12-21 MED ORDER — MIDAZOLAM HCL 5 MG/ML IJ SOLN
INTRAMUSCULAR | Status: AC
  Filled 2015-12-21: qty 2

## 2015-12-21 MED ORDER — FENTANYL CITRATE (PF) 100 MCG/2ML IJ SOLN
INTRAMUSCULAR | Status: DC | PRN
  Administered 2015-12-21 (×2): 25 ug via INTRAVENOUS

## 2015-12-21 MED ORDER — SODIUM CHLORIDE 0.9 % IV SOLN: INTRAVENOUS | Status: DC

## 2015-12-21 MED ORDER — BUTAMBEN-TETRACAINE-BENZOCAINE 2-2-14 % EX AERO
INHALATION_SPRAY | CUTANEOUS | Status: DC | PRN
  Administered 2015-12-21: 2 via TOPICAL

## 2015-12-21 MED ORDER — FENTANYL CITRATE (PF) 100 MCG/2ML IJ SOLN
INTRAMUSCULAR | Status: AC
  Filled 2015-12-21: qty 2

## 2015-12-21 MED ORDER — MIDAZOLAM HCL 10 MG/2ML IJ SOLN
INTRAMUSCULAR | Status: DC | PRN
  Administered 2015-12-21: 1 mg via INTRAVENOUS
  Administered 2015-12-21 (×2): 2 mg via INTRAVENOUS

## 2015-12-21 NOTE — Progress Notes (Signed)
  Echocardiogram 2D Echocardiogram has been performed.  [name] M 12/21/2015, 9:52 AM

## 2015-12-21 NOTE — Interval H&P Note (Signed)
History and Physical Interval Note:  12/21/2015 8:25 AM  [name]  has presented today for surgery, with the diagnosis of vegitation f/u  The various methods of treatment have been discussed with the patient and family. After consideration of risks, benefits and other options for treatment, the patient has consented to  Procedure(s): TRANSESOPHAGEAL ECHOCARDIOGRAM (TEE) (N/A) as a surgical intervention .  The patient's history has been reviewed, patient examined, no change in status, stable for surgery.  I have reviewed the patient's chart and labs.  Questions were answered to the patient's satisfaction.     Dyann Goodspeed S

## 2015-12-21 NOTE — H&P (Signed)
Ref: Delrae Rend, MD   Subjective:  Here for TEE for r/o v egetation  Objective:  Vital Signs in the last 24 hours: Temp:  [97.9 F (36.6 C)] 97.9 F (36.6 C) (06/08 0726) Pulse Rate:  [101-106] 101 (06/08 0805) Cardiac Rhythm:  [-]  Resp:  [14-16] 14 (06/08 0805) BP: (98-120)/(40-57) 120/57 mmHg (06/08 0805) SpO2:  [100 %] 100 % (06/08 0805)  Physical Exam: BP Readings from Last 1 Encounters:  12/21/15 120/57    Wt Readings from Last 1 Encounters:  12/05/15 112.492 kg (248 lb)    Weight change:   HEENT: [place]/AT, Eyes-Brown, PERL, EOMI, Conjunctiva-Pink, Sclera-Non-icteric Neck: No JVD, No bruit, Trachea midline. Lungs:  Clear, Bilateral. Cardiac:  Regular rhythm, normal S1 and S2, no S3.  Abdomen:  Soft, non-tender. Extremities:  No edema present. No cyanosis. No clubbing. CNS: AxOx3, Cranial nerves grossly intact, moves all 4 extremities. Right handed. Skin: Warm and dry.   Intake/Output from previous day:      Lab Results: BMET    Component Value Date/Time   NA 137 09/12/2015 0020   NA 133* 08/19/2015 0807   NA 134* 08/18/2015 2020   NA 135* 02/10/2014 1319   K 4.6 09/12/2015 0020   K 4.3 08/19/2015 0807   K 4.4 08/18/2015 2020   K 4.6 02/17/2014 1353   K 4.6 02/10/2014 1319   CL 94* 09/12/2015 0020   CL 93* 08/19/2015 0807   CL 94* 08/18/2015 2020   CL 99 02/10/2014 1319   CO2 26 09/12/2015 0020   CO2 21* 08/19/2015 0807   CO2 22 08/18/2015 2020   CO2 25 02/10/2014 1319   GLUCOSE 229* 09/12/2015 0020   GLUCOSE 128* 08/19/2015 0807   GLUCOSE 180* 08/18/2015 2020   GLUCOSE 205* 02/10/2014 1319   BUN 40* 09/12/2015 0020   BUN 48* 08/19/2015 0807   BUN 48* 08/18/2015 2020   BUN 40* 02/10/2014 1319   CREATININE 9.39* 09/12/2015 0020   CREATININE 11.36* 08/19/2015 0807   CREATININE 11.05* 08/18/2015 2020   CREATININE 11.58* 02/10/2014 1319   CALCIUM 9.1 09/12/2015 0020   CALCIUM 9.5 08/19/2015 0807   CALCIUM 9.3 08/18/2015 2020   CALCIUM 8.4*  02/10/2014 1319   GFRNONAA 6* 09/12/2015 0020   GFRNONAA 5* 08/19/2015 0807   GFRNONAA 5* 08/18/2015 2020   GFRNONAA 5* 02/10/2014 1319   GFRAA 7* 09/12/2015 0020   GFRAA 5* 08/19/2015 0807   GFRAA 5* 08/18/2015 2020   GFRAA 5* 02/10/2014 1319   CBC    Component Value Date/Time   WBC 7.7 09/12/2015 0020   WBC 8.1 02/10/2014 1319   RBC 3.09* 09/12/2015 0020   RBC 3.86* 02/10/2014 1319   HGB 9.3* 09/12/2015 0020   HGB 11.4* 02/10/2014 1319   HCT 30.3* 09/12/2015 0020   HCT 36.3* 02/10/2014 1319   PLT 240 09/12/2015 0020   PLT 140* 02/10/2014 1319   MCV 98.1 09/12/2015 0020   MCV 94 02/10/2014 1319   MCH 30.1 09/12/2015 0020   MCH 29.4 02/10/2014 1319   MCHC 30.7 09/12/2015 0020   MCHC 31.3* 02/10/2014 1319   RDW 18.1* 09/12/2015 0020   RDW 16.7* 02/10/2014 1319   LYMPHSABS 1.7 08/16/2015 0926   LYMPHSABS 1.8 02/10/2014 1319   MONOABS 1.1* 08/16/2015 0926   MONOABS 0.8 02/10/2014 1319   EOSABS 1.1* 08/16/2015 0926   EOSABS 0.6 02/10/2014 1319   BASOSABS 0.1 08/16/2015 0926   BASOSABS 0.1 02/10/2014 1319   HEPATIC Function Panel  Recent Labs  04/24/15 0806 08/11/15 0928 08/12/15 0331  PROT 6.8 7.6 6.8   HEMOGLOBIN A1C No components found for: HGA1C,  MPG CARDIAC ENZYMES Lab Results  Component Value Date   CKTOTAL 66 08/14/2015   TROPONINI <0.03 09/12/2015   TROPONINI <0.03 09/12/2015   TROPONINI <0.03 09/12/2015   BNP No results for input(s): PROBNP in the last 8760 hours. TSH No results for input(s): TSH in the last 8760 hours. CHOLESTEROL No results for input(s): CHOL in the last 8760 hours.  Scheduled Meds:  Continuous Infusions: . sodium chloride     PRN Meds:.  Assessment/Plan: H/O MRSA bacteremia and endocarditis  TEE today.     Dixie Dials  MD  12/21/2015, 8:22 AM

## 2015-12-21 NOTE — CV Procedure (Signed)
INDICATIONS:   The patient is 50 year old male with MRSA bacteremia and right atrial free wall vegetation 4 months ago.  PROCEDURE:  Informed consent was discussed including risks, benefits and alternatives for the procedure.  Risks include, but are not limited to, cough, sore throat, vomiting, nausea, somnolence, esophageal and stomach trauma or perforation, bleeding, low blood pressure, aspiration, pneumonia, infection, trauma to the teeth and death.    Patient was given sedation.  The oropharynx was anesthetized with topical lidocaine.  The transesophageal probe was inserted in the esophagus and stomach and multiple views were obtained.  Agitated saline was used after the transesophageal probe was removed from the body.  The patient was kept under observation until the patient left the procedure room.  The patient left the procedure room in stable condition.   COMPLICATIONS:  There were no immediate complications.  FINDINGS:  1. LEFT VENTRICLE: The left ventricle is normal in structure and has mild generalized hpokinesia. No thrombus or masses seen in the left ventricle.  2. RIGHT VENTRICLE:  The right ventricle is normal in structure and function without any thrombus or masses.    3. LEFT ATRIUM:  The left atrium is moderately dilated without any thrombus or masses.  4. LEFT ATRIAL APPENDAGE:  The left atrial appendage is free of any thrombus or masses.  5. RIGHT ATRIUM:  The right atrium is moderately dilated free of any thrombus or masses. The right atrial free wall appears thick in its mid portion. Right and left pulmonary veins are unremarkable   6. ATRIAL SEPTUM:  The atrial septum is flail without any ASD or PFO.  7. MITRAL VALVE:  The mitral valve is normal in structure and function with multiple jets of mild to moderate regurgitation, no masses, stenosis or vegetations.  8. TRICUSPID VALVE:  The tricuspid valve is normal in structure and function with moderate regurgitation with  eccentric jet, no masses, stenosis or vegetations.  9. AORTIC VALVE:  The aortic valve is normal in structure and function without regurgitation, masses, stenosis or vegetations.   10. PULMONIC VALVE:  The pulmonic valve is normal in structure and function without regurgitation, masses, stenosis or vegetations. Pulmonary trunk is unremarkable.  11. AORTIC ARCH, ASCENDING AND DESCENDING AORTA:  The aorta had mild atherosclerosis in the ascending or descending aorta.  The aortic arch was normal.  IMPRESSION:   1. 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 2. Dilated LA and RA. 3. Mild LV systolic dysfunction. 4. No ASD or PFO. 5. Probable healed area of prior RA free wall vegetation.  RECOMMENDATIONS:    Medical treatment as needed.

## 2015-12-21 NOTE — Discharge Instructions (Signed)

## 2015-12-22 ENCOUNTER — Other Ambulatory Visit: Payer: Self-pay | Admitting: Vascular Surgery

## 2015-12-22 ENCOUNTER — Encounter (HOSPITAL_COMMUNITY): Payer: Self-pay | Admitting: Cardiovascular Disease

## 2015-12-22 DIAGNOSIS — E8770 Fluid overload, unspecified: Secondary | ICD-10-CM | POA: Diagnosis not present

## 2015-12-22 DIAGNOSIS — E209 Hypoparathyroidism, unspecified: Secondary | ICD-10-CM | POA: Diagnosis not present

## 2015-12-22 DIAGNOSIS — D509 Iron deficiency anemia, unspecified: Secondary | ICD-10-CM | POA: Diagnosis not present

## 2015-12-22 DIAGNOSIS — D631 Anemia in chronic kidney disease: Secondary | ICD-10-CM | POA: Diagnosis not present

## 2015-12-22 DIAGNOSIS — D689 Coagulation defect, unspecified: Secondary | ICD-10-CM | POA: Diagnosis not present

## 2015-12-22 DIAGNOSIS — N186 End stage renal disease: Secondary | ICD-10-CM | POA: Diagnosis not present

## 2015-12-25 DIAGNOSIS — E209 Hypoparathyroidism, unspecified: Secondary | ICD-10-CM | POA: Diagnosis not present

## 2015-12-25 DIAGNOSIS — D631 Anemia in chronic kidney disease: Secondary | ICD-10-CM | POA: Diagnosis not present

## 2015-12-25 DIAGNOSIS — D509 Iron deficiency anemia, unspecified: Secondary | ICD-10-CM | POA: Diagnosis not present

## 2015-12-25 DIAGNOSIS — N186 End stage renal disease: Secondary | ICD-10-CM | POA: Diagnosis not present

## 2015-12-25 DIAGNOSIS — E8770 Fluid overload, unspecified: Secondary | ICD-10-CM | POA: Diagnosis not present

## 2015-12-25 DIAGNOSIS — D689 Coagulation defect, unspecified: Secondary | ICD-10-CM | POA: Diagnosis not present

## 2015-12-27 DIAGNOSIS — D689 Coagulation defect, unspecified: Secondary | ICD-10-CM | POA: Diagnosis not present

## 2015-12-27 DIAGNOSIS — N186 End stage renal disease: Secondary | ICD-10-CM | POA: Diagnosis not present

## 2015-12-27 DIAGNOSIS — D509 Iron deficiency anemia, unspecified: Secondary | ICD-10-CM | POA: Diagnosis not present

## 2015-12-27 DIAGNOSIS — E8770 Fluid overload, unspecified: Secondary | ICD-10-CM | POA: Diagnosis not present

## 2015-12-27 DIAGNOSIS — D631 Anemia in chronic kidney disease: Secondary | ICD-10-CM | POA: Diagnosis not present

## 2015-12-27 DIAGNOSIS — E209 Hypoparathyroidism, unspecified: Secondary | ICD-10-CM | POA: Diagnosis not present

## 2015-12-28 ENCOUNTER — Ambulatory Visit
Admission: RE | Admit: 2015-12-28 | Discharge: 2015-12-28 | Disposition: A | Discharge status: Home / Self Care | Payer: Medicare Other | Source: Ambulatory Visit | Attending: Vascular Surgery | Admitting: Vascular Surgery

## 2015-12-28 ENCOUNTER — Encounter
Admission: RE | Disposition: A | Discharge status: Home / Self Care | Payer: Self-pay | Source: Ambulatory Visit | Attending: Vascular Surgery

## 2015-12-28 DIAGNOSIS — Z01810 Encounter for preprocedural cardiovascular examination: Secondary | ICD-10-CM | POA: Diagnosis not present

## 2015-12-28 DIAGNOSIS — Z992 Dependence on renal dialysis: Secondary | ICD-10-CM | POA: Diagnosis not present

## 2015-12-28 DIAGNOSIS — I120 Hypertensive chronic kidney disease with stage 5 chronic kidney disease or end stage renal disease: Secondary | ICD-10-CM | POA: Insufficient documentation

## 2015-12-28 DIAGNOSIS — N186 End stage renal disease: Secondary | ICD-10-CM | POA: Diagnosis not present

## 2015-12-28 DIAGNOSIS — Z794 Long term (current) use of insulin: Secondary | ICD-10-CM | POA: Diagnosis not present

## 2015-12-28 DIAGNOSIS — F17210 Nicotine dependence, cigarettes, uncomplicated: Secondary | ICD-10-CM | POA: Diagnosis not present

## 2015-12-28 DIAGNOSIS — E1122 Type 2 diabetes mellitus with diabetic chronic kidney disease: Secondary | ICD-10-CM | POA: Insufficient documentation

## 2015-12-28 DIAGNOSIS — K219 Gastro-esophageal reflux disease without esophagitis: Secondary | ICD-10-CM | POA: Diagnosis not present

## 2015-12-28 DIAGNOSIS — Z7982 Long term (current) use of aspirin: Secondary | ICD-10-CM | POA: Insufficient documentation

## 2015-12-28 DIAGNOSIS — E119 Type 2 diabetes mellitus without complications: Secondary | ICD-10-CM | POA: Diagnosis not present

## 2015-12-28 DIAGNOSIS — T82898A Other specified complication of vascular prosthetic devices, implants and grafts, initial encounter: Secondary | ICD-10-CM | POA: Diagnosis not present

## 2015-12-28 DIAGNOSIS — Z89512 Acquired absence of left leg below knee: Secondary | ICD-10-CM | POA: Insufficient documentation

## 2015-12-28 DIAGNOSIS — I498 Other specified cardiac arrhythmias: Secondary | ICD-10-CM | POA: Insufficient documentation

## 2015-12-28 DIAGNOSIS — I871 Compression of vein: Secondary | ICD-10-CM | POA: Diagnosis not present

## 2015-12-28 DIAGNOSIS — Y841 Kidney dialysis as the cause of abnormal reaction of the patient, or of later complication, without mention of misadventure at the time of the procedure: Secondary | ICD-10-CM | POA: Diagnosis not present

## 2015-12-28 DIAGNOSIS — Y832 Surgical operation with anastomosis, bypass or graft as the cause of abnormal reaction of the patient, or of later complication, without mention of misadventure at the time of the procedure: Secondary | ICD-10-CM | POA: Diagnosis not present

## 2015-12-28 DIAGNOSIS — K449 Diaphragmatic hernia without obstruction or gangrene: Secondary | ICD-10-CM | POA: Diagnosis not present

## 2015-12-28 DIAGNOSIS — E213 Hyperparathyroidism, unspecified: Secondary | ICD-10-CM | POA: Insufficient documentation

## 2015-12-28 LAB — GLUCOSE, CAPILLARY: Glucose-Capillary: 213 mg/dL — ABNORMAL HIGH (ref 65–99)

## 2015-12-28 LAB — POTASSIUM (ARMC VASCULAR LAB ONLY): Potassium (ARMC vascular lab): 4.2 (ref 3.5–5.1)

## 2015-12-28 SURGERY — VENOGRAM UPPER EXTREMITY RIGHT
Laterality: Right

## 2015-12-28 MED ORDER — LABETALOL HCL 5 MG/ML IV SOLN: 10.0000 mg | INTRAVENOUS | Status: DC | PRN

## 2015-12-28 MED ORDER — HYDROMORPHONE HCL 1 MG/ML IJ SOLN: 1.0000 mg | Freq: Once | INTRAMUSCULAR | Status: DC

## 2015-12-28 MED ORDER — MORPHINE SULFATE (PF) 4 MG/ML IV SOLN: 2.0000 mg | INTRAVENOUS | Status: DC | PRN

## 2015-12-28 MED ORDER — ONDANSETRON HCL 4 MG/2ML IJ SOLN: 4.0000 mg | Freq: Four times a day (QID) | INTRAMUSCULAR | Status: DC | PRN

## 2015-12-28 MED ORDER — LIDOCAINE-EPINEPHRINE (PF) 1 %-1:200000 IJ SOLN
INTRAMUSCULAR | Status: DC | PRN
  Administered 2015-12-28: 10 mL via INTRADERMAL

## 2015-12-28 MED ORDER — METOPROLOL TARTRATE 5 MG/5ML IV SOLN: 2.0000 mg | INTRAVENOUS | Status: DC | PRN

## 2015-12-28 MED ORDER — MIDAZOLAM HCL 2 MG/2ML IJ SOLN
INTRAMUSCULAR | Status: DC | PRN
  Administered 2015-12-28: 2 mg via INTRAVENOUS

## 2015-12-28 MED ORDER — FAMOTIDINE 20 MG PO TABS: 40.0000 mg | ORAL_TABLET | ORAL | Status: DC | PRN

## 2015-12-28 MED ORDER — HEPARIN SODIUM (PORCINE) 1000 UNIT/ML IJ SOLN
INTRAMUSCULAR | Status: DC | PRN
  Administered 2015-12-28: 4000 [IU] via INTRAVENOUS

## 2015-12-28 MED ORDER — IOPAMIDOL (ISOVUE-300) INJECTION 61%
INTRAVENOUS | Status: DC | PRN
  Administered 2015-12-28: 10 mL via INTRA_ARTERIAL

## 2015-12-28 MED ORDER — ACETAMINOPHEN 325 MG PO TABS: 325.0000 mg | ORAL_TABLET | ORAL | Status: DC | PRN

## 2015-12-28 MED ORDER — METHYLPREDNISOLONE SODIUM SUCC 125 MG IJ SOLR: 125.0000 mg | INTRAMUSCULAR | Status: DC | PRN

## 2015-12-28 MED ORDER — ALUM & MAG HYDROXIDE-SIMETH 200-200-20 MG/5ML PO SUSP: 15.0000 mL | ORAL | Status: DC | PRN

## 2015-12-28 MED ORDER — FENTANYL CITRATE (PF) 100 MCG/2ML IJ SOLN
INTRAMUSCULAR | Status: DC | PRN
  Administered 2015-12-28: 50 ug via INTRAVENOUS

## 2015-12-28 MED ORDER — FENTANYL CITRATE (PF) 100 MCG/2ML IJ SOLN
INTRAMUSCULAR | Status: AC
  Filled 2015-12-28: qty 2

## 2015-12-28 MED ORDER — SODIUM CHLORIDE 0.9 % IV SOLN: 500.0000 mL | Freq: Once | INTRAVENOUS | Status: DC | PRN

## 2015-12-28 MED ORDER — HEPARIN (PORCINE) IN NACL 2-0.9 UNIT/ML-% IJ SOLN
INTRAMUSCULAR | Status: AC
  Filled 2015-12-28: qty 1000

## 2015-12-28 MED ORDER — MIDAZOLAM HCL 5 MG/5ML IJ SOLN
INTRAMUSCULAR | Status: AC
  Filled 2015-12-28: qty 5

## 2015-12-28 MED ORDER — GUAIFENESIN-DM 100-10 MG/5ML PO SYRP: 15.0000 mL | ORAL_SOLUTION | ORAL | Status: DC | PRN

## 2015-12-28 MED ORDER — ACETAMINOPHEN 325 MG RE SUPP: 325.0000 mg | RECTAL | Status: DC | PRN

## 2015-12-28 MED ORDER — DEXTROSE 5 % IV SOLN
1.5000 g | INTRAVENOUS | Status: AC
  Administered 2015-12-28: 1.5 g via INTRAVENOUS

## 2015-12-28 MED ORDER — HYDRALAZINE HCL 20 MG/ML IJ SOLN: 5.0000 mg | INTRAMUSCULAR | Status: DC | PRN

## 2015-12-28 MED ORDER — SODIUM CHLORIDE 0.9 % IV SOLN
INTRAVENOUS | Status: DC
  Administered 2015-12-28 (×2): via INTRAVENOUS

## 2015-12-28 MED ORDER — OXYCODONE-ACETAMINOPHEN 5-325 MG PO TABS: 1.0000 | ORAL_TABLET | ORAL | Status: DC | PRN

## 2015-12-28 MED ORDER — LIDOCAINE-EPINEPHRINE (PF) 1 %-1:200000 IJ SOLN
INTRAMUSCULAR | Status: AC
  Filled 2015-12-28: qty 30

## 2015-12-28 MED ORDER — PHENOL 1.4 % MT LIQD: 1.0000 | OROMUCOSAL | Status: DC | PRN

## 2015-12-28 SURGICAL SUPPLY — 10 items
CANNULA 5F STIFF (CANNULA) ×1 IMPLANT
CATH CXI 4F 90 DAV (MICROCATHETER) ×1 IMPLANT
CATH KA2 5FR 65CM (CATHETERS) ×1 IMPLANT
DRAPE BRACHIAL (DRAPES) ×1 IMPLANT
GLIDECATH 4FR STR (CATHETERS) ×1 IMPLANT
GLIDEWIRE ADV .035X180CM (WIRE) ×1 IMPLANT
GLIDEWIRE STIFF .35X180X3 HYDR (WIRE) ×1 IMPLANT
PACK ANGIOGRAPHY (CUSTOM PROCEDURE TRAY) ×1 IMPLANT
SHEATH BRITE TIP 6FRX5.5 (SHEATH) ×1 IMPLANT
TOWEL OR 17X26 4PK STRL BLUE (TOWEL DISPOSABLE) ×1 IMPLANT

## 2015-12-28 NOTE — H&P (Signed)
  [facility] History & Physical Update  The patient was interviewed and re-examined.  The patient's previous History and Physical has been reviewed and is unchanged.  There is no change in the plan of care. We plan to proceed with the scheduled procedure.  Tyresse Jayson, MD  12/28/2015, 9:08 AM

## 2015-12-28 NOTE — Op Note (Signed)
[facility]   OPERATIVE NOTE  DATE: 02/20/2015  PRE-OPERATIVE DIAGNOSIS: 1. ESRD 2. Difficult dialysis access 3. Central venous occlusion  POST-OPERATIVE DIAGNOSIS: same  PROCEDURE: 1.   Ultrasound guidance for vascular access to right brachial vein 2.   Catheter placement into right innominate vein  3.   Right upper extremity venogram and superior venacavogram   SURGEON: Keyonia Gluth, MD  ASSISTANT(S): None  ANESTHESIA: Local with moderate conscious sedation for 30 minutes with 3 mg of Versed and 100 mcg of Fentanyl  ESTIMATED BLOOD LOSS: minimal  FINDING(S): 1.  Right subclavian vein and innominate vein occluded (including previous stent in both veins) with no reconstitution of the SVC seen  SPECIMEN(S):  None  INDICATIONS:   Patient is a 50 y.o.male who presents with a difficult dialysis access situation. He has a history of central venous occlusions but needs a new dialysis access. Upper extremity venogram are being performed to evaluate the adequacy of the right upper extremities for an AV fistula or AV graft. Risks and benefits were discussed and informed consent was obtained.   DESCRIPTION: After obtaining full informed written consent, the patient was brought back to the vascular suite and placed in supine position with his right arm extended. He received moderate conscious sedation throughout the procedure during a face-to-face encounter with the RN monitoring the vital signs, pulse oximetry, mental status, and telemetry while administering the medications under my direct supervision. After obtaining adequate anesthesia, the patient was prepped and draped in the standard fashion in the right upper extremity.  The right brachial vein was identified with ultrasound and found to be patent. It was then accessed under direct ultrasound guidance without difficulty with a micropuncture needle and a permanent image was recorded. A micropuncture wire and sheath  were then placed. Imaging initially was performed through the micropuncture sheath which showed a paired brachial vein to be patent up beyond a previous axillary vein stent from an old graft but when it joined the subclavian vein and became occluded. There was a stent in the subclavian and innominate vein is occluded. Initial imaging was poor so I upsized to a 6 [place] sheath and used a Kumpe catheter for central imaging. I was able to get a Glidewire and the advantage wire into the occluded subclavian vein and an occluded subclavian and innominate vein stent all the way to the most central portion of the stent in the innominate vein, but could never reentered the superior vena cava. Imaging while within the innominate vein did not show a good superior vena cava target, but I attempted to cross this blindly. After almost 15 minutes fluoroscopy time was clear this was going to be unsuccessful so I elected to terminate the procedure At this point, I elected to terminate the procedure.  sheaths were removed and pressure were held at the access site in the right upper extremity. The patient was awakened from anesthesia and taken to the recovery room in stable condition.  COMPLICATIONS: None  CONDITION: Stable

## 2015-12-29 DIAGNOSIS — D509 Iron deficiency anemia, unspecified: Secondary | ICD-10-CM | POA: Diagnosis not present

## 2015-12-29 DIAGNOSIS — E209 Hypoparathyroidism, unspecified: Secondary | ICD-10-CM | POA: Diagnosis not present

## 2015-12-29 DIAGNOSIS — N186 End stage renal disease: Secondary | ICD-10-CM | POA: Diagnosis not present

## 2015-12-29 DIAGNOSIS — D689 Coagulation defect, unspecified: Secondary | ICD-10-CM | POA: Diagnosis not present

## 2015-12-29 DIAGNOSIS — E8770 Fluid overload, unspecified: Secondary | ICD-10-CM | POA: Diagnosis not present

## 2015-12-29 DIAGNOSIS — D631 Anemia in chronic kidney disease: Secondary | ICD-10-CM | POA: Diagnosis not present

## 2016-01-01 DIAGNOSIS — D509 Iron deficiency anemia, unspecified: Secondary | ICD-10-CM | POA: Diagnosis not present

## 2016-01-01 DIAGNOSIS — E8770 Fluid overload, unspecified: Secondary | ICD-10-CM | POA: Diagnosis not present

## 2016-01-01 DIAGNOSIS — E209 Hypoparathyroidism, unspecified: Secondary | ICD-10-CM | POA: Diagnosis not present

## 2016-01-01 DIAGNOSIS — N186 End stage renal disease: Secondary | ICD-10-CM | POA: Diagnosis not present

## 2016-01-01 DIAGNOSIS — D631 Anemia in chronic kidney disease: Secondary | ICD-10-CM | POA: Diagnosis not present

## 2016-01-01 DIAGNOSIS — D689 Coagulation defect, unspecified: Secondary | ICD-10-CM | POA: Diagnosis not present

## 2016-01-03 DIAGNOSIS — D509 Iron deficiency anemia, unspecified: Secondary | ICD-10-CM | POA: Diagnosis not present

## 2016-01-03 DIAGNOSIS — D631 Anemia in chronic kidney disease: Secondary | ICD-10-CM | POA: Diagnosis not present

## 2016-01-03 DIAGNOSIS — N186 End stage renal disease: Secondary | ICD-10-CM | POA: Diagnosis not present

## 2016-01-03 DIAGNOSIS — E209 Hypoparathyroidism, unspecified: Secondary | ICD-10-CM | POA: Diagnosis not present

## 2016-01-03 DIAGNOSIS — D689 Coagulation defect, unspecified: Secondary | ICD-10-CM | POA: Diagnosis not present

## 2016-01-03 DIAGNOSIS — E8770 Fluid overload, unspecified: Secondary | ICD-10-CM | POA: Diagnosis not present

## 2016-01-04 ENCOUNTER — Encounter (HOSPITAL_BASED_OUTPATIENT_CLINIC_OR_DEPARTMENT_OTHER): Payer: Medicare Other

## 2016-01-04 DIAGNOSIS — D689 Coagulation defect, unspecified: Secondary | ICD-10-CM | POA: Diagnosis not present

## 2016-01-04 DIAGNOSIS — E8770 Fluid overload, unspecified: Secondary | ICD-10-CM | POA: Diagnosis not present

## 2016-01-04 DIAGNOSIS — D631 Anemia in chronic kidney disease: Secondary | ICD-10-CM | POA: Diagnosis not present

## 2016-01-04 DIAGNOSIS — N186 End stage renal disease: Secondary | ICD-10-CM | POA: Diagnosis not present

## 2016-01-04 DIAGNOSIS — D509 Iron deficiency anemia, unspecified: Secondary | ICD-10-CM | POA: Diagnosis not present

## 2016-01-04 DIAGNOSIS — E209 Hypoparathyroidism, unspecified: Secondary | ICD-10-CM | POA: Diagnosis not present

## 2016-01-05 DIAGNOSIS — D631 Anemia in chronic kidney disease: Secondary | ICD-10-CM | POA: Diagnosis not present

## 2016-01-05 DIAGNOSIS — D689 Coagulation defect, unspecified: Secondary | ICD-10-CM | POA: Diagnosis not present

## 2016-01-05 DIAGNOSIS — N186 End stage renal disease: Secondary | ICD-10-CM | POA: Diagnosis not present

## 2016-01-05 DIAGNOSIS — E8770 Fluid overload, unspecified: Secondary | ICD-10-CM | POA: Diagnosis not present

## 2016-01-05 DIAGNOSIS — E209 Hypoparathyroidism, unspecified: Secondary | ICD-10-CM | POA: Diagnosis not present

## 2016-01-05 DIAGNOSIS — D509 Iron deficiency anemia, unspecified: Secondary | ICD-10-CM | POA: Diagnosis not present

## 2016-01-08 DIAGNOSIS — D689 Coagulation defect, unspecified: Secondary | ICD-10-CM | POA: Diagnosis not present

## 2016-01-08 DIAGNOSIS — D509 Iron deficiency anemia, unspecified: Secondary | ICD-10-CM | POA: Diagnosis not present

## 2016-01-08 DIAGNOSIS — N186 End stage renal disease: Secondary | ICD-10-CM | POA: Diagnosis not present

## 2016-01-08 DIAGNOSIS — D631 Anemia in chronic kidney disease: Secondary | ICD-10-CM | POA: Diagnosis not present

## 2016-01-08 DIAGNOSIS — E8770 Fluid overload, unspecified: Secondary | ICD-10-CM | POA: Diagnosis not present

## 2016-01-08 DIAGNOSIS — E209 Hypoparathyroidism, unspecified: Secondary | ICD-10-CM | POA: Diagnosis not present

## 2016-01-08 NOTE — H&P (Signed)
[facility] Admission History & Physical  MRN : [id number]  [name] is a 50 y.o. (08/04/1965) male who presents with chief complaint of No chief complaint on file. Marland Kitchen  History of Present Illness: Patient is a 50 year old male with end-stage renal disease and difficult dialysis access options. He has had multiple failed grafts or fistulas in his upper extremity including a left arm hero graft that had to be removed. He is now catheter dependent and would like to have an upper extremity dialysis access option that is more permanent if possible. He has no other complaints today.  No current facility-administered medications for this encounter.   Current Outpatient Prescriptions  Medication Sig Dispense Refill  . cephALEXin (KEFLEX) 500 MG capsule Take 1 capsule (500 mg total) by mouth 4 (four) times daily. 28 capsule 0  . aspirin EC 81 MG EC tablet Take 1 tablet (81 mg total) by mouth daily. 30 tablet 11  . b complex-vitamin c-folic acid (NEPHRO-VITE) 0.8 MG TABS tablet Take 1 tablet by mouth at bedtime. 30 tablet 0  . calcitRIOL (ROCALTROL) 0.5 MCG capsule Take 5 capsules (2.5 mcg total) by mouth every Monday, Wednesday, and Friday with hemodialysis. 30 capsule 0  . calcium carbonate (TUMS - DOSED IN MG ELEMENTAL CALCIUM) 500 MG chewable tablet Chew 4 tablets (800 mg of elemental calcium total) by mouth 3 (three) times daily with meals.    . famotidine (PEPCID) 20 MG tablet Take 1 tablet (20 mg total) by mouth at bedtime. 30 tablet 0  . insulin detemir (LEVEMIR) 100 UNIT/ML injection Inject 0.2 mLs (20 Units total) into the skin at bedtime. 10 mL 11  . methocarbamol (ROBAXIN) 500 MG tablet Take 1 tablet (500 mg total) by mouth every 6 (six) hours as needed for muscle spasms. 60 tablet 0  . oxyCODONE (OXY IR/ROXICODONE) 5 MG immediate release tablet Take 1-2 tablets (5-10 mg total) by mouth every 4 (four) hours as needed for breakthrough pain. 20 tablet 0  .  vancomycin (VANCOCIN) 1 GM/200ML SOLN Inject 200 mLs (1,000 mg total) into the vein every Monday, Wednesday, and Friday with hemodialysis. Stop Date: 09/27/15 as last dose 4000 mL 0    Past Medical History  Diagnosis Date  . Hypertension   . Chest pain   . Morbid obesity ([place])   . Bronchitis   . Blood transfusion   . H/O hiatal hernia   . Chronic kidney disease     d[facility], [facility]  . GERD (gastroesophageal reflux disease)   . Dysrhythmia     irregular heartbeat  . Hyperparathyroidism ([place])   . ESRD (end stage renal disease) on dialysis ([place])     first HD 06/19/04  . Pneumonia   . Diabetes mellitus     type 2  . Anemia     Past Surgical History  Procedure Laterality Date  . Av fistula placement  10/11/2010  . Ankle fusion      rt foot charcot joint 2008 with refusion  . Av fistula placement  07/02/2011    Procedure: INSERTION OF ARTERIOVENOUS (AV) GORE-TEX GRAFT ARM;  Surgeon: Elam Dutch, MD;  Location: [place];  Service: Vascular;  Laterality: Left;  . Thrombectomy    . Insertion of dialysis catheter  01/20/2012    Procedure: INSERTION OF DIALYSIS CATHETER;  Surgeon: Serafina Mitchell, MD;  Location: [place];  Service: Vascular;  Laterality: N/A;  insertion left internal jugular dialysis catheter 23cm  . Umbilcial  hernia    . Parathyroidectomy    . Avgg removal Right 11/06/2012    Procedure: REMOVAL OF ARTERIOVENOUS GORETEX GRAFT ([place]);  Surgeon: Mal Misty, MD;  Location: [place];  Service: Vascular;  Laterality: Right;  . Av fistula placement Right 11/06/2012    Procedure: INSERTION OF ARTERIOVENOUS (AV) GORE-TEX GRAFT ARM;  Surgeon: Mal Misty, MD;  Location: [facility];  Service: Vascular;  Laterality: Right;  . Insertion of dialysis catheter Right 11/09/2012    Procedure: INSERTION OF DIALYSIS CATHETER;  Surgeon: Conrad Datil, MD;  Location: [employer];  Service: Vascular;  Laterality: Right;  Right  Femoral Vein  . Venogram N/A 01/06/2014    Procedure: VENOGRAM;   Surgeon: Conrad Garfield, MD;  Location: [facility] CATH LAB;  Service: Cardiovascular;  Laterality: N/A;  . Amputation Left 01/20/2015    Procedure: Left Great Toe Amputation;  Surgeon: Newt Minion, MD;  Location: [place];  Service: Orthopedics;  Laterality: Left;  . Amputation Left 03/07/2015    Procedure: Left Foot 1st and 2nd Ray Amputation;  Surgeon: Newt Minion, MD;  Location: [place];  Service: Orthopedics;  Laterality: Left;  . Amputation Left 04/14/2015    Procedure: LEFT BELOW THE KNEE AMPUTATION ;  Surgeon: Newt Minion, MD;  Location: [place];  Service: Orthopedics;  Laterality: Left;  . Removal of a hero device Left 08/14/2015    Procedure: REMOVAL OF LEFT ARM HERO DEVICE;  Surgeon: Rosetta Posner, MD;  Location: [place];  Service: Vascular;  Laterality: Left;  . Insertion of dialysis catheter N/A 08/18/2015    Procedure: INSERTION OF DIALYSIS CATHETER RIGHT FEMORAL VEIN;  Surgeon: Mal Misty, MD;  Location: [place];  Service: Cardiovascular;  Laterality: N/A;  . Tee without cardioversion N/A 08/18/2015    Procedure: TRANSESOPHAGEAL ECHOCARDIOGRAM (TEE);  Surgeon: Mal Misty, MD;  Location: [place];  Service: Cardiovascular;  Laterality: N/A;  . Tee without cardioversion N/A 12/21/2015    Procedure: TRANSESOPHAGEAL ECHOCARDIOGRAM (TEE);  Surgeon: Dixie Dials, MD;  Location: [facility] ENDOSCOPY;  Service: Cardiovascular;  Laterality: N/A;    Social History Social History  Substance Use Topics  . Smoking status: Current Every Day Smoker -- 0.10 packs/day for 20 years    Types: Cigarettes    Last Attempt to Quit: 01/13/2015  . Smokeless tobacco: Never Used  . Alcohol Use: No  No IV drug use  Family History Family History  Problem Relation Age of Onset  . Heart disease Mother   . Kidney disease Mother   . Hypertension Mother   No bleeding or clotting disorders  No Known Allergies   REVIEW OF SYSTEMS (Negative unless checked)  Constitutional: [] Weight loss  [] Fever  [] Chills Cardiac: [] Chest pain    [] Chest pressure   [] Palpitations   [] Shortness of breath when laying flat   [] Shortness of breath at rest   [] Shortness of breath with exertion. Vascular:  [] Pain in legs with walking   [] Pain in legs at rest   [] Pain in legs when laying flat   [] Claudication   [] Pain in feet when walking  [] Pain in feet at rest  [] Pain in feet when laying flat   [] History of DVT   [] Phlebitis   [] Swelling in legs   [] Varicose veins   [] Non-healing ulcers Pulmonary:   [] Uses home oxygen   [] Productive cough   [] Hemoptysis   [] Wheeze  [] COPD   [] Asthma Neurologic:  [] Dizziness  [] Blackouts   [] Seizures   [] History of  stroke   [] History of TIA  [] Aphasia   [] Temporary blindness   [] Dysphagia   [] Weakness or numbness in arms   [] Weakness or numbness in legs Musculoskeletal:  [] Arthritis   [] Joint swelling   [] Joint pain   [] Low back pain Hematologic:  [] Easy bruising  [] Easy bleeding   [] Hypercoagulable state   [] Anemic  [] Hepatitis Gastrointestinal:  [] Blood in stool   [] Vomiting blood  [] Gastroesophageal reflux/heartburn   [] Difficulty swallowing. Genitourinary:  [x] Chronic kidney disease   [] Difficult urination  [] Frequent urination  [] Burning with urination   [] Blood in urine Skin:  [] Rashes   [] Ulcers   [] Wounds Psychological:  [] History of anxiety   []  History of major depression.  Physical Examination  Filed Vitals:   12/28/15 0945 12/28/15 1235 12/28/15 1245 12/28/15 1253  BP: 93/61 82/62 96/52  94/52  Pulse: 106 100    Temp: 98.2 F (36.8 C)     TempSrc: Oral     Resp:  12 12 16   Height: 6\' 1"  (1.854 m)     Weight: 113.399 kg (250 lb)     SpO2: 95% 99% 99% 99%   Body mass index is 32.99 kg/(m^2). Gen: WD/WN, NAD Head: [place]/AT, No temporalis wasting. Prominent temp pulse not noted. Ear/Nose/Throat: Hearing grossly intact, nares w/o erythema or drainage, oropharynx w/o Erythema/Exudate,  Eyes: PERRLA, EOMI.  Neck: Supple, no nuchal rigidity.  No JVD.  Pulmonary:  Good air movement, no use of  accessory muscles.  Cardiac: RRR, normal S1, S2 Vascular: PermCath in place in femoral region Vessel Right Left  Radial Palpable Palpable                                   Gastrointestinal: soft, non-tender/non-distended. No guarding/reflex.  Musculoskeletal: M/S 5/5 throughout.  Extremities without ischemic changes.  No deformity or atrophy.  Neurologic: CN 2-12 intact. Pain and light touch intact in extremities.  Symmetrical.  Speech is fluent. Motor exam as listed above. Psychiatric: Judgment intact, Mood & affect appropriate for pt's clinical situation. Dermatologic: No rashes or ulcers noted.  No cellulitis or open wounds. Lymph : No Cervical, Axillary, or Inguinal lymphadenopathy.      CBC Lab Results  Component Value Date   WBC 7.7 09/12/2015   HGB 9.3* 09/12/2015   HCT 30.3* 09/12/2015   MCV 98.1 09/12/2015   PLT 240 09/12/2015    BMET    Component Value Date/Time   NA 137 09/12/2015 0020   NA 135* 02/10/2014 1319   K 4.6 09/12/2015 0020   K 4.6 02/17/2014 1353   CL 94* 09/12/2015 0020   CL 99 02/10/2014 1319   CO2 26 09/12/2015 0020   CO2 25 02/10/2014 1319   GLUCOSE 229* 09/12/2015 0020   GLUCOSE 205* 02/10/2014 1319   BUN 40* 09/12/2015 0020   BUN 40* 02/10/2014 1319   CREATININE 9.39* 09/12/2015 0020   CREATININE 11.58* 02/10/2014 1319   CALCIUM 9.1 09/12/2015 0020   CALCIUM 8.4* 02/10/2014 1319   GFRNONAA 6* 09/12/2015 0020   GFRNONAA 5* 02/10/2014 1319   GFRAA 7* 09/12/2015 0020   GFRAA 5* 02/10/2014 1319   CrCl cannot be calculated (Patient has no serum creatinine result on file.).  COAG Lab Results  Component Value Date   INR 1.16 04/14/2015   INR 1.15 03/07/2015   INR 1.15 01/20/2015    Radiology No results found.    Assessment/Plan 1. Difficult dialysis access options with failure  of multiple previous grafts and fistulas. He for a venogram to evaluate his right arm and right central venous circulation for future access. 2.  End-stage renal disease. Needs permanent access and not catheter-based therapy if achievable.   DEW,JASON, MD  01/08/2016 9:55 AM

## 2016-01-10 DIAGNOSIS — N186 End stage renal disease: Secondary | ICD-10-CM | POA: Diagnosis not present

## 2016-01-10 DIAGNOSIS — D509 Iron deficiency anemia, unspecified: Secondary | ICD-10-CM | POA: Diagnosis not present

## 2016-01-10 DIAGNOSIS — E8770 Fluid overload, unspecified: Secondary | ICD-10-CM | POA: Diagnosis not present

## 2016-01-10 DIAGNOSIS — E209 Hypoparathyroidism, unspecified: Secondary | ICD-10-CM | POA: Diagnosis not present

## 2016-01-10 DIAGNOSIS — D689 Coagulation defect, unspecified: Secondary | ICD-10-CM | POA: Diagnosis not present

## 2016-01-10 DIAGNOSIS — D631 Anemia in chronic kidney disease: Secondary | ICD-10-CM | POA: Diagnosis not present

## 2016-01-12 DIAGNOSIS — E1129 Type 2 diabetes mellitus with other diabetic kidney complication: Secondary | ICD-10-CM | POA: Diagnosis not present

## 2016-01-12 DIAGNOSIS — N186 End stage renal disease: Secondary | ICD-10-CM | POA: Diagnosis not present

## 2016-01-12 DIAGNOSIS — D509 Iron deficiency anemia, unspecified: Secondary | ICD-10-CM | POA: Diagnosis not present

## 2016-01-12 DIAGNOSIS — D689 Coagulation defect, unspecified: Secondary | ICD-10-CM | POA: Diagnosis not present

## 2016-01-12 DIAGNOSIS — E8770 Fluid overload, unspecified: Secondary | ICD-10-CM | POA: Diagnosis not present

## 2016-01-12 DIAGNOSIS — D631 Anemia in chronic kidney disease: Secondary | ICD-10-CM | POA: Diagnosis not present

## 2016-01-12 DIAGNOSIS — E209 Hypoparathyroidism, unspecified: Secondary | ICD-10-CM | POA: Diagnosis not present

## 2016-01-12 DIAGNOSIS — Z992 Dependence on renal dialysis: Secondary | ICD-10-CM | POA: Diagnosis not present

## 2016-01-15 DIAGNOSIS — E8779 Other fluid overload: Secondary | ICD-10-CM | POA: Diagnosis not present

## 2016-01-15 DIAGNOSIS — D509 Iron deficiency anemia, unspecified: Secondary | ICD-10-CM | POA: Diagnosis not present

## 2016-01-15 DIAGNOSIS — D631 Anemia in chronic kidney disease: Secondary | ICD-10-CM | POA: Diagnosis not present

## 2016-01-15 DIAGNOSIS — E209 Hypoparathyroidism, unspecified: Secondary | ICD-10-CM | POA: Diagnosis not present

## 2016-01-15 DIAGNOSIS — N186 End stage renal disease: Secondary | ICD-10-CM | POA: Diagnosis not present

## 2016-01-18 DIAGNOSIS — I1311 Hypertensive heart and chronic kidney disease without heart failure, with stage 5 chronic kidney disease, or end stage renal disease: Secondary | ICD-10-CM | POA: Diagnosis not present

## 2016-01-18 DIAGNOSIS — I739 Peripheral vascular disease, unspecified: Secondary | ICD-10-CM | POA: Diagnosis not present

## 2016-01-18 DIAGNOSIS — Z992 Dependence on renal dialysis: Secondary | ICD-10-CM | POA: Diagnosis not present

## 2016-01-18 DIAGNOSIS — N186 End stage renal disease: Secondary | ICD-10-CM | POA: Diagnosis not present

## 2016-01-18 DIAGNOSIS — E119 Type 2 diabetes mellitus without complications: Secondary | ICD-10-CM | POA: Diagnosis not present

## 2016-01-18 DIAGNOSIS — D649 Anemia, unspecified: Secondary | ICD-10-CM | POA: Diagnosis not present

## 2016-01-19 DIAGNOSIS — E8779 Other fluid overload: Secondary | ICD-10-CM | POA: Insufficient documentation

## 2016-01-23 ENCOUNTER — Encounter (HOSPITAL_BASED_OUTPATIENT_CLINIC_OR_DEPARTMENT_OTHER): Payer: Medicare Other

## 2016-01-23 DIAGNOSIS — N186 End stage renal disease: Secondary | ICD-10-CM | POA: Diagnosis not present

## 2016-01-23 DIAGNOSIS — Z992 Dependence on renal dialysis: Secondary | ICD-10-CM | POA: Diagnosis not present

## 2016-01-23 DIAGNOSIS — T82898A Other specified complication of vascular prosthetic devices, implants and grafts, initial encounter: Secondary | ICD-10-CM | POA: Diagnosis not present

## 2016-01-23 DIAGNOSIS — Z01810 Encounter for preprocedural cardiovascular examination: Secondary | ICD-10-CM | POA: Diagnosis not present

## 2016-01-23 DIAGNOSIS — Y841 Kidney dialysis as the cause of abnormal reaction of the patient, or of later complication, without mention of misadventure at the time of the procedure: Secondary | ICD-10-CM | POA: Diagnosis not present

## 2016-01-23 DIAGNOSIS — I120 Hypertensive chronic kidney disease with stage 5 chronic kidney disease or end stage renal disease: Secondary | ICD-10-CM | POA: Diagnosis not present

## 2016-01-23 DIAGNOSIS — E119 Type 2 diabetes mellitus without complications: Secondary | ICD-10-CM | POA: Diagnosis not present

## 2016-01-23 DIAGNOSIS — I871 Compression of vein: Secondary | ICD-10-CM | POA: Diagnosis not present

## 2016-01-23 DIAGNOSIS — T8249XA Other complication of vascular dialysis catheter, initial encounter: Secondary | ICD-10-CM | POA: Diagnosis not present

## 2016-02-06 ENCOUNTER — Encounter (HOSPITAL_BASED_OUTPATIENT_CLINIC_OR_DEPARTMENT_OTHER): Payer: Medicare Other | Attending: Surgery

## 2016-02-07 DIAGNOSIS — T8752 Necrosis of amputation stump, left upper extremity: Secondary | ICD-10-CM | POA: Diagnosis not present

## 2016-02-07 DIAGNOSIS — Z89512 Acquired absence of left leg below knee: Secondary | ICD-10-CM | POA: Diagnosis not present

## 2016-02-07 DIAGNOSIS — E1142 Type 2 diabetes mellitus with diabetic polyneuropathy: Secondary | ICD-10-CM | POA: Diagnosis not present

## 2016-02-07 DIAGNOSIS — E1129 Type 2 diabetes mellitus with other diabetic kidney complication: Secondary | ICD-10-CM | POA: Diagnosis not present

## 2016-02-11 ENCOUNTER — Encounter (HOSPITAL_COMMUNITY): Payer: Self-pay

## 2016-02-11 ENCOUNTER — Emergency Department (HOSPITAL_COMMUNITY)
Admission: EM | Admit: 2016-02-11 | Discharge: 2016-02-11 | Disposition: A | Discharge status: Home / Self Care | Payer: Medicare Other | Attending: Emergency Medicine | Admitting: Emergency Medicine

## 2016-02-11 ENCOUNTER — Emergency Department (HOSPITAL_COMMUNITY): Payer: Medicare Other

## 2016-02-11 DIAGNOSIS — R0789 Other chest pain: Secondary | ICD-10-CM | POA: Diagnosis not present

## 2016-02-11 DIAGNOSIS — R079 Chest pain, unspecified: Secondary | ICD-10-CM | POA: Diagnosis not present

## 2016-02-11 DIAGNOSIS — I120 Hypertensive chronic kidney disease with stage 5 chronic kidney disease or end stage renal disease: Secondary | ICD-10-CM | POA: Diagnosis not present

## 2016-02-11 DIAGNOSIS — Z7982 Long term (current) use of aspirin: Secondary | ICD-10-CM | POA: Diagnosis not present

## 2016-02-11 DIAGNOSIS — N186 End stage renal disease: Secondary | ICD-10-CM | POA: Insufficient documentation

## 2016-02-11 DIAGNOSIS — F17210 Nicotine dependence, cigarettes, uncomplicated: Secondary | ICD-10-CM | POA: Diagnosis not present

## 2016-02-11 DIAGNOSIS — Z794 Long term (current) use of insulin: Secondary | ICD-10-CM | POA: Diagnosis not present

## 2016-02-11 DIAGNOSIS — E1122 Type 2 diabetes mellitus with diabetic chronic kidney disease: Secondary | ICD-10-CM | POA: Diagnosis not present

## 2016-02-11 DIAGNOSIS — Z992 Dependence on renal dialysis: Secondary | ICD-10-CM | POA: Diagnosis not present

## 2016-02-11 LAB — I-STAT TROPONIN, ED: Troponin i, poc: 0 ng/mL (ref 0.00–0.08)

## 2016-02-11 LAB — BASIC METABOLIC PANEL
Anion gap: 18 — ABNORMAL HIGH (ref 5–15)
BUN: 60 mg/dL — AB (ref 6–20)
CHLORIDE: 95 mmol/L — AB (ref 101–111)
CO2: 21 mmol/L — AB (ref 22–32)
CREATININE: 13.2 mg/dL — AB (ref 0.61–1.24)
Calcium: 8.8 mg/dL — ABNORMAL LOW (ref 8.9–10.3)
GFR calc Af Amer: 4 mL/min — ABNORMAL LOW (ref >60)
GFR calc non Af Amer: 4 mL/min — ABNORMAL LOW (ref >60)
GLUCOSE: 217 mg/dL — AB (ref 65–99)
Potassium: 4.7 mmol/L (ref 3.5–5.1)
SODIUM: 134 mmol/L — AB (ref 135–145)

## 2016-02-11 LAB — CBC
HEMATOCRIT: 39.8 % (ref 39.0–52.0)
Hemoglobin: 12.4 g/dL — ABNORMAL LOW (ref 13.0–17.0)
MCH: 29.8 pg (ref 26.0–34.0)
MCHC: 31.2 g/dL (ref 30.0–36.0)
MCV: 95.7 fL (ref 78.0–100.0)
PLATELETS: 210 10*3/uL (ref 150–400)
RBC: 4.16 MIL/uL — ABNORMAL LOW (ref 4.22–5.81)
RDW: 15.4 % (ref 11.5–15.5)
WBC: 7.6 10*3/uL (ref 4.0–10.5)

## 2016-02-11 MED ORDER — OXYCODONE-ACETAMINOPHEN 5-325 MG PO TABS: 1.0000 | ORAL_TABLET | Freq: Four times a day (QID) | ORAL | 0 refills | Status: DC | PRN

## 2016-02-11 NOTE — ED Notes (Signed)
Pt transported to xray 

## 2016-02-11 NOTE — ED Provider Notes (Signed)
[facility] DEPT Provider Note   CSN: [id number] Arrival date & time: 02/11/16  1801  First Provider Contact:  First MD Initiated Contact with Patient 02/11/16 [place]        History   Chief Complaint Chief Complaint  Patient presents with  . Chest Pain    HPI  [name] is an 50 y.o. male with history of ESRD (on dialysis MWF schedule), DM, GERD, HTN, h/o bacteremia with endocarditis who presents tot he ED for evaluation of right sided chest pain. He states the pain is in his right side. It waxes and wanes in severity. He states it is not exertional and does not radiate. He states the pain is random and not associated with anything. He states it is minimal in the ED now. Denies shortness of breath or diaphoresis. He states he has not missed any dialysis sessions. Denies abdominal pain, n/v/d, increased leg pain or swelling. Denies fever or chills  Past Medical History:  Diagnosis Date  . Anemia   . Blood transfusion   . Bronchitis   . Chest pain   . Chronic kidney disease    d[facility], [facility]  . Diabetes mellitus    type 2  . Dysrhythmia    irregular heartbeat  . ESRD (end stage renal disease) on dialysis ([place])    first HD 06/19/04  . GERD (gastroesophageal reflux disease)   . H/O hiatal hernia   . Hyperparathyroidism ([place])   . Hypertension   . Morbid obesity ([place])   . Pneumonia     Patient Active Problem List   Diagnosis Date Noted  . Acute chest pain 09/12/2015  . Chest pain 09/12/2015  . Septic shock ([place])   . MRSA bacteremia   . Esophageal reflux   . Infection and inflammatory reaction due to cardiac device, implant, and graft ([place])   . Diabetes mellitus type 2, uncontrolled ([place]) 04/18/2015  . Status post below knee amputation of left lower extremity ([place]) 04/17/2015  . S/P BKA (below knee amputation) unilateral ([place]) 04/14/2015  . 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 ESRD on dialysis  ([place]) 10/23/2012  . DIASTOLIC DYSFUNCTION 0000000  . PVD 10/22/2007  . ESRD 10/22/2007  . HYPERPARATHYROIDISM, HX OF 10/22/2007    Past Surgical History:  Procedure Laterality Date  . AMPUTATION Left 01/20/2015   Procedure: Left Great Toe Amputation;  Surgeon: Newt Minion, MD;  Location: [place];  Service: Orthopedics;  Laterality: Left;  . AMPUTATION Left 03/07/2015   Procedure: Left Foot 1st and 2nd Ray Amputation;  Surgeon: Newt Minion, MD;  Location: [place];  Service: Orthopedics;  Laterality: Left;  . AMPUTATION Left 04/14/2015   Procedure: LEFT BELOW THE KNEE AMPUTATION ;  Surgeon: Newt Minion, MD;  Location: [place];  Service: Orthopedics;  Laterality: Left;  . ANKLE FUSION     rt foot charcot joint 2008 with refusion  . AV FISTULA PLACEMENT  10/11/2010  . AV FISTULA PLACEMENT  07/02/2011   Procedure: INSERTION OF ARTERIOVENOUS (AV) GORE-TEX GRAFT ARM;  Surgeon: Elam Dutch, MD;  Location: [place];  Service: Vascular;  Laterality: Left;  . AV FISTULA PLACEMENT Right 11/06/2012   Procedure: INSERTION OF ARTERIOVENOUS (AV) GORE-TEX GRAFT ARM;  Surgeon: Mal Misty, MD;  Location: [place];  Service: Vascular;  Laterality: Right;  . [place] REMOVAL Right 11/06/2012   Procedure: REMOVAL OF ARTERIOVENOUS GORETEX GRAFT ([place]);  Surgeon: Mal Misty, MD;  [facility];  Service: Vascular;  Laterality: Right;  . INSERTION OF DIALYSIS CATHETER  01/20/2012   Procedure: INSERTION OF DIALYSIS CATHETER;  Surgeon: Serafina Mitchell, MD;  Location: [place];  Service: Vascular;  Laterality: N/A;  insertion left internal jugular dialysis catheter 23cm  . INSERTION OF DIALYSIS CATHETER Right 11/09/2012   Procedure: INSERTION OF DIALYSIS CATHETER;  Surgeon: Conrad [place], MD;  Location: [place];  Service: Vascular;  Laterality: Right;  Right  Femoral Vein  . INSERTION OF DIALYSIS CATHETER N/A 08/18/2015   Procedure: INSERTION OF DIALYSIS CATHETER RIGHT FEMORAL VEIN;  Surgeon: Mal Misty, MD;  Location:  [place];  Service: Cardiovascular;  Laterality: N/A;  . PARATHYROIDECTOMY    . REMOVAL OF A HERO DEVICE Left 08/14/2015   Procedure: REMOVAL OF LEFT ARM HERO DEVICE;  Surgeon: Rosetta Posner, MD;  Location: [place];  Service: Vascular;  Laterality: Left;  . TEE WITHOUT CARDIOVERSION N/A 08/18/2015   Procedure: TRANSESOPHAGEAL ECHOCARDIOGRAM (TEE);  Surgeon: Mal Misty, MD;  Location: [place];  Service: Cardiovascular;  Laterality: N/A;  . TEE WITHOUT CARDIOVERSION N/A 12/21/2015   Procedure: TRANSESOPHAGEAL ECHOCARDIOGRAM (TEE);  Surgeon: Dixie Dials, MD;  Location: [facility] ENDOSCOPY;  Service: Cardiovascular;  Laterality: N/A;  . THROMBECTOMY    . Umbilcial Hernia    . VENOGRAM N/A 01/06/2014   Procedure: VENOGRAM;  Surgeon: Conrad [place], MD;  Location: [facility] CATH LAB;  Service: Cardiovascular;  Laterality: N/A;       Home Medications    Prior to Admission medications   Medication Sig Start Date End Date Taking? Authorizing Provider  aspirin EC 81 MG EC tablet Take 1 tablet (81 mg total) by mouth daily. 08/19/15   Florinda Marker, MD  b complex-vitamin c-folic acid (NEPHRO-VITE) 0.8 MG TABS tablet Take 1 tablet by mouth at bedtime. 04/26/15   Lavon Paganini Angiulli, PA-C  calcitRIOL (ROCALTROL) 0.5 MCG capsule Take 5 capsules (2.5 mcg total) by mouth every Monday, Wednesday, and Friday with hemodialysis. 04/26/15   Lavon Paganini Angiulli, PA-C  calcium carbonate (TUMS - DOSED IN MG ELEMENTAL CALCIUM) 500 MG chewable tablet Chew 4 tablets (800 mg of elemental calcium total) by mouth 3 (three) times daily with meals. 04/26/15   Lavon Paganini Angiulli, PA-C  cephALEXin (KEFLEX) 500 MG capsule Take 1 capsule (500 mg total) by mouth 4 (four) times daily. 12/06/15   Ozella Almond Ward, PA-C  famotidine (PEPCID) 20 MG tablet Take 1 tablet (20 mg total) by mouth at bedtime. 04/26/15   Lavon Paganini Angiulli, PA-C  insulin detemir (LEVEMIR) 100 UNIT/ML injection Inject 0.2 mLs (20 Units total) into the skin at bedtime. 04/26/15   Lavon Paganini  Angiulli, PA-C  methocarbamol (ROBAXIN) 500 MG tablet Take 1 tablet (500 mg total) by mouth every 6 (six) hours as needed for muscle spasms. 04/26/15   Lavon Paganini Angiulli, PA-C  oxyCODONE (OXY IR/ROXICODONE) 5 MG immediate release tablet Take 1-2 tablets (5-10 mg total) by mouth every 4 (four) hours as needed for breakthrough pain. 08/19/15   Florinda Marker, MD  vancomycin (VANCOCIN) 1 GM/200ML SOLN Inject 200 mLs (1,000 mg total) into the vein every Monday, Wednesday, and Friday with hemodialysis. Stop Date: 09/27/15 as last dose 08/19/15   Alexa Angela Burke, MD    Family History Family History  Problem Relation Age of Onset  . Heart disease Mother   . Kidney disease Mother   . Hypertension Mother     Social History Social History  Substance Use Topics  . Smoking status: Current Every Day Smoker    Packs/day: 0.10    Years: 20.00    Types: Cigarettes    Last attempt to quit: 01/13/2015  . Smokeless tobacco: Never Used  . Alcohol use No     Allergies   Review of patient's allergies indicates no known allergies.   Review of Systems Review of Systems  All other systems reviewed and are negative.    Physical Exam Updated Vital Signs BP 139/80 (BP Location: Right Arm)   Pulse 97   Temp 98 F (36.7 C) (Oral)   Resp 20   Ht 6' (1.829 m)   Wt 112.5 kg   SpO2 100%   BMI 33.63 kg/m   Physical Exam  Constitutional: He is oriented to person, place, and time.  Obese, NAD  HENT:  Right Ear: External ear normal.  Left Ear: External ear normal.  Nose: Nose normal.  Mouth/Throat: Oropharynx is clear and moist. No oropharyngeal exudate.  Eyes: Conjunctivae and EOM are normal. Pupils are equal, round, and reactive to light.  Neck: Normal range of motion. Neck supple.  Cardiovascular: Normal rate, regular rhythm, normal heart sounds and intact distal pulses.   Pulmonary/Chest: Effort normal. No respiratory distress. He has no wheezes. He exhibits no tenderness.  Diminished breath sounds  throughout  Abdominal: Soft. Bowel sounds are normal. He exhibits no distension. There is no tenderness. There is no rebound and no guarding.  Musculoskeletal: He exhibits no edema.  L BKA No LE pitting edema  Neurological: He is alert and oriented to person, place, and time. No cranial nerve deficit.  Skin: Skin is warm and dry.  Psychiatric: He has a normal mood and affect.  Nursing note and vitals reviewed.    ED Treatments / Results  Labs (all labs ordered are listed, but only abnormal results are displayed) Labs Reviewed  BASIC METABOLIC PANEL - Abnormal; Notable for the following:       Result Value   Sodium 134 (*)    Chloride 95 (*)    CO2 21 (*)    Glucose, Bld 217 (*)    BUN 60 (*)    Creatinine, Ser 13.20 (*)    Calcium 8.8 (*)    GFR calc non Af Amer 4 (*)    GFR calc Af Amer 4 (*)    Anion gap 18 (*)    All other components within normal limits  CBC - Abnormal; Notable for the following:    RBC 4.16 (*)    Hemoglobin 12.4 (*)    All other components within normal limits  I-STAT TROPOININ, ED    EKG  EKG Interpretation None       Radiology Dg Chest 2 View  Result Date: 02/11/2016 CLINICAL DATA:  Right-sided chest pain EXAM: CHEST  2 VIEW COMPARISON:  09/12/2015 FINDINGS: Dialysis catheter from below with tip at the upper cavoatrial junction. Mildly low volume chest. There is no edema, consolidation, effusion, or pneumothorax. Normal heart size and aortic contours. IMPRESSION: Stable.  No evidence of acute disease. Electronically Signed   By: Monte Fantasia M.D.   On: 02/11/2016 19:57   Procedures Procedures (including critical care time)  Medications Ordered in ED Medications - No data to display   Initial Impression / Assessment and Plan / ED Course  I have reviewed the triage vital signs and the nursing notes.  Pertinent labs & imaging results that were available during my care of the patient were  reviewed by me and considered in my medical  decision making (see chart for details).  Clinical Course    BMP as expected for ESRD patient scheduled for dialysis tomorrow. Troponin negative. EKG nonacute. HEART score 3. Doubt ACS. No SOB or tachycardia. No history of clots. Doubt PE. He does have episodes of chest pain every few months. I discussed there are many etiologies to chest pain including even GERD or musculoskeletal pain. However, given risk factors did encourage close PCP and cards follow up. Pt requesting rx for percocet for his chronic leg pain. Will give short course but encouraged to talk to PCP about further refills. ER return precautions given.  Final Clinical Impressions(s) / ED Diagnoses   Final diagnoses:  Right-sided chest pain    New Prescriptions Discharge Medication List as of 02/11/2016  8:05 PM       Anne Ng, PA-C 02/11/16 2201    Margette Fast, MD 02/12/16 1005

## 2016-02-11 NOTE — ED Triage Notes (Signed)
Pt complaining of intermittent R sided chest pain. Denies any SOB, dizziness. Denies any radiation to arm, shoulder or back.

## 2016-02-12 DIAGNOSIS — Z992 Dependence on renal dialysis: Secondary | ICD-10-CM | POA: Diagnosis not present

## 2016-02-12 DIAGNOSIS — E1129 Type 2 diabetes mellitus with other diabetic kidney complication: Secondary | ICD-10-CM | POA: Diagnosis not present

## 2016-02-12 DIAGNOSIS — N186 End stage renal disease: Secondary | ICD-10-CM | POA: Diagnosis not present

## 2016-02-14 DIAGNOSIS — E119 Type 2 diabetes mellitus without complications: Secondary | ICD-10-CM | POA: Diagnosis not present

## 2016-02-14 DIAGNOSIS — E209 Hypoparathyroidism, unspecified: Secondary | ICD-10-CM | POA: Diagnosis not present

## 2016-02-14 DIAGNOSIS — D631 Anemia in chronic kidney disease: Secondary | ICD-10-CM | POA: Diagnosis not present

## 2016-02-14 DIAGNOSIS — N186 End stage renal disease: Secondary | ICD-10-CM | POA: Diagnosis not present

## 2016-02-14 DIAGNOSIS — D509 Iron deficiency anemia, unspecified: Secondary | ICD-10-CM | POA: Diagnosis not present

## 2016-02-14 DIAGNOSIS — D689 Coagulation defect, unspecified: Secondary | ICD-10-CM | POA: Diagnosis not present

## 2016-02-16 DIAGNOSIS — E119 Type 2 diabetes mellitus without complications: Secondary | ICD-10-CM | POA: Diagnosis not present

## 2016-02-16 DIAGNOSIS — D509 Iron deficiency anemia, unspecified: Secondary | ICD-10-CM | POA: Diagnosis not present

## 2016-02-16 DIAGNOSIS — D631 Anemia in chronic kidney disease: Secondary | ICD-10-CM | POA: Diagnosis not present

## 2016-02-16 DIAGNOSIS — D689 Coagulation defect, unspecified: Secondary | ICD-10-CM | POA: Diagnosis not present

## 2016-02-16 DIAGNOSIS — E209 Hypoparathyroidism, unspecified: Secondary | ICD-10-CM | POA: Diagnosis not present

## 2016-02-16 DIAGNOSIS — N186 End stage renal disease: Secondary | ICD-10-CM | POA: Diagnosis not present

## 2016-02-19 DIAGNOSIS — D509 Iron deficiency anemia, unspecified: Secondary | ICD-10-CM | POA: Diagnosis not present

## 2016-02-19 DIAGNOSIS — E209 Hypoparathyroidism, unspecified: Secondary | ICD-10-CM | POA: Diagnosis not present

## 2016-02-19 DIAGNOSIS — E119 Type 2 diabetes mellitus without complications: Secondary | ICD-10-CM | POA: Diagnosis not present

## 2016-02-19 DIAGNOSIS — D689 Coagulation defect, unspecified: Secondary | ICD-10-CM | POA: Diagnosis not present

## 2016-02-19 DIAGNOSIS — N186 End stage renal disease: Secondary | ICD-10-CM | POA: Diagnosis not present

## 2016-02-19 DIAGNOSIS — D631 Anemia in chronic kidney disease: Secondary | ICD-10-CM | POA: Diagnosis not present

## 2016-02-21 DIAGNOSIS — D689 Coagulation defect, unspecified: Secondary | ICD-10-CM | POA: Diagnosis not present

## 2016-02-21 DIAGNOSIS — D631 Anemia in chronic kidney disease: Secondary | ICD-10-CM | POA: Diagnosis not present

## 2016-02-21 DIAGNOSIS — E119 Type 2 diabetes mellitus without complications: Secondary | ICD-10-CM | POA: Diagnosis not present

## 2016-02-21 DIAGNOSIS — E209 Hypoparathyroidism, unspecified: Secondary | ICD-10-CM | POA: Diagnosis not present

## 2016-02-21 DIAGNOSIS — N186 End stage renal disease: Secondary | ICD-10-CM | POA: Diagnosis not present

## 2016-02-21 DIAGNOSIS — D509 Iron deficiency anemia, unspecified: Secondary | ICD-10-CM | POA: Diagnosis not present

## 2016-02-23 DIAGNOSIS — D631 Anemia in chronic kidney disease: Secondary | ICD-10-CM | POA: Diagnosis not present

## 2016-02-23 DIAGNOSIS — E119 Type 2 diabetes mellitus without complications: Secondary | ICD-10-CM | POA: Diagnosis not present

## 2016-02-23 DIAGNOSIS — E209 Hypoparathyroidism, unspecified: Secondary | ICD-10-CM | POA: Diagnosis not present

## 2016-02-23 DIAGNOSIS — N186 End stage renal disease: Secondary | ICD-10-CM | POA: Diagnosis not present

## 2016-02-23 DIAGNOSIS — D509 Iron deficiency anemia, unspecified: Secondary | ICD-10-CM | POA: Diagnosis not present

## 2016-02-23 DIAGNOSIS — D689 Coagulation defect, unspecified: Secondary | ICD-10-CM | POA: Diagnosis not present

## 2016-02-26 DIAGNOSIS — E209 Hypoparathyroidism, unspecified: Secondary | ICD-10-CM | POA: Diagnosis not present

## 2016-02-26 DIAGNOSIS — E119 Type 2 diabetes mellitus without complications: Secondary | ICD-10-CM | POA: Diagnosis not present

## 2016-02-26 DIAGNOSIS — D509 Iron deficiency anemia, unspecified: Secondary | ICD-10-CM | POA: Diagnosis not present

## 2016-02-26 DIAGNOSIS — D689 Coagulation defect, unspecified: Secondary | ICD-10-CM | POA: Diagnosis not present

## 2016-02-26 DIAGNOSIS — N186 End stage renal disease: Secondary | ICD-10-CM | POA: Diagnosis not present

## 2016-02-26 DIAGNOSIS — D631 Anemia in chronic kidney disease: Secondary | ICD-10-CM | POA: Diagnosis not present

## 2016-02-28 DIAGNOSIS — D631 Anemia in chronic kidney disease: Secondary | ICD-10-CM | POA: Diagnosis not present

## 2016-02-28 DIAGNOSIS — E119 Type 2 diabetes mellitus without complications: Secondary | ICD-10-CM | POA: Diagnosis not present

## 2016-02-28 DIAGNOSIS — D509 Iron deficiency anemia, unspecified: Secondary | ICD-10-CM | POA: Diagnosis not present

## 2016-02-28 DIAGNOSIS — D689 Coagulation defect, unspecified: Secondary | ICD-10-CM | POA: Diagnosis not present

## 2016-02-28 DIAGNOSIS — N186 End stage renal disease: Secondary | ICD-10-CM | POA: Diagnosis not present

## 2016-02-28 DIAGNOSIS — E209 Hypoparathyroidism, unspecified: Secondary | ICD-10-CM | POA: Diagnosis not present

## 2016-03-01 DIAGNOSIS — N186 End stage renal disease: Secondary | ICD-10-CM | POA: Diagnosis not present

## 2016-03-01 DIAGNOSIS — D689 Coagulation defect, unspecified: Secondary | ICD-10-CM | POA: Diagnosis not present

## 2016-03-01 DIAGNOSIS — D631 Anemia in chronic kidney disease: Secondary | ICD-10-CM | POA: Diagnosis not present

## 2016-03-01 DIAGNOSIS — E209 Hypoparathyroidism, unspecified: Secondary | ICD-10-CM | POA: Diagnosis not present

## 2016-03-01 DIAGNOSIS — D509 Iron deficiency anemia, unspecified: Secondary | ICD-10-CM | POA: Diagnosis not present

## 2016-03-01 DIAGNOSIS — E119 Type 2 diabetes mellitus without complications: Secondary | ICD-10-CM | POA: Diagnosis not present

## 2016-03-04 DIAGNOSIS — E209 Hypoparathyroidism, unspecified: Secondary | ICD-10-CM | POA: Diagnosis not present

## 2016-03-04 DIAGNOSIS — D689 Coagulation defect, unspecified: Secondary | ICD-10-CM | POA: Diagnosis not present

## 2016-03-04 DIAGNOSIS — E119 Type 2 diabetes mellitus without complications: Secondary | ICD-10-CM | POA: Diagnosis not present

## 2016-03-04 DIAGNOSIS — N186 End stage renal disease: Secondary | ICD-10-CM | POA: Diagnosis not present

## 2016-03-04 DIAGNOSIS — D509 Iron deficiency anemia, unspecified: Secondary | ICD-10-CM | POA: Diagnosis not present

## 2016-03-04 DIAGNOSIS — D631 Anemia in chronic kidney disease: Secondary | ICD-10-CM | POA: Diagnosis not present

## 2016-03-06 DIAGNOSIS — D689 Coagulation defect, unspecified: Secondary | ICD-10-CM | POA: Diagnosis not present

## 2016-03-06 DIAGNOSIS — D631 Anemia in chronic kidney disease: Secondary | ICD-10-CM | POA: Diagnosis not present

## 2016-03-06 DIAGNOSIS — E119 Type 2 diabetes mellitus without complications: Secondary | ICD-10-CM | POA: Diagnosis not present

## 2016-03-06 DIAGNOSIS — E209 Hypoparathyroidism, unspecified: Secondary | ICD-10-CM | POA: Diagnosis not present

## 2016-03-06 DIAGNOSIS — N186 End stage renal disease: Secondary | ICD-10-CM | POA: Diagnosis not present

## 2016-03-06 DIAGNOSIS — D509 Iron deficiency anemia, unspecified: Secondary | ICD-10-CM | POA: Diagnosis not present

## 2016-03-08 DIAGNOSIS — D631 Anemia in chronic kidney disease: Secondary | ICD-10-CM | POA: Diagnosis not present

## 2016-03-08 DIAGNOSIS — D689 Coagulation defect, unspecified: Secondary | ICD-10-CM | POA: Diagnosis not present

## 2016-03-08 DIAGNOSIS — E209 Hypoparathyroidism, unspecified: Secondary | ICD-10-CM | POA: Diagnosis not present

## 2016-03-08 DIAGNOSIS — I871 Compression of vein: Secondary | ICD-10-CM | POA: Diagnosis not present

## 2016-03-08 DIAGNOSIS — E119 Type 2 diabetes mellitus without complications: Secondary | ICD-10-CM | POA: Diagnosis not present

## 2016-03-08 DIAGNOSIS — T82858D Stenosis of vascular prosthetic devices, implants and grafts, subsequent encounter: Secondary | ICD-10-CM | POA: Diagnosis not present

## 2016-03-08 DIAGNOSIS — N186 End stage renal disease: Secondary | ICD-10-CM | POA: Diagnosis not present

## 2016-03-08 DIAGNOSIS — D509 Iron deficiency anemia, unspecified: Secondary | ICD-10-CM | POA: Diagnosis not present

## 2016-03-08 DIAGNOSIS — Z992 Dependence on renal dialysis: Secondary | ICD-10-CM | POA: Diagnosis not present

## 2016-03-11 DIAGNOSIS — N186 End stage renal disease: Secondary | ICD-10-CM | POA: Diagnosis not present

## 2016-03-11 DIAGNOSIS — E119 Type 2 diabetes mellitus without complications: Secondary | ICD-10-CM | POA: Diagnosis not present

## 2016-03-11 DIAGNOSIS — D689 Coagulation defect, unspecified: Secondary | ICD-10-CM | POA: Diagnosis not present

## 2016-03-11 DIAGNOSIS — D631 Anemia in chronic kidney disease: Secondary | ICD-10-CM | POA: Diagnosis not present

## 2016-03-11 DIAGNOSIS — D509 Iron deficiency anemia, unspecified: Secondary | ICD-10-CM | POA: Diagnosis not present

## 2016-03-11 DIAGNOSIS — E209 Hypoparathyroidism, unspecified: Secondary | ICD-10-CM | POA: Diagnosis not present

## 2016-03-12 ENCOUNTER — Other Ambulatory Visit: Payer: Self-pay | Admitting: Vascular Surgery

## 2016-03-12 DIAGNOSIS — I871 Compression of vein: Secondary | ICD-10-CM | POA: Diagnosis not present

## 2016-03-12 DIAGNOSIS — Y841 Kidney dialysis as the cause of abnormal reaction of the patient, or of later complication, without mention of misadventure at the time of the procedure: Secondary | ICD-10-CM | POA: Diagnosis not present

## 2016-03-12 DIAGNOSIS — I120 Hypertensive chronic kidney disease with stage 5 chronic kidney disease or end stage renal disease: Secondary | ICD-10-CM | POA: Diagnosis not present

## 2016-03-12 DIAGNOSIS — N186 End stage renal disease: Secondary | ICD-10-CM | POA: Diagnosis not present

## 2016-03-12 DIAGNOSIS — T82898A Other specified complication of vascular prosthetic devices, implants and grafts, initial encounter: Secondary | ICD-10-CM | POA: Diagnosis not present

## 2016-03-12 DIAGNOSIS — E119 Type 2 diabetes mellitus without complications: Secondary | ICD-10-CM | POA: Diagnosis not present

## 2016-03-12 DIAGNOSIS — Z01810 Encounter for preprocedural cardiovascular examination: Secondary | ICD-10-CM | POA: Diagnosis not present

## 2016-03-12 DIAGNOSIS — Z992 Dependence on renal dialysis: Secondary | ICD-10-CM | POA: Diagnosis not present

## 2016-03-13 DIAGNOSIS — E119 Type 2 diabetes mellitus without complications: Secondary | ICD-10-CM | POA: Diagnosis not present

## 2016-03-13 DIAGNOSIS — D631 Anemia in chronic kidney disease: Secondary | ICD-10-CM | POA: Diagnosis not present

## 2016-03-13 DIAGNOSIS — N186 End stage renal disease: Secondary | ICD-10-CM | POA: Diagnosis not present

## 2016-03-13 DIAGNOSIS — D689 Coagulation defect, unspecified: Secondary | ICD-10-CM | POA: Diagnosis not present

## 2016-03-13 DIAGNOSIS — D509 Iron deficiency anemia, unspecified: Secondary | ICD-10-CM | POA: Diagnosis not present

## 2016-03-13 DIAGNOSIS — E209 Hypoparathyroidism, unspecified: Secondary | ICD-10-CM | POA: Diagnosis not present

## 2016-03-14 ENCOUNTER — Inpatient Hospital Stay: Admission: RE | Admit: 2016-03-14 | Payer: Medicare Other | Source: Ambulatory Visit

## 2016-03-14 DIAGNOSIS — E1129 Type 2 diabetes mellitus with other diabetic kidney complication: Secondary | ICD-10-CM | POA: Diagnosis not present

## 2016-03-14 DIAGNOSIS — N186 End stage renal disease: Secondary | ICD-10-CM | POA: Diagnosis not present

## 2016-03-14 DIAGNOSIS — Z992 Dependence on renal dialysis: Secondary | ICD-10-CM | POA: Diagnosis not present

## 2016-03-15 DIAGNOSIS — E209 Hypoparathyroidism, unspecified: Secondary | ICD-10-CM | POA: Diagnosis not present

## 2016-03-15 DIAGNOSIS — E119 Type 2 diabetes mellitus without complications: Secondary | ICD-10-CM | POA: Diagnosis not present

## 2016-03-15 DIAGNOSIS — D631 Anemia in chronic kidney disease: Secondary | ICD-10-CM | POA: Diagnosis not present

## 2016-03-15 DIAGNOSIS — D509 Iron deficiency anemia, unspecified: Secondary | ICD-10-CM | POA: Diagnosis not present

## 2016-03-15 DIAGNOSIS — N186 End stage renal disease: Secondary | ICD-10-CM | POA: Diagnosis not present

## 2016-03-19 ENCOUNTER — Encounter
Admission: RE | Admit: 2016-03-19 | Discharge: 2016-03-19 | Disposition: A | Discharge status: Home / Self Care | Payer: Medicare Other | Source: Ambulatory Visit | Attending: Vascular Surgery | Admitting: Vascular Surgery

## 2016-03-19 DIAGNOSIS — E1122 Type 2 diabetes mellitus with diabetic chronic kidney disease: Secondary | ICD-10-CM | POA: Diagnosis not present

## 2016-03-19 DIAGNOSIS — N186 End stage renal disease: Secondary | ICD-10-CM | POA: Diagnosis not present

## 2016-03-19 DIAGNOSIS — Z79899 Other long term (current) drug therapy: Secondary | ICD-10-CM | POA: Diagnosis not present

## 2016-03-19 DIAGNOSIS — I120 Hypertensive chronic kidney disease with stage 5 chronic kidney disease or end stage renal disease: Secondary | ICD-10-CM | POA: Diagnosis not present

## 2016-03-19 DIAGNOSIS — D631 Anemia in chronic kidney disease: Secondary | ICD-10-CM | POA: Diagnosis not present

## 2016-03-19 DIAGNOSIS — Z7982 Long term (current) use of aspirin: Secondary | ICD-10-CM | POA: Diagnosis not present

## 2016-03-19 DIAGNOSIS — F17210 Nicotine dependence, cigarettes, uncomplicated: Secondary | ICD-10-CM | POA: Diagnosis not present

## 2016-03-19 DIAGNOSIS — I739 Peripheral vascular disease, unspecified: Secondary | ICD-10-CM | POA: Diagnosis not present

## 2016-03-19 DIAGNOSIS — K449 Diaphragmatic hernia without obstruction or gangrene: Secondary | ICD-10-CM | POA: Diagnosis not present

## 2016-03-19 DIAGNOSIS — Z794 Long term (current) use of insulin: Secondary | ICD-10-CM | POA: Diagnosis not present

## 2016-03-19 DIAGNOSIS — K219 Gastro-esophageal reflux disease without esophagitis: Secondary | ICD-10-CM | POA: Diagnosis not present

## 2016-03-19 DIAGNOSIS — Z841 Family history of disorders of kidney and ureter: Secondary | ICD-10-CM | POA: Diagnosis not present

## 2016-03-19 DIAGNOSIS — Z86718 Personal history of other venous thrombosis and embolism: Secondary | ICD-10-CM | POA: Diagnosis not present

## 2016-03-19 DIAGNOSIS — Z992 Dependence on renal dialysis: Secondary | ICD-10-CM | POA: Diagnosis not present

## 2016-03-19 LAB — SURGICAL PCR SCREEN
MRSA, PCR: POSITIVE — AB
Staphylococcus aureus: POSITIVE — AB

## 2016-03-19 LAB — TYPE AND SCREEN
ABO/RH(D): A POS
ANTIBODY SCREEN: NEGATIVE

## 2016-03-19 MED ORDER — CHLORHEXIDINE GLUCONATE CLOTH 2 % EX PADS
6.0000 | MEDICATED_PAD | Freq: Once | CUTANEOUS | Status: DC
  Filled 2016-03-19: qty 6

## 2016-03-19 NOTE — Patient Instructions (Signed)
  Your procedure is scheduled on: 03/21/16/ Thurs Report to Same Day Surgery 2nd floor medical mall To find out your arrival time please call [phone] between [place] on 03/20/16 Wed  Remember: Instructions that are not followed completely may result in serious medical risk, up to and including death, or upon the discretion of your surgeon and anesthesiologist your surgery may need to be rescheduled.    _x___ 1. Do not eat food or drink liquids after midnight. No gum chewing or hard candies.     __x__ 2. No Alcohol for 24 hours before or after surgery.   __x__3. No Smoking for 24 prior to surgery.   ____  4. Bring all medications with you on the day of surgery if instructed.    __x__ 5. Notify your doctor if there is any change in your medical condition     (cold, fever, infections).     Do not wear jewelry, make-up, hairpins, clips or nail polish.  Do not wear lotions, powders, or perfumes. You may wear deodorant.  Do not shave 48 hours prior to surgery. Men may shave face and neck.  Do not bring valuables to the hospital.    [facility] is not responsible for any belongings or valuables.               Contacts, dentures or bridgework may not be worn into surgery.  Leave your suitcase in the car. After surgery it may be brought to your room.  For patients admitted to the hospital, discharge time is determined by your treatment team.   Patients discharged the day of surgery will not be allowed to drive home.    Please read over the following fact sheets that you were given:   [facility] Preparing for Surgery and or MRSA Information   _x___ Take these medicines the morning of surgery with A SIP OF WATER:    1. famotidine (PEPCID) 20 MG tablet  2.  3.  4.  5.  6.  ____ Fleet Enema (as directed)   _x___ Use CHG Soap or sage wipes as directed on instruction sheet   ____ Use inhalers on the day of surgery and bring to hospital day of surgery  ____ Stop metformin 2 days  prior to surgery    __x__ Take 1/2 of usual insulin dose the night before surgery and none on the morning of           surgery.   ____ Stop aspirin or coumadin, or plavix  _x__ Stop Anti-inflammatories such as Advil, Aleve, Ibuprofen, Motrin, Naproxen,          Naprosyn, Goodies powders or aspirin products. Ok to take Tylenol.   ____ Stop supplements until after surgery.    ____ Bring C-Pap to the hospital.

## 2016-03-21 ENCOUNTER — Ambulatory Visit
Admission: RE | Admit: 2016-03-21 | Discharge: 2016-03-21 | Disposition: A | Discharge status: Home / Self Care | Payer: Medicare Other | Source: Ambulatory Visit | Attending: Vascular Surgery | Admitting: Vascular Surgery

## 2016-03-21 ENCOUNTER — Encounter
Admission: RE | Disposition: A | Discharge status: Home / Self Care | Payer: Self-pay | Source: Ambulatory Visit | Attending: Vascular Surgery

## 2016-03-21 ENCOUNTER — Encounter: Payer: Self-pay | Admitting: *Deleted

## 2016-03-21 ENCOUNTER — Ambulatory Visit: Payer: Medicare Other | Admitting: Certified Registered Nurse Anesthetist

## 2016-03-21 DIAGNOSIS — K449 Diaphragmatic hernia without obstruction or gangrene: Secondary | ICD-10-CM | POA: Insufficient documentation

## 2016-03-21 DIAGNOSIS — F17210 Nicotine dependence, cigarettes, uncomplicated: Secondary | ICD-10-CM | POA: Insufficient documentation

## 2016-03-21 DIAGNOSIS — Z86718 Personal history of other venous thrombosis and embolism: Secondary | ICD-10-CM | POA: Insufficient documentation

## 2016-03-21 DIAGNOSIS — Z01810 Encounter for preprocedural cardiovascular examination: Secondary | ICD-10-CM | POA: Diagnosis not present

## 2016-03-21 DIAGNOSIS — D631 Anemia in chronic kidney disease: Secondary | ICD-10-CM | POA: Insufficient documentation

## 2016-03-21 DIAGNOSIS — Z794 Long term (current) use of insulin: Secondary | ICD-10-CM | POA: Insufficient documentation

## 2016-03-21 DIAGNOSIS — K219 Gastro-esophageal reflux disease without esophagitis: Secondary | ICD-10-CM | POA: Insufficient documentation

## 2016-03-21 DIAGNOSIS — E119 Type 2 diabetes mellitus without complications: Secondary | ICD-10-CM | POA: Diagnosis not present

## 2016-03-21 DIAGNOSIS — I739 Peripheral vascular disease, unspecified: Secondary | ICD-10-CM | POA: Insufficient documentation

## 2016-03-21 DIAGNOSIS — I120 Hypertensive chronic kidney disease with stage 5 chronic kidney disease or end stage renal disease: Secondary | ICD-10-CM | POA: Insufficient documentation

## 2016-03-21 DIAGNOSIS — E1122 Type 2 diabetes mellitus with diabetic chronic kidney disease: Secondary | ICD-10-CM | POA: Insufficient documentation

## 2016-03-21 DIAGNOSIS — N186 End stage renal disease: Secondary | ICD-10-CM | POA: Diagnosis not present

## 2016-03-21 DIAGNOSIS — D649 Anemia, unspecified: Secondary | ICD-10-CM | POA: Diagnosis not present

## 2016-03-21 DIAGNOSIS — Z7982 Long term (current) use of aspirin: Secondary | ICD-10-CM | POA: Insufficient documentation

## 2016-03-21 DIAGNOSIS — I871 Compression of vein: Secondary | ICD-10-CM | POA: Diagnosis not present

## 2016-03-21 DIAGNOSIS — Z992 Dependence on renal dialysis: Secondary | ICD-10-CM | POA: Insufficient documentation

## 2016-03-21 DIAGNOSIS — T82898A Other specified complication of vascular prosthetic devices, implants and grafts, initial encounter: Secondary | ICD-10-CM | POA: Diagnosis not present

## 2016-03-21 DIAGNOSIS — Z841 Family history of disorders of kidney and ureter: Secondary | ICD-10-CM | POA: Insufficient documentation

## 2016-03-21 DIAGNOSIS — Y841 Kidney dialysis as the cause of abnormal reaction of the patient, or of later complication, without mention of misadventure at the time of the procedure: Secondary | ICD-10-CM | POA: Diagnosis not present

## 2016-03-21 DIAGNOSIS — Z79899 Other long term (current) drug therapy: Secondary | ICD-10-CM | POA: Insufficient documentation

## 2016-03-21 HISTORY — PX: AV FISTULA PLACEMENT: SHX1204

## 2016-03-21 LAB — DIFFERENTIAL
BASOS ABS: 0.1 10*3/uL (ref 0–0.1)
BASOS PCT: 1 %
EOS ABS: 0.4 10*3/uL (ref 0–0.7)
Eosinophils Relative: 5 %
Lymphocytes Relative: 15 %
Lymphs Abs: 1.2 10*3/uL (ref 1.0–3.6)
MONO ABS: 0.9 10*3/uL (ref 0.2–1.0)
MONOS PCT: 11 %
Neutro Abs: 5.5 10*3/uL (ref 1.4–6.5)
Neutrophils Relative %: 68 %

## 2016-03-21 LAB — BASIC METABOLIC PANEL WITH GFR
Anion gap: 16 — ABNORMAL HIGH (ref 5–15)
BUN: 55 mg/dL — ABNORMAL HIGH (ref 6–20)
CO2: 26 mmol/L (ref 22–32)
Calcium: 8.3 mg/dL — ABNORMAL LOW (ref 8.9–10.3)
Chloride: 94 mmol/L — ABNORMAL LOW (ref 101–111)
Creatinine, Ser: 10.79 mg/dL — ABNORMAL HIGH (ref 0.61–1.24)
GFR calc Af Amer: 6 mL/min — ABNORMAL LOW
GFR calc non Af Amer: 5 mL/min — ABNORMAL LOW
Glucose, Bld: 334 mg/dL — ABNORMAL HIGH (ref 65–99)
Potassium: 4.3 mmol/L (ref 3.5–5.1)
Sodium: 136 mmol/L (ref 135–145)

## 2016-03-21 LAB — CBC
HEMATOCRIT: 36 % — AB (ref 40.0–52.0)
Hemoglobin: 12.1 g/dL — ABNORMAL LOW (ref 13.0–18.0)
MCH: 30.2 pg (ref 26.0–34.0)
MCHC: 33.6 g/dL (ref 32.0–36.0)
MCV: 89.7 fL (ref 80.0–100.0)
PLATELETS: 135 10*3/uL — AB (ref 150–440)
RBC: 4.02 MIL/uL — ABNORMAL LOW (ref 4.40–5.90)
RDW: 16.7 % — AB (ref 11.5–14.5)
WBC: 8.1 10*3/uL (ref 3.8–10.6)

## 2016-03-21 LAB — PROTIME-INR
INR: 1.09
Prothrombin Time: 14.1 s (ref 11.4–15.2)

## 2016-03-21 LAB — GLUCOSE, CAPILLARY
Glucose-Capillary: 342 mg/dL — ABNORMAL HIGH (ref 65–99)
Glucose-Capillary: 249 mg/dL — ABNORMAL HIGH (ref 65–99)

## 2016-03-21 LAB — APTT: aPTT: 34 s (ref 24–36)

## 2016-03-21 SURGERY — INSERTION OF ARTERIOVENOUS (AV) GORE-TEX GRAFT ARM
Anesthesia: General | Laterality: Right

## 2016-03-21 MED ORDER — ONDANSETRON HCL 4 MG/2ML IJ SOLN
INTRAMUSCULAR | Status: DC | PRN
  Administered 2016-03-21: 4 mg via INTRAVENOUS

## 2016-03-21 MED ORDER — CEFAZOLIN SODIUM-DEXTROSE 2-4 GM/100ML-% IV SOLN
2.0000 g | INTRAVENOUS | Status: AC
  Administered 2016-03-21: 2 g via INTRAVENOUS

## 2016-03-21 MED ORDER — LIDOCAINE HCL (CARDIAC) 20 MG/ML IV SOLN
INTRAVENOUS | Status: DC | PRN
  Administered 2016-03-21: 100 mg via INTRAVENOUS

## 2016-03-21 MED ORDER — BUPIVACAINE-EPINEPHRINE (PF) 0.5% -1:200000 IJ SOLN
INTRAMUSCULAR | Status: AC
  Filled 2016-03-21: qty 30

## 2016-03-21 MED ORDER — PHENYLEPHRINE HCL 10 MG/ML IJ SOLN
INTRAMUSCULAR | Status: DC | PRN
  Administered 2016-03-21: 200 ug via INTRAVENOUS
  Administered 2016-03-21 (×8): 100 ug via INTRAVENOUS

## 2016-03-21 MED ORDER — ONDANSETRON HCL 4 MG/2ML IJ SOLN: 4.0000 mg | Freq: Once | INTRAMUSCULAR | Status: DC | PRN

## 2016-03-21 MED ORDER — SODIUM CHLORIDE 0.9 % IV SOLN
INTRAVENOUS | Status: DC | PRN
  Administered 2016-03-21: 500 mL via INTRAMUSCULAR

## 2016-03-21 MED ORDER — MIDAZOLAM HCL 2 MG/2ML IJ SOLN
INTRAMUSCULAR | Status: DC | PRN
  Administered 2016-03-21: 2 mg via INTRAVENOUS

## 2016-03-21 MED ORDER — SODIUM CHLORIDE 0.9 % IJ SOLN
INTRAMUSCULAR | Status: AC
  Filled 2016-03-21: qty 100

## 2016-03-21 MED ORDER — OXYCODONE-ACETAMINOPHEN 7.5-325 MG PO TABS
ORAL_TABLET | ORAL | Status: AC
  Administered 2016-03-21: 1 via ORAL
  Filled 2016-03-21: qty 1

## 2016-03-21 MED ORDER — INSULIN ASPART 100 UNIT/ML ~~LOC~~ SOLN: 10.0000 [IU] | Freq: Once | SUBCUTANEOUS | Status: DC

## 2016-03-21 MED ORDER — HEPARIN SODIUM (PORCINE) 1000 UNIT/ML IJ SOLN
INTRAMUSCULAR | Status: DC | PRN
  Administered 2016-03-21: 4000 [IU] via INTRAVENOUS

## 2016-03-21 MED ORDER — FENTANYL CITRATE (PF) 100 MCG/2ML IJ SOLN
INTRAMUSCULAR | Status: DC
Start: 2016-03-21 — End: 2016-03-21
  Filled 2016-03-21: qty 2

## 2016-03-21 MED ORDER — SODIUM CHLORIDE 0.9 % IJ SOLN
INTRAMUSCULAR | Status: AC
  Filled 2016-03-21: qty 20

## 2016-03-21 MED ORDER — HEPARIN SODIUM (PORCINE) 5000 UNIT/ML IJ SOLN
INTRAMUSCULAR | Status: AC
  Filled 2016-03-21: qty 1

## 2016-03-21 MED ORDER — PROPOFOL 10 MG/ML IV BOLUS
INTRAVENOUS | Status: DC | PRN
  Administered 2016-03-21: 50 mg via INTRAVENOUS
  Administered 2016-03-21: 140 mg via INTRAVENOUS
  Administered 2016-03-21: 60 mg via INTRAVENOUS

## 2016-03-21 MED ORDER — SODIUM CHLORIDE 0.9 % IV SOLN
INTRAVENOUS | Status: DC | PRN
  Administered 2016-03-21: 50 ug/min via INTRAVENOUS

## 2016-03-21 MED ORDER — FENTANYL CITRATE (PF) 100 MCG/2ML IJ SOLN
INTRAMUSCULAR | Status: DC | PRN
  Administered 2016-03-21 (×2): 50 ug via INTRAVENOUS
  Administered 2016-03-21 (×4): 25 ug via INTRAVENOUS

## 2016-03-21 MED ORDER — FENTANYL CITRATE (PF) 100 MCG/2ML IJ SOLN
25.0000 ug | INTRAMUSCULAR | Status: DC | PRN
  Administered 2016-03-21 (×2): 25 ug via INTRAVENOUS

## 2016-03-21 MED ORDER — VANCOMYCIN HCL IN DEXTROSE 1-5 GM/200ML-% IV SOLN
1000.0000 mg | Freq: Once | INTRAVENOUS | Status: AC
  Administered 2016-03-21: 1000 mg via INTRAVENOUS
  Filled 2016-03-21: qty 200

## 2016-03-21 MED ORDER — OXYCODONE-ACETAMINOPHEN 7.5-325 MG PO TABS: 1.0000 | ORAL_TABLET | ORAL | 0 refills | Status: DC | PRN

## 2016-03-21 MED ORDER — EVICEL 2 ML EX KIT
PACK | CUTANEOUS | Status: AC
  Filled 2016-03-21: qty 1

## 2016-03-21 MED ORDER — INSULIN ASPART 100 UNIT/ML ~~LOC~~ SOLN
SUBCUTANEOUS | Status: AC
  Filled 2016-03-21: qty 10

## 2016-03-21 MED ORDER — CEFAZOLIN SODIUM-DEXTROSE 2-4 GM/100ML-% IV SOLN
INTRAVENOUS | Status: AC
  Filled 2016-03-21: qty 100

## 2016-03-21 MED ORDER — SODIUM CHLORIDE 0.9 % IV SOLN
INTRAVENOUS | Status: DC
  Administered 2016-03-21: 09:00:00 via INTRAVENOUS

## 2016-03-21 SURGICAL SUPPLY — 61 items
ADH SKN CLS APL DERMABOND .7 (GAUZE/BANDAGES/DRESSINGS) ×1
BAG DECANTER FOR FLEXI CONT (MISCELLANEOUS) ×3 IMPLANT
BLADE SURG SZ11 CARB STEEL (BLADE) ×3 IMPLANT
BOOT SUTURE AID YELLOW STND (SUTURE) ×3 IMPLANT
BRUSH SCRUB 4% CHG (MISCELLANEOUS) ×3 IMPLANT
CANISTER SUCT 1200ML W/VALVE (MISCELLANEOUS) ×3 IMPLANT
CHLORAPREP W/TINT 26ML (MISCELLANEOUS) ×3 IMPLANT
CLIP SPRNG 6 S-JAW DBL (CLIP) ×1 IMPLANT
CLIP SPRNG 6MM S-JAW DBL (CLIP) ×3
DECANTER SPIKE VIAL GLASS SM (MISCELLANEOUS) ×3 IMPLANT
DERMABOND ADVANCED (GAUZE/BANDAGES/DRESSINGS) ×2
DERMABOND ADVANCED .7 DNX12 (GAUZE/BANDAGES/DRESSINGS) IMPLANT
ELECT CAUTERY BLADE 6.4 (BLADE) ×3 IMPLANT
ELECT REM PT RETURN 9FT ADLT (ELECTROSURGICAL) ×3
ELECTRODE REM PT RTRN 9FT ADLT (ELECTROSURGICAL) ×1 IMPLANT
GEL ULTRASOUND 20GR AQUASONIC (MISCELLANEOUS) IMPLANT
GLOVE BIO SURGEON STRL SZ7 (GLOVE) ×6 IMPLANT
GLOVE INDICATOR 7.5 STRL GRN (GLOVE) ×3 IMPLANT
GOWN STRL REUS W/ TWL LRG LVL3 (GOWN DISPOSABLE) ×2 IMPLANT
GOWN STRL REUS W/ TWL XL LVL3 (GOWN DISPOSABLE) ×1 IMPLANT
GOWN STRL REUS W/TWL LRG LVL3 (GOWN DISPOSABLE) ×6
GOWN STRL REUS W/TWL XL LVL3 (GOWN DISPOSABLE) ×3
GRAFT COLLAGEN VASCULAR 7X45 (Vascular Products) ×2 IMPLANT
HEMOSTAT SURGICEL 2X3 (HEMOSTASIS) ×3 IMPLANT
IV NS 500ML (IV SOLUTION) ×3
IV NS 500ML BAXH (IV SOLUTION) ×1 IMPLANT
KIT RM TURNOVER STRD PROC AR (KITS) ×3 IMPLANT
LABEL OR SOLS (LABEL) ×3 IMPLANT
LIQUID BAND (GAUZE/BANDAGES/DRESSINGS) ×3 IMPLANT
LOOP RED MAXI  1X406MM (MISCELLANEOUS) ×2
LOOP VESSEL MAXI 1X406 RED (MISCELLANEOUS) ×1 IMPLANT
LOOP VESSEL MINI 0.8X406 BLUE (MISCELLANEOUS) ×1 IMPLANT
LOOPS BLUE MINI 0.8X406MM (MISCELLANEOUS) ×2
NDL FILTER BLUNT 18X1 1/2 (NEEDLE) ×1 IMPLANT
NDL HYPO 30X.5 LL (NEEDLE) IMPLANT
NEEDLE FILTER BLUNT 18X 1/2SAF (NEEDLE) ×2
NEEDLE FILTER BLUNT 18X1 1/2 (NEEDLE) ×1 IMPLANT
NEEDLE HYPO 30X.5 LL (NEEDLE) IMPLANT
NS IRRIG 500ML POUR BTL (IV SOLUTION) ×3 IMPLANT
PACK EXTREMITY ARMC (MISCELLANEOUS) ×3 IMPLANT
PAD PREP 24X41 OB/GYN DISP (PERSONAL CARE ITEMS) ×3 IMPLANT
SOLUTION CELL SAVER (CLIP) ×1 IMPLANT
SPONGE XRAY 4X4 16PLY STRL (MISCELLANEOUS) ×2 IMPLANT
STOCKINETTE 48X4 2 PLY STRL (GAUZE/BANDAGES/DRESSINGS) ×1 IMPLANT
STOCKINETTE STRL 4IN 9604848 (GAUZE/BANDAGES/DRESSINGS) ×3 IMPLANT
SUT GORETEX CV-6TTC-13 36IN (SUTURE) ×6 IMPLANT
SUT MNCRL AB 4-0 PS2 18 (SUTURE) ×3 IMPLANT
SUT PROLENE 6 0 BV (SUTURE) ×16 IMPLANT
SUT SILK 0 SH 30 (SUTURE) ×3 IMPLANT
SUT SILK 2 0 (SUTURE) ×3
SUT SILK 2-0 18XBRD TIE 12 (SUTURE) ×1 IMPLANT
SUT SILK 3 0 (SUTURE) ×3
SUT SILK 3-0 18XBRD TIE 12 (SUTURE) ×1 IMPLANT
SUT SILK 4 0 (SUTURE) ×3
SUT SILK 4-0 18XBRD TIE 12 (SUTURE) ×1 IMPLANT
SUT VIC AB 3-0 SH 27 (SUTURE) ×3
SUT VIC AB 3-0 SH 27X BRD (SUTURE) ×1 IMPLANT
SYR 20CC LL (SYRINGE) ×3 IMPLANT
SYR 3ML LL SCALE MARK (SYRINGE) ×3 IMPLANT
SYR TB 1ML 27GX1/2 LL (SYRINGE) IMPLANT
TOWEL OR 17X26 4PK STRL BLUE (TOWEL DISPOSABLE) ×3 IMPLANT

## 2016-03-21 NOTE — Anesthesia Postprocedure Evaluation (Signed)
Anesthesia Post Note  Patient: TASHAUN OBEY  Procedure(s) Performed: Procedure(s) (LRB): INSERTION OF ARTERIOVENOUS (AV) GORE-TEX GRAFT ARM ( LOOP FOREARM ) (Right)  Patient location during evaluation: PACU Anesthesia Type: General Level of consciousness: awake and alert Pain management: pain level controlled Vital Signs Assessment: post-procedure vital signs reviewed and stable Respiratory status: spontaneous breathing and respiratory function stable Cardiovascular status: stable Anesthetic complications: no    Last Vitals:  Vitals:   03/21/16 0856 03/21/16 1217  BP: 126/73 (!) 117/50  Pulse: (!) 108 96  Resp: 16 17  Temp: 37.1 C 36.8 C    Last Pain:  Vitals:   03/21/16 0856  TempSrc: Oral                 KEPHART,WILLIAM K

## 2016-03-21 NOTE — Progress Notes (Signed)
Patient called to say he is on his way, approx. 15 minutes Away.

## 2016-03-21 NOTE — H&P (Signed)
  [facility] History & Physical Update  The patient was interviewed and re-examined.  The patient's previous History and Physical has been reviewed and is unchanged.  There is no change in the plan of care. We plan to proceed with the scheduled procedure.  DEW,JASON, MD  03/21/2016, 9:27 AM

## 2016-03-21 NOTE — Progress Notes (Signed)
Right arm warm to touch

## 2016-03-21 NOTE — Discharge Instructions (Signed)

## 2016-03-21 NOTE — Progress Notes (Signed)
Pt  States pain is better after medication

## 2016-03-21 NOTE — Op Note (Signed)
OPERATIVE NOTE   PROCEDURE:  Right brachial artery to brachial vein forearm loop arteriovenous graft with  6 mm Artegraft  PRE-OPERATIVE DIAGNOSIS: 1. ESRD 2. Multiple failed previous accesses  POST-OPERATIVE DIAGNOSIS: same as above  SURGEON: Leotis Pain, MD  ASSISTANT(S): none  ANESTHESIA: general  ESTIMATED BLOOD LOSS: 25 cc  FINDING(S): 1. none  SPECIMEN(S):  none  INDICATIONS:   [name] is a 50 y.o. male who  presents with ESRD and need for permanent dialysis access.  Risk, benefits, and alternatives to access surgery were discussed.  The difference between AV fistulas and AV grafts were discussed.  The patient is aware the risks include but are not limited to: bleeding, infection, steal syndrome, nerve damage, ischemic monomelic neuropathy, failure to mature, and need for additional procedures.  The patient is aware of the risks and elects to proceed forward.  DESCRIPTION: After full informed written consent was obtained from the patient, the patient was brought back to the operating room and placed supine upon the operating table.  The patientwas given IV antibiotics prior to proceeding.  After obtaining adequate general anesthesia, the patient was prepped and draped in standard fashion.    I made a transverse incision at the antecubital fossa and dissected down through the subcutaneous tissue and fascia. The dissection was tedious due to significant scar tissue from previous access surgeries. The brachial artery was dissected proximally and distally and vessel loops placed for control.  The artery was patent and adequate sized for AV graft creation.  One of the brachial veins was in close proximity and was found to be patent and of adequate size for AV graft creation.  I dissected it out and placed a vessel loop around the vein, and would later control this with bulldog clamps.  I then obtained a 6 mm Artegraft graft, which I stretched to full length to help determine  the apex of this looped forearm arteriovenous graft.  I made a transverse incision at the determined apex site and dissected down out a pocket.  I then took a curved metal tunneler and dissected from the antecubital incision up to the apical incision.  I delivered the graft through the metal tunnel, taking care to maintain the orientation of the graft.  I then tunneled from the apex to the antecubital incision, leaving the metal tunnel in place and delivered the remainder of the graft through the tunnel.   I then gave the patient 4000 units of heparin to gain some anticoagulation, and allowed this to circulate for several minutes. I placed the brachial artery under tension proximally and distally with vessel loops.  I made an arteriotomy with a 11 blade and extended it with a Potts scissor for.  The graft was then cut and beveled to match the arteriotomy.  The graft was sewn to the artery with a 6-0 Prolene suture, in a end-to-side configuration.  Prior to completing the anastomosis, I allowed the artery to backbleed from both ends.  I then  released the vessel loops on the inflow and allowed the artery to decompress through the graft. There was good pulsatile bleeding through this graft.  I then clamped the graft near its arterial anastomosis.  I then suctioned out all the blood in the graft and instilled heparinized saline into the graft.   At this point, I pulled the graft to appropriate tension to remove any redundancy.  I then used the bulldog clamps to control the vein.  An anterior wall venotomy  was then made with an 11 blade and extended with Potts scissors.  The graft was then cut an beveled to match the venotomy.  The venous anastomosis was created with a 6-0 Prolene suture. Prior to completing this anastomosis, I allowed the vein to back bleed and then I also allowed the artery to bleed in an antegrade fashion.  I completed this anastomosis in the usual fashion, and irrigated out the wound with sterile  saline.  I released all clamps.  There was excellent flow in the graft, and a palpable pulse in the artery beyond the graft. At this point, I washed out the antecubital wound.  I placed Surgicel and Evicel topical hemostatic agents and hemostasis was complete. The subcutaneous tissue was reapproximated in all incisions with a running stitch of 3-0 Vicryl.  The skin was then reapproximated in all incisions with a running subcuticular 4-0 Monocryl.  The skin was then cleaned and dried at all incisions, and Dermabond used to reinforce the skin closure.  The patient was taken to the recovery room in stable condition having tolerated the procedure well.  COMPLICATIONS: none  CONDITION: stable  [name]  03/21/2016, 12:04 PM

## 2016-03-21 NOTE — Progress Notes (Signed)
Attempted to call patient, no answer 

## 2016-03-21 NOTE — Transfer of Care (Signed)
Immediate Anesthesia Transfer of Care Note  Patient: [name]  Procedure(s) Performed: Procedure(s): INSERTION OF ARTERIOVENOUS (AV) GORE-TEX GRAFT ARM ( LOOP FOREARM ) (Right)  Patient Location: PACU  Anesthesia Type:General  Level of Consciousness: awake and alert   Airway & Oxygen Therapy: Patient Spontanous Breathing and Patient connected to face mask oxygen  Post-op Assessment: Report given to RN and Post -op Vital signs reviewed and stable  Post vital signs: Reviewed and stable  Last Vitals:  Vitals:   03/21/16 0856  BP: 126/73  Pulse: (!) 108  Resp: 16  Temp: 37.1 C    Last Pain:  Vitals:   03/21/16 0856  TempSrc: Oral         Complications: No apparent anesthesia complications

## 2016-03-21 NOTE — Anesthesia Preprocedure Evaluation (Addendum)
Anesthesia Evaluation  Patient identified by MRN, date of birth, ID band Patient awake    Reviewed: Allergy & Precautions, NPO status , Patient's Chart, lab work & pertinent test results  History of Anesthesia Complications Negative for: history of anesthetic complications  Airway Mallampati: III       Dental   Pulmonary neg pulmonary ROS, Current Smoker,           Cardiovascular hypertension, + Peripheral Vascular Disease  + dysrhythmias (occ. tachy)      Neuro/Psych negative neurological ROS     GI/Hepatic hiatal hernia, GERD  Medicated and Controlled,  Endo/Other  diabetes, Type 2, Insulin Dependent  Renal/GU Dialysis and CRFRenal disease     Musculoskeletal   Abdominal   Peds  Hematology  (+) anemia ,   Anesthesia Other Findings   Reproductive/Obstetrics                            Anesthesia Physical Anesthesia Plan  ASA: IV  Anesthesia Plan: General   Post-op Pain Management:    Induction: Intravenous  Airway Management Planned: LMA  Additional Equipment:   Intra-op Plan:   Post-operative Plan:   Informed Consent: I have reviewed the patients History and Physical, chart, labs and discussed the procedure including the risks, benefits and alternatives for the proposed anesthesia with the patient or authorized representative who has indicated his/her understanding and acceptance.     Plan Discussed with:   Anesthesia Plan Comments:       Anesthesia Quick Evaluation

## 2016-03-21 NOTE — Anesthesia Procedure Notes (Signed)
Procedure Name: LMA Insertion Performed by: Anjannette Gauger Pre-anesthesia Checklist: Patient identified, Patient being monitored, Timeout performed, Emergency Drugs available and Suction available Patient Re-evaluated:Patient Re-evaluated prior to inductionOxygen Delivery Method: Circle system utilized Preoxygenation: Pre-oxygenation with 100% oxygen Intubation Type: IV induction Ventilation: Mask ventilation without difficulty LMA: LMA inserted LMA Size: 5.0 Tube type: Oral Number of attempts: 1 Placement Confirmation: positive ETCO2 and breath sounds checked- equal and bilateral Tube secured with: Tape Dental Injury: Teeth and Oropharynx as per pre-operative assessment      

## 2016-03-28 DIAGNOSIS — N186 End stage renal disease: Secondary | ICD-10-CM | POA: Diagnosis not present

## 2016-03-28 DIAGNOSIS — I871 Compression of vein: Secondary | ICD-10-CM | POA: Diagnosis not present

## 2016-03-28 DIAGNOSIS — I120 Hypertensive chronic kidney disease with stage 5 chronic kidney disease or end stage renal disease: Secondary | ICD-10-CM | POA: Diagnosis not present

## 2016-03-28 DIAGNOSIS — Z01810 Encounter for preprocedural cardiovascular examination: Secondary | ICD-10-CM | POA: Diagnosis not present

## 2016-03-28 DIAGNOSIS — E119 Type 2 diabetes mellitus without complications: Secondary | ICD-10-CM | POA: Diagnosis not present

## 2016-03-28 DIAGNOSIS — T82898A Other specified complication of vascular prosthetic devices, implants and grafts, initial encounter: Secondary | ICD-10-CM | POA: Diagnosis not present

## 2016-03-28 DIAGNOSIS — T82318A Breakdown (mechanical) of other vascular grafts, initial encounter: Secondary | ICD-10-CM | POA: Diagnosis not present

## 2016-03-28 DIAGNOSIS — Z992 Dependence on renal dialysis: Secondary | ICD-10-CM | POA: Diagnosis not present

## 2016-03-28 DIAGNOSIS — Y841 Kidney dialysis as the cause of abnormal reaction of the patient, or of later complication, without mention of misadventure at the time of the procedure: Secondary | ICD-10-CM | POA: Diagnosis not present

## 2016-04-02 ENCOUNTER — Other Ambulatory Visit: Payer: Self-pay | Admitting: Vascular Surgery

## 2016-04-03 MED ORDER — DEXTROSE 5 % IV SOLN
1.5000 g | INTRAVENOUS | Status: AC
  Administered 2016-04-04: 1.5 g via INTRAVENOUS

## 2016-04-04 ENCOUNTER — Encounter
Admission: RE | Disposition: A | Discharge status: Home / Self Care | Payer: Self-pay | Source: Ambulatory Visit | Attending: Vascular Surgery

## 2016-04-04 ENCOUNTER — Ambulatory Visit
Admission: RE | Admit: 2016-04-04 | Discharge: 2016-04-04 | Disposition: A | Discharge status: Home / Self Care | Payer: Medicare Other | Source: Ambulatory Visit | Attending: Vascular Surgery | Admitting: Vascular Surgery

## 2016-04-04 DIAGNOSIS — Z841 Family history of disorders of kidney and ureter: Secondary | ICD-10-CM | POA: Diagnosis not present

## 2016-04-04 DIAGNOSIS — I120 Hypertensive chronic kidney disease with stage 5 chronic kidney disease or end stage renal disease: Secondary | ICD-10-CM | POA: Insufficient documentation

## 2016-04-04 DIAGNOSIS — Z992 Dependence on renal dialysis: Secondary | ICD-10-CM | POA: Diagnosis not present

## 2016-04-04 DIAGNOSIS — Y841 Kidney dialysis as the cause of abnormal reaction of the patient, or of later complication, without mention of misadventure at the time of the procedure: Secondary | ICD-10-CM | POA: Diagnosis not present

## 2016-04-04 DIAGNOSIS — Y832 Surgical operation with anastomosis, bypass or graft as the cause of abnormal reaction of the patient, or of later complication, without mention of misadventure at the time of the procedure: Secondary | ICD-10-CM | POA: Insufficient documentation

## 2016-04-04 DIAGNOSIS — F17210 Nicotine dependence, cigarettes, uncomplicated: Secondary | ICD-10-CM | POA: Diagnosis not present

## 2016-04-04 DIAGNOSIS — N186 End stage renal disease: Secondary | ICD-10-CM | POA: Insufficient documentation

## 2016-04-04 DIAGNOSIS — Z86718 Personal history of other venous thrombosis and embolism: Secondary | ICD-10-CM | POA: Insufficient documentation

## 2016-04-04 DIAGNOSIS — Z7982 Long term (current) use of aspirin: Secondary | ICD-10-CM | POA: Insufficient documentation

## 2016-04-04 DIAGNOSIS — T82868A Thrombosis of vascular prosthetic devices, implants and grafts, initial encounter: Secondary | ICD-10-CM | POA: Insufficient documentation

## 2016-04-04 DIAGNOSIS — E119 Type 2 diabetes mellitus without complications: Secondary | ICD-10-CM | POA: Diagnosis not present

## 2016-04-04 DIAGNOSIS — Z794 Long term (current) use of insulin: Secondary | ICD-10-CM | POA: Diagnosis not present

## 2016-04-04 DIAGNOSIS — I871 Compression of vein: Secondary | ICD-10-CM | POA: Diagnosis not present

## 2016-04-04 DIAGNOSIS — Z01810 Encounter for preprocedural cardiovascular examination: Secondary | ICD-10-CM | POA: Diagnosis not present

## 2016-04-04 DIAGNOSIS — E1122 Type 2 diabetes mellitus with diabetic chronic kidney disease: Secondary | ICD-10-CM | POA: Diagnosis not present

## 2016-04-04 DIAGNOSIS — T82898A Other specified complication of vascular prosthetic devices, implants and grafts, initial encounter: Secondary | ICD-10-CM | POA: Diagnosis not present

## 2016-04-04 HISTORY — PX: PERIPHERAL VASCULAR CATHETERIZATION: SHX172C

## 2016-04-04 LAB — POTASSIUM (ARMC VASCULAR LAB ONLY): Potassium (ARMC vascular lab): 4.1 (ref 3.5–5.1)

## 2016-04-04 SURGERY — A/V SHUNTOGRAM/FISTULAGRAM
Anesthesia: Moderate Sedation | Site: Arm Upper | Laterality: Right

## 2016-04-04 MED ORDER — PHENOL 1.4 % MT LIQD: 1.0000 | OROMUCOSAL | Status: DC | PRN

## 2016-04-04 MED ORDER — FENTANYL CITRATE (PF) 100 MCG/2ML IJ SOLN
INTRAMUSCULAR | Status: AC
Start: 2016-04-04 — End: 2016-04-04
  Filled 2016-04-04: qty 2

## 2016-04-04 MED ORDER — FENTANYL CITRATE (PF) 100 MCG/2ML IJ SOLN
INTRAMUSCULAR | Status: AC
  Filled 2016-04-04: qty 2

## 2016-04-04 MED ORDER — ACETAMINOPHEN 325 MG RE SUPP
325.0000 mg | RECTAL | Status: DC | PRN
  Filled 2016-04-04: qty 2

## 2016-04-04 MED ORDER — ONDANSETRON HCL 4 MG/2ML IJ SOLN: 4.0000 mg | Freq: Four times a day (QID) | INTRAMUSCULAR | Status: DC | PRN

## 2016-04-04 MED ORDER — HEPARIN SODIUM (PORCINE) 1000 UNIT/ML IJ SOLN
INTRAMUSCULAR | Status: AC
  Filled 2016-04-04: qty 1

## 2016-04-04 MED ORDER — FENTANYL CITRATE (PF) 100 MCG/2ML IJ SOLN
INTRAMUSCULAR | Status: DC | PRN
  Administered 2016-04-04 (×3): 25 ug via INTRAVENOUS
  Administered 2016-04-04: 50 ug via INTRAVENOUS
  Administered 2016-04-04: 25 ug via INTRAVENOUS
  Administered 2016-04-04: 50 ug via INTRAVENOUS

## 2016-04-04 MED ORDER — OXYCODONE-ACETAMINOPHEN 5-325 MG PO TABS
1.0000 | ORAL_TABLET | Freq: Once | ORAL | Status: AC
  Administered 2016-04-04: 1 via ORAL

## 2016-04-04 MED ORDER — METHYLPREDNISOLONE SODIUM SUCC 125 MG IJ SOLR: 125.0000 mg | INTRAMUSCULAR | Status: DC | PRN

## 2016-04-04 MED ORDER — LABETALOL HCL 5 MG/ML IV SOLN: 10.0000 mg | INTRAVENOUS | Status: DC | PRN

## 2016-04-04 MED ORDER — MIDAZOLAM HCL 2 MG/2ML IJ SOLN
INTRAMUSCULAR | Status: AC
  Filled 2016-04-04: qty 2

## 2016-04-04 MED ORDER — MORPHINE SULFATE (PF) 4 MG/ML IV SOLN: 2.0000 mg | INTRAVENOUS | Status: DC | PRN

## 2016-04-04 MED ORDER — HEPARIN (PORCINE) IN NACL 2-0.9 UNIT/ML-% IJ SOLN
INTRAMUSCULAR | Status: AC
  Filled 2016-04-04: qty 1000

## 2016-04-04 MED ORDER — FAMOTIDINE 20 MG PO TABS: 40.0000 mg | ORAL_TABLET | ORAL | Status: DC | PRN

## 2016-04-04 MED ORDER — HYDROCODONE-ACETAMINOPHEN 5-325 MG PO TABS
ORAL_TABLET | ORAL | Status: AC
  Filled 2016-04-04: qty 1

## 2016-04-04 MED ORDER — GUAIFENESIN-DM 100-10 MG/5ML PO SYRP: 15.0000 mL | ORAL_SOLUTION | ORAL | Status: DC | PRN

## 2016-04-04 MED ORDER — SODIUM CHLORIDE 0.9 % IV SOLN
INTRAVENOUS | Status: DC
  Administered 2016-04-04: 10:00:00 via INTRAVENOUS

## 2016-04-04 MED ORDER — ALUM & MAG HYDROXIDE-SIMETH 200-200-20 MG/5ML PO SUSP: 15.0000 mL | ORAL | Status: DC | PRN

## 2016-04-04 MED ORDER — MIDAZOLAM HCL 2 MG/2ML IJ SOLN
INTRAMUSCULAR | Status: DC | PRN
  Administered 2016-04-04 (×2): 1 mg via INTRAVENOUS
  Administered 2016-04-04: 2 mg via INTRAVENOUS
  Administered 2016-04-04: 1 mg via INTRAVENOUS
  Administered 2016-04-04: 2 mg via INTRAVENOUS

## 2016-04-04 MED ORDER — OXYCODONE-ACETAMINOPHEN 5-325 MG PO TABS
ORAL_TABLET | ORAL | Status: AC
  Filled 2016-04-04: qty 1

## 2016-04-04 MED ORDER — ACETAMINOPHEN 325 MG PO TABS: 325.0000 mg | ORAL_TABLET | ORAL | Status: DC | PRN

## 2016-04-04 MED ORDER — HYDRALAZINE HCL 20 MG/ML IJ SOLN: 5.0000 mg | INTRAMUSCULAR | Status: DC | PRN

## 2016-04-04 MED ORDER — LIDOCAINE-EPINEPHRINE (PF) 1 %-1:200000 IJ SOLN
INTRAMUSCULAR | Status: AC
  Filled 2016-04-04: qty 30

## 2016-04-04 MED ORDER — HEPARIN (PORCINE) IN NACL 2-0.9 UNIT/ML-% IJ SOLN
INTRAMUSCULAR | Status: AC
Start: 2016-04-04 — End: 2016-04-04
  Filled 2016-04-04: qty 1000

## 2016-04-04 MED ORDER — HEPARIN SODIUM (PORCINE) 1000 UNIT/ML IJ SOLN
INTRAMUSCULAR | Status: DC | PRN
  Administered 2016-04-04: 5000 [IU] via INTRAVENOUS

## 2016-04-04 MED ORDER — HYDROMORPHONE HCL 1 MG/ML IJ SOLN: 1.0000 mg | Freq: Once | INTRAMUSCULAR | Status: DC

## 2016-04-04 MED ORDER — METOPROLOL TARTRATE 5 MG/5ML IV SOLN: 2.0000 mg | INTRAVENOUS | Status: DC | PRN

## 2016-04-04 MED ORDER — SODIUM CHLORIDE 0.9 % IV SOLN: 500.0000 mL | Freq: Once | INTRAVENOUS | Status: DC | PRN

## 2016-04-04 MED ORDER — OXYCODONE-ACETAMINOPHEN 5-325 MG PO TABS: 1.0000 | ORAL_TABLET | ORAL | Status: DC | PRN

## 2016-04-04 MED ORDER — MIDAZOLAM HCL 5 MG/5ML IJ SOLN
INTRAMUSCULAR | Status: AC
  Filled 2016-04-04: qty 5

## 2016-04-04 SURGICAL SUPPLY — 24 items
BALLN DORADO 6X40X80 (BALLOONS) ×3
BALLN DORADO 7X40X80 (BALLOONS) ×3
BALLN LUTONIX DCB 6X60X130 (BALLOONS) ×3
BALLN ULTRVRSE 7X150X75 (BALLOONS) ×3
BALLOON DORADO 6X40X80 (BALLOONS) IMPLANT
BALLOON DORADO 7X40X80 (BALLOONS) IMPLANT
BALLOON LUTONIX DCB 6X60X130 (BALLOONS) IMPLANT
BALLOON ULTRVRSE 7X150X75 (BALLOONS) IMPLANT
CANNULA 5F STIFF (CANNULA) ×3 IMPLANT
CATH EMBOLECTOMY 5FR (BALLOONS) ×2 IMPLANT
CATH TORCON 5FR 0.38 (CATHETERS) ×2 IMPLANT
DEVICE PRESTO INFLATION (MISCELLANEOUS) ×2 IMPLANT
DRAPE BRACHIAL (DRAPES) ×3 IMPLANT
PACK ANGIOGRAPHY (CUSTOM PROCEDURE TRAY) ×3 IMPLANT
SET AVX THROMB ULT (MISCELLANEOUS) ×2 IMPLANT
SHEATH BRITE TIP 6FRX5.5 (SHEATH) ×3 IMPLANT
SHEATH BRITE TIP 7FRX5.5 (SHEATH) ×2 IMPLANT
STENT VIABAHN 6X25X120 (Permanent Stent) ×2 IMPLANT
STENT VIABAHN 7X150X120 (Permanent Stent) ×2 IMPLANT
STENT VIABAHN 8X50X120 (Permanent Stent) ×2 IMPLANT
STENT VIABAHN5X120X8X (Permanent Stent) ×1 IMPLANT
TOWEL OR 17X26 4PK STRL BLUE (TOWEL DISPOSABLE) ×3 IMPLANT
WIRE G 018X200 V18 (WIRE) ×2 IMPLANT
WIRE MAGIC TOR.035 180C (WIRE) ×2 IMPLANT

## 2016-04-04 NOTE — Op Note (Signed)
[name]    OPERATIVE NOTE   PROCEDURE: 1.  Right brachial artery to brachial vein arteriovenous graft cannulation under ultrasound guidance in both a retrograde and then antegrade fashion crossing 2.  Right arm shuntogram and central venogram 3.  Catheter directed thrombolysis with 4 mg of TPA delivered with the AngioJet AVX catheter 4.  Mechanical rheolytic thrombectomy to the right arm brachial artery to brachial vein AV graft and brachial vein with the AngioJet AVX catheter 5.  Fogarty embolectomy for residual arterial plug 6.  Percutaneous transluminal angioplasty of arterial anastomosis with 6 mm diameter Lutonix drug coated balloon 7.  Percutaneous transluminal angioplasty of the venous anastomosis and brachial vein with 6 mm diameter angioplasty balloon 8.  Viabahn covered stent placement to the graft just beyond the arterial anastomosis for residual arterial plug after above procedures using 6 mm diameter by 2.5 cm length stent 9.  Viabahn covered stent placement to the mid graft for a kink and narrowing using an 8 mm diameter by 5 cm length stent 10. Viabahn covered stent placement to the venous anastomosis and brachial vein using a 7 mm diameter by 15 cm length stent  PRE-OPERATIVE DIAGNOSIS: 1. ESRD 2.  Thrombosed right brachial artery to brachial vein arteriovenous graft  POST-OPERATIVE DIAGNOSIS: same as above   SURGEON: Leotis Pain, MD  ANESTHESIA: local with Moderate Conscious Sedation for approximately 60 minutes using 7 mg of Versed and 200 mcg of Fentanyl  ESTIMATED BLOOD LOSS: 25 cc  FINDING(S): 1. Thrombosed graft  SPECIMEN(S):  None  CONTRAST: 70 cc  FLUORO TIME:  13 minutes  INDICATIONS: Patient is a 50 y.o.male who presents with a thrombosed right brachial artery to brachial vein arteriovenous graft.  The patient is scheduled for an attempted declot and shuntogram.  The patient is aware the risks include but are not limited to:  bleeding, infection, thrombosis of the cannulated access, and possible anaphylactic reaction to the contrast.  The patient is aware of the risks of the procedure and elects to proceed forward.  DESCRIPTION: After full informed written consent was obtained, the patient was brought back to the angiography suite and placed supine upon the angiography table.  The patient was connected to monitoring equipment. Moderate conscious sedation was administered with a face to face encounter with the patient throughout the procedure with my supervision of the RN administering medicines and monitoring the patient's vital signs, pulse oximetry, telemetry and mental status throughout from the start of the procedure until the patient was taken to the recovery room. The right arm arm was prepped and draped in the standard fashion for a percutaneous access intervention.  Under ultrasound guidance, the right brachial artery to brachial vein arteriovenous graft was cannulated with a micropuncture needle under direct ultrasound guidance due to the pulseless nature of the graft in both an antegrade and a retrograde fashion crossing, and permanent images were performed.  The microwire was advanced and the needle was exchanged for the a microsheath.  I then upsized to a 6 Fr Sheath and imaging was performed.  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 This demonstrated no flow within the AV graft.  Based on the images, this patient will need extensive treatment to salvage the graft. I then gave the patient 5000 units of intravenous heparin.  I then placed a Magic torque wire into the brachial artery from the retrograde sheath and into  the brachial vein from the antegrade sheath. 4 mg of TPA were deployed throughout the entirety of the graft and into the brachial vein. This was allowed to dwell for 10-15 minutes. Mechanical rheolytic  thrombectomy was then performed throughout the graft and into the brachial vein. This uncovered a high grade stenosis at the venous anastomosis as well as a narrowing at the apex of about 50-60%.  A residual arterial plug was also seen at the arterial anastomosis. An attempt to clear the arterial plug was done with six passes of the Fogarty embolectomy balloon. Flow-limiting arterial plug remained, and I elected to treat this lesion with a 6 mm diameter Lutonix drug coated angioplasty balloon. Despite all of this, a residual arterial plug remained.  I exchanged for a 0.018 wire and covered this area with a 6 mm x 2.5 cm Viabahn covered stent.  This was post-dilated with a 7 mm balloon with excellent result and no residual stenosis. This resulted in resolution of the arterial plug, and clearance of the arterial side of the graft. The arterial outflow was seen to be intact through the radial and ulnar arteries as well on these images. The retrograde sheath was removed. I then turned my attention to the thrombus in the distal graft and the brachial vein. Mechanical rheolytic thrombectomy was performed. This resulted in mild improvement, but the venous anastomosis remained highly stenotic with thrombus present.  I then elected to treat this with a 6 mm balloon inflated to 14 atm for one minute.  >50% stenosis remained, and I covered this area of the venous anastomosis and about 5 to 6 cm into the brachial vein with a 7 mm x 15 cm length Viabahn stent.  This was post-dilated with a 7 mm balloon.  I also elected to place an 8 mm x 5 cm length Viabahn stent at the apex to take care of the narrowing and kink at that area.  This was postdilated with a 7 mm balloon with excellent angiographic completion result and brisk flow seen through the graft.    Based on the completion imaging, no further intervention is necessary.  The wire and balloon were removed from the sheath.  A 4-0 Monocryl purse-string suture was sewn around  the sheath.  The sheath was removed while tying down the suture.  A sterile bandage was applied to the puncture site.  COMPLICATIONS: None  CONDITION: Stable   [name] 04/04/2016 12:46 PM

## 2016-04-04 NOTE — H&P (Signed)
[facility] History & Physical Update  The patient was interviewed and re-examined.  The patient's previous History and Physical has been reviewed and is unchanged.  There is no change in the plan of care. We plan to proceed with the scheduled procedure.  Jorita Bohanon, MD  04/04/2016, 10:45 AM

## 2016-04-05 ENCOUNTER — Encounter: Payer: Self-pay | Admitting: Vascular Surgery

## 2016-04-08 DIAGNOSIS — M25571 Pain in right ankle and joints of right foot: Secondary | ICD-10-CM | POA: Diagnosis not present

## 2016-04-08 DIAGNOSIS — G8929 Other chronic pain: Secondary | ICD-10-CM | POA: Diagnosis not present

## 2016-04-13 DIAGNOSIS — E1129 Type 2 diabetes mellitus with other diabetic kidney complication: Secondary | ICD-10-CM | POA: Diagnosis not present

## 2016-04-13 DIAGNOSIS — N186 End stage renal disease: Secondary | ICD-10-CM | POA: Diagnosis not present

## 2016-04-13 DIAGNOSIS — Z992 Dependence on renal dialysis: Secondary | ICD-10-CM | POA: Diagnosis not present

## 2016-04-15 DIAGNOSIS — E209 Hypoparathyroidism, unspecified: Secondary | ICD-10-CM | POA: Diagnosis not present

## 2016-04-15 DIAGNOSIS — N186 End stage renal disease: Secondary | ICD-10-CM | POA: Diagnosis not present

## 2016-04-15 DIAGNOSIS — D509 Iron deficiency anemia, unspecified: Secondary | ICD-10-CM | POA: Diagnosis not present

## 2016-04-15 DIAGNOSIS — D631 Anemia in chronic kidney disease: Secondary | ICD-10-CM | POA: Diagnosis not present

## 2016-04-22 ENCOUNTER — Encounter: Payer: Self-pay | Admitting: Vascular Surgery

## 2016-04-23 ENCOUNTER — Ambulatory Visit (INDEPENDENT_AMBULATORY_CARE_PROVIDER_SITE_OTHER): Payer: Medicare Other | Admitting: Orthopedic Surgery

## 2016-04-25 ENCOUNTER — Telehealth (INDEPENDENT_AMBULATORY_CARE_PROVIDER_SITE_OTHER): Payer: Self-pay

## 2016-04-25 NOTE — Telephone Encounter (Signed)
Called the patient and left a message on his personal voice mail regarding what Dr. Lucky Cowboy recommended. See note below.

## 2016-04-25 NOTE — Telephone Encounter (Signed)
Patient called I follow up with the instructions

## 2016-04-25 NOTE — Telephone Encounter (Signed)
Shuntogram I guess.  Elevate and can use an ace wrap to help the swelling.  He has a central vein occlusion so likely to remain swollen

## 2016-04-25 NOTE — Telephone Encounter (Signed)
Patient called stating his right arm is whelping up with red spots and swollen. He had a right brachial artery to brachial vein forearm loop arteriovenous graft on 03/21/16, what to do ?

## 2016-05-07 DIAGNOSIS — M25571 Pain in right ankle and joints of right foot: Secondary | ICD-10-CM | POA: Diagnosis not present

## 2016-05-07 DIAGNOSIS — G8929 Other chronic pain: Secondary | ICD-10-CM | POA: Diagnosis not present

## 2016-05-08 DIAGNOSIS — E1129 Type 2 diabetes mellitus with other diabetic kidney complication: Secondary | ICD-10-CM | POA: Diagnosis not present

## 2016-05-14 DIAGNOSIS — Z992 Dependence on renal dialysis: Secondary | ICD-10-CM | POA: Diagnosis not present

## 2016-05-14 DIAGNOSIS — E1129 Type 2 diabetes mellitus with other diabetic kidney complication: Secondary | ICD-10-CM | POA: Diagnosis not present

## 2016-05-14 DIAGNOSIS — N186 End stage renal disease: Secondary | ICD-10-CM | POA: Diagnosis not present

## 2016-05-15 DIAGNOSIS — E119 Type 2 diabetes mellitus without complications: Secondary | ICD-10-CM | POA: Diagnosis not present

## 2016-05-15 DIAGNOSIS — N186 End stage renal disease: Secondary | ICD-10-CM | POA: Diagnosis not present

## 2016-05-15 DIAGNOSIS — D631 Anemia in chronic kidney disease: Secondary | ICD-10-CM | POA: Diagnosis not present

## 2016-05-15 DIAGNOSIS — D509 Iron deficiency anemia, unspecified: Secondary | ICD-10-CM | POA: Diagnosis not present

## 2016-05-15 DIAGNOSIS — E209 Hypoparathyroidism, unspecified: Secondary | ICD-10-CM | POA: Diagnosis not present

## 2016-05-17 ENCOUNTER — Telehealth (INDEPENDENT_AMBULATORY_CARE_PROVIDER_SITE_OTHER): Payer: Self-pay

## 2016-05-17 DIAGNOSIS — D509 Iron deficiency anemia, unspecified: Secondary | ICD-10-CM | POA: Diagnosis not present

## 2016-05-17 DIAGNOSIS — D631 Anemia in chronic kidney disease: Secondary | ICD-10-CM | POA: Diagnosis not present

## 2016-05-17 DIAGNOSIS — E209 Hypoparathyroidism, unspecified: Secondary | ICD-10-CM | POA: Diagnosis not present

## 2016-05-17 DIAGNOSIS — N186 End stage renal disease: Secondary | ICD-10-CM | POA: Diagnosis not present

## 2016-05-17 DIAGNOSIS — Z992 Dependence on renal dialysis: Secondary | ICD-10-CM | POA: Diagnosis not present

## 2016-05-17 DIAGNOSIS — I871 Compression of vein: Secondary | ICD-10-CM | POA: Diagnosis not present

## 2016-05-17 DIAGNOSIS — T82858D Stenosis of vascular prosthetic devices, implants and grafts, subsequent encounter: Secondary | ICD-10-CM | POA: Diagnosis not present

## 2016-05-17 DIAGNOSIS — E119 Type 2 diabetes mellitus without complications: Secondary | ICD-10-CM | POA: Diagnosis not present

## 2016-05-17 NOTE — Telephone Encounter (Signed)
05/15/16 Deborah from [facility] called regarding the patient. She stated the patient had a clotted access which was newly placed 03/21/16, I asked if the patient had another way to dialysis and she stated he did. Per Dr. Lucky Cowboy he wanted them to to TPA infusion and if that did not work for them to call back, I did offer to put him on the schedule for Thursday. I let Neoma Laming know this and she was to call if that did not work.

## 2016-05-20 DIAGNOSIS — E209 Hypoparathyroidism, unspecified: Secondary | ICD-10-CM | POA: Diagnosis not present

## 2016-05-20 DIAGNOSIS — E119 Type 2 diabetes mellitus without complications: Secondary | ICD-10-CM | POA: Diagnosis not present

## 2016-05-20 DIAGNOSIS — D631 Anemia in chronic kidney disease: Secondary | ICD-10-CM | POA: Diagnosis not present

## 2016-05-20 DIAGNOSIS — D509 Iron deficiency anemia, unspecified: Secondary | ICD-10-CM | POA: Diagnosis not present

## 2016-05-20 DIAGNOSIS — N186 End stage renal disease: Secondary | ICD-10-CM | POA: Diagnosis not present

## 2016-05-22 DIAGNOSIS — D509 Iron deficiency anemia, unspecified: Secondary | ICD-10-CM | POA: Diagnosis not present

## 2016-05-22 DIAGNOSIS — N186 End stage renal disease: Secondary | ICD-10-CM | POA: Diagnosis not present

## 2016-05-22 DIAGNOSIS — E209 Hypoparathyroidism, unspecified: Secondary | ICD-10-CM | POA: Diagnosis not present

## 2016-05-22 DIAGNOSIS — E119 Type 2 diabetes mellitus without complications: Secondary | ICD-10-CM | POA: Diagnosis not present

## 2016-05-22 DIAGNOSIS — D631 Anemia in chronic kidney disease: Secondary | ICD-10-CM | POA: Diagnosis not present

## 2016-05-23 ENCOUNTER — Encounter (INDEPENDENT_AMBULATORY_CARE_PROVIDER_SITE_OTHER): Payer: Self-pay | Admitting: Orthopedic Surgery

## 2016-05-23 ENCOUNTER — Ambulatory Visit (INDEPENDENT_AMBULATORY_CARE_PROVIDER_SITE_OTHER): Payer: Medicare Other | Admitting: Family

## 2016-05-23 VITALS — Ht 72.0 in | Wt 260.0 lb

## 2016-05-23 DIAGNOSIS — Z89512 Acquired absence of left leg below knee: Secondary | ICD-10-CM | POA: Diagnosis not present

## 2016-05-23 DIAGNOSIS — Z981 Arthrodesis status: Secondary | ICD-10-CM | POA: Diagnosis not present

## 2016-05-23 NOTE — Progress Notes (Signed)
Office Visit Note   Patient: [name]           Date of Birth: 31-Dec-1965           MRN: [id number] Visit Date: 05/23/2016              Requested by: Delrae Rend, MD [address] [place],  [address] PCP: Delrae Rend, MD   Assessment & Plan: Visit Diagnoses: No diagnosis found.  Plan: I discussed pain management with the patient at length. He will continue with his current pain management clinic in [facility] until arrangements are made for pain management clinic here in [place]. Discussed with the patient that we cannot be providing his narcotics long-term. I provided him with an order for him a new double upright brace for the right foot, Custom orthotics and extra-depth shoes. Evaluation for new socket on the left. Does need a new liner his current one is worn out.  Follow-Up Instructions: No Follow-up on file.   Orders:  No orders of the defined types were placed in this encounter.  No orders of the defined types were placed in this encounter.     Procedures: No procedures performed   Clinical Data: No additional findings.   Subjective: Chief Complaint  Patient presents with  . Left Knee - Follow-up    BKA 04/14/15  . Right Foot - Re-evaluation    History tib/cal fusion and hardware removal 2011 with double upright brace    Patient presents today for bilateral lower extremity reevaluation. He is s/p tibiocalcaneal fusion with hardware removal 2011. He is in double upright brace and needs a prescription today for new shoe wear for the right foot. He also has left transtibial amputation 04/14/15 and is needing prescription for new liners. He is currently working with [employer]. He feels his prosthetic leg is too heavy and is wanting to know if they is a lighter leg out there that would help his ambulation. He also is wanting to know about possible pain management. He is seeing a pain management group in [facility] but was told  they could not see him much longer. He was wanting to know if he could see our office on a prn basis for pain medication.     Review of Systems  Constitutional: Negative for chills and fever.  All other systems reviewed and are negative.    Objective: Vital Signs: Ht 6' (1.829 m)   Wt 260 lb (117.9 kg)   BMI 35.26 kg/m   Physical Exam Physical Exam  Constitutional: He is oriented to person, place, and time. He appears well-developed and well-nourished.  Pulmonary/Chest: Effort normal.  Neurological: He is alert and oriented to person, place, and time.  Psychiatric: He has a normal mood and affect.  Nursing note reviewed.   does have an antalgic gait. Ambulatory with below the knee prosthetic and a cane. Complains of pain on the left side and difficulty walking due to heavy prosthesis. Him left below the knee amputation is well healed well consolidated no open areas. The right lower extremity him is without open areas ulcerations or sign of infection. Does have difficulty walking on the right lower extremity due to pain in the ankle.  Ortho Exam  Specialty Comments:  No specialty comments available.  Imaging: No results found.   PMFS History: Patient Active Problem List   Diagnosis Date Noted  . Acute chest pain 09/12/2015  . Chest pain 09/12/2015  . Septic shock (  [place])   . MRSA bacteremia   . Esophageal reflux   . Infection and inflammatory reaction due to cardiac device, implant, and graft ([place])   . Diabetes mellitus type 2, uncontrolled ([place]) 04/18/2015  . Status post below knee amputation of left lower extremity ([place]) 04/17/2015  . S/P BKA (below knee amputation) unilateral ([place]) 04/14/2015  . 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 ESRD on dialysis ([place]) 10/23/2012  . DIASTOLIC DYSFUNCTION 01/05/7627  . PVD 10/22/2007  . ESRD 10/22/2007  . HYPERPARATHYROIDISM, HX OF 10/22/2007   Past Medical History:    Diagnosis Date  . Anemia   . Blood transfusion   . Bronchitis   . Chest pain   . Chronic kidney disease    dialysis, M[facility]W-F, [facility]  . Diabetes mellitus    type 2  . Dysrhythmia    irregular heartbeat  . ESRD (end stage renal disease) on dialysis ([place])    first HD 06/19/04  . GERD (gastroesophageal reflux disease)   . H/O hiatal hernia   . Hyperparathyroidism ([place])   . Hypertension   . Morbid obesity ([place])   . Pneumonia     Family History  Problem Relation Age of Onset  . Heart disease Mother   . Kidney disease Mother   . Hypertension Mother     Past Surgical History:  Procedure Laterality Date  . AMPUTATION Left 01/20/2015   Procedure: Left Great Toe Amputation;  Surgeon: Newt Minion, MD;  Location: [facility];  Service: Orthopedics;  Laterality: Left;  . AMPUTATION Left 03/07/2015   Procedure: Left Foot 1st and 2nd Ray Amputation;  Surgeon: Newt Minion, MD;  Location: [facility];  Service: Orthopedics;  Laterality: Left;  . AMPUTATION Left 04/14/2015   Procedure: LEFT BELOW THE KNEE AMPUTATION ;  Surgeon: Newt Minion, MD;  Location: [facility];  Service: Orthopedics;  Laterality: Left;  . ANKLE FUSION     rt foot charcot joint 2008 with refusion  . AV FISTULA PLACEMENT  10/11/2010  . AV FISTULA PLACEMENT  07/02/2011   Procedure: INSERTION OF ARTERIOVENOUS (AV) GORE-TEX GRAFT ARM;  Surgeon: Elam Dutch, MD;  Location: [facility];  Service: Vascular;  Laterality: Left;  . AV FISTULA PLACEMENT Right 11/06/2012   Procedure: INSERTION OF ARTERIOVENOUS (AV) GORE-TEX GRAFT ARM;  Surgeon: Mal Misty, MD;  Location: [facility];  Service: Vascular;  Laterality: Right;  . AV FISTULA PLACEMENT Right 03/21/2016   Procedure: INSERTION OF ARTERIOVENOUS (AV) GORE-TEX GRAFT ARM ( LOOP FOREARM );  Surgeon: Algernon Huxley, MD;  Location: [facility];  Service: Vascular;  Laterality: Right;  . [place] REMOVAL Right 11/06/2012   Procedure: REMOVAL OF ARTERIOVENOUS GORETEX GRAFT ([place]);  Surgeon: Mal Misty, MD;   Location: [facility];  Service: Vascular;  Laterality: Right;  . INSERTION OF DIALYSIS CATHETER  01/20/2012   Procedure: INSERTION OF DIALYSIS CATHETER;  Surgeon: Serafina Mitchell, MD;  Location: [facility];  Service: Vascular;  Laterality: N/A;  insertion left internal jugular dialysis catheter 23cm  . INSERTION OF DIALYSIS CATHETER Right 11/09/2012   Procedure: INSERTION OF DIALYSIS CATHETER;  Surgeon: Conrad Black Hawk, MD;  Location: [facility];  Service: Vascular;  Laterality: Right;  Right  Femoral Vein  . INSERTION OF DIALYSIS CATHETER N/A 08/18/2015   Procedure: INSERTION OF DIALYSIS CATHETER RIGHT FEMORAL VEIN;  Surgeon: Mal Misty, MD;  Location: [facility] [facility];  Service: Cardiovascular;  Laterality: N/A;  . PARATHYROIDECTOMY    .  PERIPHERAL VASCULAR CATHETERIZATION Right 04/04/2016   Procedure: A/V Shuntogram/Fistulagram;  Surgeon: Algernon Huxley, MD;  Location: [facility];  Service: Cardiovascular;  Laterality: Right;  . REMOVAL OF A HERO DEVICE Left 08/14/2015   Procedure: REMOVAL OF LEFT ARM HERO DEVICE;  Surgeon: Rosetta Posner, MD;  Location: [facility];  Service: Vascular;  Laterality: Left;  . TEE WITHOUT CARDIOVERSION N/A 08/18/2015   Procedure: TRANSESOPHAGEAL ECHOCARDIOGRAM (TEE);  Surgeon: Mal Misty, MD;  Location: [facility];  Service: Cardiovascular;  Laterality: N/A;  . TEE WITHOUT CARDIOVERSION N/A 12/21/2015   Procedure: TRANSESOPHAGEAL ECHOCARDIOGRAM (TEE);  Surgeon: Dixie Dials, MD;  Location: [facility];  Service: Cardiovascular;  Laterality: N/A;  . THROMBECTOMY    . Umbilcial Hernia    . VENOGRAM N/A 01/06/2014   Procedure: VENOGRAM;  Surgeon: Conrad Holcomb, MD;  Location: [facility];  Service: Cardiovascular;  Laterality: N/A;   Social History   Occupational History  . Not on file.   Social History Main Topics  . Smoking status: Current Every Day Smoker    Packs/day: 0.10    Years: 20.00    Types: Cigarettes    Last attempt to quit: 01/13/2015  . Smokeless tobacco: Never Used  . Alcohol  use No  . Drug use: No  . Sexual activity: Not on file

## 2016-05-24 DIAGNOSIS — E119 Type 2 diabetes mellitus without complications: Secondary | ICD-10-CM | POA: Diagnosis not present

## 2016-05-24 DIAGNOSIS — D509 Iron deficiency anemia, unspecified: Secondary | ICD-10-CM | POA: Diagnosis not present

## 2016-05-24 DIAGNOSIS — E209 Hypoparathyroidism, unspecified: Secondary | ICD-10-CM | POA: Diagnosis not present

## 2016-05-24 DIAGNOSIS — D631 Anemia in chronic kidney disease: Secondary | ICD-10-CM | POA: Diagnosis not present

## 2016-05-24 DIAGNOSIS — N186 End stage renal disease: Secondary | ICD-10-CM | POA: Diagnosis not present

## 2016-05-24 NOTE — Addendum Note (Signed)
Addended by: Dondra Prader R on: 05/24/2016 08:27 AM   Modules accepted: Orders

## 2016-05-27 DIAGNOSIS — D631 Anemia in chronic kidney disease: Secondary | ICD-10-CM | POA: Diagnosis not present

## 2016-05-27 DIAGNOSIS — E209 Hypoparathyroidism, unspecified: Secondary | ICD-10-CM | POA: Diagnosis not present

## 2016-05-27 DIAGNOSIS — N186 End stage renal disease: Secondary | ICD-10-CM | POA: Diagnosis not present

## 2016-05-27 DIAGNOSIS — E119 Type 2 diabetes mellitus without complications: Secondary | ICD-10-CM | POA: Diagnosis not present

## 2016-05-27 DIAGNOSIS — D509 Iron deficiency anemia, unspecified: Secondary | ICD-10-CM | POA: Diagnosis not present

## 2016-05-29 DIAGNOSIS — D631 Anemia in chronic kidney disease: Secondary | ICD-10-CM | POA: Diagnosis not present

## 2016-05-29 DIAGNOSIS — D509 Iron deficiency anemia, unspecified: Secondary | ICD-10-CM | POA: Diagnosis not present

## 2016-05-29 DIAGNOSIS — E209 Hypoparathyroidism, unspecified: Secondary | ICD-10-CM | POA: Diagnosis not present

## 2016-05-29 DIAGNOSIS — E119 Type 2 diabetes mellitus without complications: Secondary | ICD-10-CM | POA: Diagnosis not present

## 2016-05-29 DIAGNOSIS — N186 End stage renal disease: Secondary | ICD-10-CM | POA: Diagnosis not present

## 2016-05-31 DIAGNOSIS — D509 Iron deficiency anemia, unspecified: Secondary | ICD-10-CM | POA: Diagnosis not present

## 2016-05-31 DIAGNOSIS — E209 Hypoparathyroidism, unspecified: Secondary | ICD-10-CM | POA: Diagnosis not present

## 2016-05-31 DIAGNOSIS — N186 End stage renal disease: Secondary | ICD-10-CM | POA: Diagnosis not present

## 2016-05-31 DIAGNOSIS — D631 Anemia in chronic kidney disease: Secondary | ICD-10-CM | POA: Diagnosis not present

## 2016-05-31 DIAGNOSIS — E119 Type 2 diabetes mellitus without complications: Secondary | ICD-10-CM | POA: Diagnosis not present

## 2016-06-02 DIAGNOSIS — D631 Anemia in chronic kidney disease: Secondary | ICD-10-CM | POA: Diagnosis not present

## 2016-06-02 DIAGNOSIS — E209 Hypoparathyroidism, unspecified: Secondary | ICD-10-CM | POA: Diagnosis not present

## 2016-06-02 DIAGNOSIS — N186 End stage renal disease: Secondary | ICD-10-CM | POA: Diagnosis not present

## 2016-06-02 DIAGNOSIS — E119 Type 2 diabetes mellitus without complications: Secondary | ICD-10-CM | POA: Diagnosis not present

## 2016-06-02 DIAGNOSIS — D509 Iron deficiency anemia, unspecified: Secondary | ICD-10-CM | POA: Diagnosis not present

## 2016-06-04 DIAGNOSIS — E209 Hypoparathyroidism, unspecified: Secondary | ICD-10-CM | POA: Diagnosis not present

## 2016-06-04 DIAGNOSIS — N186 End stage renal disease: Secondary | ICD-10-CM | POA: Diagnosis not present

## 2016-06-04 DIAGNOSIS — G8929 Other chronic pain: Secondary | ICD-10-CM | POA: Diagnosis not present

## 2016-06-04 DIAGNOSIS — Z1389 Encounter for screening for other disorder: Secondary | ICD-10-CM | POA: Diagnosis not present

## 2016-06-04 DIAGNOSIS — M25571 Pain in right ankle and joints of right foot: Secondary | ICD-10-CM | POA: Diagnosis not present

## 2016-06-04 DIAGNOSIS — E119 Type 2 diabetes mellitus without complications: Secondary | ICD-10-CM | POA: Diagnosis not present

## 2016-06-04 DIAGNOSIS — D631 Anemia in chronic kidney disease: Secondary | ICD-10-CM | POA: Diagnosis not present

## 2016-06-04 DIAGNOSIS — D509 Iron deficiency anemia, unspecified: Secondary | ICD-10-CM | POA: Diagnosis not present

## 2016-06-07 DIAGNOSIS — E119 Type 2 diabetes mellitus without complications: Secondary | ICD-10-CM | POA: Diagnosis not present

## 2016-06-07 DIAGNOSIS — D509 Iron deficiency anemia, unspecified: Secondary | ICD-10-CM | POA: Diagnosis not present

## 2016-06-07 DIAGNOSIS — E209 Hypoparathyroidism, unspecified: Secondary | ICD-10-CM | POA: Diagnosis not present

## 2016-06-07 DIAGNOSIS — D631 Anemia in chronic kidney disease: Secondary | ICD-10-CM | POA: Diagnosis not present

## 2016-06-07 DIAGNOSIS — N186 End stage renal disease: Secondary | ICD-10-CM | POA: Diagnosis not present

## 2016-06-10 DIAGNOSIS — E119 Type 2 diabetes mellitus without complications: Secondary | ICD-10-CM | POA: Diagnosis not present

## 2016-06-10 DIAGNOSIS — D631 Anemia in chronic kidney disease: Secondary | ICD-10-CM | POA: Diagnosis not present

## 2016-06-10 DIAGNOSIS — E209 Hypoparathyroidism, unspecified: Secondary | ICD-10-CM | POA: Diagnosis not present

## 2016-06-10 DIAGNOSIS — D509 Iron deficiency anemia, unspecified: Secondary | ICD-10-CM | POA: Diagnosis not present

## 2016-06-10 DIAGNOSIS — N186 End stage renal disease: Secondary | ICD-10-CM | POA: Diagnosis not present

## 2016-06-12 DIAGNOSIS — E119 Type 2 diabetes mellitus without complications: Secondary | ICD-10-CM | POA: Diagnosis not present

## 2016-06-12 DIAGNOSIS — D509 Iron deficiency anemia, unspecified: Secondary | ICD-10-CM | POA: Diagnosis not present

## 2016-06-12 DIAGNOSIS — E209 Hypoparathyroidism, unspecified: Secondary | ICD-10-CM | POA: Diagnosis not present

## 2016-06-12 DIAGNOSIS — N186 End stage renal disease: Secondary | ICD-10-CM | POA: Diagnosis not present

## 2016-06-12 DIAGNOSIS — D631 Anemia in chronic kidney disease: Secondary | ICD-10-CM | POA: Diagnosis not present

## 2016-06-13 DIAGNOSIS — Z992 Dependence on renal dialysis: Secondary | ICD-10-CM | POA: Diagnosis not present

## 2016-06-13 DIAGNOSIS — E1129 Type 2 diabetes mellitus with other diabetic kidney complication: Secondary | ICD-10-CM | POA: Diagnosis not present

## 2016-06-13 DIAGNOSIS — N186 End stage renal disease: Secondary | ICD-10-CM | POA: Diagnosis not present

## 2016-06-14 DIAGNOSIS — E119 Type 2 diabetes mellitus without complications: Secondary | ICD-10-CM | POA: Diagnosis not present

## 2016-06-14 DIAGNOSIS — D509 Iron deficiency anemia, unspecified: Secondary | ICD-10-CM | POA: Diagnosis not present

## 2016-06-14 DIAGNOSIS — D631 Anemia in chronic kidney disease: Secondary | ICD-10-CM | POA: Diagnosis not present

## 2016-06-14 DIAGNOSIS — N186 End stage renal disease: Secondary | ICD-10-CM | POA: Diagnosis not present

## 2016-06-14 DIAGNOSIS — E209 Hypoparathyroidism, unspecified: Secondary | ICD-10-CM | POA: Diagnosis not present

## 2016-06-17 DIAGNOSIS — D509 Iron deficiency anemia, unspecified: Secondary | ICD-10-CM | POA: Diagnosis not present

## 2016-06-17 DIAGNOSIS — N186 End stage renal disease: Secondary | ICD-10-CM | POA: Diagnosis not present

## 2016-06-17 DIAGNOSIS — E209 Hypoparathyroidism, unspecified: Secondary | ICD-10-CM | POA: Diagnosis not present

## 2016-06-17 DIAGNOSIS — E119 Type 2 diabetes mellitus without complications: Secondary | ICD-10-CM | POA: Diagnosis not present

## 2016-06-17 DIAGNOSIS — D631 Anemia in chronic kidney disease: Secondary | ICD-10-CM | POA: Diagnosis not present

## 2016-06-19 DIAGNOSIS — E119 Type 2 diabetes mellitus without complications: Secondary | ICD-10-CM | POA: Diagnosis not present

## 2016-06-19 DIAGNOSIS — N186 End stage renal disease: Secondary | ICD-10-CM | POA: Diagnosis not present

## 2016-06-19 DIAGNOSIS — E209 Hypoparathyroidism, unspecified: Secondary | ICD-10-CM | POA: Diagnosis not present

## 2016-06-19 DIAGNOSIS — D509 Iron deficiency anemia, unspecified: Secondary | ICD-10-CM | POA: Diagnosis not present

## 2016-06-19 DIAGNOSIS — D631 Anemia in chronic kidney disease: Secondary | ICD-10-CM | POA: Diagnosis not present

## 2016-06-21 DIAGNOSIS — N186 End stage renal disease: Secondary | ICD-10-CM | POA: Diagnosis not present

## 2016-06-21 DIAGNOSIS — E209 Hypoparathyroidism, unspecified: Secondary | ICD-10-CM | POA: Diagnosis not present

## 2016-06-21 DIAGNOSIS — D631 Anemia in chronic kidney disease: Secondary | ICD-10-CM | POA: Diagnosis not present

## 2016-06-21 DIAGNOSIS — D509 Iron deficiency anemia, unspecified: Secondary | ICD-10-CM | POA: Diagnosis not present

## 2016-06-21 DIAGNOSIS — E119 Type 2 diabetes mellitus without complications: Secondary | ICD-10-CM | POA: Diagnosis not present

## 2016-06-24 DIAGNOSIS — E119 Type 2 diabetes mellitus without complications: Secondary | ICD-10-CM | POA: Diagnosis not present

## 2016-06-24 DIAGNOSIS — N186 End stage renal disease: Secondary | ICD-10-CM | POA: Diagnosis not present

## 2016-06-24 DIAGNOSIS — D631 Anemia in chronic kidney disease: Secondary | ICD-10-CM | POA: Diagnosis not present

## 2016-06-24 DIAGNOSIS — D509 Iron deficiency anemia, unspecified: Secondary | ICD-10-CM | POA: Diagnosis not present

## 2016-06-24 DIAGNOSIS — E209 Hypoparathyroidism, unspecified: Secondary | ICD-10-CM | POA: Diagnosis not present

## 2016-06-26 DIAGNOSIS — E209 Hypoparathyroidism, unspecified: Secondary | ICD-10-CM | POA: Diagnosis not present

## 2016-06-26 DIAGNOSIS — D631 Anemia in chronic kidney disease: Secondary | ICD-10-CM | POA: Diagnosis not present

## 2016-06-26 DIAGNOSIS — N186 End stage renal disease: Secondary | ICD-10-CM | POA: Diagnosis not present

## 2016-06-26 DIAGNOSIS — D509 Iron deficiency anemia, unspecified: Secondary | ICD-10-CM | POA: Diagnosis not present

## 2016-06-26 DIAGNOSIS — E119 Type 2 diabetes mellitus without complications: Secondary | ICD-10-CM | POA: Diagnosis not present

## 2016-06-28 DIAGNOSIS — E209 Hypoparathyroidism, unspecified: Secondary | ICD-10-CM | POA: Diagnosis not present

## 2016-06-28 DIAGNOSIS — D631 Anemia in chronic kidney disease: Secondary | ICD-10-CM | POA: Diagnosis not present

## 2016-06-28 DIAGNOSIS — N186 End stage renal disease: Secondary | ICD-10-CM | POA: Diagnosis not present

## 2016-06-28 DIAGNOSIS — D509 Iron deficiency anemia, unspecified: Secondary | ICD-10-CM | POA: Diagnosis not present

## 2016-06-28 DIAGNOSIS — E119 Type 2 diabetes mellitus without complications: Secondary | ICD-10-CM | POA: Diagnosis not present

## 2016-07-01 DIAGNOSIS — D631 Anemia in chronic kidney disease: Secondary | ICD-10-CM | POA: Diagnosis not present

## 2016-07-01 DIAGNOSIS — E119 Type 2 diabetes mellitus without complications: Secondary | ICD-10-CM | POA: Diagnosis not present

## 2016-07-01 DIAGNOSIS — N186 End stage renal disease: Secondary | ICD-10-CM | POA: Diagnosis not present

## 2016-07-01 DIAGNOSIS — E209 Hypoparathyroidism, unspecified: Secondary | ICD-10-CM | POA: Diagnosis not present

## 2016-07-01 DIAGNOSIS — D509 Iron deficiency anemia, unspecified: Secondary | ICD-10-CM | POA: Diagnosis not present

## 2016-07-02 DIAGNOSIS — M25571 Pain in right ankle and joints of right foot: Secondary | ICD-10-CM | POA: Diagnosis not present

## 2016-07-02 DIAGNOSIS — G8929 Other chronic pain: Secondary | ICD-10-CM | POA: Diagnosis not present

## 2016-07-02 DIAGNOSIS — G546 Phantom limb syndrome with pain: Secondary | ICD-10-CM | POA: Diagnosis not present

## 2016-07-03 DIAGNOSIS — D631 Anemia in chronic kidney disease: Secondary | ICD-10-CM | POA: Diagnosis not present

## 2016-07-03 DIAGNOSIS — E209 Hypoparathyroidism, unspecified: Secondary | ICD-10-CM | POA: Diagnosis not present

## 2016-07-03 DIAGNOSIS — E119 Type 2 diabetes mellitus without complications: Secondary | ICD-10-CM | POA: Diagnosis not present

## 2016-07-03 DIAGNOSIS — N186 End stage renal disease: Secondary | ICD-10-CM | POA: Diagnosis not present

## 2016-07-03 DIAGNOSIS — D509 Iron deficiency anemia, unspecified: Secondary | ICD-10-CM | POA: Diagnosis not present

## 2016-07-05 DIAGNOSIS — N186 End stage renal disease: Secondary | ICD-10-CM | POA: Diagnosis not present

## 2016-07-05 DIAGNOSIS — D631 Anemia in chronic kidney disease: Secondary | ICD-10-CM | POA: Diagnosis not present

## 2016-07-05 DIAGNOSIS — E119 Type 2 diabetes mellitus without complications: Secondary | ICD-10-CM | POA: Diagnosis not present

## 2016-07-05 DIAGNOSIS — E209 Hypoparathyroidism, unspecified: Secondary | ICD-10-CM | POA: Diagnosis not present

## 2016-07-05 DIAGNOSIS — D509 Iron deficiency anemia, unspecified: Secondary | ICD-10-CM | POA: Diagnosis not present

## 2016-07-07 DIAGNOSIS — E119 Type 2 diabetes mellitus without complications: Secondary | ICD-10-CM | POA: Diagnosis not present

## 2016-07-07 DIAGNOSIS — E209 Hypoparathyroidism, unspecified: Secondary | ICD-10-CM | POA: Diagnosis not present

## 2016-07-07 DIAGNOSIS — D631 Anemia in chronic kidney disease: Secondary | ICD-10-CM | POA: Diagnosis not present

## 2016-07-07 DIAGNOSIS — N186 End stage renal disease: Secondary | ICD-10-CM | POA: Diagnosis not present

## 2016-07-07 DIAGNOSIS — D509 Iron deficiency anemia, unspecified: Secondary | ICD-10-CM | POA: Diagnosis not present

## 2016-07-10 DIAGNOSIS — D631 Anemia in chronic kidney disease: Secondary | ICD-10-CM | POA: Diagnosis not present

## 2016-07-10 DIAGNOSIS — N186 End stage renal disease: Secondary | ICD-10-CM | POA: Diagnosis not present

## 2016-07-10 DIAGNOSIS — E119 Type 2 diabetes mellitus without complications: Secondary | ICD-10-CM | POA: Diagnosis not present

## 2016-07-10 DIAGNOSIS — D509 Iron deficiency anemia, unspecified: Secondary | ICD-10-CM | POA: Diagnosis not present

## 2016-07-10 DIAGNOSIS — E209 Hypoparathyroidism, unspecified: Secondary | ICD-10-CM | POA: Diagnosis not present

## 2016-07-12 DIAGNOSIS — N186 End stage renal disease: Secondary | ICD-10-CM | POA: Diagnosis not present

## 2016-07-12 DIAGNOSIS — E119 Type 2 diabetes mellitus without complications: Secondary | ICD-10-CM | POA: Diagnosis not present

## 2016-07-12 DIAGNOSIS — D509 Iron deficiency anemia, unspecified: Secondary | ICD-10-CM | POA: Diagnosis not present

## 2016-07-12 DIAGNOSIS — E209 Hypoparathyroidism, unspecified: Secondary | ICD-10-CM | POA: Diagnosis not present

## 2016-07-12 DIAGNOSIS — D631 Anemia in chronic kidney disease: Secondary | ICD-10-CM | POA: Diagnosis not present

## 2016-07-14 DIAGNOSIS — E1129 Type 2 diabetes mellitus with other diabetic kidney complication: Secondary | ICD-10-CM | POA: Diagnosis not present

## 2016-07-14 DIAGNOSIS — D509 Iron deficiency anemia, unspecified: Secondary | ICD-10-CM | POA: Diagnosis not present

## 2016-07-14 DIAGNOSIS — D631 Anemia in chronic kidney disease: Secondary | ICD-10-CM | POA: Diagnosis not present

## 2016-07-14 DIAGNOSIS — E209 Hypoparathyroidism, unspecified: Secondary | ICD-10-CM | POA: Diagnosis not present

## 2016-07-14 DIAGNOSIS — Z992 Dependence on renal dialysis: Secondary | ICD-10-CM | POA: Diagnosis not present

## 2016-07-14 DIAGNOSIS — E119 Type 2 diabetes mellitus without complications: Secondary | ICD-10-CM | POA: Diagnosis not present

## 2016-07-14 DIAGNOSIS — N186 End stage renal disease: Secondary | ICD-10-CM | POA: Diagnosis not present

## 2016-07-17 DIAGNOSIS — D631 Anemia in chronic kidney disease: Secondary | ICD-10-CM | POA: Diagnosis not present

## 2016-07-17 DIAGNOSIS — N2581 Secondary hyperparathyroidism of renal origin: Secondary | ICD-10-CM | POA: Diagnosis not present

## 2016-07-17 DIAGNOSIS — D509 Iron deficiency anemia, unspecified: Secondary | ICD-10-CM | POA: Diagnosis not present

## 2016-07-17 DIAGNOSIS — E209 Hypoparathyroidism, unspecified: Secondary | ICD-10-CM | POA: Diagnosis not present

## 2016-07-17 DIAGNOSIS — N186 End stage renal disease: Secondary | ICD-10-CM | POA: Diagnosis not present

## 2016-07-17 DIAGNOSIS — E119 Type 2 diabetes mellitus without complications: Secondary | ICD-10-CM | POA: Diagnosis not present

## 2016-07-19 DIAGNOSIS — D509 Iron deficiency anemia, unspecified: Secondary | ICD-10-CM | POA: Diagnosis not present

## 2016-07-19 DIAGNOSIS — N186 End stage renal disease: Secondary | ICD-10-CM | POA: Diagnosis not present

## 2016-07-19 DIAGNOSIS — N2581 Secondary hyperparathyroidism of renal origin: Secondary | ICD-10-CM | POA: Diagnosis not present

## 2016-07-19 DIAGNOSIS — E119 Type 2 diabetes mellitus without complications: Secondary | ICD-10-CM | POA: Diagnosis not present

## 2016-07-19 DIAGNOSIS — E209 Hypoparathyroidism, unspecified: Secondary | ICD-10-CM | POA: Diagnosis not present

## 2016-07-19 DIAGNOSIS — D631 Anemia in chronic kidney disease: Secondary | ICD-10-CM | POA: Diagnosis not present

## 2016-07-22 DIAGNOSIS — D631 Anemia in chronic kidney disease: Secondary | ICD-10-CM | POA: Diagnosis not present

## 2016-07-22 DIAGNOSIS — N2581 Secondary hyperparathyroidism of renal origin: Secondary | ICD-10-CM | POA: Diagnosis not present

## 2016-07-22 DIAGNOSIS — N186 End stage renal disease: Secondary | ICD-10-CM | POA: Diagnosis not present

## 2016-07-22 DIAGNOSIS — E119 Type 2 diabetes mellitus without complications: Secondary | ICD-10-CM | POA: Diagnosis not present

## 2016-07-22 DIAGNOSIS — D509 Iron deficiency anemia, unspecified: Secondary | ICD-10-CM | POA: Diagnosis not present

## 2016-07-22 DIAGNOSIS — E209 Hypoparathyroidism, unspecified: Secondary | ICD-10-CM | POA: Diagnosis not present

## 2016-07-24 DIAGNOSIS — D631 Anemia in chronic kidney disease: Secondary | ICD-10-CM | POA: Diagnosis not present

## 2016-07-24 DIAGNOSIS — D509 Iron deficiency anemia, unspecified: Secondary | ICD-10-CM | POA: Diagnosis not present

## 2016-07-24 DIAGNOSIS — N2581 Secondary hyperparathyroidism of renal origin: Secondary | ICD-10-CM | POA: Diagnosis not present

## 2016-07-24 DIAGNOSIS — E209 Hypoparathyroidism, unspecified: Secondary | ICD-10-CM | POA: Diagnosis not present

## 2016-07-24 DIAGNOSIS — N186 End stage renal disease: Secondary | ICD-10-CM | POA: Diagnosis not present

## 2016-07-24 DIAGNOSIS — E119 Type 2 diabetes mellitus without complications: Secondary | ICD-10-CM | POA: Diagnosis not present

## 2016-07-26 DIAGNOSIS — D631 Anemia in chronic kidney disease: Secondary | ICD-10-CM | POA: Diagnosis not present

## 2016-07-26 DIAGNOSIS — E209 Hypoparathyroidism, unspecified: Secondary | ICD-10-CM | POA: Diagnosis not present

## 2016-07-26 DIAGNOSIS — N186 End stage renal disease: Secondary | ICD-10-CM | POA: Diagnosis not present

## 2016-07-26 DIAGNOSIS — N2581 Secondary hyperparathyroidism of renal origin: Secondary | ICD-10-CM | POA: Diagnosis not present

## 2016-07-26 DIAGNOSIS — E119 Type 2 diabetes mellitus without complications: Secondary | ICD-10-CM | POA: Diagnosis not present

## 2016-07-26 DIAGNOSIS — D509 Iron deficiency anemia, unspecified: Secondary | ICD-10-CM | POA: Diagnosis not present

## 2016-07-29 DIAGNOSIS — D631 Anemia in chronic kidney disease: Secondary | ICD-10-CM | POA: Diagnosis not present

## 2016-07-29 DIAGNOSIS — D509 Iron deficiency anemia, unspecified: Secondary | ICD-10-CM | POA: Diagnosis not present

## 2016-07-29 DIAGNOSIS — N2581 Secondary hyperparathyroidism of renal origin: Secondary | ICD-10-CM | POA: Diagnosis not present

## 2016-07-29 DIAGNOSIS — E209 Hypoparathyroidism, unspecified: Secondary | ICD-10-CM | POA: Diagnosis not present

## 2016-07-29 DIAGNOSIS — E119 Type 2 diabetes mellitus without complications: Secondary | ICD-10-CM | POA: Diagnosis not present

## 2016-07-29 DIAGNOSIS — N186 End stage renal disease: Secondary | ICD-10-CM | POA: Diagnosis not present

## 2016-07-31 DIAGNOSIS — N2581 Secondary hyperparathyroidism of renal origin: Secondary | ICD-10-CM | POA: Diagnosis not present

## 2016-07-31 DIAGNOSIS — N186 End stage renal disease: Secondary | ICD-10-CM | POA: Diagnosis not present

## 2016-07-31 DIAGNOSIS — D509 Iron deficiency anemia, unspecified: Secondary | ICD-10-CM | POA: Diagnosis not present

## 2016-07-31 DIAGNOSIS — E119 Type 2 diabetes mellitus without complications: Secondary | ICD-10-CM | POA: Diagnosis not present

## 2016-07-31 DIAGNOSIS — E209 Hypoparathyroidism, unspecified: Secondary | ICD-10-CM | POA: Diagnosis not present

## 2016-07-31 DIAGNOSIS — D631 Anemia in chronic kidney disease: Secondary | ICD-10-CM | POA: Diagnosis not present

## 2016-08-02 DIAGNOSIS — D509 Iron deficiency anemia, unspecified: Secondary | ICD-10-CM | POA: Diagnosis not present

## 2016-08-02 DIAGNOSIS — N186 End stage renal disease: Secondary | ICD-10-CM | POA: Diagnosis not present

## 2016-08-02 DIAGNOSIS — D631 Anemia in chronic kidney disease: Secondary | ICD-10-CM | POA: Diagnosis not present

## 2016-08-02 DIAGNOSIS — E209 Hypoparathyroidism, unspecified: Secondary | ICD-10-CM | POA: Diagnosis not present

## 2016-08-02 DIAGNOSIS — N2581 Secondary hyperparathyroidism of renal origin: Secondary | ICD-10-CM | POA: Diagnosis not present

## 2016-08-02 DIAGNOSIS — E119 Type 2 diabetes mellitus without complications: Secondary | ICD-10-CM | POA: Diagnosis not present

## 2016-08-03 DIAGNOSIS — N2581 Secondary hyperparathyroidism of renal origin: Secondary | ICD-10-CM | POA: Diagnosis not present

## 2016-08-03 DIAGNOSIS — E8779 Other fluid overload: Secondary | ICD-10-CM | POA: Diagnosis not present

## 2016-08-03 DIAGNOSIS — N186 End stage renal disease: Secondary | ICD-10-CM | POA: Diagnosis not present

## 2016-08-05 DIAGNOSIS — N2581 Secondary hyperparathyroidism of renal origin: Secondary | ICD-10-CM | POA: Diagnosis not present

## 2016-08-05 DIAGNOSIS — D631 Anemia in chronic kidney disease: Secondary | ICD-10-CM | POA: Diagnosis not present

## 2016-08-05 DIAGNOSIS — E119 Type 2 diabetes mellitus without complications: Secondary | ICD-10-CM | POA: Diagnosis not present

## 2016-08-05 DIAGNOSIS — N186 End stage renal disease: Secondary | ICD-10-CM | POA: Diagnosis not present

## 2016-08-05 DIAGNOSIS — E209 Hypoparathyroidism, unspecified: Secondary | ICD-10-CM | POA: Diagnosis not present

## 2016-08-05 DIAGNOSIS — D509 Iron deficiency anemia, unspecified: Secondary | ICD-10-CM | POA: Diagnosis not present

## 2016-08-07 DIAGNOSIS — E1129 Type 2 diabetes mellitus with other diabetic kidney complication: Secondary | ICD-10-CM | POA: Diagnosis not present

## 2016-08-07 DIAGNOSIS — E119 Type 2 diabetes mellitus without complications: Secondary | ICD-10-CM | POA: Diagnosis not present

## 2016-08-07 DIAGNOSIS — N186 End stage renal disease: Secondary | ICD-10-CM | POA: Diagnosis not present

## 2016-08-07 DIAGNOSIS — N2581 Secondary hyperparathyroidism of renal origin: Secondary | ICD-10-CM | POA: Diagnosis not present

## 2016-08-07 DIAGNOSIS — E209 Hypoparathyroidism, unspecified: Secondary | ICD-10-CM | POA: Diagnosis not present

## 2016-08-07 DIAGNOSIS — D509 Iron deficiency anemia, unspecified: Secondary | ICD-10-CM | POA: Diagnosis not present

## 2016-08-07 DIAGNOSIS — D631 Anemia in chronic kidney disease: Secondary | ICD-10-CM | POA: Diagnosis not present

## 2016-08-09 DIAGNOSIS — N186 End stage renal disease: Secondary | ICD-10-CM | POA: Diagnosis not present

## 2016-08-09 DIAGNOSIS — E209 Hypoparathyroidism, unspecified: Secondary | ICD-10-CM | POA: Diagnosis not present

## 2016-08-09 DIAGNOSIS — N2581 Secondary hyperparathyroidism of renal origin: Secondary | ICD-10-CM | POA: Diagnosis not present

## 2016-08-09 DIAGNOSIS — D631 Anemia in chronic kidney disease: Secondary | ICD-10-CM | POA: Diagnosis not present

## 2016-08-09 DIAGNOSIS — E119 Type 2 diabetes mellitus without complications: Secondary | ICD-10-CM | POA: Diagnosis not present

## 2016-08-09 DIAGNOSIS — D509 Iron deficiency anemia, unspecified: Secondary | ICD-10-CM | POA: Diagnosis not present

## 2016-08-12 DIAGNOSIS — E209 Hypoparathyroidism, unspecified: Secondary | ICD-10-CM | POA: Diagnosis not present

## 2016-08-12 DIAGNOSIS — N186 End stage renal disease: Secondary | ICD-10-CM | POA: Diagnosis not present

## 2016-08-12 DIAGNOSIS — N2581 Secondary hyperparathyroidism of renal origin: Secondary | ICD-10-CM | POA: Diagnosis not present

## 2016-08-12 DIAGNOSIS — D509 Iron deficiency anemia, unspecified: Secondary | ICD-10-CM | POA: Diagnosis not present

## 2016-08-12 DIAGNOSIS — E119 Type 2 diabetes mellitus without complications: Secondary | ICD-10-CM | POA: Diagnosis not present

## 2016-08-12 DIAGNOSIS — D631 Anemia in chronic kidney disease: Secondary | ICD-10-CM | POA: Diagnosis not present

## 2016-08-14 DIAGNOSIS — D631 Anemia in chronic kidney disease: Secondary | ICD-10-CM | POA: Diagnosis not present

## 2016-08-14 DIAGNOSIS — E209 Hypoparathyroidism, unspecified: Secondary | ICD-10-CM | POA: Diagnosis not present

## 2016-08-14 DIAGNOSIS — N186 End stage renal disease: Secondary | ICD-10-CM | POA: Diagnosis not present

## 2016-08-14 DIAGNOSIS — N2581 Secondary hyperparathyroidism of renal origin: Secondary | ICD-10-CM | POA: Diagnosis not present

## 2016-08-14 DIAGNOSIS — D509 Iron deficiency anemia, unspecified: Secondary | ICD-10-CM | POA: Diagnosis not present

## 2016-08-14 DIAGNOSIS — E119 Type 2 diabetes mellitus without complications: Secondary | ICD-10-CM | POA: Diagnosis not present

## 2016-08-14 DIAGNOSIS — Z992 Dependence on renal dialysis: Secondary | ICD-10-CM | POA: Diagnosis not present

## 2016-08-14 DIAGNOSIS — E1129 Type 2 diabetes mellitus with other diabetic kidney complication: Secondary | ICD-10-CM | POA: Diagnosis not present

## 2016-08-16 DIAGNOSIS — E209 Hypoparathyroidism, unspecified: Secondary | ICD-10-CM | POA: Diagnosis not present

## 2016-08-16 DIAGNOSIS — E119 Type 2 diabetes mellitus without complications: Secondary | ICD-10-CM | POA: Diagnosis not present

## 2016-08-16 DIAGNOSIS — D509 Iron deficiency anemia, unspecified: Secondary | ICD-10-CM | POA: Diagnosis not present

## 2016-08-16 DIAGNOSIS — D631 Anemia in chronic kidney disease: Secondary | ICD-10-CM | POA: Diagnosis not present

## 2016-08-16 DIAGNOSIS — N2581 Secondary hyperparathyroidism of renal origin: Secondary | ICD-10-CM | POA: Diagnosis not present

## 2016-08-16 DIAGNOSIS — N186 End stage renal disease: Secondary | ICD-10-CM | POA: Diagnosis not present

## 2016-08-16 DIAGNOSIS — D689 Coagulation defect, unspecified: Secondary | ICD-10-CM | POA: Diagnosis not present

## 2016-08-19 DIAGNOSIS — D689 Coagulation defect, unspecified: Secondary | ICD-10-CM | POA: Diagnosis not present

## 2016-08-19 DIAGNOSIS — N186 End stage renal disease: Secondary | ICD-10-CM | POA: Diagnosis not present

## 2016-08-19 DIAGNOSIS — D631 Anemia in chronic kidney disease: Secondary | ICD-10-CM | POA: Diagnosis not present

## 2016-08-19 DIAGNOSIS — E209 Hypoparathyroidism, unspecified: Secondary | ICD-10-CM | POA: Diagnosis not present

## 2016-08-19 DIAGNOSIS — D509 Iron deficiency anemia, unspecified: Secondary | ICD-10-CM | POA: Diagnosis not present

## 2016-08-19 DIAGNOSIS — E119 Type 2 diabetes mellitus without complications: Secondary | ICD-10-CM | POA: Diagnosis not present

## 2016-08-20 IMAGING — CR DG CHEST 2V
2 series · 2 of 2 positions shown · non-contrast
Comparison: 09/12/2015

CLINICAL DATA: Right-sided chest pain

EXAM:
CHEST  2 VIEW

[chest lat]
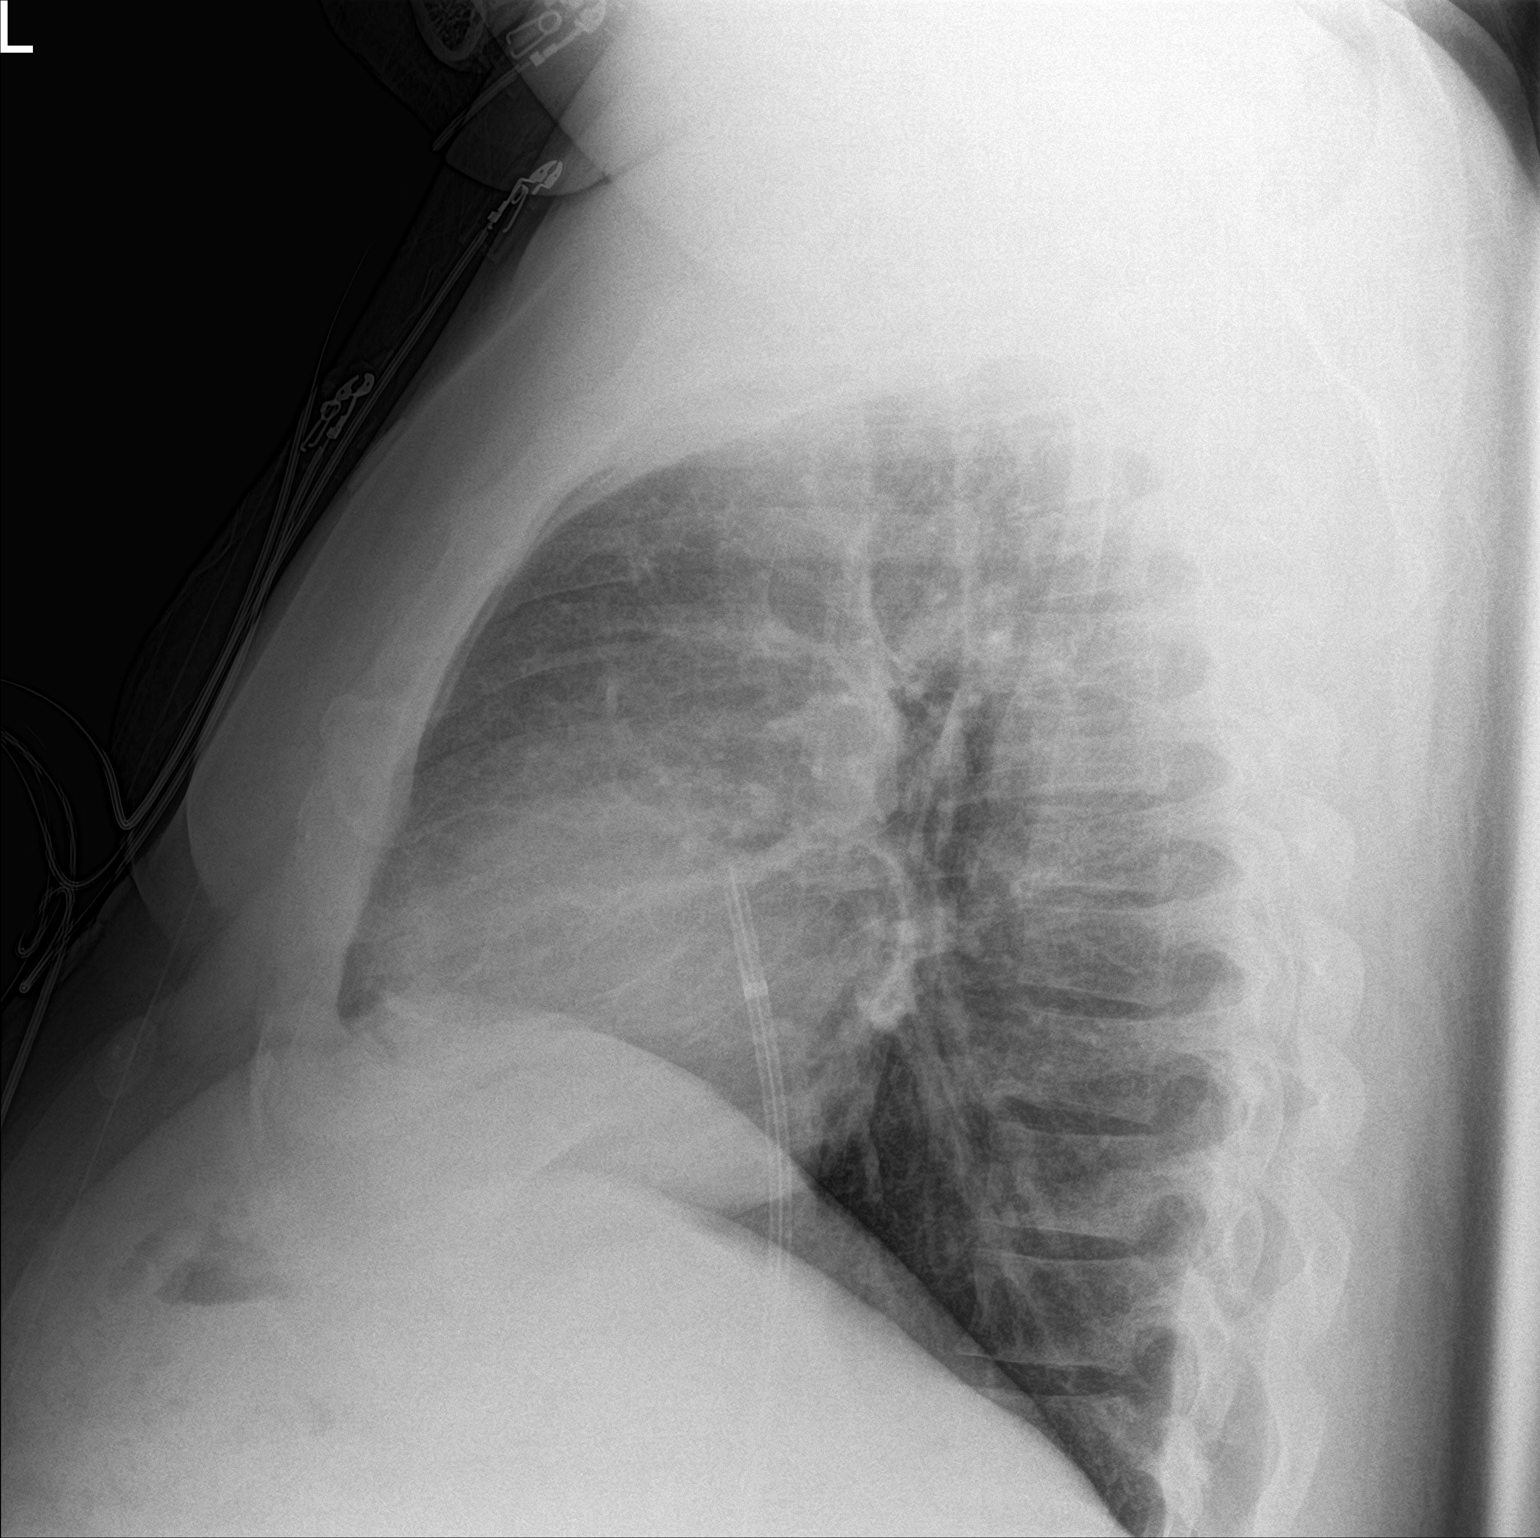

[chest ap]
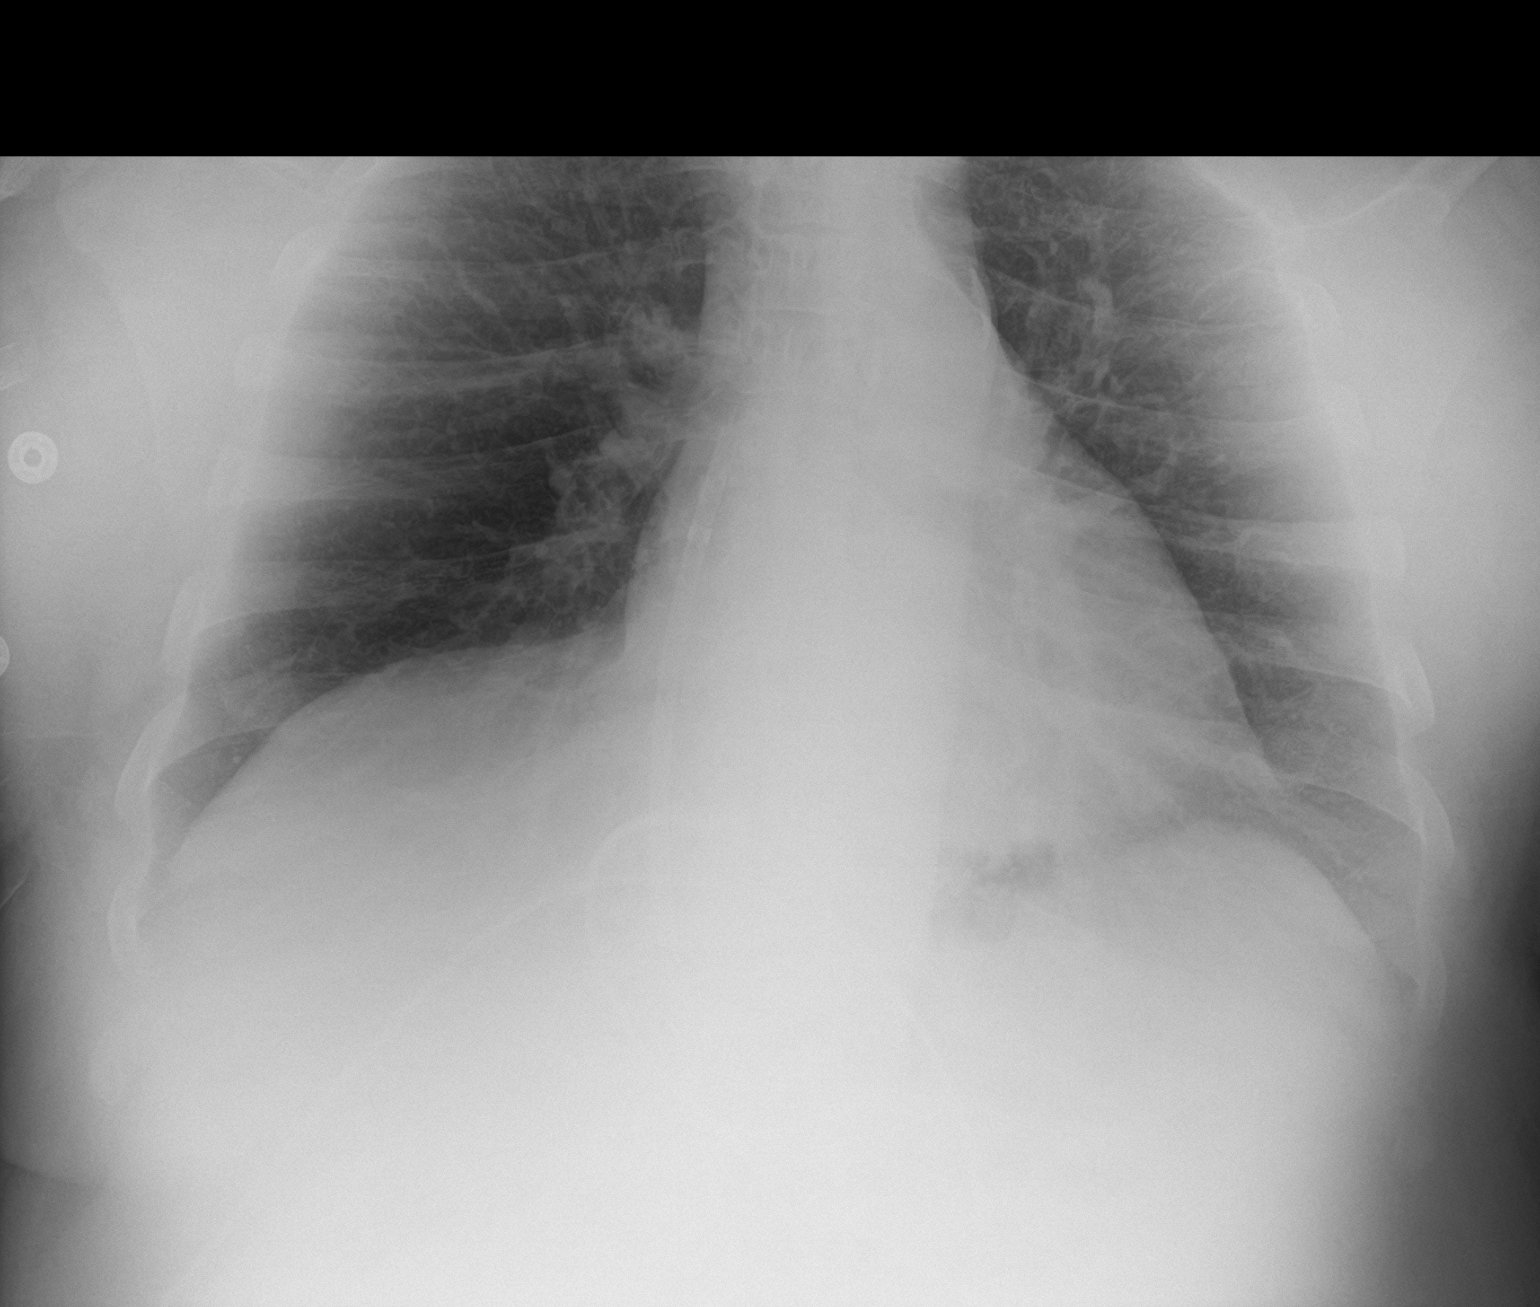

[2 of 2 positions shown; findings below may reference images not displayed]

FINDINGS: Dialysis catheter from below with tip at the upper cavoatrial
junction. Mildly low volume chest. There is no edema, consolidation,
effusion, or pneumothorax. Normal heart size and aortic contours.
IMPRESSION: Stable.  No evidence of acute disease.

## 2016-08-21 DIAGNOSIS — E119 Type 2 diabetes mellitus without complications: Secondary | ICD-10-CM | POA: Diagnosis not present

## 2016-08-21 DIAGNOSIS — D509 Iron deficiency anemia, unspecified: Secondary | ICD-10-CM | POA: Diagnosis not present

## 2016-08-21 DIAGNOSIS — N186 End stage renal disease: Secondary | ICD-10-CM | POA: Diagnosis not present

## 2016-08-21 DIAGNOSIS — E209 Hypoparathyroidism, unspecified: Secondary | ICD-10-CM | POA: Diagnosis not present

## 2016-08-21 DIAGNOSIS — D689 Coagulation defect, unspecified: Secondary | ICD-10-CM | POA: Diagnosis not present

## 2016-08-21 DIAGNOSIS — D631 Anemia in chronic kidney disease: Secondary | ICD-10-CM | POA: Diagnosis not present

## 2016-08-23 DIAGNOSIS — D689 Coagulation defect, unspecified: Secondary | ICD-10-CM | POA: Diagnosis not present

## 2016-08-23 DIAGNOSIS — N186 End stage renal disease: Secondary | ICD-10-CM | POA: Diagnosis not present

## 2016-08-23 DIAGNOSIS — D631 Anemia in chronic kidney disease: Secondary | ICD-10-CM | POA: Diagnosis not present

## 2016-08-23 DIAGNOSIS — E119 Type 2 diabetes mellitus without complications: Secondary | ICD-10-CM | POA: Diagnosis not present

## 2016-08-23 DIAGNOSIS — E209 Hypoparathyroidism, unspecified: Secondary | ICD-10-CM | POA: Diagnosis not present

## 2016-08-23 DIAGNOSIS — D509 Iron deficiency anemia, unspecified: Secondary | ICD-10-CM | POA: Diagnosis not present

## 2016-08-26 DIAGNOSIS — E119 Type 2 diabetes mellitus without complications: Secondary | ICD-10-CM | POA: Diagnosis not present

## 2016-08-26 DIAGNOSIS — E209 Hypoparathyroidism, unspecified: Secondary | ICD-10-CM | POA: Diagnosis not present

## 2016-08-26 DIAGNOSIS — N186 End stage renal disease: Secondary | ICD-10-CM | POA: Diagnosis not present

## 2016-08-26 DIAGNOSIS — D689 Coagulation defect, unspecified: Secondary | ICD-10-CM | POA: Diagnosis not present

## 2016-08-26 DIAGNOSIS — D509 Iron deficiency anemia, unspecified: Secondary | ICD-10-CM | POA: Diagnosis not present

## 2016-08-26 DIAGNOSIS — D631 Anemia in chronic kidney disease: Secondary | ICD-10-CM | POA: Diagnosis not present

## 2016-08-28 DIAGNOSIS — N186 End stage renal disease: Secondary | ICD-10-CM | POA: Diagnosis not present

## 2016-08-28 DIAGNOSIS — E119 Type 2 diabetes mellitus without complications: Secondary | ICD-10-CM | POA: Diagnosis not present

## 2016-08-28 DIAGNOSIS — D509 Iron deficiency anemia, unspecified: Secondary | ICD-10-CM | POA: Diagnosis not present

## 2016-08-28 DIAGNOSIS — D689 Coagulation defect, unspecified: Secondary | ICD-10-CM | POA: Diagnosis not present

## 2016-08-28 DIAGNOSIS — E209 Hypoparathyroidism, unspecified: Secondary | ICD-10-CM | POA: Diagnosis not present

## 2016-08-28 DIAGNOSIS — D631 Anemia in chronic kidney disease: Secondary | ICD-10-CM | POA: Diagnosis not present

## 2016-08-29 DIAGNOSIS — N186 End stage renal disease: Secondary | ICD-10-CM | POA: Diagnosis not present

## 2016-08-29 DIAGNOSIS — N2581 Secondary hyperparathyroidism of renal origin: Secondary | ICD-10-CM | POA: Diagnosis not present

## 2016-08-29 DIAGNOSIS — E8770 Fluid overload, unspecified: Secondary | ICD-10-CM | POA: Diagnosis not present

## 2016-08-30 DIAGNOSIS — D631 Anemia in chronic kidney disease: Secondary | ICD-10-CM | POA: Diagnosis not present

## 2016-08-30 DIAGNOSIS — D509 Iron deficiency anemia, unspecified: Secondary | ICD-10-CM | POA: Diagnosis not present

## 2016-08-30 DIAGNOSIS — N186 End stage renal disease: Secondary | ICD-10-CM | POA: Diagnosis not present

## 2016-08-30 DIAGNOSIS — D689 Coagulation defect, unspecified: Secondary | ICD-10-CM | POA: Diagnosis not present

## 2016-08-30 DIAGNOSIS — E209 Hypoparathyroidism, unspecified: Secondary | ICD-10-CM | POA: Diagnosis not present

## 2016-08-30 DIAGNOSIS — E119 Type 2 diabetes mellitus without complications: Secondary | ICD-10-CM | POA: Diagnosis not present

## 2016-09-02 DIAGNOSIS — D631 Anemia in chronic kidney disease: Secondary | ICD-10-CM | POA: Diagnosis not present

## 2016-09-02 DIAGNOSIS — E119 Type 2 diabetes mellitus without complications: Secondary | ICD-10-CM | POA: Diagnosis not present

## 2016-09-02 DIAGNOSIS — N186 End stage renal disease: Secondary | ICD-10-CM | POA: Diagnosis not present

## 2016-09-02 DIAGNOSIS — D509 Iron deficiency anemia, unspecified: Secondary | ICD-10-CM | POA: Diagnosis not present

## 2016-09-02 DIAGNOSIS — D689 Coagulation defect, unspecified: Secondary | ICD-10-CM | POA: Diagnosis not present

## 2016-09-02 DIAGNOSIS — E209 Hypoparathyroidism, unspecified: Secondary | ICD-10-CM | POA: Diagnosis not present

## 2016-09-04 DIAGNOSIS — D631 Anemia in chronic kidney disease: Secondary | ICD-10-CM | POA: Diagnosis not present

## 2016-09-04 DIAGNOSIS — D509 Iron deficiency anemia, unspecified: Secondary | ICD-10-CM | POA: Diagnosis not present

## 2016-09-04 DIAGNOSIS — D689 Coagulation defect, unspecified: Secondary | ICD-10-CM | POA: Diagnosis not present

## 2016-09-04 DIAGNOSIS — E209 Hypoparathyroidism, unspecified: Secondary | ICD-10-CM | POA: Diagnosis not present

## 2016-09-04 DIAGNOSIS — E119 Type 2 diabetes mellitus without complications: Secondary | ICD-10-CM | POA: Diagnosis not present

## 2016-09-04 DIAGNOSIS — N186 End stage renal disease: Secondary | ICD-10-CM | POA: Diagnosis not present

## 2016-09-06 DIAGNOSIS — N186 End stage renal disease: Secondary | ICD-10-CM | POA: Diagnosis not present

## 2016-09-06 DIAGNOSIS — E119 Type 2 diabetes mellitus without complications: Secondary | ICD-10-CM | POA: Diagnosis not present

## 2016-09-06 DIAGNOSIS — D509 Iron deficiency anemia, unspecified: Secondary | ICD-10-CM | POA: Diagnosis not present

## 2016-09-06 DIAGNOSIS — D631 Anemia in chronic kidney disease: Secondary | ICD-10-CM | POA: Diagnosis not present

## 2016-09-06 DIAGNOSIS — E209 Hypoparathyroidism, unspecified: Secondary | ICD-10-CM | POA: Diagnosis not present

## 2016-09-06 DIAGNOSIS — D689 Coagulation defect, unspecified: Secondary | ICD-10-CM | POA: Diagnosis not present

## 2016-09-09 ENCOUNTER — Encounter (HOSPITAL_COMMUNITY): Payer: Self-pay

## 2016-09-09 ENCOUNTER — Emergency Department (HOSPITAL_COMMUNITY)
Admission: EM | Admit: 2016-09-09 | Discharge: 2016-09-09 | Disposition: A | Discharge status: Home / Self Care | Payer: Medicare Other | Attending: Emergency Medicine | Admitting: Emergency Medicine

## 2016-09-09 DIAGNOSIS — M7989 Other specified soft tissue disorders: Secondary | ICD-10-CM | POA: Diagnosis present

## 2016-09-09 DIAGNOSIS — L02413 Cutaneous abscess of right upper limb: Secondary | ICD-10-CM | POA: Diagnosis not present

## 2016-09-09 DIAGNOSIS — I120 Hypertensive chronic kidney disease with stage 5 chronic kidney disease or end stage renal disease: Secondary | ICD-10-CM | POA: Diagnosis not present

## 2016-09-09 DIAGNOSIS — D509 Iron deficiency anemia, unspecified: Secondary | ICD-10-CM | POA: Diagnosis not present

## 2016-09-09 DIAGNOSIS — F17210 Nicotine dependence, cigarettes, uncomplicated: Secondary | ICD-10-CM | POA: Diagnosis not present

## 2016-09-09 DIAGNOSIS — Z992 Dependence on renal dialysis: Secondary | ICD-10-CM | POA: Diagnosis not present

## 2016-09-09 DIAGNOSIS — N186 End stage renal disease: Secondary | ICD-10-CM | POA: Diagnosis not present

## 2016-09-09 DIAGNOSIS — D689 Coagulation defect, unspecified: Secondary | ICD-10-CM | POA: Diagnosis not present

## 2016-09-09 DIAGNOSIS — E119 Type 2 diabetes mellitus without complications: Secondary | ICD-10-CM | POA: Insufficient documentation

## 2016-09-09 DIAGNOSIS — Z7982 Long term (current) use of aspirin: Secondary | ICD-10-CM | POA: Diagnosis not present

## 2016-09-09 DIAGNOSIS — Z794 Long term (current) use of insulin: Secondary | ICD-10-CM | POA: Insufficient documentation

## 2016-09-09 DIAGNOSIS — L03113 Cellulitis of right upper limb: Secondary | ICD-10-CM

## 2016-09-09 DIAGNOSIS — E209 Hypoparathyroidism, unspecified: Secondary | ICD-10-CM | POA: Diagnosis not present

## 2016-09-09 DIAGNOSIS — D631 Anemia in chronic kidney disease: Secondary | ICD-10-CM | POA: Diagnosis not present

## 2016-09-09 LAB — CBG MONITORING, ED: Glucose-Capillary: 288 mg/dL — ABNORMAL HIGH (ref 65–99)

## 2016-09-09 MED ORDER — DOXYCYCLINE HYCLATE 100 MG PO TABS
100.0000 mg | ORAL_TABLET | Freq: Once | ORAL | Status: AC
  Administered 2016-09-09: 100 mg via ORAL
  Filled 2016-09-09: qty 1

## 2016-09-09 MED ORDER — DOXYCYCLINE HYCLATE 100 MG PO CAPS: 100.0000 mg | ORAL_CAPSULE | Freq: Two times a day (BID) | ORAL | 0 refills | Status: DC

## 2016-09-09 MED ORDER — LIDOCAINE-EPINEPHRINE (PF) 2 %-1:200000 IJ SOLN
10.0000 mL | Freq: Once | INTRAMUSCULAR | Status: AC
  Administered 2016-09-09: 10 mL
  Filled 2016-09-09: qty 20

## 2016-09-09 MED ORDER — OXYCODONE-ACETAMINOPHEN 5-325 MG PO TABS: 1.0000 | ORAL_TABLET | ORAL | 0 refills | Status: DC | PRN

## 2016-09-09 NOTE — ED Notes (Signed)
BLOOD SUGAR 288

## 2016-09-09 NOTE — ED Notes (Signed)
Stationary dressing applied to Right shoulder abscess area. Dressing is clean, dry, and, intact.

## 2016-09-09 NOTE — ED Provider Notes (Signed)
[facility] DEPT Provider Note   CSN: [id number] Arrival date & time: 09/09/16  0207     History   Chief Complaint Chief Complaint  Patient presents with  . Skin Ulcer    HPI [name] is a 51 y.o. male.  Patient with history of DM, ESRD-HD, GERD, HTN, HLD presents with complaint of painful swelling to right shoulder x 3 days. No fever. He denies drainage from the area. No history of abscess. No known injury.   The history is provided by the patient. No language interpreter was used.    Past Medical History:  Diagnosis Date  . Anemia   . Blood transfusion   . Bronchitis   . Chest pain   . Chronic kidney disease    d[facility], [facility]  . Diabetes mellitus    type 2  . Dysrhythmia    irregular heartbeat  . ESRD (end stage renal disease) on dialysis ([place])    first HD 06/19/04  . GERD (gastroesophageal reflux disease)   . H/O hiatal hernia   . Hyperparathyroidism ([place])   . Hypertension   . Morbid obesity ([place])   . Pneumonia     Patient Active Problem List   Diagnosis Date Noted  . Status post ankle fusion 05/23/2016  . Acute chest pain 09/12/2015  . Chest pain 09/12/2015  . Septic shock ([place])   . MRSA bacteremia   . Esophageal reflux   . Infection and inflammatory reaction due to cardiac device, implant, and graft ([place])   . Diabetes mellitus type 2, uncontrolled ([place]) 04/18/2015  . Status post below knee amputation of left lower extremity ([place]) 04/17/2015  . 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 ESRD on dialysis ([place]) 10/23/2012  . DIASTOLIC DYSFUNCTION 53/97/6734  . PVD 10/22/2007  . ESRD 10/22/2007  . HYPERPARATHYROIDISM, HX OF 10/22/2007    Past Surgical History:  Procedure Laterality Date  . AMPUTATION Left 01/20/2015   Procedure: Left Great Toe Amputation;  Surgeon: Newt Minion, MD;  Location: [facility];  Service: Orthopedics;  Laterality: Left;  . AMPUTATION Left 03/07/2015   Procedure: Left Foot 1st and 2nd Ray Amputation;  Surgeon: Newt Minion, MD;  Location: [facility] [place];  Service: Orthopedics;  Laterality: Left;  . AMPUTATION Left 04/14/2015   Procedure: LEFT BELOW THE KNEE AMPUTATION ;  Surgeon: Newt Minion, MD;  Location: [facility];  Service: Orthopedics;  Laterality: Left;  . ANKLE FUSION     rt foot charcot joint 2008 with refusion  . AV FISTULA PLACEMENT  10/11/2010  . AV FISTULA PLACEMENT  07/02/2011   Procedure: INSERTION OF ARTERIOVENOUS (AV) GORE-TEX GRAFT ARM;  Surgeon: Elam Dutch, MD;  Location: [facility];  Service: Vascular;  Laterality: Left;  . AV FISTULA PLACEMENT Right 11/06/2012   Procedure: INSERTION OF ARTERIOVENOUS (AV) GORE-TEX GRAFT ARM;  Surgeon: Mal Misty, MD;  Location: [facility];  Service: Vascular;  Laterality: Right;  . AV FISTULA PLACEMENT Right 03/21/2016   Procedure: INSERTION OF ARTERIOVENOUS (AV) GORE-TEX GRAFT ARM ( LOOP FOREARM );  Surgeon: Algernon Huxley, MD;  Location: [facility];  Service: Vascular;  Laterality: Right;  . [place] REMOVAL Right 11/06/2012   Procedure: REMOVAL OF ARTERIOVENOUS GORETEX GRAFT ([place]);  Surgeon: Mal Misty, MD;  Location: [facility];  Service: Vascular;  Laterality: Right;  . INSERTION OF DIALYSIS CATHETER  01/20/2012   Procedure: INSERTION OF DIALYSIS CATHETER;  Surgeon: Serafina Mitchell, MD;  [facility];  Service: Vascular;  Laterality: N/A;  insertion left internal jugular dialysis catheter 23cm  . INSERTION OF DIALYSIS CATHETER Right 11/09/2012   Procedure: INSERTION OF DIALYSIS CATHETER;  Surgeon: Conrad [place], MD;  Location: [facility];  Service: Vascular;  Laterality: Right;  Right  Femoral Vein  . INSERTION OF DIALYSIS CATHETER N/A 08/18/2015   Procedure: INSERTION OF DIALYSIS CATHETER RIGHT FEMORAL VEIN;  Surgeon: Mal Misty, MD;  Location: [facility];  Service: Cardiovascular;  Laterality: N/A;  . PARATHYROIDECTOMY    . PERIPHERAL VASCULAR CATHETERIZATION Right 04/04/2016   Procedure: A/V Shuntogram/Fistulagram;   Surgeon: Algernon Huxley, MD;  Location: [facility];  Service: Cardiovascular;  Laterality: Right;  . REMOVAL OF A HERO DEVICE Left 08/14/2015   Procedure: REMOVAL OF LEFT ARM HERO DEVICE;  Surgeon: Rosetta Posner, MD;  Location: [place];  Service: Vascular;  Laterality: Left;  . TEE WITHOUT CARDIOVERSION N/A 08/18/2015   Procedure: TRANSESOPHAGEAL ECHOCARDIOGRAM (TEE);  Surgeon: Mal Misty, MD;  Location: [place];  Service: Cardiovascular;  Laterality: N/A;  . TEE WITHOUT CARDIOVERSION N/A 12/21/2015   Procedure: TRANSESOPHAGEAL ECHOCARDIOGRAM (TEE);  Surgeon: Dixie Dials, MD;  Location: [facility];  Service: Cardiovascular;  Laterality: N/A;  . THROMBECTOMY    . Umbilcial Hernia    . VENOGRAM N/A 01/06/2014   Procedure: VENOGRAM;  Surgeon: Conrad [place], MD;  Location: [facility];  Service: Cardiovascular;  Laterality: N/A;       Home Medications    Prior to Admission medications   Medication Sig Start Date End Date Taking? Authorizing Provider  aspirin EC 81 MG EC tablet Take 1 tablet (81 mg total) by mouth daily. 08/19/15   Florinda Marker, MD  b complex-vitamin c-folic acid (NEPHRO-VITE) 0.8 MG TABS tablet Take 1 tablet by mouth at bedtime. 04/26/15   Lavon Paganini Angiulli, PA-C  calcitRIOL (ROCALTROL) 0.5 MCG capsule Take 5 capsules (2.5 mcg total) by mouth every Monday, Wednesday, and Friday with hemodialysis. 04/26/15   Lavon Paganini Angiulli, PA-C  calcium carbonate (TUMS - DOSED IN MG ELEMENTAL CALCIUM) 500 MG chewable tablet Chew 4 tablets (800 mg of elemental calcium total) by mouth 3 (three) times daily with meals. 04/26/15   Lavon Paganini Angiulli, PA-C  cephALEXin (KEFLEX) 500 MG capsule Take 1 capsule (500 mg total) by mouth 4 (four) times daily. Patient not taking: Reported on 05/23/2016 12/06/15   [facility], PA-C  famotidine (PEPCID) 20 MG tablet Take 1 tablet (20 mg total) by mouth at bedtime. Patient taking differently: Take 20 mg by mouth daily as needed.  04/26/15   Lavon Paganini  Angiulli, PA-C  insulin detemir (LEVEMIR) 100 UNIT/ML injection Inject 0.2 mLs (20 Units total) into the skin at bedtime. 04/26/15   Lavon Paganini Angiulli, PA-C  oxyCODONE-acetaminophen (PERCOCET) 7.5-325 MG tablet Take 1 tablet by mouth every 4 (four) hours as needed for severe pain. 03/21/16   Algernon Huxley, MD  vancomycin (VANCOCIN) 1 GM/200ML SOLN Inject 200 mLs (1,000 mg total) into the vein every Monday, Wednesday, and Friday with hemodialysis. Stop Date: 09/27/15 as last dose Patient not taking: Reported on 05/23/2016 08/19/15   Florinda Marker, MD    Family History Family History  Problem Relation Age of Onset  . Heart disease Mother   . Kidney disease Mother   . Hypertension Mother     Social History Social History  Substance Use Topics  . Smoking status: Current Every Day Smoker  Packs/day: 0.10    Years: 20.00    Types: Cigarettes    Last attempt to quit: 01/13/2015  . Smokeless tobacco: Never Used  . Alcohol use No     Allergies   Patient has no known allergies.   Review of Systems Review of Systems  Constitutional: Negative for fever.  Respiratory: Negative for shortness of breath.   Cardiovascular: Negative for chest pain.  Musculoskeletal: Negative for myalgias.  Skin: Positive for wound.     Physical Exam Updated Vital Signs BP 170/98 (BP Location: Right Arm)   Pulse 110   Temp 98.5 F (36.9 C) (Oral)   Resp 16   Ht 5\' 9"  (1.753 m)   Wt 117.9 kg   SpO2 100%   BMI 38.40 kg/m   Physical Exam  Constitutional: He is oriented to person, place, and time. He appears well-developed and well-nourished. No distress.  Neck: Normal range of motion.  Cardiovascular: Intact distal pulses.   Pulmonary/Chest: Effort normal.  Musculoskeletal: Normal range of motion.  Neurological: He is alert and oriented to person, place, and time.  Skin: Skin is warm and dry.  4 cm x 3 cm area induration with central ulceration. No active drainage. Localized erythema only.       ED Treatments / Results  Labs (all labs ordered are listed, but only abnormal results are displayed) Labs Reviewed  CBG MONITORING, ED - Abnormal; Notable for the following:       Result Value   Glucose-Capillary 288 (*)    All other components within normal limits    EKG  EKG Interpretation None       Radiology No results found.  Procedures Procedures (including critical care time) INCISION AND DRAINAGE Performed by: Charlann Lange A Consent: Verbal consent obtained. Risks and benefits: risks, benefits and alternatives were discussed Type: abscess  Body area: right shoulder  Anesthesia: local infiltration  Incision was made with a scalpel.  Local anesthetic: lidocaine 2% w/epinephrine  Anesthetic total: 3 ml  Complexity: complex Blunt dissection to break up loculations  Drainage: purulent  Drainage amount: moderate  Packing material: 1/4 in iodoform gauze  Patient tolerance: Patient tolerated the procedure well with no immediate complications.    Medications Ordered in ED Medications  lidocaine-EPINEPHrine (XYLOCAINE W/EPI) 2 %-1:200000 (PF) injection 10 mL (not administered)     Initial Impression / Assessment and Plan / ED Course  I have reviewed the triage vital signs and the nursing notes.  Pertinent labs & imaging results that were available during my care of the patient were reviewed by me and considered in my medical decision making (see chart for details).     Patient presents with cutaneous abscess requiring I&D as per above note. No evidence widespread infection.   Final Clinical Impressions(s) / ED Diagnoses   Final diagnoses:  None   1. Cutaneous abscess, right shoulder  New Prescriptions New Prescriptions   No medications on file     Charlann Lange, PA-C 09/09/16 [id number]    Varney Biles, MD 09/09/16 [id number]

## 2016-09-09 NOTE — ED Triage Notes (Signed)
abscess on right shoulder for about 3 days and getting worse no fever with pain. Pt is diabetic and dialysis pt.

## 2016-09-09 NOTE — ED Notes (Signed)
ED Provider at bedside to perform I&D of abscess.

## 2016-09-11 DIAGNOSIS — E209 Hypoparathyroidism, unspecified: Secondary | ICD-10-CM | POA: Diagnosis not present

## 2016-09-11 DIAGNOSIS — E119 Type 2 diabetes mellitus without complications: Secondary | ICD-10-CM | POA: Diagnosis not present

## 2016-09-11 DIAGNOSIS — Z992 Dependence on renal dialysis: Secondary | ICD-10-CM | POA: Diagnosis not present

## 2016-09-11 DIAGNOSIS — D689 Coagulation defect, unspecified: Secondary | ICD-10-CM | POA: Diagnosis not present

## 2016-09-11 DIAGNOSIS — D509 Iron deficiency anemia, unspecified: Secondary | ICD-10-CM | POA: Diagnosis not present

## 2016-09-11 DIAGNOSIS — D631 Anemia in chronic kidney disease: Secondary | ICD-10-CM | POA: Diagnosis not present

## 2016-09-11 DIAGNOSIS — N186 End stage renal disease: Secondary | ICD-10-CM | POA: Diagnosis not present

## 2016-09-11 DIAGNOSIS — E1129 Type 2 diabetes mellitus with other diabetic kidney complication: Secondary | ICD-10-CM | POA: Diagnosis not present

## 2016-09-13 DIAGNOSIS — E209 Hypoparathyroidism, unspecified: Secondary | ICD-10-CM | POA: Diagnosis not present

## 2016-09-13 DIAGNOSIS — E119 Type 2 diabetes mellitus without complications: Secondary | ICD-10-CM | POA: Diagnosis not present

## 2016-09-13 DIAGNOSIS — D689 Coagulation defect, unspecified: Secondary | ICD-10-CM | POA: Diagnosis not present

## 2016-09-13 DIAGNOSIS — D509 Iron deficiency anemia, unspecified: Secondary | ICD-10-CM | POA: Diagnosis not present

## 2016-09-13 DIAGNOSIS — N186 End stage renal disease: Secondary | ICD-10-CM | POA: Diagnosis not present

## 2016-09-13 DIAGNOSIS — N2581 Secondary hyperparathyroidism of renal origin: Secondary | ICD-10-CM | POA: Diagnosis not present

## 2016-09-13 DIAGNOSIS — D631 Anemia in chronic kidney disease: Secondary | ICD-10-CM | POA: Diagnosis not present

## 2016-09-16 DIAGNOSIS — E119 Type 2 diabetes mellitus without complications: Secondary | ICD-10-CM | POA: Diagnosis not present

## 2016-09-16 DIAGNOSIS — D689 Coagulation defect, unspecified: Secondary | ICD-10-CM | POA: Diagnosis not present

## 2016-09-16 DIAGNOSIS — D631 Anemia in chronic kidney disease: Secondary | ICD-10-CM | POA: Diagnosis not present

## 2016-09-16 DIAGNOSIS — E209 Hypoparathyroidism, unspecified: Secondary | ICD-10-CM | POA: Diagnosis not present

## 2016-09-16 DIAGNOSIS — D509 Iron deficiency anemia, unspecified: Secondary | ICD-10-CM | POA: Diagnosis not present

## 2016-09-16 DIAGNOSIS — N186 End stage renal disease: Secondary | ICD-10-CM | POA: Diagnosis not present

## 2016-09-18 DIAGNOSIS — N186 End stage renal disease: Secondary | ICD-10-CM | POA: Diagnosis not present

## 2016-09-18 DIAGNOSIS — E209 Hypoparathyroidism, unspecified: Secondary | ICD-10-CM | POA: Diagnosis not present

## 2016-09-18 DIAGNOSIS — D689 Coagulation defect, unspecified: Secondary | ICD-10-CM | POA: Diagnosis not present

## 2016-09-18 DIAGNOSIS — D509 Iron deficiency anemia, unspecified: Secondary | ICD-10-CM | POA: Diagnosis not present

## 2016-09-18 DIAGNOSIS — E119 Type 2 diabetes mellitus without complications: Secondary | ICD-10-CM | POA: Diagnosis not present

## 2016-09-18 DIAGNOSIS — D631 Anemia in chronic kidney disease: Secondary | ICD-10-CM | POA: Diagnosis not present

## 2016-09-20 DIAGNOSIS — E209 Hypoparathyroidism, unspecified: Secondary | ICD-10-CM | POA: Diagnosis not present

## 2016-09-20 DIAGNOSIS — E119 Type 2 diabetes mellitus without complications: Secondary | ICD-10-CM | POA: Diagnosis not present

## 2016-09-20 DIAGNOSIS — D689 Coagulation defect, unspecified: Secondary | ICD-10-CM | POA: Diagnosis not present

## 2016-09-20 DIAGNOSIS — N186 End stage renal disease: Secondary | ICD-10-CM | POA: Diagnosis not present

## 2016-09-20 DIAGNOSIS — D631 Anemia in chronic kidney disease: Secondary | ICD-10-CM | POA: Diagnosis not present

## 2016-09-20 DIAGNOSIS — D509 Iron deficiency anemia, unspecified: Secondary | ICD-10-CM | POA: Diagnosis not present

## 2016-09-23 DIAGNOSIS — D689 Coagulation defect, unspecified: Secondary | ICD-10-CM | POA: Diagnosis not present

## 2016-09-23 DIAGNOSIS — N186 End stage renal disease: Secondary | ICD-10-CM | POA: Diagnosis not present

## 2016-09-23 DIAGNOSIS — D509 Iron deficiency anemia, unspecified: Secondary | ICD-10-CM | POA: Diagnosis not present

## 2016-09-23 DIAGNOSIS — D631 Anemia in chronic kidney disease: Secondary | ICD-10-CM | POA: Diagnosis not present

## 2016-09-23 DIAGNOSIS — E209 Hypoparathyroidism, unspecified: Secondary | ICD-10-CM | POA: Diagnosis not present

## 2016-09-23 DIAGNOSIS — E119 Type 2 diabetes mellitus without complications: Secondary | ICD-10-CM | POA: Diagnosis not present

## 2016-09-24 ENCOUNTER — Ambulatory Visit (INDEPENDENT_AMBULATORY_CARE_PROVIDER_SITE_OTHER): Payer: Medicare Other | Admitting: Vascular Surgery

## 2016-09-25 DIAGNOSIS — N186 End stage renal disease: Secondary | ICD-10-CM | POA: Diagnosis not present

## 2016-09-25 DIAGNOSIS — D509 Iron deficiency anemia, unspecified: Secondary | ICD-10-CM | POA: Diagnosis not present

## 2016-09-25 DIAGNOSIS — E209 Hypoparathyroidism, unspecified: Secondary | ICD-10-CM | POA: Diagnosis not present

## 2016-09-25 DIAGNOSIS — E119 Type 2 diabetes mellitus without complications: Secondary | ICD-10-CM | POA: Diagnosis not present

## 2016-09-25 DIAGNOSIS — D631 Anemia in chronic kidney disease: Secondary | ICD-10-CM | POA: Diagnosis not present

## 2016-09-25 DIAGNOSIS — D689 Coagulation defect, unspecified: Secondary | ICD-10-CM | POA: Diagnosis not present

## 2016-09-26 DIAGNOSIS — M25571 Pain in right ankle and joints of right foot: Secondary | ICD-10-CM | POA: Diagnosis not present

## 2016-09-26 DIAGNOSIS — M545 Low back pain: Secondary | ICD-10-CM | POA: Diagnosis not present

## 2016-09-26 DIAGNOSIS — G546 Phantom limb syndrome with pain: Secondary | ICD-10-CM | POA: Diagnosis not present

## 2016-09-26 DIAGNOSIS — G8929 Other chronic pain: Secondary | ICD-10-CM | POA: Diagnosis not present

## 2016-09-27 DIAGNOSIS — D631 Anemia in chronic kidney disease: Secondary | ICD-10-CM | POA: Diagnosis not present

## 2016-09-27 DIAGNOSIS — N186 End stage renal disease: Secondary | ICD-10-CM | POA: Diagnosis not present

## 2016-09-27 DIAGNOSIS — E209 Hypoparathyroidism, unspecified: Secondary | ICD-10-CM | POA: Diagnosis not present

## 2016-09-27 DIAGNOSIS — E119 Type 2 diabetes mellitus without complications: Secondary | ICD-10-CM | POA: Diagnosis not present

## 2016-09-27 DIAGNOSIS — D509 Iron deficiency anemia, unspecified: Secondary | ICD-10-CM | POA: Diagnosis not present

## 2016-09-27 DIAGNOSIS — D689 Coagulation defect, unspecified: Secondary | ICD-10-CM | POA: Diagnosis not present

## 2016-09-30 DIAGNOSIS — D509 Iron deficiency anemia, unspecified: Secondary | ICD-10-CM | POA: Diagnosis not present

## 2016-09-30 DIAGNOSIS — N186 End stage renal disease: Secondary | ICD-10-CM | POA: Diagnosis not present

## 2016-09-30 DIAGNOSIS — D689 Coagulation defect, unspecified: Secondary | ICD-10-CM | POA: Diagnosis not present

## 2016-09-30 DIAGNOSIS — E119 Type 2 diabetes mellitus without complications: Secondary | ICD-10-CM | POA: Diagnosis not present

## 2016-09-30 DIAGNOSIS — D631 Anemia in chronic kidney disease: Secondary | ICD-10-CM | POA: Diagnosis not present

## 2016-09-30 DIAGNOSIS — E209 Hypoparathyroidism, unspecified: Secondary | ICD-10-CM | POA: Diagnosis not present

## 2016-10-01 ENCOUNTER — Ambulatory Visit (INDEPENDENT_AMBULATORY_CARE_PROVIDER_SITE_OTHER): Payer: Medicare Other | Admitting: Vascular Surgery

## 2016-10-02 DIAGNOSIS — D689 Coagulation defect, unspecified: Secondary | ICD-10-CM | POA: Diagnosis not present

## 2016-10-02 DIAGNOSIS — D631 Anemia in chronic kidney disease: Secondary | ICD-10-CM | POA: Diagnosis not present

## 2016-10-02 DIAGNOSIS — D509 Iron deficiency anemia, unspecified: Secondary | ICD-10-CM | POA: Diagnosis not present

## 2016-10-02 DIAGNOSIS — E209 Hypoparathyroidism, unspecified: Secondary | ICD-10-CM | POA: Diagnosis not present

## 2016-10-02 DIAGNOSIS — E119 Type 2 diabetes mellitus without complications: Secondary | ICD-10-CM | POA: Diagnosis not present

## 2016-10-02 DIAGNOSIS — N186 End stage renal disease: Secondary | ICD-10-CM | POA: Diagnosis not present

## 2016-10-04 DIAGNOSIS — D689 Coagulation defect, unspecified: Secondary | ICD-10-CM | POA: Diagnosis not present

## 2016-10-04 DIAGNOSIS — N186 End stage renal disease: Secondary | ICD-10-CM | POA: Diagnosis not present

## 2016-10-04 DIAGNOSIS — E209 Hypoparathyroidism, unspecified: Secondary | ICD-10-CM | POA: Diagnosis not present

## 2016-10-04 DIAGNOSIS — D631 Anemia in chronic kidney disease: Secondary | ICD-10-CM | POA: Diagnosis not present

## 2016-10-04 DIAGNOSIS — E119 Type 2 diabetes mellitus without complications: Secondary | ICD-10-CM | POA: Diagnosis not present

## 2016-10-04 DIAGNOSIS — D509 Iron deficiency anemia, unspecified: Secondary | ICD-10-CM | POA: Diagnosis not present

## 2016-10-07 DIAGNOSIS — N186 End stage renal disease: Secondary | ICD-10-CM | POA: Diagnosis not present

## 2016-10-07 DIAGNOSIS — E119 Type 2 diabetes mellitus without complications: Secondary | ICD-10-CM | POA: Diagnosis not present

## 2016-10-07 DIAGNOSIS — E209 Hypoparathyroidism, unspecified: Secondary | ICD-10-CM | POA: Diagnosis not present

## 2016-10-07 DIAGNOSIS — D689 Coagulation defect, unspecified: Secondary | ICD-10-CM | POA: Diagnosis not present

## 2016-10-07 DIAGNOSIS — D509 Iron deficiency anemia, unspecified: Secondary | ICD-10-CM | POA: Diagnosis not present

## 2016-10-07 DIAGNOSIS — D631 Anemia in chronic kidney disease: Secondary | ICD-10-CM | POA: Diagnosis not present

## 2016-10-08 ENCOUNTER — Encounter (INDEPENDENT_AMBULATORY_CARE_PROVIDER_SITE_OTHER): Payer: Self-pay

## 2016-10-08 ENCOUNTER — Ambulatory Visit (INDEPENDENT_AMBULATORY_CARE_PROVIDER_SITE_OTHER): Payer: Medicare Other | Admitting: Vascular Surgery

## 2016-10-08 ENCOUNTER — Encounter (INDEPENDENT_AMBULATORY_CARE_PROVIDER_SITE_OTHER): Payer: Self-pay | Admitting: Vascular Surgery

## 2016-10-08 VITALS — BP 132/83 | HR 72 | Resp 18 | Ht 72.0 in | Wt 260.0 lb

## 2016-10-08 DIAGNOSIS — Z89512 Acquired absence of left leg below knee: Secondary | ICD-10-CM | POA: Diagnosis not present

## 2016-10-08 DIAGNOSIS — Z794 Long term (current) use of insulin: Secondary | ICD-10-CM | POA: Diagnosis not present

## 2016-10-08 DIAGNOSIS — N186 End stage renal disease: Secondary | ICD-10-CM

## 2016-10-08 DIAGNOSIS — E1165 Type 2 diabetes mellitus with hyperglycemia: Secondary | ICD-10-CM | POA: Diagnosis not present

## 2016-10-08 DIAGNOSIS — E1122 Type 2 diabetes mellitus with diabetic chronic kidney disease: Secondary | ICD-10-CM

## 2016-10-08 DIAGNOSIS — Z992 Dependence on renal dialysis: Secondary | ICD-10-CM

## 2016-10-08 DIAGNOSIS — N185 Chronic kidney disease, stage 5: Secondary | ICD-10-CM | POA: Diagnosis not present

## 2016-10-08 DIAGNOSIS — IMO0002 Reserved for concepts with insufficient information to code with codable children: Secondary | ICD-10-CM

## 2016-10-08 NOTE — Assessment & Plan Note (Signed)
If we do end up having to go to a thigh access, his left leg would be preferred as we would not have to be as concerned with steal syndrome.

## 2016-10-08 NOTE — Assessment & Plan Note (Signed)
He is still using a right thigh catheter for his dialysis. He will have essentially exhausted his right arm access options at this point. He has an amputation of the left leg. He has had multiple failed dialysis accesses on the left arm. This is a very difficult access situation. I do not think he has any right arm access options remaining. At this point, I think we are deciding between a left arm access and a left thigh access which be ipsilateral to his amputation site lowering the risk of steal. I think a venogram of the left upper extremity would be helpful to discern what options are available in the left arm. If there are no reasonable options remaining, a left thigh graft would then be placed. Venogram will be done in the near future at his convenience.

## 2016-10-08 NOTE — Assessment & Plan Note (Signed)
blood glucose control important in reducing the progression of atherosclerotic disease. Also, involved in wound healing. On appropriate medications.  

## 2016-10-08 NOTE — Progress Notes (Signed)
MRN : [id number]  [name] is a 51 y.o. (February 29, 1966) male who presents with chief complaint of  Chief Complaint  Patient presents with  . Re-evaluation    Follow up for AVG placement  .  History of Present Illness: Patient returns today in follow up of difficult dialysis access on referral from his Nephrologist.He had a right arm AV graft placed last year which only lasted a few months. 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 He is still using a right thigh catheter for his dialysis. He will have essentially exhausted his right arm access options at this point. He has an amputation of the left leg. He has had multiple failed dialysis accesses on the left arm.  Current Outpatient Prescriptions  Medication Sig Dispense Refill  . aspirin EC 81 MG EC tablet Take 1 tablet (81 mg total) by mouth daily. 30 tablet 11  . b complex-vitamin c-folic acid (NEPHRO-VITE) 0.8 MG TABS tablet Take 1 tablet by mouth at bedtime. 30 tablet 0  . calcitRIOL (ROCALTROL) 0.5 MCG capsule Take 5 capsules (2.5 mcg total) by mouth every Monday, Wednesday, and Friday with hemodialysis. 30 capsule 0  . calcium acetate (PHOSLO) 667 MG capsule     . calcium carbonate (TUMS - DOSED IN MG ELEMENTAL CALCIUM) 500 MG chewable tablet Chew 4 tablets (800 mg of elemental calcium total) by mouth 3 (three) times daily with meals.    . cephALEXin (KEFLEX) 500 MG capsule Take 1 capsule (500 mg total) by mouth 4 (four) times daily. 28 capsule 0  . doxycycline (VIBRAMYCIN) 100 MG capsule Take 1 capsule (100 mg total) by mouth 2 (two) times daily. 20 capsule 0  . famotidine (PEPCID) 20 MG tablet Take 1 tablet (20 mg total) by mouth at bedtime. (Patient taking differently: Take 20 mg by mouth daily as needed. ) 30 tablet 0  . insulin detemir (LEVEMIR) 100 UNIT/ML injection Inject 0.2 mLs (20 Units total) into the skin at bedtime. 10 mL 11  .  oxyCODONE-acetaminophen (PERCOCET/ROXICET) 5-325 MG tablet Take 1 tablet by mouth every 4 (four) hours as needed for severe Jefferson. 8 tablet 0   No current facility-administered medications for this visit.     Past Medical History:  Diagnosis Date  . Anemia   . Blood transfusion   . Bronchitis   . Chest Jefferson   . Chronic kidney disease    d[facility], [facility]  . Diabetes mellitus    type 2  . Dysrhythmia    irregular heartbeat  . ESRD (end stage renal disease) on dialysis ([place])    first HD 06/19/04  . GERD (gastroesophageal reflux disease)   . H/O hiatal hernia   . Hyperparathyroidism ([place])   . Hypertension   . Morbid obesity ([place])   . Pneumonia     Past Surgical History:  Procedure Laterality Date  . AMPUTATION Left 01/20/2015   Procedure: Left Great Toe Amputation;  Surgeon: Newt Minion, MD;  Location: [place];  Service: Orthopedics;  Laterality: Left;  . AMPUTATION Left 03/07/2015   Procedure: Left Foot 1st and 2nd Ray Amputation;  Surgeon: Newt Minion, MD;  Location: [place];  Service: Orthopedics;  Laterality: Left;  . AMPUTATION Left 04/14/2015   Procedure: LEFT BELOW THE KNEE AMPUTATION ;  Surgeon: Newt Minion, MD;  Location: [place];  Service: Orthopedics;  Laterality: Left;  .  ANKLE FUSION     rt foot charcot joint 2008 with refusion  . AV FISTULA PLACEMENT  10/11/2010  . AV FISTULA PLACEMENT  07/02/2011   Procedure: INSERTION OF ARTERIOVENOUS (AV) GORE-TEX GRAFT ARM;  Surgeon: Elam Dutch, MD;  Location: [place];  Service: Vascular;  Laterality: Left;  . AV FISTULA PLACEMENT Right 11/06/2012   Procedure: INSERTION OF ARTERIOVENOUS (AV) GORE-TEX GRAFT ARM;  Surgeon: Mal Misty, MD;  Location: [place];  Service: Vascular;  Laterality: Right;  . AV FISTULA PLACEMENT Right 03/21/2016   Procedure: INSERTION OF ARTERIOVENOUS (AV) GORE-TEX GRAFT ARM ( LOOP FOREARM );  Surgeon: Algernon Huxley, MD;  Location: [facility];  Service: Vascular;  Laterality: Right;  . [place] REMOVAL  Right 11/06/2012   Procedure: REMOVAL OF ARTERIOVENOUS GORETEX GRAFT ([place]);  Surgeon: Mal Misty, MD;  Location: [place];  Service: Vascular;  Laterality: Right;  . INSERTION OF DIALYSIS CATHETER  01/20/2012   Procedure: INSERTION OF DIALYSIS CATHETER;  Surgeon: Serafina Mitchell, MD;  Location: [place];  Service: Vascular;  Laterality: N/A;  insertion left internal jugular dialysis catheter 23cm  . INSERTION OF DIALYSIS CATHETER Right 11/09/2012   Procedure: INSERTION OF DIALYSIS CATHETER;  Surgeon: Conrad [place], MD;  Location: [place];  Service: Vascular;  Laterality: Right;  Right  Femoral Vein  . INSERTION OF DIALYSIS CATHETER N/A 08/18/2015   Procedure: INSERTION OF DIALYSIS CATHETER RIGHT FEMORAL VEIN;  Surgeon: Mal Misty, MD;  Location: [place];  Service: Cardiovascular;  Laterality: N/A;  . PARATHYROIDECTOMY    . PERIPHERAL VASCULAR CATHETERIZATION Right 04/04/2016   Procedure: A/V Shuntogram/Fistulagram;  Surgeon: Algernon Huxley, MD;  Location: [place] [facility];  Service: Cardiovascular;  Laterality: Right;  . REMOVAL OF A HERO DEVICE Left 08/14/2015   Procedure: REMOVAL OF LEFT ARM HERO DEVICE;  Surgeon: Rosetta Posner, MD;  Location: [place];  Service: Vascular;  Laterality: Left;  . TEE WITHOUT CARDIOVERSION N/A 08/18/2015   Procedure: TRANSESOPHAGEAL ECHOCARDIOGRAM (TEE);  Surgeon: Mal Misty, MD;  Location: [place];  Service: Cardiovascular;  Laterality: N/A;  . TEE WITHOUT CARDIOVERSION N/A 12/21/2015   Procedure: TRANSESOPHAGEAL ECHOCARDIOGRAM (TEE);  Surgeon: Dixie Dials, MD;  Location: [facility]OSCOPY;  Service: Cardiovascular;  Laterality: N/A;  . THROMBECTOMY    . Umbilcial Hernia    . VENOGRAM N/A 01/06/2014   Procedure: VENOGRAM;  Surgeon: Conrad , MD;  Location: [facility] C[facility] LAB;  Service: Cardiovascular;  Laterality: N/A;    Social History Social History  Substance Use Topics  . Smoking status: Current Every Day Smoker    Packs/day: 0.10    Years: 20.00    Types: Cigarettes     Last attempt to quit: 01/13/2015  . Smokeless tobacco: Never Used  . Alcohol use No    Family History Family History  Problem Relation Age of Onset  . Heart disease Mother   . Kidney disease Mother   . Hypertension Mother     No Known Allergies   REVIEW OF SYSTEMS (Negative unless checked)  Constitutional: [] Weight loss  [] Fever  [] Chills Cardiac: [] Chest Jefferson   [] Chest pressure   [] Palpitations   [] Shortness of breath when laying flat   [] Shortness of breath at rest   [] Shortness of breath with exertion. Vascular:  [] Jefferson in legs with walking   [] Jefferson in legs at rest   [] Jefferson in legs when laying flat   [] Claudication   [] Jefferson in feet when walking  [] Jefferson in  feet at rest  [] Jefferson in feet when laying flat   [] History of DVT   [] Phlebitis   [x] Swelling in legs   [] Varicose veins   [] Non-healing ulcers Pulmonary:   [] Uses home oxygen   [] Productive cough   [] Hemoptysis   [] Wheeze  [] COPD   [] Asthma Neurologic:  [] Dizziness  [] Blackouts   [] Seizures   [] History of stroke   [] History of TIA  [] Aphasia   [] Temporary blindness   [] Dysphagia   [] Weakness or numbness in arms   [] Weakness or numbness in legs Musculoskeletal:  [] Arthritis   [] Joint swelling   [] Joint Jefferson   [] Low back Jefferson Hematologic:  [] Easy bruising  [] Easy bleeding   [] Hypercoagulable state   [] Anemic   Gastrointestinal:  [] Blood in stool   [] Vomiting blood  [] Gastroesophageal reflux/heartburn   [] Abdominal Jefferson Genitourinary:  [x] Chronic kidney disease   [] Difficult urination  [] Frequent urination  [] Burning with urination   [] Hematuria Skin:  [] Rashes   [] Ulcers   [] Wounds Psychological:  [] History of anxiety   []  History of major depression.  Physical Examination  BP 132/83 (BP Location: Left Arm)   Pulse 72   Resp 18   Ht 6' (1.829 m)   Wt 260 lb (117.9 kg)   BMI 35.26 kg/m  Gen:  WD/WN, NAD Head: /AT, No temporalis wasting. Ear/Nose/Throat: Hearing grossly intact, nares w/o erythema or drainage, trachea  midline Eyes: Conjunctiva clear. Sclera non-icteric Neck: Supple.  No JVD.  Pulmonary:  Good air movement, no use of accessory muscles.  Cardiac: RRR, normal S1, S2 Vascular: Multiple scars from failed dialysis access procedures in both upper extremities. Right thigh PermCath is in place. Vessel Right Left  Radial Palpable Palpable                                   Gastrointestinal: soft, non-tender/non-distended Musculoskeletal: M/S 5/5 throughout.  Left leg prosthesis in place status post amputation. Neurologic: Sensation grossly intact in extremities.  Symmetrical.  Speech is fluent.  Psychiatric: Judgment intact, Mood & affect appropriate for pt's clinical situation. Dermatologic: No rashes or ulcers noted.  No cellulitis or open wounds. Lymph : No Cervical, Axillary, or Inguinal lymphadenopathy.      Labs Recent Results (from the past 2160 hour(s))  CBG monitoring, ED     Status: Abnormal   Collection Time: 09/09/16  2:38 AM  Result Value Ref Range   Glucose-Capillary 288 (H) 65 - 99 mg/dL    Radiology No results found.   Assessment/Plan  Status post below knee amputation of left lower extremity ([place]) If we do end up having to go to a thigh access, his left leg would be preferred as we would not have to be as concerned with steal syndrome.  ESRD on dialysis [facility]) He is still using a right thigh catheter for his dialysis. He will have essentially exhausted his right arm access options at this point. He has an amputation of the left leg. He has had multiple failed dialysis accesses on the left arm. This is a very difficult access situation. I do not think he has any right arm access options remaining. At this point, I think we are deciding between a left arm access and a left thigh access which be ipsilateral to his amputation site lowering the risk of steal. I think a venogram of the left upper extremity would be helpful to discern what options are available in the  left  arm. If there are no reasonable options remaining, a left thigh graft would then be placed. Venogram will be done in the near future at his convenience.  Diabetes mellitus type 2, uncontrolled (HCC) blood glucose control important in reducing the progression of atherosclerotic disease. Also, involved in wound healing. On appropriate medications.     Ricky Pain, MD  10/08/2016 11:09 AM    This note was created with Dragon medical transcription system.  Any errors from dictation are purely unintentional

## 2016-10-08 NOTE — Patient Instructions (Signed)
End-Stage Kidney Disease °End-stage kidney disease occurs when the kidneys are so damaged that they cannot do their job. The kidneys are two organs that do many important jobs in the body, which include: °· Removing wastes and extra fluids from the blood. °· Making hormones that maintain the amount of fluid in your tissues and blood vessels. °· Maintaining the right amount of fluids and chemicals in the body. ° °When the kidneys are damaged and cannot do their job, life-threatening problems occur. Without the help of the kidneys, toxins build up in the blood. In end-stage kidney disease, the kidneys cannot get better. °What are the causes? °End-stage kidney disease usually occurs when a long-lasting (chronic) kidney disease gets worse. It may also occur after the kidneys are suddenly damaged (acute kidney injury). °What increases the risk? °This condition is more likely to develop in people who are: °· Older than age 60. °· Male. °· Of African-American descent. °· Current smokers or former smokers. °· Obese. ° °You may also have an increased risk for end-stage kidney disease if you: °· Have a family history of chronic kidney disease (CKD). °· Have had kidney disease for many years. °· Have other longstanding medical conditions that affect the kidneys, such as: °? Cardiovascular disease including high blood pressure. °? Diabetes. °? Certain diseases that affect the immune system. ° °What are the signs or symptoms? °· Swelling (edema) of the face, legs, ankles, or feet. °· Numbness, tingling, or loss of feeling (sensation) in your hands or feet. °· Tiredness (lethargy). °· Nausea or vomiting. °· Confusion, trouble concentrating, or loss of consciousness. °· Chest pain. °· Shortness of breath. °· Little to no urine production. °· Muscle twitches and cramps, especially in the legs. °· Constant itchiness. °· Loss of appetite. °· Pale skin and tissue lining your eyelids (conjunctiva). °· Headaches. °· Abnormally dark or  light skin. °· Decrease in muscle size (muscle wasting). °· Easy bruising. °· Frequent hiccups. °· Stopping of menstruation in women. °· Seizures. °How is this diagnosed? °Your health care provider will measure your blood pressure and do some tests. These may include: °· Urine tests. °· Blood tests. °· Imaging tests. °· A test in which a sample of tissue is removed from the kidneys to be looked at under a microscope (kidney biopsy). ° °How is this treated? °There are two treatments for end-stage kidney disease: °· A procedure that removes toxic wastes from the body (dialysis). Depending on the type of dialysis you choose, it may be performed more than one time a day (peritoneal dialysis) or several times a week (hemodialysis). °· Surgery to receive a new kidney (kidney transplant). ° °In addition to having dialysis or a kidney transplant, you may need to take medicines: °· To control high blood pressure (hypertension). °· To control cholesterol. °· To maintain healthy electrolyte levels in your blood. ° °You may also be given a specific diet to follow that includes requirements or limits for: °· Salt (sodium). °· Protein. °· Phosphorous. °· Potassium. °· Calcium. ° °Follow these instructions at home: °· Follow your prescribed diet. °· Take over-the-counter and prescription medicines only as told by your health care provider. °? Do not take any new medicines unless approved by your health care provider. Many medicines can worsen your kidney damage. °? Do not take any vitamin and mineral supplements unless approved by your health care provider. Many nutritional supplements can worsen your kidney damage. °? The dose of some medicines that you take may need to be   adjusted. °· Do not use any tobacco products, such as cigarettes, chewing tobacco, and e-cigarettes. If you need help quitting, ask your health care provider. °· Keep all follow-up visits as told by your health care provider. This is important. °· Keep track of  your blood pressure. Report changes in your blood pressure as told by your health care provider. °· Achieve and maintain a healthy weight. If you need help with this, ask your health care provider. °· Start or continue an exercise plan. Try to exercise at least 30 minutes a day, 5 days a week. °· Stay current with immunizations as told by your health care provider. °Where to find more information: °· American Association of Kidney Patients: www.aakp.org °· National Kidney Foundation: www.kidney.org °· American Kidney Fund: www.akfinc.org °· [employer] [facility]: www.lifeoptions.org and www.kidneyschool.org °Contact a health care provider if: °· Your symptoms get worse. °· You develop new symptoms. °Get help right away if: °· You have weakness in an arm or leg on one side of your body. °· You have difficulty speaking or you are slurring your speech. °· You have a sudden change in your vision. °· You have a sudden, severe headache. °· You have a sudden weight increase. °· You have difficulty breathing. °· Your symptoms suddenly get worse. °This information is not intended to replace advice given to you by your health care provider. Make sure you discuss any questions you have with your health care provider. °Document Released: 09/21/2003 Document Revised: 12/07/2015 Document Reviewed: 02/28/2012 °Elsevier Interactive Patient Education © 2017 [employer]. ° °

## 2016-10-09 DIAGNOSIS — N186 End stage renal disease: Secondary | ICD-10-CM | POA: Diagnosis not present

## 2016-10-09 DIAGNOSIS — D689 Coagulation defect, unspecified: Secondary | ICD-10-CM | POA: Diagnosis not present

## 2016-10-09 DIAGNOSIS — E209 Hypoparathyroidism, unspecified: Secondary | ICD-10-CM | POA: Diagnosis not present

## 2016-10-09 DIAGNOSIS — D631 Anemia in chronic kidney disease: Secondary | ICD-10-CM | POA: Diagnosis not present

## 2016-10-09 DIAGNOSIS — E119 Type 2 diabetes mellitus without complications: Secondary | ICD-10-CM | POA: Diagnosis not present

## 2016-10-09 DIAGNOSIS — D509 Iron deficiency anemia, unspecified: Secondary | ICD-10-CM | POA: Diagnosis not present

## 2016-10-11 ENCOUNTER — Other Ambulatory Visit (INDEPENDENT_AMBULATORY_CARE_PROVIDER_SITE_OTHER): Payer: Self-pay | Admitting: Vascular Surgery

## 2016-10-11 DIAGNOSIS — E209 Hypoparathyroidism, unspecified: Secondary | ICD-10-CM | POA: Diagnosis not present

## 2016-10-11 DIAGNOSIS — E119 Type 2 diabetes mellitus without complications: Secondary | ICD-10-CM | POA: Diagnosis not present

## 2016-10-11 DIAGNOSIS — D689 Coagulation defect, unspecified: Secondary | ICD-10-CM | POA: Diagnosis not present

## 2016-10-11 DIAGNOSIS — D509 Iron deficiency anemia, unspecified: Secondary | ICD-10-CM | POA: Diagnosis not present

## 2016-10-11 DIAGNOSIS — N186 End stage renal disease: Secondary | ICD-10-CM | POA: Diagnosis not present

## 2016-10-11 DIAGNOSIS — D631 Anemia in chronic kidney disease: Secondary | ICD-10-CM | POA: Diagnosis not present

## 2016-10-12 DIAGNOSIS — N186 End stage renal disease: Secondary | ICD-10-CM | POA: Diagnosis not present

## 2016-10-12 DIAGNOSIS — Z992 Dependence on renal dialysis: Secondary | ICD-10-CM | POA: Diagnosis not present

## 2016-10-12 DIAGNOSIS — E1129 Type 2 diabetes mellitus with other diabetic kidney complication: Secondary | ICD-10-CM | POA: Diagnosis not present

## 2016-10-14 DIAGNOSIS — E209 Hypoparathyroidism, unspecified: Secondary | ICD-10-CM | POA: Diagnosis not present

## 2016-10-14 DIAGNOSIS — N2581 Secondary hyperparathyroidism of renal origin: Secondary | ICD-10-CM | POA: Diagnosis not present

## 2016-10-14 DIAGNOSIS — E119 Type 2 diabetes mellitus without complications: Secondary | ICD-10-CM | POA: Diagnosis not present

## 2016-10-14 DIAGNOSIS — N186 End stage renal disease: Secondary | ICD-10-CM | POA: Diagnosis not present

## 2016-10-14 DIAGNOSIS — D689 Coagulation defect, unspecified: Secondary | ICD-10-CM | POA: Diagnosis not present

## 2016-10-14 DIAGNOSIS — D631 Anemia in chronic kidney disease: Secondary | ICD-10-CM | POA: Diagnosis not present

## 2016-10-14 DIAGNOSIS — D509 Iron deficiency anemia, unspecified: Secondary | ICD-10-CM | POA: Diagnosis not present

## 2016-10-16 DIAGNOSIS — D631 Anemia in chronic kidney disease: Secondary | ICD-10-CM | POA: Diagnosis not present

## 2016-10-16 DIAGNOSIS — N186 End stage renal disease: Secondary | ICD-10-CM | POA: Diagnosis not present

## 2016-10-16 DIAGNOSIS — N2581 Secondary hyperparathyroidism of renal origin: Secondary | ICD-10-CM | POA: Diagnosis not present

## 2016-10-16 DIAGNOSIS — E119 Type 2 diabetes mellitus without complications: Secondary | ICD-10-CM | POA: Diagnosis not present

## 2016-10-16 DIAGNOSIS — D689 Coagulation defect, unspecified: Secondary | ICD-10-CM | POA: Diagnosis not present

## 2016-10-16 DIAGNOSIS — E209 Hypoparathyroidism, unspecified: Secondary | ICD-10-CM | POA: Diagnosis not present

## 2016-10-18 DIAGNOSIS — D631 Anemia in chronic kidney disease: Secondary | ICD-10-CM | POA: Diagnosis not present

## 2016-10-18 DIAGNOSIS — E209 Hypoparathyroidism, unspecified: Secondary | ICD-10-CM | POA: Diagnosis not present

## 2016-10-18 DIAGNOSIS — N186 End stage renal disease: Secondary | ICD-10-CM | POA: Diagnosis not present

## 2016-10-18 DIAGNOSIS — D689 Coagulation defect, unspecified: Secondary | ICD-10-CM | POA: Diagnosis not present

## 2016-10-18 DIAGNOSIS — E119 Type 2 diabetes mellitus without complications: Secondary | ICD-10-CM | POA: Diagnosis not present

## 2016-10-18 DIAGNOSIS — N2581 Secondary hyperparathyroidism of renal origin: Secondary | ICD-10-CM | POA: Diagnosis not present

## 2016-10-21 DIAGNOSIS — D689 Coagulation defect, unspecified: Secondary | ICD-10-CM | POA: Diagnosis not present

## 2016-10-21 DIAGNOSIS — N186 End stage renal disease: Secondary | ICD-10-CM | POA: Diagnosis not present

## 2016-10-21 DIAGNOSIS — D631 Anemia in chronic kidney disease: Secondary | ICD-10-CM | POA: Diagnosis not present

## 2016-10-21 DIAGNOSIS — E119 Type 2 diabetes mellitus without complications: Secondary | ICD-10-CM | POA: Diagnosis not present

## 2016-10-21 DIAGNOSIS — E209 Hypoparathyroidism, unspecified: Secondary | ICD-10-CM | POA: Diagnosis not present

## 2016-10-21 DIAGNOSIS — N2581 Secondary hyperparathyroidism of renal origin: Secondary | ICD-10-CM | POA: Diagnosis not present

## 2016-10-23 DIAGNOSIS — D689 Coagulation defect, unspecified: Secondary | ICD-10-CM | POA: Diagnosis not present

## 2016-10-23 DIAGNOSIS — E209 Hypoparathyroidism, unspecified: Secondary | ICD-10-CM | POA: Diagnosis not present

## 2016-10-23 DIAGNOSIS — N186 End stage renal disease: Secondary | ICD-10-CM | POA: Diagnosis not present

## 2016-10-23 DIAGNOSIS — N2581 Secondary hyperparathyroidism of renal origin: Secondary | ICD-10-CM | POA: Diagnosis not present

## 2016-10-23 DIAGNOSIS — D631 Anemia in chronic kidney disease: Secondary | ICD-10-CM | POA: Diagnosis not present

## 2016-10-23 DIAGNOSIS — E119 Type 2 diabetes mellitus without complications: Secondary | ICD-10-CM | POA: Diagnosis not present

## 2016-10-23 MED ORDER — CEFAZOLIN SODIUM-DEXTROSE 2-4 GM/100ML-% IV SOLN
2.0000 g | Freq: Once | INTRAVENOUS | Status: DC
  Filled 2016-10-23: qty 100

## 2016-10-23 MED ORDER — FAMOTIDINE 20 MG PO TABS: 40.0000 mg | ORAL_TABLET | ORAL | Status: DC | PRN

## 2016-10-23 MED ORDER — METHYLPREDNISOLONE SODIUM SUCC 125 MG IJ SOLR: 125.0000 mg | INTRAMUSCULAR | Status: DC | PRN

## 2016-10-24 ENCOUNTER — Encounter (INDEPENDENT_AMBULATORY_CARE_PROVIDER_SITE_OTHER): Payer: Self-pay

## 2016-10-25 DIAGNOSIS — E119 Type 2 diabetes mellitus without complications: Secondary | ICD-10-CM | POA: Diagnosis not present

## 2016-10-25 DIAGNOSIS — N2581 Secondary hyperparathyroidism of renal origin: Secondary | ICD-10-CM | POA: Diagnosis not present

## 2016-10-25 DIAGNOSIS — D631 Anemia in chronic kidney disease: Secondary | ICD-10-CM | POA: Diagnosis not present

## 2016-10-25 DIAGNOSIS — E209 Hypoparathyroidism, unspecified: Secondary | ICD-10-CM | POA: Diagnosis not present

## 2016-10-25 DIAGNOSIS — D689 Coagulation defect, unspecified: Secondary | ICD-10-CM | POA: Diagnosis not present

## 2016-10-25 DIAGNOSIS — N186 End stage renal disease: Secondary | ICD-10-CM | POA: Diagnosis not present

## 2016-10-28 ENCOUNTER — Other Ambulatory Visit (INDEPENDENT_AMBULATORY_CARE_PROVIDER_SITE_OTHER): Payer: Self-pay | Admitting: Vascular Surgery

## 2016-10-28 DIAGNOSIS — E209 Hypoparathyroidism, unspecified: Secondary | ICD-10-CM | POA: Diagnosis not present

## 2016-10-28 DIAGNOSIS — D631 Anemia in chronic kidney disease: Secondary | ICD-10-CM | POA: Diagnosis not present

## 2016-10-28 DIAGNOSIS — N2581 Secondary hyperparathyroidism of renal origin: Secondary | ICD-10-CM | POA: Diagnosis not present

## 2016-10-28 DIAGNOSIS — E119 Type 2 diabetes mellitus without complications: Secondary | ICD-10-CM | POA: Diagnosis not present

## 2016-10-28 DIAGNOSIS — N186 End stage renal disease: Secondary | ICD-10-CM | POA: Diagnosis not present

## 2016-10-28 DIAGNOSIS — D689 Coagulation defect, unspecified: Secondary | ICD-10-CM | POA: Diagnosis not present

## 2016-10-30 DIAGNOSIS — E209 Hypoparathyroidism, unspecified: Secondary | ICD-10-CM | POA: Diagnosis not present

## 2016-10-30 DIAGNOSIS — E119 Type 2 diabetes mellitus without complications: Secondary | ICD-10-CM | POA: Diagnosis not present

## 2016-10-30 DIAGNOSIS — N2581 Secondary hyperparathyroidism of renal origin: Secondary | ICD-10-CM | POA: Diagnosis not present

## 2016-10-30 DIAGNOSIS — D689 Coagulation defect, unspecified: Secondary | ICD-10-CM | POA: Diagnosis not present

## 2016-10-30 DIAGNOSIS — N186 End stage renal disease: Secondary | ICD-10-CM | POA: Diagnosis not present

## 2016-10-30 DIAGNOSIS — D631 Anemia in chronic kidney disease: Secondary | ICD-10-CM | POA: Diagnosis not present

## 2016-10-31 ENCOUNTER — Encounter
Admission: RE | Disposition: A | Discharge status: Home / Self Care | Payer: Self-pay | Source: Ambulatory Visit | Attending: Vascular Surgery

## 2016-10-31 ENCOUNTER — Encounter (INDEPENDENT_AMBULATORY_CARE_PROVIDER_SITE_OTHER): Payer: Self-pay

## 2016-10-31 ENCOUNTER — Ambulatory Visit
Admission: RE | Admit: 2016-10-31 | Discharge: 2016-10-31 | Disposition: A | Discharge status: Home / Self Care | Payer: Medicare Other | Source: Ambulatory Visit | Attending: Vascular Surgery | Admitting: Vascular Surgery

## 2016-10-31 ENCOUNTER — Encounter: Payer: Self-pay | Admitting: *Deleted

## 2016-10-31 DIAGNOSIS — Z981 Arthrodesis status: Secondary | ICD-10-CM | POA: Insufficient documentation

## 2016-10-31 DIAGNOSIS — Z89512 Acquired absence of left leg below knee: Secondary | ICD-10-CM | POA: Insufficient documentation

## 2016-10-31 DIAGNOSIS — Z8249 Family history of ischemic heart disease and other diseases of the circulatory system: Secondary | ICD-10-CM | POA: Diagnosis not present

## 2016-10-31 DIAGNOSIS — D631 Anemia in chronic kidney disease: Secondary | ICD-10-CM | POA: Diagnosis not present

## 2016-10-31 DIAGNOSIS — N186 End stage renal disease: Secondary | ICD-10-CM | POA: Insufficient documentation

## 2016-10-31 DIAGNOSIS — Z841 Family history of disorders of kidney and ureter: Secondary | ICD-10-CM | POA: Insufficient documentation

## 2016-10-31 DIAGNOSIS — Z6835 Body mass index (BMI) 35.0-35.9, adult: Secondary | ICD-10-CM | POA: Diagnosis not present

## 2016-10-31 DIAGNOSIS — Z9889 Other specified postprocedural states: Secondary | ICD-10-CM | POA: Diagnosis not present

## 2016-10-31 DIAGNOSIS — I499 Cardiac arrhythmia, unspecified: Secondary | ICD-10-CM | POA: Insufficient documentation

## 2016-10-31 DIAGNOSIS — Z992 Dependence on renal dialysis: Secondary | ICD-10-CM | POA: Diagnosis not present

## 2016-10-31 DIAGNOSIS — E1122 Type 2 diabetes mellitus with diabetic chronic kidney disease: Secondary | ICD-10-CM | POA: Diagnosis not present

## 2016-10-31 DIAGNOSIS — E039 Hypothyroidism, unspecified: Secondary | ICD-10-CM | POA: Insufficient documentation

## 2016-10-31 DIAGNOSIS — I120 Hypertensive chronic kidney disease with stage 5 chronic kidney disease or end stage renal disease: Secondary | ICD-10-CM | POA: Diagnosis not present

## 2016-10-31 DIAGNOSIS — F17210 Nicotine dependence, cigarettes, uncomplicated: Secondary | ICD-10-CM | POA: Insufficient documentation

## 2016-10-31 HISTORY — PX: UPPER EXTREMITY VENOGRAPHY: CATH118272

## 2016-10-31 SURGERY — UPPER EXTREMITY VENOGRAPHY
Anesthesia: Moderate Sedation | Laterality: Left

## 2016-10-31 MED ORDER — IOPAMIDOL (ISOVUE-300) INJECTION 61%
INTRAVENOUS | Status: DC | PRN
  Administered 2016-10-31: 15 mL via INTRAVENOUS

## 2016-10-31 MED ORDER — ONDANSETRON HCL 4 MG/2ML IJ SOLN
4.0000 mg | Freq: Four times a day (QID) | INTRAMUSCULAR | Status: DC | PRN
Start: 2016-10-31 — End: 2016-10-31

## 2016-10-31 MED ORDER — ACETAMINOPHEN 325 MG PO TABS: 325.0000 mg | ORAL_TABLET | ORAL | Status: DC | PRN

## 2016-10-31 MED ORDER — FENTANYL CITRATE (PF) 100 MCG/2ML IJ SOLN
INTRAMUSCULAR | Status: AC
  Filled 2016-10-31: qty 2

## 2016-10-31 MED ORDER — METOPROLOL TARTRATE 5 MG/5ML IV SOLN: 2.0000 mg | INTRAVENOUS | Status: DC | PRN

## 2016-10-31 MED ORDER — HEPARIN (PORCINE) IN NACL 2-0.9 UNIT/ML-% IJ SOLN
INTRAMUSCULAR | Status: AC
  Filled 2016-10-31: qty 500

## 2016-10-31 MED ORDER — OXYCODONE-ACETAMINOPHEN 5-325 MG PO TABS: 1.0000 | ORAL_TABLET | ORAL | Status: DC | PRN

## 2016-10-31 MED ORDER — LIDOCAINE-EPINEPHRINE (PF) 2 %-1:200000 IJ SOLN
INTRAMUSCULAR | Status: AC
  Filled 2016-10-31: qty 20

## 2016-10-31 MED ORDER — HEPARIN SODIUM (PORCINE) 1000 UNIT/ML IJ SOLN
INTRAMUSCULAR | Status: AC
  Filled 2016-10-31: qty 1

## 2016-10-31 MED ORDER — ONDANSETRON HCL 4 MG/2ML IJ SOLN: 4.0000 mg | Freq: Four times a day (QID) | INTRAMUSCULAR | Status: DC | PRN

## 2016-10-31 MED ORDER — HYDRALAZINE HCL 20 MG/ML IJ SOLN: 5.0000 mg | INTRAMUSCULAR | Status: DC | PRN

## 2016-10-31 MED ORDER — MORPHINE SULFATE (PF) 4 MG/ML IV SOLN: 2.0000 mg | INTRAVENOUS | Status: DC | PRN

## 2016-10-31 MED ORDER — HYDROMORPHONE HCL 1 MG/ML IJ SOLN: 1.0000 mg | Freq: Once | INTRAMUSCULAR | Status: DC | PRN

## 2016-10-31 MED ORDER — MIDAZOLAM HCL 2 MG/2ML IJ SOLN
INTRAMUSCULAR | Status: AC
  Filled 2016-10-31: qty 2

## 2016-10-31 MED ORDER — ACETAMINOPHEN 325 MG RE SUPP: 325.0000 mg | RECTAL | Status: DC | PRN

## 2016-10-31 MED ORDER — LABETALOL HCL 5 MG/ML IV SOLN: 10.0000 mg | INTRAVENOUS | Status: DC | PRN

## 2016-10-31 MED ORDER — GUAIFENESIN-DM 100-10 MG/5ML PO SYRP: 15.0000 mL | ORAL_SOLUTION | ORAL | Status: DC | PRN

## 2016-10-31 MED ORDER — SODIUM CHLORIDE 0.9 % IV SOLN: INTRAVENOUS | Status: DC

## 2016-10-31 MED ORDER — ALUM & MAG HYDROXIDE-SIMETH 200-200-20 MG/5ML PO SUSP: 15.0000 mL | ORAL | Status: DC | PRN

## 2016-10-31 MED ORDER — PHENOL 1.4 % MT LIQD: 1.0000 | OROMUCOSAL | Status: DC | PRN

## 2016-10-31 MED ORDER — SODIUM CHLORIDE 0.9 % IV SOLN: 500.0000 mL | Freq: Once | INTRAVENOUS | Status: DC | PRN

## 2016-10-31 SURGICAL SUPPLY — 7 items
CANNULA 5F STIFF (CANNULA) ×2 IMPLANT
CATH TORCON 5FR 0.38 (CATHETERS) ×2 IMPLANT
DRAPE BRACHIAL (DRAPES) ×2 IMPLANT
PACK ANGIOGRAPHY (CUSTOM PROCEDURE TRAY) ×2 IMPLANT
SHEATH 5FRX5.5 PEDS (SHEATH) ×2 IMPLANT
TOWEL OR 17X26 4PK STRL BLUE (TOWEL DISPOSABLE) ×2 IMPLANT
WIRE MAGIC TOR.035 180C (WIRE) ×2 IMPLANT

## 2016-10-31 NOTE — H&P (Signed)
[facility] History & Physical Update  The patient was interviewed and re-examined.  The patient's previous History and Physical has been reviewed and is unchanged.  There is no change in the plan of care. We plan to proceed with the scheduled procedure.  Leotis Pain, MD  10/31/2016, 8:55 AM

## 2016-10-31 NOTE — Op Note (Signed)
[facility]   OPERATIVE NOTE  DATE: 10/31/2016  PRE-OPERATIVE DIAGNOSIS: 1. ESRD 2. Difficult dialysis access  POST-OPERATIVE DIAGNOSIS: same  PROCEDURE: 1.   Ultrasound guidance for vascular access to left brachial vein 2.   Catheter placement into left subclavian vein from left brachial vein 3.   Left upper extremity venogram and superior venacavogram  SURGEON: Leotis Pain, MD  ASSISTANT(S): None  ANESTHESIA: Local   ESTIMATED BLOOD LOSS: minimal  FINDING(S): 1.  Left brachial vein was patent up to the left subclavian vein. Central venous circulation was initially difficult to image, so we upsized to a 5 [place] sheath and placed a Kumpe catheter over a Magic torque wire to the left subclavian vein to image the central venous circulation. 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  SPECIMEN(S):  None  INDICATIONS:   Patient is a 51 y.o.male who presents with a difficult dialysis access situation. The patient has a history of multiple access procedures, but now needs a new dialysis access. Upper extremity venogram is being performed to evaluate the adequacy of the upper extremities for an AV fistula or AV graft. We are evaluating the left arm as we pretty much have exhausted access options on the right arm. Risks and benefits were discussed and informed consent was obtained.   DESCRIPTION: After obtaining full informed written consent, the patient was brought back to the vascular suite and placed in supine position with their arms extended bilaterally.  After obtaining adequate local anesthesia, the patient was prepped and draped in the standard fashion in the left upper extremity. I turned my attention to the left arm.  The left brachial vein was then identified with ultrasound and found to be patent. This was accessed under direct ultrasound guidance with a  micropuncture needle and a micropuncture wire and sheath were then placed.  Imaging was performed through the micropuncture sheath. This demonstrated that the left brachial vein was patent up to the left subclavian vein. Central venous circulation was initially difficult to image, so we upsized to a 5 [place] sheath and placed a Kumpe catheter over a Magic torque wire to the left subclavian vein to image the central venous circulation. 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 This did not appear as if would support an access in the left arm.  At this point, I elected to terminate the procedure. The left brachial vein sheath was removed and pressure were held at the access site in the upper extremity. The patient was awakened from anesthesia and taken to the recovery room in stable condition.  COMPLICATIONS: None  CONDITION: Stable

## 2016-11-01 ENCOUNTER — Encounter: Payer: Self-pay | Admitting: Vascular Surgery

## 2016-11-01 DIAGNOSIS — N186 End stage renal disease: Secondary | ICD-10-CM | POA: Diagnosis not present

## 2016-11-01 DIAGNOSIS — E119 Type 2 diabetes mellitus without complications: Secondary | ICD-10-CM | POA: Diagnosis not present

## 2016-11-01 DIAGNOSIS — D689 Coagulation defect, unspecified: Secondary | ICD-10-CM | POA: Diagnosis not present

## 2016-11-01 DIAGNOSIS — N2581 Secondary hyperparathyroidism of renal origin: Secondary | ICD-10-CM | POA: Diagnosis not present

## 2016-11-01 DIAGNOSIS — E209 Hypoparathyroidism, unspecified: Secondary | ICD-10-CM | POA: Diagnosis not present

## 2016-11-01 DIAGNOSIS — D631 Anemia in chronic kidney disease: Secondary | ICD-10-CM | POA: Diagnosis not present

## 2016-11-04 ENCOUNTER — Other Ambulatory Visit (INDEPENDENT_AMBULATORY_CARE_PROVIDER_SITE_OTHER): Payer: Self-pay | Admitting: Vascular Surgery

## 2016-11-04 DIAGNOSIS — E119 Type 2 diabetes mellitus without complications: Secondary | ICD-10-CM | POA: Diagnosis not present

## 2016-11-04 DIAGNOSIS — N186 End stage renal disease: Secondary | ICD-10-CM | POA: Diagnosis not present

## 2016-11-04 DIAGNOSIS — D631 Anemia in chronic kidney disease: Secondary | ICD-10-CM | POA: Diagnosis not present

## 2016-11-04 DIAGNOSIS — E209 Hypoparathyroidism, unspecified: Secondary | ICD-10-CM | POA: Diagnosis not present

## 2016-11-04 DIAGNOSIS — D689 Coagulation defect, unspecified: Secondary | ICD-10-CM | POA: Diagnosis not present

## 2016-11-04 DIAGNOSIS — N2581 Secondary hyperparathyroidism of renal origin: Secondary | ICD-10-CM | POA: Diagnosis not present

## 2016-11-05 ENCOUNTER — Encounter
Admission: RE | Admit: 2016-11-05 | Discharge: 2016-11-05 | Disposition: A | Discharge status: Home / Self Care | Payer: Medicare Other | Source: Ambulatory Visit | Attending: Vascular Surgery | Admitting: Vascular Surgery

## 2016-11-05 DIAGNOSIS — E1151 Type 2 diabetes mellitus with diabetic peripheral angiopathy without gangrene: Secondary | ICD-10-CM | POA: Diagnosis not present

## 2016-11-05 DIAGNOSIS — Z7982 Long term (current) use of aspirin: Secondary | ICD-10-CM | POA: Diagnosis not present

## 2016-11-05 DIAGNOSIS — E1122 Type 2 diabetes mellitus with diabetic chronic kidney disease: Secondary | ICD-10-CM | POA: Diagnosis not present

## 2016-11-05 DIAGNOSIS — Z79899 Other long term (current) drug therapy: Secondary | ICD-10-CM | POA: Diagnosis not present

## 2016-11-05 DIAGNOSIS — R079 Chest pain, unspecified: Secondary | ICD-10-CM | POA: Diagnosis not present

## 2016-11-05 DIAGNOSIS — Z992 Dependence on renal dialysis: Secondary | ICD-10-CM | POA: Diagnosis not present

## 2016-11-05 DIAGNOSIS — Z6834 Body mass index (BMI) 34.0-34.9, adult: Secondary | ICD-10-CM | POA: Diagnosis not present

## 2016-11-05 DIAGNOSIS — I120 Hypertensive chronic kidney disease with stage 5 chronic kidney disease or end stage renal disease: Secondary | ICD-10-CM | POA: Diagnosis not present

## 2016-11-05 DIAGNOSIS — E213 Hyperparathyroidism, unspecified: Secondary | ICD-10-CM | POA: Diagnosis not present

## 2016-11-05 DIAGNOSIS — N186 End stage renal disease: Secondary | ICD-10-CM | POA: Diagnosis not present

## 2016-11-05 DIAGNOSIS — Z794 Long term (current) use of insulin: Secondary | ICD-10-CM | POA: Diagnosis not present

## 2016-11-05 DIAGNOSIS — Z89512 Acquired absence of left leg below knee: Secondary | ICD-10-CM | POA: Diagnosis not present

## 2016-11-05 DIAGNOSIS — K219 Gastro-esophageal reflux disease without esophagitis: Secondary | ICD-10-CM | POA: Diagnosis not present

## 2016-11-05 HISTORY — DX: Acquired absence of left leg below knee: Z89.512

## 2016-11-05 LAB — SURGICAL PCR SCREEN
MRSA, PCR: NEGATIVE
STAPHYLOCOCCUS AUREUS: NEGATIVE

## 2016-11-05 LAB — BASIC METABOLIC PANEL
Anion gap: 13 (ref 5–15)
BUN: 50 mg/dL — AB (ref 6–20)
CHLORIDE: 97 mmol/L — AB (ref 101–111)
CO2: 26 mmol/L (ref 22–32)
CREATININE: 11.29 mg/dL — AB (ref 0.61–1.24)
Calcium: 8.3 mg/dL — ABNORMAL LOW (ref 8.9–10.3)
GFR calc Af Amer: 5 mL/min — ABNORMAL LOW (ref >60)
GFR calc non Af Amer: 4 mL/min — ABNORMAL LOW (ref >60)
Glucose, Bld: 139 mg/dL — ABNORMAL HIGH (ref 65–99)
Potassium: 4.1 mmol/L (ref 3.5–5.1)
Sodium: 136 mmol/L (ref 135–145)

## 2016-11-05 LAB — CBC WITH DIFFERENTIAL/PLATELET
Basophils Absolute: 0.1 10*3/uL (ref 0–0.1)
Basophils Relative: 1 %
EOS ABS: 0.3 10*3/uL (ref 0–0.7)
EOS PCT: 4 %
HCT: 33.7 % — ABNORMAL LOW (ref 40.0–52.0)
HEMOGLOBIN: 11.3 g/dL — AB (ref 13.0–18.0)
LYMPHS ABS: 1.1 10*3/uL (ref 1.0–3.6)
Lymphocytes Relative: 17 %
MCH: 30.6 pg (ref 26.0–34.0)
MCHC: 33.6 g/dL (ref 32.0–36.0)
MCV: 91 fL (ref 80.0–100.0)
MONOS PCT: 11 %
Monocytes Absolute: 0.8 10*3/uL (ref 0.2–1.0)
NEUTROS PCT: 67 %
Neutro Abs: 4.5 10*3/uL (ref 1.4–6.5)
Platelets: 229 10*3/uL (ref 150–440)
RBC: 3.7 MIL/uL — ABNORMAL LOW (ref 4.40–5.90)
RDW: 16 % — ABNORMAL HIGH (ref 11.5–14.5)
WBC: 6.8 10*3/uL (ref 3.8–10.6)

## 2016-11-05 LAB — PROTIME-INR
INR: 1.1
PROTHROMBIN TIME: 14.2 s (ref 11.4–15.2)

## 2016-11-05 LAB — TYPE AND SCREEN
ABO/RH(D): A POS
Antibody Screen: NEGATIVE

## 2016-11-05 LAB — APTT: aPTT: 40 seconds — ABNORMAL HIGH (ref 24–36)

## 2016-11-05 NOTE — Patient Instructions (Signed)
  Your procedure is scheduled on: November 07, 2016 (Thursday) Report to Same Day Surgery 2nd floor medical mall [facility] Entrance-take elevator on left to 2nd floor.  Check in with surgery information desk.) To find out your arrival time please call [phone] between 1PM - 3PM on November 06, 2016 (Wednesday)  Remember: Instructions that are not followed completely may result in serious medical risk, up to and including death, or upon the discretion of your surgeon and anesthesiologist your surgery may need to be rescheduled.    _x___ 1. Do not eat food or drink liquids after midnight. No gum chewing or  hard candies                              __x__ 2. No Alcohol for 24 hours before or after surgery.   __x__3. No Smoking for 24 prior to surgery.   ____  4. Bring all medications with you on the day of surgery if instructed.    __x__ 5. Notify your doctor if there is any change in your medical condition     (cold, fever, infections).     Do not wear jewelry, make-up, hairpins, clips or nail polish.  Do not wear lotions, powders, or perfumes.   Do not shave 48 hours prior to surgery. Men may shave face and neck.  Do not bring valuables to the hospital.    [facility]: [facility] is not responsible for any belongings or valuables.               Contacts, dentures or bridgework may not be worn into surgery.  Leave your suitcase in the car. After surgery it may be brought to your room.  For patients admitted to the hospital, discharge time is determined by your   treatment team                  Patients discharged the day of surgery will not be allowed to drive home.  You will need someone to drive you home and stay with you the night of your procedure.    Please read over the following fact sheets that you were given:   [facility] Preparing for Surgery and or MRSA Information   ___ Take anti-hypertensive (unless it includes a diuretic), cardiac, seizure, asthma,     anti-reflux and psychiatric  medicines. These include:  1.   2.  3.  4.  5.  6.  ____Fleets enema or Magnesium Citrate as directed.   _x___ Use CHG Soap or sage wipes as directed on instruction sheet   ____ Use inhalers on the day of surgery and bring to hospital day of surgery  ____ Stop Metformin and Janumet 2 days prior to surgery.    _x___ Take 1/2 of usual insulin dose the night before surgery and none on the morning  surgery  (TAKE ONE-HALF OF LEVEMIR INSULIN ON WEDNESDAY NIGHT AND NO INSULIN THE MORNING OF SUGERY)  _x___ Follow recommendations from Cardiologist, Pulmonologist or PCP regarding          stopping Aspirin, Coumadin, Pllavix ,Eliquis, Effient, or Pradaxa, and Pletal.  X____Stop Anti-inflammatories such as Advil, Aleve, Ibuprofen, Motrin, Naproxen, Naprosyn, Goodies powders or aspirin products. OK to take Tylenol    _x___ Stop supplements until after surgery.  But may continue Vitamin D, Vitamin B,       and multivitamin.   ____ Bring C-Pap to the hospital.

## 2016-11-05 NOTE — Pre-Procedure Instructions (Signed)
Labs faxed to Dr. Lucky Cowboy office for review.

## 2016-11-06 DIAGNOSIS — N2581 Secondary hyperparathyroidism of renal origin: Secondary | ICD-10-CM | POA: Diagnosis not present

## 2016-11-06 DIAGNOSIS — D631 Anemia in chronic kidney disease: Secondary | ICD-10-CM | POA: Diagnosis not present

## 2016-11-06 DIAGNOSIS — D689 Coagulation defect, unspecified: Secondary | ICD-10-CM | POA: Diagnosis not present

## 2016-11-06 DIAGNOSIS — N186 End stage renal disease: Secondary | ICD-10-CM | POA: Diagnosis not present

## 2016-11-06 DIAGNOSIS — E1129 Type 2 diabetes mellitus with other diabetic kidney complication: Secondary | ICD-10-CM | POA: Diagnosis not present

## 2016-11-06 DIAGNOSIS — E209 Hypoparathyroidism, unspecified: Secondary | ICD-10-CM | POA: Diagnosis not present

## 2016-11-06 DIAGNOSIS — E119 Type 2 diabetes mellitus without complications: Secondary | ICD-10-CM | POA: Diagnosis not present

## 2016-11-06 MED ORDER — CEFAZOLIN SODIUM-DEXTROSE 2-4 GM/100ML-% IV SOLN
2.0000 g | INTRAVENOUS | Status: AC
  Administered 2016-11-07: 2 g via INTRAVENOUS

## 2016-11-06 MED ORDER — FAMOTIDINE 20 MG PO TABS
20.0000 mg | ORAL_TABLET | Freq: Once | ORAL | Status: AC
  Administered 2016-11-07: 20 mg via ORAL

## 2016-11-07 ENCOUNTER — Ambulatory Visit
Admission: RE | Admit: 2016-11-07 | Discharge: 2016-11-07 | Disposition: A | Discharge status: Home / Self Care | Payer: Medicare Other | Source: Ambulatory Visit | Attending: Vascular Surgery | Admitting: Vascular Surgery

## 2016-11-07 ENCOUNTER — Encounter
Admission: RE | Disposition: A | Discharge status: Home / Self Care | Payer: Self-pay | Source: Ambulatory Visit | Attending: Vascular Surgery

## 2016-11-07 ENCOUNTER — Encounter: Payer: Self-pay | Admitting: Anesthesiology

## 2016-11-07 ENCOUNTER — Ambulatory Visit: Payer: Medicare Other | Admitting: Anesthesiology

## 2016-11-07 DIAGNOSIS — D649 Anemia, unspecified: Secondary | ICD-10-CM | POA: Diagnosis not present

## 2016-11-07 DIAGNOSIS — N186 End stage renal disease: Secondary | ICD-10-CM | POA: Insufficient documentation

## 2016-11-07 DIAGNOSIS — Z89512 Acquired absence of left leg below knee: Secondary | ICD-10-CM | POA: Insufficient documentation

## 2016-11-07 DIAGNOSIS — Z79899 Other long term (current) drug therapy: Secondary | ICD-10-CM | POA: Insufficient documentation

## 2016-11-07 DIAGNOSIS — I120 Hypertensive chronic kidney disease with stage 5 chronic kidney disease or end stage renal disease: Secondary | ICD-10-CM | POA: Insufficient documentation

## 2016-11-07 DIAGNOSIS — E1122 Type 2 diabetes mellitus with diabetic chronic kidney disease: Secondary | ICD-10-CM | POA: Insufficient documentation

## 2016-11-07 DIAGNOSIS — Z992 Dependence on renal dialysis: Secondary | ICD-10-CM | POA: Insufficient documentation

## 2016-11-07 DIAGNOSIS — I7389 Other specified peripheral vascular diseases: Secondary | ICD-10-CM | POA: Diagnosis not present

## 2016-11-07 DIAGNOSIS — Z794 Long term (current) use of insulin: Secondary | ICD-10-CM | POA: Insufficient documentation

## 2016-11-07 DIAGNOSIS — Z7982 Long term (current) use of aspirin: Secondary | ICD-10-CM | POA: Insufficient documentation

## 2016-11-07 DIAGNOSIS — E1151 Type 2 diabetes mellitus with diabetic peripheral angiopathy without gangrene: Secondary | ICD-10-CM | POA: Insufficient documentation

## 2016-11-07 DIAGNOSIS — I743 Embolism and thrombosis of arteries of the lower extremities: Secondary | ICD-10-CM | POA: Diagnosis not present

## 2016-11-07 DIAGNOSIS — Z6834 Body mass index (BMI) 34.0-34.9, adult: Secondary | ICD-10-CM | POA: Insufficient documentation

## 2016-11-07 DIAGNOSIS — E119 Type 2 diabetes mellitus without complications: Secondary | ICD-10-CM | POA: Diagnosis not present

## 2016-11-07 DIAGNOSIS — K219 Gastro-esophageal reflux disease without esophagitis: Secondary | ICD-10-CM | POA: Diagnosis not present

## 2016-11-07 DIAGNOSIS — E213 Hyperparathyroidism, unspecified: Secondary | ICD-10-CM | POA: Insufficient documentation

## 2016-11-07 HISTORY — PX: ENDARTERECTOMY FEMORAL: SHX5804

## 2016-11-07 HISTORY — PX: AV FISTULA PLACEMENT: SHX1204

## 2016-11-07 LAB — POCT I-STAT 4, (NA,K, GLUC, HGB,HCT)
Glucose, Bld: 165 mg/dL — ABNORMAL HIGH (ref 65–99)
HCT: 33 % — ABNORMAL LOW (ref 39.0–52.0)
HEMOGLOBIN: 11.2 g/dL — AB (ref 13.0–17.0)
POTASSIUM: 4.1 mmol/L (ref 3.5–5.1)
Sodium: 137 mmol/L (ref 135–145)

## 2016-11-07 LAB — GLUCOSE, CAPILLARY
GLUCOSE-CAPILLARY: 133 mg/dL — AB (ref 65–99)
Glucose-Capillary: 160 mg/dL — ABNORMAL HIGH (ref 65–99)

## 2016-11-07 SURGERY — INSERTION OF ARTERIOVENOUS (AV) GORE-TEX GRAFT THIGH
Anesthesia: General | Laterality: Left | Wound class: Clean

## 2016-11-07 MED ORDER — PROPOFOL 10 MG/ML IV BOLUS
INTRAVENOUS | Status: AC
  Filled 2016-11-07: qty 20

## 2016-11-07 MED ORDER — SODIUM CHLORIDE 0.9% FLUSH
10.0000 mL | Freq: Once | INTRAVENOUS | Status: AC
  Administered 2016-11-07: 10 mL via INTRAVENOUS

## 2016-11-07 MED ORDER — FENTANYL CITRATE (PF) 100 MCG/2ML IJ SOLN: 25.0000 ug | INTRAMUSCULAR | Status: DC | PRN

## 2016-11-07 MED ORDER — FENTANYL CITRATE (PF) 100 MCG/2ML IJ SOLN
INTRAMUSCULAR | Status: AC
  Filled 2016-11-07: qty 2

## 2016-11-07 MED ORDER — SODIUM CHLORIDE FLUSH 0.9 % IV SOLN
INTRAVENOUS | Status: AC
  Administered 2016-11-07: 10 mL via INTRAVENOUS
  Filled 2016-11-07: qty 10

## 2016-11-07 MED ORDER — HEPARIN SODIUM (PORCINE) 1000 UNIT/ML IJ SOLN
INTRAMUSCULAR | Status: AC
  Filled 2016-11-07: qty 1

## 2016-11-07 MED ORDER — PHENYLEPHRINE HCL 10 MG/ML IJ SOLN
INTRAMUSCULAR | Status: AC
  Administered 2016-11-07: 0.1 mg via INTRAVENOUS
  Filled 2016-11-07: qty 1

## 2016-11-07 MED ORDER — PHENYLEPHRINE HCL 10 MG/ML IJ SOLN
0.1000 mg | Freq: Once | INTRAMUSCULAR | Status: AC
  Administered 2016-11-07: 0.1 mg via INTRAVENOUS

## 2016-11-07 MED ORDER — SODIUM CHLORIDE 0.9 % IV SOLN
INTRAVENOUS | Status: DC | PRN
  Administered 2016-11-07: 40 ug/min via INTRAVENOUS

## 2016-11-07 MED ORDER — OXYCODONE-ACETAMINOPHEN 5-325 MG PO TABS
ORAL_TABLET | ORAL | Status: AC
  Administered 2016-11-07: 1 via ORAL
  Filled 2016-11-07: qty 1

## 2016-11-07 MED ORDER — FENTANYL CITRATE (PF) 100 MCG/2ML IJ SOLN
INTRAMUSCULAR | Status: DC | PRN
  Administered 2016-11-07 (×5): 50 ug via INTRAVENOUS

## 2016-11-07 MED ORDER — BUPIVACAINE-EPINEPHRINE (PF) 0.5% -1:200000 IJ SOLN
INTRAMUSCULAR | Status: AC
  Filled 2016-11-07: qty 30

## 2016-11-07 MED ORDER — FAMOTIDINE 20 MG PO TABS
ORAL_TABLET | ORAL | Status: AC
  Administered 2016-11-07: 20 mg via ORAL
  Filled 2016-11-07: qty 1

## 2016-11-07 MED ORDER — OXYCODONE-ACETAMINOPHEN 5-325 MG PO TABS
ORAL_TABLET | ORAL | Status: AC
  Filled 2016-11-07: qty 1

## 2016-11-07 MED ORDER — ROCURONIUM BROMIDE 50 MG/5ML IV SOLN
INTRAVENOUS | Status: AC
  Filled 2016-11-07: qty 1

## 2016-11-07 MED ORDER — EVICEL 2 ML EX KIT
PACK | CUTANEOUS | Status: AC
  Filled 2016-11-07: qty 1

## 2016-11-07 MED ORDER — GLYCOPYRROLATE 0.2 MG/ML IJ SOLN
INTRAMUSCULAR | Status: DC | PRN
  Administered 2016-11-07: 0.2 mg via INTRAVENOUS

## 2016-11-07 MED ORDER — ONDANSETRON HCL 4 MG/2ML IJ SOLN
INTRAMUSCULAR | Status: AC
  Filled 2016-11-07: qty 2

## 2016-11-07 MED ORDER — OXYCODONE-ACETAMINOPHEN 5-325 MG PO TABS
1.0000 | ORAL_TABLET | ORAL | Status: DC | PRN
  Administered 2016-11-07: 1 via ORAL

## 2016-11-07 MED ORDER — SUCCINYLCHOLINE CHLORIDE 20 MG/ML IJ SOLN
INTRAMUSCULAR | Status: AC
  Filled 2016-11-07: qty 1

## 2016-11-07 MED ORDER — SODIUM CHLORIDE 0.9 % IV SOLN
INTRAVENOUS | Status: DC | PRN
  Administered 2016-11-07: 15:00:00 500 mL via INTRAMUSCULAR

## 2016-11-07 MED ORDER — SODIUM CHLORIDE 0.9 % IV BOLUS (SEPSIS): 300.0000 mL | Freq: Once | INTRAVENOUS | Status: DC

## 2016-11-07 MED ORDER — SODIUM CHLORIDE FLUSH 0.9 % IV SOLN
INTRAVENOUS | Status: AC
  Filled 2016-11-07: qty 10

## 2016-11-07 MED ORDER — SODIUM CHLORIDE 0.9 % IJ SOLN
INTRAMUSCULAR | Status: AC
  Filled 2016-11-07: qty 20

## 2016-11-07 MED ORDER — HEPARIN SODIUM (PORCINE) 1000 UNIT/ML IJ SOLN
1000.0000 [IU] | Freq: Once | INTRAMUSCULAR | Status: AC
  Administered 2016-11-07: 1000 [IU] via INTRAVENOUS

## 2016-11-07 MED ORDER — PHENYLEPHRINE HCL 10 MG/ML IJ SOLN
INTRAMUSCULAR | Status: AC
  Filled 2016-11-07: qty 1

## 2016-11-07 MED ORDER — ONDANSETRON HCL 4 MG/2ML IJ SOLN
INTRAMUSCULAR | Status: DC | PRN
  Administered 2016-11-07: 4 mg via INTRAVENOUS

## 2016-11-07 MED ORDER — HEPARIN SODIUM (PORCINE) 1000 UNIT/ML IJ SOLN
INTRAMUSCULAR | Status: DC | PRN
  Administered 2016-11-07: 4000 [IU] via INTRAVENOUS

## 2016-11-07 MED ORDER — HEPARIN SODIUM (PORCINE) 5000 UNIT/ML IJ SOLN
INTRAMUSCULAR | Status: AC
  Filled 2016-11-07: qty 1

## 2016-11-07 MED ORDER — GLYCOPYRROLATE 0.2 MG/ML IJ SOLN
INTRAMUSCULAR | Status: AC
  Filled 2016-11-07: qty 1

## 2016-11-07 MED ORDER — SODIUM CHLORIDE 0.9 % IV SOLN
INTRAVENOUS | Status: DC
  Administered 2016-11-07 (×2): via INTRAVENOUS

## 2016-11-07 MED ORDER — OXYCODONE-ACETAMINOPHEN 5-325 MG PO TABS: 1.0000 | ORAL_TABLET | ORAL | 0 refills | Status: DC | PRN

## 2016-11-07 MED ORDER — SODIUM CHLORIDE 0.9 % IJ SOLN
INTRAMUSCULAR | Status: AC
  Filled 2016-11-07: qty 150

## 2016-11-07 MED ORDER — PROPOFOL 10 MG/ML IV BOLUS
INTRAVENOUS | Status: DC | PRN
  Administered 2016-11-07: 200 mg via INTRAVENOUS
  Administered 2016-11-07 (×2): 50 mg via INTRAVENOUS

## 2016-11-07 MED ORDER — ONDANSETRON HCL 4 MG/2ML IJ SOLN: 4.0000 mg | Freq: Once | INTRAMUSCULAR | Status: DC | PRN

## 2016-11-07 SURGICAL SUPPLY — 60 items
ADH SKN CLS APL DERMABOND .7 (GAUZE/BANDAGES/DRESSINGS) ×2
BAG DECANTER FOR FLEXI CONT (MISCELLANEOUS) ×4 IMPLANT
BLADE SURG SZ11 CARB STEEL (BLADE) ×4 IMPLANT
BOOT SUTURE AID YELLOW STND (SUTURE) ×4 IMPLANT
BRUSH SCRUB 4% CHG (MISCELLANEOUS) ×4 IMPLANT
CANISTER SUCT 1200ML W/VALVE (MISCELLANEOUS) ×4 IMPLANT
CHLORAPREP W/TINT 26ML (MISCELLANEOUS) ×4 IMPLANT
CLIP SPRNG 6 S-JAW DBL (CLIP) ×2 IMPLANT
CLIP SPRNG 6MM S-JAW DBL (CLIP) ×4
DECANTER SPIKE VIAL GLASS SM (MISCELLANEOUS) ×4 IMPLANT
DERMABOND ADVANCED (GAUZE/BANDAGES/DRESSINGS) ×2
DERMABOND ADVANCED .7 DNX12 (GAUZE/BANDAGES/DRESSINGS) ×2 IMPLANT
ELECT CAUTERY BLADE 6.4 (BLADE) ×4 IMPLANT
ELECT REM PT RETURN 9FT ADLT (ELECTROSURGICAL) ×4
ELECTRODE REM PT RTRN 9FT ADLT (ELECTROSURGICAL) ×2 IMPLANT
GEL ULTRASOUND 20GR AQUASONIC (MISCELLANEOUS) IMPLANT
GLOVE BIO SURGEON STRL SZ7 (GLOVE) ×8 IMPLANT
GLOVE INDICATOR 7.5 STRL GRN (GLOVE) ×4 IMPLANT
GOWN STRL REUS W/ TWL LRG LVL3 (GOWN DISPOSABLE) ×4 IMPLANT
GOWN STRL REUS W/ TWL XL LVL3 (GOWN DISPOSABLE) ×2 IMPLANT
GOWN STRL REUS W/TWL LRG LVL3 (GOWN DISPOSABLE) ×8
GOWN STRL REUS W/TWL XL LVL3 (GOWN DISPOSABLE) ×4
GRAFT COLLAGEN VASCULAR 8X45 (Miscellaneous) ×2 IMPLANT
HEMOSTAT SURGICEL 2X3 (HEMOSTASIS) ×6 IMPLANT
IV NS 500ML (IV SOLUTION) ×4
IV NS 500ML BAXH (IV SOLUTION) ×2 IMPLANT
KIT RM TURNOVER STRD PROC AR (KITS) ×4 IMPLANT
LABEL OR SOLS (LABEL) ×4 IMPLANT
LOOP RED MAXI  1X406MM (MISCELLANEOUS) ×2
LOOP VESSEL MAXI 1X406 RED (MISCELLANEOUS) ×2 IMPLANT
LOOP VESSEL MINI 0.8X406 BLUE (MISCELLANEOUS) ×2 IMPLANT
LOOPS BLUE MINI 0.8X406MM (MISCELLANEOUS) ×2
NDL FILTER BLUNT 18X1 1/2 (NEEDLE) ×2 IMPLANT
NDL HYPO 30X.5 LL (NEEDLE) IMPLANT
NEEDLE FILTER BLUNT 18X 1/2SAF (NEEDLE) ×2
NEEDLE FILTER BLUNT 18X1 1/2 (NEEDLE) ×2 IMPLANT
NEEDLE HYPO 30X.5 LL (NEEDLE) IMPLANT
NS IRRIG 500ML POUR BTL (IV SOLUTION) ×4 IMPLANT
PACK EXTREMITY ARMC (MISCELLANEOUS) ×4 IMPLANT
PAD PREP 24X41 OB/GYN DISP (PERSONAL CARE ITEMS) ×4 IMPLANT
SOLUTION CELL SAVER (CLIP) ×2 IMPLANT
STOCKINETTE 48X4 2 PLY STRL (GAUZE/BANDAGES/DRESSINGS) ×2 IMPLANT
STOCKINETTE STRL 4IN 9604848 (GAUZE/BANDAGES/DRESSINGS) ×4 IMPLANT
SUT GORETEX CV-6TTC-13 36IN (SUTURE) ×8 IMPLANT
SUT MNCRL AB 4-0 PS2 18 (SUTURE) ×4 IMPLANT
SUT PROLENE 6 0 BV (SUTURE) ×16 IMPLANT
SUT SILK 0 SH 30 (SUTURE) ×6 IMPLANT
SUT SILK 2 0 (SUTURE) ×8
SUT SILK 2-0 18XBRD TIE 12 (SUTURE) ×2 IMPLANT
SUT SILK 3 0 (SUTURE) ×4
SUT SILK 3-0 18XBRD TIE 12 (SUTURE) ×2 IMPLANT
SUT SILK 4 0 (SUTURE) ×4
SUT SILK 4-0 18XBRD TIE 12 (SUTURE) ×2 IMPLANT
SUT VIC AB 3-0 SH 27 (SUTURE) ×8
SUT VIC AB 3-0 SH 27X BRD (SUTURE) ×2 IMPLANT
SYR 20CC LL (SYRINGE) ×4 IMPLANT
SYR 3ML LL SCALE MARK (SYRINGE) ×4 IMPLANT
SYR TB 1ML 27GX1/2 LL (SYRINGE) IMPLANT
SYRINGE IRR TOOMEY STRL 70CC (SYRINGE) ×2 IMPLANT
TOWEL OR 17X26 4PK STRL BLUE (TOWEL DISPOSABLE) ×4 IMPLANT

## 2016-11-07 NOTE — H&P (Signed)
[facility] History & Physical Update  The patient was interviewed and re-examined.  The patient's previous History and Physical has been reviewed and is unchanged.  There is no change in the plan of care. We plan to proceed with the scheduled procedure.  Leotis Pain, MD  11/07/2016, 11:28 AM

## 2016-11-07 NOTE — Discharge Instructions (Signed)

## 2016-11-07 NOTE — Anesthesia Procedure Notes (Signed)
Procedure Name: LMA Insertion Performed by: Emera Bussie Pre-anesthesia Checklist: Patient identified, Patient being monitored, Timeout performed, Emergency Drugs available and Suction available Patient Re-evaluated:Patient Re-evaluated prior to inductionOxygen Delivery Method: Circle system utilized Preoxygenation: Pre-oxygenation with 100% oxygen Intubation Type: IV induction LMA: LMA inserted LMA Size: 4.5 Tube type: Oral Number of attempts: 1 Placement Confirmation: positive ETCO2 and breath sounds checked- equal and bilateral Tube secured with: Tape Dental Injury: Teeth and Oropharynx as per pre-operative assessment        

## 2016-11-07 NOTE — Progress Notes (Signed)
Called dr dew pre dr Ronelle Nigh request about low blood pressures after bolus and neo bolus x2

## 2016-11-07 NOTE — Progress Notes (Signed)
Neo given twice now for low blood pressures  Dr Ronelle Nigh in to see pt

## 2016-11-07 NOTE — Anesthesia Preprocedure Evaluation (Addendum)
Anesthesia Evaluation  Patient identified by MRN, date of birth, ID band Patient awake    Reviewed: Allergy & Precautions, NPO status , Patient's Chart, lab work & pertinent test results, reviewed documented beta blocker date and time   Airway Mallampati: III  TM Distance: >3 FB     Dental  (+) Chipped   Pulmonary former smoker,           Cardiovascular + Peripheral Vascular Disease and +CHF  + dysrhythmias      Neuro/Psych    GI/Hepatic GERD  Controlled,  Endo/Other  diabetes, Type 2  Renal/GU ESRFRenal disease     Musculoskeletal   Abdominal   Peds  Hematology  (+) anemia ,   Anesthesia Other Findings   Reproductive/Obstetrics                            Anesthesia Physical Anesthesia Plan  ASA: III  Anesthesia Plan: General   Post-op Pain Management:    Induction: Intravenous  Airway Management Planned: LMA  Additional Equipment:   Intra-op Plan:   Post-operative Plan:   Informed Consent: I have reviewed the patients History and Physical, chart, labs and discussed the procedure including the risks, benefits and alternatives for the proposed anesthesia with the patient or authorized representative who has indicated his/her understanding and acceptance.     Plan Discussed with: CRNA  Anesthesia Plan Comments:         Anesthesia Quick Evaluation

## 2016-11-07 NOTE — Op Note (Signed)
OPERATIVE NOTE   PROCEDURE: 1. Left superficial femoral artery to femoral vein arteriovenous graft with Artegraft 2. Left superficial femoral artery endarterectomy   PRE-OPERATIVE DIAGNOSIS: 1. ESRD, longstanding with multiple failed previous accesses   POST-OPERATIVE DIAGNOSIS: same with significant calcific atherosclerosis of the femoral artery  SURGEON: Leotis Pain, MD  ASSISTANT(S): Hezzie Bump, PA-C  ANESTHESIA: General  ESTIMATED BLOOD LOSS: 25 cc  FINDING(S): 1. Calcific atherosclerosis of the femoral artery  SPECIMEN(S): None  INDICATIONS:  Patient is a 51 y.o.male who presents with ESRD and limited dialysis access options. We are now placing a thigh graft for long term access. Risk, benefits, and alternatives to thigh arteriovenous graft placement were discussed. The patient is aware the risks include but are not limited to: bleeding, infection, steal syndrome, nerve damage, ischemic monomelic neuropathy, failure to mature, possible development of critical limb ischemia due to steal syndrome and need for additional procedures. The patient is aware of the risks and elects to proceed forward.  DESCRIPTION: After full informed written consent was obtained from the patient, the patient was brought back to the operating room and placed supine upon the operating table. The patient was given IV antibiotics prior to proceeding. After obtaining adequate anesthesia, the patient was prepped and draped in standard fashion for a left thigh arteriovenous graft placement. I made a transverse/oblique incision over the left common femoral artery and dissected down through the subcutaneous tissue until I had access to the left common femoral artery bifurcation and common femoral vein. The common femoral artery and proximal SFA and appeared diseased with calcific plaque. The patient had known peripheral arterial disease and had already undergone a left below-knee amputation. I felt  with this situation it would be best to avoid using the common femoral artery and profunda femoris artery creating less likelihood of creating ischemia in the left leg. The SFA was diseased proximally, and in order to use this as our inflow I felt an endarterectomy was likely going to be required. I dissected out the artery over several centimeters over its proximal portion and placed Vesseloops at the origin of the SFA and about 4-5 cm downstream for distal control. The vein was dissected out to be controlled with a large partial occlusion clamp. This was somewhat tedious due to the cicatrix of scar from previous catheters she had had left femoral vein as well. Several venous branches were ligated and divided between silk ties. This was dissected out at the area of the saphenofemoral junction and onto the first couple centimeters of the common femoral vein. I then used the most curved tunneler with a small counter incision in the mid thigh. A 7 mm diameter Artegraft was selected. The graft was then tunneled and marked for orientation to avoid kinking.  The patient was then heparinized with 4000 units of heparin. Control was achieved on the artery with vessel loops and an anterior wall arteriotomy was created with an 11 blade and extended with Potts scissors on the proximal superficial femoral artery. There was a thick circumferential plaque and I used the Soil scientist to help generate a plane to remove the plaque particularly in the anterior wall of the artery. I flushed the vessel to ensure that there was good inflow and there was. All loose flecks were removed and the vessel was locally heparinized after the performance of the endarterectomy removing significant portion of anterior wall plaque. I then spatulate the graft to meet the dimensions of the artery and sewed to the artery in  an end-to-side configuration with running stitch of 6-0 Prolene suture. I clamped the graft near its arterial anastomosis and  released the clamps on the superficial femoral artery. I then pulled the graft to appropriate tension throughout the tunnel. I then clamped the common femoral vein proximally and distally using a partial occlusion clamp and made a venotomy in the common femoral vein, which I extended with a Pott's scissor across the saphenofemoral junction. The graft was then spatulated to meet the dimensions of the venotomy. The graft was sewn to the vein in an end-to-side configuration with a running stitch of 6-0 Prolene suture. Prior to completion this anastomosis, I released all clamps on the graft and vein, allowing the graft and vein to backbleed before completing the anastomosis. There was excellent graft bleeding. There was excellent backbleeding from the vein. The anastomosis was completed in the usual fashion. 6-0 Prolene patch sutures were used for hemostasis as needed. The wound was irrigated. I placed Surgicel and Evicel in the wound and there was no further active bleeding. The groin was repaired with a layer of 2-0 Vicryl, a double layer of 3-0 Vicryl, and a running subcuticular of 4-0 Monocryl.The counterincision was closed with a single 3-0 Vicryl and a single 4-0 Monocryl. The skin was then cleaned, dried, and Dermabond used to reinforce the skin closure. The patient was then awakened and taken to the recovery room in stable condition having tolerated the procedure well.   COMPLICATIONS: None  CONDITION: stable  Leotis Pain 11/07/2016 2:44 PM   This note was created with Dragon Medical transcription system. Any errors in dictation are purely unintentional.

## 2016-11-07 NOTE — Progress Notes (Signed)
Pt does not want another percocet at this time  Dr dew aware of blood pressure 100/ 59 pt  States his blood  Pressure runs low

## 2016-11-07 NOTE — Anesthesia Post-op Follow-up Note (Cosign Needed)
Anesthesia QCDR form completed.        

## 2016-11-07 NOTE — Transfer of Care (Signed)
Immediate Anesthesia Transfer of Care Note  Patient: [name]  Procedure(s) Performed: Procedure(s): INSERTION OF ARTERIOVENOUS (AV) Artegraft (Left) ENDARTERECTOMY FEMORAL  Patient Location: PACU  Anesthesia Type:General  Level of Consciousness: awake and alert   Airway & Oxygen Therapy: Patient Spontanous Breathing  Post-op Assessment: Report given to RN and Post -op Vital signs reviewed and stable  Post vital signs: Reviewed and stable  Last Vitals:  Vitals:   11/07/16 1051 11/07/16 1510  BP: 134/81 (!) 110/47  Pulse: 94 99  Resp: 16 10  Temp: 36.7 C 37.2 C    Last Pain:  Vitals:   11/07/16 1510  TempSrc: Temporal         Complications: No apparent anesthesia complications

## 2016-11-08 ENCOUNTER — Encounter: Payer: Self-pay | Admitting: Vascular Surgery

## 2016-11-08 DIAGNOSIS — N2581 Secondary hyperparathyroidism of renal origin: Secondary | ICD-10-CM | POA: Diagnosis not present

## 2016-11-08 DIAGNOSIS — D689 Coagulation defect, unspecified: Secondary | ICD-10-CM | POA: Diagnosis not present

## 2016-11-08 DIAGNOSIS — E119 Type 2 diabetes mellitus without complications: Secondary | ICD-10-CM | POA: Diagnosis not present

## 2016-11-08 DIAGNOSIS — E209 Hypoparathyroidism, unspecified: Secondary | ICD-10-CM | POA: Diagnosis not present

## 2016-11-08 DIAGNOSIS — N186 End stage renal disease: Secondary | ICD-10-CM | POA: Diagnosis not present

## 2016-11-08 DIAGNOSIS — D631 Anemia in chronic kidney disease: Secondary | ICD-10-CM | POA: Diagnosis not present

## 2016-11-08 NOTE — Anesthesia Postprocedure Evaluation (Signed)
Anesthesia Post Note  Patient: [name]  Procedure(s) Performed: Procedure(s) (LRB): INSERTION OF ARTERIOVENOUS (AV) Artegraft (Left) ENDARTERECTOMY FEMORAL  Patient location during evaluation: PACU Anesthesia Type: General Level of consciousness: awake and alert Pain management: pain level controlled Vital Signs Assessment: post-procedure vital signs reviewed and stable Respiratory status: spontaneous breathing, nonlabored ventilation, respiratory function stable and patient connected to nasal cannula oxygen Cardiovascular status: blood pressure returned to baseline and stable Postop Assessment: no signs of nausea or vomiting Anesthetic complications: no     Last Vitals:  Vitals:   11/07/16 1652 11/07/16 1707  BP: 114/74 111/63  Pulse: 98 97  Resp: 20 20  Temp: 36.1 C     Last Pain:  Vitals:   11/07/16 1626  TempSrc:   PainSc: 10-Worst pain ever                 Martha Clan

## 2016-11-11 DIAGNOSIS — D689 Coagulation defect, unspecified: Secondary | ICD-10-CM | POA: Diagnosis not present

## 2016-11-11 DIAGNOSIS — N2581 Secondary hyperparathyroidism of renal origin: Secondary | ICD-10-CM | POA: Diagnosis not present

## 2016-11-11 DIAGNOSIS — E209 Hypoparathyroidism, unspecified: Secondary | ICD-10-CM | POA: Diagnosis not present

## 2016-11-11 DIAGNOSIS — Z992 Dependence on renal dialysis: Secondary | ICD-10-CM | POA: Diagnosis not present

## 2016-11-11 DIAGNOSIS — E119 Type 2 diabetes mellitus without complications: Secondary | ICD-10-CM | POA: Diagnosis not present

## 2016-11-11 DIAGNOSIS — E1129 Type 2 diabetes mellitus with other diabetic kidney complication: Secondary | ICD-10-CM | POA: Diagnosis not present

## 2016-11-11 DIAGNOSIS — D631 Anemia in chronic kidney disease: Secondary | ICD-10-CM | POA: Diagnosis not present

## 2016-11-11 DIAGNOSIS — N186 End stage renal disease: Secondary | ICD-10-CM | POA: Diagnosis not present

## 2016-11-12 LAB — SURGICAL PATHOLOGY

## 2016-11-13 DIAGNOSIS — N186 End stage renal disease: Secondary | ICD-10-CM | POA: Diagnosis not present

## 2016-11-13 DIAGNOSIS — E119 Type 2 diabetes mellitus without complications: Secondary | ICD-10-CM | POA: Diagnosis not present

## 2016-11-13 DIAGNOSIS — D509 Iron deficiency anemia, unspecified: Secondary | ICD-10-CM | POA: Diagnosis not present

## 2016-11-13 DIAGNOSIS — E209 Hypoparathyroidism, unspecified: Secondary | ICD-10-CM | POA: Diagnosis not present

## 2016-11-13 DIAGNOSIS — D631 Anemia in chronic kidney disease: Secondary | ICD-10-CM | POA: Diagnosis not present

## 2016-11-13 DIAGNOSIS — N2581 Secondary hyperparathyroidism of renal origin: Secondary | ICD-10-CM | POA: Diagnosis not present

## 2016-11-15 DIAGNOSIS — E119 Type 2 diabetes mellitus without complications: Secondary | ICD-10-CM | POA: Diagnosis not present

## 2016-11-15 DIAGNOSIS — N186 End stage renal disease: Secondary | ICD-10-CM | POA: Diagnosis not present

## 2016-11-15 DIAGNOSIS — N2581 Secondary hyperparathyroidism of renal origin: Secondary | ICD-10-CM | POA: Diagnosis not present

## 2016-11-15 DIAGNOSIS — D509 Iron deficiency anemia, unspecified: Secondary | ICD-10-CM | POA: Diagnosis not present

## 2016-11-15 DIAGNOSIS — E209 Hypoparathyroidism, unspecified: Secondary | ICD-10-CM | POA: Diagnosis not present

## 2016-11-15 DIAGNOSIS — D631 Anemia in chronic kidney disease: Secondary | ICD-10-CM | POA: Diagnosis not present

## 2016-11-18 DIAGNOSIS — N186 End stage renal disease: Secondary | ICD-10-CM | POA: Diagnosis not present

## 2016-11-18 DIAGNOSIS — E119 Type 2 diabetes mellitus without complications: Secondary | ICD-10-CM | POA: Diagnosis not present

## 2016-11-18 DIAGNOSIS — E209 Hypoparathyroidism, unspecified: Secondary | ICD-10-CM | POA: Diagnosis not present

## 2016-11-18 DIAGNOSIS — N2581 Secondary hyperparathyroidism of renal origin: Secondary | ICD-10-CM | POA: Diagnosis not present

## 2016-11-18 DIAGNOSIS — D631 Anemia in chronic kidney disease: Secondary | ICD-10-CM | POA: Diagnosis not present

## 2016-11-18 DIAGNOSIS — D509 Iron deficiency anemia, unspecified: Secondary | ICD-10-CM | POA: Diagnosis not present

## 2016-11-20 DIAGNOSIS — E209 Hypoparathyroidism, unspecified: Secondary | ICD-10-CM | POA: Diagnosis not present

## 2016-11-20 DIAGNOSIS — N2581 Secondary hyperparathyroidism of renal origin: Secondary | ICD-10-CM | POA: Diagnosis not present

## 2016-11-20 DIAGNOSIS — N186 End stage renal disease: Secondary | ICD-10-CM | POA: Diagnosis not present

## 2016-11-20 DIAGNOSIS — D631 Anemia in chronic kidney disease: Secondary | ICD-10-CM | POA: Diagnosis not present

## 2016-11-20 DIAGNOSIS — D509 Iron deficiency anemia, unspecified: Secondary | ICD-10-CM | POA: Diagnosis not present

## 2016-11-20 DIAGNOSIS — E119 Type 2 diabetes mellitus without complications: Secondary | ICD-10-CM | POA: Diagnosis not present

## 2016-11-22 DIAGNOSIS — N186 End stage renal disease: Secondary | ICD-10-CM | POA: Diagnosis not present

## 2016-11-22 DIAGNOSIS — D509 Iron deficiency anemia, unspecified: Secondary | ICD-10-CM | POA: Diagnosis not present

## 2016-11-22 DIAGNOSIS — D631 Anemia in chronic kidney disease: Secondary | ICD-10-CM | POA: Diagnosis not present

## 2016-11-22 DIAGNOSIS — N2581 Secondary hyperparathyroidism of renal origin: Secondary | ICD-10-CM | POA: Diagnosis not present

## 2016-11-22 DIAGNOSIS — E119 Type 2 diabetes mellitus without complications: Secondary | ICD-10-CM | POA: Diagnosis not present

## 2016-11-22 DIAGNOSIS — E209 Hypoparathyroidism, unspecified: Secondary | ICD-10-CM | POA: Diagnosis not present

## 2016-11-25 DIAGNOSIS — E209 Hypoparathyroidism, unspecified: Secondary | ICD-10-CM | POA: Diagnosis not present

## 2016-11-25 DIAGNOSIS — N186 End stage renal disease: Secondary | ICD-10-CM | POA: Diagnosis not present

## 2016-11-25 DIAGNOSIS — D509 Iron deficiency anemia, unspecified: Secondary | ICD-10-CM | POA: Diagnosis not present

## 2016-11-25 DIAGNOSIS — N2581 Secondary hyperparathyroidism of renal origin: Secondary | ICD-10-CM | POA: Diagnosis not present

## 2016-11-25 DIAGNOSIS — E119 Type 2 diabetes mellitus without complications: Secondary | ICD-10-CM | POA: Diagnosis not present

## 2016-11-25 DIAGNOSIS — D631 Anemia in chronic kidney disease: Secondary | ICD-10-CM | POA: Diagnosis not present

## 2016-11-27 DIAGNOSIS — E119 Type 2 diabetes mellitus without complications: Secondary | ICD-10-CM | POA: Diagnosis not present

## 2016-11-27 DIAGNOSIS — D631 Anemia in chronic kidney disease: Secondary | ICD-10-CM | POA: Diagnosis not present

## 2016-11-27 DIAGNOSIS — N2581 Secondary hyperparathyroidism of renal origin: Secondary | ICD-10-CM | POA: Diagnosis not present

## 2016-11-27 DIAGNOSIS — D509 Iron deficiency anemia, unspecified: Secondary | ICD-10-CM | POA: Diagnosis not present

## 2016-11-27 DIAGNOSIS — E209 Hypoparathyroidism, unspecified: Secondary | ICD-10-CM | POA: Diagnosis not present

## 2016-11-27 DIAGNOSIS — N186 End stage renal disease: Secondary | ICD-10-CM | POA: Diagnosis not present

## 2016-11-28 ENCOUNTER — Encounter (INDEPENDENT_AMBULATORY_CARE_PROVIDER_SITE_OTHER): Payer: Medicare Other

## 2016-11-29 DIAGNOSIS — D631 Anemia in chronic kidney disease: Secondary | ICD-10-CM | POA: Diagnosis not present

## 2016-11-29 DIAGNOSIS — E209 Hypoparathyroidism, unspecified: Secondary | ICD-10-CM | POA: Diagnosis not present

## 2016-11-29 DIAGNOSIS — N186 End stage renal disease: Secondary | ICD-10-CM | POA: Diagnosis not present

## 2016-11-29 DIAGNOSIS — D509 Iron deficiency anemia, unspecified: Secondary | ICD-10-CM | POA: Diagnosis not present

## 2016-11-29 DIAGNOSIS — N2581 Secondary hyperparathyroidism of renal origin: Secondary | ICD-10-CM | POA: Diagnosis not present

## 2016-11-29 DIAGNOSIS — E119 Type 2 diabetes mellitus without complications: Secondary | ICD-10-CM | POA: Diagnosis not present

## 2016-12-02 ENCOUNTER — Encounter (HOSPITAL_BASED_OUTPATIENT_CLINIC_OR_DEPARTMENT_OTHER): Payer: Self-pay | Admitting: *Deleted

## 2016-12-02 ENCOUNTER — Emergency Department (HOSPITAL_BASED_OUTPATIENT_CLINIC_OR_DEPARTMENT_OTHER)
Admission: EM | Admit: 2016-12-02 | Discharge: 2016-12-03 | Disposition: A | Discharge status: Home / Self Care | Payer: Medicare Other | Attending: Emergency Medicine | Admitting: Emergency Medicine

## 2016-12-02 DIAGNOSIS — Z79899 Other long term (current) drug therapy: Secondary | ICD-10-CM | POA: Insufficient documentation

## 2016-12-02 DIAGNOSIS — I132 Hypertensive heart and chronic kidney disease with heart failure and with stage 5 chronic kidney disease, or end stage renal disease: Secondary | ICD-10-CM | POA: Insufficient documentation

## 2016-12-02 DIAGNOSIS — Z7982 Long term (current) use of aspirin: Secondary | ICD-10-CM | POA: Diagnosis not present

## 2016-12-02 DIAGNOSIS — I2510 Atherosclerotic heart disease of native coronary artery without angina pectoris: Secondary | ICD-10-CM | POA: Insufficient documentation

## 2016-12-02 DIAGNOSIS — E1122 Type 2 diabetes mellitus with diabetic chronic kidney disease: Secondary | ICD-10-CM | POA: Insufficient documentation

## 2016-12-02 DIAGNOSIS — D509 Iron deficiency anemia, unspecified: Secondary | ICD-10-CM | POA: Diagnosis not present

## 2016-12-02 DIAGNOSIS — E209 Hypoparathyroidism, unspecified: Secondary | ICD-10-CM | POA: Diagnosis not present

## 2016-12-02 DIAGNOSIS — Z794 Long term (current) use of insulin: Secondary | ICD-10-CM | POA: Insufficient documentation

## 2016-12-02 DIAGNOSIS — N2581 Secondary hyperparathyroidism of renal origin: Secondary | ICD-10-CM | POA: Diagnosis not present

## 2016-12-02 DIAGNOSIS — N186 End stage renal disease: Secondary | ICD-10-CM | POA: Diagnosis not present

## 2016-12-02 DIAGNOSIS — I509 Heart failure, unspecified: Secondary | ICD-10-CM | POA: Diagnosis not present

## 2016-12-02 DIAGNOSIS — Z992 Dependence on renal dialysis: Secondary | ICD-10-CM | POA: Insufficient documentation

## 2016-12-02 DIAGNOSIS — L0231 Cutaneous abscess of buttock: Secondary | ICD-10-CM

## 2016-12-02 DIAGNOSIS — D631 Anemia in chronic kidney disease: Secondary | ICD-10-CM | POA: Diagnosis not present

## 2016-12-02 DIAGNOSIS — E119 Type 2 diabetes mellitus without complications: Secondary | ICD-10-CM | POA: Diagnosis not present

## 2016-12-02 NOTE — ED Notes (Signed)
Left buttock with indurated area.

## 2016-12-02 NOTE — ED Provider Notes (Signed)
[facility] DEPT [facility] Provider Note   CSN: [id number] Arrival date & time: 12/02/16  2240  By signing my name below, I, Theresia Bough, attest that this documentation has been prepared under the direction and in the presence of non-physician practitioner, Tayte Childers, Harrell Gave, PA-C. Electronically Signed: Theresia Bough, ED Scribe. 12/02/16. 12:10 AM.  History   Chief Complaint Chief Complaint  Patient presents with  . Abscess   The history is provided by the patient. No language interpreter was used.   HPI Comments: [name] is a 51 y.o. male with a PMHx of DM, who presents to the Emergency Department complaining of a moderate, gradually worsening area of pain and swelling to the left buttock onset 2 days ago. Pt states pain is exacerbated with palpation and direct pressure. Pt has a hx of similar symptoms. No treatments tried prior to arrival. Pt denies fever, drainage from the area, or any other complaints at this time.   Past Medical History:  Diagnosis Date  . Amputee, below knee, left ([place])   . Anemia   . Blood transfusion   . Bronchitis   . Chest pain   . CHF (congestive heart failure) ([place])   . Chronic kidney disease    d[facility], [facility]  . Diabetes mellitus    type 2  . Dysrhythmia    irregular heartbeat  . ESRD (end stage renal disease) on dialysis ([place])    first HD 06/19/04  . GERD (gastroesophageal reflux disease)   . Hyperparathyroidism ([place])   . Morbid obesity [facility])     Patient Active Problem List   Diagnosis Date Noted  . Status post ankle fusion 05/23/2016  . Acute chest pain 09/12/2015  . Chest pain 09/12/2015  . Septic shock ([place])   . MRSA bacteremia   . Esophageal reflux   . Infection and inflammatory reaction due to cardiac device, implant, and graft ([place])   . Diabetes mellitus type 2, uncontrolled ([place]) 04/18/2015  . Status post below knee amputation of left lower extremity ([place]) 04/17/2015  . Atherosclerosis of native  arteries of the extremities with ulceration(440.23) 08/25/2013  . ESRD on dialysis ([place]) 10/23/2012  . DIASTOLIC DYSFUNCTION 19/62/2297  . PVD 10/22/2007  . ESRD 10/22/2007  . HYPERPARATHYROIDISM, HX OF 10/22/2007    Past Surgical History:  Procedure Laterality Date  . AMPUTATION Left 01/20/2015   Procedure: Left Great Toe Amputation;  Surgeon: Newt Minion, MD;  Location: [place];  Service: Orthopedics;  Laterality: Left;  . AMPUTATION Left 03/07/2015   Procedure: Left Foot 1st and 2nd Ray Amputation;  Surgeon: Newt Minion, MD;  Location: [place];  Service: Orthopedics;  Laterality: Left;  . AMPUTATION Left 04/14/2015   Procedure: LEFT BELOW THE KNEE AMPUTATION ;  Surgeon: Newt Minion, MD;  Location: [place];  Service: Orthopedics;  Laterality: Left;  . ANKLE FUSION     rt foot charcot joint 2008 with refusion  . AV FISTULA PLACEMENT  10/11/2010  . AV FISTULA PLACEMENT  07/02/2011   Procedure: INSERTION OF ARTERIOVENOUS (AV) GORE-TEX GRAFT ARM;  Surgeon: Elam Dutch, MD;  Location: [place];  Service: Vascular;  Laterality: Left;  . AV FISTULA PLACEMENT Right 11/06/2012   Procedure: INSERTION OF ARTERIOVENOUS (AV) GORE-TEX GRAFT ARM;  Surgeon: Mal Misty, MD;  Location: [place];  Service: Vascular;  Laterality: Right;  . AV FISTULA PLACEMENT Right 03/21/2016   Procedure: INSERTION OF ARTERIOVENOUS (AV) GORE-TEX GRAFT ARM ( LOOP FOREARM );  Surgeon: Corene Cornea  Bunnie Domino, MD;  Location: [facility];  Service: Vascular;  Laterality: Right;  . AV FISTULA PLACEMENT Left 11/07/2016   Procedure: INSERTION OF ARTERIOVENOUS (AV) Artegraft;  Surgeon: Algernon Huxley, MD;  Location: [facility];  Service: Vascular;  Laterality: Left;  . [place] REMOVAL Right 11/06/2012   Procedure: REMOVAL OF ARTERIOVENOUS GORETEX GRAFT ([place]);  Surgeon: Mal Misty, MD;  Location: [place];  Service: Vascular;  Laterality: Right;  . ENDARTERECTOMY FEMORAL  11/07/2016   Procedure: ENDARTERECTOMY FEMORAL;  Surgeon: Algernon Huxley, MD;  Location:  [facility];  Service: Vascular;;  . HERNIA REPAIR     Umbilical Hernia  . INSERTION OF DIALYSIS CATHETER  01/20/2012   Procedure: INSERTION OF DIALYSIS CATHETER;  Surgeon: Serafina Mitchell, MD;  Location: [place];  Service: Vascular;  Laterality: N/A;  insertion left internal jugular dialysis catheter 23cm  . INSERTION OF DIALYSIS CATHETER Right 11/09/2012   Procedure: INSERTION OF DIALYSIS CATHETER;  Surgeon: Conrad Plymouth, MD;  Location: [place];  Service: Vascular;  Laterality: Right;  Right  Femoral Vein  . INSERTION OF DIALYSIS CATHETER N/A 08/18/2015   Procedure: INSERTION OF DIALYSIS CATHETER RIGHT FEMORAL VEIN;  Surgeon: Mal Misty, MD;  Location: [place];  Service: Cardiovascular;  Laterality: N/A;  . PARATHYROIDECTOMY    . PERIPHERAL VASCULAR CATHETERIZATION Right 04/04/2016   Procedure: A/V Shuntogram/Fistulagram;  Surgeon: Algernon Huxley, MD;  Location: [place] [facility];  Service: Cardiovascular;  Laterality: Right;  . REMOVAL OF A HERO DEVICE Left 08/14/2015   Procedure: REMOVAL OF LEFT ARM HERO DEVICE;  Surgeon: Rosetta Posner, MD;  Location: [place];  Service: Vascular;  Laterality: Left;  . TEE WITHOUT CARDIOVERSION N/A 08/18/2015   Procedure: TRANSESOPHAGEAL ECHOCARDIOGRAM (TEE);  Surgeon: Mal Misty, MD;  Location: [place];  Service: Cardiovascular;  Laterality: N/A;  . TEE WITHOUT CARDIOVERSION N/A 12/21/2015   Procedure: TRANSESOPHAGEAL ECHOCARDIOGRAM (TEE);  Surgeon: Dixie Dials, MD;  Location: [facility];  Service: Cardiovascular;  Laterality: N/A;  . THROMBECTOMY    . Umbilcial Hernia    . UPPER EXTREMITY VENOGRAPHY Left 10/31/2016   Procedure: Upper Extremity Venography;  Surgeon: Algernon Huxley, MD;  Location: [place] [facility];  Service: Cardiovascular;  Laterality: Left;  Marland Kitchen VENOGRAM N/A 01/06/2014   Procedure: VENOGRAM;  Surgeon: Conrad , MD;  [facility];  Service: Cardiovascular;  Laterality: N/A;       Home Medications    Prior to Admission medications     Medication Sig Start Date End Date Taking? Authorizing Provider  aspirin EC 81 MG EC tablet Take 1 tablet (81 mg total) by mouth daily. 08/19/15   Burns, Arloa Koh, MD  AURYXIA 1 GM 210 MG(Fe) tablet Take 1 tablet by mouth 3 (three) times daily with meals. 10/24/16   [provider]  b complex-vitamin c-folic acid (NEPHRO-VITE) 0.8 MG TABS tablet Take 1 tablet by mouth at bedtime. 04/26/15   Angiulli, Lavon Paganini, PA-C  Dulaglutide (TRULICITY) 5.68 LE/7.5TZ SOPN Inject 0.75 mg into the skin every Friday.     [provider]  insulin detemir (LEVEMIR) 100 UNIT/ML injection Inject 0.2 mLs (20 Units total) into the skin at bedtime. Patient taking differently: Inject 5-20 Units into the skin at bedtime. Per sliding scale 04/26/15   Angiulli, Lavon Paganini, PA-C  oxyCODONE-acetaminophen (PERCOCET/ROXICET) 5-325 MG tablet Take 1-2 tablets by mouth every 4 (four) hours as needed for severe pain. 11/07/16   Algernon Huxley, MD  Family History Family History  Problem Relation Age of Onset  . Heart disease Mother   . Kidney disease Mother   . Hypertension Mother     Social History Social History  Substance Use Topics  . Smoking status: Former Smoker    Years: 20.00    Types: Cigarettes    Quit date: 01/13/2015  . Smokeless tobacco: Never Used     Comment: quit 2 months ago  . Alcohol use No     Allergies   Hydrocodone   Review of Systems Review of Systems All other systems negative except as documented in the HPI. All pertinent positives and negatives as reviewed in the HPI.  Physical Exam Updated Vital Signs BP (!) 147/75 (BP Location: Left Arm)   Pulse (!) 102   Temp 98.4 F (36.9 C) (Oral)   Resp 18   Ht 6' (1.829 m)   Wt 260 lb (117.9 kg)   SpO2 100%   BMI 35.26 kg/m   Physical Exam  Constitutional: He is oriented to person, place, and time. He appears well-developed and well-nourished. No distress.  HENT:  Head: Normocephalic and atraumatic.  Eyes: Pupils are  equal, round, and reactive to light.  Pulmonary/Chest: Effort normal.  Neurological: He is alert and oriented to person, place, and time.  Skin: Skin is warm and dry.  Abscess to the left medial lower buttock. There is a small area of fluctuance noted.   Psychiatric: He has a normal mood and affect.  Nursing note and vitals reviewed.    ED Treatments / Results  DIAGNOSTIC STUDIES: Oxygen Saturation is 100% on RA, normal by my interpretation.   COORDINATION OF CARE: 12:09 AM-Discussed next steps with pt including draining the area. Pt verbalized understanding and is agreeable with the plan.   Labs (all labs ordered are listed, but only abnormal results are displayed) Labs Reviewed - No data to display  EKG  EKG Interpretation None       Radiology No results found.  Procedures Procedures (including critical care time)  Medications Ordered in ED Medications - No data to display   Initial Impression / Assessment and Plan / ED Course  I have reviewed the triage vital signs and the nursing notes.  Pertinent labs & imaging results that were available during my care of the patient were reviewed by me and considered in my medical decision making (see chart for details).    INCISION AND DRAINAGE Performed by: [facility] General Consent: Verbal consent obtained. Risks and benefits: risks, benefits and alternatives were discussed Type: abscess  Body area: L Buttock   Anesthesia: local infiltration  Incision was made with a scalpel.  Local anesthetic: lidocaine 2% wo epinephrine  Anesthetic total: 6 ml  Complexity: complex Blunt dissection to break up loculations  Drainage: purulent  Drainage amount: moderate  Packing material: 1/4 in iodoform gauze  Patient tolerance: Patient tolerated the procedure well with no immediate complications.  Patient is advised to be rechecked in 2 days.  The patient is asked to use warm compresses around the area.  Told to  follow-up with his primary doctor.  Patient agrees the plan and all questions were answered   Final Clinical Impressions(s) / ED Diagnoses   Final diagnoses:  None    New Prescriptions New Prescriptions   No medications on file   I personally performed the services described in this documentation, which was scribed in my presence. The recorded information has been reviewed and is accurate.  Dalia Heading, PA-C 12/03/16 0043    Shanon Rosser, MD 12/03/16 [phone]

## 2016-12-02 NOTE — ED Triage Notes (Signed)
Boil to left buttock x 2-3 days.  Hx of same

## 2016-12-03 MED ORDER — DOXYCYCLINE HYCLATE 100 MG PO CAPS: 100.0000 mg | ORAL_CAPSULE | Freq: Two times a day (BID) | ORAL | 0 refills | Status: DC

## 2016-12-03 MED ORDER — OXYCODONE-ACETAMINOPHEN 5-325 MG PO TABS: 1.0000 | ORAL_TABLET | Freq: Four times a day (QID) | ORAL | 0 refills | Status: DC | PRN

## 2016-12-03 MED ORDER — LIDOCAINE HCL 2 % IJ SOLN
INTRAMUSCULAR | Status: AC
  Administered 2016-12-03: 20 mg
  Filled 2016-12-03: qty 20

## 2016-12-03 MED ORDER — OXYCODONE-ACETAMINOPHEN 5-325 MG PO TABS
1.0000 | ORAL_TABLET | Freq: Once | ORAL | Status: AC
  Administered 2016-12-03: 1 via ORAL
  Filled 2016-12-03: qty 1

## 2016-12-03 NOTE — Discharge Instructions (Signed)
Use heat around the area to promote further drainage.  have the area rechecked and packing removed in 2 days.  Return for any worsening in your condition

## 2016-12-04 ENCOUNTER — Other Ambulatory Visit (INDEPENDENT_AMBULATORY_CARE_PROVIDER_SITE_OTHER): Payer: Self-pay | Admitting: Vascular Surgery

## 2016-12-04 DIAGNOSIS — N186 End stage renal disease: Secondary | ICD-10-CM

## 2016-12-04 DIAGNOSIS — Z48812 Encounter for surgical aftercare following surgery on the circulatory system: Secondary | ICD-10-CM

## 2016-12-05 ENCOUNTER — Encounter (INDEPENDENT_AMBULATORY_CARE_PROVIDER_SITE_OTHER): Payer: Self-pay | Admitting: Vascular Surgery

## 2016-12-05 ENCOUNTER — Ambulatory Visit (INDEPENDENT_AMBULATORY_CARE_PROVIDER_SITE_OTHER): Payer: Medicare Other

## 2016-12-05 ENCOUNTER — Ambulatory Visit (INDEPENDENT_AMBULATORY_CARE_PROVIDER_SITE_OTHER): Payer: Medicare Other | Admitting: Vascular Surgery

## 2016-12-05 VITALS — BP 137/75 | HR 86 | Resp 16 | Ht 70.5 in | Wt 262.0 lb

## 2016-12-05 DIAGNOSIS — Z48812 Encounter for surgical aftercare following surgery on the circulatory system: Secondary | ICD-10-CM

## 2016-12-05 DIAGNOSIS — N186 End stage renal disease: Secondary | ICD-10-CM | POA: Diagnosis not present

## 2016-12-05 DIAGNOSIS — E1122 Type 2 diabetes mellitus with diabetic chronic kidney disease: Secondary | ICD-10-CM

## 2016-12-05 DIAGNOSIS — N185 Chronic kidney disease, stage 5: Secondary | ICD-10-CM

## 2016-12-05 DIAGNOSIS — Z09 Encounter for follow-up examination after completed treatment for conditions other than malignant neoplasm: Secondary | ICD-10-CM

## 2016-12-05 DIAGNOSIS — Z794 Long term (current) use of insulin: Secondary | ICD-10-CM

## 2016-12-05 DIAGNOSIS — IMO0002 Reserved for concepts with insufficient information to code with codable children: Secondary | ICD-10-CM

## 2016-12-05 DIAGNOSIS — E1165 Type 2 diabetes mellitus with hyperglycemia: Secondary | ICD-10-CM

## 2016-12-05 NOTE — Progress Notes (Signed)
Subjective:    Patient ID: [name], male    DOB: 1965/07/31, 51 y.o.   MRN: [id number] Chief Complaint  Patient presents with  . Re-evaluation    Ultrasound follow up   Patient presents for his first post-operative follow up. He is S/pa a left lower extremity loop graft placement on 11/07/16. His post-operative course is uneventful. Right groin permcath working well. No issues with incision. No fever, nausea or vomiting.    Review of Systems  Constitutional: Negative.   HENT: Negative.   Eyes: Negative.   Respiratory: Negative.   Cardiovascular: Negative.   Gastrointestinal: Negative.   Endocrine: Negative.   Genitourinary: Negative.   Musculoskeletal: Negative.   Skin: Negative.   Allergic/Immunologic: Negative.   Neurological: Negative.   Hematological: Negative.   Psychiatric/Behavioral: Negative.       Objective:   Physical Exam  Constitutional: He is oriented to person, place, and time. He appears well-developed and well-nourished. No distress.  HENT:  Head: Normocephalic and atraumatic.  Eyes: Conjunctivae are normal. Pupils are equal, round, and reactive to light.  Neck: Normal range of motion.  Cardiovascular: Normal rate, regular rhythm, normal heart sounds and intact distal pulses.   Pulses:      Radial pulses are 2+ on the right side, and 2+ on the left side.       Dorsalis pedis pulses are 2+ on the right side, and 2+ on the left side.       Posterior tibial pulses are 2+ on the right side, and 2+ on the left side.  Right Groin permcath: Intact, no signs of infection noted. Left Lower Extremity Access: good bruit and thrill, incision healing well.   Pulmonary/Chest: Effort normal.  Musculoskeletal: Normal range of motion. He exhibits no edema.  Neurological: He is alert and oriented to person, place, and time.  Skin: Skin is warm and dry. He is not diaphoretic.  Psychiatric: He has a normal mood and affect. His behavior is normal. Judgment and  thought content normal.  Vitals reviewed.  BP 137/75 (BP Location: Left Arm)   Pulse 86   Resp 16   Ht 5' 10.5" (1.791 m)   Wt 262 lb (118.8 kg)   BMI 37.06 kg/m   Past Medical History:  Diagnosis Date  . Amputee, below knee, left ([place])   . Anemia   . Blood transfusion   . Bronchitis   . Chest pain   . CHF (congestive heart failure) ([place])   . Chronic kidney disease    d[facility], [facility]  . Diabetes mellitus    type 2  . Dysrhythmia    irregular heartbeat  . ESRD (end stage renal disease) on dialysis ([place])    first HD 06/19/04  . GERD (gastroesophageal reflux disease)   . Hyperparathyroidism ([place])   . Morbid obesity ([place])    Social History   Social History  . Marital status: Married    Spouse name: N/A  . Number of children: N/A  . Years of education: N/A   Occupational History  . Not on file.   Social History Main Topics  . Smoking status: Former Smoker    Years: 20.00    Types: Cigarettes    Quit date: 01/13/2015  . Smokeless tobacco: Never Used     Comment: quit 2 months ago  . Alcohol use No  . Drug use: No  . Sexual activity: Not on file   Other Topics Concern  . Not on file  Social History Narrative  . No narrative on file   Past Surgical History:  Procedure Laterality Date  . AMPUTATION Left 01/20/2015   Procedure: Left Great Toe Amputation;  Surgeon: Newt Minion, MD;  Location: [place];  Service: Orthopedics;  Laterality: Left;  . AMPUTATION Left 03/07/2015   Procedure: Left Foot 1st and 2nd Ray Amputation;  Surgeon: Newt Minion, MD;  Location: [place];  Service: Orthopedics;  Laterality: Left;  . AMPUTATION Left 04/14/2015   Procedure: LEFT BELOW THE KNEE AMPUTATION ;  Surgeon: Newt Minion, MD;  Location: [place];  Service: Orthopedics;  Laterality: Left;  . ANKLE FUSION     rt foot charcot joint 2008 with refusion  . AV FISTULA PLACEMENT  10/11/2010  . AV FISTULA PLACEMENT  07/02/2011   Procedure: INSERTION OF ARTERIOVENOUS (AV) GORE-TEX  GRAFT ARM;  Surgeon: Elam Dutch, MD;  Location: [place];  Service: Vascular;  Laterality: Left;  . AV FISTULA PLACEMENT Right 11/06/2012   Procedure: INSERTION OF ARTERIOVENOUS (AV) GORE-TEX GRAFT ARM;  Surgeon: Mal Misty, MD;  Location: [place];  Service: Vascular;  Laterality: Right;  . AV FISTULA PLACEMENT Right 03/21/2016   Procedure: INSERTION OF ARTERIOVENOUS (AV) GORE-TEX GRAFT ARM ( LOOP FOREARM );  Surgeon: Algernon Huxley, MD;  Location: [facility];  Service: Vascular;  Laterality: Right;  . AV FISTULA PLACEMENT Left 11/07/2016   Procedure: INSERTION OF ARTERIOVENOUS (AV) Artegraft;  Surgeon: Algernon Huxley, MD;  Location: [facility];  Service: Vascular;  Laterality: Left;  . [place] REMOVAL Right 11/06/2012   Procedure: REMOVAL OF ARTERIOVENOUS GORETEX GRAFT ([place]);  Surgeon: Mal Misty, MD;  Location: [place];  Service: Vascular;  Laterality: Right;  . ENDARTERECTOMY FEMORAL  11/07/2016   Procedure: ENDARTERECTOMY FEMORAL;  Surgeon: Algernon Huxley, MD;  Location: [facility];  Service: Vascular;;  . HERNIA REPAIR     Umbilical Hernia  . INSERTION OF DIALYSIS CATHETER  01/20/2012   Procedure: INSERTION OF DIALYSIS CATHETER;  Surgeon: Serafina Mitchell, MD;  Location: [place];  Service: Vascular;  Laterality: N/A;  insertion left internal jugular dialysis catheter 23cm  . INSERTION OF DIALYSIS CATHETER Right 11/09/2012   Procedure: INSERTION OF DIALYSIS CATHETER;  Surgeon: Conrad Millington, MD;  Location: [place];  Service: Vascular;  Laterality: Right;  Right  Femoral Vein  . INSERTION OF DIALYSIS CATHETER N/A 08/18/2015   Procedure: INSERTION OF DIALYSIS CATHETER RIGHT FEMORAL VEIN;  Surgeon: Mal Misty, MD;  Location: [place];  Service: Cardiovascular;  Laterality: N/A;  . PARATHYROIDECTOMY    . PERIPHERAL VASCULAR CATHETERIZATION Right 04/04/2016   Procedure: A/V Shuntogram/Fistulagram;  Surgeon: Algernon Huxley, MD;  Location: [place] [facility];  Service: Cardiovascular;  Laterality: Right;  . REMOVAL OF A HERO  DEVICE Left 08/14/2015   Procedure: REMOVAL OF LEFT ARM HERO DEVICE;  Surgeon: Rosetta Posner, MD;  Location: [place];  Service: Vascular;  Laterality: Left;  . TEE WITHOUT CARDIOVERSION N/A 08/18/2015   Procedure: TRANSESOPHAGEAL ECHOCARDIOGRAM (TEE);  Surgeon: Mal Misty, MD;  Location: [place];  Service: Cardiovascular;  Laterality: N/A;  . TEE WITHOUT CARDIOVERSION N/A 12/21/2015   Procedure: TRANSESOPHAGEAL ECHOCARDIOGRAM (TEE);  Surgeon: Dixie Dials, MD;  Location: [facility];  Service: Cardiovascular;  Laterality: N/A;  . THROMBECTOMY    . Umbilcial Hernia    . UPPER EXTREMITY VENOGRAPHY Left 10/31/2016   Procedure: Upper Extremity Venography;  Surgeon: Algernon Huxley, MD;  Location: [place] [facility];  Service: Cardiovascular;  Laterality: Left;  Marland Kitchen VENOGRAM N/A 01/06/2014   Procedure: VENOGRAM;  Surgeon: Conrad Wolfdale, MD;  Location: [facility] LAB;  Service: Cardiovascular;  Laterality: N/A;   Family History  Problem Relation Age of Onset  . Heart disease Mother   . Kidney disease Mother   . Hypertension Mother    Allergies  Allergen Reactions  . Hydrocodone Nausea And Vomiting      Assessment & Plan:  Patient presents for his first post-operative follow up. He is S/pa a left lower extremity loop graft placement on 11/07/16. His post-operative course is uneventful. Right groin permcath working well. No issues with incision. No fever, nausea or vomiting.   1. ESRD - Stable OK to start using left lower extremity loop graft. Patient given letter stating OK to use graft. Dialysis center will contact our office when dialyzing through graft successfully to remove permcath.  Patient to follow up every six month for duplex  - VAS [place] (AVF,AVG); Future  2. Uncontrolled type 2 diabetes mellitus with stage 5 chronic kidney disease not on chronic dialysis, with long-term current use of insulin (HCC) - Stable Encouraged good control as its slows the progression of  atherosclerotic disease  3. Postop check - New As above  Current Outpatient Prescriptions on File Prior to Visit  Medication Sig Dispense Refill  . aspirin EC 81 MG EC tablet Take 1 tablet (81 mg total) by mouth daily. 30 tablet 11  . AURYXIA 1 GM 210 MG(Fe) tablet Take 1 tablet by mouth 3 (three) times daily with meals.    Marland Kitchen b complex-vitamin c-folic acid (NEPHRO-VITE) 0.8 MG TABS tablet Take 1 tablet by mouth at bedtime. 30 tablet 0  . doxycycline (VIBRAMYCIN) 100 MG capsule Take 1 capsule (100 mg total) by mouth 2 (two) times daily. 20 capsule 0  . Dulaglutide (TRULICITY) 0.98 JX/9.1YN SOPN Inject 0.75 mg into the skin every Friday.     . insulin detemir (LEVEMIR) 100 UNIT/ML injection Inject 0.2 mLs (20 Units total) into the skin at bedtime. (Patient taking differently: Inject 5-20 Units into the skin at bedtime. Per sliding scale) 10 mL 11  . oxyCODONE-acetaminophen (PERCOCET/ROXICET) 5-325 MG tablet Take 1 tablet by mouth every 6 (six) hours as needed for severe pain. 15 tablet 0   No current facility-administered medications on file prior to visit.     There are no Patient Instructions on file for this visit. No Follow-up on file.   Demi Trieu A Tylin Stradley, PA-C

## 2016-12-06 DIAGNOSIS — D631 Anemia in chronic kidney disease: Secondary | ICD-10-CM | POA: Diagnosis not present

## 2016-12-06 DIAGNOSIS — E119 Type 2 diabetes mellitus without complications: Secondary | ICD-10-CM | POA: Diagnosis not present

## 2016-12-06 DIAGNOSIS — E209 Hypoparathyroidism, unspecified: Secondary | ICD-10-CM | POA: Diagnosis not present

## 2016-12-06 DIAGNOSIS — N186 End stage renal disease: Secondary | ICD-10-CM | POA: Diagnosis not present

## 2016-12-06 DIAGNOSIS — N2581 Secondary hyperparathyroidism of renal origin: Secondary | ICD-10-CM | POA: Diagnosis not present

## 2016-12-06 DIAGNOSIS — D509 Iron deficiency anemia, unspecified: Secondary | ICD-10-CM | POA: Diagnosis not present

## 2016-12-09 DIAGNOSIS — N2581 Secondary hyperparathyroidism of renal origin: Secondary | ICD-10-CM | POA: Diagnosis not present

## 2016-12-09 DIAGNOSIS — E119 Type 2 diabetes mellitus without complications: Secondary | ICD-10-CM | POA: Diagnosis not present

## 2016-12-09 DIAGNOSIS — D509 Iron deficiency anemia, unspecified: Secondary | ICD-10-CM | POA: Diagnosis not present

## 2016-12-09 DIAGNOSIS — E209 Hypoparathyroidism, unspecified: Secondary | ICD-10-CM | POA: Diagnosis not present

## 2016-12-09 DIAGNOSIS — N186 End stage renal disease: Secondary | ICD-10-CM | POA: Diagnosis not present

## 2016-12-09 DIAGNOSIS — D631 Anemia in chronic kidney disease: Secondary | ICD-10-CM | POA: Diagnosis not present

## 2016-12-11 DIAGNOSIS — D509 Iron deficiency anemia, unspecified: Secondary | ICD-10-CM | POA: Diagnosis not present

## 2016-12-11 DIAGNOSIS — N2581 Secondary hyperparathyroidism of renal origin: Secondary | ICD-10-CM | POA: Diagnosis not present

## 2016-12-11 DIAGNOSIS — D631 Anemia in chronic kidney disease: Secondary | ICD-10-CM | POA: Diagnosis not present

## 2016-12-11 DIAGNOSIS — E209 Hypoparathyroidism, unspecified: Secondary | ICD-10-CM | POA: Diagnosis not present

## 2016-12-11 DIAGNOSIS — N186 End stage renal disease: Secondary | ICD-10-CM | POA: Diagnosis not present

## 2016-12-11 DIAGNOSIS — E119 Type 2 diabetes mellitus without complications: Secondary | ICD-10-CM | POA: Diagnosis not present

## 2016-12-12 DIAGNOSIS — N186 End stage renal disease: Secondary | ICD-10-CM | POA: Diagnosis not present

## 2016-12-12 DIAGNOSIS — Z992 Dependence on renal dialysis: Secondary | ICD-10-CM | POA: Diagnosis not present

## 2016-12-12 DIAGNOSIS — E1129 Type 2 diabetes mellitus with other diabetic kidney complication: Secondary | ICD-10-CM | POA: Diagnosis not present

## 2016-12-13 DIAGNOSIS — D509 Iron deficiency anemia, unspecified: Secondary | ICD-10-CM | POA: Diagnosis not present

## 2016-12-13 DIAGNOSIS — E209 Hypoparathyroidism, unspecified: Secondary | ICD-10-CM | POA: Diagnosis not present

## 2016-12-13 DIAGNOSIS — N2581 Secondary hyperparathyroidism of renal origin: Secondary | ICD-10-CM | POA: Diagnosis not present

## 2016-12-13 DIAGNOSIS — D631 Anemia in chronic kidney disease: Secondary | ICD-10-CM | POA: Diagnosis not present

## 2016-12-13 DIAGNOSIS — N186 End stage renal disease: Secondary | ICD-10-CM | POA: Diagnosis not present

## 2016-12-16 DIAGNOSIS — D509 Iron deficiency anemia, unspecified: Secondary | ICD-10-CM | POA: Diagnosis not present

## 2016-12-16 DIAGNOSIS — N186 End stage renal disease: Secondary | ICD-10-CM | POA: Diagnosis not present

## 2016-12-16 DIAGNOSIS — N2581 Secondary hyperparathyroidism of renal origin: Secondary | ICD-10-CM | POA: Diagnosis not present

## 2016-12-16 DIAGNOSIS — D631 Anemia in chronic kidney disease: Secondary | ICD-10-CM | POA: Diagnosis not present

## 2016-12-16 DIAGNOSIS — E209 Hypoparathyroidism, unspecified: Secondary | ICD-10-CM | POA: Diagnosis not present

## 2016-12-18 DIAGNOSIS — E209 Hypoparathyroidism, unspecified: Secondary | ICD-10-CM | POA: Diagnosis not present

## 2016-12-18 DIAGNOSIS — N2581 Secondary hyperparathyroidism of renal origin: Secondary | ICD-10-CM | POA: Diagnosis not present

## 2016-12-18 DIAGNOSIS — D509 Iron deficiency anemia, unspecified: Secondary | ICD-10-CM | POA: Diagnosis not present

## 2016-12-18 DIAGNOSIS — D631 Anemia in chronic kidney disease: Secondary | ICD-10-CM | POA: Diagnosis not present

## 2016-12-18 DIAGNOSIS — N186 End stage renal disease: Secondary | ICD-10-CM | POA: Diagnosis not present

## 2016-12-20 DIAGNOSIS — N186 End stage renal disease: Secondary | ICD-10-CM | POA: Diagnosis not present

## 2016-12-20 DIAGNOSIS — D509 Iron deficiency anemia, unspecified: Secondary | ICD-10-CM | POA: Diagnosis not present

## 2016-12-20 DIAGNOSIS — D631 Anemia in chronic kidney disease: Secondary | ICD-10-CM | POA: Diagnosis not present

## 2016-12-20 DIAGNOSIS — E209 Hypoparathyroidism, unspecified: Secondary | ICD-10-CM | POA: Diagnosis not present

## 2016-12-20 DIAGNOSIS — N2581 Secondary hyperparathyroidism of renal origin: Secondary | ICD-10-CM | POA: Diagnosis not present

## 2016-12-23 DIAGNOSIS — D509 Iron deficiency anemia, unspecified: Secondary | ICD-10-CM | POA: Diagnosis not present

## 2016-12-23 DIAGNOSIS — E209 Hypoparathyroidism, unspecified: Secondary | ICD-10-CM | POA: Diagnosis not present

## 2016-12-23 DIAGNOSIS — N2581 Secondary hyperparathyroidism of renal origin: Secondary | ICD-10-CM | POA: Diagnosis not present

## 2016-12-23 DIAGNOSIS — D631 Anemia in chronic kidney disease: Secondary | ICD-10-CM | POA: Diagnosis not present

## 2016-12-23 DIAGNOSIS — N186 End stage renal disease: Secondary | ICD-10-CM | POA: Diagnosis not present

## 2016-12-24 DIAGNOSIS — M545 Low back pain: Secondary | ICD-10-CM | POA: Diagnosis not present

## 2016-12-24 DIAGNOSIS — G8929 Other chronic pain: Secondary | ICD-10-CM | POA: Diagnosis not present

## 2016-12-24 DIAGNOSIS — G546 Phantom limb syndrome with pain: Secondary | ICD-10-CM | POA: Diagnosis not present

## 2016-12-24 DIAGNOSIS — M25571 Pain in right ankle and joints of right foot: Secondary | ICD-10-CM | POA: Diagnosis not present

## 2016-12-25 DIAGNOSIS — N2581 Secondary hyperparathyroidism of renal origin: Secondary | ICD-10-CM | POA: Diagnosis not present

## 2016-12-25 DIAGNOSIS — D631 Anemia in chronic kidney disease: Secondary | ICD-10-CM | POA: Diagnosis not present

## 2016-12-25 DIAGNOSIS — E209 Hypoparathyroidism, unspecified: Secondary | ICD-10-CM | POA: Diagnosis not present

## 2016-12-25 DIAGNOSIS — D509 Iron deficiency anemia, unspecified: Secondary | ICD-10-CM | POA: Diagnosis not present

## 2016-12-25 DIAGNOSIS — N186 End stage renal disease: Secondary | ICD-10-CM | POA: Diagnosis not present

## 2016-12-27 DIAGNOSIS — N186 End stage renal disease: Secondary | ICD-10-CM | POA: Diagnosis not present

## 2016-12-27 DIAGNOSIS — N2581 Secondary hyperparathyroidism of renal origin: Secondary | ICD-10-CM | POA: Diagnosis not present

## 2016-12-27 DIAGNOSIS — E209 Hypoparathyroidism, unspecified: Secondary | ICD-10-CM | POA: Diagnosis not present

## 2016-12-27 DIAGNOSIS — D509 Iron deficiency anemia, unspecified: Secondary | ICD-10-CM | POA: Diagnosis not present

## 2016-12-27 DIAGNOSIS — D631 Anemia in chronic kidney disease: Secondary | ICD-10-CM | POA: Diagnosis not present

## 2016-12-30 DIAGNOSIS — N186 End stage renal disease: Secondary | ICD-10-CM | POA: Diagnosis not present

## 2016-12-30 DIAGNOSIS — E209 Hypoparathyroidism, unspecified: Secondary | ICD-10-CM | POA: Diagnosis not present

## 2016-12-30 DIAGNOSIS — N2581 Secondary hyperparathyroidism of renal origin: Secondary | ICD-10-CM | POA: Diagnosis not present

## 2016-12-30 DIAGNOSIS — D509 Iron deficiency anemia, unspecified: Secondary | ICD-10-CM | POA: Diagnosis not present

## 2016-12-30 DIAGNOSIS — D631 Anemia in chronic kidney disease: Secondary | ICD-10-CM | POA: Diagnosis not present

## 2016-12-31 DIAGNOSIS — I871 Compression of vein: Secondary | ICD-10-CM | POA: Diagnosis not present

## 2016-12-31 DIAGNOSIS — N186 End stage renal disease: Secondary | ICD-10-CM | POA: Diagnosis not present

## 2016-12-31 DIAGNOSIS — T82868A Thrombosis of vascular prosthetic devices, implants and grafts, initial encounter: Secondary | ICD-10-CM | POA: Diagnosis not present

## 2016-12-31 DIAGNOSIS — Z992 Dependence on renal dialysis: Secondary | ICD-10-CM | POA: Diagnosis not present

## 2017-01-01 DIAGNOSIS — N2581 Secondary hyperparathyroidism of renal origin: Secondary | ICD-10-CM | POA: Diagnosis not present

## 2017-01-01 DIAGNOSIS — D509 Iron deficiency anemia, unspecified: Secondary | ICD-10-CM | POA: Diagnosis not present

## 2017-01-01 DIAGNOSIS — E209 Hypoparathyroidism, unspecified: Secondary | ICD-10-CM | POA: Diagnosis not present

## 2017-01-01 DIAGNOSIS — D631 Anemia in chronic kidney disease: Secondary | ICD-10-CM | POA: Diagnosis not present

## 2017-01-01 DIAGNOSIS — N186 End stage renal disease: Secondary | ICD-10-CM | POA: Diagnosis not present

## 2017-01-03 DIAGNOSIS — D509 Iron deficiency anemia, unspecified: Secondary | ICD-10-CM | POA: Diagnosis not present

## 2017-01-03 DIAGNOSIS — E209 Hypoparathyroidism, unspecified: Secondary | ICD-10-CM | POA: Diagnosis not present

## 2017-01-03 DIAGNOSIS — N186 End stage renal disease: Secondary | ICD-10-CM | POA: Diagnosis not present

## 2017-01-03 DIAGNOSIS — D631 Anemia in chronic kidney disease: Secondary | ICD-10-CM | POA: Diagnosis not present

## 2017-01-03 DIAGNOSIS — N2581 Secondary hyperparathyroidism of renal origin: Secondary | ICD-10-CM | POA: Diagnosis not present

## 2017-01-06 DIAGNOSIS — D631 Anemia in chronic kidney disease: Secondary | ICD-10-CM | POA: Diagnosis not present

## 2017-01-06 DIAGNOSIS — E209 Hypoparathyroidism, unspecified: Secondary | ICD-10-CM | POA: Diagnosis not present

## 2017-01-06 DIAGNOSIS — D509 Iron deficiency anemia, unspecified: Secondary | ICD-10-CM | POA: Diagnosis not present

## 2017-01-06 DIAGNOSIS — N2581 Secondary hyperparathyroidism of renal origin: Secondary | ICD-10-CM | POA: Diagnosis not present

## 2017-01-06 DIAGNOSIS — N186 End stage renal disease: Secondary | ICD-10-CM | POA: Diagnosis not present

## 2017-01-08 DIAGNOSIS — E209 Hypoparathyroidism, unspecified: Secondary | ICD-10-CM | POA: Diagnosis not present

## 2017-01-08 DIAGNOSIS — N2581 Secondary hyperparathyroidism of renal origin: Secondary | ICD-10-CM | POA: Diagnosis not present

## 2017-01-08 DIAGNOSIS — N186 End stage renal disease: Secondary | ICD-10-CM | POA: Diagnosis not present

## 2017-01-08 DIAGNOSIS — D631 Anemia in chronic kidney disease: Secondary | ICD-10-CM | POA: Diagnosis not present

## 2017-01-08 DIAGNOSIS — D509 Iron deficiency anemia, unspecified: Secondary | ICD-10-CM | POA: Diagnosis not present

## 2017-01-10 DIAGNOSIS — N186 End stage renal disease: Secondary | ICD-10-CM | POA: Diagnosis not present

## 2017-01-10 DIAGNOSIS — N2581 Secondary hyperparathyroidism of renal origin: Secondary | ICD-10-CM | POA: Diagnosis not present

## 2017-01-10 DIAGNOSIS — D631 Anemia in chronic kidney disease: Secondary | ICD-10-CM | POA: Diagnosis not present

## 2017-01-10 DIAGNOSIS — E209 Hypoparathyroidism, unspecified: Secondary | ICD-10-CM | POA: Diagnosis not present

## 2017-01-10 DIAGNOSIS — D509 Iron deficiency anemia, unspecified: Secondary | ICD-10-CM | POA: Diagnosis not present

## 2017-01-11 DIAGNOSIS — Z992 Dependence on renal dialysis: Secondary | ICD-10-CM | POA: Diagnosis not present

## 2017-01-11 DIAGNOSIS — N186 End stage renal disease: Secondary | ICD-10-CM | POA: Diagnosis not present

## 2017-01-11 DIAGNOSIS — E1129 Type 2 diabetes mellitus with other diabetic kidney complication: Secondary | ICD-10-CM | POA: Diagnosis not present

## 2017-01-13 DIAGNOSIS — N186 End stage renal disease: Secondary | ICD-10-CM | POA: Diagnosis not present

## 2017-01-13 DIAGNOSIS — D631 Anemia in chronic kidney disease: Secondary | ICD-10-CM | POA: Diagnosis not present

## 2017-01-13 DIAGNOSIS — E119 Type 2 diabetes mellitus without complications: Secondary | ICD-10-CM | POA: Diagnosis not present

## 2017-01-13 DIAGNOSIS — D509 Iron deficiency anemia, unspecified: Secondary | ICD-10-CM | POA: Diagnosis not present

## 2017-01-13 DIAGNOSIS — E209 Hypoparathyroidism, unspecified: Secondary | ICD-10-CM | POA: Diagnosis not present

## 2017-01-13 DIAGNOSIS — N2581 Secondary hyperparathyroidism of renal origin: Secondary | ICD-10-CM | POA: Diagnosis not present

## 2017-01-15 DIAGNOSIS — N186 End stage renal disease: Secondary | ICD-10-CM | POA: Diagnosis not present

## 2017-01-15 DIAGNOSIS — D509 Iron deficiency anemia, unspecified: Secondary | ICD-10-CM | POA: Diagnosis not present

## 2017-01-15 DIAGNOSIS — N2581 Secondary hyperparathyroidism of renal origin: Secondary | ICD-10-CM | POA: Diagnosis not present

## 2017-01-15 DIAGNOSIS — E119 Type 2 diabetes mellitus without complications: Secondary | ICD-10-CM | POA: Diagnosis not present

## 2017-01-15 DIAGNOSIS — E209 Hypoparathyroidism, unspecified: Secondary | ICD-10-CM | POA: Diagnosis not present

## 2017-01-15 DIAGNOSIS — D631 Anemia in chronic kidney disease: Secondary | ICD-10-CM | POA: Diagnosis not present

## 2017-01-16 ENCOUNTER — Telehealth (INDEPENDENT_AMBULATORY_CARE_PROVIDER_SITE_OTHER): Payer: Self-pay | Admitting: Orthopedic Surgery

## 2017-01-16 NOTE — Telephone Encounter (Signed)
PLEASE CALL PT AT YOUR EARLIEST CONVENIENCE REGARDING A REFERRAL.   [phone]

## 2017-01-17 ENCOUNTER — Other Ambulatory Visit (INDEPENDENT_AMBULATORY_CARE_PROVIDER_SITE_OTHER): Payer: Self-pay

## 2017-01-17 DIAGNOSIS — E119 Type 2 diabetes mellitus without complications: Secondary | ICD-10-CM | POA: Diagnosis not present

## 2017-01-17 DIAGNOSIS — D631 Anemia in chronic kidney disease: Secondary | ICD-10-CM | POA: Diagnosis not present

## 2017-01-17 DIAGNOSIS — D509 Iron deficiency anemia, unspecified: Secondary | ICD-10-CM | POA: Diagnosis not present

## 2017-01-17 DIAGNOSIS — N2581 Secondary hyperparathyroidism of renal origin: Secondary | ICD-10-CM | POA: Diagnosis not present

## 2017-01-17 DIAGNOSIS — E209 Hypoparathyroidism, unspecified: Secondary | ICD-10-CM | POA: Diagnosis not present

## 2017-01-17 DIAGNOSIS — N186 End stage renal disease: Secondary | ICD-10-CM | POA: Diagnosis not present

## 2017-01-17 NOTE — Telephone Encounter (Signed)
I called pt and he is wanting the referral that we made for him back in Nov to pain management . I advised that I will speak with Dr. Sharol Given about this to see if he wants to make the referral.

## 2017-01-20 DIAGNOSIS — N186 End stage renal disease: Secondary | ICD-10-CM | POA: Diagnosis not present

## 2017-01-20 DIAGNOSIS — D631 Anemia in chronic kidney disease: Secondary | ICD-10-CM | POA: Diagnosis not present

## 2017-01-20 DIAGNOSIS — E119 Type 2 diabetes mellitus without complications: Secondary | ICD-10-CM | POA: Diagnosis not present

## 2017-01-20 DIAGNOSIS — N2581 Secondary hyperparathyroidism of renal origin: Secondary | ICD-10-CM | POA: Diagnosis not present

## 2017-01-20 DIAGNOSIS — E209 Hypoparathyroidism, unspecified: Secondary | ICD-10-CM | POA: Diagnosis not present

## 2017-01-20 DIAGNOSIS — Z452 Encounter for adjustment and management of vascular access device: Secondary | ICD-10-CM | POA: Diagnosis not present

## 2017-01-20 DIAGNOSIS — D509 Iron deficiency anemia, unspecified: Secondary | ICD-10-CM | POA: Diagnosis not present

## 2017-01-22 DIAGNOSIS — E209 Hypoparathyroidism, unspecified: Secondary | ICD-10-CM | POA: Diagnosis not present

## 2017-01-22 DIAGNOSIS — D509 Iron deficiency anemia, unspecified: Secondary | ICD-10-CM | POA: Diagnosis not present

## 2017-01-22 DIAGNOSIS — E119 Type 2 diabetes mellitus without complications: Secondary | ICD-10-CM | POA: Diagnosis not present

## 2017-01-22 DIAGNOSIS — D631 Anemia in chronic kidney disease: Secondary | ICD-10-CM | POA: Diagnosis not present

## 2017-01-22 DIAGNOSIS — N2581 Secondary hyperparathyroidism of renal origin: Secondary | ICD-10-CM | POA: Diagnosis not present

## 2017-01-22 DIAGNOSIS — N186 End stage renal disease: Secondary | ICD-10-CM | POA: Diagnosis not present

## 2017-01-24 ENCOUNTER — Other Ambulatory Visit (INDEPENDENT_AMBULATORY_CARE_PROVIDER_SITE_OTHER): Payer: Self-pay

## 2017-01-24 DIAGNOSIS — E209 Hypoparathyroidism, unspecified: Secondary | ICD-10-CM | POA: Diagnosis not present

## 2017-01-24 DIAGNOSIS — D631 Anemia in chronic kidney disease: Secondary | ICD-10-CM | POA: Diagnosis not present

## 2017-01-24 DIAGNOSIS — Z89512 Acquired absence of left leg below knee: Secondary | ICD-10-CM

## 2017-01-24 DIAGNOSIS — E119 Type 2 diabetes mellitus without complications: Secondary | ICD-10-CM | POA: Diagnosis not present

## 2017-01-24 DIAGNOSIS — N2581 Secondary hyperparathyroidism of renal origin: Secondary | ICD-10-CM | POA: Diagnosis not present

## 2017-01-24 DIAGNOSIS — N186 End stage renal disease: Secondary | ICD-10-CM | POA: Diagnosis not present

## 2017-01-24 DIAGNOSIS — D509 Iron deficiency anemia, unspecified: Secondary | ICD-10-CM | POA: Diagnosis not present

## 2017-01-24 NOTE — Telephone Encounter (Signed)
Order in chart for pain management referral.

## 2017-01-27 DIAGNOSIS — N2581 Secondary hyperparathyroidism of renal origin: Secondary | ICD-10-CM | POA: Diagnosis not present

## 2017-01-27 DIAGNOSIS — D509 Iron deficiency anemia, unspecified: Secondary | ICD-10-CM | POA: Diagnosis not present

## 2017-01-27 DIAGNOSIS — E209 Hypoparathyroidism, unspecified: Secondary | ICD-10-CM | POA: Diagnosis not present

## 2017-01-27 DIAGNOSIS — E119 Type 2 diabetes mellitus without complications: Secondary | ICD-10-CM | POA: Diagnosis not present

## 2017-01-27 DIAGNOSIS — N186 End stage renal disease: Secondary | ICD-10-CM | POA: Diagnosis not present

## 2017-01-27 DIAGNOSIS — D631 Anemia in chronic kidney disease: Secondary | ICD-10-CM | POA: Diagnosis not present

## 2017-01-29 DIAGNOSIS — E119 Type 2 diabetes mellitus without complications: Secondary | ICD-10-CM | POA: Diagnosis not present

## 2017-01-29 DIAGNOSIS — D509 Iron deficiency anemia, unspecified: Secondary | ICD-10-CM | POA: Diagnosis not present

## 2017-01-29 DIAGNOSIS — E209 Hypoparathyroidism, unspecified: Secondary | ICD-10-CM | POA: Diagnosis not present

## 2017-01-29 DIAGNOSIS — N2581 Secondary hyperparathyroidism of renal origin: Secondary | ICD-10-CM | POA: Diagnosis not present

## 2017-01-29 DIAGNOSIS — N186 End stage renal disease: Secondary | ICD-10-CM | POA: Diagnosis not present

## 2017-01-29 DIAGNOSIS — D631 Anemia in chronic kidney disease: Secondary | ICD-10-CM | POA: Diagnosis not present

## 2017-01-31 DIAGNOSIS — D631 Anemia in chronic kidney disease: Secondary | ICD-10-CM | POA: Diagnosis not present

## 2017-01-31 DIAGNOSIS — E119 Type 2 diabetes mellitus without complications: Secondary | ICD-10-CM | POA: Diagnosis not present

## 2017-01-31 DIAGNOSIS — N186 End stage renal disease: Secondary | ICD-10-CM | POA: Diagnosis not present

## 2017-01-31 DIAGNOSIS — E209 Hypoparathyroidism, unspecified: Secondary | ICD-10-CM | POA: Diagnosis not present

## 2017-01-31 DIAGNOSIS — D509 Iron deficiency anemia, unspecified: Secondary | ICD-10-CM | POA: Diagnosis not present

## 2017-01-31 DIAGNOSIS — N2581 Secondary hyperparathyroidism of renal origin: Secondary | ICD-10-CM | POA: Diagnosis not present

## 2017-02-03 DIAGNOSIS — D509 Iron deficiency anemia, unspecified: Secondary | ICD-10-CM | POA: Diagnosis not present

## 2017-02-03 DIAGNOSIS — D631 Anemia in chronic kidney disease: Secondary | ICD-10-CM | POA: Diagnosis not present

## 2017-02-03 DIAGNOSIS — E209 Hypoparathyroidism, unspecified: Secondary | ICD-10-CM | POA: Diagnosis not present

## 2017-02-03 DIAGNOSIS — N2581 Secondary hyperparathyroidism of renal origin: Secondary | ICD-10-CM | POA: Diagnosis not present

## 2017-02-03 DIAGNOSIS — N186 End stage renal disease: Secondary | ICD-10-CM | POA: Diagnosis not present

## 2017-02-03 DIAGNOSIS — E119 Type 2 diabetes mellitus without complications: Secondary | ICD-10-CM | POA: Diagnosis not present

## 2017-02-05 DIAGNOSIS — E1129 Type 2 diabetes mellitus with other diabetic kidney complication: Secondary | ICD-10-CM | POA: Diagnosis not present

## 2017-02-05 DIAGNOSIS — E209 Hypoparathyroidism, unspecified: Secondary | ICD-10-CM | POA: Diagnosis not present

## 2017-02-05 DIAGNOSIS — D509 Iron deficiency anemia, unspecified: Secondary | ICD-10-CM | POA: Diagnosis not present

## 2017-02-05 DIAGNOSIS — N186 End stage renal disease: Secondary | ICD-10-CM | POA: Diagnosis not present

## 2017-02-05 DIAGNOSIS — E119 Type 2 diabetes mellitus without complications: Secondary | ICD-10-CM | POA: Diagnosis not present

## 2017-02-05 DIAGNOSIS — D631 Anemia in chronic kidney disease: Secondary | ICD-10-CM | POA: Diagnosis not present

## 2017-02-05 DIAGNOSIS — N2581 Secondary hyperparathyroidism of renal origin: Secondary | ICD-10-CM | POA: Diagnosis not present

## 2017-02-07 DIAGNOSIS — E209 Hypoparathyroidism, unspecified: Secondary | ICD-10-CM | POA: Diagnosis not present

## 2017-02-07 DIAGNOSIS — D509 Iron deficiency anemia, unspecified: Secondary | ICD-10-CM | POA: Diagnosis not present

## 2017-02-07 DIAGNOSIS — N2581 Secondary hyperparathyroidism of renal origin: Secondary | ICD-10-CM | POA: Diagnosis not present

## 2017-02-07 DIAGNOSIS — D631 Anemia in chronic kidney disease: Secondary | ICD-10-CM | POA: Diagnosis not present

## 2017-02-07 DIAGNOSIS — N186 End stage renal disease: Secondary | ICD-10-CM | POA: Diagnosis not present

## 2017-02-07 DIAGNOSIS — E119 Type 2 diabetes mellitus without complications: Secondary | ICD-10-CM | POA: Diagnosis not present

## 2017-02-10 DIAGNOSIS — E209 Hypoparathyroidism, unspecified: Secondary | ICD-10-CM | POA: Diagnosis not present

## 2017-02-10 DIAGNOSIS — D509 Iron deficiency anemia, unspecified: Secondary | ICD-10-CM | POA: Diagnosis not present

## 2017-02-10 DIAGNOSIS — D631 Anemia in chronic kidney disease: Secondary | ICD-10-CM | POA: Diagnosis not present

## 2017-02-10 DIAGNOSIS — E119 Type 2 diabetes mellitus without complications: Secondary | ICD-10-CM | POA: Diagnosis not present

## 2017-02-10 DIAGNOSIS — N186 End stage renal disease: Secondary | ICD-10-CM | POA: Diagnosis not present

## 2017-02-10 DIAGNOSIS — N2581 Secondary hyperparathyroidism of renal origin: Secondary | ICD-10-CM | POA: Diagnosis not present

## 2017-02-11 DIAGNOSIS — N186 End stage renal disease: Secondary | ICD-10-CM | POA: Diagnosis not present

## 2017-02-11 DIAGNOSIS — E1129 Type 2 diabetes mellitus with other diabetic kidney complication: Secondary | ICD-10-CM | POA: Diagnosis not present

## 2017-02-11 DIAGNOSIS — Z992 Dependence on renal dialysis: Secondary | ICD-10-CM | POA: Diagnosis not present

## 2017-02-12 DIAGNOSIS — N186 End stage renal disease: Secondary | ICD-10-CM | POA: Diagnosis not present

## 2017-02-12 DIAGNOSIS — N2581 Secondary hyperparathyroidism of renal origin: Secondary | ICD-10-CM | POA: Diagnosis not present

## 2017-02-12 DIAGNOSIS — E209 Hypoparathyroidism, unspecified: Secondary | ICD-10-CM | POA: Diagnosis not present

## 2017-02-12 DIAGNOSIS — D509 Iron deficiency anemia, unspecified: Secondary | ICD-10-CM | POA: Diagnosis not present

## 2017-02-12 DIAGNOSIS — D631 Anemia in chronic kidney disease: Secondary | ICD-10-CM | POA: Diagnosis not present

## 2017-02-14 DIAGNOSIS — N2581 Secondary hyperparathyroidism of renal origin: Secondary | ICD-10-CM | POA: Diagnosis not present

## 2017-02-14 DIAGNOSIS — D631 Anemia in chronic kidney disease: Secondary | ICD-10-CM | POA: Diagnosis not present

## 2017-02-14 DIAGNOSIS — D509 Iron deficiency anemia, unspecified: Secondary | ICD-10-CM | POA: Diagnosis not present

## 2017-02-14 DIAGNOSIS — N186 End stage renal disease: Secondary | ICD-10-CM | POA: Diagnosis not present

## 2017-02-14 DIAGNOSIS — E209 Hypoparathyroidism, unspecified: Secondary | ICD-10-CM | POA: Diagnosis not present

## 2017-02-17 DIAGNOSIS — N2581 Secondary hyperparathyroidism of renal origin: Secondary | ICD-10-CM | POA: Diagnosis not present

## 2017-02-17 DIAGNOSIS — D509 Iron deficiency anemia, unspecified: Secondary | ICD-10-CM | POA: Diagnosis not present

## 2017-02-17 DIAGNOSIS — N186 End stage renal disease: Secondary | ICD-10-CM | POA: Diagnosis not present

## 2017-02-17 DIAGNOSIS — E209 Hypoparathyroidism, unspecified: Secondary | ICD-10-CM | POA: Diagnosis not present

## 2017-02-17 DIAGNOSIS — D631 Anemia in chronic kidney disease: Secondary | ICD-10-CM | POA: Diagnosis not present

## 2017-02-19 DIAGNOSIS — E209 Hypoparathyroidism, unspecified: Secondary | ICD-10-CM | POA: Diagnosis not present

## 2017-02-19 DIAGNOSIS — N186 End stage renal disease: Secondary | ICD-10-CM | POA: Diagnosis not present

## 2017-02-19 DIAGNOSIS — N2581 Secondary hyperparathyroidism of renal origin: Secondary | ICD-10-CM | POA: Diagnosis not present

## 2017-02-19 DIAGNOSIS — D509 Iron deficiency anemia, unspecified: Secondary | ICD-10-CM | POA: Diagnosis not present

## 2017-02-19 DIAGNOSIS — D631 Anemia in chronic kidney disease: Secondary | ICD-10-CM | POA: Diagnosis not present

## 2017-02-20 DIAGNOSIS — M79605 Pain in left leg: Secondary | ICD-10-CM | POA: Diagnosis not present

## 2017-02-20 DIAGNOSIS — F17200 Nicotine dependence, unspecified, uncomplicated: Secondary | ICD-10-CM | POA: Diagnosis not present

## 2017-02-20 DIAGNOSIS — E1165 Type 2 diabetes mellitus with hyperglycemia: Secondary | ICD-10-CM | POA: Diagnosis not present

## 2017-02-20 DIAGNOSIS — Z794 Long term (current) use of insulin: Secondary | ICD-10-CM | POA: Diagnosis not present

## 2017-02-20 DIAGNOSIS — E559 Vitamin D deficiency, unspecified: Secondary | ICD-10-CM | POA: Diagnosis not present

## 2017-02-20 DIAGNOSIS — M129 Arthropathy, unspecified: Secondary | ICD-10-CM | POA: Diagnosis not present

## 2017-02-20 DIAGNOSIS — Z87891 Personal history of nicotine dependence: Secondary | ICD-10-CM | POA: Diagnosis not present

## 2017-02-20 DIAGNOSIS — Z0000 Encounter for general adult medical examination without abnormal findings: Secondary | ICD-10-CM | POA: Diagnosis not present

## 2017-02-20 DIAGNOSIS — E118 Type 2 diabetes mellitus with unspecified complications: Secondary | ICD-10-CM | POA: Diagnosis not present

## 2017-02-20 DIAGNOSIS — R5383 Other fatigue: Secondary | ICD-10-CM | POA: Diagnosis not present

## 2017-02-20 DIAGNOSIS — R0602 Shortness of breath: Secondary | ICD-10-CM | POA: Diagnosis not present

## 2017-02-21 DIAGNOSIS — D509 Iron deficiency anemia, unspecified: Secondary | ICD-10-CM | POA: Diagnosis not present

## 2017-02-21 DIAGNOSIS — N186 End stage renal disease: Secondary | ICD-10-CM | POA: Diagnosis not present

## 2017-02-21 DIAGNOSIS — E209 Hypoparathyroidism, unspecified: Secondary | ICD-10-CM | POA: Diagnosis not present

## 2017-02-21 DIAGNOSIS — D631 Anemia in chronic kidney disease: Secondary | ICD-10-CM | POA: Diagnosis not present

## 2017-02-21 DIAGNOSIS — N2581 Secondary hyperparathyroidism of renal origin: Secondary | ICD-10-CM | POA: Diagnosis not present

## 2017-02-24 DIAGNOSIS — E209 Hypoparathyroidism, unspecified: Secondary | ICD-10-CM | POA: Diagnosis not present

## 2017-02-24 DIAGNOSIS — D631 Anemia in chronic kidney disease: Secondary | ICD-10-CM | POA: Diagnosis not present

## 2017-02-24 DIAGNOSIS — N2581 Secondary hyperparathyroidism of renal origin: Secondary | ICD-10-CM | POA: Diagnosis not present

## 2017-02-24 DIAGNOSIS — D509 Iron deficiency anemia, unspecified: Secondary | ICD-10-CM | POA: Diagnosis not present

## 2017-02-24 DIAGNOSIS — N186 End stage renal disease: Secondary | ICD-10-CM | POA: Diagnosis not present

## 2017-02-26 DIAGNOSIS — N186 End stage renal disease: Secondary | ICD-10-CM | POA: Diagnosis not present

## 2017-02-26 DIAGNOSIS — D509 Iron deficiency anemia, unspecified: Secondary | ICD-10-CM | POA: Diagnosis not present

## 2017-02-26 DIAGNOSIS — E209 Hypoparathyroidism, unspecified: Secondary | ICD-10-CM | POA: Diagnosis not present

## 2017-02-26 DIAGNOSIS — N2581 Secondary hyperparathyroidism of renal origin: Secondary | ICD-10-CM | POA: Diagnosis not present

## 2017-02-26 DIAGNOSIS — D631 Anemia in chronic kidney disease: Secondary | ICD-10-CM | POA: Diagnosis not present

## 2017-02-28 DIAGNOSIS — D509 Iron deficiency anemia, unspecified: Secondary | ICD-10-CM | POA: Diagnosis not present

## 2017-02-28 DIAGNOSIS — D631 Anemia in chronic kidney disease: Secondary | ICD-10-CM | POA: Diagnosis not present

## 2017-02-28 DIAGNOSIS — E209 Hypoparathyroidism, unspecified: Secondary | ICD-10-CM | POA: Diagnosis not present

## 2017-02-28 DIAGNOSIS — N186 End stage renal disease: Secondary | ICD-10-CM | POA: Diagnosis not present

## 2017-02-28 DIAGNOSIS — N2581 Secondary hyperparathyroidism of renal origin: Secondary | ICD-10-CM | POA: Diagnosis not present

## 2017-03-03 DIAGNOSIS — D509 Iron deficiency anemia, unspecified: Secondary | ICD-10-CM | POA: Diagnosis not present

## 2017-03-03 DIAGNOSIS — N2581 Secondary hyperparathyroidism of renal origin: Secondary | ICD-10-CM | POA: Diagnosis not present

## 2017-03-03 DIAGNOSIS — D631 Anemia in chronic kidney disease: Secondary | ICD-10-CM | POA: Diagnosis not present

## 2017-03-03 DIAGNOSIS — E209 Hypoparathyroidism, unspecified: Secondary | ICD-10-CM | POA: Diagnosis not present

## 2017-03-03 DIAGNOSIS — N186 End stage renal disease: Secondary | ICD-10-CM | POA: Diagnosis not present

## 2017-03-05 DIAGNOSIS — E209 Hypoparathyroidism, unspecified: Secondary | ICD-10-CM | POA: Diagnosis not present

## 2017-03-05 DIAGNOSIS — D509 Iron deficiency anemia, unspecified: Secondary | ICD-10-CM | POA: Diagnosis not present

## 2017-03-05 DIAGNOSIS — N2581 Secondary hyperparathyroidism of renal origin: Secondary | ICD-10-CM | POA: Diagnosis not present

## 2017-03-05 DIAGNOSIS — N186 End stage renal disease: Secondary | ICD-10-CM | POA: Diagnosis not present

## 2017-03-05 DIAGNOSIS — D631 Anemia in chronic kidney disease: Secondary | ICD-10-CM | POA: Diagnosis not present

## 2017-03-07 DIAGNOSIS — N186 End stage renal disease: Secondary | ICD-10-CM | POA: Diagnosis not present

## 2017-03-07 DIAGNOSIS — E209 Hypoparathyroidism, unspecified: Secondary | ICD-10-CM | POA: Diagnosis not present

## 2017-03-07 DIAGNOSIS — D509 Iron deficiency anemia, unspecified: Secondary | ICD-10-CM | POA: Diagnosis not present

## 2017-03-07 DIAGNOSIS — N2581 Secondary hyperparathyroidism of renal origin: Secondary | ICD-10-CM | POA: Diagnosis not present

## 2017-03-07 DIAGNOSIS — D631 Anemia in chronic kidney disease: Secondary | ICD-10-CM | POA: Diagnosis not present

## 2017-03-10 DIAGNOSIS — N186 End stage renal disease: Secondary | ICD-10-CM | POA: Diagnosis not present

## 2017-03-10 DIAGNOSIS — D509 Iron deficiency anemia, unspecified: Secondary | ICD-10-CM | POA: Diagnosis not present

## 2017-03-10 DIAGNOSIS — E209 Hypoparathyroidism, unspecified: Secondary | ICD-10-CM | POA: Diagnosis not present

## 2017-03-10 DIAGNOSIS — N2581 Secondary hyperparathyroidism of renal origin: Secondary | ICD-10-CM | POA: Diagnosis not present

## 2017-03-10 DIAGNOSIS — D631 Anemia in chronic kidney disease: Secondary | ICD-10-CM | POA: Diagnosis not present

## 2017-03-12 DIAGNOSIS — E209 Hypoparathyroidism, unspecified: Secondary | ICD-10-CM | POA: Diagnosis not present

## 2017-03-12 DIAGNOSIS — N186 End stage renal disease: Secondary | ICD-10-CM | POA: Diagnosis not present

## 2017-03-12 DIAGNOSIS — D631 Anemia in chronic kidney disease: Secondary | ICD-10-CM | POA: Diagnosis not present

## 2017-03-12 DIAGNOSIS — N2581 Secondary hyperparathyroidism of renal origin: Secondary | ICD-10-CM | POA: Diagnosis not present

## 2017-03-12 DIAGNOSIS — D509 Iron deficiency anemia, unspecified: Secondary | ICD-10-CM | POA: Diagnosis not present

## 2017-03-13 DIAGNOSIS — G8929 Other chronic pain: Secondary | ICD-10-CM | POA: Diagnosis not present

## 2017-03-13 DIAGNOSIS — Z794 Long term (current) use of insulin: Secondary | ICD-10-CM | POA: Diagnosis not present

## 2017-03-13 DIAGNOSIS — M79605 Pain in left leg: Secondary | ICD-10-CM | POA: Diagnosis not present

## 2017-03-13 DIAGNOSIS — E08620 Diabetes mellitus due to underlying condition with diabetic dermatitis: Secondary | ICD-10-CM | POA: Diagnosis not present

## 2017-03-14 DIAGNOSIS — D631 Anemia in chronic kidney disease: Secondary | ICD-10-CM | POA: Diagnosis not present

## 2017-03-14 DIAGNOSIS — E1129 Type 2 diabetes mellitus with other diabetic kidney complication: Secondary | ICD-10-CM | POA: Diagnosis not present

## 2017-03-14 DIAGNOSIS — Z992 Dependence on renal dialysis: Secondary | ICD-10-CM | POA: Diagnosis not present

## 2017-03-14 DIAGNOSIS — D509 Iron deficiency anemia, unspecified: Secondary | ICD-10-CM | POA: Diagnosis not present

## 2017-03-14 DIAGNOSIS — E209 Hypoparathyroidism, unspecified: Secondary | ICD-10-CM | POA: Diagnosis not present

## 2017-03-14 DIAGNOSIS — N2581 Secondary hyperparathyroidism of renal origin: Secondary | ICD-10-CM | POA: Diagnosis not present

## 2017-03-14 DIAGNOSIS — N186 End stage renal disease: Secondary | ICD-10-CM | POA: Diagnosis not present

## 2017-03-17 DIAGNOSIS — D631 Anemia in chronic kidney disease: Secondary | ICD-10-CM | POA: Diagnosis not present

## 2017-03-17 DIAGNOSIS — N2581 Secondary hyperparathyroidism of renal origin: Secondary | ICD-10-CM | POA: Diagnosis not present

## 2017-03-17 DIAGNOSIS — E119 Type 2 diabetes mellitus without complications: Secondary | ICD-10-CM | POA: Diagnosis not present

## 2017-03-17 DIAGNOSIS — N186 End stage renal disease: Secondary | ICD-10-CM | POA: Diagnosis not present

## 2017-03-17 DIAGNOSIS — D509 Iron deficiency anemia, unspecified: Secondary | ICD-10-CM | POA: Diagnosis not present

## 2017-03-17 DIAGNOSIS — E209 Hypoparathyroidism, unspecified: Secondary | ICD-10-CM | POA: Diagnosis not present

## 2017-03-19 DIAGNOSIS — N186 End stage renal disease: Secondary | ICD-10-CM | POA: Diagnosis not present

## 2017-03-19 DIAGNOSIS — E119 Type 2 diabetes mellitus without complications: Secondary | ICD-10-CM | POA: Diagnosis not present

## 2017-03-19 DIAGNOSIS — D509 Iron deficiency anemia, unspecified: Secondary | ICD-10-CM | POA: Diagnosis not present

## 2017-03-19 DIAGNOSIS — E209 Hypoparathyroidism, unspecified: Secondary | ICD-10-CM | POA: Diagnosis not present

## 2017-03-19 DIAGNOSIS — N2581 Secondary hyperparathyroidism of renal origin: Secondary | ICD-10-CM | POA: Diagnosis not present

## 2017-03-19 DIAGNOSIS — D631 Anemia in chronic kidney disease: Secondary | ICD-10-CM | POA: Diagnosis not present

## 2017-03-21 DIAGNOSIS — D509 Iron deficiency anemia, unspecified: Secondary | ICD-10-CM | POA: Diagnosis not present

## 2017-03-21 DIAGNOSIS — N2581 Secondary hyperparathyroidism of renal origin: Secondary | ICD-10-CM | POA: Diagnosis not present

## 2017-03-21 DIAGNOSIS — E209 Hypoparathyroidism, unspecified: Secondary | ICD-10-CM | POA: Diagnosis not present

## 2017-03-21 DIAGNOSIS — N186 End stage renal disease: Secondary | ICD-10-CM | POA: Diagnosis not present

## 2017-03-21 DIAGNOSIS — E119 Type 2 diabetes mellitus without complications: Secondary | ICD-10-CM | POA: Diagnosis not present

## 2017-03-21 DIAGNOSIS — D631 Anemia in chronic kidney disease: Secondary | ICD-10-CM | POA: Diagnosis not present

## 2017-03-24 DIAGNOSIS — D509 Iron deficiency anemia, unspecified: Secondary | ICD-10-CM | POA: Diagnosis not present

## 2017-03-24 DIAGNOSIS — N186 End stage renal disease: Secondary | ICD-10-CM | POA: Diagnosis not present

## 2017-03-24 DIAGNOSIS — D631 Anemia in chronic kidney disease: Secondary | ICD-10-CM | POA: Diagnosis not present

## 2017-03-24 DIAGNOSIS — E119 Type 2 diabetes mellitus without complications: Secondary | ICD-10-CM | POA: Diagnosis not present

## 2017-03-24 DIAGNOSIS — E209 Hypoparathyroidism, unspecified: Secondary | ICD-10-CM | POA: Diagnosis not present

## 2017-03-24 DIAGNOSIS — N2581 Secondary hyperparathyroidism of renal origin: Secondary | ICD-10-CM | POA: Diagnosis not present

## 2017-03-26 DIAGNOSIS — D631 Anemia in chronic kidney disease: Secondary | ICD-10-CM | POA: Diagnosis not present

## 2017-03-26 DIAGNOSIS — N2581 Secondary hyperparathyroidism of renal origin: Secondary | ICD-10-CM | POA: Diagnosis not present

## 2017-03-26 DIAGNOSIS — E209 Hypoparathyroidism, unspecified: Secondary | ICD-10-CM | POA: Diagnosis not present

## 2017-03-26 DIAGNOSIS — E119 Type 2 diabetes mellitus without complications: Secondary | ICD-10-CM | POA: Diagnosis not present

## 2017-03-26 DIAGNOSIS — N186 End stage renal disease: Secondary | ICD-10-CM | POA: Diagnosis not present

## 2017-03-26 DIAGNOSIS — D509 Iron deficiency anemia, unspecified: Secondary | ICD-10-CM | POA: Diagnosis not present

## 2017-03-28 DIAGNOSIS — E209 Hypoparathyroidism, unspecified: Secondary | ICD-10-CM | POA: Diagnosis not present

## 2017-03-28 DIAGNOSIS — D631 Anemia in chronic kidney disease: Secondary | ICD-10-CM | POA: Diagnosis not present

## 2017-03-28 DIAGNOSIS — E119 Type 2 diabetes mellitus without complications: Secondary | ICD-10-CM | POA: Diagnosis not present

## 2017-03-28 DIAGNOSIS — N186 End stage renal disease: Secondary | ICD-10-CM | POA: Diagnosis not present

## 2017-03-28 DIAGNOSIS — N2581 Secondary hyperparathyroidism of renal origin: Secondary | ICD-10-CM | POA: Diagnosis not present

## 2017-03-28 DIAGNOSIS — D509 Iron deficiency anemia, unspecified: Secondary | ICD-10-CM | POA: Diagnosis not present

## 2017-03-31 DIAGNOSIS — N2581 Secondary hyperparathyroidism of renal origin: Secondary | ICD-10-CM | POA: Diagnosis not present

## 2017-03-31 DIAGNOSIS — E119 Type 2 diabetes mellitus without complications: Secondary | ICD-10-CM | POA: Diagnosis not present

## 2017-03-31 DIAGNOSIS — D631 Anemia in chronic kidney disease: Secondary | ICD-10-CM | POA: Diagnosis not present

## 2017-03-31 DIAGNOSIS — D509 Iron deficiency anemia, unspecified: Secondary | ICD-10-CM | POA: Diagnosis not present

## 2017-03-31 DIAGNOSIS — N186 End stage renal disease: Secondary | ICD-10-CM | POA: Diagnosis not present

## 2017-03-31 DIAGNOSIS — E209 Hypoparathyroidism, unspecified: Secondary | ICD-10-CM | POA: Diagnosis not present

## 2017-04-02 DIAGNOSIS — D509 Iron deficiency anemia, unspecified: Secondary | ICD-10-CM | POA: Diagnosis not present

## 2017-04-02 DIAGNOSIS — N186 End stage renal disease: Secondary | ICD-10-CM | POA: Diagnosis not present

## 2017-04-02 DIAGNOSIS — E209 Hypoparathyroidism, unspecified: Secondary | ICD-10-CM | POA: Diagnosis not present

## 2017-04-02 DIAGNOSIS — D631 Anemia in chronic kidney disease: Secondary | ICD-10-CM | POA: Diagnosis not present

## 2017-04-02 DIAGNOSIS — E119 Type 2 diabetes mellitus without complications: Secondary | ICD-10-CM | POA: Diagnosis not present

## 2017-04-02 DIAGNOSIS — N2581 Secondary hyperparathyroidism of renal origin: Secondary | ICD-10-CM | POA: Diagnosis not present

## 2017-04-04 DIAGNOSIS — E209 Hypoparathyroidism, unspecified: Secondary | ICD-10-CM | POA: Diagnosis not present

## 2017-04-04 DIAGNOSIS — N186 End stage renal disease: Secondary | ICD-10-CM | POA: Diagnosis not present

## 2017-04-04 DIAGNOSIS — N2581 Secondary hyperparathyroidism of renal origin: Secondary | ICD-10-CM | POA: Diagnosis not present

## 2017-04-04 DIAGNOSIS — D509 Iron deficiency anemia, unspecified: Secondary | ICD-10-CM | POA: Diagnosis not present

## 2017-04-04 DIAGNOSIS — E119 Type 2 diabetes mellitus without complications: Secondary | ICD-10-CM | POA: Diagnosis not present

## 2017-04-04 DIAGNOSIS — D631 Anemia in chronic kidney disease: Secondary | ICD-10-CM | POA: Diagnosis not present

## 2017-04-05 DIAGNOSIS — D631 Anemia in chronic kidney disease: Secondary | ICD-10-CM | POA: Diagnosis not present

## 2017-04-05 DIAGNOSIS — E119 Type 2 diabetes mellitus without complications: Secondary | ICD-10-CM | POA: Diagnosis not present

## 2017-04-05 DIAGNOSIS — E209 Hypoparathyroidism, unspecified: Secondary | ICD-10-CM | POA: Diagnosis not present

## 2017-04-05 DIAGNOSIS — N186 End stage renal disease: Secondary | ICD-10-CM | POA: Diagnosis not present

## 2017-04-05 DIAGNOSIS — D509 Iron deficiency anemia, unspecified: Secondary | ICD-10-CM | POA: Diagnosis not present

## 2017-04-05 DIAGNOSIS — N2581 Secondary hyperparathyroidism of renal origin: Secondary | ICD-10-CM | POA: Diagnosis not present

## 2017-04-07 DIAGNOSIS — D631 Anemia in chronic kidney disease: Secondary | ICD-10-CM | POA: Diagnosis not present

## 2017-04-07 DIAGNOSIS — E209 Hypoparathyroidism, unspecified: Secondary | ICD-10-CM | POA: Diagnosis not present

## 2017-04-07 DIAGNOSIS — E119 Type 2 diabetes mellitus without complications: Secondary | ICD-10-CM | POA: Diagnosis not present

## 2017-04-07 DIAGNOSIS — N186 End stage renal disease: Secondary | ICD-10-CM | POA: Diagnosis not present

## 2017-04-07 DIAGNOSIS — D509 Iron deficiency anemia, unspecified: Secondary | ICD-10-CM | POA: Diagnosis not present

## 2017-04-07 DIAGNOSIS — N2581 Secondary hyperparathyroidism of renal origin: Secondary | ICD-10-CM | POA: Diagnosis not present

## 2017-04-09 DIAGNOSIS — N2581 Secondary hyperparathyroidism of renal origin: Secondary | ICD-10-CM | POA: Diagnosis not present

## 2017-04-09 DIAGNOSIS — D631 Anemia in chronic kidney disease: Secondary | ICD-10-CM | POA: Diagnosis not present

## 2017-04-09 DIAGNOSIS — N186 End stage renal disease: Secondary | ICD-10-CM | POA: Diagnosis not present

## 2017-04-09 DIAGNOSIS — E209 Hypoparathyroidism, unspecified: Secondary | ICD-10-CM | POA: Diagnosis not present

## 2017-04-09 DIAGNOSIS — E119 Type 2 diabetes mellitus without complications: Secondary | ICD-10-CM | POA: Diagnosis not present

## 2017-04-09 DIAGNOSIS — D509 Iron deficiency anemia, unspecified: Secondary | ICD-10-CM | POA: Diagnosis not present

## 2017-04-11 DIAGNOSIS — N2581 Secondary hyperparathyroidism of renal origin: Secondary | ICD-10-CM | POA: Diagnosis not present

## 2017-04-11 DIAGNOSIS — N186 End stage renal disease: Secondary | ICD-10-CM | POA: Diagnosis not present

## 2017-04-11 DIAGNOSIS — E209 Hypoparathyroidism, unspecified: Secondary | ICD-10-CM | POA: Diagnosis not present

## 2017-04-11 DIAGNOSIS — D509 Iron deficiency anemia, unspecified: Secondary | ICD-10-CM | POA: Diagnosis not present

## 2017-04-11 DIAGNOSIS — E119 Type 2 diabetes mellitus without complications: Secondary | ICD-10-CM | POA: Diagnosis not present

## 2017-04-11 DIAGNOSIS — D631 Anemia in chronic kidney disease: Secondary | ICD-10-CM | POA: Diagnosis not present

## 2017-04-13 DIAGNOSIS — N186 End stage renal disease: Secondary | ICD-10-CM | POA: Diagnosis not present

## 2017-04-13 DIAGNOSIS — Z992 Dependence on renal dialysis: Secondary | ICD-10-CM | POA: Diagnosis not present

## 2017-04-13 DIAGNOSIS — E1129 Type 2 diabetes mellitus with other diabetic kidney complication: Secondary | ICD-10-CM | POA: Diagnosis not present

## 2017-04-14 DIAGNOSIS — N186 End stage renal disease: Secondary | ICD-10-CM | POA: Diagnosis not present

## 2017-04-14 DIAGNOSIS — E119 Type 2 diabetes mellitus without complications: Secondary | ICD-10-CM | POA: Diagnosis not present

## 2017-04-14 DIAGNOSIS — E209 Hypoparathyroidism, unspecified: Secondary | ICD-10-CM | POA: Diagnosis not present

## 2017-04-14 DIAGNOSIS — N2581 Secondary hyperparathyroidism of renal origin: Secondary | ICD-10-CM | POA: Diagnosis not present

## 2017-04-14 DIAGNOSIS — D631 Anemia in chronic kidney disease: Secondary | ICD-10-CM | POA: Diagnosis not present

## 2017-04-14 DIAGNOSIS — D509 Iron deficiency anemia, unspecified: Secondary | ICD-10-CM | POA: Diagnosis not present

## 2017-04-15 DIAGNOSIS — E08620 Diabetes mellitus due to underlying condition with diabetic dermatitis: Secondary | ICD-10-CM | POA: Diagnosis not present

## 2017-04-15 DIAGNOSIS — Z79899 Other long term (current) drug therapy: Secondary | ICD-10-CM | POA: Diagnosis not present

## 2017-04-15 DIAGNOSIS — G8929 Other chronic pain: Secondary | ICD-10-CM | POA: Diagnosis not present

## 2017-04-15 DIAGNOSIS — M79605 Pain in left leg: Secondary | ICD-10-CM | POA: Diagnosis not present

## 2017-04-15 DIAGNOSIS — Z794 Long term (current) use of insulin: Secondary | ICD-10-CM | POA: Diagnosis not present

## 2017-04-16 DIAGNOSIS — N186 End stage renal disease: Secondary | ICD-10-CM | POA: Diagnosis not present

## 2017-04-16 DIAGNOSIS — D509 Iron deficiency anemia, unspecified: Secondary | ICD-10-CM | POA: Diagnosis not present

## 2017-04-16 DIAGNOSIS — E209 Hypoparathyroidism, unspecified: Secondary | ICD-10-CM | POA: Diagnosis not present

## 2017-04-16 DIAGNOSIS — E119 Type 2 diabetes mellitus without complications: Secondary | ICD-10-CM | POA: Diagnosis not present

## 2017-04-16 DIAGNOSIS — D631 Anemia in chronic kidney disease: Secondary | ICD-10-CM | POA: Diagnosis not present

## 2017-04-16 DIAGNOSIS — N2581 Secondary hyperparathyroidism of renal origin: Secondary | ICD-10-CM | POA: Diagnosis not present

## 2017-04-18 DIAGNOSIS — N2581 Secondary hyperparathyroidism of renal origin: Secondary | ICD-10-CM | POA: Diagnosis not present

## 2017-04-18 DIAGNOSIS — D631 Anemia in chronic kidney disease: Secondary | ICD-10-CM | POA: Diagnosis not present

## 2017-04-18 DIAGNOSIS — E119 Type 2 diabetes mellitus without complications: Secondary | ICD-10-CM | POA: Diagnosis not present

## 2017-04-18 DIAGNOSIS — D509 Iron deficiency anemia, unspecified: Secondary | ICD-10-CM | POA: Diagnosis not present

## 2017-04-18 DIAGNOSIS — E209 Hypoparathyroidism, unspecified: Secondary | ICD-10-CM | POA: Diagnosis not present

## 2017-04-18 DIAGNOSIS — N186 End stage renal disease: Secondary | ICD-10-CM | POA: Diagnosis not present

## 2017-04-21 DIAGNOSIS — D631 Anemia in chronic kidney disease: Secondary | ICD-10-CM | POA: Diagnosis not present

## 2017-04-21 DIAGNOSIS — E119 Type 2 diabetes mellitus without complications: Secondary | ICD-10-CM | POA: Diagnosis not present

## 2017-04-21 DIAGNOSIS — N186 End stage renal disease: Secondary | ICD-10-CM | POA: Diagnosis not present

## 2017-04-21 DIAGNOSIS — D509 Iron deficiency anemia, unspecified: Secondary | ICD-10-CM | POA: Diagnosis not present

## 2017-04-21 DIAGNOSIS — E209 Hypoparathyroidism, unspecified: Secondary | ICD-10-CM | POA: Diagnosis not present

## 2017-04-21 DIAGNOSIS — N2581 Secondary hyperparathyroidism of renal origin: Secondary | ICD-10-CM | POA: Diagnosis not present

## 2017-04-23 DIAGNOSIS — N2581 Secondary hyperparathyroidism of renal origin: Secondary | ICD-10-CM | POA: Diagnosis not present

## 2017-04-23 DIAGNOSIS — E209 Hypoparathyroidism, unspecified: Secondary | ICD-10-CM | POA: Diagnosis not present

## 2017-04-23 DIAGNOSIS — D631 Anemia in chronic kidney disease: Secondary | ICD-10-CM | POA: Diagnosis not present

## 2017-04-23 DIAGNOSIS — N186 End stage renal disease: Secondary | ICD-10-CM | POA: Diagnosis not present

## 2017-04-23 DIAGNOSIS — D509 Iron deficiency anemia, unspecified: Secondary | ICD-10-CM | POA: Diagnosis not present

## 2017-04-23 DIAGNOSIS — E119 Type 2 diabetes mellitus without complications: Secondary | ICD-10-CM | POA: Diagnosis not present

## 2017-04-25 DIAGNOSIS — E209 Hypoparathyroidism, unspecified: Secondary | ICD-10-CM | POA: Diagnosis not present

## 2017-04-25 DIAGNOSIS — D631 Anemia in chronic kidney disease: Secondary | ICD-10-CM | POA: Diagnosis not present

## 2017-04-25 DIAGNOSIS — D509 Iron deficiency anemia, unspecified: Secondary | ICD-10-CM | POA: Diagnosis not present

## 2017-04-25 DIAGNOSIS — E119 Type 2 diabetes mellitus without complications: Secondary | ICD-10-CM | POA: Diagnosis not present

## 2017-04-25 DIAGNOSIS — N2581 Secondary hyperparathyroidism of renal origin: Secondary | ICD-10-CM | POA: Diagnosis not present

## 2017-04-25 DIAGNOSIS — N186 End stage renal disease: Secondary | ICD-10-CM | POA: Diagnosis not present

## 2017-04-28 DIAGNOSIS — E119 Type 2 diabetes mellitus without complications: Secondary | ICD-10-CM | POA: Diagnosis not present

## 2017-04-28 DIAGNOSIS — D509 Iron deficiency anemia, unspecified: Secondary | ICD-10-CM | POA: Diagnosis not present

## 2017-04-28 DIAGNOSIS — E209 Hypoparathyroidism, unspecified: Secondary | ICD-10-CM | POA: Diagnosis not present

## 2017-04-28 DIAGNOSIS — N2581 Secondary hyperparathyroidism of renal origin: Secondary | ICD-10-CM | POA: Diagnosis not present

## 2017-04-28 DIAGNOSIS — D631 Anemia in chronic kidney disease: Secondary | ICD-10-CM | POA: Diagnosis not present

## 2017-04-28 DIAGNOSIS — N186 End stage renal disease: Secondary | ICD-10-CM | POA: Diagnosis not present

## 2017-04-30 DIAGNOSIS — E119 Type 2 diabetes mellitus without complications: Secondary | ICD-10-CM | POA: Diagnosis not present

## 2017-04-30 DIAGNOSIS — D631 Anemia in chronic kidney disease: Secondary | ICD-10-CM | POA: Diagnosis not present

## 2017-04-30 DIAGNOSIS — N2581 Secondary hyperparathyroidism of renal origin: Secondary | ICD-10-CM | POA: Diagnosis not present

## 2017-04-30 DIAGNOSIS — D509 Iron deficiency anemia, unspecified: Secondary | ICD-10-CM | POA: Diagnosis not present

## 2017-04-30 DIAGNOSIS — N186 End stage renal disease: Secondary | ICD-10-CM | POA: Diagnosis not present

## 2017-04-30 DIAGNOSIS — E209 Hypoparathyroidism, unspecified: Secondary | ICD-10-CM | POA: Diagnosis not present

## 2017-05-01 ENCOUNTER — Telehealth (INDEPENDENT_AMBULATORY_CARE_PROVIDER_SITE_OTHER): Payer: Self-pay | Admitting: Family

## 2017-05-01 ENCOUNTER — Encounter (INDEPENDENT_AMBULATORY_CARE_PROVIDER_SITE_OTHER): Payer: Self-pay | Admitting: Family

## 2017-05-01 ENCOUNTER — Ambulatory Visit (INDEPENDENT_AMBULATORY_CARE_PROVIDER_SITE_OTHER): Payer: Medicare Other | Admitting: Family

## 2017-05-01 DIAGNOSIS — Z89512 Acquired absence of left leg below knee: Secondary | ICD-10-CM | POA: Diagnosis not present

## 2017-05-01 NOTE — Progress Notes (Addendum)
Office Visit Note   Patient: [name]           Date of Birth: [birth year]           MRN: [id number] Visit Date: 05/01/2017              Requested by: Delrae Rend, MD [address] [place], [place] [address] PCP: Sandi Mariscal, MD  Chief Complaint  Patient presents with  . Left Leg - Follow-up      HPI: The patient is a 51 year old gentleman who presents today for evaluation of his left residual limb. 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 Has been working with [employer] for his prosthetic needs. He's had residual limb volume loss his socket liner are ill fitting.  Also complains that his double upright brace on the right is too heavy. This is hindering his weight bearing. Has seen his prosthetist/orthotist who has recommended a Donjoy ankle velocity brace. He has been in the DUB due to severe medial ankle collapse for several years. Is also in need of new custom orthotics and extra depth shoes. His current shoe wear is worn out.   Assessment & Plan: Visit Diagnoses:  1. History of left below knee amputation ([place])     Plan: have provided an order for new liners as well as the new socket for his left below the knee amputation to [employer]. Order for Donjoy ankle brace, new orthotics and ED shoes faxed as well. Follow up in office as needed.  Follow-Up Instructions: Return if symptoms worsen or fail to improve.   Ortho Exam  Patient is alert, oriented, no adenopathy, well-dressed, normal affect, normal respiratory effort. On examination of left residual limb. No open ulcers no callus. No erythema. Limb is well consolidated. Right foot and ankle without swelling, callus, erythema or impending ulceration.   Liner and socket are visibly loose on exam. Does not have any extra ply to wear.   Imaging: No results found. No images are attached to the encounter.  Labs: Lab Results  Component Value Date   HGBA1C (H) 07/30/2007    7.1 (NOTE)   The  ADA recommends the following therapeutic goals for glycemic   control related to Hgb A1C measurement:   Goal of Therapy:   < 7.0% Hgb A1C   Action Suggested:  > 8.0% Hgb A1C   Ref:  Diabetes Care, 22, Suppl. 1, 1999   HGBA1C (H) 02/12/2007    7.3 (NOTE)   The ADA recommends the following therapeutic goals for glycemic   control related to Hgb A1C measurement:   Goal of Therapy:   < 7.0% Hgb A1C   Action Suggested:  > 8.0% Hgb A1C   Ref:  Diabetes Care, 22, Suppl. 1, 1999   ESRSEDRATE 50 (H) 05/25/2013   REPTSTATUS 08/21/2015 FINAL 08/16/2015   GRAMSTAIN  08/14/2015    FEW WBC PRESENT,BOTH PMN AND MONONUCLEAR NO SQUAMOUS EPITHELIAL CELLS SEEN NO ORGANISMS SEEN Performed at [facility]  08/14/2015    FEW WBC PRESENT,BOTH PMN AND MONONUCLEAR NO SQUAMOUS EPITHELIAL CELLS SEEN NO ORGANISMS SEEN Performed at [facility] NO GROWTH 5 DAYS 08/16/2015   LABORGA METHICILLIN RESISTANT STAPHYLOCOCCUS AUREUS 08/14/2015    Orders:  No orders of the defined types were placed in this encounter.  No orders of the defined types were placed in this encounter.    Procedures: No procedures performed  Clinical Data: No  additional findings.  ROS:  All other systems negative, except as noted in the HPI. Review of Systems  Constitutional: Negative for chills and fever.  Cardiovascular: Negative for leg swelling.  Skin: Negative for color change and wound.    Objective: Vital Signs: There were no vitals taken for this visit.  Specialty Comments:  No specialty comments available.  PMFS History: Patient Active Problem List   Diagnosis Date Noted  . Postop check 12/05/2016  . Status post ankle fusion 05/23/2016  . Acute chest pain 09/12/2015  . Chest pain 09/12/2015  . Septic shock ([place])   . MRSA bacteremia   . Esophageal reflux   . Infection and inflammatory reaction due to cardiac device, implant, and graft ([place])   . Diabetes mellitus type 2,  uncontrolled ([place]) 04/18/2015  . Status post below knee amputation of left lower extremity ([place]) 04/17/2015  . 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 ESRD on dialysis ([place]) 10/23/2012  . DIASTOLIC DYSFUNCTION 72/03/4708  . PVD 10/22/2007  . ESRD 10/22/2007  . HYPERPARATHYROIDISM, HX OF 10/22/2007   Past Medical History:  Diagnosis Date  . Amputee, below knee, left ([place])   . Anemia   . Blood transfusion   . Bronchitis   . Chest pain   . CHF (congestive heart failure) ([place])   . Chronic kidney disease    d[facility], [facility]  . Diabetes mellitus    type 2  . Dysrhythmia    irregular heartbeat  . ESRD (end stage renal disease) on dialysis ([place])    first HD 06/19/04  . GERD (gastroesophageal reflux disease)   . Hyperparathyroidism ([place])   . Morbid obesity ([place])     Family History  Problem Relation Age of Onset  . Heart disease Mother   . Kidney disease Mother   . Hypertension Mother     Past Surgical History:  Procedure Laterality Date  . AMPUTATION Left 01/20/2015   Procedure: Left Great Toe Amputation;  Surgeon: Newt Minion, MD;  Location: [facility];  Service: Orthopedics;  Laterality: Left;  . AMPUTATION Left 03/07/2015   Procedure: Left Foot 1st and 2nd Ray Amputation;  Surgeon: Newt Minion, MD;  Location: [facility];  Service: Orthopedics;  Laterality: Left;  . AMPUTATION Left 04/14/2015   Procedure: LEFT BELOW THE KNEE AMPUTATION ;  Surgeon: Newt Minion, MD;  Location: [facility];  Service: Orthopedics;  Laterality: Left;  . ANKLE FUSION     rt foot charcot joint 2008 with refusion  . AV FISTULA PLACEMENT  10/11/2010  . AV FISTULA PLACEMENT  07/02/2011   Procedure: INSERTION OF ARTERIOVENOUS (AV) GORE-TEX GRAFT ARM;  Surgeon: Elam Dutch, MD;  Location: [facility];  Service: Vascular;  Laterality: Left;  . AV FISTULA PLACEMENT Right 11/06/2012   Procedure: INSERTION OF ARTERIOVENOUS (AV) GORE-TEX GRAFT ARM;  Surgeon: Mal Misty, MD;  Location: [facility];  Service: Vascular;  Laterality: Right;  . AV FISTULA PLACEMENT Right 03/21/2016   Procedure: INSERTION OF ARTERIOVENOUS (AV) GORE-TEX GRAFT ARM ( LOOP FOREARM );  Surgeon: Algernon Huxley, MD;  Location: [facility];  Service: Vascular;  Laterality: Right;  . AV FISTULA PLACEMENT Left 11/07/2016   Procedure: INSERTION OF ARTERIOVENOUS (AV) Artegraft;  Surgeon: Algernon Huxley, MD;  Location: [facility];  Service: Vascular;  Laterality: Left;  . [place] REMOVAL Right 11/06/2012   Procedure: REMOVAL OF ARTERIOVENOUS GORETEX GRAFT ([place]);  Surgeon: Mal Misty, MD;  [facility];  Service: Vascular;  Laterality: Right;  . ENDARTERECTOMY FEMORAL  11/07/2016   Procedure: ENDARTERECTOMY FEMORAL;  Surgeon: Algernon Huxley, MD;  Location: [facility];  Service: Vascular;;  . HERNIA REPAIR     Umbilical Hernia  . INSERTION OF DIALYSIS CATHETER  01/20/2012   Procedure: INSERTION OF DIALYSIS CATHETER;  Surgeon: Serafina Mitchell, MD;  Location: [facility];  Service: Vascular;  Laterality: N/A;  insertion left internal jugular dialysis catheter 23cm  . INSERTION OF DIALYSIS CATHETER Right 11/09/2012   Procedure: INSERTION OF DIALYSIS CATHETER;  Surgeon: Conrad Weston, MD;  Location: [facility];  Service: Vascular;  Laterality: Right;  Right  Femoral Vein  . INSERTION OF DIALYSIS CATHETER N/A 08/18/2015   Procedure: INSERTION OF DIALYSIS CATHETER RIGHT FEMORAL VEIN;  Surgeon: Mal Misty, MD;  Location: [facility];  Service: Cardiovascular;  Laterality: N/A;  . PARATHYROIDECTOMY    . PERIPHERAL VASCULAR CATHETERIZATION Right 04/04/2016   Procedure: A/V Shuntogram/Fistulagram;  Surgeon: Algernon Huxley, MD;  Location: [facility];  Service: Cardiovascular;  Laterality: Right;  . REMOVAL OF A HERO DEVICE Left 08/14/2015   Procedure: REMOVAL OF LEFT ARM HERO DEVICE;  Surgeon: Rosetta Posner, MD;  Location: [place] [facility];  Service: Vascular;  Laterality: Left;  . TEE WITHOUT CARDIOVERSION N/A 08/18/2015   Procedure:  TRANSESOPHAGEAL ECHOCARDIOGRAM (TEE);  Surgeon: Mal Misty, MD;  Location: [facility];  Service: Cardiovascular;  Laterality: N/A;  . TEE WITHOUT CARDIOVERSION N/A 12/21/2015   Procedure: TRANSESOPHAGEAL ECHOCARDIOGRAM (TEE);  Surgeon: Dixie Dials, MD;  Location: [facility];  Service: Cardiovascular;  Laterality: N/A;  . THROMBECTOMY    . Umbilcial Hernia    . UPPER EXTREMITY VENOGRAPHY Left 10/31/2016   Procedure: Upper Extremity Venography;  Surgeon: Algernon Huxley, MD;  Location: [facility];  Service: Cardiovascular;  Laterality: Left;  Marland Kitchen VENOGRAM N/A 01/06/2014   Procedure: VENOGRAM;  Surgeon: Conrad [place], MD;  Location: [facility];  Service: Cardiovascular;  Laterality: N/A;   Social History   Occupational History  . Not on file.   Social History Main Topics  . Smoking status: Former Smoker    Years: 20.00    Types: Cigarettes    Quit date: 01/13/2015  . Smokeless tobacco: Never Used     Comment: quit 2 months ago  . Alcohol use No  . Drug use: No  . Sexual activity: Not on file

## 2017-05-01 NOTE — Telephone Encounter (Signed)
Patient called advised he need a lighter brace for his right foot. Patient asked if the Rx could be faxed to [employer]. The number to contact is [phone]

## 2017-05-02 DIAGNOSIS — E119 Type 2 diabetes mellitus without complications: Secondary | ICD-10-CM | POA: Diagnosis not present

## 2017-05-02 DIAGNOSIS — E209 Hypoparathyroidism, unspecified: Secondary | ICD-10-CM | POA: Diagnosis not present

## 2017-05-02 DIAGNOSIS — N186 End stage renal disease: Secondary | ICD-10-CM | POA: Diagnosis not present

## 2017-05-02 DIAGNOSIS — N2581 Secondary hyperparathyroidism of renal origin: Secondary | ICD-10-CM | POA: Diagnosis not present

## 2017-05-02 DIAGNOSIS — D509 Iron deficiency anemia, unspecified: Secondary | ICD-10-CM | POA: Diagnosis not present

## 2017-05-02 DIAGNOSIS — D631 Anemia in chronic kidney disease: Secondary | ICD-10-CM | POA: Diagnosis not present

## 2017-05-05 DIAGNOSIS — D509 Iron deficiency anemia, unspecified: Secondary | ICD-10-CM | POA: Diagnosis not present

## 2017-05-05 DIAGNOSIS — N2581 Secondary hyperparathyroidism of renal origin: Secondary | ICD-10-CM | POA: Diagnosis not present

## 2017-05-05 DIAGNOSIS — E119 Type 2 diabetes mellitus without complications: Secondary | ICD-10-CM | POA: Diagnosis not present

## 2017-05-05 DIAGNOSIS — E209 Hypoparathyroidism, unspecified: Secondary | ICD-10-CM | POA: Diagnosis not present

## 2017-05-05 DIAGNOSIS — N186 End stage renal disease: Secondary | ICD-10-CM | POA: Diagnosis not present

## 2017-05-05 DIAGNOSIS — D631 Anemia in chronic kidney disease: Secondary | ICD-10-CM | POA: Diagnosis not present

## 2017-05-07 DIAGNOSIS — E209 Hypoparathyroidism, unspecified: Secondary | ICD-10-CM | POA: Diagnosis not present

## 2017-05-07 DIAGNOSIS — N186 End stage renal disease: Secondary | ICD-10-CM | POA: Diagnosis not present

## 2017-05-07 DIAGNOSIS — N2581 Secondary hyperparathyroidism of renal origin: Secondary | ICD-10-CM | POA: Diagnosis not present

## 2017-05-07 DIAGNOSIS — D509 Iron deficiency anemia, unspecified: Secondary | ICD-10-CM | POA: Diagnosis not present

## 2017-05-07 DIAGNOSIS — E119 Type 2 diabetes mellitus without complications: Secondary | ICD-10-CM | POA: Diagnosis not present

## 2017-05-07 DIAGNOSIS — E1129 Type 2 diabetes mellitus with other diabetic kidney complication: Secondary | ICD-10-CM | POA: Diagnosis not present

## 2017-05-07 DIAGNOSIS — D631 Anemia in chronic kidney disease: Secondary | ICD-10-CM | POA: Diagnosis not present

## 2017-05-09 DIAGNOSIS — N2581 Secondary hyperparathyroidism of renal origin: Secondary | ICD-10-CM | POA: Diagnosis not present

## 2017-05-09 DIAGNOSIS — E119 Type 2 diabetes mellitus without complications: Secondary | ICD-10-CM | POA: Diagnosis not present

## 2017-05-09 DIAGNOSIS — D509 Iron deficiency anemia, unspecified: Secondary | ICD-10-CM | POA: Diagnosis not present

## 2017-05-09 DIAGNOSIS — N186 End stage renal disease: Secondary | ICD-10-CM | POA: Diagnosis not present

## 2017-05-09 DIAGNOSIS — E209 Hypoparathyroidism, unspecified: Secondary | ICD-10-CM | POA: Diagnosis not present

## 2017-05-09 DIAGNOSIS — D631 Anemia in chronic kidney disease: Secondary | ICD-10-CM | POA: Diagnosis not present

## 2017-05-12 DIAGNOSIS — E209 Hypoparathyroidism, unspecified: Secondary | ICD-10-CM | POA: Diagnosis not present

## 2017-05-12 DIAGNOSIS — D509 Iron deficiency anemia, unspecified: Secondary | ICD-10-CM | POA: Diagnosis not present

## 2017-05-12 DIAGNOSIS — N186 End stage renal disease: Secondary | ICD-10-CM | POA: Diagnosis not present

## 2017-05-12 DIAGNOSIS — D631 Anemia in chronic kidney disease: Secondary | ICD-10-CM | POA: Diagnosis not present

## 2017-05-12 DIAGNOSIS — E119 Type 2 diabetes mellitus without complications: Secondary | ICD-10-CM | POA: Diagnosis not present

## 2017-05-12 DIAGNOSIS — N2581 Secondary hyperparathyroidism of renal origin: Secondary | ICD-10-CM | POA: Diagnosis not present

## 2017-05-14 DIAGNOSIS — N186 End stage renal disease: Secondary | ICD-10-CM | POA: Diagnosis not present

## 2017-05-14 DIAGNOSIS — E1129 Type 2 diabetes mellitus with other diabetic kidney complication: Secondary | ICD-10-CM | POA: Diagnosis not present

## 2017-05-14 DIAGNOSIS — N2581 Secondary hyperparathyroidism of renal origin: Secondary | ICD-10-CM | POA: Diagnosis not present

## 2017-05-14 DIAGNOSIS — D509 Iron deficiency anemia, unspecified: Secondary | ICD-10-CM | POA: Diagnosis not present

## 2017-05-14 DIAGNOSIS — E209 Hypoparathyroidism, unspecified: Secondary | ICD-10-CM | POA: Diagnosis not present

## 2017-05-14 DIAGNOSIS — D631 Anemia in chronic kidney disease: Secondary | ICD-10-CM | POA: Diagnosis not present

## 2017-05-14 DIAGNOSIS — E119 Type 2 diabetes mellitus without complications: Secondary | ICD-10-CM | POA: Diagnosis not present

## 2017-05-14 DIAGNOSIS — Z992 Dependence on renal dialysis: Secondary | ICD-10-CM | POA: Diagnosis not present

## 2017-05-15 DIAGNOSIS — M79605 Pain in left leg: Secondary | ICD-10-CM | POA: Diagnosis not present

## 2017-05-15 DIAGNOSIS — Z79899 Other long term (current) drug therapy: Secondary | ICD-10-CM | POA: Diagnosis not present

## 2017-05-15 DIAGNOSIS — E08620 Diabetes mellitus due to underlying condition with diabetic dermatitis: Secondary | ICD-10-CM | POA: Diagnosis not present

## 2017-05-15 DIAGNOSIS — Z794 Long term (current) use of insulin: Secondary | ICD-10-CM | POA: Diagnosis not present

## 2017-05-15 DIAGNOSIS — G8929 Other chronic pain: Secondary | ICD-10-CM | POA: Diagnosis not present

## 2017-05-16 DIAGNOSIS — N186 End stage renal disease: Secondary | ICD-10-CM | POA: Diagnosis not present

## 2017-05-16 DIAGNOSIS — N2581 Secondary hyperparathyroidism of renal origin: Secondary | ICD-10-CM | POA: Diagnosis not present

## 2017-05-16 DIAGNOSIS — D631 Anemia in chronic kidney disease: Secondary | ICD-10-CM | POA: Diagnosis not present

## 2017-05-16 DIAGNOSIS — E119 Type 2 diabetes mellitus without complications: Secondary | ICD-10-CM | POA: Diagnosis not present

## 2017-05-16 DIAGNOSIS — D509 Iron deficiency anemia, unspecified: Secondary | ICD-10-CM | POA: Diagnosis not present

## 2017-05-16 DIAGNOSIS — E209 Hypoparathyroidism, unspecified: Secondary | ICD-10-CM | POA: Diagnosis not present

## 2017-05-19 DIAGNOSIS — E209 Hypoparathyroidism, unspecified: Secondary | ICD-10-CM | POA: Diagnosis not present

## 2017-05-19 DIAGNOSIS — D631 Anemia in chronic kidney disease: Secondary | ICD-10-CM | POA: Diagnosis not present

## 2017-05-19 DIAGNOSIS — N186 End stage renal disease: Secondary | ICD-10-CM | POA: Diagnosis not present

## 2017-05-19 DIAGNOSIS — E119 Type 2 diabetes mellitus without complications: Secondary | ICD-10-CM | POA: Diagnosis not present

## 2017-05-19 DIAGNOSIS — D509 Iron deficiency anemia, unspecified: Secondary | ICD-10-CM | POA: Diagnosis not present

## 2017-05-19 DIAGNOSIS — N2581 Secondary hyperparathyroidism of renal origin: Secondary | ICD-10-CM | POA: Diagnosis not present

## 2017-05-21 DIAGNOSIS — N186 End stage renal disease: Secondary | ICD-10-CM | POA: Diagnosis not present

## 2017-05-21 DIAGNOSIS — D631 Anemia in chronic kidney disease: Secondary | ICD-10-CM | POA: Diagnosis not present

## 2017-05-21 DIAGNOSIS — E209 Hypoparathyroidism, unspecified: Secondary | ICD-10-CM | POA: Diagnosis not present

## 2017-05-21 DIAGNOSIS — E119 Type 2 diabetes mellitus without complications: Secondary | ICD-10-CM | POA: Diagnosis not present

## 2017-05-21 DIAGNOSIS — D509 Iron deficiency anemia, unspecified: Secondary | ICD-10-CM | POA: Diagnosis not present

## 2017-05-21 DIAGNOSIS — N2581 Secondary hyperparathyroidism of renal origin: Secondary | ICD-10-CM | POA: Diagnosis not present

## 2017-05-23 DIAGNOSIS — N186 End stage renal disease: Secondary | ICD-10-CM | POA: Diagnosis not present

## 2017-05-23 DIAGNOSIS — D631 Anemia in chronic kidney disease: Secondary | ICD-10-CM | POA: Diagnosis not present

## 2017-05-23 DIAGNOSIS — D509 Iron deficiency anemia, unspecified: Secondary | ICD-10-CM | POA: Diagnosis not present

## 2017-05-23 DIAGNOSIS — N2581 Secondary hyperparathyroidism of renal origin: Secondary | ICD-10-CM | POA: Diagnosis not present

## 2017-05-23 DIAGNOSIS — E209 Hypoparathyroidism, unspecified: Secondary | ICD-10-CM | POA: Diagnosis not present

## 2017-05-23 DIAGNOSIS — E119 Type 2 diabetes mellitus without complications: Secondary | ICD-10-CM | POA: Diagnosis not present

## 2017-05-26 DIAGNOSIS — D631 Anemia in chronic kidney disease: Secondary | ICD-10-CM | POA: Diagnosis not present

## 2017-05-26 DIAGNOSIS — N186 End stage renal disease: Secondary | ICD-10-CM | POA: Diagnosis not present

## 2017-05-26 DIAGNOSIS — N2581 Secondary hyperparathyroidism of renal origin: Secondary | ICD-10-CM | POA: Diagnosis not present

## 2017-05-26 DIAGNOSIS — D509 Iron deficiency anemia, unspecified: Secondary | ICD-10-CM | POA: Diagnosis not present

## 2017-05-26 DIAGNOSIS — E209 Hypoparathyroidism, unspecified: Secondary | ICD-10-CM | POA: Diagnosis not present

## 2017-05-26 DIAGNOSIS — E119 Type 2 diabetes mellitus without complications: Secondary | ICD-10-CM | POA: Diagnosis not present

## 2017-05-28 DIAGNOSIS — D509 Iron deficiency anemia, unspecified: Secondary | ICD-10-CM | POA: Diagnosis not present

## 2017-05-28 DIAGNOSIS — E119 Type 2 diabetes mellitus without complications: Secondary | ICD-10-CM | POA: Diagnosis not present

## 2017-05-28 DIAGNOSIS — F17290 Nicotine dependence, other tobacco product, uncomplicated: Secondary | ICD-10-CM | POA: Insufficient documentation

## 2017-05-28 DIAGNOSIS — E209 Hypoparathyroidism, unspecified: Secondary | ICD-10-CM | POA: Diagnosis not present

## 2017-05-28 DIAGNOSIS — N2581 Secondary hyperparathyroidism of renal origin: Secondary | ICD-10-CM | POA: Diagnosis not present

## 2017-05-28 DIAGNOSIS — D631 Anemia in chronic kidney disease: Secondary | ICD-10-CM | POA: Diagnosis not present

## 2017-05-28 DIAGNOSIS — N186 End stage renal disease: Secondary | ICD-10-CM | POA: Diagnosis not present

## 2017-05-30 DIAGNOSIS — N186 End stage renal disease: Secondary | ICD-10-CM | POA: Diagnosis not present

## 2017-05-30 DIAGNOSIS — N2581 Secondary hyperparathyroidism of renal origin: Secondary | ICD-10-CM | POA: Diagnosis not present

## 2017-05-30 DIAGNOSIS — D631 Anemia in chronic kidney disease: Secondary | ICD-10-CM | POA: Diagnosis not present

## 2017-05-30 DIAGNOSIS — E209 Hypoparathyroidism, unspecified: Secondary | ICD-10-CM | POA: Diagnosis not present

## 2017-05-30 DIAGNOSIS — E119 Type 2 diabetes mellitus without complications: Secondary | ICD-10-CM | POA: Diagnosis not present

## 2017-05-30 DIAGNOSIS — D509 Iron deficiency anemia, unspecified: Secondary | ICD-10-CM | POA: Diagnosis not present

## 2017-06-01 DIAGNOSIS — N2581 Secondary hyperparathyroidism of renal origin: Secondary | ICD-10-CM | POA: Diagnosis not present

## 2017-06-01 DIAGNOSIS — N186 End stage renal disease: Secondary | ICD-10-CM | POA: Diagnosis not present

## 2017-06-01 DIAGNOSIS — E209 Hypoparathyroidism, unspecified: Secondary | ICD-10-CM | POA: Diagnosis not present

## 2017-06-01 DIAGNOSIS — D631 Anemia in chronic kidney disease: Secondary | ICD-10-CM | POA: Diagnosis not present

## 2017-06-01 DIAGNOSIS — D509 Iron deficiency anemia, unspecified: Secondary | ICD-10-CM | POA: Diagnosis not present

## 2017-06-01 DIAGNOSIS — E119 Type 2 diabetes mellitus without complications: Secondary | ICD-10-CM | POA: Diagnosis not present

## 2017-06-02 DIAGNOSIS — D631 Anemia in chronic kidney disease: Secondary | ICD-10-CM | POA: Diagnosis not present

## 2017-06-02 DIAGNOSIS — D509 Iron deficiency anemia, unspecified: Secondary | ICD-10-CM | POA: Diagnosis not present

## 2017-06-02 DIAGNOSIS — N2581 Secondary hyperparathyroidism of renal origin: Secondary | ICD-10-CM | POA: Diagnosis not present

## 2017-06-02 DIAGNOSIS — E209 Hypoparathyroidism, unspecified: Secondary | ICD-10-CM | POA: Diagnosis not present

## 2017-06-02 DIAGNOSIS — N186 End stage renal disease: Secondary | ICD-10-CM | POA: Diagnosis not present

## 2017-06-02 DIAGNOSIS — E119 Type 2 diabetes mellitus without complications: Secondary | ICD-10-CM | POA: Diagnosis not present

## 2017-06-03 DIAGNOSIS — N2581 Secondary hyperparathyroidism of renal origin: Secondary | ICD-10-CM | POA: Diagnosis not present

## 2017-06-03 DIAGNOSIS — E119 Type 2 diabetes mellitus without complications: Secondary | ICD-10-CM | POA: Diagnosis not present

## 2017-06-03 DIAGNOSIS — D631 Anemia in chronic kidney disease: Secondary | ICD-10-CM | POA: Diagnosis not present

## 2017-06-03 DIAGNOSIS — N186 End stage renal disease: Secondary | ICD-10-CM | POA: Diagnosis not present

## 2017-06-03 DIAGNOSIS — E209 Hypoparathyroidism, unspecified: Secondary | ICD-10-CM | POA: Diagnosis not present

## 2017-06-03 DIAGNOSIS — D509 Iron deficiency anemia, unspecified: Secondary | ICD-10-CM | POA: Diagnosis not present

## 2017-06-06 DIAGNOSIS — N186 End stage renal disease: Secondary | ICD-10-CM | POA: Diagnosis not present

## 2017-06-06 DIAGNOSIS — E209 Hypoparathyroidism, unspecified: Secondary | ICD-10-CM | POA: Diagnosis not present

## 2017-06-06 DIAGNOSIS — D631 Anemia in chronic kidney disease: Secondary | ICD-10-CM | POA: Diagnosis not present

## 2017-06-06 DIAGNOSIS — N2581 Secondary hyperparathyroidism of renal origin: Secondary | ICD-10-CM | POA: Diagnosis not present

## 2017-06-06 DIAGNOSIS — D509 Iron deficiency anemia, unspecified: Secondary | ICD-10-CM | POA: Diagnosis not present

## 2017-06-06 DIAGNOSIS — E119 Type 2 diabetes mellitus without complications: Secondary | ICD-10-CM | POA: Diagnosis not present

## 2017-06-09 DIAGNOSIS — N186 End stage renal disease: Secondary | ICD-10-CM | POA: Diagnosis not present

## 2017-06-09 DIAGNOSIS — D631 Anemia in chronic kidney disease: Secondary | ICD-10-CM | POA: Diagnosis not present

## 2017-06-09 DIAGNOSIS — E209 Hypoparathyroidism, unspecified: Secondary | ICD-10-CM | POA: Diagnosis not present

## 2017-06-09 DIAGNOSIS — E119 Type 2 diabetes mellitus without complications: Secondary | ICD-10-CM | POA: Diagnosis not present

## 2017-06-09 DIAGNOSIS — D509 Iron deficiency anemia, unspecified: Secondary | ICD-10-CM | POA: Diagnosis not present

## 2017-06-09 DIAGNOSIS — N2581 Secondary hyperparathyroidism of renal origin: Secondary | ICD-10-CM | POA: Diagnosis not present

## 2017-06-10 ENCOUNTER — Ambulatory Visit (INDEPENDENT_AMBULATORY_CARE_PROVIDER_SITE_OTHER): Payer: Medicare Other | Admitting: Vascular Surgery

## 2017-06-10 ENCOUNTER — Encounter (INDEPENDENT_AMBULATORY_CARE_PROVIDER_SITE_OTHER): Payer: Medicare Other

## 2017-06-11 DIAGNOSIS — N186 End stage renal disease: Secondary | ICD-10-CM | POA: Diagnosis not present

## 2017-06-11 DIAGNOSIS — D631 Anemia in chronic kidney disease: Secondary | ICD-10-CM | POA: Diagnosis not present

## 2017-06-11 DIAGNOSIS — E119 Type 2 diabetes mellitus without complications: Secondary | ICD-10-CM | POA: Diagnosis not present

## 2017-06-11 DIAGNOSIS — N2581 Secondary hyperparathyroidism of renal origin: Secondary | ICD-10-CM | POA: Diagnosis not present

## 2017-06-11 DIAGNOSIS — D509 Iron deficiency anemia, unspecified: Secondary | ICD-10-CM | POA: Diagnosis not present

## 2017-06-11 DIAGNOSIS — E209 Hypoparathyroidism, unspecified: Secondary | ICD-10-CM | POA: Diagnosis not present

## 2017-06-13 DIAGNOSIS — N186 End stage renal disease: Secondary | ICD-10-CM | POA: Diagnosis not present

## 2017-06-13 DIAGNOSIS — E119 Type 2 diabetes mellitus without complications: Secondary | ICD-10-CM | POA: Diagnosis not present

## 2017-06-13 DIAGNOSIS — E209 Hypoparathyroidism, unspecified: Secondary | ICD-10-CM | POA: Diagnosis not present

## 2017-06-13 DIAGNOSIS — E1129 Type 2 diabetes mellitus with other diabetic kidney complication: Secondary | ICD-10-CM | POA: Diagnosis not present

## 2017-06-13 DIAGNOSIS — D631 Anemia in chronic kidney disease: Secondary | ICD-10-CM | POA: Diagnosis not present

## 2017-06-13 DIAGNOSIS — D509 Iron deficiency anemia, unspecified: Secondary | ICD-10-CM | POA: Diagnosis not present

## 2017-06-13 DIAGNOSIS — N2581 Secondary hyperparathyroidism of renal origin: Secondary | ICD-10-CM | POA: Diagnosis not present

## 2017-06-13 DIAGNOSIS — Z992 Dependence on renal dialysis: Secondary | ICD-10-CM | POA: Diagnosis not present

## 2017-06-16 DIAGNOSIS — N2581 Secondary hyperparathyroidism of renal origin: Secondary | ICD-10-CM | POA: Diagnosis not present

## 2017-06-16 DIAGNOSIS — N186 End stage renal disease: Secondary | ICD-10-CM | POA: Diagnosis not present

## 2017-06-16 DIAGNOSIS — E209 Hypoparathyroidism, unspecified: Secondary | ICD-10-CM | POA: Diagnosis not present

## 2017-06-16 DIAGNOSIS — D631 Anemia in chronic kidney disease: Secondary | ICD-10-CM | POA: Diagnosis not present

## 2017-06-16 DIAGNOSIS — D509 Iron deficiency anemia, unspecified: Secondary | ICD-10-CM | POA: Diagnosis not present

## 2017-06-17 DIAGNOSIS — E7800 Pure hypercholesterolemia, unspecified: Secondary | ICD-10-CM | POA: Diagnosis not present

## 2017-06-17 DIAGNOSIS — M79605 Pain in left leg: Secondary | ICD-10-CM | POA: Diagnosis not present

## 2017-06-17 DIAGNOSIS — Z79899 Other long term (current) drug therapy: Secondary | ICD-10-CM | POA: Diagnosis not present

## 2017-06-17 DIAGNOSIS — Z794 Long term (current) use of insulin: Secondary | ICD-10-CM | POA: Diagnosis not present

## 2017-06-17 DIAGNOSIS — E1165 Type 2 diabetes mellitus with hyperglycemia: Secondary | ICD-10-CM | POA: Diagnosis not present

## 2017-06-17 DIAGNOSIS — E08620 Diabetes mellitus due to underlying condition with diabetic dermatitis: Secondary | ICD-10-CM | POA: Diagnosis not present

## 2017-06-17 DIAGNOSIS — G8929 Other chronic pain: Secondary | ICD-10-CM | POA: Diagnosis not present

## 2017-06-18 DIAGNOSIS — N186 End stage renal disease: Secondary | ICD-10-CM | POA: Diagnosis not present

## 2017-06-18 DIAGNOSIS — N2581 Secondary hyperparathyroidism of renal origin: Secondary | ICD-10-CM | POA: Diagnosis not present

## 2017-06-18 DIAGNOSIS — D631 Anemia in chronic kidney disease: Secondary | ICD-10-CM | POA: Diagnosis not present

## 2017-06-18 DIAGNOSIS — D509 Iron deficiency anemia, unspecified: Secondary | ICD-10-CM | POA: Diagnosis not present

## 2017-06-18 DIAGNOSIS — E209 Hypoparathyroidism, unspecified: Secondary | ICD-10-CM | POA: Diagnosis not present

## 2017-06-19 DIAGNOSIS — T82858A Stenosis of vascular prosthetic devices, implants and grafts, initial encounter: Secondary | ICD-10-CM | POA: Diagnosis not present

## 2017-06-19 DIAGNOSIS — Z992 Dependence on renal dialysis: Secondary | ICD-10-CM | POA: Diagnosis not present

## 2017-06-19 DIAGNOSIS — N186 End stage renal disease: Secondary | ICD-10-CM | POA: Diagnosis not present

## 2017-06-19 DIAGNOSIS — I871 Compression of vein: Secondary | ICD-10-CM | POA: Diagnosis not present

## 2017-06-20 DIAGNOSIS — E209 Hypoparathyroidism, unspecified: Secondary | ICD-10-CM | POA: Diagnosis not present

## 2017-06-20 DIAGNOSIS — N2581 Secondary hyperparathyroidism of renal origin: Secondary | ICD-10-CM | POA: Diagnosis not present

## 2017-06-20 DIAGNOSIS — N186 End stage renal disease: Secondary | ICD-10-CM | POA: Diagnosis not present

## 2017-06-20 DIAGNOSIS — D631 Anemia in chronic kidney disease: Secondary | ICD-10-CM | POA: Diagnosis not present

## 2017-06-20 DIAGNOSIS — D509 Iron deficiency anemia, unspecified: Secondary | ICD-10-CM | POA: Diagnosis not present

## 2017-06-23 DIAGNOSIS — N186 End stage renal disease: Secondary | ICD-10-CM | POA: Diagnosis not present

## 2017-06-23 DIAGNOSIS — D509 Iron deficiency anemia, unspecified: Secondary | ICD-10-CM | POA: Diagnosis not present

## 2017-06-23 DIAGNOSIS — N2581 Secondary hyperparathyroidism of renal origin: Secondary | ICD-10-CM | POA: Diagnosis not present

## 2017-06-23 DIAGNOSIS — E209 Hypoparathyroidism, unspecified: Secondary | ICD-10-CM | POA: Diagnosis not present

## 2017-06-23 DIAGNOSIS — D631 Anemia in chronic kidney disease: Secondary | ICD-10-CM | POA: Diagnosis not present

## 2017-06-25 DIAGNOSIS — D509 Iron deficiency anemia, unspecified: Secondary | ICD-10-CM | POA: Diagnosis not present

## 2017-06-25 DIAGNOSIS — N2581 Secondary hyperparathyroidism of renal origin: Secondary | ICD-10-CM | POA: Diagnosis not present

## 2017-06-25 DIAGNOSIS — N186 End stage renal disease: Secondary | ICD-10-CM | POA: Diagnosis not present

## 2017-06-25 DIAGNOSIS — D631 Anemia in chronic kidney disease: Secondary | ICD-10-CM | POA: Diagnosis not present

## 2017-06-25 DIAGNOSIS — E209 Hypoparathyroidism, unspecified: Secondary | ICD-10-CM | POA: Diagnosis not present

## 2017-06-27 ENCOUNTER — Telehealth (INDEPENDENT_AMBULATORY_CARE_PROVIDER_SITE_OTHER): Payer: Self-pay | Admitting: Family

## 2017-06-27 DIAGNOSIS — N186 End stage renal disease: Secondary | ICD-10-CM | POA: Diagnosis not present

## 2017-06-27 DIAGNOSIS — N2581 Secondary hyperparathyroidism of renal origin: Secondary | ICD-10-CM | POA: Diagnosis not present

## 2017-06-27 DIAGNOSIS — E209 Hypoparathyroidism, unspecified: Secondary | ICD-10-CM | POA: Diagnosis not present

## 2017-06-27 DIAGNOSIS — D631 Anemia in chronic kidney disease: Secondary | ICD-10-CM | POA: Diagnosis not present

## 2017-06-27 DIAGNOSIS — D509 Iron deficiency anemia, unspecified: Secondary | ICD-10-CM | POA: Diagnosis not present

## 2017-06-27 NOTE — Telephone Encounter (Signed)
05/01/2017 OV NOTE FAXED TO Alison Stalling [phone]

## 2017-06-30 DIAGNOSIS — D509 Iron deficiency anemia, unspecified: Secondary | ICD-10-CM | POA: Diagnosis not present

## 2017-06-30 DIAGNOSIS — E209 Hypoparathyroidism, unspecified: Secondary | ICD-10-CM | POA: Diagnosis not present

## 2017-06-30 DIAGNOSIS — D631 Anemia in chronic kidney disease: Secondary | ICD-10-CM | POA: Diagnosis not present

## 2017-06-30 DIAGNOSIS — N2581 Secondary hyperparathyroidism of renal origin: Secondary | ICD-10-CM | POA: Diagnosis not present

## 2017-06-30 DIAGNOSIS — N186 End stage renal disease: Secondary | ICD-10-CM | POA: Diagnosis not present

## 2017-07-02 DIAGNOSIS — N186 End stage renal disease: Secondary | ICD-10-CM | POA: Diagnosis not present

## 2017-07-02 DIAGNOSIS — E209 Hypoparathyroidism, unspecified: Secondary | ICD-10-CM | POA: Diagnosis not present

## 2017-07-02 DIAGNOSIS — D509 Iron deficiency anemia, unspecified: Secondary | ICD-10-CM | POA: Diagnosis not present

## 2017-07-02 DIAGNOSIS — D631 Anemia in chronic kidney disease: Secondary | ICD-10-CM | POA: Diagnosis not present

## 2017-07-02 DIAGNOSIS — N2581 Secondary hyperparathyroidism of renal origin: Secondary | ICD-10-CM | POA: Diagnosis not present

## 2017-07-04 ENCOUNTER — Encounter (HOSPITAL_COMMUNITY): Payer: Self-pay | Admitting: Emergency Medicine

## 2017-07-04 ENCOUNTER — Ambulatory Visit (HOSPITAL_COMMUNITY)
Admission: EM | Admit: 2017-07-04 | Discharge: 2017-07-04 | Disposition: A | Discharge status: Home / Self Care | Payer: Medicare Other | Attending: Internal Medicine | Admitting: Internal Medicine

## 2017-07-04 ENCOUNTER — Other Ambulatory Visit: Payer: Self-pay

## 2017-07-04 DIAGNOSIS — E209 Hypoparathyroidism, unspecified: Secondary | ICD-10-CM | POA: Diagnosis not present

## 2017-07-04 DIAGNOSIS — D631 Anemia in chronic kidney disease: Secondary | ICD-10-CM | POA: Diagnosis not present

## 2017-07-04 DIAGNOSIS — N186 End stage renal disease: Secondary | ICD-10-CM | POA: Diagnosis not present

## 2017-07-04 DIAGNOSIS — N2581 Secondary hyperparathyroidism of renal origin: Secondary | ICD-10-CM | POA: Diagnosis not present

## 2017-07-04 DIAGNOSIS — M778 Other enthesopathies, not elsewhere classified: Secondary | ICD-10-CM | POA: Diagnosis not present

## 2017-07-04 DIAGNOSIS — D509 Iron deficiency anemia, unspecified: Secondary | ICD-10-CM | POA: Diagnosis not present

## 2017-07-04 MED ORDER — PREDNISONE 20 MG PO TABS: 40.0000 mg | ORAL_TABLET | Freq: Every day | ORAL | 0 refills | Status: AC

## 2017-07-04 NOTE — Discharge Instructions (Signed)
Start prednisone as directed.  As we discussed, this can increase your blood sugars, please adjust your insulin sliding scale as needed.  Please also inform your primary care provider of this medication change.  Ice compress to help with the inflammation.  Thumb spica splint during activity.  Follow-up with primary care physician for further management and treatment needed.

## 2017-07-04 NOTE — ED Provider Notes (Addendum)
[place]    CSN: [id number] Arrival date & time: 07/04/17  1458     History   Chief Complaint Chief Complaint  Patient presents with  . Wrist Pain    HPI BRACE [name] is a 51 y.o. male.   51 year old male with history of CHF, CKD on dialysis, type 2 diabetes, below-knee left left below-knee amputation, comes in for 3-4-day history of right wrist pain.  Denies injury and trauma.  States pain is mostly located on the radial side of the wrist.  He has been trying topical pain relievers and Tylenol with little relief.  Denies numbness, tingling, swelling, erythema, increased warmth.  Denies fever, chills, night sweats.  Last documented A1c 7.1 in 2009.  Patient has been followed by Dr. Nancy Fetter.  He cannot recall last A1c, however states "it has been good".      Past Medical History:  Diagnosis Date  . Amputee, below knee, left ([place])   . Anemia   . Blood transfusion   . Bronchitis   . Chest pain   . CHF (congestive heart failure) ([place])   . Chronic kidney disease    d[facility], [facility]F, [facility]  . Diabetes mellitus    type 2  . Dysrhythmia    irregular heartbeat  . ESRD (end stage renal disease) on dialysis ([place])    first HD 06/19/04  . GERD (gastroesophageal reflux disease)   . Hyperparathyroidism ([place])   . Morbid obesity [facility])     Patient Active Problem List   Diagnosis Date Noted  . Postop check 12/05/2016  . Status post ankle fusion 05/23/2016  . Acute chest pain 09/12/2015  . Chest pain 09/12/2015  . Septic shock ([place])   . MRSA bacteremia   . Esophageal reflux   . Infection and inflammatory reaction due to cardiac device, implant, and graft ([place])   . Diabetes mellitus type 2, uncontrolled ([place]) 04/18/2015  . Status post below knee amputation of left lower extremity ([place]) 04/17/2015  . 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 ESRD on dialysis ([place]) 10/23/2012  . DIASTOLIC DYSFUNCTION 27/74/1287    . PVD 10/22/2007  . ESRD 10/22/2007  . HYPERPARATHYROIDISM, HX OF 10/22/2007    Past Surgical History:  Procedure Laterality Date  . AMPUTATION Left 01/20/2015   Procedure: Left Great Toe Amputation;  Surgeon: Newt Minion, MD;  Location: [facility];  Service: Orthopedics;  Laterality: Left;  . AMPUTATION Left 03/07/2015   Procedure: Left Foot 1st and 2nd Ray Amputation;  Surgeon: Newt Minion, MD;  Location: [facility];  Service: Orthopedics;  Laterality: Left;  . AMPUTATION Left 04/14/2015   Procedure: LEFT BELOW THE KNEE AMPUTATION ;  Surgeon: Newt Minion, MD;  Location: [place];  Service: Orthopedics;  Laterality: Left;  . ANKLE FUSION     rt foot charcot joint 2008 with refusion  . AV FISTULA PLACEMENT  10/11/2010  . AV FISTULA PLACEMENT  07/02/2011   Procedure: INSERTION OF ARTERIOVENOUS (AV) GORE-TEX GRAFT ARM;  Surgeon: Elam Dutch, MD;  Location: [place];  Service: Vascular;  Laterality: Left;  . AV FISTULA PLACEMENT Right 11/06/2012   Procedure: INSERTION OF ARTERIOVENOUS (AV) GORE-TEX GRAFT ARM;  Surgeon: Mal Misty, MD;  Location: [facility];  Service: Vascular;  Laterality: Right;  . AV FISTULA PLACEMENT Right 03/21/2016   Procedure: INSERTION OF ARTERIOVENOUS (AV) GORE-TEX GRAFT ARM ( LOOP FOREARM );  Surgeon: Algernon Huxley, MD;  Location: [facility];  Service: Vascular;  Laterality: Right;  . AV FISTULA PLACEMENT Left 11/07/2016   Procedure: INSERTION OF ARTERIOVENOUS (AV) Artegraft;  Surgeon: Algernon Huxley, MD;  Location: [facility];  Service: Vascular;  Laterality: Left;  . [place] REMOVAL Right 11/06/2012   Procedure: REMOVAL OF ARTERIOVENOUS GORETEX GRAFT ([place]);  Surgeon: Mal Misty, MD;  Location: [facility];  Service: Vascular;  Laterality: Right;  . ENDARTERECTOMY FEMORAL  11/07/2016   Procedure: ENDARTERECTOMY FEMORAL;  Surgeon: Algernon Huxley, MD;  Location: [facility];  Service: Vascular;;  . HERNIA REPAIR     Umbilical Hernia  . INSERTION OF DIALYSIS CATHETER  01/20/2012   Procedure:  INSERTION OF DIALYSIS CATHETER;  Surgeon: Serafina Mitchell, MD;  Location: [facility];  Service: Vascular;  Laterality: N/A;  insertion left internal jugular dialysis catheter 23cm  . INSERTION OF DIALYSIS CATHETER Right 11/09/2012   Procedure: INSERTION OF DIALYSIS CATHETER;  Surgeon: Conrad [place], MD;  Location: [facility];  Service: Vascular;  Laterality: Right;  Right  Femoral Vein  . INSERTION OF DIALYSIS CATHETER N/A 08/18/2015   Procedure: INSERTION OF DIALYSIS CATHETER RIGHT FEMORAL VEIN;  Surgeon: Mal Misty, MD;  Location: [facility];  Service: Cardiovascular;  Laterality: N/A;  . PARATHYROIDECTOMY    . PERIPHERAL VASCULAR CATHETERIZATION Right 04/04/2016   Procedure: A/V Shuntogram/Fistulagram;  Surgeon: Algernon Huxley, MD;  Location: [place] [facility];  Service: Cardiovascular;  Laterality: Right;  . REMOVAL OF A HERO DEVICE Left 08/14/2015   Procedure: REMOVAL OF LEFT ARM HERO DEVICE;  Surgeon: Rosetta Posner, MD;  Location: [facility];  Service: Vascular;  Laterality: Left;  . TEE WITHOUT CARDIOVERSION N/A 08/18/2015   Procedure: TRANSESOPHAGEAL ECHOCARDIOGRAM (TEE);  Surgeon: Mal Misty, MD;  Location: [facility] [place];  Service: Cardiovascular;  Laterality: N/A;  . TEE WITHOUT CARDIOVERSION N/A 12/21/2015   Procedure: TRANSESOPHAGEAL ECHOCARDIOGRAM (TEE);  Surgeon: Dixie Dials, MD;  Location: [facility] ENDOSCOPY;  Service: Cardiovascular;  Laterality: N/A;  . THROMBECTOMY    . Umbilcial Hernia    . UPPER EXTREMITY VENOGRAPHY Left 10/31/2016   Procedure: Upper Extremity Venography;  Surgeon: Algernon Huxley, MD;  Location: [place] [facility];  Service: Cardiovascular;  Laterality: Left;  Marland Kitchen VENOGRAM N/A 01/06/2014   Procedure: VENOGRAM;  Surgeon: Conrad [place], MD;  Location: [facility] CATH LAB;  Service: Cardiovascular;  Laterality: N/A;       Home Medications    Prior to Admission medications   Medication Sig Start Date End Date Taking? Authorizing Provider  aspirin EC 81 MG EC tablet Take 1 tablet (81 mg total) by mouth  daily. 08/19/15  Yes Burns, Arloa Koh, MD  AURYXIA 1 GM 210 MG(Fe) tablet Take 1 tablet by mouth 3 (three) times daily with meals. 10/24/16  Yes [provider]  b complex-vitamin c-folic acid (NEPHRO-VITE) 0.8 MG TABS tablet Take 1 tablet by mouth at bedtime. 04/26/15  Yes Angiulli, Lavon Paganini, PA-C  Dulaglutide (TRULICITY) 6.23 JS/2.8BT SOPN Inject 0.75 mg into the skin every Friday.    Yes [provider]  insulin detemir (LEVEMIR) 100 UNIT/ML injection Inject 0.2 mLs (20 Units total) into the skin at bedtime. Patient taking differently: Inject 5-20 Units into the skin at bedtime. Per sliding scale 04/26/15  Yes Angiulli, Lavon Paganini, PA-C  NOVOLOG FLEXPEN 100 UNIT/ML FlexPen  12/04/16  Yes [provider]  oxyCODONE-acetaminophen (PERCOCET/ROXICET) 5-325 MG tablet Take 1 tablet by mouth every 6 (six) hours as needed for severe pain. 12/03/16  Yes Lawyer,  Lacie Draft  VENTOLIN HFA 108 [id number] Base) MCG/ACT inhaler  11/06/16  Yes [provider]  predniSONE (DELTASONE) 20 MG tablet Take 2 tablets (40 mg total) by mouth daily for 4 days. 07/04/17 07/08/17  Ok Edwards, PA-C    Family History Family History  Problem Relation Age of Onset  . Heart disease Mother   . Kidney disease Mother   . Hypertension Mother     Social History Social History   Tobacco Use  . Smoking status: Former Smoker    Years: 20.00    Types: Cigarettes    Last attempt to quit: 01/13/2015    Years since quitting: 2.4  . Smokeless tobacco: Never Used  . Tobacco comment: quit 2 months ago  Substance Use Topics  . Alcohol use: No    Alcohol/week: 0.0 oz  . Drug use: No     Allergies   Hydrocodone   Review of Systems Review of Systems  Reason unable to perform ROS: See HPI as above.     Physical Exam Triage Vital Signs ED Triage Vitals [07/04/17 1525]  Enc Vitals Group     BP (!) 110/51     Pulse Rate 99     Resp 18     Temp 98.1 F (36.7 C)     Temp src      SpO2 100 %      Weight      Height      Head Circumference      Peak Flow      Pain Score 10     Pain Loc      Pain Edu?      Excl. in [place]?    No data found.  Updated Vital Signs BP (!) 110/51   Pulse 99   Temp 98.1 F (36.7 C)   Resp 18   SpO2 100%   Physical Exam  Constitutional: He is oriented to person, place, and time. He appears well-developed and well-nourished. No distress.  HENT:  Head: Normocephalic and atraumatic.  Eyes: Conjunctivae are normal. Pupils are equal, round, and reactive to light.  Musculoskeletal:  No swelling, erythema, increased warmth, contusions.  Tenderness on palpation of radial aspect of wrist.  Full range of motion.  Strength normal and equal bilaterally.  Sensation intact and equal bilaterally.  Radial pulses 1+ and equal bilaterally.  Cap refill less than 2 seconds.  Positive Finkelstein test.  Neurological: He is alert and oriented to person, place, and time.     UC Treatments / Results  Labs (all labs ordered are listed, but only abnormal results are displayed) Labs Reviewed - No data to display  Lab Results  Component Value Date   HGBA1C (H) 07/30/2007    7.1 (NOTE)   The ADA recommends the following therapeutic goals for glycemic   control related to Hgb A1C measurement:   Goal of Therapy:   < 7.0% Hgb A1C   Action Suggested:  > 8.0% Hgb A1C   Ref:  Diabetes Care, 22, Suppl. 1, 1999     EKG  EKG Interpretation None       Radiology No results found.  Procedures Procedures (including critical care time)  Medications Ordered in UC Medications - No data to display   Initial Impression / Assessment and Plan / UC Course  I have reviewed the triage vital signs and the nursing notes.  Pertinent labs & imaging results that were available during my care of the patient were reviewed by  me and considered in my medical decision making (see chart for details).    Discussed with patient history and exam most consistent with de Quervain's.   Given CKD on dialysis,  will defer NSAID use.  Patient on insulin and able to do sliding scale, will start prednisone, and have patient adjust insulin as needed.  Patient to inform PCP of recent medication change.  Thumb spica provided.  Ice compress as directed.  Return precautions given.  Patient expresses understanding and agrees to plan.  Final Clinical Impressions(s) / UC Diagnoses   Final diagnoses:  Tendinitis of right wrist    ED Discharge Orders        Ordered    predniSONE (DELTASONE) 20 MG tablet  Daily     07/04/17 1707       Ok Edwards, PA-C 07/04/17 1716    Ok Edwards, PA-C 07/04/17 1717

## 2017-07-04 NOTE — ED Triage Notes (Signed)
Pt c/o R wrist pain x3-4 days, states "I feel like it has inflammation". Denies injury, denies hx of gout or arthritis. States hes had similar issues in the past.

## 2017-07-06 DIAGNOSIS — D631 Anemia in chronic kidney disease: Secondary | ICD-10-CM | POA: Diagnosis not present

## 2017-07-06 DIAGNOSIS — D509 Iron deficiency anemia, unspecified: Secondary | ICD-10-CM | POA: Diagnosis not present

## 2017-07-06 DIAGNOSIS — N2581 Secondary hyperparathyroidism of renal origin: Secondary | ICD-10-CM | POA: Diagnosis not present

## 2017-07-06 DIAGNOSIS — N186 End stage renal disease: Secondary | ICD-10-CM | POA: Diagnosis not present

## 2017-07-06 DIAGNOSIS — E209 Hypoparathyroidism, unspecified: Secondary | ICD-10-CM | POA: Diagnosis not present

## 2017-07-09 DIAGNOSIS — E209 Hypoparathyroidism, unspecified: Secondary | ICD-10-CM | POA: Diagnosis not present

## 2017-07-09 DIAGNOSIS — N186 End stage renal disease: Secondary | ICD-10-CM | POA: Diagnosis not present

## 2017-07-09 DIAGNOSIS — N2581 Secondary hyperparathyroidism of renal origin: Secondary | ICD-10-CM | POA: Diagnosis not present

## 2017-07-09 DIAGNOSIS — D631 Anemia in chronic kidney disease: Secondary | ICD-10-CM | POA: Diagnosis not present

## 2017-07-09 DIAGNOSIS — D509 Iron deficiency anemia, unspecified: Secondary | ICD-10-CM | POA: Diagnosis not present

## 2017-07-11 DIAGNOSIS — E209 Hypoparathyroidism, unspecified: Secondary | ICD-10-CM | POA: Diagnosis not present

## 2017-07-11 DIAGNOSIS — D509 Iron deficiency anemia, unspecified: Secondary | ICD-10-CM | POA: Diagnosis not present

## 2017-07-11 DIAGNOSIS — D631 Anemia in chronic kidney disease: Secondary | ICD-10-CM | POA: Diagnosis not present

## 2017-07-11 DIAGNOSIS — N2581 Secondary hyperparathyroidism of renal origin: Secondary | ICD-10-CM | POA: Diagnosis not present

## 2017-07-11 DIAGNOSIS — N186 End stage renal disease: Secondary | ICD-10-CM | POA: Diagnosis not present

## 2017-07-13 DIAGNOSIS — D509 Iron deficiency anemia, unspecified: Secondary | ICD-10-CM | POA: Diagnosis not present

## 2017-07-13 DIAGNOSIS — N2581 Secondary hyperparathyroidism of renal origin: Secondary | ICD-10-CM | POA: Diagnosis not present

## 2017-07-13 DIAGNOSIS — N186 End stage renal disease: Secondary | ICD-10-CM | POA: Diagnosis not present

## 2017-07-13 DIAGNOSIS — D631 Anemia in chronic kidney disease: Secondary | ICD-10-CM | POA: Diagnosis not present

## 2017-07-13 DIAGNOSIS — E209 Hypoparathyroidism, unspecified: Secondary | ICD-10-CM | POA: Diagnosis not present

## 2017-07-14 DIAGNOSIS — Z992 Dependence on renal dialysis: Secondary | ICD-10-CM | POA: Diagnosis not present

## 2017-07-14 DIAGNOSIS — N186 End stage renal disease: Secondary | ICD-10-CM | POA: Diagnosis not present

## 2017-07-14 DIAGNOSIS — E1129 Type 2 diabetes mellitus with other diabetic kidney complication: Secondary | ICD-10-CM | POA: Diagnosis not present

## 2017-07-19 ENCOUNTER — Emergency Department (HOSPITAL_COMMUNITY)
Admission: EM | Admit: 2017-07-19 | Discharge: 2017-07-19 | Disposition: A | Discharge status: Home / Self Care | Payer: Medicare Other | Attending: Emergency Medicine | Admitting: Emergency Medicine

## 2017-07-19 ENCOUNTER — Other Ambulatory Visit: Payer: Self-pay

## 2017-07-19 DIAGNOSIS — I509 Heart failure, unspecified: Secondary | ICD-10-CM | POA: Diagnosis not present

## 2017-07-19 DIAGNOSIS — L03113 Cellulitis of right upper limb: Secondary | ICD-10-CM

## 2017-07-19 DIAGNOSIS — Z87891 Personal history of nicotine dependence: Secondary | ICD-10-CM | POA: Insufficient documentation

## 2017-07-19 DIAGNOSIS — Z794 Long term (current) use of insulin: Secondary | ICD-10-CM | POA: Diagnosis not present

## 2017-07-19 DIAGNOSIS — L02414 Cutaneous abscess of left upper limb: Secondary | ICD-10-CM | POA: Insufficient documentation

## 2017-07-19 DIAGNOSIS — Z79899 Other long term (current) drug therapy: Secondary | ICD-10-CM | POA: Diagnosis not present

## 2017-07-19 DIAGNOSIS — Z7982 Long term (current) use of aspirin: Secondary | ICD-10-CM | POA: Insufficient documentation

## 2017-07-19 DIAGNOSIS — N186 End stage renal disease: Secondary | ICD-10-CM | POA: Insufficient documentation

## 2017-07-19 DIAGNOSIS — E1122 Type 2 diabetes mellitus with diabetic chronic kidney disease: Secondary | ICD-10-CM | POA: Insufficient documentation

## 2017-07-19 MED ORDER — SULFAMETHOXAZOLE-TRIMETHOPRIM 800-160 MG PO TABS
1.0000 | ORAL_TABLET | Freq: Once | ORAL | Status: AC
  Administered 2017-07-19: 1 via ORAL
  Filled 2017-07-19: qty 1

## 2017-07-19 MED ORDER — CEPHALEXIN 500 MG PO CAPS: 500.0000 mg | ORAL_CAPSULE | Freq: Two times a day (BID) | ORAL | 0 refills | Status: AC

## 2017-07-19 MED ORDER — SULFAMETHOXAZOLE-TRIMETHOPRIM 400-80 MG PO TABS: 1.0000 | ORAL_TABLET | Freq: Every day | ORAL | 0 refills | Status: AC

## 2017-07-19 MED ORDER — CEPHALEXIN 500 MG PO CAPS
500.0000 mg | ORAL_CAPSULE | Freq: Once | ORAL | Status: AC
Start: 2017-07-19 — End: 2017-07-19
  Administered 2017-07-19: 500 mg via ORAL
  Filled 2017-07-19: qty 1

## 2017-07-19 NOTE — Discharge Instructions (Signed)
Please see the information and instructions below regarding your visit.  Your diagnoses today include:  1. Cellulitis of right upper extremity    Cellulitis is a superficial skin infection. Please take your antibiotics as prescribed for their ENTIRE prescribed duration.   Tests performed today include: See side panel of your discharge paperwork for testing performed today. Vital signs are listed at the bottom of these instructions.   Medications prescribed:    Take any prescribed medications only as prescribed, and any over the counter medications only as directed on the packaging.  1. Please take all of your antibiotics until finished.   You may develop abdominal discomfort or nausea from the antibiotic. If this occurs, you may take it with food. Some patients also get diarrhea with antibiotics. You may help offset this with probiotics which you can buy or get in yogurt. Do not eat or take the probiotics until 2 hours after your antibiotic.   Some people develop allergies to antibiotics. Symptoms of antibiotic allergy can be mild and include a flat rash and itching. They can also be more serious and include:  ?Hives - Hives are raised, red patches of skin that are usually very itchy.  ?Lip or tongue swelling  ?Trouble swallowing or breathing  ?Blistering of the skin or mouth.  If you have any of these serious symptoms, please seek emergency medical care immediately.  I spoke with the pharmacist regarding your antibiotics.  You are to take 1 pill of the Bactrim for the next 6 days.  You were given the first dose here in the emergency department.  He will also take Keflex twice daily for the next 7 days.  You were given your first dose here in the emergency department.  Please ensure that she you take the Bactrim after your dialysis session on the days you have dialysis.  Home care instructions:  Please follow any educational materials contained in this packet.    Keep affected area  above the level of your heart when possible. Wash area gently twice a day with warm soapy water. Do not apply alcohol or hydrogen peroxide. Cover the area if it draining or weeping.   Follow-up instructions: Please follow-up with your primary care provider or the ED in 48 hours for a check of the infection if symptoms are not improving.   Return instructions:  Please return to the Emergency Department if you experience worsening symptoms. Call your doctor sooner or return to the ER if you develop worsening signs of infection such as: increased redness, increased pain, pus, fever, or other symptoms that concern you. Please monitor the area we marked with a pen today. Please return if you have any other emergent concerns.  Additional Information:   Your vital signs today were: BP 136/71 (BP Location: Left Arm)    Pulse 97    Temp 98.3 F (36.8 C) (Oral)    Resp 18    Ht 6' (1.829 m)    Wt 108.9 kg (240 lb)    SpO2 99%    BMI 32.55 kg/m  If your blood pressure (BP) was elevated on multiple readings during this visit above 130 for the top number or above 80 for the bottom number, please have this repeated by your primary care provider within one month. --------------  Thank you for allowing [place] to participate in your care today. It was a pleasure taking care of you today!

## 2017-07-19 NOTE — ED Provider Notes (Signed)
[facility] DEPT Provider Note   CSN: [id number] Arrival date & time: 07/19/17  1713     History   Chief Complaint Chief Complaint  Patient presents with  . Abscess    HPI [name] is a 52 y.o. male.  HPI   Patient is a 52 year old male with a history of CHF, ESRD on dialysis, type 2 diabetes mellitus, and left BKA presenting for 3-4 days of erythema and tightness of his right wrist.  Patient reports that a couple weeks ago, he was diagnosed with a tendinitis of the right wrist which has since improved.  Patient reports he has a history of multiple prior abscesses that have required incision and drainage, however he has never had one on his arm.  Patient has multiple ulcerations all over upper and lower extremities that he reports are due to the phosphorus deposition in his skin.  Patient denies any fevers, chills, nausea, or vomiting.  Patient denies any purulent drainage coming from the site.  Patient reports he is felt a tightness spreading down his right arm and hand, but no loss of sensation or weakness.  Patient has been applying warm compresses 3 times daily for symptoms.  Patient is taking oxycodone 5 mg prescribed by primary provider.  AV fistula is in the left groin.  Past Medical History:  Diagnosis Date  . Amputee, below knee, left ([place])   . Anemia   . Blood transfusion   . Bronchitis   . Chest pain   . CHF (congestive heart failure) ([place])   . Chronic kidney disease    dialysis, M[facility]W-F, [facility]  . Diabetes mellitus    type 2  . Dysrhythmia    irregular heartbeat  . ESRD (end stage renal disease) on dialysis ([place])    first HD 06/19/04  . GERD (gastroesophageal reflux disease)   . Hyperparathyroidism ([place])   . Morbid obesity [facility] [place])     Patient Active Problem List   Diagnosis Date Noted  . Postop check 12/05/2016  . Status post ankle fusion 05/23/2016  . Acute chest pain 09/12/2015  . Chest pain 09/12/2015  . Septic  shock ([place])   . MRSA bacteremia   . Esophageal reflux   . Infection and inflammatory reaction due to cardiac device, implant, and graft ([place])   . Diabetes mellitus type 2, uncontrolled ([place]) 04/18/2015  . Status post below knee amputation of left lower extremity ([place]) 04/17/2015  . 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 ESRD on dialysis ([place]) 10/23/2012  . DIASTOLIC DYSFUNCTION 85/46/2703  . PVD 10/22/2007  . ESRD 10/22/2007  . HYPERPARATHYROIDISM, HX OF 10/22/2007    Past Surgical History:  Procedure Laterality Date  . AMPUTATION Left 01/20/2015   Procedure: Left Great Toe Amputation;  Surgeon: Newt Minion, MD;  Location: [place];  Service: Orthopedics;  Laterality: Left;  . AMPUTATION Left 03/07/2015   Procedure: Left Foot 1st and 2nd Ray Amputation;  Surgeon: Newt Minion, MD;  Location: [place];  Service: Orthopedics;  Laterality: Left;  . AMPUTATION Left 04/14/2015   Procedure: LEFT BELOW THE KNEE AMPUTATION ;  Surgeon: Newt Minion, MD;  Location: [place];  Service: Orthopedics;  Laterality: Left;  . ANKLE FUSION     rt foot charcot joint 2008 with refusion  . AV FISTULA PLACEMENT  10/11/2010  . AV FISTULA PLACEMENT  07/02/2011   Procedure: INSERTION OF ARTERIOVENOUS (AV) GORE-TEX GRAFT ARM;  Surgeon: Jessy Oto  Fields, MD;  Location: [place];  Service: Vascular;  Laterality: Left;  . AV FISTULA PLACEMENT Right 11/06/2012   Procedure: INSERTION OF ARTERIOVENOUS (AV) GORE-TEX GRAFT ARM;  Surgeon: Mal Misty, MD;  Location: [place];  Service: Vascular;  Laterality: Right;  . AV FISTULA PLACEMENT Right 03/21/2016   Procedure: INSERTION OF ARTERIOVENOUS (AV) GORE-TEX GRAFT ARM ( LOOP FOREARM );  Surgeon: Algernon Huxley, MD;  Location: [facility];  Service: Vascular;  Laterality: Right;  . AV FISTULA PLACEMENT Left 11/07/2016   Procedure: INSERTION OF ARTERIOVENOUS (AV) Artegraft;  Surgeon: Algernon Huxley, MD;  Location: [facility];  Service: Vascular;   Laterality: Left;  . [place] REMOVAL Right 11/06/2012   Procedure: REMOVAL OF ARTERIOVENOUS GORETEX GRAFT ([place]);  Surgeon: Mal Misty, MD;  Location: [place];  Service: Vascular;  Laterality: Right;  . ENDARTERECTOMY FEMORAL  11/07/2016   Procedure: ENDARTERECTOMY FEMORAL;  Surgeon: Algernon Huxley, MD;  Location: [facility];  Service: Vascular;;  . HERNIA REPAIR     Umbilical Hernia  . INSERTION OF DIALYSIS CATHETER  01/20/2012   Procedure: INSERTION OF DIALYSIS CATHETER;  Surgeon: Serafina Mitchell, MD;  Location: [place];  Service: Vascular;  Laterality: N/A;  insertion left internal jugular dialysis catheter 23cm  . INSERTION OF DIALYSIS CATHETER Right 11/09/2012   Procedure: INSERTION OF DIALYSIS CATHETER;  Surgeon: Conrad [place] Po[place]ett, MD;  Location: [place];  Service: Vascular;  Laterality: Right;  Right  Femoral Vein  . INSERTION OF DIALYSIS CATHETER N/A 08/18/2015   Procedure: INSERTION OF DIALYSIS CATHETER RIGHT FEMORAL VEIN;  Surgeon: Mal Misty, MD;  Location: [place];  Service: Cardiovascular;  Laterality: N/A;  . PARATHYROIDECTOMY    . PERIPHERAL VASCULAR CATHETERIZATION Right 04/04/2016   Procedure: A/V Shuntogram/Fistulagram;  Surgeon: Algernon Huxley, MD;  Location: [place] [facility];  Service: Cardiovascular;  Laterality: Right;  . REMOVAL OF A HERO DEVICE Left 08/14/2015   Procedure: REMOVAL OF LEFT ARM HERO DEVICE;  Surgeon: Rosetta Posner, MD;  Location: [place];  Service: Vascular;  Laterality: Left;  . TEE WITHOUT CARDIOVERSION N/A 08/18/2015   Procedure: TRANSESOPHAGEAL ECHOCARDIOGRAM (TEE);  Surgeon: Mal Misty, MD;  Location: [place];  Service: Cardiovascular;  Laterality: N/A;  . TEE WITHOUT CARDIOVERSION N/A 12/21/2015   Procedure: TRANSESOPHAGEAL ECHOCARDIOGRAM (TEE);  Surgeon: Dixie Dials, MD;  [facility];  Service: Cardiovascular;  Laterality: N/A;  . THROMBECTOMY    . Umbilcial Hernia    . UPPER EXTREMITY VENOGRAPHY Left 10/31/2016   Procedure: Upper Extremity Venography;   Surgeon: Algernon Huxley, MD;  Location: [place] [facility];  Service: Cardiovascular;  Laterality: Left;  [place] Kitchen VENOGRAM N/A 01/06/2014   Procedure: VENOGRAM;  Surgeon: Conrad Bynum, MD;  Location: [facility];  Service: Cardiovascular;  Laterality: N/A;       Home Medications    Prior to Admission medications   Medication Sig Start Date End Date Taking? Authorizing Provider  aspirin EC 81 MG EC tablet Take 1 tablet (81 mg total) by mouth daily. 08/19/15   Burns, Arloa Koh, MD  AURYXIA 1 GM 210 MG(Fe) tablet Take 1 tablet by mouth 3 (three) times daily with meals. 10/24/16   [provider]  b complex-vitamin c-folic acid (NEPHRO-VITE) 0.8 MG TABS tablet Take 1 tablet by mouth at bedtime. 04/26/15   Angiulli, Lavon Paganini, PA-C  cephALEXin (KEFLEX) 500 MG capsule Take 1 capsule (500 mg total) by mouth 2 (two) times daily for 7  days. 07/19/17 07/26/17  Langston Masker B, PA-C  Dulaglutide (TRULICITY) 2.20 UR/4.2HC SOPN Inject 0.75 mg into the skin every Friday.     [provider]  insulin detemir (LEVEMIR) 100 UNIT/ML injection Inject 0.2 mLs (20 Units total) into the skin at bedtime. Patient taking differently: Inject 5-20 Units into the skin at bedtime. Per sliding scale 04/26/15   Angiulli, Lavon Paganini, PA-C  NOVOLOG FLEXPEN 100 UNIT/ML FlexPen  12/04/16   [provider]  oxyCODONE-acetaminophen (PERCOCET/ROXICET) 5-325 MG tablet Take 1 tablet by mouth every 6 (six) hours as needed for severe pain. 12/03/16   Lawyer, Harrell Gave, PA-C  sulfamethoxazole-trimethoprim (BACTRIM,SEPTRA) 400-80 MG tablet Take 1 tablet by mouth daily for 6 days. Take AFTER dialysis on dialysis days. 07/19/17 07/25/17  Albesa Seen, PA-C  VENTOLIN HFA [phone] Base) MCG/ACT inhaler  11/06/16   [provider]    Family History Family History  Problem Relation Age of Onset  . Heart disease Mother   . Kidney disease Mother   . Hypertension Mother     Social History Social History   Tobacco  Use  . Smoking status: Former Smoker    Years: 20.00    Types: Cigarettes    Last attempt to quit: 01/13/2015    Years since quitting: 2.5  . Smokeless tobacco: Never Used  . Tobacco comment: quit 2 months ago  Substance Use Topics  . Alcohol use: No    Alcohol/week: 0.0 oz  . Drug use: No     Allergies   Hydrocodone   Review of Systems Review of Systems  Constitutional: Negative for chills and fever.  Gastrointestinal: Negative for nausea and vomiting.  Musculoskeletal: Negative for joint swelling.  Skin: Positive for color change and rash.  Neurological: Negative for weakness and numbness.     Physical Exam Updated Vital Signs BP 124/70 (BP Location: Left Arm)   Pulse 96   Temp 98.3 F (36.8 C) (Oral)   Resp 18   Ht 6' (1.829 m)   Wt 108.9 kg (240 lb)   SpO2 100%   BMI 32.55 kg/m   Physical Exam  Constitutional: He appears well-developed and well-nourished. No distress.  Sitting comfortably in bed.  HENT:  Head: Normocephalic and atraumatic.  Eyes: Conjunctivae are normal. Right eye exhibits no discharge. Left eye exhibits no discharge.  EOMs normal to gross examination.  Neck: Normal range of motion.  Cardiovascular: Normal rate and regular rhythm.  Intact, 2+ radial and ulnar pulses of the right upper extremity.  Non-tachycardic.  Pulmonary/Chest:  Normal respiratory effort. Patient converses comfortably. No audible wheeze or stridor.  Abdominal: He exhibits no distension.  Musculoskeletal: Normal range of motion.  Left BKA present.  Neurological: He is alert.  Cranial nerves intact to gross observation. Sensation intact to light touch in the distal right upper extremity.  Grip strength 5 out of 5 in the right upper extremity.  Skin: Skin is warm and dry. He is not diaphoretic.     There is a circular area of induration on the right dorsal forearm measuring approximately 5 cm x 5 cm.  There is no obvious fluctuance. There are multiple punctate scars  and ulcerations without erythema over bilateral arms and bilateral legs.  Psychiatric: He has a normal mood and affect. His behavior is normal. Judgment and thought content normal.  Nursing note and vitals reviewed.    ED Treatments / Results  Labs (all labs ordered are listed, but only abnormal results are displayed) Labs  Reviewed - No data to display  EKG  EKG Interpretation None       Radiology No results found.  Procedures Procedures (including critical care time) \EMERGENCY DEPARTMENT US SOFT TISSUE INTERPRETATION "Study: Limited Soft Tissue Ultrasound"  INDICATIONS: Soft tissue infection Multiple views of the body part were obtained in real-time with a multi-frequency linear probe PERFORMED BY:  Myself IMAGES ARCHIVED?: Yes SIDE:Right  BODY PART:Upper extremity FINDINGS: Cellulitis present and Other possible early fluid collection consistent with abscess. INTERPRETATION:  Cellulitis present   CPT: Neck [id number]  Upper extremity 89211-94  Axilla 17408-14  Chest wall 48185-63  Beast 14970-26  Upper back 37858-85  Lower back 02774-12  Abdominal wall 87867-67  Pelvic wall 20947-09  Lower extremity 62836-62  Other soft tissue 94765-46   Medications Ordered in ED Medications  sulfamethoxazole-trimethoprim (BACTRIM DS,SEPTRA DS) 800-160 MG per tablet 1 tablet (1 tablet Oral Given 07/19/17 2116)  cephALEXin (KEFLEX) capsule 500 mg (500 mg Oral Given 07/19/17 2116)     Initial Impression / Assessment and Plan / ED Course  I have reviewed the triage vital signs and the nursing notes.  Pertinent labs & imaging results that were available during my care of the patient were reviewed by me and considered in my medical decision making (see chart for details).     Final Clinical Impressions(s) / ED Diagnoses   Final diagnoses:  Cellulitis of right upper extremity   Patient is nontoxic-appearing, non-tachycardic and afebrile.  Patient does have multiple  risk factors for immunosuppression including ESRD and type 2 diabetes mellitus.  Patient without concerning features for sepsis or systemic infection today.  Ultrasound performed at bedside demonstrates cellulitis of the area of erythema as well as a possible small punctate area of fluid collection that is not amenable to drainage by I&D.  This was independently evaluated under ultrasound by Dr. Tanna Furry who is in agreement.  I spoke with pharmacist, Junie Panning, who assisted in renal dosing of Bactrim and Keflex.  Patient to finish course and follow-up with a wound check with his primary care provider in 2 days.  Area of erythema outlined today.  Patient instructed to continue warm compresses.  Return precautions given for any spreading erythema, fever or chills with symptoms, or significant purulent drainage.  Patient to take home oxycodone for his symptoms.  Patient is in understanding and agrees with the plan of care.  This is a shared visit with Dr. Tanna Furry. Patient was independently evaluated by this attending physician. Attending physician consulted in evaluation and discharge management.   ED Discharge Orders        Ordered    sulfamethoxazole-trimethoprim (BACTRIM,SEPTRA) 400-80 MG tablet  Daily     07/19/17 2100    cephALEXin (KEFLEX) 500 MG capsule  2 times daily     07/19/17 2100       Tamala Julian 07/19/17 2339    Langston Masker B, PA-C 07/19/17 2351    Tanna Furry, MD 07/29/17 [phone]

## 2017-07-19 NOTE — ED Triage Notes (Signed)
Pt from home with c/o left arm abscess. Pt's skin is tight and red looking.  Pt reports he is diabetic. Pt reports the boil has been there approx. A week.  Pt reports pain shooting up his arm.

## 2017-09-08 ENCOUNTER — Telehealth (INDEPENDENT_AMBULATORY_CARE_PROVIDER_SITE_OTHER): Payer: Self-pay

## 2017-09-08 NOTE — Telephone Encounter (Signed)
Patient called to let us know that his wrist and hand is swollen and he would like to know what to do since this is the arm that you did the graft on?

## 2017-09-09 NOTE — Telephone Encounter (Signed)
Called the patient back to let him know that Dr. Lucky Cowboy suggest that he has a ultrasound done (HDA) to see where his swelling may be coming from, as it very well may not be coming from the graft.

## 2017-10-02 ENCOUNTER — Encounter (INDEPENDENT_AMBULATORY_CARE_PROVIDER_SITE_OTHER): Payer: Medicare Other

## 2017-10-02 ENCOUNTER — Ambulatory Visit (INDEPENDENT_AMBULATORY_CARE_PROVIDER_SITE_OTHER): Payer: Medicare Other | Admitting: Vascular Surgery

## 2017-10-16 ENCOUNTER — Ambulatory Visit (INDEPENDENT_AMBULATORY_CARE_PROVIDER_SITE_OTHER): Payer: Medicare Other | Admitting: Vascular Surgery

## 2017-10-16 ENCOUNTER — Encounter (INDEPENDENT_AMBULATORY_CARE_PROVIDER_SITE_OTHER): Payer: Medicare Other

## 2018-02-18 ENCOUNTER — Emergency Department (HOSPITAL_COMMUNITY)
Admission: EM | Admit: 2018-02-18 | Discharge: 2018-02-18 | Disposition: A | Discharge status: Home / Self Care | Payer: Medicare Other | Attending: Emergency Medicine | Admitting: Emergency Medicine

## 2018-02-18 ENCOUNTER — Encounter (HOSPITAL_COMMUNITY): Payer: Self-pay | Admitting: Emergency Medicine

## 2018-02-18 ENCOUNTER — Other Ambulatory Visit: Payer: Self-pay

## 2018-02-18 DIAGNOSIS — Z87891 Personal history of nicotine dependence: Secondary | ICD-10-CM | POA: Insufficient documentation

## 2018-02-18 DIAGNOSIS — Z992 Dependence on renal dialysis: Secondary | ICD-10-CM | POA: Diagnosis not present

## 2018-02-18 DIAGNOSIS — Z7982 Long term (current) use of aspirin: Secondary | ICD-10-CM | POA: Insufficient documentation

## 2018-02-18 DIAGNOSIS — I509 Heart failure, unspecified: Secondary | ICD-10-CM | POA: Diagnosis not present

## 2018-02-18 DIAGNOSIS — Z79899 Other long term (current) drug therapy: Secondary | ICD-10-CM | POA: Insufficient documentation

## 2018-02-18 DIAGNOSIS — R220 Localized swelling, mass and lump, head: Secondary | ICD-10-CM | POA: Diagnosis present

## 2018-02-18 DIAGNOSIS — Z794 Long term (current) use of insulin: Secondary | ICD-10-CM | POA: Insufficient documentation

## 2018-02-18 DIAGNOSIS — L03211 Cellulitis of face: Secondary | ICD-10-CM | POA: Diagnosis not present

## 2018-02-18 DIAGNOSIS — E210 Primary hyperparathyroidism: Secondary | ICD-10-CM | POA: Diagnosis not present

## 2018-02-18 DIAGNOSIS — N186 End stage renal disease: Secondary | ICD-10-CM | POA: Insufficient documentation

## 2018-02-18 DIAGNOSIS — E119 Type 2 diabetes mellitus without complications: Secondary | ICD-10-CM | POA: Diagnosis not present

## 2018-02-18 MED ORDER — SULFAMETHOXAZOLE-TRIMETHOPRIM 400-80 MG PO TABS: 1.0000 | ORAL_TABLET | Freq: Every day | ORAL | 0 refills | Status: DC

## 2018-02-18 MED ORDER — DOXYCYCLINE HYCLATE 100 MG PO CAPS: 100.0000 mg | ORAL_CAPSULE | Freq: Two times a day (BID) | ORAL | 0 refills | Status: DC

## 2018-02-18 NOTE — ED Triage Notes (Signed)
Pt reports a boil on the left side of his upper cheek. Pt denies any drainage.

## 2018-02-18 NOTE — ED Provider Notes (Signed)
[facility] EMERGENCY DEPARTMENT Provider Note   CSN: [id number] Arrival date & time: 02/18/18  1919     History   Chief Complaint Chief Complaint  Patient presents with  . Boil on Face    HPI [name] is a 52 y.o. male.  HPI   52 year old male presents today with complaints of facial infection. Patient notes 1 week of developing infection in the left lower cheek. He notes this is similar to previous abscesses in the past, though he's never had one on the face. He denies any fever. He reports he is a diabetic but has well-controlled blood sugars in the low 100 range, noting he did check his blood sugar today.    Past Medical History:  Diagnosis Date  . Amputee, below knee, left ([place])   . Anemia   . Blood transfusion   . Bronchitis   . Chest pain   . CHF (congestive heart failure) ([place])   . Chronic kidney disease    d[facility], [facility]  . Diabetes mellitus    type 2  . Dysrhythmia    irregular heartbeat  . ESRD (end stage renal disease) on dialysis ([place])    first HD 06/19/04  . GERD (gastroesophageal reflux disease)   . Hyperparathyroidism ([place])   . Morbid obesity [facility])     Patient Active Problem List   Diagnosis Date Noted  . Postop check 12/05/2016  . Status post ankle fusion 05/23/2016  . Acute chest pain 09/12/2015  . Chest pain 09/12/2015  . Septic shock ([place])   . MRSA bacteremia   . Esophageal reflux   . Infection and inflammatory reaction due to cardiac device, implant, and graft ([place])   . Diabetes mellitus type 2, uncontrolled ([place]) 04/18/2015  . Status post below knee amputation of left lower extremity ([place]) 04/17/2015  . 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 ESRD on dialysis ([place]) 10/23/2012  . DIASTOLIC DYSFUNCTION 37/62/8315  . PVD 10/22/2007  . ESRD 10/22/2007  . HYPERPARATHYROIDISM, HX OF 10/22/2007    Past Surgical History:  Procedure Laterality Date    . AMPUTATION Left 01/20/2015   Procedure: Left Great Toe Amputation;  Surgeon: Newt Minion, MD;  Location: [place];  Service: Orthopedics;  Laterality: Left;  . AMPUTATION Left 03/07/2015   Procedure: Left Foot 1st and 2nd Ray Amputation;  Surgeon: Newt Minion, MD;  Location: [place];  Service: Orthopedics;  Laterality: Left;  . AMPUTATION Left 04/14/2015   Procedure: LEFT BELOW THE KNEE AMPUTATION ;  Surgeon: Newt Minion, MD;  Location: [place];  Service: Orthopedics;  Laterality: Left;  . ANKLE FUSION     rt foot charcot joint 2008 with refusion  . AV FISTULA PLACEMENT  10/11/2010  . AV FISTULA PLACEMENT  07/02/2011   Procedure: INSERTION OF ARTERIOVENOUS (AV) GORE-TEX GRAFT ARM;  Surgeon: Elam Dutch, MD;  Location: [place];  Service: Vascular;  Laterality: Left;  . AV FISTULA PLACEMENT Right 11/06/2012   Procedure: INSERTION OF ARTERIOVENOUS (AV) GORE-TEX GRAFT ARM;  Surgeon: Mal Misty, MD;  Location: [place];  Service: Vascular;  Laterality: Right;  . AV FISTULA PLACEMENT Right 03/21/2016   Procedure: INSERTION OF ARTERIOVENOUS (AV) GORE-TEX GRAFT ARM ( LOOP FOREARM );  Surgeon: Algernon Huxley, MD;  Location: [facility];  Service: Vascular;  Laterality: Right;  . AV FISTULA PLACEMENT Left 11/07/2016   Procedure: INSERTION OF ARTERIOVENOUS (AV) Artegraft;  Surgeon: Erskine Squibb  Lucky Cowboy, MD;  Location: [facility];  Service: Vascular;  Laterality: Left;  . [place] REMOVAL Right 11/06/2012   Procedure: REMOVAL OF ARTERIOVENOUS GORETEX GRAFT ([place]);  Surgeon: Mal Misty, MD;  Location: [place];  Service: Vascular;  Laterality: Right;  . ENDARTERECTOMY FEMORAL  11/07/2016   Procedure: ENDARTERECTOMY FEMORAL;  Surgeon: Algernon Huxley, MD;  Location: [facility];  Service: Vascular;;  . HERNIA REPAIR     Umbilical Hernia  . INSERTION OF DIALYSIS CATHETER  01/20/2012   Procedure: INSERTION OF DIALYSIS CATHETER;  Surgeon: Serafina Mitchell, MD;  Location: [place];  Service: Vascular;  Laterality: N/A;  insertion left internal  jugular dialysis catheter 23cm  . INSERTION OF DIALYSIS CATHETER Right 11/09/2012   Procedure: INSERTION OF DIALYSIS CATHETER;  Surgeon: Conrad [place], MD;  Location: [place];  Service: Vascular;  Laterality: Right;  Right  Femoral Vein  . INSERTION OF DIALYSIS CATHETER N/A 08/18/2015   Procedure: INSERTION OF DIALYSIS CATHETER RIGHT FEMORAL VEIN;  Surgeon: Mal Misty, MD;  Location: [place];  Service: Cardiovascular;  Laterality: N/A;  . PARATHYROIDECTOMY    . PERIPHERAL VASCULAR CATHETERIZATION Right 04/04/2016   Procedure: A/V Shuntogram/Fistulagram;  Surgeon: Algernon Huxley, MD;  Location: [place] [facility];  Service: Cardiovascular;  Laterality: Right;  . REMOVAL OF A HERO DEVICE Left 08/14/2015   Procedure: REMOVAL OF LEFT ARM HERO DEVICE;  Surgeon: Rosetta Posner, MD;  Location: [place];  Service: Vascular;  Laterality: Left;  . TEE WITHOUT CARDIOVERSION N/A 08/18/2015   Procedure: TRANSESOPHAGEAL ECHOCARDIOGRAM (TEE);  Surgeon: Mal Misty, MD;  Location: [place];  Service: Cardiovascular;  Laterality: N/A;  . TEE WITHOUT CARDIOVERSION N/A 12/21/2015   Procedure: TRANSESOPHAGEAL ECHOCARDIOGRAM (TEE);  Surgeon: Dixie Dials, MD;  Location: [facility]OSCOPY;  Service: Cardiovascular;  Laterality: N/A;  . THROMBECTOMY    . Umbilcial Hernia    . UPPER EXTREMITY VENOGRAPHY Left 10/31/2016   Procedure: Upper Extremity Venography;  Surgeon: Algernon Huxley, MD;  Location: [place] [facility];  Service: Cardiovascular;  Laterality: Left;  Marland Kitchen VENOGRAM N/A 01/06/2014   Procedure: VENOGRAM;  Surgeon: Conrad [place], MD;  Location: [facility] CATH LAB;  Service: Cardiovascular;  Laterality: N/A;        Home Medications    Prior to Admission medications   Medication Sig Start Date End Date Taking? Authorizing Provider  aspirin EC 81 MG EC tablet Take 1 tablet (81 mg total) by mouth daily. 08/19/15   Burns, Arloa Koh, MD  AURYXIA 1 GM 210 MG(Fe) tablet Take 1 tablet by mouth 3 (three) times daily with meals. 10/24/16   [provider]  b complex-vitamin c-folic acid (NEPHRO-VITE) 0.8 MG TABS tablet Take 1 tablet by mouth at bedtime. 04/26/15   Angiulli, Lavon Paganini, PA-C  doxycycline (VIBRAMYCIN) 100 MG capsule Take 1 capsule (100 mg total) by mouth 2 (two) times daily. 02/18/18   Ludene Stokke, Dellis Filbert, PA-C  Dulaglutide (TRULICITY) 6.26 RS/8.5IO SOPN Inject 0.75 mg into the skin every Friday.     [provider]  insulin detemir (LEVEMIR) 100 UNIT/ML injection Inject 0.2 mLs (20 Units total) into the skin at bedtime. Patient taking differently: Inject 5-20 Units into the skin at bedtime. Per sliding scale 04/26/15   Angiulli, Lavon Paganini, PA-C  NOVOLOG FLEXPEN 100 UNIT/ML FlexPen  12/04/16   [provider]  oxyCODONE-acetaminophen (PERCOCET/ROXICET) 5-325 MG tablet Take 1 tablet by mouth every 6 (six) hours as needed for severe pain. 12/03/16  Lawyer, Harrell Gave, PA-C  sulfamethoxazole-trimethoprim (BACTRIM) 400-80 MG tablet Take 1 tablet by mouth daily. 02/18/18   Daivik Overley, Dellis Filbert, PA-C  VENTOLIN HFA 108 [phone] Base) MCG/ACT inhaler  11/06/16   [provider]    Family History Family History  Problem Relation Age of Onset  . Heart disease Mother   . Kidney disease Mother   . Hypertension Mother     Social History Social History   Tobacco Use  . Smoking status: Former Smoker    Years: 20.00    Types: Cigarettes    Last attempt to quit: 01/13/2015    Years since quitting: 3.1  . Smokeless tobacco: Never Used  . Tobacco comment: quit 2 months ago  Substance Use Topics  . Alcohol use: No    Alcohol/week: 0.0 oz  . Drug use: No     Allergies   Hydrocodone   Review of Systems Review of Systems  All other systems reviewed and are negative.    Physical Exam Updated Vital Signs BP (!) 105/51 (BP Location: Right Arm)   Pulse (!) 101   Temp 99.6 F (37.6 C) (Oral)   Resp 18   Ht 6' (1.829 m)   Wt 117.9 kg (260 lb)   SpO2 96%   BMI 35.26 kg/m   Physical Exam  Constitutional:  He is oriented to person, place, and time. He appears well-developed and well-nourished.  HENT:  Head: Normocephalic and atraumatic.  4 cm area of induration to left cheek with minor central skin breakdown, no fluctuance noted, no discharge  Eyes: Pupils are equal, round, and reactive to light. Conjunctivae are normal. Right eye exhibits no discharge. Left eye exhibits no discharge. No scleral icterus.  Neck: Normal range of motion. No JVD present. No tracheal deviation present.  Pulmonary/Chest: Effort normal. No stridor.  Neurological: He is alert and oriented to person, place, and time. Coordination normal.  Psychiatric: He has a normal mood and affect. His behavior is normal. Judgment and thought content normal.  Nursing note and vitals reviewed.   ED Treatments / Results  Labs (all labs ordered are listed, but only abnormal results are displayed) Labs Reviewed - No data to display  EKG None  Radiology No results found.  Procedures Procedures (including critical care time)  EMERGENCY DEPARTMENT US SOFT TISSUE INTERPRETATION "Study: Limited Soft Tissue Ultrasound"  INDICATIONS: Soft tissue infection Multiple views of the body part were obtained in real-time with a multi-frequency linear probe  PERFORMED BY: Myself IMAGES ARCHIVED?: Yes SIDE:Left BODY PART:face INTERPRETATION:  Cellulitis present    Medications Ordered in ED Medications - No data to display   Initial Impression / Assessment and Plan / ED Course  I have reviewed the triage vital signs and the nursing notes.  Pertinent labs & imaging results that were available during my care of the patient were reviewed by me and considered in my medical decision making (see chart for details).     Labs:   Imaging:  Consults:  Therapeutics:  Discharge Meds:  Bactrim  Assessment/Plan: 52 year old male presents today with complaints of cellulitis. Patient has what appears to be a developing abscess on his  face. Bedside ultrasound shows cellulitis with early abscess. I do not feel that I&D would be successful at this point, patient is afebrile. Patient notes he has been taking leftover Bactrim over the last several days, I will continue on this antibiotic. I discussed antibiotic therapy with  ED pharmacist who recommends once daily dosing of the single strength,  taking this after dialysis. Patient will be given prescription for Bactrim, he will follow-up in 36 hours if symptoms are not improving immediately if they worsen.  Patient will continue using insulin at home, is given strict return cautious, he verbalized understanding and agreement to today's plan had no further questions or concerns at the time discharge.    Final Clinical Impressions(s) / ED Diagnoses   Final diagnoses:  Cellulitis of face    ED Discharge Orders        Ordered    doxycycline (VIBRAMYCIN) 100 MG capsule  2 times daily,   Status:  Discontinued     02/18/18 2059    doxycycline (VIBRAMYCIN) 100 MG capsule  2 times daily,   Status:  Discontinued     02/18/18 2102    doxycycline (VIBRAMYCIN) 100 MG capsule  2 times daily     02/18/18 2103    sulfamethoxazole-trimethoprim (BACTRIM) 400-80 MG tablet  Daily,   Status:  Discontinued     02/18/18 2127    sulfamethoxazole-trimethoprim (BACTRIM) 400-80 MG tablet  Daily     02/18/18 2129       Okey Regal, PA-C 02/18/18 2132    Hayden Rasmussen, MD 02/19/18 [phone]

## 2018-02-18 NOTE — Discharge Instructions (Addendum)
Please read the attached information if her symptoms are not improving after 36 hours please return to the emergency room if they worsen return immediately. If you continue to have symptoms at the end of your course of antibiotics please return for repeat evaluation. Please take medication daily, make sure to take the antibiotic after dialysis on dialysis days.

## 2018-02-18 NOTE — ED Notes (Signed)
See EDP assessment 

## 2018-02-18 NOTE — ED Notes (Signed)
Pt verbalizes understanding of d/c instructions. Pt received prescriptions. Pt taken to lobby in wheelchair at d/c with all belongings and with family.

## 2018-02-20 ENCOUNTER — Emergency Department (HOSPITAL_COMMUNITY): Payer: Medicare Other

## 2018-02-20 ENCOUNTER — Other Ambulatory Visit: Payer: Self-pay

## 2018-02-20 ENCOUNTER — Encounter (HOSPITAL_COMMUNITY): Payer: Self-pay | Admitting: *Deleted

## 2018-02-20 ENCOUNTER — Emergency Department (HOSPITAL_COMMUNITY)
Admission: EM | Admit: 2018-02-20 | Discharge: 2018-02-20 | Disposition: A | Discharge status: Home / Self Care | Payer: Medicare Other | Attending: Emergency Medicine | Admitting: Emergency Medicine

## 2018-02-20 DIAGNOSIS — R220 Localized swelling, mass and lump, head: Secondary | ICD-10-CM | POA: Diagnosis present

## 2018-02-20 DIAGNOSIS — E1122 Type 2 diabetes mellitus with diabetic chronic kidney disease: Secondary | ICD-10-CM | POA: Diagnosis not present

## 2018-02-20 DIAGNOSIS — Z79899 Other long term (current) drug therapy: Secondary | ICD-10-CM | POA: Diagnosis not present

## 2018-02-20 DIAGNOSIS — N186 End stage renal disease: Secondary | ICD-10-CM | POA: Diagnosis not present

## 2018-02-20 DIAGNOSIS — L03211 Cellulitis of face: Secondary | ICD-10-CM | POA: Insufficient documentation

## 2018-02-20 DIAGNOSIS — I509 Heart failure, unspecified: Secondary | ICD-10-CM | POA: Diagnosis not present

## 2018-02-20 DIAGNOSIS — Z794 Long term (current) use of insulin: Secondary | ICD-10-CM | POA: Diagnosis not present

## 2018-02-20 DIAGNOSIS — Z87891 Personal history of nicotine dependence: Secondary | ICD-10-CM | POA: Insufficient documentation

## 2018-02-20 DIAGNOSIS — Z992 Dependence on renal dialysis: Secondary | ICD-10-CM | POA: Insufficient documentation

## 2018-02-20 DIAGNOSIS — Z7982 Long term (current) use of aspirin: Secondary | ICD-10-CM | POA: Insufficient documentation

## 2018-02-20 LAB — COMPREHENSIVE METABOLIC PANEL
ALT: 9 U/L (ref 0–44)
AST: 14 U/L — AB (ref 15–41)
Albumin: 3.3 g/dL — ABNORMAL LOW (ref 3.5–5.0)
Alkaline Phosphatase: 63 U/L (ref 38–126)
Anion gap: 15 (ref 5–15)
BILIRUBIN TOTAL: 1 mg/dL (ref 0.3–1.2)
BUN: 41 mg/dL — AB (ref 6–20)
CHLORIDE: 93 mmol/L — AB (ref 98–111)
CO2: 29 mmol/L (ref 22–32)
CREATININE: 12.08 mg/dL — AB (ref 0.61–1.24)
Calcium: 8.5 mg/dL — ABNORMAL LOW (ref 8.9–10.3)
GFR calc Af Amer: 5 mL/min — ABNORMAL LOW (ref >60)
GFR, EST NON AFRICAN AMERICAN: 4 mL/min — AB (ref >60)
Glucose, Bld: 183 mg/dL — ABNORMAL HIGH (ref 70–99)
Potassium: 4.3 mmol/L (ref 3.5–5.1)
Sodium: 137 mmol/L (ref 135–145)
TOTAL PROTEIN: 7 g/dL (ref 6.5–8.1)

## 2018-02-20 LAB — CBC WITH DIFFERENTIAL/PLATELET
Basophils Absolute: 0.1 10*3/uL (ref 0.0–0.1)
Basophils Relative: 1 %
EOS PCT: 3 %
Eosinophils Absolute: 0.1 10*3/uL (ref 0.0–0.7)
HCT: 36.1 % — ABNORMAL LOW (ref 39.0–52.0)
Hemoglobin: 11.1 g/dL — ABNORMAL LOW (ref 13.0–17.0)
Lymphocytes Relative: 16 %
Lymphs Abs: 0.8 10*3/uL (ref 0.7–4.0)
MCH: 30.7 pg (ref 26.0–34.0)
MCHC: 30.7 g/dL (ref 30.0–36.0)
MCV: 100 fL (ref 78.0–100.0)
MONO ABS: 0.9 10*3/uL (ref 0.1–1.0)
Monocytes Relative: 19 %
NEUTROS ABS: 3 10*3/uL (ref 1.7–7.7)
Neutrophils Relative %: 61 %
Platelets: 103 10*3/uL — ABNORMAL LOW (ref 150–400)
RBC: 3.61 MIL/uL — AB (ref 4.22–5.81)
RDW: 13.9 % (ref 11.5–15.5)
WBC: 4.9 10*3/uL (ref 4.0–10.5)

## 2018-02-20 LAB — I-STAT CG4 LACTIC ACID, ED
LACTIC ACID, VENOUS: 1.76 mmol/L (ref 0.5–1.9)
Lactic Acid, Venous: 1.98 mmol/L — ABNORMAL HIGH (ref 0.5–1.9)

## 2018-02-20 MED ORDER — CLINDAMYCIN HCL 150 MG PO CAPS: 300.0000 mg | ORAL_CAPSULE | Freq: Three times a day (TID) | ORAL | 0 refills | Status: AC

## 2018-02-20 NOTE — ED Notes (Signed)
EDP at bedside  

## 2018-02-20 NOTE — ED Provider Notes (Signed)
[facility] EMERGENCY DEPARTMENT Provider Note   CSN: [id number] Arrival date & time: 02/20/18  1338     History   Chief Complaint Chief Complaint  Patient presents with  . Abscess    HPI [name] is a 52 y.o. male.  52 y.o male with a PMH of CHD, GERD and BKA left presents to the ED with a chief complaint of left facial swelling x 2 weeks.Patient was seen in the ED 2 days ago and told to return if symptoms worsen. Patient states the swelling has not improved and he needs some relieve. He has tried warm compresses, boils and oxycodone states no relieve in symptoms.He denies any fever, difficulty speaking, changes in voice, chest pain or shortness of breath.      Past Medical History:  Diagnosis Date  . Amputee, below knee, left ([place])   . Anemia   . Blood transfusion   . Bronchitis   . Chest pain   . CHF (congestive heart failure) ([place])   . Chronic kidney disease    d[facility], [facility]  . Diabetes mellitus    type 2  . Dysrhythmia    irregular heartbeat  . ESRD (end stage renal disease) on dialysis ([place])    first HD 06/19/04  . GERD (gastroesophageal reflux disease)   . Hyperparathyroidism ([place])   . Morbid obesity [facility])     Patient Active Problem List   Diagnosis Date Noted  . Postop check 12/05/2016  . Status post ankle fusion 05/23/2016  . Acute chest pain 09/12/2015  . Chest pain 09/12/2015  . Septic shock ([place])   . MRSA bacteremia   . Esophageal reflux   . Infection and inflammatory reaction due to cardiac device, implant, and graft ([place])   . Diabetes mellitus type 2, uncontrolled ([place]) 04/18/2015  . Status post below knee amputation of left lower extremity ([place]) 04/17/2015  . 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 ESRD on dialysis ([place]) 10/23/2012  . DIASTOLIC DYSFUNCTION 77/82/4235  . PVD 10/22/2007  . ESRD 10/22/2007  . HYPERPARATHYROIDISM, HX OF 10/22/2007     Past Surgical History:  Procedure Laterality Date  . AMPUTATION Left 01/20/2015   Procedure: Left Great Toe Amputation;  Surgeon: Newt Minion, MD;  Location: [place];  Service: Orthopedics;  Laterality: Left;  . AMPUTATION Left 03/07/2015   Procedure: Left Foot 1st and 2nd Ray Amputation;  Surgeon: Newt Minion, MD;  Location: [place];  Service: Orthopedics;  Laterality: Left;  . AMPUTATION Left 04/14/2015   Procedure: LEFT BELOW THE KNEE AMPUTATION ;  Surgeon: Newt Minion, MD;  Location: [place];  Service: Orthopedics;  Laterality: Left;  . ANKLE FUSION     rt foot charcot joint 2008 with refusion  . AV FISTULA PLACEMENT  10/11/2010  . AV FISTULA PLACEMENT  07/02/2011   Procedure: INSERTION OF ARTERIOVENOUS (AV) GORE-TEX GRAFT ARM;  Surgeon: Elam Dutch, MD;  Location: [place];  Service: Vascular;  Laterality: Left;  . AV FISTULA PLACEMENT Right 11/06/2012   Procedure: INSERTION OF ARTERIOVENOUS (AV) GORE-TEX GRAFT ARM;  Surgeon: Mal Misty, MD;  Location: [place];  Service: Vascular;  Laterality: Right;  . AV FISTULA PLACEMENT Right 03/21/2016   Procedure: INSERTION OF ARTERIOVENOUS (AV) GORE-TEX GRAFT ARM ( LOOP FOREARM );  Surgeon: Algernon Huxley, MD;  Location: [facility];  Service: Vascular;  Laterality: Right;  . AV FISTULA PLACEMENT Left 11/07/2016   Procedure:  INSERTION OF ARTERIOVENOUS (AV) Artegraft;  Surgeon: Algernon Huxley, MD;  Location: [facility];  Service: Vascular;  Laterality: Left;  . [place] REMOVAL Right 11/06/2012   Procedure: REMOVAL OF ARTERIOVENOUS GORETEX GRAFT ([place]);  Surgeon: Mal Misty, MD;  Location: [place];  Service: Vascular;  Laterality: Right;  . ENDARTERECTOMY FEMORAL  11/07/2016   Procedure: ENDARTERECTOMY FEMORAL;  Surgeon: Algernon Huxley, MD;  Location: [facility];  Service: Vascular;;  . HERNIA REPAIR     Umbilical Hernia  . INSERTION OF DIALYSIS CATHETER  01/20/2012   Procedure: INSERTION OF DIALYSIS CATHETER;  Surgeon: Serafina Mitchell, MD;  Location: [place];   Service: Vascular;  Laterality: N/A;  insertion left internal jugular dialysis catheter 23cm  . INSERTION OF DIALYSIS CATHETER Right 11/09/2012   Procedure: INSERTION OF DIALYSIS CATHETER;  Surgeon: Conrad [place], MD;  Location: [place];  Service: Vascular;  Laterality: Right;  Right  Femoral Vein  . INSERTION OF DIALYSIS CATHETER N/A 08/18/2015   Procedure: INSERTION OF DIALYSIS CATHETER RIGHT FEMORAL VEIN;  Surgeon: Mal Misty, MD;  Location: [place];  Service: Cardiovascular;  Laterality: N/A;  . PARATHYROIDECTOMY    . PERIPHERAL VASCULAR CATHETERIZATION Right 04/04/2016   Procedure: A/V Shuntogram/Fistulagram;  Surgeon: Algernon Huxley, MD;  Location: [place] [facility];  Service: Cardiovascular;  Laterality: Right;  . REMOVAL OF A HERO DEVICE Left 08/14/2015   Procedure: REMOVAL OF LEFT ARM HERO DEVICE;  Surgeon: Rosetta Posner, MD;  Location: [place];  Service: Vascular;  Laterality: Left;  . TEE WITHOUT CARDIOVERSION N/A 08/18/2015   Procedure: TRANSESOPHAGEAL ECHOCARDIOGRAM (TEE);  Surgeon: Mal Misty, MD;  Location: [place];  Service: Cardiovascular;  Laterality: N/A;  . TEE WITHOUT CARDIOVERSION N/A 12/21/2015   Procedure: TRANSESOPHAGEAL ECHOCARDIOGRAM (TEE);  Surgeon: Dixie Dials, MD;  Location: [facility]OSCOPY;  Service: Cardiovascular;  Laterality: N/A;  . THROMBECTOMY    . Umbilcial Hernia    . UPPER EXTREMITY VENOGRAPHY Left 10/31/2016   Procedure: Upper Extremity Venography;  Surgeon: Algernon Huxley, MD;  Location: [place] CV LAB;  Service: Cardiovascular;  Laterality: Left;  Marland Kitchen VENOGRAM N/A 01/06/2014   Procedure: VENOGRAM;  Surgeon: Conrad Pronghorn, MD;  Location: [facility] CATH LAB;  Service: Cardiovascular;  Laterality: N/A;        Home Medications    Prior to Admission medications   Medication Sig Start Date End Date Taking? Authorizing Provider  aspirin EC 81 MG EC tablet Take 1 tablet (81 mg total) by mouth daily. 08/19/15   Burns, Arloa Koh, MD  AURYXIA 1 GM 210 MG(Fe) tablet Take 1 tablet  by mouth 3 (three) times daily with meals. 10/24/16   [provider]  b complex-vitamin c-folic acid (NEPHRO-VITE) 0.8 MG TABS tablet Take 1 tablet by mouth at bedtime. 04/26/15   Angiulli, Lavon Paganini, PA-C  doxycycline (VIBRAMYCIN) 100 MG capsule Take 1 capsule (100 mg total) by mouth 2 (two) times daily. 02/18/18   Hedges, Dellis Filbert, PA-C  Dulaglutide (TRULICITY) 8.52 DP/8.2UM SOPN Inject 0.75 mg into the skin every Friday.     [provider]  insulin detemir (LEVEMIR) 100 UNIT/ML injection Inject 0.2 mLs (20 Units total) into the skin at bedtime. Patient taking differently: Inject 5-20 Units into the skin at bedtime. Per sliding scale 04/26/15   Angiulli, Lavon Paganini, PA-C  NOVOLOG FLEXPEN 100 UNIT/ML FlexPen  12/04/16   [provider]  oxyCODONE-acetaminophen (PERCOCET/ROXICET) 5-325 MG tablet Take 1 tablet by mouth every 6 (six)  hours as needed for severe pain. 12/03/16   Lawyer, Harrell Gave, PA-C  sulfamethoxazole-trimethoprim (BACTRIM) 400-80 MG tablet Take 1 tablet by mouth daily. 02/18/18   Hedges, Dellis Filbert, PA-C  VENTOLIN HFA 108 [phone] Base) MCG/ACT inhaler  11/06/16   [provider]    Family History Family History  Problem Relation Age of Onset  . Heart disease Mother   . Kidney disease Mother   . Hypertension Mother     Social History Social History   Tobacco Use  . Smoking status: Former Smoker    Years: 20.00    Types: Cigarettes    Last attempt to quit: 01/13/2015    Years since quitting: 3.1  . Smokeless tobacco: Never Used  . Tobacco comment: quit 2 months ago  Substance Use Topics  . Alcohol use: No    Alcohol/week: 0.0 standard drinks  . Drug use: No     Allergies   Hydrocodone   Review of Systems Review of Systems  Constitutional: Negative for chills and fever.  HENT: Negative for ear pain and sore throat.   Eyes: Negative for pain and visual disturbance.  Respiratory: Negative for cough and shortness of breath.    Cardiovascular: Negative for chest pain and palpitations.  Gastrointestinal: Negative for abdominal pain and vomiting.  Genitourinary: Negative for dysuria and hematuria.  Musculoskeletal: Negative for arthralgias and back pain.  Skin: Positive for wound. Negative for color change and rash.  Neurological: Negative for seizures and syncope.  All other systems reviewed and are negative.    Physical Exam Updated Vital Signs BP 118/69   Pulse (!) 59   Temp 99 F (37.2 C) (Oral)   Resp 18   SpO2 (!) 79%   Physical Exam  Constitutional: He is oriented to person, place, and time. He appears well-developed and well-nourished.  HENT:  Head: Normocephalic and atraumatic.    Mouth/Throat: Oropharynx is clear and moist.  7 cm indurated non fluctuant  Eyes: Pupils are equal, round, and reactive to light. No scleral icterus.  Neck: Normal range of motion.  Cardiovascular: Normal heart sounds.  Pulmonary/Chest: Effort normal and breath sounds normal. He has no wheezes. He exhibits no tenderness.  Abdominal: Soft. Bowel sounds are normal. He exhibits no distension. There is no tenderness.  Musculoskeletal: He exhibits no tenderness or deformity.  Neurological: He is alert and oriented to person, place, and time.  Skin: Skin is warm and dry. Capillary refill takes less than 2 seconds.  Nursing note and vitals reviewed.      ED Treatments / Results  Labs (all labs ordered are listed, but only abnormal results are displayed) Labs Reviewed  COMPREHENSIVE METABOLIC PANEL - Abnormal; Notable for the following components:      Result Value   Chloride 93 (*)    Glucose, Bld 183 (*)    BUN 41 (*)    Creatinine, Ser 12.08 (*)    Calcium 8.5 (*)    Albumin 3.3 (*)    AST 14 (*)    GFR calc non Af Amer 4 (*)    GFR calc Af Amer 5 (*)    All other components within normal limits  CBC WITH DIFFERENTIAL/PLATELET - Abnormal; Notable for the following components:   RBC 3.61 (*)     Hemoglobin 11.1 (*)    HCT 36.1 (*)    Platelets 103 (*)    All other components within normal limits  I-STAT CG4 LACTIC ACID, ED - Abnormal; Notable for the following components:  Lactic Acid, Venous 1.98 (*)    All other components within normal limits  URINALYSIS, ROUTINE W REFLEX MICROSCOPIC  I-STAT CG4 LACTIC ACID, ED    EKG None  Radiology Dg Chest 2 View  Result Date: 02/20/2018 CLINICAL DATA:  Infection. EXAM: CHEST - 2 VIEW COMPARISON:  Radiographs of February 11, 2016. FINDINGS: Stable cardiomegaly. Atherosclerosis of thoracic aorta is noted. No pneumothorax or pleural effusion is noted. No acute pulmonary disease is noted. Bony thorax is unremarkable. IMPRESSION: No active cardiopulmonary disease. Aortic Atherosclerosis (ICD10-I70.0). Electronically Signed   By: Marijo Conception, M.D.   On: 02/20/2018 14:59    Procedures Procedures (including critical care time)  Medications Ordered in ED Medications - No data to display   Initial Impression / Assessment and Plan / ED Course  I have reviewed the triage vital signs and the nursing notes.  Pertinent labs & imaging results that were available during my care of the patient were reviewed by me and considered in my medical decision making (see chart for details).     Patient returns to the ED with worsen facial cellulitis.Patient stated he got told to return for drainage if symptoms did not improve. I have attempted an I&D of his left cheek but did not obtain any fluids, or blood from the skin. I have advised patient that at this time we need to try new antibiotics. I have talked to him about giving the antibiotics time to work but continue warm compresses. Patient is understand and agrees with the plan. Return precautions provided.   Final Clinical Impressions(s) / ED Diagnoses   Final diagnoses:  Facial cellulitis    ED Discharge Orders    None       Janeece Fitting, Hershal Coria 02/20/18 1944    Mesner, Corene Cornea, MD 02/21/18  2337

## 2018-02-20 NOTE — ED Triage Notes (Signed)
Pt in c/o abscess to his left upper cheek, seen for same a few days ago and started on antibiotics with no improvment

## 2018-02-20 NOTE — Discharge Instructions (Addendum)
I have prescribed antibiotics, please take as directed.Please do not take previous antibiotics or combine therapy. Please complete therapy for 7 days.

## 2018-06-22 DIAGNOSIS — R519 Headache, unspecified: Secondary | ICD-10-CM | POA: Insufficient documentation

## 2018-08-26 ENCOUNTER — Other Ambulatory Visit (INDEPENDENT_AMBULATORY_CARE_PROVIDER_SITE_OTHER): Payer: Self-pay | Admitting: Vascular Surgery

## 2018-08-26 DIAGNOSIS — N186 End stage renal disease: Secondary | ICD-10-CM

## 2018-08-27 ENCOUNTER — Encounter (INDEPENDENT_AMBULATORY_CARE_PROVIDER_SITE_OTHER): Payer: Medicare Other

## 2018-08-27 ENCOUNTER — Ambulatory Visit (INDEPENDENT_AMBULATORY_CARE_PROVIDER_SITE_OTHER): Payer: Medicare Other | Admitting: Nurse Practitioner

## 2018-08-29 ENCOUNTER — Encounter (HOSPITAL_BASED_OUTPATIENT_CLINIC_OR_DEPARTMENT_OTHER): Payer: Self-pay | Admitting: Emergency Medicine

## 2018-08-29 ENCOUNTER — Other Ambulatory Visit: Payer: Self-pay

## 2018-08-29 ENCOUNTER — Emergency Department (HOSPITAL_BASED_OUTPATIENT_CLINIC_OR_DEPARTMENT_OTHER)
Admission: EM | Admit: 2018-08-29 | Discharge: 2018-08-29 | Disposition: A | Discharge status: Home / Self Care | Payer: Medicare Other | Attending: Emergency Medicine | Admitting: Emergency Medicine

## 2018-08-29 DIAGNOSIS — N186 End stage renal disease: Secondary | ICD-10-CM | POA: Insufficient documentation

## 2018-08-29 DIAGNOSIS — L97921 Non-pressure chronic ulcer of unspecified part of left lower leg limited to breakdown of skin: Secondary | ICD-10-CM | POA: Insufficient documentation

## 2018-08-29 DIAGNOSIS — Z992 Dependence on renal dialysis: Secondary | ICD-10-CM | POA: Insufficient documentation

## 2018-08-29 DIAGNOSIS — I132 Hypertensive heart and chronic kidney disease with heart failure and with stage 5 chronic kidney disease, or end stage renal disease: Secondary | ICD-10-CM | POA: Insufficient documentation

## 2018-08-29 DIAGNOSIS — Z87891 Personal history of nicotine dependence: Secondary | ICD-10-CM | POA: Insufficient documentation

## 2018-08-29 DIAGNOSIS — Z794 Long term (current) use of insulin: Secondary | ICD-10-CM | POA: Insufficient documentation

## 2018-08-29 DIAGNOSIS — L989 Disorder of the skin and subcutaneous tissue, unspecified: Secondary | ICD-10-CM | POA: Diagnosis present

## 2018-08-29 DIAGNOSIS — Z7982 Long term (current) use of aspirin: Secondary | ICD-10-CM | POA: Diagnosis not present

## 2018-08-29 DIAGNOSIS — E1022 Type 1 diabetes mellitus with diabetic chronic kidney disease: Secondary | ICD-10-CM | POA: Diagnosis not present

## 2018-08-29 DIAGNOSIS — I5030 Unspecified diastolic (congestive) heart failure: Secondary | ICD-10-CM | POA: Diagnosis not present

## 2018-08-29 DIAGNOSIS — Z89512 Acquired absence of left leg below knee: Secondary | ICD-10-CM | POA: Diagnosis not present

## 2018-08-29 MED ORDER — SILVER SULFADIAZINE 1 % EX CREA
TOPICAL_CREAM | Freq: Once | CUTANEOUS | Status: AC
  Administered 2018-08-29: 1 via TOPICAL
  Filled 2018-08-29: qty 85

## 2018-08-29 MED ORDER — CLINDAMYCIN HCL 300 MG PO CAPS: 300.0000 mg | ORAL_CAPSULE | Freq: Four times a day (QID) | ORAL | 0 refills | Status: DC

## 2018-08-29 NOTE — Discharge Instructions (Addendum)
Clindamycin as prescribed.  Follow-up with your nephrologist next week at your next dialysis session and have him look at these lesions.  Return to the emergency department in the meantime if your symptoms worsen or change.

## 2018-08-29 NOTE — ED Triage Notes (Signed)
Pt is c/o left leg pain  Pt is a BKA on the left and just had surgery on his leg a couple weeks ago  Pt states he has sharp pain shooting through it

## 2018-08-29 NOTE — ED Provider Notes (Signed)
[facility] EMERGENCY DEPARTMENT Provider Note   CSN: [id number] Arrival date & time: 08/29/18  0024     History   Chief Complaint Chief Complaint  Patient presents with  . Leg Pain    HPI [name] is a 53 y.o. male.  Patient is a 53 year old male with past medical history of type 1 diabetes, end-stage renal disease on hemodialysis for the past 12 years.  He presents today for evaluation of left leg sores.  He has had these for several days and are worsening.  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  He denies any new injury or trauma.  He does report a history of skin sores and has these throughout his body of similar ages.  He has been told that this is related to his dialysis.  The history is provided by the patient.  Leg Pain  Lower extremity pain location: Left thigh. Pain details:    Quality:  Aching   Radiates to:  Does not radiate   Severity:  Moderate   Past Medical History:  Diagnosis Date  . Amputee, below knee, left ([place])   . Anemia   . Blood transfusion   . Bronchitis   . Chest pain   . CHF (congestive heart failure) ([place])   . Chronic kidney disease    d[facility], [facility]  . Diabetes mellitus    type 2  . Dysrhythmia    irregular heartbeat  . ESRD (end stage renal disease) on dialysis ([place])    first HD 06/19/04  . GERD (gastroesophageal reflux disease)   . Hyperparathyroidism ([place])   . Morbid obesity [facility])     Patient Active Problem List   Diagnosis Date Noted  . Postop check 12/05/2016  . Status post ankle fusion 05/23/2016  . Acute chest pain 09/12/2015  . Chest pain 09/12/2015  . Septic shock ([place])   . MRSA bacteremia   . Esophageal reflux   . Infection and inflammatory reaction due to cardiac device, implant, and graft ([place])   . Diabetes mellitus type 2, uncontrolled ([place]) 04/18/2015  . Status post below knee amputation of left lower extremity 04/17/2015  . Atherosclerosis of native arteries  of the extremities with ulceration(440.23) 08/25/2013  . ESRD on dialysis ([place]) 10/23/2012  . DIASTOLIC DYSFUNCTION 01/05/7627  . PVD 10/22/2007  . ESRD 10/22/2007  . HYPERPARATHYROIDISM, HX OF 10/22/2007    Past Surgical History:  Procedure Laterality Date  . AMPUTATION Left 01/20/2015   Procedure: Left Great Toe Amputation;  Surgeon: Newt Minion, MD;  Location: [place];  Service: Orthopedics;  Laterality: Left;  . AMPUTATION Left 03/07/2015   Procedure: Left Foot 1st and 2nd Ray Amputation;  Surgeon: Newt Minion, MD;  Location: [place];  Service: Orthopedics;  Laterality: Left;  . AMPUTATION Left 04/14/2015   Procedure: LEFT BELOW THE KNEE AMPUTATION ;  Surgeon: Newt Minion, MD;  Location: [place];  Service: Orthopedics;  Laterality: Left;  . ANKLE FUSION     rt foot charcot joint 2008 with refusion  . AV FISTULA PLACEMENT  10/11/2010  . AV FISTULA PLACEMENT  07/02/2011   Procedure: INSERTION OF ARTERIOVENOUS (AV) GORE-TEX GRAFT ARM;  Surgeon: Elam Dutch, MD;  Location: [place];  Service: Vascular;  Laterality: Left;  . AV FISTULA PLACEMENT Right 11/06/2012   Procedure: INSERTION OF ARTERIOVENOUS (AV) GORE-TEX GRAFT ARM;  Surgeon: Mal Misty, MD;  Location: [place];  Service: Vascular;  Laterality: Right;  . AV FISTULA PLACEMENT Right 03/21/2016   Procedure: INSERTION OF ARTERIOVENOUS (AV) GORE-TEX GRAFT ARM ( LOOP FOREARM );  Surgeon: Algernon Huxley, MD;  Location: [facility];  Service: Vascular;  Laterality: Right;  . AV FISTULA PLACEMENT Left 11/07/2016   Procedure: INSERTION OF ARTERIOVENOUS (AV) Artegraft;  Surgeon: Algernon Huxley, MD;  Location: [facility];  Service: Vascular;  Laterality: Left;  . [place] REMOVAL Right 11/06/2012   Procedure: REMOVAL OF ARTERIOVENOUS GORETEX GRAFT ([place]);  Surgeon: Mal Misty, MD;  Location: [place];  Service: Vascular;  Laterality: Right;  . ENDARTERECTOMY FEMORAL  11/07/2016   Procedure: ENDARTERECTOMY FEMORAL;  Surgeon: Algernon Huxley, MD;  Location: [facility];   Service: Vascular;;  . HERNIA REPAIR     Umbilical Hernia  . INSERTION OF DIALYSIS CATHETER  01/20/2012   Procedure: INSERTION OF DIALYSIS CATHETER;  Surgeon: Serafina Mitchell, MD;  Location: [place];  Service: Vascular;  Laterality: N/A;  insertion left internal jugular dialysis catheter 23cm  . INSERTION OF DIALYSIS CATHETER Right 11/09/2012   Procedure: INSERTION OF DIALYSIS CATHETER;  Surgeon: Conrad Greene, MD;  Location: [place];  Service: Vascular;  Laterality: Right;  Right  Femoral Vein  . INSERTION OF DIALYSIS CATHETER N/A 08/18/2015   Procedure: INSERTION OF DIALYSIS CATHETER RIGHT FEMORAL VEIN;  Surgeon: Mal Misty, MD;  Location: [place];  Service: Cardiovascular;  Laterality: N/A;  . PARATHYROIDECTOMY    . PERIPHERAL VASCULAR CATHETERIZATION Right 04/04/2016   Procedure: A/V Shuntogram/Fistulagram;  Surgeon: Algernon Huxley, MD;  Location: [place] CV LAB;  Service: Cardiovascular;  Laterality: Right;  . REMOVAL OF A HERO DEVICE Left 08/14/2015   Procedure: REMOVAL OF LEFT ARM HERO DEVICE;  Surgeon: Rosetta Posner, MD;  Location: [place];  Service: Vascular;  Laterality: Left;  . TEE WITHOUT CARDIOVERSION N/A 08/18/2015   Procedure: TRANSESOPHAGEAL ECHOCARDIOGRAM (TEE);  Surgeon: Mal Misty, MD;  Location: [place];  Service: Cardiovascular;  Laterality: N/A;  . TEE WITHOUT CARDIOVERSION N/A 12/21/2015   Procedure: TRANSESOPHAGEAL ECHOCARDIOGRAM (TEE);  Surgeon: Dixie Dials, MD;  Location: [facility];  Service: Cardiovascular;  Laterality: N/A;  . THROMBECTOMY    . Umbilcial Hernia    . UPPER EXTREMITY VENOGRAPHY Left 10/31/2016   Procedure: Upper Extremity Venography;  Surgeon: Algernon Huxley, MD;  Location: [place] CV [facility];  Service: Cardiovascular;  Laterality: Left;  Marland Kitchen VENOGRAM N/A 01/06/2014   Procedure: VENOGRAM;  Surgeon: Conrad Skykomish, MD;  Location: [facility] CATH LAB;  Service: Cardiovascular;  Laterality: N/A;        Home Medications    Prior to Admission medications   Medication  Sig Start Date End Date Taking? Authorizing Provider  aspirin EC 81 MG EC tablet Take 1 tablet (81 mg total) by mouth daily. 08/19/15   Burns, Arloa Koh, MD  AURYXIA 1 GM 210 MG(Fe) tablet Take 1 tablet by mouth 3 (three) times daily with meals. 10/24/16   [provider]  b complex-vitamin c-folic acid (NEPHRO-VITE) 0.8 MG TABS tablet Take 1 tablet by mouth at bedtime. 04/26/15   Angiulli, Lavon Paganini, PA-C  Dulaglutide (TRULICITY) 3.32 RJ/1.8AC SOPN Inject 0.75 mg into the skin every Friday.     [provider]  insulin detemir (LEVEMIR) 100 UNIT/ML injection Inject 0.2 mLs (20 Units total) into the skin at bedtime. Patient taking differently: Inject 5-20 Units into the skin at bedtime. Per sliding scale 04/26/15   Angiulli, Lavon Paganini, PA-C  NOVOLOG  FLEXPEN 100 UNIT/ML FlexPen  12/04/16   [provider]  oxyCODONE-acetaminophen (PERCOCET/ROXICET) 5-325 MG tablet Take 1 tablet by mouth every 6 (six) hours as needed for severe pain. 12/03/16   Lawyer, Harrell Gave, PA-C  VENTOLIN HFA [phone] Base) MCG/ACT inhaler  11/06/16   [provider]    Family History Family History  Problem Relation Age of Onset  . Heart disease Mother   . Kidney disease Mother   . Hypertension Mother     Social History Social History   Tobacco Use  . Smoking status: Former Smoker    Years: 20.00    Types: Cigarettes    Last attempt to quit: 01/13/2015    Years since quitting: 3.6  . Smokeless tobacco: Never Used  . Tobacco comment: quit 2 months ago  Substance Use Topics  . Alcohol use: No    Alcohol/week: 0.0 standard drinks  . Drug use: No     Allergies   Hydrocodone   Review of Systems Review of Systems  All other systems reviewed and are negative.    Physical Exam Updated Vital Signs BP 132/87 (BP Location: Left Arm)   Pulse 81   Temp 97.6 F (36.4 C) (Oral)   Resp 18   Ht 6' (1.829 m)   Wt 108.9 kg   SpO2 98%   BMI 32.55 kg/m   Physical Exam Vitals  signs and nursing note reviewed.  Constitutional:      Appearance: Normal appearance.  HENT:     Head: Normocephalic and atraumatic.  Pulmonary:     Effort: Pulmonary effort is normal.  Skin:    Comments: There are several open sores to the anterior aspect of the left thigh.  There is mild surrounding erythema, however no purulent drainage.  Neurological:     Mental Status: He is alert.      ED Treatments / Results  Labs (all labs ordered are listed, but only abnormal results are displayed) Labs Reviewed - No data to display  EKG None  Radiology No results found.  Procedures Procedures (including critical care time)  Medications Ordered in ED Medications  silver sulfADIAZINE (SILVADENE) 1 % cream (has no administration in time range)     Initial Impression / Assessment and Plan / ED Course  I have reviewed the triage vital signs and the nursing notes.  Pertinent labs & imaging results that were available during my care of the patient were reviewed by me and considered in my medical decision making (see chart for details).  Patient with history of dialysis and type 1 diabetes presenting with sores to his left thigh.  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  He has multiple similar sores throughout his body of different ages.  Patient will be treated with antibiotics.  He is requesting Silvadene as he has been given this in the past.  Patient is to have his nephrologist follow this up at his next dialysis session.  Final Clinical Impressions(s) / ED Diagnoses   Final diagnoses:  None    ED Discharge Orders    None       Veryl Speak, MD 08/29/18 [phone]

## 2018-09-03 ENCOUNTER — Ambulatory Visit (INDEPENDENT_AMBULATORY_CARE_PROVIDER_SITE_OTHER): Payer: Medicare Other | Admitting: Nurse Practitioner

## 2018-09-03 ENCOUNTER — Encounter (INDEPENDENT_AMBULATORY_CARE_PROVIDER_SITE_OTHER): Payer: Medicare Other

## 2018-09-11 ENCOUNTER — Other Ambulatory Visit: Payer: Self-pay

## 2018-09-11 ENCOUNTER — Emergency Department (HOSPITAL_COMMUNITY)
Admission: EM | Admit: 2018-09-11 | Discharge: 2018-09-11 | Disposition: A | Discharge status: Home / Self Care | Payer: Medicare Other | Attending: Emergency Medicine | Admitting: Emergency Medicine

## 2018-09-11 ENCOUNTER — Encounter (HOSPITAL_COMMUNITY): Payer: Self-pay

## 2018-09-11 DIAGNOSIS — N186 End stage renal disease: Secondary | ICD-10-CM | POA: Insufficient documentation

## 2018-09-11 DIAGNOSIS — Z87891 Personal history of nicotine dependence: Secondary | ICD-10-CM | POA: Diagnosis not present

## 2018-09-11 DIAGNOSIS — E119 Type 2 diabetes mellitus without complications: Secondary | ICD-10-CM | POA: Insufficient documentation

## 2018-09-11 DIAGNOSIS — I509 Heart failure, unspecified: Secondary | ICD-10-CM | POA: Insufficient documentation

## 2018-09-11 DIAGNOSIS — R1110 Vomiting, unspecified: Secondary | ICD-10-CM

## 2018-09-11 DIAGNOSIS — Z992 Dependence on renal dialysis: Secondary | ICD-10-CM | POA: Insufficient documentation

## 2018-09-11 DIAGNOSIS — Z7982 Long term (current) use of aspirin: Secondary | ICD-10-CM | POA: Insufficient documentation

## 2018-09-11 DIAGNOSIS — Z79899 Other long term (current) drug therapy: Secondary | ICD-10-CM | POA: Diagnosis not present

## 2018-09-11 DIAGNOSIS — R110 Nausea: Secondary | ICD-10-CM | POA: Diagnosis present

## 2018-09-11 LAB — CBC WITH DIFFERENTIAL/PLATELET
ABS IMMATURE GRANULOCYTES: 0.03 10*3/uL (ref 0.00–0.07)
BASOS PCT: 0 %
Basophils Absolute: 0 10*3/uL (ref 0.0–0.1)
Eosinophils Absolute: 0.1 10*3/uL (ref 0.0–0.5)
Eosinophils Relative: 2 %
HEMATOCRIT: 38.1 % — AB (ref 39.0–52.0)
Hemoglobin: 11.4 g/dL — ABNORMAL LOW (ref 13.0–17.0)
IMMATURE GRANULOCYTES: 0 %
LYMPHS ABS: 0.9 10*3/uL (ref 0.7–4.0)
Lymphocytes Relative: 12 %
MCH: 28.5 pg (ref 26.0–34.0)
MCHC: 29.9 g/dL — ABNORMAL LOW (ref 30.0–36.0)
MCV: 95.3 fL (ref 80.0–100.0)
MONO ABS: 0.7 10*3/uL (ref 0.1–1.0)
MONOS PCT: 10 %
NEUTROS ABS: 5.7 10*3/uL (ref 1.7–7.7)
NEUTROS PCT: 76 %
Platelets: 182 10*3/uL (ref 150–400)
RBC: 4 MIL/uL — ABNORMAL LOW (ref 4.22–5.81)
RDW: 15.5 % (ref 11.5–15.5)
WBC: 7.5 10*3/uL (ref 4.0–10.5)
nRBC: 0 % (ref 0.0–0.2)

## 2018-09-11 LAB — BASIC METABOLIC PANEL
Anion gap: 22 — ABNORMAL HIGH (ref 5–15)
BUN: 17 mg/dL (ref 6–20)
CALCIUM: 9.3 mg/dL (ref 8.9–10.3)
CO2: 31 mmol/L (ref 22–32)
Chloride: 88 mmol/L — ABNORMAL LOW (ref 98–111)
Creatinine, Ser: 8.19 mg/dL — ABNORMAL HIGH (ref 0.61–1.24)
GFR calc Af Amer: 8 mL/min — ABNORMAL LOW (ref >60)
GFR, EST NON AFRICAN AMERICAN: 7 mL/min — AB (ref >60)
Glucose, Bld: 116 mg/dL — ABNORMAL HIGH (ref 70–99)
POTASSIUM: 3.3 mmol/L — AB (ref 3.5–5.1)
Sodium: 141 mmol/L (ref 135–145)

## 2018-09-11 LAB — CBG MONITORING, ED: Glucose-Capillary: 130 mg/dL — ABNORMAL HIGH (ref 70–99)

## 2018-09-11 MED ORDER — OXYCODONE-ACETAMINOPHEN 5-325 MG PO TABS
1.0000 | ORAL_TABLET | Freq: Once | ORAL | Status: AC
  Administered 2018-09-11: 1 via ORAL
  Filled 2018-09-11: qty 1

## 2018-09-11 MED ORDER — METOCLOPRAMIDE HCL 5 MG/ML IJ SOLN
5.0000 mg | Freq: Once | INTRAMUSCULAR | Status: AC
  Administered 2018-09-11: 5 mg via INTRAVENOUS
  Filled 2018-09-11: qty 2

## 2018-09-11 NOTE — ED Notes (Signed)
2 attempts at IV with no success.

## 2018-09-11 NOTE — ED Provider Notes (Signed)
[facility] EMERGENCY DEPARTMENT Provider Note   CSN: [id number] Arrival date & time: 09/11/18  1338    History   Chief Complaint Chief Complaint  Patient presents with  . Nausea  . Emesis    HPI [name] is a 53 y.o. male with past medical history of ESRD on dialysis MWF, CHF, status post left BKA, diabetes, presenting to the emergency department after dialysis appointment with nausea, vomiting, and diarrhea that began earlier today.  Patient states he has had 3 episodes of nonbloody nonbilious emesis, as well as 3 episodes of loose brown stool.  He has not treated symptoms prior to arrival.  No associate abdominal pain, fever, or other complaints.  He does not currently make urine.  He states he completed his entire dialysis appointment.  Did have some mild symptoms yesterday, however returned today.  No sick contacts.    The history is provided by the patient.    Past Medical History:  Diagnosis Date  . Amputee, below knee, left ([place])   . Anemia   . Blood transfusion   . Bronchitis   . Chest pain   . CHF (congestive heart failure) ([place])   . Chronic kidney disease    d[facility], [facility]  . Diabetes mellitus    type 2  . Dysrhythmia    irregular heartbeat  . ESRD (end stage renal disease) on dialysis ([place])    first HD 06/19/04  . GERD (gastroesophageal reflux disease)   . Hyperparathyroidism ([place])   . Morbid obesity [facility])     Patient Active Problem List   Diagnosis Date Noted  . Postop check 12/05/2016  . Status post ankle fusion 05/23/2016  . Acute chest pain 09/12/2015  . Chest pain 09/12/2015  . Septic shock ([place])   . MRSA bacteremia   . Esophageal reflux   . Infection and inflammatory reaction due to cardiac device, implant, and graft ([place])   . Diabetes mellitus type 2, uncontrolled ([place]) 04/18/2015  . Status post below knee amputation of left lower extremity 04/17/2015  . Atherosclerosis of native arteries of the  extremities with ulceration(440.23) 08/25/2013  . ESRD on dialysis ([place]) 10/23/2012  . DIASTOLIC DYSFUNCTION 07/37/1062  . PVD 10/22/2007  . ESRD 10/22/2007  . HYPERPARATHYROIDISM, HX OF 10/22/2007    Past Surgical History:  Procedure Laterality Date  . AMPUTATION Left 01/20/2015   Procedure: Left Great Toe Amputation;  Surgeon: Newt Minion, MD;  Location: [place];  Service: Orthopedics;  Laterality: Left;  . AMPUTATION Left 03/07/2015   Procedure: Left Foot 1st and 2nd Ray Amputation;  Surgeon: Newt Minion, MD;  Location: [place];  Service: Orthopedics;  Laterality: Left;  . AMPUTATION Left 04/14/2015   Procedure: LEFT BELOW THE KNEE AMPUTATION ;  Surgeon: Newt Minion, MD;  Location: [place];  Service: Orthopedics;  Laterality: Left;  . ANKLE FUSION     rt foot charcot joint 2008 with refusion  . AV FISTULA PLACEMENT  10/11/2010  . AV FISTULA PLACEMENT  07/02/2011   Procedure: INSERTION OF ARTERIOVENOUS (AV) GORE-TEX GRAFT ARM;  Surgeon: Elam Dutch, MD;  Location: [place];  Service: Vascular;  Laterality: Left;  . AV FISTULA PLACEMENT Right 11/06/2012   Procedure: INSERTION OF ARTERIOVENOUS (AV) GORE-TEX GRAFT ARM;  Surgeon: Mal Misty, MD;  Location: [place];  Service: Vascular;  Laterality: Right;  . AV FISTULA PLACEMENT Right 03/21/2016   Procedure: INSERTION OF ARTERIOVENOUS (AV) GORE-TEX GRAFT ARM ( LOOP  FOREARM );  Surgeon: Algernon Huxley, MD;  Location: [facility];  Service: Vascular;  Laterality: Right;  . AV FISTULA PLACEMENT Left 11/07/2016   Procedure: INSERTION OF ARTERIOVENOUS (AV) Artegraft;  Surgeon: Algernon Huxley, MD;  Location: [facility];  Service: Vascular;  Laterality: Left;  . [place] REMOVAL Right 11/06/2012   Procedure: REMOVAL OF ARTERIOVENOUS GORETEX GRAFT ([place]);  Surgeon: Mal Misty, MD;  Location: [place];  Service: Vascular;  Laterality: Right;  . ENDARTERECTOMY FEMORAL  11/07/2016   Procedure: ENDARTERECTOMY FEMORAL;  Surgeon: Algernon Huxley, MD;  Location: [facility];   Service: Vascular;;  . HERNIA REPAIR     Umbilical Hernia  . INSERTION OF DIALYSIS CATHETER  01/20/2012   Procedure: INSERTION OF DIALYSIS CATHETER;  Surgeon: Serafina Mitchell, MD;  Location: [place];  Service: Vascular;  Laterality: N/A;  insertion left internal jugular dialysis catheter 23cm  . INSERTION OF DIALYSIS CATHETER Right 11/09/2012   Procedure: INSERTION OF DIALYSIS CATHETER;  Surgeon: Conrad [place], MD;  Location: [place];  Service: Vascular;  Laterality: Right;  Right  Femoral Vein  . INSERTION OF DIALYSIS CATHETER N/A 08/18/2015   Procedure: INSERTION OF DIALYSIS CATHETER RIGHT FEMORAL VEIN;  Surgeon: Mal Misty, MD;  Location: [place];  Service: Cardiovascular;  Laterality: N/A;  . PARATHYROIDECTOMY    . PERIPHERAL VASCULAR CATHETERIZATION Right 04/04/2016   Procedure: A/V Shuntogram/Fistulagram;  Surgeon: Algernon Huxley, MD;  Location: [place] CV LAB;  Service: Cardiovascular;  Laterality: Right;  . REMOVAL OF A HERO DEVICE Left 08/14/2015   Procedure: REMOVAL OF LEFT ARM HERO DEVICE;  Surgeon: Rosetta Posner, MD;  Location: [place];  Service: Vascular;  Laterality: Left;  . TEE WITHOUT CARDIOVERSION N/A 08/18/2015   Procedure: TRANSESOPHAGEAL ECHOCARDIOGRAM (TEE);  Surgeon: Mal Misty, MD;  Location: [place];  Service: Cardiovascular;  Laterality: N/A;  . TEE WITHOUT CARDIOVERSION N/A 12/21/2015   Procedure: TRANSESOPHAGEAL ECHOCARDIOGRAM (TEE);  Surgeon: Dixie Dials, MD;  Location: [facility]COPY;  Service: Cardiovascular;  Laterality: N/A;  . THROMBECTOMY    . Umbilcial Hernia    . UPPER EXTREMITY VENOGRAPHY Left 10/31/2016   Procedure: Upper Extremity Venography;  Surgeon: Algernon Huxley, MD;  Location: [place] CV LAB;  Service: Cardiovascular;  Laterality: Left;  Marland Kitchen VENOGRAM N/A 01/06/2014   Procedure: VENOGRAM;  Surgeon: Conrad Bushyhead, MD;  Location: [facility] CATH LAB;  Service: Cardiovascular;  Laterality: N/A;        Home Medications    Prior to Admission medications   Medication  Sig Start Date End Date Taking? Authorizing Provider  aspirin EC 81 MG EC tablet Take 1 tablet (81 mg total) by mouth daily. 08/19/15   Burns, Arloa Koh, MD  AURYXIA 1 GM 210 MG(Fe) tablet Take 1 tablet by mouth 3 (three) times daily with meals. 10/24/16   [provider]  b complex-vitamin c-folic acid (NEPHRO-VITE) 0.8 MG TABS tablet Take 1 tablet by mouth at bedtime. 04/26/15   Angiulli, Lavon Paganini, PA-C  clindamycin (CLEOCIN) 300 MG capsule Take 1 capsule (300 mg total) by mouth 4 (four) times daily. X 7 days 08/29/18   Veryl Speak, MD  Dulaglutide (TRULICITY) 9.92 EQ/6.8TM SOPN Inject 0.75 mg into the skin every Friday.     [provider]  insulin detemir (LEVEMIR) 100 UNIT/ML injection Inject 0.2 mLs (20 Units total) into the skin at bedtime. Patient taking differently: Inject 5-20 Units into the skin at bedtime. Per sliding scale 04/26/15  Cathlyn Parsons, PA-C  NOVOLOG FLEXPEN 100 UNIT/ML FlexPen  12/04/16   [provider]  oxyCODONE-acetaminophen (PERCOCET/ROXICET) 5-325 MG tablet Take 1 tablet by mouth every 6 (six) hours as needed for severe pain. 12/03/16   Lawyer, Harrell Gave, PA-C  VENTOLIN HFA 108 [phone] Base) MCG/ACT inhaler  11/06/16   [provider]    Family History Family History  Problem Relation Age of Onset  . Heart disease Mother   . Kidney disease Mother   . Hypertension Mother     Social History Social History   Tobacco Use  . Smoking status: Former Smoker    Years: 20.00    Types: Cigarettes    Last attempt to quit: 01/13/2015    Years since quitting: 3.6  . Smokeless tobacco: Never Used  . Tobacco comment: quit 2 months ago  Substance Use Topics  . Alcohol use: No    Alcohol/week: 0.0 standard drinks  . Drug use: No     Allergies   Hydrocodone   Review of Systems Review of Systems  Gastrointestinal: Positive for diarrhea, nausea and vomiting.  All other systems reviewed and are negative.    Physical  Exam Updated Vital Signs BP (!) 156/105   Pulse 100   Temp 98.3 F (36.8 C) (Oral)   Resp 20   Ht 6' (1.829 m)   Wt 108.9 kg   SpO2 97%   BMI 32.55 kg/m   Physical Exam Vitals signs and nursing note reviewed.  Constitutional:      General: He is not in acute distress.    Appearance: He is well-developed.  HENT:     Head: Normocephalic and atraumatic.     Mouth/Throat:     Mouth: Mucous membranes are moist.     Pharynx: Oropharynx is clear.  Eyes:     Conjunctiva/sclera: Conjunctivae normal.  Cardiovascular:     Rate and Rhythm: Normal rate and regular rhythm.  Pulmonary:     Effort: Pulmonary effort is normal. No respiratory distress.     Breath sounds: Normal breath sounds.  Abdominal:     General: Bowel sounds are normal. There is no distension.     Palpations: Abdomen is soft.     Tenderness: There is no abdominal tenderness. There is no guarding or rebound.  Musculoskeletal:     Comments: Status post left BKA  Skin:    General: Skin is warm.  Neurological:     Mental Status: He is alert.  Psychiatric:        Behavior: Behavior normal.      ED Treatments / Results  Labs (all labs ordered are listed, but only abnormal results are displayed) Labs Reviewed  CBG MONITORING, ED - Abnormal; Notable for the following components:      Result Value   Glucose-Capillary 130 (*)    All other components within normal limits  BASIC METABOLIC PANEL  CBC WITH DIFFERENTIAL/PLATELET    EKG EKG Interpretation  Date/Time:  Friday September 11 2018 15:07:52 EST Ventricular Rate:  101 PR Interval:    QRS Duration: 92 QT Interval:  394 QTC Calculation: 511 R Axis:   125 Text Interpretation:  Right and left arm electrode reversal, interpretation assumes no reversal Sinus tachycardia Right atrial enlargement Probable lateral infarct, age indeterminate Prolonged QT interval besides arm lead reversal, is similar to April 2018 Confirmed by Sherwood Gambler [phone]) on  09/11/2018 3:16:02 PM   Radiology No results found.  Procedures Procedures (including critical care time)  Medications Ordered  in ED Medications  metoCLOPramide (REGLAN) injection 5 mg (has no administration in time range)     Initial Impression / Assessment and Plan / ED Course  I have reviewed the triage vital signs and the nursing notes.  Pertinent labs & imaging results that were available during my care of the patient were reviewed by me and considered in my medical decision making (see chart for details).       Patient presenting with a few episodes of vomiting and diarrhea that began today.  Did complete his dialysis today prior to arrival.  No known sick contacts.  On evaluation, vital signs are stable, slightly hypertensive though afebrile.  Abdomen is benign, soft and nontender.  Will obtain labs, CBG, and EKG.  CBG 130.  EKG with prolonged QT.  Discussed with Dr. Regenia Skeeter.  Nausea treated with Reglan.  Low suspicion for acute intra-abdominal pathology given benign exam and presentation.  Care assumed at shift change by Dr. Magdalene Molly, pending lab results and reevaluation for appropriate disposition.  Final Clinical Impressions(s) / ED Diagnoses   Final diagnoses:  None    ED Discharge Orders    None       Mariaelena Cade, [place] N, PA-C 09/11/18 1614    Sherwood Gambler, MD 09/12/18 [phone]

## 2018-09-11 NOTE — ED Notes (Signed)
Patient verbalizes understanding of discharge instructions. Opportunity for questioning and answers were provided. Armband removed by staff, pt discharged from ED in wheelchair.  

## 2018-09-11 NOTE — ED Triage Notes (Signed)
Pt stated he has been throwing up since yesterday & while in his scheduled dialysis today he began feeling nauseated & once he began coughing he vomited. He said he feels "bad" so he decided to come to the ED.

## 2018-09-11 NOTE — ED Provider Notes (Signed)
Transfer of care occurred at approximately 330 from outgoing provider.  Plan of care at this time is wait on laboratory studies to assess hydration status and electrolytes after nausea and vomiting.  Patient was at dialysis and finished his management and full treatment however started having nausea and vomiting transferred here for evaluation.  Patient given Reglan due to prolonged QT per prior team.  Patient hemodynamically stable on arrival, laboratory studies at baseline, nausea vomiting resolved.  No indication for further imaging or admission at this time.  Patient discharged stable additional stable signs.  The above care was discussed and agreed upon by my attending physician.   Orson Aloe, MD 09/11/18 Loretha Stapler    Merrily Pew, MD 09/11/18 [phone]

## 2018-09-24 ENCOUNTER — Encounter (INDEPENDENT_AMBULATORY_CARE_PROVIDER_SITE_OTHER): Payer: Self-pay | Admitting: Nurse Practitioner

## 2018-09-24 ENCOUNTER — Ambulatory Visit (INDEPENDENT_AMBULATORY_CARE_PROVIDER_SITE_OTHER): Payer: Medicare Other | Admitting: Nurse Practitioner

## 2018-09-24 ENCOUNTER — Ambulatory Visit (INDEPENDENT_AMBULATORY_CARE_PROVIDER_SITE_OTHER): Payer: Medicare Other

## 2018-09-24 ENCOUNTER — Other Ambulatory Visit: Payer: Self-pay

## 2018-09-24 VITALS — BP 111/70 | HR 103 | Resp 12 | Ht 73.0 in | Wt 197.0 lb

## 2018-09-24 DIAGNOSIS — Z992 Dependence on renal dialysis: Secondary | ICD-10-CM

## 2018-09-24 DIAGNOSIS — E1165 Type 2 diabetes mellitus with hyperglycemia: Secondary | ICD-10-CM

## 2018-09-24 DIAGNOSIS — Z79899 Other long term (current) drug therapy: Secondary | ICD-10-CM

## 2018-09-24 DIAGNOSIS — F17210 Nicotine dependence, cigarettes, uncomplicated: Secondary | ICD-10-CM

## 2018-09-24 DIAGNOSIS — K219 Gastro-esophageal reflux disease without esophagitis: Secondary | ICD-10-CM

## 2018-09-24 DIAGNOSIS — N186 End stage renal disease: Secondary | ICD-10-CM

## 2018-09-25 ENCOUNTER — Encounter (INDEPENDENT_AMBULATORY_CARE_PROVIDER_SITE_OTHER): Payer: Self-pay

## 2018-09-28 ENCOUNTER — Telehealth (INDEPENDENT_AMBULATORY_CARE_PROVIDER_SITE_OTHER): Payer: Self-pay | Admitting: Vascular Surgery

## 2018-09-28 ENCOUNTER — Encounter: Payer: Self-pay | Admitting: Intensive Care

## 2018-09-28 ENCOUNTER — Other Ambulatory Visit: Payer: Self-pay

## 2018-09-28 ENCOUNTER — Encounter (INDEPENDENT_AMBULATORY_CARE_PROVIDER_SITE_OTHER): Payer: Self-pay | Admitting: Nurse Practitioner

## 2018-09-28 ENCOUNTER — Inpatient Hospital Stay
Admission: EM | Admit: 2018-09-28 | Discharge: 2018-10-05 | DRG: 270 | Disposition: A | Discharge status: Home Health | Payer: Medicare Other | Attending: Internal Medicine | Admitting: Internal Medicine

## 2018-09-28 DIAGNOSIS — Z8249 Family history of ischemic heart disease and other diseases of the circulatory system: Secondary | ICD-10-CM

## 2018-09-28 DIAGNOSIS — E1151 Type 2 diabetes mellitus with diabetic peripheral angiopathy without gangrene: Secondary | ICD-10-CM | POA: Diagnosis present

## 2018-09-28 DIAGNOSIS — E872 Acidosis: Secondary | ICD-10-CM | POA: Diagnosis present

## 2018-09-28 DIAGNOSIS — N186 End stage renal disease: Secondary | ICD-10-CM | POA: Diagnosis present

## 2018-09-28 DIAGNOSIS — E8729 Other acidosis: Secondary | ICD-10-CM

## 2018-09-28 DIAGNOSIS — T827XXA Infection and inflammatory reaction due to other cardiac and vascular devices, implants and grafts, initial encounter: Secondary | ICD-10-CM

## 2018-09-28 DIAGNOSIS — F17210 Nicotine dependence, cigarettes, uncomplicated: Secondary | ICD-10-CM | POA: Diagnosis present

## 2018-09-28 DIAGNOSIS — Z6826 Body mass index (BMI) 26.0-26.9, adult: Secondary | ICD-10-CM

## 2018-09-28 DIAGNOSIS — E11610 Type 2 diabetes mellitus with diabetic neuropathic arthropathy: Secondary | ICD-10-CM | POA: Diagnosis present

## 2018-09-28 DIAGNOSIS — Z7982 Long term (current) use of aspirin: Secondary | ICD-10-CM | POA: Diagnosis not present

## 2018-09-28 DIAGNOSIS — Z992 Dependence on renal dialysis: Secondary | ICD-10-CM

## 2018-09-28 DIAGNOSIS — E669 Obesity, unspecified: Secondary | ICD-10-CM | POA: Diagnosis present

## 2018-09-28 DIAGNOSIS — Z794 Long term (current) use of insulin: Secondary | ICD-10-CM | POA: Diagnosis not present

## 2018-09-28 DIAGNOSIS — T82868A Thrombosis of vascular prosthetic devices, implants and grafts, initial encounter: Secondary | ICD-10-CM | POA: Diagnosis present

## 2018-09-28 DIAGNOSIS — Z89512 Acquired absence of left leg below knee: Secondary | ICD-10-CM | POA: Diagnosis not present

## 2018-09-28 DIAGNOSIS — D631 Anemia in chronic kidney disease: Secondary | ICD-10-CM | POA: Diagnosis present

## 2018-09-28 DIAGNOSIS — I132 Hypertensive heart and chronic kidney disease with heart failure and with stage 5 chronic kidney disease, or end stage renal disease: Secondary | ICD-10-CM | POA: Diagnosis present

## 2018-09-28 DIAGNOSIS — E1122 Type 2 diabetes mellitus with diabetic chronic kidney disease: Secondary | ICD-10-CM | POA: Diagnosis present

## 2018-09-28 DIAGNOSIS — E213 Hyperparathyroidism, unspecified: Secondary | ICD-10-CM | POA: Diagnosis present

## 2018-09-28 DIAGNOSIS — Y832 Surgical operation with anastomosis, bypass or graft as the cause of abnormal reaction of the patient, or of later complication, without mention of misadventure at the time of the procedure: Secondary | ICD-10-CM | POA: Diagnosis present

## 2018-09-28 DIAGNOSIS — Y712 Prosthetic and other implants, materials and accessory cardiovascular devices associated with adverse incidents: Secondary | ICD-10-CM | POA: Diagnosis present

## 2018-09-28 DIAGNOSIS — N2581 Secondary hyperparathyroidism of renal origin: Secondary | ICD-10-CM | POA: Diagnosis present

## 2018-09-28 DIAGNOSIS — T82848A Pain from vascular prosthetic devices, implants and grafts, initial encounter: Secondary | ICD-10-CM | POA: Diagnosis not present

## 2018-09-28 DIAGNOSIS — K219 Gastro-esophageal reflux disease without esophagitis: Secondary | ICD-10-CM | POA: Diagnosis present

## 2018-09-28 DIAGNOSIS — Z841 Family history of disorders of kidney and ureter: Secondary | ICD-10-CM

## 2018-09-28 DIAGNOSIS — Z79891 Long term (current) use of opiate analgesic: Secondary | ICD-10-CM

## 2018-09-28 DIAGNOSIS — T82312A Breakdown (mechanical) of femoral arterial graft (bypass), initial encounter: Principal | ICD-10-CM | POA: Diagnosis present

## 2018-09-28 DIAGNOSIS — Z885 Allergy status to narcotic agent status: Secondary | ICD-10-CM | POA: Diagnosis not present

## 2018-09-28 DIAGNOSIS — Z791 Long term (current) use of non-steroidal anti-inflammatories (NSAID): Secondary | ICD-10-CM | POA: Diagnosis not present

## 2018-09-28 DIAGNOSIS — S81802A Unspecified open wound, left lower leg, initial encounter: Secondary | ICD-10-CM | POA: Diagnosis not present

## 2018-09-28 DIAGNOSIS — T82898A Other specified complication of vascular prosthetic devices, implants and grafts, initial encounter: Secondary | ICD-10-CM | POA: Diagnosis not present

## 2018-09-28 DIAGNOSIS — Z79899 Other long term (current) drug therapy: Secondary | ICD-10-CM | POA: Diagnosis not present

## 2018-09-28 DIAGNOSIS — I5032 Chronic diastolic (congestive) heart failure: Secondary | ICD-10-CM | POA: Diagnosis present

## 2018-09-28 DIAGNOSIS — S71102A Unspecified open wound, left thigh, initial encounter: Secondary | ICD-10-CM | POA: Diagnosis not present

## 2018-09-28 LAB — CBC WITH DIFFERENTIAL/PLATELET
Abs Immature Granulocytes: 0.07 10*3/uL (ref 0.00–0.07)
Basophils Absolute: 0.1 10*3/uL (ref 0.0–0.1)
Basophils Relative: 1 %
Eosinophils Absolute: 0.2 10*3/uL (ref 0.0–0.5)
Eosinophils Relative: 2 %
HCT: 27.4 % — ABNORMAL LOW (ref 39.0–52.0)
Hemoglobin: 8.4 g/dL — ABNORMAL LOW (ref 13.0–17.0)
Immature Granulocytes: 1 %
LYMPHS PCT: 10 %
Lymphs Abs: 1.1 10*3/uL (ref 0.7–4.0)
MCH: 28.7 pg (ref 26.0–34.0)
MCHC: 30.7 g/dL (ref 30.0–36.0)
MCV: 93.5 fL (ref 80.0–100.0)
MONOS PCT: 8 %
Monocytes Absolute: 0.8 10*3/uL (ref 0.1–1.0)
Neutro Abs: 8.6 10*3/uL — ABNORMAL HIGH (ref 1.7–7.7)
Neutrophils Relative %: 78 %
Platelets: 269 10*3/uL (ref 150–400)
RBC: 2.93 MIL/uL — ABNORMAL LOW (ref 4.22–5.81)
RDW: 16.7 % — ABNORMAL HIGH (ref 11.5–15.5)
WBC: 10.9 10*3/uL — ABNORMAL HIGH (ref 4.0–10.5)
nRBC: 0 % (ref 0.0–0.2)

## 2018-09-28 LAB — GLUCOSE, CAPILLARY
Glucose-Capillary: 65 mg/dL — ABNORMAL LOW (ref 70–99)
Glucose-Capillary: 148 mg/dL — ABNORMAL HIGH (ref 70–99)

## 2018-09-28 LAB — CBC
HEMATOCRIT: 28 % — AB (ref 39.0–52.0)
Hemoglobin: 8.6 g/dL — ABNORMAL LOW (ref 13.0–17.0)
MCH: 28.6 pg (ref 26.0–34.0)
MCHC: 30.7 g/dL (ref 30.0–36.0)
MCV: 93 fL (ref 80.0–100.0)
Platelets: 269 10*3/uL (ref 150–400)
RBC: 3.01 MIL/uL — ABNORMAL LOW (ref 4.22–5.81)
RDW: 16.9 % — ABNORMAL HIGH (ref 11.5–15.5)
WBC: 10.4 10*3/uL (ref 4.0–10.5)
nRBC: 0 % (ref 0.0–0.2)

## 2018-09-28 LAB — LACTIC ACID, PLASMA: Lactic Acid, Venous: 0.8 mmol/L (ref 0.5–1.9)

## 2018-09-28 LAB — COMPREHENSIVE METABOLIC PANEL
ALBUMIN: 3.3 g/dL — AB (ref 3.5–5.0)
ALT: 14 U/L (ref 0–44)
AST: 16 U/L (ref 15–41)
Alkaline Phosphatase: 60 U/L (ref 38–126)
Anion gap: 18 — ABNORMAL HIGH (ref 5–15)
BUN: 86 mg/dL — ABNORMAL HIGH (ref 6–20)
CHLORIDE: 96 mmol/L — AB (ref 98–111)
CO2: 24 mmol/L (ref 22–32)
Calcium: 7 mg/dL — ABNORMAL LOW (ref 8.9–10.3)
Creatinine, Ser: 12.75 mg/dL — ABNORMAL HIGH (ref 0.61–1.24)
GFR calc Af Amer: 5 mL/min — ABNORMAL LOW (ref >60)
GFR calc non Af Amer: 4 mL/min — ABNORMAL LOW (ref >60)
GLUCOSE: 163 mg/dL — AB (ref 70–99)
Potassium: 4.2 mmol/L (ref 3.5–5.1)
Sodium: 138 mmol/L (ref 135–145)
Total Bilirubin: 0.5 mg/dL (ref 0.3–1.2)
Total Protein: 7.4 g/dL (ref 6.5–8.1)

## 2018-09-28 LAB — CREATININE, SERUM
Creatinine, Ser: 12.6 mg/dL — ABNORMAL HIGH (ref 0.61–1.24)
GFR calc Af Amer: 5 mL/min — ABNORMAL LOW (ref >60)
GFR, EST NON AFRICAN AMERICAN: 4 mL/min — AB (ref >60)

## 2018-09-28 MED ORDER — SODIUM CHLORIDE 0.9% FLUSH: 3.0000 mL | INTRAVENOUS | Status: DC | PRN

## 2018-09-28 MED ORDER — PANTOPRAZOLE SODIUM 40 MG PO TBEC
40.0000 mg | DELAYED_RELEASE_TABLET | Freq: Every day | ORAL | Status: DC
  Administered 2018-09-28 – 2018-10-05 (×7): 40 mg via ORAL
  Filled 2018-09-28 (×8): qty 1

## 2018-09-28 MED ORDER — ONDANSETRON HCL 4 MG/2ML IJ SOLN
4.0000 mg | Freq: Once | INTRAMUSCULAR | Status: AC
  Administered 2018-09-28: 4 mg via INTRAVENOUS
  Filled 2018-09-28: qty 2

## 2018-09-28 MED ORDER — INSULIN DETEMIR 100 UNIT/ML ~~LOC~~ SOLN
10.0000 [IU] | Freq: Every day | SUBCUTANEOUS | Status: DC
  Filled 2018-09-28 (×2): qty 0.1

## 2018-09-28 MED ORDER — INSULIN ASPART 100 UNIT/ML ~~LOC~~ SOLN: 0.0000 [IU] | Freq: Three times a day (TID) | SUBCUTANEOUS | Status: DC

## 2018-09-28 MED ORDER — SODIUM CHLORIDE 0.9 % IV SOLN: 1.0000 g | Freq: Two times a day (BID) | INTRAVENOUS | Status: DC

## 2018-09-28 MED ORDER — VANCOMYCIN HCL IN DEXTROSE 1-5 GM/200ML-% IV SOLN
1000.0000 mg | INTRAVENOUS | Status: DC
  Filled 2018-09-28 (×2): qty 200

## 2018-09-28 MED ORDER — VANCOMYCIN HCL 10 G IV SOLR
1750.0000 mg | Freq: Once | INTRAVENOUS | Status: AC
  Administered 2018-09-28: 1750 mg via INTRAVENOUS
  Filled 2018-09-28: qty 1750

## 2018-09-28 MED ORDER — INSULIN ASPART 100 UNIT/ML ~~LOC~~ SOLN
0.0000 [IU] | Freq: Every day | SUBCUTANEOUS | Status: DC
  Administered 2018-09-30: 3 [IU] via SUBCUTANEOUS
  Filled 2018-09-28: qty 1

## 2018-09-28 MED ORDER — ONDANSETRON HCL 4 MG/2ML IJ SOLN: 4.0000 mg | Freq: Four times a day (QID) | INTRAMUSCULAR | Status: DC | PRN

## 2018-09-28 MED ORDER — ALBUTEROL SULFATE (2.5 MG/3ML) 0.083% IN NEBU: 2.5000 mg | INHALATION_SOLUTION | RESPIRATORY_TRACT | Status: DC | PRN

## 2018-09-28 MED ORDER — DULAGLUTIDE 0.75 MG/0.5ML ~~LOC~~ SOAJ: 0.7500 mg | SUBCUTANEOUS | Status: DC

## 2018-09-28 MED ORDER — PIPERACILLIN-TAZOBACTAM 3.375 G IVPB 30 MIN
3.3750 g | Freq: Once | INTRAVENOUS | Status: AC
  Administered 2018-09-28: 3.375 g via INTRAVENOUS
  Filled 2018-09-28: qty 50

## 2018-09-28 MED ORDER — INSULIN ASPART 100 UNIT/ML FLEXPEN: 2.0000 [IU] | PEN_INJECTOR | SUBCUTANEOUS | Status: DC | PRN

## 2018-09-28 MED ORDER — POLYETHYLENE GLYCOL 3350 17 G PO PACK
17.0000 g | PACK | Freq: Every day | ORAL | Status: DC | PRN
  Administered 2018-09-29: 17 g via ORAL
  Filled 2018-09-28: qty 1

## 2018-09-28 MED ORDER — SODIUM CHLORIDE 0.9% FLUSH
3.0000 mL | Freq: Two times a day (BID) | INTRAVENOUS | Status: DC
  Administered 2018-09-29 – 2018-10-04 (×10): 3 mL via INTRAVENOUS

## 2018-09-28 MED ORDER — CHLORHEXIDINE GLUCONATE CLOTH 2 % EX PADS
6.0000 | MEDICATED_PAD | Freq: Every day | CUTANEOUS | Status: DC
  Administered 2018-09-29 – 2018-10-05 (×4): 6 via TOPICAL

## 2018-09-28 MED ORDER — ACETAMINOPHEN 500 MG PO TABS: 1000.0000 mg | ORAL_TABLET | Freq: Four times a day (QID) | ORAL | Status: DC | PRN

## 2018-09-28 MED ORDER — RENA-VITE PO TABS
1.0000 | ORAL_TABLET | Freq: Every day | ORAL | Status: DC
  Administered 2018-09-29 – 2018-10-04 (×6): 1 via ORAL
  Filled 2018-09-28 (×7): qty 1

## 2018-09-28 MED ORDER — OXYCODONE-ACETAMINOPHEN 5-325 MG PO TABS
2.0000 | ORAL_TABLET | Freq: Four times a day (QID) | ORAL | Status: DC | PRN
  Administered 2018-09-28 – 2018-10-05 (×11): 2 via ORAL
  Filled 2018-09-28 (×11): qty 2

## 2018-09-28 MED ORDER — SODIUM CHLORIDE 0.9 % IV SOLN
250.0000 mL | INTRAVENOUS | Status: DC | PRN
  Administered 2018-09-28: 250 mL via INTRAVENOUS

## 2018-09-28 MED ORDER — SODIUM CHLORIDE 0.9 % IV SOLN
2.0000 g | INTRAVENOUS | Status: DC
  Administered 2018-09-28: 2 g via INTRAVENOUS
  Filled 2018-09-28 (×3): qty 2

## 2018-09-28 MED ORDER — ONDANSETRON HCL 4 MG PO TABS: 4.0000 mg | ORAL_TABLET | Freq: Four times a day (QID) | ORAL | Status: DC | PRN

## 2018-09-28 MED ORDER — VANCOMYCIN HCL IN DEXTROSE 1-5 GM/200ML-% IV SOLN
1000.0000 mg | Freq: Once | INTRAVENOUS | Status: DC
  Filled 2018-09-28: qty 200

## 2018-09-28 MED ORDER — FERRIC CITRATE 1 GM 210 MG(FE) PO TABS
210.0000 mg | ORAL_TABLET | Freq: Three times a day (TID) | ORAL | Status: DC
  Administered 2018-09-28 – 2018-09-30 (×3): 210 mg via ORAL
  Filled 2018-09-28 (×7): qty 1

## 2018-09-28 MED ORDER — FENTANYL CITRATE (PF) 100 MCG/2ML IJ SOLN
50.0000 ug | Freq: Once | INTRAMUSCULAR | Status: AC
  Administered 2018-09-28: 50 ug via INTRAVENOUS
  Filled 2018-09-28: qty 2

## 2018-09-28 MED ORDER — ALBUTEROL SULFATE HFA 108 (90 BASE) MCG/ACT IN AERS: 2.0000 | INHALATION_SPRAY | RESPIRATORY_TRACT | Status: DC | PRN

## 2018-09-28 MED ORDER — CYCLOBENZAPRINE HCL 10 MG PO TABS
5.0000 mg | ORAL_TABLET | Freq: Three times a day (TID) | ORAL | Status: DC | PRN
  Administered 2018-10-03: 5 mg via ORAL
  Filled 2018-09-28: qty 1

## 2018-09-28 MED ORDER — HEPARIN SODIUM (PORCINE) 5000 UNIT/ML IJ SOLN
5000.0000 [IU] | Freq: Three times a day (TID) | INTRAMUSCULAR | Status: DC
  Administered 2018-09-29 – 2018-10-03 (×5): 5000 [IU] via SUBCUTANEOUS
  Filled 2018-09-28 (×10): qty 1

## 2018-09-28 NOTE — ED Notes (Signed)
ED TO INPATIENT HANDOFF REPORT  ED Nurse Name and Phone #: Receiving Nurse   S Name/Age/Gender [name] 53 y.o. male Room/Bed: ED09A/ED09A  Code Status   Code Status: Prior  Home/SNF/Other Home Patient oriented to: self Is this baseline? Yes   Triage Complete: Triage complete  Chief Complaint graft infected  Triage Note Patient presents with infected dialysis graft on Left thigh. Patient reports clear drainage and odor from site. Dialysis sent patient here once they saw site. Normally gets dialysis M,W,F and last received dialysis Friday 09/25/18   Allergies Allergies  Allergen Reactions  . Hydrocodone Nausea And Vomiting    Level of Care/Admitting Diagnosis ED Disposition    ED Disposition Condition [facility]: [place] [[id number]]  Level of Care: Med-Surg [16]  Diagnosis: Infection of AV graft for dialysis [facility]) [[id number]]  Admitting Physician: Gorden Harms [[id number]]  Attending Physician: Gorden Harms [[id number]]  Estimated length of stay: past midnight tomorrow  Certification:: I certify this patient will need inpatient services for at least 2 midnights  PT Class (Do Not Modify): Inpatient [101]  PT Acc Code (Do Not Modify): Private [1]       B Medical/Surgery History Past Medical History:  Diagnosis Date  . Amputee, below knee, left ([place])   . Anemia   . Blood transfusion   . Bronchitis   . Chest pain   . CHF (congestive heart failure) ([place])   . Chronic kidney disease    dialysis, M-W-F, [facility]  . Diabetes mellitus    type 2  . Dysrhythmia    irregular heartbeat  . ESRD (end stage renal disease) on dialysis ([place])    first HD 06/19/04  . GERD (gastroesophageal reflux disease)   . Hyperparathyroidism ([place])   . Morbid obesity ([place])    Past Surgical History:  Procedure Laterality Date  . AMPUTATION Left 01/20/2015   Procedure: Left Great Toe Amputation;  Surgeon: Newt Minion, MD;  Location:  [place];  Service: Orthopedics;  Laterality: Left;  . AMPUTATION Left 03/07/2015   Procedure: Left Foot 1st and 2nd Ray Amputation;  Surgeon: Newt Minion, MD;  Location: [place];  Service: Orthopedics;  Laterality: Left;  . AMPUTATION Left 04/14/2015   Procedure: LEFT BELOW THE KNEE AMPUTATION ;  Surgeon: Newt Minion, MD;  Location: [place];  Service: Orthopedics;  Laterality: Left;  . ANKLE FUSION     rt foot charcot joint 2008 with refusion  . AV FISTULA PLACEMENT  10/11/2010  . AV FISTULA PLACEMENT  07/02/2011   Procedure: INSERTION OF ARTERIOVENOUS (AV) GORE-TEX GRAFT ARM;  Surgeon: Elam Dutch, MD;  Location: [place];  Service: Vascular;  Laterality: Left;  . AV FISTULA PLACEMENT Right 11/06/2012   Procedure: INSERTION OF ARTERIOVENOUS (AV) GORE-TEX GRAFT ARM;  Surgeon: Mal Misty, MD;  Location: [place];  Service: Vascular;  Laterality: Right;  . AV FISTULA PLACEMENT Right 03/21/2016   Procedure: INSERTION OF ARTERIOVENOUS (AV) GORE-TEX GRAFT ARM ( LOOP FOREARM );  Surgeon: Algernon Huxley, MD;  Location: [facility];  Service: Vascular;  Laterality: Right;  . AV FISTULA PLACEMENT Left 11/07/2016   Procedure: INSERTION OF ARTERIOVENOUS (AV) Artegraft;  Surgeon: Algernon Huxley, MD;  Location: [facility];  Service: Vascular;  Laterality: Left;  . [place] REMOVAL Right 11/06/2012   Procedure: REMOVAL OF ARTERIOVENOUS GORETEX GRAFT ([place]);  Surgeon: Mal Misty, MD;  Location: [place];  Service: Vascular;  Laterality: Right;  . ENDARTERECTOMY FEMORAL  11/07/2016   Procedure: ENDARTERECTOMY FEMORAL;  Surgeon: Algernon Huxley, MD;  Location: [facility];  Service: Vascular;;  . HERNIA REPAIR     Umbilical Hernia  . INSERTION OF DIALYSIS CATHETER  01/20/2012   Procedure: INSERTION OF DIALYSIS CATHETER;  Surgeon: Serafina Mitchell, MD;  Location: [place];  Service: Vascular;  Laterality: N/A;  insertion left internal jugular dialysis catheter 23cm  . INSERTION OF DIALYSIS CATHETER Right 11/09/2012   Procedure: INSERTION OF  DIALYSIS CATHETER;  Surgeon: Conrad [place], MD;  Location: [place];  Service: Vascular;  Laterality: Right;  Right  Femoral Vein  . INSERTION OF DIALYSIS CATHETER N/A 08/18/2015   Procedure: INSERTION OF DIALYSIS CATHETER RIGHT FEMORAL VEIN;  Surgeon: Mal Misty, MD;  Location: [place];  Service: Cardiovascular;  Laterality: N/A;  . PARATHYROIDECTOMY    . PERIPHERAL VASCULAR CATHETERIZATION Right 04/04/2016   Procedure: A/V Shuntogram/Fistulagram;  Surgeon: Algernon Huxley, MD;  Location: [place] [facility];  Service: Cardiovascular;  Laterality: Right;  . REMOVAL OF A HERO DEVICE Left 08/14/2015   Procedure: REMOVAL OF LEFT ARM HERO DEVICE;  Surgeon: Rosetta Posner, MD;  Location: [place];  Service: Vascular;  Laterality: Left;  . TEE WITHOUT CARDIOVERSION N/A 08/18/2015   Procedure: TRANSESOPHAGEAL ECHOCARDIOGRAM (TEE);  Surgeon: Mal Misty, MD;  Location: [place];  Service: Cardiovascular;  Laterality: N/A;  . TEE WITHOUT CARDIOVERSION N/A 12/21/2015   Procedure: TRANSESOPHAGEAL ECHOCARDIOGRAM (TEE);  Surgeon: Dixie Dials, MD;  Location: [facility]OS[facility];  Service: Cardiovascular;  Laterality: N/A;  . THROMBECTOMY    . Umbilcial Hernia    . UPPER EXTREMITY VENOGRAPHY Left 10/31/2016   Procedure: Upper Extremity Venography;  Surgeon: Algernon Huxley, MD;  Location: [place] [facility];  Service: Cardiovascular;  Laterality: Left;  Marland Kitchen VENOGRAM N/A 01/06/2014   Procedure: VENOGRAM;  Surgeon: Conrad Kildeer, MD;  Location: [facility] C[facility];  Service: Cardiovascular;  Laterality: N/A;     A IV Location/Drains/Wounds Patient Lines/Drains/Airways Status   Active Line/Drains/Airways    Name:   Placement date:   Placement time:   Site:   Days:   Peripheral IV 09/28/18 Right Antecubital   09/28/18    1439    Antecubital   less than 1   Vascular Access Left Internal jugular Hemodialysis catheter   02/13/12    1516    Internal jugular   2419   Vascular Access Left Internal jugular Hemodialysis catheter   02/24/12    0726     Internal jugular   2408   Vascular Access Right Upper arm Arteriovenous vein graft   02/24/12    0950    Upper arm   2408   Vascular Access Left Internal jugular Hemodialysis catheter   02/25/12    1053    Internal jugular   2407   Vascular Access Left Upper arm Arteriovenous vein graft   -    -    Upper arm      Vascular Access Right Upper arm Arteriovenous vein graft   11/06/12    1738    Upper arm   2152   Fistula / Graft Right Femoral vein Hemodialysis catheter   11/09/12    1136    Femoral vein   2149   Fistula / Graft Right Forearm Arteriovenous fistula   -    -    Forearm      Fistula / Graft Left Thigh Arteriovenous vein  graft   11/07/16    1326    Thigh   690   Hemodialysis Catheter Right   11/20/12    1252    Femoral vein   2138   Hemodialysis Catheter Right Femoral vein Double-lumen   08/18/15    1144    Femoral vein   1137   Sheath 10/31/16 Left Venous   10/31/16    1055    Venous   697   Incision 01/19/12 Shoulder   01/19/12    1156     2444   Incision 01/19/12 Arm Left   01/19/12    1156     2444   Incision 01/20/12 Chest Right   01/20/12    1504     2443   Incision 02/24/12 Arm Right   02/24/12    0843     2408   Incision 11/06/12 Arm Right   11/06/12    1745     2152   Incision 11/09/12 Groin Right   11/09/12    1035     2149   Incision (Closed) 01/20/15 Foot Left   01/20/15    1730     1347   Incision (Closed) 03/07/15 Foot   03/07/15    1055     1301   Incision (Closed) 04/14/15 Leg Left   04/14/15    1506     1263   Incision (Closed) 08/14/15 Arm Left   08/14/15    1235     1141   Incision (Closed) 08/18/15 Groin Right   08/18/15    1111     1137   Incision (Closed) 11/07/16 Groin Left   11/07/16    1443     690          Intake/Output Last 24 hours  Intake/Output Summary (Last 24 hours) at 09/28/2018 1544 Last data filed at 09/28/2018 1541 Gross per 24 hour  Intake 100 ml  Output -  Net 100 ml    Labs/Imaging Results for orders placed or performed during the  hospital encounter of 09/28/18 (from the past 48 hour(s))  Comprehensive metabolic panel     Status: Abnormal   Collection Time: 09/28/18  1:29 PM  Result Value Ref Range   Sodium 138 135 - 145 mmol/L   Potassium 4.2 3.5 - 5.1 mmol/L   Chloride 96 (L) 98 - 111 mmol/L   CO2 24 22 - 32 mmol/L   Glucose, Bld 163 (H) 70 - 99 mg/dL   BUN 86 (H) 6 - 20 mg/dL   Creatinine, Ser 12.75 (H) 0.61 - 1.24 mg/dL   Calcium 7.0 (L) 8.9 - 10.3 mg/dL   Total Protein 7.4 6.5 - 8.1 g/dL   Albumin 3.3 (L) 3.5 - 5.0 g/dL   AST 16 15 - 41 U/L   ALT 14 0 - 44 U/L   Alkaline Phosphatase 60 38 - 126 U/L   Total Bilirubin 0.5 0.3 - 1.2 mg/dL   GFR calc non Af Amer 4 (L) >60 mL/min   GFR calc Af Amer 5 (L) >60 mL/min   Anion gap 18 (H) 5 - 15    Comment: Performed at [facility], [address] [place], [place] [address]  CBC with Differential     Status: Abnormal   Collection Time: 09/28/18  1:29 PM  Result Value Ref Range   WBC 10.9 (H) 4.0 - 10.5 K/uL   RBC 2.93 (L) 4.22 - 5.81 MIL/uL   Hemoglobin 8.4 (  L) 13.0 - 17.0 g/dL   HCT 27.4 (L) 39.0 - 52.0 %   MCV 93.5 80.0 - 100.0 fL   MCH 28.7 26.0 - 34.0 pg   MCHC 30.7 30.0 - 36.0 g/dL   RDW 16.7 (H) 11.5 - 15.5 %   Platelets 269 150 - 400 K/uL   nRBC 0.0 0.0 - 0.2 %   Neutrophils Relative % 78 %   Neutro Abs 8.6 (H) 1.7 - 7.7 K/uL   Lymphocytes Relative 10 %   Lymphs Abs 1.1 0.7 - 4.0 K/uL   Monocytes Relative 8 %   Monocytes Absolute 0.8 0.1 - 1.0 K/uL   Eosinophils Relative 2 %   Eosinophils Absolute 0.2 0.0 - 0.5 K/uL   Basophils Relative 1 %   Basophils Absolute 0.1 0.0 - 0.1 K/uL   Immature Granulocytes 1 %   Abs Immature Granulocytes 0.07 0.00 - 0.07 K/uL    Comment: Performed at [facility], [address] [place], [place] [address]  Lactic acid, plasma     Status: None   Collection Time: 09/28/18  2:40 PM  Result Value Ref Range   Lactic Acid, Venous 0.8 0.5 - 1.9 mmol/L    Comment: Performed at [facility], [address] [place], [place] [address]   No results found.  Pending Labs Unresulted Labs (From admission, onward)    Start     Ordered   09/28/18 1409  Blood culture (routine x 2)  BLOOD CULTURE X 2,   STAT     09/28/18 1408   09/28/18 1318  Urinalysis, Complete w Microscopic  ONCE - STAT,   STAT     09/28/18 1317   Signed and Held  HIV antibody (Routine Testing)  Once,   R     Signed and Held   Signed and Held  CBC  (heparin)  Once,   R    Comments:  Baseline for heparin therapy IF NOT ALREADY DRAWN.  Notify MD if PLT < 100 K.    Signed and Held   Signed and Held  Creatinine, serum  (heparin)  Once,   R    Comments:  Baseline for heparin therapy IF NOT ALREADY DRAWN.    Signed and Held   Signed and Held  Basic metabolic panel  Tomorrow morning,   R     Signed and Held   Signed and Held  CBC  Tomorrow morning,   R     Signed and Held   Signed and Held  Phosphorus  Once,   R     Signed and Held   Signed and Held  Parathyroid hormone, intact (no Ca)  Once,   R     Signed and Held          Vitals/Pain Today's Vitals   09/28/18 1316 09/28/18 1530 09/28/18 1541  BP: 119/67 (!) 109/42   Pulse: 99    Resp: 14 20   Temp: 98.4 F (36.9 C)    TempSrc: Oral    SpO2: 100%    Weight: 90.7 kg    Height: 6\' 1"  (1.854 m)    PainSc: 10-Worst pain ever  7     Isolation Precautions No active isolations  Medications Medications  vancomycin (VANCOCIN) 1,750 mg in sodium chloride 0.9 % 500 mL IVPB (has no administration in time range)  Chlorhexidine Gluconate Cloth 2 % PADS 6 each (has no administration in time range)  vancomycin (VANCOCIN) IVPB 1000 mg/200 mL premix (  has no administration in time range)  fentaNYL (SUBLIMAZE) injection 50 mcg (50 mcg Intravenous Given 09/28/18 1453)  ondansetron (ZOFRAN) injection 4 mg (4 mg Intravenous Given 09/28/18 1453)  piperacillin-tazobactam (ZOSYN) IVPB 3.375 g (0 g Intravenous Stopped 09/28/18 1541)    Mobility walks Moderate  fall risk   Focused Assessments Cardiac Assessment Handoff:    Lab Results  Component Value Date   CKTOTAL 66 08/14/2015   TROPONINI <0.03 09/12/2015   Lab Results  Component Value Date   DDIMER (H) 08/31/2010    1.63        AT THE INHOUSE ESTABLISHED CUTOFF VALUE OF 0.48 ug/mL FEU, THIS ASSAY HAS BEEN DOCUMENTED IN THE LITERATURE TO HAVE A SENSITIVITY AND NEGATIVE PREDICTIVE VALUE OF AT LEAST 98 TO 99%.  THE TEST RESULT SHOULD BE CORRELATED WITH AN ASSESSMENT OF THE CLINICAL PROBABILITY OF DVT / VTE.   Does the Patient currently have chest pain? No     R Recommendations: See Admitting Provider Note  Report given to: receiving nurse   Additional Notes:

## 2018-09-28 NOTE — Telephone Encounter (Signed)
The patient is scheduled with Dr. Lucky Cowboy for 10/01/2018 for a shuntogram, if the dialysis center wants the patient seen for a office visit instead they will need to send over a referral for a office visit.

## 2018-09-28 NOTE — ED Notes (Signed)
Labs and culture x 1 obtained and sent.

## 2018-09-28 NOTE — Progress Notes (Signed)
Family Meeting Note  Advance Directive:yes  Today a meeting took place with the Patient.  Patient is able to participate   The following clinical team members were present during this meeting:MD  The following were discussed:Patient's diagnosis:esrd , Patient's progosis: Unable to determine and Goals for treatment: Full Code  Additional follow-up to be provided: prn  Time spent during discussion:20 minutes  Montell D Salary, MD  

## 2018-09-28 NOTE — ED Triage Notes (Signed)
Patient presents with infected dialysis graft on Left thigh. Patient reports clear drainage and odor from site. Dialysis sent patient here once they saw site. Normally gets dialysis M,W,F and last received dialysis Friday 09/25/18

## 2018-09-28 NOTE — Progress Notes (Signed)
SUBJECTIVE:  Patient ID: [name], male    DOB: 1966-07-13, 53 y.o.   MRN: [id number] Chief Complaint  Patient presents with  . Follow-up    HPI  [name] is a 53 y.o. male that presents today in office for evaluation of his left thigh graft have her not being seen in our office for approximately 2 years.  He recently underwent thrombectomy 2 months ago through [facility] in [place].  The patient endorses having increasing problems with his dialysis access including an ulceration.  He states that this ulceration has been present for about a month and that his dialysis center is aware.  He denies any fever, chills, nausea, vomiting or diarrhea.  He denies any chest pain or shortness of breath.  Patient also has a history of peripheral vascular disease with multiple vascular interventions.  He has a history of a superficial femoral artery endarterectomy on 11/07/2016.  He was subsequently sent to [place] today due to the fact there is no palpable thrill for his graft.  He underwent a hemodialysis duplex today which revealed a flow volume of 282.  There are partially occlusive flows near the venous and arterial anastomoses.  Imaging was complicated due to the wound at the AV graft site.  Past Medical History:  Diagnosis Date  . Amputee, below knee, left ([place])   . Anemia   . Blood transfusion   . Bronchitis   . Chest pain   . CHF (congestive heart failure) ([place])   . Chronic kidney disease    dialysis, M[facility]W-F, [facility]  . Diabetes mellitus    type 2  . Dysrhythmia    irregular heartbeat  . ESRD (end stage renal disease) on dialysis ([place])    first HD 06/19/04  . GERD (gastroesophageal reflux disease)   . Hyperparathyroidism ([place])   . Morbid obesity ([place])     Past Surgical History:  Procedure Laterality Date  . AMPUTATION Left 01/20/2015   Procedure: Left Great Toe Amputation;  Surgeon: Newt Minion, MD;  Location: [facility];  Service: Orthopedics;  Laterality: Left;  .  AMPUTATION Left 03/07/2015   Procedure: Left Foot 1st and 2nd Ray Amputation;  Surgeon: Newt Minion, MD;  Location: [facility];  Service: Orthopedics;  Laterality: Left;  . AMPUTATION Left 04/14/2015   Procedure: LEFT BELOW THE KNEE AMPUTATION ;  Surgeon: Newt Minion, MD;  Location: [facility];  Service: Orthopedics;  Laterality: Left;  . ANKLE FUSION     rt foot charcot joint 2008 with refusion  . AV FISTULA PLACEMENT  10/11/2010  . AV FISTULA PLACEMENT  07/02/2011   Procedure: INSERTION OF ARTERIOVENOUS (AV) GORE-TEX GRAFT ARM;  Surgeon: Elam Dutch, MD;  Location: [facility];  Service: Vascular;  Laterality: Left;  . AV FISTULA PLACEMENT Right 11/06/2012   Procedure: INSERTION OF ARTERIOVENOUS (AV) GORE-TEX GRAFT ARM;  Surgeon: Mal Misty, MD;  Location: [facility];  Service: Vascular;  Laterality: Right;  . AV FISTULA PLACEMENT Right 03/21/2016   Procedure: INSERTION OF ARTERIOVENOUS (AV) GORE-TEX GRAFT ARM ( LOOP FOREARM );  Surgeon: Algernon Huxley, MD;  Location: [facility];  Service: Vascular;  Laterality: Right;  . AV FISTULA PLACEMENT Left 11/07/2016   Procedure: INSERTION OF ARTERIOVENOUS (AV) Artegraft;  Surgeon: Algernon Huxley, MD;  Location: [facility];  Service: Vascular;  Laterality: Left;  . [employer] REMOVAL Right 11/06/2012   Procedure: REMOVAL OF ARTERIOVENOUS GORETEX GRAFT ([place]);  Surgeon: Mal Misty, MD;  [facility];  Service: Vascular;  Laterality: Right;  . ENDARTERECTOMY FEMORAL  11/07/2016   Procedure: ENDARTERECTOMY FEMORAL;  Surgeon: Algernon Huxley, MD;  Location: [facility];  Service: Vascular;;  . HERNIA REPAIR     Umbilical Hernia  . INSERTION OF DIALYSIS CATHETER  01/20/2012   Procedure: INSERTION OF DIALYSIS CATHETER;  Surgeon: Serafina Mitchell, MD;  Location: [facility];  Service: Vascular;  Laterality: N/A;  insertion left internal jugular dialysis catheter 23cm  . INSERTION OF DIALYSIS CATHETER Right 11/09/2012   Procedure: INSERTION OF DIALYSIS CATHETER;  Surgeon: Conrad Terre Hill, MD;   Location: [facility];  Service: Vascular;  Laterality: Right;  Right  Femoral Vein  . INSERTION OF DIALYSIS CATHETER N/A 08/18/2015   Procedure: INSERTION OF DIALYSIS CATHETER RIGHT FEMORAL VEIN;  Surgeon: Mal Misty, MD;  Location: [facility];  Service: Cardiovascular;  Laterality: N/A;  . PARATHYROIDECTOMY    . PERIPHERAL VASCULAR CATHETERIZATION Right 04/04/2016   Procedure: A/V Shuntogram/Fistulagram;  Surgeon: Algernon Huxley, MD;  Location: [facility];  Service: Cardiovascular;  Laterality: Right;  . REMOVAL OF A HERO DEVICE Left 08/14/2015   Procedure: REMOVAL OF LEFT ARM HERO DEVICE;  Surgeon: Rosetta Posner, MD;  Location: [facility];  Service: Vascular;  Laterality: Left;  . TEE WITHOUT CARDIOVERSION N/A 08/18/2015   Procedure: TRANSESOPHAGEAL ECHOCARDIOGRAM (TEE);  Surgeon: Mal Misty, MD;  Location: [facility];  Service: Cardiovascular;  Laterality: N/A;  . TEE WITHOUT CARDIOVERSION N/A 12/21/2015   Procedure: TRANSESOPHAGEAL ECHOCARDIOGRAM (TEE);  Surgeon: Dixie Dials, MD;  Location: [facility];  Service: Cardiovascular;  Laterality: N/A;  . THROMBECTOMY    . Umbilcial Hernia    . UPPER EXTREMITY VENOGRAPHY Left 10/31/2016   Procedure: Upper Extremity Venography;  Surgeon: Algernon Huxley, MD;  Location: [facility];  Service: Cardiovascular;  Laterality: Left;  Marland Kitchen VENOGRAM N/A 01/06/2014   Procedure: VENOGRAM;  Surgeon: Conrad [place], MD;  Location: [facility];  Service: Cardiovascular;  Laterality: N/A;    Social History   Socioeconomic History  . Marital status: Married    Spouse name: Not on file  . Number of children: Not on file  . Years of education: Not on file  . Highest education level: Not on file  Occupational History  . Not on file  Social Needs  . Financial resource strain: Not on file  . Food insecurity:    Worry: Not on file    Inability: Not on file  . Transportation needs:    Medical: Not on file    Non-medical: Not on file  Tobacco Use  . Smoking status:  Current Every Day Smoker    Years: 20.00    Types: Cigarettes  . Smokeless tobacco: Never Used  Substance and Sexual Activity  . Alcohol use: No    Alcohol/week: 0.0 standard drinks  . Drug use: Yes    Comment: oxycodone prescribed  . Sexual activity: Not Currently  Lifestyle  . Physical activity:    Days per week: Not on file    Minutes per session: Not on file  . Stress: Not on file  Relationships  . Social connections:    Talks on phone: Not on file    Gets together: Not on file    Attends religious service: Not on file    Active member of club or organization: Not on file    Attends meetings of clubs or organizations: Not on file    Relationship  status: Not on file  . Intimate partner violence:    Fear of current or ex partner: Not on file    Emotionally abused: Not on file    Physically abused: Not on file    Forced sexual activity: Not on file  Other Topics Concern  . Not on file  Social History Narrative  . Not on file    Family History  Problem Relation Age of Onset  . Heart disease Mother   . Kidney disease Mother   . Hypertension Mother     Allergies  Allergen Reactions  . Hydrocodone Nausea And Vomiting     Review of Systems   Review of Systems: Negative Unless Checked Constitutional: [] Weight loss  [] Fever  [] Chills Cardiac: [] Chest pain   []  Atrial Fibrillation  [] Palpitations   [] Shortness of breath when laying flat   [] Shortness of breath with exertion. [] Shortness of breath at rest Vascular:  [] Pain in legs with walking   [] Pain in legs with standing [] Pain in legs when laying flat   [] Claudication    [] Pain in feet when laying flat    [] History of DVT   [] Phlebitis   [x] Swelling in legs   [] Varicose veins   [] Non-healing ulcers Pulmonary:   [] Uses home oxygen   [] Productive cough   [] Hemoptysis   [] Wheeze  [] COPD   [] Asthma Neurologic:  [] Dizziness   [] Seizures  [] Blackouts [] History of stroke   [] History of TIA  [] Aphasia   [] Temporary Blindness    [] Weakness or numbness in arm   [x] Weakness or numbness in leg Musculoskeletal:   [] Joint swelling   [] Joint pain   [] Low back pain  []  History of Knee Replacement [] Arthritis [] back Surgeries  []  Spinal Stenosis    Hematologic:  [] Easy bruising  [] Easy bleeding   [] Hypercoagulable state   [x] Anemic Gastrointestinal:  [] Diarrhea   [] Vomiting  [] Gastroesophageal reflux/heartburn   [] Difficulty swallowing. [] Abdominal pain Genitourinary:  [x] Chronic kidney disease   [] Difficult urination  [] Anuric   [] Blood in urine [] Frequent urination  [] Burning with urination   [] Hematuria Skin:  [] Rashes   [] Ulcers [] Wounds Psychological:  [] History of anxiety   []  History of major depression  []  Memory Difficulties      OBJECTIVE:   Physical Exam  BP 111/70 (BP Location: Left Arm, Patient Position: Sitting, Cuff Size: Large)   Pulse (!) 103   Resp 12   Ht 6\' 1"  (1.854 m)   Wt 197 lb (89.4 kg)   BMI 25.99 kg/m   Gen: WD/WN, NAD Head: [place]/AT, No temporalis wasting.  Ear/Nose/Throat: Hearing grossly intact, nares w/o erythema or drainage Eyes: PER, EOMI, sclera nonicteric.  Neck: Supple, no masses.  No JVD.  Pulmonary:  Good air movement, no use of accessory muscles.  Cardiac: RRR Vascular:  No thrill present very faint bruit Vessel Right Left  Radial Palpable Palpable   Gastrointestinal: soft, non-distended. No guarding/no peritoneal signs.  Musculoskeletal: M/S 5/5 throughout.    Left below-knee amputation Neurologic: Pain and light touch intact in extremities.  Symmetrical.  Speech is fluent. Motor exam as listed above. Psychiatric: Judgment intact, Mood & affect appropriate for pt's clinical situation. Dermatologic:  Half centimeter ulceration above the graft site.  Graft not visible.  No drainage.  No foul-smelling odor.  Clean dry Lymph : No Cervical lymphadenopathy, no lichenification or skin changes of chronic lymphedema.       ASSESSMENT AND PLAN:  1. ESRD on dialysis [facility])  Recommend:  The patient is experiencing increasing problems with  their dialysis access.  Patient should have a left thigh graft shuntogram with the intention for intervention.  The intention for intervention is to restore appropriate flow and prevent thrombosis and possible loss of the access.  As well as improve the quality of dialysis therapy.  The risks, benefits and alternative therapies were reviewed in detail with the patient.  All questions were answered.  The patient agrees to proceed with angio/intervention.    I have also instructed the patient that he should utilize over-the-counter bacitracin or triple antibiotic ointment for his ulceration.  He needs to be sure to keep it clean and dry.  If he begins to have any drainage or worsening of the ulceration he should contact our office for evaluation.  We will try conservative methods for healing however if it is slow healing we may need to consider more invasive options.  2. Uncontrolled type 2 diabetes mellitus with hyperglycemia ([place]) Continue hypoglycemic medications as already ordered, these medications have been reviewed and there are no changes at this time.  Hgb A1C to be monitored as already arranged by primary service   3. Gastroesophageal reflux disease, esophagitis presence not specified Continue PPI as already ordered, this medication has been reviewed and there are no changes at this time.  Avoidence of caffeine and alcohol  Moderate elevation of the head of the bed    No current facility-administered medications on file prior to visit.    Current Outpatient Medications on File Prior to Visit  Medication Sig Dispense Refill  . acetaminophen (TYLENOL) 500 MG tablet Take 1,000 mg by mouth as needed for moderate pain (takes everytime with dialysis).    Marland Kitchen aspirin EC 81 MG EC tablet Take 1 tablet (81 mg total) by mouth daily. 30 tablet 11  . AURYXIA 1 GM 210 MG(Fe) tablet Take 1 tablet by mouth 3 (three) times daily  with meals.    Marland Kitchen b complex-vitamin c-folic acid (NEPHRO-VITE) 0.8 MG TABS tablet Take 1 tablet by mouth at bedtime. 30 tablet 0  . cyclobenzaprine (FLEXERIL) 5 MG tablet Take 5 mg by mouth as needed for muscle spasms.    . Dulaglutide (TRULICITY) 9.52 WU/1.3KG SOPN Inject 0.75 mg into the skin every Friday.     Marland Kitchen ibuprofen (ADVIL,MOTRIN) 200 MG tablet Take 400 mg by mouth every 6 (six) hours as needed for moderate pain.    Marland Kitchen insulin detemir (LEVEMIR) 100 UNIT/ML injection Inject 0.2 mLs (20 Units total) into the skin at bedtime. (Patient taking differently: Inject 5-20 Units into the skin at bedtime. Per sliding scale) 10 mL 11  . NOVOLOG FLEXPEN 100 UNIT/ML FlexPen 2-3 Units as needed for high blood sugar. Per sliding scale    . oxyCODONE-acetaminophen (PERCOCET/ROXICET) 5-325 MG tablet Take 1 tablet by mouth every 6 (six) hours as needed for severe pain. (Patient taking differently: Take 2 tablets by mouth every 6 (six) hours as needed for severe pain (leg pain). ) 15 tablet 0  . pantoprazole (PROTONIX) 40 MG tablet Take 40 mg by mouth daily.    . VENTOLIN HFA 108 (90 Base) MCG/ACT inhaler Inhale 2 puffs into the lungs as needed for wheezing.       There are no Patient Instructions on file for this visit. No follow-ups on file.   Kris Hartmann, NP  This note was completed with Sales executive.  Any errors are purely unintentional.

## 2018-09-28 NOTE — Consult Note (Signed)
Pharmacy Antibiotic Note  [name] is a 53 y.o. male admitted on 09/28/2018 with and infected AV fistula. Pharmacy has been consulted for vancomycin dosing. He is also prescribed cefepime. He presents for evaluation of dialysis graft ulcer and infection.  Patient is s/p Left superficial femoral artery to femoral veinarteriovenous graft femoral artery endarterectomy. Dr. Lucky Cowboy is planning for a temporary hemodialysis catheter placement  Plan: Vancomycin 1000 mg IV every MWF HD session following a 1750 mg loading dose  Goal pre-HD vancomycin level: 15-25 mcg/mL with level to be drawn prior to 3rd HD session  Height: 6\' 1"  (185.4 cm) Weight: 200 lb (90.7 kg) IBW/kg (Calculated) : 79.9  Temp (24hrs), Avg:98.4 F (36.9 C), Min:98.4 F (36.9 C), Max:98.4 F (36.9 C)  Recent Labs  Lab 09/28/18 1329  WBC 10.9*  CREATININE 12.75*    Estimated Creatinine Clearance: 7.4 mL/min (A) (by C-G formula based on SCr of 12.75 mg/dL (H)).    Antimicrobials this admission: Vancomycin 3/16 >>  cefepime 3/16 >>  Zosyn 3/16 x 1  Microbiology results: 3/16 BCx: pending 3/16 UCx: pending   Thank you for allowing pharmacy to be a part of this patient's care.  Dallie Piles, PharmD 09/28/2018 2:53 PM

## 2018-09-28 NOTE — ED Notes (Signed)
Reports pain right bka/ reports graph infection.

## 2018-09-28 NOTE — Consult Note (Signed)
[facility] Vascular Consult Note  MRN : [id number]  [name] is a 53 y.o. (Aug 08, 1965) male who presents with chief complaint of  Chief Complaint  Patient presents with  . Vascular Access Problem   History of Present Illness:  The patient is a 53 year old male with a past medical history of morbid obesity, hyperparathyroidism, GERD, end-stage renal disease on chronic hemodialysis, dysrhythmia, diabetes, congestive heart failure, anemia, below the knee amputation, PVD who presented to the [facility] emergency department from his dialysis center today with a chief complaint of a "graft infection".  On 11/07/16, The patient is s/p: 1. Left superficial femoral artery to femoral vein arteriovenous graft with Artegraft 2. Left superficial femoral artery endarterectomy  The patient was recently seen in our office on 09/24/18 and noted to have a half centimeter ulceration above the graft site with the graft not visible.  No drainage or foul-smelling odor.   He underwent a hemodialysis duplex which revealed a flow volume of 282.  There were partially occlusive flows near the venous and arterial anastomoses.  Imaging was complicated / incomplete due to the wound at the AV graft site.   The patient endorses a history of being able to dialyze successfully on Friday.  The patient was sent to the emergency department by his dialysis center today when a wound overlying his graft site was noted.  States his graft site has become progressively more tender.  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  The patient is status post a left below the knee amputation.  Denies any issues to his stump.  He continues to use his prosthetic for ambulation.  Patient denies any fever, nausea vomiting.  Potassium (09/28/18): 4.2  Vascular surgery was consulted by Dr. Jerelyn Charles for further recommendations.  Current Facility-Administered Medications   Medication Dose Route Frequency Provider Last Rate Last Dose  . 0.9 %  sodium chloride infusion  250 mL Intravenous PRN Salary, Montell D, MD      . acetaminophen (TYLENOL) tablet 1,000 mg  1,000 mg Oral Q6H PRN Salary, Montell D, MD      . albuterol (PROVENTIL) (2.5 MG/3ML) 0.083% nebulizer solution 2.5 mg  2.5 mg Nebulization Q2H PRN Salary, Montell D, MD      . ceFEPIme (MAXIPIME) 2 g in sodium chloride 0.9 % 100 mL IVPB  2 g Intravenous Q M,W,F-HD Salary, Montell D, MD      . Derrill Memo ON 09/29/2018] Chlorhexidine Gluconate Cloth 2 % PADS 6 each  6 each Topical Q0600 Lateef, Munsoor, MD      . cyclobenzaprine (FLEXERIL) tablet 5 mg  5 mg Oral TID PRN Salary, Montell D, MD      . ferric citrate (AURYXIA) tablet 210 mg  210 mg Oral TID WC Salary, Montell D, MD      . heparin injection 5,000 Units  5,000 Units Subcutaneous Q8H Salary, Montell D, MD      . insulin aspart (novoLOG) injection 0-15 Units  0-15 Units Subcutaneous TID WC Salary, Montell D, MD      . insulin aspart (novoLOG) injection 0-5 Units  0-5 Units Subcutaneous QHS Salary, Montell D, MD      . insulin detemir (LEVEMIR) injection 10 Units  10 Units Subcutaneous QHS Salary, Montell D, MD      . multivitamin (RENA-VIT) tablet 1 tablet  1 tablet Oral QHS Salary, Montell D, MD      . ondansetron (ZOFRAN) tablet 4 mg  4 mg Oral Q6H PRN Salary, Montell D, MD       Or  . ondansetron (ZOFRAN) injection 4 mg  4 mg Intravenous Q6H PRN Salary, Montell D, MD      . oxyCODONE-acetaminophen (PERCOCET/ROXICET) 5-325 MG per tablet 2 tablet  2 tablet Oral Q6H PRN Salary, Montell D, MD      . pantoprazole (PROTONIX) EC tablet 40 mg  40 mg Oral Daily Salary, Montell D, MD      . polyethylene glycol (MIRALAX / GLYCOLAX) packet 17 g  17 g Oral Daily PRN Salary, Montell D, MD      . sodium chloride flush (NS) 0.9 % injection 3 mL  3 mL Intravenous Q12H Salary, Montell D, MD      . sodium chloride flush (NS) 0.9 % injection 3 mL  3 mL Intravenous PRN  Salary, Montell D, MD      . vancomycin (VANCOCIN) 1,750 mg in sodium chloride 0.9 % 500 mL IVPB  1,750 mg Intravenous Once Dallie Piles, RPH 250 mL/hr at 09/28/18 1552 1,750 mg at 09/28/18 1552  . [START ON 09/30/2018] vancomycin (VANCOCIN) IVPB 1000 mg/200 mL premix  1,000 mg Intravenous Q M,W,F-HD Dallie Piles, [facility]       Past Medical History:  Diagnosis Date  . Amputee, below knee, left ([place])   . Anemia   . Blood transfusion   . Bronchitis   . Chest pain   . CHF (congestive heart failure) ([place])   . Chronic kidney disease    dialysis, [facility]-F, [facility]  . Diabetes mellitus    type 2  . Dysrhythmia    irregular heartbeat  . ESRD (end stage renal disease) on dialysis ([place])    first HD 06/19/04  . GERD (gastroesophageal reflux disease)   . Hyperparathyroidism ([place])   . Morbid obesity ([place])    Past Surgical History:  Procedure Laterality Date  . AMPUTATION Left 01/20/2015   Procedure: Left Great Toe Amputation;  Surgeon: Newt Minion, MD;  Location: [place];  Service: Orthopedics;  Laterality: Left;  . AMPUTATION Left 03/07/2015   Procedure: Left Foot 1st and 2nd Ray Amputation;  Surgeon: Newt Minion, MD;  Location: [place];  Service: Orthopedics;  Laterality: Left;  . AMPUTATION Left 04/14/2015   Procedure: LEFT BELOW THE KNEE AMPUTATION ;  Surgeon: Newt Minion, MD;  Location: [place];  Service: Orthopedics;  Laterality: Left;  . ANKLE FUSION     rt foot charcot joint 2008 with refusion  . AV FISTULA PLACEMENT  10/11/2010  . AV FISTULA PLACEMENT  07/02/2011   Procedure: INSERTION OF ARTERIOVENOUS (AV) GORE-TEX GRAFT ARM;  Surgeon: Elam Dutch, MD;  Location: [place];  Service: Vascular;  Laterality: Left;  . AV FISTULA PLACEMENT Right 11/06/2012   Procedure: INSERTION OF ARTERIOVENOUS (AV) GORE-TEX GRAFT ARM;  Surgeon: Mal Misty, MD;  Location: [place];  Service: Vascular;  Laterality: Right;  . AV FISTULA PLACEMENT Right 03/21/2016   Procedure: INSERTION OF ARTERIOVENOUS (AV)  GORE-TEX GRAFT ARM ( LOOP FOREARM );  Surgeon: Algernon Huxley, MD;  Location: [facility];  Service: Vascular;  Laterality: Right;  . AV FISTULA PLACEMENT Left 11/07/2016   Procedure: INSERTION OF ARTERIOVENOUS (AV) Artegraft;  Surgeon: Algernon Huxley, MD;  Location: [facility];  Service: Vascular;  Laterality: Left;  . [place]t REMOVAL Right 11/06/2012   Procedure: REMOVAL OF ARTERIOVENOUS GORETEX GRAFT ([place]);  Surgeon: Mal Misty, MD;  Location: [place];  Service: Vascular;  Laterality: Right;  . ENDARTERECTOMY FEMORAL  11/07/2016   Procedure: ENDARTERECTOMY FEMORAL;  Surgeon: Algernon Huxley, MD;  Location: [facility];  Service: Vascular;;  . HERNIA REPAIR     Umbilical Hernia  . INSERTION OF DIALYSIS CATHETER  01/20/2012   Procedure: INSERTION OF DIALYSIS CATHETER;  Surgeon: Serafina Mitchell, MD;  Location: [place];  Service: Vascular;  Laterality: N/A;  insertion left internal jugular dialysis catheter 23cm  . INSERTION OF DIALYSIS CATHETER Right 11/09/2012   Procedure: INSERTION OF DIALYSIS CATHETER;  Surgeon: Conrad Anvik, MD;  Location: [place];  Service: Vascular;  Laterality: Right;  Right  Femoral Vein  . INSERTION OF DIALYSIS CATHETER N/A 08/18/2015   Procedure: INSERTION OF DIALYSIS CATHETER RIGHT FEMORAL VEIN;  Surgeon: Mal Misty, MD;  Location: [place];  Service: Cardiovascular;  Laterality: N/A;  . PARATHYROIDECTOMY    . PERIPHERAL VASCULAR CATHETERIZATION Right 04/04/2016   Procedure: A/V Shuntogram/Fistulagram;  Surgeon: Algernon Huxley, MD;  Location: [place] [facility];  Service: Cardiovascular;  Laterality: Right;  . REMOVAL OF A HERO DEVICE Left 08/14/2015   Procedure: REMOVAL OF LEFT ARM HERO DEVICE;  Surgeon: Rosetta Posner, MD;  Location: [place];  Service: Vascular;  Laterality: Left;  . TEE WITHOUT CARDIOVERSION N/A 08/18/2015   Procedure: TRANSESOPHAGEAL ECHOCARDIOGRAM (TEE);  Surgeon: Mal Misty, MD;  Location: [place];  Service: Cardiovascular;  Laterality: N/A;  . TEE WITHOUT CARDIOVERSION N/A  12/21/2015   Procedure: TRANSESOPHAGEAL ECHOCARDIOGRAM (TEE);  Surgeon: Dixie Dials, MD;  Location[facility] ENDOSCOPY;  Service: Cardiovascular;  Laterality: N/A;  . THROMBECTOMY    . Umbilcial Hernia    . UPPER EXTREMITY VENOGRAPHY Left 10/31/2016   Procedure: Upper Extremity Venography;  Surgeon: Algernon Huxley, MD;  Location: [place] [facility];  Service: Cardiovascular;  Laterality: Left;  Marland Kitchen VENOGRAM N/A 01/06/2014   Procedure: VENOGRAM;  Surgeon: Conrad Hendricks, MD;  Location: [facility] CATH LAB;  Service: Cardiovascular;  Laterality: N/A;   Social History Social History   Tobacco Use  . Smoking status: Current Every Day Smoker    Years: 20.00    Types: Cigarettes  . Smokeless tobacco: Never Used  Substance Use Topics  . Alcohol use: No    Alcohol/week: 0.0 standard drinks  . Drug use: Yes    Comment: oxycodone prescribed   Family History Family History  Problem Relation Age of Onset  . Heart disease Mother   . Kidney disease Mother   . Hypertension Mother   The patient denies any family history of peripheral artery disease, venous disease or bleeding/clotting disorders.  Allergies  Allergen Reactions  . Hydrocodone Nausea And Vomiting   REVIEW OF SYSTEMS (Negative unless checked)  Constitutional: [] Weight loss  [] Fever  [] Chills Cardiac: [] Chest pain   [] Chest pressure   [] Palpitations   [] Shortness of breath when laying flat   [] Shortness of breath at rest   [] Shortness of breath with exertion. Vascular:  [] Pain in legs with walking   [] Pain in legs at rest   [] Pain in legs when laying flat   [] Claudication   [] Pain in feet when walking  [] Pain in feet at rest  [] Pain in feet when laying flat   [] History of DVT   [] Phlebitis   [] Swelling in legs   [] Varicose veins   [] Non-healing ulcers Pulmonary:   [] Uses home oxygen   [] Productive cough   [] Hemoptysis   [] Wheeze  [] COPD   [] Asthma Neurologic:  [] Dizziness  [] Blackouts   []   Seizures   [] History of stroke   [] History of TIA  [] Aphasia    [] Temporary blindness   [] Dysphagia   [] Weakness or numbness in arms   [] Weakness or numbness in legs Musculoskeletal:  [] Arthritis   [] Joint swelling   [] Joint pain   [] Low back pain Hematologic:  [] Easy bruising  [] Easy bleeding   [] Hypercoagulable state   [x] Anemic  [] Hepatitis Gastrointestinal:  [] Blood in stool   [] Vomiting blood  [] Gastroesophageal reflux/heartburn   [] Difficulty swallowing. Genitourinary:  [x] Chronic kidney disease   [] Difficult urination  [] Frequent urination  [] Burning with urination   [] Blood in urine Skin:  [] Rashes   [x] Ulcers   [x] Wounds Psychological:  [] History of anxiety   []  History of major depression.  Physical Examination  Vitals:   09/28/18 1316 09/28/18 1530  BP: 119/67 (!) 109/42  Pulse: 99   Resp: 14 20  Temp: 98.4 F (36.9 C)   TempSrc: Oral   SpO2: 100%   Weight: 90.7 kg   Height: 6\' 1"  (1.854 m)    Body mass index is 26.39 kg/m. Gen:  WD/WN, NAD Head: [place]/AT, No temporalis wasting. Prominent temp pulse not noted. Ear/Nose/Throat: Hearing grossly intact, nares w/o erythema or drainage, oropharynx w/o Erythema/Exudate Eyes: Sclera non-icteric, conjunctiva clear Neck: Trachea midline.  No JVD.  Pulmonary:  Good air movement, respirations not labored, equal bilaterally.  Cardiac: RRR, normal S1, S2. Vascular:  Vessel Right Left  Radial Palpable Palpable  Ulnar Palpable Palpable  Brachial Palpable Palpable  Carotid Palpable, without bruit Palpable, without bruit  Aorta Not palpable N/A  Femoral Palpable Palpable  Popliteal Palpable Palpable  PT Palpable BKA  DP Palpable BKA   Left Lower Extremity: Thigh soft. Graft site is tender with minimal edema or erythema. Skin breakdown is noted toward to the lateral aspect of the graft. Graft is not visible. No drainage is noted. No foul odor is noted.   Gastrointestinal: soft, non-tender/non-distended. No guarding/reflex.  Musculoskeletal: M/S 5/5 throughout.   Neurologic: Sensation grossly  intact in extremities.  Symmetrical.  Speech is fluent. Motor exam as listed above. Psychiatric: Judgment intact, Mood & affect appropriate for pt's clinical situation. Dermatologic: No rashes or ulcers noted.  No cellulitis or open wounds. Lymph : No Cervical, Axillary, or Inguinal lymphadenopathy.  CBC Lab Results  Component Value Date   WBC 10.9 (H) 09/28/2018   HGB 8.4 (L) 09/28/2018   HCT 27.4 (L) 09/28/2018   MCV 93.5 09/28/2018   PLT 269 09/28/2018   BMET    Component Value Date/Time   NA 138 09/28/2018 1329   NA 135 (L) 02/10/2014 1319   K 4.2 09/28/2018 1329   K 4.6 02/17/2014 1353   CL 96 (L) 09/28/2018 1329   CL 99 02/10/2014 1319   CO2 24 09/28/2018 1329   CO2 25 02/10/2014 1319   GLUCOSE 163 (H) 09/28/2018 1329   GLUCOSE 205 (H) 02/10/2014 1319   BUN 86 (H) 09/28/2018 1329   BUN 40 (H) 02/10/2014 1319   CREATININE 12.75 (H) 09/28/2018 1329   CREATININE 11.58 (H) 02/10/2014 1319   CALCIUM 7.0 (L) 09/28/2018 1329   CALCIUM 8.4 (L) 02/10/2014 1319   GFRNONAA 4 (L) 09/28/2018 1329   GFRNONAA 5 (L) 02/10/2014 1319   GFRAA 5 (L) 09/28/2018 1329   GFRAA 5 (L) 02/10/2014 1319   Estimated Creatinine Clearance: 7.4 mL/min (A) (by C-G formula based on SCr of 12.75 mg/dL (H)).  COAG Lab Results  Component Value Date   INR 1.10 11/05/2016  INR 1.09 03/21/2016   INR 1.16 04/14/2015   Radiology No results found.  Assessment/Plan The patient is a 53 year old male with a past medical history of morbid obesity, hyperparathyroidism, GERD, end-stage renal disease on chronic hemodialysis, dysrhythmia, diabetes, congestive heart failure, anemia, below the knee amputation, PVD who presented to the [facility] emergency department from his dialysis center today with a chief complaint of a "graft infection". 1. ESRD with Graft Skin Breakdown: Patient with a left thigh graft with skin breakdown.  The patient's graft is not visible. The patient was able  to successfully dialyze past Friday.  Potassium today is 4.2. At dialysis today, staff noted his wound and he was sent to the [facility] emergency department for further treatment.  We will stop dialyzing from the graft at this time due to skin breakdown.  We will plan on a temporary dialysis catheter insertion tomorrow with Dr. Delana Meyer.  We will plan on a left thigh shuntogram with Dr. Lucky Cowboy on Wednesday to assess the graft.  Cultures were taken and sent from the emergency department.  If positive and depending on the results of the shuntogram may need possible resection/revision. 2. BKA: Stump is healthy and the patient continues to use his prosthetic for ambulation. 3.  Calciphylaxis wounds: We will consult wound care nurse for further recommendations.  Discussed with Dr. Mayme Genta, PA-C  09/28/2018 4:49 PM  This note was created with Dragon medical transcription system.  Any error is purely unintentional.

## 2018-09-28 NOTE — Progress Notes (Signed)
PHARMACY NOTE:  ANTIMICROBIAL RENAL DOSAGE ADJUSTMENT  Current antimicrobial regimen includes a mismatch between antimicrobial dosage and estimated renal function.  As per policy approved by the Pharmacy & Therapeutics and Medical Executive Committees, the antimicrobial dosage will be adjusted accordingly.  Current antimicrobial dosage:  Cefepime 1 gm IV Q12H   Indication: Wound infection   Renal Function:  Estimated Creatinine Clearance: 7.4 mL/min (A) (by C-G formula based on SCr of 12.75 mg/dL (H)). [x]      On intermittent HD, scheduled: []      On CRRT    Antimicrobial dosage has been changed to:  Cefepime 2 gm IV Q MWF - HD to be given following each HD session.   First dose to start 3/16 @ 2200.   Additional comments:   Thank you for allowing pharmacy to be a part of this patient's care.  Robyn Nohr D, [facility] 09/28/2018 4:32 PM

## 2018-09-28 NOTE — ED Notes (Signed)
unable to obtain urine, patient reports does not make urinf

## 2018-09-28 NOTE — H&P (Signed)
[facility] NAME: [name]    MR#:  [id number]  DATE OF BIRTH:  27-Oct-1965  DATE OF ADMISSION:  09/28/2018  PRIMARY CARE PHYSICIAN: Sandi Mariscal, MD   REQUESTING/REFERRING PHYSICIAN:   CHIEF COMPLAINT:   Chief Complaint  Patient presents with  . Vascular Access Problem    HISTORY OF PRESENT ILLNESS: [name]  is a 53 y.o. male with a known history per below which includes end-stage renal disease on hemodialysis Monday/Wednesday/Fridays, sent from dialysis clinic today given left groin AV fistula infection with ulceration which is draining, patient complaining of left thigh pain, white count 10.9, hemoglobin 8.4, creatinine 12, ED attending did discuss case with Dr. Holley Raring and vascular surgery/Dr. dew-planning for temporary hemodialysis catheter placement, patient is now been admitted for acute left groin infected AV fistula.  PAST MEDICAL HISTORY:   Past Medical History:  Diagnosis Date  . Amputee, below knee, left ([place])   . Anemia   . Blood transfusion   . Bronchitis   . Chest pain   . CHF (congestive heart failure) ([place])   . Chronic kidney disease    dialysis, M[facility]W-F, [facility]  . Diabetes mellitus    type 2  . Dysrhythmia    irregular heartbeat  . ESRD (end stage renal disease) on dialysis ([place])    first HD 06/19/04  . GERD (gastroesophageal reflux disease)   . Hyperparathyroidism ([place])   . Morbid obesity ([place])     PAST SURGICAL HISTORY:  Past Surgical History:  Procedure Laterality Date  . AMPUTATION Left 01/20/2015   Procedure: Left Great Toe Amputation;  Surgeon: Newt Minion, MD;  Location: [facility];  Service: Orthopedics;  Laterality: Left;  . AMPUTATION Left 03/07/2015   Procedure: Left Foot 1st and 2nd Ray Amputation;  Surgeon: Newt Minion, MD;  Location: [facility];  Service: Orthopedics;  Laterality: Left;  . AMPUTATION Left 04/14/2015   Procedure: LEFT BELOW THE KNEE AMPUTATION ;  Surgeon: Newt Minion,  MD;  Location: [facility];  Service: Orthopedics;  Laterality: Left;  . ANKLE FUSION     rt foot charcot joint 2008 with refusion  . AV FISTULA PLACEMENT  10/11/2010  . AV FISTULA PLACEMENT  07/02/2011   Procedure: INSERTION OF ARTERIOVENOUS (AV) GORE-TEX GRAFT ARM;  Surgeon: Elam Dutch, MD;  Location: [facility];  Service: Vascular;  Laterality: Left;  . AV FISTULA PLACEMENT Right 11/06/2012   Procedure: INSERTION OF ARTERIOVENOUS (AV) GORE-TEX GRAFT ARM;  Surgeon: Mal Misty, MD;  Location: [facility];  Service: Vascular;  Laterality: Right;  . AV FISTULA PLACEMENT Right 03/21/2016   Procedure: INSERTION OF ARTERIOVENOUS (AV) GORE-TEX GRAFT ARM ( LOOP FOREARM );  Surgeon: Algernon Huxley, MD;  Location: [facility];  Service: Vascular;  Laterality: Right;  . AV FISTULA PLACEMENT Left 11/07/2016   Procedure: INSERTION OF ARTERIOVENOUS (AV) Artegraft;  Surgeon: Algernon Huxley, MD;  Location: [facility];  Service: Vascular;  Laterality: Left;  . [place] REMOVAL Right 11/06/2012   Procedure: REMOVAL OF ARTERIOVENOUS GORETEX GRAFT ([place]);  Surgeon: Mal Misty, MD;  Location: [facility];  Service: Vascular;  Laterality: Right;  . ENDARTERECTOMY FEMORAL  11/07/2016   Procedure: ENDARTERECTOMY FEMORAL;  Surgeon: Algernon Huxley, MD;  Location: [facility];  Service: Vascular;;  . HERNIA REPAIR     Umbilical Hernia  . INSERTION OF DIALYSIS CATHETER  01/20/2012   Procedure: INSERTION OF DIALYSIS CATHETER;  Surgeon: Butch Penny  Trula Slade, MD;  Location: [facility];  Service: Vascular;  Laterality: N/A;  insertion left internal jugular dialysis catheter 23cm  . INSERTION OF DIALYSIS CATHETER Right 11/09/2012   Procedure: INSERTION OF DIALYSIS CATHETER;  Surgeon: Conrad Rush, MD;  Location: [place];  Service: Vascular;  Laterality: Right;  Right  Femoral Vein  . INSERTION OF DIALYSIS CATHETER N/A 08/18/2015   Procedure: INSERTION OF DIALYSIS CATHETER RIGHT FEMORAL VEIN;  Surgeon: Mal Misty, MD;  Location: [facility];  Service: Cardiovascular;  Laterality:  N/A;  . PARATHYROIDECTOMY    . PERIPHERAL VASCULAR CATHETERIZATION Right 04/04/2016   Procedure: A/V Shuntogram/Fistulagram;  Surgeon: Algernon Huxley, MD;  Location: [facility];  Service: Cardiovascular;  Laterality: Right;  . REMOVAL OF A HERO DEVICE Left 08/14/2015   Procedure: REMOVAL OF LEFT ARM HERO DEVICE;  Surgeon: Rosetta Posner, MD;  Location: [facility];  Service: Vascular;  Laterality: Left;  . TEE WITHOUT CARDIOVERSION N/A 08/18/2015   Procedure: TRANSESOPHAGEAL ECHOCARDIOGRAM (TEE);  Surgeon: Mal Misty, MD;  Location: [facility];  Service: Cardiovascular;  Laterality: N/A;  . TEE WITHOUT CARDIOVERSION N/A 12/21/2015   Procedure: TRANSESOPHAGEAL ECHOCARDIOGRAM (TEE);  Surgeon: Dixie Dials, MD;  Location: [facility];  Service: Cardiovascular;  Laterality: N/A;  . THROMBECTOMY    . Umbilcial Hernia    . UPPER EXTREMITY VENOGRAPHY Left 10/31/2016   Procedure: Upper Extremity Venography;  Surgeon: Algernon Huxley, MD;  Location: [facility];  Service: Cardiovascular;  Laterality: Left;  Mar[place] Kitchen VENOGRAM N/A 01/06/2014   Procedure: VENOGRAM;  Surgeon: Conrad Wainwright, MD;  Location: [facility];  Service: Cardiovascular;  Laterality: N/A;    SOCIAL HISTORY:  Social History   Tobacco Use  . Smoking status: Current Every Day Smoker    Years: 20.00    Types: Cigarettes  . Smokeless tobacco: Never Used  Substance Use Topics  . Alcohol use: No    Alcohol/week: 0.0 standard drinks    FAMILY HISTORY:  Family History  Problem Relation Age of Onset  . Heart disease Mother   . Kidney disease Mother   . Hypertension Mother     DRUG ALLERGIES:  Allergies  Allergen Reactions  . Hydrocodone Nausea And Vomiting    REVIEW OF SYSTEMS:   CONSTITUTIONAL: No fever, fatigue or weakness.  EYES: No blurred or double vision.  EARS, NOSE, AND THROAT: No tinnitus or ear pain.  RESPIRATORY: No cough, shortness of breath, wheezing or hemoptysis.  CARDIOVASCULAR: No chest pain, orthopnea, edema.   GASTROINTESTINAL: No nausea, vomiting, diarrhea or abdominal pain.  GENITOURINARY: No dysuria, hematuria.  ENDOCRINE: No polyuria, nocturia,  HEMATOLOGY: No anemia, easy bruising or bleeding SKIN: Left groin AV fistula infection with skin ulceration MUSCULOSKELETAL: No joint pain or arthritis.   NEUROLOGIC: No tingling, numbness, weakness.  PSYCHIATRY: No anxiety or depression.   MEDICATIONS AT HOME:  Prior to Admission medications   Medication Sig Start Date End Date Taking? Authorizing Provider  acetaminophen (TYLENOL) 500 MG tablet Take 1,000 mg by mouth as needed for moderate pain (takes everytime with dialysis).    [provider]  aspirin EC 81 MG EC tablet Take 1 tablet (81 mg total) by mouth daily. 08/19/15   Burns, Arloa Koh, MD  AURYXIA 1 GM 210 MG(Fe) tablet Take 1 tablet by mouth 3 (three) times daily with meals. 10/24/16   [provider]  b complex-vitamin c-folic acid (NEPHRO-VITE) 0.8 MG TABS tablet Take 1 tablet by mouth at bedtime. 04/26/15  Angiulli, Lavon Paganini, PA-C  clindamycin (CLEOCIN) 300 MG capsule Take 1 capsule (300 mg total) by mouth 4 (four) times daily. X 7 days 08/29/18   Veryl Speak, MD  cyclobenzaprine (FLEXERIL) 5 MG tablet Take 5 mg by mouth as needed for muscle spasms.    [provider]  Dulaglutide (TRULICITY) 4.09 WJ/1.9JY SOPN Inject 0.75 mg into the skin every Friday.     [provider]  ibuprofen (ADVIL,MOTRIN) 200 MG tablet Take 400 mg by mouth every 6 (six) hours as needed for moderate pain.    [provider]  insulin detemir (LEVEMIR) 100 UNIT/ML injection Inject 0.2 mLs (20 Units total) into the skin at bedtime. Patient taking differently: Inject 5-20 Units into the skin at bedtime. Per sliding scale 04/26/15   Angiulli, Lavon Paganini, PA-C  NOVOLOG FLEXPEN 100 UNIT/ML FlexPen 2-3 Units as needed for high blood sugar. Per sliding scale 12/04/16   [provider]  oxyCODONE-acetaminophen  (PERCOCET/ROXICET) 5-325 MG tablet Take 1 tablet by mouth every 6 (six) hours as needed for severe pain. Patient taking differently: Take 2 tablets by mouth every 6 (six) hours as needed for severe pain (leg pain).  12/03/16   Lawyer, Harrell Gave, PA-C  pantoprazole (PROTONIX) 40 MG tablet Take 40 mg by mouth daily. 03/30/18   [provider]  VENTOLIN HFA 108 (90 Base) MCG/ACT inhaler Inhale 2 puffs into the lungs as needed for wheezing.  11/06/16   [provider]      PHYSICAL EXAMINATION:   VITAL SIGNS: Blood pressure 119/67, pulse 99, temperature 98.4 F (36.9 C), temperature source Oral, resp. rate 14, height 6\' 1"  (1.854 m), weight 90.7 kg, SpO2 100 %.  GENERAL:  53 y.o.-year-old patient lying in the bed with no acute distress.  EYES: Pupils equal, round, reactive to light and accommodation. No scleral icterus. Extraocular muscles intact.  HEENT: Head atraumatic, normocephalic. Oropharynx and nasopharynx clear.  NECK:  Supple, no jugular venous distention. No thyroid enlargement, no tenderness.  LUNGS: Normal breath sounds bilaterally, no wheezing, rales,rhonchi or crepitation. No use of accessory muscles of respiration.  CARDIOVASCULAR: S1, S2 normal. No murmurs, rubs, or gallops.  ABDOMEN: Soft, nontender, nondistended. Bowel sounds present. No organomegaly or mass.  EXTREMITIES: No pedal edema, cyanosis, or clubbing.  NEUROLOGIC: Cranial nerves II through XII are intact. Muscle strength 5/5 in all extremities. Sensation intact. Gait not checked.  PSYCHIATRIC: The patient is alert and oriented x 3.  SKIN: Left infected AV fistula, associated ulceration with drainage, calciphylaxis scarring  LABORATORY PANEL:   CBC Recent Labs  Lab 09/28/18 1329  WBC 10.9*  HGB 8.4*  HCT 27.4*  PLT 269  MCV 93.5  MCH 28.7  MCHC 30.7  RDW 16.7*  LYMPHSABS 1.1  MONOABS 0.8  EOSABS 0.2  BASOSABS 0.1    ------------------------------------------------------------------------------------------------------------------  Chemistries  Recent Labs  Lab 09/28/18 1329  NA 138  K 4.2  CL 96*  CO2 24  GLUCOSE 163*  BUN 86*  CREATININE 12.75*  CALCIUM 7.0*  AST 16  ALT 14  ALKPHOS 60  BILITOT 0.5   ------------------------------------------------------------------------------------------------------------------ estimated creatinine clearance is 7.4 mL/min (A) (by C-G formula based on SCr of 12.75 mg/dL (H)). ------------------------------------------------------------------------------------------------------------------ No results for input(s): TSH, T4TOTAL, T3FREE, THYROIDAB in the last 72 hours.  Invalid input(s): FREET3   Coagulation profile No results for input(s): INR, PROTIME in the last 168 hours. ------------------------------------------------------------------------------------------------------------------- No results for input(s): DDIMER in the last 72 hours. -------------------------------------------------------------------------------------------------------------------  Cardiac Enzymes No results for  input(s): CKMB, TROPONINI, MYOGLOBIN in the last 168 hours.  Invalid input(s): CK ------------------------------------------------------------------------------------------------------------------ Invalid input(s): POCBNP  ---------------------------------------------------------------------------------------------------------------  Urinalysis No results found for: COLORURINE, APPEARANCEUR, LABSPEC, PHURINE, GLUCOSEU, HGBUR, BILIRUBINUR, KETONESUR, PROTEINUR, UROBILINOGEN, NITRITE, LEUKOCYTESUR   RADIOLOGY: No results found.  EKG: Orders placed or performed during the hospital encounter of 09/28/18  . ED EKG  . ED EKG    IMPRESSION AND PLAN: *Acute infected left groin AV fistula with ulceration and drainage Admit to regular nursing for bed, continue  vancomycin/cefepime, follow-up on cultures, vascular surgery/Dr. dew consulted for temporary hemodialysis catheter placement, n.p.o. 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 [place] Raring consulted for hemodialysis needs  *Chronic diabetes mellitus type 2 Sliding scale insulin with Accu-Cheks per routine  *Chronic diastolic CHF without exacerbation Aspirin on hold for procedure, strict I&O monitoring, daily weights  *Chronic GERD PPI daily  All the records are reviewed and case discussed with ED provider. Management plans discussed with the patient, family and they are in agreement.  CODE STATUS:full Code Status History    Date Active Date Inactive Code Status Order ID Comments User Context   10/31/2016 1115 10/31/2016 1500 Full Code [id number]  Algernon Huxley, MD Inpatient   04/04/2016 1256 04/04/2016 1917 Full Code [id number]  Algernon Huxley, MD Inpatient   09/12/2015 0518 09/12/2015 2140 Full Code [id number]  Bethena Roys, MD Inpatient   08/11/2015 1801 08/19/2015 1633 Full Code [id number]  Bethena Roys, MD Inpatient   04/17/2015 1828 04/26/2015 1308 Full Code [id number]  Cathlyn Parsons, PA-C Inpatient   04/17/2015 1828 04/17/2015 1828 Full Code [id number]  Cathlyn Parsons, PA-C Inpatient   04/14/2015 1636 04/17/2015 1828 Full Code [id number]  Newt Minion, MD Inpatient   01/06/2014 0805 01/06/2014 1210 Full Code [id number]  Conrad [place]dale, MD Inpatient       TOTAL TIME TAKING CARE OF THIS PATIENT: 40 minutes.    Avel Peace Kabria Hetzer M.D on 09/28/2018   Between 7am to 6pm - Pager - [phone]  After 6pm go to [email or web address] - password [email or web address] [facility]  Office  [phone]  CC: Primary care physician; Sandi Mariscal, MD   Note: This dictation was prepared with Dragon dictation along with smaller phrase technology. Any transcriptional errors that result from this process are  unintentional.

## 2018-09-28 NOTE — ED Provider Notes (Signed)
[facility] ([place] Emergency Department Provider Note  ____________________________________________  Time seen: Approximately 2:09 PM  I have reviewed the triage vital signs and the nursing notes.   HISTORY  Chief Complaint Vascular Access Problem   HPI [name] is a 53 y.o. male with a history of end-stage renal disease on dialysis, CHF, left BKA, morbidly obesity, diabetes who presents for evaluation of dialysis graft ulcer and possible infection.  Patient reports a week of progressively worsening ulceration on top of his dialysis fistula located in his left thigh.  He has noted purulent discharge coming from it.  On Friday they were able to dialyze him however today they refused and sent him over to the emergency room for evaluation.  He is complaining of constant severe sharp pain that radiates down his leg.  He has had no nausea, vomiting, fever or chills.    Past Medical History:  Diagnosis Date  . Amputee, below knee, left ([place])   . Anemia   . Blood transfusion   . Bronchitis   . Chest pain   . CHF (congestive heart failure) ([place])   . Chronic kidney disease    dialysis, M-W-F, [facility]  . Diabetes mellitus    type 2  . Dysrhythmia    irregular heartbeat  . ESRD (end stage renal disease) on dialysis ([place])    first HD 06/19/04  . GERD (gastroesophageal reflux disease)   . Hyperparathyroidism ([place])   . Morbid obesity [facility])     Patient Active Problem List   Diagnosis Date Noted  . Infection of AV graft for dialysis ([place]) 09/28/2018  . Postop check 12/05/2016  . Status post ankle fusion 05/23/2016  . Acute chest pain 09/12/2015  . Chest pain 09/12/2015  . Septic shock ([place])   . MRSA bacteremia   . Esophageal reflux   . Infection and inflammatory reaction due to cardiac device, implant, and graft ([place])   . Diabetes mellitus type 2, uncontrolled ([place]) 04/18/2015  . Status post below knee amputation of left lower extremity 04/17/2015  .  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 ESRD on dialysis ([place]) 10/23/2012  . DIASTOLIC DYSFUNCTION 13/02/6577  . PVD 10/22/2007  . ESRD 10/22/2007  . HYPERPARATHYROIDISM, HX OF 10/22/2007    Past Surgical History:  Procedure Laterality Date  . AMPUTATION Left 01/20/2015   Procedure: Left Great Toe Amputation;  Surgeon: Newt Minion, MD;  Location: [place];  Service: Orthopedics;  Laterality: Left;  . AMPUTATION Left 03/07/2015   Procedure: Left Foot 1st and 2nd Ray Amputation;  Surgeon: Newt Minion, MD;  Location: [place];  Service: Orthopedics;  Laterality: Left;  . AMPUTATION Left 04/14/2015   Procedure: LEFT BELOW THE KNEE AMPUTATION ;  Surgeon: Newt Minion, MD;  Location: [place];  Service: Orthopedics;  Laterality: Left;  . ANKLE FUSION     rt foot charcot joint 2008 with refusion  . AV FISTULA PLACEMENT  10/11/2010  . AV FISTULA PLACEMENT  07/02/2011   Procedure: INSERTION OF ARTERIOVENOUS (AV) GORE-TEX GRAFT ARM;  Surgeon: Elam Dutch, MD;  Location: [place];  Service: Vascular;  Laterality: Left;  . AV FISTULA PLACEMENT Right 11/06/2012   Procedure: INSERTION OF ARTERIOVENOUS (AV) GORE-TEX GRAFT ARM;  Surgeon: Mal Misty, MD;  Location: [place];  Service: Vascular;  Laterality: Right;  . AV FISTULA PLACEMENT Right 03/21/2016   Procedure: INSERTION OF ARTERIOVENOUS (AV) GORE-TEX GRAFT ARM ( LOOP FOREARM );  Surgeon: Algernon Huxley, MD;  Location: [facility];  Service: Vascular;  Laterality: Right;  . AV FISTULA PLACEMENT Left 11/07/2016   Procedure: INSERTION OF ARTERIOVENOUS (AV) Artegraft;  Surgeon: Algernon Huxley, MD;  Location: [facility];  Service: Vascular;  Laterality: Left;  . [place] REMOVAL Right 11/06/2012   Procedure: REMOVAL OF ARTERIOVENOUS GORETEX GRAFT ([place]);  Surgeon: Mal Misty, MD;  Location: [place];  Service: Vascular;  Laterality: Right;  . ENDARTERECTOMY FEMORAL  11/07/2016   Procedure: ENDARTERECTOMY FEMORAL;  Surgeon:  Algernon Huxley, MD;  Location: [facility];  Service: Vascular;;  . HERNIA REPAIR     Umbilical Hernia  . INSERTION OF DIALYSIS CATHETER  01/20/2012   Procedure: INSERTION OF DIALYSIS CATHETER;  Surgeon: Serafina Mitchell, MD;  Location: [place];  Service: Vascular;  Laterality: N/A;  insertion left internal jugular dialysis catheter 23cm  . INSERTION OF DIALYSIS CATHETER Right 11/09/2012   Procedure: INSERTION OF DIALYSIS CATHETER;  Surgeon: Conrad Mossyrock, MD;  Location: [place];  Service: Vascular;  Laterality: Right;  Right  Femoral Vein  . INSERTION OF DIALYSIS CATHETER N/A 08/18/2015   Procedure: INSERTION OF DIALYSIS CATHETER RIGHT FEMORAL VEIN;  Surgeon: Mal Misty, MD;  Location: [place];  Service: Cardiovascular;  Laterality: N/A;  . PARATHYROIDECTOMY    . PERIPHERAL VASCULAR CATHETERIZATION Right 04/04/2016   Procedure: A/V Shuntogram/Fistulagram;  Surgeon: Algernon Huxley, MD;  Location: [place] [facility];  Service: Cardiovascular;  Laterality: Right;  . REMOVAL OF A HERO DEVICE Left 08/14/2015   Procedure: REMOVAL OF LEFT ARM HERO DEVICE;  Surgeon: Rosetta Posner, MD;  Location: [place];  Service: Vascular;  Laterality: Left;  . TEE WITHOUT CARDIOVERSION N/A 08/18/2015   Procedure: TRANSESOPHAGEAL ECHOCARDIOGRAM (TEE);  Surgeon: Mal Misty, MD;  Location: [place];  Service: Cardiovascular;  Laterality: N/A;  . TEE WITHOUT CARDIOVERSION N/A 12/21/2015   Procedure: TRANSESOPHAGEAL ECHOCARDIOGRAM (TEE);  Surgeon: Dixie Dials, MD;  Location: [facility]OSCOPY;  Service: Cardiovascular;  Laterality: N/A;  . THROMBECTOMY    . Umbilcial Hernia    . UPPER EXTREMITY VENOGRAPHY Left 10/31/2016   Procedure: Upper Extremity Venography;  Surgeon: Algernon Huxley, MD;  Location: [place] [facility];  Service: Cardiovascular;  Laterality: Left;  Marland Kitchen VENOGRAM N/A 01/06/2014   Procedure: VENOGRAM;  Surgeon: Conrad , MD;  Location: [facility] CATH LAB;  Service: Cardiovascular;  Laterality: N/A;    Prior to Admission medications    Medication Sig Start Date End Date Taking? Authorizing Provider  acetaminophen (TYLENOL) 500 MG tablet Take 1,000 mg by mouth as needed for moderate pain (takes everytime with dialysis).    [provider]  aspirin EC 81 MG EC tablet Take 1 tablet (81 mg total) by mouth daily. 08/19/15   Burns, Arloa Koh, MD  AURYXIA 1 GM 210 MG(Fe) tablet Take 1 tablet by mouth 3 (three) times daily with meals. 10/24/16   [provider]  b complex-vitamin c-folic acid (NEPHRO-VITE) 0.8 MG TABS tablet Take 1 tablet by mouth at bedtime. 04/26/15   Angiulli, Lavon Paganini, PA-C  cyclobenzaprine (FLEXERIL) 5 MG tablet Take 5 mg by mouth as needed for muscle spasms.    [provider]  Dulaglutide (TRULICITY) 8.67 YP/9.5KD SOPN Inject 0.75 mg into the skin every Friday.     [provider]  ibuprofen (ADVIL,MOTRIN) 200 MG tablet Take 400 mg by mouth every 6 (six) hours as needed for moderate pain.    [provider]  insulin detemir (LEVEMIR) 100 UNIT/ML injection Inject 0.2 mLs (20 Units total) into the skin at bedtime. Patient taking differently: Inject 5-20 Units into the skin at bedtime. Per sliding scale 04/26/15   Angiulli, Lavon Paganini, PA-C  NOVOLOG FLEXPEN 100 UNIT/ML FlexPen 2-3 Units as needed for high blood sugar. Per sliding scale 12/04/16   [provider]  oxyCODONE-acetaminophen (PERCOCET/ROXICET) 5-325 MG tablet Take 1 tablet by mouth every 6 (six) hours as needed for severe pain. Patient taking differently: Take 2 tablets by mouth every 6 (six) hours as needed for severe pain (leg pain).  12/03/16   Lawyer, Harrell Gave, PA-C  pantoprazole (PROTONIX) 40 MG tablet Take 40 mg by mouth daily. 03/30/18   [provider]  VENTOLIN HFA 108 (90 Base) MCG/ACT inhaler Inhale 2 puffs into the lungs as needed for wheezing.  11/06/16   [provider]    Allergies Hydrocodone  Family History  Problem Relation Age of Onset  . Heart disease Mother   .  Kidney disease Mother   . Hypertension Mother     Social History Social History   Tobacco Use  . Smoking status: Current Every Day Smoker    Years: 20.00    Types: Cigarettes  . Smokeless tobacco: Never Used  Substance Use Topics  . Alcohol use: No    Alcohol/week: 0.0 standard drinks  . Drug use: Yes    Comment: oxycodone prescribed    Review of Systems  Constitutional: Negative for fever. Eyes: Negative for visual changes. ENT: Negative for sore throat. Neck: No neck pain  Cardiovascular: Negative for chest pain. + ulcer over dialysis graft Respiratory: Negative for shortness of breath. Gastrointestinal: Negative for abdominal pain, vomiting or diarrhea. Genitourinary: Negative for dysuria. Musculoskeletal: Negative for back pain. Skin: Negative for rash. Neurological: Negative for headaches, weakness or numbness. Psych: No SI or HI  ____________________________________________   PHYSICAL EXAM:  VITAL SIGNS: ED Triage Vitals [09/28/18 1316]  Enc Vitals Group     BP 119/67     Pulse Rate 99     Resp 14     Temp 98.4 F (36.9 C)     Temp Source Oral     SpO2 100 %     Weight 200 lb (90.7 kg)     Height 6\' 1"  (1.854 m)     Head Circumference      Peak Flow      Pain Score 10     Pain Loc      Pain Edu?      Excl. in [place]?     Constitutional: Alert and oriented. Well appearing and in no apparent distress. HEENT:      Head: Normocephalic and atraumatic.         Eyes: Conjunctivae are normal. Sclera is non-icteric.       Mouth/Throat: Mucous membranes are moist.       Neck: Supple with no signs of meningismus. Cardiovascular: Regular rate and rhythm. No murmurs, gallops, or rubs. 2+ symmetrical distal pulses are present in all extremities. No JVD. Respiratory: Normal respiratory effort. Lungs are clear to auscultation bilaterally. No wheezes, crackles, or rhonchi.  Gastrointestinal: Soft, non tender, and non distended with positive bowel sounds. No rebound  or guarding. Musculoskeletal: Nontender with normal range of motion in all extremities. No edema, cyanosis, or erythema of extremities. Neurologic: Normal speech and language. Face is symmetric. Moving all extremities. No gross focal neurologic deficits are appreciated. Skin: There is a ulcer located over the dialysis  graft on the LLE with no crepitus, necrosis, surrounding erythema or warmth, or purulent discharge Psychiatric: Mood and affect are normal. Speech and behavior are normal.  ____________________________________________   LABS (all labs ordered are listed, but only abnormal results are displayed)  Labs Reviewed  COMPREHENSIVE METABOLIC PANEL - Abnormal; Notable for the following components:      Result Value   Chloride 96 (*)    Glucose, Bld 163 (*)    BUN 86 (*)    Creatinine, Ser 12.75 (*)    Calcium 7.0 (*)    Albumin 3.3 (*)    GFR calc non Af Amer 4 (*)    GFR calc Af Amer 5 (*)    Anion gap 18 (*)    All other components within normal limits  CBC WITH DIFFERENTIAL/PLATELET - Abnormal; Notable for the following components:   WBC 10.9 (*)    RBC 2.93 (*)    Hemoglobin 8.4 (*)    HCT 27.4 (*)    RDW 16.7 (*)    Neutro Abs 8.6 (*)    All other components within normal limits  CULTURE, BLOOD (ROUTINE X 2)  CULTURE, BLOOD (ROUTINE X 2)  LACTIC ACID, PLASMA  URINALYSIS, COMPLETE (UACMP) WITH MICROSCOPIC   ____________________________________________  EKG  ED ECG REPORT I, Rudene Re, the attending physician, personally viewed and interpreted this ECG.  Normal sinus rhythm, rate of 98, prolonged QTC, borderline right axis deviation, no ST elevations or depressions, Q waves in anterior and lateral leads. ____________________________________________  RADIOLOGY  none  ____________________________________________   PROCEDURES  Procedure(s) performed: None Procedures Critical Care performed:  None ____________________________________________    INITIAL IMPRESSION / ASSESSMENT AND PLAN / ED COURSE   53 y.o. male with a history of end-stage renal disease on dialysis, CHF, left BKA, morbidly obesity, diabetes who presents for evaluation of dialysis graft ulcer and possible infection.  Patient with an ulceration seen above the dialysis graft on his left lower extremity, no obvious signs of cellulitis, necrotizing infection, purulent discharge, or abscess.  Discussed with Dr. Lucky Cowboy from vascular surgery who recommended admission for IV antibiotics, removal of the graft, and placement of a temporary dialysis catheter. No signs of sepsis at this time. Labs showing worsening anemia with a hemoglobin of 8.4 (last checked 2 weeks ago hemoglobin was 11.4).  Patient denies any known active bleeding, no melena he is not on blood thinners.  He is otherwise hemodynamically stable.  Part of this could be hemodilution in the setting of missed dialysis.  No indication for emergent dialysis. Discussed with Dr. Holley Raring. Will check lactic acid and blood cultures, will give vancomycin and Zosyn.  Will discuss with the hospitalist for admission.     As part of my medical decision making, I reviewed the following data within the [place] notes reviewed and incorporated, Labs reviewed , EKG interpreted , Old EKG reviewed, Old chart reviewed, Discussed with admitting physician , A consult was requested and obtained from this/these consultant(s) Vascular surgery, Notes from prior ED visits and [place] Controlled Substance Database    Pertinent labs & imaging results that were available during my care of the patient were reviewed by me and considered in my medical decision making (see chart for details).    ____________________________________________   FINAL CLINICAL IMPRESSION(S) / ED DIAGNOSES  Final diagnoses:  Infection of AV graft for dialysis ([place])  Increased anion gap metabolic acidosis      NEW MEDICATIONS STARTED DURING THIS  VISIT:  ED Discharge Orders    None       Note:  This document was prepared using Dragon voice recognition software and may include unintentional dictation errors.    Alfred Levins, [place], MD 09/28/18 [phone]

## 2018-09-29 ENCOUNTER — Inpatient Hospital Stay
Admission: EM | Disposition: A | Discharge status: Home Health | Payer: Self-pay | Source: Home / Self Care | Attending: Internal Medicine

## 2018-09-29 ENCOUNTER — Other Ambulatory Visit (INDEPENDENT_AMBULATORY_CARE_PROVIDER_SITE_OTHER): Payer: Self-pay | Admitting: Vascular Surgery

## 2018-09-29 DIAGNOSIS — N186 End stage renal disease: Secondary | ICD-10-CM

## 2018-09-29 DIAGNOSIS — Z992 Dependence on renal dialysis: Secondary | ICD-10-CM

## 2018-09-29 DIAGNOSIS — T82898A Other specified complication of vascular prosthetic devices, implants and grafts, initial encounter: Secondary | ICD-10-CM

## 2018-09-29 HISTORY — PX: TEMPORARY DIALYSIS CATHETER: CATH118312

## 2018-09-29 LAB — GLUCOSE, CAPILLARY
Glucose-Capillary: 137 mg/dL — ABNORMAL HIGH (ref 70–99)
Glucose-Capillary: 151 mg/dL — ABNORMAL HIGH (ref 70–99)
Glucose-Capillary: 153 mg/dL — ABNORMAL HIGH (ref 70–99)

## 2018-09-29 LAB — BASIC METABOLIC PANEL
ANION GAP: 17 — AB (ref 5–15)
BUN: 90 mg/dL — ABNORMAL HIGH (ref 6–20)
CO2: 22 mmol/L (ref 22–32)
Calcium: 7.1 mg/dL — ABNORMAL LOW (ref 8.9–10.3)
Chloride: 99 mmol/L (ref 98–111)
Creatinine, Ser: 13.81 mg/dL — ABNORMAL HIGH (ref 0.61–1.24)
GFR calc Af Amer: 4 mL/min — ABNORMAL LOW (ref >60)
GFR calc non Af Amer: 4 mL/min — ABNORMAL LOW (ref >60)
Glucose, Bld: 146 mg/dL — ABNORMAL HIGH (ref 70–99)
POTASSIUM: 4.4 mmol/L (ref 3.5–5.1)
Sodium: 138 mmol/L (ref 135–145)

## 2018-09-29 LAB — CBC
HCT: 24.7 % — ABNORMAL LOW (ref 39.0–52.0)
HEMOGLOBIN: 7.5 g/dL — AB (ref 13.0–17.0)
MCH: 28.5 pg (ref 26.0–34.0)
MCHC: 30.4 g/dL (ref 30.0–36.0)
MCV: 93.9 fL (ref 80.0–100.0)
Platelets: 248 10*3/uL (ref 150–400)
RBC: 2.63 MIL/uL — ABNORMAL LOW (ref 4.22–5.81)
RDW: 16.7 % — ABNORMAL HIGH (ref 11.5–15.5)
WBC: 8.6 10*3/uL (ref 4.0–10.5)
nRBC: 0 % (ref 0.0–0.2)

## 2018-09-29 LAB — PHOSPHORUS: Phosphorus: 7.9 mg/dL — ABNORMAL HIGH (ref 2.5–4.6)

## 2018-09-29 SURGERY — TEMPORARY DIALYSIS CATHETER

## 2018-09-29 MED ORDER — SODIUM THIOSULFATE 25 % IV SOLN
25.0000 g | Freq: Once | INTRAVENOUS | Status: AC
  Administered 2018-09-29: 25 g via INTRAVENOUS
  Filled 2018-09-29: qty 100

## 2018-09-29 MED ORDER — VANCOMYCIN HCL IN DEXTROSE 1-5 GM/200ML-% IV SOLN
1000.0000 mg | Freq: Once | INTRAVENOUS | Status: AC
  Administered 2018-09-29: 1000 mg via INTRAVENOUS
  Filled 2018-09-29: qty 200

## 2018-09-29 MED ORDER — CEFAZOLIN SODIUM-DEXTROSE 1-4 GM/50ML-% IV SOLN
1.0000 g | INTRAVENOUS | Status: AC
  Administered 2018-09-30: 1 g via INTRAVENOUS
  Filled 2018-09-29: qty 50

## 2018-09-29 MED ORDER — INSULIN ASPART 100 UNIT/ML ~~LOC~~ SOLN
0.0000 [IU] | Freq: Three times a day (TID) | SUBCUTANEOUS | Status: DC
  Administered 2018-09-29 – 2018-10-03 (×4): 2 [IU] via SUBCUTANEOUS
  Administered 2018-10-03 – 2018-10-04 (×2): 1 [IU] via SUBCUTANEOUS
  Administered 2018-10-04: 2 [IU] via SUBCUTANEOUS
  Administered 2018-10-04: 3 [IU] via SUBCUTANEOUS
  Administered 2018-10-05: 1 [IU] via SUBCUTANEOUS
  Filled 2018-09-29 (×9): qty 1

## 2018-09-29 MED ORDER — PENTAFLUOROPROP-TETRAFLUOROETH EX AERO
1.0000 "application " | INHALATION_SPRAY | CUTANEOUS | Status: DC | PRN
  Filled 2018-09-29: qty 30

## 2018-09-29 MED ORDER — LIDOCAINE HCL (PF) 1 % IJ SOLN
5.0000 mL | INTRAMUSCULAR | Status: DC | PRN
  Filled 2018-09-29: qty 5

## 2018-09-29 MED ORDER — VANCOMYCIN HCL 1000 MG IV SOLR
1000.0000 mg | Freq: Once | INTRAVENOUS | Status: DC
  Filled 2018-09-29: qty 1000

## 2018-09-29 MED ORDER — ALTEPLASE 2 MG IJ SOLR: 2.0000 mg | Freq: Once | INTRAMUSCULAR | Status: DC | PRN

## 2018-09-29 MED ORDER — SODIUM CHLORIDE 0.9 % IV SOLN: 100.0000 mL | INTRAVENOUS | Status: DC | PRN

## 2018-09-29 MED ORDER — METRONIDAZOLE 500 MG PO TABS
500.0000 mg | ORAL_TABLET | Freq: Every morning | ORAL | Status: DC
  Administered 2018-09-29 – 2018-10-02 (×4): 500 mg via ORAL
  Filled 2018-09-29 (×4): qty 1

## 2018-09-29 MED ORDER — HEPARIN SODIUM (PORCINE) 1000 UNIT/ML DIALYSIS
1000.0000 [IU] | INTRAMUSCULAR | Status: DC | PRN
  Filled 2018-09-29: qty 1

## 2018-09-29 MED ORDER — LIDOCAINE-PRILOCAINE 2.5-2.5 % EX CREA
1.0000 "application " | TOPICAL_CREAM | CUTANEOUS | Status: DC | PRN
  Filled 2018-09-29: qty 5

## 2018-09-29 MED ORDER — EPOETIN ALFA 10000 UNIT/ML IJ SOLN
10000.0000 [IU] | INTRAMUSCULAR | Status: DC
  Administered 2018-10-02 – 2018-10-05 (×2): 10000 [IU] via INTRAVENOUS

## 2018-09-29 MED ORDER — NICOTINE 7 MG/24HR TD PT24
7.0000 mg | MEDICATED_PATCH | Freq: Every day | TRANSDERMAL | Status: DC
  Administered 2018-09-29 – 2018-10-05 (×7): 7 mg via TRANSDERMAL
  Filled 2018-09-29 (×8): qty 1

## 2018-09-29 SURGICAL SUPPLY — 7 items
DRAPE INCISE IOBAN 66X45 STRL (DRAPES) ×3 IMPLANT
KIT DIALYSIS CATH TRI 30X13 (CATHETERS) ×2 IMPLANT
PACK ANGIOGRAPHY (CUSTOM PROCEDURE TRAY) ×3 IMPLANT
SUT MNCRL AB 4-0 PS2 18 (SUTURE) ×3 IMPLANT
SUT PROLENE 0 CT 1 30 (SUTURE) ×3 IMPLANT
SUT VIC AB 3-0 SH 27 (SUTURE) ×3
SUT VIC AB 3-0 SH 27X BRD (SUTURE) IMPLANT

## 2018-09-29 NOTE — Consult Note (Signed)
WOC Nurse wound consult note Reason for Consult: calciphylaxis  Wound type: calciphylaxis  Pressure Injury POA: NA Measurement: did not measure, large necrotic wound on the right thigh and in the right groin. Right groin to be addressed by the vascular surgeon today Wound bed: 100% necrotic with two open areas noted that he would let me see that appear punched out and area draining yellow green drainage.  Drainage (amount, consistency, odor) yellow green  Periwound: intact, BKA on the same extremity Dressing procedure/placement/frequency: Add silver hydrofiber for exudate management.  Add crushed Flagyl for odor control, discussed with Linus Salmons PA with vascular service.  Explained use of both to the patient and the potential for purchase on [place] for silver dressing for exudate control.  I have also explained that this type of wound does not typically heal, the goal is to lessen the advancing necrotic tissue.   Discussed POC with patient and bedside nurse.  Re consult if needed, will not follow at this time. Thanks  Ilisa Hayworth [employer], RN,CWOCN, CNS, [place] [phone])

## 2018-09-29 NOTE — Progress Notes (Signed)
Hemodialysis- Initiated via new R Fem HD catheter without issue. Site is dry and dressing is intact. Patient currently without complaints. Lungs diminished bilaterally. HR regular. Mild generalized edema. Awaiting outpatient HD records. Hepatitis labs drawn. Patient stable for dialysis. Continue to monitor.

## 2018-09-29 NOTE — Progress Notes (Signed)
[facility] [place] NAME: [name]    MR#:  [id number]  DATE OF BIRTH:  December 17, 1965  SUBJECTIVE:  Patient here with graft infection  REVIEW OF SYSTEMS:    Review of Systems  Constitutional: Negative for fever, chills weight loss HENT: Negative for ear pain, nosebleeds, congestion, facial swelling, rhinorrhea, neck pain, neck stiffness and ear discharge.   Respiratory: Negative for cough, shortness of breath, wheezing  Cardiovascular: Negative for chest pain, palpitations and leg swelling.  Gastrointestinal: Negative for heartburn, abdominal pain, vomiting, diarrhea or consitpation Genitourinary: Negative for dysuria, urgency, frequency, hematuria Musculoskeletal: Negative for back pain or joint pain Neurological: Negative for dizziness, seizures, syncope, focal weakness,  numbness and headaches.  Hematological: Does not bruise/bleed easily.  Psychiatric/Behavioral: Negative for hallucinations, confusion, dysphoric mood SKIN graft infection/ulcer thigh   Tolerating Diet: npo      DRUG ALLERGIES:   Allergies  Allergen Reactions  . Hydrocodone Nausea And Vomiting    VITALS:  Blood pressure 132/77, pulse 92, temperature 98.1 F (36.7 C), temperature source Oral, resp. rate 16, height 6\' 1"  (1.854 m), weight 90.7 kg, SpO2 95 %.  PHYSICAL EXAMINATION:  Constitutional: Appears well-developed and well-nourished. No distress. HENT: Normocephalic. Marland Kitchen Oropharynx is clear and moist.  Eyes: Conjunctivae and EOM are normal. PERRLA, no scleral icterus.  Neck: Normal ROM. Neck supple. No JVD. No tracheal deviation. CVS: RRR, S1/S2 +, no murmurs, no gallops, no carotid bruit.  Pulmonary: Effort and breath sounds normal, no stridor, rhonchi, wheezes, rales.  Abdominal: Soft. BS +,  no distension, tenderness, rebound or guarding.  Musculoskeletal: Normal range of motion. Right BKA No edema and no tenderness.  Neuro: Alert. CN 2-12  grossly intact. No focal deficits. Skin: large necrotic wound on the right thigh and in the right groin. Calciphylaxis Stump clean and intact Psychiatric: Normal mood and affect.      LABORATORY PANEL:   CBC Recent Labs  Lab 09/29/18 0338  WBC 8.6  HGB 7.5*  HCT 24.7*  PLT 248   ------------------------------------------------------------------------------------------------------------------  Chemistries  Recent Labs  Lab 09/28/18 1329  09/29/18 0338  NA 138  --  138  K 4.2  --  4.4  CL 96*  --  99  CO2 24  --  22  GLUCOSE 163*  --  146*  BUN 86*  --  90*  CREATININE 12.75*   < > 13.81*  CALCIUM 7.0*  --  7.1*  AST 16  --   --   ALT 14  --   --   ALKPHOS 60  --   --   BILITOT 0.5  --   --    < > = values in this interval not displayed.   ------------------------------------------------------------------------------------------------------------------  Cardiac Enzymes No results for input(s): TROPONINI in the last 168 hours. ------------------------------------------------------------------------------------------------------------------  RADIOLOGY:  No results found.   ASSESSMENT AND PLAN:   53 year old male with end-stage renal disease on hemodialysis, tobacco dependence and obesity who presented for emergent room from dialysis due to fistula ulceration/infection  1.  Infected left groin AV fistula with ulceration and drainage: Plan for temporary catheter placement today and left thigh shuntogram tomorrow Continue Zosyn/Flagyl and vancomycin silver hydrofiber for exudate management.  crushed Flagyl for odor control,  2.  End-stage renal disease on hemodialysis: Patient will have temporary catheter placement today and dialysis today.   3.  Diabetes: Continue sliding scale Diabetes nurse consult requested  4. Tobacco dependence: Patient is encouraged to  quit smoking and willing to attempt to quit was assessed. Patient highly motivated.Counseling was  provided for 4 minutes. HE would like nicotoine patch  Management plans discussed with the patient and he is in agreement.  CODE STATUS: full  TOTAL TIME TAKING CARE OF THIS PATIENT: 30 minutes.     POSSIBLE D/C 2-4 days, DEPENDING ON CLINICAL CONDITION.   Bettey Costa M.D on 09/29/2018 at 12:17 PM  Between 7am to 6pm - Pager - [phone] After 6pm go to [email or web address] - password [email or web address] [facility]  Office  [phone]  CC: Primary care physician; Sandi Mariscal, MD  Note: This dictation was prepared with Dragon dictation along with smaller phrase technology. Any transcriptional errors that result from this process are unintentional.

## 2018-09-29 NOTE — Consult Note (Signed)
[facility] CONSULT NOTE    Date: 09/29/2018                  Patient Name:  [name]  MRN: [id number]  DOB: 1965/08/27  Age / Sex: 53 y.o., male         PCP: Sandi Mariscal, MD                 Service Requesting Consult: Hospitalist                 Reason for Consult: Management of end-stage renal disease             History of Present Illness: Patient is a 53 y.o. male with a PMHx of ESRD on HD MWF, anemia chronic kidney disease, secondary hyperparathyroidism, diabetes mellitus type 2, calciphylaxis, left BKA, left thigh graft, who was admitted to [facility] on 09/28/2018 for evaluation of drainage fromleft groin AV graft. He reports recently that he's had ulceration with drainage.  He was therefore admitted for antibiotic therapy as well as further vascular evaluation.  Temporary right femoral dialysis catheter has been placed and is ready for use.  The patient reports that he does have underlying calciphylaxis that has been treated with sodium bicarbonate sulfate as an outpatient. He also has secondary hyperparathyroidism treated with binder therapy.  Patient also has anemia chronic kidney disease with hemoglobin of 7.5.  Medications: Outpatient medications: Medications Prior to Admission  Medication Sig Dispense Refill Last Dose  . aspirin EC 81 MG EC tablet Take 1 tablet (81 mg total) by mouth daily. 30 tablet 11 09/27/2018 at 0800  . AURYXIA 1 GM 210 MG(Fe) tablet Take 1 tablet by mouth 3 (three) times daily with meals.   09/27/2018 at 2000  . b complex-vitamin c-folic acid (NEPHRO-VITE) 0.8 MG TABS tablet Take 1 tablet by mouth at bedtime. 30 tablet 0 09/27/2018 at 0800  . Dulaglutide (TRULICITY) 4.09 WJ/1.9JY SOPN Inject 0.75 mg into the skin every Friday.    Past Week at Unknown time  . insulin detemir (LEVEMIR) 100 UNIT/ML injection Inject 0.2 mLs (20 Units total) into the skin at bedtime. (Patient taking differently: Inject 4-20 Units into the skin at bedtime.  Per sliding scale) 10 mL 11 09/27/2018 at 2000  . pantoprazole (PROTONIX) 40 MG tablet Take 40 mg by mouth daily.   09/27/2018 at 2000  . acetaminophen (TYLENOL) 500 MG tablet Take 1,000 mg by mouth as needed for moderate pain (takes everytime with dialysis).   prn at prn  . cyclobenzaprine (FLEXERIL) 5 MG tablet Take 5 mg by mouth as needed for muscle spasms.   Taking  . ibuprofen (ADVIL,MOTRIN) 200 MG tablet Take 400 mg by mouth every 6 (six) hours as needed for moderate pain.   prn at prn  . NOVOLOG FLEXPEN 100 UNIT/ML FlexPen 2-3 Units as needed for high blood sugar. Per sliding scale   prn at prn  . oxyCODONE-acetaminophen (PERCOCET/ROXICET) 5-325 MG tablet Take 1 tablet by mouth every 6 (six) hours as needed for severe pain. (Patient taking differently: Take 2 tablets by mouth every 6 (six) hours as needed for severe pain (leg pain). ) 15 tablet 0 Taking  . VENTOLIN HFA 108 (90 Base) MCG/ACT inhaler Inhale 2 puffs into the lungs as needed for wheezing.    prn at prn    Current medications: Current Facility-Administered Medications  Medication Dose Route Frequency Provider Last Rate Last Dose  . 0.9 %  sodium chloride  infusion  250 mL Intravenous PRN Gorden Harms, MD   Stopped at 09/28/18 2333  . 0.9 %  sodium chloride infusion  100 mL Intravenous PRN Lukas Pelcher, MD      . 0.9 %  sodium chloride infusion  100 mL Intravenous PRN Ashantae Pangallo, MD      . acetaminophen (TYLENOL) tablet 1,000 mg  1,000 mg Oral Q6H PRN Salary, Montell D, MD      . albuterol (PROVENTIL) (2.5 MG/3ML) 0.083% nebulizer solution 2.5 mg  2.5 mg Nebulization Q2H PRN Salary, Montell D, MD      . alteplase (CATHFLO ACTIVASE) injection 2 mg  2 mg Intracatheter Once PRN Anthonette Legato, MD      . Derrill Memo ON 09/30/2018] ceFAZolin (ANCEF) IVPB 1 g/50 mL premix  1 g Intravenous On Call Stegmayer, Kimberly A, PA-C      . ceFEPIme (MAXIPIME) 2 g in sodium chloride 0.9 % 100 mL IVPB  2 g Intravenous Q M,W,F-HD Salary,  Avel Peace, MD   Stopped at 09/28/18 2325  . Chlorhexidine Gluconate Cloth 2 % PADS 6 each  6 each Topical Q0600 Anthonette Legato, MD   6 each at 09/29/18 0612  . cyclobenzaprine (FLEXERIL) tablet 5 mg  5 mg Oral TID PRN Salary, Montell D, MD      . ferric citrate (AURYXIA) tablet 210 mg  210 mg Oral TID WC Salary, Montell D, MD   210 mg at 09/28/18 1728  . heparin injection 1,000 Units  1,000 Units Dialysis PRN Christien Berthelot, MD      . heparin injection 5,000 Units  5,000 Units Subcutaneous Q8H Salary, Montell D, MD      . insulin aspart (novoLOG) injection 0-15 Units  0-15 Units Subcutaneous TID WC Salary, Montell D, MD      . insulin aspart (novoLOG) injection 0-5 Units  0-5 Units Subcutaneous QHS Salary, Montell D, MD      . lidocaine (PF) (XYLOCAINE) 1 % injection 5 mL  5 mL Intradermal PRN Eliceo Gladu, MD      . lidocaine-prilocaine (EMLA) cream 1 application  1 application Topical PRN Frederick Klinger, MD      . metroNIDAZOLE (FLAGYL) tablet 500 mg  500 mg Oral q morning - 10a Mody, Sital, MD      . multivitamin (RENA-VIT) tablet 1 tablet  1 tablet Oral QHS Salary, Montell D, MD      . nicotine (NICODERM CQ - dosed in mg/24 hr) patch 7 mg  7 mg Transdermal Daily Mody, Sital, MD      . ondansetron (ZOFRAN) tablet 4 mg  4 mg Oral Q6H PRN Salary, Montell D, MD       Or  . ondansetron (ZOFRAN) injection 4 mg  4 mg Intravenous Q6H PRN Salary, Montell D, MD      . oxyCODONE-acetaminophen (PERCOCET/ROXICET) 5-325 MG per tablet 2 tablet  2 tablet Oral Q6H PRN Salary, Avel Peace, MD   2 tablet at 09/28/18 1728  . pantoprazole (PROTONIX) EC tablet 40 mg  40 mg Oral Daily Salary, Montell D, MD   40 mg at 09/28/18 1728  . pentafluoroprop-tetrafluoroeth (GEBAUERS) aerosol 1 application  1 application Topical PRN Chanoch Mccleery, MD      . polyethylene glycol (MIRALAX / GLYCOLAX) packet 17 g  17 g Oral Daily PRN Salary, Montell D, MD      . sodium chloride flush (NS) 0.9 % injection 3 mL  3 mL  Intravenous Q12H Salary, Avel Peace, MD      .  sodium chloride flush (NS) 0.9 % injection 3 mL  3 mL Intravenous PRN Salary, Montell D, MD      . Derrill Memo ON 09/30/2018] vancomycin (VANCOCIN) IVPB 1000 mg/200 mL premix  1,000 mg Intravenous Q M,W,F-HD Dallie Piles, RPH          Allergies: Allergies  Allergen Reactions  . Hydrocodone Nausea And Vomiting      Past Medical History: Past Medical History:  Diagnosis Date  . Amputee, below knee, left ([place])   . Anemia   . Blood transfusion   . Bronchitis   . Chest pain   . CHF (congestive heart failure) ([place])   . Chronic kidney disease    dialysis, M-W-F, [facility]  . Diabetes mellitus    type 2  . Dysrhythmia    irregular heartbeat  . ESRD (end stage renal disease) on dialysis ([place])    first HD 06/19/04  . GERD (gastroesophageal reflux disease)   . Hyperparathyroidism ([place])   . Morbid obesity ([place])      Past Surgical History: Past Surgical History:  Procedure Laterality Date  . AMPUTATION Left 01/20/2015   Procedure: Left Great Toe Amputation;  Surgeon: Newt Minion, MD;  Location: [place];  Service: Orthopedics;  Laterality: Left;  . AMPUTATION Left 03/07/2015   Procedure: Left Foot 1st and 2nd Ray Amputation;  Surgeon: Newt Minion, MD;  Location: [place];  Service: Orthopedics;  Laterality: Left;  . AMPUTATION Left 04/14/2015   Procedure: LEFT BELOW THE KNEE AMPUTATION ;  Surgeon: Newt Minion, MD;  Location: [place];  Service: Orthopedics;  Laterality: Left;  . ANKLE FUSION     rt foot charcot joint 2008 with refusion  . AV FISTULA PLACEMENT  10/11/2010  . AV FISTULA PLACEMENT  07/02/2011   Procedure: INSERTION OF ARTERIOVENOUS (AV) GORE-TEX GRAFT ARM;  Surgeon: Elam Dutch, MD;  Location: [place];  Service: Vascular;  Laterality: Left;  . AV FISTULA PLACEMENT Right 11/06/2012   Procedure: INSERTION OF ARTERIOVENOUS (AV) GORE-TEX GRAFT ARM;  Surgeon: Mal Misty, MD;  Location: [place];  Service: Vascular;  Laterality: Right;   . AV FISTULA PLACEMENT Right 03/21/2016   Procedure: INSERTION OF ARTERIOVENOUS (AV) GORE-TEX GRAFT ARM ( LOOP FOREARM );  Surgeon: Algernon Huxley, MD;  Location: [facility];  Service: Vascular;  Laterality: Right;  . AV FISTULA PLACEMENT Left 11/07/2016   Procedure: INSERTION OF ARTERIOVENOUS (AV) Artegraft;  Surgeon: Algernon Huxley, MD;  Location: [facility];  Service: Vascular;  Laterality: Left;  . [place] REMOVAL Right 11/06/2012   Procedure: REMOVAL OF ARTERIOVENOUS GORETEX GRAFT ([place]);  Surgeon: Mal Misty, MD;  Location: [place];  Service: Vascular;  Laterality: Right;  . ENDARTERECTOMY FEMORAL  11/07/2016   Procedure: ENDARTERECTOMY FEMORAL;  Surgeon: Algernon Huxley, MD;  Location: [facility];  Service: Vascular;;  . HERNIA REPAIR     Umbilical Hernia  . INSERTION OF DIALYSIS CATHETER  01/20/2012   Procedure: INSERTION OF DIALYSIS CATHETER;  Surgeon: Serafina Mitchell, MD;  Location: [place];  Service: Vascular;  Laterality: N/A;  insertion left internal jugular dialysis catheter 23cm  . INSERTION OF DIALYSIS CATHETER Right 11/09/2012   Procedure: INSERTION OF DIALYSIS CATHETER;  Surgeon: Conrad Elgin, MD;  Location: [place];  Service: Vascular;  Laterality: Right;  Right  Femoral Vein  . INSERTION OF DIALYSIS CATHETER N/A 08/18/2015   Procedure: INSERTION OF DIALYSIS CATHETER RIGHT FEMORAL VEIN;  Surgeon: Mal Misty, MD;  [facility];  Service: Cardiovascular;  Laterality: N/A;  . PARATHYROIDECTOMY    . PERIPHERAL VASCULAR CATHETERIZATION Right 04/04/2016   Procedure: A/V Shuntogram/Fistulagram;  Surgeon: Algernon Huxley, MD;  Location: [place] [facility];  Service: Cardiovascular;  Laterality: Right;  . REMOVAL OF A HERO DEVICE Left 08/14/2015   Procedure: REMOVAL OF LEFT ARM HERO DEVICE;  Surgeon: Rosetta Posner, MD;  Location: [place];  Service: Vascular;  Laterality: Left;  . TEE WITHOUT CARDIOVERSION N/A 08/18/2015   Procedure: TRANSESOPHAGEAL ECHOCARDIOGRAM (TEE);  Surgeon: Mal Misty, MD;  Location: [place];  Service: Cardiovascular;  Laterality: N/A;  . TEE WITHOUT CARDIOVERSION N/A 12/21/2015   Procedure: TRANSESOPHAGEAL ECHOCARDIOGRAM (TEE);  Surgeon: Dixie Dials, MD;  Location: [facility];  Service: Cardiovascular;  Laterality: N/A;  . THROMBECTOMY    . Umbilcial Hernia    . UPPER EXTREMITY VENOGRAPHY Left 10/31/2016   Procedure: Upper Extremity Venography;  Surgeon: Algernon Huxley, MD;  Location: [place] [facility];  Service: Cardiovascular;  Laterality: Left;  Marland Kitchen VENOGRAM N/A 01/06/2014   Procedure: VENOGRAM;  Surgeon: Conrad Marion, MD;  Location: [facility];  Service: Cardiovascular;  Laterality: N/A;     Family History: Family History  Problem Relation Age of Onset  . Heart disease Mother   . Kidney disease Mother   . Hypertension Mother      Social History: Social History   Socioeconomic History  . Marital status: Married    Spouse name: Not on file  . Number of children: Not on file  . Years of education: Not on file  . Highest education level: Not on file  Occupational History  . Not on file  Social Needs  . Financial resource strain: Not on file  . Food insecurity:    Worry: Not on file    Inability: Not on file  . Transportation needs:    Medical: Not on file    Non-medical: Not on file  Tobacco Use  . Smoking status: Current Every Day Smoker    Years: 20.00    Types: Cigarettes  . Smokeless tobacco: Never Used  Substance and Sexual Activity  . Alcohol use: No    Alcohol/week: 0.0 standard drinks  . Drug use: Yes    Comment: oxycodone prescribed  . Sexual activity: Not Currently  Lifestyle  . Physical activity:    Days per week: Not on file    Minutes per session: Not on file  . Stress: Not on file  Relationships  . Social connections:    Talks on phone: Not on file    Gets together: Not on file    Attends religious service: Not on file    Active member of club or organization: Not on file    Attends meetings of clubs or organizations: Not on  file    Relationship status: Not on file  . Intimate partner violence:    Fear of current or ex partner: Not on file    Emotionally abused: Not on file    Physically abused: Not on file    Forced sexual activity: Not on file  Other Topics Concern  . Not on file  Social History Narrative  . Not on file     Review of Systems: Review of Systems  Constitutional: Positive for malaise/fatigue. Negative for chills and fever.  HENT: Negative for hearing loss, nosebleeds and tinnitus.   Eyes: Negative for blurred vision and double vision.  Respiratory: Negative for cough and  hemoptysis.   Cardiovascular: Negative for chest pain and palpitations.  Gastrointestinal: Negative for heartburn and nausea.  Genitourinary: Negative for dysuria, frequency and urgency.  Musculoskeletal: Positive for joint pain.  Skin: Negative for itching and rash.  Neurological: Negative for dizziness and focal weakness.  Endo/Heme/Allergies: Negative for polydipsia. Does not bruise/bleed easily.  Psychiatric/Behavioral: Negative for depression. The patient is not nervous/anxious.      Vital Signs: Blood pressure 124/63, pulse 94, temperature 98 F (36.7 C), temperature source Oral, resp. rate 18, height 6\' 1"  (1.854 m), weight 90.7 kg, SpO2 96 %.  Weight trends: Filed Weights   09/28/18 1316 09/29/18 1041 09/29/18 1209  Weight: 90.7 kg 90.7 kg 90.7 kg    Physical Exam: General: NAD, sitting up in bed  Head: Normocephalic, atraumatic.  Eyes: Anicteric, EOMI  Nose: Mucous membranes moist, not inflammed, nonerythematous.  Throat: Oropharynx nonerythematous, no exudate appreciated.   Neck: Supple, trachea midline.  Lungs:  Normal respiratory effort. Clear to auscultation BL without crackles or wheezes.  Heart: S1S2 no obvious rub  Abdomen:  BS normoactive. Soft, Nondistended, non-tender.  No masses or organomegaly.  Extremities: L BKA  Neurologic: A&O X3, Motor strength is 5/5 in the all 4 extremities   Skin: No visible rashes, scars.    Lab results: Basic Metabolic Panel: Recent Labs  Lab 09/28/18 1329 09/28/18 1625 09/29/18 0338 09/29/18 0910  NA 138  --  138  --   K 4.2  --  4.4  --   CL 96*  --  99  --   CO2 24  --  22  --   GLUCOSE 163*  --  146*  --   BUN 86*  --  90*  --   CREATININE 12.75* 12.60* 13.81*  --   CALCIUM 7.0*  --  7.1*  --   PHOS  --   --   --  7.9*    Liver Function Tests: Recent Labs  Lab 09/28/18 1329  AST 16  ALT 14  ALKPHOS 60  BILITOT 0.5  PROT 7.4  ALBUMIN 3.3*   No results for input(s): LIPASE, AMYLASE in the last 168 hours. No results for input(s): AMMONIA in the last 168 hours.  CBC: Recent Labs  Lab 09/28/18 1329 09/28/18 1625 09/29/18 0338  WBC 10.9* 10.4 8.6  NEUTROABS 8.6*  --   --   HGB 8.4* 8.6* 7.5*  HCT 27.4* 28.0* 24.7*  MCV 93.5 93.0 93.9  PLT 269 269 248    Cardiac Enzymes: No results for input(s): CKTOTAL, CKMB, CKMBINDEX, TROPONINI in the last 168 hours.  BNP: Invalid input(s): POCBNP  CBG: Recent Labs  Lab 09/28/18 1701 09/28/18 2125 09/29/18 0730  GLUCAP 65* 148* 137*    Microbiology: Results for orders placed or performed during the hospital encounter of 09/28/18  Blood culture (routine x 2)     Status: None (Preliminary result)   Collection Time: 09/28/18  2:41 PM  Result Value Ref Range Status   Specimen Description BLOOD RIGHT ANTECUBITAL  Final   Special Requests   Final    BOTTLES DRAWN AEROBIC AND ANAEROBIC Blood Culture results may not be optimal due to an excessive volume of blood received in culture bottles   Culture   Final    NO GROWTH < 24 HOURS Performed at [facility], [address] [place], [place] [address]    Report Status PENDING  Incomplete  Blood culture (routine x 2)     Status: None (Preliminary result)  Collection Time: 09/28/18  4:25 PM  Result Value Ref Range Status   Specimen Description BLOOD RIGHT ANTECUBITAL  Final   Special Requests   Final     BOTTLES DRAWN AEROBIC AND ANAEROBIC Blood Culture results may not be optimal due to an inadequate volume of blood received in culture bottles   Culture   Final    NO GROWTH < 24 HOURS Performed at [facility], [address] [place], [place] [address]    Report Status PENDING  Incomplete    Coagulation Studies: No results for input(s): LABPROT, INR in the last 72 hours.  Urinalysis: No results for input(s): COLORURINE, LABSPEC, PHURINE, GLUCOSEU, HGBUR, BILIRUBINUR, KETONESUR, PROTEINUR, UROBILINOGEN, NITRITE, LEUKOCYTESUR in the last 72 hours.  Invalid input(s): APPERANCEUR    Imaging:  No results found.   Assessment & Plan: Pt is a 53 y.o. male with a PMHx of ESRD on HD MWF, anemia chronic kidney disease, secondary hyperparathyroidism, diabetes mellitus type 2, calciphylaxis, left BKA, left thigh graft, who was admitted to [facility] on 09/28/2018 for evaluation of drainage from left groin AV graft.  [place] [facility]/MWF  1.  ESRD on HD.  Temporary dialysis catheter was placed earlier today.  Therefore we will proceed with hemodialysis treatment today.  Ultrafiltration target increased to 3-3.5 kg.  2.  Calciphylaxis.  We will restart the patient on sodium thiosulfate with dialysis treatments.  3.  Anemia chronic kidney disease.  Hemoglobin currently 7.5.  We will start the patient on Epogen 10,000 units IV with dialysis.  4.  Secondary hyperparathyroidism.  We will maintain the patient on auriyxa and followup serum phosphorus over the course of hospitalization.  5.  complication dialysis device.  Patient has drainage from his left AV graft.  Vascular surgery has been consulted and they're planning for shuntogram tomorrow.

## 2018-09-29 NOTE — Progress Notes (Signed)
Inpatient Diabetes Program Recommendations  AACE/ADA: New Consensus Statement on Inpatient Glycemic Control (2015)  Target Ranges:  Prepandial:   less than 140 mg/dL      Peak postprandial:   less than 180 mg/dL (1-2 hours)      Critically ill patients:  140 - 180 mg/dL   Lab Results  Component Value Date   GLUCAP 137 (H) 09/29/2018   HGBA1C (H) 07/30/2007    7.1 (NOTE)   The ADA recommends the following therapeutic goals for glycemic   control related to Hgb A1C measurement:   Goal of Therapy:   < 7.0% Hgb A1C   Action Suggested:  > 8.0% Hgb A1C   Ref:  Diabetes Care, 22, Suppl. 1, 1999    Review of Glycemic Control Results for [name], [name] (MRN [id number]) as of 09/29/2018 13:18  Ref. Range 09/28/2018 17:01 09/28/2018 21:25 09/29/2018 07:30  Glucose-Capillary Latest Ref Range: 70 - 99 mg/dL 65 (L) 148 (H) 137 (H)   Diabetes history:  Outpatient Diabetes medications:Trulicity 1.10 mg q Friday, Levemir 20 units q HS (4-20 units per SSI), Novolog 2-3 units tid with meals  Current orders for Inpatient glycemic control:  Novolog moderate tid with meals and HS Inpatient Diabetes Program Recommendations:    Referral received and chart reviewed. May consider reducing Novolog correction to sensitive tid with meals.   Thanks, Adah Perl, RN, BC-ADM Inpatient Diabetes Coordinator Pager [phone] (8a-5p)

## 2018-09-29 NOTE — Progress Notes (Signed)
Hemodialysis completed without issue. UF 3.3L. Patient tolerated well. R Fem cath site with bloody saturated and leaking dressing. Reinforced. Discussed new cath care with patient. Report given to primary RN. All vitals stable post treatment. Patient left unit in stable condition.

## 2018-09-29 NOTE — Consult Note (Signed)
Pharmacy Antibiotic Note  [name] is a 53 y.o. male admitted on 09/28/2018 with and infected AV fistula. Pharmacy has been consulted for vancomycin dosing. He is also prescribed cefepime. He presents for evaluation of dialysis graft ulcer and infection.  Patient is s/p Left superficial femoral artery to femoral veinarteriovenous graft femoral artery endarterectomy. Dr. Lucky Cowboy is planning for a temporary hemodialysis catheter placement. Patient received a 1750 mg loading dose on 3/16  Plan: Vancomycin 1000 mg IV x 1 to be given today after HD due to patient not receiving HD on Monday. Called dialysis, patient had only 6 minutes left of full HD session so will give medication once patient is back on the floor.   Vancomycin 1000 mg IV every MWF HD session.  Goal pre-HD vancomycin level: 15-25 mcg/mL with level to be drawn prior to 3rd HD session  Height: 6\' 1"  (185.4 cm) Weight: 199 lb 15.3 oz (90.7 kg) IBW/kg (Calculated) : 79.9  Temp (24hrs), Avg:97.8 F (36.6 C), Min:97.5 F (36.4 C), Max:98.1 F (36.7 C)  Recent Labs  Lab 09/28/18 1329 09/28/18 1440 09/28/18 1625 09/29/18 0338  WBC 10.9*  --  10.4 8.6  CREATININE 12.75*  --  12.60* 13.81*  LATICACIDVEN  --  0.8  --   --     Estimated Creatinine Clearance: 6.8 mL/min (A) (by C-G formula based on SCr of 13.81 mg/dL (H)).    Antimicrobials this admission: Vancomycin 3/16 >>  cefepime 3/16 >>  Zosyn 3/16 x 1  Microbiology results: 3/16 BCx: pending 3/16 UCx: pending   Thank you for allowing pharmacy to be a part of this patient's care.  Pearla Dubonnet, PharmD 09/29/2018 3:46 PM

## 2018-09-29 NOTE — Progress Notes (Signed)
[facility]    Communication Note Will plan on left thigh shuntogram with Dr. Lucky Cowboy tomorrow. Will pre-op.  Discussed with Dr. Ellis Parents Ricky Caratachea PA-C 09/29/2018 10:42 AM

## 2018-09-29 NOTE — Op Note (Signed)
  OPERATIVE NOTE   PROCEDURE: 1. Insertion of temporary dialysis catheter catheter right femoral approach.  PRE-OPERATIVE DIAGNOSIS: Complication dialysis access with erosion and possible graft infection; end-stage renal disease requiring hemodialysis  POST-OPERATIVE DIAGNOSIS: Same  SURGEON: Katha Cabal M.D.  ANESTHESIA: 1% lidocaine local infiltration  ESTIMATED BLOOD LOSS: Minimal cc  INDICATIONS:   [name] is a 53 y.o. male who presents with skin changes overlying his left thigh AV access.  He now requires hemodialysis and then evaluation of his left thigh access.  Given that his current graft is not usable temporary catheter is being inserted.  Risks and benefits were reviewed all questions were answered patient has agreed to proceed.  DESCRIPTION: After obtaining full informed written consent, the patient was positioned supine. The right groin was prepped and draped in a sterile fashion. Ultrasound was placed in a sterile sleeve. Ultrasound was utilized to identify the right common femoral vein which is noted to be echolucent and compressible indicating patency. Images recorded for the permanent record. Under real-time visualization a Seldinger needle is inserted into the vein and the guidewires advanced without difficulty. Small counterincision was made at the wire insertion site. Dilator is passed over the wire and the temporary dialysis catheter catheter is fed over the wire without difficulty.  All lumens aspirate and flush easily and are packed with heparin saline. Catheter secured to the skin of the right thigh with 2-0 silk. A sterile dressing is applied with Biopatch.  COMPLICATIONS: None  CONDITION: Unchanged  Hortencia Pilar Office:  [phone] 09/29/2018, 2:05 PM

## 2018-09-30 ENCOUNTER — Other Ambulatory Visit (INDEPENDENT_AMBULATORY_CARE_PROVIDER_SITE_OTHER): Payer: Self-pay | Admitting: Nurse Practitioner

## 2018-09-30 ENCOUNTER — Encounter
Admission: EM | Disposition: A | Discharge status: Home Health | Payer: Self-pay | Source: Home / Self Care | Attending: Internal Medicine

## 2018-09-30 ENCOUNTER — Encounter: Payer: Self-pay | Admitting: Vascular Surgery

## 2018-09-30 ENCOUNTER — Ambulatory Visit: Admission: RE | Admit: 2018-09-30 | Payer: Medicare Other | Source: Home / Self Care | Admitting: Vascular Surgery

## 2018-09-30 DIAGNOSIS — T82848A Pain from vascular prosthetic devices, implants and grafts, initial encounter: Secondary | ICD-10-CM

## 2018-09-30 DIAGNOSIS — S81802A Unspecified open wound, left lower leg, initial encounter: Secondary | ICD-10-CM

## 2018-09-30 DIAGNOSIS — N186 End stage renal disease: Secondary | ICD-10-CM

## 2018-09-30 DIAGNOSIS — T82868A Thrombosis of vascular prosthetic devices, implants and grafts, initial encounter: Secondary | ICD-10-CM

## 2018-09-30 DIAGNOSIS — Z992 Dependence on renal dialysis: Secondary | ICD-10-CM

## 2018-09-30 HISTORY — PX: A/V SHUNTOGRAM: CATH118297

## 2018-09-30 LAB — CBC
HCT: 26.2 % — ABNORMAL LOW (ref 39.0–52.0)
Hemoglobin: 8 g/dL — ABNORMAL LOW (ref 13.0–17.0)
MCH: 28.8 pg (ref 26.0–34.0)
MCHC: 30.5 g/dL (ref 30.0–36.0)
MCV: 94.2 fL (ref 80.0–100.0)
Platelets: 244 10*3/uL (ref 150–400)
RBC: 2.78 MIL/uL — AB (ref 4.22–5.81)
RDW: 16.5 % — ABNORMAL HIGH (ref 11.5–15.5)
WBC: 7.6 10*3/uL (ref 4.0–10.5)
nRBC: 0 % (ref 0.0–0.2)

## 2018-09-30 LAB — BASIC METABOLIC PANEL
Anion gap: 13 (ref 5–15)
BUN: 48 mg/dL — ABNORMAL HIGH (ref 6–20)
CO2: 28 mmol/L (ref 22–32)
Calcium: 8 mg/dL — ABNORMAL LOW (ref 8.9–10.3)
Chloride: 98 mmol/L (ref 98–111)
Creatinine, Ser: 8.75 mg/dL — ABNORMAL HIGH (ref 0.61–1.24)
GFR calc Af Amer: 7 mL/min — ABNORMAL LOW (ref >60)
GFR calc non Af Amer: 6 mL/min — ABNORMAL LOW (ref >60)
GLUCOSE: 136 mg/dL — AB (ref 70–99)
Potassium: 4.1 mmol/L (ref 3.5–5.1)
Sodium: 139 mmol/L (ref 135–145)

## 2018-09-30 LAB — GLUCOSE, CAPILLARY
GLUCOSE-CAPILLARY: 70 mg/dL (ref 70–99)
GLUCOSE-CAPILLARY: 211 mg/dL — AB (ref 70–99)
Glucose-Capillary: 102 mg/dL — ABNORMAL HIGH (ref 70–99)
Glucose-Capillary: 86 mg/dL (ref 70–99)

## 2018-09-30 LAB — HEPATITIS B SURFACE ANTIBODY, QUANTITATIVE: Hep B S AB Quant (Post): 23.3 m[IU]/mL

## 2018-09-30 LAB — HEPATITIS B SURFACE ANTIGEN: Hepatitis B Surface Ag: NEGATIVE

## 2018-09-30 LAB — PHOSPHORUS: Phosphorus: 6.1 mg/dL — ABNORMAL HIGH (ref 2.5–4.6)

## 2018-09-30 LAB — HIV ANTIBODY (ROUTINE TESTING W REFLEX): HIV Screen 4th Generation wRfx: NONREACTIVE

## 2018-09-30 LAB — PARATHYROID HORMONE, INTACT (NO CA): PTH: 173 pg/mL — ABNORMAL HIGH (ref 15–65)

## 2018-09-30 SURGERY — A/V SHUNTOGRAM
Anesthesia: Moderate Sedation | Laterality: Left

## 2018-09-30 MED ORDER — IOHEXOL 300 MG/ML  SOLN
INTRAMUSCULAR | Status: DC | PRN
  Administered 2018-09-30: 10 mL via INTRAVENOUS

## 2018-09-30 MED ORDER — HEPARIN SODIUM (PORCINE) 1000 UNIT/ML IJ SOLN
INTRAMUSCULAR | Status: AC
  Filled 2018-09-30: qty 1

## 2018-09-30 MED ORDER — POLYETHYLENE GLYCOL 3350 17 G PO PACK: 17.0000 g | PACK | Freq: Every day | ORAL | Status: DC

## 2018-09-30 MED ORDER — CEFAZOLIN SODIUM-DEXTROSE 2-4 GM/100ML-% IV SOLN: 2.0000 g | INTRAVENOUS | Status: DC

## 2018-09-30 MED ORDER — MIDAZOLAM HCL 2 MG/ML PO SYRP: 8.0000 mg | ORAL_SOLUTION | Freq: Once | ORAL | Status: DC | PRN

## 2018-09-30 MED ORDER — HEPARIN (PORCINE) IN NACL 1000-0.9 UT/500ML-% IV SOLN
INTRAVENOUS | Status: AC
  Filled 2018-09-30: qty 500

## 2018-09-30 MED ORDER — FERRIC CITRATE 1 GM 210 MG(FE) PO TABS
630.0000 mg | ORAL_TABLET | Freq: Three times a day (TID) | ORAL | Status: DC
  Administered 2018-10-01 – 2018-10-04 (×9): 630 mg via ORAL
  Filled 2018-09-30 (×17): qty 3

## 2018-09-30 MED ORDER — FENTANYL CITRATE (PF) 100 MCG/2ML IJ SOLN
INTRAMUSCULAR | Status: DC | PRN
  Administered 2018-09-30 (×2): 50 ug via INTRAVENOUS

## 2018-09-30 MED ORDER — EPOETIN ALFA 10000 UNIT/ML IJ SOLN
10000.0000 [IU] | INTRAMUSCULAR | Status: DC
  Administered 2018-09-30: 10000 [IU] via INTRAVENOUS

## 2018-09-30 MED ORDER — SODIUM THIOSULFATE 25 % IV SOLN
25.0000 g | INTRAVENOUS | Status: DC
  Administered 2018-09-30 – 2018-10-05 (×3): 25 g via INTRAVENOUS
  Filled 2018-09-30 (×4): qty 100

## 2018-09-30 MED ORDER — LIDOCAINE-EPINEPHRINE (PF) 1 %-1:200000 IJ SOLN
INTRAMUSCULAR | Status: AC
  Filled 2018-09-30: qty 30

## 2018-09-30 MED ORDER — FENTANYL CITRATE (PF) 100 MCG/2ML IJ SOLN
INTRAMUSCULAR | Status: AC
  Administered 2018-10-01: 50 ug via INTRAVENOUS
  Filled 2018-09-30: qty 2

## 2018-09-30 MED ORDER — ALTEPLASE 2 MG IJ SOLR
INTRAMUSCULAR | Status: DC | PRN
  Administered 2018-09-30: 14 mg

## 2018-09-30 MED ORDER — ONDANSETRON HCL 4 MG/2ML IJ SOLN: 4.0000 mg | Freq: Four times a day (QID) | INTRAMUSCULAR | Status: DC | PRN

## 2018-09-30 MED ORDER — SODIUM CHLORIDE 0.9 % IV SOLN: INTRAVENOUS | Status: DC

## 2018-09-30 MED ORDER — FAMOTIDINE 20 MG PO TABS: 40.0000 mg | ORAL_TABLET | Freq: Once | ORAL | Status: DC | PRN

## 2018-09-30 MED ORDER — MIDAZOLAM HCL 5 MG/5ML IJ SOLN
INTRAMUSCULAR | Status: AC
  Filled 2018-09-30: qty 5

## 2018-09-30 MED ORDER — DIPHENHYDRAMINE HCL 50 MG/ML IJ SOLN: 50.0000 mg | Freq: Once | INTRAMUSCULAR | Status: DC | PRN

## 2018-09-30 MED ORDER — MIDAZOLAM HCL 2 MG/2ML IJ SOLN
INTRAMUSCULAR | Status: DC | PRN
  Administered 2018-09-30: 1 mg via INTRAVENOUS
  Administered 2018-09-30: 2 mg via INTRAVENOUS

## 2018-09-30 MED ORDER — METHYLPREDNISOLONE SODIUM SUCC 125 MG IJ SOLR: 125.0000 mg | Freq: Once | INTRAMUSCULAR | Status: DC | PRN

## 2018-09-30 MED ORDER — CEFAZOLIN SODIUM-DEXTROSE 1-4 GM/50ML-% IV SOLN
INTRAVENOUS | Status: AC
  Filled 2018-09-30: qty 50

## 2018-09-30 MED ORDER — HYDROMORPHONE HCL 1 MG/ML IJ SOLN
1.0000 mg | Freq: Once | INTRAMUSCULAR | Status: AC | PRN
  Administered 2018-10-01: 1 mg via INTRAVENOUS
  Filled 2018-09-30: qty 1

## 2018-09-30 MED ORDER — SODIUM CHLORIDE 0.9 % IV SOLN
INTRAVENOUS | Status: DC
  Administered 2018-09-30: 15:00:00 via INTRAVENOUS

## 2018-09-30 SURGICAL SUPPLY — 6 items
CANNULA 5F STIFF (CANNULA) ×2 IMPLANT
DRAPE BRACHIAL (DRAPES) ×2 IMPLANT
PACK ANGIOGRAPHY (CUSTOM PROCEDURE TRAY) ×3 IMPLANT
SHEATH BRITE TIP 5FRX11 (SHEATH) ×2 IMPLANT
SHEATH BRITE TIP 6FRX5.5 (SHEATH) ×2 IMPLANT
SUT MNCRL AB 4-0 PS2 18 (SUTURE) ×2 IMPLANT

## 2018-09-30 NOTE — Progress Notes (Signed)
HD Tx Start   09/30/18 0955  Vital Signs  Pulse Rate 88  Resp 16  BP 110/72  Oxygen Therapy  SpO2 100 %  O2 Device Room Air  During Hemodialysis Assessment  Blood Flow Rate (mL/min) 400 mL/min  Arterial Pressure (mmHg) -180 mmHg  Venous Pressure (mmHg) 160 mmHg  Transmembrane Pressure (mmHg) 60 mmHg  Ultrafiltration Rate (mL/min) 1090 mL/min (109mL per HOUR removed)  Dialysate Flow Rate (mL/min) 800 ml/min  Conductivity: Machine  14  HD Safety Checks Performed Yes  Dialysis Fluid Bolus Normal Saline  Bolus Amount (mL) 250 mL  Intra-Hemodialysis Comments Tx initiated

## 2018-09-30 NOTE — Progress Notes (Signed)
Pre HD Assessment   09/30/18 0945  Neurological  Level of Consciousness Alert  Orientation Level Oriented X4  Respiratory  Respiratory Pattern Regular;Unlabored  Chest Assessment Chest expansion symmetrical  Bilateral Breath Sounds Clear;Diminished  Cough None  Cardiac  Pulse Regular  ECG Monitor Yes  Cardiac Rhythm NSR  Vascular  R Radial Pulse +2  L Radial Pulse +2  Integumentary  Integumentary (WDL) X  Skin Color Appropriate for ethnicity  Skin Condition Dry  Additional Integumentary Comments  (HD Pt - area of calphlxis on left leg)  Musculoskeletal  Musculoskeletal (WDL) X  Generalized Weakness Yes  Gastrointestinal  Bowel Sounds Assessment Active  GU Assessment  Genitourinary (WDL) X  Genitourinary Symptoms Anuria (HD pt of 14 years)  Psychosocial  Psychosocial (WDL) WDL  Wound / Incision (Open or Dehisced) 09/28/18 Other (Comment)  Date First Assessed/Time First Assessed: 09/28/18 1730   Wound Type: (c) Other (Comment)  Dressing Type Other (Comment) (Changed@HD  tx.Vaseline gauze, 4x4 gauze+kerlix)  Dressing Change Frequency Daily  Drainage Amount None

## 2018-09-30 NOTE — Progress Notes (Signed)
[facility] [place] NAME: [name]    MR#:  [id number]  DATE OF BIRTH:  [birth year]  SUBJECTIVE:   Patient seen during dialysis.  He has temporary catheter placed.  Plan is for shuntogram this afternoon.  REVIEW OF SYSTEMS:    Review of Systems  Constitutional: Negative for fever, chills weight loss HENT: Negative for ear pain, nosebleeds, congestion, facial swelling, rhinorrhea, neck pain, neck stiffness and ear discharge.   Respiratory: Negative for cough, shortness of breath, wheezing  Cardiovascular: Negative for chest pain, palpitations and leg swelling.  Gastrointestinal: Negative for heartburn, abdominal pain, vomiting, diarrhea or consitpation Genitourinary: Negative for dysuria, urgency, frequency, hematuria Musculoskeletal: Negative for back pain or joint pain Neurological: Negative for dizziness, seizures, syncope, focal weakness,  numbness and headaches.  Hematological: Does not bruise/bleed easily.  Psychiatric/Behavioral: Negative for hallucinations, confusion, dysphoric mood SKIN graft infection/ulcer thigh Tolerating Diet: yes     DRUG ALLERGIES:   Allergies  Allergen Reactions  . Hydrocodone Nausea And Vomiting    VITALS:  Blood pressure 120/69, pulse 83, temperature 98.8 F (37.1 C), temperature source Oral, resp. rate 14, height 6\' 1"  (1.854 m), weight 101.3 kg, SpO2 95 %.  PHYSICAL EXAMINATION:  Constitutional: Appears well-developed and well-nourished. No distress. HENT: Normocephalic. Marland Kitchen Oropharynx is clear and moist.  Eyes: Conjunctivae and EOM are normal. PERRLA, no scleral icterus.  Neck: Normal ROM. Neck supple. No JVD. No tracheal deviation. CVS: RRR, S1/S2 +, no murmurs, no gallops, no carotid bruit.  Pulmonary: Effort and breath sounds normal, no stridor, rhonchi, wheezes, rales.  Abdominal: Soft. BS +,  no distension, tenderness, rebound or guarding.  Musculoskeletal: Normal range of motion.  Right BKA No edema and no tenderness.  Neuro: Alert. CN 2-12 grossly intact. No focal deficits. Skin: large necrotic wound on the right thigh and in the right groin. Calciphylaxis Stump clean and intact Psychiatric: Normal mood and affect.  Right femoral catheter    LABORATORY PANEL:   CBC Recent Labs  Lab 09/30/18 0500  WBC 7.6  HGB 8.0*  HCT 26.2*  PLT 244   ------------------------------------------------------------------------------------------------------------------  Chemistries  Recent Labs  Lab 09/28/18 1329  09/30/18 0500  NA 138   < > 139  K 4.2   < > 4.1  CL 96*   < > 98  CO2 24   < > 28  GLUCOSE 163*   < > 136*  BUN 86*   < > 48*  CREATININE 12.75*   < > 8.75*  CALCIUM 7.0*   < > 8.0*  AST 16  --   --   ALT 14  --   --   ALKPHOS 60  --   --   BILITOT 0.5  --   --    < > = values in this interval not displayed.   ------------------------------------------------------------------------------------------------------------------  Cardiac Enzymes No results for input(s): TROPONINI in the last 168 hours. ------------------------------------------------------------------------------------------------------------------  RADIOLOGY:  No results found.   ASSESSMENT AND PLAN:   53 year old male with end-stage renal disease on hemodialysis, tobacco dependence and obesity who presented for emergent room from dialysis due to fistula ulceration/infection  1.  Infected left groin AV fistula with ulceration and drainage: Plan for shuntogram today by vascular surgery.   If cultures are positive graft will have to come out surgically if cultures are negative, may consider surgical revision as per vascular surgery.    Continue cefepime/Flagyl and vancomycin  As per wound care consult:  Silver hydrofiber for exudate management.  crushed Flagyl for odor control,  2.  End-stage renal disease on hemodialysis: Continue hemodialysis via temporary  catheter Schedule as outpatient.  3.  Diabetes: Continue sliding scale   4. Tobacco dependence: Patient is encouraged to quit smoking and willing to attempt to quit was assessed. Patient highly motivated.Counseling was provided for 4 minutes. Continue nicotoine patch  5.  Anemia of chronic disease: Continue Epogen  6.  Calciphylaxis: Continue sodium p.o. sulfate  Management plans discussed with the patient and he is in agreement.  CODE STATUS: full  TOTAL TIME TAKING CARE OF THIS PATIENT: 28  minutes.   D/w vascular  POSSIBLE D/C 2-4 days, DEPENDING ON CLINICAL CONDITION.   Bettey Costa M.D on 09/30/2018 at 12:22 PM  Between 7am to 6pm - Pager - [phone] After 6pm go to [email or web address] - password [email or web address] [facility]  Office  [phone]  CC: Primary care physician; Sandi Mariscal, MD  Note: This dictation was prepared with Dragon dictation along with smaller phrase technology. Any transcriptional errors that result from this process are unintentional.

## 2018-09-30 NOTE — Progress Notes (Signed)
Pre HD Tx   09/30/18 0930  Hand-Off documentation  Report given to (Full Name) Trellis Paganini RN  Report received from (Full Name) Crystal RN 2C  Vital Signs  Temp 98.8 F (37.1 C)  Temp Source Oral  Pulse Rate 88  Pulse Rate Source Monitor  Resp 14  BP 116/75  BP Location Left Arm  BP Method Automatic  Patient Position (if appropriate) Lying  Oxygen Therapy  SpO2 100 %  O2 Device Room Air  Pain Assessment  Pain Scale 0-10  Pain Score 0  Dialysis Weight  Weight 101.3 kg  Type of Weight Pre-Dialysis  Time-Out for Hemodialysis  What Procedure? Hemodialysis  Pt Identifiers(min of two) First/Last Name;MRN/Account#  Correct Site? Yes  Correct Side? Yes  Correct Procedure? Yes  Consents Verified? Yes  Rad Studies Available? N/A  Safety Precautions Reviewed? Yes  Engineer, civil (consulting) Number 4  Station Number 4  UF/Alarm Test Passed  Conductivity: Meter 14  Conductivity: Machine  14  pH 7.2  Reverse Osmosis main  Normal Saline Lot Number [id number]  Dialyzer Lot Number [id number]  Disposable Set Lot Number [id number]  Machine Temperature 98.6 F (37 C)  Musician and Audible Yes  Blood Lines Intact and Secured Yes  Pre Treatment Patient Checks  Vascular access used during treatment Catheter  Hepatitis B Surface Antigen Results Negative  Date Hepatitis B Surface Antigen Drawn 09/29/18  Hepatitis B Surface Antibody  (>10)  Date Hepatitis B Surface Antibody Drawn 09/29/18  Hemodialysis Consent Verified Yes  Hemodialysis Standing Orders Initiated Yes  ECG (Telemetry) Monitor On Yes  Prime Ordered Normal Saline  Length of  DialysisTreatment -hour(s) 3.5 Hour(s)  Dialysis Treatment Comments Na 140  Dialyzer Elisio 17H NR  Dialysate 2K, 2.5 Ca  Dialysate Flow Ordered 800  Blood Flow Rate Ordered 400 mL/min  Ultrafiltration Goal 2.5 Liters  Pre Treatment Labs Phosphorus  Dialysis Blood Pressure Support Ordered Normal Saline  Education / Care Plan  Dialysis  Education Provided Yes  Documented Education in Care Plan Yes  Fistula / Graft Right Femoral vein Hemodialysis catheter  Placement Date/Time: 11/09/12 1136   Placed prior to admission: No  Orientation: Right  Access Location: Femoral vein  Access Type: Hemodialysis catheter  Removal Reason: Occluded  Expiration Date: 08/16/15  Site Condition Painful  Drainage Description  (UTA - area covered)

## 2018-09-30 NOTE — Progress Notes (Signed)
HD Tx End   09/30/18 1345  Hand-Off documentation  Report given to (Full Name) Crystal RN   Report received from (Full Name) Trellis Paganini  Vital Signs  Temp 98.6 F (37 C)  Temp Source Oral  Pulse Rate 85  Pulse Rate Source Monitor  Resp 13  BP 117/62  BP Location Left Arm  BP Method Automatic  Patient Position (if appropriate) Lying  Oxygen Therapy  SpO2 100 %  O2 Device Room Air  Pain Assessment  Pain Scale 0-10  Pain Score 0  Dialysis Weight  Weight 98.8 kg  Type of Weight Post-Dialysis  During Hemodialysis Assessment  Blood Flow Rate (mL/min) 200 mL/min  Arterial Pressure (mmHg) -100 mmHg  Venous Pressure (mmHg) 120 mmHg  Transmembrane Pressure (mmHg) 60 mmHg  Ultrafiltration Rate (mL/min) 1090 mL/min  Dialysate Flow Rate (mL/min) 800 ml/min  Conductivity: Machine  14  HD Safety Checks Performed Yes  Intra-Hemodialysis Comments Tx completed  Hemodialysis Catheter Right Femoral vein Double-lumen  Placement Date/Time: 08/18/15 1144   Placed prior to admission: No  Time Out: Correct patient;Correct site;Correct procedure  Maximum sterile barrier precautions: Mask;Sterile gown;Cap;Hand hygiene;Sterile gloves;Large sterile sheet  Site Prep: Chlorh...  Site Condition No complications  Blue Lumen Status Saline locked;Flushed;Capped (Central line);Heparin locked  Red Lumen Status Saline locked;Flushed;Capped (Central line);Heparin locked  Catheter fill solution Heparin 1000 units/ml  Catheter fill volume (Arterial) 1.8 cc  Catheter fill volume (Venous) 1.8  Dressing Type Biopatch  Dressing Status Clean;Dry;Intact;Dressing changed  Post treatment catheter status Capped and Clamped

## 2018-09-30 NOTE — Progress Notes (Signed)
Post HD Tx  2574mL fluid removal during HD tx. Pt tolerated well, to specials for vascular procedure. Spoke w/ RN Crystal regarding sodium thiosulfate infusion and abx infusion on the floor - unable to do at end of HD tx (confirmed with Pharmacist as well), d/t emergency RN dialysis consult needed in ED.  CBG 70.     09/30/18 1400  Vital Signs  Pulse Rate 90  Resp 17  BP (!) 134/59  Oxygen Therapy  SpO2 97 %  Post-Hemodialysis Assessment  Rinseback Volume (mL) 250 mL  KECN 79.4 V  Dialyzer Clearance Lightly streaked  Duration of HD Treatment -hour(s) 3.5 hour(s)  Hemodialysis Intake (mL) 500 mL  UF Total -Machine (mL) 3087 mL  Net UF (mL) 2587 mL  Tolerated HD Treatment Yes  Post-Hemodialysis Comments pt stable, to specials

## 2018-09-30 NOTE — Progress Notes (Signed)
[facility]  ROUNDING NOTE   Subjective:  Patient underwent hemodialysis yesterday successfully. Due for dialysis again today. Temporary right femoral dialysis catheter placed.   Objective:  Vital signs in last 24 hours:  Temp:  [98 F (36.7 C)-99.2 F (37.3 C)] 98.8 F (37.1 C) (03/18 0930) Pulse Rate:  [88-101] 88 (03/18 0930) Resp:  [13-22] 18 (03/18 0930) BP: (103-141)/(53-91) 116/75 (03/18 0930) SpO2:  [95 %-100 %] 100 % (03/18 0930) Weight:  [89.4 kg-101.3 kg] 101.3 kg (03/18 0930)  Weight change: -0.019 kg Filed Weights   09/29/18 1545 09/30/18 0408 09/30/18 0930  Weight: 89.4 kg 101.3 kg 101.3 kg    Intake/Output: I/O last 3 completed shifts: In: 508.3 [I.V.:4.5; IV Piggyback:503.8] Out: 3300 [Other:3300]   Intake/Output this shift:  No intake/output data recorded.  Physical Exam: General: No acute distress  Head: Normocephalic, atraumatic. Moist oral mucosal membranes  Eyes: Anicteric  Neck: Supple, trachea midline  Lungs:  Clear to auscultation, normal effort  Heart: S1S2 no rubs  Abdomen:  Soft, nontender, bowel sounds present  Extremities: Trace peripheral edema, left BKA  Neurologic: Awake, alert, following commands  Skin: Prurigo nodularis  Access: Right femoral dialysis catheter, L femoral AVG    Basic Metabolic Panel: Recent Labs  Lab 09/28/18 1329 09/28/18 1625 09/29/18 0338 09/29/18 0910 09/30/18 0500  NA 138  --  138  --  139  K 4.2  --  4.4  --  4.1  CL 96*  --  99  --  98  CO2 24  --  22  --  28  GLUCOSE 163*  --  146*  --  136*  BUN 86*  --  90*  --  48*  CREATININE 12.75* 12.60* 13.81*  --  8.75*  CALCIUM 7.0*  --  7.1*  --  8.0*  PHOS  --   --   --  7.9*  --     Liver Function Tests: Recent Labs  Lab 09/28/18 1329  AST 16  ALT 14  ALKPHOS 60  BILITOT 0.5  PROT 7.4  ALBUMIN 3.3*   No results for input(s): LIPASE, AMYLASE in the last 168 hours. No results for input(s): AMMONIA in the last 168  hours.  CBC: Recent Labs  Lab 09/28/18 1329 09/28/18 1625 09/29/18 0338 09/30/18 0500  WBC 10.9* 10.4 8.6 7.6  NEUTROABS 8.6*  --   --   --   HGB 8.4* 8.6* 7.5* 8.0*  HCT 27.4* 28.0* 24.7* 26.2*  MCV 93.5 93.0 93.9 94.2  PLT 269 269 248 244    Cardiac Enzymes: No results for input(s): CKTOTAL, CKMB, CKMBINDEX, TROPONINI in the last 168 hours.  BNP: Invalid input(s): POCBNP  CBG: Recent Labs  Lab 09/28/18 2125 09/29/18 0730 09/29/18 1640 09/29/18 2129 09/30/18 0742  GLUCAP 148* 137* 151* 153* 102*    Microbiology: Results for orders placed or performed during the hospital encounter of 09/28/18  Blood culture (routine x 2)     Status: None (Preliminary result)   Collection Time: 09/28/18  2:41 PM  Result Value Ref Range Status   Specimen Description BLOOD RIGHT ANTECUBITAL  Final   Special Requests   Final    BOTTLES DRAWN AEROBIC AND ANAEROBIC Blood Culture results may not be optimal due to an excessive volume of blood received in culture bottles   Culture   Final    NO GROWTH 2 DAYS Performed at [facility], [address], [place], [place] [address]    Report  Status PENDING  Incomplete  Blood culture (routine x 2)     Status: None (Preliminary result)   Collection Time: 09/28/18  4:25 PM  Result Value Ref Range Status   Specimen Description BLOOD RIGHT ANTECUBITAL  Final   Special Requests   Final    BOTTLES DRAWN AEROBIC AND ANAEROBIC Blood Culture results may not be optimal due to an inadequate volume of blood received in culture bottles   Culture   Final    NO GROWTH 2 DAYS Performed at [facility], [address] [place], [place] [address]    Report Status PENDING  Incomplete    Coagulation Studies: No results for input(s): LABPROT, INR in the last 72 hours.  Urinalysis: No results for input(s): COLORURINE, LABSPEC, PHURINE, GLUCOSEU, HGBUR, BILIRUBINUR, KETONESUR, PROTEINUR, UROBILINOGEN, NITRITE, LEUKOCYTESUR in the last 72  hours.  Invalid input(s): APPERANCEUR    Imaging: No results found.   Medications:   . sodium chloride Stopped (09/28/18 2333)  .  ceFAZolin (ANCEF) IV    . ceFEPime (MAXIPIME) IV Stopped (09/28/18 2325)  . sodium thiosulfate infusion for calciphylaxis    . vancomycin     . Chlorhexidine Gluconate Cloth  6 each Topical Q0600  . epoetin (EPOGEN/PROCRIT) injection  10,000 Units Intravenous Q M,W,F-HD  . epoetin (EPOGEN/PROCRIT) injection  10,000 Units Intravenous Q M,W,F-HD  . ferric citrate  210 mg Oral TID WC  . heparin  5,000 Units Subcutaneous Q8H  . insulin aspart  0-5 Units Subcutaneous QHS  . insulin aspart  0-9 Units Subcutaneous TID WC  . metroNIDAZOLE  500 mg Oral q morning - 10a  . multivitamin  1 tablet Oral QHS  . nicotine  7 mg Transdermal Daily  . pantoprazole  40 mg Oral Daily  . sodium chloride flush  3 mL Intravenous Q12H   sodium chloride, acetaminophen, albuterol, cyclobenzaprine, ondansetron **OR** ondansetron (ZOFRAN) IV, oxyCODONE-acetaminophen, polyethylene glycol, sodium chloride flush  Assessment/ Plan:  53 y.o. male with a PMHx of ESRD on HD MWF, anemia chronic kidney disease, secondary hyperparathyroidism, diabetes mellitus type 2, calciphylaxis, left BKA, left thigh graft, who was admitted to [facility] on 09/28/2018 for evaluation of drainage from left groin AV graft.  [facility]/MWF  1.  ESRD on HD.    Patient underwent hemodialysis yesterday.  We will plan for dialysis again today using his right femoral dialysis catheter.  Thereafter resume dialysis on Friday.  2.  Calciphylaxis.    Continue sodium thiosulfate 25 g IV with dialysis treatments.  3.  Anemia chronic kidney disease.  Maintain the patient on Epogen 10,000 units IV with dialysis.  4.  Secondary hyperparathyroidism.    Increase Auryxia to 3 tablets p.o. 3 times daily with meals.  5.  complication dialysis device.  Patient for fistulogram later today after  dialysis.   LOS: 2 [name] 3/18/202010:47 AM

## 2018-09-30 NOTE — Progress Notes (Signed)
Post HD Assessment  2557mL fluid removal during HD tx. Pt tolerated well, to specials for vascular procedure. Spoke w/ RN Crystal regarding sodium thiosulfate infusion and abx infusion on the floor - unable to do at end of HD tx (confirmed with Pharmacist as well), d/t emergency RN dialysis consult needed in ED.  CBG 70.      09/30/18 1415  Neurological  Level of Consciousness Alert  Orientation Level Oriented X4  Respiratory  Respiratory Pattern Regular;Unlabored  Chest Assessment Chest expansion symmetrical  Bilateral Breath Sounds Clear;Diminished  Cardiac  Pulse Regular  ECG Monitor Yes  Cardiac Rhythm NSR  Vascular  R Radial Pulse +2  L Radial Pulse +2  Integumentary  Integumentary (WDL) X  Skin Color Appropriate for ethnicity  Skin Condition Dry  Musculoskeletal  Musculoskeletal (WDL) X  Generalized Weakness Yes  Gastrointestinal  Bowel Sounds Assessment Active  GU Assessment  Genitourinary (WDL) X  Genitourinary Symptoms Anuria (HD pt of 14 years)  Psychosocial  Psychosocial (WDL) WDL  Wound / Incision (Open or Dehisced) 09/28/18 Other (Comment)  Date First Assessed/Time First Assessed: 09/28/18 1730   Wound Type: (c) Other (Comment)  Dressing Type Other (Comment) (Changed@HD  tx.Vaseline gauze, 4x4 gauze+kerlix)  Dressing Change Frequency Daily  Drainage Amount None

## 2018-09-30 NOTE — H&P (Signed)
[facility] History & Physical Update  The patient was interviewed and re-examined.  The patient's previous History and Physical has been reviewed and is unchanged.  There is no change in the plan of care. We plan to proceed with the scheduled procedure.  Leotis Pain, MD  09/30/2018, 2:55 PM

## 2018-09-30 NOTE — Op Note (Signed)
[facility]    OPERATIVE NOTE   PROCEDURE: 1.  Left thigh arteriovenous graft cannulation under ultrasound guidance x 2 2.  Left leg shuntogram 3.  Catheter directed thrombolytic therapy to the left thigh AV graft using 14 mg of TPA instilled in the graft  PRE-OPERATIVE DIAGNOSIS: 1. ESRD 2. Malfunctioning left thigh arteriovenous graft with open wounds and pain  POST-OPERATIVE DIAGNOSIS: same as above with thrombosis of the left thigh AV graft  SURGEON: Leotis Pain, MD  ANESTHESIA: local with MCS  ESTIMATED BLOOD LOSS: 2 cc  FINDING(S): 1. Thrombosis of the left thigh AV graft.  The venous outflow from the common femoral vein more proximal clearing the iliac veins and the IVC were widely patent.  SPECIMEN(S):  None  CONTRAST: 10 cc  FLUORO TIME: 0.3 minutes  MODERATE CONSCIOUS SEDATION TIME:  Approximately 20 minutes using 3 mg of Versed and 100 Mcg of Fentanyl  INDICATIONS: [name] is a 53 y.o. male who presents with malfunctioning left thigh arteriovenous graft.  He has an open wound and a lot of pain.  The patient is scheduled for left thigh shuntogram.  The patient is aware the risks include but are not limited to: bleeding, infection, thrombosis of the cannulated access, and possible anaphylactic reaction to the contrast.  The patient is aware of the risks of the procedure and elects to proceed forward.  DESCRIPTION: After full informed written consent was obtained, the patient was brought back to the angiography suite and placed supine upon the angiography table.  The patient was connected to monitoring equipment. Moderate conscious sedation was administered during a face to face encounter throughout the procedure with my supervision of the RN administering medicines and monitoring the patient's vital signs, pulse oximetry, telemetry and mental status throughout from the start of the procedure until the patient was taken to the recovery room  The left thigh and groin was prepped and draped in the standard fashion for a percutaneous access intervention.  Under ultrasound guidance, the left thigh arteriovenous graft was cannulated with a micropuncture needle under direct ultrasound guidance and a permanent image was performed.  It was clear that the graft was thrombosed with ultrasound so I initially started by accessing in an antegrade fashion near the venous anastomosis to evaluate the venous outflow.  The microwire was advanced into the graft and the needle was exchanged for the a microsheath.  Hand injections were completed to image the access including the central venous system. This demonstrated patency of the venous outflow from the common femoral vein up through the iliac veins including the vena cava.  I then remove the sheath.  I elected to perform catheter directed thrombolytic therapy to the graft to try to open this and allow an easier surgical revision.  Using ultrasound guidance, I accessed the graft just beyond the arterial anastomosis in an antegrade fashion with a micropuncture needle and a permanent image was recorded.  A micropuncture wire and sheath were then placed.  I then instilled 14 mg of TPA to the left thigh AV graft for catheter directed thrombolytic therapy and would allow this to dwell overnight in hopes of improving her chances of open surgical revision tomorrow.  A 4-0 Monocryl purse-string suture was sewn around the sheath.  The sheath was removed while tying down the suture.  A sterile bandage was applied to the puncture site.  COMPLICATIONS: None  CONDITION: Stable   Leotis Pain  09/30/2018 4:59 PM  This note was created with Dragon Medical transcription system. Any errors in dictation are purely unintentional.

## 2018-10-01 ENCOUNTER — Encounter
Admission: EM | Disposition: A | Discharge status: Home Health | Payer: Self-pay | Source: Home / Self Care | Attending: Internal Medicine

## 2018-10-01 ENCOUNTER — Other Ambulatory Visit (INDEPENDENT_AMBULATORY_CARE_PROVIDER_SITE_OTHER): Payer: Self-pay | Admitting: Vascular Surgery

## 2018-10-01 ENCOUNTER — Inpatient Hospital Stay: Payer: Medicare Other | Admitting: Anesthesiology

## 2018-10-01 ENCOUNTER — Encounter: Payer: Self-pay | Admitting: Vascular Surgery

## 2018-10-01 DIAGNOSIS — Z992 Dependence on renal dialysis: Secondary | ICD-10-CM

## 2018-10-01 DIAGNOSIS — T82868A Thrombosis of vascular prosthetic devices, implants and grafts, initial encounter: Secondary | ICD-10-CM

## 2018-10-01 DIAGNOSIS — S71102A Unspecified open wound, left thigh, initial encounter: Secondary | ICD-10-CM

## 2018-10-01 DIAGNOSIS — N186 End stage renal disease: Secondary | ICD-10-CM

## 2018-10-01 HISTORY — PX: AV FISTULA PLACEMENT: SHX1204

## 2018-10-01 LAB — CBC
HCT: 29.2 % — ABNORMAL LOW (ref 39.0–52.0)
Hemoglobin: 9.2 g/dL — ABNORMAL LOW (ref 13.0–17.0)
MCH: 28.5 pg (ref 26.0–34.0)
MCHC: 31.5 g/dL (ref 30.0–36.0)
MCV: 90.4 fL (ref 80.0–100.0)
Platelets: 210 10*3/uL (ref 150–400)
RBC: 3.23 MIL/uL — ABNORMAL LOW (ref 4.22–5.81)
RDW: 16.4 % — ABNORMAL HIGH (ref 11.5–15.5)
WBC: 10.7 10*3/uL — AB (ref 4.0–10.5)
nRBC: 0 % (ref 0.0–0.2)

## 2018-10-01 LAB — CBC WITH DIFFERENTIAL/PLATELET
ABS IMMATURE GRANULOCYTES: 0.05 10*3/uL (ref 0.00–0.07)
Basophils Absolute: 0 10*3/uL (ref 0.0–0.1)
Basophils Relative: 1 %
Eosinophils Absolute: 0.3 10*3/uL (ref 0.0–0.5)
Eosinophils Relative: 4 %
HCT: 25.1 % — ABNORMAL LOW (ref 39.0–52.0)
Hemoglobin: 7.8 g/dL — ABNORMAL LOW (ref 13.0–17.0)
IMMATURE GRANULOCYTES: 1 %
LYMPHS PCT: 14 %
Lymphs Abs: 1.2 10*3/uL (ref 0.7–4.0)
MCH: 28.4 pg (ref 26.0–34.0)
MCHC: 31.1 g/dL (ref 30.0–36.0)
MCV: 91.3 fL (ref 80.0–100.0)
Monocytes Absolute: 0.9 10*3/uL (ref 0.1–1.0)
Monocytes Relative: 11 %
Neutro Abs: 5.7 10*3/uL (ref 1.7–7.7)
Neutrophils Relative %: 69 %
Platelets: 232 10*3/uL (ref 150–400)
RBC: 2.75 MIL/uL — ABNORMAL LOW (ref 4.22–5.81)
RDW: 16.6 % — ABNORMAL HIGH (ref 11.5–15.5)
WBC: 8.1 10*3/uL (ref 4.0–10.5)
nRBC: 0 % (ref 0.0–0.2)

## 2018-10-01 LAB — BASIC METABOLIC PANEL
Anion gap: 21 — ABNORMAL HIGH (ref 5–15)
BUN: 31 mg/dL — ABNORMAL HIGH (ref 6–20)
CHLORIDE: 97 mmol/L — AB (ref 98–111)
CO2: 23 mmol/L (ref 22–32)
Calcium: 8.8 mg/dL — ABNORMAL LOW (ref 8.9–10.3)
Creatinine, Ser: 6.49 mg/dL — ABNORMAL HIGH (ref 0.61–1.24)
GFR calc Af Amer: 10 mL/min — ABNORMAL LOW (ref >60)
GFR calc non Af Amer: 9 mL/min — ABNORMAL LOW (ref >60)
Glucose, Bld: 115 mg/dL — ABNORMAL HIGH (ref 70–99)
POTASSIUM: 4.3 mmol/L (ref 3.5–5.1)
Sodium: 141 mmol/L (ref 135–145)

## 2018-10-01 LAB — GLUCOSE, CAPILLARY
Glucose-Capillary: 105 mg/dL — ABNORMAL HIGH (ref 70–99)
Glucose-Capillary: 84 mg/dL (ref 70–99)
Glucose-Capillary: 83 mg/dL (ref 70–99)
Glucose-Capillary: 69 mg/dL — ABNORMAL LOW (ref 70–99)
Glucose-Capillary: 91 mg/dL (ref 70–99)
Glucose-Capillary: 115 mg/dL — ABNORMAL HIGH (ref 70–99)

## 2018-10-01 LAB — PROTIME-INR
INR: 1.2 (ref 0.8–1.2)
Prothrombin Time: 14.9 seconds (ref 11.4–15.2)

## 2018-10-01 LAB — PREPARE RBC (CROSSMATCH)

## 2018-10-01 LAB — APTT: aPTT: 42 seconds — ABNORMAL HIGH (ref 24–36)

## 2018-10-01 SURGERY — INSERTION OF ARTERIOVENOUS (AV) GORE-TEX GRAFT THIGH
Anesthesia: General | Laterality: Left

## 2018-10-01 SURGERY — REVISION OF ARTERIOVENOUS GORETEX GRAFT
Anesthesia: Choice

## 2018-10-01 MED ORDER — CHLORHEXIDINE GLUCONATE CLOTH 2 % EX PADS: 6.0000 | MEDICATED_PAD | Freq: Once | CUTANEOUS | Status: DC

## 2018-10-01 MED ORDER — DEXTROSE 50 % IV SOLN: 25.0000 mL | Freq: Once | INTRAVENOUS | Status: DC

## 2018-10-01 MED ORDER — ONDANSETRON HCL 4 MG/2ML IJ SOLN
INTRAMUSCULAR | Status: DC | PRN
  Administered 2018-10-01: 4 mg via INTRAVENOUS

## 2018-10-01 MED ORDER — FENTANYL CITRATE (PF) 100 MCG/2ML IJ SOLN
INTRAMUSCULAR | Status: AC
  Filled 2018-10-01: qty 2

## 2018-10-01 MED ORDER — ONDANSETRON HCL 4 MG/2ML IJ SOLN: 4.0000 mg | Freq: Once | INTRAMUSCULAR | Status: DC | PRN

## 2018-10-01 MED ORDER — FENTANYL CITRATE (PF) 100 MCG/2ML IJ SOLN
25.0000 ug | INTRAMUSCULAR | Status: DC | PRN
  Administered 2018-10-01 (×3): 50 ug via INTRAVENOUS

## 2018-10-01 MED ORDER — FENTANYL CITRATE (PF) 100 MCG/2ML IJ SOLN
INTRAMUSCULAR | Status: AC
  Administered 2018-10-01: 50 ug via INTRAVENOUS
  Filled 2018-10-01: qty 2

## 2018-10-01 MED ORDER — CEFAZOLIN SODIUM-DEXTROSE 2-4 GM/100ML-% IV SOLN
2.0000 g | INTRAVENOUS | Status: DC
  Filled 2018-10-01: qty 100

## 2018-10-01 MED ORDER — DEXTROSE 50 % IV SOLN
INTRAVENOUS | Status: AC
  Filled 2018-10-01: qty 50

## 2018-10-01 MED ORDER — SODIUM CHLORIDE 0.9 % IV SOLN
INTRAVENOUS | Status: DC | PRN
  Administered 2018-10-01: 25 ug/min via INTRAVENOUS

## 2018-10-01 MED ORDER — PHENYLEPHRINE HCL 10 MG/ML IJ SOLN
INTRAMUSCULAR | Status: AC
  Filled 2018-10-01: qty 1

## 2018-10-01 MED ORDER — EVICEL 5 ML EX KIT
PACK | CUTANEOUS | Status: DC | PRN
  Administered 2018-10-01: 1

## 2018-10-01 MED ORDER — MIDAZOLAM HCL 2 MG/2ML IJ SOLN
INTRAMUSCULAR | Status: AC
  Filled 2018-10-01: qty 2

## 2018-10-01 MED ORDER — GLYCOPYRROLATE 0.2 MG/ML IJ SOLN
INTRAMUSCULAR | Status: DC | PRN
  Administered 2018-10-01: 0.2 mg via INTRAVENOUS

## 2018-10-01 MED ORDER — PROPOFOL 500 MG/50ML IV EMUL
INTRAVENOUS | Status: DC | PRN
  Administered 2018-10-01: 25 ug/kg/min via INTRAVENOUS

## 2018-10-01 MED ORDER — BUPIVACAINE HCL (PF) 0.5 % IJ SOLN
INTRAMUSCULAR | Status: AC
  Filled 2018-10-01: qty 30

## 2018-10-01 MED ORDER — CHLORHEXIDINE GLUCONATE CLOTH 2 % EX PADS: 6.0000 | MEDICATED_PAD | Freq: Once | CUTANEOUS | Status: AC

## 2018-10-01 MED ORDER — DIPHENHYDRAMINE HCL 25 MG PO CAPS
25.0000 mg | ORAL_CAPSULE | Freq: Four times a day (QID) | ORAL | Status: DC | PRN
  Administered 2018-10-01 – 2018-10-05 (×4): 25 mg via ORAL
  Filled 2018-10-01 (×4): qty 1

## 2018-10-01 MED ORDER — EPINEPHRINE PF 1 MG/ML IJ SOLN
INTRAMUSCULAR | Status: AC
  Filled 2018-10-01: qty 1

## 2018-10-01 MED ORDER — BACITRACIN ZINC 500 UNIT/GM EX OINT
TOPICAL_OINTMENT | CUTANEOUS | Status: AC
  Filled 2018-10-01: qty 28.35

## 2018-10-01 MED ORDER — PHENYLEPHRINE HCL 10 MG/ML IJ SOLN
INTRAMUSCULAR | Status: DC | PRN
  Administered 2018-10-01 (×3): 150 ug via INTRAVENOUS
  Administered 2018-10-01 (×2): 100 ug via INTRAVENOUS

## 2018-10-01 MED ORDER — SODIUM CHLORIDE 0.9 % IV SOLN
INTRAVENOUS | Status: DC
  Administered 2018-10-01: 14:00:00 via INTRAVENOUS

## 2018-10-01 MED ORDER — LIDOCAINE HCL (CARDIAC) PF 100 MG/5ML IV SOSY
PREFILLED_SYRINGE | INTRAVENOUS | Status: DC | PRN
  Administered 2018-10-01: 100 mg via INTRAVENOUS

## 2018-10-01 MED ORDER — SODIUM CHLORIDE 0.9 % IV SOLN
INTRAVENOUS | Status: DC | PRN
  Administered 2018-10-01: 60 mL via INTRAMUSCULAR

## 2018-10-01 MED ORDER — PROPOFOL 10 MG/ML IV BOLUS
INTRAVENOUS | Status: AC
  Filled 2018-10-01: qty 20

## 2018-10-01 MED ORDER — PROPOFOL 10 MG/ML IV BOLUS
INTRAVENOUS | Status: DC | PRN
  Administered 2018-10-01 (×2): 30 mg via INTRAVENOUS
  Administered 2018-10-01: 100 mg via INTRAVENOUS
  Administered 2018-10-01 (×2): 30 mg via INTRAVENOUS

## 2018-10-01 MED ORDER — LIDOCAINE HCL (PF) 2 % IJ SOLN
INTRAMUSCULAR | Status: AC
  Filled 2018-10-01: qty 10

## 2018-10-01 MED ORDER — FENTANYL CITRATE (PF) 100 MCG/2ML IJ SOLN
INTRAMUSCULAR | Status: DC | PRN
  Administered 2018-10-01 (×2): 25 ug via INTRAVENOUS
  Administered 2018-10-01 (×2): 50 ug via INTRAVENOUS
  Administered 2018-10-01 (×2): 25 ug via INTRAVENOUS

## 2018-10-01 MED ORDER — MIDAZOLAM HCL 5 MG/5ML IJ SOLN
INTRAMUSCULAR | Status: DC | PRN
  Administered 2018-10-01 (×2): 1 mg via INTRAVENOUS

## 2018-10-01 MED ORDER — HYDROMORPHONE HCL 1 MG/ML IJ SOLN
1.0000 mg | INTRAMUSCULAR | Status: DC | PRN
  Administered 2018-10-01 – 2018-10-03 (×5): 1 mg via INTRAVENOUS
  Filled 2018-10-01 (×6): qty 1

## 2018-10-01 MED ORDER — HEPARIN SODIUM (PORCINE) 1000 UNIT/ML IJ SOLN
INTRAMUSCULAR | Status: DC | PRN
  Administered 2018-10-01: 3000 [IU] via INTRAVENOUS

## 2018-10-01 MED ORDER — SODIUM CHLORIDE 0.9% IV SOLUTION: Freq: Once | INTRAVENOUS | Status: DC

## 2018-10-01 MED ORDER — DEXTROSE 5 % IV SOLN: 2.0000 g | INTRAVENOUS | Status: DC

## 2018-10-01 MED ORDER — HEPARIN SODIUM (PORCINE) 5000 UNIT/ML IJ SOLN
INTRAMUSCULAR | Status: AC
  Filled 2018-10-01: qty 1

## 2018-10-01 SURGICAL SUPPLY — 58 items
ADH SKN CLS APL DERMABOND .7 (GAUZE/BANDAGES/DRESSINGS) ×1
APL PRP STRL LF DISP 70% ISPRP (MISCELLANEOUS) ×1
BAG DECANTER FOR FLEXI CONT (MISCELLANEOUS) ×4 IMPLANT
BLADE SURG SZ11 CARB STEEL (BLADE) ×4 IMPLANT
BOOT SUTURE AID YELLOW STND (SUTURE) ×4 IMPLANT
BRUSH SCRUB EZ  4% CHG (MISCELLANEOUS) ×2
BRUSH SCRUB EZ 4% CHG (MISCELLANEOUS) ×2 IMPLANT
CANISTER SUCT 1200ML W/VALVE (MISCELLANEOUS) ×4 IMPLANT
CHLORAPREP W/TINT 26 (MISCELLANEOUS) ×4 IMPLANT
CLIP SPRNG 6 S-JAW DBL (CLIP) ×1 IMPLANT
CLIP SPRNG 6MM S-JAW DBL (CLIP) ×3
COVER WAND RF STERILE (DRAPES) ×4 IMPLANT
DECANTER SPIKE VIAL GLASS SM (MISCELLANEOUS) ×4 IMPLANT
DERMABOND ADVANCED (GAUZE/BANDAGES/DRESSINGS) ×2
DERMABOND ADVANCED .7 DNX12 (GAUZE/BANDAGES/DRESSINGS) ×2 IMPLANT
ELECT CAUTERY BLADE 6.4 (BLADE) ×4 IMPLANT
ELECT REM PT RETURN 9FT ADLT (ELECTROSURGICAL) ×3
ELECTRODE REM PT RTRN 9FT ADLT (ELECTROSURGICAL) ×2 IMPLANT
GEL ULTRASOUND 20GR AQUASONIC (MISCELLANEOUS) ×4 IMPLANT
GLOVE BIO SURGEON STRL SZ7 (GLOVE) ×8 IMPLANT
GLOVE INDICATOR 7.5 STRL GRN (GLOVE) ×4 IMPLANT
GOWN STRL REUS W/ TWL LRG LVL3 (GOWN DISPOSABLE) ×4 IMPLANT
GOWN STRL REUS W/ TWL XL LVL3 (GOWN DISPOSABLE) ×2 IMPLANT
GOWN STRL REUS W/TWL LRG LVL3 (GOWN DISPOSABLE) ×6
GOWN STRL REUS W/TWL XL LVL3 (GOWN DISPOSABLE) ×3
HEMOSTAT SURGICEL 2X3 (HEMOSTASIS) ×4 IMPLANT
IV NS 500ML (IV SOLUTION) ×3
IV NS 500ML BAXH (IV SOLUTION) ×2 IMPLANT
KIT TURNOVER KIT A (KITS) ×4 IMPLANT
LABEL OR SOLS (LABEL) ×4 IMPLANT
LOOP RED MAXI  1X406MM (MISCELLANEOUS) ×2
LOOP VESSEL MAXI 1X406 RED (MISCELLANEOUS) ×2 IMPLANT
LOOP VESSEL MINI 0.8X406 BLUE (MISCELLANEOUS) ×2 IMPLANT
LOOPS BLUE MINI 0.8X406MM (MISCELLANEOUS) ×2
NDL FILTER BLUNT 18X1 1/2 (NEEDLE) ×1 IMPLANT
NEEDLE FILTER BLUNT 18X 1/2SAF (NEEDLE) ×2
NEEDLE FILTER BLUNT 18X1 1/2 (NEEDLE) ×1 IMPLANT
NS IRRIG 500ML POUR BTL (IV SOLUTION) ×4 IMPLANT
PACK EXTREMITY ARMC (MISCELLANEOUS) ×4 IMPLANT
PAD PREP 24X41 OB/GYN DISP (PERSONAL CARE ITEMS) ×4 IMPLANT
SOLUTION CELL SAVER (CLIP) ×1 IMPLANT
STOCKINETTE 48X4 2 PLY STRL (GAUZE/BANDAGES/DRESSINGS) ×1 IMPLANT
STOCKINETTE STRL 4IN 9604848 (GAUZE/BANDAGES/DRESSINGS) ×3 IMPLANT
SUT GORETEX CV-6TTC-13 36IN (SUTURE) IMPLANT
SUT MNCRL AB 4-0 PS2 18 (SUTURE) ×4 IMPLANT
SUT PROLENE 6 0 BV (SUTURE) ×16 IMPLANT
SUT SILK 2 0 (SUTURE) ×3
SUT SILK 2 0 SH (SUTURE) ×4 IMPLANT
SUT SILK 2-0 18XBRD TIE 12 (SUTURE) ×2 IMPLANT
SUT SILK 3 0 (SUTURE) ×3
SUT SILK 3-0 18XBRD TIE 12 (SUTURE) ×2 IMPLANT
SUT SILK 4 0 (SUTURE) ×3
SUT SILK 4-0 18XBRD TIE 12 (SUTURE) ×2 IMPLANT
SUT VIC AB 3-0 SH 27 (SUTURE) ×3
SUT VIC AB 3-0 SH 27X BRD (SUTURE) ×2 IMPLANT
SYR 20CC LL (SYRINGE) ×4 IMPLANT
SYR 3ML LL SCALE MARK (SYRINGE) ×4 IMPLANT
SYRINGE IRR TOOMEY STRL 70CC (SYRINGE) IMPLANT

## 2018-10-01 SURGICAL SUPPLY — 49 items
BAG DECANTER FOR FLEXI CONT (MISCELLANEOUS) ×2 IMPLANT
BLADE SURG 11 STRL SS SAFETY (MISCELLANEOUS) ×2 IMPLANT
BNDG GAUZE 4.5X4.1 6PLY STRL (MISCELLANEOUS) ×2 IMPLANT
BOOT SUTURE AID YELLOW STND (SUTURE) ×2 IMPLANT
CANISTER SUCT 1200ML W/VALVE (MISCELLANEOUS) ×2 IMPLANT
CATH EMBOLECTOMY 4X80 (CATHETERS) ×2 IMPLANT
CATH IRRIGATION 4FR (CATHETERS) ×2 IMPLANT
CLIP SPRNG 6 S-JAW DBL (CLIP) IMPLANT
CLIP SPRNG 6MM S-JAW DBL (CLIP) ×3
COVER WAND RF STERILE (DRAPES) ×1 IMPLANT
DRSG OPSITE POSTOP 4X8 (GAUZE/BANDAGES/DRESSINGS) ×2 IMPLANT
ELECT CAUTERY BLADE 6.4 (BLADE) ×2 IMPLANT
ELECT REM PT RETURN 9FT ADLT (ELECTROSURGICAL) ×3
ELECTRODE REM PT RTRN 9FT ADLT (ELECTROSURGICAL) IMPLANT
GAUZE SPONGE 4X4 12PLY STRL (GAUZE/BANDAGES/DRESSINGS) ×2 IMPLANT
GLOVE BIO SURGEON STRL SZ7 (GLOVE) ×2 IMPLANT
GLOVE INDICATOR 7.5 STRL GRN (GLOVE) ×2 IMPLANT
GLOVE SURG SYN 6.5 ES PF (GLOVE) ×3 IMPLANT
GLOVE SURG SYN 6.5 PF PI (GLOVE) IMPLANT
GOWN STRL REUS W/ TWL LRG LVL3 (GOWN DISPOSABLE) IMPLANT
GOWN STRL REUS W/ TWL XL LVL3 (GOWN DISPOSABLE) IMPLANT
GOWN STRL REUS W/TWL LRG LVL3 (GOWN DISPOSABLE) ×6
GOWN STRL REUS W/TWL XL LVL3 (GOWN DISPOSABLE) ×3
GRAFT COLLAGEN VASCULAR 8X40 (Vascular Products) ×2 IMPLANT
HEMOSTAT SURGICEL 2X3 (HEMOSTASIS) ×2 IMPLANT
IV NS 500ML (IV SOLUTION) ×3
IV NS 500ML BAXH (IV SOLUTION) IMPLANT
KIT TURNOVER KIT A (KITS) ×2 IMPLANT
LABEL OR SOLS (LABEL) ×2 IMPLANT
LOOP RED MAXI  1X406MM (MISCELLANEOUS) ×2
LOOP VESSEL MAXI 1X406 RED (MISCELLANEOUS) IMPLANT
LOOP VESSEL MINI 0.8X406 BLUE (MISCELLANEOUS) IMPLANT
LOOPS BLUE MINI 0.8X406MM (MISCELLANEOUS) ×2
Lemaiter 4 F over the wire Embolectomy Catheter ×2 IMPLANT
NDL FILTER BLUNT 18X1 1/2 (NEEDLE) IMPLANT
NEEDLE FILTER BLUNT 18X 1/2SAF (NEEDLE) ×2
NEEDLE FILTER BLUNT 18X1 1/2 (NEEDLE) ×1 IMPLANT
PACK EXTREMITY ARMC (MISCELLANEOUS) ×2 IMPLANT
SOLUTION CELL SAVER (CLIP) ×1 IMPLANT
SPONGE LAP 18X18 RF (DISPOSABLE) ×2 IMPLANT
STAPLER SKIN PROX 35W (STAPLE) ×2 IMPLANT
STOCKINETTE 48X4 2 PLY STRL (GAUZE/BANDAGES/DRESSINGS) IMPLANT
STOCKINETTE STRL 4IN 9604848 (GAUZE/BANDAGES/DRESSINGS) ×3 IMPLANT
SUT PROLENE 6 0 BV (SUTURE) ×6 IMPLANT
SUT SILK 0 SH 30 (SUTURE) ×2 IMPLANT
SYR 20CC LL (SYRINGE) ×2 IMPLANT
SYR 3ML LL SCALE MARK (SYRINGE) ×4 IMPLANT
SYR TB 1ML 27GX1/2 LL (SYRINGE) IMPLANT
SYRINGE IRR TOOMEY STRL 70CC (SYRINGE) ×2 IMPLANT

## 2018-10-01 NOTE — Op Note (Signed)
[facility]   OPERATIVE NOTE   PROCEDURE: 1. Jump graft revision of left thigh Artegraft AV graft with 7 mm Artegraft including Fogarty embolectomy for thrombectomy both the arterial and venous portions of the old graft  2. Ligation and resection of the midportion of the old AV graft and chronic thigh wound  PRE-OPERATIVE DIAGNOSIS: 1.  Open wound overlying the left thigh AV graft 2. ESRD 3.  Calciphylaxis 4.  Multiple failed dialysis access placements with limited dialysis access  POST-OPERATIVE DIAGNOSIS: same as above   SURGEON: Leotis Pain, MD  ASSISTANT(S): Hezzie Bump, PA-C  ANESTHESIA: General  ESTIMATED BLOOD LOSS: 200 cc  FINDING(S): 1. The AV graft was well incorporated with no purulent material and no signs of obvious infection.  There was the wound overlying the midportion of the AV graft that was resected and sent as a specimen.  The graft was thrombosed.  SPECIMEN(S): Resected portion of the AV graft and skin wound. Thrombus from Fogarty embolectomy of both the arterial and venous portions of the old AV graft  INDICATIONS:   [name] is a 53 y.o. male who  presents with a chronic wound overlying his left thigh AV graft and very limited options for arteriovenous access.  In order to salvage the access and decrease the bleeding complication risks, I recommended a jump graft revision with ligation of the access.  Risk, benefits, and alternatives to access surgery were discussed.  The patient is aware the risks include but are not limited to: bleeding, infection, steal syndrome, nerve damage, ischemic monomelic neuropathy, loss of the access, need for additional procedures, death and stroke.  The patient agrees to proceed forward with the procedure. An assistant was present during the procedure to help facilitate the exposure and expedite the procedure.  DESCRIPTION: After obtaining full informed written consent, the patient was brought  back to the operating room and placed supine upon the operating table.  The patient received IV antibiotics prior to induction.  After obtaining adequate anesthesia, the patient was prepped and draped in the standard fashion for the access procedure. The assistant provided retraction and mobilization to help facilitate exposure and expedite the procedure throughout the entire procedure.  This included following suture, using retractors, and optimizing lighting. As incision was created near the arterial anastomosis prior to the segment with the open wound.  The access was encircled with vessel loops and prepared for control.  I then created an incision in the medial thigh overlying the venous portion of the graft near the previous venous anastomosis.  I then used the tunneller and tunnelled between the two incisions around the old access.  I brought a 7 mm Artegraft through the tunneller making sure to avoid twisting after marking for orientation.  The patient was then given 3000 units of intravenous heparin.  While the heparin was circulating, I made an elliptical incision around the present open wound at the apex of the old graft.  The tunnel was more proximal and thigh a few centimeters superior to this wound.  I then dissected out the open wound and the underlying Artegraft.  The Artegraft was then ligated with 2-0 silk stick ties proximally and distally and then resected overlying the open portion and that portion of the graft and the wound was sent as a specimen.  This was then closed with 3-0 Vicryl and staples.  The access was then controlled and clamped and ligated distally with a silk suture ligature.  I prepared  the end nearer the original arterial anastomosis for an anastomosis with the new 7 mm Artegraft.  Prior to this, as the graft was pulseless and thrombosed I passed a 4 Fogarty embolectomy balloon 4 times until there was no clot returned and brisk inflow.  The anastomosis was created in an end to  end fashion with two 6-0 Prolene sutures in the typical fashion.  I then flushed through the new graft and prepared this for the distal anastomosis.  The access was then divided and ligated again with a silk suture ligature.  The distal end was then prepared for anastomosis with the new graft.  As there was initially no backbleeding, I then passed a 4 Fogarty embolectomy balloon 3 times in the venous portion across the venous anastomosis.  It should be noted that both at the arterial and venous anastomosis there appeared to be some resistance consistent with possible stenosis but after 3 passes there was decent backbleeding and no residual thrombus.  An anastomosis was then created with two 6-0 Prolene sutures in the usual fashion.  The graft was flushed and de-aired prior to release of control.  Patch sutures with 6-0 Prolene sutures were used as needed for control of bleeding.  Surgicel and Evicel topical hemostatic agents were placed and hemostasis was complete.  I then closed the wounds with 3-0 Vicryl suture in the subcutaneous space and a 4-0 Monocryl suture was used to close the skin.  Dermabond was placed as a dressing.  The patient was then awakened from anesthesia and taken to the recovery room in stable condition having tolerated the procedure well.    COMPLICATIONS: none  CONDITION: stable  Leotis Pain  10/01/2018, 4:27 PM   This note was created with Dragon Medical transcription system. Any errors in dictation are purely unintentional.

## 2018-10-01 NOTE — Transfer of Care (Signed)
Immediate Anesthesia Transfer of Care Note  Patient: [name]  Procedure(s) Performed: INSERTION OF ARTERIOVENOUS (AV) GORE-TEX GRAFT THIGH REVISION (Left )  Patient Location: PACU  Anesthesia Type:General  Level of Consciousness: sedated  Airway & Oxygen Therapy: Patient Spontanous Breathing and Patient connected to face mask oxygen  Post-op Assessment: Report given to RN and Post -op Vital signs reviewed and stable  Post vital signs: Reviewed and stable  Last Vitals:  Vitals Value Taken Time  BP 120/62 10/01/2018  4:58 PM  Temp    Pulse 102 10/01/2018  5:02 PM  Resp 6 10/01/2018  5:02 PM  SpO2 100 % 10/01/2018  5:02 PM  Vitals shown include unvalidated device data.  Last Pain:  Vitals:   10/01/18 1334  TempSrc: Temporal  PainSc: 10-Worst pain ever      Patients Stated Pain Goal: 8 (40/69/86 1483)  Complications: No apparent anesthesia complications

## 2018-10-01 NOTE — Progress Notes (Signed)
[facility]  ROUNDING NOTE   Subjective:  Patient had hemodialysis yesterday. Tolerated well. Due for additional vascular intervention today.   Objective:  Vital signs in last 24 hours:  Temp:  [97.9 F (36.6 C)-98.5 F (36.9 C)] 98.2 F (36.8 C) (03/19 1334) Pulse Rate:  [77-92] 86 (03/19 1334) Resp:  [12-25] 17 (03/19 1334) BP: (109-154)/(54-89) 128/78 (03/19 1334) SpO2:  [94 %-100 %] 98 % (03/19 1334) Weight:  [99.5 kg] 99.5 kg (03/19 1334)  Weight change: 10.6 kg Filed Weights   09/30/18 1345 10/01/18 0552 10/01/18 1334  Weight: 98.8 kg 99.5 kg 99.5 kg    Intake/Output: I/O last 3 completed shifts: In: 243 [P.O.:240; I.V.:3] Out: 2587 [Other:2587]   Intake/Output this shift:  No intake/output data recorded.  Physical Exam: General: No acute distress  Head: Normocephalic, atraumatic. Moist oral mucosal membranes  Eyes: Anicteric  Neck: Supple, trachea midline  Lungs:  Clear to auscultation, normal effort  Heart: S1S2 no rubs  Abdomen:  Soft, nontender, bowel sounds present  Extremities: Trace peripheral edema, left BKA  Neurologic: Awake, alert, following commands  Skin: Prurigo nodularis  Access: Right femoral dialysis catheter, L femoral AVG    Basic Metabolic Panel: Recent Labs  Lab 09/28/18 1329 09/28/18 1625 09/29/18 0338 09/29/18 0910 09/30/18 0500 09/30/18 1056 10/01/18 0926  NA 138  --  138  --  139  --  141  K 4.2  --  4.4  --  4.1  --  4.3  CL 96*  --  99  --  98  --  97*  CO2 24  --  22  --  28  --  23  GLUCOSE 163*  --  146*  --  136*  --  115*  BUN 86*  --  90*  --  48*  --  31*  CREATININE 12.75* 12.60* 13.81*  --  8.75*  --  6.49*  CALCIUM 7.0*  --  7.1*  --  8.0*  --  8.8*  PHOS  --   --   --  7.9*  --  6.1*  --     Liver Function Tests: Recent Labs  Lab 09/28/18 1329  AST 16  ALT 14  ALKPHOS 60  BILITOT 0.5  PROT 7.4  ALBUMIN 3.3*   No results for input(s): LIPASE, AMYLASE in the last 168 hours. No  results for input(s): AMMONIA in the last 168 hours.  CBC: Recent Labs  Lab 09/28/18 1329 09/28/18 1625 09/29/18 0338 09/30/18 0500 10/01/18 0926  WBC 10.9* 10.4 8.6 7.6 8.1  NEUTROABS 8.6*  --   --   --  5.7  HGB 8.4* 8.6* 7.5* 8.0* 7.8*  HCT 27.4* 28.0* 24.7* 26.2* 25.1*  MCV 93.5 93.0 93.9 94.2 91.3  PLT 269 269 248 244 232    Cardiac Enzymes: No results for input(s): CKTOTAL, CKMB, CKMBINDEX, TROPONINI in the last 168 hours.  BNP: Invalid input(s): POCBNP  CBG: Recent Labs  Lab 09/30/18 1439 09/30/18 2108 10/01/18 0737 10/01/18 1134 10/01/18 1337  GLUCAP 86 211* 105* 84 83    Microbiology: Results for orders placed or performed during the hospital encounter of 09/28/18  Blood culture (routine x 2)     Status: None (Preliminary result)   Collection Time: 09/28/18  2:41 PM  Result Value Ref Range Status   Specimen Description BLOOD RIGHT ANTECUBITAL  Final   Special Requests   Final    BOTTLES DRAWN AEROBIC AND ANAEROBIC Blood Culture results may not  be optimal due to an excessive volume of blood received in culture bottles   Culture   Final    NO GROWTH 3 DAYS Performed at [facility], [address] [place], [place] [address]    Report Status PENDING  Incomplete  Blood culture (routine x 2)     Status: None (Preliminary result)   Collection Time: 09/28/18  4:25 PM  Result Value Ref Range Status   Specimen Description BLOOD RIGHT ANTECUBITAL  Final   Special Requests   Final    BOTTLES DRAWN AEROBIC AND ANAEROBIC Blood Culture results may not be optimal due to an inadequate volume of blood received in culture bottles   Culture   Final    NO GROWTH 3 DAYS Performed at [facility], [address] [place], [place] [address]    Report Status PENDING  Incomplete    Coagulation Studies: Recent Labs    10/01/18 0926  LABPROT 14.9  INR 1.2    Urinalysis: No results for input(s): COLORURINE, LABSPEC, PHURINE, GLUCOSEU, HGBUR,  BILIRUBINUR, KETONESUR, PROTEINUR, UROBILINOGEN, NITRITE, LEUKOCYTESUR in the last 72 hours.  Invalid input(s): APPERANCEUR    Imaging: No results found.   Medications:   . [MAR Hold] sodium chloride Stopped (09/28/18 2333)  .  ceFAZolin (ANCEF) IV    . [MAR Hold] ceFEPime (MAXIPIME) IV Stopped (09/28/18 2325)  . [MAR Hold] sodium thiosulfate infusion for calciphylaxis 25 g (09/30/18 1849)  . [MAR Hold] vancomycin     . [MAR Hold] Chlorhexidine Gluconate Cloth  6 each Topical Q0600  . Chlorhexidine Gluconate Cloth  6 each Topical Once   And  . Chlorhexidine Gluconate Cloth  6 each Topical Once  . [MAR Hold] epoetin (EPOGEN/PROCRIT) injection  10,000 Units Intravenous Q M,W,F-HD  . [MAR Hold] epoetin (EPOGEN/PROCRIT) injection  10,000 Units Intravenous Q M,W,F-HD  . [MAR Hold] ferric citrate  630 mg Oral TID WC  . [MAR Hold] heparin  5,000 Units Subcutaneous Q8H  . [MAR Hold] insulin aspart  0-5 Units Subcutaneous QHS  . [MAR Hold] insulin aspart  0-9 Units Subcutaneous TID WC  . [MAR Hold] metroNIDAZOLE  500 mg Oral q morning - 10a  . [MAR Hold] multivitamin  1 tablet Oral QHS  . [MAR Hold] nicotine  7 mg Transdermal Daily  . [MAR Hold] pantoprazole  40 mg Oral Daily  . [MAR Hold] sodium chloride flush  3 mL Intravenous Q12H   [MAR Hold] sodium chloride, [MAR Hold] acetaminophen, [MAR Hold] albuterol, [MAR Hold] cyclobenzaprine, [MAR Hold] ondansetron **OR** [MAR Hold] ondansetron (ZOFRAN) IV, [MAR Hold] ondansetron (ZOFRAN) IV, [MAR Hold] oxyCODONE-acetaminophen, [MAR Hold] polyethylene glycol, [MAR Hold] sodium chloride flush  Assessment/ Plan:  53 y.o. male with a PMHx of ESRD on HD MWF, anemia chronic kidney disease, secondary hyperparathyroidism, diabetes mellitus type 2, calciphylaxis, left BKA, left thigh graft, who was admitted to [facility] on 09/28/2018 for evaluation of drainage from left groin AV graft.  [facility]/MWF  1.  ESRD on HD.    Patient  completed dialysis yesterday.  No acute indication for dialysis today.  We will plan for dialysis again tomorrow.  2.  Calciphylaxis.    Continue sodium thiosulfate 25 g IV with dialysis treatments.  3.  Anemia chronic kidney disease. Hemoglobin currently 7.8. Maintain the patient on Epogen 10,000 units IV with dialysis.  4.  Secondary hyperparathyroidism.    Phosphorus is 6.1.Continue Auryxia 3 tablets p.o. 3 times daily with meals.  5.  complication dialysis device.  Further plan per vascular surgery.    LOS: 3 Arleta Ostrum 3/19/20201:46 PM

## 2018-10-01 NOTE — Anesthesia Postprocedure Evaluation (Signed)
Anesthesia Post Note  Patient: [name]  Procedure(s) Performed: INSERTION OF ARTERIOVENOUS (AV) GORE-TEX GRAFT THIGH REVISION (Left )  Patient location during evaluation: PACU Anesthesia Type: General Level of consciousness: awake and alert Pain management: pain level controlled Vital Signs Assessment: post-procedure vital signs reviewed and stable Respiratory status: spontaneous breathing, nonlabored ventilation, respiratory function stable and patient connected to nasal cannula oxygen Cardiovascular status: blood pressure returned to baseline and stable Postop Assessment: no apparent nausea or vomiting Anesthetic complications: no     Last Vitals:  Vitals:   10/01/18 1801 10/01/18 1954  BP: 118/71 119/66  Pulse: (!) 101 96  Resp: 16 14  Temp: 36.9 C (!) 36.4 C  SpO2: 94% 100%    Last Pain:  Vitals:   10/01/18 2006  TempSrc:   PainSc: 10-Worst pain ever                 Martha Clan

## 2018-10-01 NOTE — Progress Notes (Signed)
[place] [facility] [place] NAME: [name]    MR#:  [id number]  DATE OF BIRTH:  [birth year]  SUBJECTIVE:   Patient underwent thrombectomy yesterday Pain controlled Plan for revision today Currently receiving TPA  REVIEW OF SYSTEMS:    Review of Systems  Constitutional: Negative for fever, chills weight loss HENT: Negative for ear pain, nosebleeds, congestion, facial swelling, rhinorrhea, neck pain, neck stiffness and ear discharge.   Respiratory: Negative for cough, shortness of breath, wheezing  Cardiovascular: Negative for chest pain, palpitations and leg swelling.  Gastrointestinal: Negative for heartburn, abdominal pain, vomiting, diarrhea or consitpation Genitourinary: Negative for dysuria, urgency, frequency, hematuria Musculoskeletal: Negative for back pain or joint pain Neurological: Negative for dizziness, seizures, syncope, focal weakness,  numbness and headaches.  Hematological: Does not bruise/bleed easily.  Psychiatric/Behavioral: Negative for hallucinations, confusion, dysphoric mood SKIN graft infection/ulcer thigh Tolerating Diet: yes     DRUG ALLERGIES:   Allergies  Allergen Reactions  . Hydrocodone Nausea And Vomiting    VITALS:  Blood pressure 121/66, pulse 82, temperature 98.5 F (36.9 C), temperature source Oral, resp. rate 19, height 6\' 1"  (1.854 m), weight 99.5 kg, SpO2 98 %.  PHYSICAL EXAMINATION:  Constitutional: Appears well-developed and well-nourished. No distress. HENT: Normocephalic. Marland Kitchen Oropharynx is clear and moist.  Eyes: Conjunctivae and EOM are normal. PERRLA, no scleral icterus.  Neck: Normal ROM. Neck supple. No JVD. No tracheal deviation. CVS: RRR, S1/S2 +, no murmurs, no gallops, no carotid bruit.  Pulmonary: Effort and breath sounds normal, no stridor, rhonchi, wheezes, rales.  Abdominal: Soft. BS +,  no distension, tenderness, rebound or guarding.  Musculoskeletal: Normal range of  motion. Right BKA No edema and no tenderness.  Neuro: Alert. CN 2-12 grossly intact. No focal deficits. Skin: large necrotic wound on the right thigh and in the right groin which is covered. Calciphylaxis Stump clean and intact Psychiatric: Normal mood and affect.  Right femoral catheter    LABORATORY PANEL:   CBC Recent Labs  Lab 10/01/18 0926  WBC 8.1  HGB 7.8*  HCT 25.1*  PLT 232   ------------------------------------------------------------------------------------------------------------------  Chemistries  Recent Labs  Lab 09/28/18 1329  10/01/18 0926  NA 138   < > 141  K 4.2   < > 4.3  CL 96*   < > 97*  CO2 24   < > 23  GLUCOSE 163*   < > 115*  BUN 86*   < > 31*  CREATININE 12.75*   < > 6.49*  CALCIUM 7.0*   < > 8.8*  AST 16  --   --   ALT 14  --   --   ALKPHOS 60  --   --   BILITOT 0.5  --   --    < > = values in this interval not displayed.   ------------------------------------------------------------------------------------------------------------------  Cardiac Enzymes No results for input(s): TROPONINI in the last 168 hours. ------------------------------------------------------------------------------------------------------------------  RADIOLOGY:  No results found.   ASSESSMENT AND PLAN:   53 year old male with end-stage renal disease on hemodialysis, tobacco dependence and obesity who presented for emergent room from dialysis due to fistula ulceration/infection  1.  Infected left groin AV fistula with ulceration and drainage: He is s/p[ thrombectomy 3/18 due to Malfunctioning left thigh arteriovenous graft with open wounds  Going for surgical revision today. Blood cultures have been negative  Continue cefepime/Flagyl and vancomycin  As per wound care consult:  Silver hydrofiber for exudate management.  crushed Flagyl  for odor control,  2.  End-stage renal disease on hemodialysis: Continue hemodialysis via temporary catheter  3.   Diabetes: Continue sliding scale   4. Tobacco dependence: Patient is encouraged to quit smoking and willing to attempt to quit was assessed. Patient highly motivated.Counseling was provided for 4 minutes. Continue nicotoine patch  5.  Anemia of chronic disease: Continue Epogen  6.  Calciphylaxis: Continue sodium p.o. sulfate  Management plans discussed with the patient and he is in agreement.  CODE STATUS: full  TOTAL TIME TAKING CARE OF THIS PATIENT: 25  minutes.   D/w dr dew this am  POSSIBLE D/C 2-4 days, DEPENDING ON CLINICAL CONDITION.   Bettey Costa M.D on 10/01/2018 at 10:52 AM  Between 7am to 6pm - Pager - [phone] After 6pm go to [email or web address] - password [email or web address] [facility]  Office  [phone]  CC: Primary care physician; Sandi Mariscal, MD  Note: This dictation was prepared with Dragon dictation along with smaller phrase technology. Any transcriptional errors that result from this process are unintentional.

## 2018-10-01 NOTE — Anesthesia Preprocedure Evaluation (Signed)
Anesthesia Evaluation  Patient identified by MRN, date of birth, ID band Patient awake    Reviewed: Allergy & Precautions, NPO status , Patient's Chart, lab work & pertinent test results  History of Anesthesia Complications Negative for: history of anesthetic complications  Airway Mallampati: III  TM Distance: >3 FB Neck ROM: Full    Dental  (+) Poor Dentition, Missing   Pulmonary neg sleep apnea, neg COPD, Current Smoker,    breath sounds clear to auscultation- rhonchi (-) wheezing      Cardiovascular + Peripheral Vascular Disease and +CHF  (-) CAD, (-) Past MI, (-) Cardiac Stents and (-) CABG  Rhythm:Regular Rate:Normal - Systolic murmurs and - Diastolic murmurs Echo 11/17/95: - Left ventricle: Systolic function was mildly to moderately   reduced. The estimated ejection fraction was in the range of 40%   to 45%. Hypokinesis of the anteroseptal myocardium. Hypokinesis   of the lateral myocardium. - Mitral valve: There was moderate regurgitation, with multiple   jets. - Left atrium: The atrium was moderately dilated. No evidence of   thrombus in the atrial cavity or appendage. - Right atrium: The atrium was mildly to moderately dilated. No   evidence of thrombus in the atrial cavity or appendage; the   abnormality has resolved sincethe study of February 2017. There   was mild spontaneous echo contrast ("smoke"). - Atrial septum: The septum bowed from left to right, consistent   with increased left atrial pressure. No defect or patent foramen   ovale was identified. Agitated saline contrast study showed no   shunt. - Tricuspid valve: There was moderate regurgitation directed   eccentrically and toward the septum. - Superior vena cava: The study excluded a thrombus.   Neuro/Psych neg Seizures negative neurological ROS  negative psych ROS   GI/Hepatic Neg liver ROS, GERD  ,  Endo/Other  diabetes, Insulin Dependent   Renal/GU ESRF and DialysisRenal disease     Musculoskeletal negative musculoskeletal ROS (+)   Abdominal (+) - obese,   Peds  Hematology  (+) anemia ,   Anesthesia Other Findings Past Medical History: No date: Amputee, below knee, left (HCC) No date: Anemia No date: Blood transfusion No date: Bronchitis No date: Chest pain No date: CHF (congestive heart failure) (HCC) No date: Chronic kidney disease     Comment:  dialysis, M-W-F, [facility] No date: Diabetes mellitus     Comment:  type 2 No date: Dysrhythmia     Comment:  irregular heartbeat No date: ESRD (end stage renal disease) on dialysis ([place])     Comment:  first HD 06/19/04 No date: GERD (gastroesophageal reflux disease) No date: Hyperparathyroidism ([place]) No date: Morbid obesity ([place])   Reproductive/Obstetrics                             Anesthesia Physical Anesthesia Plan  ASA: IV  Anesthesia Plan: General   Post-op Pain Management:    Induction: Intravenous  PONV Risk Score and Plan: 0 and Ondansetron  Airway Management Planned: LMA  Additional Equipment:   Intra-op Plan:   Post-operative Plan:   Informed Consent: I have reviewed the patients History and Physical, chart, labs and discussed the procedure including the risks, benefits and alternatives for the proposed anesthesia with the patient or authorized representative who has indicated his/her understanding and acceptance.     Dental advisory given  Plan Discussed with: CRNA and Anesthesiologist  Anesthesia Plan Comments:  Anesthesia Quick Evaluation  

## 2018-10-01 NOTE — Anesthesia Post-op Follow-up Note (Signed)
Anesthesia QCDR form completed.        

## 2018-10-01 NOTE — Anesthesia Procedure Notes (Signed)
Procedure Name: LMA Insertion Date/Time: 10/01/2018 2:24 PM Performed by: Dionne Bucy, CRNA Pre-anesthesia Checklist: Patient identified, Patient being monitored, Timeout performed, Emergency Drugs available and Suction available Patient Re-evaluated:Patient Re-evaluated prior to induction Oxygen Delivery Method: Circle system utilized Preoxygenation: Pre-oxygenation with 100% oxygen Induction Type: IV induction Ventilation: Mask ventilation without difficulty LMA: LMA inserted LMA Size: 5.0 Tube type: Oral Number of attempts: 1 Placement Confirmation: positive ETCO2 and breath sounds checked- equal and bilateral Tube secured with: Tape Dental Injury: Teeth and Oropharynx as per pre-operative assessment

## 2018-10-01 NOTE — H&P (Signed)
[facility] History & Physical Update  The patient was interviewed and re-examined.  The patient's previous History and Physical has been reviewed and is unchanged.  There is no change in the plan of care. We plan to proceed with the scheduled procedure.  Leotis Pain, MD  10/01/2018, 12:48 PM

## 2018-10-01 NOTE — Progress Notes (Signed)
Patient c/o itching and 10/10 pain. Notified Dr Lucky Cowboy. Ordered Dilaudid q 3 prn and Benadryl 25mg  q6 prn for itching.

## 2018-10-01 NOTE — Care Management Important Message (Signed)
Important Message  Patient Details  Name: [name] MRN: [id number] Date of Birth: [birth year]   Medicare Important Message Given:  Yes    Dannette Barbara 10/01/2018, 12:28 PM

## 2018-10-02 ENCOUNTER — Other Ambulatory Visit (INDEPENDENT_AMBULATORY_CARE_PROVIDER_SITE_OTHER): Payer: Self-pay | Admitting: Vascular Surgery

## 2018-10-02 ENCOUNTER — Encounter: Payer: Self-pay | Admitting: Vascular Surgery

## 2018-10-02 LAB — VANCOMYCIN, TROUGH: VANCOMYCIN TR: 11 ug/mL — AB (ref 15–20)

## 2018-10-02 LAB — CBC
HCT: 27.9 % — ABNORMAL LOW (ref 39.0–52.0)
Hemoglobin: 8.7 g/dL — ABNORMAL LOW (ref 13.0–17.0)
MCH: 28.5 pg (ref 26.0–34.0)
MCHC: 31.2 g/dL (ref 30.0–36.0)
MCV: 91.5 fL (ref 80.0–100.0)
Platelets: 232 10*3/uL (ref 150–400)
RBC: 3.05 MIL/uL — ABNORMAL LOW (ref 4.22–5.81)
RDW: 16.8 % — ABNORMAL HIGH (ref 11.5–15.5)
WBC: 10.3 10*3/uL (ref 4.0–10.5)
nRBC: 0 % (ref 0.0–0.2)

## 2018-10-02 LAB — TYPE AND SCREEN
ABO/RH(D): A POS
Antibody Screen: NEGATIVE
Unit division: 0

## 2018-10-02 LAB — BASIC METABOLIC PANEL
Anion gap: 24 — ABNORMAL HIGH (ref 5–15)
BUN: 37 mg/dL — ABNORMAL HIGH (ref 6–20)
CHLORIDE: 96 mmol/L — AB (ref 98–111)
CO2: 21 mmol/L — ABNORMAL LOW (ref 22–32)
CREATININE: 7.62 mg/dL — AB (ref 0.61–1.24)
Calcium: 8.5 mg/dL — ABNORMAL LOW (ref 8.9–10.3)
GFR calc Af Amer: 8 mL/min — ABNORMAL LOW (ref >60)
GFR calc non Af Amer: 7 mL/min — ABNORMAL LOW (ref >60)
Glucose, Bld: 159 mg/dL — ABNORMAL HIGH (ref 70–99)
Potassium: 4.4 mmol/L (ref 3.5–5.1)
Sodium: 141 mmol/L (ref 135–145)

## 2018-10-02 LAB — GLUCOSE, CAPILLARY
Glucose-Capillary: 182 mg/dL — ABNORMAL HIGH (ref 70–99)
Glucose-Capillary: 91 mg/dL (ref 70–99)
Glucose-Capillary: 139 mg/dL — ABNORMAL HIGH (ref 70–99)

## 2018-10-02 LAB — BPAM RBC
Blood Product Expiration Date: 202004152359
ISSUE DATE / TIME: 202003191616
UNIT TYPE AND RH: 6200

## 2018-10-02 LAB — PHOSPHORUS: Phosphorus: 4.1 mg/dL (ref 2.5–4.6)

## 2018-10-02 MED ORDER — CEFAZOLIN SODIUM-DEXTROSE 1-4 GM/50ML-% IV SOLN
1.0000 g | INTRAVENOUS | Status: AC
  Administered 2018-10-05: 1 g via INTRAVENOUS
  Filled 2018-10-02 (×2): qty 50

## 2018-10-02 NOTE — Progress Notes (Signed)
[facility]  ROUNDING NOTE   Subjective:  Patient seen and evaluated during hemodialysis today. No thrill could be palpated over his AV graft today. This was intervened upon yesterday. Patient dialyzing via his right femoral dialysis catheter.   Objective:  Vital signs in last 24 hours:  Temp:  [97.5 F (36.4 C)-98.8 F (37.1 C)] 98.8 F (37.1 C) (03/20 1115) Pulse Rate:  [80-103] 81 (03/20 1125) Resp:  [10-23] 20 (03/20 1125) BP: (104-128)/(55-78) 120/70 (03/20 1125) SpO2:  [94 %-100 %] 95 % (03/20 1125) Weight:  [99.5 kg-101.3 kg] 101.3 kg (03/20 1115)  Weight change: -1.8 kg Filed Weights   10/01/18 0552 10/01/18 1334 10/02/18 1115  Weight: 99.5 kg 99.5 kg 101.3 kg    Intake/Output: I/O last 3 completed shifts: In: 1030 [P.O.:240; I.V.:400; Blood:390] Out: 250 [Blood:250]   Intake/Output this shift:  Total I/O In: 120 [P.O.:120] Out: 0   Physical Exam: General: No acute distress  Head: Normocephalic, atraumatic. Moist oral mucosal membranes  Eyes: Anicteric  Neck: Supple, trachea midline  Lungs:  Clear to auscultation, normal effort  Heart: S1S2 no rubs  Abdomen:  Soft, nontender, bowel sounds present  Extremities: Trace peripheral edema, left BKA  Neurologic: Awake, alert, following commands  Skin: Prurigo nodularis  Access: Right femoral dialysis catheter, L femoral AVG    Basic Metabolic Panel: Recent Labs  Lab 09/28/18 1329 09/28/18 1625 09/29/18 0338 09/29/18 0910 09/30/18 0500 09/30/18 1056 10/01/18 0926 10/02/18 0310 10/02/18 1204  NA 138  --  138  --  139  --  141 141  --   K 4.2  --  4.4  --  4.1  --  4.3 4.4  --   CL 96*  --  99  --  98  --  97* 96*  --   CO2 24  --  22  --  28  --  23 21*  --   GLUCOSE 163*  --  146*  --  136*  --  115* 159*  --   BUN 86*  --  90*  --  48*  --  31* 37*  --   CREATININE 12.75* 12.60* 13.81*  --  8.75*  --  6.49* 7.62*  --   CALCIUM 7.0*  --  7.1*  --  8.0*  --  8.8* 8.5*  --   PHOS  --    --   --  7.9*  --  6.1*  --   --  4.1    Liver Function Tests: Recent Labs  Lab 09/28/18 1329  AST 16  ALT 14  ALKPHOS 60  BILITOT 0.5  PROT 7.4  ALBUMIN 3.3*   No results for input(s): LIPASE, AMYLASE in the last 168 hours. No results for input(s): AMMONIA in the last 168 hours.  CBC: Recent Labs  Lab 09/28/18 1329  09/29/18 0338 09/30/18 0500 10/01/18 0926 10/01/18 2242 10/02/18 0310  WBC 10.9*   < > 8.6 7.6 8.1 10.7* 10.3  NEUTROABS 8.6*  --   --   --  5.7  --   --   HGB 8.4*   < > 7.5* 8.0* 7.8* 9.2* 8.7*  HCT 27.4*   < > 24.7* 26.2* 25.1* 29.2* 27.9*  MCV 93.5   < > 93.9 94.2 91.3 90.4 91.5  PLT 269   < > 248 244 232 210 232   < > = values in this interval not displayed.    Cardiac Enzymes: No results for input(s): CKTOTAL,  CKMB, CKMBINDEX, TROPONINI in the last 168 hours.  BNP: Invalid input(s): POCBNP  CBG: Recent Labs  Lab 10/01/18 1337 10/01/18 1659 10/01/18 1739 10/01/18 2112 10/02/18 0736  GLUCAP 83 69* 91 115* 182*    Microbiology: Results for orders placed or performed during the hospital encounter of 09/28/18  Blood culture (routine x 2)     Status: None (Preliminary result)   Collection Time: 09/28/18  2:41 PM  Result Value Ref Range Status   Specimen Description BLOOD RIGHT ANTECUBITAL  Final   Special Requests   Final    BOTTLES DRAWN AEROBIC AND ANAEROBIC Blood Culture results may not be optimal due to an excessive volume of blood received in culture bottles   Culture   Final    NO GROWTH 4 DAYS Performed at [facility], [address] [place], [place] [address]    Report Status PENDING  Incomplete  Blood culture (routine x 2)     Status: None (Preliminary result)   Collection Time: 09/28/18  4:25 PM  Result Value Ref Range Status   Specimen Description BLOOD RIGHT ANTECUBITAL  Final   Special Requests   Final    BOTTLES DRAWN AEROBIC AND ANAEROBIC Blood Culture results may not be optimal due to an inadequate volume  of blood received in culture bottles   Culture   Final    NO GROWTH 4 DAYS Performed at [facility], [address] [place], [place] [address]    Report Status PENDING  Incomplete    Coagulation Studies: Recent Labs    10/01/18 0926  LABPROT 14.9  INR 1.2    Urinalysis: No results for input(s): COLORURINE, LABSPEC, PHURINE, GLUCOSEU, HGBUR, BILIRUBINUR, KETONESUR, PROTEINUR, UROBILINOGEN, NITRITE, LEUKOCYTESUR in the last 72 hours.  Invalid input(s): APPERANCEUR    Imaging: No results found.   Medications:   . sodium chloride Stopped (09/28/18 2333)  . sodium chloride 50 mL/hr at 10/01/18 1347  . sodium thiosulfate infusion for calciphylaxis 25 g (09/30/18 1849)   . Chlorhexidine Gluconate Cloth  6 each Topical Q0600  . epoetin (EPOGEN/PROCRIT) injection  10,000 Units Intravenous Q M,W,F-HD  . epoetin (EPOGEN/PROCRIT) injection  10,000 Units Intravenous Q M,W,F-HD  . ferric citrate  630 mg Oral TID WC  . heparin  5,000 Units Subcutaneous Q8H  . insulin aspart  0-5 Units Subcutaneous QHS  . insulin aspart  0-9 Units Subcutaneous TID WC  . multivitamin  1 tablet Oral QHS  . nicotine  7 mg Transdermal Daily  . pantoprazole  40 mg Oral Daily  . sodium chloride flush  3 mL Intravenous Q12H   sodium chloride, acetaminophen, albuterol, cyclobenzaprine, diphenhydrAMINE, HYDROmorphone (DILAUDID) injection, ondansetron **OR** ondansetron (ZOFRAN) IV, ondansetron (ZOFRAN) IV, oxyCODONE-acetaminophen, polyethylene glycol, sodium chloride flush  Assessment/ Plan:  53 y.o. male with a PMHx of ESRD on HD MWF, anemia chronic kidney disease, secondary hyperparathyroidism, diabetes mellitus type 2, calciphylaxis, left BKA, left thigh graft, who was admitted to [facility] on 09/28/2018 for evaluation of drainage from left groin AV graft.  [name]/MWF  1.  ESRD on HD.    Patient seen and evaluated during hemodialysis.  Tolerating well.  Continue to use right  femoral dialysis catheter for now.  2.  Calciphylaxis.    Continue sodium thiosulfate 25 g IV with dialysis treatments.  3.  Anemia chronic kidney disease.  Continue Epogen 10,000 units IV with dialysis.  4.  Secondary hyperparathyroidism.    Phosphorus down to 4.1.  Maintain the patient on  Auryxia 3 tablets p.o. 3 times daily with meals.  5.  complication dialysis device.  Left femoral AV graft was intervened upon yesterday.  However no thrill today.  Patient will need temporary right femoral dialysis catheter converted to a PermCath in the relative near future.   LOS: 4 [name] 3/20/20201:18 PM

## 2018-10-02 NOTE — Progress Notes (Deleted)
HD Tx Start   10/02/18 1125  Vital Signs  Pulse Rate 81  Resp 20  BP 120/70  Oxygen Therapy  SpO2 95 %  O2 Device Room Air  During Hemodialysis Assessment  Blood Flow Rate (mL/min) 400 mL/min  Arterial Pressure (mmHg) -180 mmHg  Venous Pressure (mmHg) 170 mmHg  Transmembrane Pressure (mmHg) 60 mmHg  Ultrafiltration Rate (mL/min) 1000 mL/min (1000 mL per HOUR removal)  Dialysate Flow Rate (mL/min) 800 ml/min  Conductivity: Machine  14  HD Safety Checks Performed Yes  Dialysis Fluid Bolus Normal Saline  Bolus Amount (mL) 250 mL  Intra-Hemodialysis Comments Tx initiated  Fistula / Graft Left Femoral vein Arteriovenous vein graft  Placement Date/Time: 10/01/18 1450   Placed prior to admission: No  Orientation: Left  Access Location: Femoral vein  Access Type: Arteriovenous vein graft  Site Condition No complications  Fistula / Graft Assessment Present;Thrill;Bruit;Other (Comment) (infected / not in use / weak bruit)  Status  (not in use d/t infection)  Drainage Description  (UTA - wrapped)  Hemodialysis Catheter Right  Placement Date/Time: 11/20/12 1252   Time Out: Correct patient;Correct site;Correct procedure  Maximum sterile barrier precautions: Hand hygiene;Cap;Mask;Sterile gown;Sterile gloves;Large sterile sheet  Site Prep: Chlorhexidine  Local Anesthetic: Injecta  Site Condition No complications  Blue Lumen Status Infusing  Red Lumen Status Infusing  Dressing Type Biopatch  Dressing Status Clean;Dry;Intact;Dressing changed;Antimicrobial disc changed  Interventions New dressing;Dressing changed  Drainage Description None

## 2018-10-02 NOTE — Progress Notes (Signed)
HD Tx Start   10/02/18 1125  Vital Signs  Pulse Rate 81  Resp 20  BP 120/70  Oxygen Therapy  SpO2 95 %  O2 Device Room Air  During Hemodialysis Assessment  Blood Flow Rate (mL/min) 400 mL/min  Arterial Pressure (mmHg) -180 mmHg  Venous Pressure (mmHg) 170 mmHg  Transmembrane Pressure (mmHg) 60 mmHg  Ultrafiltration Rate (mL/min) 1000 mL/min (1000 mL per HOUR removal)  Dialysate Flow Rate (mL/min) 800 ml/min  Conductivity: Machine  14  HD Safety Checks Performed Yes  Dialysis Fluid Bolus Normal Saline  Bolus Amount (mL) 250 mL  Intra-Hemodialysis Comments Tx initiated  Fistula / Graft Left Femoral vein Arteriovenous vein graft  Placement Date/Time: 10/01/18 1450   Placed prior to admission: No  Orientation: Left  Access Location: Femoral vein  Access Type: Arteriovenous vein graft  Site Condition No complications  Fistula / Graft Assessment Absent ;Thrill;Bruit;Other (Comment) (infected / not in use / no bruit)  Status  (not in use d/t infection/ no bruit)  Drainage Description  (UTA - wrapped)  Hemodialysis Catheter Right  Placement Date/Time: 11/20/12 1252   Time Out: Correct patient;Correct site;Correct procedure  Maximum sterile barrier precautions: Hand hygiene;Cap;Mask;Sterile gown;Sterile gloves;Large sterile sheet  Site Prep: Chlorhexidine  Local Anesthetic: Injecta  Site Condition No complications  Blue Lumen Status Infusing  Red Lumen Status Infusing  Dressing Type Biopatch  Dressing Status Clean;Dry;Intact;Dressing changed;Antimicrobial disc changed  Interventions New dressing;Dressing changed  Drainage Description None

## 2018-10-02 NOTE — Progress Notes (Signed)
Pre HD Tx   10/02/18 1115  Hand-Off documentation  Report given to (Full Name) Trellis Paganini RN  Report received from (Full Name) Beverely Low RN  Vital Signs  Temp 98.8 F (37.1 C)  Temp Source Oral  Pulse Rate 80  Pulse Rate Source Monitor  Resp (!) 23  BP 121/70  BP Location Left Arm  BP Method Automatic  Patient Position (if appropriate) Lying  Oxygen Therapy  SpO2 96 %  O2 Device Room Air  Pain Assessment  Pain Scale 0-10  Pain Score 0  Dialysis Weight  Weight 101.3 kg  Type of Weight Pre-Dialysis  Time-Out for Hemodialysis  What Procedure? Hemodialysis  Pt Identifiers(min of two) First/Last Name;MRN/Account#  Correct Site? Yes  Correct Side? Yes  Correct Procedure? Yes  Consents Verified? Yes  Rad Studies Available? N/A  Safety Precautions Reviewed? Yes  Engineer, civil (consulting) Number 4  Station Number 4  UF/Alarm Test Passed  Conductivity: Meter 14  Conductivity: Machine  13.8  pH 7.4  Reverse Osmosis main  Normal Saline Lot Number [id number]  Dialyzer Lot Number [id number]  Disposable Set Lot Number [id number]  Machine Temperature 98.6 F (37 C)  Musician and Audible Yes  Blood Lines Intact and Secured Yes  Pre Treatment Patient Checks  Vascular access used during treatment Catheter  Hepatitis B Surface Antigen Results Negative  Date Hepatitis B Surface Antigen Drawn 09/29/18  Hepatitis B Surface Antibody  (>10)  Date Hepatitis B Surface Antibody Drawn 09/29/18  Hemodialysis Consent Verified Yes  Hemodialysis Standing Orders Initiated Yes  ECG (Telemetry) Monitor On Yes  Prime Ordered Normal Saline  Length of  DialysisTreatment -hour(s) 3.5 Hour(s)  Dialyzer Elisio 17H NR  Dialysate 2K, 2.5 Ca  Dialysate Flow Ordered 800  Blood Flow Rate Ordered 400 mL/min  Ultrafiltration Goal 3 Liters  Pre Treatment Labs Phosphorus  Dialysis Blood Pressure Support Ordered Normal Saline  Education / Care Plan  Dialysis Education Provided Yes  Documented  Education in Care Plan Yes  Hemodialysis Catheter Right  Placement Date/Time: 11/20/12 1252   Time Out: Correct patient;Correct site;Correct procedure  Maximum sterile barrier precautions: Hand hygiene;Cap;Mask;Sterile gown;Sterile gloves;Large sterile sheet  Site Prep: Chlorhexidine  Local Anesthetic: Injecta  Site Condition No complications  Blue Lumen Status Capped (Central line)  Red Lumen Status Capped (Central line)  Dressing Type Gauze/Drain sponge;Occlusive  Dressing Status Old drainage  Interventions New dressing;Dressing changed

## 2018-10-02 NOTE — Progress Notes (Signed)
PT Cancellation Note  Patient Details Name: [name] MRN: [id number] DOB: 12-11-1965   Cancelled Treatment:    Reason Eval/Treat Not Completed: Patient not medically ready(Chart reviewed, RN consulted. Pt wil temporary femoral dialysis catheter in place, OOB with PT prohibited. Will continue to follow acutely and evaluate once pt is appropriate. )  11:35 AM, 10/02/18 Etta Grandchild, PT, DPT Physical Therapist - [facility]  [phone] ([place])     Shizuo Biskup C 10/02/2018, 11:34 AM

## 2018-10-02 NOTE — Progress Notes (Signed)
Pre HD Assessment   10/02/18 1115  Neurological  Level of Consciousness Alert  Orientation Level Oriented X4  Respiratory  Respiratory Pattern Regular  Chest Assessment Chest expansion symmetrical  Bilateral Breath Sounds Clear;Diminished  Cough  (none)  Cardiac  Pulse Regular  ECG Monitor No  Vascular  R Radial Pulse +2  L Radial Pulse +2  Integumentary  Integumentary (WDL) X  Skin Color Appropriate for ethnicity  Skin Condition Dry;Other (Comment)  Skin Integrity Surgical Incision (see LDA)  Musculoskeletal  Musculoskeletal (WDL) X  Generalized Weakness Yes  Gastrointestinal  Bowel Sounds Assessment Active  GU Assessment  Genitourinary (WDL) X  Genitourinary Symptoms Anuria  Psychosocial  Psychosocial (WDL) WDL  Wound/Incision (LDAs)  Type of Wound/Incision (LDA) Wound  Incision (Closed) 10/01/18 Leg Left  Date First Assessed/Time First Assessed: 10/01/18 1534   Location: Leg  Location Orientation: Left  Dressing Type Liquid skin adhesive;Honeycomb  Dressing Clean;Dry;Intact  Drainage Amount None  Wound / Incision (Open or Dehisced) 09/28/18 Other (Comment)  Date First Assessed/Time First Assessed: 09/28/18 1730   Wound Type: (c) Other (Comment)  Dressing Type Honeycomb;Liquid skin adhesive  Dressing Status Clean;Dry;Intact  Site / Wound Assessment Dressing in place / Unable to assess  Drainage Amount Scant

## 2018-10-02 NOTE — Progress Notes (Signed)
Post HD Tx   10/02/18 1504  Vital Signs  Pulse Rate 85  Resp 19  Oxygen Therapy  SpO2 100 %  Post-Hemodialysis Assessment  Rinseback Volume (mL) 250 mL  KECN 78.9 V  Dialyzer Clearance Lightly streaked  Duration of HD Treatment -hour(s) 3.5 hour(s)  Hemodialysis Intake (mL) 500 mL  UF Total -Machine (mL) 4000 mL  Net UF (mL) 3500 mL  Tolerated HD Treatment Yes  Post-Hemodialysis Comments pt stable  Hemodialysis Catheter Right  Placement Date/Time: 11/20/12 1252   Time Out: Correct patient;Correct site;Correct procedure  Maximum sterile barrier precautions: Hand hygiene;Cap;Mask;Sterile gown;Sterile gloves;Large sterile sheet  Site Prep: Chlorhexidine  Local Anesthetic: Injecta  Site Condition No complications  Blue Lumen Status Flushed;Saline locked;Capped (Central line);Heparin locked  Red Lumen Status Flushed;Saline locked;Capped (Central line);Heparin locked  Catheter fill solution Heparin 1000 units/ml  Catheter fill volume (Arterial) 1.7 cc (1.7)  Catheter fill volume (Venous) 1.7  Dressing Type Biopatch  Dressing Status Clean;Dry;Intact;Dressing changed;Antimicrobial disc changed  Interventions New dressing  Drainage Description None  Post treatment catheter status Capped and Clamped

## 2018-10-02 NOTE — Progress Notes (Signed)
HD Tx End   10/02/18 1500  Hand-Off documentation  Report given to (Full Name) Beverely Low RN   Report received from (Full Name) Trellis Paganini RN  Vital Signs  Temp 98.8 F (37.1 C)  Temp Source Oral  Pulse Rate 86  Pulse Rate Source Monitor  Resp 15  BP 121/62  BP Location Left Arm  BP Method Automatic  Patient Position (if appropriate) Lying  Oxygen Therapy  SpO2 100 %  O2 Device Room Air  Pain Assessment  Pain Scale 0-10  Pain Score 0  During Hemodialysis Assessment  Blood Flow Rate (mL/min) 200 mL/min  Arterial Pressure (mmHg) -140 mmHg  Venous Pressure (mmHg) 150 mmHg  Transmembrane Pressure (mmHg) 40 mmHg  Ultrafiltration Rate (mL/min) 1180 mL/min  Dialysate Flow Rate (mL/min) 800 ml/min  Conductivity: Machine  14  HD Safety Checks Performed Yes  KECN 78.9 KECN  Dialysis Fluid Bolus Normal Saline  Bolus Amount (mL) 250 mL  Intra-Hemodialysis Comments Tx completed  Hemodialysis Catheter Right  Placement Date/Time: 11/20/12 1252   Time Out: Correct patient;Correct site;Correct procedure  Maximum sterile barrier precautions: Hand hygiene;Cap;Mask;Sterile gown;Sterile gloves;Large sterile sheet  Site Prep: Chlorhexidine  Local Anesthetic: Injecta  Site Condition No complications

## 2018-10-02 NOTE — Progress Notes (Signed)
[place] [facility] [place] NAME: [name]    MR#:  [id number]  DATE OF BIRTH:  [birth year]  SUBJECTIVE:   Patient doing fine this am He underwent graft revision yesterday.  Patient reports no pain.  REVIEW OF SYSTEMS:    Review of Systems  Constitutional: Negative for fever, chills weight loss HENT: Negative for ear pain, nosebleeds, congestion, facial swelling, rhinorrhea, neck pain, neck stiffness and ear discharge.   Respiratory: Negative for cough, shortness of breath, wheezing  Cardiovascular: Negative for chest pain, palpitations and leg swelling.  Gastrointestinal: Negative for heartburn, abdominal pain, vomiting, diarrhea or consitpation Genitourinary: Negative for dysuria, urgency, frequency, hematuria Musculoskeletal: Negative for back pain or joint pain Neurological: Negative for dizziness, seizures, syncope, focal weakness,  numbness and headaches.  Hematological: Does not bruise/bleed easily.  Psychiatric/Behavioral: Negative for hallucinations, confusion, dysphoric mood SKIN graft ulcer thigh Tolerating Diet: yes     DRUG ALLERGIES:   Allergies  Allergen Reactions  . Hydrocodone Nausea And Vomiting    VITALS:  Blood pressure 127/75, pulse 91, temperature 98.4 F (36.9 C), temperature source Oral, resp. rate 20, height 6\' 1"  (1.854 m), weight 99.5 kg, SpO2 97 %.  PHYSICAL EXAMINATION:  Constitutional: Appears well-developed and well-nourished. No distress. HENT: Normocephalic. Marland Kitchen Oropharynx is clear and moist.  Eyes: Conjunctivae and EOM are normal. PERRLA, no scleral icterus.  Neck: Normal ROM. Neck supple. No JVD. No tracheal deviation. CVS: RRR, S1/S2 +, no murmurs, no gallops, no carotid bruit.  Pulmonary: Effort and breath sounds normal, no stridor, rhonchi, wheezes, rales.  Abdominal: Soft. BS +,  no distension, tenderness, rebound or guarding.  Musculoskeletal: Normal range of motion. + BKA No edema and no  tenderness.  Neuro: Alert. CN 2-12 grossly intact. No focal deficits. Skin: Wounds are covered thighCalciphylaxis Stump clean and intact Psychiatric: Normal mood and affect.  Right femoral catheter    LABORATORY PANEL:   CBC Recent Labs  Lab 10/02/18 0310  WBC 10.3  HGB 8.7*  HCT 27.9*  PLT 232   ------------------------------------------------------------------------------------------------------------------  Chemistries  Recent Labs  Lab 09/28/18 1329  10/02/18 0310  NA 138   < > 141  K 4.2   < > 4.4  CL 96*   < > 96*  CO2 24   < > 21*  GLUCOSE 163*   < > 159*  BUN 86*   < > 37*  CREATININE 12.75*   < > 7.62*  CALCIUM 7.0*   < > 8.5*  AST 16  --   --   ALT 14  --   --   ALKPHOS 60  --   --   BILITOT 0.5  --   --    < > = values in this interval not displayed.   ------------------------------------------------------------------------------------------------------------------  Cardiac Enzymes No results for input(s): TROPONINI in the last 168 hours. ------------------------------------------------------------------------------------------------------------------  RADIOLOGY:  No results found.   ASSESSMENT AND PLAN:   53 year old male with end-stage renal disease on hemodialysis, tobacco dependence and obesity who presented for emergent room from dialysis due to fistula ulceration/infection  1.  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  As per wound care consult:  Silver hydrofiber for exudate management.  crushed Flagyl for odor control,  2.  End-stage renal disease on hemodialysis: Continue hemodialysis via temporary  catheter  3.  Diabetes: Continue sliding scale   4. Tobacco dependence: Patient is encouraged to quit smoking and willing to attempt to quit was assessed. Patient highly motivated.Counseling was provided for 4 minutes. Continue nicotoine patch  5.  Anemia of chronic disease: Continue Epogen  6.  Calciphylaxis: Continue sodium p.o. sulfate  Management plans discussed with the patient and he is in agreement.  CODE STATUS: full  TOTAL TIME TAKING CARE OF THIS PATIENT: 25  minutes.   D/w dr dew this am  POSSIBLE D/C 2-4 days, DEPENDING ON CLINICAL CONDITION.   Bettey Costa M.D on 10/02/2018 at 10:01 AM  Between 7am to 6pm - Pager - [phone] After 6pm go to [email or web address] - password [email or web address] [facility]  Office  [phone]  CC: Primary care physician; Sandi Mariscal, MD  Note: This dictation was prepared with Dragon dictation along with smaller phrase technology. Any transcriptional errors that result from this process are unintentional.

## 2018-10-02 NOTE — Progress Notes (Signed)
[facility] Daily Progress Note   Subjective: 1 Day Post-Op:  1. Jump graft revision of left thigh Artegraft AV graft with 7 mm Artegraft including Fogarty embolectomy for thrombectomy both the arterial and venous portions of the old graft  2. Ligation and resection of the midportion of the old AV graft and chronic thigh wound  2 Day Post-Op:  1.  Left thigh arteriovenous graft cannulation under ultrasound guidance x 2 2.  Left leg shuntogram 3.  Catheter directed thrombolytic therapy to the left thigh AV graft using 14 mg of TPA instilled in the graft  3 Day Post-Op:  1. Insertion of temporary dialysis catheter catheter right femoral approach.  Patient with some left thigh pain this AM. Temp cath dialysis catheter functioning well.   Objective: Vitals:   10/01/18 1954 10/02/18 0514 10/02/18 1115 10/02/18 1125  BP: 119/66 127/75 121/70 120/70  Pulse: 96 91 80 81  Resp: 14 20 (!) 23 20  Temp: (!) 97.5 F (36.4 C) 98.4 F (36.9 C) 98.8 F (37.1 C)   TempSrc: Oral Oral Oral   SpO2: 100% 97% 96% 95%  Weight:   101.3 kg   Height:        Intake/Output Summary (Last 24 hours) at 10/02/2018 1324 Last data filed at 10/02/2018 1002 Gross per 24 hour  Intake 1150 ml  Output 250 ml  Net 900 ml   Physical Exam: A&Ox3, NAD CV: RRR Pulmonary: CTA Bilaterally Abdomen: Soft, Nontender, Nondistended, (+) Bowel Sounds Right Groin: Temp Catheter in place. No signs of infection. Vascular:  Left Lower Extremity: OR dressing intact, clean an dry. Arterial / Venous incisions with dermabond intact. No swelling or drainage noted. No bruit or thrill noted.    Laboratory: CBC    Component Value Date/Time   WBC 10.3 10/02/2018 0310   HGB 8.7 (L) 10/02/2018 0310   HGB 11.4 (L) 02/10/2014 1319   HCT 27.9 (L) 10/02/2018 0310   HCT 36.3 (L) 02/10/2014 1319   PLT 232 10/02/2018 0310   PLT 140 (L) 02/10/2014 1319   BMET    Component Value Date/Time   NA 141  10/02/2018 0310   NA 135 (L) 02/10/2014 1319   K 4.4 10/02/2018 0310   K 4.6 02/17/2014 1353   CL 96 (L) 10/02/2018 0310   CL 99 02/10/2014 1319   CO2 21 (L) 10/02/2018 0310   CO2 25 02/10/2014 1319   GLUCOSE 159 (H) 10/02/2018 0310   GLUCOSE 205 (H) 02/10/2014 1319   BUN 37 (H) 10/02/2018 0310   BUN 40 (H) 02/10/2014 1319   CREATININE 7.62 (H) 10/02/2018 0310   CREATININE 11.58 (H) 02/10/2014 1319   CALCIUM 8.5 (L) 10/02/2018 0310   CALCIUM 8.4 (L) 02/10/2014 1319   GFRNONAA 7 (L) 10/02/2018 0310   GFRNONAA 5 (L) 02/10/2014 1319   GFRAA 8 (L) 10/02/2018 0310   GFRAA 5 (L) 02/10/2014 1319   Assessment/Planning: The patient is a 53 year old male with ESRD s/p thigh graft revision 1) No thrill or bruit noted in graft. Might be able to perform shuntogram after anastomosis sites have healed. At this time, will plan on Permcath insertion Monday. 2) Continue to use temp cath for now 3) Will pre-op  Discussed with Dr. Ellis Parents Sabrine Patchen PA-C 10/02/2018 1:24 PM

## 2018-10-03 LAB — GLUCOSE, CAPILLARY
Glucose-Capillary: 134 mg/dL — ABNORMAL HIGH (ref 70–99)
Glucose-Capillary: 158 mg/dL — ABNORMAL HIGH (ref 70–99)
Glucose-Capillary: 147 mg/dL — ABNORMAL HIGH (ref 70–99)

## 2018-10-03 LAB — CULTURE, BLOOD (ROUTINE X 2)
Culture: NO GROWTH
Culture: NO GROWTH

## 2018-10-03 LAB — BASIC METABOLIC PANEL
ANION GAP: 15 (ref 5–15)
BUN: 26 mg/dL — ABNORMAL HIGH (ref 6–20)
CO2: 26 mmol/L (ref 22–32)
Calcium: 8.7 mg/dL — ABNORMAL LOW (ref 8.9–10.3)
Chloride: 98 mmol/L (ref 98–111)
Creatinine, Ser: 6.42 mg/dL — ABNORMAL HIGH (ref 0.61–1.24)
GFR calc Af Amer: 10 mL/min — ABNORMAL LOW (ref >60)
GFR calc non Af Amer: 9 mL/min — ABNORMAL LOW (ref >60)
Glucose, Bld: 184 mg/dL — ABNORMAL HIGH (ref 70–99)
Potassium: 3.8 mmol/L (ref 3.5–5.1)
Sodium: 139 mmol/L (ref 135–145)

## 2018-10-03 LAB — MRSA PCR SCREENING: MRSA BY PCR: NEGATIVE

## 2018-10-03 NOTE — Progress Notes (Signed)
PT Cancellation Note  Patient Details Name: [name] MRN: [id number] DOB: 01/16/1966   Cancelled Treatment:    Reason Eval/Treat Not Completed: Patient not medically ready due to having temporary femoral dialysis cath.    [address], [place], [place] DPT 10/03/2018, 2:13 PM

## 2018-10-03 NOTE — Progress Notes (Signed)
[facility]  ROUNDING NOTE   Subjective:  Patient seen at bedside. He underwent hemodialysis yesterday. Plan for permacath on Monday.   Objective:  Vital signs in last 24 hours:  Temp:  [97.6 F (36.4 C)-99 F (37.2 C)] 99 F (37.2 C) (03/21 0443) Pulse Rate:  [69-97] 91 (03/21 0443) Resp:  [11-26] 20 (03/21 0443) BP: (104-132)/(48-84) 114/67 (03/21 0443) SpO2:  [89 %-100 %] 92 % (03/21 0529) Weight:  [99.2 kg] 99.2 kg (03/21 0443)  Weight change: 1.8 kg Filed Weights   10/01/18 1334 10/02/18 1115 10/03/18 0443  Weight: 99.5 kg 101.3 kg 99.2 kg    Intake/Output: I/O last 3 completed shifts: In: 856.3 [P.O.:420; IV Piggyback:436.3] Out: 3500 [Other:3500]   Intake/Output this shift:  Total I/O In: 236 [P.O.:236] Out: 0   Physical Exam: General: No acute distress  Head: Normocephalic, atraumatic. Moist oral mucosal membranes  Eyes: Anicteric  Neck: Supple, trachea midline  Lungs:  Clear to auscultation, normal effort  Heart: S1S2 no rubs  Abdomen:  Soft, nontender, bowel sounds present  Extremities: Trace peripheral edema, left BKA  Neurologic: Awake, alert, following commands  Skin: Prurigo nodularis  Access: Right femoral dialysis catheter, L femoral AVG no bruit    Basic Metabolic Panel: Recent Labs  Lab 09/29/18 0338 09/29/18 0910 09/30/18 0500 09/30/18 1056 10/01/18 0926 10/02/18 0310 10/02/18 1204 10/03/18 0444  NA 138  --  139  --  141 141  --  139  K 4.4  --  4.1  --  4.3 4.4  --  3.8  CL 99  --  98  --  97* 96*  --  98  CO2 22  --  28  --  23 21*  --  26  GLUCOSE 146*  --  136*  --  115* 159*  --  184*  BUN 90*  --  48*  --  31* 37*  --  26*  CREATININE 13.81*  --  8.75*  --  6.49* 7.62*  --  6.42*  CALCIUM 7.1*  --  8.0*  --  8.8* 8.5*  --  8.7*  PHOS  --  7.9*  --  6.1*  --   --  4.1  --     Liver Function Tests: Recent Labs  Lab 09/28/18 1329  AST 16  ALT 14  ALKPHOS 60  BILITOT 0.5  PROT 7.4  ALBUMIN 3.3*   No  results for input(s): LIPASE, AMYLASE in the last 168 hours. No results for input(s): AMMONIA in the last 168 hours.  CBC: Recent Labs  Lab 09/28/18 1329  09/29/18 0338 09/30/18 0500 10/01/18 0926 10/01/18 2242 10/02/18 0310  WBC 10.9*   < > 8.6 7.6 8.1 10.7* 10.3  NEUTROABS 8.6*  --   --   --  5.7  --   --   HGB 8.4*   < > 7.5* 8.0* 7.8* 9.2* 8.7*  HCT 27.4*   < > 24.7* 26.2* 25.1* 29.2* 27.9*  MCV 93.5   < > 93.9 94.2 91.3 90.4 91.5  PLT 269   < > 248 244 232 210 232   < > = values in this interval not displayed.    Cardiac Enzymes: No results for input(s): CKTOTAL, CKMB, CKMBINDEX, TROPONINI in the last 168 hours.  BNP: Invalid input(s): POCBNP  CBG: Recent Labs  Lab 10/01/18 2112 10/02/18 0736 10/02/18 1536 10/02/18 2115 10/03/18 0738  GLUCAP 115* 182* 91 139* 134*    Microbiology: Results for orders  placed or performed during the hospital encounter of 09/28/18  Blood culture (routine x 2)     Status: None   Collection Time: 09/28/18  2:41 PM  Result Value Ref Range Status   Specimen Description BLOOD RIGHT ANTECUBITAL  Final   Special Requests   Final    BOTTLES DRAWN AEROBIC AND ANAEROBIC Blood Culture results may not be optimal due to an excessive volume of blood received in culture bottles   Culture   Final    NO GROWTH 5 DAYS Performed at [facility], [address] [place], [place] [address]    Report Status 10/03/2018 FINAL  Final  Blood culture (routine x 2)     Status: None   Collection Time: 09/28/18  4:25 PM  Result Value Ref Range Status   Specimen Description BLOOD RIGHT ANTECUBITAL  Final   Special Requests   Final    BOTTLES DRAWN AEROBIC AND ANAEROBIC Blood Culture results may not be optimal due to an inadequate volume of blood received in culture bottles   Culture   Final    NO GROWTH 5 DAYS Performed at [facility], [address] [place], [place] [address]    Report Status 10/03/2018 FINAL  Final  MRSA PCR  Screening     Status: None   Collection Time: 10/02/18 10:39 PM  Result Value Ref Range Status   MRSA by PCR NEGATIVE NEGATIVE Final    Comment:        The GeneXpert MRSA Assay (FDA approved for NASAL specimens only), is one component of a comprehensive MRSA colonization surveillance program. It is not intended to diagnose MRSA infection nor to guide or monitor treatment for MRSA infections. Performed at [facility], [address] [place], [place] [address]     Coagulation Studies: Recent Labs    10/01/18 0926  LABPROT 14.9  INR 1.2    Urinalysis: No results for input(s): COLORURINE, LABSPEC, PHURINE, GLUCOSEU, HGBUR, BILIRUBINUR, KETONESUR, PROTEINUR, UROBILINOGEN, NITRITE, LEUKOCYTESUR in the last 72 hours.  Invalid input(s): APPERANCEUR    Imaging: No results found.   Medications:   . sodium chloride Stopped (09/28/18 2333)  . [START ON 10/05/2018]  ceFAZolin (ANCEF) IV    . sodium thiosulfate infusion for calciphylaxis Stopped (10/02/18 1710)   . Chlorhexidine Gluconate Cloth  6 each Topical Q0600  . epoetin (EPOGEN/PROCRIT) injection  10,000 Units Intravenous Q M,W,F-HD  . epoetin (EPOGEN/PROCRIT) injection  10,000 Units Intravenous Q M,W,F-HD  . ferric citrate  630 mg Oral TID WC  . heparin  5,000 Units Subcutaneous Q8H  . insulin aspart  0-5 Units Subcutaneous QHS  . insulin aspart  0-9 Units Subcutaneous TID WC  . multivitamin  1 tablet Oral QHS  . nicotine  7 mg Transdermal Daily  . pantoprazole  40 mg Oral Daily  . sodium chloride flush  3 mL Intravenous Q12H   sodium chloride, acetaminophen, albuterol, cyclobenzaprine, diphenhydrAMINE, HYDROmorphone (DILAUDID) injection, ondansetron **OR** ondansetron (ZOFRAN) IV, ondansetron (ZOFRAN) IV, oxyCODONE-acetaminophen, polyethylene glycol, sodium chloride flush  Assessment/ Plan:  53 y.o. male with a PMHx of ESRD on HD MWF, anemia chronic kidney disease, secondary hyperparathyroidism, diabetes  mellitus type 2, calciphylaxis, left BKA, left thigh graft, who was admitted to [facility] on 09/28/2018 for evaluation of drainage from left groin AV graft.  [facility]/MWF  1.  ESRD on HD.    Patient completed dialysis yesterday.  No acute indication for dialysis today.  2.  Calciphylaxis.    Continue  sodium thiosulfate 25 g IV with dialysis treatments.  3.  Anemia chronic kidney disease.  Hemoglobin 8.7 at last check.  Maintain the patient on Epogen 10,000 IV with dialysis.  4.  Secondary hyperparathyroidism.    Phosphorus at last check was 4.1.  We will continue Auryxia 3 tablets p.o. 3 times daily with meals.  5.  complication dialysis device.  Left femoral AV graft was intervened upon this admission.  No thrill or bruit now.  Patient will need temporary right femoral dialysis catheter converted to a PermCath in the relative near future.   LOS: 5 [place] Bills 3/21/202012:03 PM

## 2018-10-03 NOTE — Progress Notes (Signed)
[facility] NAME: [name]    MR#:  [id number]  DATE OF BIRTH:  1966/05/28  SUBJECTIVE:   The patient has no complaints. REVIEW OF SYSTEMS:    Review of Systems  Constitutional: Negative for fever, chills weight loss HENT: Negative for ear pain, nosebleeds, congestion, facial swelling, rhinorrhea, neck pain, neck stiffness and ear discharge.   Respiratory: Negative for cough, shortness of breath, wheezing  Cardiovascular: Negative for chest pain, palpitations and leg swelling.  Gastrointestinal: Negative for heartburn, abdominal pain, vomiting, diarrhea or consitpation Genitourinary: Negative for dysuria, urgency, frequency, hematuria Musculoskeletal: Negative for back pain or joint pain Neurological: Negative for dizziness, seizures, syncope, focal weakness,  numbness and headaches.  Hematological: Does not bruise/bleed easily.  Psychiatric/Behavioral: Negative for hallucinations, confusion, dysphoric mood SKIN graft ulcer thigh DRUG ALLERGIES:   Allergies  Allergen Reactions  . Hydrocodone Nausea And Vomiting    VITALS:  Blood pressure 114/67, pulse 91, temperature 99 F (37.2 C), temperature source Oral, resp. rate 20, height 6\' 1"  (1.854 m), weight 99.2 kg, SpO2 92 %.  PHYSICAL EXAMINATION:  Constitutional: Appears well-developed and well-nourished. No distress. HENT: Normocephalic. Marland Kitchen Oropharynx is clear and moist.  Eyes: Conjunctivae and EOM are normal. PERRLA, no scleral icterus.  Neck: Normal ROM. Neck supple. No JVD. No tracheal deviation. CVS: RRR, S1/S2 +, no murmurs, no gallops, no carotid bruit.  Pulmonary: Effort and breath sounds normal, no stridor, rhonchi, wheezes, rales.  Abdominal: Soft. BS +,  no distension, tenderness, rebound or guarding.  Musculoskeletal: Normal range of motion. + BKA No edema and no tenderness.  Neuro: Alert. CN 2-12 grossly intact. No focal deficits. Skin: Wounds are covered  thighCalciphylaxis Stump clean and intact Psychiatric: Normal mood and affect.  Right femoral catheter LABORATORY PANEL:   CBC Recent Labs  Lab 10/02/18 0310  WBC 10.3  HGB 8.7*  HCT 27.9*  PLT 232   ------------------------------------------------------------------------------------------------------------------  Chemistries  Recent Labs  Lab 09/28/18 1329  10/03/18 0444  NA 138   < > 139  K 4.2   < > 3.8  CL 96*   < > 98  CO2 24   < > 26  GLUCOSE 163*   < > 184*  BUN 86*   < > 26*  CREATININE 12.75*   < > 6.42*  CALCIUM 7.0*   < > 8.7*  AST 16  --   --   ALT 14  --   --   ALKPHOS 60  --   --   BILITOT 0.5  --   --    < > = values in this interval not displayed.   ------------------------------------------------------------------------------------------------------------------  Cardiac Enzymes No results for input(s): TROPONINI in the last 168 hours. ------------------------------------------------------------------------------------------------------------------  RADIOLOGY:  No results found.   ASSESSMENT AND PLAN:   53 year old male with end-stage renal disease on hemodialysis, tobacco dependence and obesity who presented for emergent room from dialysis due to fistula ulceration/infection  1.  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  Antibiotics was discontinued.  As per wound care consult:  Silver hydrofiber for exudate management.  crushed Flagyl for odor control.  2.  End-stage renal disease on hemodialysis: Continue hemodialysis via temporary catheter  3.  Diabetes: Continue sliding scale  4. Tobacco dependence: Patient is encouraged to  quit smoking and willing to attempt to quit was assessed. Patient highly motivated.Counseling was provided for 4 minutes. Continue nicotoine patch  5.  Anemia of chronic disease: Continue Epogen  6.  Calciphylaxis: Continue sodium p.o. sulfate  Management plans discussed with the patient and he is in agreement.  CODE STATUS: full  TOTAL TIME TAKING CARE OF THIS PATIENT: 25  minutes.   D/w dr dew this am  POSSIBLE D/C 3 days, DEPENDING ON CLINICAL CONDITION.   Demetrios Loll M.D on 10/03/2018 at 12:21 PM  Between 7am to 6pm - Pager - [phone] After 6pm go to [email or web address] - password [email or web address] [facility]  Office  [phone]  CC: Primary care physician; Sandi Mariscal, MD  Note: This dictation was prepared with Dragon dictation along with smaller phrase technology. Any transcriptional errors that result from this process are unintentional.

## 2018-10-04 LAB — GLUCOSE, CAPILLARY
GLUCOSE-CAPILLARY: 191 mg/dL — AB (ref 70–99)
Glucose-Capillary: 160 mg/dL — ABNORMAL HIGH (ref 70–99)
Glucose-Capillary: 211 mg/dL — ABNORMAL HIGH (ref 70–99)
Glucose-Capillary: 137 mg/dL — ABNORMAL HIGH (ref 70–99)

## 2018-10-04 MED ORDER — ALUM & MAG HYDROXIDE-SIMETH 200-200-20 MG/5ML PO SUSP
30.0000 mL | Freq: Four times a day (QID) | ORAL | Status: DC | PRN
  Administered 2018-10-04: 30 mL via ORAL
  Filled 2018-10-04: qty 30

## 2018-10-04 NOTE — Progress Notes (Signed)
[facility]  ROUNDING NOTE   Subjective:  Patient seen at bedside. Due for dialysis again tomorrow. Resting comfortably in bed.   Objective:  Vital signs in last 24 hours:  Temp:  [98 F (36.7 C)-98.6 F (37 C)] 98 F (36.7 C) (03/22 1131) Pulse Rate:  [80-95] 80 (03/22 1131) Resp:  [18-20] 18 (03/22 1131) BP: (118-125)/(57-69) 118/69 (03/22 1131) SpO2:  [95 %-100 %] 100 % (03/22 1131) Weight:  [100.6 kg] 100.6 kg (03/22 0300)  Weight change: -0.7 kg Filed Weights   10/02/18 1115 10/03/18 0443 10/04/18 0300  Weight: 101.3 kg 99.2 kg 100.6 kg    Intake/Output: I/O last 3 completed shifts: In: 496 [P.O.:496] Out: 0    Intake/Output this shift:  No intake/output data recorded.  Physical Exam: General: No acute distress  Head: Normocephalic, atraumatic. Moist oral mucosal membranes  Eyes: Anicteric  Neck: Supple, trachea midline  Lungs:  Clear to auscultation, normal effort  Heart: S1S2 no rubs  Abdomen:  Soft, nontender, bowel sounds present  Extremities: Trace peripheral edema, left BKA  Neurologic: Awake, alert, following commands  Skin: Prurigo nodularis  Access: Right femoral dialysis catheter, L femoral AVG no bruit    Basic Metabolic Panel: Recent Labs  Lab 09/29/18 0338 09/29/18 0910 09/30/18 0500 09/30/18 1056 10/01/18 0926 10/02/18 0310 10/02/18 1204 10/03/18 0444  NA 138  --  139  --  141 141  --  139  K 4.4  --  4.1  --  4.3 4.4  --  3.8  CL 99  --  98  --  97* 96*  --  98  CO2 22  --  28  --  23 21*  --  26  GLUCOSE 146*  --  136*  --  115* 159*  --  184*  BUN 90*  --  48*  --  31* 37*  --  26*  CREATININE 13.81*  --  8.75*  --  6.49* 7.62*  --  6.42*  CALCIUM 7.1*  --  8.0*  --  8.8* 8.5*  --  8.7*  PHOS  --  7.9*  --  6.1*  --   --  4.1  --     Liver Function Tests: Recent Labs  Lab 09/28/18 1329  AST 16  ALT 14  ALKPHOS 60  BILITOT 0.5  PROT 7.4  ALBUMIN 3.3*   No results for input(s): LIPASE, AMYLASE in the  last 168 hours. No results for input(s): AMMONIA in the last 168 hours.  CBC: Recent Labs  Lab 09/28/18 1329  09/29/18 0338 09/30/18 0500 10/01/18 0926 10/01/18 2242 10/02/18 0310  WBC 10.9*   < > 8.6 7.6 8.1 10.7* 10.3  NEUTROABS 8.6*  --   --   --  5.7  --   --   HGB 8.4*   < > 7.5* 8.0* 7.8* 9.2* 8.7*  HCT 27.4*   < > 24.7* 26.2* 25.1* 29.2* 27.9*  MCV 93.5   < > 93.9 94.2 91.3 90.4 91.5  PLT 269   < > 248 244 232 210 232   < > = values in this interval not displayed.    Cardiac Enzymes: No results for input(s): CKTOTAL, CKMB, CKMBINDEX, TROPONINI in the last 168 hours.  BNP: Invalid input(s): POCBNP  CBG: Recent Labs  Lab 10/03/18 0738 10/03/18 1637 10/03/18 2109 10/04/18 0734 10/04/18 1132  GLUCAP 134* 158* 147* 160* 21*    Microbiology: Results for orders placed or performed during the hospital  encounter of 09/28/18  Blood culture (routine x 2)     Status: None   Collection Time: 09/28/18  2:41 PM  Result Value Ref Range Status   Specimen Description BLOOD RIGHT ANTECUBITAL  Final   Special Requests   Final    BOTTLES DRAWN AEROBIC AND ANAEROBIC Blood Culture results may not be optimal due to an excessive volume of blood received in culture bottles   Culture   Final    NO GROWTH 5 DAYS Performed at [facility], [address] [place], [place] [address]    Report Status 10/03/2018 FINAL  Final  Blood culture (routine x 2)     Status: None   Collection Time: 09/28/18  4:25 PM  Result Value Ref Range Status   Specimen Description BLOOD RIGHT ANTECUBITAL  Final   Special Requests   Final    BOTTLES DRAWN AEROBIC AND ANAEROBIC Blood Culture results may not be optimal due to an inadequate volume of blood received in culture bottles   Culture   Final    NO GROWTH 5 DAYS Performed at [facility], [address] [place], [place] [address]    Report Status 10/03/2018 FINAL  Final  MRSA PCR Screening     Status: None   Collection  Time: 10/02/18 10:39 PM  Result Value Ref Range Status   MRSA by PCR NEGATIVE NEGATIVE Final    Comment:        The GeneXpert MRSA Assay (FDA approved for NASAL specimens only), is one component of a comprehensive MRSA colonization surveillance program. It is not intended to diagnose MRSA infection nor to guide or monitor treatment for MRSA infections. Performed at [facility], [address] [place], [place] [address]     Coagulation Studies: No results for input(s): LABPROT, INR in the last 72 hours.  Urinalysis: No results for input(s): COLORURINE, LABSPEC, PHURINE, GLUCOSEU, HGBUR, BILIRUBINUR, KETONESUR, PROTEINUR, UROBILINOGEN, NITRITE, LEUKOCYTESUR in the last 72 hours.  Invalid input(s): APPERANCEUR    Imaging: No results found.   Medications:   . sodium chloride Stopped (09/28/18 2333)  . [START ON 10/05/2018]  ceFAZolin (ANCEF) IV    . sodium thiosulfate infusion for calciphylaxis Stopped (10/02/18 1710)   . Chlorhexidine Gluconate Cloth  6 each Topical Q0600  . epoetin (EPOGEN/PROCRIT) injection  10,000 Units Intravenous Q M,W,F-HD  . epoetin (EPOGEN/PROCRIT) injection  10,000 Units Intravenous Q M,W,F-HD  . ferric citrate  630 mg Oral TID WC  . heparin  5,000 Units Subcutaneous Q8H  . insulin aspart  0-5 Units Subcutaneous QHS  . insulin aspart  0-9 Units Subcutaneous TID WC  . multivitamin  1 tablet Oral QHS  . nicotine  7 mg Transdermal Daily  . pantoprazole  40 mg Oral Daily  . sodium chloride flush  3 mL Intravenous Q12H   sodium chloride, acetaminophen, albuterol, alum & mag hydroxide-simeth, cyclobenzaprine, diphenhydrAMINE, HYDROmorphone (DILAUDID) injection, ondansetron **OR** ondansetron (ZOFRAN) IV, ondansetron (ZOFRAN) IV, oxyCODONE-acetaminophen, polyethylene glycol, sodium chloride flush  Assessment/ Plan:  53 y.o. male with a PMHx of ESRD on HD MWF, anemia chronic kidney disease, secondary hyperparathyroidism, diabetes mellitus  type 2, calciphylaxis, left BKA, left thigh graft, who was admitted to [facility] on 09/28/2018 for evaluation of drainage from left groin AV graft.  [facility]/MWF  1.  ESRD on HD.    Patient due for dialysis tomorrow.  We will prepare orders for tomorrow.  2.  Calciphylaxis.    Maintain the patient on sodium thiosulfate  25 g IV with dialysis treatments.  3.  Anemia chronic kidney disease.  Continue Epogen 10,000 units IV with dialysis.  Most recent hemoglobin was 8.7.  4.  Secondary hyperparathyroidism.    Phosphorus now at target at 4.1.  Continue Auryxia 3 tablets p.o. 3 times daily with meals.  5.  complication dialysis device.  Left femoral AV graft was intervened upon this admission.  No thrill or bruit now.  Patient will need temporary right femoral dialysis catheter converted to a PermCath in the relative near future, hopefully tomorrow.    LOS: 6 [name] 3/22/20201:16 PM

## 2018-10-04 NOTE — Evaluation (Signed)
Physical Therapy Evaluation Patient Details Name: [name] MRN: [id number] DOB: September 03, 1965 Today's Date: 10/04/2018   History of Present Illness  PMHx of ESRD on HD MWF, anemia chronic kidney disease, secondary hyperparathyroidism, diabetes mellitus type 2, calciphylaxis, left BKA, left thigh graft, who was admitted to [facility] on 09/28/2018 for evaluation of drainage fromleft groin AV graft. Very resistant to work with PT, due to being tired. PT able to obtain history and complete minimal physical evaluation.   Clinical Impression  Patient is very resistant to work with PT. PT explained to patient that PT will need to work with him with his prosthetic to ensure safety with return to function. Patient agreeable to walk to door and back without prosthetic with RW and hop to pattern. Patient is modI in bed mobility, transfers and ambulation as he uses AD and needs increased time for safety. Patient may be more independent to return home without PT services with prosthetic, but until this is evaluated, it is this PTs recommendation that he have [facility] services to address safety with RW and without prosthetic. Would benefit from skilled PT to address above deficits and promote optimal return to PLOF     Follow Up Recommendations Home health PT    Equipment Recommendations  Rolling walker with 5" wheels    Recommendations for Other Services       Precautions / Restrictions Precautions Precautions: Fall Restrictions Weight Bearing Restrictions: No      Mobility  Bed Mobility Overal bed mobility: Modified Independent             General bed mobility comments: Increased time needed, use of bedrails  Transfers Overall transfer level: Modified independent Equipment used: Rolling walker (2 wheeled)             General transfer comment: Increased time and assistance with RW needed. Patient reports he could complete without AD if he donned prosthetic, but "does not want to do  so"  Ambulation/Gait Ambulation/Gait assistance: Modified independent (Device/Increase time) Gait Distance (Feet): 15 Feet Assistive device: Rolling walker (2 wheeled) Gait Pattern/deviations: (Hop on RLE) Gait velocity: decreased; reports no fatigue   General Gait Details: Patient with hop gait pattern with RW; will need to assess with prosthetic in the future when patient is compliant   Stairs            Wheelchair Mobility    Modified Rankin (Stroke Patients Only)       Balance Overall balance assessment: Independent                                           Pertinent Vitals/Pain Pain Assessment: 0-10 Pain Score: 10-Worst pain ever Pain Location: LLE reports 10/10 pain with no physical signs of distress Pain Descriptors / Indicators: Stabbing Pain Intervention(s): Monitored during session;Limited activity within patient's tolerance    Home Living Family/patient expects to be discharged to:: Private residence Living Arrangements: Children Available Help at Discharge: Family Type of Home: House Home Access: Level entry     Home Layout: One level Home Equipment: Environmental consultant - 2 wheels;Crutches      Prior Function Level of Independence: Independent         Comments: L BKA w/ prosthetic use over 3 years w/o AD     Hand Dominance   Dominant Hand: Right    Extremity/Trunk Assessment   Upper Extremity Assessment Upper Extremity  Assessment: Overall WFL for tasks assessed    Lower Extremity Assessment Lower Extremity Assessment: Overall WFL for tasks assessed    Cervical / Trunk Assessment Cervical / Trunk Assessment: Normal  Communication   Communication: No difficulties  Cognition Arousal/Alertness: Awake/alert Behavior During Therapy: WFL for tasks assessed/performed Overall Cognitive Status: Within Functional Limits for tasks assessed                                 General Comments: Tired but alert       General Comments      Exercises Other Exercises Other Exercises: PT educated patient on purpose of PT and mobility to enhance blood flow and circulation and PT's role in health care continum. Encouraged PT to continue ambulating with hop-to + RW with nurse to and from bathroom as able. PT educated patient on need for PT to observe and ensure safety with ambulation with prosthetic prior to DC. PT nods in agreeance.    Assessment/Plan    PT Assessment Patient needs continued PT services  PT Problem List Decreased coordination;Decreased mobility;Decreased activity tolerance       PT Treatment Interventions Therapeutic exercise;Gait training;Balance training;Neuromuscular re-education;Functional mobility training;Therapeutic activities;Patient/family education    PT Goals (Current goals can be found in the Care Plan section)  Acute Rehab PT Goals Patient Stated Goal: decrease pain PT Goal Formulation: With patient Time For Goal Achievement: 10/18/18 Potential to Achieve Goals: Fair    Frequency Min 2X/week   Barriers to discharge Decreased caregiver support      Co-evaluation               AM-PAC PT "6 Clicks" Mobility  Outcome Measure Help needed turning from your back to your side while in a flat bed without using bedrails?: None Help needed moving from lying on your back to sitting on the side of a flat bed without using bedrails?: A Little Help needed moving to and from a bed to a chair (including a wheelchair)?: A Little Help needed standing up from a chair using your arms (e.g., wheelchair or bedside chair)?: A Little Help needed to walk in hospital room?: A Little Help needed climbing 3-5 steps with a railing? : A Lot 6 Click Score: 18    End of Session Equipment Utilized During Treatment: Gait belt Activity Tolerance: Patient limited by lethargy;Other (comment)(decreased willingness to participate) Patient left: in bed;with call bell/phone within reach;with bed  alarm set Nurse Communication: Mobility status PT Visit Diagnosis: Other abnormalities of gait and mobility (R26.89);Difficulty in walking, not elsewhere classified (R26.2)    Time: 3329-5188 PT Time Calculation (min) (ACUTE ONLY): 12 min   Charges:   PT Evaluation $PT Eval Low Complexity: 1 Low PT Treatments $Therapeutic Activity: 8-22 mins       Shelton Silvas PT, DPT  Shelton Silvas 10/04/2018, 2:53 PM
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
## 2018-10-05 ENCOUNTER — Encounter: Payer: Self-pay | Admitting: Anesthesiology

## 2018-10-05 ENCOUNTER — Other Ambulatory Visit (INDEPENDENT_AMBULATORY_CARE_PROVIDER_SITE_OTHER): Payer: Self-pay | Admitting: Vascular Surgery

## 2018-10-05 ENCOUNTER — Encounter: Payer: Self-pay | Admitting: *Deleted

## 2018-10-05 ENCOUNTER — Encounter
Admission: EM | Disposition: A | Discharge status: Home Health | Payer: Self-pay | Source: Home / Self Care | Attending: Internal Medicine

## 2018-10-05 DIAGNOSIS — T82898A Other specified complication of vascular prosthetic devices, implants and grafts, initial encounter: Secondary | ICD-10-CM

## 2018-10-05 DIAGNOSIS — N186 End stage renal disease: Secondary | ICD-10-CM

## 2018-10-05 DIAGNOSIS — Z992 Dependence on renal dialysis: Secondary | ICD-10-CM

## 2018-10-05 HISTORY — PX: DIALYSIS/PERMA CATHETER INSERTION: CATH118288

## 2018-10-05 LAB — GLUCOSE, CAPILLARY
Glucose-Capillary: 173 mg/dL — ABNORMAL HIGH (ref 70–99)
Glucose-Capillary: 138 mg/dL — ABNORMAL HIGH (ref 70–99)
Glucose-Capillary: 111 mg/dL — ABNORMAL HIGH (ref 70–99)
Glucose-Capillary: 113 mg/dL — ABNORMAL HIGH (ref 70–99)
Glucose-Capillary: 109 mg/dL — ABNORMAL HIGH (ref 70–99)

## 2018-10-05 LAB — BASIC METABOLIC PANEL
Anion gap: 16 — ABNORMAL HIGH (ref 5–15)
BUN: 65 mg/dL — AB (ref 6–20)
CHLORIDE: 94 mmol/L — AB (ref 98–111)
CO2: 27 mmol/L (ref 22–32)
CREATININE: 10.5 mg/dL — AB (ref 0.61–1.24)
Calcium: 9 mg/dL (ref 8.9–10.3)
GFR calc Af Amer: 6 mL/min — ABNORMAL LOW (ref >60)
GFR calc non Af Amer: 5 mL/min — ABNORMAL LOW (ref >60)
Glucose, Bld: 164 mg/dL — ABNORMAL HIGH (ref 70–99)
POTASSIUM: 4.2 mmol/L (ref 3.5–5.1)
Sodium: 137 mmol/L (ref 135–145)

## 2018-10-05 LAB — SURGICAL PATHOLOGY

## 2018-10-05 SURGERY — DIALYSIS/PERMA CATHETER INSERTION
Anesthesia: Choice

## 2018-10-05 MED ORDER — SODIUM CHLORIDE FLUSH 0.9 % IV SOLN
INTRAVENOUS | Status: AC
  Filled 2018-10-05: qty 20

## 2018-10-05 MED ORDER — METHYLPREDNISOLONE SODIUM SUCC 125 MG IJ SOLR: 125.0000 mg | Freq: Once | INTRAMUSCULAR | Status: DC | PRN

## 2018-10-05 MED ORDER — SODIUM CHLORIDE 0.9 % IV SOLN
INTRAVENOUS | Status: DC
  Administered 2018-10-05: 14:00:00 via INTRAVENOUS

## 2018-10-05 MED ORDER — DIPHENHYDRAMINE HCL 50 MG/ML IJ SOLN
INTRAMUSCULAR | Status: AC
  Filled 2018-10-05: qty 1

## 2018-10-05 MED ORDER — FAMOTIDINE 20 MG PO TABS: 40.0000 mg | ORAL_TABLET | Freq: Once | ORAL | Status: DC | PRN

## 2018-10-05 MED ORDER — FENTANYL CITRATE (PF) 100 MCG/2ML IJ SOLN
INTRAMUSCULAR | Status: AC
  Filled 2018-10-05: qty 2

## 2018-10-05 MED ORDER — DIPHENHYDRAMINE HCL 50 MG/ML IJ SOLN
INTRAMUSCULAR | Status: DC | PRN
  Administered 2018-10-05: 25 mg via INTRAVENOUS

## 2018-10-05 MED ORDER — LIDOCAINE-EPINEPHRINE (PF) 1 %-1:200000 IJ SOLN
INTRAMUSCULAR | Status: AC
  Filled 2018-10-05: qty 30

## 2018-10-05 MED ORDER — HEPARIN (PORCINE) IN NACL 1000-0.9 UT/500ML-% IV SOLN
INTRAVENOUS | Status: AC
  Filled 2018-10-05: qty 500

## 2018-10-05 MED ORDER — HEPARIN SODIUM (PORCINE) 10000 UNIT/ML IJ SOLN
INTRAMUSCULAR | Status: AC
  Filled 2018-10-05: qty 1

## 2018-10-05 MED ORDER — ONDANSETRON HCL 4 MG/2ML IJ SOLN: 4.0000 mg | Freq: Four times a day (QID) | INTRAMUSCULAR | Status: DC | PRN

## 2018-10-05 MED ORDER — MIDAZOLAM HCL 5 MG/5ML IJ SOLN
INTRAMUSCULAR | Status: AC
  Filled 2018-10-05: qty 5

## 2018-10-05 MED ORDER — DIPHENHYDRAMINE HCL 50 MG/ML IJ SOLN: 50.0000 mg | Freq: Once | INTRAMUSCULAR | Status: DC | PRN

## 2018-10-05 MED ORDER — HYDROMORPHONE HCL 1 MG/ML IJ SOLN: 1.0000 mg | Freq: Once | INTRAMUSCULAR | Status: DC | PRN

## 2018-10-05 MED ORDER — MIDAZOLAM HCL 2 MG/ML PO SYRP: 8.0000 mg | ORAL_SOLUTION | Freq: Once | ORAL | Status: DC | PRN

## 2018-10-05 MED ORDER — MIDAZOLAM HCL 2 MG/2ML IJ SOLN
INTRAMUSCULAR | Status: DC | PRN
  Administered 2018-10-05: 2 mg via INTRAVENOUS
  Administered 2018-10-05: 1 mg via INTRAVENOUS

## 2018-10-05 MED ORDER — METRONIDAZOLE 500 MG PO TABS: ORAL_TABLET | ORAL | 0 refills | Status: DC

## 2018-10-05 SURGICAL SUPPLY — 9 items
ADH SKN CLS APL DERMABOND .7 (GAUZE/BANDAGES/DRESSINGS) ×1
CATH PALINDROME-P 44CM KIT (CATHETERS) ×3
DERMABOND ADVANCED (GAUZE/BANDAGES/DRESSINGS) ×2
DERMABOND ADVANCED .7 DNX12 (GAUZE/BANDAGES/DRESSINGS) IMPLANT
GUIDEWIRE SUPER STIFF .035X180 (WIRE) ×2 IMPLANT
KIT CATH 64X44X15FR RVRS (CATHETERS) IMPLANT
PACK ANGIOGRAPHY (CUSTOM PROCEDURE TRAY) ×2 IMPLANT
SUT MNCRL AB 4-0 PS2 18 (SUTURE) ×2 IMPLANT
SUT PROLENE 0 CT 1 30 (SUTURE) ×2 IMPLANT

## 2018-10-05 NOTE — Progress Notes (Signed)
Pre HD [facility]   10/05/18 1543  Hand-Off documentation  Report given to (Full Name) Beatris Ship, RN   Report received from (Full Name) Tiburcio Pea, RN   Vital Signs  Temp 98.1 F (36.7 C)  Temp Source Oral  Pulse Rate 80  Resp 12  BP 133/65  BP Location Left Arm  BP Method Automatic  Patient Position (if appropriate) Lying  Oxygen Therapy  SpO2 96 %  O2 Device Room Air  Pain Assessment  Pain Scale 0-10  Pain Score 0  Time-Out for Hemodialysis  What Procedure? HD  Pt Identifiers(min of two) First/Last Name;MRN/Account#  Correct Site? Yes  Correct Side? Yes  Correct Procedure? Yes  Consents Verified? Yes  Rad Studies Available? N/A  Safety Precautions Reviewed? Yes  Engineer, civil (consulting) Number 3  Station Number 2  UF/Alarm Test Passed  Conductivity: Meter 14  Conductivity: Machine  14  pH 7.4  Reverse Osmosis Main  Normal Saline Lot Number [id number]  Dialyzer Lot Number [id number]  Disposable Set Lot Number [id number]  Machine Temperature 98.6 F (37 C)  Musician and Audible Yes  Blood Lines Intact and Secured Yes  Pre Treatment Patient Checks  Vascular access used during treatment Catheter  Hepatitis B Surface Antigen Results Negative  Date Hepatitis B Surface Antigen Drawn 09/29/18  Hepatitis B Surface Antibody  (>10)  Date Hepatitis B Surface Antibody Drawn 09/29/18  Hemodialysis Consent Verified Yes  Hemodialysis Standing Orders Initiated Yes  ECG (Telemetry) Monitor On Yes  Prime Ordered Normal Saline  Length of  DialysisTreatment -hour(s) 3.5 Hour(s)  Dialysis Treatment Comments Na 140  Dialyzer Elisio 17H NR  Dialysate 2K, 2.5 Ca  Dialysis Anticoagulant None  Dialysate Flow Ordered 800  Blood Flow Rate Ordered 400 mL/min  Ultrafiltration Goal 3 Liters  Dialysis Blood Pressure Support Ordered Normal Saline  Education / Care Plan  Dialysis Education Provided Yes  Documented Education in Care Plan Yes  Hemodialysis Catheter Right Femoral vein  Permanent;Double-lumen  Placement Date/Time: 10/05/18 1458   Time Out: Correct patient;Correct site;Correct procedure  Maximum sterile barrier precautions: Hand hygiene;Cap;Mask;Sterile gown;Sterile gloves;Large sterile sheet  Site Prep: Chlorhexidine  Local Anesthetic: Inje...  Site Condition No complications  Blue Lumen Status Blood return noted  Red Lumen Status Blood return noted  Purple Lumen Status N/A  Dressing Status Clean;Dry;Intact  Dressing Change Due 10/06/18

## 2018-10-05 NOTE — Progress Notes (Signed)
HD Tx started w/o complication    00/86/76 1545  Vital Signs  Pulse Rate 81  Pulse Rate Source Monitor  Resp (!) 9  BP 129/67  BP Location Left Arm  BP Method Automatic  Patient Position (if appropriate) Lying  Oxygen Therapy  SpO2 96 %  O2 Device Room Air  During Hemodialysis Assessment  Blood Flow Rate (mL/min) 400 mL/min  Arterial Pressure (mmHg) -220 mmHg  Venous Pressure (mmHg) 260 mmHg  Transmembrane Pressure (mmHg) 80 mmHg  Ultrafiltration Rate (mL/min) 1360 mL/min  Dialysate Flow Rate (mL/min) 800 ml/min  Conductivity: Machine  14  HD Safety Checks Performed Yes  Dialysis Fluid Bolus Normal Saline  Bolus Amount (mL) 250 mL  Intra-Hemodialysis Comments Tx initiated

## 2018-10-05 NOTE — H&P (Signed)
[facility] History & Physical Update  The patient was interviewed and re-examined.  The patient's previous History and Physical has been reviewed and is unchanged.  There is no change in the plan of care. We plan to proceed with the scheduled procedure.  Leotis Pain, MD  10/05/2018, 1:28 PM

## 2018-10-05 NOTE — Progress Notes (Signed)
HD Tx completed tolerated well, challenge UF at 4KG pt tolerated very well.    10/05/18 1915  Vital Signs  Pulse Rate 91  Resp (!) 21  BP (!) 71/40 (cuff readjusted, B/P rechecked )  BP Location Left Arm  BP Method Automatic  Patient Position (if appropriate) Lying  Oxygen Therapy  SpO2 100 %  O2 Device Room Air  During Hemodialysis Assessment  HD Safety Checks Performed Yes  KECN 69.3 KECN  Dialysis Fluid Bolus Normal Saline  Bolus Amount (mL) 250 mL  Intra-Hemodialysis Comments Tx completed;Tolerated well

## 2018-10-05 NOTE — Op Note (Signed)
OPERATIVE NOTE    PRE-OPERATIVE DIAGNOSIS: 1. ESRD 2. Failed left thigh AVG now needing a permcath  POST-OPERATIVE DIAGNOSIS: same as above  PROCEDURE: 1. Fluoroscopic guidance for placement of catheter 2. Placement of a 44 cm tip to cuff tunneled hemodialysis catheter via the right femoral vein  SURGEON: Leotis Pain, MD  ANESTHESIA:  Local with moderate conscious sedation for approximately 15 minutes using 3 mg of Versed and 25 mg of Benadryl  ESTIMATED BLOOD LOSS: 5 cc  FINDING(S):   none  SPECIMEN(S):  None  INDICATIONS:   Patient is a 53 y.o.male who presents with renal failure and a failed left femoral PermCath despite attempts at getting this opened.   The patient needs long term dialysis access for their ESRD, and a Permcath is necessary.  Risks and benefits are discussed and informed consent is obtained.    DESCRIPTION: After obtaining full informed written consent, the patient was brought back to the vascular suited. Moderate conscious sedation was administered throughout the procedure with a face-to-face encounter with my presence for the entire procedure and with my supervision of the RN monitoring the patient's vital signs, pulse oximetry, telemetry, and mental status throughout the procedure. The patient's right groin was sterilely prepped and draped and a sterile surgical field was created.  I placed a wire through the existing temporary catheter in the right groin and remove the temporary catheter. After skin nick and dilatation, the peel-away sheath was placed over the wire. I then turned my attention to an area about 4-5 cm inferior and lateral to the access incision and a small counterincision was created.  I tunneled from the counter  incision to the access site. Using fluoroscopic guidance, a 29 centimeterer tip to cuff tunneled hemodialysis catheter was selected, and tunneled from the counter  incision to the access site. It was then placed through the peel-away sheath  and the peel-away sheath was removed. Using fluoroscopic guidance the catheter tips were parked in the retrohepatic vena cava just below the right atrium. The appropriate distal connectors were placed. It withdrew blood well and flushed easily with heparinized saline and a concentrated heparin solution was then placed. It was secured to the leg  with 2 Prolene sutures. The access incision was closed single 4-0 Monocryl. A 4-0 Monocryl pursestring suture was placed around the exit site. Sterile dressings were placed. The patient tolerated the procedure well and was taken to the recovery room in stable condition.  COMPLICATIONS: None  CONDITION: Stable    Leotis Pain 10/05/2018 3:04 PM   This note was created with Dragon Medical transcription system. Any errors in dictation are purely unintentional.

## 2018-10-05 NOTE — Progress Notes (Addendum)
PT Cancellation Note  Patient Details Name: [name] MRN: [id number] DOB: [birth year]   Cancelled Treatment:    Reason Eval/Treat Not Completed: Other (comment);Medical issues which prohibited therapy(Pt off floor for vascular procedure, then scheduled for HD. Will follow acutely, then attempt treatment again when appropriate. )  4:02 PM, 10/05/18 Etta Grandchild, PT, DPT Physical Therapist - [facility]  [phone] ([place])   Collins Kerby C 10/05/2018, 4:00 PM

## 2018-10-05 NOTE — Progress Notes (Signed)
Post HD Tx    10/05/18 1917  Hand-Off documentation  Report given to (Full Name) Lelon Frohlich RN   Report received from (Full Name) Beatris Ship, RN   Vital Signs  Temp 98 F (36.7 C)  Temp Source Oral  Pulse Rate 75  Pulse Rate Source Monitor  Resp (!) 33  BP 108/73  BP Location Left Arm  BP Method Automatic  Patient Position (if appropriate) Lying  Oxygen Therapy  SpO2 97 %  O2 Device Room Air  Pain Assessment  Pain Scale 0-10  Pain Score 0  Dialysis Weight  Weight 101.1 kg  Type of Weight Post-Dialysis  Post-Hemodialysis Assessment  Rinseback Volume (mL) 250 mL  KECN 69.3 V  Dialyzer Clearance Lightly streaked  Duration of HD Treatment -hour(s) 3.5 hour(s)  Hemodialysis Intake (mL) 500 mL  UF Total -Machine (mL) 4503 mL  Net UF (mL) 4003 mL  Tolerated HD Treatment Yes  Hemodialysis Catheter Right Femoral vein Permanent;Double-lumen  Placement Date/Time: 10/05/18 1458   Time Out: Correct patient;Correct site;Correct procedure  Maximum sterile barrier precautions: Hand hygiene;Cap;Mask;Sterile gown;Sterile gloves;Large sterile sheet  Site Prep: Chlorhexidine  Local Anesthetic: Inje...  Site Condition No complications  Catheter fill solution Heparin 1000 units/ml  Catheter fill volume (Arterial) 2.5 cc  Catheter fill volume (Venous) 2.5  Dressing Change Due 10/06/18  Post treatment catheter status Capped and Clamped

## 2018-10-05 NOTE — Discharge Summary (Signed)
[facility] [place] NAME: [name]    MR#:  [id number]  DATE OF BIRTH:  04-27-1966  DATE OF ADMISSION:  09/28/2018   ADMITTING PHYSICIAN: Gorden Harms, MD  DATE OF DISCHARGE: 10/05/2018 PRIMARY CARE PHYSICIAN: Sandi Mariscal, MD   ADMISSION DIAGNOSIS:  Infection of AV graft for dialysis ([place]) [T82.7XXA] Increased anion gap metabolic acidosis [N27.7] DISCHARGE DIAGNOSIS:  Active Problems:   Infection of AV graft for dialysis ([place])  SECONDARY DIAGNOSIS:   Past Medical History:  Diagnosis Date  . Amputee, below knee, left ([place])   . Anemia   . Blood transfusion   . Bronchitis   . Chest pain   . CHF (congestive heart failure) ([place])   . Chronic kidney disease    dialysis, M-W-F, [facility]  . Diabetes mellitus    type 2  . Dysrhythmia    irregular heartbeat  . ESRD (end stage renal disease) on dialysis ([place])    first HD 06/19/04  . GERD (gastroesophageal reflux disease)   . Hyperparathyroidism ([place])   . Morbid obesity [facility])    HOSPITAL COURSE:  53 year old male with end-stage renal disease on hemodialysis, tobacco dependence and obesity who presented for emergent room from dialysis due to fistula ulceration/infection  1.  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  Antibiotics was discontinued. Permacath placement today and then HD per Dr. Juleen China  As per wound care consult:  Silver hydrofiber for exudate management.  Nursing to put crushed Flagyl on open wound of thigh.  2.  End-stage renal disease on hemodialysis: Continue hemodialysis via temporary catheter.  Hemodialysis after permacath placement today per  Dr. Juleen China.  3.  Diabetes: Continue sliding scale.  4. Tobacco dependence: Patient is encouraged to quit smoking and willing to attempt to quit was assessed. Patient highly motivated.Counseling was provided for 4 minutes. Continue nicotoine patch  5.  Anemia of chronic disease: Continue Epogen  6.  Calciphylaxis: Continue sodium p.o. sulfate  Discussed with Dr. Juleen China  DISCHARGE CONDITIONS:  Stable, discharge to home with home health. CONSULTS OBTAINED:  Treatment Team:  Algernon Huxley, MD DRUG ALLERGIES:   Allergies  Allergen Reactions  . Hydrocodone Nausea And Vomiting  . Dilaudid [Hydromorphone Hcl] Itching  . Fentanyl Itching   DISCHARGE MEDICATIONS:   Allergies as of 10/05/2018      Reactions   Hydrocodone Nausea And Vomiting   Dilaudid [hydromorphone Hcl] Itching   Fentanyl Itching      Medication List    TAKE these medications   acetaminophen 500 MG tablet Commonly known as:  TYLENOL Take 1,000 mg by mouth as needed for moderate pain (takes everytime with dialysis).   aspirin 81 MG EC tablet Take 1 tablet (81 mg total) by mouth daily.   Auryxia 1 GM 210 MG(Fe) tablet Generic drug:  ferric citrate Take 1 tablet by mouth 3 (three) times daily with meals.   b complex-vitamin c-folic acid 0.8 MG Tabs tablet Take 1 tablet by mouth at bedtime.   cyclobenzaprine 5 MG tablet Commonly known as:  FLEXERIL Take 5 mg by mouth as needed for muscle spasms.   ibuprofen 200 MG tablet Commonly known  as:  ADVIL,MOTRIN Take 400 mg by mouth every 6 (six) hours as needed for moderate pain.   insulin detemir 100 UNIT/ML injection Commonly known as:  LEVEMIR Inject 0.2 mLs (20 Units total) into the skin at bedtime. What changed:    how much to take  additional instructions   metroNIDAZOLE 500 MG tablet Commonly known as:  Flagyl nursing to crush Flagyl daily and put on open wound of the thigh.   NovoLOG FlexPen 100 UNIT/ML FlexPen Generic drug:  insulin  aspart 2-3 Units as needed for high blood sugar. Per sliding scale   oxyCODONE-acetaminophen 5-325 MG tablet Commonly known as:  PERCOCET/ROXICET Take 1 tablet by mouth every 6 (six) hours as needed for severe pain. What changed:    how much to take  reasons to take this   pantoprazole 40 MG tablet Commonly known as:  PROTONIX Take 40 mg by mouth daily.   Trulicity 8.10 FB/5.1WC Sopn Generic drug:  Dulaglutide Inject 0.75 mg into the skin every Friday.   Ventolin HFA 108 (90 Base) MCG/ACT inhaler Generic drug:  albuterol Inhale 2 puffs into the lungs as needed for wheezing.            Durable Medical Equipment  (From admission, onward)         Start     Ordered   10/05/18 1123  For home use only DME Walker rolling  Once    Question:  Patient needs a walker to treat with the following condition  Answer:  Weakness generalized   10/05/18 1122           DISCHARGE INSTRUCTIONS:  See AVS.  If you experience worsening of your admission symptoms, develop shortness of breath, life threatening emergency, suicidal or homicidal thoughts you must seek medical attention immediately by calling 911 or calling your MD immediately  if symptoms less severe.  You Must read complete instructions/literature along with all the possible adverse reactions/side effects for all the Medicines you take and that have been prescribed to you. Take any new Medicines after you have completely understood and accpet all the possible adverse reactions/side effects.   Please note  You were cared for by a hospitalist during your hospital stay. If you have any questions about your discharge medications or the care you received while you were in the hospital after you are discharged, you can call the unit and asked to speak with the hospitalist on call if the hospitalist that took care of you is not available. Once you are discharged, your primary care physician will handle any further medical issues.  Please note that NO REFILLS for any discharge medications will be authorized once you are discharged, as it is imperative that you return to your primary care physician (or establish a relationship with a primary care physician if you do not have one) for your aftercare needs so that they can reassess your need for medications and monitor your lab values.    On the day of Discharge:  VITAL SIGNS:  Blood pressure 132/73, pulse 87, temperature 98 F (36.7 C), temperature source Oral, resp. rate 16, height 6\' 1"  (1.854 m), weight 102.9 kg, SpO2 99 %. PHYSICAL EXAMINATION:  GENERAL:  53 y.o.-year-old patient lying in the bed with no acute distress.  EYES: Pupils equal, round, reactive to light and accommodation. No scleral icterus. Extraocular muscles intact.  HEENT: Head atraumatic, normocephalic. Oropharynx and nasopharynx clear.  NECK:  Supple, no jugular venous distention. No thyroid enlargement, no tenderness.  LUNGS: Normal breath sounds bilaterally, no wheezing, rales,rhonchi or crepitation. No use of accessory muscles of respiration.  CARDIOVASCULAR: S1, S2 normal. No murmurs, rubs, or gallops.  ABDOMEN: Soft, non-tender, non-distended. Bowel sounds present. No organomegaly or mass.  EXTREMITIES: No pedal edema, cyanosis, or clubbing.  + BKA No edema and no tenderness. NEUROLOGIC: Cranial nerves II through XII are intact. Muscle strength 5/5 in all extremities. Sensation intact. Gait not checked.  PSYCHIATRIC: The patient is alert and oriented x 3.  SKIN: No obvious rash, lesion, or ulcer.  DATA REVIEW:   CBC Recent Labs  Lab 10/02/18 0310  WBC 10.3  HGB 8.7*  HCT 27.9*  PLT 232    Chemistries  Recent Labs  Lab 10/05/18 0423  NA 137  K 4.2  CL 94*  CO2 27  GLUCOSE 164*  BUN 65*  CREATININE 10.50*  CALCIUM 9.0     Microbiology Results  Results for orders placed or performed during the hospital encounter of 09/28/18  Blood culture (routine x 2)     Status: None    Collection Time: 09/28/18  2:41 PM  Result Value Ref Range Status   Specimen Description BLOOD RIGHT ANTECUBITAL  Final   Special Requests   Final    BOTTLES DRAWN AEROBIC AND ANAEROBIC Blood Culture results may not be optimal due to an excessive volume of blood received in culture bottles   Culture   Final    NO GROWTH 5 DAYS Performed at [facility], [address] [place], [place] [address]    Report Status 10/03/2018 FINAL  Final  Blood culture (routine x 2)     Status: None   Collection Time: 09/28/18  4:25 PM  Result Value Ref Range Status   Specimen Description BLOOD RIGHT ANTECUBITAL  Final   Special Requests   Final    BOTTLES DRAWN AEROBIC AND ANAEROBIC Blood Culture results may not be optimal due to an inadequate volume of blood received in culture bottles   Culture   Final    NO GROWTH 5 DAYS Performed at [facility], [address] [place], [place] [address]    Report Status 10/03/2018 FINAL  Final  MRSA PCR Screening     Status: None   Collection Time: 10/02/18 10:39 PM  Result Value Ref Range Status   MRSA by PCR NEGATIVE NEGATIVE Final    Comment:        The GeneXpert MRSA Assay (FDA approved for NASAL specimens only), is one component of a comprehensive MRSA colonization surveillance program. It is not intended to diagnose MRSA infection nor to guide or monitor treatment for MRSA infections. Performed at [facility], [address] [place],  [address]     RADIOLOGY:  No results found.   Management plans discussed with the patient, family and they are in agreement.  CODE STATUS: Full Code   TOTAL TIME TAKING CARE OF THIS PATIENT: 33 minutes.    Demetrios Loll M.D on 10/05/2018 at 3:13 PM  Between 7am to 6pm - Pager - [phone]  After 6pm go to [email or web address] - Proofreader  [facility]  Office  [phone]  CC: Primary care physician; Sandi Mariscal, MD   Note: This  dictation was prepared with Dragon dictation along with smaller phrase technology. Any transcriptional errors that result from this process are unintentional.

## 2018-10-05 NOTE — Care Management Important Message (Signed)
Important Message  Patient Details  Name: [name] MRN: [id number] Date of Birth: [birth year]   Medicare Important Message Given:  Yes    Dannette Barbara 10/05/2018, 10:51 AM

## 2018-10-05 NOTE — Progress Notes (Signed)
Delivered walker to patient room.

## 2018-10-05 NOTE — Progress Notes (Signed)
Pre HD Assessment    10/05/18 1540  Neurological  Level of Consciousness Alert  Orientation Level Oriented X4  Respiratory  Respiratory Pattern Regular  Chest Assessment Chest expansion symmetrical  Bilateral Breath Sounds Clear;Diminished  Cough None  Cardiac  Pulse Regular  Heart Sounds S1, S2  ECG Monitor Yes  Cardiac Rhythm NSR  Vascular  R Radial Pulse +2  L Radial Pulse +2  Psychosocial  Psychosocial (WDL) WDL

## 2018-10-05 NOTE — Progress Notes (Signed)
Discharge instructions reviewed with patient. Questions encouraged and answered to patient's satisfaction. Dressing to permcath site changed at patient's request due to some previous bleeding to site. All belongings were bagged and given to patient. Room was doubled checked for any belongings.  Patient escorted to ER exit by Jim Like  where patient's ride is waiting . Terrial Rhodes

## 2018-10-05 NOTE — Progress Notes (Signed)
Post HD Assessment    10/05/18 1917  Neurological  Level of Consciousness Alert  Orientation Level Oriented X4  Respiratory  Respiratory Pattern Regular  Chest Assessment Chest expansion symmetrical  Bilateral Breath Sounds Clear  Cough None  Cardiac  Pulse Regular  Heart Sounds S1, S2  ECG Monitor Yes  Cardiac Rhythm NSR  Vascular  R Radial Pulse +2  L Radial Pulse +2  Edema Generalized  Generalized Edema +1  Psychosocial  Psychosocial (WDL) WDL

## 2018-10-05 NOTE — TOC Transition Note (Signed)
Transition of Care [facility]) - CM/SW Discharge Note   Patient Details  Name: [name] MRN: [id number] Date of Birth: 12-04-1965  Transition [facility]) CM/SW Contact:  Ricky Sessions, RN Phone Number: 10/05/2018, 2:23 PM   Clinical Narrative:    Per MD patient to discharge home today.  Elvera Bicker dialysis liaison notified of discharge. Patient live at home with son. Patient states that at baseline he is able to drive himself. Son is available to provide transportation if needed.  PCP Nancy Fetter.  Pharmacy [facility].  Patient denies issues obtaining medications. PT has assessed patient and recommends home health PT.  Patient agreeable to services.  CMS Medicare.gov Compare Post Acute Care list reviewed with patient. Patient states he does not have a preference of home health agency. Referral made to [facility] with [place]. Patient states that he does not have any medical equipment in the home. Rolling walker to be delivered to room by Brad from [facility] prior to discharge.     Final next level of care: [place]     Patient Goals and CMS Choice Patient states their goals for this hospitalization and ongoing recovery are:: "they are saying im going to get out of here today" CMS Medicare.gov Compare Post Acute Care list provided to:: Patient Choice offered to / list presented to : Patient  Discharge Placement                       Discharge Plan and Services   Discharge Planning Services: CM Consult Post Acute Care Choice: Durable Medical Equipment, Home Health          DME Arranged: Walker rolling DME Agency: [facility] HH Arranged: RN, PT Anna Agency: [place] ([facility])   Social Determinants of Health (SDOH) Interventions     Readmission Risk Interventions No flowsheet data found.

## 2018-10-05 NOTE — Progress Notes (Signed)
[facility]  ROUNDING NOTE   Subjective:   Patient with no complaints.   Objective:  Vital signs in last 24 hours:  Temp:  [98 F (36.7 C)-98.9 F (37.2 C)] 98 F (36.7 C) (03/23 0529) Pulse Rate:  [83-91] 91 (03/23 0529) Resp:  [18-20] 18 (03/23 0529) BP: (118-136)/(54-72) 118/54 (03/23 0529) SpO2:  [94 %-99 %] 94 % (03/23 0529) Weight:  [102.9 kg] 102.9 kg (03/23 0529)  Weight change: 2.3 kg Filed Weights   10/03/18 0443 10/04/18 0300 10/05/18 0529  Weight: 99.2 kg 100.6 kg 102.9 kg    Intake/Output: I/O last 3 completed shifts: In: 200 [P.O.:200] Out: 0    Intake/Output this shift:  No intake/output data recorded.  Physical Exam: General: No acute distress  Head: Normocephalic, atraumatic. Moist oral mucosal membranes  Eyes: Anicteric  Neck: Supple, trachea midline  Lungs:  Clear to auscultation, normal effort  Heart: regular  Abdomen:  Soft, nontender, bowel sounds present  Extremities: Trace peripheral edema, left BKA  Neurologic: Awake, alert, oriented  Skin: Prurigo nodularis  Access: Right femoral dialysis catheter, L femoral AVG no bruit    Basic Metabolic Panel: Recent Labs  Lab 09/29/18 0910 09/30/18 0500 09/30/18 1056 10/01/18 0926 10/02/18 0310 10/02/18 1204 10/03/18 0444 10/05/18 0423  NA  --  139  --  141 141  --  139 137  K  --  4.1  --  4.3 4.4  --  3.8 4.2  CL  --  98  --  97* 96*  --  98 94*  CO2  --  28  --  23 21*  --  26 27  GLUCOSE  --  136*  --  115* 159*  --  184* 164*  BUN  --  48*  --  31* 37*  --  26* 65*  CREATININE  --  8.75*  --  6.49* 7.62*  --  6.42* 10.50*  CALCIUM  --  8.0*  --  8.8* 8.5*  --  8.7* 9.0  PHOS 7.9*  --  6.1*  --   --  4.1  --   --     Liver Function Tests: Recent Labs  Lab 09/28/18 1329  AST 16  ALT 14  ALKPHOS 60  BILITOT 0.5  PROT 7.4  ALBUMIN 3.3*   No results for input(s): LIPASE, AMYLASE in the last 168 hours. No results for input(s): AMMONIA in the last 168  hours.  CBC: Recent Labs  Lab 09/28/18 1329  09/29/18 0338 09/30/18 0500 10/01/18 0926 10/01/18 2242 10/02/18 0310  WBC 10.9*   < > 8.6 7.6 8.1 10.7* 10.3  NEUTROABS 8.6*  --   --   --  5.7  --   --   HGB 8.4*   < > 7.5* 8.0* 7.8* 9.2* 8.7*  HCT 27.4*   < > 24.7* 26.2* 25.1* 29.2* 27.9*  MCV 93.5   < > 93.9 94.2 91.3 90.4 91.5  PLT 269   < > 248 244 232 210 232   < > = values in this interval not displayed.    Cardiac Enzymes: No results for input(s): CKTOTAL, CKMB, CKMBINDEX, TROPONINI in the last 168 hours.  BNP: Invalid input(s): POCBNP  CBG: Recent Labs  Lab 10/04/18 1132 10/04/18 1701 10/04/18 2107 10/05/18 0752 10/05/18 1115  GLUCAP 211* 137* 191* 138* 111*    Microbiology: Results for orders placed or performed during the hospital encounter of 09/28/18  Blood culture (routine x 2)  Status: None   Collection Time: 09/28/18  2:41 PM  Result Value Ref Range Status   Specimen Description BLOOD RIGHT ANTECUBITAL  Final   Special Requests   Final    BOTTLES DRAWN AEROBIC AND ANAEROBIC Blood Culture results may not be optimal due to an excessive volume of blood received in culture bottles   Culture   Final    NO GROWTH 5 DAYS Performed at [facility], [address] [place], [place] [address]    Report Status 10/03/2018 FINAL  Final  Blood culture (routine x 2)     Status: None   Collection Time: 09/28/18  4:25 PM  Result Value Ref Range Status   Specimen Description BLOOD RIGHT ANTECUBITAL  Final   Special Requests   Final    BOTTLES DRAWN AEROBIC AND ANAEROBIC Blood Culture results may not be optimal due to an inadequate volume of blood received in culture bottles   Culture   Final    NO GROWTH 5 DAYS Performed at [facility], [address] [place], [place] [address]    Report Status 10/03/2018 FINAL  Final  MRSA PCR Screening     Status: None   Collection Time: 10/02/18 10:39 PM  Result Value Ref Range Status   MRSA by PCR  NEGATIVE NEGATIVE Final    Comment:        The GeneXpert MRSA Assay (FDA approved for NASAL specimens only), is one component of a comprehensive MRSA colonization surveillance program. It is not intended to diagnose MRSA infection nor to guide or monitor treatment for MRSA infections. Performed at [facility], [address] [place], [place] [address]     Coagulation Studies: No results for input(s): LABPROT, INR in the last 72 hours.  Urinalysis: No results for input(s): COLORURINE, LABSPEC, PHURINE, GLUCOSEU, HGBUR, BILIRUBINUR, KETONESUR, PROTEINUR, UROBILINOGEN, NITRITE, LEUKOCYTESUR in the last 72 hours.  Invalid input(s): APPERANCEUR    Imaging: No results found.   Medications:   . sodium chloride Stopped (09/28/18 2333)  .  ceFAZolin (ANCEF) IV Stopped (10/05/18 0622)  . sodium thiosulfate infusion for calciphylaxis Stopped (10/02/18 1710)   . Chlorhexidine Gluconate Cloth  6 each Topical Q0600  . epoetin (EPOGEN/PROCRIT) injection  10,000 Units Intravenous Q M,W,F-HD  . epoetin (EPOGEN/PROCRIT) injection  10,000 Units Intravenous Q M,W,F-HD  . ferric citrate  630 mg Oral TID WC  . heparin  5,000 Units Subcutaneous Q8H  . insulin aspart  0-5 Units Subcutaneous QHS  . insulin aspart  0-9 Units Subcutaneous TID WC  . multivitamin  1 tablet Oral QHS  . nicotine  7 mg Transdermal Daily  . pantoprazole  40 mg Oral Daily  . sodium chloride flush  3 mL Intravenous Q12H   sodium chloride, acetaminophen, albuterol, alum & mag hydroxide-simeth, cyclobenzaprine, diphenhydrAMINE, HYDROmorphone (DILAUDID) injection, ondansetron **OR** ondansetron (ZOFRAN) IV, ondansetron (ZOFRAN) IV, oxyCODONE-acetaminophen, polyethylene glycol, sodium chloride flush  Assessment/ Plan:  53 y.o. male with a PMHx of ESRD on HD MWF, anemia chronic kidney disease, secondary hyperparathyroidism, diabetes mellitus type 2, calciphylaxis, left BKA, left thigh graft, who was admitted to  [facility] on 09/28/2018 for evaluation of drainage from left groin AV graft.  [facility]/MWF  1.  ESRD on HD.   With complication of hemodialysis device.  - CVC scheduled for later today. Then will have hemodialysis treatment.   2.  Secondary Hyperparathyroidism with Calciphylaxis - IV sodium thiosulfate with HD treatments - Auryxia  3.  Anemia chronic kidney  disease.   - EPO with HD treatment  4.  Hypertension: blood pressure at goal  5. Diabetes mellitus type II with chronic kidney disease: insulin dependent.    LOS: 7 Gerry Heaphy 3/23/20201:12 PM

## 2018-10-06 ENCOUNTER — Encounter: Payer: Self-pay | Admitting: Vascular Surgery

## 2018-10-12 ENCOUNTER — Other Ambulatory Visit (INDEPENDENT_AMBULATORY_CARE_PROVIDER_SITE_OTHER): Payer: Self-pay | Admitting: Nurse Practitioner

## 2018-10-27 ENCOUNTER — Encounter (INDEPENDENT_AMBULATORY_CARE_PROVIDER_SITE_OTHER): Payer: Self-pay | Admitting: Nurse Practitioner

## 2018-10-27 ENCOUNTER — Other Ambulatory Visit: Payer: Self-pay

## 2018-10-27 ENCOUNTER — Ambulatory Visit (INDEPENDENT_AMBULATORY_CARE_PROVIDER_SITE_OTHER): Payer: Medicare Other | Admitting: Nurse Practitioner

## 2018-10-27 VITALS — BP 121/75 | HR 98 | Resp 16 | Wt 220.2 lb

## 2018-10-27 DIAGNOSIS — Z992 Dependence on renal dialysis: Secondary | ICD-10-CM

## 2018-10-27 DIAGNOSIS — E1165 Type 2 diabetes mellitus with hyperglycemia: Secondary | ICD-10-CM

## 2018-10-27 DIAGNOSIS — N186 End stage renal disease: Secondary | ICD-10-CM

## 2018-10-27 DIAGNOSIS — T827XXA Infection and inflammatory reaction due to other cardiac and vascular devices, implants and grafts, initial encounter: Secondary | ICD-10-CM

## 2018-10-27 DIAGNOSIS — E1122 Type 2 diabetes mellitus with diabetic chronic kidney disease: Secondary | ICD-10-CM

## 2018-10-27 DIAGNOSIS — T827XXD Infection and inflammatory reaction due to other cardiac and vascular devices, implants and grafts, subsequent encounter: Secondary | ICD-10-CM

## 2018-10-27 NOTE — Progress Notes (Signed)
SUBJECTIVE:  Patient ID: [name], male    DOB: 1966/07/04, 53 y.o.   MRN: [id number] Chief Complaint  Patient presents with   Follow-up    HPI  [name] is a 53 y.o. male that presents today for wound evaluation as well as staple removal.  The wound appears well approximated throughout.  No evidence of dehiscence or infection.  Patient's only complaint is that the wound is very itchy.  He denies any fever, chills, nausea, vomiting or diarrhea.  He is currently maintained via PermCath and denies any issues with that at this time.  Staples were successfully removed at this time and replaced with Steri-Strips.  Patient tolerated well. Past Medical History:  Diagnosis Date   Amputee, below knee, left (HCC)    Anemia    Blood transfusion    Bronchitis    Chest pain    CHF (congestive heart failure) (HCC)    Chronic kidney disease    dialysis, [facility]W-F, [facility]   Diabetes mellitus    type 2   Dysrhythmia    irregular heartbeat   ESRD (end stage renal disease) on dialysis ([place])    first HD 06/19/04   GERD (gastroesophageal reflux disease)    Hyperparathyroidism ([place])    Morbid obesity ([place])     Past Surgical History:  Procedure Laterality Date   A/V SHUNTOGRAM Left 09/30/2018   Procedure: A/V SHUNTOGRAM;  Surgeon: Algernon Huxley, MD;  Location: [facility];  Service: Cardiovascular;  Laterality: Left;   AMPUTATION Left 01/20/2015   Procedure: Left Great Toe Amputation;  Surgeon: Newt Minion, MD;  Location: [place];  Service: Orthopedics;  Laterality: Left;   AMPUTATION Left 03/07/2015   Procedure: Left Foot 1st and 2nd Ray Amputation;  Surgeon: Newt Minion, MD;  Location: [place];  Service: Orthopedics;  Laterality: Left;   AMPUTATION Left 04/14/2015   Procedure: LEFT BELOW THE KNEE AMPUTATION ;  Surgeon: Newt Minion, MD;  Location: [place];  Service: Orthopedics;  Laterality: Left;   ANKLE FUSION     rt foot charcot joint 2008 with  refusion   AV FISTULA PLACEMENT  10/11/2010   AV FISTULA PLACEMENT  07/02/2011   Procedure: INSERTION OF ARTERIOVENOUS (AV) GORE-TEX GRAFT ARM;  Surgeon: Elam Dutch, MD;  Location: [place];  Service: Vascular;  Laterality: Left;   AV FISTULA PLACEMENT Right 11/06/2012   Procedure: INSERTION OF ARTERIOVENOUS (AV) GORE-TEX GRAFT ARM;  Surgeon: Mal Misty, MD;  Location: [place];  Service: Vascular;  Laterality: Right;   AV FISTULA PLACEMENT Right 03/21/2016   Procedure: INSERTION OF ARTERIOVENOUS (AV) GORE-TEX GRAFT ARM ( LOOP FOREARM );  Surgeon: Algernon Huxley, MD;  Location: [facility];  Service: Vascular;  Laterality: Right;   AV FISTULA PLACEMENT Left 11/07/2016   Procedure: INSERTION OF ARTERIOVENOUS (AV) Artegraft;  Surgeon: Algernon Huxley, MD;  Location: [facility];  Service: Vascular;  Laterality: Left;   AV FISTULA PLACEMENT Left 10/01/2018   Procedure: INSERTION OF ARTERIOVENOUS (AV) GORE-TEX GRAFT THIGH REVISION;  Surgeon: Algernon Huxley, MD;  Location: [facility];  Service: Vascular;  Laterality: Left;   [place] Right 11/06/2012   Procedure: REMOVAL OF ARTERIOVENOUS GORETEX GRAFT ([place]);  Surgeon: Mal Misty, MD;  Location: [place];  Service: Vascular;  Laterality: Right;   DIALYSIS/PERMA CATHETER INSERTION N/A 10/05/2018   Procedure: DIALYSIS/PERMA CATHETER INSERTION;  Surgeon: Algernon Huxley, MD;  Location: [place] [facility];  Service:  Cardiovascular;  Laterality: N/A;   ENDARTERECTOMY FEMORAL  11/07/2016   Procedure: ENDARTERECTOMY FEMORAL;  Surgeon: Algernon Huxley, MD;  Location: [facility];  Service: Vascular;;   HERNIA REPAIR     Umbilical Hernia   INSERTION OF DIALYSIS CATHETER  01/20/2012   Procedure: INSERTION OF DIALYSIS CATHETER;  Surgeon: Serafina Mitchell, MD;  Location: [place];  Service: Vascular;  Laterality: N/A;  insertion left internal jugular dialysis catheter 23cm   INSERTION OF DIALYSIS CATHETER Right 11/09/2012   Procedure: INSERTION OF DIALYSIS CATHETER;  Surgeon:  Conrad [place]in[place], MD;  Location: [place];  Service: Vascular;  Laterality: Right;  Right  Femoral Vein   INSERTION OF DIALYSIS CATHETER N/A 08/18/2015   Procedure: INSERTION OF DIALYSIS CATHETER RIGHT FEMORAL VEIN;  Surgeon: Mal Misty, MD;  Location: [place];  Service: Cardiovascular;  Laterality: N/A;   PARATHYROIDECTOMY     PERIPHERAL VASCULAR CATHETERIZATION Right 04/04/2016   Procedure: A/V Shuntogram/Fistulagram;  Surgeon: Algernon Huxley, MD;  Location: [place] [facility];  Service: Cardiovascular;  Laterality: Right;   REMOVAL OF A HERO DEVICE Left 08/14/2015   Procedure: REMOVAL OF LEFT ARM HERO DEVICE;  Surgeon: Rosetta Posner, MD;  Location: [place];  Service: Vascular;  Laterality: Left;   TEE WITHOUT CARDIOVERSION N/A 08/18/2015   Procedure: TRANSESOPHAGEAL ECHOCARDIOGRAM (TEE);  Surgeon: Mal Misty, MD;  Location: [place];  Service: Cardiovascular;  Laterality: N/A;   TEE WITHOUT CARDIOVERSION N/A 12/21/2015   Procedure: TRANSESOPHAGEAL ECHOCARDIOGRAM (TEE);  Surgeon: Dixie Dials, MD;  Location: [place];  Service: Cardiovascular;  Laterality: N/A;   TEMPORARY DIALYSIS CATHETER N/A 09/29/2018   Procedure: TEMPORARY DIALYSIS CATHETER INSERTION;  Surgeon: Katha Cabal, MD;  Location: [place] [facility];  Service: Cardiovascular;  Laterality: N/A;   THROMBECTOMY     Umbilcial Hernia     UPPER EXTREMITY VENOGRAPHY Left 10/31/2016   Procedure: Upper Extremity Venography;  Surgeon: Algernon Huxley, MD;  Location: [place] [facility];  Service: Cardiovascular;  Laterality: Left;   VENOGRAM N/A 01/06/2014   Procedure: VENOGRAM;  Surgeon: Conrad [place], MD;  Location[facility];  Service: Cardiovascular;  Laterality: N/A;    Social History   Socioeconomic History   Marital status: Married    Spouse name: Not on file   Number of children: Not on file   Years of education: Not on file   Highest education level: Not on file  Occupational History   Not on file  Social Needs     Financial resource strain: Not on file   Food insecurity:    Worry: Not on file    Inability: Not on file   Transportation needs:    Medical: Not on file    Non-medical: Not on file  Tobacco Use   Smoking status: Current Every Day Smoker    Years: 20.00    Types: Cigarettes   Smokeless tobacco: Never Used  Substance and Sexual Activity   Alcohol use: No    Alcohol/week: 0.0 standard drinks   Drug use: Yes    Comment: oxycodone prescribed   Sexual activity: Not Currently  Lifestyle   Physical activity:    Days per week: Not on file    Minutes per session: Not on file   Stress: Not on file  Relationships   Social connections:    Talks on phone: Not on file    Gets together: Not on file    Attends religious service: Not on file  Active member of club or organization: Not on file    Attends meetings of clubs or organizations: Not on file    Relationship status: Not on file   Intimate partner violence:    Fear of current or ex partner: Not on file    Emotionally abused: Not on file    Physically abused: Not on file    Forced sexual activity: Not on file  Other Topics Concern   Not on file  Social History Narrative   Not on file    Family History  Problem Relation Age of Onset   Heart disease Mother    Kidney disease Mother    Hypertension Mother     Allergies  Allergen Reactions   Hydrocodone Nausea And Vomiting   Dilaudid [Hydromorphone Hcl] Itching   Fentanyl Itching     Review of Systems   Review of Systems: Negative Unless Checked Constitutional: [] Weight loss  [] Fever  [] Chills Cardiac: [] Chest pain   []  Atrial Fibrillation  [] Palpitations   [] Shortness of breath when laying flat   [] Shortness of breath with exertion. [] Shortness of breath at rest Vascular:  [] Pain in legs with walking   [] Pain in legs with standing [] Pain in legs when laying flat   [] Claudication    [] Pain in feet when laying flat    [] History of DVT    [] Phlebitis   [] Swelling in legs   [] Varicose veins   [] Non-healing ulcers Pulmonary:   [] Uses home oxygen   [] Productive cough   [] Hemoptysis   [] Wheeze  [] COPD   [] Asthma Neurologic:  [] Dizziness   [] Seizures  [] Blackouts [] History of stroke   [] History of TIA  [] Aphasia   [] Temporary Blindness   [] Weakness or numbness in arm   [x] Weakness or numbness in leg Musculoskeletal:   [] Joint swelling   [] Joint pain   [] Low back pain  []  History of Knee Replacement [] Arthritis [] back Surgeries  []  Spinal Stenosis    Hematologic:  [] Easy bruising  [] Easy bleeding   [] Hypercoagulable state   [x] Anemic Gastrointestinal:  [] Diarrhea   [] Vomiting  [] Gastroesophageal reflux/heartburn   [] Difficulty swallowing. [] Abdominal pain Genitourinary:  [x] Chronic kidney disease   [] Difficult urination  [] Anuric   [] Blood in urine [] Frequent urination  [] Burning with urination   [] Hematuria Skin:  [] Rashes   [] Ulcers [x] Wounds Psychological:  [] History of anxiety   []  History of major depression  []  Memory Difficulties      OBJECTIVE:   Physical Exam  BP 121/75 (BP Location: Left Arm)    Pulse 98    Resp 16    Wt 220 lb 3.2 oz (99.9 kg)    BMI 29.05 kg/m   Gen: WD/WN, NAD Head: [place]/AT, No temporalis wasting.  Ear/Nose/Throat: Hearing grossly intact, nares w/o erythema or drainage Eyes: PER, EOMI, sclera nonicteric.  Neck: Supple, no masses.  No JVD.  Pulmonary:  Good air movement, no use of accessory muscles.  Cardiac: RRR Vascular:  thigh wound approximately 2 inches, well approximated Vessel Right Left  Radial Palpable Palpable  Gastrointestinal: soft, non-distended. No guarding/no peritoneal signs.  Musculoskeletal: Left below-knee amputation.  No deformity or atrophy.  Neurologic: Pain and light touch intact in extremities.  Symmetrical.  Speech is fluent. Motor exam as listed above. Psychiatric: Judgment intact, Mood & affect appropriate for pt's clinical situation. Dermatologic:  Multiple small wounds  noted to leg. No changes consistent with cellulitis. Lymph : No Cervical lymphadenopathy, no lichenification or skin changes of chronic lymphedema.       ASSESSMENT  AND PLAN:  1. Infection of AV graft for dialysis [facility]) Currently no signs of infection are evident.  Wound is healing well.  Patient advised to let Steri-Strips remain until he follow-up on their own.  We will reevaluate the wound following thrombectomy of his graft.  2. ESRD on dialysis [facility]) Recommend:  The patient is experiencing increasing problems with their dialysis access.  Patient should have a thrombectomy of his left thigh graft.   The intention for intervention is to restore appropriate flow and prevent thrombosis and possible loss of the access.  As well as improve the quality of dialysis therapy.  The risks, benefits and alternative therapies were reviewed in detail with the patient.  All questions were answered.  The patient agrees to proceed with angio/intervention.    The patient also understands that if the thrombectomy is not successful the next step will be to discuss placement of a thigh graft on his right lower extremity.  3. Uncontrolled type 2 diabetes mellitus with hyperglycemia ([place]) Continue hypoglycemic medications as already ordered, these medications have been reviewed and there are no changes at this time.  Hgb A1C to be monitored as already arranged by primary service    Current Outpatient Medications on File Prior to Visit  Medication Sig Dispense Refill   acetaminophen (TYLENOL) 500 MG tablet Take 1,000 mg by mouth as needed for moderate pain (takes everytime with dialysis).     aspirin EC 81 MG EC tablet Take 1 tablet (81 mg total) by mouth daily. 30 tablet 11   AURYXIA 1 GM 210 MG(Fe) tablet Take 1 tablet by mouth 3 (three) times daily with meals.     b complex-vitamin c-folic acid (NEPHRO-VITE) 0.8 MG TABS tablet Take 1 tablet by mouth at bedtime. 30 tablet 0   cyclobenzaprine  (FLEXERIL) 5 MG tablet Take 5 mg by mouth as needed for muscle spasms.     Dulaglutide (TRULICITY) 0.24 OX/7.3ZH SOPN Inject 0.75 mg into the skin every Friday.      ibuprofen (ADVIL,MOTRIN) 200 MG tablet Take 400 mg by mouth every 6 (six) hours as needed for moderate pain.     insulin detemir (LEVEMIR) 100 UNIT/ML injection Inject 0.2 mLs (20 Units total) into the skin at bedtime. (Patient taking differently: Inject 4-20 Units into the skin at bedtime. Per sliding scale) 10 mL 11   metroNIDAZOLE (FLAGYL) 500 MG tablet nursing to crush Flagyl daily and put on open wound of the thigh. 30 tablet 0   NOVOLOG FLEXPEN 100 UNIT/ML FlexPen 2-3 Units as needed for high blood sugar. Per sliding scale     oxyCODONE-acetaminophen (PERCOCET/ROXICET) 5-325 MG tablet Take 1 tablet by mouth every 6 (six) hours as needed for severe pain. (Patient taking differently: Take 2 tablets by mouth every 6 (six) hours as needed for severe pain (leg pain). ) 15 tablet 0   pantoprazole (PROTONIX) 40 MG tablet Take 40 mg by mouth daily.     VENTOLIN HFA 108 (90 Base) MCG/ACT inhaler Inhale 2 puffs into the lungs as needed for wheezing.      Current Facility-Administered Medications on File Prior to Visit  Medication Dose Route Frequency Provider Last Rate Last Dose   Chlorhexidine Gluconate Cloth 2 % PADS 6 each  6 each Topical Once Stegmayer, Kimberly A, PA-C       And   Chlorhexidine Gluconate Cloth 2 % PADS 6 each  6 each Topical Once Stegmayer, Kimberly A, PA-C        There  are no Patient Instructions on file for this visit. No follow-ups on file.   Kris Hartmann, NP  This note was completed with Sales executive.  Any errors are purely unintentional.

## 2018-10-30 ENCOUNTER — Encounter (INDEPENDENT_AMBULATORY_CARE_PROVIDER_SITE_OTHER): Payer: Self-pay

## 2018-10-30 ENCOUNTER — Telehealth (INDEPENDENT_AMBULATORY_CARE_PROVIDER_SITE_OTHER): Payer: Self-pay

## 2018-10-30 NOTE — Telephone Encounter (Signed)
I attempted to contact the patient to schedule him for a thrombectomy. I left a message for a return call.

## 2018-10-30 NOTE — Telephone Encounter (Signed)
[facility] and spoke with [place]. [name] did ask the [name] if he could do his procedure on Thursday 11/05/2018 with Dr. Lucky Cowboy, the [name] stated he could do the procedure on that day. I gave [name] the pre-procedure instructions for the [name] as well as the information regarding zero visitor policy. [name] has a 7:45 am arrival time. This will also be mailed out to the [name].

## 2018-11-02 ENCOUNTER — Other Ambulatory Visit (INDEPENDENT_AMBULATORY_CARE_PROVIDER_SITE_OTHER): Payer: Self-pay | Admitting: Nurse Practitioner

## 2018-11-02 NOTE — Telephone Encounter (Signed)
Patient called complaining of pain in his left thigh that has increased in pain. The patient is scheduled for a thrombectomy with Dr. Lucky Cowboy on Thursday 11/05/2018. I advised that after his thrombectomy his pain should diminish and be much better, but in the mean time if it got worse to call our office or he may need to go to the ED to be evaluated. I did speak with Eulogio Ditch NP regarding this as well.

## 2018-11-05 ENCOUNTER — Ambulatory Visit: Payer: Medicare Other | Admitting: Anesthesiology

## 2018-11-05 ENCOUNTER — Other Ambulatory Visit: Payer: Self-pay

## 2018-11-05 ENCOUNTER — Encounter
Admission: RE | Disposition: A | Discharge status: Home / Self Care | Payer: Self-pay | Source: Home / Self Care | Attending: Vascular Surgery

## 2018-11-05 ENCOUNTER — Encounter: Payer: Self-pay | Admitting: Vascular Surgery

## 2018-11-05 ENCOUNTER — Ambulatory Visit: Admit: 2018-11-05 | Payer: Medicare Other | Admitting: Vascular Surgery

## 2018-11-05 ENCOUNTER — Ambulatory Visit
Admission: RE | Admit: 2018-11-05 | Discharge: 2018-11-05 | Disposition: A | Discharge status: Home / Self Care | Payer: Medicare Other | Attending: Vascular Surgery | Admitting: Vascular Surgery

## 2018-11-05 DIAGNOSIS — N186 End stage renal disease: Secondary | ICD-10-CM | POA: Diagnosis not present

## 2018-11-05 DIAGNOSIS — E1151 Type 2 diabetes mellitus with diabetic peripheral angiopathy without gangrene: Secondary | ICD-10-CM | POA: Diagnosis not present

## 2018-11-05 DIAGNOSIS — F17210 Nicotine dependence, cigarettes, uncomplicated: Secondary | ICD-10-CM | POA: Diagnosis not present

## 2018-11-05 DIAGNOSIS — D649 Anemia, unspecified: Secondary | ICD-10-CM | POA: Insufficient documentation

## 2018-11-05 DIAGNOSIS — E1122 Type 2 diabetes mellitus with diabetic chronic kidney disease: Secondary | ICD-10-CM | POA: Diagnosis not present

## 2018-11-05 DIAGNOSIS — Z79899 Other long term (current) drug therapy: Secondary | ICD-10-CM | POA: Diagnosis not present

## 2018-11-05 DIAGNOSIS — Z885 Allergy status to narcotic agent status: Secondary | ICD-10-CM | POA: Insufficient documentation

## 2018-11-05 DIAGNOSIS — Y832 Surgical operation with anastomosis, bypass or graft as the cause of abnormal reaction of the patient, or of later complication, without mention of misadventure at the time of the procedure: Secondary | ICD-10-CM | POA: Insufficient documentation

## 2018-11-05 DIAGNOSIS — Z794 Long term (current) use of insulin: Secondary | ICD-10-CM | POA: Diagnosis not present

## 2018-11-05 DIAGNOSIS — I509 Heart failure, unspecified: Secondary | ICD-10-CM | POA: Insufficient documentation

## 2018-11-05 DIAGNOSIS — K219 Gastro-esophageal reflux disease without esophagitis: Secondary | ICD-10-CM | POA: Insufficient documentation

## 2018-11-05 DIAGNOSIS — T827XXA Infection and inflammatory reaction due to other cardiac and vascular devices, implants and grafts, initial encounter: Secondary | ICD-10-CM

## 2018-11-05 DIAGNOSIS — T82868A Thrombosis of vascular prosthetic devices, implants and grafts, initial encounter: Secondary | ICD-10-CM

## 2018-11-05 DIAGNOSIS — Z7982 Long term (current) use of aspirin: Secondary | ICD-10-CM | POA: Diagnosis not present

## 2018-11-05 DIAGNOSIS — Z89512 Acquired absence of left leg below knee: Secondary | ICD-10-CM | POA: Diagnosis not present

## 2018-11-05 DIAGNOSIS — Z992 Dependence on renal dialysis: Secondary | ICD-10-CM | POA: Insufficient documentation

## 2018-11-05 DIAGNOSIS — T829XXA Unspecified complication of cardiac and vascular prosthetic device, implant and graft, initial encounter: Secondary | ICD-10-CM | POA: Diagnosis not present

## 2018-11-05 HISTORY — PX: REMOVAL OF GRAFT: SHX6361

## 2018-11-05 LAB — CBC WITH DIFFERENTIAL/PLATELET
Abs Immature Granulocytes: 0.04 10*3/uL (ref 0.00–0.07)
Basophils Absolute: 0 10*3/uL (ref 0.0–0.1)
Basophils Relative: 1 %
Eosinophils Absolute: 0.3 10*3/uL (ref 0.0–0.5)
Eosinophils Relative: 5 %
HCT: 29.7 % — ABNORMAL LOW (ref 39.0–52.0)
Hemoglobin: 8.9 g/dL — ABNORMAL LOW (ref 13.0–17.0)
Immature Granulocytes: 1 %
Lymphocytes Relative: 17 %
Lymphs Abs: 1.2 10*3/uL (ref 0.7–4.0)
MCH: 28 pg (ref 26.0–34.0)
MCHC: 30 g/dL (ref 30.0–36.0)
MCV: 93.4 fL (ref 80.0–100.0)
Monocytes Absolute: 0.8 10*3/uL (ref 0.1–1.0)
Monocytes Relative: 11 %
Neutro Abs: 4.8 10*3/uL (ref 1.7–7.7)
Neutrophils Relative %: 65 %
Platelets: 191 10*3/uL (ref 150–400)
RBC: 3.18 MIL/uL — ABNORMAL LOW (ref 4.22–5.81)
RDW: 18.2 % — ABNORMAL HIGH (ref 11.5–15.5)
WBC: 7.2 10*3/uL (ref 4.0–10.5)
nRBC: 0 % (ref 0.0–0.2)

## 2018-11-05 LAB — POCT I-STAT 4, (NA,K, GLUC, HGB,HCT)
Glucose, Bld: 168 mg/dL — ABNORMAL HIGH (ref 70–99)
HCT: 27 % — ABNORMAL LOW (ref 39.0–52.0)
Hemoglobin: 9.2 g/dL — ABNORMAL LOW (ref 13.0–17.0)
Potassium: 4.1 mmol/L (ref 3.5–5.1)
Sodium: 135 mmol/L (ref 135–145)

## 2018-11-05 LAB — GLUCOSE, CAPILLARY
Glucose-Capillary: 187 mg/dL — ABNORMAL HIGH (ref 70–99)
Glucose-Capillary: 127 mg/dL — ABNORMAL HIGH (ref 70–99)

## 2018-11-05 LAB — BASIC METABOLIC PANEL
Anion gap: 23 — ABNORMAL HIGH (ref 5–15)
BUN: 39 mg/dL — ABNORMAL HIGH (ref 6–20)
CO2: 24 mmol/L (ref 22–32)
Calcium: 9 mg/dL (ref 8.9–10.3)
Chloride: 93 mmol/L — ABNORMAL LOW (ref 98–111)
Creatinine, Ser: 7.37 mg/dL — ABNORMAL HIGH (ref 0.61–1.24)
GFR calc Af Amer: 9 mL/min — ABNORMAL LOW (ref >60)
GFR calc non Af Amer: 8 mL/min — ABNORMAL LOW (ref >60)
Glucose, Bld: 165 mg/dL — ABNORMAL HIGH (ref 70–99)
Potassium: 4.2 mmol/L (ref 3.5–5.1)
Sodium: 140 mmol/L (ref 135–145)

## 2018-11-05 LAB — PROTIME-INR
INR: 1.2 (ref 0.8–1.2)
Prothrombin Time: 15.4 seconds — ABNORMAL HIGH (ref 11.4–15.2)

## 2018-11-05 LAB — TYPE AND SCREEN
ABO/RH(D): A POS
Antibody Screen: NEGATIVE

## 2018-11-05 LAB — APTT: aPTT: 44 seconds — ABNORMAL HIGH (ref 24–36)

## 2018-11-05 SURGERY — REMOVAL, GRAFT
Anesthesia: General | Laterality: Left

## 2018-11-05 SURGERY — PERIPHERAL VASCULAR THROMBECTOMY
Anesthesia: Moderate Sedation | Laterality: Left

## 2018-11-05 MED ORDER — PHENYLEPHRINE HCL (PRESSORS) 10 MG/ML IV SOLN
INTRAVENOUS | Status: DC | PRN
  Administered 2018-11-05 (×10): 100 ug via INTRAVENOUS

## 2018-11-05 MED ORDER — PROPOFOL 10 MG/ML IV BOLUS
INTRAVENOUS | Status: AC
  Filled 2018-11-05: qty 20

## 2018-11-05 MED ORDER — FENTANYL CITRATE (PF) 100 MCG/2ML IJ SOLN
INTRAMUSCULAR | Status: AC
  Filled 2018-11-05: qty 2

## 2018-11-05 MED ORDER — DIPHENHYDRAMINE HCL 50 MG/ML IJ SOLN
INTRAMUSCULAR | Status: AC
  Administered 2018-11-05: 12.5 mg
  Filled 2018-11-05: qty 1

## 2018-11-05 MED ORDER — CEFAZOLIN SODIUM-DEXTROSE 1-4 GM/50ML-% IV SOLN
INTRAVENOUS | Status: AC
  Filled 2018-11-05: qty 50

## 2018-11-05 MED ORDER — FAMOTIDINE 20 MG PO TABS
20.0000 mg | ORAL_TABLET | Freq: Once | ORAL | Status: AC
  Administered 2018-11-05: 20 mg via ORAL

## 2018-11-05 MED ORDER — BACITRACIN ZINC 500 UNIT/GM EX OINT
TOPICAL_OINTMENT | CUTANEOUS | Status: DC | PRN
  Administered 2018-11-05: 1 via TOPICAL

## 2018-11-05 MED ORDER — SODIUM CHLORIDE 0.9 % IV SOLN: INTRAVENOUS | Status: DC

## 2018-11-05 MED ORDER — PROPOFOL 10 MG/ML IV BOLUS
INTRAVENOUS | Status: DC | PRN
  Administered 2018-11-05: 50 mg via INTRAVENOUS
  Administered 2018-11-05: 150 mg via INTRAVENOUS
  Administered 2018-11-05 (×2): 30 mg via INTRAVENOUS

## 2018-11-05 MED ORDER — LIDOCAINE HCL (CARDIAC) PF 100 MG/5ML IV SOSY
PREFILLED_SYRINGE | INTRAVENOUS | Status: DC | PRN
  Administered 2018-11-05: 100 mg via INTRAVENOUS

## 2018-11-05 MED ORDER — BACITRACIN ZINC 500 UNIT/GM EX OINT
TOPICAL_OINTMENT | CUTANEOUS | Status: AC
  Filled 2018-11-05: qty 28.35

## 2018-11-05 MED ORDER — CEFAZOLIN SODIUM-DEXTROSE 1-4 GM/50ML-% IV SOLN
1.0000 g | INTRAVENOUS | Status: AC
  Administered 2018-11-05: 11:00:00 1 g via INTRAVENOUS

## 2018-11-05 MED ORDER — ONDANSETRON HCL 4 MG/2ML IJ SOLN: 4.0000 mg | Freq: Four times a day (QID) | INTRAMUSCULAR | Status: DC | PRN

## 2018-11-05 MED ORDER — SODIUM CHLORIDE 0.9 % IV SOLN
INTRAVENOUS | Status: DC | PRN
  Administered 2018-11-05: 12:00:00 30 ug/min via INTRAVENOUS

## 2018-11-05 MED ORDER — PROMETHAZINE HCL 25 MG/ML IJ SOLN: 6.2500 mg | INTRAMUSCULAR | Status: DC | PRN

## 2018-11-05 MED ORDER — CHLORHEXIDINE GLUCONATE CLOTH 2 % EX PADS: 6.0000 | MEDICATED_PAD | Freq: Once | CUTANEOUS | Status: DC

## 2018-11-05 MED ORDER — OXYCODONE-ACETAMINOPHEN 5-325 MG PO TABS: 2.0000 | ORAL_TABLET | Freq: Four times a day (QID) | ORAL | 0 refills | Status: AC | PRN

## 2018-11-05 MED ORDER — CEFAZOLIN SODIUM-DEXTROSE 2-4 GM/100ML-% IV SOLN: 2.0000 g | Freq: Once | INTRAVENOUS | Status: DC

## 2018-11-05 MED ORDER — OXYCODONE-ACETAMINOPHEN 7.5-325 MG PO TABS: 1.0000 | ORAL_TABLET | ORAL | 0 refills | Status: DC | PRN

## 2018-11-05 MED ORDER — ONDANSETRON HCL 4 MG/2ML IJ SOLN
INTRAMUSCULAR | Status: DC | PRN
  Administered 2018-11-05: 4 mg via INTRAVENOUS

## 2018-11-05 MED ORDER — HYDROMORPHONE HCL 1 MG/ML IJ SOLN: 1.0000 mg | Freq: Once | INTRAMUSCULAR | Status: DC | PRN

## 2018-11-05 MED ORDER — OXYCODONE HCL 5 MG PO TABS
ORAL_TABLET | ORAL | Status: AC
  Filled 2018-11-05: qty 2

## 2018-11-05 MED ORDER — BUPIVACAINE-EPINEPHRINE (PF) 0.5% -1:200000 IJ SOLN
INTRAMUSCULAR | Status: AC
  Filled 2018-11-05: qty 30

## 2018-11-05 MED ORDER — FENTANYL CITRATE (PF) 100 MCG/2ML IJ SOLN
INTRAMUSCULAR | Status: DC | PRN
  Administered 2018-11-05 (×2): 50 ug via INTRAVENOUS

## 2018-11-05 MED ORDER — HEPARIN SODIUM (PORCINE) 5000 UNIT/ML IJ SOLN
INTRAMUSCULAR | Status: AC
  Filled 2018-11-05: qty 1

## 2018-11-05 MED ORDER — SODIUM CHLORIDE 0.9 % IV SOLN
INTRAVENOUS | Status: DC
  Administered 2018-11-05: 11:00:00 via INTRAVENOUS

## 2018-11-05 MED ORDER — SODIUM CHLORIDE 0.9 % IV SOLN
INTRAVENOUS | Status: DC | PRN
  Administered 2018-11-05: 12:00:00 10 mL via INTRAMUSCULAR

## 2018-11-05 MED ORDER — PHENYLEPHRINE HCL (PRESSORS) 10 MG/ML IV SOLN
INTRAVENOUS | Status: AC
  Filled 2018-11-05: qty 1

## 2018-11-05 MED ORDER — AMOXICILLIN-POT CLAVULANATE 875-125 MG PO TABS: 1.0000 | ORAL_TABLET | Freq: Two times a day (BID) | ORAL | 0 refills | Status: AC

## 2018-11-05 MED ORDER — FAMOTIDINE 20 MG PO TABS
ORAL_TABLET | ORAL | Status: AC
  Filled 2018-11-05: qty 1

## 2018-11-05 MED ORDER — OXYCODONE HCL 5 MG PO TABS
5.0000 mg | ORAL_TABLET | Freq: Once | ORAL | Status: AC
  Administered 2018-11-05: 10 mg via ORAL

## 2018-11-05 MED ORDER — OXYCODONE HCL 5 MG PO TABS
10.0000 mg | ORAL_TABLET | Freq: Once | ORAL | Status: DC
  Filled 2018-11-05: qty 2

## 2018-11-05 SURGICAL SUPPLY — 63 items
ADH SKN CLS APL DERMABOND .7 (GAUZE/BANDAGES/DRESSINGS) ×1
BAG DECANTER FOR FLEXI CONT (MISCELLANEOUS) ×3 IMPLANT
BLADE SURG SZ11 CARB STEEL (BLADE) ×3 IMPLANT
BNDG GAUZE 4.5X4.1 6PLY STRL (MISCELLANEOUS) ×4 IMPLANT
BOOT SUTURE AID YELLOW STND (SUTURE) ×3 IMPLANT
BRUSH SCRUB EZ  4% CHG (MISCELLANEOUS) ×2
BRUSH SCRUB EZ 4% CHG (MISCELLANEOUS) ×1 IMPLANT
CANISTER SUCT 1200ML W/VALVE (MISCELLANEOUS) ×3 IMPLANT
CHLORAPREP W/TINT 26ML (MISCELLANEOUS) ×3 IMPLANT
CLIP SPRNG 6 S-JAW DBL (CLIP) ×1 IMPLANT
CLIP SPRNG 6MM S-JAW DBL (CLIP) ×3
COVER WAND RF STERILE (DRAPES) ×3 IMPLANT
DERMABOND ADVANCED (GAUZE/BANDAGES/DRESSINGS) ×2
DERMABOND ADVANCED .7 DNX12 (GAUZE/BANDAGES/DRESSINGS) ×1 IMPLANT
DRAPE INCISE IOBAN 66X45 STRL (DRAPES) ×3 IMPLANT
DRAPE LAPAROTOMY 100X77 ABD (DRAPES) ×3 IMPLANT
DRSG TEGADERM 6X8 (GAUZE/BANDAGES/DRESSINGS) ×2 IMPLANT
ELECT CAUTERY BLADE 6.4 (BLADE) ×3 IMPLANT
ELECT REM PT RETURN 9FT ADLT (ELECTROSURGICAL) ×3
ELECTRODE REM PT RTRN 9FT ADLT (ELECTROSURGICAL) ×1 IMPLANT
GAUZE PACKING IODOFORM 1/2 (PACKING) ×2 IMPLANT
GAUZE SPONGE 4X4 12PLY STRL (GAUZE/BANDAGES/DRESSINGS) ×2 IMPLANT
GLOVE BIO SURGEON STRL SZ7 (GLOVE) ×6 IMPLANT
GLOVE INDICATOR 7.5 STRL GRN (GLOVE) ×3 IMPLANT
GOWN STRL REUS W/ TWL LRG LVL3 (GOWN DISPOSABLE) ×2 IMPLANT
GOWN STRL REUS W/ TWL XL LVL3 (GOWN DISPOSABLE) ×1 IMPLANT
GOWN STRL REUS W/TWL LRG LVL3 (GOWN DISPOSABLE) ×3
GOWN STRL REUS W/TWL XL LVL3 (GOWN DISPOSABLE) ×3
HEMOSTAT SURGICEL 2X3 (HEMOSTASIS) ×1 IMPLANT
IV NS 500ML (IV SOLUTION) ×3
IV NS 500ML BAXH (IV SOLUTION) ×1 IMPLANT
LABEL OR SOLS (LABEL) ×3 IMPLANT
LOOP RED MAXI  1X406MM (MISCELLANEOUS) ×2
LOOP VESSEL MAXI 1X406 RED (MISCELLANEOUS) ×2 IMPLANT
LOOP VESSEL MINI 0.8X406 BLUE (MISCELLANEOUS) ×1 IMPLANT
LOOPS BLUE MINI 0.8X406MM (MISCELLANEOUS) ×2
NDL FILTER BLUNT 18X1 1/2 (NEEDLE) ×1 IMPLANT
NDL HYPO 25X1 1.5 SAFETY (NEEDLE) IMPLANT
NEEDLE FILTER BLUNT 18X 1/2SAF (NEEDLE) ×2
NEEDLE FILTER BLUNT 18X1 1/2 (NEEDLE) ×1 IMPLANT
NEEDLE HYPO 25X1 1.5 SAFETY (NEEDLE) IMPLANT
NS IRRIG 500ML POUR BTL (IV SOLUTION) ×3 IMPLANT
PACK BASIN MAJOR ARMC (MISCELLANEOUS) ×3 IMPLANT
PAD ABD DERMACEA PRESS 5X9 (GAUZE/BANDAGES/DRESSINGS) ×8 IMPLANT
PAD PREP 24X41 OB/GYN DISP (PERSONAL CARE ITEMS) ×3 IMPLANT
SOLUTION CELL SAVER (CLIP) ×1 IMPLANT
SUT ETHILON 3-0 FS-10 30 BLK (SUTURE) ×6
SUT GORETEX CV-6TTC-13 36IN (SUTURE) IMPLANT
SUT GTX CV-5 TTC13 DBL (SUTURE) IMPLANT
SUT MNCRL AB 4-0 PS2 18 (SUTURE) ×1 IMPLANT
SUT PROLENE 6 0 BV (SUTURE) ×4 IMPLANT
SUT SILK 2 0 (SUTURE) ×3
SUT SILK 2 0 SH (SUTURE) ×3 IMPLANT
SUT SILK 2-0 18XBRD TIE 12 (SUTURE) ×1 IMPLANT
SUT SILK 3 0 (SUTURE) ×3
SUT SILK 3-0 18XBRD TIE 12 (SUTURE) ×1 IMPLANT
SUT SILK 4 0 (SUTURE) ×3
SUT SILK 4-0 18XBRD TIE 12 (SUTURE) ×1 IMPLANT
SUT VIC AB 2-0 CT1 (SUTURE) ×2 IMPLANT
SUT VICRYL+ 3-0 36IN CT-1 (SUTURE) ×6 IMPLANT
SUTURE EHLN 3-0 FS-10 30 BLK (SUTURE) IMPLANT
SYR 20CC LL (SYRINGE) ×3 IMPLANT
SYR 3ML LL SCALE MARK (SYRINGE) ×3 IMPLANT

## 2018-11-05 NOTE — H&P (Signed)
[facility] History & Physical Update  The patient was interviewed and re-examined.  The patient's previous History and Physical has been reviewed and he now has some wounds with drainage overlying his left femoral AV graft with concern for infection.  This should not be thrombectomized today.  I believe would be in his best interest to excise the graft as much as possible to remove the infection and help heal the wounds.  We would likely then have to consider a new graft placement in the right leg at some point going forward.  His procedure today has been changed from thrombectomy of the graft to I&D with graft excision of the left thigh.  Ricky Pain, MD  11/05/2018, 8:55 AM

## 2018-11-05 NOTE — Anesthesia Procedure Notes (Signed)
Performed by: Jonna Clark, CRNA

## 2018-11-05 NOTE — Anesthesia Preprocedure Evaluation (Signed)
Anesthesia Evaluation  Patient identified by MRN, date of birth, ID band Patient awake    Reviewed: Allergy & Precautions, NPO status , Patient's Chart, lab work & pertinent test results  History of Anesthesia Complications Negative for: history of anesthetic complications  Airway Mallampati: III  TM Distance: >3 FB Neck ROM: Full    Dental  (+) Poor Dentition, Missing   Pulmonary neg sleep apnea, neg COPD, Current Smoker,    breath sounds clear to auscultation- rhonchi (-) wheezing      Cardiovascular + Peripheral Vascular Disease and +CHF  (-) CAD, (-) Past MI, (-) Cardiac Stents and (-) CABG  Rhythm:Regular Rate:Normal - Systolic murmurs and - Diastolic murmurs Echo 01/17/53: - Left ventricle: Systolic function was mildly to moderately   reduced. The estimated ejection fraction was in the range of 40%   to 45%. Hypokinesis of the anteroseptal myocardium. Hypokinesis   of the lateral myocardium. - Mitral valve: There was moderate regurgitation, with multiple   jets. - Left atrium: The atrium was moderately dilated. No evidence of   thrombus in the atrial cavity or appendage. - Right atrium: The atrium was mildly to moderately dilated. No   evidence of thrombus in the atrial cavity or appendage; the   abnormality has resolved sincethe study of February 2017. There   was mild spontaneous echo contrast ("smoke"). - Atrial septum: The septum bowed from left to right, consistent   with increased left atrial pressure. No defect or patent foramen   ovale was identified. Agitated saline contrast study showed no   shunt. - Tricuspid valve: There was moderate regurgitation directed   eccentrically and toward the septum. - Superior vena cava: The study excluded a thrombus.   Neuro/Psych neg Seizures negative neurological ROS  negative psych ROS   GI/Hepatic Neg liver ROS, GERD  ,  Endo/Other  diabetes, Insulin Dependent   Renal/GU ESRF and DialysisRenal disease     Musculoskeletal negative musculoskeletal ROS (+)   Abdominal (+) - obese,   Peds  Hematology  (+) anemia ,   Anesthesia Other Findings Past Medical History: No date: Amputee, below knee, left (HCC) No date: Anemia No date: Blood transfusion No date: Bronchitis No date: Chest pain No date: CHF (congestive heart failure) (HCC) No date: Chronic kidney disease     Comment:  d[facility], [facility]-F, [facility] No date: Diabetes mellitus     Comment:  type 2 No date: Dysrhythmia     Comment:  irregular heartbeat No date: ESRD (end stage renal disease) on dialysis ([place])     Comment:  first HD 06/19/04 No date: GERD (gastroesophageal reflux disease) No date: Hyperparathyroidism ([place]) No date: Morbid obesity ([place])   Reproductive/Obstetrics                             Anesthesia Physical  Anesthesia Plan  ASA: IV  Anesthesia Plan: General   Post-op Pain Management:    Induction: Intravenous  PONV Risk Score and Plan: 0 and Ondansetron  Airway Management Planned: LMA  Additional Equipment:   Intra-op Plan:   Post-operative Plan:   Informed Consent: I have reviewed the patients History and Physical, chart, labs and discussed the procedure including the risks, benefits and alternatives for the proposed anesthesia with the patient or authorized representative who has indicated his/her understanding and acceptance.     Dental advisory given  Plan Discussed with: CRNA and Anesthesiologist  Anesthesia Plan Comments:  Anesthesia Quick Evaluation  

## 2018-11-05 NOTE — Transfer of Care (Signed)
Immediate Anesthesia Transfer of Care Note  Patient: [name]  Procedure(s) Performed: I&D WITH GRAFT EXCISION (Left )  Patient Location: PACU  Anesthesia Type:General  Level of Consciousness: drowsy and patient cooperative  Airway & Oxygen Therapy: Patient Spontanous Breathing and Patient connected to face mask oxygen  Post-op Assessment: Report given to RN and Post -op Vital signs reviewed and stable  Post vital signs: Reviewed and stable  Last Vitals:  Vitals Value Taken Time  BP 94/66 11/05/2018 12:35 PM  Temp    Pulse 98 11/05/2018 12:36 PM  Resp 24 11/05/2018 12:36 PM  SpO2 96 % 11/05/2018 12:36 PM  Vitals shown include unvalidated device data.  Last Pain:  Vitals:   11/05/18 0915  PainSc: 10-Worst pain ever         Complications: No apparent anesthesia complications

## 2018-11-05 NOTE — Anesthesia Postprocedure Evaluation (Signed)
Anesthesia Post Note  Patient: [name]  Procedure(s) Performed: I&D WITH GRAFT EXCISION (Left )  Patient location during evaluation: PACU Anesthesia Type: General Level of consciousness: awake and alert Pain management: pain level controlled Vital Signs Assessment: post-procedure vital signs reviewed and stable Respiratory status: spontaneous breathing, nonlabored ventilation, respiratory function stable and patient connected to nasal cannula oxygen Cardiovascular status: blood pressure returned to baseline and stable Postop Assessment: no apparent nausea or vomiting Anesthetic complications: no     Last Vitals:  Vitals:   11/05/18 1317 11/05/18 1332  BP: 119/64 (!) 119/57  Pulse: 95 99  Resp: 11 16  Temp: (!) 36.3 C 36.5 C  SpO2: 100% 100%    Last Pain:  Vitals:   11/05/18 1332  TempSrc: Oral  PainSc: 2                  Zade Falkner S

## 2018-11-05 NOTE — Anesthesia Procedure Notes (Signed)
Procedure Name: LMA Insertion Date/Time: 11/05/2018 11:24 AM Performed by: Jonna Clark, CRNA Pre-anesthesia Checklist: Patient identified, Patient being monitored, Timeout performed, Emergency Drugs available and Suction available Patient Re-evaluated:Patient Re-evaluated prior to induction Oxygen Delivery Method: Circle system utilized Preoxygenation: Pre-oxygenation with 100% oxygen Induction Type: IV induction Ventilation: Mask ventilation without difficulty LMA: LMA inserted LMA Size: 4.5 Tube type: Oral Number of attempts: 1 Placement Confirmation: positive ETCO2 and breath sounds checked- equal and bilateral Tube secured with: Tape Dental Injury: Teeth and Oropharynx as per pre-operative assessment

## 2018-11-05 NOTE — Anesthesia Post-op Follow-up Note (Signed)
Anesthesia QCDR form completed.        

## 2018-11-05 NOTE — Op Note (Signed)
[facility]   OPERATIVE NOTE  DATE: 11/05/2018  PRE-OPERATIVE DIAGNOSIS: Thrombosed left thigh AV graft with purulent drainage concerning for infection  POST-OPERATIVE DIAGNOSIS: same as above  PROCEDURE: 1.   Incision and drainage of left thigh wounds, incision and resection of left thigh AV graft.  SURGEON: Leotis Pain  ASSISTANT(S): Hezzie Bump, PA-C  ANESTHESIA: general  ESTIMATED BLOOD LOSS: 15 cc  FINDING(S): 1.  Graft was reasonably well incorporated.  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  Several of these were sent as specimen as was the graft.  A culture was also sent of some of the fluid.   INDICATIONS:   [name] is a 53 y.o. male who presents with draining pustules overlying his left thigh AV graft.  He was originally planned for thrombectomy, but significant concern for infection was present and I did not feel like that would be a safe option.  Excision of at least a portion of the graft with debridement and cultures were planned. An assistant was present during the procedure to help facilitate the exposure and expedite the procedure.  Risks and benefits were discussed and informed consent was obtained.  DESCRIPTION: After obtaining full informed written consent, the patient was brought back to the operating room and placed supine upon the operating table.  The patient was prepped and draped in the standard fashion. The assistant provided retraction and mobilization to help facilitate exposure and expedite the procedure throughout the entire procedure.  This included following suture, using retractors, and optimizing lighting. I began by making an incision over the lateral portion of the graft where the pustules and drainage was seen.  A culture was sent of some of the fluid from the pustules.  On entering the graft was dissected out. Graft was reasonably well incorporated.   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  Several of these were sent as specimen as was the graft.  A second incision was made on the medial portion and the graft was dissected out.  There was less fluid and perigraft fibrosis on the medial aspect and there was on the lateral aspect.  It was then divided and ligated with 2-0 silk suture ligatures both proximally and distally and then the remaining portion of the graft was then removed after dissecting this out from each incision.  There was no remaining undrained purulence or fluid.  There was no clear abscess.  I then loosely closed the wound with several interrupted 3-0 Vicryl's in each incision.  A wick of iodoform gauze was placed under the wound both medially and laterally and tunneled through the wound and brought out on either end.  This would be removed in the office in 4 to 5 days.  The skin was then closed with a series of interrupted nylon sutures. At this point, we elected to complete the procedure.  The patient was taken to the recovery room in stable condition.   COMPLICATIONS: None  CONDITION: Stable  Leotis Pain  11/05/2018, 12:17 PM    This note was created with Dragon Medical transcription system. Any errors in dictation are purely unintentional.

## 2018-11-05 NOTE — Discharge Instructions (Signed)
AMBULATORY SURGERY  DISCHARGE INSTRUCTIONS   1) The drugs that you were given will stay in your system until tomorrow so for the next 24 hours you should not:  A) Drive an automobile B) Make any legal decisions C) Drink any alcoholic beverage   2) You may resume regular meals tomorrow.  Today it is better to start with liquids and gradually work up to solid foods.  You may eat anything you prefer, but it is better to start with liquids, then soup and crackers, and gradually work up to solid foods.   3) Please notify your doctor immediately if you have any unusual bleeding, trouble breathing, redness and pain at the surgery site, drainage, fever, or pain not relieved by medication.    4) Additional Instructions:    Please contact your physician with any problems or Same Day Surgery at [phone], Monday through Friday 6 am to 4 pm, or [place] at [facility] number at [phone].#[phone]

## 2018-11-09 ENCOUNTER — Other Ambulatory Visit (INDEPENDENT_AMBULATORY_CARE_PROVIDER_SITE_OTHER): Payer: Self-pay | Admitting: Vascular Surgery

## 2018-11-09 ENCOUNTER — Ambulatory Visit (INDEPENDENT_AMBULATORY_CARE_PROVIDER_SITE_OTHER): Payer: Medicare Other | Admitting: Nurse Practitioner

## 2018-11-09 ENCOUNTER — Encounter (INDEPENDENT_AMBULATORY_CARE_PROVIDER_SITE_OTHER): Payer: Self-pay | Admitting: Nurse Practitioner

## 2018-11-09 ENCOUNTER — Other Ambulatory Visit: Payer: Self-pay

## 2018-11-09 VITALS — BP 142/82 | HR 99 | Resp 16 | Ht 72.0 in | Wt 226.0 lb

## 2018-11-09 DIAGNOSIS — E1165 Type 2 diabetes mellitus with hyperglycemia: Secondary | ICD-10-CM

## 2018-11-09 DIAGNOSIS — K219 Gastro-esophageal reflux disease without esophagitis: Secondary | ICD-10-CM

## 2018-11-09 DIAGNOSIS — Z79899 Other long term (current) drug therapy: Secondary | ICD-10-CM

## 2018-11-09 DIAGNOSIS — T827XXA Infection and inflammatory reaction due to other cardiac and vascular devices, implants and grafts, initial encounter: Secondary | ICD-10-CM

## 2018-11-09 LAB — SURGICAL PATHOLOGY

## 2018-11-09 NOTE — Progress Notes (Signed)
Pharmacy - antimicrobial stewardship  Patient with thrombosed AV graft and concern for infection.  He is s/p I&D and of thigh and resection of L thigh AVG.  He was sent home on amox/clav.    L thigh perigraft cx grew MRSA  Notified Dr Lucky Cowboy and Marcelle Overlie, discussed PO antibiotic options.  The plan is to call in new script for doxycycline.  Doreene Eland, PharmD, BCPS.   Work Cell: [phone] 11/09/2018 10:12 AM

## 2018-11-09 NOTE — Progress Notes (Signed)
SUBJECTIVE:  Patient ID: [name], male    DOB: 1966/05/08, 53 y.o.   MRN: [id number] Chief Complaint  Patient presents with  . Follow-up    [facility] remove packing    HPI  [name] is a 53 y.o. male that returns today for removal of the packing from his left thigh.  The patient underwent an I&D with graft excision on 11/05/2018.  He had iodoform packed within the wound.  Since this time the dressing is saturated and foul-smelling.  He denies any fevers, chills, nausea, vomiting or diarrhea.  He denies any chest pain or shortness of breath.  His cultures of his wound reveal that his current antibiotic is not adequate for current infection.  We will replace with doxycycline twice daily.  Past Medical History:  Diagnosis Date  . Amputee, below knee, left ([place])   . Anemia   . Blood transfusion   . Bronchitis   . Chest pain   . CHF (congestive heart failure) ([place])   . Chronic kidney disease    dialysis, M[facility]W-F, [facility]  . Diabetes mellitus    type 2  . Dysrhythmia    irregular heartbeat  . ESRD (end stage renal disease) on dialysis ([place])    first HD 06/19/04  . GERD (gastroesophageal reflux disease)   . Hyperparathyroidism ([place])   . Morbid obesity ([place])     Past Surgical History:  Procedure Laterality Date  . A/V SHUNTOGRAM Left 09/30/2018   Procedure: A/V SHUNTOGRAM;  Surgeon: Algernon Huxley, MD;  Location: [place] [facility];  Service: Cardiovascular;  Laterality: Left;  . AMPUTATION Left 01/20/2015   Procedure: Left Great Toe Amputation;  Surgeon: Newt Minion, MD;  Location: [place];  Service: Orthopedics;  Laterality: Left;  . AMPUTATION Left 03/07/2015   Procedure: Left Foot 1st and 2nd Ray Amputation;  Surgeon: Newt Minion, MD;  Location: [place];  Service: Orthopedics;  Laterality: Left;  . AMPUTATION Left 04/14/2015   Procedure: LEFT BELOW THE KNEE AMPUTATION ;  Surgeon: Newt Minion, MD;  Location: [place];  Service: Orthopedics;  Laterality: Left;  . ANKLE  FUSION     rt foot charcot joint 2008 with refusion  . AV FISTULA PLACEMENT  10/11/2010  . AV FISTULA PLACEMENT  07/02/2011   Procedure: INSERTION OF ARTERIOVENOUS (AV) GORE-TEX GRAFT ARM;  Surgeon: Elam Dutch, MD;  Location: [place];  Service: Vascular;  Laterality: Left;  . AV FISTULA PLACEMENT Right 11/06/2012   Procedure: INSERTION OF ARTERIOVENOUS (AV) GORE-TEX GRAFT ARM;  Surgeon: Mal Misty, MD;  Location: [place];  Service: Vascular;  Laterality: Right;  . AV FISTULA PLACEMENT Right 03/21/2016   Procedure: INSERTION OF ARTERIOVENOUS (AV) GORE-TEX GRAFT ARM ( LOOP FOREARM );  Surgeon: Algernon Huxley, MD;  Location: [facility];  Service: Vascular;  Laterality: Right;  . AV FISTULA PLACEMENT Left 11/07/2016   Procedure: INSERTION OF ARTERIOVENOUS (AV) Artegraft;  Surgeon: Algernon Huxley, MD;  Location: [facility];  Service: Vascular;  Laterality: Left;  . AV FISTULA PLACEMENT Left 10/01/2018   Procedure: INSERTION OF ARTERIOVENOUS (AV) GORE-TEX GRAFT THIGH REVISION;  Surgeon: Algernon Huxley, MD;  Location: [facility];  Service: Vascular;  Laterality: Left;  . [place] REMOVAL Right 11/06/2012   Procedure: REMOVAL OF ARTERIOVENOUS GORETEX GRAFT ([place]);  Surgeon: Mal Misty, MD;  Location: [place];  Service: Vascular;  Laterality: Right;  . DIALYSIS/PERMA CATHETER INSERTION N/A 10/05/2018   Procedure: DIALYSIS/PERMA CATHETER  INSERTION;  Surgeon: Algernon Huxley, MD;  Location: [place] [facility];  Service: Cardiovascular;  Laterality: N/A;  . ENDARTERECTOMY FEMORAL  11/07/2016   Procedure: ENDARTERECTOMY FEMORAL;  Surgeon: Algernon Huxley, MD;  Location: [facility];  Service: Vascular;;  . HERNIA REPAIR     Umbilical Hernia  . INSERTION OF DIALYSIS CATHETER  01/20/2012   Procedure: INSERTION OF DIALYSIS CATHETER;  Surgeon: Serafina Mitchell, MD;  Location: [place];  Service: Vascular;  Laterality: N/A;  insertion left internal jugular dialysis catheter 23cm  . INSERTION OF DIALYSIS CATHETER Right 11/09/2012   Procedure:  INSERTION OF DIALYSIS CATHETER;  Surgeon: Conrad Augusta, MD;  Location: [place];  Service: Vascular;  Laterality: Right;  Right  Femoral Vein  . INSERTION OF DIALYSIS CATHETER N/A 08/18/2015   Procedure: INSERTION OF DIALYSIS CATHETER RIGHT FEMORAL VEIN;  Surgeon: Mal Misty, MD;  Location: [place];  Service: Cardiovascular;  Laterality: N/A;  . PARATHYROIDECTOMY    . PERIPHERAL VASCULAR CATHETERIZATION Right 04/04/2016   Procedure: A/V Shuntogram/Fistulagram;  Surgeon: Algernon Huxley, MD;  Location: [place] [facility];  Service: Cardiovascular;  Laterality: Right;  . REMOVAL OF A HERO DEVICE Left 08/14/2015   Procedure: REMOVAL OF LEFT ARM HERO DEVICE;  Surgeon: Rosetta Posner, MD;  Location: [place];  Service: Vascular;  Laterality: Left;  . REMOVAL OF GRAFT Left 11/05/2018   Procedure: I&D WITH GRAFT EXCISION;  Surgeon: Algernon Huxley, MD;  Location: [facility];  Service: Vascular;  Laterality: Left;  . TEE WITHOUT CARDIOVERSION N/A 08/18/2015   Procedure: TRANSESOPHAGEAL ECHOCARDIOGRAM (TEE);  Surgeon: Mal Misty, MD;  Location: [place];  Service: Cardiovascular;  Laterality: N/A;  . TEE WITHOUT CARDIOVERSION N/A 12/21/2015   Procedure: TRANSESOPHAGEAL ECHOCARDIOGRAM (TEE);  Surgeon: Dixie Dials, MD;  Location: [place];  Service: Cardiovascular;  Laterality: N/A;  . TEMPORARY DIALYSIS CATHETER N/A 09/29/2018   Procedure: TEMPORARY DIALYSIS CATHETER INSERTION;  Surgeon: Katha Cabal, MD;  Location: [place] [facility];  Service: Cardiovascular;  Laterality: N/A;  . THROMBECTOMY    . Umbilcial Hernia    . UPPER EXTREMITY VENOGRAPHY Left 10/31/2016   Procedure: Upper Extremity Venography;  Surgeon: Algernon Huxley, MD;  Location: [place] [facility];  Service: Cardiovascular;  Laterality: Left;  Marland Kitchen VENOGRAM N/A 01/06/2014   Procedure: VENOGRAM;  Surgeon: Conrad [place]ville, MD;  [facility]: [facility];  Service: Cardiovascular;  Laterality: N/A;    Social History   Socioeconomic History  . Marital status:  Married    Spouse name: Not on file  . Number of children: Not on file  . Years of education: Not on file  . Highest education level: Not on file  Occupational History  . Not on file  Social Needs  . Financial resource strain: Not on file  . Food insecurity:    Worry: Not on file    Inability: Not on file  . Transportation needs:    Medical: Not on file    Non-medical: Not on file  Tobacco Use  . Smoking status: Current Every Day Smoker    Years: 20.00    Types: Cigarettes  . Smokeless tobacco: Never Used  Substance and Sexual Activity  . Alcohol use: No    Alcohol/week: 0.0 standard drinks  . Drug use: Yes    Comment: oxycodone prescribed  . Sexual activity: Not Currently  Lifestyle  . Physical activity:    Days per week: Not on file  Minutes per session: Not on file  . Stress: Not on file  Relationships  . Social connections:    Talks on phone: Not on file    Gets together: Not on file    Attends religious service: Not on file    Active member of club or organization: Not on file    Attends meetings of clubs or organizations: Not on file    Relationship status: Not on file  . Intimate partner violence:    Fear of current or ex partner: Not on file    Emotionally abused: Not on file    Physically abused: Not on file    Forced sexual activity: Not on file  Other Topics Concern  . Not on file  Social History Narrative  . Not on file    Family History  Problem Relation Age of Onset  . Heart disease Mother   . Kidney disease Mother   . Hypertension Mother     Allergies  Allergen Reactions  . Hydrocodone Nausea And Vomiting  . Dilaudid [Hydromorphone Hcl] Itching  . Fentanyl Itching  . Hydrocodone-Acetaminophen Nausea And Vomiting     Review of Systems   Review of Systems: Negative Unless Checked Constitutional: [] Weight loss  [] Fever  [] Chills Cardiac: [] Chest pain   []  Atrial Fibrillation  [] Palpitations   [] Shortness of breath when laying flat    [] Shortness of breath with exertion. [] Shortness of breath at rest Vascular:  [] Pain in legs with walking   [] Pain in legs with standing [] Pain in legs when laying flat   [] Claudication    [] Pain in feet when laying flat    [] History of DVT   [] Phlebitis   [] Swelling in legs   [] Varicose veins   [] Non-healing ulcers Pulmonary:   [] Uses home oxygen   [] Productive cough   [] Hemoptysis   [] Wheeze  [] COPD   [] Asthma Neurologic:  [] Dizziness   [] Seizures  [] Blackouts [] History of stroke   [] History of TIA  [] Aphasia   [] Temporary Blindness   [] Weakness or numbness in arm   [x] Weakness or numbness in leg Musculoskeletal:   [] Joint swelling   [] Joint pain   [] Low back pain  []  History of Knee Replacement [] Arthritis [] back Surgeries  []  Spinal Stenosis    Hematologic:  [] Easy bruising  [] Easy bleeding   [] Hypercoagulable state   [x] Anemic Gastrointestinal:  [] Diarrhea   [] Vomiting  [x] Gastroesophageal reflux/heartburn   [] Difficulty swallowing. [] Abdominal pain Genitourinary:  [x] Chronic kidney disease   [] Difficult urination  [] Anuric   [] Blood in urine [] Frequent urination  [] Burning with urination   [] Hematuria Skin:  [] Rashes   [] Ulcers [x] Wounds Psychological:  [] History of anxiety   []  History of major depression  []  Memory Difficulties      OBJECTIVE:   Physical Exam  BP (!) 142/82 (BP Location: Left Arm)   Pulse 99   Resp 16   Ht 6' (1.829 m)   Wt 226 lb (102.5 kg)   BMI 30.65 kg/m   Gen: WD/WN, NAD Head: [place]/AT, No temporalis wasting.  Ear/Nose/Throat: Hearing grossly intact, nares w/o erythema or drainage Eyes: PER, EOMI, sclera nonicteric.  Neck: Supple, no masses.  No JVD.  Pulmonary:  Good air movement, no use of accessory muscles.  Cardiac: RRR Vascular:  Large U-shaped wound on left thigh.  Purulent drainage.  Suture still in place.  Area of fibrinous exudate near proximal portion. Vessel Right Left  Radial Palpable Palpable   Gastrointestinal: soft, non-distended. No  guarding/no peritoneal signs.  Musculoskeletal: M/S 5/5  throughout.  No deformity or atrophy.  Neurologic: Pain and light touch intact in extremities.  Symmetrical.  Speech is fluent. Motor exam as listed above. Psychiatric: Judgment intact, Mood & affect appropriate for pt's clinical situation. Dermatologic:  See above wound description. No changes consistent with cellulitis. Lymph : No Cervical lymphadenopathy, no lichenification or skin changes of chronic lymphedema.       ASSESSMENT AND PLAN:  1. Infection of AV graft for dialysis [facility]) Today we altered the patient's antibiotic due to wound cultures.  He was instructed to stop his Augmentin and to begin doxycycline tomorrow.  We also cleaned and redressed the wound.  His sutures will remain in place and he will return in 4 weeks.  The patient currently has wound care home health services for a different wound.  We will reach out to advanced home care tomorrow in order to give them instructions for wound care with this current wound.  2. Uncontrolled type 2 diabetes mellitus with hyperglycemia ([place]) Continue hypoglycemic medications as already ordered, these medications have been reviewed and there are no changes at this time.  Hgb A1C to be monitored as already arranged by primary service   3. Gastroesophageal reflux disease, esophagitis presence not specified Continue PPI as already ordered, this medication has been reviewed and there are no changes at this time.  Avoidence of caffeine and alcohol  Moderate elevation of the head of the bed    Current Outpatient Medications on File Prior to Visit  Medication Sig Dispense Refill  . acetaminophen (TYLENOL) 500 MG tablet Take 1,000 mg by mouth as needed for moderate pain (takes everytime with dialysis).    Marland Kitchen amoxicillin-clavulanate (AUGMENTIN) 875-125 MG tablet Take 1 tablet by mouth 2 (two) times daily for 10 days. 20 tablet 0  . aspirin EC 81 MG EC tablet Take 1 tablet (81 mg  total) by mouth daily. 30 tablet 11  . AURYXIA 1 GM 210 MG(Fe) tablet Take 1 tablet by mouth 3 (three) times daily with meals.    Marland Kitchen b complex-vitamin c-folic acid (NEPHRO-VITE) 0.8 MG TABS tablet Take 1 tablet by mouth at bedtime. 30 tablet 0  . cyclobenzaprine (FLEXERIL) 5 MG tablet Take 5 mg by mouth as needed for muscle spasms.    . Dulaglutide (TRULICITY) 4.27 CW/2.3JS SOPN Inject 0.75 mg into the skin every Friday.     Marland Kitchen ibuprofen (ADVIL,MOTRIN) 200 MG tablet Take 400 mg by mouth every 6 (six) hours as needed for moderate pain.    Marland Kitchen insulin detemir (LEVEMIR) 100 UNIT/ML injection Inject 0.2 mLs (20 Units total) into the skin at bedtime. (Patient taking differently: Inject 4-20 Units into the skin at bedtime. Per sliding scale) 10 mL 11  . metroNIDAZOLE (FLAGYL) 500 MG tablet nursing to crush Flagyl daily and put on open wound of the thigh. 30 tablet 0  . NOVOLOG FLEXPEN 100 UNIT/ML FlexPen 2-3 Units as needed for high blood sugar. Per sliding scale    . oxyCODONE-acetaminophen (PERCOCET) 7.5-325 MG tablet Take 1 tablet by mouth every 4 (four) hours as needed for severe pain. 30 tablet 0  . oxyCODONE-acetaminophen (PERCOCET/ROXICET) 5-325 MG tablet Take 2 tablets by mouth every 6 (six) hours as needed for up to 7 days for severe pain (leg pain). 30 tablet 0  . pantoprazole (PROTONIX) 40 MG tablet Take 40 mg by mouth daily.    . VENTOLIN HFA 108 (90 Base) MCG/ACT inhaler Inhale 2 puffs into the lungs as needed for wheezing.  Current Facility-Administered Medications on File Prior to Visit  Medication Dose Route Frequency Provider Last Rate Last Dose  . Chlorhexidine Gluconate Cloth 2 % PADS 6 each  6 each Topical Once Stegmayer, Kimberly A, PA-C       And  . Chlorhexidine Gluconate Cloth 2 % PADS 6 each  6 each Topical Once Stegmayer, Kimberly A, PA-C        There are no Patient Instructions on file for this visit. No follow-ups on file.   Kris Hartmann, NP  This note was completed  with Sales executive.  Any errors are purely unintentional.

## 2018-11-10 LAB — AEROBIC/ANAEROBIC CULTURE W GRAM STAIN (SURGICAL/DEEP WOUND)

## 2018-11-10 LAB — AEROBIC/ANAEROBIC CULTURE (SURGICAL/DEEP WOUND)

## 2018-12-08 ENCOUNTER — Ambulatory Visit (INDEPENDENT_AMBULATORY_CARE_PROVIDER_SITE_OTHER): Payer: Medicare Other | Admitting: Nurse Practitioner

## 2018-12-10 ENCOUNTER — Telehealth (INDEPENDENT_AMBULATORY_CARE_PROVIDER_SITE_OTHER): Payer: Self-pay | Admitting: Nurse Practitioner

## 2018-12-10 NOTE — Telephone Encounter (Signed)
Please advise if patient needs apt for follow up on infection or what to do for patient. He no showed apt on 12/08/18. AS, CMA

## 2018-12-10 NOTE — Telephone Encounter (Signed)
Patient denies any odor or hot to touch but says he does have pus drainage.  Can you please call and schedule this patient an apt to be seen per [facility]. AS, CMA

## 2018-12-10 NOTE — Telephone Encounter (Signed)
Yes we should see him to see how his wound site is healing.  If he has signs of infection, such as foul smelling drainage, pus, it is hot to the touch, we should see him soon but if not, we can see him in 4 weeks to evaluate the wound.

## 2019-01-07 ENCOUNTER — Ambulatory Visit (INDEPENDENT_AMBULATORY_CARE_PROVIDER_SITE_OTHER): Payer: Medicare Other | Admitting: Nurse Practitioner

## 2019-01-12 ENCOUNTER — Other Ambulatory Visit: Payer: Self-pay

## 2019-01-12 ENCOUNTER — Encounter (INDEPENDENT_AMBULATORY_CARE_PROVIDER_SITE_OTHER): Payer: Self-pay | Admitting: Nurse Practitioner

## 2019-01-12 ENCOUNTER — Ambulatory Visit (INDEPENDENT_AMBULATORY_CARE_PROVIDER_SITE_OTHER): Payer: Medicare Other | Admitting: Nurse Practitioner

## 2019-01-12 VITALS — BP 125/76 | HR 99 | Resp 16 | Ht 72.0 in | Wt 211.0 lb

## 2019-01-12 DIAGNOSIS — K219 Gastro-esophageal reflux disease without esophagitis: Secondary | ICD-10-CM | POA: Diagnosis not present

## 2019-01-12 DIAGNOSIS — N186 End stage renal disease: Secondary | ICD-10-CM

## 2019-01-12 DIAGNOSIS — E1165 Type 2 diabetes mellitus with hyperglycemia: Secondary | ICD-10-CM | POA: Diagnosis not present

## 2019-01-12 DIAGNOSIS — Z79899 Other long term (current) drug therapy: Secondary | ICD-10-CM

## 2019-01-12 DIAGNOSIS — Z992 Dependence on renal dialysis: Secondary | ICD-10-CM

## 2019-01-12 DIAGNOSIS — Z794 Long term (current) use of insulin: Secondary | ICD-10-CM

## 2019-01-12 DIAGNOSIS — F17210 Nicotine dependence, cigarettes, uncomplicated: Secondary | ICD-10-CM

## 2019-01-17 ENCOUNTER — Encounter (INDEPENDENT_AMBULATORY_CARE_PROVIDER_SITE_OTHER): Payer: Self-pay | Admitting: Nurse Practitioner

## 2019-01-17 NOTE — Progress Notes (Signed)
SUBJECTIVE:  Patient ID: [name], male    DOB: 1966-01-28, 53 y.o.   MRN: [id number] Chief Complaint  Patient presents with  . Follow-up     wound check    HPI  [name] is a 52 y.o. male that presents today for wound evaluation and discussion on new access.  His left graft site removal is completely healed.  He is maintained by right thigh IJ permcath.  He denies current issue with it.  He denies any other fever, nausea, vomiting or diarrhea.  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    Past Medical History:  Diagnosis Date  . Amputee, below knee, left ([place])   . Anemia   . Blood transfusion   . Bronchitis   . Chest pain   . CHF (congestive heart failure) ([place])   . Chronic kidney disease    d[facility], [facility]F, [facility]  . Diabetes mellitus    type 2  . Dysrhythmia    irregular heartbeat  . ESRD (end stage renal disease) on dialysis ([place])    first HD 06/19/04  . GERD (gastroesophageal reflux disease)   . Hyperparathyroidism ([place])   . Morbid obesity ([place])     Past Surgical History:  Procedure Laterality Date  . A/V SHUNTOGRAM Left 09/30/2018   Procedure: A/V SHUNTOGRAM;  Surgeon: Algernon Huxley, MD;  Location: [place] [facility];  Service: Cardiovascular;  Laterality: Left;  . AMPUTATION Left 01/20/2015   Procedure: Left Great Toe Amputation;  Surgeon: Newt Minion, MD;  Location: [place];  Service: Orthopedics;  Laterality: Left;  . AMPUTATION Left 03/07/2015   Procedure: Left Foot 1st and 2nd Ray Amputation;  Surgeon: Newt Minion, MD;  Location: [place];  Service: Orthopedics;  Laterality: Left;  . AMPUTATION Left 04/14/2015   Procedure: LEFT BELOW THE KNEE AMPUTATION ;  Surgeon: Newt Minion, MD;  Location: [place];  Service: Orthopedics;  Laterality: Left;  . ANKLE FUSION     rt foot charcot joint 2008 with refusion  . AV FISTULA PLACEMENT  10/11/2010  . AV FISTULA PLACEMENT  07/02/2011   Procedure:  INSERTION OF ARTERIOVENOUS (AV) GORE-TEX GRAFT ARM;  Surgeon: Elam Dutch, MD;  Location: [place];  Service: Vascular;  Laterality: Left;  . AV FISTULA PLACEMENT Right 11/06/2012   Procedure: INSERTION OF ARTERIOVENOUS (AV) GORE-TEX GRAFT ARM;  Surgeon: Mal Misty, MD;  Location: [place];  Service: Vascular;  Laterality: Right;  . AV FISTULA PLACEMENT Right 03/21/2016   Procedure: INSERTION OF ARTERIOVENOUS (AV) GORE-TEX GRAFT ARM ( LOOP FOREARM );  Surgeon: Algernon Huxley, MD;  Location: [facility];  Service: Vascular;  Laterality: Right;  . AV FISTULA PLACEMENT Left 11/07/2016   Procedure: INSERTION OF ARTERIOVENOUS (AV) Artegraft;  Surgeon: Algernon Huxley, MD;  Location: [facility];  Service: Vascular;  Laterality: Left;  . AV FISTULA PLACEMENT Left 10/01/2018   Procedure: INSERTION OF ARTERIOVENOUS (AV) GORE-TEX GRAFT THIGH REVISION;  Surgeon: Algernon Huxley, MD;  Location: [facility];  Service: Vascular;  Laterality: Left;  . [place] REMOVAL Right 11/06/2012   Procedure: REMOVAL OF ARTERIOVENOUS GORETEX GRAFT ([place]);  Surgeon: Mal Misty, MD;  Location: [place];  Service: Vascular;  Laterality: Right;  . DIALYSIS/PERMA CATHETER INSERTION N/A 10/05/2018   Procedure: DIALYSIS/PERMA CATHETER INSERTION;  Surgeon: Algernon Huxley, MD;  Location: [place] [facility];  Service: Cardiovascular;  Laterality: N/A;  .  ENDARTERECTOMY FEMORAL  11/07/2016   Procedure: ENDARTERECTOMY FEMORAL;  Surgeon: Algernon Huxley, MD;  Location: [facility];  Service: Vascular;;  . HERNIA REPAIR     Umbilical Hernia  . INSERTION OF DIALYSIS CATHETER  01/20/2012   Procedure: INSERTION OF DIALYSIS CATHETER;  Surgeon: Serafina Mitchell, MD;  Location: [place];  Service: Vascular;  Laterality: N/A;  insertion left internal jugular dialysis catheter 23cm  . INSERTION OF DIALYSIS CATHETER Right 11/09/2012   Procedure: INSERTION OF DIALYSIS CATHETER;  Surgeon: Conrad [place], MD;  Location: [place];  Service: Vascular;  Laterality: Right;  Right  Femoral Vein   . INSERTION OF DIALYSIS CATHETER N/A 08/18/2015   Procedure: INSERTION OF DIALYSIS CATHETER RIGHT FEMORAL VEIN;  Surgeon: Mal Misty, MD;  Location: [place];  Service: Cardiovascular;  Laterality: N/A;  . PARATHYROIDECTOMY    . PERIPHERAL VASCULAR CATHETERIZATION Right 04/04/2016   Procedure: A/V Shuntogram/Fistulagram;  Surgeon: Algernon Huxley, MD;  Location: [place] [facility];  Service: Cardiovascular;  Laterality: Right;  . REMOVAL OF A HERO DEVICE Left 08/14/2015   Procedure: REMOVAL OF LEFT ARM HERO DEVICE;  Surgeon: Rosetta Posner, MD;  Location: [place];  Service: Vascular;  Laterality: Left;  . REMOVAL OF GRAFT Left 11/05/2018   Procedure: I&D WITH GRAFT EXCISION;  Surgeon: Algernon Huxley, MD;  Location: [facility];  Service: Vascular;  Laterality: Left;  . TEE WITHOUT CARDIOVERSION N/A 08/18/2015   Procedure: TRANSESOPHAGEAL ECHOCARDIOGRAM (TEE);  Surgeon: Mal Misty, MD;  Location: [place];  Service: Cardiovascular;  Laterality: N/A;  . TEE WITHOUT CARDIOVERSION N/A 12/21/2015   Procedure: TRANSESOPHAGEAL ECHOCARDIOGRAM (TEE);  Surgeon: Dixie Dials, MD;  Location: [place];  Service: Cardiovascular;  Laterality: N/A;  . TEMPORARY DIALYSIS CATHETER N/A 09/29/2018   Procedure: TEMPORARY DIALYSIS CATHETER INSERTION;  Surgeon: Katha Cabal, MD;  Location: [place] [facility];  Service: Cardiovascular;  Laterality: N/A;  . THROMBECTOMY    . Umbilcial Hernia    . UPPER EXTREMITY VENOGRAPHY Left 10/31/2016   Procedure: Upper Extremity Venography;  Surgeon: Algernon Huxley, MD;  Location: [place] [facility];  Service: Cardiovascular;  Laterality: Left;  Marland Kitchen VENOGRAM N/A 01/06/2014   Procedure: VENOGRAM;  Surgeon: Conrad Stevenson, MD;  Location: [facility];  Service: Cardiovascular;  Laterality: N/A;    Social History   Socioeconomic History  . Marital status: Married    Spouse name: Not on file  . Number of children: Not on file  . Years of education: Not on file  . Highest education level:  Not on file  Occupational History  . Not on file  Social Needs  . Financial resource strain: Not on file  . Food insecurity    Worry: Not on file    Inability: Not on file  . Transportation needs    Medical: Not on file    Non-medical: Not on file  Tobacco Use  . Smoking status: Current Every Day Smoker    Years: 20.00    Types: Cigarettes  . Smokeless tobacco: Never Used  Substance and Sexual Activity  . Alcohol use: No    Alcohol/week: 0.0 standard drinks  . Drug use: Yes    Comment: oxycodone prescribed  . Sexual activity: Not Currently  Lifestyle  . Physical activity    Days per week: Not on file    Minutes per session: Not on file  . Stress: Not on file  Relationships  . Social connections  Talks on phone: Not on file    Gets together: Not on file    Attends religious service: Not on file    Active member of club or organization: Not on file    Attends meetings of clubs or organizations: Not on file    Relationship status: Not on file  . Intimate partner violence    Fear of current or ex partner: Not on file    Emotionally abused: Not on file    Physically abused: Not on file    Forced sexual activity: Not on file  Other Topics Concern  . Not on file  Social History Narrative  . Not on file    Family History  Problem Relation Age of Onset  . Heart disease Mother   . Kidney disease Mother   . Hypertension Mother     Allergies  Allergen Reactions  . Hydrocodone Nausea And Vomiting  . Dilaudid [Hydromorphone Hcl] Itching  . Fentanyl Itching  . Hydrocodone-Acetaminophen Nausea And Vomiting     Review of Systems   Review of Systems: Negative Unless Checked Constitutional: [] Weight loss  [] Fever  [] Chills Cardiac: [] Chest pain   []  Atrial Fibrillation  [] Palpitations   [] Shortness of breath when laying flat   [] Shortness of breath with exertion. [] Shortness of breath at rest Vascular:  [] Pain in legs with walking   [] Pain in legs with standing  [] Pain in legs when laying flat   [] Claudication    [] Pain in feet when laying flat    [] History of DVT   [] Phlebitis   [] Swelling in legs   [] Varicose veins   [] Non-healing ulcers Pulmonary:   [] Uses home oxygen   [] Productive cough   [] Hemoptysis   [] Wheeze  [] COPD   [] Asthma Neurologic:  [] Dizziness   [] Seizures  [] Blackouts [] History of stroke   [] History of TIA  [] Aphasia   [] Temporary Blindness   [] Weakness or numbness in arm   [x] Weakness or numbness in leg Musculoskeletal:   [] Joint swelling   [] Joint pain   [] Low back pain  []  History of Knee Replacement [] Arthritis [] back Surgeries  []  Spinal Stenosis    Hematologic:  [] Easy bruising  [] Easy bleeding   [] Hypercoagulable state   [x] Anemic Gastrointestinal:  [] Diarrhea   [] Vomiting  [] Gastroesophageal reflux/heartburn   [] Difficulty swallowing. [] Abdominal pain Genitourinary:  [x] Chronic kidney disease   [] Difficult urination  [] Anuric   [] Blood in urine [] Frequent urination  [] Burning with urination   [] Hematuria Skin:  [] Rashes   [] Ulcers [x] Wounds Psychological:  [] History of anxiety   []  History of major depression  []  Memory Difficulties      OBJECTIVE:   Physical Exam  BP 125/76   Pulse 99   Resp 16   Ht 6' (1.829 m)   Wt 211 lb (95.7 kg)   BMI 28.62 kg/m   Gen: WD/WN, NAD Head: [place]/AT, No temporalis wasting.  Ear/Nose/Throat: Hearing grossly intact, nares w/o erythema or drainage Eyes: PER, EOMI, sclera nonicteric.  Neck: Supple, no masses.  No JVD.  Pulmonary:  Good air movement, no use of accessory muscles.  Cardiac: RRR Vascular:  Vessel Right Left  Radial Palpable Palpable   Gastrointestinal: soft, non-distended. No guarding/no peritoneal signs.  Musculoskeletal: M/S 5/5 throughout.  l bka Neurologic: Pain and light touch intact in extremities.  Symmetrical.  Speech is fluent. Motor exam as listed above. Psychiatric: Judgment intact, Mood & affect appropriate for pt's clinical situation. Dermatologic: No Venous  rashes. No Ulcers Noted.  No changes consistent with cellulitis. Lymph :  No Cervical lymphadenopathy, no lichenification or skin changes of chronic lymphedema.       ASSESSMENT AND PLAN:  1. ESRD on dialysis [facility]) Currently that patient has no permanent access.  He has exhausted all upper extremity options for dialysis access..  Currently, we will plan to place a right thigh graft with replacement of his permcath into is left thigh.  All risks, benefits and alternatives were discussed with the patient.  The patient agrees to proceed with surgery.    Patient will follow up in office after.    2. Uncontrolled type 2 diabetes mellitus with hyperglycemia ([place]) Continue hypoglycemic medications as already ordered, these medications have been reviewed and there are no changes at this time.  Hgb A1C to be monitored as already arranged by primary service   3. Gastroesophageal reflux disease, esophagitis presence not specified Continue PPI as already ordered, this medication has been reviewed and there are no changes at this time.  Avoidence of caffeine and alcohol  Moderate elevation of the head of the bed    Current Outpatient Medications on File Prior to Visit  Medication Sig Dispense Refill  . acetaminophen (TYLENOL) 500 MG tablet Take 1,000 mg by mouth as needed for moderate pain (takes everytime with dialysis).    Marland Kitchen aspirin EC 81 MG EC tablet Take 1 tablet (81 mg total) by mouth daily. 30 tablet 11  . AURYXIA 1 GM 210 MG(Fe) tablet Take 1 tablet by mouth 3 (three) times daily with meals.    Marland Kitchen b complex-vitamin c-folic acid (NEPHRO-VITE) 0.8 MG TABS tablet Take 1 tablet by mouth at bedtime. 30 tablet 0  . cyclobenzaprine (FLEXERIL) 5 MG tablet Take 5 mg by mouth as needed for muscle spasms.    . Dulaglutide (TRULICITY) 9.32 IZ/1.2WP SOPN Inject 0.75 mg into the skin every Friday.     . insulin detemir (LEVEMIR) 100 UNIT/ML injection Inject 0.2 mLs (20 Units total) into the skin at  bedtime. (Patient taking differently: Inject 4-20 Units into the skin at bedtime. Per sliding scale) 10 mL 11  . NOVOLOG FLEXPEN 100 UNIT/ML FlexPen 2-3 Units as needed for high blood sugar. Per sliding scale    . oxyCODONE-acetaminophen (PERCOCET) 7.5-325 MG tablet Take 1 tablet by mouth every 4 (four) hours as needed for severe pain. 30 tablet 0  . pantoprazole (PROTONIX) 40 MG tablet Take 40 mg by mouth daily.    Marland Kitchen ibuprofen (ADVIL,MOTRIN) 200 MG tablet Take 400 mg by mouth every 6 (six) hours as needed for moderate pain.    . metroNIDAZOLE (FLAGYL) 500 MG tablet nursing to crush Flagyl daily and put on open wound of the thigh. (Patient not taking: Reported on 01/12/2019) 30 tablet 0  . VENTOLIN HFA 108 (90 Base) MCG/ACT inhaler Inhale 2 puffs into the lungs as needed for wheezing.      Current Facility-Administered Medications on File Prior to Visit  Medication Dose Route Frequency Provider Last Rate Last Dose  . Chlorhexidine Gluconate Cloth 2 % PADS 6 each  6 each Topical Once Stegmayer, Kimberly A, PA-C       And  . Chlorhexidine Gluconate Cloth 2 % PADS 6 each  6 each Topical Once Stegmayer, Kimberly A, PA-C        There are no Patient Instructions on file for this visit. No follow-ups on file.   Kris Hartmann, NP  This note was completed with Sales executive.  Any errors are purely unintentional.

## 2019-02-01 ENCOUNTER — Telehealth (INDEPENDENT_AMBULATORY_CARE_PROVIDER_SITE_OTHER): Payer: Self-pay

## 2019-02-01 NOTE — Telephone Encounter (Signed)
I attempted to contact the patient to schedule him for his graft surgery with Dr. Lucky Cowboy. A message was left for a return call.

## 2019-02-04 ENCOUNTER — Encounter (INDEPENDENT_AMBULATORY_CARE_PROVIDER_SITE_OTHER): Payer: Self-pay

## 2019-02-04 NOTE — Telephone Encounter (Signed)
I have scheduled the patient with Dr. Lucky Cowboy for surgery on 02/18/2019 and pre-op and Covid testing on 02/16/2019 at the [place] [place] at 9:00 am. The pre-surgical instructions have been faxed to the patients dialysis center and mailed to his home.

## 2019-02-08 DIAGNOSIS — A499 Bacterial infection, unspecified: Secondary | ICD-10-CM | POA: Insufficient documentation

## 2019-02-16 ENCOUNTER — Other Ambulatory Visit: Admission: RE | Admit: 2019-02-16 | Payer: Medicare Other | Source: Ambulatory Visit

## 2019-02-16 ENCOUNTER — Other Ambulatory Visit (INDEPENDENT_AMBULATORY_CARE_PROVIDER_SITE_OTHER): Payer: Self-pay | Admitting: Nurse Practitioner

## 2019-02-16 NOTE — Telephone Encounter (Signed)
I received a call from pre-admit the patient no showed for his pre-op and Covid testing at 9:00 am today. I contacted the dialysis center and let them know the patient no showed for his appt and that I am unable to make contact with the patient. I spoke with Aldona Bar and Aldona Bar stated she would attempt to contact the patient. I explained that without his testing being done today his surgery will be canceled.

## 2019-02-17 NOTE — Telephone Encounter (Signed)
Spoke with the patient and per him, his doctor told him not to have the surgery at this time. Patient will call back to reschedule at later date.

## 2019-02-18 ENCOUNTER — Ambulatory Visit: Admission: RE | Admit: 2019-02-18 | Payer: Medicare Other | Source: Home / Self Care | Admitting: Vascular Surgery

## 2019-02-18 ENCOUNTER — Encounter: Admission: RE | Payer: Self-pay | Source: Home / Self Care

## 2019-02-18 SURGERY — INSERTION OF ARTERIOVENOUS (AV) GORE-TEX GRAFT ARM
Anesthesia: General | Laterality: Right

## 2019-06-01 ENCOUNTER — Other Ambulatory Visit: Payer: Self-pay

## 2019-06-01 ENCOUNTER — Ambulatory Visit (INDEPENDENT_AMBULATORY_CARE_PROVIDER_SITE_OTHER): Payer: Medicare Other | Admitting: Internal Medicine

## 2019-06-01 DIAGNOSIS — F17210 Nicotine dependence, cigarettes, uncomplicated: Secondary | ICD-10-CM | POA: Diagnosis not present

## 2019-06-01 DIAGNOSIS — R7881 Bacteremia: Secondary | ICD-10-CM

## 2019-06-01 DIAGNOSIS — M109 Gout, unspecified: Secondary | ICD-10-CM | POA: Insufficient documentation

## 2019-06-01 DIAGNOSIS — B9562 Methicillin resistant Staphylococcus aureus infection as the cause of diseases classified elsewhere: Secondary | ICD-10-CM | POA: Diagnosis not present

## 2019-06-01 NOTE — Progress Notes (Addendum)
[facility] for Infectious Disease  Reason for Consult: Recurrent MRSA bacteremia Referring Provider: Dr. Edrick Oh  Assessment: He has multiple potential reasons for recurrent/persistent MRSA bacteremia including his left thigh ulcer, his hemodialysis catheter and/or metastatic sites of infection including endocarditis.  I recommend continuing vancomycin at least 4 more weeks.  I will obtain a transthoracic echocardiogram.  I also recommend replacing his dialysis catheter while he is on vancomycin.  Plan: 1. Continue vancomycin 2. Transthoracic echocardiogram 3. Recommend replacing his right femoral hemodialysis catheter now 4. Follow-up here in 3 to 4 weeks  Patient Active Problem List   Diagnosis Date Noted  . Infection of AV graft for dialysis ([place]) 09/28/2018    Priority: High  . MRSA bacteremia     Priority: High  . Gout 06/01/2019  . 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 Cigarette smoker 06/01/2019  . Status post ankle fusion 05/23/2016  . Esophageal reflux   . Infection and inflammatory reaction due to cardiac device, implant, and graft ([place])   . Diabetes mellitus type 2, uncontrolled ([place]) 04/18/2015  . Status post below knee amputation of left lower extremity 04/17/2015  . 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 ESRD on dialysis ([place]) 10/23/2012  . DIASTOLIC DYSFUNCTION 0000000  . PVD 10/22/2007  . HYPERPARATHYROIDISM, HX OF 10/22/2007    Patient's Medications  New Prescriptions   No medications on file  Previous Medications   ACETAMINOPHEN (TYLENOL) 500 MG TABLET    Take 1,000 mg by mouth as needed for moderate pain (takes everytime with dialysis).   ASPIRIN EC 81 MG EC TABLET    Take 1 tablet (81 mg total) by mouth daily.   AURYXIA 1 GM 210 MG(FE) TABLET    Take 1 tablet by mouth 3 (three) times daily with meals.   B COMPLEX-VITAMIN C-FOLIC ACID (NEPHRO-VITE) 0.8 MG TABS TABLET    Take 1 tablet by  mouth at bedtime.   CYCLOBENZAPRINE (FLEXERIL) 5 MG TABLET    Take 5 mg by mouth as needed for muscle spasms.   DULAGLUTIDE (TRULICITY) A999333 0000000 SOPN    Inject 0.75 mg into the skin every Friday.    IBUPROFEN (ADVIL,MOTRIN) 200 MG TABLET    Take 400 mg by mouth every 6 (six) hours as needed for moderate pain.   INSULIN DETEMIR (LEVEMIR) 100 UNIT/ML INJECTION    Inject 0.2 mLs (20 Units total) into the skin at bedtime.   NOVOLOG FLEXPEN 100 UNIT/ML FLEXPEN    2-3 Units as needed for high blood sugar. Per sliding scale   OXYCODONE-ACETAMINOPHEN (PERCOCET) 7.5-325 MG TABLET    Take 1 tablet by mouth every 4 (four) hours as needed for severe pain.   PANTOPRAZOLE (PROTONIX) 40 MG TABLET    Take 40 mg by mouth daily.   VANCOMYCIN 1,000 MG IN SODIUM CHLORIDE 0.9 % 250 ML    Inject 1,000 mg into the vein every dialysis.   VENTOLIN HFA 108 (90 BASE) MCG/ACT INHALER    Inhale 2 puffs into the lungs as needed for wheezing.   Modified Medications   No medications on file  Discontinued Medications   METRONIDAZOLE (FLAGYL) 500 MG TABLET    nursing to crush Flagyl daily and put on open wound of the thigh.    HPI: [name] is a 53 y.o. male with diabetes and end-stage renal disease on hemodialysis.  A left graft became infected with MRSA in April and the graft was  resected.  Blood cultures were negative at that time.  He had a hemodialysis catheter placed.  Several months ago he developed calciphylaxis and a left outer thigh ulcer.  He developed MRSA bacteremia on 03/08/2019 and was treated with vancomycin administered after dialysis.  Notes indicate that he was also on cefepime at one point.  Repeat blood cultures on 04/23/2019, 05/12/2019 and 05/19/2019 all grew MRSA again.  He says that after vancomycin was stopped last month his left thigh a wound became infected.  He started back on vancomycin after his most recent positive blood culture and his wound is looking better.  He has a dialysis catheter in  his right groin.  He is not certain when that was placed in relation to his positive blood cultures.  I called his dialysis center and was told that his catheter was placed on 03/16/2019.  Review of Systems: Review of Systems  Constitutional: Negative for chills, diaphoresis and fever.  HENT: Negative for congestion and sore throat.   Respiratory: Negative for cough and shortness of breath.   Cardiovascular: Negative for chest pain.  Gastrointestinal: Negative for abdominal pain, diarrhea, nausea and vomiting.      Past Medical History:  Diagnosis Date  . Amputee, below knee, left ([place])   . Anemia   . Blood transfusion   . Bronchitis   . Chest pain   . CHF (congestive heart failure) ([place])   . Chronic kidney disease    dialysis, [facility]  . Diabetes mellitus    type 2  . Dysrhythmia    irregular heartbeat  . ESRD (end stage renal disease) on dialysis ([place])    first HD 06/19/04  . GERD (gastroesophageal reflux disease)   . Hyperparathyroidism ([place])   . Morbid obesity ([place])     Social History   Tobacco Use  . Smoking status: Current Every Day Smoker    Years: 20.00    Types: Cigarettes  . Smokeless tobacco: Never Used  Substance Use Topics  . Alcohol use: No    Alcohol/week: 0.0 standard drinks  . Drug use: Yes    Comment: oxycodone prescribed    Family History  Problem Relation Age of Onset  . Heart disease Mother   . Kidney disease Mother   . Hypertension Mother    Allergies  Allergen Reactions  . Hydrocodone Nausea And Vomiting  . Dilaudid [Hydromorphone Hcl] Itching  . Fentanyl Itching  . Hydrocodone-Acetaminophen Nausea And Vomiting    OBJECTIVE: Vitals:   06/01/19 1404  BP: 135/79  Pulse: 98  Weight: 205 lb (93 kg)   Body mass index is 27.8 kg/m.   Physical Exam Constitutional:      General: He is not in acute distress. Cardiovascular:     Rate and Rhythm: Normal rate and regular rhythm.     Heart sounds: No murmur.  Pulmonary:      Effort: Pulmonary effort is normal.     Breath sounds: Normal breath sounds.  Abdominal:     Palpations: Abdomen is soft.     Tenderness: There is no abdominal tenderness.  Musculoskeletal:     Comments: He has had prior left BKA and wears a prosthesis.  Skin:    Comments: Scattered hyperpigmented lesions.  Has left inner thigh incision has healed nicely without evidence of ongoing infection.  His left outer thigh ulcer has a clean dry dressing.  Neurological:     General: No focal deficit present.  Psychiatric:  Mood and Affect: Mood normal.     Microbiology: No results found for this or any previous visit (from the past 240 hour(s)).  Michel Bickers, MD [facility] [phone] pager   [phone] cell 06/01/2019, 2:23 PM

## 2019-06-08 ENCOUNTER — Other Ambulatory Visit: Payer: Self-pay

## 2019-06-08 ENCOUNTER — Ambulatory Visit (HOSPITAL_COMMUNITY)
Admission: RE | Admit: 2019-06-08 | Discharge: 2019-06-08 | Disposition: A | Discharge status: Home / Self Care | Payer: Medicare Other | Source: Ambulatory Visit | Attending: Internal Medicine | Admitting: Internal Medicine

## 2019-06-08 DIAGNOSIS — I509 Heart failure, unspecified: Secondary | ICD-10-CM | POA: Diagnosis not present

## 2019-06-08 DIAGNOSIS — B9562 Methicillin resistant Staphylococcus aureus infection as the cause of diseases classified elsewhere: Secondary | ICD-10-CM | POA: Diagnosis not present

## 2019-06-08 DIAGNOSIS — E119 Type 2 diabetes mellitus without complications: Secondary | ICD-10-CM | POA: Diagnosis not present

## 2019-06-08 DIAGNOSIS — R7881 Bacteremia: Secondary | ICD-10-CM | POA: Diagnosis not present

## 2019-06-08 DIAGNOSIS — I081 Rheumatic disorders of both mitral and tricuspid valves: Secondary | ICD-10-CM | POA: Insufficient documentation

## 2019-06-08 NOTE — Progress Notes (Signed)
  Echocardiogram 2D Echocardiogram has been performed.  Bobbye Charleston 06/08/2019, 3:59 PM

## 2019-06-19 ENCOUNTER — Emergency Department (HOSPITAL_COMMUNITY)
Admission: EM | Admit: 2019-06-19 | Discharge: 2019-06-20 | Disposition: A | Discharge status: Home / Self Care | Payer: Medicare Other | Attending: Emergency Medicine | Admitting: Emergency Medicine

## 2019-06-19 ENCOUNTER — Emergency Department (HOSPITAL_COMMUNITY): Payer: Medicare Other

## 2019-06-19 ENCOUNTER — Other Ambulatory Visit: Payer: Self-pay

## 2019-06-19 DIAGNOSIS — Z5321 Procedure and treatment not carried out due to patient leaving prior to being seen by health care provider: Secondary | ICD-10-CM | POA: Insufficient documentation

## 2019-06-19 DIAGNOSIS — R12 Heartburn: Secondary | ICD-10-CM | POA: Insufficient documentation

## 2019-06-19 LAB — CBC
HCT: 38.1 % — ABNORMAL LOW (ref 39.0–52.0)
Hemoglobin: 11.4 g/dL — ABNORMAL LOW (ref 13.0–17.0)
MCH: 27.1 pg (ref 26.0–34.0)
MCHC: 29.9 g/dL — ABNORMAL LOW (ref 30.0–36.0)
MCV: 90.5 fL (ref 80.0–100.0)
Platelets: 249 10*3/uL (ref 150–400)
RBC: 4.21 MIL/uL — ABNORMAL LOW (ref 4.22–5.81)
RDW: 19 % — ABNORMAL HIGH (ref 11.5–15.5)
WBC: 9.1 10*3/uL (ref 4.0–10.5)
nRBC: 0 % (ref 0.0–0.2)

## 2019-06-19 LAB — BASIC METABOLIC PANEL
Anion gap: 23 — ABNORMAL HIGH (ref 5–15)
BUN: 48 mg/dL — ABNORMAL HIGH (ref 6–20)
CO2: 31 mmol/L (ref 22–32)
Calcium: 8.2 mg/dL — ABNORMAL LOW (ref 8.9–10.3)
Chloride: 84 mmol/L — ABNORMAL LOW (ref 98–111)
Creatinine, Ser: 13.55 mg/dL — ABNORMAL HIGH (ref 0.61–1.24)
GFR calc Af Amer: 4 mL/min — ABNORMAL LOW (ref >60)
GFR calc non Af Amer: 4 mL/min — ABNORMAL LOW (ref >60)
Glucose, Bld: 96 mg/dL (ref 70–99)
Potassium: 3.3 mmol/L — ABNORMAL LOW (ref 3.5–5.1)
Sodium: 138 mmol/L (ref 135–145)

## 2019-06-19 NOTE — ED Notes (Signed)
Attempted to triage pt - states he had to go to restroom

## 2019-06-19 NOTE — ED Triage Notes (Signed)
Pt states he needs dialysis. He does dialysis Mon-Wed-Fri and last dialysis was Wednesday. Also c/o diarrhea (because of the abx he is taking).

## 2019-06-20 NOTE — ED Notes (Signed)
Pt was asked not toleave but refused to stay. Pt said he would go to dialysis tomorrow or come back if he got any worse. ADVISED AGAINST.

## 2019-07-13 ENCOUNTER — Encounter (INDEPENDENT_AMBULATORY_CARE_PROVIDER_SITE_OTHER): Payer: Medicare Other

## 2019-07-13 ENCOUNTER — Other Ambulatory Visit (INDEPENDENT_AMBULATORY_CARE_PROVIDER_SITE_OTHER): Payer: Self-pay | Admitting: Vascular Surgery

## 2019-07-13 ENCOUNTER — Other Ambulatory Visit: Payer: Self-pay

## 2019-07-13 ENCOUNTER — Ambulatory Visit (INDEPENDENT_AMBULATORY_CARE_PROVIDER_SITE_OTHER): Payer: Medicare Other | Admitting: Vascular Surgery

## 2019-07-13 ENCOUNTER — Encounter (INDEPENDENT_AMBULATORY_CARE_PROVIDER_SITE_OTHER): Payer: Self-pay | Admitting: Vascular Surgery

## 2019-07-13 ENCOUNTER — Ambulatory Visit (INDEPENDENT_AMBULATORY_CARE_PROVIDER_SITE_OTHER): Payer: Medicare Other

## 2019-07-13 VITALS — BP 112/70 | HR 114 | Resp 20 | Ht 73.0 in | Wt 190.0 lb

## 2019-07-13 DIAGNOSIS — E1165 Type 2 diabetes mellitus with hyperglycemia: Secondary | ICD-10-CM | POA: Diagnosis not present

## 2019-07-13 DIAGNOSIS — S81809A Unspecified open wound, unspecified lower leg, initial encounter: Secondary | ICD-10-CM

## 2019-07-13 DIAGNOSIS — R7881 Bacteremia: Secondary | ICD-10-CM | POA: Insufficient documentation

## 2019-07-13 DIAGNOSIS — S71102A Unspecified open wound, left thigh, initial encounter: Secondary | ICD-10-CM

## 2019-07-13 DIAGNOSIS — S71009A Unspecified open wound, unspecified hip, initial encounter: Secondary | ICD-10-CM | POA: Insufficient documentation

## 2019-07-13 DIAGNOSIS — S71002A Unspecified open wound, left hip, initial encounter: Secondary | ICD-10-CM | POA: Diagnosis not present

## 2019-07-13 DIAGNOSIS — K449 Diaphragmatic hernia without obstruction or gangrene: Secondary | ICD-10-CM | POA: Insufficient documentation

## 2019-07-13 DIAGNOSIS — J189 Pneumonia, unspecified organism: Secondary | ICD-10-CM | POA: Insufficient documentation

## 2019-07-13 DIAGNOSIS — Z89512 Acquired absence of left leg below knee: Secondary | ICD-10-CM | POA: Diagnosis not present

## 2019-07-13 DIAGNOSIS — N186 End stage renal disease: Secondary | ICD-10-CM | POA: Diagnosis not present

## 2019-07-13 DIAGNOSIS — Z992 Dependence on renal dialysis: Secondary | ICD-10-CM

## 2019-07-13 DIAGNOSIS — I38 Endocarditis, valve unspecified: Secondary | ICD-10-CM | POA: Insufficient documentation

## 2019-07-13 NOTE — Assessment & Plan Note (Signed)
Being managed by the wound center in [name] and has had a very difficult time getting the wounds to heal.  Assessment for his perfusion is done today for further evaluation.

## 2019-07-13 NOTE — Assessment & Plan Note (Signed)
blood glucose control important in reducing the progression of atherosclerotic disease. Also, involved in wound healing. On appropriate medications.  

## 2019-07-13 NOTE — Assessment & Plan Note (Signed)
Very limited dialysis access options.  Currently using a right thigh PermCath.  Until he has healed all of his wounds, I would not really consider a new graft placement.  Likely the only site for his graft will be the right thigh and we would have to move the PermCath to the left side as well.  For now, we will continue the right thigh PermCath

## 2019-07-13 NOTE — Progress Notes (Signed)
MRN : [id number]  [name] is a 53 y.o. (1965-07-23) male who presents with chief complaint of  Chief Complaint  Patient presents with  . Follow-up    non healing left leg wound  .  History of Present Illness: Patient returns today in follow up.  I have taken care of him for many years largely related to his dialysis access.  He has had extensive dialysis access over the many years he has been on dialysis and currently is maintained through a right thigh PermCath.  We did a left thigh AV graft that had to be excised earlier this year.  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  This is the side ipsilateral to his amputation.  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  At his most recent visit, the left thigh wound became much larger with a lot of necrotic tissue and a poor granulation base.  I discussed this with his wound care physician in [facility] and he is brought in today to assess his perfusion and determine further treatment options.  No elevated velocities were seen on duplex today of the left common femoral artery, SFA proximally, or profunda femoris artery that would indicate a hemodynamically significant stenosis although inflow disease proximal to the common femoral artery cannot be excluded on this study.  Current Outpatient Medications  Medication Sig Dispense Refill  . acetaminophen (TYLENOL) 500 MG tablet Take 1,000 mg by mouth as needed for moderate pain (takes everytime with dialysis).    Marland Kitchen aspirin EC 81 MG EC tablet Take 1 tablet (81 mg total) by mouth daily. 30 tablet 11  . AURYXIA 1 GM 210 MG(Fe) tablet Take 1 tablet by mouth 3 (three) times daily with meals.    Marland Kitchen b complex-vitamin c-folic acid (NEPHRO-VITE) 0.8 MG TABS tablet Take 1 tablet by mouth at bedtime. 30 tablet 0  . heparin 1000 UNIT/ML injection Heparin Sodium (Porcine) 1,000  Units/mL Catheter Lock Arterial    . insulin detemir (LEVEMIR) 100 UNIT/ML injection Inject 0.2 mLs (20 Units total) into the skin at bedtime. (Patient taking differently: Inject 4-20 Units into the skin at bedtime. Per sliding scale) 10 mL 11  . Methoxy PEG-Epoetin Beta (MIRCERA) 100 MCG/0.3ML SOSY Mircera    . NOVOLOG FLEXPEN 100 UNIT/ML FlexPen 2-3 Units as needed for high blood sugar. Per sliding scale    . ondansetron (ZOFRAN) 4 MG tablet Take by mouth.    . oxyCODONE-acetaminophen (PERCOCET) 7.5-325 MG tablet Take 1 tablet by mouth every 4 (four) hours as needed for severe pain. 30 tablet 0  . pantoprazole (PROTONIX) 40 MG tablet Take 40 mg by mouth daily.    . vancomycin 1,000 mg in sodium chloride 0.9 % 250 mL Inject 1,000 mg into the vein every dialysis.    Marland Kitchen VENTOLIN HFA 108 (90 Base) MCG/ACT inhaler Inhale 2 puffs into the lungs as needed for wheezing.     . Vitamin E 100 UNIT/GM CREA Take by mouth.    . Blood Glucose Monitoring Suppl (ONE TOUCH ULTRA 2) w/Device KIT U D    . cyclobenzaprine (FLEXERIL) 5 MG tablet Take 5 mg by mouth as needed for muscle spasms.    . Dulaglutide (TRULICITY) 7.61 YW/7.3XT SOPN Inject 0.75 mg into the skin every Friday.     Marland Kitchen ibuprofen (ADVIL,MOTRIN) 200 MG tablet Take  400 mg by mouth every 6 (six) hours as needed for moderate pain.    Marland Kitchen RENVELA 800 MG tablet Take 2,400 mg by mouth 3 (three) times daily.    . VELPHORO 500 MG chewable tablet Chew 500 mg by mouth 3 (three) times daily.     No current facility-administered medications for this visit.   Facility-Administered Medications Ordered in Other Visits  Medication Dose Route Frequency Provider Last Rate Last Admin  . Chlorhexidine Gluconate Cloth 2 % PADS 6 each  6 each Topical Once Stegmayer, Kimberly A, PA-C       And  . Chlorhexidine Gluconate Cloth 2 % PADS 6 each  6 each Topical Once Stegmayer, Janalyn Harder, PA-C        Past Medical History:  Diagnosis Date  . Amputee, below knee, left  ([place])   . Anemia   . Blood transfusion   . Bronchitis   . Chest pain   . CHF (congestive heart failure) ([place])   . Chronic kidney disease    dialysis, [facility]W-F, [facility]  . Diabetes mellitus    type 2  . Dysrhythmia    irregular heartbeat  . ESRD (end stage renal disease) on dialysis ([place])    first HD 06/19/04  . GERD (gastroesophageal reflux disease)   . Hyperparathyroidism ([place])   . Morbid obesity ([place])     Past Surgical History:  Procedure Laterality Date  . A/V SHUNTOGRAM Left 09/30/2018   Procedure: A/V SHUNTOGRAM;  Surgeon: Algernon Huxley, MD;  Location: [facility];  Service: Cardiovascular;  Laterality: Left;  . AMPUTATION Left 01/20/2015   Procedure: Left Great Toe Amputation;  Surgeon: Newt Minion, MD;  Location: [place];  Service: Orthopedics;  Laterality: Left;  . AMPUTATION Left 03/07/2015   Procedure: Left Foot 1st and 2nd Ray Amputation;  Surgeon: Newt Minion, MD;  Location: [place];  Service: Orthopedics;  Laterality: Left;  . AMPUTATION Left 04/14/2015   Procedure: LEFT BELOW THE KNEE AMPUTATION ;  Surgeon: Newt Minion, MD;  Location: [place];  Service: Orthopedics;  Laterality: Left;  . ANKLE FUSION     rt foot charcot joint 2008 with refusion  . AV FISTULA PLACEMENT  10/11/2010  . AV FISTULA PLACEMENT  07/02/2011   Procedure: INSERTION OF ARTERIOVENOUS (AV) GORE-TEX GRAFT ARM;  Surgeon: Elam Dutch, MD;  Location: [place];  Service: Vascular;  Laterality: Left;  . AV FISTULA PLACEMENT Right 11/06/2012   Procedure: INSERTION OF ARTERIOVENOUS (AV) GORE-TEX GRAFT ARM;  Surgeon: Mal Misty, MD;  Location: [place];  Service: Vascular;  Laterality: Right;  . AV FISTULA PLACEMENT Right 03/21/2016   Procedure: INSERTION OF ARTERIOVENOUS (AV) GORE-TEX GRAFT ARM ( LOOP FOREARM );  Surgeon: Algernon Huxley, MD;  Location: [facility];  Service: Vascular;  Laterality: Right;  . AV FISTULA PLACEMENT Left 11/07/2016   Procedure: INSERTION OF ARTERIOVENOUS (AV) Artegraft;  Surgeon:  Algernon Huxley, MD;  Location: [facility];  Service: Vascular;  Laterality: Left;  . AV FISTULA PLACEMENT Left 10/01/2018   Procedure: INSERTION OF ARTERIOVENOUS (AV) GORE-TEX GRAFT THIGH REVISION;  Surgeon: Algernon Huxley, MD;  Location: [facility];  Service: Vascular;  Laterality: Left;  . [place] REMOVAL Right 11/06/2012   Procedure: REMOVAL OF ARTERIOVENOUS GORETEX GRAFT ([place]);  Surgeon: Mal Misty, MD;  Location: [place];  Service: Vascular;  Laterality: Right;  . DIALYSIS/PERMA CATHETER INSERTION N/A 10/05/2018   Procedure: DIALYSIS/PERMA CATHETER INSERTION;  Surgeon: Leotis Pain  S, MD;  Location: [place] [facility] [facility];  Service: Cardiovascular;  Laterality: N/A;  . ENDARTERECTOMY FEMORAL  11/07/2016   Procedure: ENDARTERECTOMY FEMORAL;  Surgeon: Algernon Huxley, MD;  Location: [facility];  Service: Vascular;;  . HERNIA REPAIR     Umbilical Hernia  . INSERTION OF DIALYSIS CATHETER  01/20/2012   Procedure: INSERTION OF DIALYSIS CATHETER;  Surgeon: Serafina Mitchell, MD;  Location: [place];  Service: Vascular;  Laterality: N/A;  insertion left internal jugular dialysis catheter 23cm  . INSERTION OF DIALYSIS CATHETER Right 11/09/2012   Procedure: INSERTION OF DIALYSIS CATHETER;  Surgeon: Conrad [place], MD;  Location: [place];  Service: Vascular;  Laterality: Right;  Right  Femoral Vein  . INSERTION OF DIALYSIS CATHETER N/A 08/18/2015   Procedure: INSERTION OF DIALYSIS CATHETER RIGHT FEMORAL VEIN;  Surgeon: Mal Misty, MD;  Location: [place];  Service: Cardiovascular;  Laterality: N/A;  . PARATHYROIDECTOMY    . PERIPHERAL VASCULAR CATHETERIZATION Right 04/04/2016   Procedure: A/V Shuntogram/Fistulagram;  Surgeon: Algernon Huxley, MD;  Location: [place] [facility];  Service: Cardiovascular;  Laterality: Right;  . REMOVAL OF A HERO DEVICE Left 08/14/2015   Procedure: REMOVAL OF LEFT ARM HERO DEVICE;  Surgeon: Rosetta Posner, MD;  Location: [place];  Service: Vascular;  Laterality: Left;  . REMOVAL OF GRAFT Left 11/05/2018    Procedure: I&D WITH GRAFT EXCISION;  Surgeon: Algernon Huxley, MD;  Location: [facility];  Service: Vascular;  Laterality: Left;  . TEE WITHOUT CARDIOVERSION N/A 08/18/2015   Procedure: TRANSESOPHAGEAL ECHOCARDIOGRAM (TEE);  Surgeon: Mal Misty, MD;  Location: [place];  Service: Cardiovascular;  Laterality: N/A;  . TEE WITHOUT CARDIOVERSION N/A 12/21/2015   Procedure: TRANSESOPHAGEAL ECHOCARDIOGRAM (TEE);  Surgeon: Dixie Dials, MD;  Location: [place];  Service: Cardiovascular;  Laterality: N/A;  . TEMPORARY DIALYSIS CATHETER N/A 09/29/2018   Procedure: TEMPORARY DIALYSIS CATHETER INSERTION;  Surgeon: Katha Cabal, MD;  Location: [place] [facility];  Service: Cardiovascular;  Laterality: N/A;  . THROMBECTOMY    . Umbilcial Hernia    . UPPER EXTREMITY VENOGRAPHY Left 10/31/2016   Procedure: Upper Extremity Venography;  Surgeon: Algernon Huxley, MD;  Location: [place] [facility];  Service: Cardiovascular;  Laterality: Left;  Marland Kitchen VENOGRAM N/A 01/06/2014   Procedure: VENOGRAM;  Surgeon: Conrad Haxtun, MD;  Location: [facility];  Service: Cardiovascular;  Laterality: N/A;     Social History   Tobacco Use  . Smoking status: Current Every Day Smoker    Years: 20.00    Types: Cigarettes  . Smokeless tobacco: Never Used  Substance Use Topics  . Alcohol use: No    Alcohol/week: 0.0 standard drinks  . Drug use: Yes    Comment: oxycodone prescribed     Family History  Problem Relation Age of Onset  . Heart disease Mother   . Kidney disease Mother   . Hypertension Mother   no bleeding or clotting disorders, no aneurysms  Allergies  Allergen Reactions  . Hydrocodone Nausea And Vomiting  . Dilaudid [Hydromorphone Hcl] Itching  . Fentanyl Itching  . Amlodipine Itching  . Dacarbazine Nausea And Vomiting  . Propofol Itching  . Hydrocodone-Acetaminophen Nausea And Vomiting    Review of Systems: Negative Unless Checked Constitutional: '[x]' ?Weight loss'[]' ?Fever'[]' ?Chills  Cardiac:'[]' ?Chest pain'[]' ? Atrial Fibrillation'[]' ?Palpitations '[]' ?Shortness of breath when laying flat '[]' ?Shortness of breath with exertion. '[]' ?Shortness of breath at rest Vascular: '[]' ?Pain in legs with walking'[]' ?Pain in legswith standing'[]' ?Pain in legs when laying flat '[]' ?  Claudication  '[]' ?Pain in feet when laying flat '[]' ?History of DVT '[]' ?Phlebitis '[]' ?Swelling in legs '[]' ?Varicose veins '[x]' ?Non-healing ulcers Pulmonary: '[]' ?Uses home oxygen '[]' ?Productive cough'[]' ?Hemoptysis '[]' ?Wheeze '[]' ?COPD '[]' ?Asthma Neurologic: '[]' ?Dizziness'[]' ?Seizures '[]' ?Blackouts'[]' ?History of stroke '[]' ?History of TIA'[]' ?Aphasia '[]' ?Temporary Blindness'[]' ?Weaknessor numbness in arm '[x]' ?Weakness or numbnessin leg Musculoskeletal:'[]' ?Joint swelling '[]' ?Joint pain '[]' ?Low back pain  '[]' ? History of Knee Replacement '[]' ?Arthritis '[]' ?back Surgeries'[]' ? Spinal Stenosis  Hematologic:'[]' ?Easy bruising'[]' ?Easy bleeding '[]' ?Hypercoagulable state '[x]' ?Anemic Gastrointestinal:'[]' ?Diarrhea '[]' ?Vomiting'[]' ?Gastroesophageal reflux/heartburn'[]' ?Difficulty swallowing. '[]' ?Abdominal pain Genitourinary: '[x]' ?Chronic kidney disease '[]' ?Difficulturination '[]' ?Anuric'[]' ?Blood in urine '[]' ?Frequenturination '[]' ?Burning with urination'[]' ?Hematuria Skin: '[]' ?Rashes '[x]' ?Ulcers '[x]' ?Wounds Psychological: '[]' ?History of anxiety'[]' ?History of major depression  '[]' ? Memory Difficulties    Physical Examination  BP 112/70 (BP Location: Left Arm)   Pulse (!) 114   Resp 20   Ht '6\' 1"'  (1.854 m)   Wt 190 lb (86.2 kg)   BMI 25.07 kg/m  Gen:  WD/WN, NAD Head: [place]/AT, No temporalis wasting. Ear/Nose/Throat: Hearing grossly intact, nares w/o erythema or drainage Eyes: Conjunctiva clear. Sclera non-icteric Neck: Supple.  Trachea midline Pulmonary:  Good air movement, no use of accessory muscles.  Cardiac: RRR, no JVD Vascular: Right femoral PermCath in place without erythema or  drainage Vessel Right Left  Radial Palpable Palpable                          PT Not Palpable Not Palpable  DP Not Palpable Not Palpable   Gastrointestinal: soft, non-tender/non-distended. No guarding/reflex.  Musculoskeletal: M/S 5/5 throughout.  No deformity or atrophy.  Extensive ulceration with necrotic eschar is present throughout the left anterior and lateral thigh area.  This is ipsilateral to his amputation site.   Neurologic: Sensation grossly intact in extremities.  Symmetrical.  Speech is fluent.  Psychiatric: Judgment intact, Mood & affect appropriate for pt's clinical situation. Dermatologic: Left thigh wound as above       Labs Recent Results (from the past 2160 hour(s))  Basic metabolic panel     Status: Abnormal   Collection Time: 06/19/19 10:28 PM  Result Value Ref Range   Sodium 138 135 - 145 mmol/L   Potassium 3.3 (L) 3.5 - 5.1 mmol/L   Chloride 84 (L) 98 - 111 mmol/L   CO2 31 22 - 32 mmol/L   Glucose, Bld 96 70 - 99 mg/dL   BUN 48 (H) 6 - 20 mg/dL   Creatinine, Ser 13.55 (H) 0.61 - 1.24 mg/dL   Calcium 8.2 (L) 8.9 - 10.3 mg/dL   GFR calc non Af Amer 4 (L) >60 mL/min   GFR calc Af Amer 4 (L) >60 mL/min   Anion gap 23 (H) 5 - 15    Comment: Performed at [facility], [address] [place], [place] [address]  CBC     Status: Abnormal   Collection Time: 06/19/19 10:28 PM  Result Value Ref Range   WBC 9.1 4.0 - 10.5 K/uL   RBC 4.21 (L) 4.22 - 5.81 MIL/uL   Hemoglobin 11.4 (L) 13.0 - 17.0 g/dL   HCT 38.1 (L) 39.0 - 52.0 %   MCV 90.5 80.0 - 100.0 fL   MCH 27.1 26.0 - 34.0 pg   MCHC 29.9 (L) 30.0 - 36.0 g/dL   RDW 19.0 (H) 11.5 - 15.5 %   Platelets 249 150 - 400 K/uL   nRBC 0.0 0.0 - 0.2 %    Comment: Performed at [facility] [address] [place], [place] [address]    Radiology DG Chest 2 View  Result  Date: 06/19/2019 CLINICAL DATA:  Question of fluid overload EXAM: CHEST - 2 VIEW COMPARISON:  February 20, 2018 well FINDINGS: The  heart size and mediastinal contours are within normal limits. Aortic knob calcifications. Both lungs are clear. The visualized skeletal structures are unremarkable. IMPRESSION: No active cardiopulmonary disease. Electronically Signed   By: Prudencio Pair M.D.   On: 06/19/2019 23:00    Assessment/Plan  Diabetes mellitus type 2, uncontrolled (HCC) blood glucose control important in reducing the progression of atherosclerotic disease. Also, involved in wound healing. On appropriate medications.   Calciphylaxis Being managed by the wound center in [facility] and has had a very difficult time getting the wounds to heal.  Assessment for his perfusion is done today for further evaluation.  ESRD on dialysis [facility]) Very limited dialysis access options.  Currently using a right thigh PermCath.  Until he has healed all of his wounds, I would not really consider a new graft placement.  Likely the only site for his graft will be the right thigh and we would have to move the PermCath to the left side as well.  For now, we will continue the right thigh PermCath  Open wound of hip and thigh, complicated The patient has an extensive left thigh wound with necrotic tissue.  Although his flow appears to be reasonable for wound healing, there is possibility of inflow disease and if his wound does not improve after debridement I would certainly consider angiogram to evaluate for aortoiliac disease.  For now, wound debridement is likely benefit to remove the necrotic tissue and try to get to a good granulation base.  A wound VAC may be helpful in terms of getting this healed long-term 2.  I have offered to do that in the operating room at the patient's request because of discomfort with debridement without anesthesia.  That certainly seems reasonable.  After the debridement, we will try to coordinate care with his wound care center who has done an excellent job up to this point.    Leotis Pain, MD  07/13/2019 4:30 PM     This note was created with Dragon medical transcription system.  Any errors from dictation are purely unintentional

## 2019-07-13 NOTE — Assessment & Plan Note (Signed)
The patient has an extensive left thigh wound with necrotic tissue.  Although his flow appears to be reasonable for wound healing, there is possibility of inflow disease and if his wound does not improve after debridement I would certainly consider angiogram to evaluate for aortoiliac disease.  For now, wound debridement is likely benefit to remove the necrotic tissue and try to get to a good granulation base.  A wound VAC may be helpful in terms of getting this healed long-term 2.  I have offered to do that in the operating room at the patient's request because of discomfort with debridement without anesthesia.  That certainly seems reasonable.  After the debridement, we will try to coordinate care with his wound care center who has done an excellent job up to this point.

## 2019-07-15 ENCOUNTER — Encounter
Admission: RE | Admit: 2019-07-15 | Discharge: 2019-07-15 | Disposition: A | Discharge status: Home / Self Care | Payer: Medicare Other | Source: Ambulatory Visit | Attending: Vascular Surgery | Admitting: Vascular Surgery

## 2019-07-15 ENCOUNTER — Other Ambulatory Visit: Payer: Self-pay

## 2019-07-15 NOTE — Pre-Procedure Instructions (Signed)
Secure chat to Dr Lucky Cowboy to tell him we need orders for upcoming surgery on 07/20/18.  Willl do CBC and Met B due to recent abnormal labs. Other labs will be done DOS

## 2019-07-15 NOTE — Patient Instructions (Signed)
Your procedure is scheduled on:Wed. 1/6 Report to Day Surgery. To find out your arrival time please call [phone] between 1PM - 3PM on Tues .1/5  Remember: Instructions that are not followed completely may result in serious medical risk,  up to and including death, or upon the discretion of your surgeon and anesthesiologist your  surgery may need to be rescheduled.     _X__ 1. Do not eat food after midnight the night before your procedure.                 No gum chewing or hard candies. You may drink clear liquids up to 2 hours                 before you are scheduled to arrive for your surgery- DO not drink clear                 liquids within 2 hours of the start of your surgery.                 Clear Liquids include:  water,Black Coffee or Tea (Do not add                 anything to coffee or tea).  __X__2.  On the morning of surgery brush your teeth with toothpaste and water, you                may rinse your mouth with mouthwash if you wish.  Do not swallow any toothpaste of mouthwash.     ___ 3.  No Alcohol for 24 hours before or after surgery.   _X__ 4.  Do Not Smoke or use e-cigarettes For 24 Hours Prior to Your Surgery.                 Do not use any chewable tobacco products for at least 6 hours prior to                 surgery.  ____  5.  Bring all medications with you on the day of surgery if instructed.   __x__  6.  Notify your doctor if there is any change in your medical condition      (cold, fever, infections).     Do not wear jewelry, make-up, hairpins, clips or nail polish. Do not wear lotions, powders, or perfumes. You may wear deodorant. Do not shave 48 hours prior to surgery. Men may shave face and neck. Do not bring valuables to the hospital.    [facility] is not responsible for any belongings or valuables.  Contacts, dentures or bridgework may not be worn into surgery. Leave your suitcase in the car. After surgery it may be  brought to your room. For patients admitted to the hospital, discharge time is determined by your treatment team.   Patients discharged the day of surgery will not be allowed to drive home.   Please read over the following fact sheets that you were given:     _x___ Take these medicines the morning of surgery with A SIP OF WATER:    1.oxyCODONE-acetaminophen (PERCOCET) 7.5-325 MG tablet  If needed   2. pantoprazole (PROTONIX) 40 MG tablet night before and morning of surgery  3. ondansetron (ZOFRAN) 4 MG tablet if needed  4.  5.  6.  ____ Fleet Enema (as directed)   __x__ Use CHG Soap as directed  __x__ Use inhalers on the day of surgeryVENTOLIN HFA 108 (90 Base) MCG/ACT  inhaler  And bring with you.  ____ Stop metformin 2 days prior to surgery    ____ Take 1/2 of usual insulin dose the night before surgery. No insulin the morning          of surgery.   __x__ continue aspirin None on day of surgery  ____ Stop Anti-inflammatories on    __x__ Stop supplements until after surgery.  Vitamin E 100 UNIT/GM CREA  ____ Bring C-Pap to the hospital.

## 2019-07-17 ENCOUNTER — Emergency Department: Payer: Medicare Other

## 2019-07-17 ENCOUNTER — Emergency Department
Admission: EM | Admit: 2019-07-17 | Discharge: 2019-07-17 | Disposition: A | Discharge status: Home / Self Care | Payer: Medicare Other | Attending: Emergency Medicine | Admitting: Emergency Medicine

## 2019-07-17 DIAGNOSIS — Z794 Long term (current) use of insulin: Secondary | ICD-10-CM | POA: Insufficient documentation

## 2019-07-17 DIAGNOSIS — F17210 Nicotine dependence, cigarettes, uncomplicated: Secondary | ICD-10-CM | POA: Diagnosis not present

## 2019-07-17 DIAGNOSIS — R110 Nausea: Secondary | ICD-10-CM | POA: Insufficient documentation

## 2019-07-17 DIAGNOSIS — R109 Unspecified abdominal pain: Secondary | ICD-10-CM | POA: Diagnosis present

## 2019-07-17 DIAGNOSIS — Z992 Dependence on renal dialysis: Secondary | ICD-10-CM | POA: Diagnosis not present

## 2019-07-17 DIAGNOSIS — K30 Functional dyspepsia: Secondary | ICD-10-CM

## 2019-07-17 DIAGNOSIS — Z7982 Long term (current) use of aspirin: Secondary | ICD-10-CM | POA: Insufficient documentation

## 2019-07-17 DIAGNOSIS — I132 Hypertensive heart and chronic kidney disease with heart failure and with stage 5 chronic kidney disease, or end stage renal disease: Secondary | ICD-10-CM | POA: Diagnosis not present

## 2019-07-17 DIAGNOSIS — I509 Heart failure, unspecified: Secondary | ICD-10-CM | POA: Diagnosis not present

## 2019-07-17 DIAGNOSIS — N186 End stage renal disease: Secondary | ICD-10-CM | POA: Diagnosis not present

## 2019-07-17 DIAGNOSIS — Z79899 Other long term (current) drug therapy: Secondary | ICD-10-CM | POA: Insufficient documentation

## 2019-07-17 DIAGNOSIS — E1140 Type 2 diabetes mellitus with diabetic neuropathy, unspecified: Secondary | ICD-10-CM | POA: Diagnosis not present

## 2019-07-17 DIAGNOSIS — E1151 Type 2 diabetes mellitus with diabetic peripheral angiopathy without gangrene: Secondary | ICD-10-CM | POA: Diagnosis not present

## 2019-07-17 DIAGNOSIS — N281 Cyst of kidney, acquired: Secondary | ICD-10-CM | POA: Diagnosis not present

## 2019-07-17 DIAGNOSIS — E1122 Type 2 diabetes mellitus with diabetic chronic kidney disease: Secondary | ICD-10-CM | POA: Insufficient documentation

## 2019-07-17 DIAGNOSIS — Z7901 Long term (current) use of anticoagulants: Secondary | ICD-10-CM | POA: Diagnosis not present

## 2019-07-17 LAB — COMPREHENSIVE METABOLIC PANEL
ALT: 8 U/L (ref 0–44)
AST: 14 U/L — ABNORMAL LOW (ref 15–41)
Albumin: 2.6 g/dL — ABNORMAL LOW (ref 3.5–5.0)
Alkaline Phosphatase: 70 U/L (ref 38–126)
Anion gap: 31 — ABNORMAL HIGH (ref 5–15)
BUN: 90 mg/dL — ABNORMAL HIGH (ref 6–20)
CO2: 26 mmol/L (ref 22–32)
Calcium: 8 mg/dL — ABNORMAL LOW (ref 8.9–10.3)
Chloride: 83 mmol/L — ABNORMAL LOW (ref 98–111)
Creatinine, Ser: 15.45 mg/dL — ABNORMAL HIGH (ref 0.61–1.24)
GFR calc Af Amer: 4 mL/min — ABNORMAL LOW (ref >60)
GFR calc non Af Amer: 3 mL/min — ABNORMAL LOW (ref >60)
Glucose, Bld: 233 mg/dL — ABNORMAL HIGH (ref 70–99)
Potassium: 4.3 mmol/L (ref 3.5–5.1)
Sodium: 140 mmol/L (ref 135–145)
Total Bilirubin: 0.8 mg/dL (ref 0.3–1.2)
Total Protein: 7.5 g/dL (ref 6.5–8.1)

## 2019-07-17 LAB — CBC
HCT: 26.2 % — ABNORMAL LOW (ref 39.0–52.0)
Hemoglobin: 8.2 g/dL — ABNORMAL LOW (ref 13.0–17.0)
MCH: 25.5 pg — ABNORMAL LOW (ref 26.0–34.0)
MCHC: 31.3 g/dL (ref 30.0–36.0)
MCV: 81.4 fL (ref 80.0–100.0)
Platelets: 313 10*3/uL (ref 150–400)
RBC: 3.22 MIL/uL — ABNORMAL LOW (ref 4.22–5.81)
RDW: 19 % — ABNORMAL HIGH (ref 11.5–15.5)
WBC: 10 10*3/uL (ref 4.0–10.5)
nRBC: 0 % (ref 0.0–0.2)

## 2019-07-17 LAB — LIPASE, BLOOD: Lipase: 29 U/L (ref 11–51)

## 2019-07-17 MED ORDER — SODIUM CHLORIDE 0.9 % IV BOLUS
250.0000 mL | Freq: Once | INTRAVENOUS | Status: AC
  Administered 2019-07-17: 10:00:00 250 mL via INTRAVENOUS

## 2019-07-17 MED ORDER — SODIUM CHLORIDE 0.9% FLUSH
3.0000 mL | Freq: Once | INTRAVENOUS | Status: AC
  Administered 2019-07-17: 09:00:00 3 mL via INTRAVENOUS

## 2019-07-17 NOTE — ED Notes (Signed)
Patient transported to CT 

## 2019-07-17 NOTE — Discharge Instructions (Signed)
Please continue follow-up for your planned dialysis session today at 12:30 PM at your dialysis center at [facility] kidney center.  Please reach out to your nephrologist for upcoming appointment, and continue to keep your upcoming appointment with Dr. Lucky Cowboy    Please return to the emergency room right away if you are to develop a fever, severe nausea, your pain becomes severe or worsens, you are unable to keep food down, begin vomiting any dark or bloody fluid, you develop any dark or bloody stools, feel dehydrated, or other new concerns or symptoms arise.

## 2019-07-17 NOTE — ED Provider Notes (Signed)
[facility] Emergency Department Provider Note   ____________________________________________   First MD Initiated Contact with Patient 07/17/19 [phone]     (approximate)  I have reviewed the triage vital signs and the nursing notes.   HISTORY  Chief Complaint Abdominal Pain    HPI [name] is a 54 y.o. male here for evaluation for about 2 months of intermittent abdominal discomfort  Patient reports that whenever he eats he feels like food just goes straight through him for the last 2 months.  He intermittently will experience feelings of a little bit of decreased appetite, nausea.  Reports has been ongoing for at least 2 months now.  Says it happened about the same time he started medication for calciphylaxis  He has a plan to see Dr. 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  No chest pain no shortness of breath.  Denies black or bloody stool.  He is not vomiting.  He reports he does okay but anytime he eats that he is hungry, it just seems to want to go right through him.  He has been losing some weight over the last couple of months as well.  Denies having any abdominal pain.  Reports at times will feel little bit of discomfort, but the present time he having no pain or discomfort  Denies exposure to Covid.  He is a dialysis patient, he is due for dialysis at 1230 today at his [facility] kidney center  Denies shortness of breath or swelling.   Past Medical History:  Diagnosis Date  . Amputee, below knee, left ([place])   . Anemia   . Blood transfusion   . Bronchitis   . Chest pain   . CHF (congestive heart failure) ([place])   . Chronic kidney disease    dialysis, M[facility]W-F, [facility]  . Diabetes mellitus    type 2  . Dysrhythmia    irregular heartbeat  . ESRD (end stage renal disease) on dialysis ([place])    first HD 06/19/04  . GERD (gastroesophageal reflux disease)   .  Hyperparathyroidism ([place])   . Morbid obesity [facility])     Patient Active Problem List   Diagnosis Date Noted  . Bacteremia 07/13/2019  . Endocarditis 07/13/2019  . Hiatal hernia 07/13/2019  . Pneumonia 07/13/2019  . Open wound of hip and thigh, complicated 79/48/0165  . Gout 06/01/2019  . 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 Cigarette smoker 06/01/2019  . Bacterial infection, unspecified 02/08/2019  . Infection of AV graft for dialysis ([place]) 09/28/2018  . Unspecified disorder of calcium metabolism 09/09/2018  . Headache 06/22/2018  . Nicotine dependence, other tobacco product, uncomplicated 53/74/8270  . Status post ankle fusion 05/23/2016  . Other fluid overload 01/19/2016  . Sepsis due to methicillin resistant Staphylococcus aureus ([place]) 08/21/2015  . MRSA bacteremia   . Esophageal reflux   . Infection and inflammatory reaction due to cardiac device, implant, and graft ([place])   . Encounter for removal of sutures 05/08/2015  . Pruritus, unspecified 05/08/2015  . Diabetes mellitus type 2, uncontrolled ([place]) 04/18/2015  . Status post below-knee amputation of left lower extremity ([place]) 04/17/2015  . Acquired absence of left leg below knee ([place]) 04/15/2015  . Other osteomyelitis, ankle and foot ([place]) 01/13/2015  . Type 2 diabetes mellitus without complications ([place]) 78/67/5449  . Aftercare including intermittent dialysis ([place]) 06/16/2014  . Mild protein-calorie malnutrition ([place]) 12/28/2013  . Fever, unspecified 12/15/2013  .  DVT (deep venous thrombosis) ([place]) 12/13/2013  . Coagulation defect, unspecified ([place]) 12/08/2013  . Type 2 diabetes mellitus with diabetic peripheral angiopathy without gangrene ([place]) 12/06/2013  . Diarrhea, unspecified 11/12/2013  . 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 Charcot's joint, unspecified site 07/14/2013  . Hypoparathyroidism, unspecified ([place]) 07/14/2013  . ESRD on dialysis ([place]) 10/23/2012  .  Pre-transplant evaluation for chronic kidney disease 01/30/2012  . Other iron deficiency anemias 09/26/2011  . Thrombocytopenia, unspecified ([place]) 02/08/2011  . Secondary hyperparathyroidism of renal origin ([place]) 10/11/2009  . DIASTOLIC DYSFUNCTION 09/47/0962  . PVD (peripheral vascular disease) ([place]) 10/22/2007  . Anemia of chronic kidney failure 10/22/2007  . HYPERPARATHYROIDISM, HX OF 10/22/2007  . Other specified diabetes mellitus with diabetic neuropathy, unspecified ([place]) 12/19/2006  . Other cardiomyopathies ([place]) 06/02/2006  . Essential hypertension 11/30/2004  . End-stage renal disease on hemodialysis ([place]) 06/19/2004    Past Surgical History:  Procedure Laterality Date  . A/V SHUNTOGRAM Left 09/30/2018   Procedure: A/V SHUNTOGRAM;  Surgeon: Algernon Huxley, MD;  Location: [place] [facility];  Service: Cardiovascular;  Laterality: Left;  . AMPUTATION Left 01/20/2015   Procedure: Left Great Toe Amputation;  Surgeon: Newt Minion, MD;  Location: [place];  Service: Orthopedics;  Laterality: Left;  . AMPUTATION Left 03/07/2015   Procedure: Left Foot 1st and 2nd Ray Amputation;  Surgeon: Newt Minion, MD;  Location: [place];  Service: Orthopedics;  Laterality: Left;  . AMPUTATION Left 04/14/2015   Procedure: LEFT BELOW THE KNEE AMPUTATION ;  Surgeon: Newt Minion, MD;  Location: [place];  Service: Orthopedics;  Laterality: Left;  . ANKLE FUSION     rt foot charcot joint 2008 with refusion  . AV FISTULA PLACEMENT  10/11/2010  . AV FISTULA PLACEMENT  07/02/2011   Procedure: INSERTION OF ARTERIOVENOUS (AV) GORE-TEX GRAFT ARM;  Surgeon: Elam Dutch, MD;  Location: [place];  Service: Vascular;  Laterality: Left;  . AV FISTULA PLACEMENT Right 11/06/2012   Procedure: INSERTION OF ARTERIOVENOUS (AV) GORE-TEX GRAFT ARM;  Surgeon: Mal Misty, MD;  Location: [place];  Service: Vascular;  Laterality: Right;  . AV FISTULA PLACEMENT Right 03/21/2016   Procedure: INSERTION OF ARTERIOVENOUS (AV) GORE-TEX  GRAFT ARM ( LOOP FOREARM );  Surgeon: Algernon Huxley, MD;  Location: [facility];  Service: Vascular;  Laterality: Right;  . AV FISTULA PLACEMENT Left 11/07/2016   Procedure: INSERTION OF ARTERIOVENOUS (AV) Artegraft;  Surgeon: Algernon Huxley, MD;  Location: [facility];  Service: Vascular;  Laterality: Left;  . AV FISTULA PLACEMENT Left 10/01/2018   Procedure: INSERTION OF ARTERIOVENOUS (AV) GORE-TEX GRAFT THIGH REVISION;  Surgeon: Algernon Huxley, MD;  Location: [facility];  Service: Vascular;  Laterality: Left;  . [place] REMOVAL Right 11/06/2012   Procedure: REMOVAL OF ARTERIOVENOUS GORETEX GRAFT ([place]);  Surgeon: Mal Misty, MD;  Location: [place];  Service: Vascular;  Laterality: Right;  . DIALYSIS/PERMA CATHETER INSERTION N/A 10/05/2018   Procedure: DIALYSIS/PERMA CATHETER INSERTION;  Surgeon: Algernon Huxley, MD;  Location: [place] [facility];  Service: Cardiovascular;  Laterality: N/A;  . ENDARTERECTOMY FEMORAL  11/07/2016   Procedure: ENDARTERECTOMY FEMORAL;  Surgeon: Algernon Huxley, MD;  Location: [facility];  Service: Vascular;;  . HERNIA REPAIR     Umbilical Hernia  . INSERTION OF DIALYSIS CATHETER  01/20/2012   Procedure: INSERTION OF DIALYSIS CATHETER;  Surgeon: Serafina Mitchell, MD;  Location: [place];  Service: Vascular;  Laterality:  N/A;  insertion left internal jugular dialysis catheter 23cm  . INSERTION OF DIALYSIS CATHETER Right 11/09/2012   Procedure: INSERTION OF DIALYSIS CATHETER;  Surgeon: Conrad Scotland, MD;  Location: [place];  Service: Vascular;  Laterality: Right;  Right  Femoral Vein  . INSERTION OF DIALYSIS CATHETER N/A 08/18/2015   Procedure: INSERTION OF DIALYSIS CATHETER RIGHT FEMORAL VEIN;  Surgeon: Mal Misty, MD;  Location: [place];  Service: Cardiovascular;  Laterality: N/A;  . PARATHYROIDECTOMY    . PERIPHERAL VASCULAR CATHETERIZATION Right 04/04/2016   Procedure: A/V Shuntogram/Fistulagram;  Surgeon: Algernon Huxley, MD;  Location: [place] [facility];  Service: Cardiovascular;  Laterality: Right;    . REMOVAL OF A HERO DEVICE Left 08/14/2015   Procedure: REMOVAL OF LEFT ARM HERO DEVICE;  Surgeon: Rosetta Posner, MD;  Location: [place];  Service: Vascular;  Laterality: Left;  . REMOVAL OF GRAFT Left 11/05/2018   Procedure: I&D WITH GRAFT EXCISION;  Surgeon: Algernon Huxley, MD;  Location: [facility];  Service: Vascular;  Laterality: Left;  . TEE WITHOUT CARDIOVERSION N/A 08/18/2015   Procedure: TRANSESOPHAGEAL ECHOCARDIOGRAM (TEE);  Surgeon: Mal Misty, MD;  Location: [place];  Service: Cardiovascular;  Laterality: N/A;  . TEE WITHOUT CARDIOVERSION N/A 12/21/2015   Procedure: TRANSESOPHAGEAL ECHOCARDIOGRAM (TEE);  Surgeon: Dixie Dials, MD;  Location: [place];  Service: Cardiovascular;  Laterality: N/A;  . TEMPORARY DIALYSIS CATHETER N/A 09/29/2018   Procedure: TEMPORARY DIALYSIS CATHETER INSERTION;  Surgeon: Katha Cabal, MD;  Location: [place] [facility];  Service: Cardiovascular;  Laterality: N/A;  . THROMBECTOMY    . Umbilcial Hernia    . UPPER EXTREMITY VENOGRAPHY Left 10/31/2016   Procedure: Upper Extremity Venography;  Surgeon: Algernon Huxley, MD;  Location: [place] [facility];  Service: Cardiovascular;  Laterality: Left;  Marland Kitchen VENOGRAM N/A 01/06/2014   Procedure: VENOGRAM;  Surgeon: Conrad , MD;  Location: [facility];  Service: Cardiovascular;  Laterality: N/A;    Prior to Admission medications   Medication Sig Start Date End Date Taking? Authorizing Provider  acetaminophen (TYLENOL) 500 MG tablet Take 1,000 mg by mouth as needed for moderate pain (takes everytime with dialysis).    [provider]  aspirin EC 81 MG EC tablet Take 1 tablet (81 mg total) by mouth daily. 08/19/15   Burns, Arloa Koh, MD  AURYXIA 1 GM 210 MG(Fe) tablet Take 1 tablet by mouth 3 (three) times daily with meals. 10/24/16   [provider]  b complex-vitamin c-folic acid (NEPHRO-VITE) 0.8 MG TABS tablet Take 1 tablet by mouth at bedtime. 04/26/15   Angiulli, Lavon Paganini, PA-C  Blood Glucose  Monitoring Suppl (ONE TOUCH ULTRA 2) w/Device KIT U D 03/23/19   [provider]  cyclobenzaprine (FLEXERIL) 5 MG tablet Take 5 mg by mouth as needed for muscle spasms.    [provider]  Dulaglutide (TRULICITY) 1.75 ZW/2.5EN SOPN Inject 0.75 mg into the skin every Friday.     [provider]  heparin 1000 UNIT/ML injection Heparin Sodium (Porcine) 1,000 Units/mL Catheter Lock Arterial 10/07/18 10/06/19  [provider]  insulin detemir (LEVEMIR) 100 UNIT/ML injection Inject 0.2 mLs (20 Units total) into the skin at bedtime. Patient taking differently: Inject 4-20 Units into the skin at bedtime. Per sliding scale 04/26/15   Angiulli, Lavon Paganini, PA-C  Methoxy PEG-Epoetin Beta (MIRCERA) 100 MCG/0.3ML Leandro Reasoner 03/19/19 03/17/20  [provider]  NOVOLOG FLEXPEN 100 UNIT/ML FlexPen 2-3 Units as needed for high  blood sugar. Per sliding scale 12/04/16   [provider]  ondansetron (ZOFRAN) 4 MG tablet Take by mouth. 03/29/19   [provider]  oxyCODONE-acetaminophen (PERCOCET) 7.5-325 MG tablet Take 1 tablet by mouth every 4 (four) hours as needed for severe pain. 11/05/18   Algernon Huxley, MD  pantoprazole (PROTONIX) 40 MG tablet Take 40 mg by mouth daily. 03/30/18   [provider]  RENVELA 800 MG tablet Take 2,400 mg by mouth 3 (three) times daily. 03/11/19   [provider]  VELPHORO 500 MG chewable tablet Chew 500 mg by mouth 3 (three) times daily. 06/08/19   [provider]  VENTOLIN HFA 108 (90 Base) MCG/ACT inhaler Inhale 2 puffs into the lungs as needed for wheezing.  11/06/16   [provider]  Vitamin E 100 UNIT/GM CREA Take by mouth. 03/24/19   [provider]    Allergies Hydrocodone, Dilaudid [hydromorphone hcl], Fentanyl, Amlodipine, Dacarbazine, Propofol, and Hydrocodone-acetaminophen  Family History  Problem Relation Age of Onset  . Heart disease Mother   . Kidney disease Mother   .  Hypertension Mother     Social History Social History   Tobacco Use  . Smoking status: Current Every Day Smoker    Packs/day: 0.25    Years: 20.00    Pack years: 5.00    Types: Cigarettes  . Smokeless tobacco: Never Used  Substance Use Topics  . Alcohol use: No    Alcohol/week: 0.0 standard drinks  . Drug use: Yes    Comment: oxycodone prescribed    Review of Systems Constitutional: No fever/chills Eyes: No visual changes. ENT: No sore throat. Cardiovascular: Denies chest pain. Respiratory: Denies shortness of breath. Gastrointestinal: See HPI Genitourinary: He does not make any urine chronically Musculoskeletal: Negative for back pain. Skin: Negative for rash except over his left upper thigh and spots of calciphylaxis, reports the thigh has been monitored closely by vascular surgery has an appointment coming up in a few days for debridement, it has not changed or worsened and he checks the bandage every day. Neurological: Negative for headaches, areas of focal weakness or numbness.    ____________________________________________   PHYSICAL EXAM:  VITAL SIGNS: ED Triage Vitals  Enc Vitals Group     BP 07/17/19 0751 114/83     Pulse Rate 07/17/19 0751 89     Resp 07/17/19 0751 16     Temp 07/17/19 0751 98.4 F (36.9 C)     Temp Source 07/17/19 0751 Oral     SpO2 07/17/19 0751 100 %     Weight 07/17/19 0752 200 lb (90.7 kg)     Height 07/17/19 0752 6' (1.829 m)     Head Circumference --      Peak Flow --      Pain Score 07/17/19 0752 10     Pain Loc --      Pain Edu? --      Excl. in [place]? --     Constitutional: Alert and oriented. Well appearing and in no acute distress.  Chronic illness, but in no distress.  Does not appear disheveled or severely underweight Eyes: Conjunctivae are normal. Head: Atraumatic. Nose: No congestion/rhinnorhea. Mouth/Throat: Mucous membranes are moist. Neck: No stridor.  Cardiovascular: Normal rate, regular rhythm. Grossly  normal heart sounds.  Good peripheral circulation. Respiratory: Normal respiratory effort.  No retractions. Lungs CTAB. Gastrointestinal: Soft and nontender. No distention.  Denies pain to palpation in any quadrant. Musculoskeletal: No lower extremity tenderness nor edema.  Left lower extremity amputation.  He has bandages over the left upper thigh, he does have wounds that appear consistent with some calciphylaxis without purulence surrounding erythema or evidence of abscess formation Neurologic:  Normal speech and language. No gross focal neurologic deficits are appreciated.  Skin:  Skin is warm, dry and intact. No rash noted. Psychiatric: Mood and affect are normal. Speech and behavior are normal.  ____________________________________________   LABS (all labs ordered are listed, but only abnormal results are displayed)  Labs Reviewed  COMPREHENSIVE METABOLIC PANEL - Abnormal; Notable for the following components:      Result Value   Chloride 83 (*)    Glucose, Bld 233 (*)    BUN 90 (*)    Creatinine, Ser 15.45 (*)    Calcium 8.0 (*)    Albumin 2.6 (*)    AST 14 (*)    GFR calc non Af Amer 3 (*)    GFR calc Af Amer 4 (*)    Anion gap 31 (*)    All other components within normal limits  CBC - Abnormal; Notable for the following components:   RBC 3.22 (*)    Hemoglobin 8.2 (*)    HCT 26.2 (*)    MCH 25.5 (*)    RDW 19.0 (*)    All other components within normal limits  LIPASE, BLOOD  URINALYSIS, COMPLETE (UACMP) WITH MICROSCOPIC   ____________________________________________  EKG  Reviewed enterotomy at 830 Heart rate 99 QRS 100 QTc 500 Normal sinus rhythm, slight prolongation of QT interval ____________________________________________  RADIOLOGY  CT ABDOMEN PELVIS WO CONTRAST  Result Date: 07/17/2019 CLINICAL DATA:  Generalized abdominal pain for 2 months. EXAM: CT ABDOMEN AND PELVIS WITHOUT CONTRAST TECHNIQUE: Multidetector CT imaging of the abdomen and pelvis was  performed following the standard protocol without IV contrast. COMPARISON:  None. FINDINGS: Lower chest: No acute abnormality. Hepatobiliary: No focal liver abnormality is seen. No gallstones, gallbladder wall thickening, or biliary dilatation. Pancreas: Unremarkable. No pancreatic ductal dilatation or surrounding inflammatory changes. Spleen: Normal in size without focal abnormality. Adrenals/Urinary Tract: Adrenal glands appear normal. Innumerable cysts of varying sizes are seen involving both kidneys, some of which are calcified peripherally. This is consistent with polycystic kidney disease. One peripherally calcified rounded abnormality is seen in lower pole of left kidney which measures 3.7 cm and is either isodense are hyperdense. It most likely represents hyperdense or hemorrhagic cyst, but ultrasound is recommended to rule out neoplasm or mass. No hydronephrosis or renal obstruction is noted. Urinary bladder is decompressed. Stomach/Bowel: Stomach is within normal limits. Appendix appears normal. No evidence of bowel wall thickening, distention, or inflammatory changes. Vascular/Lymphatic: Aortic atherosclerosis. No enlarged abdominal or pelvic lymph nodes. Reproductive: Prostate is unremarkable. Other: No abdominal wall hernia or abnormality. No abdominopelvic ascites. Musculoskeletal: Increased sclerosis is seen involving the visualized skeleton consistent with renal osteodystrophy. IMPRESSION: 1. Findings consistent with polycystic kidney disease. 2. Aortic atherosclerosis. 3. Increased sclerosis is seen involving the visualized skeleton consistent with renal osteodystrophy. 4. 3.7 cm peripherally calcified rounded abnormality seen in lower pole of left kidney most consistent with hyperdense or hemorrhagic cyst, but renal ultrasound is recommended to rule out mass. Aortic Atherosclerosis (ICD10-I70.0). Electronically Signed   By: Marijo Conception M.D.   On: 07/17/2019 09:36    Discussed results with  the patient, also discussed with his physician Dr. Jonnie Finner.  He advised patient may follow-up with him in primary care in [place] ____________________________________________   PROCEDURES  Procedure(s) performed: None  Procedures  Critical Care performed: No  ____________________________________________   INITIAL IMPRESSION / ASSESSMENT AND PLAN / ED COURSE  Pertinent labs & imaging results that were available during my care of the patient were reviewed by me and considered in my medical decision making (see chart for details).   Patient presents for evaluation of symptoms of stomach upset, some nausea loosening of stool for about 2 months time.  Reports it started on same time as he started medicine for calciphylaxis.  He is very alert stable without distress.  I do not have a high suspicion for acute abdomen, but he does have multiple chronic medical conditions.  Discussed with the patient, and given his chronicity of symptoms he would like to proceed with further imaging studies.  Given his dialysis needs, and his discussion preference not to use contrast we will proceed with a noncontrasted study I do not have a high suspicion for a vascular cause  Note review of CT is reassuring, discussed with the patient a possible cyst in the left kidney, he can follow-up with his nephrologist for this night have discussed his case with Dr. Burnett Sheng his nephrologist in [place].  He advises that patient's sodium sodium thiosulfate is likely not cause of his symptoms but suspects instead that inflammation related to his active calciphylaxis is probably also contributed GI upset.  This may be possible, I discussed with the patient, patient is comfortable plan for discharge, he will have his dialysis session today, and follow-up closely with his primary and nephrologist further.  I did offer that we could provide him additional medications for antiemetic nausea etc., but patient reports he has lots of  medicine does not feel he needs any additional prescriptions just wanted to have reassurance that there is not some acute going on  No evidence of infection.  Return precautions and treatment recommendations and follow-up discussed with the patient who is agreeable with the plan.  No Covid-like symptoms  [name] was evaluated in Emergency Department on 07/17/2019 for the symptoms described in the history of present illness. He was evaluated in the context of the global COVID-19 pandemic, which necessitated consideration that the patient might be at risk for infection with the SARS-CoV-2 virus that causes COVID-19. Institutional protocols and algorithms that pertain to the evaluation of patients at risk for COVID-19 are in a state of rapid change based on information released by regulatory bodies including the CDC and federal and state organizations. These policies and algorithms were followed during the patient's care in the ED.      ____________________________________________   FINAL CLINICAL IMPRESSION(S) / ED DIAGNOSES  Final diagnoses:  Cyst of left kidney  Stomach upset  ESRD (end stage renal disease) ([place])        Note:  This document was prepared using Dragon voice recognition software and may include unintentional dictation errors       Delman Kitten, MD 07/17/19 1026

## 2019-07-17 NOTE — ED Triage Notes (Signed)
Patient to ED from home via [facility] c/o abdominal pain. Per EMS patient reports abdominal pain for the past 2 months that has become increasingly worse because of a "medicine that [he] can not remember what it is called". Patient is A&Ox4 and reports NVD and poor appetite since being on the medication.

## 2019-07-18 ENCOUNTER — Other Ambulatory Visit (INDEPENDENT_AMBULATORY_CARE_PROVIDER_SITE_OTHER): Payer: Self-pay | Admitting: Nurse Practitioner

## 2019-07-19 ENCOUNTER — Other Ambulatory Visit
Admission: RE | Admit: 2019-07-19 | Discharge: 2019-07-19 | Disposition: A | Discharge status: Home / Self Care | Payer: Medicare Other | Source: Ambulatory Visit | Attending: Vascular Surgery | Admitting: Vascular Surgery

## 2019-07-19 DIAGNOSIS — Z01812 Encounter for preprocedural laboratory examination: Secondary | ICD-10-CM | POA: Diagnosis present

## 2019-07-19 DIAGNOSIS — Z20822 Contact with and (suspected) exposure to covid-19: Secondary | ICD-10-CM | POA: Diagnosis not present

## 2019-07-19 LAB — SARS CORONAVIRUS 2 (TAT 6-24 HRS): SARS Coronavirus 2: NEGATIVE

## 2019-07-19 NOTE — Progress Notes (Signed)
Came for covid testing today and stated that he did not have the time to get lab work done. Was instructed to call the surgeons office to possibly have it done at another time prior to surgery.

## 2019-07-20 ENCOUNTER — Telehealth (INDEPENDENT_AMBULATORY_CARE_PROVIDER_SITE_OTHER): Payer: Self-pay

## 2019-07-20 NOTE — Telephone Encounter (Signed)
I have attempted to have home health come in for wound vac changes for the patient who is scheduled to have a surgery for wound debridement and vac placement on 07/21/19. The information has been [facility]ed to [facility] and now [facility]. [facility] cannot take the patient due to the type of insurance. Still awaiting word from [facility].

## 2019-07-21 ENCOUNTER — Ambulatory Visit: Payer: Medicare Other | Admitting: Anesthesiology

## 2019-07-21 ENCOUNTER — Encounter
Admission: RE | Disposition: A | Discharge status: Home / Self Care | Payer: Self-pay | Source: Home / Self Care | Attending: Vascular Surgery

## 2019-07-21 ENCOUNTER — Ambulatory Visit
Admission: RE | Admit: 2019-07-21 | Discharge: 2019-07-21 | Disposition: A | Discharge status: Home / Self Care | Payer: Medicare Other | Attending: Vascular Surgery | Admitting: Vascular Surgery

## 2019-07-21 ENCOUNTER — Encounter: Payer: Self-pay | Admitting: Vascular Surgery

## 2019-07-21 ENCOUNTER — Other Ambulatory Visit: Payer: Self-pay

## 2019-07-21 DIAGNOSIS — K219 Gastro-esophageal reflux disease without esophagitis: Secondary | ICD-10-CM | POA: Insufficient documentation

## 2019-07-21 DIAGNOSIS — L97125 Non-pressure chronic ulcer of left thigh with muscle involvement without evidence of necrosis: Secondary | ICD-10-CM

## 2019-07-21 DIAGNOSIS — R519 Headache, unspecified: Secondary | ICD-10-CM | POA: Diagnosis not present

## 2019-07-21 DIAGNOSIS — N186 End stage renal disease: Secondary | ICD-10-CM | POA: Insufficient documentation

## 2019-07-21 DIAGNOSIS — E1122 Type 2 diabetes mellitus with diabetic chronic kidney disease: Secondary | ICD-10-CM | POA: Diagnosis not present

## 2019-07-21 DIAGNOSIS — X58XXXA Exposure to other specified factors, initial encounter: Secondary | ICD-10-CM | POA: Insufficient documentation

## 2019-07-21 DIAGNOSIS — E1151 Type 2 diabetes mellitus with diabetic peripheral angiopathy without gangrene: Secondary | ICD-10-CM | POA: Diagnosis not present

## 2019-07-21 DIAGNOSIS — F17210 Nicotine dependence, cigarettes, uncomplicated: Secondary | ICD-10-CM | POA: Diagnosis not present

## 2019-07-21 DIAGNOSIS — Z8249 Family history of ischemic heart disease and other diseases of the circulatory system: Secondary | ICD-10-CM | POA: Insufficient documentation

## 2019-07-21 DIAGNOSIS — D631 Anemia in chronic kidney disease: Secondary | ICD-10-CM | POA: Insufficient documentation

## 2019-07-21 DIAGNOSIS — I132 Hypertensive heart and chronic kidney disease with heart failure and with stage 5 chronic kidney disease, or end stage renal disease: Secondary | ICD-10-CM | POA: Diagnosis not present

## 2019-07-21 DIAGNOSIS — T8189XA Other complications of procedures, not elsewhere classified, initial encounter: Secondary | ICD-10-CM | POA: Insufficient documentation

## 2019-07-21 DIAGNOSIS — Z992 Dependence on renal dialysis: Secondary | ICD-10-CM | POA: Insufficient documentation

## 2019-07-21 DIAGNOSIS — Z89512 Acquired absence of left leg below knee: Secondary | ICD-10-CM | POA: Insufficient documentation

## 2019-07-21 DIAGNOSIS — Z841 Family history of disorders of kidney and ureter: Secondary | ICD-10-CM | POA: Insufficient documentation

## 2019-07-21 DIAGNOSIS — Z885 Allergy status to narcotic agent status: Secondary | ICD-10-CM | POA: Diagnosis not present

## 2019-07-21 DIAGNOSIS — K449 Diaphragmatic hernia without obstruction or gangrene: Secondary | ICD-10-CM | POA: Insufficient documentation

## 2019-07-21 DIAGNOSIS — I509 Heart failure, unspecified: Secondary | ICD-10-CM | POA: Insufficient documentation

## 2019-07-21 DIAGNOSIS — Z888 Allergy status to other drugs, medicaments and biological substances status: Secondary | ICD-10-CM | POA: Diagnosis not present

## 2019-07-21 DIAGNOSIS — E213 Hyperparathyroidism, unspecified: Secondary | ICD-10-CM | POA: Insufficient documentation

## 2019-07-21 HISTORY — PX: WOUND DEBRIDEMENT: SHX247

## 2019-07-21 HISTORY — PX: APPLICATION OF WOUND VAC: SHX5189

## 2019-07-21 LAB — POCT I-STAT, CHEM 8
BUN: 66 mg/dL — ABNORMAL HIGH (ref 6–20)
Calcium, Ion: 0.97 mmol/L — ABNORMAL LOW (ref 1.15–1.40)
Chloride: 93 mmol/L — ABNORMAL LOW (ref 98–111)
Creatinine, Ser: 11.8 mg/dL — ABNORMAL HIGH (ref 0.61–1.24)
Glucose, Bld: 166 mg/dL — ABNORMAL HIGH (ref 70–99)
HCT: 29 % — ABNORMAL LOW (ref 39.0–52.0)
Hemoglobin: 9.9 g/dL — ABNORMAL LOW (ref 13.0–17.0)
Potassium: 3.5 mmol/L (ref 3.5–5.1)
Sodium: 137 mmol/L (ref 135–145)
TCO2: 28 mmol/L (ref 22–32)

## 2019-07-21 LAB — GLUCOSE, CAPILLARY: Glucose-Capillary: 169 mg/dL — ABNORMAL HIGH (ref 70–99)

## 2019-07-21 LAB — TYPE AND SCREEN
ABO/RH(D): A POS
Antibody Screen: NEGATIVE

## 2019-07-21 LAB — PROTIME-INR
INR: 1.3 — ABNORMAL HIGH (ref 0.8–1.2)
Prothrombin Time: 15.6 seconds — ABNORMAL HIGH (ref 11.4–15.2)

## 2019-07-21 LAB — APTT: aPTT: 41 seconds — ABNORMAL HIGH (ref 24–36)

## 2019-07-21 SURGERY — DEBRIDEMENT, WOUND
Anesthesia: General | Site: Thigh | Laterality: Left

## 2019-07-21 MED ORDER — CEFAZOLIN SODIUM-DEXTROSE 1-4 GM/50ML-% IV SOLN
1.0000 g | INTRAVENOUS | Status: AC
  Administered 2019-07-21: 1 g via INTRAVENOUS

## 2019-07-21 MED ORDER — CHLORHEXIDINE GLUCONATE CLOTH 2 % EX PADS
6.0000 | MEDICATED_PAD | Freq: Once | CUTANEOUS | Status: AC
  Administered 2019-07-21: 6 via TOPICAL

## 2019-07-21 MED ORDER — ONDANSETRON HCL 4 MG/2ML IJ SOLN
INTRAMUSCULAR | Status: AC
  Filled 2019-07-21: qty 2

## 2019-07-21 MED ORDER — PROPOFOL 10 MG/ML IV BOLUS
INTRAVENOUS | Status: AC
  Filled 2019-07-21: qty 20

## 2019-07-21 MED ORDER — MIDAZOLAM HCL 2 MG/2ML IJ SOLN
INTRAMUSCULAR | Status: AC
  Filled 2019-07-21: qty 2

## 2019-07-21 MED ORDER — ONDANSETRON HCL 4 MG/2ML IJ SOLN: 4.0000 mg | Freq: Once | INTRAMUSCULAR | Status: DC | PRN

## 2019-07-21 MED ORDER — BUPIVACAINE HCL (PF) 0.5 % IJ SOLN
INTRAMUSCULAR | Status: AC
  Filled 2019-07-21: qty 30

## 2019-07-21 MED ORDER — LIDOCAINE HCL (CARDIAC) PF 100 MG/5ML IV SOSY
PREFILLED_SYRINGE | INTRAVENOUS | Status: DC | PRN
  Administered 2019-07-21: 100 mg via INTRAVENOUS

## 2019-07-21 MED ORDER — FENTANYL CITRATE (PF) 100 MCG/2ML IJ SOLN
INTRAMUSCULAR | Status: AC
  Filled 2019-07-21: qty 2

## 2019-07-21 MED ORDER — OXYCODONE-ACETAMINOPHEN 7.5-325 MG PO TABS: 1.0000 | ORAL_TABLET | ORAL | 0 refills | Status: AC | PRN

## 2019-07-21 MED ORDER — OXYCODONE-ACETAMINOPHEN 7.5-325 MG PO TABS: 1.0000 | ORAL_TABLET | Freq: Once | ORAL | Status: AC

## 2019-07-21 MED ORDER — FENTANYL CITRATE (PF) 100 MCG/2ML IJ SOLN
INTRAMUSCULAR | Status: DC | PRN
  Administered 2019-07-21: 25 ug via INTRAVENOUS
  Administered 2019-07-21: 50 ug via INTRAVENOUS
  Administered 2019-07-21: 25 ug via INTRAVENOUS

## 2019-07-21 MED ORDER — SODIUM CHLORIDE 0.9 % IV SOLN
INTRAVENOUS | Status: DC | PRN
  Administered 2019-07-21: 40 ug/min via INTRAVENOUS

## 2019-07-21 MED ORDER — FENTANYL CITRATE (PF) 100 MCG/2ML IJ SOLN
INTRAMUSCULAR | Status: AC
  Administered 2019-07-21: 25 ug via INTRAVENOUS
  Filled 2019-07-21: qty 2

## 2019-07-21 MED ORDER — PROPOFOL 10 MG/ML IV BOLUS
INTRAVENOUS | Status: DC | PRN
  Administered 2019-07-21: 150 mg via INTRAVENOUS
  Administered 2019-07-21: 50 mg via INTRAVENOUS

## 2019-07-21 MED ORDER — CEFAZOLIN SODIUM-DEXTROSE 1-4 GM/50ML-% IV SOLN
INTRAVENOUS | Status: AC
  Filled 2019-07-21: qty 50

## 2019-07-21 MED ORDER — MIDAZOLAM HCL 2 MG/2ML IJ SOLN
INTRAMUSCULAR | Status: DC | PRN
  Administered 2019-07-21: 2 mg via INTRAVENOUS

## 2019-07-21 MED ORDER — LIDOCAINE HCL (PF) 1 % IJ SOLN
INTRAMUSCULAR | Status: AC
  Filled 2019-07-21: qty 30

## 2019-07-21 MED ORDER — LIDOCAINE HCL (PF) 2 % IJ SOLN
INTRAMUSCULAR | Status: AC
  Filled 2019-07-21: qty 5

## 2019-07-21 MED ORDER — ONDANSETRON HCL 4 MG/2ML IJ SOLN
INTRAMUSCULAR | Status: DC | PRN
  Administered 2019-07-21: 4 mg via INTRAVENOUS

## 2019-07-21 MED ORDER — DEXMEDETOMIDINE HCL IN NACL 80 MCG/20ML IV SOLN
INTRAVENOUS | Status: AC
  Filled 2019-07-21: qty 20

## 2019-07-21 MED ORDER — SODIUM CHLORIDE 0.9 % IV SOLN: INTRAVENOUS | Status: DC

## 2019-07-21 MED ORDER — DEXMEDETOMIDINE HCL 200 MCG/2ML IV SOLN
INTRAVENOUS | Status: DC | PRN
  Administered 2019-07-21: 4 ug via INTRAVENOUS
  Administered 2019-07-21: 12 ug via INTRAVENOUS

## 2019-07-21 MED ORDER — OXYCODONE-ACETAMINOPHEN 7.5-325 MG PO TABS
ORAL_TABLET | ORAL | Status: AC
  Administered 2019-07-21: 1 via ORAL
  Filled 2019-07-21: qty 1

## 2019-07-21 MED ORDER — PHENYLEPHRINE HCL (PRESSORS) 10 MG/ML IV SOLN
INTRAVENOUS | Status: DC | PRN
  Administered 2019-07-21 (×4): 200 ug via INTRAVENOUS
  Administered 2019-07-21: 100 ug via INTRAVENOUS

## 2019-07-21 MED ORDER — FENTANYL CITRATE (PF) 100 MCG/2ML IJ SOLN
25.0000 ug | INTRAMUSCULAR | Status: AC | PRN
  Administered 2019-07-21 (×6): 25 ug via INTRAVENOUS

## 2019-07-21 SURGICAL SUPPLY — 47 items
APL PRP STRL LF DISP 70% ISPRP (MISCELLANEOUS) ×2
BLADE SURG 15 STRL LF DISP TIS (BLADE) IMPLANT
BLADE SURG 15 STRL SS (BLADE) ×6
BLADE SURG SZ10 CARB STEEL (BLADE) ×4 IMPLANT
BNDG ELASTIC 2X5.8 VLCR STR LF (GAUZE/BANDAGES/DRESSINGS) ×2 IMPLANT
BNDG ELASTIC 6X5.8 VLCR STR LF (GAUZE/BANDAGES/DRESSINGS) ×2 IMPLANT
BRUSH SCRUB EZ  4% CHG (MISCELLANEOUS)
BRUSH SCRUB EZ 4% CHG (MISCELLANEOUS) ×1 IMPLANT
CANISTER SUCT 1200ML W/VALVE (MISCELLANEOUS) ×3 IMPLANT
CANISTER WOUND CARE 500ML ATS (WOUND CARE) ×3 IMPLANT
CHLORAPREP W/TINT 26 (MISCELLANEOUS) ×5 IMPLANT
COVER WAND RF STERILE (DRAPES) ×3 IMPLANT
DRAPE INCISE IOBAN 66X45 STRL (DRAPES) ×5 IMPLANT
DRAPE LAPAROTOMY 100X77 ABD (DRAPES) ×3 IMPLANT
DRSG VAC ATS LRG SENSATRAC (GAUZE/BANDAGES/DRESSINGS) ×1 IMPLANT
DRSG VAC ATS MED SENSATRAC (GAUZE/BANDAGES/DRESSINGS) ×3 IMPLANT
ELECT CAUTERY BLADE 6.4 (BLADE) ×3 IMPLANT
ELECT REM PT RETURN 9FT ADLT (ELECTROSURGICAL) ×3
ELECTRODE REM PT RTRN 9FT ADLT (ELECTROSURGICAL) ×1 IMPLANT
GLOVE BIO SURGEON STRL SZ7 (GLOVE) ×6 IMPLANT
GLOVE BIOGEL PI IND STRL 7.5 (GLOVE) ×1 IMPLANT
GLOVE BIOGEL PI INDICATOR 7.5 (GLOVE) ×2
GOWN STRL REUS W/ TWL LRG LVL3 (GOWN DISPOSABLE) ×2 IMPLANT
GOWN STRL REUS W/ TWL XL LVL3 (GOWN DISPOSABLE) ×1 IMPLANT
GOWN STRL REUS W/TWL LRG LVL3 (GOWN DISPOSABLE) ×6
GOWN STRL REUS W/TWL XL LVL3 (GOWN DISPOSABLE) ×3
IV NS 1000ML (IV SOLUTION) ×3
IV NS 1000ML BAXH (IV SOLUTION) ×1 IMPLANT
KIT TURNOVER KIT A (KITS) ×3 IMPLANT
LABEL OR SOLS (LABEL) ×3 IMPLANT
NS IRRIG 1000ML POUR BTL (IV SOLUTION) ×1 IMPLANT
NS IRRIG 500ML POUR BTL (IV SOLUTION) ×5 IMPLANT
PACK BASIN MINOR ARMC (MISCELLANEOUS) ×3 IMPLANT
PACK EXTREMITY ARMC (MISCELLANEOUS) ×3 IMPLANT
PAD PREP 24X41 OB/GYN DISP (PERSONAL CARE ITEMS) ×1 IMPLANT
PULSAVAC PLUS IRRIG FAN TIP (DISPOSABLE)
SOL PREP PVP 2OZ (MISCELLANEOUS) ×6
SOLUTION PREP PVP 2OZ (MISCELLANEOUS) ×1 IMPLANT
SPONGE LAP 18X18 RF (DISPOSABLE) ×3 IMPLANT
SUT ETHILON 4-0 (SUTURE)
SUT ETHILON 4-0 FS2 18XMFL BLK (SUTURE)
SUT VIC AB 3-0 SH 27 (SUTURE)
SUT VIC AB 3-0 SH 27X BRD (SUTURE) IMPLANT
SUTURE ETHLN 4-0 FS2 18XMF BLK (SUTURE) IMPLANT
SWAB CULTURE AMIES ANAERIB BLU (MISCELLANEOUS) ×1 IMPLANT
SYR BULB IRRIG 60ML STRL (SYRINGE) ×3 IMPLANT
TIP FAN IRRIG PULSAVAC PLUS (DISPOSABLE) ×1 IMPLANT

## 2019-07-21 NOTE — Anesthesia Preprocedure Evaluation (Signed)
Anesthesia Evaluation  Patient identified by MRN, date of birth, ID band Patient awake    Reviewed: Allergy & Precautions, H&P , NPO status , Patient's Chart, lab work & pertinent test results, reviewed documented beta blocker date and time   Airway Mallampati: III  TM Distance: >3 FB Neck ROM: full    Dental  (+) Teeth Intact   Pulmonary pneumonia, resolved, Current Smoker,    Pulmonary exam normal        Cardiovascular Exercise Tolerance: Poor hypertension, + Peripheral Vascular Disease and +CHF  (-) Orthopnea Normal cardiovascular exam+ dysrhythmias  Rate:Normal     Neuro/Psych  Headaches, negative psych ROS   GI/Hepatic Neg liver ROS, hiatal hernia, GERD  ,  Endo/Other  negative endocrine ROSdiabetes  Renal/GU Renal disease  negative genitourinary   Musculoskeletal   Abdominal   Peds  Hematology  (+) Blood dyscrasia, anemia ,   Anesthesia Other Findings   Reproductive/Obstetrics negative OB ROS                             Anesthesia Physical Anesthesia Plan  ASA: III  Anesthesia Plan: General LMA   Post-op Pain Management:    Induction:   PONV Risk Score and Plan: 2  Airway Management Planned:   Additional Equipment:   Intra-op Plan:   Post-operative Plan:   Informed Consent: I have reviewed the patients History and Physical, chart, labs and discussed the procedure including the risks, benefits and alternatives for the proposed anesthesia with the patient or authorized representative who has indicated his/her understanding and acceptance.       Plan Discussed with: CRNA  Anesthesia Plan Comments:         Anesthesia Quick Evaluation

## 2019-07-21 NOTE — Progress Notes (Signed)
Pt comes in from surgery stating his pain was a 25/10. I gave him 200 Fentanyl and he states it is a 10/10 but he is sleeping peacefully and wakes every once in a while and states he needs more medicine but then he will fall back asleep. I alerted the pt that he cannot receive any more Fentanyl since he is going home. I called Dr. Kayleen Memos and he stated he does not want to give anymore Fentanyl but I can give him the Percocet 7.5 that he is being discharged with. Patient appears comfortable despite waking and stating he is 10/10 but he also says it went down and it is better. I will alert the post op staff about the pain management.

## 2019-07-21 NOTE — Op Note (Signed)
    OPERATIVE NOTE   PROCEDURE: 1. Irrigation and debridement of skin, soft tissue, and muscle to the left thigh for approximately 200 cm with negative pressure dressing placemen  PRE-OPERATIVE DIAGNOSIS: Nonviable tissue and infection of left thigh with a history of calciphylaxis and end-stage renal disease  POST-OPERATIVE DIAGNOSIS: Same as above  SURGEON: Leotis Pain, MD  ASSISTANT(S): Hezzie Bump, PA-C  ANESTHESIA: general  ESTIMATED BLOOD LOSS: 10 cc  FINDING(S): None  SPECIMEN(S):  Left thigh wound  INDICATIONS:   [name] is a 54 y.o. male who presents with a large necrotic ulceration on the anterior and lateral aspect of the left thigh.  He has a history of calciphylaxis.  This is exquisitely painful.  There is a large necrotic eschar limit wound healing and we are debriding this today.  He will get his negative pressure dressing changes from his wound care center starting next week.  An assistant was present during the procedure to help facilitate the exposure and expedite the procedure.  Risks and benefits are discussed and informed consent is obtained   DESCRIPTION: After obtaining full informed written consent, the patient was brought back to the operating room and placed supine upon the operating table.  The patient received IV antibiotics prior to induction. The assistant provided retraction and mobilization to help facilitate exposure and expedite the procedure throughout the entire procedure.  This included following suture, using retractors, and optimizing lighting. After obtaining adequate anesthesia, the patient was prepped and draped in the standard fashion.  The wound was then opened and excisional debridement was performed to remove the necrotic eschar including skin, soft tissue, and actually down to muscle fascia over much of the wound.  This was done to remove all clearly non-viable tissue.  The tissue was taken back to bleeding tissue that appeared  viable.  The debridement was performed with scalpel predominantly as well as with some Metzenbaum scissors and electrocautery and encompassed an area of approximately 200 cm2.  The wound was 21 to 22 cm in its longest length and about 10 to 11 cm in its widest width and was somewhat oval in appearance.  The wound was irrigated copiously with saline.  After all clearly non-viable tissue was removed, a negative pressure dressing was cut to fit the wound.  Strips of [place] were used and a good occlusive seal was obtained. The patient was then awakened from anesthesia and taken to the recovery room in stable condition having tolerated the procedure well.  COMPLICATIONS: none  CONDITION: stable  Leotis Pain  07/21/2019, 2:27 PM   This note was created with Dragon Medical transcription system. Any errors in dictation are purely unintentional.

## 2019-07-21 NOTE — Anesthesia Procedure Notes (Signed)
Procedure Name: LMA Insertion Date/Time: 07/21/2019 1:20 PM Performed by: Hedda Slade, CRNA Pre-anesthesia Checklist: Patient identified, Patient being monitored, Timeout performed, Emergency Drugs available and Suction available Patient Re-evaluated:Patient Re-evaluated prior to induction Oxygen Delivery Method: Circle system utilized Preoxygenation: Pre-oxygenation with 100% oxygen Induction Type: IV induction Ventilation: Mask ventilation without difficulty LMA: LMA inserted LMA Size: 4.5 Tube type: Oral Number of attempts: 1 Placement Confirmation: positive ETCO2 and breath sounds checked- equal and bilateral Tube secured with: Tape Dental Injury: Teeth and Oropharynx as per pre-operative assessment

## 2019-07-21 NOTE — Discharge Instructions (Signed)

## 2019-07-21 NOTE — Transfer of Care (Signed)
Immediate Anesthesia Transfer of Care Note  Patient: LAILA CONDIT  Procedure(s) Performed: DEBRIDEMENT WOUND (Left Thigh) APPLICATION OF WOUND VAC (Left Thigh)  Patient Location: PACU  Anesthesia Type:General  Level of Consciousness: sedated  Airway & Oxygen Therapy: Patient Spontanous Breathing and Patient connected to face mask oxygen  Post-op Assessment: Report given to RN and Post -op Vital signs reviewed and stable  Post vital signs: Reviewed and stable  Last Vitals:  Vitals Value Taken Time  BP 122/78 07/21/19 1428  Temp 36.2 C 07/21/19 1428  Pulse 103 07/21/19 1428  Resp 16 07/21/19 1428  SpO2 100 % 07/21/19 1428  Vitals shown include unvalidated device data.  Last Pain:  Vitals:   07/21/19 1428  TempSrc:   PainSc: 0-No pain      Patients Stated Pain Goal: 0 ([id number])  Complications: No apparent anesthesia complications

## 2019-07-21 NOTE — H&P (Signed)
[facility] History & Physical Update  The patient was interviewed and re-examined.  The patient's previous History and Physical has been reviewed and is unchanged.  There is no change in the plan of care. We plan to proceed with the scheduled procedure.  Leotis Pain, MD  07/21/2019, 12:57 PM

## 2019-07-23 LAB — SURGICAL PATHOLOGY

## 2019-07-28 NOTE — Anesthesia Postprocedure Evaluation (Signed)
Anesthesia Post Note  Patient: [name]  Procedure(s) Performed: DEBRIDEMENT WOUND (Left Thigh) APPLICATION OF WOUND VAC (Left Thigh)  Patient location during evaluation: PACU Anesthesia Type: General Level of consciousness: awake and alert Pain management: pain level controlled Vital Signs Assessment: post-procedure vital signs reviewed and stable Respiratory status: spontaneous breathing, nonlabored ventilation, respiratory function stable and patient connected to nasal cannula oxygen Cardiovascular status: blood pressure returned to baseline and stable Postop Assessment: no apparent nausea or vomiting Anesthetic complications: no     Last Vitals:  Vitals:   07/21/19 1612 07/21/19 1625  BP: 108/76 126/69  Pulse: 95 95  Resp: 14 20  Temp: (!) 36.2 C (!) 36.3 C  SpO2: 100% 98%    Last Pain:  Vitals:   07/22/19 1732  TempSrc:   PainSc: 4                  Molli Barrows

## 2019-08-12 ENCOUNTER — Inpatient Hospital Stay (HOSPITAL_COMMUNITY): Payer: Medicare Other

## 2019-08-12 ENCOUNTER — Other Ambulatory Visit: Payer: Self-pay

## 2019-08-12 ENCOUNTER — Inpatient Hospital Stay (HOSPITAL_COMMUNITY)
Admission: EM | Admit: 2019-08-12 | Discharge: 2019-09-13 | DRG: 870 | Disposition: E | Payer: Medicare Other | Attending: Pulmonary Disease | Admitting: Pulmonary Disease

## 2019-08-12 ENCOUNTER — Emergency Department (HOSPITAL_COMMUNITY): Payer: Medicare Other

## 2019-08-12 DIAGNOSIS — N2581 Secondary hyperparathyroidism of renal origin: Secondary | ICD-10-CM | POA: Diagnosis present

## 2019-08-12 DIAGNOSIS — I999 Unspecified disorder of circulatory system: Secondary | ICD-10-CM

## 2019-08-12 DIAGNOSIS — G9340 Encephalopathy, unspecified: Secondary | ICD-10-CM | POA: Diagnosis not present

## 2019-08-12 DIAGNOSIS — J9602 Acute respiratory failure with hypercapnia: Secondary | ICD-10-CM | POA: Diagnosis present

## 2019-08-12 DIAGNOSIS — Z20822 Contact with and (suspected) exposure to covid-19: Secondary | ICD-10-CM | POA: Diagnosis present

## 2019-08-12 DIAGNOSIS — G4089 Other seizures: Secondary | ICD-10-CM | POA: Diagnosis present

## 2019-08-12 DIAGNOSIS — I132 Hypertensive heart and chronic kidney disease with heart failure and with stage 5 chronic kidney disease, or end stage renal disease: Secondary | ICD-10-CM | POA: Diagnosis present

## 2019-08-12 DIAGNOSIS — Z4659 Encounter for fitting and adjustment of other gastrointestinal appliance and device: Secondary | ICD-10-CM

## 2019-08-12 DIAGNOSIS — I70262 Atherosclerosis of native arteries of extremities with gangrene, left leg: Secondary | ICD-10-CM | POA: Diagnosis present

## 2019-08-12 DIAGNOSIS — K219 Gastro-esophageal reflux disease without esophagitis: Secondary | ICD-10-CM | POA: Diagnosis present

## 2019-08-12 DIAGNOSIS — E1152 Type 2 diabetes mellitus with diabetic peripheral angiopathy with gangrene: Secondary | ICD-10-CM | POA: Diagnosis present

## 2019-08-12 DIAGNOSIS — Z79899 Other long term (current) drug therapy: Secondary | ICD-10-CM

## 2019-08-12 DIAGNOSIS — I4581 Long QT syndrome: Secondary | ICD-10-CM | POA: Diagnosis present

## 2019-08-12 DIAGNOSIS — E1122 Type 2 diabetes mellitus with diabetic chronic kidney disease: Secondary | ICD-10-CM | POA: Diagnosis present

## 2019-08-12 DIAGNOSIS — Z0189 Encounter for other specified special examinations: Secondary | ICD-10-CM

## 2019-08-12 DIAGNOSIS — E1165 Type 2 diabetes mellitus with hyperglycemia: Secondary | ICD-10-CM | POA: Diagnosis not present

## 2019-08-12 DIAGNOSIS — I96 Gangrene, not elsewhere classified: Secondary | ICD-10-CM | POA: Diagnosis not present

## 2019-08-12 DIAGNOSIS — Z888 Allergy status to other drugs, medicaments and biological substances status: Secondary | ICD-10-CM

## 2019-08-12 DIAGNOSIS — R6521 Severe sepsis with septic shock: Secondary | ICD-10-CM | POA: Diagnosis not present

## 2019-08-12 DIAGNOSIS — G9341 Metabolic encephalopathy: Secondary | ICD-10-CM | POA: Diagnosis present

## 2019-08-12 DIAGNOSIS — I82412 Acute embolism and thrombosis of left femoral vein: Secondary | ICD-10-CM | POA: Diagnosis not present

## 2019-08-12 DIAGNOSIS — J9601 Acute respiratory failure with hypoxia: Secondary | ICD-10-CM | POA: Diagnosis present

## 2019-08-12 DIAGNOSIS — Z8249 Family history of ischemic heart disease and other diseases of the circulatory system: Secondary | ICD-10-CM

## 2019-08-12 DIAGNOSIS — Z89512 Acquired absence of left leg below knee: Secondary | ICD-10-CM

## 2019-08-12 DIAGNOSIS — A4102 Sepsis due to Methicillin resistant Staphylococcus aureus: Secondary | ICD-10-CM | POA: Diagnosis present

## 2019-08-12 DIAGNOSIS — L97519 Non-pressure chronic ulcer of other part of right foot with unspecified severity: Secondary | ICD-10-CM | POA: Diagnosis present

## 2019-08-12 DIAGNOSIS — Z538 Procedure and treatment not carried out for other reasons: Secondary | ICD-10-CM | POA: Diagnosis not present

## 2019-08-12 DIAGNOSIS — E875 Hyperkalemia: Secondary | ICD-10-CM | POA: Diagnosis present

## 2019-08-12 DIAGNOSIS — Z515 Encounter for palliative care: Secondary | ICD-10-CM | POA: Diagnosis not present

## 2019-08-12 DIAGNOSIS — Z841 Family history of disorders of kidney and ureter: Secondary | ICD-10-CM

## 2019-08-12 DIAGNOSIS — F17210 Nicotine dependence, cigarettes, uncomplicated: Secondary | ICD-10-CM | POA: Diagnosis present

## 2019-08-12 DIAGNOSIS — Z885 Allergy status to narcotic agent status: Secondary | ICD-10-CM

## 2019-08-12 DIAGNOSIS — Z992 Dependence on renal dialysis: Secondary | ICD-10-CM | POA: Diagnosis not present

## 2019-08-12 DIAGNOSIS — Z01818 Encounter for other preprocedural examination: Secondary | ICD-10-CM

## 2019-08-12 DIAGNOSIS — I5023 Acute on chronic systolic (congestive) heart failure: Secondary | ICD-10-CM | POA: Diagnosis present

## 2019-08-12 DIAGNOSIS — Z66 Do not resuscitate: Secondary | ICD-10-CM | POA: Diagnosis not present

## 2019-08-12 DIAGNOSIS — N186 End stage renal disease: Secondary | ICD-10-CM | POA: Diagnosis present

## 2019-08-12 DIAGNOSIS — J15212 Pneumonia due to Methicillin resistant Staphylococcus aureus: Secondary | ICD-10-CM | POA: Diagnosis present

## 2019-08-12 DIAGNOSIS — R4020 Unspecified coma: Secondary | ICD-10-CM

## 2019-08-12 DIAGNOSIS — R0989 Other specified symptoms and signs involving the circulatory and respiratory systems: Secondary | ICD-10-CM | POA: Diagnosis not present

## 2019-08-12 DIAGNOSIS — I469 Cardiac arrest, cause unspecified: Secondary | ICD-10-CM | POA: Diagnosis present

## 2019-08-12 DIAGNOSIS — D6489 Other specified anemias: Secondary | ICD-10-CM | POA: Diagnosis present

## 2019-08-12 DIAGNOSIS — I5021 Acute systolic (congestive) heart failure: Secondary | ICD-10-CM | POA: Diagnosis not present

## 2019-08-12 DIAGNOSIS — Z6831 Body mass index (BMI) 31.0-31.9, adult: Secondary | ICD-10-CM

## 2019-08-12 DIAGNOSIS — Z8614 Personal history of Methicillin resistant Staphylococcus aureus infection: Secondary | ICD-10-CM

## 2019-08-12 DIAGNOSIS — G8929 Other chronic pain: Secondary | ICD-10-CM | POA: Diagnosis present

## 2019-08-12 DIAGNOSIS — D631 Anemia in chronic kidney disease: Secondary | ICD-10-CM | POA: Diagnosis present

## 2019-08-12 DIAGNOSIS — F411 Generalized anxiety disorder: Secondary | ICD-10-CM | POA: Diagnosis not present

## 2019-08-12 DIAGNOSIS — I950 Idiopathic hypotension: Secondary | ICD-10-CM

## 2019-08-12 DIAGNOSIS — I9589 Other hypotension: Secondary | ICD-10-CM | POA: Diagnosis not present

## 2019-08-12 DIAGNOSIS — Z7189 Other specified counseling: Secondary | ICD-10-CM | POA: Diagnosis not present

## 2019-08-12 DIAGNOSIS — I739 Peripheral vascular disease, unspecified: Secondary | ICD-10-CM | POA: Diagnosis not present

## 2019-08-12 DIAGNOSIS — R52 Pain, unspecified: Secondary | ICD-10-CM | POA: Diagnosis not present

## 2019-08-12 DIAGNOSIS — R680 Hypothermia, not associated with low environmental temperature: Secondary | ICD-10-CM | POA: Diagnosis present

## 2019-08-12 DIAGNOSIS — E876 Hypokalemia: Secondary | ICD-10-CM | POA: Diagnosis not present

## 2019-08-12 DIAGNOSIS — E785 Hyperlipidemia, unspecified: Secondary | ICD-10-CM | POA: Diagnosis present

## 2019-08-12 DIAGNOSIS — E669 Obesity, unspecified: Secondary | ICD-10-CM | POA: Diagnosis present

## 2019-08-12 DIAGNOSIS — J9600 Acute respiratory failure, unspecified whether with hypoxia or hypercapnia: Secondary | ICD-10-CM

## 2019-08-12 DIAGNOSIS — I998 Other disorder of circulatory system: Secondary | ICD-10-CM | POA: Diagnosis present

## 2019-08-12 DIAGNOSIS — N4829 Other inflammatory disorders of penis: Secondary | ICD-10-CM | POA: Diagnosis present

## 2019-08-12 DIAGNOSIS — A419 Sepsis, unspecified organism: Secondary | ICD-10-CM | POA: Diagnosis not present

## 2019-08-12 DIAGNOSIS — E11621 Type 2 diabetes mellitus with foot ulcer: Secondary | ICD-10-CM | POA: Diagnosis present

## 2019-08-12 DIAGNOSIS — Z95828 Presence of other vascular implants and grafts: Secondary | ICD-10-CM

## 2019-08-12 DIAGNOSIS — Z978 Presence of other specified devices: Secondary | ICD-10-CM

## 2019-08-12 DIAGNOSIS — R609 Edema, unspecified: Secondary | ICD-10-CM | POA: Diagnosis not present

## 2019-08-12 HISTORY — DX: Cardiac arrest, cause unspecified: I46.9

## 2019-08-12 HISTORY — DX: Dependence on renal dialysis: Z99.2

## 2019-08-12 HISTORY — DX: Dependence on renal dialysis: N18.6

## 2019-08-12 HISTORY — DX: Personal history of Methicillin resistant Staphylococcus aureus infection: Z86.14

## 2019-08-12 HISTORY — DX: Anemia in chronic kidney disease: D63.1

## 2019-08-12 HISTORY — DX: Chronic kidney disease, unspecified: N18.9

## 2019-08-12 LAB — POCT I-STAT 7, (LYTES, BLD GAS, ICA,H+H)
Acid-base deficit: 5 mmol/L — ABNORMAL HIGH (ref 0.0–2.0)
Acid-base deficit: 8 mmol/L — ABNORMAL HIGH (ref 0.0–2.0)
Acid-base deficit: 9 mmol/L — ABNORMAL HIGH (ref 0.0–2.0)
Bicarbonate: 19.1 mmol/L — ABNORMAL LOW (ref 20.0–28.0)
Bicarbonate: 16.5 mmol/L — ABNORMAL LOW (ref 20.0–28.0)
Bicarbonate: 15.6 mmol/L — ABNORMAL LOW (ref 20.0–28.0)
Calcium, Ion: 0.92 mmol/L — ABNORMAL LOW (ref 1.15–1.40)
Calcium, Ion: 0.93 mmol/L — ABNORMAL LOW (ref 1.15–1.40)
Calcium, Ion: 0.98 mmol/L — ABNORMAL LOW (ref 1.15–1.40)
HCT: 22 % — ABNORMAL LOW (ref 39.0–52.0)
HCT: 27 % — ABNORMAL LOW (ref 39.0–52.0)
HCT: 47 % (ref 39.0–52.0)
Hemoglobin: 7.5 g/dL — ABNORMAL LOW (ref 13.0–17.0)
Hemoglobin: 9.2 g/dL — ABNORMAL LOW (ref 13.0–17.0)
Hemoglobin: 16 g/dL (ref 13.0–17.0)
O2 Saturation: 100 %
O2 Saturation: 99 %
O2 Saturation: 100 %
Patient temperature: 96.2
Patient temperature: 86.8
Patient temperature: 36.4
Potassium: 5.4 mmol/L — ABNORMAL HIGH (ref 3.5–5.1)
Potassium: 5.5 mmol/L — ABNORMAL HIGH (ref 3.5–5.1)
Potassium: 5.2 mmol/L — ABNORMAL HIGH (ref 3.5–5.1)
Sodium: 136 mmol/L (ref 135–145)
Sodium: 136 mmol/L (ref 135–145)
Sodium: 135 mmol/L (ref 135–145)
TCO2: 20 mmol/L — ABNORMAL LOW (ref 22–32)
TCO2: 17 mmol/L — ABNORMAL LOW (ref 22–32)
TCO2: 16 mmol/L — ABNORMAL LOW (ref 22–32)
pCO2 arterial: 29.5 mmHg — ABNORMAL LOW (ref 32.0–48.0)
pCO2 arterial: 23 mmHg — ABNORMAL LOW (ref 32.0–48.0)
pCO2 arterial: 29 mmHg — ABNORMAL LOW (ref 32.0–48.0)
pH, Arterial: 7.414 (ref 7.350–7.450)
pH, Arterial: 7.432 (ref 7.350–7.450)
pH, Arterial: 7.336 — ABNORMAL LOW (ref 7.350–7.450)
pO2, Arterial: 421 mmHg — ABNORMAL HIGH (ref 83.0–108.0)
pO2, Arterial: 89 mmHg (ref 83.0–108.0)
pO2, Arterial: 353 mmHg — ABNORMAL HIGH (ref 83.0–108.0)

## 2019-08-12 LAB — COMPREHENSIVE METABOLIC PANEL
ALT: 19 U/L (ref 0–44)
AST: 50 U/L — ABNORMAL HIGH (ref 15–41)
Albumin: 2.2 g/dL — ABNORMAL LOW (ref 3.5–5.0)
Alkaline Phosphatase: 91 U/L (ref 38–126)
Anion gap: 29 — ABNORMAL HIGH (ref 5–15)
BUN: 121 mg/dL — ABNORMAL HIGH (ref 6–20)
CO2: 15 mmol/L — ABNORMAL LOW (ref 22–32)
Calcium: 7.7 mg/dL — ABNORMAL LOW (ref 8.9–10.3)
Chloride: 95 mmol/L — ABNORMAL LOW (ref 98–111)
Creatinine, Ser: 16.09 mg/dL — ABNORMAL HIGH (ref 0.61–1.24)
GFR calc Af Amer: 3 mL/min — ABNORMAL LOW (ref >60)
GFR calc non Af Amer: 3 mL/min — ABNORMAL LOW (ref >60)
Glucose, Bld: 142 mg/dL — ABNORMAL HIGH (ref 70–99)
Potassium: 5.8 mmol/L — ABNORMAL HIGH (ref 3.5–5.1)
Sodium: 139 mmol/L (ref 135–145)
Total Bilirubin: 0.5 mg/dL (ref 0.3–1.2)
Total Protein: 7.7 g/dL (ref 6.5–8.1)

## 2019-08-12 LAB — HEMOGLOBIN A1C
Hgb A1c MFr Bld: 5.6 % (ref 4.8–5.6)
Mean Plasma Glucose: 114.02 mg/dL

## 2019-08-12 LAB — CBC WITH DIFFERENTIAL/PLATELET
Abs Immature Granulocytes: 0.28 10*3/uL — ABNORMAL HIGH (ref 0.00–0.07)
Basophils Absolute: 0.1 10*3/uL (ref 0.0–0.1)
Basophils Relative: 0 %
Eosinophils Absolute: 0.1 10*3/uL (ref 0.0–0.5)
Eosinophils Relative: 1 %
HCT: 23 % — ABNORMAL LOW (ref 39.0–52.0)
Hemoglobin: 6.4 g/dL — CL (ref 13.0–17.0)
Immature Granulocytes: 2 %
Lymphocytes Relative: 11 %
Lymphs Abs: 1.7 10*3/uL (ref 0.7–4.0)
MCH: 24.7 pg — ABNORMAL LOW (ref 26.0–34.0)
MCHC: 27.8 g/dL — ABNORMAL LOW (ref 30.0–36.0)
MCV: 88.8 fL (ref 80.0–100.0)
Monocytes Absolute: 1.1 10*3/uL — ABNORMAL HIGH (ref 0.1–1.0)
Monocytes Relative: 8 %
Neutro Abs: 11.5 10*3/uL — ABNORMAL HIGH (ref 1.7–7.7)
Neutrophils Relative %: 78 %
Platelets: 520 10*3/uL — ABNORMAL HIGH (ref 150–400)
RBC: 2.59 MIL/uL — ABNORMAL LOW (ref 4.22–5.81)
RDW: 20 % — ABNORMAL HIGH (ref 11.5–15.5)
WBC: 14.8 10*3/uL — ABNORMAL HIGH (ref 4.0–10.5)
nRBC: 0 % (ref 0.0–0.2)

## 2019-08-12 LAB — HEMOGLOBIN AND HEMATOCRIT, BLOOD
HCT: 27.9 % — ABNORMAL LOW (ref 39.0–52.0)
Hemoglobin: 8.1 g/dL — ABNORMAL LOW (ref 13.0–17.0)

## 2019-08-12 LAB — PROTIME-INR
INR: 1.4 — ABNORMAL HIGH (ref 0.8–1.2)
Prothrombin Time: 17.2 seconds — ABNORMAL HIGH (ref 11.4–15.2)

## 2019-08-12 LAB — PHOSPHORUS: Phosphorus: 8.3 mg/dL — ABNORMAL HIGH (ref 2.5–4.6)

## 2019-08-12 LAB — PREPARE RBC (CROSSMATCH)

## 2019-08-12 LAB — GLUCOSE, CAPILLARY
Glucose-Capillary: 119 mg/dL — ABNORMAL HIGH (ref 70–99)
Glucose-Capillary: 12 mg/dL — CL (ref 70–99)
Glucose-Capillary: 133 mg/dL — ABNORMAL HIGH (ref 70–99)
Glucose-Capillary: 143 mg/dL — ABNORMAL HIGH (ref 70–99)
Glucose-Capillary: 147 mg/dL — ABNORMAL HIGH (ref 70–99)
Glucose-Capillary: 148 mg/dL — ABNORMAL HIGH (ref 70–99)

## 2019-08-12 LAB — TROPONIN I (HIGH SENSITIVITY)
Troponin I (High Sensitivity): 36 ng/L — ABNORMAL HIGH (ref <18)
Troponin I (High Sensitivity): 52 ng/L — ABNORMAL HIGH (ref <18)

## 2019-08-12 LAB — PROCALCITONIN: Procalcitonin: 10.03 ng/mL

## 2019-08-12 LAB — RESPIRATORY PANEL BY RT PCR (FLU A&B, COVID)
Influenza A by PCR: NEGATIVE
Influenza B by PCR: NEGATIVE
SARS Coronavirus 2 by RT PCR: NEGATIVE

## 2019-08-12 LAB — HIV ANTIBODY (ROUTINE TESTING W REFLEX): HIV Screen 4th Generation wRfx: NONREACTIVE

## 2019-08-12 LAB — MAGNESIUM: Magnesium: 2.3 mg/dL (ref 1.7–2.4)

## 2019-08-12 LAB — ABO/RH: ABO/RH(D): A POS

## 2019-08-12 LAB — MRSA PCR SCREENING: MRSA by PCR: POSITIVE — AB

## 2019-08-12 MED ORDER — MIDAZOLAM HCL 2 MG/2ML IJ SOLN
2.0000 mg | INTRAMUSCULAR | Status: AC | PRN
  Administered 2019-08-12 (×2): 2 mg via INTRAVENOUS
  Administered 2019-08-14: 1 mg via INTRAVENOUS
  Filled 2019-08-12 (×2): qty 2

## 2019-08-12 MED ORDER — CHLORHEXIDINE GLUCONATE 0.12% ORAL RINSE (MEDLINE KIT)
15.0000 mL | Freq: Two times a day (BID) | OROMUCOSAL | Status: DC
  Administered 2019-08-12: 15 mL via OROMUCOSAL

## 2019-08-12 MED ORDER — MIDAZOLAM HCL 2 MG/2ML IJ SOLN
2.0000 mg | Freq: Once | INTRAMUSCULAR | Status: AC
  Administered 2019-08-12: 2 mg via INTRAVENOUS
  Filled 2019-08-12: qty 2

## 2019-08-12 MED ORDER — PIPERACILLIN-TAZOBACTAM 3.375 G IVPB 30 MIN
3.3750 g | Freq: Four times a day (QID) | INTRAVENOUS | Status: DC
  Administered 2019-08-12 – 2019-08-15 (×12): 3.375 g via INTRAVENOUS
  Filled 2019-08-12 (×23): qty 50

## 2019-08-12 MED ORDER — MIDAZOLAM HCL 2 MG/2ML IJ SOLN
1.0000 mg | INTRAMUSCULAR | Status: DC | PRN
  Administered 2019-08-12 (×2): 1 mg via INTRAVENOUS
  Filled 2019-08-12: qty 2

## 2019-08-12 MED ORDER — MIDAZOLAM 50MG/50ML (1MG/ML) PREMIX INFUSION
0.5000 mg/h | INTRAVENOUS | Status: DC
  Administered 2019-08-12: 0.5 mg/h via INTRAVENOUS
  Filled 2019-08-12 (×2): qty 50

## 2019-08-12 MED ORDER — BISACODYL 10 MG RE SUPP
10.0000 mg | Freq: Every day | RECTAL | Status: DC | PRN
  Administered 2019-08-25: 11:00:00 10 mg via RECTAL
  Filled 2019-08-12: qty 1

## 2019-08-12 MED ORDER — LORAZEPAM 2 MG/ML IJ SOLN
2.0000 mg | INTRAMUSCULAR | Status: DC | PRN
  Administered 2019-08-12 – 2019-08-16 (×2): 2 mg via INTRAVENOUS
  Filled 2019-08-12 (×2): qty 1

## 2019-08-12 MED ORDER — HYDROCORTISONE NA SUCCINATE PF 100 MG IJ SOLR
50.0000 mg | Freq: Four times a day (QID) | INTRAMUSCULAR | Status: DC
  Administered 2019-08-12 – 2019-08-17 (×22): 50 mg via INTRAVENOUS
  Filled 2019-08-12 (×22): qty 2

## 2019-08-12 MED ORDER — SODIUM CHLORIDE 0.9% FLUSH: 10.0000 mL | INTRAVENOUS | Status: DC | PRN

## 2019-08-12 MED ORDER — SODIUM THIOSULFATE 25 % IV SOLN
25.0000 g | INTRAVENOUS | Status: DC
  Administered 2019-08-13 – 2019-08-27 (×6): 25 g via INTRAVENOUS
  Filled 2019-08-12 (×11): qty 100

## 2019-08-12 MED ORDER — VANCOMYCIN HCL 750 MG/150ML IV SOLN
750.0000 mg | INTRAVENOUS | Status: DC
  Administered 2019-08-13 – 2019-08-19 (×7): 750 mg via INTRAVENOUS
  Filled 2019-08-12 (×7): qty 150

## 2019-08-12 MED ORDER — MIDAZOLAM HCL 2 MG/2ML IJ SOLN
1.0000 mg | INTRAMUSCULAR | Status: DC | PRN
  Filled 2019-08-12: qty 2

## 2019-08-12 MED ORDER — NOREPINEPHRINE 4 MG/250ML-% IV SOLN
0.0000 ug/min | INTRAVENOUS | Status: DC
  Administered 2019-08-12: 20 ug/min via INTRAVENOUS
  Administered 2019-08-12: 10 ug/min via INTRAVENOUS
  Filled 2019-08-12 (×4): qty 250

## 2019-08-12 MED ORDER — DARBEPOETIN ALFA 150 MCG/0.3ML IJ SOSY
150.0000 ug | PREFILLED_SYRINGE | INTRAMUSCULAR | Status: DC
  Administered 2019-08-14: 150 ug via INTRAVENOUS
  Filled 2019-08-12 (×2): qty 0.3

## 2019-08-12 MED ORDER — FENTANYL BOLUS VIA INFUSION
25.0000 ug | INTRAVENOUS | Status: DC | PRN
  Administered 2019-08-14 – 2019-08-16 (×11): 25 ug via INTRAVENOUS
  Filled 2019-08-12: qty 25

## 2019-08-12 MED ORDER — HEPARIN SODIUM (PORCINE) 1000 UNIT/ML DIALYSIS
1000.0000 [IU] | INTRAMUSCULAR | Status: DC | PRN
  Administered 2019-08-18: 3100 [IU] via INTRAVENOUS_CENTRAL
  Filled 2019-08-12: qty 4
  Filled 2019-08-12 (×2): qty 6
  Filled 2019-08-12: qty 3
  Filled 2019-08-12: qty 6

## 2019-08-12 MED ORDER — FENTANYL CITRATE (PF) 100 MCG/2ML IJ SOLN
100.0000 ug | Freq: Once | INTRAMUSCULAR | Status: AC
  Administered 2019-08-12: 100 ug via INTRAVENOUS
  Filled 2019-08-12: qty 2

## 2019-08-12 MED ORDER — VECURONIUM BOLUS VIA INFUSION: 10.0000 mg | Freq: Once | INTRAVENOUS | Status: DC

## 2019-08-12 MED ORDER — GABAPENTIN 250 MG/5ML PO SOLN
200.0000 mg | Freq: Two times a day (BID) | ORAL | Status: DC
  Administered 2019-08-12: 200 mg via ORAL
  Filled 2019-08-12 (×2): qty 4

## 2019-08-12 MED ORDER — CHLORHEXIDINE GLUCONATE 0.12% ORAL RINSE (MEDLINE KIT)
15.0000 mL | Freq: Two times a day (BID) | OROMUCOSAL | Status: DC
  Administered 2019-08-12 – 2019-08-20 (×18): 15 mL via OROMUCOSAL

## 2019-08-12 MED ORDER — PANTOPRAZOLE SODIUM 40 MG IV SOLR
40.0000 mg | Freq: Every day | INTRAVENOUS | Status: DC
  Administered 2019-08-12 – 2019-08-17 (×6): 40 mg via INTRAVENOUS
  Filled 2019-08-12 (×6): qty 40

## 2019-08-12 MED ORDER — FENTANYL CITRATE (PF) 100 MCG/2ML IJ SOLN: 25.0000 ug | Freq: Once | INTRAMUSCULAR | Status: DC

## 2019-08-12 MED ORDER — MIDAZOLAM HCL 2 MG/2ML IJ SOLN
2.0000 mg | INTRAMUSCULAR | Status: DC | PRN
  Administered 2019-08-15 – 2019-08-18 (×5): 2 mg via INTRAVENOUS
  Filled 2019-08-12 (×3): qty 2

## 2019-08-12 MED ORDER — VITAL HIGH PROTEIN PO LIQD
1000.0000 mL | ORAL | Status: DC
  Administered 2019-08-12: 1000 mL

## 2019-08-12 MED ORDER — HEPARIN BOLUS VIA INFUSION (CRRT)
1000.0000 [IU] | INTRAVENOUS | Status: DC | PRN
  Administered 2019-08-12 – 2019-08-15 (×10): 1000 [IU] via INTRAVENOUS_CENTRAL
  Filled 2019-08-12: qty 1000

## 2019-08-12 MED ORDER — NOREPINEPHRINE 16 MG/250ML-% IV SOLN
0.0000 ug/min | INTRAVENOUS | Status: DC
  Administered 2019-08-12 (×2): 40 ug/min via INTRAVENOUS
  Administered 2019-08-13 (×2): 45 ug/min via INTRAVENOUS
  Administered 2019-08-13: 38 ug/min via INTRAVENOUS
  Administered 2019-08-14: 35 ug/min via INTRAVENOUS
  Administered 2019-08-14: 16 ug/min via INTRAVENOUS
  Administered 2019-08-15: 18 ug/min via INTRAVENOUS
  Administered 2019-08-17: 12 ug/min via INTRAVENOUS
  Administered 2019-08-21: 6 ug/min via INTRAVENOUS
  Administered 2019-08-23: 5 ug/min via INTRAVENOUS
  Administered 2019-08-24: 23:00:00 8 ug/min via INTRAVENOUS
  Administered 2019-08-25: 20:00:00 9 ug/min via INTRAVENOUS
  Administered 2019-08-27: 12 ug/min via INTRAVENOUS
  Administered 2019-08-27 (×2): 20 ug/min via INTRAVENOUS
  Filled 2019-08-12 (×18): qty 250

## 2019-08-12 MED ORDER — CHLORHEXIDINE GLUCONATE CLOTH 2 % EX PADS
6.0000 | MEDICATED_PAD | Freq: Every day | CUTANEOUS | Status: DC
  Administered 2019-08-12 – 2019-08-13 (×2): 6 via TOPICAL

## 2019-08-12 MED ORDER — INSULIN ASPART 100 UNIT/ML ~~LOC~~ SOLN
0.0000 [IU] | SUBCUTANEOUS | Status: DC
  Administered 2019-08-13: 1 [IU] via SUBCUTANEOUS
  Administered 2019-08-13: 17:00:00 2 [IU] via SUBCUTANEOUS
  Administered 2019-08-13: 1 [IU] via SUBCUTANEOUS
  Administered 2019-08-13: 2 [IU] via SUBCUTANEOUS
  Administered 2019-08-13: 21:00:00 3 [IU] via SUBCUTANEOUS

## 2019-08-12 MED ORDER — FENTANYL CITRATE (PF) 100 MCG/2ML IJ SOLN
INTRAMUSCULAR | Status: AC
  Filled 2019-08-12: qty 2

## 2019-08-12 MED ORDER — PIPERACILLIN-TAZOBACTAM 3.375 G IVPB: 3.3750 g | Freq: Two times a day (BID) | INTRAVENOUS | Status: DC

## 2019-08-12 MED ORDER — SODIUM CHLORIDE 0.9 % IV SOLN
250.0000 [IU]/h | INTRAVENOUS | Status: DC
  Administered 2019-08-12: 250 [IU]/h via INTRAVENOUS_CENTRAL
  Administered 2019-08-13 (×2): 2050 [IU]/h via INTRAVENOUS_CENTRAL
  Administered 2019-08-13: 1850 [IU]/h via INTRAVENOUS_CENTRAL
  Administered 2019-08-14 – 2019-08-15 (×6): 2050 [IU]/h via INTRAVENOUS_CENTRAL
  Administered 2019-08-15 (×2): 2400 [IU]/h via INTRAVENOUS_CENTRAL
  Administered 2019-08-15: 2150 [IU]/h via INTRAVENOUS_CENTRAL
  Administered 2019-08-15: 2050 [IU]/h via INTRAVENOUS_CENTRAL
  Administered 2019-08-16: 09:00:00 2400 [IU]/h via INTRAVENOUS_CENTRAL
  Administered 2019-08-16: 2450 [IU]/h via INTRAVENOUS_CENTRAL
  Administered 2019-08-16 (×4): 2400 [IU]/h via INTRAVENOUS_CENTRAL
  Administered 2019-08-16: 2450 [IU]/h via INTRAVENOUS_CENTRAL
  Administered 2019-08-17 – 2019-08-18 (×2): 2500 [IU]/h via INTRAVENOUS_CENTRAL
  Filled 2019-08-12 (×24): qty 2

## 2019-08-12 MED ORDER — SUCCINYLCHOLINE CHLORIDE 20 MG/ML IJ SOLN
INTRAMUSCULAR | Status: AC | PRN
  Administered 2019-08-12: 125 mg via INTRAVENOUS

## 2019-08-12 MED ORDER — SODIUM CHLORIDE 0.9 % IV SOLN
10.0000 mL/h | Freq: Once | INTRAVENOUS | Status: AC
  Administered 2019-08-12: 03:00:00 10 mL/h via INTRAVENOUS

## 2019-08-12 MED ORDER — INFLUENZA VAC SPLIT QUAD 0.5 ML IM SUSY: 0.5000 mL | PREFILLED_SYRINGE | INTRAMUSCULAR | Status: DC | PRN

## 2019-08-12 MED ORDER — SODIUM CHLORIDE 0.9 % IV BOLUS
1000.0000 mL | Freq: Once | INTRAVENOUS | Status: AC
  Administered 2019-08-12: 14:00:00 1000 mL via INTRAVENOUS

## 2019-08-12 MED ORDER — HEPARIN SODIUM (PORCINE) 1000 UNIT/ML DIALYSIS: 1000.0000 [IU] | INTRAMUSCULAR | Status: DC | PRN

## 2019-08-12 MED ORDER — ETOMIDATE 2 MG/ML IV SOLN
INTRAVENOUS | Status: AC | PRN
  Administered 2019-08-12: 20 mg via INTRAVENOUS

## 2019-08-12 MED ORDER — LACTATED RINGERS IV BOLUS
1000.0000 mL | Freq: Once | INTRAVENOUS | Status: AC
  Administered 2019-08-12: 1000 mL via INTRAVENOUS

## 2019-08-12 MED ORDER — ORAL CARE MOUTH RINSE
15.0000 mL | OROMUCOSAL | Status: DC
  Administered 2019-08-12 – 2019-08-18 (×60): 15 mL via OROMUCOSAL

## 2019-08-12 MED ORDER — PRISMASOL BGK 4/2.5 32-4-2.5 MEQ/L REPLACEMENT SOLN: Status: DC

## 2019-08-12 MED ORDER — MIDAZOLAM HCL 2 MG/2ML IJ SOLN: 2.0000 mg | Freq: Once | INTRAMUSCULAR | Status: AC

## 2019-08-12 MED ORDER — SODIUM CHLORIDE 0.9 % IV SOLN
INTRAVENOUS | Status: AC | PRN
  Administered 2019-08-12 (×2): 1000 mL via INTRAVENOUS

## 2019-08-12 MED ORDER — LORAZEPAM 2 MG/ML IJ SOLN
2.0000 mg | Freq: Once | INTRAMUSCULAR | Status: AC
  Administered 2019-08-12: 2 mg via INTRAVENOUS
  Filled 2019-08-12: qty 1

## 2019-08-12 MED ORDER — FENTANYL CITRATE (PF) 100 MCG/2ML IJ SOLN
INTRAMUSCULAR | Status: DC | PRN
  Administered 2019-08-12: 50 ug via INTRAVENOUS

## 2019-08-12 MED ORDER — VECURONIUM BROMIDE 10 MG IV SOLR: 10.0000 mg | Freq: Once | INTRAVENOUS | Status: DC

## 2019-08-12 MED ORDER — PRISMASOL BGK 4/2.5 32-4-2.5 MEQ/L IV SOLN: INTRAVENOUS | Status: DC

## 2019-08-12 MED ORDER — ALBUTEROL SULFATE (2.5 MG/3ML) 0.083% IN NEBU: 2.5000 mg | INHALATION_SOLUTION | RESPIRATORY_TRACT | Status: DC | PRN

## 2019-08-12 MED ORDER — ORAL CARE MOUTH RINSE
15.0000 mL | OROMUCOSAL | Status: DC
  Administered 2019-08-12 (×4): 15 mL via OROMUCOSAL

## 2019-08-12 MED ORDER — FENTANYL 2500MCG IN NS 250ML (10MCG/ML) PREMIX INFUSION
0.0000 ug/h | INTRAVENOUS | Status: DC
  Administered 2019-08-12: 50 ug/h via INTRAVENOUS
  Administered 2019-08-12: 300 ug/h via INTRAVENOUS
  Administered 2019-08-12: 35 ug/h via INTRAVENOUS
  Administered 2019-08-13: 350 ug/h via INTRAVENOUS
  Administered 2019-08-13: 150 ug/h via INTRAVENOUS
  Administered 2019-08-13 – 2019-08-14 (×2): 300 ug/h via INTRAVENOUS
  Administered 2019-08-14 – 2019-08-15 (×2): 200 ug/h via INTRAVENOUS
  Administered 2019-08-15: 150 ug/h via INTRAVENOUS
  Administered 2019-08-16: 200 ug/h via INTRAVENOUS
  Filled 2019-08-12 (×10): qty 250

## 2019-08-12 MED ORDER — SODIUM CHLORIDE 0.9% FLUSH
10.0000 mL | Freq: Two times a day (BID) | INTRAVENOUS | Status: DC
  Administered 2019-08-12: 10 mL
  Administered 2019-08-12: 20 mL
  Administered 2019-08-13 – 2019-08-14 (×4): 10 mL
  Administered 2019-08-15: 20 mL
  Administered 2019-08-15 – 2019-08-17 (×4): 10 mL

## 2019-08-12 MED ORDER — LACTATED RINGERS IV BOLUS: 1000.0000 mL | Freq: Once | INTRAVENOUS | Status: AC

## 2019-08-12 MED ORDER — VANCOMYCIN HCL IN DEXTROSE 1-5 GM/200ML-% IV SOLN: 1000.0000 mg | INTRAVENOUS | Status: DC

## 2019-08-12 MED ORDER — HEPARIN SODIUM (PORCINE) 5000 UNIT/ML IJ SOLN
5000.0000 [IU] | Freq: Three times a day (TID) | INTRAMUSCULAR | Status: DC
  Administered 2019-08-12: 5000 [IU] via SUBCUTANEOUS
  Filled 2019-08-12: qty 1

## 2019-08-12 MED ORDER — VASOPRESSIN 20 UNIT/ML IV SOLN
0.0100 [IU]/min | INTRAVENOUS | Status: DC
  Administered 2019-08-12 – 2019-08-17 (×6): 0.03 [IU]/min via INTRAVENOUS
  Filled 2019-08-12 (×7): qty 2

## 2019-08-12 MED ORDER — MIDAZOLAM HCL 2 MG/2ML IJ SOLN
INTRAMUSCULAR | Status: AC
  Administered 2019-08-12: 2 mg via INTRAVENOUS
  Filled 2019-08-12: qty 2

## 2019-08-12 MED ORDER — PNEUMOCOCCAL VAC POLYVALENT 25 MCG/0.5ML IJ INJ: 0.5000 mL | INJECTION | INTRAMUSCULAR | Status: DC | PRN

## 2019-08-12 MED ORDER — VANCOMYCIN HCL 2000 MG/400ML IV SOLN
2000.0000 mg | Freq: Once | INTRAVENOUS | Status: AC
  Administered 2019-08-12: 12:00:00 2000 mg via INTRAVENOUS
  Filled 2019-08-12 (×3): qty 400

## 2019-08-12 NOTE — Procedures (Signed)
Arterial Catheter Insertion Procedure Note [name] [id number] [birth year]  Procedure: Insertion of Arterial Catheter  Indications: Blood pressure monitoring  Procedure Details Consent: Unable to obtain consent because of emergent medical necessity. Time Out: Verified patient identification, verified procedure, site/side was marked, verified correct patient position, special equipment/implants available, medications/allergies/relevent history reviewed, required imaging and test results available.  Performed  Maximum sterile technique was used including antiseptics, cap, gloves, gown, hand hygiene, mask and sheet. Skin prep: Chlorhexidine; local anesthetic administered 20 gauge catheter was inserted into left femoral artery using the Seldinger technique. ULTRASOUND GUIDANCE USED: YES Evaluation Blood flow poor; BP tracing poor. Complications: No apparent complications.  Unable to obtain tracing. Catheter removed.  Einar Grad [name] 08/04/2019

## 2019-08-12 NOTE — Progress Notes (Signed)
eLink Physician-Brief Progress Note Patient Name: [name] DOB: 1966/03/30 MRN: [id number]   Date of Service  07/17/2019  HPI/Events of Note  Pt admitted through the ED s/p cardiac arrest, he has a history of ESRD-DD.He was initially GCS 3 but is now following commands, arrest was brief at 3 minutes.  eICU Interventions  Cooling deferred due to neurologic recovery, New Patient Evaluation completed.        Frederik Pear 07/22/2019, 6:39 AM

## 2019-08-12 NOTE — Progress Notes (Signed)
Pharmacy Antibiotic Note  [name] is a 54 y.o. male admitted on 08/07/2019 with sepsis.  Pharmacy has been consulted for Vancomycin/Zosyn dosing. S/P brief cardiac arrest. WBC elevated. ESRD on HD MWF.   Plan Vancomycin 2000 mg IV x 1, then 1000 mg IV qHD MWF Zosyn 3.375G IV q12h to be infused over 4 hours Trend WBC, temp, HD schedule (adjust vancomycin if schedule changes) F/U infectious work-up Drug levels as indicated  Temp (24hrs), Avg:96.1 F (35.6 C), Min:95.6 F (35.3 C), Max:96.6 F (35.9 C)  Recent Labs  Lab 08/07/2019 0141  WBC 14.8*  CREATININE 16.09*    CrCl cannot be calculated (Unknown ideal weight.).    Not on [place], PharmD, [place] Pharmacist Phone: [phone]

## 2019-08-12 NOTE — ED Notes (Signed)
Verbal consent for blood obtained over the phone from daughter.

## 2019-08-12 NOTE — Progress Notes (Signed)
Initial Nutrition Assessment  DOCUMENTATION CODES:   Not applicable  INTERVENTION:  Monitor magnesium, potassium, and phosphorus daily for at least 3 days, MD to replete as needed, as pt is at risk for refeeding syndrome.  Recommend TF via OGT with Vital AF 1.2 Cal @ 20 ml/hr, advance by 10 ml every 4 hours to goal rate of 70 ml/hr (1633mL/d). At goal, tube feeding will provide 2016 kcals, 126 gm protein, 1362 ml free water daily.    NUTRITION DIAGNOSIS:   Inadequate oral intake related to inability to eat as evidenced by NPO status.   GOAL:   Patient will meet greater than or equal to 90% of their needs   MONITOR:   TF tolerance, Labs, Weight trends, I & O's, Skin, Vent status  REASON FOR ASSESSMENT:   Ventilator    ASSESSMENT:   Pt with a PMH significant for L BKA, ESRD, DM, GERD, secondary hyperparathyroidism calciphylaxis and PVD who was brought to the ED s/p cardiac arrest.   RD visited pt while RN in room monitoring blood pressure.  Of note, pt's wife reported upon admission that pt has experienced notable wt loss.  Medications reviewed and include: SSI, Prismasol BGK 4/2.5, Levophed, Pitressin, IV abx  Labs reviewed: K+ 5.5 (H), Ionized Ca 0.93 (L)  I/O: +1,106.52mL since admit   Pt last dialyzed on 1/26 with departure wt of 88.5kg relative to EDW of 86 kg.   Pt on CVVHD.  Patient is currently intubated on ventilator support MV: 14.9 L/min Temp (24hrs), Avg:96.8 F (36 C), Min:95.6 F (35.3 C), Max:98.4 F (36.9 C)   NUTRITION - FOCUSED PHYSICAL EXAM:  Deferred  Diet Order:   Diet Order            Diet NPO time specified  Diet effective now              EDUCATION NEEDS:   Not appropriate for education at this time  Skin:  Skin Assessment: Skin Integrity Issues: Skin Integrity Issues:: Other (Comment) Other: non-pressure wounds: penis, left leg  Last BM:  1/28  Height:   Ht Readings from Last 1 Encounters:  07/20/2019 5\' 9"  (1.753  m)    Weight:   Wt Readings from Last 1 Encounters:  07/20/2019 87.9 kg    Ideal Body Weight:  67.97 kg  BMI:  Body mass index is 28.62 kg/m.  Estimated Nutritional Needs:   Kcal:  2033  Protein:  125-135 grams  Fluid:  >/= 2L/d   Larkin Ina, MS, RD, LDN Pager: [phone] Weekend/After Hours Pager: [phone]

## 2019-08-12 NOTE — H&P (Signed)
NAME:  [name], MRN:  [id number], DOB:  01-12-1966, LOS: 0 ADMISSION DATE:  07/31/2019, CONSULTATION DATE:  07/30/2019 REFERRING MD:  Dr. Roxanne Mins, CHIEF COMPLAINT:  Cardiac arrest  Brief History   72 yoM ESRD on iHD (unclear last iHD) found unresponsive then witnessed cardiac arrest with ROSC after 3 mins.  Required intubation for ongoing airway protection, ongoing unresponsiveness, and hypotensive requiring vasopressor support.  In ER, patient did start to wake up, localize, and reaching for ETT. PCCM to admit.   History of present illness   HPI obtained from medical chart review as patient is encephalopathic on mechanical ventilation.   Pending chart review with MRN [id number].  Additional information obtained from patient's wife- currently separated but not divorced at bedside.   54 year old male with extensive hx as below significant for but not limited to ESRD on iHD (last iHD 1/27), HFrEF 25-30%, DMT2, and anemia, found unrepsonsive by a friend.  He is currently separated from his wife but remains in contact.  She reports speaking to him yesterday after iHD.  Reports he didn't feel well and noticed his voice was raspy and SOB. Additionally reports he has had a functional decline recently, decreased mobility, notable weight loss, and worsening pain with his calciphylaxis causing him not to be abe to wear his prothesis.    On EMS arrival, patient found with a pulse with very poor respirations which decompensated to cardiac arrest with EMS.  ACLS measures performed with ROSC after 3 mins.  Unknown rhythm. Given naloxone by EMS with posturing episode and generalized seizure treated with midazolam.  In ER, he remained unresponsive requiring intubation with ongoing hypotension despite IVF requiring vasopressor support.  Afebrile. CTH negative for bleed, no acute process.  CXR noted for left subsegmental atelectasis with stable ETT placement.  Labs resulted thus far noted for K 5.8, CO2 15, glucose  142, BUN 121, sCr 16, Mag 2.3, WBC 14.8, Hgb 6.4, HCT 23, platelets 520, neg SARS2/ Flu, and ABG post intubation 7.414/ 29.5/ 421/ 19.1.  He was given 2L NS and started on first unit of PRBC.  PCCM called for admission.   Past Medical History   . Amputee, below knee, left ([place])   . Anemia   . Blood transfusion   . Bronchitis   . Chest pain   . CHF (congestive heart failure) ([place])   . Chronic kidney disease    dialysis, M-W-F, [facility]  . Diabetes mellitus    type 2  . Dysrhythmia    irregular heartbeat  . ESRD (end stage renal disease) on dialysis ([place])    first HD 06/19/04  . GERD (gastroesophageal reflux disease)   . Hyperparathyroidism ([place])   . Morbid obesity ([place])     Prior debridement on 07/21/19 for large necrotic ulceration on the anterior and lateral aspect of the left thigh.  Hx calciphylaxis.  Significant Hospital Events   1/28 Admitted   Consults:   Procedures:  1/28 ETT >>  Significant Diagnostic Tests:  1/28 CTH >> 1. No CT evidence of acute intracranial process. 2. Chronic microvascular ischemic disease and parenchymal volume loss. 3. Diffuse soft tissue edema.  Correlate with hydration status. 4. Extensive intracranial atherosclerosis. 5. Secretions in the ethmoids and posterior nasopharynx are likely to intubation. 6. Mildly proptotic configuration of the globes which is symmetric bilaterally.  Micro Data:  1/28 SARS 2/ Flu A/B >>neg 1/28 BC x 2  Antimicrobials:  1/28 vanc >> 1/28 cefepime >>  Interim  history/subjective:  Currently on 10 mcg levophed via left EJ and another right shoulder PIV.  Very difficult IV access/ known vasculopath   Objective   Blood pressure 104/82, pulse (!) 107, temperature (!) 96.6 F (35.9 C), temperature source Temporal, resp. rate (!) 24, SpO2 100 %.    Vent Mode: PRVC FiO2 (%):  [40 %-100 %] 40 % Set Rate:  [18 bmp] 18 bmp Vt Set:  [550 mL] 550 mL PEEP:  [5 cmH20] 5 cmH20 Plateau Pressure:   [15 cmH20-17 cmH20] 15 cmH20  No intake or output data in the 24 hours ending 07/20/2019 0318 There were no vitals filed for this visit.  Examination: General:  Chronically ill appearing male on MV, no distress HEENT: MM pink/moist, poor dentition, ETT/ OGT- with 500 ml output bilious thus far, pupils 2/reactive Neuro: mostly localizing to pain with RUE, requiring BUE restraints, moving RLL, he did follow commands once- squeezing right hand CV: rr, ST, unable to hear heart sounds due to lung sounds PULM:  Non labored, breathing over vent, diffuse rhonchi/ rales with copious frothy white secretions GI: soft, bs + Extremities: warm, L BKA- extensive calciphylaxis to left thigh and tip of penis -does not appear infected, +2 edema to RLE, dialysis catheter to right thigh Skin: diffuse hyperpigmented areas and old scars   Resolved Hospital Problem list    Assessment & Plan:   Cardiac arrest with ongoing shock, unclear etiology, wide differential Prolonged QTc - prior TTE 05/2019 with LVEF 25-30%, LV global hypokinesis, mild LV hypertrophy, mildly reduced RV function, Indeterminate diastolic filling due to E-A fusion. P:  Admit ICU  Defer TTM as patient is localizing and followed commands briefly in ER Continue levophed for MAP goal 55-60 Very poor access however severe vasculopath and will likely need IR placement of CVL but will assess in ICU first. Avoid QTc prolonging meds  Finishing 1 unit PRBC  Hgb goal > 7 Monitor for bleeding- no obvious source at this time, send hemoccult  Trend troponin/ EKG TTE ordered  Checking lactate, coags, cortisol Will start empiric stress dose steroids Trach and blood culture sent- follow Empiric vanc/ zosyn for now   Acute encephalopathy  - CTH neg P:  Consider EEG if recurrent seizure like activity, no hx of such, some question of seizure with EMS Unable to send UDS - anuric  Trend neuro exams Seizure precautions Ativan prn  seizure   Acute respiratory failure Acute pulmonary edema  P:  Full MV support, PRVC 8 cc/kg, rate reduced to 16- repeated ABG showing reduced PaO2 c/w acute pulmonary edema PEEP limited due to hypotension Nephrology to start CRRT for fluid removal  Prn albuterol  Daily WUA/ SBT PAD protocol with fentanyl gtt, prn versed for RASS goal 0/-1   ESRD- unclear last dialysis, sCr 16 and BUN 121 Uremia  Compensated AGMA   Mild hyperkalemia- without EKG changes  P:  Consulted Nephrology - plans to start CRRT   Chronic anemia  P:  Getting 1 unit as above Transfuse for Hgb >7   DM P:  SSI  Best practice:  Diet: NPO Pain/Anxiety/Delirium protocol (if indicated): Fentanyl/ prn versed VAP protocol (if indicated): yes DVT prophylaxis: heparin SQ GI prophylaxis: PPI Glucose control: SSI Mobility: BR Code Status: Full, did discuss with wife high probabilty for poor outcome based on poor functional status and acute on chronic multisystem organ failure   Family Communication: Wife (separated) April updated at bedside.   Disposition: ICU  Labs   CBC:  Recent Labs  Lab 07/23/2019 0141 07/20/2019 0219  WBC 14.8*  --   NEUTROABS 11.5*  --   HGB 6.4* 7.5*  HCT 23.0* 22.0*  MCV 88.8  --   PLT 520*  --     Basic Metabolic Panel: Recent Labs  Lab 08/10/2019 0141 07/18/2019 0219  NA 139 136  K 5.8* 5.4*  CL 95*  --   CO2 15*  --   GLUCOSE 142*  --   BUN 121*  --   CREATININE 16.09*  --   CALCIUM 7.7*  --   MG 2.3  --    GFR: CrCl cannot be calculated (Unknown ideal weight.). Recent Labs  Lab 07/20/2019 0141  WBC 14.8*    Liver Function Tests: Recent Labs  Lab 08/14/2019 0141  AST 50*  ALT 19  ALKPHOS 91  BILITOT 0.5  PROT 7.7  ALBUMIN 2.2*   No results for input(s): LIPASE, AMYLASE in the last 168 hours. No results for input(s): AMMONIA in the last 168 hours.  ABG    Component Value Date/Time   PHART 7.414 07/26/2019 0219   PCO2ART 29.5 (L) 08/11/2019 0219    PO2ART 421.0 (H) 07/31/2019 0219   HCO3 19.1 (L) 07/26/2019 0219   TCO2 20 (L) 08/09/2019 0219   ACIDBASEDEF 5.0 (H) 08/15/2019 0219   O2SAT 100.0 07/30/2019 0219     Coagulation Profile: No results for input(s): INR, PROTIME in the last 168 hours.  Cardiac Enzymes: No results for input(s): CKTOTAL, CKMB, CKMBINDEX, TROPONINI in the last 168 hours.  HbA1C: No results found for: HGBA1C  CBG: No results for input(s): GLUCAP in the last 168 hours.  Review of Systems:   Unable  Past Medical History  He,  has no past medical history on file.   Surgical History   Unable   Social History    unable   Family History   His family history is not on file.   Allergies Not on File   Home Medications  Prior to Admission medications   Not on File     Critical care time: 65 mins      Kennieth Rad, MSN, AGACNP-BC [facility] 07/16/2019, 6:04 AM

## 2019-08-12 NOTE — ED Triage Notes (Signed)
Pt comes via [facility] called out for unresponsive person from home, found at home by a friend, pt is a dialysis pt,  pt was found with a pulse and apneic, witnessed arrest by EMS, lost pulses, CPR started, ROSC after 3 minutes, given 4mg  of narcan, pt then had had a posturing episode and seizure and received 5mg  versed.

## 2019-08-12 NOTE — Progress Notes (Signed)
Echo attempted at 2:00 pm. HR 138. Will attempt 1/29

## 2019-08-12 NOTE — Progress Notes (Addendum)
NAME:  [name], MRN:  [id number], DOB:  [birth year], LOS: 0 ADMISSION DATE:  07/28/2019, CONSULTATION DATE:  08/02/2019 REFERRING MD:  Dr. Roxanne Mins, CHIEF COMPLAINT:  Cardiac Arrest  Brief History   77 yoM ESRD on iHD (unclear last iHD) found unresponsive then witnessed cardiac arrest with ROSC after 3 mins. Required intubation for ongoing airway protection, ongoing unresponsiveness, and hypotensive requiring vasopressor support. In ER, patient did start to wake up, localize, and reaching for ETT. PCCM to admit.    Past Medical History   . Amputee, below knee, left ([place])   . Anemia   . Blood transfusion   . Bronchitis   . Chest pain   . CHF (congestive heart failure) ([place])   . Chronic kidney disease    dialysis, M-W-F, [facility]  . Diabetes mellitus    type 2  . Dysrhythmia    irregular heartbeat  . ESRD (end stage renal disease) on dialysis ([place])    first HD 06/19/04  . GERD (gastroesophageal reflux disease)   . Hyperparathyroidism ([place])   . Morbid obesity ([place])     Prior debridement on 07/21/19 for large necrotic ulceration on the anterior and lateral aspect of the left thigh.  Hx calciphylaxis.  Significant Hospital Events   1/28 Admitted. On levophed and added vasopressin   Consults:   Procedures:  1/28 ETT >>  Significant Diagnostic Tests:  1/28 CTH >> 1. No CT evidence of acute intracranial process. 2. Chronic microvascular ischemic disease and parenchymal volume loss. 3. Diffuse soft tissue edema.  Correlate with hydration status. 4. Extensive intracranial atherosclerosis. 5. Secretions in the ethmoids and posterior nasopharynx are likely to intubation. 6. Mildly proptotic configuration of the globes which is symmetric Bilaterally.    Micro Data:  1/28 SARS 2/ Flu A/B >>neg 1/28 BC x 2 1/28 MRSA PCR>> Positive.  1/28: Respiratory Culture (Trachial aspirate): Few WBC, Mod Gram + Cocci in pairs/clusters, Mod Gram - rods, rare gram -  coccobacilli.   Antimicrobials:  1/28 vanc >> 1/28 cefepime >>  Interim history/subjective:  Currently on 10 mcg levophed via left EJ and another right shoulder PIV.  Very difficult IV access/ known vasculopath   Objective   Blood pressure (!) 158/135, pulse (!) 148, temperature 98.2 F (36.8 C), temperature source Oral, resp. rate (!) 25, height 5\' 9"  (1.753 m), weight 87.9 kg, SpO2 100 %.    Vent Mode: PRVC FiO2 (%):  [40 %-100 %] 100 % Set Rate:  [16 bmp-18 bmp] 16 bmp Vt Set:  [550 mL] 550 mL PEEP:  [5 cmH20] 5 cmH20 Plateau Pressure:  [15 cmH20-17 cmH20] 15 cmH20   Intake/Output Summary (Last 24 hours) at 08/15/2019 0827 Last data filed at 08/02/2019 0800 Gross per 24 hour  Intake 630.46 ml  Output 136 ml  Net 494.46 ml   Filed Weights   07/17/2019 0628  Weight: 87.9 kg    Examination: General:  Ill appearing, shifting constantly in bed.  HEENT: normocephalic, atraumatic, poor dentition, no appreciable JVP.  Neuro: Intubated and sedated.  CV: tachycardic, RR, no murmurs, rubs, or gallops PULM:  Tachypneic, coarse breath sounds bilaterally, but no apparent wheezes GI: non distended, non tender, BS+,  Extremities: L BKA with extensive ulcerations and calciphylaxis. R lower extremity with calciphylaxis,  Skin: warm, dry, with multiple ulcerations and calciphylaxis.    Resolved Hospital Problem list    Assessment & Plan:   Cardiac Arrest in Setting of HFrEF:  Did not initiate code cool, patient  had purposeful movements. Prior TTE 05/2019 with LVEF 25-30%, LV global hypokinesis, mild LV hypertrophy, mildly reduced RV function, Indeterminate diastolic filling due to E-A fusion. - Continue to monitor tele - Continue to monitor vitals - Continue to perform frequent neuro examinations.   Prolonged QTc: (505) Will continue cardiac monitoring - Will continue to F/U Magnesium and Phosphorus levels and replete as necessary - Avoid QT prolonging drugs.   Multifactorial  Shock:  Hypothermic (95.6 F), leukocytosis (WBC 14), with subsegmental atelectasis in the left mid lung. With additional cardiac shock.  - Continue on Vanc and Zosyn  - Continue to trend vitals, fever curve, and CBC - Continue Levophed with MAP Goal >65 - Ordered Vasopressin  - Ordered 1L LR Bolus followed by slow 1L LR drip  - LA: Pending  Acute Hypoxic Respiratory Failure:  Patient with pulmonary edema and history of HFrEF - Continue Mechanical ventilation  - Re-evaluate after fluid removal - VAP precautions ordered - Continue to monitor vent setting as needed.  - Continue Versed, Fentanyl, and Vecuronium - Follow ABGs  Acute Encephalopathy:  Patient is s/p cardiac arrest with seizure like activity treated with midazolam.  - Will continue to monitor - seizure precautions placed - Continue to monitor for electrolyte imbalances - Frequent neuro checks.   ESRD:  - Appreciate Nephrology's recommendations - Will receive CRRT  Anemia of Chronic Disease:  Patient is ESRD.  - Transfuse Hgb <7 with PRBC.   Diabetes Mellitis Type 2:  - Continue SSI   Best practice:  Diet: NPO Pain/Anxiety/Delirium protocol (if indicated): Fentanyl/ prn versed VAP protocol (if indicated): yes DVT prophylaxis: heparin SQ GI prophylaxis: PPI Glucose control: SSI Mobility: BR Code Status: Full Disposition: ICU  Labs   CBC: Recent Labs  Lab 08/15/2019 0141 07/31/2019 0219 08/06/2019 0525  WBC 14.8*  --   --   NEUTROABS 11.5*  --   --   HGB 6.4* 7.5* 9.2*  HCT 23.0* 22.0* 27.0*  MCV 88.8  --   --   PLT 520*  --   --     Basic Metabolic Panel: Recent Labs  Lab 07/21/2019 0141 07/19/2019 0219 07/25/2019 0525  NA 139 136 136  K 5.8* 5.4* 5.5*  CL 95*  --   --   CO2 15*  --   --   GLUCOSE 142*  --   --   BUN 121*  --   --   CREATININE 16.09*  --   --   CALCIUM 7.7*  --   --   MG 2.3  --   --    GFR: Estimated Creatinine Clearance: 5.7 mL/min (A) (by C-G formula based on SCr of 16.09  mg/dL (H)). Recent Labs  Lab 08/14/2019 0141  WBC 14.8*    Liver Function Tests: Recent Labs  Lab 07/20/2019 0141  AST 50*  ALT 19  ALKPHOS 91  BILITOT 0.5  PROT 7.7  ALBUMIN 2.2*   No results for input(s): LIPASE, AMYLASE in the last 168 hours. No results for input(s): AMMONIA in the last 168 hours.  ABG    Component Value Date/Time   PHART 7.432 08/07/2019 0525   PCO2ART 23.0 (L) 07/17/2019 0525   PO2ART 89.0 08/11/2019 0525   HCO3 16.5 (L) 07/18/2019 0525   TCO2 17 (L) 07/22/2019 0525   ACIDBASEDEF 8.0 (H) 07/18/2019 0525   O2SAT 99.0 08/11/2019 0525     Coagulation Profile: No results for input(s): INR, PROTIME in the last 168 hours.  Cardiac Enzymes:  No results for input(s): CKTOTAL, CKMB, CKMBINDEX, TROPONINI in the last 168 hours.  HbA1C: No results found for: HGBA1C  CBG: No results for input(s): GLUCAP in the last 168 hours.  Review of Systems:   Unable  Allergies Not on File   Home Medications  Prior to Admission medications   Not on File     Maudie Mercury, MD IMTS, PGY-1 Pager: [phone]

## 2019-08-12 NOTE — ED Provider Notes (Signed)
[facility] EMERGENCY DEPARTMENT Provider Note   CSN: [id number] Arrival date & time: 07/17/2019  0119   History Chief Complaint  Patient presents with  . Cardiac Arrest    [name] is a 54 y.o. male.  The history is provided by the EMS personnel. The history is limited by the condition of the patient (Unresponsive).  Cardiac Arrest He was brought in by EMS following a cardiac arrest.  EMS was called because of unresponsive patient at home found by a friend.  He is a dialysis patient unknown when his last dialysis was.  EMS noted he initially had a pulse but with very poor respirations and then lost pulses.  CPR was initiated and return his spontaneous circulation occurred after 3 minutes.  He had been given naloxone.  Just prior to arrival in the ED, he had a posturing episode and a generalized seizure and was given midazolam 5 mg.  No past medical history on file.  There are no problems to display for this patient.   ** The histories are not reviewed yet. Please review them in the "History" navigator section and refresh this [place].     No family history on file.  Social History   Tobacco Use  . Smoking status: Not on file  Substance Use Topics  . Alcohol use: Not on file  . Drug use: Not on file    Home Medications Prior to Admission medications   Not on File    Allergies    Patient has no allergy information on record.  Review of Systems   Review of Systems  Unable to perform ROS: Patient unresponsive    Physical Exam Updated Vital Signs BP (!) 73/51   Pulse (!) 113   Temp (!) 96.6 F (35.9 C) (Temporal)   Resp 17   SpO2 95%   Physical Exam Vitals and nursing note reviewed.   54 year old male who is unresponsive but with spontaneous respirations. Vital signs are significant for elevated heart rate and low blood pressure. Oxygen saturation is 95%, which is normal. Head is normocephalic and atraumatic.  Pupils are 2 mm and  gaze is disconjugate. Oropharynx is clear.  He had retained secretions in the oropharynx and did not have a gag reflex. Neck shows JVD. Lungs are clear without rales, wheezes, or rhonchi. Chest movement is symmetric. Heart has regular rate and rhythm without murmur. Abdomen is soft, flat. Genitalia: Circumcised penis with necrosis noted on the glans penis. Extremities: Dialysis access catheter present in the proximal right thigh.  Scars from previous dialysis access present in the both arms.  Large area of debrided skin with some devitalized muscle seen on the anterolateral aspect of the left thigh.  Left below the knee amputation. Skin is warm and dry.  Multiple hyperpigmented areas seen on the trunk and extremities. Neurologic: GCS=3.    ED Results / Procedures / Treatments   Labs (all labs ordered are listed, but only abnormal results are displayed) Labs Reviewed  COMPREHENSIVE METABOLIC PANEL - Abnormal; Notable for the following components:      Result Value   Potassium 5.8 (*)    Chloride 95 (*)    CO2 15 (*)    Glucose, Bld 142 (*)    BUN 121 (*)    Creatinine, Ser 16.09 (*)    Calcium 7.7 (*)    Albumin 2.2 (*)    AST 50 (*)    GFR calc non Af Amer 3 (*)  GFR calc Af Amer 3 (*)    Anion gap 29 (*)    All other components within normal limits  CBC WITH DIFFERENTIAL/PLATELET - Abnormal; Notable for the following components:   WBC 14.8 (*)    RBC 2.59 (*)    Hemoglobin 6.4 (*)    HCT 23.0 (*)    MCH 24.7 (*)    MCHC 27.8 (*)    RDW 20.0 (*)    Platelets 520 (*)    Neutro Abs 11.5 (*)    Monocytes Absolute 1.1 (*)    Abs Immature Granulocytes 0.28 (*)    All other components within normal limits  POCT I-STAT 7, (LYTES, BLD GAS, ICA,H+H) - Abnormal; Notable for the following components:   pCO2 arterial 29.5 (*)    pO2, Arterial 421.0 (*)    Bicarbonate 19.1 (*)    TCO2 20 (*)    Acid-base deficit 5.0 (*)    Potassium 5.4 (*)    Calcium, Ion 0.92 (*)    HCT  22.0 (*)    Hemoglobin 7.5 (*)    All other components within normal limits  POCT I-STAT 7, (LYTES, BLD GAS, ICA,H+H) - Abnormal; Notable for the following components:   pCO2 arterial 23.0 (*)    Bicarbonate 16.5 (*)    TCO2 17 (*)    Acid-base deficit 8.0 (*)    Potassium 5.5 (*)    Calcium, Ion 0.93 (*)    HCT 27.0 (*)    Hemoglobin 9.2 (*)    All other components within normal limits  TROPONIN I (HIGH SENSITIVITY) - Abnormal; Notable for the following components:   Troponin I (High Sensitivity) 36 (*)    All other components within normal limits  RESPIRATORY PANEL BY RT PCR (FLU A&B, COVID)  CULTURE, BLOOD (ROUTINE X 2)  CULTURE, BLOOD (ROUTINE X 2)  CULTURE, RESPIRATORY  MRSA PCR SCREENING  MAGNESIUM  HIV ANTIBODY (ROUTINE TESTING W REFLEX)  PHOSPHORUS  PROCALCITONIN  LACTIC ACID, PLASMA  CORTISOL  PROTIME-INR  HEMOGLOBIN A1C  BLOOD GAS, ARTERIAL  HEMOGLOBIN AND HEMATOCRIT, BLOOD  RENAL FUNCTION PANEL  PREPARE RBC (CROSSMATCH)  TYPE AND SCREEN  ABO/RH  TROPONIN I (HIGH SENSITIVITY)    EKG EKG Interpretation  Date/Time:  Thursday August 12 2019 01:28:07 EST Ventricular Rate:  120 PR Interval:    QRS Duration: 82 QT Interval:  357 QTC Calculation: 505 R Axis:   81 Text Interpretation: Sinus tachycardia Prolonged PR interval Prominent P waves, nondiagnostic Borderline right axis deviation Probable anteroseptal infarct, old Nonspecific T abnormalities, inferior leads Prolonged QT interval No old tracing to compare Confirmed by Delora Fuel ([id number]) on 07/16/2019 1:35:39 AM   Radiology CT Head Wo Contrast  Result Date: 08/09/2019 CLINICAL DATA:  Cerebral hemorrhage suspected, unresponsive, found by friend post witnessed arrest EXAM: CT HEAD WITHOUT CONTRAST TECHNIQUE: Contiguous axial images were obtained from the base of the skull through the vertex without intravenous contrast. COMPARISON:  None. FINDINGS: Brain: No evidence of acute infarction, hemorrhage,  hydrocephalus, extra-axial collection or mass lesion/mass effect. Symmetric prominence of the ventricles, cisterns and sulci compatible with parenchymal volume loss. Patchy areas of white matter hypoattenuation are most compatible with chronic microvascular angiopathy. Extensive dural calcifications are noted along the falx and tentorium. Vascular: Extensive calcification of the carotid siphons and vertebral arteries. No unexpected hyperdense vessel is evident. Skull: No calvarial fracture or suspicious osseous lesion. No scalp swelling or hematoma. Diffuse scalp edema is seen. Benign scattered scalp calcifications. Sinuses/Orbits: Patient is intubated  at the time of exam with tract transesophageal and endotracheal tubes extending below the level of imaging. Secretions in the ethmoids and posterior nasopharynx are likely related to this intubation. Minimal mucosal thickening in the maxillary sinuses. Prior lens extractions are noted. Mildly proptotic configuration of the globes which is symmetric bilaterally. No abnormal thickening of the extraocular musculature or abnormality of the optic nerve should complexes or superior ophthalmic veins. Other: None IMPRESSION: 1. No CT evidence of acute intracranial process. 2. Chronic microvascular ischemic disease and parenchymal volume loss. 3. Diffuse soft tissue edema.  Correlate with hydration status. 4. Extensive intracranial atherosclerosis. 5. Secretions in the ethmoids and posterior nasopharynx are likely to intubation. 6. Mildly proptotic configuration of the globes which is symmetric bilaterally. Electronically Signed   By: Lovena Le M.D.   On: 08/11/2019 02:43   DG Chest Port 1 View  Result Date: 07/17/2019 CLINICAL DATA:  Post intubation EXAM: PORTABLE CHEST 1 VIEW COMPARISON:  Radiograph February 20, 2018 FINDINGS: Endotracheal tube terminates in the mid trachea, 5 cm from the carina. Transesophageal tube tip and side port distal to the GE junction,  terminating below the level of imaging. Telemetry leads and pacer pads overlie the chest. Some bandlike opacity in the left mid lung likely reflect subsegmental atelectasis. No consolidation, features of edema, pneumothorax, or effusion. The cardiomediastinal contours are unremarkable. No acute osseous or soft tissue abnormality. IMPRESSION: Lines and tubes as above. Subsegmental atelectasis in the left mid lung. No other acute cardiopulmonary abnormality. Electronically Signed   By: Lovena Le M.D.   On: 08/11/2019 01:59    Procedures Angiocath insertion  Date/Time: 08/03/2019 1:59 AM Performed by: Delora Fuel, MD Authorized by: Delora Fuel, MD  Consent: The procedure was performed in an emergent situation. Site marked: the operative site was marked Imaging studies: imaging studies not available Required items: required blood products, implants, devices, and special equipment available Patient identity confirmed: verbally with patient and arm band Time out: Immediately prior to procedure a "time out" was called to verify the correct patient, procedure, equipment, support staff and site/side marked as required. Preparation: Patient was prepped and draped in the usual sterile fashion. Local anesthesia used: no  Anesthesia: Local anesthesia used: no  Sedation: Patient sedated: no  Patient tolerance: patient tolerated the procedure well with no immediate complications Comments: 16 gauge IV catheter placed in the left external jugular vein. Patient tolerated procedure well.  Procedure Name: Intubation Date/Time: 07/18/2019 1:45 AM Performed by: Delora Fuel, MD Pre-anesthesia Checklist: Patient identified, Patient being monitored, Emergency Drugs available, Timeout performed and Suction available Oxygen Delivery Method: Non-rebreather mask Preoxygenation: Pre-oxygenation with 100% oxygen Induction Type: Rapid sequence Ventilation: Mask ventilation without difficulty Laryngoscope  Size: Glidescope and 3 Grade View: Grade I Tube size: 7.5 mm Airway Equipment and Method: Video-laryngoscopy and Rigid stylet Placement Confirmation: ETT inserted through vocal cords under direct vision,  CO2 detector and Breath sounds checked- equal and bilateral Secured at: 24 cm Tube secured with: ETT holder Dental Injury: Teeth and Oropharynx as per pre-operative assessment      CRITICAL CARE Performed by: Delora Fuel Total critical care time: 115 minutes Critical care time was exclusive of separately billable procedures and treating other patients. Critical care was necessary to treat or prevent imminent or life-threatening deterioration. Critical care was time spent personally by me on the following activities: development of treatment plan with patient and/or surrogate as well as nursing, discussions with consultants, evaluation of patient's response to treatment, examination of patient,  obtaining history from patient or surrogate, ordering and performing treatments and interventions, ordering and review of laboratory studies, ordering and review of radiographic studies, pulse oximetry and re-evaluation of patient's condition.  Medications Ordered in ED Medications  0.9 %  sodium chloride infusion (1,000 mLs Intravenous New Bag/Given 07/29/2019 0135)  etomidate (AMIDATE) injection (20 mg Intravenous Given 07/17/2019 0129)  succinylcholine (ANECTINE) injection (125 mg Intravenous Given 07/30/2019 0129)    ED Course  I have reviewed the triage vital signs and the nursing notes.  Pertinent labs & imaging results that were available during my care of the patient were reviewed by me and considered in my medical decision making (see chart for details).    MDM Rules/Calculators/A&P Cardiac arrest.  Altered mental status.  Overall picture is concerning for intracranial hemorrhage.  He did require IV fluids to get an adequate, blood pressure.  He was emergently intubated to protect his airway.   Portable chest x-ray has been ordered post intubation he will be sent for CT of head.  Additional screening labs were ordered.  CT of head shows no evidence of bleeding or other acute process.  Chest x-ray shows appropriate tube position, no acute cardiopulmonary process.  Labs show hemoglobin 6.4.  Prior records are reviewed and hemoglobin had been 9.9 on January 6.  Blood pressure did drop after initial response to fluids and he was started on norepinephrine drip and blood was ordered for transfusion.  Potassium is mildly elevated at 5.8.  COVID-19 test is negative.  Case is discussed with Dr. Lucile Shutters of critical care service who agrees to admit the patient.  Case also discussed with Dr. Aundra Dubin of cardiology service who states he will be glad to see the patient if requested.  I have contacted the patient's daughter to inform her of patient's condition.  Final Clinical Impression(s) / ED Diagnoses Final diagnoses:  Cardiac arrest ([place])  Coma, unspecified coma depth ([place])  End-stage renal disease on hemodialysis ([place])  Idiopathic hypotension  Hyperkalemia  Anemia due to chronic kidney disease, on chronic dialysis [facility])    Rx / DC Orders ED Discharge Orders    None       Delora Fuel, MD [phone]

## 2019-08-12 NOTE — ED Notes (Signed)
Patient [name] daughter calling asking for an update on patient [phone]

## 2019-08-12 NOTE — ED Notes (Signed)
Paged chaplain family in consultation rm A--Rickelle Sylvestre

## 2019-08-12 NOTE — Consult Note (Signed)
[facility] Renal Consultation Note  Requesting MD: Seward Carol MD  Indication for Consultation:  ESRD  Chief complaint: unresponsive at home  HPI [name] is a 54 y.o. male with a history of end-stage renal disease, diabetes, secondary hyperparathyroidism calciphylaxis and peripheral vascular disease who was brought to the hospital after he was found unresponsive and with agonal respirations.  Patient arrested and rhythm uncertain.  He received CPR.  Obtained ROSC.  He received fluids in the ER.  He dialyzes through a catheter in the right groin.  He has been on Levophed at 10 which has been increased to 15 mics per minute.  Pulmonology is consulting for dialysis needs to optimize his cardiopulmonary status in light of worsening pulmonary edema.  He is reported as having generalized seizure activity and was given midazolam for the same.  He last dialyzed on 1/26 with departure weight of 88.5 kg relative to a dry weight of 86 kg.  The prior treatment on 1/22 he left 85.9 kg.   PMHx: End-stage renal disease Left below-knee amputation Anemia Diabetes mellitus GERD Secondary hyperparathyroidism Obesity  Family Hx: Mother with kidney disease, heart disease, and hypertension  Social History: Prior tobacco abuse.  Unable to obtain history regarding alcohol or drug use given patient intubated status post arrest  Allergies:  Previously recorded as  Allergies  Allergen Reactions  . Hydrocodone Nausea And Vomiting  . Dilaudid [Hydromorphone Hcl] Itching  . Fentanyl Itching  . Amlodipine Itching  . Dacarbazine Nausea And Vomiting  . Propofol Itching  . Hydrocodone-Acetaminophen Nausea And Vomiting    Medications: Prior to admission medications are being obtained for review  Labs:  BMP Latest Ref Rng & Units 07/27/2019 07/27/2019 07/25/2019  Glucose 70 - 99 mg/dL - - 142(H)  BUN 6 - 20 mg/dL - - 121(H)  Creatinine 0.61 - 1.24 mg/dL - - 16.09(H)  Sodium 135  - 145 mmol/L 136 136 139  Potassium 3.5 - 5.1 mmol/L 5.5(H) 5.4(H) 5.8(H)  Chloride 98 - 111 mmol/L - - 95(L)  CO2 22 - 32 mmol/L - - 15(L)  Calcium 8.9 - 10.3 mg/dL - - 7.7(L)    ROS:  Unable to obtain secondary to patient intubated post arrest  Physical Exam: Vitals:   07/18/2019 0425 08/07/2019 0436  BP: (!) 70/59 (!) 134/122  Pulse: (!) 148   Resp: (!) 32 (!) 22  Temp: (!) 95.6 F (35.3 C)   SpO2:       General: Adult male in stretcher critically ill HEENT: ET tube is in place normocephalic Eyes: Eyes initially closed; pupils pinpoint status post sedation Neck: Trachea is midline increased neck circumference Heart: Tachycardic no rub appreciated on 15 mics per minute of levo Lungs: Coarse mechanical breath sounds on vent Abdomen: Soft obese habitus reduced bowel sounds Extremities: 1+ edema right lower extremity with left below the knee amputation Skin: Scattered excoriations on lower extremities lesion right foot and large chronic appearing nonhealing wound of left thigh Neuro: Sedation continuously running  Access right femoral catheter in place  [facility] Monday Wednesday Friday HD Right groin tunneled catheter 450 blood flow 800 dialysate flow 2K 2 calcium bath 86 kg dry weight mircera 200 mcg every 2 weeks last on 1/16 Sodium thiosulfate 25% 25 gram every treatment  Weekly venofer parsabiv 2.5 mg three times a week   Assessment/Plan:  # Cardiac arrest - Supportive care per pulm critical care.  They do not plan for hypothermia protocol  # Acute hypoxic  respiratory failure - Initiate CRRT to optimize cardiopulmonary status - Vent per pulmonology/critcial care  # End-stage renal disease - Normally reported as MWF schedule  - Starting CRRT as above - Net neg 50 an hour as tolerated   # Shock of unclear etiology  - Patient is on Levophed at this time per critical care  # Anemia acute on chronic - Status post PRBCs - last mircera was on 1/16 at 200  mcg - Due 1/30  # Acute encephalopathy  - s/p arrest  - also with report of seizure activity and s/p midazolam  - Per critical care  - optimize metabolic abnormalities - starting CRRT   # Calciphylaxis - will need sodium thiosulfate - ordered for MWF for now  # Secondary hyperparathyroidism  - No activated vit D with calphylasix  - on parsabiv outpatient  - Trend calcium post arrest and reinitiate parsabiv vs sensipar as available here   Claudia Desanctis 07/29/2019, 6:39 AM

## 2019-08-12 NOTE — Consult Note (Signed)
Responded to page, joined [employer] with pt's wife, who appeared to understand pt's status. After doctor left, offered emotional support. Pt said she'd prayed at home and would appreciate my prayer for pt, but then she wanted to call her daughter back. She appreciated the small pottery hearts I gave her that she and her daughter could hold and pray for pt. Staff will page again if further need of chaplain services.  Rev. Eloise Levels Chaplain

## 2019-08-12 NOTE — Procedures (Signed)
Central Venous Catheter Insertion Procedure Note JERMY ALLIE [id number] [birth year]  Procedure: Insertion of Central Venous Catheter Indications: Drug and/or fluid administration  Procedure Details Consent: Unable to obtain consent because of emergent medical necessity. Time Out: Verified patient identification, verified procedure, site/side was marked, verified correct patient position, special equipment/implants available, medications/allergies/relevent history reviewed, required imaging and test results available.  Performed  Maximum sterile technique was used including antiseptics, cap, gloves, gown, hand hygiene, mask and sheet. Skin prep: Chlorhexidine; local anesthetic administered A antimicrobial bonded/coated triple lumen catheter was placed in the left femoral  vein using the Seldinger technique.  Very difficult placement requiring multiple passes.  Evaluation Blood flow good Complications: No apparent complications Patient did tolerate procedure well. Chest X-ray ordered to verify placement.  n/a Gurshaan Matsuoka 08/10/2019, 6:29 PM

## 2019-08-12 NOTE — Progress Notes (Signed)
Pharmacy Antibiotic Note  [name] is a 54 y.o. male admitted on 08/10/2019 with sepsis.  Pharmacy has been consulted for Vancomycin/Zosyn dosing. S/P brief cardiac arrest. WBC elevated. ESRD on HD MWF, now started on CRRT this morning with ongoing hypotension.  Plan Vancomycin 2000mg  IV x1 then 750mg  IV q24h Zosyn 3.375g IV q6h F/U tolerance of CRRT, cultures, LOT   Temp (24hrs), Avg:96.8 F (36 C), Min:95.6 F (35.3 C), Max:98.4 F (36.9 C)  Recent Labs  Lab 08/11/2019 0141  WBC 14.8*  CREATININE 16.09*    Estimated Creatinine Clearance: 5.7 mL/min (A) (by C-G formula based on SCr of 16.09 mg/dL (H)).    Allergies  Allergen Reactions  . Amlodipine Itching  . Dilaudid [Hydromorphone] Itching  . Fentanyl Itching  . Hydrocodone Nausea And Vomiting    Arrie Senate, PharmD, BCPS Clinical Pharmacist [phone] Please check AMION for all [facility] numbers 07/20/2019

## 2019-08-13 ENCOUNTER — Inpatient Hospital Stay (HOSPITAL_COMMUNITY): Payer: Medicare Other

## 2019-08-13 DIAGNOSIS — I469 Cardiac arrest, cause unspecified: Secondary | ICD-10-CM

## 2019-08-13 LAB — RENAL FUNCTION PANEL
Albumin: 1.8 g/dL — ABNORMAL LOW (ref 3.5–5.0)
Albumin: 1.6 g/dL — ABNORMAL LOW (ref 3.5–5.0)
Anion gap: 25 — ABNORMAL HIGH (ref 5–15)
Anion gap: 20 — ABNORMAL HIGH (ref 5–15)
BUN: 66 mg/dL — ABNORMAL HIGH (ref 6–20)
BUN: 48 mg/dL — ABNORMAL HIGH (ref 6–20)
CO2: 14 mmol/L — ABNORMAL LOW (ref 22–32)
CO2: 14 mmol/L — ABNORMAL LOW (ref 22–32)
Calcium: 8.1 mg/dL — ABNORMAL LOW (ref 8.9–10.3)
Calcium: 6.7 mg/dL — ABNORMAL LOW (ref 8.9–10.3)
Chloride: 99 mmol/L (ref 98–111)
Chloride: 110 mmol/L (ref 98–111)
Creatinine, Ser: 8.08 mg/dL — ABNORMAL HIGH (ref 0.61–1.24)
Creatinine, Ser: 5.05 mg/dL — ABNORMAL HIGH (ref 0.61–1.24)
GFR calc Af Amer: 8 mL/min — ABNORMAL LOW (ref >60)
GFR calc Af Amer: 14 mL/min — ABNORMAL LOW (ref >60)
GFR calc non Af Amer: 7 mL/min — ABNORMAL LOW (ref >60)
GFR calc non Af Amer: 12 mL/min — ABNORMAL LOW (ref >60)
Glucose, Bld: 177 mg/dL — ABNORMAL HIGH (ref 70–99)
Glucose, Bld: 227 mg/dL — ABNORMAL HIGH (ref 70–99)
Phosphorus: 8.8 mg/dL — ABNORMAL HIGH (ref 2.5–4.6)
Phosphorus: 5.9 mg/dL — ABNORMAL HIGH (ref 2.5–4.6)
Potassium: 6.3 mmol/L (ref 3.5–5.1)
Potassium: 4.6 mmol/L (ref 3.5–5.1)
Sodium: 138 mmol/L (ref 135–145)
Sodium: 144 mmol/L (ref 135–145)

## 2019-08-13 LAB — APTT: aPTT: 44 seconds — ABNORMAL HIGH (ref 24–36)

## 2019-08-13 LAB — LACTIC ACID, PLASMA
Lactic Acid, Venous: 3.5 mmol/L (ref 0.5–1.9)
Lactic Acid, Venous: 1.8 mmol/L (ref 0.5–1.9)

## 2019-08-13 LAB — BPAM RBC
Blood Product Expiration Date: 202102142359
Blood Product Expiration Date: 202102142359
ISSUE DATE / TIME: 202101280401
ISSUE DATE / TIME: 202101281515
Unit Type and Rh: 6200
Unit Type and Rh: 6200

## 2019-08-13 LAB — TYPE AND SCREEN
ABO/RH(D): A POS
Antibody Screen: NEGATIVE
Unit division: 0
Unit division: 0

## 2019-08-13 LAB — POCT ACTIVATED CLOTTING TIME
Activated Clotting Time: 131 seconds
Activated Clotting Time: 131 seconds
Activated Clotting Time: 136 seconds
Activated Clotting Time: 136 seconds
Activated Clotting Time: 136 seconds
Activated Clotting Time: 142 seconds
Activated Clotting Time: 142 seconds
Activated Clotting Time: 142 seconds
Activated Clotting Time: 142 seconds

## 2019-08-13 LAB — CBC
HCT: 30.1 % — ABNORMAL LOW (ref 39.0–52.0)
Hemoglobin: 8.3 g/dL — ABNORMAL LOW (ref 13.0–17.0)
MCH: 25.9 pg — ABNORMAL LOW (ref 26.0–34.0)
MCHC: 27.6 g/dL — ABNORMAL LOW (ref 30.0–36.0)
MCV: 93.8 fL (ref 80.0–100.0)
Platelets: 397 10*3/uL (ref 150–400)
RBC: 3.21 MIL/uL — ABNORMAL LOW (ref 4.22–5.81)
RDW: 18.9 % — ABNORMAL HIGH (ref 11.5–15.5)
WBC: 17.9 10*3/uL — ABNORMAL HIGH (ref 4.0–10.5)
nRBC: 0.1 % (ref 0.0–0.2)

## 2019-08-13 LAB — GLUCOSE, CAPILLARY
Glucose-Capillary: 163 mg/dL — ABNORMAL HIGH (ref 70–99)
Glucose-Capillary: 186 mg/dL — ABNORMAL HIGH (ref 70–99)
Glucose-Capillary: 202 mg/dL — ABNORMAL HIGH (ref 70–99)
Glucose-Capillary: 242 mg/dL — ABNORMAL HIGH (ref 70–99)
Glucose-Capillary: 257 mg/dL — ABNORMAL HIGH (ref 70–99)

## 2019-08-13 LAB — ECHOCARDIOGRAM COMPLETE
Height: 69 in
Weight: 3100.55 oz

## 2019-08-13 LAB — MAGNESIUM: Magnesium: 2.5 mg/dL — ABNORMAL HIGH (ref 1.7–2.4)

## 2019-08-13 LAB — CORTISOL: Cortisol, Plasma: 61.9 ug/dL

## 2019-08-13 LAB — POTASSIUM
Potassium: 6.2 mmol/L — ABNORMAL HIGH (ref 3.5–5.1)
Potassium: 5.2 mmol/L — ABNORMAL HIGH (ref 3.5–5.1)

## 2019-08-13 MED ORDER — PRISMASOL BGK 0/2.5 32-2.5 MEQ/L IV SOLN
INTRAVENOUS | Status: DC
  Filled 2019-08-13 (×38): qty 5000

## 2019-08-13 MED ORDER — CHLORHEXIDINE GLUCONATE CLOTH 2 % EX PADS
6.0000 | MEDICATED_PAD | Freq: Every day | CUTANEOUS | Status: DC
  Administered 2019-08-13 – 2019-08-24 (×11): 6 via TOPICAL

## 2019-08-13 MED ORDER — PRO-STAT SUGAR FREE PO LIQD
60.0000 mL | Freq: Three times a day (TID) | ORAL | Status: DC
  Administered 2019-08-13 – 2019-08-17 (×14): 60 mL
  Filled 2019-08-13 (×13): qty 60

## 2019-08-13 MED ORDER — PRISMASOL BGK 0/2.5 32-2.5 MEQ/L IV SOLN
INTRAVENOUS | Status: DC
  Filled 2019-08-13 (×10): qty 5000

## 2019-08-13 MED ORDER — B COMPLEX-C PO TABS
1.0000 | ORAL_TABLET | Freq: Every day | ORAL | Status: DC
  Administered 2019-08-13 – 2019-08-17 (×5): 1
  Filled 2019-08-13 (×7): qty 1

## 2019-08-13 MED ORDER — ALBUMIN HUMAN 25 % IV SOLN
25.0000 g | Freq: Once | INTRAVENOUS | Status: AC
  Administered 2019-08-13: 25 g via INTRAVENOUS
  Filled 2019-08-13: qty 50

## 2019-08-13 MED ORDER — HYDROXYZINE HCL 10 MG/5ML PO SYRP
25.0000 mg | ORAL_SOLUTION | Freq: Three times a day (TID) | ORAL | Status: DC | PRN
  Administered 2019-08-13 – 2019-08-15 (×3): 25 mg
  Filled 2019-08-13 (×5): qty 12.5

## 2019-08-13 MED ORDER — GABAPENTIN 250 MG/5ML PO SOLN
200.0000 mg | Freq: Two times a day (BID) | ORAL | Status: DC
  Administered 2019-08-13 – 2019-08-17 (×10): 200 mg
  Filled 2019-08-13 (×13): qty 4

## 2019-08-13 MED ORDER — VITAL 1.5 CAL PO LIQD
1000.0000 mL | ORAL | Status: DC
  Administered 2019-08-14 – 2019-08-16 (×3): 1000 mL
  Filled 2019-08-13 (×7): qty 1000

## 2019-08-13 NOTE — Progress Notes (Signed)
[facility] Progress Note Patient Name: [name] DOB: 1965/07/28 MRN: [id number]   Date of Service  08/13/2019  HPI/Events of Note  PT with hypotension on CRRT  eICU Interventions  Vasopressin order changed to 0.01 to 0.04 to allow titration for MAP goal of 65 mmHg to support ultrafiltration, Levophed ceiling increased to 50 mcg as needed to support ultrafiltration.        [name] Ogan 08/13/2019, 5:05 AM

## 2019-08-13 NOTE — Progress Notes (Signed)
[facility] Progress Note  Subjective: + 4L yest, 5.6 L in and 1.6 L UF w/ CRRT.  FiO2 0.4 on vent (down), on levo/ vaso gtts. K up 6.3  Vitals:   08/13/19 0600 08/13/19 0615 08/13/19 0630 08/13/19 0645  BP: (!) 74/47 (!) 78/50 (!) 87/50 (!) 101/53  Pulse:      Resp: '12 11 12 11  ' Temp:      TempSrc:      SpO2:    100%  Weight:      Height:        Exam: General: AAM on vent, sedated HEENT: ET tube  Eyes: Eyes closed Cor: RRR no RG Lungs: Coarse mechanical breath sounds on vent Abdomen: Soft obese habitus reduced bowel sounds Extremities: 1+ edema right lower extremity with left below the knee amputation Skin: large necrotic wound left lateral upper leg scooped out w/ much tissue loss; distal L BKA stump tissue is necrotic as well and there is penile gangrene on the glans Neuro: Sedated on vent Access right femoral TDC in place  [facility] MWF   R fem TDC   450/800  2/2 bath  86kg   -mircera 200 mcg every 2 weeks last on 1/16 -Sodium thiosulfate 25 gram every treatment  -Weekly venofer -parsabiv 2.5 mg three times a week    CXR 1/28 - no active disease  Assessment/Plan:  # Cardiac arrest - Supportive care per pulm critical care.  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, poor prognosis; pt is DNR  # Shock of unclear etiology  - remains on levo/ vaso gtt's  # Anemia acute on chronic - Status post PRBCs - last mircera was on 1/16 at 200 mcg - Due 1/30  # Acute encephalopathy  - s/p arrest  - also with report of seizure activity and s/p midazolam  - Per critical care  - optimize metabolic  abnormalities - starting CRRT   # Calciphylaxis - cont tiw sodium thiosulfate  # Secondary hyperparathyroidism  - No activated vit D with calciphylaxis - Trend calcium post arrest and reinitiate parsabiv vs sensipar as available here    Kelly Splinter 08/13/2019, 7:39 AM  Inpatient medications: . chlorhexidine gluconate (MEDLINE KIT)  15 mL Mouth Rinse BID  . Chlorhexidine Gluconate Cloth  6 each Topical Daily  . [START ON 08/14/2019] darbepoetin (ARANESP) injection - DIALYSIS  150 mcg Intravenous Q Sat-HD  . feeding supplement (VITAL HIGH PROTEIN)  1,000 mL Per Tube Q24H  . fentaNYL (SUBLIMAZE) injection  25 mcg Intravenous Once  . gabapentin  200 mg Per Tube Q12H  . hydrocortisone sod succinate (SOLU-CORTEF) inj  50 mg Intravenous Q6H  . insulin aspart  0-6 Units Subcutaneous Q4H  . mouth rinse  15 mL Mouth Rinse 10 times per day  . mouth rinse  15 mL Mouth Rinse 10 times per day  . pantoprazole (PROTONIX) IV  40 mg Intravenous QHS  . sodium chloride flush  10-40 mL Intracatheter Q12H  . vecuronium  10 mg Intravenous Once   .  prismasol BGK 4/2.5 400 mL/hr at 08/13/19 0019  .  prismasol BGK 4/2.5 300 mL/hr at 07/22/2019 1059  . fentaNYL infusion INTRAVENOUS 325 mcg/hr (08/13/19 0600)  . heparin 10,000 units/ 20 mL infusion syringe 1,850 Units/hr (08/13/19 0553)  . midazolam 0.5 mg/hr (08/13/19 0600)  . norepinephrine (LEVOPHED) Adult infusion 45 mcg/min (08/13/19 0600)  . piperacillin-tazobactam Stopped (08/13/19 0535)  . prismasol BGK 4/2.5 1,800 mL/hr at 08/13/19 0402  . sodium thiosulfate infusion for calciphylaxis    . vancomycin    . vasopressin (PITRESSIN) infusion - *FOR SHOCK* 0.04 Units/min (08/13/19 0600)   albuterol, bisacodyl, fentaNYL, heparin, heparin, influenza vac split quadrivalent PF, LORazepam, midazolam, midazolam, pneumococcal 23 valent vaccine, sodium chloride flush Recent Labs  Lab 08/02/2019 0141 07/19/2019 0219 07/30/2019 1424 07/16/2019 1610  08/13/19 0357  NA 139   < > 135  --  138  K 5.8*   < > 5.2*  --  6.3*  CL 95*  --   --   --  99  CO2 15*  --   --   --  14*  GLUCOSE 142*  --   --   --  177*  BUN 121*  --   --   --  66*  CREATININE 16.09*  --   --   --  8.08*  CALCIUM 7.7*  --   --   --  8.1*  PHOS  --   --   --  8.3* 8.8*   < > = values in this interval not displayed.

## 2019-08-13 NOTE — Progress Notes (Signed)
CRITICAL VALUE ALERT  Critical Value: Potassium 6.3, Lactic Acid 3.5  Date & Time Notied:  08/13/2019 0544  Provider Notified: Dr. Lucile Shutters  Orders Received/Actions taken: Orders received. See MAR.

## 2019-08-13 NOTE — Progress Notes (Signed)
[facility] Progress Note Patient Name: [name] DOB: 1966/05/25 MRN: [id number]   Date of Service  08/13/2019  HPI/Events of Note  Lactate 3.5, K+ 6.3  eICU Interventions  Albumin 25 % 25 gm iv x 1, continue CRRT for K+ clearance.        [name] [name] 08/13/2019, 5:54 AM

## 2019-08-13 NOTE — Progress Notes (Signed)
  Echocardiogram 2D Echocardiogram has been performed.  [name] G Careen Mauch 08/13/2019, 9:30 AM

## 2019-08-13 NOTE — Progress Notes (Addendum)
Nutrition Follow up  DOCUMENTATION CODES:   Not applicable  INTERVENTION:   Add b complex with Vitamin C to account for losses from CRRT  Tube feeding:  -Vital 1.5 @ 35 ml/hr via OGT (840 ml)  -60 ml Prostat TID  Provides: 1860 kcals, 147 grams protein, 1351 mg phosphorus, 1843 mg potassium, 642 ml free water.    NUTRITION DIAGNOSIS:   Inadequate oral intake related to inability to eat as evidenced by NPO status.  GOAL:   Patient will meet greater than or equal to 90% of their needs   MONITOR:   TF tolerance, Labs, Weight trends, I & O's, Skin, Vent status  REASON FOR ASSESSMENT:   Ventilator    ASSESSMENT:   Pt with a PMH significant for L BKA, ESRD, DM, GERD, secondary hyperparathyroidism calciphylaxis and PVD who was brought to the ED s/p cardiac arrest.   1/28- start CRRT   RD working remotely.  No TTM. Made DNR yesterday. Continues on CRRT. Hyperkalemia continues (plan to change to 0K dialysate) Tolerating Vital High Protein @ 40 ml/hr. Will change formula to better meet pt needs.    Per RN, CBGs elevated upon initiation of TF. Last five CBGs resulted as 133, 143, 163, 177, 202. ICU CBG range is 140-180. Pt may require long acting insulin if CBGs continue to trend up.   EDW: 86 kg  Current weight: 87.9 kg (taken yesterday)   Patient is currently intubated on ventilator support MV: 11.9 L/min Temp (24hrs), Avg:97.9 F (36.6 C), Min:97.1 F (36.2 C), Max:98.9 F (37.2 C)   I/O: +4,736 ml since admit  CRRT: 1,577 ml   Drips: versed, levophed, sodium thiosulfate, vasopressin  Medications: aranesp, solucortef, SS novolog Labs: K 6.2 (H) Phosphorus 8.8 (H) Mg 2.5 (H) corrected calcium 9.9 (wdl)  Diet Order:   Diet Order            Diet NPO time specified  Diet effective now              EDUCATION NEEDS:   Not appropriate for education at this time  Skin:  Skin Assessment: Skin Integrity Issues: Skin Integrity Issues:: Other  (Comment) Other: calciphylaxis- penis, L thigh  Last BM:  1/28  Height:   Ht Readings from Last 1 Encounters:  08/15/2019 5\' 9"  (1.753 m)    Weight:   Wt Readings from Last 1 Encounters:  08/15/2019 87.9 kg    Ideal Body Weight:  67.97 kg  BMI:  Body mass index is 28.62 kg/m.  Estimated Nutritional Needs:   Kcal:  1872 kcal  Protein:  135-150 grams  Fluid:  1000 ml + UOP (liberalized with CRRT)   Mariana Single RD, LDN Clinical Nutrition Pager # [phone]

## 2019-08-13 NOTE — Progress Notes (Signed)
NAME:  [name], MRN:  [id number], DOB:  1965/09/17, LOS: 1 ADMISSION DATE:  07/18/2019, CONSULTATION DATE:  07/23/2019 REFERRING MD:  Dr. Roxanne Mins, CHIEF COMPLAINT:  Cardiac Arrest  Brief History   43 yoM ESRD on iHD (unclear last iHD) found unresponsive then witnessed cardiac arrest with ROSC after 3 mins. Required intubation for ongoing airway protection, ongoing unresponsiveness, and hypotensive requiring vasopressor support. In ER, patient did start to wake up, localize, and reaching for ETT. Past Medical History   . Amputee, below knee, left ([place])   . Anemia   . Blood transfusion   . Bronchitis   . Chest pain   . CHF (congestive heart failure) ([place])   . Chronic kidney disease    dialysis, M-W-F, [facility]  . Diabetes mellitus    type 2  . Dysrhythmia    irregular heartbeat  . ESRD (end stage renal disease) on dialysis ([place])    first HD 06/19/04  . GERD (gastroesophageal reflux disease)   . Hyperparathyroidism ([place])   . Morbid obesity ([place])     Prior debridement on 07/21/19 for large necrotic ulceration on the anterior and lateral aspect of the left thigh.  Hx calciphylaxis.  Significant Hospital Events   1/28 Admitted. On levophed and added vasopressin  1/29: O/N Hyperkalemic, CRRT for correction also GOC discussion with family, made DNR. Still on levophed and multiple analgesics/sedatives.    Consults:   Procedures:  1/28 ETT >> 1/28 L Femoral>> 1/28 L external Jugular>> 1/28 R upper arm peripheral>> Significant Diagnostic Tests:  1/28 CTH >> 1. No CT evidence of acute intracranial process. 2. Chronic microvascular ischemic disease and parenchymal volume loss. 3. Diffuse soft tissue edema.  Correlate with hydration status. 4. Extensive intracranial atherosclerosis. 5. Secretions in the ethmoids and posterior nasopharynx are likely to intubation. 6. Mildly proptotic configuration of the globes which is symmetric Bilaterally. Micro Data:  1/28  SARS 2/ Flu A/B >>NGTD 1/28 BC x 2>>NGTD 1/28 MRSA PCR>> Positive.  1/28: Respiratory Culture (Trachial aspirate): Few WBC, Mod Gram + Cocci in pairs/clusters, Mod Gram - rods, rare gram - coccobacilli. 1/29 moderate staph aureus.   Antimicrobials:  1/28 vanc >> 1/28 cefepime >>  Interim history/subjective:  O/N: Family discussion held yesterday with patient's spouse. His code status was changed to DNR. S: Patient intubated and sedated.   Objective   Blood pressure (!) 101/53, pulse (!) 101, temperature 98.2 F (36.8 C), temperature source Oral, resp. rate 11, height 5\' 9"  (1.753 m), weight 87.9 kg, SpO2 100 %.    Vent Mode: PRVC FiO2 (%):  [40 %-100 %] 40 % Set Rate:  [16 bmp] 16 bmp Vt Set:  [550 mL] 550 mL PEEP:  [5 cmH20] 5 cmH20 Plateau Pressure:  [17 cmH20] 17 cmH20   Intake/Output Summary (Last 24 hours) at 08/13/2019 0724 Last data filed at 08/13/2019 0600 Gross per 24 hour  Intake 5617.82 ml  Output 1577 ml  Net 4040.82 ml   Filed Weights   07/26/2019 0628  Weight: 87.9 kg  Examination: General:  Ill appearing, intubated and sedated.  HEENT: normocephalic, atraumatic, no appreciable JVP.  Neuro: Intubated and sedated.  CV: tachycardic, RR, no murmurs, rubs, or gallops PULM:  Tachypneic, coarse breath sounds bilaterally, but no apparent wheezes GI: non distended, non tender, BS+,  Extremities: L BKA with extensive ulcerations and calciphylaxis. R lower extremity with calciphylaxis,  Skin: warm, dry, with multiple ulcerations and calciphylaxis.  Resolved Hospital Problem list    Assessment &  Plan:   Cardiac Arrest in Setting of HFrEF:  Did not initiate code cool, patient had purposeful movements. Prior TTE 05/2019 with LVEF 25-30%, LV global hypokinesis, mild LV hypertrophy, mildly reduced RV function, Indeterminate diastolic filling due to E-A fusion. - Continue to monitor tele - Continue to monitor vitals - Continue to perform frequent neuro examinations.    Prolonged QTc: (505) Will continue cardiac monitoring - Will continue to F/U Magnesium and Phosphorus levels and replete as necessary - Avoid QT prolonging drugs.   Multifactorial Shock:  Hypothermic (95.6 F), leukocytosis (WBC 14), with subsegmental atelectasis in the left mid lung. With additional cardiac shock with severe L ventricular dysfunction .  - Continue Vanc and Zosyn  - Continue to trend vitals, fever curve, and CBC - Continue Levophed with MAP Goal >65  - LA: 3.5 - WBC: 17.9 could be secondary to high dose steroid - Respiratory cultures grew S. Aureus, susceptibilities to follow.  - On SoluCortef 100 mg Q6H.  Acute Hypoxic Respiratory Failure:  Patient with pulmonary edema and history of HFrEF - Continue Mechanical ventilation  - Re-evaluate after fluid removal - VAP precautions - Continue to monitor vent setting as needed.  - Continue Versed, Fentanyl, and Vecuronium - Follow ABGs  Acute Encephalopathy:  Patient is s/p cardiac arrest with seizure like activity treated with midazolam.  - Will continue to monitor - seizure precautions placed - Continue to monitor for electrolyte imbalances - Frequent neuro checks.   Calciphylaxis:  Patient with ESRD and PMH of calciphylaxis. When seen 1/28, patient was still writhing in bed and required additional sedation/analgesia.  - Continue Versed Infusion - Continue fentanyl infusion - Continue gabapentin.   ESRD:  - Appreciate Nephrology's recommendations - Will receive CRRT - Will continue to correct electrolyte imbalances as they arise.   Anemia of Chronic Disease:  Patient is ESRD.  - Transfuse Hgb <7 with PRBC.   Diabetes Mellitis Type 2:  - Continue SSI   Best practice:  Diet: NPO Pain/Anxiety/Delirium protocol (if indicated): Fentanyl/ prn versed VAP protocol (if indicated): yes DVT prophylaxis: heparin SQ GI prophylaxis: PPI Glucose control: SSI Mobility: BR Code Status: DNR Disposition:  ICU  Labs   CBC: Recent Labs  Lab 08/11/2019 0141 07/26/2019 0141 08/07/2019 0219 08/13/2019 0525 08/11/2019 1424 07/23/2019 1930 08/13/19 0357  WBC 14.8*  --   --   --   --   --  17.9*  NEUTROABS 11.5*  --   --   --   --   --   --   HGB 6.4*   < > 7.5* 9.2* 16.0 8.1* 8.3*  HCT 23.0*   < > 22.0* 27.0* 47.0 27.9* 30.1*  MCV 88.8  --   --   --   --   --  93.8  PLT 520*  --   --   --   --   --  397   < > = values in this interval not displayed.    Basic Metabolic Panel: Recent Labs  Lab 07/23/2019 0141 08/05/2019 0219 08/08/2019 0525 07/21/2019 1424 07/24/2019 1610 08/13/19 0357  NA 139 136 136 135  --  138  K 5.8* 5.4* 5.5* 5.2*  --  6.3*  CL 95*  --   --   --   --  99  CO2 15*  --   --   --   --  14*  GLUCOSE 142*  --   --   --   --  177*  BUN 121*  --   --   --   --  66*  CREATININE 16.09*  --   --   --   --  8.08*  CALCIUM 7.7*  --   --   --   --  8.1*  MG 2.3  --   --   --   --  2.5*  PHOS  --   --   --   --  8.3* 8.8*   GFR: Estimated Creatinine Clearance: 11.3 mL/min (A) (by C-G formula based on SCr of 8.08 mg/dL (H)). Recent Labs  Lab 08/06/2019 0141 08/11/2019 1610 08/13/19 0357  PROCALCITON  --  10.03  --   WBC 14.8*  --  17.9*  LATICACIDVEN  --   --  3.5*    Liver Function Tests: Recent Labs  Lab 07/26/2019 0141 08/13/19 0357  AST 50*  --   ALT 19  --   ALKPHOS 91  --   BILITOT 0.5  --   PROT 7.7  --   ALBUMIN 2.2* 1.8*   No results for input(s): LIPASE, AMYLASE in the last 168 hours. No results for input(s): AMMONIA in the last 168 hours.  ABG    Component Value Date/Time   PHART 7.336 (L) 07/29/2019 1424   PCO2ART 29.0 (L) 08/02/2019 1424   PO2ART 353.0 (H) 08/15/2019 1424   HCO3 15.6 (L) 08/08/2019 1424   TCO2 16 (L) 08/06/2019 1424   ACIDBASEDEF 9.0 (H) 08/11/2019 1424   O2SAT 100.0 07/17/2019 1424     Coagulation Profile: Recent Labs  Lab 08/01/2019 1610  INR 1.4*    Cardiac Enzymes: No results for input(s): CKTOTAL, CKMB, CKMBINDEX, TROPONINI  in the last 168 hours.  HbA1C: Hgb A1c MFr Bld  Date/Time Value Ref Range Status  08/09/2019 07:30 PM 5.6 4.8 - 5.6 % Final    Comment:    (NOTE) Pre diabetes:          5.7%-6.4% Diabetes:              >6.4% Glycemic control for   <7.0% adults with diabetes     CBG: Recent Labs  Lab 07/20/2019 1238 08/07/2019 1614 08/14/2019 1951 08/01/2019 2347 08/13/19 0322  GLUCAP 133* 148* 143* 119* 163*    Review of Systems:   Unable  Allergies Allergies  Allergen Reactions  . Amlodipine Itching  . Dacarbazine And Related Nausea And Vomiting  . Dilaudid [Hydromorphone] Itching  . Fentanyl Itching  . Hydrocodone Nausea And Vomiting  . Propofol Itching     Home Medications  Prior to Admission medications   Not on File   Maudie Mercury, MD IMTS, PGY-1 Pager: [phone]

## 2019-08-14 DIAGNOSIS — A419 Sepsis, unspecified organism: Secondary | ICD-10-CM

## 2019-08-14 DIAGNOSIS — R6521 Severe sepsis with septic shock: Secondary | ICD-10-CM

## 2019-08-14 DIAGNOSIS — J9601 Acute respiratory failure with hypoxia: Secondary | ICD-10-CM

## 2019-08-14 LAB — GLUCOSE, CAPILLARY
Glucose-Capillary: 249 mg/dL — ABNORMAL HIGH (ref 70–99)
Glucose-Capillary: 262 mg/dL — ABNORMAL HIGH (ref 70–99)
Glucose-Capillary: 270 mg/dL — ABNORMAL HIGH (ref 70–99)
Glucose-Capillary: 278 mg/dL — ABNORMAL HIGH (ref 70–99)
Glucose-Capillary: 279 mg/dL — ABNORMAL HIGH (ref 70–99)
Glucose-Capillary: 290 mg/dL — ABNORMAL HIGH (ref 70–99)
Glucose-Capillary: 317 mg/dL — ABNORMAL HIGH (ref 70–99)

## 2019-08-14 LAB — RENAL FUNCTION PANEL
Albumin: 2.1 g/dL — ABNORMAL LOW (ref 3.5–5.0)
Albumin: 1.9 g/dL — ABNORMAL LOW (ref 3.5–5.0)
Anion gap: 19 — ABNORMAL HIGH (ref 5–15)
Anion gap: 16 — ABNORMAL HIGH (ref 5–15)
BUN: 48 mg/dL — ABNORMAL HIGH (ref 6–20)
BUN: 41 mg/dL — ABNORMAL HIGH (ref 6–20)
CO2: 20 mmol/L — ABNORMAL LOW (ref 22–32)
CO2: 22 mmol/L (ref 22–32)
Calcium: 8.3 mg/dL — ABNORMAL LOW (ref 8.9–10.3)
Calcium: 8.1 mg/dL — ABNORMAL LOW (ref 8.9–10.3)
Chloride: 99 mmol/L (ref 98–111)
Chloride: 101 mmol/L (ref 98–111)
Creatinine, Ser: 4.43 mg/dL — ABNORMAL HIGH (ref 0.61–1.24)
Creatinine, Ser: 3.77 mg/dL — ABNORMAL HIGH (ref 0.61–1.24)
GFR calc Af Amer: 16 mL/min — ABNORMAL LOW (ref >60)
GFR calc Af Amer: 20 mL/min — ABNORMAL LOW (ref >60)
GFR calc non Af Amer: 14 mL/min — ABNORMAL LOW (ref >60)
GFR calc non Af Amer: 17 mL/min — ABNORMAL LOW (ref >60)
Glucose, Bld: 301 mg/dL — ABNORMAL HIGH (ref 70–99)
Glucose, Bld: 335 mg/dL — ABNORMAL HIGH (ref 70–99)
Phosphorus: 5.9 mg/dL — ABNORMAL HIGH (ref 2.5–4.6)
Phosphorus: 4.8 mg/dL — ABNORMAL HIGH (ref 2.5–4.6)
Potassium: 5 mmol/L (ref 3.5–5.1)
Potassium: 4.3 mmol/L (ref 3.5–5.1)
Sodium: 138 mmol/L (ref 135–145)
Sodium: 139 mmol/L (ref 135–145)

## 2019-08-14 LAB — POCT ACTIVATED CLOTTING TIME
Activated Clotting Time: 147 seconds
Activated Clotting Time: 147 seconds

## 2019-08-14 LAB — APTT: aPTT: 44 seconds — ABNORMAL HIGH (ref 24–36)

## 2019-08-14 LAB — MAGNESIUM: Magnesium: 2.5 mg/dL — ABNORMAL HIGH (ref 1.7–2.4)

## 2019-08-14 MED ORDER — MUPIROCIN 2 % EX OINT
1.0000 "application " | TOPICAL_OINTMENT | Freq: Two times a day (BID) | CUTANEOUS | Status: AC
  Administered 2019-08-14 – 2019-08-18 (×9): 1 via NASAL
  Filled 2019-08-14 (×2): qty 22

## 2019-08-14 MED ORDER — INSULIN DETEMIR 100 UNIT/ML ~~LOC~~ SOLN
10.0000 [IU] | Freq: Two times a day (BID) | SUBCUTANEOUS | Status: DC
  Administered 2019-08-14 (×2): 10 [IU] via SUBCUTANEOUS
  Filled 2019-08-14 (×4): qty 0.1

## 2019-08-14 MED ORDER — CHLORHEXIDINE GLUCONATE CLOTH 2 % EX PADS
6.0000 | MEDICATED_PAD | Freq: Every day | CUTANEOUS | Status: AC
  Administered 2019-08-16 – 2019-08-18 (×2): 6 via TOPICAL

## 2019-08-14 MED ORDER — INSULIN DETEMIR 100 UNIT/ML ~~LOC~~ SOLN: 10.0000 [IU] | Freq: Every day | SUBCUTANEOUS | Status: DC

## 2019-08-14 MED ORDER — INSULIN ASPART 100 UNIT/ML ~~LOC~~ SOLN
0.0000 [IU] | SUBCUTANEOUS | Status: DC
  Administered 2019-08-14: 11 [IU] via SUBCUTANEOUS
  Administered 2019-08-14 (×2): 8 [IU] via SUBCUTANEOUS
  Administered 2019-08-14: 5 [IU] via SUBCUTANEOUS
  Administered 2019-08-14 – 2019-08-15 (×4): 8 [IU] via SUBCUTANEOUS
  Administered 2019-08-15: 3 [IU] via SUBCUTANEOUS
  Administered 2019-08-15: 8 [IU] via SUBCUTANEOUS
  Administered 2019-08-15 (×3): 5 [IU] via SUBCUTANEOUS
  Administered 2019-08-16: 05:00:00 3 [IU] via SUBCUTANEOUS
  Administered 2019-08-16: 12:00:00 5 [IU] via SUBCUTANEOUS
  Administered 2019-08-16: 3 [IU] via SUBCUTANEOUS
  Administered 2019-08-16: 5 [IU] via SUBCUTANEOUS
  Administered 2019-08-16: 20:00:00 3 [IU] via SUBCUTANEOUS
  Administered 2019-08-17: 5 [IU] via SUBCUTANEOUS
  Administered 2019-08-17 (×2): 3 [IU] via SUBCUTANEOUS
  Administered 2019-08-17: 5 [IU] via SUBCUTANEOUS
  Administered 2019-08-17: 12:00:00 3 [IU] via SUBCUTANEOUS
  Administered 2019-08-18: 5 [IU] via SUBCUTANEOUS
  Administered 2019-08-18: 15 [IU] via SUBCUTANEOUS

## 2019-08-14 NOTE — Progress Notes (Addendum)
NAME:  [name], MRN:  [id number], DOB:  03/01/1966, LOS: 2 ADMISSION DATE:  08/06/2019, CONSULTATION DATE:  07/23/2019 REFERRING MD:  Dr. Roxanne Mins, CHIEF COMPLAINT:  Cardiac Arrest  Brief History   70 yoM ESRD on iHD (unclear last iHD) found unresponsive then witnessed cardiac arrest with ROSC after 3 mins. Required intubation for ongoing airway protection, ongoing unresponsiveness, and hypotensive requiring vasopressor support. In ER, patient did start to wake up, localize, and reaching for ETT. Past Medical History   . Amputee, below knee, left ([place])   . Anemia   . Blood transfusion   . Bronchitis   . Chest pain   . CHF (congestive heart failure) ([place])   . Chronic kidney disease    dialysis, [facility]-F, [facility]  . Diabetes mellitus    type 2  . Dysrhythmia    irregular heartbeat  . ESRD (end stage renal disease) on dialysis ([place])    first HD 06/19/04  . GERD (gastroesophageal reflux disease)   . Hyperparathyroidism ([place])   . Morbid obesity ([place])     Prior debridement on 07/21/19 for large necrotic ulceration on the anterior and lateral aspect of the left thigh.  Hx calciphylaxis.  Significant Hospital Events   1/28 Admitted. On levophed and added vasopressin  1/29: O/N Hyperkalemic, CRRT for correction also GOC discussion with family, made DNR. Still on levophed and multiple analgesics/sedatives.    Consults:   Procedures:  1/28 ETT >> 1/28 L Femoral>> 1/28 L external Jugular>> 1/28 R upper arm peripheral>> Significant Diagnostic Tests:  1/28 CTH >> 1. No CT evidence of acute intracranial process. 2. Chronic microvascular ischemic disease and parenchymal volume loss. 3. Diffuse soft tissue edema.  Correlate with hydration status. 4. Extensive intracranial atherosclerosis. 5. Secretions in the ethmoids and posterior nasopharynx are likely to intubation. 6. Mildly proptotic configuration of the globes which is symmetric Bilaterally. Micro Data:  1/28  SARS 2/ Flu A/B >>NGTD 1/28 BC x 2>>NGTD 1/28 MRSA PCR>> Positive.  1/28: Respiratory Culture (Trachial aspirate): MRSA  Antimicrobials:  1/28 vanc >> 1/28 zosyn >>  Interim history/subjective:  Weaning norepinephrine, currently at 15 Remains on vasopressin Insulin sliding scale changed to moderate overnight, remains hyperglycemic in the 200s CVVH running, removing 50 cc/h as he can tolerate He denies any pain currently on fentanyl, also on Versed Note respiratory culture data, MRSA   Objective   Blood pressure 131/61, pulse 91, temperature 97.7 F (36.5 C), temperature source Oral, resp. rate 17, height 5\' 9"  (1.753 m), weight 93.8 kg, SpO2 100 %.    Vent Mode: PRVC FiO2 (%):  [40 %] 40 % Set Rate:  [16 bmp] 16 bmp Vt Set:  [560 mL] 560 mL PEEP:  [5 cmH20] 5 cmH20 Pressure Support:  [8 cmH20] 8 cmH20 Plateau Pressure:  [16 cmH20-20 cmH20] 16 cmH20   Intake/Output Summary (Last 24 hours) at 08/14/2019 1045 Last data filed at 08/14/2019 1017 Gross per 24 hour  Intake 3183.4 ml  Output 3308 ml  Net -124.6 ml   Filed Weights   07/31/2019 0628 08/14/19 0500 08/14/19 0900  Weight: 87.9 kg 97 kg 93.8 kg  Examination: General: Acute and chronically ill-appearing man, ventilated HEENT: ET tube in good position, normocephalic, no significant JVD Neuro: Calm, wakes to voice, nods to questions, moves extremities CV: Regular, distant, no murmur PULM: Coarse bilateral breath sounds, no wheezing, no crackles GI: Nondistended, positive bowel sounds Extremities: Left BKA with calciphylaxis changes, scarred ulcerations up his leg and into his  groin.  I do not see any open wounds Skin: warm, dry, with multiple ulcerations and calciphylaxis.  Resolved Hospital Problem list    Assessment & Plan:   Cardiac Arrest in Setting of HFrEF: Prior TTE 05/2019 with LVEF 25-30%, LV global hypokinesis, mild LV hypertrophy, mildly reduced RV function, Indeterminate diastolic filling due to E-A  fusion. Hypothermia protocol was deferred given intact mental status postarrest Following telemetry for any ectopy, replete electrolytes as indicated Frequent neuro evaluations  Prolonged QTc: (505) Continue telemetry monitoring Avoid QTC prolonging medications Replete electrolytes as indicated  Multifactorial Shock, likely cardiogenic with superimposed septic shock.  Suspected source MRSA pneumonia.  No obvious skin lesions from his calciphylaxis:  Vancomycin as ordered Continue zosyn for now pending final blood culture results.  May be able to narrow Wean norepinephrine as able, goal MAP 65 Continue vasopressin for now Continue stress dose steroids  Acute Hypoxic Respiratory Failure:  Patient with pulmonary edema and history of HFrEF Continue mechanical ventilation, PRVC 8 cc/kg Volume removal as he can tolerate with CVVHD, hemodynamics will be limiting Daily SBT Follow chest x-ray, ABG  Acute Encephalopathy:  Patient is s/p cardiac arrest with seizure like activity treated with midazolam.  Now resolved. Frequent neuro checks, follow mental status.  Significantly improved. Begin to wean sedating medications as he can tolerate.  Pain is an issue due to his calciphylaxis as below  Calciphylaxis:  Patient with ESRD and PMH of calciphylaxis.  Better pain control currently Attempt to wean fentanyl, Versed Continue gabapentin  ESRD:  Appreciate nephrology management Continue CVVH, goal volume removal as he can tolerate hemodynamically  Anemia of Chronic Disease:  Transfuse Hgb <7 with PRBC.   Diabetes Mellitis Type 2:  Sliding-scale insulin moderate scale Add Levemir 10 units bid  Best practice:  Diet: NPO Pain/Anxiety/Delirium protocol (if indicated): Fentanyl/ prn versed VAP protocol (if indicated): yes DVT prophylaxis: heparin SQ GI prophylaxis: PPI Glucose control: SSI Mobility: BR Code Status: DNR Disposition: ICU Family: I discussed his status with his wife by  phone 1/30.   Labs   CBC: Recent Labs  Lab 08/03/2019 0141 08/15/2019 0141 08/08/2019 0219 07/30/2019 0525 08/05/2019 1424 07/28/2019 1930 08/13/19 0357  WBC 14.8*  --   --   --   --   --  17.9*  NEUTROABS 11.5*  --   --   --   --   --   --   HGB 6.4*   < > 7.5* 9.2* 16.0 8.1* 8.3*  HCT 23.0*   < > 22.0* 27.0* 47.0 27.9* 30.1*  MCV 88.8  --   --   --   --   --  93.8  PLT 520*  --   --   --   --   --  397   < > = values in this interval not displayed.    Basic Metabolic Panel: Recent Labs  Lab 08/09/2019 0141 07/22/2019 0219 08/03/2019 0525 07/27/2019 0525 07/21/2019 1424 07/22/2019 1424 07/23/2019 1610 08/13/19 0357 08/13/19 0908 08/13/19 1259 08/13/19 1500 08/14/19 0238  NA 139   < > 136  --  135  --   --  138  --   --  144 138  K 5.8*   < > 5.5*   < > 5.2*   < >  --  6.3* 6.2* 5.2* 4.6 5.0  CL 95*  --   --   --   --   --   --  99  --   --  110 99  CO2 15*  --   --   --   --   --   --  14*  --   --  14* 20*  GLUCOSE 142*  --   --   --   --   --   --  177*  --   --  227* 301*  BUN 121*  --   --   --   --   --   --  66*  --   --  48* 48*  CREATININE 16.09*  --   --   --   --   --   --  8.08*  --   --  5.05* 4.43*  CALCIUM 7.7*  --   --   --   --   --   --  8.1*  --   --  6.7* 8.3*  MG 2.3  --   --   --   --   --   --  2.5*  --   --   --  2.5*  PHOS  --   --   --   --   --   --  8.3* 8.8*  --   --  5.9* 5.9*   < > = values in this interval not displayed.   GFR: Estimated Creatinine Clearance: 21.3 mL/min (A) (by C-G formula based on SCr of 4.43 mg/dL (H)). Recent Labs  Lab 08/01/2019 0141 07/31/2019 1610 08/13/19 0357 08/13/19 1439  PROCALCITON  --  10.03  --   --   WBC 14.8*  --  17.9*  --   LATICACIDVEN  --   --  3.5* 1.8    Liver Function Tests: Recent Labs  Lab 08/13/2019 0141 08/13/19 0357 08/13/19 1500 08/14/19 0238  AST 50*  --   --   --   ALT 19  --   --   --   ALKPHOS 91  --   --   --   BILITOT 0.5  --   --   --   PROT 7.7  --   --   --   ALBUMIN 2.2* 1.8* 1.6* 2.1*     No results for input(s): LIPASE, AMYLASE in the last 168 hours. No results for input(s): AMMONIA in the last 168 hours.  ABG    Component Value Date/Time   PHART 7.336 (L) 08/06/2019 1424   PCO2ART 29.0 (L) 08/01/2019 1424   PO2ART 353.0 (H) 07/19/2019 1424   HCO3 15.6 (L) 07/18/2019 1424   TCO2 16 (L) 08/04/2019 1424   ACIDBASEDEF 9.0 (H) 08/02/2019 1424   O2SAT 100.0 07/25/2019 1424     Coagulation Profile: Recent Labs  Lab 07/16/2019 1610  INR 1.4*    Cardiac Enzymes: No results for input(s): CKTOTAL, CKMB, CKMBINDEX, TROPONINI in the last 168 hours.  HbA1C: Hgb A1c MFr Bld  Date/Time Value Ref Range Status  08/09/2019 07:30 PM 5.6 4.8 - 5.6 % Final    Comment:    (NOTE) Pre diabetes:          5.7%-6.4% Diabetes:              >6.4% Glycemic control for   <7.0% adults with diabetes     CBG: Recent Labs  Lab 08/13/19 1516 08/13/19 1949 08/14/19 0000 08/14/19 0411 08/14/19 0756  GLUCAP 242* 257* 270* 290* 262*    Review of Systems:   Unable  Allergies Allergies  Allergen Reactions  . Amlodipine Itching  . Dacarbazine  And Related Nausea And Vomiting  . Dilaudid [Hydromorphone] Itching  . Fentanyl Itching  . Hydrocodone Nausea And Vomiting  . Propofol Itching      Independent critical care time 34 minutes  Baltazar Apo, MD, PhD 08/14/2019, 11:04 AM [facility] [phone] or if no answer [phone]

## 2019-08-14 NOTE — Progress Notes (Signed)
eLink Physician-Brief Progress Note Patient Name: [name] DOB: Feb 28, 1966 MRN: [id number]   Date of Service  08/14/2019  HPI/Events of Note  Notified of glucose 200s on low dose SSI  eICU Interventions  Increase SSI to moderate dose     Intervention Category Major Interventions: Hyperglycemia - active titration of insulin therapy  Judd Lien 08/14/2019, 12:42 AM

## 2019-08-14 NOTE — Progress Notes (Signed)
eLink Physician-Brief Progress Note Patient Name: [name] DOB: April 15, 1966 MRN: [id number]   Date of Service  08/14/2019  HPI/Events of Note  Pt with frequent loose stools.   eICU Interventions  Flexiseal ordered.     Intervention Category Minor Interventions: Other:  Elsie Lincoln 08/14/2019, 9:12 PM

## 2019-08-14 NOTE — Progress Notes (Signed)
[facility] Progress Note  Subjective: I/O about even yest, still on 2 pressors. Labs okay.   Vitals:   08/14/19 0759 08/14/19 0800 08/14/19 0900 08/14/19 1000  BP:  (!) 119/55 135/60 131/61  Pulse:      Resp:  '19 16 17  ' Temp: 97.7 F (36.5 C)     TempSrc: Oral     SpO2:  91% 100% 100%  Weight:   93.8 kg   Height:        Exam: General: AAM on vent, sedated HEENT: ET tube  Eyes: Eyes closed Cor: RRR no RG Lungs: Coarse mechanical breath sounds on vent Abdomen: Soft obese habitus reduced bowel sounds Extremities: 1+ edema right lower extremity with left below the knee amputation Skin: large necrotic wound left lateral upper leg scooped out w/ sig tissue loss; distal L BKA stump tissue is necrotic as well and there is penile gangrene also Neuro: Sedated on vent Access right femoral TDC in place  [facility]   R fem TDC   450/800  2/2 bath  86kg   -mircera 200 mcg every 2 weeks last on 1/16 -Sodium thiosulfate 25 gram every treatment  -Weekly venofer -parsabiv 2.5 mg three times a week    CXR 1/28 - no active disease  Assessment/Plan:  # Cardiac arrest - Supportive care per pulm critical care.  They do not plan for hypothermia protocol  # Acute hypoxic respiratory failure - Vent per pulmonology/critcial care  # End-stage renal disease - Normally reported as MWF schedule  - Started CRRT on 1/28 - keeping even for now, 1-2kg up from dry wt  - for hyperkalemia change to 0K dialysate for now then 2K when better - pt very severely ill and unlikely to recover acutely and long-term, support efforts to limit interventions from primary team  # Shock of unclear etiology  - remains on levo/ vaso gtt's  # Anemia acute on chronic - Status post PRBCs - last mircera was on 1/16 at 200 mcg - Due 1/30  # Acute encephalopathy  - s/p arrest  - also with report of seizure activity and s/p midazolam  - Per critical care   # Calciphylaxis - cont tiw  sodium thiosulfate  # Secondary hyperparathyroidism  - No activated vit D with calciphylaxis - Trend calcium post arrest and reinitiate parsabiv vs sensipar as available here    Kelly Splinter 08/14/2019, 10:11 AM  Inpatient medications: . B-complex with vitamin C  1 tablet Per Tube Daily  . chlorhexidine gluconate (MEDLINE KIT)  15 mL Mouth Rinse BID  . Chlorhexidine Gluconate Cloth  6 each Topical Daily  . Chlorhexidine Gluconate Cloth  6 each Topical Q0600  . darbepoetin (ARANESP) injection - DIALYSIS  150 mcg Intravenous Q Sat-HD  . feeding supplement (PRO-STAT SUGAR FREE 64)  60 mL Per Tube TID  . fentaNYL (SUBLIMAZE) injection  25 mcg Intravenous Once  . gabapentin  200 mg Per Tube Q12H  . hydrocortisone sod succinate (SOLU-CORTEF) inj  50 mg Intravenous Q6H  . insulin aspart  0-15 Units Subcutaneous Q4H  . mouth rinse  15 mL Mouth Rinse 10 times per day  . mupirocin ointment  1 application Nasal BID  . pantoprazole (PROTONIX) IV  40 mg Intravenous QHS  . sodium chloride flush  10-40 mL Intracatheter Q12H  . vecuronium  10 mg Intravenous Once   .  prismasol BGK 4/2.5 400 mL/hr at 08/14/19 0222  .  prismasol BGK 4/2.5 200 mL/hr at 08/13/19  9824  . feeding supplement (VITAL 1.5 CAL) 35 mL/hr at 08/13/19 2000  . fentaNYL infusion INTRAVENOUS 300 mcg/hr (08/14/19 1000)  . heparin 10,000 units/ 20 mL infusion syringe 2,050 Units/hr (08/14/19 0700)  . midazolam 0.5 mg/hr (08/14/19 1000)  . norepinephrine (LEVOPHED) Adult infusion 22 mcg/min (08/14/19 1000)  . piperacillin-tazobactam Stopped (08/14/19 0554)  . prismasol BGK 2/2.5 dialysis solution 1,800 mL/hr at 08/14/19 0801  . sodium thiosulfate infusion for calciphylaxis Stopped (08/13/19 1407)  . vancomycin Stopped (08/13/19 1239)  . vasopressin (PITRESSIN) infusion - *FOR SHOCK* 0.03 Units/min (08/14/19 1000)   albuterol, bisacodyl, fentaNYL, heparin, heparin, hydrOXYzine, influenza vac split quadrivalent PF, LORazepam,  midazolam, midazolam, pneumococcal 23 valent vaccine, sodium chloride flush Recent Labs  Lab 08/13/19 1500 08/14/19 0238  NA 144 138  K 4.6 5.0  CL 110 99  CO2 14* 20*  GLUCOSE 227* 301*  BUN 48* 48*  CREATININE 5.05* 4.43*  CALCIUM 6.7* 8.3*  PHOS 5.9* 5.9*

## 2019-08-15 LAB — RENAL FUNCTION PANEL
Albumin: 1.9 g/dL — ABNORMAL LOW (ref 3.5–5.0)
Albumin: 2 g/dL — ABNORMAL LOW (ref 3.5–5.0)
Anion gap: 13 (ref 5–15)
Anion gap: 16 — ABNORMAL HIGH (ref 5–15)
BUN: 38 mg/dL — ABNORMAL HIGH (ref 6–20)
BUN: 39 mg/dL — ABNORMAL HIGH (ref 6–20)
CO2: 24 mmol/L (ref 22–32)
CO2: 19 mmol/L — ABNORMAL LOW (ref 22–32)
Calcium: 8.4 mg/dL — ABNORMAL LOW (ref 8.9–10.3)
Calcium: 8.2 mg/dL — ABNORMAL LOW (ref 8.9–10.3)
Chloride: 102 mmol/L (ref 98–111)
Chloride: 102 mmol/L (ref 98–111)
Creatinine, Ser: 2.96 mg/dL — ABNORMAL HIGH (ref 0.61–1.24)
Creatinine, Ser: 3.01 mg/dL — ABNORMAL HIGH (ref 0.61–1.24)
GFR calc Af Amer: 26 mL/min — ABNORMAL LOW (ref >60)
GFR calc Af Amer: 26 mL/min — ABNORMAL LOW (ref >60)
GFR calc non Af Amer: 23 mL/min — ABNORMAL LOW (ref >60)
GFR calc non Af Amer: 22 mL/min — ABNORMAL LOW (ref >60)
Glucose, Bld: 259 mg/dL — ABNORMAL HIGH (ref 70–99)
Glucose, Bld: 262 mg/dL — ABNORMAL HIGH (ref 70–99)
Phosphorus: 3.5 mg/dL (ref 2.5–4.6)
Phosphorus: 3.6 mg/dL (ref 2.5–4.6)
Potassium: 4 mmol/L (ref 3.5–5.1)
Potassium: 4.2 mmol/L (ref 3.5–5.1)
Sodium: 139 mmol/L (ref 135–145)
Sodium: 137 mmol/L (ref 135–145)

## 2019-08-15 LAB — VANCOMYCIN, TROUGH: Vancomycin Tr: 17 ug/mL (ref 15–20)

## 2019-08-15 LAB — CULTURE, RESPIRATORY W GRAM STAIN

## 2019-08-15 LAB — CBC
HCT: 27 % — ABNORMAL LOW (ref 39.0–52.0)
Hemoglobin: 7.6 g/dL — ABNORMAL LOW (ref 13.0–17.0)
MCH: 25.9 pg — ABNORMAL LOW (ref 26.0–34.0)
MCHC: 28.1 g/dL — ABNORMAL LOW (ref 30.0–36.0)
MCV: 91.8 fL (ref 80.0–100.0)
Platelets: 194 10*3/uL (ref 150–400)
RBC: 2.94 MIL/uL — ABNORMAL LOW (ref 4.22–5.81)
RDW: 19.3 % — ABNORMAL HIGH (ref 11.5–15.5)
WBC: 15.2 10*3/uL — ABNORMAL HIGH (ref 4.0–10.5)
nRBC: 0.6 % — ABNORMAL HIGH (ref 0.0–0.2)

## 2019-08-15 LAB — POCT ACTIVATED CLOTTING TIME
Activated Clotting Time: 142 seconds
Activated Clotting Time: 175 seconds
Activated Clotting Time: 175 seconds
Activated Clotting Time: 186 seconds
Activated Clotting Time: 191 seconds
Activated Clotting Time: 191 seconds
Activated Clotting Time: 197 seconds

## 2019-08-15 LAB — GLUCOSE, CAPILLARY
Glucose-Capillary: 218 mg/dL — ABNORMAL HIGH (ref 70–99)
Glucose-Capillary: 207 mg/dL — ABNORMAL HIGH (ref 70–99)
Glucose-Capillary: 264 mg/dL — ABNORMAL HIGH (ref 70–99)
Glucose-Capillary: 223 mg/dL — ABNORMAL HIGH (ref 70–99)
Glucose-Capillary: 190 mg/dL — ABNORMAL HIGH (ref 70–99)
Glucose-Capillary: 278 mg/dL — ABNORMAL HIGH (ref 70–99)

## 2019-08-15 LAB — MAGNESIUM: Magnesium: 2.4 mg/dL (ref 1.7–2.4)

## 2019-08-15 LAB — APTT: aPTT: 34 seconds (ref 24–36)

## 2019-08-15 MED ORDER — ALTEPLASE 2 MG IJ SOLR
2.0000 mg | Freq: Once | INTRAMUSCULAR | Status: AC
  Administered 2019-08-15: 2 mg
  Filled 2019-08-15: qty 2

## 2019-08-15 MED ORDER — INSULIN DETEMIR 100 UNIT/ML ~~LOC~~ SOLN
15.0000 [IU] | Freq: Two times a day (BID) | SUBCUTANEOUS | Status: DC
  Administered 2019-08-15 – 2019-08-17 (×4): 15 [IU] via SUBCUTANEOUS
  Filled 2019-08-15 (×7): qty 0.15

## 2019-08-15 NOTE — Progress Notes (Signed)
NAME:  [name], MRN:  [id number], DOB:  Feb 05, 1966, LOS: 3 ADMISSION DATE:  07/23/2019, CONSULTATION DATE:  08/08/2019 REFERRING MD:  Dr. Roxanne Mins, CHIEF COMPLAINT:  Cardiac Arrest  Brief History   57 yoM ESRD on iHD (unclear last iHD) found unresponsive then witnessed cardiac arrest with ROSC after 3 mins. Required intubation for ongoing airway protection, ongoing unresponsiveness, and hypotensive requiring vasopressor support. In ER, patient did start to wake up, localize, and reaching for ETT.  Past Medical History   . Amputee, below knee, left ([place])   . Anemia   . Blood transfusion   . Bronchitis   . Chest pain   . CHF (congestive heart failure) ([place])   . Chronic kidney disease    dialysis, [facility]-F, [facility]  . Diabetes mellitus    type 2  . Dysrhythmia    irregular heartbeat  . ESRD (end stage renal disease) on dialysis ([place])    first HD 06/19/04  . GERD (gastroesophageal reflux disease)   . Hyperparathyroidism ([place])   . Morbid obesity ([place])     Prior debridement on 07/21/19 for large necrotic ulceration on the anterior and lateral aspect of the left thigh.  Hx calciphylaxis.  Significant Hospital Events   1/28 Admitted. On levophed and added vasopressin  1/29: O/N Hyperkalemic, CRRT for correction also GOC discussion with family, made DNR. Still on levophed and multiple analgesics/sedatives.    Consults:   Procedures:  1/28 ETT >> 1/28 L Femoral>> 1/28 L external Jugular>> 1/28 R upper arm peripheral>>  Significant Diagnostic Tests:  1/28 CTH >> 1. No CT evidence of acute intracranial process. 2. Chronic microvascular ischemic disease and parenchymal volume loss. 3. Diffuse soft tissue edema.  Correlate with hydration status. 4. Extensive intracranial atherosclerosis. 5. Secretions in the ethmoids and posterior nasopharynx are likely to intubation. 6. Mildly proptotic configuration of the globes which is symmetric Bilaterally. Micro Data:    1/28 SARS 2/ Flu A/B >>NGTD 1/28 BC x 2>>NGTD 1/28 MRSA PCR>> Positive.  1/28: Respiratory Culture (Trachial aspirate): MRSA  Antimicrobials:  1/28 vanc >> 1/28 zosyn >> 1/31  Interim history/subjective:  Remains on norepinephrine, slightly increased 15 > 18 compared with yesterday Remains on vasopressin Flexi-Seal placed for loose stools overnight CVVH running, tolerated UF for most of the day 1/30 but unable so far this morning due to hemodynamics Awake and able to interact even on fentanyl 175, Versed 0.5   Objective   Blood pressure 92/60, pulse 87, temperature (!) 97.4 F (36.3 C), temperature source Oral, resp. rate 13, height 5\' 9"  (1.753 m), weight 93.8 kg, SpO2 100 %.    Vent Mode: PRVC FiO2 (%):  [40 %] 40 % Set Rate:  [16 bmp] 16 bmp Vt Set:  [560 mL] 560 mL PEEP:  [5 cmH20] 5 cmH20 Plateau Pressure:  [12 cmH20-21 cmH20] 12 cmH20   Intake/Output Summary (Last 24 hours) at 08/15/2019 1017 Last data filed at 08/15/2019 1000 Gross per 24 hour  Intake 2504.21 ml  Output 3444 ml  Net -939.79 ml   Filed Weights   08/14/19 0500 08/14/19 0900 08/15/19 0500  Weight: 97 kg 93.8 kg 93.8 kg  Examination: General: Acutely ill man, ventilated HEENT: Normocephalic, ET tube in good position, scant tan secretions Neuro: Awake even on sedation, nods to voice and follows commands.  He would do spontaneous breathing when prompted but became apneic when left alone CV: Regular, distant, no murmur PULM: Coarse bilateral breath sounds, no wheezing GI: Nondistended, positive bowel  sounds Extremities: Left BKA.  Chronic calciphylaxis changes on the left leg extending up into the groin but I do not see any open wounds or purulence Skin: warm, dry, with multiple ulcerations and calciphylaxis.   Resolved Hospital Problem list    Assessment & Plan:   Cardiac Arrest in Setting of HFrEF: Prior TTE 05/2019 with LVEF 25-30%, LV global hypokinesis, mild LV hypertrophy, mildly reduced RV  function, Indeterminate diastolic filling due to E-A fusion. Hypothermia protocol was deferred given his intact neurological status Following telemetry for any ectopy, replete electrolytes as indicated Frequent neurological evaluation  Prolonged QTc: (505) Continue telemetry monitoring Avoiding QTC prolonging medications Replete electrolytes as indicated Follow ECG  Multifactorial Shock, likely cardiogenic with superimposed septic shock.  Suspected source MRSA pneumonia.  No obvious skin lesions from his calciphylaxis:  Blood cultures from 1/28 remain negative, now on day 4 Zosyn.  Plan to stop today 1/31 and follow.  Continue vancomycin for MRSA pneumonia, plan 8-day course Wean norepinephrine as able Continue vasopressin for now Continue stress dose steroids Will likely need to defer volume removal given his pressor needs  Acute Hypoxic Respiratory Failure due to pulmonary edema and history of HFrEF, MRSA pneumonia, encephalopathy (improved) Mechanical ventilation, PRVC 8 cc/kg Should be able to initiate SBT's.  Decrease his sedation further to facilitate.  He has tolerated some PSV when stimulated, then becomes apneic. Volume removal when he can tolerate hemodynamically Follow chest x-ray  Acute Encephalopathy:  Patient is s/p cardiac arrest with seizure like activity treated with midazolam.  Now resolved. Frequent neuro checks, follow mental status.  Significantly improved. Begin to wean sedating medications as he can tolerate.  He does have significant pain from his calciphylaxis  Calciphylaxis:  Patient with ESRD and PMH of calciphylaxis.  Adequate pain control currently fairly high-dose fentanyl infusion.  Attempt to wean as able Continue gabapentin  ESRD:  Appreciate nephrology management Continue CVVHD, goal volume removal when hemodynamics will allow  Anemia of Chronic Disease:  Transfuse Hgb <7 with PRBC.   Diabetes Mellitis Type 2:  Sliding-scale insulin per  moderate scale Levemir added 1/30, increase on 1/31 to 15 units twice daily  Best practice:  Diet: NPO Pain/Anxiety/Delirium protocol (if indicated): Fentanyl/ prn versed VAP protocol (if indicated): yes DVT prophylaxis: heparin SQ GI prophylaxis: PPI Glucose control: SSI Mobility: BR Code Status: DNR Disposition: ICU Family: I discussed his status with his wife by phone 1/31.    Labs   CBC: Recent Labs  Lab 07/21/2019 0141 07/28/2019 0141 07/26/2019 0219 08/06/2019 0525 07/20/2019 1424 07/26/2019 1930 08/13/19 0357  WBC 14.8*  --   --   --   --   --  17.9*  NEUTROABS 11.5*  --   --   --   --   --   --   HGB 6.4*   < > 7.5* 9.2* 16.0 8.1* 8.3*  HCT 23.0*   < > 22.0* 27.0* 47.0 27.9* 30.1*  MCV 88.8  --   --   --   --   --  93.8  PLT 520*  --   --   --   --   --  397   < > = values in this interval not displayed.    Basic Metabolic Panel: Recent Labs  Lab 07/23/2019 0141 07/21/2019 0219 08/13/19 0357 08/13/19 0908 08/13/19 1259 08/13/19 1500 08/14/19 0238 08/14/19 1356 08/15/19 0516  NA 139   < > 138  --   --  144 138 139 139  K 5.8*   < > 6.3*   < > 5.2* 4.6 5.0 4.3 4.0  CL 95*   < > 99  --   --  110 99 101 102  CO2 15*   < > 14*  --   --  14* 20* 22 24  GLUCOSE 142*   < > 177*  --   --  227* 301* 335* 259*  BUN 121*   < > 66*  --   --  48* 48* 41* 38*  CREATININE 16.09*   < > 8.08*  --   --  5.05* 4.43* 3.77* 2.96*  CALCIUM 7.7*   < > 8.1*  --   --  6.7* 8.3* 8.1* 8.4*  MG 2.3  --  2.5*  --   --   --  2.5*  --  2.4  PHOS  --    < > 8.8*  --   --  5.9* 5.9* 4.8* 3.5   < > = values in this interval not displayed.   GFR: Estimated Creatinine Clearance: 31.9 mL/min (A) (by C-G formula based on SCr of 2.96 mg/dL (H)). Recent Labs  Lab 08/01/2019 0141 07/24/2019 1610 08/13/19 0357 08/13/19 1439  PROCALCITON  --  10.03  --   --   WBC 14.8*  --  17.9*  --   LATICACIDVEN  --   --  3.5* 1.8    Liver Function Tests: Recent Labs  Lab 07/19/2019 0141 07/24/2019 0141  08/13/19 0357 08/13/19 1500 08/14/19 0238 08/14/19 1356 08/15/19 0516  AST 50*  --   --   --   --   --   --   ALT 19  --   --   --   --   --   --   ALKPHOS 91  --   --   --   --   --   --   BILITOT 0.5  --   --   --   --   --   --   PROT 7.7  --   --   --   --   --   --   ALBUMIN 2.2*   < > 1.8* 1.6* 2.1* 1.9* 1.9*   < > = values in this interval not displayed.   No results for input(s): LIPASE, AMYLASE in the last 168 hours. No results for input(s): AMMONIA in the last 168 hours.  ABG    Component Value Date/Time   PHART 7.336 (L) 08/13/2019 1424   PCO2ART 29.0 (L) 08/09/2019 1424   PO2ART 353.0 (H) 07/16/2019 1424   HCO3 15.6 (L) 07/28/2019 1424   TCO2 16 (L) 07/19/2019 1424   ACIDBASEDEF 9.0 (H) 08/10/2019 1424   O2SAT 100.0 08/10/2019 1424     Coagulation Profile: Recent Labs  Lab 07/23/2019 1610  INR 1.4*    Cardiac Enzymes: No results for input(s): CKTOTAL, CKMB, CKMBINDEX, TROPONINI in the last 168 hours.  HbA1C: Hgb A1c MFr Bld  Date/Time Value Ref Range Status  08/11/2019 07:30 PM 5.6 4.8 - 5.6 % Final    Comment:    (NOTE) Pre diabetes:          5.7%-6.4% Diabetes:              >6.4% Glycemic control for   <7.0% adults with diabetes     CBG: Recent Labs  Lab 08/14/19 1603 08/14/19 1956 08/14/19 2329 08/15/19 0328 08/15/19 0741  GLUCAP 278* 279* 317* 218* 207*    Review  of Systems:   Unable  Allergies Allergies  Allergen Reactions  . Amlodipine Itching  . Dacarbazine And Related Nausea And Vomiting  . Dilaudid [Hydromorphone] Itching  . Fentanyl Itching  . Hydrocodone Nausea And Vomiting  . Propofol Itching      Independent critical care time 33 minutes  Baltazar Apo, MD, PhD 08/15/2019, 10:17 AM  [facility] [phone] or if no answer [phone]

## 2019-08-15 NOTE — Progress Notes (Signed)
[facility] Progress Note  Subjective: pt more responsive. - 1.2 L net yest, wt's stable, vaso and levo gtts , levo rising  Vitals:   08/15/19 1030 08/15/19 1045 08/15/19 1100 08/15/19 1109  BP: 114/68 119/72 (!) 83/51   Pulse:    84  Resp: 16 (!) '9 14 14  ' Temp:      TempSrc:      SpO2: 100% 100% 100% 100%  Weight:      Height:        Exam: General: AAM on vent, sedated, moving more today HEENT: ET tube  Eyes: Eyes closed Cor: RRR no RG Lungs: Coarse mechanical breath sounds on vent Abdomen: Soft obese habitus reduced bowel sounds Extremities: bilat 1-2+ LE edema Skin: large necrotic wound left lateral upper leg w/ sig tissue loss; distal L BKA stump tissue necrotic also penis gangrene Neuro: Sedated on vent Access right femoral TDC in place  [facility] M[facility]   R fem TDC   450/800  2/2 bath  86kg   -mircera 200 mcg every 2 weeks last on 1/16 -Sodium thiosulfate 25 gram every treatment  -Weekly venofer -parsabiv 2.5 mg three times a week    CXR 1/28 - no active disease  Assessment/Plan:  # Cardiac arrest/ found down - Supportive care per pulm critical care  # Acute hypoxic respiratory failure - Vent per pulmonology/critcial care  # End-stage renal disease - Normally reported as MWF schedule  - CRRT started 1/28 - cont to keep even - clotting more frequently , getting CRRT heparin, consider citrate if still having problems tomorrow  # Shock - likely septic, MRSA growing resp cx, bcx's negative on IV abx, pressors - per CCM  # Anemia acute on chronic - Status post PRBCs - last mircera was on 1/16 at 200 mcg - Due 1/30  # Acute encephalopathy - more responsive now - s/p arrest  - also with report of seizure activity and s/p midazolam  - Per critical care   # 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 B-complex with vitamin C  1 tablet Per Tube Daily  . chlorhexidine gluconate (MEDLINE KIT)  15 mL Mouth Rinse BID  . Chlorhexidine Gluconate Cloth  6 each Topical Daily  . Chlorhexidine Gluconate Cloth  6 each Topical Q0600  . darbepoetin (ARANESP) injection - DIALYSIS  150 mcg Intravenous Q Sat-HD  . feeding supplement (PRO-STAT SUGAR FREE 64)  60 mL Per Tube TID  . fentaNYL (SUBLIMAZE) injection  25 mcg Intravenous Once  . gabapentin  200 mg Per Tube Q12H  . hydrocortisone sod succinate (SOLU-CORTEF) inj  50 mg Intravenous Q6H  . insulin aspart  0-15 Units Subcutaneous Q4H  . insulin detemir  15 Units Subcutaneous BID  . mouth rinse  15 mL Mouth Rinse 10 times per day  . mupirocin ointment  1 application Nasal BID  . pantoprazole (PROTONIX) IV  40 mg Intravenous QHS  . sodium chloride flush  10-40 mL Intracatheter Q12H   .  prismasol BGK 4/2.5 400 mL/hr at 08/15/19 0332  .  prismasol BGK 4/2.5 200 mL/hr at 08/14/19 1354  . feeding supplement (VITAL 1.5 CAL) 1,000 mL (08/14/19 1600)  . fentaNYL infusion INTRAVENOUS 175  mcg/hr (08/15/19 1100)  . heparin 10,000 units/ 20 mL infusion syringe 2,050 Units/hr (08/15/19 0825)  . midazolam 0.5 mg/hr (08/15/19 1100)  . norepinephrine (LEVOPHED) Adult infusion 18 mcg/min (08/15/19 1100)  . prismasol BGK 2/2.5 dialysis solution 1,800 mL/hr at 08/15/19 0839  . sodium thiosulfate infusion for calciphylaxis Stopped (08/13/19 1407)  . vancomycin Stopped (08/14/19 1212)  . vasopressin (PITRESSIN) infusion - *FOR SHOCK* 0.03 Units/min (08/15/19 1100)   albuterol, bisacodyl, fentaNYL, heparin, heparin, hydrOXYzine, influenza vac split quadrivalent PF, LORazepam, midazolam, pneumococcal 23 valent vaccine, sodium chloride flush Recent Labs  Lab 08/14/19 1356 08/15/19 0516  NA 139 139  K  4.3 4.0  CL 101 102  CO2 22 24  GLUCOSE 335* 259*  BUN 41* 38*  CREATININE 3.77* 2.96*  CALCIUM 8.1* 8.4*  PHOS 4.8* 3.5

## 2019-08-15 NOTE — Progress Notes (Signed)
Pharmacy Antibiotic Note  [name] is a 54 y.o. male admitted on 07/26/2019 with sepsis.  Pharmacy has been consulted for Vancomycin/Zosyn dosing. S/P brief cardiac arrest. WBC elevated. ESRD on HD MWF, now started on CRRT this morning with ongoing hypotension.  Resp cx with MRSA this AM - zosyn stopped.  Vancomycin trough level 1/31  = 17 (at goal of 15-20)  Plan Continue Vancomycin 750 mg IV q 24 hrs while on CRRT Planning 8 days on vancomycin.   Temp (24hrs), Avg:97.9 F (36.6 C), Min:97.4 F (36.3 C), Max:98.6 F (37 C)  Recent Labs  Lab 07/30/2019 0141 07/24/2019 0141 08/13/19 0357 08/13/19 1439 08/13/19 1500 08/14/19 0238 08/14/19 1356 08/15/19 0516 08/15/19 1131  WBC 14.8*  --  17.9*  --   --   --   --   --   --   CREATININE 16.09*   < > 8.08*  --  5.05* 4.43* 3.77* 2.96*  --   LATICACIDVEN  --   --  3.5* 1.8  --   --   --   --   --   VANCOTROUGH  --   --   --   --   --   --   --   --  17   < > = values in this interval not displayed.    Estimated Creatinine Clearance: 31.9 mL/min (A) (by C-G formula based on SCr of 2.96 mg/dL (H)).    Allergies  Allergen Reactions  . Amlodipine Itching  . Dacarbazine And Related Nausea And Vomiting  . Dilaudid [Hydromorphone] Itching  . Fentanyl Itching  . Hydrocodone Nausea And Vomiting  . Propofol Itching   Antimicrobials this admission:  Vancomycin 1/28 >  (planning 8 days) Zosyn 1/28 > 1/31  Dose adjustments this admission:  1/31 VT (on CRRT) = 17- cont 750 q 24  Microbiology results:  1/28 MRSA + 1/28 BCx x 2: NGTD 1/28 Aspirate cx: moderate MRSA  1/28 Resp panel neg  Marguerite Olea, [facility] Clinical Pharmacist Phone [phone]  08/15/2019 12:55 PM

## 2019-08-16 ENCOUNTER — Inpatient Hospital Stay (HOSPITAL_COMMUNITY): Payer: Medicare Other

## 2019-08-16 ENCOUNTER — Encounter (HOSPITAL_COMMUNITY): Payer: Self-pay | Admitting: Critical Care Medicine

## 2019-08-16 LAB — POCT ACTIVATED CLOTTING TIME
Activated Clotting Time: 191 seconds
Activated Clotting Time: 191 seconds
Activated Clotting Time: 197 seconds
Activated Clotting Time: 202 seconds
Activated Clotting Time: 202 seconds
Activated Clotting Time: 169 seconds
Activated Clotting Time: 180 seconds
Activated Clotting Time: 186 seconds
Activated Clotting Time: 213 seconds
Activated Clotting Time: 213 seconds
Activated Clotting Time: 213 seconds
Activated Clotting Time: 219 seconds
Activated Clotting Time: 219 seconds
Activated Clotting Time: 224 seconds

## 2019-08-16 LAB — RENAL FUNCTION PANEL
Albumin: 1.9 g/dL — ABNORMAL LOW (ref 3.5–5.0)
Albumin: 1.8 g/dL — ABNORMAL LOW (ref 3.5–5.0)
Anion gap: 23 — ABNORMAL HIGH (ref 5–15)
Anion gap: 18 — ABNORMAL HIGH (ref 5–15)
BUN: 39 mg/dL — ABNORMAL HIGH (ref 6–20)
BUN: 32 mg/dL — ABNORMAL HIGH (ref 6–20)
CO2: 16 mmol/L — ABNORMAL LOW (ref 22–32)
CO2: 23 mmol/L (ref 22–32)
Calcium: 8.7 mg/dL — ABNORMAL LOW (ref 8.9–10.3)
Calcium: 8.2 mg/dL — ABNORMAL LOW (ref 8.9–10.3)
Chloride: 101 mmol/L (ref 98–111)
Chloride: 103 mmol/L (ref 98–111)
Creatinine, Ser: 2.73 mg/dL — ABNORMAL HIGH (ref 0.61–1.24)
Creatinine, Ser: 2.12 mg/dL — ABNORMAL HIGH (ref 0.61–1.24)
GFR calc Af Amer: 29 mL/min — ABNORMAL LOW (ref >60)
GFR calc Af Amer: 39 mL/min — ABNORMAL LOW (ref >60)
GFR calc non Af Amer: 25 mL/min — ABNORMAL LOW (ref >60)
GFR calc non Af Amer: 34 mL/min — ABNORMAL LOW (ref >60)
Glucose, Bld: 236 mg/dL — ABNORMAL HIGH (ref 70–99)
Glucose, Bld: 192 mg/dL — ABNORMAL HIGH (ref 70–99)
Phosphorus: 4.1 mg/dL (ref 2.5–4.6)
Phosphorus: 2 mg/dL — ABNORMAL LOW (ref 2.5–4.6)
Potassium: 4.4 mmol/L (ref 3.5–5.1)
Potassium: 3.1 mmol/L — ABNORMAL LOW (ref 3.5–5.1)
Sodium: 140 mmol/L (ref 135–145)
Sodium: 144 mmol/L (ref 135–145)

## 2019-08-16 LAB — POCT I-STAT 7, (LYTES, BLD GAS, ICA,H+H)
Acid-base deficit: 2 mmol/L (ref 0.0–2.0)
Bicarbonate: 21.9 mmol/L (ref 20.0–28.0)
Calcium, Ion: 1.17 mmol/L (ref 1.15–1.40)
HCT: 23 % — ABNORMAL LOW (ref 39.0–52.0)
Hemoglobin: 7.8 g/dL — ABNORMAL LOW (ref 13.0–17.0)
O2 Saturation: 100 %
Potassium: 2.9 mmol/L — ABNORMAL LOW (ref 3.5–5.1)
Sodium: 140 mmol/L (ref 135–145)
TCO2: 23 mmol/L (ref 22–32)
pCO2 arterial: 33.7 mmHg (ref 32.0–48.0)
pH, Arterial: 7.422 (ref 7.350–7.450)
pO2, Arterial: 177 mmHg — ABNORMAL HIGH (ref 83.0–108.0)

## 2019-08-16 LAB — CBC
HCT: 27.1 % — ABNORMAL LOW (ref 39.0–52.0)
Hemoglobin: 7.1 g/dL — ABNORMAL LOW (ref 13.0–17.0)
MCH: 25.7 pg — ABNORMAL LOW (ref 26.0–34.0)
MCHC: 26.2 g/dL — ABNORMAL LOW (ref 30.0–36.0)
MCV: 98.2 fL (ref 80.0–100.0)
Platelets: 166 10*3/uL (ref 150–400)
RBC: 2.76 MIL/uL — ABNORMAL LOW (ref 4.22–5.81)
RDW: 19.9 % — ABNORMAL HIGH (ref 11.5–15.5)
WBC: 14.4 10*3/uL — ABNORMAL HIGH (ref 4.0–10.5)
nRBC: 1 % — ABNORMAL HIGH (ref 0.0–0.2)

## 2019-08-16 LAB — GLUCOSE, CAPILLARY
Glucose-Capillary: 165 mg/dL — ABNORMAL HIGH (ref 70–99)
Glucose-Capillary: 172 mg/dL — ABNORMAL HIGH (ref 70–99)
Glucose-Capillary: 180 mg/dL — ABNORMAL HIGH (ref 70–99)
Glucose-Capillary: 183 mg/dL — ABNORMAL HIGH (ref 70–99)
Glucose-Capillary: 211 mg/dL — ABNORMAL HIGH (ref 70–99)
Glucose-Capillary: 239 mg/dL — ABNORMAL HIGH (ref 70–99)
Glucose-Capillary: 242 mg/dL — ABNORMAL HIGH (ref 70–99)
Glucose-Capillary: 249 mg/dL — ABNORMAL HIGH (ref 70–99)
Glucose-Capillary: 72 mg/dL (ref 70–99)

## 2019-08-16 LAB — APTT: aPTT: 180 seconds (ref 24–36)

## 2019-08-16 LAB — MAGNESIUM: Magnesium: 2.9 mg/dL — ABNORMAL HIGH (ref 1.7–2.4)

## 2019-08-16 MED ORDER — HYDROMORPHONE HCL 2 MG PO TABS
4.0000 mg | ORAL_TABLET | Freq: Four times a day (QID) | ORAL | Status: DC
  Administered 2019-08-16 – 2019-08-17 (×4): 4 mg via ORAL
  Filled 2019-08-16 (×4): qty 2

## 2019-08-16 MED ORDER — CLONAZEPAM 0.5 MG PO TABS
1.0000 mg | ORAL_TABLET | Freq: Two times a day (BID) | ORAL | Status: DC
  Administered 2019-08-16 – 2019-08-17 (×4): 1 mg
  Filled 2019-08-16 (×5): qty 2

## 2019-08-16 MED ORDER — FENTANYL 2500MCG IN NS 250ML (10MCG/ML) PREMIX INFUSION
0.0000 ug/h | INTRAVENOUS | Status: DC
  Administered 2019-08-17: 150 ug/h via INTRAVENOUS
  Filled 2019-08-16: qty 250

## 2019-08-16 NOTE — Progress Notes (Signed)
PULMONARY / CRITICAL CARE MEDICINE   Name: [name] MRN: [id number] DOB: 12-24-1965    ADMISSION DATE:  07/24/2019 CONSULTATION DATE:  08/01/2019  REFERRING MD:  Dr. Roxanne Mins  CHIEF COMPLAINT:  Cardiac Arrest  HISTORY OF PRESENT ILLNESS:   48 yoM ESRD on iHD (unclear last iHD) found unresponsive then witnessed cardiac arrest with ROSC after 3 mins. Required intubation for ongoing airway protection, ongoing unresponsiveness, and hypotensive requiring vasopressor support. In ER, patient did start to wake up, localize, and reaching for ETT.  PAST MEDICAL HISTORY :  . Amputee, below knee, left ([place])   . Anemia   . Blood transfusion   . Bronchitis   . Chest pain   . CHF (congestive heart failure) ([place])   . Chronic kidney disease    dialysis, M-W-F, [facility]  . Diabetes mellitus    type 2  . Dysrhythmia    irregular heartbeat  . ESRD (end stage renal disease) on dialysis ([place])    first HD 06/19/04  . GERD (gastroesophageal reflux disease)   . Hyperparathyroidism ([place])   . Morbid obesity ([place])    PAST SURGICAL HISTORY: - Hx of Calciphylaxis. Prior debridement on 07/21/2019 for large necrotic ulveration on anterior and lateral aspect of L thigh -    Allergies  Allergen Reactions  . Amlodipine Itching  . Dacarbazine And Related Nausea And Vomiting  . Dilaudid [Hydromorphone] Itching  . Fentanyl Itching  . Hydrocodone Nausea And Vomiting  . Propofol Itching    No current facility-administered medications on file prior to encounter.   Current Outpatient Medications on File Prior to Encounter  Medication Sig  . b complex-vitamin c-folic acid (NEPHRO-VITE) 0.8 MG TABS tablet Take 1 tablet by mouth daily.  . calcium acetate (PHOSLO) 667 MG tablet Take 1,334 mg by mouth 3 (three) times daily.  Marland Kitchen COLCRYS 0.6 MG tablet Take 0.6 mg by mouth daily as needed (gout flare).   . hydrALAZINE (APRESOLINE) 25 MG tablet Take 37.5 mg by mouth 3 (three) times daily.  .  isosorbide mononitrate (IMDUR) 30 MG 24 hr tablet Take 30 mg by mouth daily.  Marland Kitchen LEVEMIR FLEXTOUCH 100 UNIT/ML Pen Inject 25 Units into the skin daily.  . metoprolol succinate (TOPROL-XL) 50 MG 24 hr tablet Take 50 mg by mouth daily.  Marland Kitchen NOVOLIN 70/30 FLEXPEN (70-30) 100 UNIT/ML PEN Inject 20 Units into the skin daily.  Marland Kitchen NOVOLOG FLEXPEN 100 UNIT/ML FlexPen Inject 1 Units into the skin at bedtime.  Marland Kitchen oxyCODONE-acetaminophen (PERCOCET) 7.5-325 MG tablet Take 1 tablet by mouth every 4 (four) hours as needed.  Marland Kitchen oxyCODONE-acetaminophen (PERCOCET/ROXICET) 5-325 MG tablet Take 1 tablet by mouth 4 (four) times daily as needed.  . pantoprazole (PROTONIX) 40 MG tablet Take 40 mg by mouth daily.  Marland Kitchen RENVELA 800 MG tablet Take 2,400 mg by mouth 3 (three) times daily.  . rosuvastatin (CRESTOR) 5 MG tablet Take 5 mg by mouth daily.  . TRULICITY [id number] SOPN Inject 1 mg into the skin once a week.  . VELPHORO 500 MG chewable tablet Chew 500 mg by mouth 3 (three) times daily.  . VENTOLIN HFA 108 (90 Base) MCG/ACT inhaler Inhale 2 puffs into the lungs 4 (four) times daily as needed for wheezing or shortness of breath.    FAMILY HISTORY:  His has no family status information on file.   SUBJECTIVE:  Sleepy this morning, although is able to awaken and interact.   VITAL SIGNS: BP (!) 92/58   Pulse 93  Temp (!) 97 F (36.1 C) (Axillary)   Resp 16   Ht 5\' 9"  (1.753 m)   Wt 90 kg   SpO2 100%   BMI 29.30 kg/m   VENTILATOR SETTINGS: Vent Mode: PRVC FiO2 (%):  [30 %-40 %] 30 % Set Rate:  [16 bmp] 16 bmp Vt Set:  [560 mL] 560 mL PEEP:  [5 cmH20] 5 cmH20 Pressure Support:  [15 cmH20] 15 cmH20 Plateau Pressure:  [15 cmH20-18 cmH20] 15 cmH20  INTAKE / OUTPUT: I/O last 3 completed shifts: In: 3017 [I.V.:1266.9; Other:30; NG/GT:1220; IV Piggyback:500.1] Out: [id number]; Stool:550]  PHYSICAL EXAMINATION: General:  Ill-appearing, sleepy, ventilated Neuro:  Sleepy but able to awaken, able to  follow voice commands, is able to breath spontaneously HEENT:  Normocephalic, atraumatic, EOMI, PERRLA, ETT in place Cardiovascular: RRR, clear S1S2 present, no murmurs or gallops appreciated Lungs: patient moving air well, coarse breath sounds appreciated, no wheezing or distinct rhonchi Abdomen:  Soft, nondistended, normal bowel sounds appreciated, nontender to palpation Musculoskeletal:  Left BKA, Calciphylaxis appreciated to anterior and lateral left thigh, extends to groin, no purulent drainage or bleeding appreciated; RLE with 2+ pitting edema  LABS: BMET Recent Labs  Lab 08/15/19 1439 08/15/19 1439 08/16/19 0258 08/16/19 1520 08/16/19 1645  NA 137   < > 140 140 144  K 4.2   < > 4.4 2.9* 3.1*  CL 102  --  101  --  103  CO2 19*  --  16*  --  23  BUN 39*  --  39*  --  32*  CREATININE 3.01*  --  2.73*  --  2.12*  GLUCOSE 262*  --  236*  --  192*   < > = values in this interval not displayed.    Electrolytes Recent Labs  Lab 08/14/19 0238 08/14/19 1356 08/15/19 0516 08/15/19 0516 08/15/19 1439 08/16/19 0258 08/16/19 1645  CALCIUM 8.3*   < > 8.4*   < > 8.2* 8.7* 8.2*  MG 2.5*  --  2.4  --   --  2.9*  --   PHOS 5.9*   < > 3.5   < > 3.6 4.1 2.0*   < > = values in this interval not displayed.    CBC Recent Labs  Lab 08/13/19 0357 08/13/19 0357 08/15/19 1439 08/16/19 0258 08/16/19 1520  WBC 17.9*  --  15.2* 14.4*  --   HGB 8.3*   < > 7.6* 7.1* 7.8*  HCT 30.1*   < > 27.0* 27.1* 23.0*  PLT 397  --  194 166  --    < > = values in this interval not displayed.    Coag's Recent Labs  Lab 07/31/2019 1610 08/13/19 0357 08/14/19 0238 08/15/19 0516 08/16/19 0258  APTT  --    < > 44* 34 180*  INR 1.4*  --   --   --   --    < > = values in this interval not displayed.    Sepsis Markers Recent Labs  Lab 07/27/2019 1610 08/13/19 0357 08/13/19 1439  LATICACIDVEN  --  3.5* 1.8  PROCALCITON 10.03  --   --     ABG Recent Labs  Lab 08/05/2019 0525 08/03/2019 1424  08/16/19 1520  PHART 7.432 7.336* 7.422  PCO2ART 23.0* 29.0* 33.7  PO2ART 89.0 353.0* 177.0*    Liver Enzymes Recent Labs  Lab 07/17/2019 0141 08/13/19 0357 08/15/19 1439 08/16/19 0258 08/16/19 1645  AST 50*  --   --   --   --  ALT 19  --   --   --   --   ALKPHOS 91  --   --   --   --   BILITOT 0.5  --   --   --   --   ALBUMIN 2.2*   < > 2.0* 1.9* 1.8*   < > = values in this interval not displayed.    Cardiac Enzymes No results for input(s): TROPONINI, PROBNP in the last 168 hours.  Glucose Recent Labs  Lab 08/16/19 0319 08/16/19 0322 08/16/19 0437 08/16/19 0755 08/16/19 1143 08/16/19 1552  GLUCAP 72 249* 165* 239* 242* 183*    Imaging DG Chest Port 1 View  Result Date: 08/16/2019 CLINICAL DATA:  Respiratory failure. EXAM: PORTABLE CHEST 1 VIEW COMPARISON:  02/20/2018 FINDINGS: The endotracheal tube is 5.5 cm above the carina. The NG tube is coursing down the esophagus and into the stomach. A central venous catheter is noted in the right atrium coming up from the IVC. Vascular stents are noted. Aortic calcifications are stable. The heart is normal in size. Streaky left basilar atelectasis but overall improved lung aeration since prior study. IMPRESSION: 1. Stable support apparatus. 2. Improved lung aeration with residual streaky left basilar atelectasis. Electronically Signed   By: Marijo Sanes M.D.   On: 08/16/2019 07:07   DG Abd Portable 1V  Result Date: 08/16/2019 CLINICAL DATA:  Enteric tube placement EXAM: PORTABLE ABDOMEN - 1 VIEW COMPARISON:  None. FINDINGS: Enteric tube terminates within the body of the stomach. Femoral approach central line tip overlies the right atrium. Visualized bowel gas pattern is unremarkable. IMPRESSION: Enteric tube terminates within the stomach. Electronically Signed   By: Macy Mis M.D.   On: 08/16/2019 16:34    STUDIES:  1/28 CT Head: 1. No CT evidence of acute intracranial process. 2. Chronic microvascular ischemic disease  and parenchymal volume loss. 3. Diffuse soft tissue edema. Correlate with hydration status. 4. Extensive intracranial atherosclerosis. 5. Secretions in the ethmoids and posterior nasopharynx are likely due to intubation. 6. Mildly proptotic configuration of the globes which is symmetric bilaterally.  CULTURES: 1/28 SARS 2/ Flu A/B >>NGTD 1/28 BC x 2>>NGTD 1/28 MRSA PCR>> Positive.  1/28: Respiratory Culture (Trachial aspirate): MRSA  ANTIBIOTICS: Vanc (1/28- ) Zosyn 1/28-1/31   SIGNIFICANT EVENTS: 1/28 Admitted. On levophed and added vasopressin  1/29: O/N Hyperkalemic, CRRT for correction also GOC discussion with family, made DNR. Still on levophed and multiple analgesics/sedatives.    LINES/TUBES: 1/28 ETT >> 1/28 L Femoral>> 1/28 L external Jugular>> 1/28 R upper arm peripheral>>  Cardiac Arrest in Setting of HFrEF: Prior TTE 05/2019 with LVEF 25-30%, LV global hypokinesis, mild LV hypertrophy, mildly reduced RV function, Indeterminate diastolic filling due to E-A fusion. - Hypothermia protocol was deferred given his intact neurological status - Following telemetry for any ectopy, replete electrolytes as indicated - Frequent neurological evaluation  Prolonged QTc: (505) - Continue telemetry monitoring - Avoiding QTC prolonging medications - Replete electrolytes as indicated - Follow ECG  Multifactorial Shock, likely cardiogenic with superimposed septic shock.  Suspected source MRSA pneumonia.  No obvious skin lesions from his calciphylaxis:  Blood cultures from 1/28 negative, completed Zosyn course. - Continue vancomycin for MRSA pneumonia, plan 8-day course (01/29 - 02/05) - Continue vasopressin and Norepinephrine - Continue stress dose steroids - Solu-Cortef 50mg  IV q6 Will likely need to defer volume removal given his pressor needs  Acute Hypoxic Respiratory Failure: due to pulmonary edema and history of HFrEF, MRSA pneumonia, encephalopathy (improved) -  Mechanical ventilation, PRVC 8 cc/kg - Should be able to initiate SBT's.  Decrease his sedation further to facilitate.  He has tolerated some PSV when stimulated, then becomes apneic. - Volume removal when he can tolerate hemodynamically - Follow chest x-ray  Acute Encephalopathy: Patient is s/p cardiac arrest with seizure-like activity treated with midazolam, now resolved. - Frequent neuro checks, mental status improving, continue to monitor - Begin to wean sedating medications as he can tolerate.  He does have significant pain from his calciphylaxis  Calciphylaxis:  Patient with ESRD and PMH of calciphylaxis.  Adequate pain control currently fairly high-dose fentanyl infusion.   - Continue gabapentin 200mg  BID - Clonazepam 1mg  BID - Dilaudid 4mg  q6 --> STOP FENTANYL - Continue Sodium Thiosulfate 25mg  IV qMWF  ESRD on HD MWF: now on CRRT while hospitalized - Nephrology consulted - appreciate recommendationsAppreciate nephrology management - Continue CRRT, goal volume removal when hemodynamics will allow - Heparin w/ CRRT  Anemia of Chronic Disease: Hgb 7.1 02/01. Was last transfused 2 units RBC 01/28-01/29. - AM CBC - Transfuse Hgb <7 with PRBC.  - Aranesp IV 146mcg every Saturday  Type 2 Diabetes Mellitus: CBG 72 - 278 - mSSI  - Levemir 15 units BID - CBGs q4   Milus Banister, DO PGY-2, [facility] 08/16/2019 7:12 PM

## 2019-08-16 NOTE — Progress Notes (Signed)
Inpatient Diabetes Program Recommendations  AACE/ADA: New Consensus Statement on Inpatient Glycemic Control (2015)  Target Ranges:  Prepandial:   less than 140 mg/dL      Peak postprandial:   less than 180 mg/dL (1-2 hours)      Critically ill patients:  140 - 180 mg/dL   Lab Results  Component Value Date   GLUCAP 242 (H) 08/16/2019   HGBA1C 5.6 08/11/2019    Review of Glycemic Control Results for [name], [name] (MRN [id number]) as of 08/16/2019 13:31  Ref. Range 08/16/2019 03:19 08/16/2019 03:22 08/16/2019 04:37 08/16/2019 07:55 08/16/2019 11:43  Glucose-Capillary Latest Ref Range: 70 - 99 mg/dL 72 249 (H) 165 (H) 239 (H) 242 (H)   Diabetes history: DM 2 Outpatient Diabetes medications:  Levemir 25 units daily, 70/30 20 units daily, Novolog  Current orders for Inpatient glycemic control: Levemir 15 units bid, Novolog 0-15 units q 4 hours- Vial 35 ml/hr Inpatient Diabetes Program Recommendations:    Please consider adding Novolog 3 units q 4 hours.   Thanks  Adah Perl, RN, BC-ADM Inpatient Diabetes Coordinator Pager [phone] (8a-5p)

## 2019-08-16 NOTE — Progress Notes (Signed)
VAST consulted to assess TL femoral line as TPA sticker in place on distal lumen. Pt's nurse reported she changed the cap on the medial lumen and was able to obtain blood return. VAST RN assessed distal lumen per protocol and was able to obtain blood return easily. New cap placed on line, flushed with NS and saline locked. Reported findings to pt's nurse. Discussed with pt's nurse: need to change TL femoral line dressing as Biopatch is saturated with old drainage, need to redress LIJ PIV if it is still being used d/t dressing coming loose and tape across transparent window, and need to dc LIJ PIV if it is not being used; she verbalized understanding regarding entire discussion.

## 2019-08-16 NOTE — Progress Notes (Signed)
[facility] Progress Note  Subjective: pt more responsive. - 1.7 L net yest, wt's down from 93.8 to 90kg, vaso and levo gtts , levo low dose still.  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   Vitals:   08/16/19 0600 08/16/19 0630 08/16/19 0700 08/16/19 0830  BP: 95/60 100/65 106/76 104/68  Pulse:    89  Resp: '15 15 20 19  ' Temp:      TempSrc:      SpO2: 100% 100% 100% 100%  Weight:      Height:        Exam: General: AAM on vent, sedated,  HEENT: ET tube  Eyes: Eyes closed Cor: RRR no RG Lungs: Coarse mechanical breath sounds on vent Abdomen: Soft obese Extremities: bilat 1+ LE edema  Skin: large necrotic wound left lateral upper leg w/ sig tissue loss; distal L BKA stump tissue necrotic also penis gangrene Neuro: Sedated on vent Access right femoral TDC in place with dressing c/d/i  [facility]   R fem TDC   450/800  2/2 bath  86kg   -mircera 200 mcg every 2 weeks last on 1/16 -Sodium thiosulfate 25 gram every treatment  -Weekly venofer -parsabiv 2.5 mg three times a week    CXR 1/28 - no active disease  Assessment/Plan:  # Cardiac arrest/ found down - Supportive care per pulm critical care  # Acute hypoxic respiratory failure - Vent per pulmonology/critcial care --> FiO2 40%  # End-stage renal disease - Normally reported as MWF schedule  - CRRT started 1/28 - cont to keep even - clotting more frequently , getting CRRT heparin, consider citrate if still having problems but seems ok for now per RN  # Shock - likely septic, MRSA growing resp cx, bcx's negative on IV abx, pressors - per CCM  # Anemia acute on chronic - Status post PRBCs - last mircera was on 1/30 at 150 mcg - will increase next dose 2/6 at 233mg  # Acute encephalopathy - more responsive now - s/p arrest  - also with report of seizure activity and s/p midazolam  - Per critical care   # 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 B-complex with vitamin C  1 tablet Per Tube Daily  . chlorhexidine gluconate (MEDLINE KIT)  15 mL Mouth Rinse BID  . Chlorhexidine Gluconate Cloth  6 each Topical Daily  . Chlorhexidine Gluconate Cloth  6 each Topical Q0600  . darbepoetin (ARANESP) injection - DIALYSIS  150 mcg Intravenous Q Sat-HD  . feeding supplement (PRO-STAT SUGAR FREE 64)  60 mL Per Tube TID  . fentaNYL (SUBLIMAZE) injection  25 mcg Intravenous Once  . gabapentin  200 mg Per Tube Q12H  . hydrocortisone sod succinate (SOLU-CORTEF) inj  50 mg Intravenous Q6H  . insulin aspart  0-15 Units Subcutaneous Q4H  . insulin detemir  15 Units Subcutaneous BID  . mouth rinse  15 mL Mouth Rinse 10 times per day  . mupirocin ointment  1 application Nasal BID  . pantoprazole (PROTONIX) IV  40 mg Intravenous QHS  . sodium chloride flush  10-40 mL Intracatheter Q12H   .  prismasol BGK 4/2.5 400 mL/hr at 08/16/19 0705  .  prismasol BGK 4/2.5 200 mL/hr at 08/15/19 1503  . feeding supplement (VITAL 1.5 CAL) 1,000 mL (08/15/19 2055)  . fentaNYL infusion INTRAVENOUS 200 mcg/hr (08/16/19 0700)  . heparin 10,000 units/ 20 mL infusion syringe 2,400 Units/hr (08/16/19 0533)  . midazolam 0.5 mg/hr (08/16/19 0700)  . norepinephrine (LEVOPHED) Adult infusion 2 mcg/min (08/16/19 0700)  . prismasol BGK 2/2.5 dialysis solution 1,800 mL/hr at 08/16/19 0539  . sodium thiosulfate infusion for calciphylaxis Stopped (08/13/19 1407)  . vancomycin Stopped (08/15/19 1326)  . vasopressin (PITRESSIN) infusion - *FOR SHOCK* 0.03 Units/min (08/16/19 0714)   albuterol, bisacodyl, fentaNYL, heparin, heparin, hydrOXYzine, influenza vac split quadrivalent PF, LORazepam, midazolam,  pneumococcal 23 valent vaccine, sodium chloride flush Recent Labs  Lab 08/15/19 1439 08/16/19 0258  NA 137 140  K 4.2 4.4  CL 102 101  CO2 19* 16*  GLUCOSE 262* 236*  BUN 39* 39*  CREATININE 3.01* 2.73*  CALCIUM 8.2* 8.7*  PHOS 3.6 4.1

## 2019-08-16 DEATH — deceased

## 2019-08-17 ENCOUNTER — Ambulatory Visit (INDEPENDENT_AMBULATORY_CARE_PROVIDER_SITE_OTHER): Payer: Medicare Other | Admitting: Vascular Surgery

## 2019-08-17 ENCOUNTER — Inpatient Hospital Stay (HOSPITAL_COMMUNITY): Payer: Medicare Other

## 2019-08-17 DIAGNOSIS — J9600 Acute respiratory failure, unspecified whether with hypoxia or hypercapnia: Secondary | ICD-10-CM

## 2019-08-17 LAB — CBC
HCT: 23.1 % — ABNORMAL LOW (ref 39.0–52.0)
HCT: 33.3 % — ABNORMAL LOW (ref 39.0–52.0)
Hemoglobin: 6.5 g/dL — CL (ref 13.0–17.0)
Hemoglobin: 10 g/dL — ABNORMAL LOW (ref 13.0–17.0)
MCH: 26.3 pg (ref 26.0–34.0)
MCH: 27.2 pg (ref 26.0–34.0)
MCHC: 28.1 g/dL — ABNORMAL LOW (ref 30.0–36.0)
MCHC: 30 g/dL (ref 30.0–36.0)
MCV: 93.5 fL (ref 80.0–100.0)
MCV: 90.5 fL (ref 80.0–100.0)
Platelets: 190 10*3/uL (ref 150–400)
Platelets: 186 10*3/uL (ref 150–400)
RBC: 2.47 MIL/uL — ABNORMAL LOW (ref 4.22–5.81)
RBC: 3.68 MIL/uL — ABNORMAL LOW (ref 4.22–5.81)
RDW: 20.8 % — ABNORMAL HIGH (ref 11.5–15.5)
RDW: 19.4 % — ABNORMAL HIGH (ref 11.5–15.5)
WBC: 13.8 10*3/uL — ABNORMAL HIGH (ref 4.0–10.5)
WBC: 19.4 10*3/uL — ABNORMAL HIGH (ref 4.0–10.5)
nRBC: 2.4 % — ABNORMAL HIGH (ref 0.0–0.2)
nRBC: 1.7 % — ABNORMAL HIGH (ref 0.0–0.2)

## 2019-08-17 LAB — RENAL FUNCTION PANEL
Albumin: 1.8 g/dL — ABNORMAL LOW (ref 3.5–5.0)
Albumin: 2 g/dL — ABNORMAL LOW (ref 3.5–5.0)
Anion gap: 13 (ref 5–15)
Anion gap: 17 — ABNORMAL HIGH (ref 5–15)
BUN: 36 mg/dL — ABNORMAL HIGH (ref 6–20)
BUN: 40 mg/dL — ABNORMAL HIGH (ref 6–20)
CO2: 21 mmol/L — ABNORMAL LOW (ref 22–32)
CO2: 22 mmol/L (ref 22–32)
Calcium: 8 mg/dL — ABNORMAL LOW (ref 8.9–10.3)
Calcium: 8.1 mg/dL — ABNORMAL LOW (ref 8.9–10.3)
Chloride: 102 mmol/L (ref 98–111)
Chloride: 100 mmol/L (ref 98–111)
Creatinine, Ser: 2.29 mg/dL — ABNORMAL HIGH (ref 0.61–1.24)
Creatinine, Ser: 2.43 mg/dL — ABNORMAL HIGH (ref 0.61–1.24)
GFR calc Af Amer: 36 mL/min — ABNORMAL LOW (ref >60)
GFR calc Af Amer: 33 mL/min — ABNORMAL LOW (ref >60)
GFR calc non Af Amer: 31 mL/min — ABNORMAL LOW (ref >60)
GFR calc non Af Amer: 29 mL/min — ABNORMAL LOW (ref >60)
Glucose, Bld: 228 mg/dL — ABNORMAL HIGH (ref 70–99)
Glucose, Bld: 173 mg/dL — ABNORMAL HIGH (ref 70–99)
Phosphorus: 2.3 mg/dL — ABNORMAL LOW (ref 2.5–4.6)
Phosphorus: 2.7 mg/dL (ref 2.5–4.6)
Potassium: 2.9 mmol/L — ABNORMAL LOW (ref 3.5–5.1)
Potassium: 3.5 mmol/L (ref 3.5–5.1)
Sodium: 136 mmol/L (ref 135–145)
Sodium: 139 mmol/L (ref 135–145)

## 2019-08-17 LAB — GLUCOSE, CAPILLARY
Glucose-Capillary: 153 mg/dL — ABNORMAL HIGH (ref 70–99)
Glucose-Capillary: 165 mg/dL — ABNORMAL HIGH (ref 70–99)
Glucose-Capillary: 199 mg/dL — ABNORMAL HIGH (ref 70–99)
Glucose-Capillary: 207 mg/dL — ABNORMAL HIGH (ref 70–99)
Glucose-Capillary: 37 mg/dL — CL (ref 70–99)
Glucose-Capillary: 72 mg/dL (ref 70–99)

## 2019-08-17 LAB — CULTURE, BLOOD (ROUTINE X 2)
Culture: NO GROWTH
Culture: NO GROWTH

## 2019-08-17 LAB — POCT ACTIVATED CLOTTING TIME
Activated Clotting Time: 131 seconds
Activated Clotting Time: 131 seconds
Activated Clotting Time: 186 seconds
Activated Clotting Time: 213 seconds

## 2019-08-17 LAB — MAGNESIUM: Magnesium: 2.7 mg/dL — ABNORMAL HIGH (ref 1.7–2.4)

## 2019-08-17 LAB — PREPARE RBC (CROSSMATCH)

## 2019-08-17 LAB — APTT: aPTT: 37 s — ABNORMAL HIGH (ref 24–36)

## 2019-08-17 MED ORDER — HEPARIN (PORCINE) 2000 UNITS/L FOR CRRT
INTRAVENOUS_CENTRAL | Status: DC | PRN
  Filled 2019-08-17: qty 1000

## 2019-08-17 MED ORDER — MIDODRINE HCL 5 MG PO TABS
10.0000 mg | ORAL_TABLET | Freq: Three times a day (TID) | ORAL | Status: DC
  Administered 2019-08-17 (×2): 10 mg
  Administered 2019-08-17: 10 mg via ORAL
  Administered 2019-08-18 – 2019-08-19 (×6): 10 mg
  Filled 2019-08-17 (×9): qty 2

## 2019-08-17 MED ORDER — POTASSIUM CHLORIDE 20 MEQ/15ML (10%) PO SOLN
40.0000 meq | Freq: Once | ORAL | Status: AC
  Administered 2019-08-17: 09:00:00 40 meq
  Filled 2019-08-17: qty 30

## 2019-08-17 MED ORDER — DEXTROSE 50 % IV SOLN: 25.0000 g | INTRAVENOUS | Status: AC

## 2019-08-17 MED ORDER — HYDROMORPHONE HCL 2 MG PO TABS
4.0000 mg | ORAL_TABLET | Freq: Four times a day (QID) | ORAL | Status: DC
  Administered 2019-08-17: 4 mg via ORAL
  Filled 2019-08-17: qty 2

## 2019-08-17 MED ORDER — DEXTROSE 50 % IV SOLN
INTRAVENOUS | Status: AC
  Administered 2019-08-17: 25 mL via INTRAVENOUS
  Filled 2019-08-17: qty 50

## 2019-08-17 MED ORDER — SODIUM CHLORIDE 0.9% FLUSH
10.0000 mL | Freq: Two times a day (BID) | INTRAVENOUS | Status: DC
  Administered 2019-08-17 – 2019-08-27 (×15): 10 mL

## 2019-08-17 MED ORDER — ALTEPLASE 2 MG IJ SOLR
2.0000 mg | Freq: Once | INTRAMUSCULAR | Status: AC
  Administered 2019-08-17: 2 mg
  Filled 2019-08-17: qty 2

## 2019-08-17 MED ORDER — MIDAZOLAM HCL 2 MG/2ML IJ SOLN
INTRAMUSCULAR | Status: AC
  Filled 2019-08-17: qty 4

## 2019-08-17 MED ORDER — HYDROMORPHONE HCL 2 MG PO TABS
4.0000 mg | ORAL_TABLET | Freq: Four times a day (QID) | ORAL | Status: DC
  Administered 2019-08-17 (×2): 4 mg
  Filled 2019-08-17 (×2): qty 2

## 2019-08-17 MED ORDER — FENTANYL CITRATE (PF) 100 MCG/2ML IJ SOLN
50.0000 ug | INTRAMUSCULAR | Status: DC | PRN
  Administered 2019-08-17: 50 ug via INTRAVENOUS
  Filled 2019-08-17: qty 2

## 2019-08-17 MED ORDER — HYDROMORPHONE HCL 1 MG/ML IJ SOLN
INTRAMUSCULAR | Status: AC
  Filled 2019-08-17: qty 1

## 2019-08-17 MED ORDER — SODIUM CHLORIDE 0.9% FLUSH: 10.0000 mL | INTRAVENOUS | Status: DC | PRN

## 2019-08-17 MED ORDER — ETOMIDATE 2 MG/ML IV SOLN
INTRAVENOUS | Status: AC
  Filled 2019-08-17: qty 20

## 2019-08-17 MED ORDER — HYDROMORPHONE HCL 2 MG PO TABS
4.0000 mg | ORAL_TABLET | Freq: Four times a day (QID) | ORAL | Status: DC
  Filled 2019-08-17: qty 2

## 2019-08-17 MED ORDER — SODIUM CHLORIDE 0.9% IV SOLUTION: Freq: Once | INTRAVENOUS | Status: AC

## 2019-08-17 MED ORDER — HYDROMORPHONE HCL 1 MG/ML IJ SOLN
1.0000 mg | Freq: Once | INTRAMUSCULAR | Status: AC
  Administered 2019-08-17: 1 mg via INTRAVENOUS

## 2019-08-17 MED ORDER — ETOMIDATE 2 MG/ML IV SOLN
15.0000 mg | Freq: Once | INTRAVENOUS | Status: AC
  Administered 2019-08-17: 02:00:00 15 mg via INTRAVENOUS

## 2019-08-17 MED ORDER — ALTEPLASE 2 MG IJ SOLR
2.0000 mg | Freq: Once | INTRAMUSCULAR | Status: AC
  Administered 2019-08-17: 05:00:00 2 mg
  Filled 2019-08-17: qty 2

## 2019-08-17 MED ORDER — HYDROMORPHONE HCL 2 MG PO TABS: 4.0000 mg | ORAL_TABLET | Freq: Four times a day (QID) | ORAL | Status: DC

## 2019-08-17 MED ORDER — MIDAZOLAM HCL 2 MG/2ML IJ SOLN
4.0000 mg | Freq: Once | INTRAMUSCULAR | Status: AC
  Administered 2019-08-17: 02:00:00 4 mg via INTRAVENOUS

## 2019-08-17 MED ORDER — PRISMASOL BGK 4/2.5 32-4-2.5 MEQ/L IV SOLN: INTRAVENOUS | Status: DC

## 2019-08-17 NOTE — Procedures (Signed)
Intubation Procedure Note [name] [id number] Apr 24, 1966  Procedure: Intubation Indications: Respiratory insufficiency - blown cuff on old ETT.  Procedure Details Consent: Unable to obtain consent because of emergent medical necessity. Time Out: Verified patient identification, verified procedure, site/side was marked, verified correct patient position, special equipment/implants available, medications/allergies/relevent history reviewed, required imaging and test results available.  Performed  Drugs:  1mg  Dilaudid, 4mg  mg Versed, 15 mg Etomidate. Bougie passed through old ETT, tube removed, new 7.5 tube placed over bougie. with #  Following intubation:  positive color change on ETCO2, condensation seen in endotracheal tube, equal breath sounds bilaterally.  Evaluation Hemodynamic Status: BP stable throughout; O2 sats: stable throughout Patient's Current Condition: stable Complications: No apparent complications Patient did tolerate procedure well. Chest X-ray ordered to verify placement.  CXR: pending.   [name], [facility] Pager: [phone]  or [phone] 08/17/2019, 1:50 AM

## 2019-08-17 NOTE — Progress Notes (Signed)
eLink Physician-Brief Progress Note Patient Name: [name] DOB: 1966/05/08 MRN: [id number]   Date of Service  08/17/2019  HPI/Events of Note  Pt needing something for breakthrough chronic pain.  Fentanyl gtt turned off today, hoping to extubate in am. Avoiding oversedation.   eICU Interventions  PRN fentanyl 36mcg q2h  Will leave scheduled per tube dilaudid q6h     Intervention Category Intermediate Interventions: Pain - evaluation and management  Darlina Sicilian 08/17/2019, 8:10 PM

## 2019-08-17 NOTE — Progress Notes (Signed)
Unsuccessful at weaning fentanyl. Patient got increasingly agitated throughout the night.  Patient attempted to extubate himself several time, was chewing on the ET tube , tried forcing the ET out with his tongue. ET tube cuff blew. Versed push did not sedate him. Fentanyl was increased to 200 mcg . CCM is aware. ET tube was exchange. HD cath  got occluded, nephrology was notified. TPA is currently in the line to declot it. 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 There were blisters there previously. Skin tears have gotten worse over the past 2 days. May need wound care consult.

## 2019-08-17 NOTE — Progress Notes (Signed)
RT Note:  CCM at bedside for ETT exchange due to blown cuff. No complications 7.5 @ [id number].

## 2019-08-17 NOTE — Progress Notes (Addendum)
[facility] Progress Note  Subjective:agitated overnight, ETT replaced due to patient induced damage. Vaso and levo gtts , levo low dose still.  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  HD catheter clotted and was treated with tPA --> per RN flushes well this AM and CRRT to be resumed.  K 2.9 this AM.   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  Access right femoral TDC in place with dressing c/d/i  [place] MWF   R fem TDC   450/800  2/2 bath  86kg   -mircera 200 mcg every 2 weeks last on 1/16 -Sodium thiosulfate 25 gram every treatment  -Weekly venofer -parsabiv 2.5 mg three times a week    CXR 1/28 - no active disease  Assessment/Plan:  # Cardiac arrest/ found down - Supportive care per pulm critical care  # Acute hypoxic respiratory failure - Vent per pulmonology/critcial care --> FiO2 40% and per RN may do SBT and possibly extubate today  # End-stage renal disease - Normally reported as MWF schedule  - CRRT started 1/28 - cont to keep even - resume CRRT now with heparinized saline prime.    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 B-complex with vitamin C  1 tablet Per Tube Daily  . chlorhexidine gluconate (MEDLINE KIT)  15 mL Mouth Rinse BID  . Chlorhexidine Gluconate Cloth  6 each Topical Daily  . Chlorhexidine Gluconate Cloth  6 each Topical Q0600  . clonazePAM  1 mg Per Tube BID  . darbepoetin (ARANESP) injection - DIALYSIS  150 mcg Intravenous Q Sat-HD  . feeding supplement (PRO-STAT SUGAR FREE 64)  60 mL Per Tube TID  . gabapentin  200 mg Per Tube Q12H  . hydrocortisone sod succinate (SOLU-CORTEF) inj  50 mg Intravenous Q6H  . HYDROmorphone  4 mg Per Tube Q6H  . insulin aspart  0-15 Units  Subcutaneous Q4H  . insulin detemir  15 Units Subcutaneous BID  . mouth rinse  15 mL Mouth Rinse 10 times per day  . mupirocin ointment  1 application Nasal BID  . pantoprazole (PROTONIX) IV  40 mg Intravenous QHS  . potassium chloride  40 mEq Oral Once  . sodium chloride flush  10-40 mL Intracatheter Q12H   .  prismasol BGK 4/2.5 400 mL/hr at 08/16/19 2027  .  prismasol BGK 4/2.5 200 mL/hr at 08/16/19 1634  . feeding supplement (VITAL 1.5 CAL) 1,000 mL (08/16/19 1841)  . fentaNYL infusion INTRAVENOUS 200 mcg/hr (08/17/19 0700)  . heparin 10,000 units/ 20 mL infusion syringe 2,500 Units/hr (08/17/19 0032)  . midazolam Stopped (08/16/19 1658)  . norepinephrine (LEVOPHED) Adult infusion 8 mcg/min (08/17/19 0700)  . prismasol  BGK 4/2.5    . sodium thiosulfate infusion for calciphylaxis Stopped (08/16/19 1448)  . vancomycin Stopped (08/16/19 1312)  . vasopressin (PITRESSIN) infusion - *FOR SHOCK* 0.03 Units/min (08/17/19 0700)   albuterol, bisacodyl, heparin, heparin, hydrOXYzine, influenza vac split quadrivalent PF, LORazepam, midazolam, pneumococcal 23 valent vaccine, sodium chloride flush Recent Labs  Lab 08/16/19 1645 08/17/19 0430  NA 144 136  K 3.1* 2.9*  CL 103 102  CO2 23 21*  GLUCOSE 192* 228*  BUN 32* 36*  CREATININE 2.12* 2.29*  CALCIUM 8.2* 8.0*  PHOS 2.0* 2.3*

## 2019-08-17 NOTE — Progress Notes (Addendum)
Follow up - Critical Care Medicine Note  Patient Details:    [name] is an 54 y.o. 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 Required intubation for ongoing airway protection, ongoing unresponsiveness, and hypotensive requiring vasopressor support. Patient continues to slowly wake up and reaching for ETT.    Lines, Airways, Drains: Airway 7.5 mm (Active)  Secured at (cm) 26 cm 08/17/19 0615  Measured From Lips 08/17/19 0615  Secured Location Left 08/17/19 0615  Secured By [employer] 08/17/19 0615  Tube Holder Repositioned Yes 08/17/19 0615  Cuff Pressure (cm H2O) 26 cm H2O 08/16/19 2015  Site Condition Dry 08/16/19 1526     CVC Triple Lumen 07/18/2019 Left Femoral (Active)  Indication for Insertion or Continuance of Line Vasoactive infusions 08/16/19 2000  Site Assessment Clean;Dry;Intact 08/16/19 2000  Proximal Lumen Status Infusing 08/16/19 2000  Medial Lumen Status Cap changed;Flushed 08/16/19 2000  Distal Lumen Status Blood return noted;In-line blood sampling system in place;Flushed 08/16/19 2000  Dressing Type Transparent;Occlusive 08/16/19 2000  Dressing Status Clean;Dry;Intact;Antimicrobial disc in place 08/16/19 [place] checked and tightened 08/16/19 2000  Dressing Intervention New dressing 08/16/19 1600  Dressing Change Due 08/23/19 08/16/19 2000     NG/OG Tube Left mouth Xray (Active)  External Length of Tube (cm) - (if applicable) 60 cm 0000000 1528  Site Assessment Clean;Dry;Intact 08/16/19 2000  Ongoing Placement Verification No change in cm markings or external length of tube from initial placement;No change in respiratory status;No acute changes, not attributed to clinical condition;Xray 08/16/19 2000  Status Infusing tube feed 08/16/19 2000     Rectal Tube/Pouch (Active)  Output (mL) 350 mL 08/16/19 0135  Intake (mL) 30 mL 08/15/19 1100     Anti-infectives:  Anti-infectives (From admission, onward)   Start     Dose/Rate Route Frequency Ordered Stop   08/13/19 1200  vancomycin (VANCOCIN) IVPB 1000 mg/200 mL premix  Status:  Discontinued     1,000 mg 200 mL/hr over 60 Minutes Intravenous Every M-W-F (Hemodialysis) 07/19/2019 0505 07/28/2019 1041   08/13/19 1200  vancomycin (VANCOREADY) IVPB 750 mg/150 mL     750 mg 150 mL/hr over 60 Minutes Intravenous Every 24 hours 07/29/2019 1050     07/27/2019 1200  piperacillin-tazobactam (ZOSYN) IVPB 3.375 g  Status:  Discontinued     3.375 g 100 mL/hr over 30 Minutes Intravenous Every 6 hours 07/22/2019 1041 08/15/19 1027   07/31/2019 0515  vancomycin (VANCOREADY) IVPB 2000 mg/400 mL     2,000 mg 200 mL/hr over 120 Minutes Intravenous  Once 07/23/2019 0501 08/13/19 1534   08/14/2019 0515  piperacillin-tazobactam (ZOSYN) IVPB 3.375 g  Status:  Discontinued     3.375 g 12.5 mL/hr over 240 Minutes Intravenous Every 12 hours 08/15/2019 0501 08/02/2019 1041      Microbiology: Results for orders placed or performed during the hospital encounter of 08/06/2019  Respiratory Panel by RT PCR (Flu A&B, Covid) - Nasopharyngeal Swab     Status: None   Collection Time: 07/31/2019  1:46 AM   Specimen: Nasopharyngeal Swab  Result Value Ref Range Status   SARS Coronavirus 2 by RT PCR NEGATIVE NEGATIVE Final    Comment: (NOTE) SARS-CoV-2 target nucleic acids are NOT DETECTED. The SARS-CoV-2 RNA is generally detectable in upper respiratoy specimens during the acute phase of infection. The lowest concentration of SARS-CoV-2 viral copies this assay can detect is 131 copies/mL. A negative result  does not preclude SARS-Cov-2 infection and should not be used as the sole basis for treatment or other patient management decisions. A negative result may occur with  improper specimen collection/handling, submission of specimen other than nasopharyngeal swab, presence of viral mutation(s) within the areas targeted by this  assay, and inadequate number of viral copies (<131 copies/mL). A negative result must be combined with clinical observations, patient history, and epidemiological information. The expected result is Negative. Fact Sheet for Patients:  PinkCheek.be Fact Sheet for Healthcare Providers:  GravelBags.it This test is not yet ap proved or cleared by the [place] FDA and  has been authorized for detection and/or diagnosis of SARS-CoV-2 by FDA under an Emergency Use Authorization (EUA). This EUA will remain  in effect (meaning this test can be used) for the duration of the COVID-19 declaration under Section 564(b)(1) of the Act, 21 U.S.C. section 360bbb-3(b)(1), unless the authorization is terminated or revoked sooner.    Influenza A by PCR NEGATIVE NEGATIVE Final   Influenza B by PCR NEGATIVE NEGATIVE Final    Comment: (NOTE) The Xpert Xpress SARS-CoV-2/FLU/RSV assay is intended as an aid in  the diagnosis of influenza from Nasopharyngeal swab specimens and  should not be used as a sole basis for treatment. Nasal washings and  aspirates are unacceptable for Xpert Xpress SARS-CoV-2/FLU/RSV  testing. Fact Sheet for Patients: PinkCheek.be Fact Sheet for Healthcare Providers: GravelBags.it This test is not yet approved or cleared by the [place] FDA and  has been authorized for detection and/or diagnosis of SARS-CoV-2 by  FDA under an Emergency Use Authorization (EUA). This EUA will remain  in effect (meaning this test can be used) for the duration of the  Covid-19 declaration under Section 564(b)(1) of the Act, 21  U.S.C. section 360bbb-3(b)(1), unless the authorization is  terminated or revoked. Performed at [facility], [place] [address] [place], [place] [address]   MRSA PCR Screening     Status: Abnormal   Collection Time: 07/22/2019  7:35 AM   Specimen: Nasal  Mucosa; Nasopharyngeal  Result Value Ref Range Status   MRSA by PCR POSITIVE (A) NEGATIVE Final    Comment:        The GeneXpert MRSA Assay (FDA approved for NASAL specimens only), is one component of a comprehensive MRSA colonization surveillance program. It is not intended to diagnose MRSA infection nor to guide or monitor treatment for MRSA infections. RESULT CALLED TO, READ BACK BY AND VERIFIED WITH: Mikal Plane RN 9:40 07/28/2019 ([email or web address]) Performed at [facility], [place] [address], [place], [place] [address]   Culture, respiratory (non-expectorated)     Status: None   Collection Time: 08/11/2019  7:38 AM   Specimen: Tracheal Aspirate; Respiratory  Result Value Ref Range Status   Specimen Description TRACHEAL ASPIRATE  Final   Special Requests NONE  Final   Gram Stain   Final    FEW WBC PRESENT, PREDOMINANTLY PMN MODERATE GRAM POSITIVE COCCI IN PAIRS IN CLUSTERS MODERATE GRAM NEGATIVE RODS RARE GRAM NEGATIVE COCCOBACILLI Performed at [facility], [place] [address], [place], [place] [address]    Culture   Final    MODERATE METHICILLIN RESISTANT STAPHYLOCOCCUS AUREUS   Report Status 08/15/2019 FINAL  Final   Organism ID, Bacteria METHICILLIN RESISTANT STAPHYLOCOCCUS AUREUS  Final      Susceptibility   Methicillin resistant staphylococcus aureus - MIC*    CIPROFLOXACIN >=8 RESISTANT Resistant     ERYTHROMYCIN >=8 RESISTANT Resistant  GENTAMICIN <=0.5 SENSITIVE Sensitive     OXACILLIN >=4 RESISTANT Resistant     TETRACYCLINE <=1 SENSITIVE Sensitive     VANCOMYCIN 1 SENSITIVE Sensitive     TRIMETH/SULFA <=10 SENSITIVE Sensitive     CLINDAMYCIN <=0.25 SENSITIVE Sensitive     RIFAMPIN <=0.5 SENSITIVE Sensitive     Inducible Clindamycin NEGATIVE Sensitive     * MODERATE METHICILLIN RESISTANT STAPHYLOCOCCUS AUREUS  Culture, blood (routine x 2)     Status: None   Collection Time: 08/14/2019  9:46 AM   Specimen: BLOOD RIGHT ARM  Result Value Ref Range Status   Specimen  Description BLOOD RIGHT ARM  Final   Special Requests   Final    AEROBIC BOTTLE ONLY Blood Culture results may not be optimal due to an inadequate volume of blood received in culture bottles Performed at [facility] [address], [place], [place] [address]    Culture NO GROWTH 5 DAYS  Final   Report Status 08/17/2019 FINAL  Final  Culture, blood (routine x 2)     Status: None   Collection Time: 07/29/2019  9:47 AM   Specimen: BLOOD  Result Value Ref Range Status   Specimen Description BLOOD RIGHT ANTECUBITAL  Final   Special Requests   Final    AEROBIC BOTTLE ONLY Blood Culture results may not be optimal due to an inadequate volume of blood received in culture bottles Performed at [facility], [place] [address] [place], [place] [address]    Culture NO GROWTH 5 DAYS  Final   Report Status 08/17/2019 FINAL  Final    Best Practice/Protocols:  VTE Prophylaxis: Heparin (drip)  Events: 1/28 Admitted. On levophed and added vasopressin  1/29: O/N Hyperkalemic, CRRT for correction also GOC discussion with family, made DNR. Still on levophed and multiple analgesics/sedatives.  02/01: attempted to wean fentanyl unsuccessfully  Studies: CT Head Wo Contrast  Result Date: 07/19/2019 CLINICAL DATA:  Cerebral hemorrhage suspected, unresponsive, found by friend post witnessed arrest EXAM: CT HEAD WITHOUT CONTRAST TECHNIQUE: Contiguous axial images were obtained from the base of the skull through the vertex without intravenous contrast. COMPARISON:  None. FINDINGS: Brain: No evidence of acute infarction, hemorrhage, hydrocephalus, extra-axial collection or mass lesion/mass effect. Symmetric prominence of the ventricles, cisterns and sulci compatible with parenchymal volume loss. Patchy areas of white matter hypoattenuation are most compatible with chronic microvascular angiopathy. Extensive dural calcifications are noted along the falx and tentorium. Vascular: Extensive calcification of the  carotid siphons and vertebral arteries. No unexpected hyperdense vessel is evident. Skull: No calvarial fracture or suspicious osseous lesion. No scalp swelling or hematoma. Diffuse scalp edema is seen. Benign scattered scalp calcifications. Sinuses/Orbits: Patient is intubated at the time of exam with tract transesophageal and endotracheal tubes extending below the level of imaging. Secretions in the ethmoids and posterior nasopharynx are likely related to this intubation. Minimal mucosal thickening in the maxillary sinuses. Prior lens extractions are noted. Mildly proptotic configuration of the globes which is symmetric bilaterally. No abnormal thickening of the extraocular musculature or abnormality of the optic nerve should complexes or superior ophthalmic veins. Other: None IMPRESSION: 1. No CT evidence of acute intracranial process. 2. Chronic microvascular ischemic disease and parenchymal volume loss. 3. Diffuse soft tissue edema.  Correlate with hydration status. 4. Extensive intracranial atherosclerosis. 5. Secretions in the ethmoids and posterior nasopharynx are likely to intubation. 6. Mildly proptotic configuration of the globes which is symmetric bilaterally. Electronically Signed   By: Lovena Le  M.D.   On: 08/13/2019 02:43   DG Chest Port 1 View  Result Date: 08/17/2019 CLINICAL DATA:  Recent cardiac arrest, check endotracheal tube placement EXAM: PORTABLE CHEST 1 VIEW COMPARISON:  08/15/2018 FINDINGS: Endotracheal tube and gastric catheter are noted in satisfactory position. Femoral dialysis catheter is noted in satisfactory position. Mild atelectatic changes are noted in the left base. No effusion is seen. The right subclavian venous stent and brachial venous stent are noted. IMPRESSION: Mild left basilar atelectasis. Tubes and lines as described Electronically Signed   By: Inez Catalina M.D.   On: 08/17/2019 02:21   DG Chest Port 1 View  Result Date: 08/16/2019 CLINICAL DATA:  Respiratory  failure. EXAM: PORTABLE CHEST 1 VIEW COMPARISON:  02/20/2018 FINDINGS: The endotracheal tube is 5.5 cm above the carina. The NG tube is coursing down the esophagus and into the stomach. A central venous catheter is noted in the right atrium coming up from the IVC. Vascular stents are noted. Aortic calcifications are stable. The heart is normal in size. Streaky left basilar atelectasis but overall improved lung aeration since prior study. IMPRESSION: 1. Stable support apparatus. 2. Improved lung aeration with residual streaky left basilar atelectasis. Electronically Signed   By: Marijo Sanes M.D.   On: 08/16/2019 07:07   DG CHEST PORT 1 VIEW  Result Date: 08/09/2019 CLINICAL DATA:  Endotracheal tube placement. EXAM: PORTABLE CHEST 1 VIEW COMPARISON:  Earlier same day FINDINGS: Endotracheal tube tip 5.5 cm from the carina. Orogastric tube enters the abdomen. Femoral venous catheter tip in the right atrium. Slight worsening of density in the mid and lower lungs left more than right could be edema, pneumonia or atelectasis. Upper lungs remain clear. IMPRESSION: Endotracheal tube tip 5.5 cm above the carina. Worsened bilateral lower lung density left more than right could be edema, pneumonia or atelectasis. Electronically Signed   By: Nelson Chimes M.D.   On: 08/03/2019 14:32   DG Chest Port 1 View  Result Date: 07/28/2019 CLINICAL DATA:  Post intubation EXAM: PORTABLE CHEST 1 VIEW COMPARISON:  Radiograph February 20, 2018 FINDINGS: Endotracheal tube terminates in the mid trachea, 5 cm from the carina. Transesophageal tube tip and side port distal to the GE junction, terminating below the level of imaging. Telemetry leads and pacer pads overlie the chest. Some bandlike opacity in the left mid lung likely reflect subsegmental atelectasis. No consolidation, features of edema, pneumothorax, or effusion. The cardiomediastinal contours are unremarkable. No acute osseous or soft tissue abnormality. IMPRESSION: Lines and  tubes as above. Subsegmental atelectasis in the left mid lung. No other acute cardiopulmonary abnormality. Electronically Signed   By: Lovena Le M.D.   On: 08/02/2019 01:59   DG Abd Portable 1V  Result Date: 08/16/2019 CLINICAL DATA:  Enteric tube placement EXAM: PORTABLE ABDOMEN - 1 VIEW COMPARISON:  None. FINDINGS: Enteric tube terminates within the body of the stomach. Femoral approach central line tip overlies the right atrium. Visualized bowel gas pattern is unremarkable. IMPRESSION: Enteric tube terminates within the stomach. Electronically Signed   By: Macy Mis M.D.   On: 08/16/2019 16:34   ECHOCARDIOGRAM COMPLETE  Result Date: 08/13/2019   ECHOCARDIOGRAM REPORT   Patient Name:   REECE ZAMBO Date of Exam: 08/13/2019 Medical Rec #:  [id number]         Height:       69.0 in Accession #:    [id number]        Weight:       193.8 lb  Date of Birth:  15-Mar-1966         BSA:          2.04 m Patient Age:    89 years          BP:           92/66 mmHg Patient Gender: M                 HR:           97 bpm. Exam Location:  Inpatient Procedure: 2D Echo, Cardiac Doppler and Color Doppler Indications:    Cardiac Arrest 427.5  History:        Patient has no prior history of Echocardiogram examinations.  Sonographer:    Jonelle Sidle Dance Referring Phys: [id number] PAULA B SIMPSON  Sonographer Comments: Echo performed with patient supine and on artificial respirator. Image acquisition challenging due to respiratory motion. IMPRESSIONS  1. Left ventricular ejection fraction, by visual estimation, is 30 to 35%. The left ventricle has moderately decreased function. There is mildly increased left ventricular hypertrophy.  2. Left ventricular diastolic parameters are consistent with Grade I diastolic dysfunction (impaired relaxation).  3. The left ventricle demonstrates global hypokinesis.  4. Global right ventricle has mildly reduced systolic function.The right ventricular size is normal. No increase in right  ventricular wall thickness.  5. Left atrial size was normal.  6. Right atrial size was normal.  7. The pericardial effusion is circumferential.  8. Trivial pericardial effusion is present.  9. The mitral valve is normal in structure. No evidence of mitral valve regurgitation. 10. The tricuspid valve is normal in structure. 11. The tricuspid valve is normal in structure. Tricuspid valve regurgitation is trivial. 12. The aortic valve is normal in structure. Aortic valve regurgitation is not visualized. 13. The pulmonic valve was grossly normal. Pulmonic valve regurgitation is not visualized. 14. Normal pulmonary artery systolic pressure. 15. The tricuspid regurgitant velocity is 2.32 m/s, and with an assumed right atrial pressure of 3 mmHg, the estimated right ventricular systolic pressure is normal at 24.5 mmHg. 16. The inferior vena cava is normal in size with greater than 50% respiratory variability, suggesting right atrial pressure of 3 mmHg. 17. No significant change from prior study. 18. Prior images reviewed side by side. FINDINGS  Left Ventricle: Left ventricular ejection fraction, by visual estimation, is 30 to 35%. The left ventricle has moderately decreased function. The left ventricle demonstrates global hypokinesis. The left ventricular internal cavity size was the left ventricle is normal in size. There is mildly increased left ventricular hypertrophy. Concentric left ventricular hypertrophy. Left ventricular diastolic parameters are consistent with Grade I diastolic dysfunction (impaired relaxation). Right Ventricle: The right ventricular size is normal. No increase in right ventricular wall thickness. Global RV systolic function is has mildly reduced systolic function. The tricuspid regurgitant velocity is 2.32 m/s, and with an assumed right atrial pressure of 3 mmHg, the estimated right ventricular systolic pressure is normal at 24.5 mmHg. Left Atrium: Left atrial size was normal in size. Right  Atrium: Right atrial size was normal in size Pericardium: Trivial pericardial effusion is present. The pericardial effusion is circumferential. Mitral Valve: The mitral valve is normal in structure. No evidence of mitral valve regurgitation. Tricuspid Valve: The tricuspid valve is normal in structure. Tricuspid valve regurgitation is trivial. Aortic Valve: The aortic valve is normal in structure. Aortic valve regurgitation is not visualized. Pulmonic Valve: The pulmonic valve was grossly normal. Pulmonic valve regurgitation is not visualized. Pulmonic regurgitation is  not visualized. Aorta: The aortic root and ascending aorta are structurally normal, with no evidence of dilitation. Venous: The inferior vena cava is normal in size with greater than 50% respiratory variability, suggesting right atrial pressure of 3 mmHg. IAS/Shunts: No atrial level shunt detected by color flow Doppler. Additional Comments: Little change from 06/08/2019 (note different MRN).  LEFT VENTRICLE PLAX 2D LVIDd:         4.20 cm  Diastology LVIDs:         3.90 cm  LV e' lateral:   5.78 cm/s LV PW:         1.30 cm  LV E/e' lateral: 7.5 LV IVS:        1.20 cm  LV e' medial:    5.78 cm/s LVOT diam:     2.10 cm  LV E/e' medial:  7.5 LV SV:         13 ml LV SV Index:   6.06 LVOT Area:     3.46 cm  RIGHT VENTRICLE            IVC RV Basal diam:  2.10 cm    IVC diam: 1.40 cm RV S prime:     8.24 cm/s TAPSE (M-mode): 1.6 cm LEFT ATRIUM           Index       RIGHT ATRIUM          Index LA diam:      2.70 cm 1.32 cm/m  RA Area:     9.52 cm LA Vol (A4C): 26.9 ml 13.20 ml/m RA Volume:   18.20 ml 8.93 ml/m  AORTIC VALVE LVOT Vmax:   80.20 cm/s LVOT Vmean:  53.900 cm/s LVOT VTI:    0.126 m  AORTA Ao Root diam: 3.50 cm Ao Asc diam:  2.50 cm MITRAL VALVE                        TRICUSPID VALVE MV Area (PHT): 3.53 cm             TR Peak grad:   21.5 mmHg MV PHT:        62.35 msec           TR Vmax:        232.00 cm/s MV Decel Time: 215 msec MV E velocity:  43.30 cm/s 103 cm/s  SHUNTS MV A velocity: 59.60 cm/s 70.3 cm/s Systemic VTI:  0.13 m MV E/A ratio:  0.73       1.5       Systemic Diam: 2.10 cm  Dani Gobble Croitoru MD Electronically signed by Sanda Klein MD Signature Date/Time: 08/13/2019/2:38:10 PM    Final    Consults: Treatment Team:  Claudia Desanctis, MD Roney Jaffe, MD   Subjective:    Overnight Issues: Per nursing note:  "Unsuccessful at weaning fentanyl. Patient got increasingly agitated throughout the night.  Patient attempted to extubate himself several time, was chewing on the ET tube , tried forcing the ET out with his tongue. ET tube cuff blew. Versed push did not sedate him. Fentanyl was increased to 200 mcg . CCM is aware. ET tube was exchange. HD cath  got occluded, nephrology was notified. TPA is currently in the line to declot it. "  Objective:  Vital signs for last 24 hours: Temp:  [96.6 F (35.9 C)-97.9 F (36.6 C)] 97.9 F (36.6 C) (02/02 0419) Pulse Rate:  [85-93] 85 (02/01 2015) Resp:  [11-26] 16 (  02/02 0700) BP: (78-130)/(57-98) 108/62 (02/02 0700) SpO2:  [100 %] 100 % (02/02 0700) FiO2 (%):  [30 %-40 %] 30 % (02/02 0615) Weight:  [93.2 kg] 93.2 kg (02/02 0500)  Intake/Output from previous day: 02/01 0701 - 02/02 0700 In: 1589.6 [I.V.:644.4; NG/GT:595; IV Piggyback:350.2] Out: 1302   Intake/Output this shift: No intake/output data recorded.  Vent settings for last 24 hours: Vent Mode: PRVC FiO2 (%):  [30 %-40 %] 30 % Set Rate:  [16 bmp] 16 bmp Vt Set:  [560 mL] 560 mL PEEP:  [5 cmH20] 5 cmH20 Pressure Support:  [15 cmH20] 15 cmH20 Plateau Pressure:  [15 cmH20-17 cmH20] 16 cmH20  Physical Exam:  General: alert and no respiratory distress Neuro: alert and nonfocal exam HEENT/Neck: PERRL and ETT Resp: rhonchi bilaterally CVS: regular rate and rhythm, S1, S2 normal, no murmur, click, rub or gallop GI: soft, nontender, BS WNL, no r/g Skin: stable calciphylaxis with proper wound dressing in place to  left anterior/lateral thigh Extremities: no edema, no erythema, pulses WNL and L BKA present; toes of R foot with feeling of crepitus  Assessment/Plan:  Cardiac Arrest in Setting of HFrEF: Prior TTE 05/2019 with LVEF 25-30%, LV global hypokinesis, mild LV hypertrophy, mildly reduced RV function, Indeterminate diastolic filling due to E-A fusion. - Hypothermia protocol was deferred given his intact neurological status - Following telemetry for any ectopy, replete electrolytes as indicated - Frequent neurological evaluation  Acute on Chronic Anemia: Hgb today 6.5 town from 7.1 and 7.6 days prior. Transfusion threshold 7.0. Was last transfused 2 units RBC 01/28-01/29. - Type and Screen - 2u RBC ordered for transfusion - Post transfusion H/H - AM CBC - Aranesp IV 128mcg every Saturday  Hypokalemia: K 2.9 > 3.1 > 2.9 on 02/02.  - KDur 68mEq x1 ordered  Prolonged QTc: (505) - Continue telemetry monitoring - Avoiding QTC prolonging medications - Replete electrolytes as indicated - Follow ECG  Multifactorial Shock, likely cardiogenic with superimposed septic shock. Suspected source MRSA pneumonia. Worsening skin lesions from his calciphylaxis: Blood cultures from 1/28 negative, completed Zosyn course. BPs lower w/ Vasopressin decreased, still on levo. - Continue vancomycin for MRSA pneumonia, plan 8-day course (01/29 - 02/05) - Continue vasopressin and Norepinephrine - Continue stress dose steroids - Solu-Cortef 50mg  IV q6 - Midodrine 10mg  TID for low BPs  Acute Hypoxic Respiratory Failure:due topulmonary edema and history of HFrEF, MRSA pneumonia, encephalopathy (improved). Remains mechanically ventilated.  - Decrease his sedation further to facilitate. - Follow chest x-rays - Would benefit from diuresis when he can tolerate such Hemodynamically  Acute Encephalopathy, improving: Patient is s/p cardiac arrest with seizure-like activity treated with midazolam, now resolved. -  Frequent neuro checks, mental status improving, continue to monitor - Begin to wean sedating medications as he can tolerate. He does have significant pain from his calciphylaxis - Has Versed IV 2mg  q2 PRN RASS Goal - Has Ativan 2mg  IV PRN seizure  Calciphylaxis, stable:  Patient with ESRD and PMH of calciphylaxis.Adequate pain control currently fairly high-dose fentanyl infusion.  - Continue gabapentin 200mg  BID - Clonazepam 1mg  BID - Dilaudid 4mg  q6 - plan to wean Fentanyl - Continue Sodium Thiosulfate 25mg  IV qMWF  ESRD on HD MWF: now on CRRT while hospitalized - Nephrology consulted - appreciate recommendationsAppreciate nephrology management - Continue CRRT, goal volume removal when hemodynamics will allow - Heparin w/ CRRT   Type 2 Diabetes Mellitus:CBG 165-211 ON 02/02 - mSSI  - Levemir 15 units BID - CBGs q4  LOS: 5 days   Additional comments:None    Milus Banister, [place], PGY-2 08/17/2019 8:12 AM

## 2019-08-18 ENCOUNTER — Inpatient Hospital Stay (HOSPITAL_COMMUNITY): Payer: Medicare Other

## 2019-08-18 ENCOUNTER — Inpatient Hospital Stay: Payer: Self-pay

## 2019-08-18 LAB — CBC
HCT: 35.8 % — ABNORMAL LOW (ref 39.0–52.0)
HCT: 34.7 % — ABNORMAL LOW (ref 39.0–52.0)
Hemoglobin: 10.7 g/dL — ABNORMAL LOW (ref 13.0–17.0)
Hemoglobin: 10.5 g/dL — ABNORMAL LOW (ref 13.0–17.0)
MCH: 26.9 pg (ref 26.0–34.0)
MCH: 27.4 pg (ref 26.0–34.0)
MCHC: 29.9 g/dL — ABNORMAL LOW (ref 30.0–36.0)
MCHC: 30.3 g/dL (ref 30.0–36.0)
MCV: 89.9 fL (ref 80.0–100.0)
MCV: 90.6 fL (ref 80.0–100.0)
Platelets: 204 10*3/uL (ref 150–400)
Platelets: 173 10*3/uL (ref 150–400)
RBC: 3.98 MIL/uL — ABNORMAL LOW (ref 4.22–5.81)
RBC: 3.83 MIL/uL — ABNORMAL LOW (ref 4.22–5.81)
RDW: 20.4 % — ABNORMAL HIGH (ref 11.5–15.5)
RDW: 20.8 % — ABNORMAL HIGH (ref 11.5–15.5)
WBC: 17.2 10*3/uL — ABNORMAL HIGH (ref 4.0–10.5)
WBC: 14.4 10*3/uL — ABNORMAL HIGH (ref 4.0–10.5)
nRBC: 1.6 % — ABNORMAL HIGH (ref 0.0–0.2)
nRBC: 1.9 % — ABNORMAL HIGH (ref 0.0–0.2)

## 2019-08-18 LAB — TYPE AND SCREEN
ABO/RH(D): A POS
Antibody Screen: NEGATIVE
Unit division: 0
Unit division: 0

## 2019-08-18 LAB — RENAL FUNCTION PANEL
Albumin: 2.1 g/dL — ABNORMAL LOW (ref 3.5–5.0)
Albumin: 1.9 g/dL — ABNORMAL LOW (ref 3.5–5.0)
Albumin: 1.7 g/dL — ABNORMAL LOW (ref 3.5–5.0)
Anion gap: 13 (ref 5–15)
Anion gap: 15 (ref 5–15)
Anion gap: 16 — ABNORMAL HIGH (ref 5–15)
BUN: 26 mg/dL — ABNORMAL HIGH (ref 6–20)
BUN: 38 mg/dL — ABNORMAL HIGH (ref 6–20)
BUN: 28 mg/dL — ABNORMAL HIGH (ref 6–20)
CO2: 25 mmol/L (ref 22–32)
CO2: 23 mmol/L (ref 22–32)
CO2: 18 mmol/L — ABNORMAL LOW (ref 22–32)
Calcium: 8 mg/dL — ABNORMAL LOW (ref 8.9–10.3)
Calcium: 8 mg/dL — ABNORMAL LOW (ref 8.9–10.3)
Calcium: 6.9 mg/dL — ABNORMAL LOW (ref 8.9–10.3)
Chloride: 100 mmol/L (ref 98–111)
Chloride: 101 mmol/L (ref 98–111)
Chloride: 108 mmol/L (ref 98–111)
Creatinine, Ser: 1.57 mg/dL — ABNORMAL HIGH (ref 0.61–1.24)
Creatinine, Ser: 2.51 mg/dL — ABNORMAL HIGH (ref 0.61–1.24)
Creatinine, Ser: 1.84 mg/dL — ABNORMAL HIGH (ref 0.61–1.24)
GFR calc Af Amer: 57 mL/min — ABNORMAL LOW (ref >60)
GFR calc Af Amer: 32 mL/min — ABNORMAL LOW (ref >60)
GFR calc Af Amer: 47 mL/min — ABNORMAL LOW (ref >60)
GFR calc non Af Amer: 49 mL/min — ABNORMAL LOW (ref >60)
GFR calc non Af Amer: 28 mL/min — ABNORMAL LOW (ref >60)
GFR calc non Af Amer: 40 mL/min — ABNORMAL LOW (ref >60)
Glucose, Bld: 212 mg/dL — ABNORMAL HIGH (ref 70–99)
Glucose, Bld: 187 mg/dL — ABNORMAL HIGH (ref 70–99)
Glucose, Bld: 386 mg/dL — ABNORMAL HIGH (ref 70–99)
Phosphorus: 1.7 mg/dL — ABNORMAL LOW (ref 2.5–4.6)
Phosphorus: 2.8 mg/dL (ref 2.5–4.6)
Phosphorus: 2.1 mg/dL — ABNORMAL LOW (ref 2.5–4.6)
Potassium: 2.3 mmol/L — CL (ref 3.5–5.1)
Potassium: 3.3 mmol/L — ABNORMAL LOW (ref 3.5–5.1)
Potassium: 3.5 mmol/L (ref 3.5–5.1)
Sodium: 138 mmol/L (ref 135–145)
Sodium: 139 mmol/L (ref 135–145)
Sodium: 142 mmol/L (ref 135–145)

## 2019-08-18 LAB — MAGNESIUM
Magnesium: 2.5 mg/dL — ABNORMAL HIGH (ref 1.7–2.4)
Magnesium: 2.6 mg/dL — ABNORMAL HIGH (ref 1.7–2.4)

## 2019-08-18 LAB — GLUCOSE, CAPILLARY
Glucose-Capillary: 204 mg/dL — ABNORMAL HIGH (ref 70–99)
Glucose-Capillary: 251 mg/dL — ABNORMAL HIGH (ref 70–99)
Glucose-Capillary: 50 mg/dL — ABNORMAL LOW (ref 70–99)
Glucose-Capillary: 60 mg/dL — ABNORMAL LOW (ref 70–99)
Glucose-Capillary: 67 mg/dL — ABNORMAL LOW (ref 70–99)
Glucose-Capillary: <10 mg/dL — CL (ref 70–99)

## 2019-08-18 LAB — BPAM RBC
Blood Product Expiration Date: 202102092359
Blood Product Expiration Date: 202102092359
ISSUE DATE / TIME: 202102021126
ISSUE DATE / TIME: 202102021126
Unit Type and Rh: 6200
Unit Type and Rh: 6200

## 2019-08-18 LAB — APTT
aPTT: >200 seconds (ref 24–36)
aPTT: 48 seconds — ABNORMAL HIGH (ref 24–36)

## 2019-08-18 MED ORDER — POTASSIUM CHLORIDE 20 MEQ PO PACK
40.0000 meq | PACK | Freq: Once | ORAL | Status: AC
  Administered 2019-08-18: 40 meq via ORAL
  Filled 2019-08-18: qty 2

## 2019-08-18 MED ORDER — DEXTROSE 50 % IV SOLN
12.5000 g | INTRAVENOUS | Status: AC
  Administered 2019-08-18: 12.5 g via INTRAVENOUS
  Filled 2019-08-18: qty 50

## 2019-08-18 MED ORDER — B COMPLEX-C PO TABS
1.0000 | ORAL_TABLET | Freq: Every day | ORAL | Status: DC
  Administered 2019-08-18: 1 via ORAL
  Filled 2019-08-18 (×2): qty 1

## 2019-08-18 MED ORDER — SENNOSIDES-DOCUSATE SODIUM 8.6-50 MG PO TABS
1.0000 | ORAL_TABLET | Freq: Every day | ORAL | Status: DC | PRN
  Administered 2019-08-24 – 2019-08-25 (×2): 1 via ORAL
  Filled 2019-08-18 (×2): qty 1

## 2019-08-18 MED ORDER — CLONAZEPAM 0.5 MG PO TABS
1.0000 mg | ORAL_TABLET | Freq: Two times a day (BID) | ORAL | Status: DC
  Administered 2019-08-18 – 2019-08-20 (×5): 1 mg via ORAL
  Filled 2019-08-18 (×4): qty 2

## 2019-08-18 MED ORDER — DULOXETINE HCL 20 MG PO CPEP
20.0000 mg | ORAL_CAPSULE | Freq: Every day | ORAL | Status: DC
  Administered 2019-08-19 – 2019-08-28 (×10): 20 mg via ORAL
  Filled 2019-08-18 (×10): qty 1

## 2019-08-18 MED ORDER — DARBEPOETIN ALFA 150 MCG/0.3ML IJ SOSY
150.0000 ug | PREFILLED_SYRINGE | INTRAMUSCULAR | Status: DC
  Filled 2019-08-18: qty 0.3

## 2019-08-18 MED ORDER — GABAPENTIN 250 MG/5ML PO SOLN
200.0000 mg | Freq: Every day | ORAL | Status: DC
  Filled 2019-08-18: qty 4

## 2019-08-18 MED ORDER — PRO-STAT SUGAR FREE PO LIQD: 60.0000 mL | Freq: Three times a day (TID) | ORAL | Status: DC

## 2019-08-18 MED ORDER — GABAPENTIN 250 MG/5ML PO SOLN
200.0000 mg | Freq: Every day | ORAL | Status: DC
  Administered 2019-08-18 – 2019-08-27 (×9): 200 mg via ORAL
  Filled 2019-08-18 (×11): qty 4

## 2019-08-18 MED ORDER — CINACALCET HCL 30 MG PO TABS
30.0000 mg | ORAL_TABLET | Freq: Every day | ORAL | Status: DC
  Administered 2019-08-19 – 2019-08-28 (×10): 30 mg via ORAL
  Filled 2019-08-18 (×11): qty 1

## 2019-08-18 MED ORDER — ALTEPLASE 2 MG IJ SOLR
2.0000 mg | Freq: Once | INTRAMUSCULAR | Status: AC
  Administered 2019-08-18: 2 mg
  Filled 2019-08-18: qty 2

## 2019-08-18 MED ORDER — DEXTROSE 50 % IV SOLN
INTRAVENOUS | Status: AC
  Administered 2019-08-18: 12.5 g via INTRAVENOUS
  Filled 2019-08-18: qty 50

## 2019-08-18 MED ORDER — INSULIN DETEMIR 100 UNIT/ML ~~LOC~~ SOLN
8.0000 [IU] | Freq: Two times a day (BID) | SUBCUTANEOUS | Status: DC
  Administered 2019-08-18 – 2019-08-28 (×17): 8 [IU] via SUBCUTANEOUS
  Filled 2019-08-18 (×21): qty 0.08

## 2019-08-18 MED ORDER — INSULIN ASPART 100 UNIT/ML ~~LOC~~ SOLN
0.0000 [IU] | Freq: Four times a day (QID) | SUBCUTANEOUS | Status: DC
  Administered 2019-08-19: 2 [IU] via SUBCUTANEOUS
  Administered 2019-08-20 (×2): 3 [IU] via SUBCUTANEOUS
  Administered 2019-08-20 – 2019-08-21 (×2): 2 [IU] via SUBCUTANEOUS
  Administered 2019-08-21: 01:00:00 3 [IU] via SUBCUTANEOUS
  Administered 2019-08-22: 0.3 [IU] via SUBCUTANEOUS
  Administered 2019-08-22 (×2): 2 [IU] via SUBCUTANEOUS
  Administered 2019-08-22: 3 [IU] via SUBCUTANEOUS
  Administered 2019-08-23 (×2): 2 [IU] via SUBCUTANEOUS
  Administered 2019-08-23 – 2019-08-24 (×2): 3 [IU] via SUBCUTANEOUS
  Administered 2019-08-24 – 2019-08-25 (×2): 2 [IU] via SUBCUTANEOUS

## 2019-08-18 MED ORDER — ALTEPLASE 2 MG IJ SOLR
2.0000 mg | Freq: Once | INTRAMUSCULAR | Status: AC
  Administered 2019-08-18: 06:00:00 2 mg
  Filled 2019-08-18: qty 2

## 2019-08-18 MED ORDER — HYDROXYZINE HCL 10 MG/5ML PO SYRP
25.0000 mg | ORAL_SOLUTION | Freq: Three times a day (TID) | ORAL | Status: DC | PRN
  Filled 2019-08-18: qty 12.5

## 2019-08-18 MED ORDER — DEXTROSE 50 % IV SOLN: 12.5000 g | INTRAVENOUS | Status: AC

## 2019-08-18 MED ORDER — HYDROMORPHONE HCL 2 MG PO TABS
4.0000 mg | ORAL_TABLET | Freq: Four times a day (QID) | ORAL | Status: DC
  Administered 2019-08-18 – 2019-08-20 (×7): 4 mg via ORAL
  Filled 2019-08-18 (×7): qty 2

## 2019-08-18 MED ORDER — DARBEPOETIN ALFA 200 MCG/0.4ML IJ SOSY: 200.0000 ug | PREFILLED_SYRINGE | INTRAMUSCULAR | Status: DC

## 2019-08-18 NOTE — Progress Notes (Signed)
eLink Physician-Brief Progress Note Patient Name: [name] DOB: 1965/12/30 MRN: [id number]   Date of Service  08/18/2019  HPI/Events of Note  Patient with cuff leak again. Sedation stopped and he has likely bitten through his pilot tube producing the cuff leak.   eICU Interventions  Will ask ground team to evaluate at bedside and either extubate or resedate and change ETT.     Intervention Category Major Interventions: Other:  Abrielle Finck Cornelia Copa 08/18/2019, 1:56 AM

## 2019-08-18 NOTE — Evaluation (Signed)
Clinical/Bedside Swallow Evaluation Patient Details  Name: [name] MRN: [id number] Date of Birth: 01/06/1966  Today's Date: 08/18/2019 Time: SLP Start Time (ACUTE ONLY): [id number] SLP Stop Time (ACUTE ONLY): 1556 SLP Time Calculation (min) (ACUTE ONLY): 11 min  Past Medical History: No past medical history on file. Past Surgical History: History reviewed. No pertinent surgical history. HPI:  [name] is an 54 y.o. male with history of ESRD on HD with witnessed cardiac arrest, obtained ROSC after 3 minutes.  Required intubation1/28-2/3 for ongoing airway protection, ongoing unresponsiveness, and hypotension requiring vasopressor support.  Also with MRSA pneumonia on vancomycin.    Assessment / Plan / Recommendation Clinical Impression  Pt extubated early this morning after 7 days following cardiac arrest. Vocal quality minimally hoarse with adequate intensity. Three ounze water consumed without s/s aspiration and coordinated swallow and respiratory cycles. Timely and efficient mastication throughout evaluation. SLP will order regular texture, thin liquids and pills with thin. No follow up needed.   SLP Visit Diagnosis: Dysphagia, unspecified (R13.10)    Aspiration Risk  Mild aspiration risk    Diet Recommendation Regular;Thin liquid   Liquid Administration via: Straw;Cup Medication Administration: Whole meds with liquid Supervision: Patient able to self feed Compensations: Slow rate;Small sips/bites Postural Changes: Seated upright at 90 degrees    Other  Recommendations Oral Care Recommendations: Oral care BID   Follow up Recommendations None      Frequency and Duration            Prognosis        Swallow Study   General Date of Onset: 08/05/2019 HPI: [name] is an 54 y.o. male with history of ESRD on HD with witnessed cardiac arrest, obtained ROSC after 3 minutes.  Required intubation1/28-2/3 for ongoing airway protection, ongoing unresponsiveness,  and hypotension requiring vasopressor support.  Also with MRSA pneumonia on vancomycin.  Type of Study: Bedside Swallow Evaluation Previous Swallow Assessment: none Diet Prior to this Study: NPO Temperature Spikes Noted: No Respiratory Status: Nasal cannula History of Recent Intubation: Yes Length of Intubations (days): 7 days Date extubated: 08/18/19(early am) Behavior/Cognition: Alert;Cooperative;Pleasant mood Oral Cavity Assessment: Within Functional Limits Oral Care Completed by SLP: No Oral Cavity - Dentition: Poor condition;Missing dentition(missing upper anterior and some posterior) Vision: Functional for self-feeding Self-Feeding Abilities: Able to feed self Patient Positioning: Upright in bed Baseline Vocal Quality: Normal Volitional Cough: Strong Volitional Swallow: Able to elicit    Oral/Motor/Sensory Function Overall Oral Motor/Sensory Function: Within functional limits   Ice Chips Ice chips: Not tested   Thin Liquid Thin Liquid: Within functional limits Presentation: Cup;Straw    Nectar Thick Nectar Thick Liquid: Not tested   Honey Thick Honey Thick Liquid: Not tested   Puree Puree: Within functional limits   Solid     Solid: Within functional limits      Houston Siren 08/18/2019,4:18 PM   Orbie Pyo Barnum.Ed Risk analyst [phone] Office [phone]

## 2019-08-18 NOTE — Progress Notes (Signed)
Inpatient Diabetes Program Recommendations  AACE/ADA: New Consensus Statement on Inpatient Glycemic Control (2015)  Target Ranges:  Prepandial:   less than 140 mg/dL      Peak postprandial:   less than 180 mg/dL (1-2 hours)      Critically ill patients:  140 - 180 mg/dL   Lab Results  Component Value Date   GLUCAP 204 (H) 08/18/2019   HGBA1C 5.6 08/03/2019    Review of Glycemic Control Results for [name], [name] (MRN [id number]) as of 08/18/2019 11:27  Ref. Range 08/17/2019 19:56 08/17/2019 23:46 08/18/2019 00:30 08/18/2019 01:59 08/18/2019 03:37  Glucose-Capillary Latest Ref Range: 70 - 99 mg/dL 72 37 (LL) 67 (L) 251 (H) 204 (H)   Diabetes history: DM 2 Outpatient Diabetes medications:  Levemir 25 units daily Novolin 70/30- 20 units daily Trulicity 1 mg weekly Current orders for Inpatient glycemic control:  Novolog moderate q 4 hours Vital 35 ml/hr Levemir 15 units bid Inpatient Diabetes Program Recommendations:   Note steroids stopped. Please consider reducing Levemir to 8 units bid.   Thanks  Adah Perl, RN, BC-ADM Inpatient Diabetes Coordinator Pager [phone] (8a-5p)

## 2019-08-18 NOTE — Progress Notes (Signed)
eLink Physician-Brief Progress Note Patient Name: [name] DOB: 06/11/1966 MRN: [id number]   Date of Service  08/18/2019  HPI/Events of Note  Bedside RN reporting highly inaccurate CBG readings, poor peripheral circulation. Multiple CBG machines report CBG of <50 but serum glucose 356.   eICU Interventions  Serum glucose q6h (twice daily renal function panel already ordered)  Change SSI to q6h      Intervention Category Intermediate Interventions: Hyperglycemia - evaluation and treatment  Darlina Sicilian 08/18/2019, 10:13 PM

## 2019-08-18 NOTE — Progress Notes (Signed)
SLP Cancellation Note  Patient Details Name: [name] MRN: [id number] DOB: 07-06-1966   Cancelled treatment:        On vent- will follow    Houston Siren 08/18/2019, 7:16 AM Orbie Pyo Colvin Caroli.Ed Risk analyst [phone] Office [phone]

## 2019-08-18 NOTE — Progress Notes (Signed)
[facility] Progress Note  Subjective: bit through cuff of ETT overnight and was extubated to [place].  He's on scheduled po pain meds now and is comfortable.  BPs this AM good off vasopresors.    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  Access right femoral TDC in place with dressing c/d/i  [place] MWF   R fem TDC   450/800  2/2 bath  86kg   -mircera 200 mcg every 2 weeks last on 1/16 -Sodium thiosulfate 25 gram every treatment  -Weekly venofer -parsabiv 2.5 mg three times a week    CXR 1/28 - no active disease  Assessment/Plan:  # Cardiac arrest/ found down - Supportive care per pulm critical care--> improving  # Acute hypoxic respiratory failure - extubated to [place] now  # End-stage renal disease - Normally reported as MWF schedule  - CRRT started 1/28 - cont to keep even but if clots during the day will hold. 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 B-complex with vitamin C  1 tablet Per Tube Daily  . chlorhexidine gluconate (MEDLINE KIT)  15 mL Mouth Rinse BID  . Chlorhexidine Gluconate Cloth  6 each Topical Daily  . clonazePAM  1 mg Per Tube BID  . darbepoetin (ARANESP) injection - DIALYSIS  150 mcg Intravenous Q Sat-HD  . feeding supplement (PRO-STAT SUGAR FREE 64)  60 mL Per Tube TID  . gabapentin  200 mg Per Tube Q12H  . HYDROmorphone  4 mg Per Tube Q6H  . insulin aspart  0-15 Units Subcutaneous Q4H  . insulin detemir  15 Units Subcutaneous BID  . mouth rinse  15 mL Mouth Rinse 10 times per day  . midodrine  10 mg Per Tube TID WC  . mupirocin ointment  1 application Nasal BID  . pantoprazole (PROTONIX) IV  40 mg Intravenous QHS  . sodium chloride flush  10-40 mL Intracatheter Q12H  . sodium chloride flush  10-40 mL Intracatheter Q12H   .  prismasol BGK 4/2.5 400 mL/hr at 08/16/19 2027  .  prismasol BGK 4/2.5 200 mL/hr at 08/16/19 1634  . feeding supplement (VITAL 1.5 CAL) Stopped (08/18/19 0200)  . heparin 10,000 units/ 20 mL infusion syringe 2,500 Units/hr (08/18/19 0047)  . midazolam Stopped (08/16/19 1658)  . norepinephrine (LEVOPHED) Adult infusion Stopped (08/18/19 0135)  . prismasol BGK 4/2.5 1,800 mL/hr at 08/18/19 0149  . sodium thiosulfate infusion for calciphylaxis Stopped (08/16/19 1448)  . vancomycin Stopped (08/17/19 1250)   albuterol, bisacodyl, fentaNYL (SUBLIMAZE) injection, heparin, heparin, heparin, hydrOXYzine, influenza vac split quadrivalent PF, LORazepam, midazolam, pneumococcal 23 valent vaccine, sodium chloride flush, sodium chloride flush Recent Labs  Lab 08/18/19 0341 08/18/19 0615   NA 138 139  K 2.3* 3.3*  CL 100 101  CO2 25 23  GLUCOSE 212* 187*  BUN 26* 38*  CREATININE 1.57* 2.51*  CALCIUM 8.0* 8.0*  PHOS 1.7* 2.8

## 2019-08-18 NOTE — Progress Notes (Signed)
Evaluated patient for cuff leak.   Bit through pilot balloon.  Does well on PSV trial, (insp volumes good), satting well, not tachypneic, awake and following commands.   Extubated.   Tolerating [place] well.

## 2019-08-18 NOTE — Progress Notes (Signed)
RN called Dr. Ronnette Juniper due to patient having a cuff leak and the patient biting through the tube. Waiting for orders.

## 2019-08-18 NOTE — Procedures (Signed)
Extubation Procedure Note  Patient Details:   Name: [name] DOB: 1965/12/02 MRN: [id number]   Airway Documentation:    Vent end date: 08/18/19 Vent end time: 0212   Evaluation  O2 sats: stable throughout Complications: No apparent complications Patient did tolerate procedure well. Bilateral Breath Sounds: Diminished, Clear   Patient extubated to 3L [name].  Cuff Leak noted (cuff blown)  Patient able to vocalize.   [name] [facility] 08/18/2019, 2:29 AM

## 2019-08-18 NOTE — Progress Notes (Signed)
Follow up - Critical Care Medicine Note  Patient Details:    [name] is an 54 y.o. male with history of ESRD on HD with witnessed cardiac arrest, obtained ROSC after 3 minutes.  Required intubation for ongoing airway protection, ongoing unresponsiveness, and hypotension requiring vasopressor support.  Also with MRSA pneumonia on vancomycin.  Patient extubated 08/18/2019, off pressors.  Lines, Airways, Drains: CVC Triple Lumen 08/07/2019 Left Femoral (Active)  Indication for Insertion or Continuance of Line Poor Vasculature-patient has had multiple peripheral attempts or PIVs lasting less than 24 hours 08/17/19 2000  Site Assessment Clean;Dry;Intact 08/18/19 0550  Proximal Lumen Status Flushed;Saline locked;No blood return 08/18/19 0550  Medial Lumen Status Flushed;Saline locked;Blood return noted 08/18/19 0550  Distal Lumen Status In-line blood sampling system in place 08/18/19 0550  Dressing Type Transparent;Occlusive;Securing device 08/18/19 0550  Dressing Status Clean;Dry;Intact;Antimicrobial disc in place 08/18/19 0550  Line Care Connections checked and tightened 08/17/19 2000  Dressing Intervention New dressing 08/16/19 1600  Dressing Change Due 08/23/19 08/18/19 0550     Rectal Tube/Pouch (Active)  Output (mL) 350 mL 08/16/19 0135  Intake (mL) 30 mL 08/15/19 1100    Anti-infectives:  Anti-infectives (From admission, onward)   Start     Dose/Rate Route Frequency Ordered Stop   08/13/19 1200  vancomycin (VANCOCIN) IVPB 1000 mg/200 mL premix  Status:  Discontinued     1,000 mg 200 mL/hr over 60 Minutes Intravenous Every M-W-F (Hemodialysis) 07/28/2019 0505 08/10/2019 1041   08/13/19 1200  vancomycin (VANCOREADY) IVPB 750 mg/150 mL     750 mg 150 mL/hr over 60 Minutes Intravenous Every 24 hours 07/16/2019 1050     07/18/2019 1200  piperacillin-tazobactam (ZOSYN) IVPB 3.375 g  Status:  Discontinued     3.375 g 100 mL/hr over 30 Minutes Intravenous Every 6 hours 07/17/2019 1041  08/15/19 1027   07/24/2019 0515  vancomycin (VANCOREADY) IVPB 2000 mg/400 mL     2,000 mg 200 mL/hr over 120 Minutes Intravenous  Once 08/07/2019 0501 08/13/19 1534   07/20/2019 0515  piperacillin-tazobactam (ZOSYN) IVPB 3.375 g  Status:  Discontinued     3.375 g 12.5 mL/hr over 240 Minutes Intravenous Every 12 hours 07/28/2019 0501 08/11/2019 1041      Microbiology: Results for orders placed or performed during the hospital encounter of 07/16/2019  Respiratory Panel by RT PCR (Flu A&B, Covid) - Nasopharyngeal Swab     Status: None   Collection Time: 08/14/2019  1:46 AM   Specimen: Nasopharyngeal Swab  Result Value Ref Range Status   SARS Coronavirus 2 by RT PCR NEGATIVE NEGATIVE Final    Comment: (NOTE) SARS-CoV-2 target nucleic acids are NOT DETECTED. The SARS-CoV-2 RNA is generally detectable in upper respiratoy specimens during the acute phase of infection. The lowest concentration of SARS-CoV-2 viral copies this assay can detect is 131 copies/mL. A negative result does not preclude SARS-Cov-2 infection and should not be used as the sole basis for treatment or other patient management decisions. A negative result may occur with  improper specimen collection/handling, submission of specimen other than nasopharyngeal swab, presence of viral mutation(s) within the areas targeted by this assay, and inadequate number of viral copies (<131 copies/mL). A negative result must be combined with clinical observations, patient history, and epidemiological information. The expected result is Negative. Fact Sheet for Patients:  PinkCheek.be Fact Sheet for Healthcare Providers:  GravelBags.it This test is not yet ap proved or cleared by the [place] FDA and  has been authorized for detection and/or  diagnosis of SARS-CoV-2 by FDA under an Emergency Use Authorization (EUA). This EUA will remain  in effect (meaning this test can be used) for the  duration of the COVID-19 declaration under Section 564(b)(1) of the Act, 21 U.S.C. section 360bbb-3(b)(1), unless the authorization is terminated or revoked sooner.    Influenza A by PCR NEGATIVE NEGATIVE Final   Influenza B by PCR NEGATIVE NEGATIVE Final    Comment: (NOTE) The Xpert Xpress SARS-CoV-2/FLU/RSV assay is intended as an aid in  the diagnosis of influenza from Nasopharyngeal swab specimens and  should not be used as a sole basis for treatment. Nasal washings and  aspirates are unacceptable for Xpert Xpress SARS-CoV-2/FLU/RSV  testing. Fact Sheet for Patients: PinkCheek.be Fact Sheet for Healthcare Providers: GravelBags.it This test is not yet approved or cleared by the [place] FDA and  has been authorized for detection and/or diagnosis of SARS-CoV-2 by  FDA under an Emergency Use Authorization (EUA). This EUA will remain  in effect (meaning this test can be used) for the duration of the  Covid-19 declaration under Section 564(b)(1) of the Act, 21  U.S.C. section 360bbb-3(b)(1), unless the authorization is  terminated or revoked. Performed at [facility], [place] [address], [place], [place] [address]   MRSA PCR Screening     Status: Abnormal   Collection Time: 07/17/2019  7:35 AM   Specimen: Nasal Mucosa; Nasopharyngeal  Result Value Ref Range Status   MRSA by PCR POSITIVE (A) NEGATIVE Final    Comment:        The GeneXpert MRSA Assay (FDA approved for NASAL specimens only), is one component of a comprehensive MRSA colonization surveillance program. It is not intended to diagnose MRSA infection nor to guide or monitor treatment for MRSA infections. RESULT CALLED TO, READ BACK BY AND VERIFIED WITH: Mikal Plane RN 9:40 07/17/2019 ([email or web address]) Performed at [facility], [place] [address], [place], [place] [address]   Culture, respiratory (non-expectorated)     Status: None   Collection Time: 08/07/2019   7:38 AM   Specimen: Tracheal Aspirate; Respiratory  Result Value Ref Range Status   Specimen Description TRACHEAL ASPIRATE  Final   Special Requests NONE  Final   Gram Stain   Final    FEW WBC PRESENT, PREDOMINANTLY PMN MODERATE GRAM POSITIVE COCCI IN PAIRS IN CLUSTERS MODERATE GRAM NEGATIVE RODS RARE GRAM NEGATIVE COCCOBACILLI Performed at [facility], [place] [address] [place], [place] [address]    Culture   Final    MODERATE METHICILLIN RESISTANT STAPHYLOCOCCUS AUREUS   Report Status 08/15/2019 FINAL  Final   Organism ID, Bacteria METHICILLIN RESISTANT STAPHYLOCOCCUS AUREUS  Final      Susceptibility   Methicillin resistant staphylococcus aureus - MIC*    CIPROFLOXACIN >=8 RESISTANT Resistant     ERYTHROMYCIN >=8 RESISTANT Resistant     GENTAMICIN <=0.5 SENSITIVE Sensitive     OXACILLIN >=4 RESISTANT Resistant     TETRACYCLINE <=1 SENSITIVE Sensitive     VANCOMYCIN 1 SENSITIVE Sensitive     TRIMETH/SULFA <=10 SENSITIVE Sensitive     CLINDAMYCIN <=0.25 SENSITIVE Sensitive     RIFAMPIN <=0.5 SENSITIVE Sensitive     Inducible Clindamycin NEGATIVE Sensitive     * MODERATE METHICILLIN RESISTANT STAPHYLOCOCCUS AUREUS  Culture, blood (routine x 2)     Status: None   Collection Time: 07/28/2019  9:46 AM   Specimen: BLOOD RIGHT ARM  Result Value Ref Range Status   Specimen Description BLOOD RIGHT ARM  Final   Special Requests   Final    AEROBIC BOTTLE ONLY Blood Culture results may not be optimal due to an inadequate volume of blood received in culture bottles Performed at [facility] [address] [place], [place] [address]    Culture NO GROWTH 5 DAYS  Final   Report Status 08/17/2019 FINAL  Final  Culture, blood (routine x 2)     Status: None   Collection Time: 07/19/2019  9:47 AM   Specimen: BLOOD  Result Value Ref Range Status   Specimen Description BLOOD RIGHT ANTECUBITAL  Final   Special Requests   Final    AEROBIC BOTTLE ONLY Blood Culture results may not be  optimal due to an inadequate volume of blood received in culture bottles Performed at [facility], [place] [address] [place], [place] [address]    Culture NO GROWTH 5 DAYS  Final   Report Status 08/17/2019 FINAL  Final    Best Practice/Protocols:  VTE Prophylaxis: Heparin (drip)  Events: 1/28 Admitted. On levophed and added vasopressin  1/29: O/N Hyperkalemic, CRRT for correction also GOC discussion with family, made DNR. Still on levophed and multiple analgesics/sedatives.  0/03: Extubated, off pressors  Studies: CT Head Wo Contrast  Result Date: 07/20/2019 CLINICAL DATA:  Cerebral hemorrhage suspected, unresponsive, found by friend post witnessed arrest EXAM: CT HEAD WITHOUT CONTRAST TECHNIQUE: Contiguous axial images were obtained from the base of the skull through the vertex without intravenous contrast. COMPARISON:  None. FINDINGS: Brain: No evidence of acute infarction, hemorrhage, hydrocephalus, extra-axial collection or mass lesion/mass effect. Symmetric prominence of the ventricles, cisterns and sulci compatible with parenchymal volume loss. Patchy areas of white matter hypoattenuation are most compatible with chronic microvascular angiopathy. Extensive dural calcifications are noted along the falx and tentorium. Vascular: Extensive calcification of the carotid siphons and vertebral arteries. No unexpected hyperdense vessel is evident. Skull: No calvarial fracture or suspicious osseous lesion. No scalp swelling or hematoma. Diffuse scalp edema is seen. Benign scattered scalp calcifications. Sinuses/Orbits: Patient is intubated at the time of exam with tract transesophageal and endotracheal tubes extending below the level of imaging. Secretions in the ethmoids and posterior nasopharynx are likely related to this intubation. Minimal mucosal thickening in the maxillary sinuses. Prior lens extractions are noted. Mildly proptotic configuration of the globes which is symmetric bilaterally.  No abnormal thickening of the extraocular musculature or abnormality of the optic nerve should complexes or superior ophthalmic veins. Other: None IMPRESSION: 1. No CT evidence of acute intracranial process. 2. Chronic microvascular ischemic disease and parenchymal volume loss. 3. Diffuse soft tissue edema.  Correlate with hydration status. 4. Extensive intracranial atherosclerosis. 5. Secretions in the ethmoids and posterior nasopharynx are likely to intubation. 6. Mildly proptotic configuration of the globes which is symmetric bilaterally. Electronically Signed   By: Lovena Le M.D.   On: 07/22/2019 02:43   DG Chest Port 1 View  Result Date: 08/17/2019 CLINICAL DATA:  Recent cardiac arrest, check endotracheal tube placement EXAM: PORTABLE CHEST 1 VIEW COMPARISON:  08/15/2018 FINDINGS: Endotracheal tube and gastric catheter are noted in satisfactory position. Femoral dialysis catheter is noted in satisfactory position. Mild atelectatic changes are noted in the left base. No effusion is seen. The right subclavian venous stent and brachial venous stent are noted. IMPRESSION: Mild left basilar atelectasis. Tubes and lines as described Electronically Signed   By: Inez Catalina M.D.   On: 08/17/2019 02:21   DG Chest Port 1 View  Result Date:  08/16/2019 CLINICAL DATA:  Respiratory failure. EXAM: PORTABLE CHEST 1 VIEW COMPARISON:  02/20/2018 FINDINGS: The endotracheal tube is 5.5 cm above the carina. The NG tube is coursing down the esophagus and into the stomach. A central venous catheter is noted in the right atrium coming up from the IVC. Vascular stents are noted. Aortic calcifications are stable. The heart is normal in size. Streaky left basilar atelectasis but overall improved lung aeration since prior study. IMPRESSION: 1. Stable support apparatus. 2. Improved lung aeration with residual streaky left basilar atelectasis. Electronically Signed   By: Marijo Sanes M.D.   On: 08/16/2019 07:07   DG CHEST PORT  1 VIEW  Result Date: 07/27/2019 CLINICAL DATA:  Endotracheal tube placement. EXAM: PORTABLE CHEST 1 VIEW COMPARISON:  Earlier same day FINDINGS: Endotracheal tube tip 5.5 cm from the carina. Orogastric tube enters the abdomen. Femoral venous catheter tip in the right atrium. Slight worsening of density in the mid and lower lungs left more than right could be edema, pneumonia or atelectasis. Upper lungs remain clear. IMPRESSION: Endotracheal tube tip 5.5 cm above the carina. Worsened bilateral lower lung density left more than right could be edema, pneumonia or atelectasis. Electronically Signed   By: Nelson Chimes M.D.   On: 08/11/2019 14:32   DG Chest Port 1 View  Result Date: 08/13/2019 CLINICAL DATA:  Post intubation EXAM: PORTABLE CHEST 1 VIEW COMPARISON:  Radiograph February 20, 2018 FINDINGS: Endotracheal tube terminates in the mid trachea, 5 cm from the carina. Transesophageal tube tip and side port distal to the GE junction, terminating below the level of imaging. Telemetry leads and pacer pads overlie the chest. Some bandlike opacity in the left mid lung likely reflect subsegmental atelectasis. No consolidation, features of edema, pneumothorax, or effusion. The cardiomediastinal contours are unremarkable. No acute osseous or soft tissue abnormality. IMPRESSION: Lines and tubes as above. Subsegmental atelectasis in the left mid lung. No other acute cardiopulmonary abnormality. Electronically Signed   By: Lovena Le M.D.   On: 07/18/2019 01:59   DG Abd Portable 1V  Result Date: 08/18/2019 CLINICAL DATA:  OG tube placement EXAM: PORTABLE ABDOMEN - 1 VIEW COMPARISON:  08/16/2019 FINDINGS: The tip of the OG tube projects over the gastric body. There is a femoral approach dialysis catheter that appears well position. The stomach is distended. The bowel gas pattern is otherwise nonspecific. IMPRESSION: Enteric tube terminates over the stomach. Electronically Signed   By: Constance Holster M.D.   On:  08/18/2019 00:21   DG Abd Portable 1V  Result Date: 08/16/2019 CLINICAL DATA:  Enteric tube placement EXAM: PORTABLE ABDOMEN - 1 VIEW COMPARISON:  None. FINDINGS: Enteric tube terminates within the body of the stomach. Femoral approach central line tip overlies the right atrium. Visualized bowel gas pattern is unremarkable. IMPRESSION: Enteric tube terminates within the stomach. Electronically Signed   By: Macy Mis M.D.   On: 08/16/2019 16:34   ECHOCARDIOGRAM COMPLETE  Result Date: 08/13/2019   ECHOCARDIOGRAM REPORT   Patient Name:   ALOK MEZZANOTTE Date of Exam: 08/13/2019 Medical Rec #:  [id number]         Height:       69.0 in Accession #:    [id number]        Weight:       193.8 lb Date of Birth:  May 10, 1966         BSA:          2.04 m Patient Age:    70 years  BP:           92/66 mmHg Patient Gender: M                 HR:           97 bpm. Exam Location:  Inpatient Procedure: 2D Echo, Cardiac Doppler and Color Doppler Indications:    Cardiac Arrest 427.5  History:        Patient has no prior history of Echocardiogram examinations.  Sonographer:    Jonelle Sidle Dance Referring Phys: [id number] PAULA B SIMPSON  Sonographer Comments: Echo performed with patient supine and on artificial respirator. Image acquisition challenging due to respiratory motion. IMPRESSIONS  1. Left ventricular ejection fraction, by visual estimation, is 30 to 35%. The left ventricle has moderately decreased function. There is mildly increased left ventricular hypertrophy.  2. Left ventricular diastolic parameters are consistent with Grade I diastolic dysfunction (impaired relaxation).  3. The left ventricle demonstrates global hypokinesis.  4. Global right ventricle has mildly reduced systolic function.The right ventricular size is normal. No increase in right ventricular wall thickness.  5. Left atrial size was normal.  6. Right atrial size was normal.  7. The pericardial effusion is circumferential.  8. Trivial pericardial  effusion is present.  9. The mitral valve is normal in structure. No evidence of mitral valve regurgitation. 10. The tricuspid valve is normal in structure. 11. The tricuspid valve is normal in structure. Tricuspid valve regurgitation is trivial. 12. The aortic valve is normal in structure. Aortic valve regurgitation is not visualized. 13. The pulmonic valve was grossly normal. Pulmonic valve regurgitation is not visualized. 14. Normal pulmonary artery systolic pressure. 15. The tricuspid regurgitant velocity is 2.32 m/s, and with an assumed right atrial pressure of 3 mmHg, the estimated right ventricular systolic pressure is normal at 24.5 mmHg. 16. The inferior vena cava is normal in size with greater than 50% respiratory variability, suggesting right atrial pressure of 3 mmHg. 17. No significant change from prior study. 18. Prior images reviewed side by side. FINDINGS  Left Ventricle: Left ventricular ejection fraction, by visual estimation, is 30 to 35%. The left ventricle has moderately decreased function. The left ventricle demonstrates global hypokinesis. The left ventricular internal cavity size was the left ventricle is normal in size. There is mildly increased left ventricular hypertrophy. Concentric left ventricular hypertrophy. Left ventricular diastolic parameters are consistent with Grade I diastolic dysfunction (impaired relaxation). Right Ventricle: The right ventricular size is normal. No increase in right ventricular wall thickness. Global RV systolic function is has mildly reduced systolic function. The tricuspid regurgitant velocity is 2.32 m/s, and with an assumed right atrial pressure of 3 mmHg, the estimated right ventricular systolic pressure is normal at 24.5 mmHg. Left Atrium: Left atrial size was normal in size. Right Atrium: Right atrial size was normal in size Pericardium: Trivial pericardial effusion is present. The pericardial effusion is circumferential. Mitral Valve: The mitral valve  is normal in structure. No evidence of mitral valve regurgitation. Tricuspid Valve: The tricuspid valve is normal in structure. Tricuspid valve regurgitation is trivial. Aortic Valve: The aortic valve is normal in structure. Aortic valve regurgitation is not visualized. Pulmonic Valve: The pulmonic valve was grossly normal. Pulmonic valve regurgitation is not visualized. Pulmonic regurgitation is not visualized. Aorta: The aortic root and ascending aorta are structurally normal, with no evidence of dilitation. Venous: The inferior vena cava is normal in size with greater than 50% respiratory variability, suggesting right atrial pressure of 3 mmHg. IAS/Shunts: No  atrial level shunt detected by color flow Doppler. Additional Comments: Little change from 06/08/2019 (note different MRN).  LEFT VENTRICLE PLAX 2D LVIDd:         4.20 cm  Diastology LVIDs:         3.90 cm  LV e' lateral:   5.78 cm/s LV PW:         1.30 cm  LV E/e' lateral: 7.5 LV IVS:        1.20 cm  LV e' medial:    5.78 cm/s LVOT diam:     2.10 cm  LV E/e' medial:  7.5 LV SV:         13 ml LV SV Index:   6.06 LVOT Area:     3.46 cm  RIGHT VENTRICLE            IVC RV Basal diam:  2.10 cm    IVC diam: 1.40 cm RV S prime:     8.24 cm/s TAPSE (M-mode): 1.6 cm LEFT ATRIUM           Index       RIGHT ATRIUM          Index LA diam:      2.70 cm 1.32 cm/m  RA Area:     9.52 cm LA Vol (A4C): 26.9 ml 13.20 ml/m RA Volume:   18.20 ml 8.93 ml/m  AORTIC VALVE LVOT Vmax:   80.20 cm/s LVOT Vmean:  53.900 cm/s LVOT VTI:    0.126 m  AORTA Ao Root diam: 3.50 cm Ao Asc diam:  2.50 cm MITRAL VALVE                        TRICUSPID VALVE MV Area (PHT): 3.53 cm             TR Peak grad:   21.5 mmHg MV PHT:        62.35 msec           TR Vmax:        232.00 cm/s MV Decel Time: 215 msec MV E velocity: 43.30 cm/s 103 cm/s  SHUNTS MV A velocity: 59.60 cm/s 70.3 cm/s Systemic VTI:  0.13 m MV E/A ratio:  0.73       1.5       Systemic Diam: 2.10 cm  Mihai Croitoru MD  Electronically signed by Sanda Klein MD Signature Date/Time: 08/13/2019/2:38:10 PM    Final     Consults: Treatment Team:  Claudia Desanctis, MD Roney Jaffe, MD   Subjective:    Overnight Issues: Extubated 08/18/2019 at 2:12 AM.  Objective:  Vital signs for last 24 hours: Temp:  [97.5 F (36.4 C)-98.3 F (36.8 C)] 97.5 F (36.4 C) (02/03 0009) Pulse Rate:  [82-100] 89 (02/03 0013) Resp:  [10-26] 15 (02/03 0700) BP: (72-134)/(48-86) 84/66 (02/03 0700) SpO2:  [83 %-100 %] 100 % (02/03 0700) FiO2 (%):  [30 %] 30 % (02/03 0013)  Intake/Output from previous day: 02/02 0701 - 02/03 0700 In: 2308.5 [I.V.:866.9; Blood:630; NG/GT:650; IV Piggyback:161.7] Out: 798   Intake/Output this shift: No intake/output data recorded.  Vent settings for last 24 hours: Vent Mode: PRVC FiO2 (%):  [30 %] 30 % Set Rate:  [16 bmp] 16 bmp Vt Set:  [560 mL] 560 mL PEEP:  [5 cmH20] 5 cmH20 Pressure Support:  [5 cmH20] 5 cmH20 Plateau Pressure:  [17 cmH20] 17 cmH20  Physical Exam:  General: Awake, alert Neuro: Sleepy but easily  aroused and following voice commands, no focal deficits, cranial nerves II-XII grossly intact HEENT: Normocephalic, atraumatic, EOMI, PERRLA, extubated CVS: RRR, clear S1-S2 present, no murmurs or gallops appreciated Pulm: Bilateral lower lobe rhonchi appreciated, no wheezing, patient moving air well Abdomen: Soft, nondistended, normal bowel sounds appreciated, nontender to palpation MSK: Left BKA, calciphylaxis appreciated to anterior lateral left thigh which extends to patient's groin, purulent drainage or bleeding appreciated, wound dressing in place; right lower extremity with 2+ pitting edema  Assessment/Plan:   Cardiac Arrest in Setting of HFrEF: Prior TTE 05/2019 with LVEF 25-30%, LV global hypokinesis, mild LV hypertrophy, mildly reduced RV function, Indeterminate diastolic filling due to E-A fusion. - Hypothermia protocol was deferred given his intact  neurological status - Following telemetry for any ectopy, replete electrolytes as indicated - Frequent neurological evaluation  Prolonged QTc, resolved: 427 on 01/31 - Continue telemetry monitoring - Follow ECG  Multifactorial Shock, likely cardiogenic with superimposed septic shock, improved: Patient stable off of pressors.  Blood cultures from 1/28 negative, completed Zosyn course. - Continue vancomycin for MRSA pneumonia, plan 8-day course (01/29 - 02/05) - Continue stress dose steroids - Solu-Cortef 50mg  IV q6 - Will likely need to defer volume removal given his pressor needs  Acute Hypoxic Respiratory Failure, improving:due topulmonary edema and history of HFrEF, MRSA pneumonia, encephalopathy (improved) -No longer intubated or mechanically ventilated - Volume removal when he can tolerate hemodynamically - Continuous pulse ox monitoring  Acute Encephalopathy, improving: Patient is s/p cardiac arrest with seizure-like activity treated with midazolam, now resolved. - Frequent neurochecks, continue to monitor mental status - Begin to wean sedating medications as he can tolerate. He does have significant pain from his calciphylaxis  Calciphylaxis, chronic, stable:  Patient denies significant pain from his calciphylaxis this morning and 2/3.  Adequate pain control with Dilaudid, gabapentin. - Continue gabapentin 200mg  BID - Clonazepam 1mg  BID - Dilaudid 4mg  q6 - Continue Sodium Thiosulfate 25mg  IV qMWF  ESRD on HD MWF: now on CRRT while hospitalized - Nephrology consulted - appreciate recommendations, plan to stop CRRT and initiate HD 2/4 pending verified access and patient stability - Heparin w/ CRRT  Anemia of Chronic Disease, chronic, stable:Hgb 7.1>10.5 s/p 2u RBC 2/02. Also transfused 2 units RBC 01/28-01/29. - AM CBC improved - Transfuse Hgb <7 with RBC.  - Aranesp IV 122mcg every Saturday  Type 2 Diabetes Mellitus:CBG 72 - 278 - mSSI  - Levemir 15 units  BID - CBGs q4   LOS: 6 days    Milus Banister, [place], PGY-2 08/18/2019 8:40 AM

## 2019-08-18 NOTE — Progress Notes (Signed)
Patient attempted to self-extubate. RN redirected patient away from Endotracheal tube. RN verified endotracheal tube was not pulled out. However, ventilator alarmed continuously. RN called respiratory therapist to assess endotracheal tube. Patient did remove OG tube. RN replaced OG tube and ordered abdominal xray to verify placement.

## 2019-08-18 NOTE — Progress Notes (Signed)
PT Cancellation Note  Patient Details Name: ANTRONE ZEH MRN: [id number] DOB: 01-13-1966   Cancelled Treatment:    Reason Eval/Treat Not Completed: Medical issues which prohibited therapy(Nsg states lines will be removed and off CRRT tomorrow. )   Godfrey Pick Annaliese Saez 08/18/2019, 2:32 PM Zailah Zagami W,PT [place] Pager:  [phone]  Office:  [phone]

## 2019-08-18 NOTE — Progress Notes (Signed)
OT Cancellation Note  Patient Details Name: ZAYIN CLENDENNING MRN: [id number] DOB: 1966-06-16   Cancelled Treatment:    Reason Eval/Treat Not Completed: Medical issues which prohibited therapy(Pt to be off CRRT and line removed tomorrow.)  Malka So 08/18/2019, 3:33 PM  Nestor Lewandowsky, OTR/L Acute Rehabilitation Services Pager: [phone] Office: [phone]

## 2019-08-18 NOTE — Progress Notes (Signed)
Pharmacy Antibiotic Note  [name] is a 54 y.o. male  with MRSA PNA  Pharmacy has been consulted for Vancomycin dosing (day 7; plans noted for 8 days). S/P brief cardiac arrest. WBC elevated. ESRD on HD MWF, now on CRRT -WBC= 14.5, afebrile  Plan Continue Vancomycin 750 mg IV q 24 hrs while on CRRT Planning 8 days on vancomycin    Temp (24hrs), Avg:98 F (36.7 C), Min:97.5 F (36.4 C), Max:98.3 F (36.8 C)  Recent Labs  Lab 08/13/19 0357 08/13/19 1439 08/13/19 1500 08/15/19 1131 08/15/19 1439 08/16/19 0258 08/16/19 0258 08/16/19 1645 08/17/19 0430 08/17/19 0732 08/17/19 1600 08/17/19 1700 08/18/19 0341 08/18/19 0615  WBC 17.9*  --   --   --    < > 14.4*  --   --   --  13.8*  --  19.4* 17.2* 14.4*  CREATININE 8.08*  --    < >  --    < > 2.73*   < > 2.12* 2.29*  --  2.43*  --  1.57* 2.51*  LATICACIDVEN 3.5* 1.8  --   --   --   --   --   --   --   --   --   --   --   --   VANCOTROUGH  --   --   --  17  --   --   --   --   --   --   --   --   --   --    < > = values in this interval not displayed.    Estimated Creatinine Clearance: 37.5 mL/min (A) (by C-G formula based on SCr of 2.51 mg/dL (H)).    Allergies  Allergen Reactions  . Amlodipine Itching  . Dacarbazine And Related Nausea And Vomiting  . Fentanyl Itching  . Hydrocodone Nausea And Vomiting  . Propofol Itching   Antimicrobials this admission:  Vancomycin 1/28 >  (planning 8 days) Zosyn 1/28 > 1/31  Dose adjustments this admission:  1/31 VT (on CRRT) = 17- cont 750 q 24  Microbiology results:  1/28 MRSA + 1/28 BCx x 2: NGTD 1/28 Aspirate cx: moderate MRSA  1/28 Resp panel neg  Hildred Laser, PharmD Clinical Pharmacist **Pharmacist phone directory can now be found on [email or web address] ([email or web address]).  Listed under [facility].

## 2019-08-19 ENCOUNTER — Other Ambulatory Visit: Payer: Self-pay

## 2019-08-19 ENCOUNTER — Inpatient Hospital Stay (HOSPITAL_COMMUNITY): Payer: Medicare Other

## 2019-08-19 ENCOUNTER — Encounter (HOSPITAL_COMMUNITY): Payer: Self-pay | Admitting: Critical Care Medicine

## 2019-08-19 HISTORY — PX: IR US GUIDE VASC ACCESS LEFT: IMG2389

## 2019-08-19 HISTORY — PX: IR FLUORO GUIDE CV LINE LEFT: IMG2282

## 2019-08-19 LAB — BASIC METABOLIC PANEL
Anion gap: 20 — ABNORMAL HIGH (ref 5–15)
BUN: 50 mg/dL — ABNORMAL HIGH (ref 6–20)
CO2: 17 mmol/L — ABNORMAL LOW (ref 22–32)
Calcium: 7.5 mg/dL — ABNORMAL LOW (ref 8.9–10.3)
Chloride: 102 mmol/L (ref 98–111)
Creatinine, Ser: 3.77 mg/dL — ABNORMAL HIGH (ref 0.61–1.24)
GFR calc Af Amer: 20 mL/min — ABNORMAL LOW (ref >60)
GFR calc non Af Amer: 17 mL/min — ABNORMAL LOW (ref >60)
Glucose, Bld: 160 mg/dL — ABNORMAL HIGH (ref 70–99)
Potassium: 4 mmol/L (ref 3.5–5.1)
Sodium: 139 mmol/L (ref 135–145)

## 2019-08-19 LAB — RENAL FUNCTION PANEL
Albumin: 1.8 g/dL — ABNORMAL LOW (ref 3.5–5.0)
Albumin: 1.7 g/dL — ABNORMAL LOW (ref 3.5–5.0)
Anion gap: 19 — ABNORMAL HIGH (ref 5–15)
Anion gap: 19 — ABNORMAL HIGH (ref 5–15)
BUN: 40 mg/dL — ABNORMAL HIGH (ref 6–20)
BUN: 50 mg/dL — ABNORMAL HIGH (ref 6–20)
CO2: 20 mmol/L — ABNORMAL LOW (ref 22–32)
CO2: 18 mmol/L — ABNORMAL LOW (ref 22–32)
Calcium: 8 mg/dL — ABNORMAL LOW (ref 8.9–10.3)
Calcium: 7.4 mg/dL — ABNORMAL LOW (ref 8.9–10.3)
Chloride: 105 mmol/L (ref 98–111)
Chloride: 100 mmol/L (ref 98–111)
Creatinine, Ser: 2.92 mg/dL — ABNORMAL HIGH (ref 0.61–1.24)
Creatinine, Ser: 3.66 mg/dL — ABNORMAL HIGH (ref 0.61–1.24)
GFR calc Af Amer: 27 mL/min — ABNORMAL LOW (ref >60)
GFR calc Af Amer: 20 mL/min — ABNORMAL LOW (ref >60)
GFR calc non Af Amer: 23 mL/min — ABNORMAL LOW (ref >60)
GFR calc non Af Amer: 18 mL/min — ABNORMAL LOW (ref >60)
Glucose, Bld: 56 mg/dL — ABNORMAL LOW (ref 70–99)
Glucose, Bld: 168 mg/dL — ABNORMAL HIGH (ref 70–99)
Phosphorus: 3.5 mg/dL (ref 2.5–4.6)
Phosphorus: 3.9 mg/dL (ref 2.5–4.6)
Potassium: 3.9 mmol/L (ref 3.5–5.1)
Potassium: 4.3 mmol/L (ref 3.5–5.1)
Sodium: 144 mmol/L (ref 135–145)
Sodium: 137 mmol/L (ref 135–145)

## 2019-08-19 LAB — CBC
HCT: 28.6 % — ABNORMAL LOW (ref 39.0–52.0)
Hemoglobin: 8.6 g/dL — ABNORMAL LOW (ref 13.0–17.0)
MCH: 27.8 pg (ref 26.0–34.0)
MCHC: 30.1 g/dL (ref 30.0–36.0)
MCV: 92.6 fL (ref 80.0–100.0)
Platelets: 161 10*3/uL (ref 150–400)
RBC: 3.09 MIL/uL — ABNORMAL LOW (ref 4.22–5.81)
RDW: 22 % — ABNORMAL HIGH (ref 11.5–15.5)
WBC: 13.6 10*3/uL — ABNORMAL HIGH (ref 4.0–10.5)
nRBC: 1.1 % — ABNORMAL HIGH (ref 0.0–0.2)

## 2019-08-19 LAB — GLUCOSE, CAPILLARY
Glucose-Capillary: 136 mg/dL — ABNORMAL HIGH (ref 70–99)
Glucose-Capillary: 164 mg/dL — ABNORMAL HIGH (ref 70–99)
Glucose-Capillary: 18 mg/dL — CL (ref 70–99)
Glucose-Capillary: <10 mg/dL — CL (ref 70–99)

## 2019-08-19 LAB — MAGNESIUM
Magnesium: 2.6 mg/dL — ABNORMAL HIGH (ref 1.7–2.4)
Magnesium: 2.5 mg/dL — ABNORMAL HIGH (ref 1.7–2.4)

## 2019-08-19 LAB — GLUCOSE, RANDOM: Glucose, Bld: 122 mg/dL — ABNORMAL HIGH (ref 70–99)

## 2019-08-19 MED ORDER — LIDOCAINE HCL 1 % IJ SOLN
INTRAMUSCULAR | Status: AC
  Filled 2019-08-19: qty 20

## 2019-08-19 MED ORDER — MIDODRINE HCL 5 MG PO TABS
10.0000 mg | ORAL_TABLET | Freq: Three times a day (TID) | ORAL | Status: DC
  Administered 2019-08-20 – 2019-08-28 (×25): 10 mg via ORAL
  Filled 2019-08-19 (×26): qty 2

## 2019-08-19 MED ORDER — NEPRO/CARBSTEADY PO LIQD
237.0000 mL | Freq: Three times a day (TID) | ORAL | Status: DC
  Administered 2019-08-19 – 2019-08-27 (×20): 237 mL via ORAL
  Filled 2019-08-19 (×3): qty 237

## 2019-08-19 MED ORDER — PRO-STAT SUGAR FREE PO LIQD
30.0000 mL | Freq: Two times a day (BID) | ORAL | Status: DC
  Administered 2019-08-19 – 2019-08-28 (×18): 30 mL via ORAL
  Filled 2019-08-19 (×18): qty 30

## 2019-08-19 MED ORDER — RENA-VITE PO TABS
1.0000 | ORAL_TABLET | Freq: Every day | ORAL | Status: DC
  Administered 2019-08-19 – 2019-08-27 (×9): 1 via ORAL
  Filled 2019-08-19 (×9): qty 1

## 2019-08-19 MED ORDER — GLUCOSE 40 % PO GEL
1.0000 | ORAL | Status: AC
  Administered 2019-08-19: 37.5 g via ORAL
  Filled 2019-08-19: qty 1

## 2019-08-19 MED ORDER — IOHEXOL 300 MG/ML  SOLN
50.0000 mL | Freq: Once | INTRAMUSCULAR | Status: AC | PRN
  Administered 2019-08-19: 12 mL via INTRAVENOUS

## 2019-08-19 MED ORDER — CHLORHEXIDINE GLUCONATE CLOTH 2 % EX PADS
6.0000 | MEDICATED_PAD | Freq: Every day | CUTANEOUS | Status: DC
  Administered 2019-08-20 – 2019-08-21 (×3): 6 via TOPICAL

## 2019-08-19 MED ORDER — LIDOCAINE HCL (PF) 1 % IJ SOLN
INTRAMUSCULAR | Status: DC | PRN
  Administered 2019-08-19: 10 mL

## 2019-08-19 NOTE — Progress Notes (Signed)
[facility] Progress Note Patient Name: [name] DOB: 1966-04-10 MRN: [id number]   Date of Service  08/19/2019  HPI/Events of Note  NSVT - K+ = 4.0 and Mg++ = 2.5.   eICU Interventions  Continue to monitor heart rhythm.     Intervention Category Major Interventions: Arrhythmia - evaluation and management  Sommer,Steven Cornelia Copa 08/19/2019, 11:39 PM

## 2019-08-19 NOTE — Progress Notes (Signed)
Results for DEMONTRAY, DAME (MRN [id number]) as of 08/19/2019 09:50  Ref. Range 08/18/2019 03:37 08/18/2019 15:42 08/18/2019 17:02 08/18/2019 19:46 08/19/2019 08:10  Glucose-Capillary Latest Ref Range: 70 - 99 mg/dL 204 (H) 60 (L) <10 (LL) 50 (L) <10 (LL)  Noted that patient had a low blood sugar at 1700 last night of <10 mg/dl per CBG machine. A lab glucose was drawn and came back as 386 mg/dl. This lab glucose was treated with Novolog 15 units at [place]. That seems to be why patient's am CBG is 10 mg/dl.   Recommend changing Novolog correction scale to SENSITIVE every 6 hours since patient has been running less than 100 mg/dl recently. Will continue to monitor blood sugars while in the hospital.  Harvel Ricks RN BSN CDE Diabetes Coordinator Pager: [phone]  8am-5pm

## 2019-08-19 NOTE — Consult Note (Signed)
[facility] Nurse Consult Note: Patient receiving care in 2H22.  Assisted with turning patient by primary RN. 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  Wound area extensively impacting the LLE, sacrum, left buttock.  Sacral and buttock wounds are not pressure injuries but related to calciphylaxis Pressure Injury POA: Yes/No/NA Measurement: deferred Wound bed: The sacrum is maroonish in color with the top layer of skin peeling off. The left buttock is becoming black and hardened. Most of the LLE is blackened, hard, with scattered areas of peeling. Drainage (amount, consistency, odor) some serosanginous Periwound: fragile, peeling, discolored Dressing procedure/placement/frequency: Place Mepitel Kellie Simmering [phone]) over sacral and left buttock wound areas. Top with size appropriate foam dressings.  The Mepitel can stay in place up to 7 days and the foam dressings can remain in place up to 3 days.  For all other LLE wound, Mepitel to cover, can be left in place up to 7 days, topped with ABD pads, secured with kerlex.  ABD and Kerlex needs to be changed daily and prn. Monitor the wound area(s) for worsening of condition such as: Signs/symptoms of infection,  Increase in size,  Development of or worsening of odor, Development of pain, or increased pain at the affected locations.  Notify the medical team if any of these develop.  Thank you for the consult.  Discussed plan of care with the bedside nurse.  [place] nurse will not follow at this time.  Please re-consult the [facility] team if needed.  Val Riles, RN, MSN, CWOCN, CNS-BC, pager [phone]

## 2019-08-19 NOTE — Progress Notes (Signed)
eLink Physician-Brief Progress Note Patient Name: [name] DOB: Aug 04, 1965 MRN: [id number]   Date of Service  08/19/2019  HPI/Events of Note  NSVT - 30 second run.  eICU Interventions  Will order: 1. BMP and Mg++ level STAT.      Intervention Category Major Interventions: Arrhythmia - evaluation and management  Ysenia Filice Eugene 08/19/2019, 10:25 PM

## 2019-08-19 NOTE — Progress Notes (Signed)
OT Cancellation Note  Patient Details Name: GROVER HUNEKE MRN: [id number] DOB: [birth year]   Cancelled Treatment:    Reason Eval/Treat Not Completed: Patient at procedure or test/ unavailable. Pt in IR.  Malka So 08/19/2019, 3:57 PM  Nestor Lewandowsky, OTR/L Acute Rehabilitation Services Pager: [phone] Office: [phone]

## 2019-08-19 NOTE — Progress Notes (Addendum)
Patient Status: [facility] - In-pt  Assessment and Plan: Patient in need of venous access.  Currently has a triple lumen catheter in the left groin. Per RN, able to infuse but not aspirate.  Patient is a hard stick with frequent med and labs needs. Tunneled HD catheter in place on the right currently used for dialysis.  Patient requiring pressure support, IV abx, frequent labs draw. Request for new central access if possible.  R and L IJ access attempted at bedside earlier and unsuccessful which is not unexpected given his vascular disease.  IR consulted for vascular access.   Risks and benefits discussed with the patient including, but not limited to bleeding, infection, vascular injury, pneumothorax which may require chest tube placement, air embolism or even death  All of the patient's questions were answered, patient is agreeable to proceed. Consent signed and in chart.  ______________________________________________________________________   History of Present Illness: [name] is a 54 y.o. male admitted with septic shock requiring intubation and vasopressors.  Patient with history of poor venous access.  He currently received dialysis via R femoral tunneled HD catheter.   He is assessed at bedside today. Found to be alert, oriented and able to participate in care.   Allergies and medications reviewed.   Review of Systems: A 12 point ROS discussed and pertinent positives are indicated in the HPI above.  All other systems are negative.  Review of Systems  Constitutional: Negative for fatigue and fever.  Respiratory: Negative for cough and shortness of breath.   Cardiovascular: Negative for chest pain.  Gastrointestinal: Negative for abdominal pain.  Musculoskeletal: Negative for back pain.  Psychiatric/Behavioral: Negative for behavioral problems and confusion.    Vital Signs: BP (!) 84/65   Pulse 94   Temp (!) 97 F (36.1 C) (Oral)   Resp 18   Ht 5\' 9"  (1.753 m)    Wt 202 lb 6.1 oz (91.8 kg)   SpO2 99%   BMI 29.89 kg/m   Physical Exam Vitals and nursing note reviewed.  Constitutional:      General: He is not in acute distress.    Appearance: Normal appearance. He is ill-appearing.  Cardiovascular:     Rate and Rhythm: Normal rate and regular rhythm.  Pulmonary:     Effort: Pulmonary effort is normal. No respiratory distress.     Breath sounds: Normal breath sounds.  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  Neurological:     General: No focal deficit present.     Mental Status: He is alert and oriented to person, place, and time. Mental status is at baseline.      Imaging reviewed.   Labs:  COAGS: Recent Labs    08/11/2019 1610 08/13/19 0357 08/16/19 0258 08/17/19 0430 08/18/19 0341 08/18/19 0615  INR 1.4*  --   --   --   --   --   APTT  --    < > 180* 37* >200* 48*   < > = values in this interval not displayed.    BMP: Recent Labs    08/18/19 0341 08/18/19 0615 08/18/19 1817 08/19/19 0500  NA 138 139 142 144  K 2.3* 3.3* 3.5 3.9  CL 100 101 108 105  CO2 25 23 18* 20*  GLUCOSE 212* 187* 386* 56*  BUN 26* 38* 28* 40*  CALCIUM 8.0* 8.0* 6.9* 8.0*  CREATININE 1.57* 2.51* 1.84* 2.92*  GFRNONAA 49* 28* 40* 23*  GFRAA 57* 32* 47* 27*       Electronically Signed: Docia Barrier, PA 08/19/2019, 3:47 PM   I spent a total of 15 minutes in face to face in clinical consultation, greater than 50% of which was counseling/coordinating care for venous access.

## 2019-08-19 NOTE — Progress Notes (Signed)
NAME:  WEST ANCRUM, MRN:  [id number], DOB:  1966-04-22, LOS: 7 ADMISSION DATE:  08/06/2019, CONSULTATION DATE: 07/19/2019 REFERRING MD: Dr. Roxanne Mins, CHIEF COMPLAINT: Cardiac arrest  Brief History   54 year old male with history of ESRD on HD, acute on chronic anemia, severe calciphylaxis, and type 2 diabetes.    History of present illness   Patient was found unresponsive and had witnessed cardiac arrest, obtained ROSC after 3 minutes.  Patient required intubation for ongoing airway protection and unresponsiveness.  Required vasopressor support for hypotension.  Additionally found to have MRSA pneumonia, currently being treated with vancomycin.  Patient is now extubated since 08/18/2019.  Past Medical History   Patient Active Problem List   Diagnosis Date Noted  . Acute respiratory failure ([place])   . Cardiac arrest ([place]) 07/30/2019  . End-stage renal disease on hemodialysis ([place])   . Hyperkalemia   . Anemia due to chronic kidney disease, on chronic dialysis ([place])   . Encephalopathy acute   . Calciphylaxis   -Left BKA -Anemia -Bronchitis -CHF (congestive heart failure) -ESRD on HD MWF -Type 2 diabetes -Dysrhythmia -GERD -Hyperparathyroidism -Morbid obesity  Significant Hospital Events   1/28 Admitted. On levophed and added vasopressin  1/29: O/N Hyperkalemic, CRRT for correction also GOC discussion with family, made DNR. Still on levophed and multiple analgesics/sedatives. 2/03: Extubated, off pressors 2/04: Attempted bedside central line placement  Consults:  Nephrology  Procedures:  2/4 - central line placement by IR  Significant Diagnostic Tests:  DG Chest 1 View  Result Date: 08/19/2019 CLINICAL DATA:  Central line placement EXAM: CHEST  1 VIEW COMPARISON:  08/17/2019 FINDINGS: Frontal view of the chest demonstrates stable cardiac silhouette. Vascular stent right subclavian distribution unchanged. Central venous catheter from an inferior approach tip overlies the  junction of the inferior vena cava and right atrium. I do not see any new central venous catheter is on this exam. Stable bibasilar scarring. No airspace disease, effusion, or pneumothorax. IMPRESSION: 1. Stable central venous catheter from an inferior approach. 2. No other central lines are identified on this exam. Electronically Signed   By: Randa Ngo M.D.   On: 08/19/2019 14:14   CT Head Wo Contrast  Result Date: 08/09/2019 CLINICAL DATA:  Cerebral hemorrhage suspected, unresponsive, found by friend post witnessed arrest EXAM: CT HEAD WITHOUT CONTRAST TECHNIQUE: Contiguous axial images were obtained from the base of the skull through the vertex without intravenous contrast. COMPARISON:  None. FINDINGS: Brain: No evidence of acute infarction, hemorrhage, hydrocephalus, extra-axial collection or mass lesion/mass effect. Symmetric prominence of the ventricles, cisterns and sulci compatible with parenchymal volume loss. Patchy areas of white matter hypoattenuation are most compatible with chronic microvascular angiopathy. Extensive dural calcifications are noted along the falx and tentorium. Vascular: Extensive calcification of the carotid siphons and vertebral arteries. No unexpected hyperdense vessel is evident. Skull: No calvarial fracture or suspicious osseous lesion. No scalp swelling or hematoma. Diffuse scalp edema is seen. Benign scattered scalp calcifications. Sinuses/Orbits: Patient is intubated at the time of exam with tract transesophageal and endotracheal tubes extending below the level of imaging. Secretions in the ethmoids and posterior nasopharynx are likely related to this intubation. Minimal mucosal thickening in the maxillary sinuses. Prior lens extractions are noted. Mildly proptotic configuration of the globes which is symmetric bilaterally. No abnormal thickening of the extraocular musculature or abnormality of the optic nerve should complexes or superior ophthalmic veins. Other: None  IMPRESSION: 1. No CT evidence of acute intracranial process. 2. Chronic microvascular ischemic disease  and parenchymal volume loss. 3. Diffuse soft tissue edema.  Correlate with hydration status. 4. Extensive intracranial atherosclerosis. 5. Secretions in the ethmoids and posterior nasopharynx are likely to intubation. 6. Mildly proptotic configuration of the globes which is symmetric bilaterally. Electronically Signed   By: Lovena Le M.D.   On: 07/18/2019 02:43   DG Chest Port 1 View  Result Date: 08/17/2019 CLINICAL DATA:  Recent cardiac arrest, check endotracheal tube placement EXAM: PORTABLE CHEST 1 VIEW COMPARISON:  08/15/2018 FINDINGS: Endotracheal tube and gastric catheter are noted in satisfactory position. Femoral dialysis catheter is noted in satisfactory position. Mild atelectatic changes are noted in the left base. No effusion is seen. The right subclavian venous stent and brachial venous stent are noted. IMPRESSION: Mild left basilar atelectasis. Tubes and lines as described Electronically Signed   By: Inez Catalina M.D.   On: 08/17/2019 02:21   DG Chest Port 1 View  Result Date: 08/16/2019 CLINICAL DATA:  Respiratory failure. EXAM: PORTABLE CHEST 1 VIEW COMPARISON:  02/20/2018 FINDINGS: The endotracheal tube is 5.5 cm above the carina. The NG tube is coursing down the esophagus and into the stomach. A central venous catheter is noted in the right atrium coming up from the IVC. Vascular stents are noted. Aortic calcifications are stable. The heart is normal in size. Streaky left basilar atelectasis but overall improved lung aeration since prior study. IMPRESSION: 1. Stable support apparatus. 2. Improved lung aeration with residual streaky left basilar atelectasis. Electronically Signed   By: Marijo Sanes M.D.   On: 08/16/2019 07:07   DG CHEST PORT 1 VIEW  Result Date: 08/03/2019 CLINICAL DATA:  Endotracheal tube placement. EXAM: PORTABLE CHEST 1 VIEW COMPARISON:  Earlier same day FINDINGS:  Endotracheal tube tip 5.5 cm from the carina. Orogastric tube enters the abdomen. Femoral venous catheter tip in the right atrium. Slight worsening of density in the mid and lower lungs left more than right could be edema, pneumonia or atelectasis. Upper lungs remain clear. IMPRESSION: Endotracheal tube tip 5.5 cm above the carina. Worsened bilateral lower lung density left more than right could be edema, pneumonia or atelectasis. Electronically Signed   By: Nelson Chimes M.D.   On: 07/31/2019 14:32   DG Chest Port 1 View  Result Date: 08/10/2019 CLINICAL DATA:  Post intubation EXAM: PORTABLE CHEST 1 VIEW COMPARISON:  Radiograph February 20, 2018 FINDINGS: Endotracheal tube terminates in the mid trachea, 5 cm from the carina. Transesophageal tube tip and side port distal to the GE junction, terminating below the level of imaging. Telemetry leads and pacer pads overlie the chest. Some bandlike opacity in the left mid lung likely reflect subsegmental atelectasis. No consolidation, features of edema, pneumothorax, or effusion. The cardiomediastinal contours are unremarkable. No acute osseous or soft tissue abnormality. IMPRESSION: Lines and tubes as above. Subsegmental atelectasis in the left mid lung. No other acute cardiopulmonary abnormality. Electronically Signed   By: Lovena Le M.D.   On: 08/10/2019 01:59   DG Abd Portable 1V  Result Date: 08/18/2019 CLINICAL DATA:  OG tube placement EXAM: PORTABLE ABDOMEN - 1 VIEW COMPARISON:  08/16/2019 FINDINGS: The tip of the OG tube projects over the gastric body. There is a femoral approach dialysis catheter that appears well position. The stomach is distended. The bowel gas pattern is otherwise nonspecific. IMPRESSION: Enteric tube terminates over the stomach. Electronically Signed   By: Constance Holster M.D.   On: 08/18/2019 00:21   DG Abd Portable 1V  Result Date: 08/16/2019 CLINICAL DATA:  Enteric tube placement EXAM: PORTABLE ABDOMEN - 1 VIEW COMPARISON:   None. FINDINGS: Enteric tube terminates within the body of the stomach. Femoral approach central line tip overlies the right atrium. Visualized bowel gas pattern is unremarkable. IMPRESSION: Enteric tube terminates within the stomach. Electronically Signed   By: Macy Mis M.D.   On: 08/16/2019 16:34   ECHOCARDIOGRAM COMPLETE  Result Date: 08/13/2019   ECHOCARDIOGRAM REPORT   Patient Name:   MURILO PATTISON Date of Exam: 08/13/2019 Medical Rec #:  [id number]         Height:       69.0 in Accession #:    [id number]        Weight:       193.8 lb Date of Birth:  29-Sep-1965         BSA:          2.04 m Patient Age:    11 years          BP:           92/66 mmHg Patient Gender: M                 HR:           97 bpm. Exam Location:  Inpatient Procedure: 2D Echo, Cardiac Doppler and Color Doppler Indications:    Cardiac Arrest 427.5  History:        Patient has no prior history of Echocardiogram examinations.  Sonographer:    Jonelle Sidle Dance Referring Phys: [id number] PAULA B SIMPSON  Sonographer Comments: Echo performed with patient supine and on artificial respirator. Image acquisition challenging due to respiratory motion. IMPRESSIONS  1. Left ventricular ejection fraction, by visual estimation, is 30 to 35%. The left ventricle has moderately decreased function. There is mildly increased left ventricular hypertrophy.  2. Left ventricular diastolic parameters are consistent with Grade I diastolic dysfunction (impaired relaxation).  3. The left ventricle demonstrates global hypokinesis.  4. Global right ventricle has mildly reduced systolic function.The right ventricular size is normal. No increase in right ventricular wall thickness.  5. Left atrial size was normal.  6. Right atrial size was normal.  7. The pericardial effusion is circumferential.  8. Trivial pericardial effusion is present.  9. The mitral valve is normal in structure. No evidence of mitral valve regurgitation. 10. The tricuspid valve is normal in  structure. 11. The tricuspid valve is normal in structure. Tricuspid valve regurgitation is trivial. 12. The aortic valve is normal in structure. Aortic valve regurgitation is not visualized. 13. The pulmonic valve was grossly normal. Pulmonic valve regurgitation is not visualized. 14. Normal pulmonary artery systolic pressure. 15. The tricuspid regurgitant velocity is 2.32 m/s, and with an assumed right atrial pressure of 3 mmHg, the estimated right ventricular systolic pressure is normal at 24.5 mmHg. 16. The inferior vena cava is normal in size with greater than 50% respiratory variability, suggesting right atrial pressure of 3 mmHg. 17. No significant change from prior study. 18. Prior images reviewed side by side. FINDINGS  Left Ventricle: Left ventricular ejection fraction, by visual estimation, is 30 to 35%. The left ventricle has moderately decreased function. The left ventricle demonstrates global hypokinesis. The left ventricular internal cavity size was the left ventricle is normal in size. There is mildly increased left ventricular hypertrophy. Concentric left ventricular hypertrophy. Left ventricular diastolic parameters are consistent with Grade I diastolic dysfunction (impaired relaxation). Right Ventricle: The right ventricular size is normal. No increase in right ventricular wall thickness. Global  RV systolic function is has mildly reduced systolic function. The tricuspid regurgitant velocity is 2.32 m/s, and with an assumed right atrial pressure of 3 mmHg, the estimated right ventricular systolic pressure is normal at 24.5 mmHg. Left Atrium: Left atrial size was normal in size. Right Atrium: Right atrial size was normal in size Pericardium: Trivial pericardial effusion is present. The pericardial effusion is circumferential. Mitral Valve: The mitral valve is normal in structure. No evidence of mitral valve regurgitation. Tricuspid Valve: The tricuspid valve is normal in structure. Tricuspid valve  regurgitation is trivial. Aortic Valve: The aortic valve is normal in structure. Aortic valve regurgitation is not visualized. Pulmonic Valve: The pulmonic valve was grossly normal. Pulmonic valve regurgitation is not visualized. Pulmonic regurgitation is not visualized. Aorta: The aortic root and ascending aorta are structurally normal, with no evidence of dilitation. Venous: The inferior vena cava is normal in size with greater than 50% respiratory variability, suggesting right atrial pressure of 3 mmHg. IAS/Shunts: No atrial level shunt detected by color flow Doppler. Additional Comments: Little change from 06/08/2019 (note different MRN).  LEFT VENTRICLE PLAX 2D LVIDd:         4.20 cm  Diastology LVIDs:         3.90 cm  LV e' lateral:   5.78 cm/s LV PW:         1.30 cm  LV E/e' lateral: 7.5 LV IVS:        1.20 cm  LV e' medial:    5.78 cm/s LVOT diam:     2.10 cm  LV E/e' medial:  7.5 LV SV:         13 ml LV SV Index:   6.06 LVOT Area:     3.46 cm  RIGHT VENTRICLE            IVC RV Basal diam:  2.10 cm    IVC diam: 1.40 cm RV S prime:     8.24 cm/s TAPSE (M-mode): 1.6 cm LEFT ATRIUM           Index       RIGHT ATRIUM          Index LA diam:      2.70 cm 1.32 cm/m  RA Area:     9.52 cm LA Vol (A4C): 26.9 ml 13.20 ml/m RA Volume:   18.20 ml 8.93 ml/m  AORTIC VALVE LVOT Vmax:   80.20 cm/s LVOT Vmean:  53.900 cm/s LVOT VTI:    0.126 m  AORTA Ao Root diam: 3.50 cm Ao Asc diam:  2.50 cm MITRAL VALVE                        TRICUSPID VALVE MV Area (PHT): 3.53 cm             TR Peak grad:   21.5 mmHg MV PHT:        62.35 msec           TR Vmax:        232.00 cm/s MV Decel Time: 215 msec MV E velocity: 43.30 cm/s 103 cm/s  SHUNTS MV A velocity: 59.60 cm/s 70.3 cm/s Systemic VTI:  0.13 m MV E/A ratio:  0.73       1.5       Systemic Diam: 2.10 cm  Dani Gobble Croitoru MD Electronically signed by Sanda Klein MD Signature Date/Time: 08/13/2019/2:38:10 PM    Final    [place] EKG SITE RITE  Result Date:  08/18/2019 If Site Rite  image not attached, placement could not be confirmed due to current cardiac rhythm.    Micro Data:   Results for orders placed or performed during the hospital encounter of 07/26/2019  Respiratory Panel by RT PCR (Flu A&B, Covid) - Nasopharyngeal Swab     Status: None   Collection Time: 08/01/2019  1:46 AM   Specimen: Nasopharyngeal Swab  Result Value Ref Range Status   SARS Coronavirus 2 by RT PCR NEGATIVE NEGATIVE Final    Comment: (NOTE) SARS-CoV-2 target nucleic acids are NOT DETECTED. The SARS-CoV-2 RNA is generally detectable in upper respiratoy specimens during the acute phase of infection. The lowest concentration of SARS-CoV-2 viral copies this assay can detect is 131 copies/mL. A negative result does not preclude SARS-Cov-2 infection and should not be used as the sole basis for treatment or other patient management decisions. A negative result may occur with  improper specimen collection/handling, submission of specimen other than nasopharyngeal swab, presence of viral mutation(s) within the areas targeted by this assay, and inadequate number of viral copies (<131 copies/mL). A negative result must be combined with clinical observations, patient history, and epidemiological information. The expected result is Negative. Fact Sheet for Patients:  PinkCheek.be Fact Sheet for Healthcare Providers:  GravelBags.it This test is not yet ap proved or cleared by the [place] FDA and  has been authorized for detection and/or diagnosis of SARS-CoV-2 by FDA under an Emergency Use Authorization (EUA). This EUA will remain  in effect (meaning this test can be used) for the duration of the COVID-19 declaration under Section 564(b)(1) of the Act, 21 U.S.C. section 360bbb-3(b)(1), unless the authorization is terminated or revoked sooner.    Influenza A by PCR NEGATIVE NEGATIVE Final   Influenza B by PCR NEGATIVE NEGATIVE Final      Comment: (NOTE) The Xpert Xpress SARS-CoV-2/FLU/RSV assay is intended as an aid in  the diagnosis of influenza from Nasopharyngeal swab specimens and  should not be used as a sole basis for treatment. Nasal washings and  aspirates are unacceptable for Xpert Xpress SARS-CoV-2/FLU/RSV  testing. Fact Sheet for Patients: PinkCheek.be Fact Sheet for Healthcare Providers: GravelBags.it This test is not yet approved or cleared by the [place] FDA and  has been authorized for detection and/or diagnosis of SARS-CoV-2 by  FDA under an Emergency Use Authorization (EUA). This EUA will remain  in effect (meaning this test can be used) for the duration of the  Covid-19 declaration under Section 564(b)(1) of the Act, 21  U.S.C. section 360bbb-3(b)(1), unless the authorization is  terminated or revoked. Performed at [facility], [place] [address] [place], [place] [address]   MRSA PCR Screening     Status: Abnormal   Collection Time: 07/29/2019  7:35 AM   Specimen: Nasal Mucosa; Nasopharyngeal  Result Value Ref Range Status   MRSA by PCR POSITIVE (A) NEGATIVE Final    Comment:        The GeneXpert MRSA Assay (FDA approved for NASAL specimens only), is one component of a comprehensive MRSA colonization surveillance program. It is not intended to diagnose MRSA infection nor to guide or monitor treatment for MRSA infections. RESULT CALLED TO, READ BACK BY AND VERIFIED WITH: Mikal Plane RN 9:40 08/07/2019 ([email or web address]) Performed at [facility], [place] [address], [place], [place] [address]   Culture, respiratory (non-expectorated)     Status: None   Collection Time: 08/02/2019  7:38 AM   Specimen: Tracheal Aspirate; Respiratory  Result Value Ref Range Status   Specimen Description TRACHEAL ASPIRATE  Final   Special Requests NONE  Final   Gram Stain   Final    FEW WBC PRESENT, PREDOMINANTLY PMN MODERATE GRAM POSITIVE COCCI IN PAIRS  IN CLUSTERS MODERATE GRAM NEGATIVE RODS RARE GRAM NEGATIVE COCCOBACILLI Performed at [facility], [place] [address] [place], [place] [address]    Culture   Final    MODERATE METHICILLIN RESISTANT STAPHYLOCOCCUS AUREUS   Report Status 08/15/2019 FINAL  Final   Organism ID, Bacteria METHICILLIN RESISTANT STAPHYLOCOCCUS AUREUS  Final      Susceptibility   Methicillin resistant staphylococcus aureus - MIC*    CIPROFLOXACIN >=8 RESISTANT Resistant     ERYTHROMYCIN >=8 RESISTANT Resistant     GENTAMICIN <=0.5 SENSITIVE Sensitive     OXACILLIN >=4 RESISTANT Resistant     TETRACYCLINE <=1 SENSITIVE Sensitive     VANCOMYCIN 1 SENSITIVE Sensitive     TRIMETH/SULFA <=10 SENSITIVE Sensitive     CLINDAMYCIN <=0.25 SENSITIVE Sensitive     RIFAMPIN <=0.5 SENSITIVE Sensitive     Inducible Clindamycin NEGATIVE Sensitive     * MODERATE METHICILLIN RESISTANT STAPHYLOCOCCUS AUREUS  Culture, blood (routine x 2)     Status: None   Collection Time: 08/03/2019  9:46 AM   Specimen: BLOOD RIGHT ARM  Result Value Ref Range Status   Specimen Description BLOOD RIGHT ARM  Final   Special Requests   Final    AEROBIC BOTTLE ONLY Blood Culture results may not be optimal due to an inadequate volume of blood received in culture bottles Performed at [facility], [address], [place], [place] [address]    Culture NO GROWTH 5 DAYS  Final   Report Status 08/17/2019 FINAL  Final  Culture, blood (routine x 2)     Status: None   Collection Time: 07/31/2019  9:47 AM   Specimen: BLOOD  Result Value Ref Range Status   Specimen Description BLOOD RIGHT ANTECUBITAL  Final   Special Requests   Final    AEROBIC BOTTLE ONLY Blood Culture results may not be optimal due to an inadequate volume of blood received in culture bottles Performed at [facility], [place] [address], [place], [place] [address]    Culture NO GROWTH 5 DAYS  Final   Report Status 08/17/2019 FINAL  Final   Antimicrobials:   Antibiotics  Given (last 72 hours)    Date/Time Action Medication Dose Rate   08/17/19 1144 New Bag/Given   vancomycin (VANCOREADY) IVPB 750 mg/150 mL 750 mg 150 mL/hr   08/18/19 1221 New Bag/Given   vancomycin (VANCOREADY) IVPB 750 mg/150 mL 750 mg 150 mL/hr   08/19/19 1159 New Bag/Given   vancomycin (VANCOREADY) IVPB 750 mg/150 mL 750 mg 150 mL/hr     Interim history/subjective:  Patient remains somnolent, required minimal pressors for BP, otherwise vitals stable and patient slowly improving.   Objective   Blood pressure (!) 104/47, pulse 94, temperature 97.9 F (36.6 C), temperature source Axillary, resp. rate 17, height 5\' 9"  (1.753 m), weight 91.8 kg, SpO2 90 %.        Intake/Output Summary (Last 24 hours) at 08/19/2019 0830 Last data filed at 08/18/2019 2100 Gross per 24 hour  Intake 499.87 ml  Output -25 ml  Net 524.87 ml   Filed Weights   08/16/19 0419 08/17/19 0500 08/19/19 0500  Weight: 90 kg 93.2 kg 91.8 kg    Examination: General: Awake, alert, sleepy HENT:  Normocephalic, atraumatic, EOMI, PERRLA Lungs: Bilateral lower lobe crackles appreciated, normal work of breathing, satting 93% on room air Cardiovascular: RRR, clear S1-S2 present, no murmurs or gallops appreciated Abdomen: Soft, nondistended, normal bowel sounds appreciated, nontender to palpation Extremities: Left BKA, calciphylaxis appreciated to anterior lateral left thigh, wound dressing in place, no purulent drainage or bleeding appreciated; right lower extremity with 1+ pitting edema Neuro: Somnolent, no focal deficits, cranial nerves II-XII grossly intact  Resolved Hospital Problem list   Cardiac arrest Prolonged QTC Acute hypoxic respiratory failure  Assessment & Plan:   Multifactorial Shock, likely cardiogenic with superimposed septic shock, improved: Patient stable on minimal pressors.  Blood cultures from 1/28negative, completed Zosyn course.  Blood sugars low 70/50, MAP about 60. -Continue vancomycin for  MRSA pneumonia, plan 8-day course(01/29 - 02/05) -Continue stress dose steroids- Solu-Cortef 50mg  IV q6 - Will likely need to defer volume removal given his pressor needs  MRSA pneumonia, improving:Patient also with recent history ofpulmonary edema and HFrEF - No longer intubated or mechanically ventilated -Volume removal when he can tolerate hemodynamically -Continuous pulse ox monitoring  Difficult venous access: Due to history of vasculopathy it is difficult to obtain decent IV access in this patient.  Patient currently has left groin triple-lumen catheter that can be infused but RN has been unable to aspirate.  Patient also has tunneled HD catheter to right groin currently being used for dialysis (placed by Dr. Lynetta Mare 08/11/2019).  Attempted bedside IJ placement performed 2/4, was unsuccessful.  PICC team consulted, decline placement of PICC line due to patient's bilateral upper extremity fistulae from previous HD access. -Consulted interventional radiology for vascular access - appreciate recommendations  Acute Encephalopathy, improving: Patient is s/p cardiac arrest with seizure-like activity treated with midazolam, now resolved. Remains somnolent at baseline. -Frequent neurochecks, continue to monitor mental status -Begin to wean sedating medications as he can tolerate. He does have significant pain from his calciphylaxis  Calciphylaxis, chronic, stable:  Patient denies significant pain from his calciphylaxis this morning and 2/4.  Adequate pain control with Dilaudid, gabapentin. -Continue gabapentin200mg  BID - Clonazepam 1mg  BID - Dilaudid 4mg  q6 - Continue Sodium Thiosulfate 25mg  IV qMWF  ESRDon HD MWF: No longer on CRRT - Nephrology consulted - appreciate recommendations, plan to initiate HD 2/4 pending verified access and patient stability - Heparin w/ CRRT  Anemia of Chronic Disease, chronic, stable:Hgb 7.1>10.5 s/p 2u RBC 2/02. Also transfused 2 units RBC  01/28-01/29. - f/u AM CBC 2/5 -Transfuse Hgb <7 with RBC. - Aranesp IV 115mcg every Saturday  Type 2Diabetes Mellitus:Some blood sugar readings have been very low (less than 50), however there is concern that they are not accurate as serum glucose levels shortly after reading the 300s.  Patient asymptomatic.  Will obtain better access for more accurate monitoring. - mSSI  - Levemir 8 units BID - CBGs q4  Best practice:  Diet: Renal/carb modified with fluid restriction Pain/Anxiety/Delirium protocol (if indicated): Dilaudid 4 mg every 6, Cymbalta 20 mg, gabapentin 200 mg nightly, Klonopin 1 mg twice daily DVT prophylaxis: Heparin GI prophylaxis: None at this time Glucose control: Levemir 8 units twice daily, moderate sliding scale insulin Mobility: Limited secondary to pain, left BKA Code Status: DNR Family Communication: None Disposition: Transfer to floor once stable  Labs   CBC: Recent Labs  Lab 08/16/19 0258 08/16/19 0258 08/16/19 1520 08/17/19 0732 08/17/19 1700 08/18/19 0341 08/18/19 0615  WBC 14.4*  --   --  13.8* 19.4* 17.2* 14.4*  HGB 7.1*   < >  7.8* 6.5* 10.0* 10.7* 10.5*  HCT 27.1*   < > 23.0* 23.1* 33.3* 35.8* 34.7*  MCV 98.2  --   --  93.5 90.5 89.9 90.6  PLT 166  --   --  190 186 204 173   < > = values in this interval not displayed.    Basic Metabolic Panel: Recent Labs  Lab 08/16/19 0258 08/16/19 1520 08/17/19 0430 08/17/19 0430 08/17/19 1600 08/17/19 1600 08/18/19 0050 08/18/19 0341 08/18/19 0615 08/18/19 1817 08/19/19 0500  NA 140   < > 136   < > 139  --   --  138 139 142 144  K 4.4   < > 2.9*   < > 3.5  --   --  2.3* 3.3* 3.5 3.9  CL 101   < > 102   < > 100  --   --  100 101 108 105  CO2 16*   < > 21*   < > 22  --   --  25 23 18* 20*  GLUCOSE 236*   < > 228*   < > 173*   < > 122* 212* 187* 386* 56*  BUN 39*   < > 36*   < > 40*  --   --  26* 38* 28* 40*  CREATININE 2.73*   < > 2.29*   < > 2.43*  --   --  1.57* 2.51* 1.84* 2.92*    CALCIUM 8.7*   < > 8.0*   < > 8.1*  --   --  8.0* 8.0* 6.9* 8.0*  MG 2.9*  --  2.7*  --   --   --   --  2.5* 2.6*  --  2.6*  PHOS 4.1   < > 2.3*   < > 2.7  --   --  1.7* 2.8 2.1* 3.5   < > = values in this interval not displayed.   GFR: Estimated Creatinine Clearance: 32 mL/min (A) (by C-G formula based on SCr of 2.92 mg/dL (H)). Recent Labs  Lab 08/03/2019 1610 08/13/19 0357 08/13/19 1439 08/15/19 1439 08/17/19 0732 08/17/19 1700 08/18/19 0341 08/18/19 0615  PROCALCITON 10.03  --   --   --   --   --   --   --   WBC  --  17.9*  --    < > 13.8* 19.4* 17.2* 14.4*  LATICACIDVEN  --  3.5* 1.8  --   --   --   --   --    < > = values in this interval not displayed.    Liver Function Tests: Recent Labs  Lab 08/17/19 1600 08/18/19 0341 08/18/19 0615 08/18/19 1817 08/19/19 0500  ALBUMIN 2.0* 2.1* 1.9* 1.7* 1.8*   No results for input(s): LIPASE, AMYLASE in the last 168 hours. No results for input(s): AMMONIA in the last 168 hours.  ABG    Component Value Date/Time   PHART 7.422 08/16/2019 1520   PCO2ART 33.7 08/16/2019 1520   PO2ART 177.0 (H) 08/16/2019 1520   HCO3 21.9 08/16/2019 1520   TCO2 23 08/16/2019 1520   ACIDBASEDEF 2.0 08/16/2019 1520   O2SAT 100.0 08/16/2019 1520     Coagulation Profile: Recent Labs  Lab 07/29/2019 1610  INR 1.4*    Cardiac Enzymes: No results for input(s): CKTOTAL, CKMB, CKMBINDEX, TROPONINI in the last 168 hours.  HbA1C: Hgb A1c MFr Bld  Date/Time Value Ref Range Status  08/15/2019 07:30 PM 5.6 4.8 - 5.6 % Final  Comment:    (NOTE) Pre diabetes:          5.7%-6.4% Diabetes:              >6.4% Glycemic control for   <7.0% adults with diabetes     CBG: Recent Labs  Lab 08/18/19 0337 08/18/19 1542 08/18/19 1702 08/18/19 1946 08/19/19 0810  GLUCAP 204* 60* <10* 50* <10*    Review of Systems:   Endorses pain to left buttock, left thigh; fatigue, drowsiness Denies headache, fever, chest pain  Surgical History    History reviewed. No pertinent surgical history.   Social History    None  Family History   His family history is not on file.   Allergies Allergies  Allergen Reactions  . Amlodipine Itching  . Dacarbazine And Related Nausea And Vomiting  . Fentanyl Itching  . Hydrocodone Nausea And Vomiting  . Propofol Itching     Home Medications  Prior to Admission medications   Medication Sig Start Date End Date Taking? Authorizing Provider  b complex-vitamin c-folic acid (NEPHRO-VITE) 0.8 MG TABS tablet Take 1 tablet by mouth daily. 05/18/19   [provider]  calcium acetate (PHOSLO) 667 MG tablet Take 1,334 mg by mouth 3 (three) times daily. 07/21/19   [provider]  COLCRYS 0.6 MG tablet Take 0.6 mg by mouth daily as needed (gout flare).  03/24/19   [provider]  hydrALAZINE (APRESOLINE) 25 MG tablet Take 37.5 mg by mouth 3 (three) times daily. 03/29/19   [provider]  isosorbide mononitrate (IMDUR) 30 MG 24 hr tablet Take 30 mg by mouth daily. 03/29/19   [provider]  LEVEMIR FLEXTOUCH 100 UNIT/ML Pen Inject 25 Units into the skin daily. 07/30/19   [provider]  metoprolol succinate (TOPROL-XL) 50 MG 24 hr tablet Take 50 mg by mouth daily. 03/29/19   [provider]  NOVOLIN 70/30 FLEXPEN (70-30) 100 UNIT/ML PEN Inject 20 Units into the skin daily. 03/11/19   [provider]  NOVOLOG FLEXPEN 100 UNIT/ML FlexPen Inject 1 Units into the skin at bedtime. 07/30/19   [provider]  oxyCODONE-acetaminophen (PERCOCET) 7.5-325 MG tablet Take 1 tablet by mouth every 4 (four) hours as needed. 07/26/19   [provider]  oxyCODONE-acetaminophen (PERCOCET/ROXICET) 5-325 MG tablet Take 1 tablet by mouth 4 (four) times daily as needed. 07/19/19   [provider]  pantoprazole (PROTONIX) 40 MG tablet Take 40 mg by mouth daily. 06/23/19   [provider]  RENVELA 800 MG tablet Take 2,400 mg by  mouth 3 (three) times daily. 03/11/19   [provider]  rosuvastatin (CRESTOR) 5 MG tablet Take 5 mg by mouth daily. 03/11/19   [provider]  TRULICITY [id number] [id number] SOPN Inject 1 mg into the skin once a week. 07/30/19   [provider]  VELPHORO 500 MG chewable tablet Chew 500 mg by mouth 3 (three) times daily. 06/08/19   [provider]  VENTOLIN HFA 108 (90 Base) MCG/ACT inhaler Inhale 2 puffs into the lungs 4 (four) times daily as needed for wheezing or shortness of breath. 06/23/19   [provider]     Critical care time: [place], Sweeny, PGY-2 08/19/2019 8:30 AM

## 2019-08-19 NOTE — Progress Notes (Signed)
Laboratory personnel was unable to electronically submit result of random glucose in chart. Laboratory personnel sent a paper copy of results. RN placed results in paper chart and treated glucose level according to laboratory numbers.

## 2019-08-19 NOTE — Progress Notes (Signed)
[name] Progress Note  Subjective: doing well on [place] now.  Off CRRT since ~7pm yesterday.  This AM NPO for new central access.   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  Access right femoral TDC in place with dressing c/d/i  [facility] MWF   R fem TDC   450/800  2/2 bath  86kg   -mircera 200 mcg every 2 weeks last on 1/16 -Sodium thiosulfate 25 gram every treatment  -Weekly venofer -parsabiv 2.5 mg three times a week    CXR 1/28 - no active disease  Assessment/Plan:  # Cardiac arrest/ found down - Supportive care per pulm critical care--> improving  # Acute hypoxic respiratory failure - extubated to [place] now  # End-stage renal disease - Normally reported as MWF schedule  - CRRT started 1/28 and off 2/3 PM - His BPs seem a bit better with midodrine and I think it's reasonable to try iHD tomorrow  #hypokalemia: K 3.9 this AM, plan for 4K dialysate tomorrow.  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   Ricky Hick MD [facility]   Inpatient medications: . B-complex with vitamin C  1 tablet Oral Daily  . chlorhexidine gluconate (MEDLINE KIT)  15 mL Mouth Rinse BID  . Chlorhexidine Gluconate Cloth  6 each Topical Daily  . cinacalcet  30 mg Oral Q breakfast  . clonazePAM  1 mg Oral BID  . [START ON 08/21/2019] darbepoetin (ARANESP) injection - DIALYSIS  150 mcg Intravenous Q Sat-HD  . DULoxetine  20 mg Oral Daily  . feeding supplement (PRO-STAT SUGAR FREE 64)  60 mL Oral TID  . gabapentin  200 mg Oral QHS  . HYDROmorphone  4 mg Oral Q6H  . insulin aspart  0-15 Units Subcutaneous Q6H  . insulin detemir  8 Units Subcutaneous BID  . midodrine  10 mg Per Tube TID WC  . mupirocin ointment  1 application Nasal BID  . sodium chloride flush  10-40 mL Intracatheter Q12H   .  prismasol BGK 4/2.5 400 mL/hr at 08/18/19 1224  .  prismasol BGK 4/2.5 200 mL/hr at 08/18/19 1209  . feeding supplement (VITAL 1.5 CAL) Stopped (08/18/19 0200)  . heparin 10,000 units/  20 mL infusion syringe 2,500 Units/hr (08/18/19 0047)  . norepinephrine (LEVOPHED) Adult infusion Stopped (08/18/19 0135)  . prismasol BGK 4/2.5 1,800 mL/hr at 08/18/19 1610  . sodium thiosulfate infusion for calciphylaxis Stopped (08/18/19 1537)  . vancomycin Stopped (08/18/19 1322)   albuterol, bisacodyl, heparin, heparin, heparin, hydrOXYzine, influenza vac split quadrivalent PF, LORazepam, pneumococcal 23 valent vaccine, senna-docusate, sodium chloride flush Recent Labs  Lab 08/18/19 1817 08/19/19 0500  NA 142 144  K 3.5 3.9  CL 108 105  CO2 18* 20*  GLUCOSE 386* 56*  BUN 28* 40*  CREATININE 1.84* 2.92*  CALCIUM 6.9* 8.0*  PHOS 2.1* 3.5

## 2019-08-19 NOTE — Procedures (Signed)
Interventional Radiology Procedure:   Indications: Limited venous access and ESRD on hemodialysis  Procedure: Placement of left external jugular line, left central venogram  Findings: Patent left external jugular vein.  Occlusion of central left innominate vein.  Placed 8 cm dual lumen PowerPICC with tip in left innominate vein.  Both lumens aspirate and flush.   Complications: none     EBL: less than 10 ml  Plan: Left external jugular line is ready to use.     Yakov Bergen R. Anselm Pancoast, MD  Pager: [phone]

## 2019-08-19 NOTE — Progress Notes (Addendum)
Nutrition Follow up  DOCUMENTATION CODES:   Not applicable  INTERVENTION:   D/C Vital 1.5, b complex with Vitamin C   Add Nepro Shake po TID, each supplement provides 425 kcal and 19 grams protein  Continue 30 ml Prostat BID, each supplement provides 100 kcals and 15 grams protein.   Add renal MVI daily   NUTRITION DIAGNOSIS:   Inadequate oral intake related to inability to eat as evidenced by NPO status.  GOAL:   Patient will meet greater than or equal to 90% of their needs   MONITOR:   TF tolerance, Labs, Weight trends, I & O's, Skin, Vent status  REASON FOR ASSESSMENT:   Ventilator    ASSESSMENT:   Pt with a PMH significant for L BKA, ESRD, DM, GERD, secondary hyperparathyroidism calciphylaxis and PVD who was brought to the ED s/p cardiac arrest.   1/28- start CRRT 2/3- extubated, stop CRRT   RD working remotely.  Pt NPO for new central access. Hopeful to restart HD tomorrow.   Diet advanced yesterday. Pt without meal completions documented at this time. Discussed the importance of protein intake for preservation of lean body mass and to promote wound healing. Pt willing to try supplementation. RD encouraged intake once able to eat.   EDW: 86 kg  Current weight: 91.8 kg   I/O: +4,146 ml since admit   Drips: sodium thiosulfate  Medications: sensipar, aranesp, SS novolog, levemir, rena-vit Labs: Mg 2.6 (H) corrected calcium 9.8 (wdl) CBG <10-251?  Diet Order:   Diet Order            Diet renal/carb modified with fluid restriction Diet-HS Snack? Nothing; Fluid restriction: 1200 mL Fluid; Room service appropriate? Yes; Fluid consistency: Thin  Diet effective now              EDUCATION NEEDS:   Not appropriate for education at this time  Skin:  Skin Assessment: Skin Integrity Issues: Skin Integrity Issues:: Other (Comment) Other: calciphylaxis- penis, L thigh  Last BM:  2/3  Height:   Ht Readings from Last 1 Encounters:  07/31/2019 5\' 9"   (1.753 m)    Weight:   Wt Readings from Last 1 Encounters:  08/19/19 91.8 kg    Ideal Body Weight:  67.97 kg  BMI:  Body mass index is 29.89 kg/m.  Estimated Nutritional Needs:   Kcal:  2300-2500 kcal  Protein:  120-135 grams  Fluid:  1000 ml + UOP   Mariana Single RD, LDN Clinical Nutrition Pager # [phone]

## 2019-08-19 NOTE — Procedures (Signed)
Central Venous Catheter Insertion Procedure Note [name] [id number] [birth year]  Assessed Right and left IJ for CVL placement. Unable to located LIJ on ultrasound. RIJ present and compressible. Consent obtained and time out performed. Using sterile technique and 1% lidocaine for local anesthesia the RIJ was accessed with a 20 gauge needle with good blood return. I was unable to advance guidewire more than a few cm despite good blood return in syringe for 4 attempts.   Will follow up with attending MD regarding plan of care.   Georgann Housekeeper, AGACNP-BC [employer]  See Amion for personal pager PCCM on call pager [phone]  08/19/2019 1:54 PM

## 2019-08-19 NOTE — Progress Notes (Signed)
PT Cancellation Note  Patient Details Name: [name] MRN: [id number] DOB: 1966/05/26   Cancelled Treatment:    Reason Eval/Treat Not Completed: Patient at procedure or test/unavailable;Medical issues which prohibited therapy. PT attempted to see patient for evaluation twice on this date, pt remains with temporary femoral catheter In place, heading to IR for possible tunneling vs removal. PT will attempt to follow up when patient is more medically appropriate to mobilize with physical therapy.   Zenaida Niece 08/19/2019, 3:35 PM

## 2019-08-20 LAB — RENAL FUNCTION PANEL
Albumin: 2 g/dL — ABNORMAL LOW (ref 3.5–5.0)
Anion gap: 22 — ABNORMAL HIGH (ref 5–15)
BUN: 57 mg/dL — ABNORMAL HIGH (ref 6–20)
CO2: 17 mmol/L — ABNORMAL LOW (ref 22–32)
Calcium: 8 mg/dL — ABNORMAL LOW (ref 8.9–10.3)
Chloride: 100 mmol/L (ref 98–111)
Creatinine, Ser: 3.87 mg/dL — ABNORMAL HIGH (ref 0.61–1.24)
GFR calc Af Amer: 19 mL/min — ABNORMAL LOW (ref >60)
GFR calc non Af Amer: 16 mL/min — ABNORMAL LOW (ref >60)
Glucose, Bld: 133 mg/dL — ABNORMAL HIGH (ref 70–99)
Phosphorus: 4.7 mg/dL — ABNORMAL HIGH (ref 2.5–4.6)
Potassium: 4.2 mmol/L (ref 3.5–5.1)
Sodium: 139 mmol/L (ref 135–145)

## 2019-08-20 LAB — CBC
HCT: 31.6 % — ABNORMAL LOW (ref 39.0–52.0)
Hemoglobin: 9.3 g/dL — ABNORMAL LOW (ref 13.0–17.0)
MCH: 27.5 pg (ref 26.0–34.0)
MCHC: 29.4 g/dL — ABNORMAL LOW (ref 30.0–36.0)
MCV: 93.5 fL (ref 80.0–100.0)
Platelets: 205 10*3/uL (ref 150–400)
RBC: 3.38 MIL/uL — ABNORMAL LOW (ref 4.22–5.81)
RDW: 22.5 % — ABNORMAL HIGH (ref 11.5–15.5)
WBC: 19.7 10*3/uL — ABNORMAL HIGH (ref 4.0–10.5)
nRBC: 0.8 % — ABNORMAL HIGH (ref 0.0–0.2)

## 2019-08-20 LAB — MAGNESIUM: Magnesium: 2.4 mg/dL (ref 1.7–2.4)

## 2019-08-20 LAB — GLUCOSE, CAPILLARY
Glucose-Capillary: 113 mg/dL — ABNORMAL HIGH (ref 70–99)
Glucose-Capillary: 111 mg/dL — ABNORMAL HIGH (ref 70–99)
Glucose-Capillary: 160 mg/dL — ABNORMAL HIGH (ref 70–99)
Glucose-Capillary: 147 mg/dL — ABNORMAL HIGH (ref 70–99)

## 2019-08-20 LAB — PTH, INTACT AND CALCIUM
Calcium, Total (PTH): 7.4 mg/dL — ABNORMAL LOW (ref 8.7–10.2)
PTH: 124 pg/mL — ABNORMAL HIGH (ref 15–65)

## 2019-08-20 LAB — GLUCOSE, RANDOM: Glucose, Bld: 113 mg/dL — ABNORMAL HIGH (ref 70–99)

## 2019-08-20 LAB — HEPATITIS B SURFACE ANTIGEN: Hepatitis B Surface Ag: NONREACTIVE

## 2019-08-20 MED ORDER — LIDOCAINE HCL (PF) 1 % IJ SOLN: 5.0000 mL | INTRAMUSCULAR | Status: DC | PRN

## 2019-08-20 MED ORDER — ALTEPLASE 2 MG IJ SOLR
2.0000 mg | Freq: Once | INTRAMUSCULAR | Status: AC | PRN
  Administered 2019-08-23: 2 mg
  Filled 2019-08-20 (×3): qty 2

## 2019-08-20 MED ORDER — HEPARIN SODIUM (PORCINE) 1000 UNIT/ML DIALYSIS
1000.0000 [IU] | INTRAMUSCULAR | Status: DC | PRN
  Administered 2019-08-25: 17:00:00 1000 [IU] via INTRAVENOUS_CENTRAL
  Filled 2019-08-20 (×2): qty 1

## 2019-08-20 MED ORDER — HEPARIN SODIUM (PORCINE) 1000 UNIT/ML IJ SOLN
INTRAMUSCULAR | Status: AC
  Filled 2019-08-20: qty 4

## 2019-08-20 MED ORDER — SODIUM CHLORIDE 0.9 % IV SOLN: 100.0000 mL | INTRAVENOUS | Status: DC | PRN

## 2019-08-20 MED ORDER — HYDROMORPHONE HCL 2 MG PO TABS
4.0000 mg | ORAL_TABLET | Freq: Four times a day (QID) | ORAL | Status: DC | PRN
  Administered 2019-08-20 – 2019-08-28 (×11): 4 mg via ORAL
  Filled 2019-08-20 (×11): qty 2

## 2019-08-20 MED ORDER — ALBUMIN HUMAN 25 % IV SOLN
25.0000 g | Freq: Once | INTRAVENOUS | Status: AC
  Administered 2019-08-20: 25 g via INTRAVENOUS
  Filled 2019-08-20: qty 100

## 2019-08-20 MED ORDER — CHLORHEXIDINE GLUCONATE 0.12 % MT SOLN
OROMUCOSAL | Status: AC
  Filled 2019-08-20: qty 15

## 2019-08-20 MED ORDER — LIDOCAINE-PRILOCAINE 2.5-2.5 % EX CREA: 1.0000 "application " | TOPICAL_CREAM | CUTANEOUS | Status: DC | PRN

## 2019-08-20 MED ORDER — HEPARIN SODIUM (PORCINE) 1000 UNIT/ML IJ SOLN
INTRAMUSCULAR | Status: AC
  Administered 2019-08-20: 6200 [IU] via INTRAVENOUS_CENTRAL
  Filled 2019-08-20: qty 2

## 2019-08-20 MED ORDER — PENTAFLUOROPROP-TETRAFLUOROETH EX AERO: 1.0000 "application " | INHALATION_SPRAY | CUTANEOUS | Status: DC | PRN

## 2019-08-20 NOTE — Evaluation (Signed)
Physical Therapy Evaluation Patient Details Name: [name] MRN: [id number] DOB: 08/15/1965 Today's Date: 08/20/2019   History of Present Illness  54 y.o. male with history of ESRD on HD with witnessed cardiac arrest, obtained ROSC after 3 minutes.  Required intubation for ongoing airway protection, ongoing unresponsiveness, and hypotension requiring vasopressor support.  Also with MRSA pneumonia on vancomycin.  Patient extubated 08/18/2019, off pressors.  Clinical Impression  Pt presents to PT with deficits in functional mobility, gait, balance, endurance, strength, power, cognition. Pt currently requires significant physical assistance to perform all functional mobility and is unsafe to transfer out of bed without the use of lift equipment. Pt is generally weak, requiring assistance to maintain sitting balance and fatiguing quickly. Pt also with pain in BLE during session, somewhat limiting sitting tolerance. Pt will benefit from acute PT POC to reduce falls risk and caregiver burden.    Follow Up Recommendations SNF;Supervision/Assistance - 24 hour    Equipment Recommendations  Wheelchair (measurements PT);Wheelchair cushion (measurements PT);Other (comment);Hospital bed(mechanical lift)    Recommendations for Other Services       Precautions / Restrictions Precautions Precautions: Fall Restrictions Weight Bearing Restrictions: No Other Position/Activity Restrictions: old L BKA      Mobility  Bed Mobility Overal bed mobility: Needs Assistance Bed Mobility: Supine to Sit     Supine to sit: Max assist        Transfers Overall transfer level: Needs assistance Equipment used: 2 person hand held assist Transfers: Sit to/from Stand Sit to Stand: Max assist         General transfer comment: pt performs 2 sit to stands with maxA of PT, coming to half stand, unable to fully extend RLE but able to fully clear buttocks. Pt then lifted from bed to recliner with maxisky  with assistance of RN  Ambulation/Gait                Stairs            Wheelchair Mobility    Modified Rankin (Stroke Patients Only)       Balance Overall balance assessment: Needs assistance Sitting-balance support: Bilateral upper extremity supported;Feet supported Sitting balance-Leahy Scale: Poor Sitting balance - Comments: modA with fatigue, minG initially Postural control: Right lateral lean;Posterior lean Standing balance support: Bilateral upper extremity supported Standing balance-Leahy Scale: Zero Standing balance comment: maxA with BUE support and R knee block                             Pertinent Vitals/Pain Pain Assessment: Faces Faces Pain Scale: Hurts even more Pain Location: LLE Pain Descriptors / Indicators: Grimacing Pain Intervention(s): Limited activity within patient's tolerance    Home Living Family/patient expects to be discharged to:: Private residence Living Arrangements: Alone Available Help at Discharge: Family;Available PRN/intermittently Type of Home: House Home Access: Ramped entrance     Home Layout: One level Home Equipment: (unable to determine at this time) Additional Comments: pt groggy during session, appears to be an unreliable historian, no family present    Prior Function Level of Independence: Independent         Comments: mplete independence with use of prosthetic     Hand Dominance        Extremity/Trunk Assessment   Upper Extremity Assessment Upper Extremity Assessment: Generalized weakness    Lower Extremity Assessment Lower Extremity Assessment: Generalized weakness    Cervical / Trunk Assessment Cervical / Trunk Assessment: Normal  Communication   Communication: Other (comment)(hypophonation, garbled at times)  Cognition Arousal/Alertness: Lethargic Behavior During Therapy: Flat affect Overall Cognitive Status: No family/caregiver present to determine baseline cognitive  functioning                                 General Comments: pt oriented to self and place, not to time and situation. Pt follows commands well with some increased time to respond. Pt may be an unreliable historian but no family present to confirm      General Comments General comments (skin integrity, edema, etc.): VSS on RA    Exercises     Assessment/Plan    PT Assessment Patient needs continued PT services  PT Problem List Decreased strength;Decreased range of motion;Decreased activity tolerance;Decreased balance;Decreased mobility;Decreased cognition;Decreased knowledge of use of DME;Decreased safety awareness;Decreased knowledge of precautions;Pain       PT Treatment Interventions DME instruction;Gait training;Therapeutic activities;Functional mobility training;Therapeutic exercise;Balance training;Neuromuscular re-education;Patient/family education;Wheelchair mobility training    PT Goals (Current goals can be found in the Care Plan section)  Acute Rehab PT Goals Patient Stated Goal: To improve mobility PT Goal Formulation: With patient Time For Goal Achievement: 09/03/19 Potential to Achieve Goals: Fair Additional Goals Additional Goal #1: Pt will mobilize in manual wheelchair with BUE and supervision for 50'.    Frequency Min 2X/week   Barriers to discharge        Co-evaluation               AM-PAC PT "6 Clicks" Mobility  Outcome Measure Help needed turning from your back to your side while in a flat bed without using bedrails?: Total Help needed moving from lying on your back to sitting on the side of a flat bed without using bedrails?: Total Help needed moving to and from a bed to a chair (including a wheelchair)?: Total Help needed standing up from a chair using your arms (e.g., wheelchair or bedside chair)?: Total Help needed to walk in hospital room?: Total Help needed climbing 3-5 steps with a railing? : Total 6 Click Score: 6     End of Session   Activity Tolerance: Patient limited by fatigue Patient left: in chair;with chair alarm set;with call bell/phone within reach Nurse Communication: Mobility status;Need for lift equipment PT Visit Diagnosis: Muscle weakness (generalized) (M62.81);Other abnormalities of gait and mobility (R26.89)    Time: [id number] PT Time Calculation (min) (ACUTE ONLY): 30 min   Charges:   PT Evaluation $PT Eval High Complexity: 1 High PT Treatments $Therapeutic Activity: 8-22 mins        Zenaida Niece, PT, DPT Acute Rehabilitation Pager: [phone]   Zenaida Niece 08/20/2019, 2:08 PM

## 2019-08-20 NOTE — Progress Notes (Signed)
Inpatient Diabetes Program Recommendations  AACE/ADA: New Consensus Statement on Inpatient Glycemic Control  Target Ranges:  Prepandial:   less than 140 mg/dL      Peak postprandial:   less than 180 mg/dL (1-2 hours)      Critically ill patients:  140 - 180 mg/dL   Results for MOHSEN, HELMER (MRN [id number]) as of 08/20/2019 12:04  Ref. Range 08/19/2019 08:10 08/19/2019 16:13 08/19/2019 20:34 08/19/2019 23:30 08/20/2019 06:03 08/20/2019 08:28  Glucose-Capillary Latest Ref Range: 70 - 99 mg/dL <10 (LL) 18 (LL) 136 (H) 164 (H) 113 (H) 111 (H)   Review of Glycemic Control  Current orders for Inpatient glycemic control: Levemir 8 units BID, Novolog 0-15 units Q6H  Inpatient Diabetes Program Recommendations:   Insulin - Basal: Please consider decreasing Levemir to 4 units BID.  Correction (SSI): Please consider decreasing Novolog correction to 0-6 units and change frequency to AC&HS if patient is eating.  Thanks, Barnie Alderman, RN, MSN, CDE Diabetes Coordinator Inpatient Diabetes Program [phone] (Team Pager from 8am to 5pm)

## 2019-08-20 NOTE — Progress Notes (Signed)
OT Cancellation Note  Patient Details Name: [name] MRN: [id number] DOB: September 26, 1965   Cancelled Treatment:    Reason Eval/Treat Not Completed: Patient at procedure or test/ unavailable, pt getting setup for dialysis in room.  Will follow and see as able.   Jolaine Artist, OT Acute Rehabilitation Services Pager [phone] Office [phone]    Delight Stare 08/20/2019, 7:50 AM

## 2019-08-20 NOTE — Progress Notes (Signed)
[facility] Progress Note  Subjective: having HD this AM.  Cuff BPs in high 70s but he's asymptomatic and awake.   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  Access right femoral TDC in place with dressing c/d/i  [place] MWF   R fem TDC   450/800  2/2 bath  86kg   -mircera 200 mcg every 2 weeks last on 1/16 -Sodium thiosulfate 25 gram every treatment  -Weekly venofer -parsabiv 2.5 mg three times a week    CXR 1/28 - no active disease  Assessment/Plan:  # Cardiac arrest/ found down - Supportive care per pulm critical care--> improved  # Acute hypoxic respiratory failure - extubated to [place] now  # End-stage renal disease - Normally reported as MWF schedule  - CRRT started 1/28 and off 2/3 PM - He has asymptomatic hypotension currently on cuff pressure (no A line); dec machine T to 35 degrees, give albumin 25g IV now.  WIll trial UF 1.5L but may not tolerate that much.  Stool outpt yesterday noted to be 1.1L and ins not much.  Has NE gtt running but would prefer to not increase it.    #hypokalemia: K 4.2 this AM, 4K dialysate today.  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   [name] [facility]   Inpatient medications: . chlorhexidine gluconate (MEDLINE KIT)  15 mL Mouth Rinse BID  . Chlorhexidine Gluconate Cloth  6 each Topical Daily  . Chlorhexidine Gluconate Cloth  6 each Topical Q0600  . cinacalcet  30 mg Oral Q breakfast  . clonazePAM  1 mg Oral BID  . [START ON 08/21/2019] darbepoetin (ARANESP) injection - DIALYSIS  150 mcg Intravenous Q Sat-HD  . DULoxetine  20 mg Oral Daily  . feeding supplement (NEPRO CARB STEADY)  237 mL Oral TID BM  . feeding supplement (PRO-STAT SUGAR FREE 64)  30 mL Oral BID  . gabapentin  200 mg Oral QHS  . heparin      . HYDROmorphone  4 mg Oral Q6H  . insulin aspart  0-15 Units Subcutaneous Q6H  . insulin detemir  8 Units Subcutaneous BID  . midodrine  10 mg Oral TID WC  . multivitamin  1 tablet Oral QHS  . sodium chloride flush  10-40 mL Intracatheter Q12H   . sodium chloride    . sodium chloride    . heparin 10,000 units/ 20 mL infusion syringe 2,500 Units/hr (08/18/19 0047)  . norepinephrine (LEVOPHED) Adult infusion 4 mcg/min (08/19/19 1631)  . sodium thiosulfate infusion for calciphylaxis Stopped (08/18/19 1537)   sodium chloride, sodium chloride, albuterol, alteplase, bisacodyl, heparin, heparin, heparin, heparin, hydrOXYzine, influenza vac split quadrivalent PF, lidocaine (PF), lidocaine (PF), lidocaine-prilocaine, LORazepam, pentafluoroprop-tetrafluoroeth, pneumococcal 23 valent vaccine, senna-docusate, sodium chloride flush Recent Labs  Lab 08/19/19 1806 08/19/19 1806 08/19/19 2228 08/20/19 0730  NA 137   < > 139 139  K 4.3   < > 4.0 4.2  CL 100   < > 102 100  CO2 18*   < > 17* 17*   GLUCOSE 168*   < > 160* 133*  BUN 50*   < > 50* 57*  CREATININE 3.66*   < > 3.77* 3.87*  CALCIUM 7.4*   < > 7.5* 8.0*  PHOS 3.9  --   --  4.7*   < > = values in this interval not displayed.

## 2019-08-20 NOTE — Progress Notes (Signed)
NAME:  [name], MRN:  [id number], DOB:  [birth year], LOS: 8 ADMISSION DATE:  08/08/2019, CONSULTATION DATE: 07/17/2019 REFERRING MD: Dr. Roxanne Mins, CHIEF COMPLAINT: Cardiac arrest  Brief History   54 year old male with history of ESRD on HD, acute on chronic anemia, severe calciphylaxis, and type 2 diabetes.    History of present illness   Patient was found unresponsive and had witnessed cardiac arrest, obtained ROSC after 3 minutes.  Patient required intubation for ongoing airway protection and unresponsiveness.  Required vasopressor support for hypotension.  Additionally found to have MRSA pneumonia, currently being treated with vancomycin.  Patient is now extubated since 08/18/2019.  Past Medical History   Patient Active Problem List   Diagnosis Date Noted  . Acute respiratory failure ([place])   . Cardiac arrest ([place]) 08/09/2019  . End-stage renal disease on hemodialysis ([place])   . Hyperkalemia   . Anemia due to chronic kidney disease, on chronic dialysis ([place])   . Encephalopathy acute   . Calciphylaxis   -Left BKA -Anemia -Bronchitis -CHF (congestive heart failure) -ESRD on HD MWF -Type 2 diabetes -Dysrhythmia -GERD -Hyperparathyroidism -Morbid obesity  Significant Hospital Events   1/28 Admitted. On levophed and added vasopressin  1/29: O/N Hyperkalemic, CRRT for correction also GOC discussion with family, made DNR. Still on levophed and multiple analgesics/sedatives. 2/03: Extubated, off pressors 2/04: Attempted bedside central line placement, IR consulted and placed L External Jugular line.  Consults:  Nephrology  Procedures:  2/4 - central line placement by IR  Significant Diagnostic Tests:  DG Chest 1 View  Result Date: 08/19/2019 CLINICAL DATA:  Central line placement EXAM: CHEST  1 VIEW COMPARISON:  08/17/2019 FINDINGS: Frontal view of the chest demonstrates stable cardiac silhouette. Vascular stent right subclavian distribution unchanged. Central venous  catheter from an inferior approach tip overlies the junction of the inferior vena cava and right atrium. I do not see any new central venous catheter is on this exam. Stable bibasilar scarring. No airspace disease, effusion, or pneumothorax. IMPRESSION: 1. Stable central venous catheter from an inferior approach. 2. No other central lines are identified on this exam. Electronically Signed   By: Randa Ngo M.D.   On: 08/19/2019 14:14   CT Head Wo Contrast  Result Date: 08/04/2019 CLINICAL DATA:  Cerebral hemorrhage suspected, unresponsive, found by friend post witnessed arrest EXAM: CT HEAD WITHOUT CONTRAST TECHNIQUE: Contiguous axial images were obtained from the base of the skull through the vertex without intravenous contrast. COMPARISON:  None. FINDINGS: Brain: No evidence of acute infarction, hemorrhage, hydrocephalus, extra-axial collection or mass lesion/mass effect. Symmetric prominence of the ventricles, cisterns and sulci compatible with parenchymal volume loss. Patchy areas of white matter hypoattenuation are most compatible with chronic microvascular angiopathy. Extensive dural calcifications are noted along the falx and tentorium. Vascular: Extensive calcification of the carotid siphons and vertebral arteries. No unexpected hyperdense vessel is evident. Skull: No calvarial fracture or suspicious osseous lesion. No scalp swelling or hematoma. Diffuse scalp edema is seen. Benign scattered scalp calcifications. Sinuses/Orbits: Patient is intubated at the time of exam with tract transesophageal and endotracheal tubes extending below the level of imaging. Secretions in the ethmoids and posterior nasopharynx are likely related to this intubation. Minimal mucosal thickening in the maxillary sinuses. Prior lens extractions are noted. Mildly proptotic configuration of the globes which is symmetric bilaterally. No abnormal thickening of the extraocular musculature or abnormality of the optic nerve should  complexes or superior ophthalmic veins. Other: None IMPRESSION: 1. No CT evidence of  acute intracranial process. 2. Chronic microvascular ischemic disease and parenchymal volume loss. 3. Diffuse soft tissue edema.  Correlate with hydration status. 4. Extensive intracranial atherosclerosis. 5. Secretions in the ethmoids and posterior nasopharynx are likely to intubation. 6. Mildly proptotic configuration of the globes which is symmetric bilaterally. Electronically Signed   By: Lovena Le M.D.   On: 08/11/2019 02:43   IR Fluoro Guide CV Line Left  Result Date: 08/19/2019 INDICATION: 55 year old with poor venous access, end-stage renal disease and hemodialysis. Recent cardiac arrest. EXAM: FLUOROSCOPIC AND ULTRASOUND GUIDED PLACEMENT OF A NON TUNNELED VENOUS CATHETER Physician: Stephan Minister. Henn, MD MEDICATIONS: None ANESTHESIA/SEDATION: None FLUOROSCOPY TIME:  Fluoroscopy Time: 1 minutes and 18 seconds COMPLICATIONS: None immediate. PROCEDURE: Informed consent was obtained for catheter placement. The patient was placed supine on the interventional table. Ultrasound confirmed a patent left external jugular vein. Ultrasound images were obtained for documentation. The left neck was prepped and draped in a sterile fashion. The left neck was anesthetized with 1% lidocaine. Maximal barrier sterile technique was utilized including caps, mask, sterile gowns, sterile gloves, sterile drape, hand hygiene and skin antiseptic. A 21 gauge needle directed into the left external jugular vein with ultrasound guidance. A micropuncture dilator set was placed. Central venogram was performed. Dual lumen Power PICC line was selected. Catheter was cut to 8 cm. A peel-away sheath was placed over a 0.018 wire. The Power PICC was advanced through the peel-away sheath and positioned in the left innominate vein. Both lumens aspirated and flushed well. Both lumens were flushed with saline, capped and clamped. Catheter was sutured to skin.  Dressing was placed over the catheter. Fluoroscopic and ultrasound images were taken and saved for documentation. FINDINGS: Patent left external jugular vein. Central venogram demonstrates obstruction of the central left innominate vein with extensive collateral flow into the SVC. Tip of the venous catheter was placed in what appears to be the left innominate vein. IMPRESSION: Placement of a left external jugular venous catheter. Catheter tip in the region of the left innominate vein. 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 Electronically Signed   By: Markus Daft M.D.   On: 08/19/2019 18:20   IR US Guide Vasc Access Left  Result Date: 08/19/2019 INDICATION: 54 year old with poor venous access, end-stage renal disease and hemodialysis. Recent cardiac arrest. EXAM: FLUOROSCOPIC AND ULTRASOUND GUIDED PLACEMENT OF A NON TUNNELED VENOUS CATHETER Physician: Stephan Minister. Henn, MD MEDICATIONS: None ANESTHESIA/SEDATION: None FLUOROSCOPY TIME:  Fluoroscopy Time: 1 minutes and 18 seconds COMPLICATIONS: None immediate. PROCEDURE: Informed consent was obtained for catheter placement. The patient was placed supine on the interventional table. Ultrasound confirmed a patent left external jugular vein. Ultrasound images were obtained for documentation. The left neck was prepped and draped in a sterile fashion. The left neck was anesthetized with 1% lidocaine. Maximal barrier sterile technique was utilized including caps, mask, sterile gowns, sterile gloves, sterile drape, hand hygiene and skin antiseptic. A 21 gauge needle directed into the left external jugular vein with ultrasound guidance. A micropuncture dilator set was placed. Central venogram was performed. Dual lumen Power PICC line was selected. Catheter was cut to 8 cm. A peel-away sheath was placed over a 0.018 wire. The Power PICC was advanced through the peel-away sheath and positioned in the left innominate vein. Both lumens aspirated and flushed well. Both  lumens were flushed with saline, capped and clamped. Catheter was sutured to skin. Dressing was placed over the catheter. Fluoroscopic and ultrasound images were taken and saved for  documentation. FINDINGS: Patent left external jugular vein. Central venogram demonstrates obstruction of the central left innominate vein with extensive collateral flow into the SVC. Tip of the venous catheter was placed in what appears to be the left innominate vein. IMPRESSION: Placement of a left external jugular venous catheter. Catheter tip in the region of the left innominate vein. 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 Electronically Signed   By: Markus Daft M.D.   On: 08/19/2019 18:20   DG Chest Port 1 View  Result Date: 08/17/2019 CLINICAL DATA:  Recent cardiac arrest, check endotracheal tube placement EXAM: PORTABLE CHEST 1 VIEW COMPARISON:  08/15/2018 FINDINGS: Endotracheal tube and gastric catheter are noted in satisfactory position. Femoral dialysis catheter is noted in satisfactory position. Mild atelectatic changes are noted in the left base. No effusion is seen. The right subclavian venous stent and brachial venous stent are noted. IMPRESSION: Mild left basilar atelectasis. Tubes and lines as described Electronically Signed   By: Inez Catalina M.D.   On: 08/17/2019 02:21   DG Chest Port 1 View  Result Date: 08/16/2019 CLINICAL DATA:  Respiratory failure. EXAM: PORTABLE CHEST 1 VIEW COMPARISON:  02/20/2018 FINDINGS: The endotracheal tube is 5.5 cm above the carina. The NG tube is coursing down the esophagus and into the stomach. A central venous catheter is noted in the right atrium coming up from the IVC. Vascular stents are noted. Aortic calcifications are stable. The heart is normal in size. Streaky left basilar atelectasis but overall improved lung aeration since prior study. IMPRESSION: 1. Stable support apparatus. 2. Improved lung aeration with residual streaky left basilar atelectasis.  Electronically Signed   By: Marijo Sanes M.D.   On: 08/16/2019 07:07   DG CHEST PORT 1 VIEW  Result Date: 07/17/2019 CLINICAL DATA:  Endotracheal tube placement. EXAM: PORTABLE CHEST 1 VIEW COMPARISON:  Earlier same day FINDINGS: Endotracheal tube tip 5.5 cm from the carina. Orogastric tube enters the abdomen. Femoral venous catheter tip in the right atrium. Slight worsening of density in the mid and lower lungs left more than right could be edema, pneumonia or atelectasis. Upper lungs remain clear. IMPRESSION: Endotracheal tube tip 5.5 cm above the carina. Worsened bilateral lower lung density left more than right could be edema, pneumonia or atelectasis. Electronically Signed   By: Nelson Chimes M.D.   On: 07/18/2019 14:32   DG Chest Port 1 View  Result Date: 08/15/2019 CLINICAL DATA:  Post intubation EXAM: PORTABLE CHEST 1 VIEW COMPARISON:  Radiograph February 20, 2018 FINDINGS: Endotracheal tube terminates in the mid trachea, 5 cm from the carina. Transesophageal tube tip and side port distal to the GE junction, terminating below the level of imaging. Telemetry leads and pacer pads overlie the chest. Some bandlike opacity in the left mid lung likely reflect subsegmental atelectasis. No consolidation, features of edema, pneumothorax, or effusion. The cardiomediastinal contours are unremarkable. No acute osseous or soft tissue abnormality. IMPRESSION: Lines and tubes as above. Subsegmental atelectasis in the left mid lung. No other acute cardiopulmonary abnormality. Electronically Signed   By: Lovena Le M.D.   On: 07/23/2019 01:59   DG Abd Portable 1V  Result Date: 08/18/2019 CLINICAL DATA:  OG tube placement EXAM: PORTABLE ABDOMEN - 1 VIEW COMPARISON:  08/16/2019 FINDINGS: The tip of the OG tube projects over the gastric body. There is a femoral approach dialysis catheter that appears well position. The stomach is distended. The bowel gas pattern is otherwise nonspecific. IMPRESSION: Enteric tube  terminates over the  stomach. Electronically Signed   By: Constance Holster M.D.   On: 08/18/2019 00:21   DG Abd Portable 1V  Result Date: 08/16/2019 CLINICAL DATA:  Enteric tube placement EXAM: PORTABLE ABDOMEN - 1 VIEW COMPARISON:  None. FINDINGS: Enteric tube terminates within the body of the stomach. Femoral approach central line tip overlies the right atrium. Visualized bowel gas pattern is unremarkable. IMPRESSION: Enteric tube terminates within the stomach. Electronically Signed   By: Macy Mis M.D.   On: 08/16/2019 16:34   ECHOCARDIOGRAM COMPLETE  Result Date: 08/13/2019   ECHOCARDIOGRAM REPORT   Patient Name:   [name] Date of Exam: 08/13/2019 Medical Rec #:  [id number]         Height:       69.0 in Accession #:    [id number]        Weight:       193.8 lb Date of Birth:  04-18-1966         BSA:          2.04 m Patient Age:    89 years          BP:           92/66 mmHg Patient Gender: M                 HR:           97 bpm. Exam Location:  Inpatient Procedure: 2D Echo, Cardiac Doppler and Color Doppler Indications:    Cardiac Arrest 427.5  History:        Patient has no prior history of Echocardiogram examinations.  Sonographer:    Jonelle Sidle Dance Referring Phys: [id number] PAULA B SIMPSON  Sonographer Comments: Echo performed with patient supine and on artificial respirator. Image acquisition challenging due to respiratory motion. IMPRESSIONS  1. Left ventricular ejection fraction, by visual estimation, is 30 to 35%. The left ventricle has moderately decreased function. There is mildly increased left ventricular hypertrophy.  2. Left ventricular diastolic parameters are consistent with Grade I diastolic dysfunction (impaired relaxation).  3. The left ventricle demonstrates global hypokinesis.  4. Global right ventricle has mildly reduced systolic function.The right ventricular size is normal. No increase in right ventricular wall thickness.  5. Left atrial size was normal.  6. Right atrial size  was normal.  7. The pericardial effusion is circumferential.  8. Trivial pericardial effusion is present.  9. The mitral valve is normal in structure. No evidence of mitral valve regurgitation. 10. The tricuspid valve is normal in structure. 11. The tricuspid valve is normal in structure. Tricuspid valve regurgitation is trivial. 12. The aortic valve is normal in structure. Aortic valve regurgitation is not visualized. 13. The pulmonic valve was grossly normal. Pulmonic valve regurgitation is not visualized. 14. Normal pulmonary artery systolic pressure. 15. The tricuspid regurgitant velocity is 2.32 m/s, and with an assumed right atrial pressure of 3 mmHg, the estimated right ventricular systolic pressure is normal at 24.5 mmHg. 16. The inferior vena cava is normal in size with greater than 50% respiratory variability, suggesting right atrial pressure of 3 mmHg. 17. No significant change from prior study. 18. Prior images reviewed side by side. FINDINGS  Left Ventricle: Left ventricular ejection fraction, by visual estimation, is 30 to 35%. The left ventricle has moderately decreased function. The left ventricle demonstrates global hypokinesis. The left ventricular internal cavity size was the left ventricle is normal in size. There is mildly increased left ventricular hypertrophy. Concentric left ventricular hypertrophy. Left ventricular  diastolic parameters are consistent with Grade I diastolic dysfunction (impaired relaxation). Right Ventricle: The right ventricular size is normal. No increase in right ventricular wall thickness. Global RV systolic function is has mildly reduced systolic function. The tricuspid regurgitant velocity is 2.32 m/s, and with an assumed right atrial pressure of 3 mmHg, the estimated right ventricular systolic pressure is normal at 24.5 mmHg. Left Atrium: Left atrial size was normal in size. Right Atrium: Right atrial size was normal in size Pericardium: Trivial pericardial effusion is  present. The pericardial effusion is circumferential. Mitral Valve: The mitral valve is normal in structure. No evidence of mitral valve regurgitation. Tricuspid Valve: The tricuspid valve is normal in structure. Tricuspid valve regurgitation is trivial. Aortic Valve: The aortic valve is normal in structure. Aortic valve regurgitation is not visualized. Pulmonic Valve: The pulmonic valve was grossly normal. Pulmonic valve regurgitation is not visualized. Pulmonic regurgitation is not visualized. Aorta: The aortic root and ascending aorta are structurally normal, with no evidence of dilitation. Venous: The inferior vena cava is normal in size with greater than 50% respiratory variability, suggesting right atrial pressure of 3 mmHg. IAS/Shunts: No atrial level shunt detected by color flow Doppler. Additional Comments: Little change from 06/08/2019 (note different MRN).  LEFT VENTRICLE PLAX 2D LVIDd:         4.20 cm  Diastology LVIDs:         3.90 cm  LV e' lateral:   5.78 cm/s LV PW:         1.30 cm  LV E/e' lateral: 7.5 LV IVS:        1.20 cm  LV e' medial:    5.78 cm/s LVOT diam:     2.10 cm  LV E/e' medial:  7.5 LV SV:         13 ml LV SV Index:   6.06 LVOT Area:     3.46 cm  RIGHT VENTRICLE            IVC RV Basal diam:  2.10 cm    IVC diam: 1.40 cm RV S prime:     8.24 cm/s TAPSE (M-mode): 1.6 cm LEFT ATRIUM           Index       RIGHT ATRIUM          Index LA diam:      2.70 cm 1.32 cm/m  RA Area:     9.52 cm LA Vol (A4C): 26.9 ml 13.20 ml/m RA Volume:   18.20 ml 8.93 ml/m  AORTIC VALVE LVOT Vmax:   80.20 cm/s LVOT Vmean:  53.900 cm/s LVOT VTI:    0.126 m  AORTA Ao Root diam: 3.50 cm Ao Asc diam:  2.50 cm MITRAL VALVE                        TRICUSPID VALVE MV Area (PHT): 3.53 cm             TR Peak grad:   21.5 mmHg MV PHT:        62.35 msec           TR Vmax:        232.00 cm/s MV Decel Time: 215 msec MV E velocity: 43.30 cm/s 103 cm/s  SHUNTS MV A velocity: 59.60 cm/s 70.3 cm/s Systemic VTI:  0.13 m MV  E/A ratio:  0.73       1.5       Systemic Diam: 2.10 cm  Sanda Klein MD Electronically signed by Sanda Klein MD Signature Date/Time: 08/13/2019/2:38:10 PM    Final    [place] EKG SITE RITE  Result Date: 08/18/2019 If Site Rite image not attached, placement could not be confirmed due to current cardiac rhythm.    Micro Data:   Results for orders placed or performed during the hospital encounter of 07/17/2019  Respiratory Panel by RT PCR (Flu A&B, Covid) - Nasopharyngeal Swab     Status: None   Collection Time: 07/31/2019  1:46 AM   Specimen: Nasopharyngeal Swab  Result Value Ref Range Status   SARS Coronavirus 2 by RT PCR NEGATIVE NEGATIVE Final    Comment: (NOTE) SARS-CoV-2 target nucleic acids are NOT DETECTED. The SARS-CoV-2 RNA is generally detectable in upper respiratoy specimens during the acute phase of infection. The lowest concentration of SARS-CoV-2 viral copies this assay can detect is 131 copies/mL. A negative result does not preclude SARS-Cov-2 infection and should not be used as the sole basis for treatment or other patient management decisions. A negative result may occur with  improper specimen collection/handling, submission of specimen other than nasopharyngeal swab, presence of viral mutation(s) within the areas targeted by this assay, and inadequate number of viral copies (<131 copies/mL). A negative result must be combined with clinical observations, patient history, and epidemiological information. The expected result is Negative. Fact Sheet for Patients:  PinkCheek.be Fact Sheet for Healthcare Providers:  GravelBags.it This test is not yet ap proved or cleared by the [place] FDA and  has been authorized for detection and/or diagnosis of SARS-CoV-2 by FDA under an Emergency Use Authorization (EUA). This EUA will remain  in effect (meaning this test can be used) for the duration of the COVID-19  declaration under Section 564(b)(1) of the Act, 21 U.S.C. section 360bbb-3(b)(1), unless the authorization is terminated or revoked sooner.    Influenza A by PCR NEGATIVE NEGATIVE Final   Influenza B by PCR NEGATIVE NEGATIVE Final    Comment: (NOTE) The Xpert Xpress SARS-CoV-2/FLU/RSV assay is intended as an aid in  the diagnosis of influenza from Nasopharyngeal swab specimens and  should not be used as a sole basis for treatment. Nasal washings and  aspirates are unacceptable for Xpert Xpress SARS-CoV-2/FLU/RSV  testing. Fact Sheet for Patients: PinkCheek.be Fact Sheet for Healthcare Providers: GravelBags.it This test is not yet approved or cleared by the [place] FDA and  has been authorized for detection and/or diagnosis of SARS-CoV-2 by  FDA under an Emergency Use Authorization (EUA). This EUA will remain  in effect (meaning this test can be used) for the duration of the  Covid-19 declaration under Section 564(b)(1) of the Act, 21  U.S.C. section 360bbb-3(b)(1), unless the authorization is  terminated or revoked. Performed at [facility], [place] [address] [place], [place] [address]   MRSA PCR Screening     Status: Abnormal   Collection Time: 08/05/2019  7:35 AM   Specimen: Nasal Mucosa; Nasopharyngeal  Result Value Ref Range Status   MRSA by PCR POSITIVE (A) NEGATIVE Final    Comment:        The GeneXpert MRSA Assay (FDA approved for NASAL specimens only), is one component of a comprehensive MRSA colonization surveillance program. It is not intended to diagnose MRSA infection nor to guide or monitor treatment for MRSA infections. RESULT CALLED TO, READ BACK BY AND VERIFIED WITH: Mikal Plane RN 9:40 07/25/2019 ([email or web address]) Performed at [facility], [place] [address] [place], [place] [address]  Culture, respiratory (non-expectorated)     Status: None   Collection Time: 08/11/2019  7:38 AM   Specimen:  Tracheal Aspirate; Respiratory  Result Value Ref Range Status   Specimen Description TRACHEAL ASPIRATE  Final   Special Requests NONE  Final   Gram Stain   Final    FEW WBC PRESENT, PREDOMINANTLY PMN MODERATE GRAM POSITIVE COCCI IN PAIRS IN CLUSTERS MODERATE GRAM NEGATIVE RODS RARE GRAM NEGATIVE COCCOBACILLI Performed at [facility], [place] [address] [place], [place] [address]    Culture   Final    MODERATE METHICILLIN RESISTANT STAPHYLOCOCCUS AUREUS   Report Status 08/15/2019 FINAL  Final   Organism ID, Bacteria METHICILLIN RESISTANT STAPHYLOCOCCUS AUREUS  Final      Susceptibility   Methicillin resistant staphylococcus aureus - MIC*    CIPROFLOXACIN >=8 RESISTANT Resistant     ERYTHROMYCIN >=8 RESISTANT Resistant     GENTAMICIN <=0.5 SENSITIVE Sensitive     OXACILLIN >=4 RESISTANT Resistant     TETRACYCLINE <=1 SENSITIVE Sensitive     VANCOMYCIN 1 SENSITIVE Sensitive     TRIMETH/SULFA <=10 SENSITIVE Sensitive     CLINDAMYCIN <=0.25 SENSITIVE Sensitive     RIFAMPIN <=0.5 SENSITIVE Sensitive     Inducible Clindamycin NEGATIVE Sensitive     * MODERATE METHICILLIN RESISTANT STAPHYLOCOCCUS AUREUS  Culture, blood (routine x 2)     Status: None   Collection Time: 07/18/2019  9:46 AM   Specimen: BLOOD RIGHT ARM  Result Value Ref Range Status   Specimen Description BLOOD RIGHT ARM  Final   Special Requests   Final    AEROBIC BOTTLE ONLY Blood Culture results may not be optimal due to an inadequate volume of blood received in culture bottles Performed at [facility], [address] [place], [place] [address]    Culture NO GROWTH 5 DAYS  Final   Report Status 08/17/2019 FINAL  Final  Culture, blood (routine x 2)     Status: None   Collection Time: 08/15/2019  9:47 AM   Specimen: BLOOD  Result Value Ref Range Status   Specimen Description BLOOD RIGHT ANTECUBITAL  Final   Special Requests   Final    AEROBIC BOTTLE ONLY Blood Culture results may not be optimal due to an  inadequate volume of blood received in culture bottles Performed at [facility], [place] [address] [place], [place] [address]    Culture NO GROWTH 5 DAYS  Final   Report Status 08/17/2019 FINAL  Final   Antimicrobials:   Antibiotics Given (last 72 hours)    Date/Time Action Medication Dose Rate   08/17/19 1144 New Bag/Given   vancomycin (VANCOREADY) IVPB 750 mg/150 mL 750 mg 150 mL/hr   08/18/19 1221 New Bag/Given   vancomycin (VANCOREADY) IVPB 750 mg/150 mL 750 mg 150 mL/hr   08/19/19 1159 New Bag/Given   vancomycin (VANCOREADY) IVPB 750 mg/150 mL 750 mg 150 mL/hr     Interim history/subjective:  Patient remains somnolent, required minimal pressors for BP, otherwise vitals stable and patient slowly improving.   Objective   Blood pressure 136/77, pulse 87, temperature 98.5 F (36.9 C), temperature source Axillary, resp. rate 19, height 5\' 9"  (1.753 m), weight 91.7 kg, SpO2 100 %.        Intake/Output Summary (Last 24 hours) at 08/20/2019 1124 Last data filed at 08/20/2019 1000 Gross per 24 hour  Intake 737.57 ml  Output 1100 ml  Net -362.43 ml   [employer]  08/19/19 0500 08/20/19 0500 08/20/19 0730  Weight: 91.8 kg 91.6 kg 91.7 kg    Examination: General: Awake, alert, sleepy HENT: Normocephalic, atraumatic, EOMI, PERRLA Lungs: Bilateral lower lobe crackles appreciated, normal work of breathing, satting 92% on 2L [place] Cardiovascular: RRR, clear S1-S2 present, no murmurs or gallops appreciated Abdomen: Soft, nondistended, normal bowel sounds appreciated, nontender to palpation Extremities: Left BKA, calciphylaxis appreciated to anterior lateral left thigh, wound dressing in place, no purulent drainage or bleeding appreciated; right lower extremity with 1+ pitting edema Neuro: Somnolent, no focal deficits, cranial nerves II-XII grossly intact  Resolved Hospital Problem list   Cardiac arrest Prolonged QTC Acute hypoxic respiratory failure  Assessment & Plan:    Multifactorial Shock, likely cardiogenic with superimposed septic shock, improved: Patient stable on minimal pressors.  Blood cultures from 1/28negative, completed Zosyn course.  Today white cell count increased 13.6 > 19.7 (2/5).  Blood pressure low 70/49, but improved with Levophed (most recently 136/77).  - Plan to wean Levophed 2/5, if patient vitals stable off of Levophed can be transferred to floor - Today is last day for vancomycin for MRSA pneumonia(01/29 - 02/05) - Will likely need to defer volume removal given his pressor needs  MRSA pneumonia, improving:Patient also with recent history ofpulmonary edema and HFrEF - No longer intubated or mechanically ventilated -Volume removal when he can tolerate hemodynamically -Continuous pulse ox monitoring  Difficult venous access: Due to history of vasculopathy it is difficult to obtain decent IV access in this patient.  Patient currently has left groin triple-lumen catheter that can be infused but RN has been unable to aspirate.  Patient also has tunneled HD catheter to right groin currently being used for dialysis (placed by Dr. Lynetta Mare 07/30/2019).  Attempted bedside EJ line placement performed 2/4, was unsuccessful.  PICC team consulted, decline placement of PICC line due to patient's bilateral upper extremity fistulae from previous HD access. -IR consulted, placed left EJ line 2/4  Acute Encephalopathy, improving: Patient is s/p cardiac arrest with seizure-like activity treated with midazolam, now resolved. Remains somnolent at baseline. -Frequent neurochecks, continue to monitor mental status -Begin to wean sedating medications as he can tolerate.  Calciphylaxis, chronic, stable:  Patient denies significant pain from his calciphylaxis this morning and 2/4.  Adequate pain control with Dilaudid, gabapentin. -Continue gabapentin200mg  BID - Added Cymbalta 20 mg daily 2/3 - Discontinue clonazepam 1mg  due to sleepiness -  Dilaudid 4mg  q6 made as needed - Continue Sodium Thiosulfate 25mg  IV qMWF  ESRDon HD MWF: HD 2/5 - Nephrology consulted - appreciate recommendations  Anemia of Chronic Disease, chronic, stable:Hgb 8.6> 9.3. Transfused 2 units RBC 01/28-01/29 and 2 more on 2/2.. - f/u AM CBC 2/5 -Transfuse Hgb <7 with RBC. - Aranesp IV 140mcg every Saturday  Type 2Diabetes Mellitus:Blood sugar readings more stable 113-168 over the last 24 hours.  Patient asymptomatic. - mSSI  - Levemir 8 units BID - CBGs q4  Best practice:  Diet: Renal/carb modified with fluid restriction Pain/Anxiety/Delirium protocol (if indicated): Dilaudid 4 mg every 6 as needed, Cymbalta 20 mg, gabapentin 200 mg nightly DVT prophylaxis: Heparin GI prophylaxis: None at this time Glucose control: Levemir 8 units twice daily, moderate sliding scale insulin Mobility: Limited secondary to pain, left BKA Code Status: DNR Family Communication: None Disposition: Transfer to floor once stable  Labs   CBC: Recent Labs  Lab 08/17/19 1700 08/18/19 0341 08/18/19 0615 08/19/19 1805 08/20/19 0345  WBC 19.4* 17.2* 14.4* 13.6* 19.7*  HGB 10.0* 10.7* 10.5* 8.6*  9.3*  HCT 33.3* 35.8* 34.7* 28.6* 31.6*  MCV 90.5 89.9 90.6 92.6 93.5  PLT 186 204 173 161 [id number]    Basic Metabolic Panel: Recent Labs  Lab 08/18/19 0341 08/18/19 0341 08/18/19 0615 08/18/19 0615 08/18/19 1817 08/18/19 1817 08/19/19 0500 08/19/19 1806 08/19/19 2228 08/20/19 0345 08/20/19 0730 08/20/19 0911  NA 138   < > 139   < > 142  --  144 137 139  --  139  --   K 2.3*   < > 3.3*   < > 3.5  --  3.9 4.3 4.0  --  4.2  --   CL 100   < > 101   < > 108  --  105 100 102  --  100  --   CO2 25   < > 23   < > 18*  --  20* 18* 17*  --  17*  --   GLUCOSE 212*   < > 187*   < > 386*   < > 56* 168* 160*  --  133* 113*  BUN 26*   < > 38*   < > 28*  --  40* 50* 50*  --  57*  --   CREATININE 1.57*   < > 2.51*   < > 1.84*  --  2.92* 3.66* 3.77*  --  3.87*  --    CALCIUM 8.0*   < > 8.0*   < > 6.9*  --  8.0*  7.4* 7.4* 7.5*  --  8.0*  --   MG 2.5*  --  2.6*  --   --   --  2.6*  --  2.5* 2.4  --   --   PHOS 1.7*   < > 2.8  --  2.1*  --  3.5 3.9  --   --  4.7*  --    < > = values in this interval not displayed.   GFR: Estimated Creatinine Clearance: 24.1 mL/min (A) (by C-G formula based on SCr of 3.87 mg/dL (H)). Recent Labs  Lab 08/13/19 1439 08/15/19 1439 08/18/19 0341 08/18/19 0615 08/19/19 1805 08/20/19 0345  WBC  --    < > 17.2* 14.4* 13.6* 19.7*  LATICACIDVEN 1.8  --   --   --   --   --    < > = values in this interval not displayed.    Liver Function Tests: Recent Labs  Lab 08/18/19 0615 08/18/19 1817 08/19/19 0500 08/19/19 1806 08/20/19 0730  ALBUMIN 1.9* 1.7* 1.8* 1.7* 2.0*   No results for input(s): LIPASE, AMYLASE in the last 168 hours. No results for input(s): AMMONIA in the last 168 hours.  ABG    Component Value Date/Time   PHART 7.422 08/16/2019 1520   PCO2ART 33.7 08/16/2019 1520   PO2ART 177.0 (H) 08/16/2019 1520   HCO3 21.9 08/16/2019 1520   TCO2 23 08/16/2019 1520   ACIDBASEDEF 2.0 08/16/2019 1520   O2SAT 100.0 08/16/2019 1520     Coagulation Profile: No results for input(s): INR, PROTIME in the last 168 hours.  Cardiac Enzymes: No results for input(s): CKTOTAL, CKMB, CKMBINDEX, TROPONINI in the last 168 hours.  HbA1C: Hgb A1c MFr Bld  Date/Time Value Ref Range Status  08/11/2019 07:30 PM 5.6 4.8 - 5.6 % Final    Comment:    (NOTE) Pre diabetes:          5.7%-6.4% Diabetes:              >6.4%  Glycemic control for   <7.0% adults with diabetes     CBG: Recent Labs  Lab 08/19/19 1613 08/19/19 2034 08/19/19 2330 08/20/19 0603 08/20/19 0828  GLUCAP 18* 136* 164* 113* 111*    Review of Systems:   Endorses pain to left buttock, left thigh; fatigue, drowsiness Denies headache, fever, chest pain  Surgical History    Past Surgical History:  Procedure Laterality Date  . IR FLUORO GUIDE  CV LINE LEFT  08/19/2019  . IR US GUIDE VASC ACCESS LEFT  08/19/2019     Social History   reports that he has been smoking cigarettes. He has never used smokeless tobacco. He reports current alcohol use. He reports current drug use. Drug: Marijuana. None  Family History   His family history is not on file.   Allergies Allergies  Allergen Reactions  . Amlodipine Itching  . Dacarbazine And Related Nausea And Vomiting  . Fentanyl Itching  . Hydrocodone Nausea And Vomiting  . Propofol Itching     Home Medications  Prior to Admission medications   Medication Sig Start Date End Date Taking? Authorizing Provider  b complex-vitamin c-folic acid (NEPHRO-VITE) 0.8 MG TABS tablet Take 1 tablet by mouth daily. 05/18/19   [provider]  calcium acetate (PHOSLO) 667 MG tablet Take 1,334 mg by mouth 3 (three) times daily. 07/21/19   [provider]  COLCRYS 0.6 MG tablet Take 0.6 mg by mouth daily as needed (gout flare).  03/24/19   [provider]  hydrALAZINE (APRESOLINE) 25 MG tablet Take 37.5 mg by mouth 3 (three) times daily. 03/29/19   [provider]  isosorbide mononitrate (IMDUR) 30 MG 24 hr tablet Take 30 mg by mouth daily. 03/29/19   [provider]  LEVEMIR FLEXTOUCH 100 UNIT/ML Pen Inject 25 Units into the skin daily. 07/30/19   [provider]  metoprolol succinate (TOPROL-XL) 50 MG 24 hr tablet Take 50 mg by mouth daily. 03/29/19   [provider]  NOVOLIN 70/30 FLEXPEN (70-30) 100 UNIT/ML PEN Inject 20 Units into the skin daily. 03/11/19   [provider]  NOVOLOG FLEXPEN 100 UNIT/ML FlexPen Inject 1 Units into the skin at bedtime. 07/30/19   [provider]  oxyCODONE-acetaminophen (PERCOCET) 7.5-325 MG tablet Take 1 tablet by mouth every 4 (four) hours as needed. 07/26/19   [provider]  oxyCODONE-acetaminophen (PERCOCET/ROXICET) 5-325 MG tablet Take 1 tablet by mouth 4 (four) times daily as needed.  07/19/19   [provider]  pantoprazole (PROTONIX) 40 MG tablet Take 40 mg by mouth daily. 06/23/19   [provider]  RENVELA 800 MG tablet Take 2,400 mg by mouth 3 (three) times daily. 03/11/19   [provider]  rosuvastatin (CRESTOR) 5 MG tablet Take 5 mg by mouth daily. 03/11/19   [provider]  TRULICITY [id number] SOPN Inject 1 mg into the skin once a week. 07/30/19   [provider]  VELPHORO 500 MG chewable tablet Chew 500 mg by mouth 3 (three) times daily. 06/08/19   [provider]  VENTOLIN HFA 108 (90 Base) MCG/ACT inhaler Inhale 2 puffs into the lungs 4 (four) times daily as needed for wheezing or shortness of breath. 06/23/19   [provider]     Critical care time: [place], [place], PGY-2 08/20/2019 11:24 AM

## 2019-08-20 NOTE — Plan of Care (Signed)
  Problem: Clinical Measurements: Goal: Ability to maintain clinical measurements within normal limits will improve Outcome: Progressing Goal: Diagnostic test results will improve Outcome: Progressing Goal: Respiratory complications will improve Outcome: Progressing Goal: Cardiovascular complication will be avoided Outcome: Progressing   Problem: Coping: Goal: Level of anxiety will decrease Outcome: Progressing   Problem: Pain Managment: Goal: General experience of comfort will improve Outcome: Progressing

## 2019-08-21 LAB — GLUCOSE, RANDOM
Glucose, Bld: 135 mg/dL — ABNORMAL HIGH (ref 70–99)
Glucose, Bld: 105 mg/dL — ABNORMAL HIGH (ref 70–99)

## 2019-08-21 LAB — CBC
HCT: 26.2 % — ABNORMAL LOW (ref 39.0–52.0)
Hemoglobin: 8 g/dL — ABNORMAL LOW (ref 13.0–17.0)
MCH: 28.2 pg (ref 26.0–34.0)
MCHC: 30.5 g/dL (ref 30.0–36.0)
MCV: 92.3 fL (ref 80.0–100.0)
Platelets: 200 10*3/uL (ref 150–400)
RBC: 2.84 MIL/uL — ABNORMAL LOW (ref 4.22–5.81)
RDW: 21.7 % — ABNORMAL HIGH (ref 11.5–15.5)
WBC: 13.7 10*3/uL — ABNORMAL HIGH (ref 4.0–10.5)
nRBC: 0.3 % — ABNORMAL HIGH (ref 0.0–0.2)

## 2019-08-21 LAB — BASIC METABOLIC PANEL
Anion gap: 19 — ABNORMAL HIGH (ref 5–15)
BUN: 42 mg/dL — ABNORMAL HIGH (ref 6–20)
CO2: 19 mmol/L — ABNORMAL LOW (ref 22–32)
Calcium: 7.4 mg/dL — ABNORMAL LOW (ref 8.9–10.3)
Chloride: 102 mmol/L (ref 98–111)
Creatinine, Ser: 3.62 mg/dL — ABNORMAL HIGH (ref 0.61–1.24)
GFR calc Af Amer: 21 mL/min — ABNORMAL LOW (ref >60)
GFR calc non Af Amer: 18 mL/min — ABNORMAL LOW (ref >60)
Glucose, Bld: 123 mg/dL — ABNORMAL HIGH (ref 70–99)
Potassium: 4.2 mmol/L (ref 3.5–5.1)
Sodium: 140 mmol/L (ref 135–145)

## 2019-08-21 LAB — GLUCOSE, CAPILLARY
Glucose-Capillary: 129 mg/dL — ABNORMAL HIGH (ref 70–99)
Glucose-Capillary: 138 mg/dL — ABNORMAL HIGH (ref 70–99)
Glucose-Capillary: 168 mg/dL — ABNORMAL HIGH (ref 70–99)
Glucose-Capillary: 95 mg/dL (ref 70–99)
Glucose-Capillary: 99 mg/dL (ref 70–99)
Glucose-Capillary: 99 mg/dL (ref 70–99)

## 2019-08-21 LAB — MAGNESIUM: Magnesium: 2.2 mg/dL (ref 1.7–2.4)

## 2019-08-21 MED ORDER — COSYNTROPIN 0.25 MG IJ SOLR
0.2500 mg | Freq: Once | INTRAMUSCULAR | Status: AC
  Administered 2019-08-22: 0.25 mg via INTRAVENOUS
  Filled 2019-08-21: qty 0.25

## 2019-08-21 MED ORDER — ALBUMIN HUMAN 25 % IV SOLN
25.0000 g | Freq: Once | INTRAVENOUS | Status: AC
  Administered 2019-08-21: 25 g via INTRAVENOUS
  Filled 2019-08-21: qty 50

## 2019-08-21 NOTE — Progress Notes (Signed)
[facility] Progress Note  Subjective: Tol HD yesterday but was on NE gtt and remains on at 8.  He has no complaints this am PCCM attempting to wean pressors.  Vitals:   08/21/19 0845 08/21/19 0900 08/21/19 0915 08/21/19 0930  BP: (!) 108/57 (!) 87/50 (!) 103/54 (!) 84/44  Pulse:      Resp: 16 (!) 5 15 13   Temp:      TempSrc:      SpO2: 95% 98% 97% (!) 87%  Weight:      Height:        Exam: General: awake, alert, comfortable HEENT: MMM Eyes: PERRL Cor: RRR no RG Lungs: Coarse BS, normal WOB Abdomen: Soft obese Extremities: trace edema  Skin: large necrotic wound left lateral upper leg w/ sig tissue loss; distal L BKA stump tissue necrotic also penis gangrene Neuro: responding to conversation appropriately.  Access right femoral TDC in place with dressing c/d/i  [place] MWF   R fem TDC   450/800  2/2 bath  86kg   -mircera 200 mcg every 2 weeks last on 1/16 -Sodium thiosulfate 25 gram every treatment  -Weekly venofer -parsabiv 2.5 mg three times a week    CXR 1/28 - no active disease  Assessment/Plan:  # Cardiac arrest/ found down - Supportive care per pulm critical care--> improved  # Acute hypoxic respiratory failure - extubated to [place] now  # End-stage renal disease - Normally reported as MWF schedule  - CRRT started 1/28 and off 2/3 PM - iHD 2/5 - tolerated OK despite quite low BP on cuff.  Despite midodrine 10 TID he is still requiring NE gtt at low dose.  I'm concerned that this will be an ongoing issue and may limit his ability to receive ongoing dialytic therapies outpt. I think part of the issue is that it's difficult to obtain an accurate cuff BP as he remains completely asymptomatic with SBP in 70s.  # Shock - likely septic, MRSA growing resp cx, bcx's negative on IV abx - Improved but still on low dose pressor support + midodrine 10 TID; per above. -Completed course of vanc 2/5  # Anemia acute on chronic - Status post PRBCs - last  mircera was on 1/30 at 150 mcg -Hb 8 now.   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   [name] [facility]   Inpatient medications: . Chlorhexidine Gluconate Cloth  6 each Topical Daily  . Chlorhexidine Gluconate Cloth  6 each Topical Q0600  . cinacalcet  30 mg Oral Q breakfast  . darbepoetin (ARANESP) injection - DIALYSIS  150 mcg Intravenous Q Sat-HD  . DULoxetine  20 mg Oral Daily  . feeding supplement (NEPRO CARB STEADY)  237 mL Oral TID BM  . feeding supplement (PRO-STAT SUGAR FREE 64)  30 mL Oral BID  . gabapentin  200 mg Oral QHS  . insulin aspart  0-15 Units Subcutaneous Q6H  . insulin detemir  8 Units Subcutaneous BID  . midodrine  10 mg Oral TID WC  . multivitamin  1 tablet Oral QHS  . sodium chloride flush  10-40 mL Intracatheter  Q12H   . sodium chloride    . sodium chloride    . heparin 10,000 units/ 20 mL infusion syringe 2,500 Units/hr (08/18/19 0047)  . norepinephrine (LEVOPHED) Adult infusion 9 mcg/min (08/21/19 0900)  . sodium thiosulfate infusion for calciphylaxis 25 g (08/20/19 1106)   sodium chloride, sodium chloride, albuterol, alteplase, bisacodyl, heparin, heparin, heparin, heparin, HYDROmorphone, hydrOXYzine, influenza vac split quadrivalent PF, lidocaine (PF), lidocaine (PF), lidocaine-prilocaine, pentafluoroprop-tetrafluoroeth, pneumococcal 23 valent vaccine, senna-docusate, sodium chloride flush Recent Labs  Lab 08/19/19 1806 08/19/19 2228 08/20/19 0730 08/20/19 0730 08/20/19 0911 08/21/19 0443  NA 137   < > 139  --   --  140  K 4.3   < > 4.2  --   --  4.2  CL 100   < > 100  --   --  102   CO2 18*   < > 17*  --   --  19*  GLUCOSE 168*   < > 133*   < > 113* 123*  BUN 50*   < > 57*  --   --  42*  CREATININE 3.66*   < > 3.87*  --   --  3.62*  CALCIUM 7.4*   < > 8.0*  --   --  7.4*  PHOS 3.9  --  4.7*  --   --   --    < > = values in this interval not displayed.

## 2019-08-21 NOTE — Progress Notes (Signed)
eLink Physician-Brief Progress Note Patient Name: [name] DOB: [birth year] MRN: [id number]   Date of Service  08/21/2019  HPI/Events of Note  Request for labs  eICU Interventions  BMP ordered     Intervention Category Minor Interventions: Other:  Judd Lien 08/21/2019, 4:42 AM

## 2019-08-21 NOTE — Evaluation (Addendum)
Occupational Therapy Evaluation Patient Details Name: [name] MRN: [id number] DOB: August 19, 1965 Today's Date: 08/21/2019    History of Present Illness 54 y.o. male with history of ESRD on HD with witnessed cardiac arrest, obtained ROSC after 3 minutes.  Required intubation for ongoing airway protection, ongoing unresponsiveness, and hypotension requiring vasopressor support.  Also with MRSA pneumonia on vancomycin.  Patient extubated 08/18/2019, off pressors.   Clinical Impression   PTA patient independent and driving, using prosthetic to L LE.  Patient currently admitted for above and limited by problem list below, including impaired balance, decreased activity tolerance, generalized weakness and impaired cognition. Patient re-oriented to time and situation, following simple commands with increased time but noted difficulty with task initiation, sequencing and problem solving. Patient requires min assist for grooming, mod assist for UB ADLs, max-total assist for LB ADLs, max assist for bed mobility and min guard to mod assist for sitting EOB balance with 1-2 hand support.  Patient fatigues easily; soft BP but VSS. He will benefit from continued OT services while admitted and after dc at SNF level in order to optimize return to PLOF with ADLs, mobility.     Follow Up Recommendations  SNF;Supervision/Assistance - 24 hour    Equipment Recommendations  Other (comment)(TBD at next venue of care)    Recommendations for Other Services       Precautions / Restrictions Precautions Precautions: Fall Precaution Comments: perm R fem HD cath  Restrictions Weight Bearing Restrictions: No Other Position/Activity Restrictions: old L BKA      Mobility Bed Mobility Overal bed mobility: Needs Assistance Bed Mobility: Supine to Sit;Sit to Supine     Supine to sit: Max assist;HOB elevated Sit to supine: Max assist   General bed mobility comments: patient transitioned to EOB with max assist  for BLE and trunk support with multimodal cueing poor initation and sequencing of task; returned to supine then total assist +2 to scoot towards [facility]   Transfers                 General transfer comment: deferred, attempted scooting to [facility] but patient unable to with max assist from therapist      Balance Overall balance assessment: Needs assistance Sitting-balance support: Bilateral upper extremity supported;Single extremity supported;Feet supported Sitting balance-Leahy Scale: Poor Sitting balance - Comments: min guard initally but relaint on at least 1 UE support, fading to mod assist as fatigues  Postural control: Right lateral lean;Posterior lean                                 ADL either performed or assessed with clinical judgement   ADL Overall ADL's : Needs assistance/impaired     Grooming: Minimal assistance;Bed level   Upper Body Bathing: Moderate assistance;Sitting   Lower Body Bathing: Total assistance;Bed level;Sitting/lateral leans   Upper Body Dressing : Moderate assistance;Sitting   Lower Body Dressing: Total assistance;Sitting/lateral leans;Bed level Lower Body Dressing Details (indicate cue type and reason): assist to don R sock, lateral leans EOB with mod assist    Toilet Transfer Details (indicate cue type and reason): deferred due to safety         Functional mobility during ADLs: Maximal assistance(to EOB ) General ADL Comments: patient limited by weakness, impaired cognition, balance      Vision   Vision Assessment?: No apparent visual deficits     Perception     Praxis  Pertinent Vitals/Pain Pain Assessment: Faces Faces Pain Scale: Hurts even more Pain Location: LLE Pain Descriptors / Indicators: Grimacing Pain Intervention(s): Monitored during session;Repositioned;Premedicated before session     Hand Dominance Right   Extremity/Trunk Assessment Upper Extremity Assessment Upper Extremity Assessment: Generalized  weakness(reports B hand numbness/ tingling with decreased coordinatio)   Lower Extremity Assessment Lower Extremity Assessment: Defer to PT evaluation   Cervical / Trunk Assessment Cervical / Trunk Assessment: Normal   Communication Communication Communication: No difficulties   Cognition Arousal/Alertness: Awake/alert Behavior During Therapy: Flat affect Overall Cognitive Status: No family/caregiver present to determine baseline cognitive functioning Area of Impairment: Orientation;Attention;Memory;Following commands;Safety/judgement;Awareness;Problem solving                 Orientation Level: Disoriented to;Situation;Time Current Attention Level: Focused Memory: Decreased recall of precautions;Decreased short-term memory Following Commands: Follows one step commands inconsistently;Follows one step commands with increased time Safety/Judgement: Decreased awareness of safety;Decreased awareness of deficits Awareness: Intellectual Problem Solving: Slow processing;Decreased initiation;Difficulty sequencing;Requires verbal cues General Comments: patient disoriented to time and situation, some decreased awarenes, problem sovling and sequencing (reports january, and I asked what month comes after [place]- pt reporting "June"); slow processing    General Comments  VSS on RA; soft BP: supine 98/54 (67),EOB 105/67 (80), after activity supine 90/71 (79)      Exercises     Shoulder Instructions      Home Living Family/patient expects to be discharged to:: Private residence Living Arrangements: Spouse/significant other;Children Available Help at Discharge: Family;Available 24 hours/day Type of Home: House Home Access: Stairs to enter [employer] of Steps: 5 Entrance Stairs-Rails: Right;Left Home Layout: One level     Bathroom Shower/Tub: Teacher, early years/pre: Standard     Home Equipment: Environmental consultant - 2 wheels   Additional Comments: unclear history, per PT  lives alone in house with a ramp but pt reports above       Prior Functioning/Environment Level of Independence: Independent        Comments: independent with ADLs, IADLs, driving; wears prosthetic to L LE         OT Problem List: Decreased strength;Decreased activity tolerance;Impaired balance (sitting and/or standing);Decreased coordination;Decreased cognition;Decreased safety awareness;Decreased knowledge of use of DME or AE;Decreased knowledge of precautions;Pain      OT Treatment/Interventions: Self-care/ADL training;DME and/or AE instruction;Therapeutic exercise;Therapeutic activities;Cognitive remediation/compensation;Balance training;Patient/family education    OT Goals(Current goals can be found in the care plan section) Acute Rehab OT Goals Patient Stated Goal: to get better OT Goal Formulation: With patient Time For Goal Achievement: 09/04/19 Potential to Achieve Goals: Good  OT Frequency: Min 2X/week   Barriers to D/C: Decreased caregiver support          Co-evaluation              AM-PAC OT "6 Clicks" Daily Activity     Outcome Measure Help from another person eating meals?: A Little Help from another person taking care of personal grooming?: A Little Help from another person toileting, which includes using toliet, bedpan, or urinal?: Total Help from another person bathing (including washing, rinsing, drying)?: A Lot Help from another person to put on and taking off regular upper body clothing?: A Little Help from another person to put on and taking off regular lower body clothing?: Total 6 Click Score: 13   End of Session Nurse Communication: Mobility status  Activity Tolerance: Patient tolerated treatment well Patient left: in bed;with call bell/phone within reach;with bed alarm set  OT Visit  Diagnosis: Other abnormalities of gait and mobility (R26.89);Muscle weakness (generalized) (M62.81);Other symptoms and signs involving cognitive  function;Pain Pain - Right/Left: Left Pain - part of body: Leg                Time: 1202-1234 OT Time Calculation (min): 32 min Charges:  OT General Charges $OT Visit: 1 Visit OT Evaluation $OT Eval Moderate Complexity: 1 Mod OT Treatments $Self Care/Home Management : 8-22 mins  Jolaine Artist, OT Acute Rehabilitation Services Pager [phone] Office [place] 08/21/2019, 2:52 PM

## 2019-08-21 NOTE — Progress Notes (Signed)
NAME:  [name], MRN:  [id number], DOB:  06-21-1966, LOS: 9 ADMISSION DATE:  08/07/2019, CONSULTATION DATE: 07/28/2019 REFERRING MD: Dr. Roxanne Mins, CHIEF COMPLAINT: Cardiac arrest  Brief History   54 year old male who is status post cardiac arrest and septic shock secondary to MRSA pneumonia.  History of present illness   Patient was found unresponsive and had witnessed cardiac arrest, obtained ROSC after 3 minutes.  Patient required intubation for ongoing airway protection and unresponsiveness.  Required vasopressor support for hypotension.  Additionally found to have MRSA pneumonia, currently being treated with vancomycin.  Patient is now extubated since 08/18/2019.  Past Medical History   Past Medical History:  Diagnosis Date  . Anemia due to chronic kidney disease   . Cardiac arrest ([place]) 07/24/2019  . End-stage renal disease on hemodialysis ([place])   . Personal history of MRSA (methicillin resistant Staphylococcus aureus) 07/2020   pcr positive    Significant Hospital Events   1/28 Admitted. On levophed and added vasopressin  1/29: O/N Hyperkalemic, CRRT for correction also GOC discussion with family, made DNR. Still on levophed and multiple analgesics/sedatives. 2/03: Extubated, off pressors 2/04: Attempted bedside central line placement, IR consulted and placed L External Jugular line.  Consults:  Nephrology  Procedures:  2/4 - central line placement by IR .   Interim history/subjective:  Patient remains somnolent, required minimal pressors for BP, otherwise vitals stable and patient slowly improving.   Objective   Blood pressure 119/65, pulse 87, temperature 98.6 F (37 C), temperature source Oral, resp. rate 19, height 5\' 9"  (1.753 m), weight 91.4 kg, SpO2 100 %.        Intake/Output Summary (Last 24 hours) at 08/21/2019 1441 Last data filed at 08/21/2019 1200 Gross per 24 hour  Intake 1392.98 ml  Output 10 ml  Net 1382.98 ml   Filed Weights   08/20/19 0500  08/20/19 0730 08/21/19 0446  Weight: 91.6 kg 91.7 kg 91.4 kg    Examination: General: Awake, alert, sleepy.  More awake than yesterday HENT: Normocephalic, atraumatic, EOMI, PERRLA Lungs: Bilateral lower lobe crackles appreciated, normal work of breathing, satting 92% on 2L [place] Cardiovascular: RRR, clear S1-S2 present, no murmurs or gallops appreciated Abdomen: Soft, nondistended, normal bowel sounds appreciated, nontender to palpation Extremities: Left BKA, calciphylaxis appreciated to anterior lateral left thigh, wound dressing in place, no purulent drainage or bleeding appreciated; right lower extremity with 1+ pitting edema Neuro: Somnolent, no focal deficits, cranial nerves II-XII grossly intact  Resolved Hospital Problem list   Cardiac arrest Prolonged QTC Acute hypoxic respiratory failure  Assessment & Plan:   Multifactorial Shock, likely cardiogenic with superimposed septic shock.  Clinically improved but persistent vasopressor requirement.  Difficult to obtain accurate blood pressure.  Patient appears clinically the same independent of blood pressure -Continue to wean norepinephrine as tolerated -Cortrosyn stimulation test tomorrow to rule out adrenal insufficiency  MRSA pneumonia -Complete 7-day course of antibiotics.  Acute Encephalopathy, improving. -She is to have good pain control.  Have been able to wean narcotics  Calciphylaxis, chronic, stable:  Patient denies significant pain from his calciphylaxis this morning and 2/4.  Adequate pain control with Dilaudid, gabapentin. -Continue gabapentin200mg  BID - Added Cymbalta 20 mg daily 2/3 - Discontinue clonazepam 1mg  due to sleepiness - Dilaudid 4mg  q6 made as needed - Continue Sodium Thiosulfate 25mg  IV qMWF  ESRDon HD MWF: HD 2/5 - Nephrology consulted - appreciate recommendations  Anemia of Chronic Disease, chronic, stable:Hgb 8.6> 9.3. Transfused 2 units RBC 01/28-01/29 and 2  more on 2/2.. - f/u AM CBC  2/5 -Transfuse Hgb <7 with RBC. - Aranesp IV 184mcg every Saturday  Type 2Diabetes Mellitus:Blood sugar readings more stable 113-168 over the last 24 hours.  Patient asymptomatic. - mSSI  - Levemir 8 units BID - CBGs q4  Best practice:  Diet: Renal/carb modified with fluid restriction Pain/Anxiety/Delirium protocol (if indicated): Dilaudid 4 mg every 6 as needed, Cymbalta 20 mg, gabapentin 200 mg nightly DVT prophylaxis: Heparin GI prophylaxis: None at this time Glucose control: Levemir 8 units twice daily, moderate sliding scale insulin Mobility: Limited secondary to pain, left BKA Code Status: DNR Family Communication: None Disposition: Transfer to floor once stable  Labs   CBC: Recent Labs  Lab 08/18/19 0341 08/18/19 0615 08/19/19 1805 08/20/19 0345 08/21/19 0443  WBC 17.2* 14.4* 13.6* 19.7* 13.7*  HGB 10.7* 10.5* 8.6* 9.3* 8.0*  HCT 35.8* 34.7* 28.6* 31.6* 26.2*  MCV 89.9 90.6 92.6 93.5 92.3  PLT 204 173 161 205 [id number]    Basic Metabolic Panel: Recent Labs  Lab 08/18/19 0615 08/18/19 0615 08/18/19 1817 08/18/19 1817 08/19/19 0500 08/19/19 0500 08/19/19 1806 08/19/19 1806 08/19/19 2228 08/20/19 0345 08/20/19 0730 08/20/19 0911 08/21/19 0443 08/21/19 1125  NA 139   < > 142   < > 144  --  137  --  139  --  139  --  140  --   K 3.3*   < > 3.5   < > 3.9  --  4.3  --  4.0  --  4.2  --  4.2  --   CL 101   < > 108   < > 105  --  100  --  102  --  100  --  102  --   CO2 23   < > 18*   < > 20*  --  18*  --  17*  --  17*  --  19*  --   GLUCOSE 187*   < > 386*   < > 56*   < > 168*   < > 160*  --  133* 113* 123* 135*  BUN 38*   < > 28*   < > 40*  --  50*  --  50*  --  57*  --  42*  --   CREATININE 2.51*   < > 1.84*   < > 2.92*  --  3.66*  --  3.77*  --  3.87*  --  3.62*  --   CALCIUM 8.0*   < > 6.9*   < > 8.0*  7.4*  --  7.4*  --  7.5*  --  8.0*  --  7.4*  --   MG 2.6*  --   --   --  2.6*  --   --   --  2.5* 2.4  --   --  2.2  --   PHOS 2.8  --  2.1*  --  3.5   --  3.9  --   --   --  4.7*  --   --   --    < > = values in this interval not displayed.   GFR: Estimated Creatinine Clearance: 25.8 mL/min (A) (by C-G formula based on SCr of 3.62 mg/dL (H)). Recent Labs  Lab 08/18/19 0615 08/19/19 1805 08/20/19 0345 08/21/19 0443  WBC 14.4* 13.6* 19.7* 13.7*    Liver Function Tests: Recent Labs  Lab 08/18/19 0615 08/18/19 1817 08/19/19 0500 08/19/19 1806 08/20/19 0730  ALBUMIN 1.9* 1.7* 1.8* 1.7* 2.0*   No results for input(s): LIPASE, AMYLASE in the last 168 hours. No results for input(s): AMMONIA in the last 168 hours.  ABG    Component Value Date/Time   PHART 7.422 08/16/2019 1520   PCO2ART 33.7 08/16/2019 1520   PO2ART 177.0 (H) 08/16/2019 1520   HCO3 21.9 08/16/2019 1520   TCO2 23 08/16/2019 1520   ACIDBASEDEF 2.0 08/16/2019 1520   O2SAT 100.0 08/16/2019 1520     Coagulation Profile: No results for input(s): INR, PROTIME in the last 168 hours.  Cardiac Enzymes: No results for input(s): CKTOTAL, CKMB, CKMBINDEX, TROPONINI in the last 168 hours.  HbA1C: Hgb A1c MFr Bld  Date/Time Value Ref Range Status  07/26/2019 07:30 PM 5.6 4.8 - 5.6 % Final    Comment:    (NOTE) Pre diabetes:          5.7%-6.4% Diabetes:              >6.4% Glycemic control for   <7.0% adults with diabetes     CBG: Recent Labs  Lab 08/20/19 1537 08/20/19 1957 08/21/19 0059 08/21/19 0731 08/21/19 [place], [place] ICU Physician [place]  Pager: [phone] Mobile: [phone] After hours: [phone].  08/21/2019, 2:48 PM       08/21/2019 2:41 PM

## 2019-08-21 NOTE — Plan of Care (Signed)
  Problem: Education: Goal: Knowledge of General Education information will improve Description: Including pain rating scale, medication(s)/side effects and non-pharmacologic comfort measures Outcome: Progressing   Problem: Health Behavior/Discharge Planning: Goal: Ability to manage health-related needs will improve Outcome: Progressing   Problem: Clinical Measurements: Goal: Ability to maintain clinical measurements within normal limits will improve Outcome: Progressing Goal: Will remain free from infection Outcome: Progressing Goal: Respiratory complications will improve Outcome: Progressing Goal: Cardiovascular complication will be avoided Outcome: Progressing   Problem: Coping: Goal: Level of anxiety will decrease Outcome: Progressing   Problem: Pain Managment: Goal: General experience of comfort will improve Outcome: Progressing

## 2019-08-22 ENCOUNTER — Inpatient Hospital Stay (HOSPITAL_COMMUNITY): Payer: Medicare Other

## 2019-08-22 LAB — RENAL FUNCTION PANEL
Albumin: 2.1 g/dL — ABNORMAL LOW (ref 3.5–5.0)
Anion gap: 23 — ABNORMAL HIGH (ref 5–15)
BUN: 64 mg/dL — ABNORMAL HIGH (ref 6–20)
CO2: 18 mmol/L — ABNORMAL LOW (ref 22–32)
Calcium: 7.7 mg/dL — ABNORMAL LOW (ref 8.9–10.3)
Chloride: 99 mmol/L (ref 98–111)
Creatinine, Ser: 5.19 mg/dL — ABNORMAL HIGH (ref 0.61–1.24)
GFR calc Af Amer: 13 mL/min — ABNORMAL LOW (ref >60)
GFR calc non Af Amer: 12 mL/min — ABNORMAL LOW (ref >60)
Glucose, Bld: 153 mg/dL — ABNORMAL HIGH (ref 70–99)
Phosphorus: 4.7 mg/dL — ABNORMAL HIGH (ref 2.5–4.6)
Potassium: 4.6 mmol/L (ref 3.5–5.1)
Sodium: 140 mmol/L (ref 135–145)

## 2019-08-22 LAB — GLUCOSE, RANDOM: Glucose, Bld: 150 mg/dL — ABNORMAL HIGH (ref 70–99)

## 2019-08-22 LAB — CBC
HCT: 25.9 % — ABNORMAL LOW (ref 39.0–52.0)
HCT: 26.1 % — ABNORMAL LOW (ref 39.0–52.0)
Hemoglobin: 7.6 g/dL — ABNORMAL LOW (ref 13.0–17.0)
Hemoglobin: 7.8 g/dL — ABNORMAL LOW (ref 13.0–17.0)
MCH: 27 pg (ref 26.0–34.0)
MCH: 27.4 pg (ref 26.0–34.0)
MCHC: 29.3 g/dL — ABNORMAL LOW (ref 30.0–36.0)
MCHC: 29.9 g/dL — ABNORMAL LOW (ref 30.0–36.0)
MCV: 92.2 fL (ref 80.0–100.0)
MCV: 91.6 fL (ref 80.0–100.0)
Platelets: 233 10*3/uL (ref 150–400)
Platelets: 260 10*3/uL (ref 150–400)
RBC: 2.81 MIL/uL — ABNORMAL LOW (ref 4.22–5.81)
RBC: 2.85 MIL/uL — ABNORMAL LOW (ref 4.22–5.81)
RDW: 20.8 % — ABNORMAL HIGH (ref 11.5–15.5)
RDW: 20.6 % — ABNORMAL HIGH (ref 11.5–15.5)
WBC: 13.1 10*3/uL — ABNORMAL HIGH (ref 4.0–10.5)
WBC: 16.6 10*3/uL — ABNORMAL HIGH (ref 4.0–10.5)
nRBC: 0.2 % (ref 0.0–0.2)
nRBC: 0.1 % (ref 0.0–0.2)

## 2019-08-22 LAB — GLUCOSE, CAPILLARY
Glucose-Capillary: 114 mg/dL — ABNORMAL HIGH (ref 70–99)
Glucose-Capillary: 124 mg/dL — ABNORMAL HIGH (ref 70–99)
Glucose-Capillary: 132 mg/dL — ABNORMAL HIGH (ref 70–99)
Glucose-Capillary: 149 mg/dL — ABNORMAL HIGH (ref 70–99)
Glucose-Capillary: 152 mg/dL — ABNORMAL HIGH (ref 70–99)
Glucose-Capillary: 164 mg/dL — ABNORMAL HIGH (ref 70–99)
Glucose-Capillary: 175 mg/dL — ABNORMAL HIGH (ref 70–99)

## 2019-08-22 LAB — MAGNESIUM: Magnesium: 2.4 mg/dL (ref 1.7–2.4)

## 2019-08-22 LAB — ACTH STIMULATION, 3 TIME POINTS
Cortisol, 30 Min: 26.6 ug/dL
Cortisol, 60 Min: 27.1 ug/dL
Cortisol, Base: 19.6 ug/dL

## 2019-08-22 MED ORDER — CHLORHEXIDINE GLUCONATE CLOTH 2 % EX PADS: 6.0000 | MEDICATED_PAD | Freq: Every day | CUTANEOUS | Status: DC

## 2019-08-22 MED ORDER — VANCOMYCIN HCL IN DEXTROSE 1-5 GM/200ML-% IV SOLN
1000.0000 mg | Freq: Once | INTRAVENOUS | Status: AC
  Administered 2019-08-22: 1000 mg via INTRAVENOUS
  Filled 2019-08-22: qty 200

## 2019-08-22 MED ORDER — IOHEXOL 300 MG/ML  SOLN
100.0000 mL | Freq: Once | INTRAMUSCULAR | Status: AC | PRN
  Administered 2019-08-22: 100 mL via INTRAVENOUS

## 2019-08-22 MED ORDER — SODIUM CHLORIDE 0.9 % IV SOLN
2.0000 g | INTRAVENOUS | Status: DC
  Administered 2019-08-22: 2 g via INTRAVENOUS
  Filled 2019-08-22: qty 2

## 2019-08-22 MED ORDER — SODIUM CHLORIDE 0.9 % IV SOLN
1.0000 g | INTRAVENOUS | Status: DC
  Administered 2019-08-23 – 2019-08-27 (×5): 1 g via INTRAVENOUS
  Filled 2019-08-22 (×6): qty 1

## 2019-08-22 MED ORDER — VANCOMYCIN HCL 750 MG/150ML IV SOLN
750.0000 mg | INTRAVENOUS | Status: DC
  Administered 2019-08-23: 750 mg via INTRAVENOUS
  Filled 2019-08-22 (×3): qty 150

## 2019-08-22 MED ORDER — VANCOMYCIN HCL 1500 MG/300ML IV SOLN
1500.0000 mg | Freq: Once | INTRAVENOUS | Status: DC
  Filled 2019-08-22: qty 300

## 2019-08-22 NOTE — Progress Notes (Signed)
NAME:  [name], MRN:  [id number], DOB:  1965/12/13, LOS: 23 ADMISSION DATE:  07/30/2019, CONSULTATION DATE: 07/23/2019 REFERRING MD: Dr. Roxanne Mins, CHIEF COMPLAINT: Cardiac arrest  Brief History   54 year old male who is status post cardiac arrest and septic shock secondary to MRSA pneumonia.  History of present illness   Patient was found unresponsive and had witnessed cardiac arrest, obtained ROSC after 3 minutes.  Patient required intubation for ongoing airway protection and unresponsiveness.  Required vasopressor support for hypotension.  Additionally found to have MRSA pneumonia, currently being treated with vancomycin.  Patient is now extubated since 08/18/2019.  Past Medical History   Past Medical History:  Diagnosis Date  . Anemia due to chronic kidney disease   . Cardiac arrest ([place]) 07/28/2019  . End-stage renal disease on hemodialysis ([place])   . Personal history of MRSA (methicillin resistant Staphylococcus aureus) 07/2020   pcr positive    Significant Hospital Events   1/28 Admitted. On levophed and added vasopressin  1/29: O/N Hyperkalemic, CRRT for correction also GOC discussion with family, made DNR. Still on levophed and multiple analgesics/sedatives. 2/03: Extubated, off pressors 2/04: Attempted bedside central line placement, IR consulted and placed L External Jugular line.  Consults:  Nephrology  Procedures:  2/4 - central line placement by IR .   Interim history/subjective:  More awake.  Denies pain.  Continues to require low-dose vasopressors.  Objective   Blood pressure (!) 97/55, pulse 87, temperature 98.5 F (36.9 C), temperature source Oral, resp. rate 20, height 5\' 9"  (1.753 m), weight 92.5 kg, SpO2 96 %.        Intake/Output Summary (Last 24 hours) at 08/22/2019 1355 Last data filed at 08/22/2019 1300 Gross per 24 hour  Intake 1334.06 ml  Output 800 ml  Net 534.06 ml   Filed Weights   08/20/19 0730 08/21/19 0446 08/22/19 0426  Weight:  91.7 kg 91.4 kg 92.5 kg    Examination: General: Awake, alert, sleepy.  More awake than yesterday HENT: Normocephalic, atraumatic, EOMI, PERRLA Lungs: Bilateral lower lobe crackles appreciated, normal work of breathing, satting 92% on 2L [place] Cardiovascular: RRR, clear S1-S2 present, no murmurs or gallops appreciated Abdomen: Soft, nondistended, normal bowel sounds appreciated, nontender to palpation Extremities: Left BKA, calciphylaxis appreciated to anterior lateral left thigh, wound dressing in place, foul smell noted.  Right lower extremity with 1+ pitting edema Neuro: Somnolent, no focal deficits, cranial nerves II-XII grossly intact  Resolved Hospital Problem list   Cardiac arrest Prolonged QTC Acute hypoxic respiratory failure MRSA pneumonia Acute Encephalopathy Assessment & Plan:   Multifactorial Shock, likely cardiogenic with superimposed septic shock.  Clinically improved but persistent vasopressor requirement.  Difficult to obtain accurate blood pressure.  Patient continues to improve despite this. Given foul smell coming from leg suspected deeper soft tissue infection or gangrene driving vasopressor requirements.  Negative Cortrosyn stimulation test -Continue to wean norepinephrine as tolerated -CT of leg to assess extent of deep tissue necrosis or infection -May require surgical debridement.  Calciphylaxis, chronic, stable:  Patient denies significant pain from his calciphylaxis this morning and 2/4.  Adequate pain control with Dilaudid, gabapentin. -Continue gabapentin200mg  BID - Added Cymbalta 20 mg daily 2/3 - Discontinue clonazepam 1mg  due to sleepiness - Dilaudid 4mg  q6 made as needed - Continue Sodium Thiosulfate 25mg  IV qMWF  ESRDon HD MWF: HD 2/5 - Nephrology consulted - appreciate recommendations  Anemia of Chronic Disease, chronic, stable:Hgb 8.6> 9.3. Transfused 2 units RBC 01/28-01/29 and 2 more on 2/2.[place] -  f/u AM CBC 2/5 -Transfuse Hgb <7 with  RBC. - Aranesp IV 120mcg every Saturday  Type 2Diabetes Mellitus:Blood sugar readings more stable 113-168 over the last 24 hours.  Patient asymptomatic. - mSSI  - Levemir 8 units BID - CBGs q4  Best practice:  Diet: Renal/carb modified with fluid restriction Pain/Anxiety/Delirium protocol (if indicated): Dilaudid 4 mg every 6 as needed, Cymbalta 20 mg, gabapentin 200 mg nightly DVT prophylaxis: Heparin GI prophylaxis: None at this time Glucose control: Levemir 8 units twice daily, moderate sliding scale insulin Mobility: Limited secondary to pain, left BKA Code Status: DNR Family Communication: None Disposition: Transfer to floor once stable  Labs   CBC: Recent Labs  Lab 08/19/19 1805 08/20/19 0345 08/21/19 0443 08/22/19 0427 08/22/19 0946  WBC 13.6* 19.7* 13.7* 13.1* 16.6*  HGB 8.6* 9.3* 8.0* 7.6* 7.8*  HCT 28.6* 31.6* 26.2* 25.9* 26.1*  MCV 92.6 93.5 92.3 92.2 91.6  PLT 161 205 200 233 [id number]    Basic Metabolic Panel: Recent Labs  Lab 08/18/19 1817 08/18/19 1817 08/19/19 0500 08/19/19 0500 08/19/19 1806 08/19/19 1806 08/19/19 2228 08/19/19 2228 08/20/19 0345 08/20/19 0730 08/20/19 0730 08/20/19 0911 08/21/19 0443 08/21/19 1125 08/21/19 2230 08/22/19 0427 08/22/19 0946  NA 142   < > 144   < > 137  --  139  --   --  139  --   --  140  --   --   --  140  K 3.5   < > 3.9   < > 4.3  --  4.0  --   --  4.2  --   --  4.2  --   --   --  4.6  CL 108   < > 105   < > 100  --  102  --   --  100  --   --  102  --   --   --  99  CO2 18*   < > 20*   < > 18*  --  17*  --   --  17*  --   --  19*  --   --   --  18*  GLUCOSE 386*   < > 56*   < > 168*   < > 160*   < >  --  133*   < > 113* 123* 135* 105*  --  153*  BUN 28*   < > 40*   < > 50*  --  50*  --   --  57*  --   --  42*  --   --   --  64*  CREATININE 1.84*   < > 2.92*   < > 3.66*  --  3.77*  --   --  3.87*  --   --  3.62*  --   --   --  5.19*  CALCIUM 6.9*   < > 8.0*  7.4*   < > 7.4*  --  7.5*  --   --  8.0*  --    --  7.4*  --   --   --  7.7*  MG  --   --  2.6*  --   --   --  2.5*  --  2.4  --   --   --  2.2  --   --  2.4  --   PHOS 2.1*  --  3.5  --  3.9  --   --   --   --  4.7*  --   --   --   --   --   --  4.7*   < > = values in this interval not displayed.   GFR: Estimated Creatinine Clearance: 18.1 mL/min (A) (by C-G formula based on SCr of 5.19 mg/dL (H)). Recent Labs  Lab 08/20/19 0345 08/21/19 0443 08/22/19 0427 08/22/19 0946  WBC 19.7* 13.7* 13.1* 16.6*    Liver Function Tests: Recent Labs  Lab 08/18/19 1817 08/19/19 0500 08/19/19 1806 08/20/19 0730 08/22/19 0946  ALBUMIN 1.7* 1.8* 1.7* 2.0* 2.1*   No results for input(s): LIPASE, AMYLASE in the last 168 hours. No results for input(s): AMMONIA in the last 168 hours.  ABG    Component Value Date/Time   PHART 7.422 08/16/2019 1520   PCO2ART 33.7 08/16/2019 1520   PO2ART 177.0 (H) 08/16/2019 1520   HCO3 21.9 08/16/2019 1520   TCO2 23 08/16/2019 1520   ACIDBASEDEF 2.0 08/16/2019 1520   O2SAT 100.0 08/16/2019 1520     Coagulation Profile: No results for input(s): INR, PROTIME in the last 168 hours.  Cardiac Enzymes: No results for input(s): CKTOTAL, CKMB, CKMBINDEX, TROPONINI in the last 168 hours.  HbA1C: Hgb A1c MFr Bld  Date/Time Value Ref Range Status  08/04/2019 07:30 PM 5.6 4.8 - 5.6 % Final    Comment:    (NOTE) Pre diabetes:          5.7%-6.4% Diabetes:              >6.4% Glycemic control for   <7.0% adults with diabetes     CBG: Recent Labs  Lab 08/21/19 2040 08/21/19 2315 08/22/19 0539 08/22/19 0855 08/22/19 1204  GLUCAP 95 138* 124* 132* [place], [place] ICU Physician [place]  Pager: [phone] Mobile: [phone] After hours: [phone].  08/22/2019, 1:55 PM       08/22/2019 1:55 PM

## 2019-08-22 NOTE — Progress Notes (Signed)
Pharmacy Antibiotic Note  [name] is a 54 y.o. male admitted on 08/13/2019 with MRSA PNA s/p treatment now with suspected deep soft tissue infection or gangrene.  Pharmacy has been consulted for vancomycin and cefepime dosing.  Patient is on dialysis, switched to CRRT 1/28 to 2/3, now back on HD MWF with low pressures requiring norepinephrine. Per MD, with foul smell from leg. Persistent leukocytosis, afebrile. Last dose of vancomycin given 2/4, dialyzed 2/5 for 4 hours. Had vancomycin trough of 17 on 750mg  Q24hr on CRRT. Will give small 1g dose to "re-load" then dose 750mg  Post HD given that CRRT can remove more vancomycin than conventional iHD. Will check preHD level.   Plan: Start cefepime 1g Q24 hr Give vancomycin 1g once then 750mg  post HD  F/u HD schedule and dose vancomycin accordingly Order pre-HD level after 2-3 doses  Monitor cultures, clinical status, renal fx Narrow abx as able and f/u duration    Height: 5\' 9"  (175.3 cm) Weight: 203 lb 14.8 oz (92.5 kg) IBW/kg (Calculated) : 70.7  Temp (24hrs), Avg:98.6 F (37 C), Min:98.1 F (36.7 C), Max:99 F (37.2 C)  Recent Labs  Lab 08/19/19 0500 08/19/19 1805 08/19/19 1806 08/19/19 2228 08/20/19 0345 08/20/19 0730 08/21/19 0443 08/22/19 0427 08/22/19 0946  WBC  --  13.6*  --   --  19.7*  --  13.7* 13.1* 16.6*  CREATININE   < >  --  3.66* 3.77*  --  3.87* 3.62*  --  5.19*   < > = values in this interval not displayed.    Estimated Creatinine Clearance: 18.1 mL/min (A) (by C-G formula based on SCr of 5.19 mg/dL (H)).     Antimicrobials this admission: Cefepime 2/7>>  Vancomycin 1/28 > 2/4; 2/7>>  Zosyn 1/28 > 1/31  Dose adjustments this admission:  1/31 VT (on CRRT) = 17- cont 750 q 24  Microbiology results:  1/28 MRSA + 1/28 BCx x 2: NGTD 1/28 Aspirate cx: moderate MRSA  1/28 covid/flu neg   Benetta Spar, PharmD, BCPS, BCCP Clinical Pharmacist  Please check AMION for all [place] phone  numbers After 10:00 PM, call [place] [phone]

## 2019-08-22 NOTE — Plan of Care (Signed)
Pt is alert  resting on room air, breathing is even and unlabored. Oxygen probe has been changed a few times due to inaccurate reading. Pt has a flat affect, is calm, and cooperative. Pt remains alert to voice, sometimes touch, and drowsy. Pt answers all questions appropriately and is sometimes disoriented to time. Pt is able to move all extremities but has generalized weakness and groans when turned due to wounds. Pt was bathed and all wound dressings were changed, some serosanguinous drainage noted on dressings, wounds are malodorous. Pt drank one full nutritional drink last night and few bites of pudding; pt had a copious amount of sudden vomiting. Pt denies nausea at this time. Pt voices no complaints or concerns at this time. Call bell is within reach and bed is in lowest position.   Problem: Clinical Measurements: Goal: Respiratory complications will improve Outcome: Progressing Goal: Cardiovascular complication will be avoided Outcome: Progressing   Problem: Pain Managment: Goal: General experience of comfort will improve Outcome: Progressing   Problem: Safety: Goal: Ability to remain free from injury will improve Outcome: Progressing

## 2019-08-22 NOTE — Progress Notes (Signed)
[facility] Progress Note  Subjective: He has no complaints this am PCCM attempting to wean pressors. AM cortisol ok.   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  Access right femoral TDC in place with dressing c/d/i  [place] MWF   R fem TDC   450/800  2/2 bath  86kg   -mircera 200 mcg every 2 weeks last on 1/16 -Sodium thiosulfate 25 gram every treatment  -Weekly venofer -parsabiv 2.5 mg three times a week    CXR 1/28 - no active disease  Assessment/Plan:  # Cardiac arrest/ found down - Supportive care per pulm critical care--> improved  # Acute hypoxic respiratory failure - extubated to [place] now  # End-stage renal disease - Normally reported as MWF schedule  - CRRT started 1/28 and off 2/3 PM - iHD 2/5 - tolerated OK despite quite low BP on cuff.  Despite midodrine 10 TID he is still requiring NE gtt at low dose.  I'm concerned that this will be an ongoing issue and may limit his ability to receive ongoing dialytic therapies outpt; discussed with him today. He's clearly more edematous today and needs UF with HD tomorrow so we may be stuck using a low dose pressor to facilitate.  Cortisol ok this AM.    # Shock - likely septic, MRSA growing resp cx, bcx's negative on IV abx - Improved but still on low dose pressor support + midodrine 10 TID; per above. -Completed course of vanc 2/5  # Anemia acute on chronic -  Status post PRBCs - last mircera was on 1/30 at 150 mcg -Hb 7-8s now and intermittent transfusions have been required..   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  [name] [facility] Pager [phone]    Inpatient medications: . Chlorhexidine Gluconate Cloth  6 each Topical Daily  . Chlorhexidine Gluconate Cloth  6 each Topical Q0600  . cinacalcet  30 mg Oral Q breakfast  . darbepoetin (ARANESP) injection - DIALYSIS  150 mcg Intravenous Q Sat-HD  . DULoxetine  20 mg Oral Daily  . feeding supplement (NEPRO CARB STEADY)  237 mL Oral TID BM  . feeding supplement (PRO-STAT SUGAR FREE 64)  30 mL Oral BID  . gabapentin  200 mg Oral QHS  . insulin aspart  0-15 Units Subcutaneous Q6H  . insulin detemir  8 Units Subcutaneous BID  . midodrine  10 mg Oral TID WC  . multivitamin  1 tablet Oral QHS  . sodium chloride flush  10-40 mL Intracatheter Q12H   . sodium chloride    . sodium chloride    . norepinephrine (LEVOPHED) Adult infusion 8 mcg/min (08/22/19 0700)  . sodium thiosulfate infusion for calciphylaxis 25 g (08/20/19 1106)   sodium chloride, sodium chloride, albuterol, alteplase, bisacodyl, heparin, HYDROmorphone, hydrOXYzine, influenza vac split quadrivalent PF, lidocaine (PF), lidocaine (PF), lidocaine-prilocaine, pentafluoroprop-tetrafluoroeth, pneumococcal 23 valent vaccine, senna-docusate, sodium chloride flush Recent Labs  Lab 08/19/19 1806 08/19/19 2228 08/20/19 0730 08/20/19 0911 08/21/19 0443 08/21/19 0443 08/21/19 1125 08/21/19 2230  NA 137   < > 139  --  140  --   --   --   K 4.3   < > 4.2  --  4.2  --    --   --   CL 100   < > 100  --  102  --   --   --   CO2 18*   < > 17*  --  19*  --   --   --   GLUCOSE 168*   < > 133*   < > 123*   < > 135* 105*  BUN 50*   < > 57*  --  42*  --   --   --   CREATININE 3.66*   < > 3.87*  --  3.62*  --   --   --   CALCIUM 7.4*   < > 8.0*  --  7.4*  --   --   --   PHOS 3.9  --  4.7*  --   --   --   --   --    < > = values in this interval not displayed.

## 2019-08-23 LAB — RENAL FUNCTION PANEL
Albumin: 2 g/dL — ABNORMAL LOW (ref 3.5–5.0)
Anion gap: 23 — ABNORMAL HIGH (ref 5–15)
BUN: 75 mg/dL — ABNORMAL HIGH (ref 6–20)
CO2: 20 mmol/L — ABNORMAL LOW (ref 22–32)
Calcium: 7.6 mg/dL — ABNORMAL LOW (ref 8.9–10.3)
Chloride: 93 mmol/L — ABNORMAL LOW (ref 98–111)
Creatinine, Ser: 5.92 mg/dL — ABNORMAL HIGH (ref 0.61–1.24)
GFR calc Af Amer: 11 mL/min — ABNORMAL LOW (ref >60)
GFR calc non Af Amer: 10 mL/min — ABNORMAL LOW (ref >60)
Glucose, Bld: 139 mg/dL — ABNORMAL HIGH (ref 70–99)
Phosphorus: 4.8 mg/dL — ABNORMAL HIGH (ref 2.5–4.6)
Potassium: 4.5 mmol/L (ref 3.5–5.1)
Sodium: 136 mmol/L (ref 135–145)

## 2019-08-23 LAB — GLUCOSE, RANDOM
Glucose, Bld: 174 mg/dL — ABNORMAL HIGH (ref 70–99)
Glucose, Bld: 125 mg/dL — ABNORMAL HIGH (ref 70–99)

## 2019-08-23 LAB — CBC
HCT: 25.6 % — ABNORMAL LOW (ref 39.0–52.0)
Hemoglobin: 7.5 g/dL — ABNORMAL LOW (ref 13.0–17.0)
MCH: 27.1 pg (ref 26.0–34.0)
MCHC: 29.3 g/dL — ABNORMAL LOW (ref 30.0–36.0)
MCV: 92.4 fL (ref 80.0–100.0)
Platelets: 261 10*3/uL (ref 150–400)
RBC: 2.77 MIL/uL — ABNORMAL LOW (ref 4.22–5.81)
RDW: 20.2 % — ABNORMAL HIGH (ref 11.5–15.5)
WBC: 14.8 10*3/uL — ABNORMAL HIGH (ref 4.0–10.5)
nRBC: 0 % (ref 0.0–0.2)

## 2019-08-23 LAB — GLUCOSE, CAPILLARY
Glucose-Capillary: 140 mg/dL — ABNORMAL HIGH (ref 70–99)
Glucose-Capillary: 110 mg/dL — ABNORMAL HIGH (ref 70–99)
Glucose-Capillary: 71 mg/dL (ref 70–99)
Glucose-Capillary: 39 mg/dL — CL (ref 70–99)
Glucose-Capillary: 70 mg/dL (ref 70–99)
Glucose-Capillary: 142 mg/dL — ABNORMAL HIGH (ref 70–99)

## 2019-08-23 MED ORDER — ALBUMIN HUMAN 25 % IV SOLN
25.0000 g | Freq: Once | INTRAVENOUS | Status: AC
  Administered 2019-08-23: 25 g via INTRAVENOUS

## 2019-08-23 MED ORDER — HEPARIN SODIUM (PORCINE) 1000 UNIT/ML IJ SOLN
INTRAMUSCULAR | Status: AC
  Filled 2019-08-23: qty 3

## 2019-08-23 MED ORDER — PENTAFLUOROPROP-TETRAFLUOROETH EX AERO: 1.0000 "application " | INHALATION_SPRAY | CUTANEOUS | Status: DC | PRN

## 2019-08-23 MED ORDER — DEXTROSE 50 % IV SOLN
INTRAVENOUS | Status: AC
  Administered 2019-08-23: 25 mL via INTRAVENOUS
  Filled 2019-08-23: qty 50

## 2019-08-23 MED ORDER — SODIUM CHLORIDE 0.9 % IV SOLN: 100.0000 mL | INTRAVENOUS | Status: DC | PRN

## 2019-08-23 MED ORDER — HEPARIN SODIUM (PORCINE) 1000 UNIT/ML DIALYSIS: 1000.0000 [IU] | INTRAMUSCULAR | Status: DC | PRN

## 2019-08-23 MED ORDER — LIDOCAINE HCL (PF) 1 % IJ SOLN: 5.0000 mL | INTRAMUSCULAR | Status: DC | PRN

## 2019-08-23 MED ORDER — ALTEPLASE 2 MG IJ SOLR: 2.0000 mg | Freq: Once | INTRAMUSCULAR | Status: DC | PRN

## 2019-08-23 MED ORDER — DEXTROSE 50 % IV SOLN: 25.0000 mL | Freq: Once | INTRAVENOUS | Status: AC

## 2019-08-23 MED ORDER — ALBUMIN HUMAN 5 % IV SOLN
INTRAVENOUS | Status: AC
  Filled 2019-08-23: qty 500

## 2019-08-23 MED ORDER — LIDOCAINE-PRILOCAINE 2.5-2.5 % EX CREA: 1.0000 "application " | TOPICAL_CREAM | CUTANEOUS | Status: DC | PRN

## 2019-08-23 NOTE — Procedures (Signed)
   I was present at this dialysis session, have reviewed the session itself and made  appropriate changes Kelly Splinter MD [place] pager [phone]   08/23/2019, 11:02 AM

## 2019-08-23 NOTE — Progress Notes (Signed)
NAME:  [name], MRN:  [id number], DOB:  08/23/1965, LOS: 43 ADMISSION DATE:  07/20/2019, CONSULTATION DATE: 08/11/2019 REFERRING MD: Dr. Roxanne Mins, CHIEF COMPLAINT: Cardiac arrest  Brief History   54 year old male who is status post cardiac arrest and septic shock secondary to MRSA pneumonia.  History of present illness   Patient was found unresponsive and had witnessed cardiac arrest, obtained ROSC after 3 minutes.  Patient required intubation for ongoing airway protection and unresponsiveness. Required vasopressor support for hypotension.  Additionally found to have MRSA pneumonia, currently being treated with vancomycin.  Patient extubated on 08/18/2019.  Patient continues to require pressors for BP and HR despite completed treatment of MRSA Pneumonia. Concern is growing for new source of infection or inflammation, including left thigh calciphylaxis, penile gangrene, or other pathology of right lower leg.    Past Medical History   Past Medical History:  Diagnosis Date  . Anemia due to chronic kidney disease   . Cardiac arrest ([place]) 07/19/2019  . End-stage renal disease on hemodialysis ([place])   . Personal history of MRSA (methicillin resistant Staphylococcus aureus) 07/2020   pcr positive    Significant Hospital Events   1/28 Admitted. On levophed and added vasopressin  1/29: O/N Hyperkalemic, CRRT for correction also GOC discussion with family, made DNR. Still on levophed and multiple analgesics/sedatives. 2/03: Extubated, off pressors 2/04: Attempted bedside central line placement, IR consulted and placed L External Jugular line. 2/07: ACTH stimulation test performed, results normal  Consults:  Nephrology General surgery > deferred to Ortho > deferred to Wound Care  Procedures:  2/4 - central line placement by IR  Significant Diagnostic Tests:  DG Chest 1 View  Result Date: 08/19/2019 CLINICAL DATA:  Central line placement EXAM: CHEST  1 VIEW COMPARISON:  08/17/2019  FINDINGS: Frontal view of the chest demonstrates stable cardiac silhouette. Vascular stent right subclavian distribution unchanged. Central venous catheter from an inferior approach tip overlies the junction of the inferior vena cava and right atrium. I do not see any new central venous catheter is on this exam. Stable bibasilar scarring. No airspace disease, effusion, or pneumothorax. IMPRESSION: 1. Stable central venous catheter from an inferior approach. 2. No other central lines are identified on this exam. Electronically Signed   By: Randa Ngo M.D.   On: 08/19/2019 14:14   CT Head Wo Contrast  Result Date: 08/14/2019 CLINICAL DATA:  Cerebral hemorrhage suspected, unresponsive, found by friend post witnessed arrest EXAM: CT HEAD WITHOUT CONTRAST TECHNIQUE: Contiguous axial images were obtained from the base of the skull through the vertex without intravenous contrast. COMPARISON:  None. FINDINGS: Brain: No evidence of acute infarction, hemorrhage, hydrocephalus, extra-axial collection or mass lesion/mass effect. Symmetric prominence of the ventricles, cisterns and sulci compatible with parenchymal volume loss. Patchy areas of white matter hypoattenuation are most compatible with chronic microvascular angiopathy. Extensive dural calcifications are noted along the falx and tentorium. Vascular: Extensive calcification of the carotid siphons and vertebral arteries. No unexpected hyperdense vessel is evident. Skull: No calvarial fracture or suspicious osseous lesion. No scalp swelling or hematoma. Diffuse scalp edema is seen. Benign scattered scalp calcifications. Sinuses/Orbits: Patient is intubated at the time of exam with tract transesophageal and endotracheal tubes extending below the level of imaging. Secretions in the ethmoids and posterior nasopharynx are likely related to this intubation. Minimal mucosal thickening in the maxillary sinuses. Prior lens extractions are noted. Mildly proptotic  configuration of the globes which is symmetric bilaterally. No abnormal thickening of the extraocular musculature  or abnormality of the optic nerve should complexes or superior ophthalmic veins. Other: None IMPRESSION: 1. No CT evidence of acute intracranial process. 2. Chronic microvascular ischemic disease and parenchymal volume loss. 3. Diffuse soft tissue edema.  Correlate with hydration status. 4. Extensive intracranial atherosclerosis. 5. Secretions in the ethmoids and posterior nasopharynx are likely to intubation. 6. Mildly proptotic configuration of the globes which is symmetric bilaterally. Electronically Signed   By: Lovena Le M.D.   On: 08/09/2019 02:43   CT FEMUR LEFT W CONTRAST  Result Date: 08/22/2019 CLINICAL DATA:  Calciphylaxis and foul-smelling leg wound. EXAM: CT OF THE LOWER RIGHT EXTREMITY WITH CONTRAST TECHNIQUE: Multidetector CT imaging of the lower right extremity was performed according to the standard protocol following intravenous contrast administration. COMPARISON:  None. 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 No evidence of osteomyelitis. 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 Ligaments Suboptimally assessed by CT. Muscles and Tendons Broad skin defect along the anterolateral left thigh without undermining or collection. A knee joint effusion is present without any articular erosions or significant size to implicate septic effusion. Soft tissues Bilateral subcutaneous reticulation, likely from volume overload. There could be an element of cellulitis which would be better detected on exam. No evidence of fasciitis. No soft tissue emphysema. Heavily calcified vessels especially of the superficial femoral artery which is not visibly enhancing. IMPRESSION: 1. Negative for abscess or osteomyelitis. 2.  Renal osteodystrophy. Electronically Signed   By: Monte Fantasia M.D.   On: 08/22/2019 14:22   IR Fluoro Guide CV Line Left  Result Date: 08/19/2019 INDICATION: 54 year old with poor venous access, end-stage renal disease and hemodialysis. Recent cardiac arrest. EXAM: FLUOROSCOPIC AND ULTRASOUND GUIDED PLACEMENT OF A NON TUNNELED VENOUS CATHETER Physician: Stephan Minister. Henn, MD MEDICATIONS: None ANESTHESIA/SEDATION: None FLUOROSCOPY TIME:  Fluoroscopy Time: 1 minutes and 18 seconds COMPLICATIONS: None immediate. PROCEDURE: Informed consent was obtained for catheter placement. The patient was placed supine on the interventional table. Ultrasound confirmed a patent left external jugular vein. Ultrasound images were obtained for documentation. The left neck was prepped and draped in a sterile fashion. The left neck was anesthetized with 1% lidocaine. Maximal barrier sterile technique was utilized including caps, mask, sterile gowns, sterile gloves, sterile drape, hand hygiene and skin antiseptic. A 21 gauge needle directed into the left external jugular vein with ultrasound guidance. A micropuncture dilator set was placed. Central venogram was performed. Dual lumen Power PICC line was selected. Catheter was cut to 8 cm. A peel-away sheath was placed over a 0.018 wire. The Power PICC was advanced through the peel-away sheath and positioned in the left innominate vein. Both lumens aspirated and flushed well. Both lumens were flushed with saline, capped and clamped. Catheter was sutured to skin. Dressing was placed over the catheter. Fluoroscopic and ultrasound images were taken and saved for documentation. FINDINGS: Patent left external jugular vein. Central venogram demonstrates obstruction of the central left innominate vein with extensive collateral flow into the SVC. Tip of the venous catheter was placed in what appears to be the left innominate vein. IMPRESSION: Placement of a left external jugular venous catheter.  Catheter tip in the region of the left innominate vein. 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 Electronically Signed   By: Markus Daft M.D.   On: 08/19/2019 18:20   IR US Guide Vasc Access Left  Result Date: 08/19/2019  INDICATION: 54 year old with poor venous access, end-stage renal disease and hemodialysis. Recent cardiac arrest. EXAM: FLUOROSCOPIC AND ULTRASOUND GUIDED PLACEMENT OF A NON TUNNELED VENOUS CATHETER Physician: Stephan Minister. Henn, MD MEDICATIONS: None ANESTHESIA/SEDATION: None FLUOROSCOPY TIME:  Fluoroscopy Time: 1 minutes and 18 seconds COMPLICATIONS: None immediate. PROCEDURE: Informed consent was obtained for catheter placement. The patient was placed supine on the interventional table. Ultrasound confirmed a patent left external jugular vein. Ultrasound images were obtained for documentation. The left neck was prepped and draped in a sterile fashion. The left neck was anesthetized with 1% lidocaine. Maximal barrier sterile technique was utilized including caps, mask, sterile gowns, sterile gloves, sterile drape, hand hygiene and skin antiseptic. A 21 gauge needle directed into the left external jugular vein with ultrasound guidance. A micropuncture dilator set was placed. Central venogram was performed. Dual lumen Power PICC line was selected. Catheter was cut to 8 cm. A peel-away sheath was placed over a 0.018 wire. The Power PICC was advanced through the peel-away sheath and positioned in the left innominate vein. Both lumens aspirated and flushed well. Both lumens were flushed with saline, capped and clamped. Catheter was sutured to skin. Dressing was placed over the catheter. Fluoroscopic and ultrasound images were taken and saved for documentation. FINDINGS: Patent left external jugular vein. Central venogram demonstrates obstruction of the central left innominate vein with extensive collateral flow into the SVC. Tip of the venous catheter was placed in what appears to be the left  innominate vein. IMPRESSION: Placement of a left external jugular venous catheter. Catheter tip in the region of the left innominate vein. 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 Electronically Signed   By: Markus Daft M.D.   On: 08/19/2019 18:20   DG Chest Port 1 View  Result Date: 08/17/2019 CLINICAL DATA:  Recent cardiac arrest, check endotracheal tube placement EXAM: PORTABLE CHEST 1 VIEW COMPARISON:  08/15/2018 FINDINGS: Endotracheal tube and gastric catheter are noted in satisfactory position. Femoral dialysis catheter is noted in satisfactory position. Mild atelectatic changes are noted in the left base. No effusion is seen. The right subclavian venous stent and brachial venous stent are noted. IMPRESSION: Mild left basilar atelectasis. Tubes and lines as described Electronically Signed   By: Inez Catalina M.D.   On: 08/17/2019 02:21   DG Chest Port 1 View  Result Date: 08/16/2019 CLINICAL DATA:  Respiratory failure. EXAM: PORTABLE CHEST 1 VIEW COMPARISON:  02/20/2018 FINDINGS: The endotracheal tube is 5.5 cm above the carina. The NG tube is coursing down the esophagus and into the stomach. A central venous catheter is noted in the right atrium coming up from the IVC. Vascular stents are noted. Aortic calcifications are stable. The heart is normal in size. Streaky left basilar atelectasis but overall improved lung aeration since prior study. IMPRESSION: 1. Stable support apparatus. 2. Improved lung aeration with residual streaky left basilar atelectasis. Electronically Signed   By: Marijo Sanes M.D.   On: 08/16/2019 07:07   DG CHEST PORT 1 VIEW  Result Date: 07/17/2019 CLINICAL DATA:  Endotracheal tube placement. EXAM: PORTABLE CHEST 1 VIEW COMPARISON:  Earlier same day FINDINGS: Endotracheal tube tip 5.5 cm from the carina. Orogastric tube enters the abdomen. Femoral venous catheter tip in the right atrium. Slight worsening of density in the mid and lower lungs left more than right  could be edema, pneumonia or atelectasis. Upper lungs remain clear. IMPRESSION: Endotracheal tube tip 5.5 cm above the carina. Worsened bilateral lower lung density left more than  right could be edema, pneumonia or atelectasis. Electronically Signed   By: Nelson Chimes M.D.   On: 07/31/2019 14:32   DG Chest Port 1 View  Result Date: 08/02/2019 CLINICAL DATA:  Post intubation EXAM: PORTABLE CHEST 1 VIEW COMPARISON:  Radiograph February 20, 2018 FINDINGS: Endotracheal tube terminates in the mid trachea, 5 cm from the carina. Transesophageal tube tip and side port distal to the GE junction, terminating below the level of imaging. Telemetry leads and pacer pads overlie the chest. Some bandlike opacity in the left mid lung likely reflect subsegmental atelectasis. No consolidation, features of edema, pneumothorax, or effusion. The cardiomediastinal contours are unremarkable. No acute osseous or soft tissue abnormality. IMPRESSION: Lines and tubes as above. Subsegmental atelectasis in the left mid lung. No other acute cardiopulmonary abnormality. Electronically Signed   By: Lovena Le M.D.   On: 07/16/2019 01:59   DG Abd Portable 1V  Result Date: 08/18/2019 CLINICAL DATA:  OG tube placement EXAM: PORTABLE ABDOMEN - 1 VIEW COMPARISON:  08/16/2019 FINDINGS: The tip of the OG tube projects over the gastric body. There is a femoral approach dialysis catheter that appears well position. The stomach is distended. The bowel gas pattern is otherwise nonspecific. IMPRESSION: Enteric tube terminates over the stomach. Electronically Signed   By: Constance Holster M.D.   On: 08/18/2019 00:21   DG Abd Portable 1V  Result Date: 08/16/2019 CLINICAL DATA:  Enteric tube placement EXAM: PORTABLE ABDOMEN - 1 VIEW COMPARISON:  None. FINDINGS: Enteric tube terminates within the body of the stomach. Femoral approach central line tip overlies the right atrium. Visualized bowel gas pattern is unremarkable. IMPRESSION: Enteric tube  terminates within the stomach. Electronically Signed   By: Macy Mis M.D.   On: 08/16/2019 16:34   ECHOCARDIOGRAM COMPLETE  Result Date: 08/13/2019   ECHOCARDIOGRAM REPORT   Patient Name:   [name] Date of Exam: 08/13/2019 Medical Rec #:  [id number]         Height:       69.0 in Accession #:    [id number]        Weight:       193.8 lb Date of Birth:  1965/09/13         BSA:          2.04 m Patient Age:    30 years          BP:           92/66 mmHg Patient Gender: M                 HR:           97 bpm. Exam Location:  Inpatient Procedure: 2D Echo, Cardiac Doppler and Color Doppler Indications:    Cardiac Arrest 427.5  History:        Patient has no prior history of Echocardiogram examinations.  Sonographer:    Jonelle Sidle Dance Referring Phys: [id number] PAULA B SIMPSON  Sonographer Comments: Echo performed with patient supine and on artificial respirator. Image acquisition challenging due to respiratory motion. IMPRESSIONS  1. Left ventricular ejection fraction, by visual estimation, is 30 to 35%. The left ventricle has moderately decreased function. There is mildly increased left ventricular hypertrophy.  2. Left ventricular diastolic parameters are consistent with Grade I diastolic dysfunction (impaired relaxation).  3. The left ventricle demonstrates global hypokinesis.  4. Global right ventricle has mildly reduced systolic function.The right ventricular size is normal. No increase in right ventricular wall thickness.  5.  Left atrial size was normal.  6. Right atrial size was normal.  7. The pericardial effusion is circumferential.  8. Trivial pericardial effusion is present.  9. The mitral valve is normal in structure. No evidence of mitral valve regurgitation. 10. The tricuspid valve is normal in structure. 11. The tricuspid valve is normal in structure. Tricuspid valve regurgitation is trivial. 12. The aortic valve is normal in structure. Aortic valve regurgitation is not visualized. 13. The pulmonic  valve was grossly normal. Pulmonic valve regurgitation is not visualized. 14. Normal pulmonary artery systolic pressure. 15. The tricuspid regurgitant velocity is 2.32 m/s, and with an assumed right atrial pressure of 3 mmHg, the estimated right ventricular systolic pressure is normal at 24.5 mmHg. 16. The inferior vena cava is normal in size with greater than 50% respiratory variability, suggesting right atrial pressure of 3 mmHg. 17. No significant change from prior study. 18. Prior images reviewed side by side. FINDINGS  Left Ventricle: Left ventricular ejection fraction, by visual estimation, is 30 to 35%. The left ventricle has moderately decreased function. The left ventricle demonstrates global hypokinesis. The left ventricular internal cavity size was the left ventricle is normal in size. There is mildly increased left ventricular hypertrophy. Concentric left ventricular hypertrophy. Left ventricular diastolic parameters are consistent with Grade I diastolic dysfunction (impaired relaxation). Right Ventricle: The right ventricular size is normal. No increase in right ventricular wall thickness. Global RV systolic function is has mildly reduced systolic function. The tricuspid regurgitant velocity is 2.32 m/s, and with an assumed right atrial pressure of 3 mmHg, the estimated right ventricular systolic pressure is normal at 24.5 mmHg. Left Atrium: Left atrial size was normal in size. Right Atrium: Right atrial size was normal in size Pericardium: Trivial pericardial effusion is present. The pericardial effusion is circumferential. Mitral Valve: The mitral valve is normal in structure. No evidence of mitral valve regurgitation. Tricuspid Valve: The tricuspid valve is normal in structure. Tricuspid valve regurgitation is trivial. Aortic Valve: The aortic valve is normal in structure. Aortic valve regurgitation is not visualized. Pulmonic Valve: The pulmonic valve was grossly normal. Pulmonic valve regurgitation  is not visualized. Pulmonic regurgitation is not visualized. Aorta: The aortic root and ascending aorta are structurally normal, with no evidence of dilitation. Venous: The inferior vena cava is normal in size with greater than 50% respiratory variability, suggesting right atrial pressure of 3 mmHg. IAS/Shunts: No atrial level shunt detected by color flow Doppler. Additional Comments: Little change from 06/08/2019 (note different MRN).  LEFT VENTRICLE PLAX 2D LVIDd:         4.20 cm  Diastology LVIDs:         3.90 cm  LV e' lateral:   5.78 cm/s LV PW:         1.30 cm  LV E/e' lateral: 7.5 LV IVS:        1.20 cm  LV e' medial:    5.78 cm/s LVOT diam:     2.10 cm  LV E/e' medial:  7.5 LV SV:         13 ml LV SV Index:   6.06 LVOT Area:     3.46 cm  RIGHT VENTRICLE            IVC RV Basal diam:  2.10 cm    IVC diam: 1.40 cm RV S prime:     8.24 cm/s TAPSE (M-mode): 1.6 cm LEFT ATRIUM           Index  RIGHT ATRIUM          Index LA diam:      2.70 cm 1.32 cm/m  RA Area:     9.52 cm LA Vol (A4C): 26.9 ml 13.20 ml/m RA Volume:   18.20 ml 8.93 ml/m  AORTIC VALVE LVOT Vmax:   80.20 cm/s LVOT Vmean:  53.900 cm/s LVOT VTI:    0.126 m  AORTA Ao Root diam: 3.50 cm Ao Asc diam:  2.50 cm MITRAL VALVE                        TRICUSPID VALVE MV Area (PHT): 3.53 cm             TR Peak grad:   21.5 mmHg MV PHT:        62.35 msec           TR Vmax:        232.00 cm/s MV Decel Time: 215 msec MV E velocity: 43.30 cm/s 103 cm/s  SHUNTS MV A velocity: 59.60 cm/s 70.3 cm/s Systemic VTI:  0.13 m MV E/A ratio:  0.73       1.5       Systemic Diam: 2.10 cm  Dani Gobble Croitoru MD Electronically signed by Sanda Klein MD Signature Date/Time: 08/13/2019/2:38:10 PM    Final    [place] EKG SITE RITE  Result Date: 08/18/2019 If Site Rite image not attached, placement could not be confirmed due to current cardiac rhythm.   Micro Data:   Results for orders placed or performed during the hospital encounter of 07/30/2019  Respiratory Panel by RT  PCR (Flu A&B, Covid) - Nasopharyngeal Swab     Status: None   Collection Time: 08/15/2019  1:46 AM   Specimen: Nasopharyngeal Swab  Result Value Ref Range Status   SARS Coronavirus 2 by RT PCR NEGATIVE NEGATIVE Final    Comment: (NOTE) SARS-CoV-2 target nucleic acids are NOT DETECTED. The SARS-CoV-2 RNA is generally detectable in upper respiratoy specimens during the acute phase of infection. The lowest concentration of SARS-CoV-2 viral copies this assay can detect is 131 copies/mL. A negative result does not preclude SARS-Cov-2 infection and should not be used as the sole basis for treatment or other patient management decisions. A negative result may occur with  improper specimen collection/handling, submission of specimen other than nasopharyngeal swab, presence of viral mutation(s) within the areas targeted by this assay, and inadequate number of viral copies (<131 copies/mL). A negative result must be combined with clinical observations, patient history, and epidemiological information. The expected result is Negative. Fact Sheet for Patients:  PinkCheek.be Fact Sheet for Healthcare Providers:  GravelBags.it This test is not yet ap proved or cleared by the [place] FDA and  has been authorized for detection and/or diagnosis of SARS-CoV-2 by FDA under an Emergency Use Authorization (EUA). This EUA will remain  in effect (meaning this test can be used) for the duration of the COVID-19 declaration under Section 564(b)(1) of the Act, 21 U.S.C. section 360bbb-3(b)(1), unless the authorization is terminated or revoked sooner.    Influenza A by PCR NEGATIVE NEGATIVE Final   Influenza B by PCR NEGATIVE NEGATIVE Final    Comment: (NOTE) The Xpert Xpress SARS-CoV-2/FLU/RSV assay is intended as an aid in  the diagnosis of influenza from Nasopharyngeal swab specimens and  should not be used as a sole basis for treatment. Nasal  washings and  aspirates are unacceptable for Xpert Xpress SARS-CoV-2/FLU/RSV  testing. Fact  Sheet for Patients: PinkCheek.be Fact Sheet for Healthcare Providers: GravelBags.it This test is not yet approved or cleared by the [place] FDA and  has been authorized for detection and/or diagnosis of SARS-CoV-2 by  FDA under an Emergency Use Authorization (EUA). This EUA will remain  in effect (meaning this test can be used) for the duration of the  Covid-19 declaration under Section 564(b)(1) of the Act, 21  U.S.C. section 360bbb-3(b)(1), unless the authorization is  terminated or revoked. Performed at [facility], [place] [address], [place], [place] [address]   MRSA PCR Screening     Status: Abnormal   Collection Time: 07/25/2019  7:35 AM   Specimen: Nasal Mucosa; Nasopharyngeal  Result Value Ref Range Status   MRSA by PCR POSITIVE (A) NEGATIVE Final    Comment:        The GeneXpert MRSA Assay (FDA approved for NASAL specimens only), is one component of a comprehensive MRSA colonization surveillance program. It is not intended to diagnose MRSA infection nor to guide or monitor treatment for MRSA infections. RESULT CALLED TO, READ BACK BY AND VERIFIED WITH: Mikal Plane RN 9:40 08/10/2019 ([email or web address]) Performed at [facility], [place] [address], [place], [place] [address]   Culture, respiratory (non-expectorated)     Status: None   Collection Time: 08/08/2019  7:38 AM   Specimen: Tracheal Aspirate; Respiratory  Result Value Ref Range Status   Specimen Description TRACHEAL ASPIRATE  Final   Special Requests NONE  Final   Gram Stain   Final    FEW WBC PRESENT, PREDOMINANTLY PMN MODERATE GRAM POSITIVE COCCI IN PAIRS IN CLUSTERS MODERATE GRAM NEGATIVE RODS RARE GRAM NEGATIVE COCCOBACILLI Performed at [facility], [place] [address], [place], [place] [address]    Culture   Final    MODERATE METHICILLIN RESISTANT  STAPHYLOCOCCUS AUREUS   Report Status 08/15/2019 FINAL  Final   Organism ID, Bacteria METHICILLIN RESISTANT STAPHYLOCOCCUS AUREUS  Final      Susceptibility   Methicillin resistant staphylococcus aureus - MIC*    CIPROFLOXACIN >=8 RESISTANT Resistant     ERYTHROMYCIN >=8 RESISTANT Resistant     GENTAMICIN <=0.5 SENSITIVE Sensitive     OXACILLIN >=4 RESISTANT Resistant     TETRACYCLINE <=1 SENSITIVE Sensitive     VANCOMYCIN 1 SENSITIVE Sensitive     TRIMETH/SULFA <=10 SENSITIVE Sensitive     CLINDAMYCIN <=0.25 SENSITIVE Sensitive     RIFAMPIN <=0.5 SENSITIVE Sensitive     Inducible Clindamycin NEGATIVE Sensitive     * MODERATE METHICILLIN RESISTANT STAPHYLOCOCCUS AUREUS  Culture, blood (routine x 2)     Status: None   Collection Time: 07/31/2019  9:46 AM   Specimen: BLOOD RIGHT ARM  Result Value Ref Range Status   Specimen Description BLOOD RIGHT ARM  Final   Special Requests   Final    AEROBIC BOTTLE ONLY Blood Culture results may not be optimal due to an inadequate volume of blood received in culture bottles Performed at [facility], [address] [place], [place] [address]    Culture NO GROWTH 5 DAYS  Final   Report Status 08/17/2019 FINAL  Final  Culture, blood (routine x 2)     Status: None   Collection Time: 08/02/2019  9:47 AM   Specimen: BLOOD  Result Value Ref Range Status   Specimen Description BLOOD RIGHT ANTECUBITAL  Final   Special Requests   Final    AEROBIC BOTTLE ONLY Blood Culture results may not  be optimal due to an inadequate volume of blood received in culture bottles Performed at [facility] [address] [place], [place] [address]    Culture NO GROWTH 5 DAYS  Final   Report Status 08/17/2019 FINAL  Final    Antimicrobials:   Antibiotics Given (last 72 hours)    Date/Time Action Medication Dose Rate   08/22/19 1811 New Bag/Given   vancomycin (VANCOCIN) IVPB 1000 mg/200 mL premix 1,000 mg 200 mL/hr   08/22/19 2053 New Bag/Given    ceFEPIme (MAXIPIME) 2 g in sodium chloride 0.9 % 100 mL IVPB 2 g 200 mL/hr   08/23/19 1124 New Bag/Given  [last hour of HD]   vancomycin (VANCOREADY) IVPB 750 mg/150 mL 750 mg 150 mL/hr      Interim history/subjective:  Patient states he did not sleep well last night, is currently sleepy, resting in bed getting HD, continues to require pressors.  Denies pain.  Objective   Blood pressure 96/65, pulse (!) 118, temperature (!) 97.4 F (36.3 C), temperature source Oral, resp. rate 17, height 5\' 9"  (1.753 m), weight 93.3 kg, SpO2 100 %.        Intake/Output Summary (Last 24 hours) at 08/23/2019 1310 Last data filed at 08/23/2019 1150 Gross per 24 hour  Intake 1648.19 ml  Output 3000 ml  Net -1351.81 ml   Filed Weights   08/23/19 0500 08/23/19 0700 08/23/19 1204  Weight: 96.2 kg 96.3 kg 93.3 kg    Examination: General: Awake, alert, sleepy.  Less awake than day prior. 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  Right lower extremity with 3+ pitting edema, cutaneous abrasions appreciated to patient's thigh, shin, and calf Neuro: No focal neurological deficits appreciated, cranial nerves II-XII grossly intact  Resolved Hospital Problem list   Cardiac arrest Prolonged QTC Acute hypoxic respiratory failure MRSA pneumonia Acute encephalopathy  Assessment & Plan:  Multifactorial Shock, likely cardiogenic with superimposed septic shock.  Clinically improved but persistent vasopressor requirement.  Difficult to obtain accurate blood pressure.  Patient continues to improve despite this. Given foul smell coming from leg  suspected deeper soft tissue infection or gangrene driving vasopressor requirements.  Negative Cortrosyn stimulation test 2/7.  CT leg 2/7 - for bony involvement. -Continue to wean norepinephrine as tolerated -May require surgical debridement > Consult general surgery versus orthopedics for debridement  Calciphylaxis, chronic, acutely worse:  Patientdenies significant pain from his calciphylaxis. Strong suspicion is secondary to patient's comorbidities that he lacks proper sensation to his lower extremities.  Adequate pain controlwith Dilaudid, gabapentin. -Continue gabapentin200mg  BID - Cymbalta 20 mg daily - Dilaudid 4mg  q6 made as needed - Continue Sodium Thiosulfate 25mg  IV qMWF with hemodialysis  ESRDon HD MWF: HD 2/8 - Nephrology consulted - appreciate recommendations  Anemia of Chronic Disease, chronic, acutely worse:Hgb 7.6> 1.8> 7.5 on 2/8. Transfused 2 units RBC 01/28-01/29 and 2 more on 08/17/2019 - f/u AM CBC -Transfuse Hgb <7 with RBC. - Aranesp IV 125mcg every Saturday  Type 2Diabetes Mellitus:Blood sugar readings more stable 113-168 over the last 24 hours.  Patient asymptomatic. - mSSI  - Levemir 8 units BID - CBGs q4  Best practice:  Diet: Renal/carb modified with fluid restriction Pain/Anxiety/Delirium protocol (if  indicated): Dilaudid 4 mg every 6 as needed, Cymbalta 20 mg daily, gabapentin 200 mg nightly VAP protocol (if indicated): None DVT prophylaxis: Heparin GI prophylaxis: None at this time Glucose control: Levemir 8 units twice daily, moderate sliding scale insulin Mobility: Secondary to pain/BKA is limited Code Status: DNR Family Communication: None Disposition: Transition to floor once stable  Labs   CBC: Recent Labs  Lab 08/20/19 0345 08/21/19 0443 08/22/19 0427 08/22/19 0946 08/23/19 0202  WBC 19.7* 13.7* 13.1* 16.6* 14.8*  HGB 9.3* 8.0* 7.6* 7.8* 7.5*  HCT 31.6* 26.2* 25.9* 26.1* 25.6*  MCV 93.5 92.3 92.2 91.6 92.4  PLT 205  200 233 260 0000000    Basic Metabolic Panel: Recent Labs  Lab 08/19/19 0500 08/19/19 0500 08/19/19 1806 08/19/19 1806 08/19/19 2228 08/19/19 2228 08/20/19 0345 08/20/19 0730 08/20/19 0911 08/21/19 0443 08/21/19 0443 08/21/19 1125 08/21/19 2230 08/22/19 0427 08/22/19 0946 08/22/19 2300 08/23/19 0202  NA 144   < > 137   < > 139  --   --  139  --  140  --   --   --   --  140  --  136  K 3.9   < > 4.3   < > 4.0  --   --  4.2  --  4.2  --   --   --   --  4.6  --  4.5  CL 105   < > 100   < > 102  --   --  100  --  102  --   --   --   --  99  --  93*  CO2 20*   < > 18*   < > 17*  --   --  17*  --  19*  --   --   --   --  18*  --  20*  GLUCOSE 56*   < > 168*   < > 160*   < >  --  133*   < > 123*   < > 135* 105*  --  153* 150* 139*  BUN 40*   < > 50*   < > 50*  --   --  57*  --  42*  --   --   --   --  64*  --  75*  CREATININE 2.92*   < > 3.66*   < > 3.77*  --   --  3.87*  --  3.62*  --   --   --   --  5.19*  --  5.92*  CALCIUM 8.0*  7.4*   < > 7.4*   < > 7.5*  --   --  8.0*  --  7.4*  --   --   --   --  7.7*  --  7.6*  MG 2.6*  --   --   --  2.5*  --  2.4  --   --  2.2  --   --   --  2.4  --   --   --   PHOS 3.5  --  3.9  --   --   --   --  4.7*  --   --   --   --   --   --  4.7*  --  4.8*   < > = values in this interval not displayed.   GFR: Estimated Creatinine Clearance: 15.9 mL/min (A) (by C-G formula based on SCr of 5.92 mg/dL (  H)). Recent Labs  Lab 08/21/19 0443 08/22/19 0427 08/22/19 0946 08/23/19 0202  WBC 13.7* 13.1* 16.6* 14.8*    Liver Function Tests: Recent Labs  Lab 08/19/19 0500 08/19/19 1806 08/20/19 0730 08/22/19 0946 08/23/19 0202  ALBUMIN 1.8* 1.7* 2.0* 2.1* 2.0*   No results for input(s): LIPASE, AMYLASE in the last 168 hours. No results for input(s): AMMONIA in the last 168 hours.  ABG    Component Value Date/Time   PHART 7.422 08/16/2019 1520   PCO2ART 33.7 08/16/2019 1520   PO2ART 177.0 (H) 08/16/2019 1520   HCO3 21.9 08/16/2019 1520    TCO2 23 08/16/2019 1520   ACIDBASEDEF 2.0 08/16/2019 1520   O2SAT 100.0 08/16/2019 1520     Coagulation Profile: No results for input(s): INR, PROTIME in the last 168 hours.  Cardiac Enzymes: No results for input(s): CKTOTAL, CKMB, CKMBINDEX, TROPONINI in the last 168 hours.  HbA1C: Hgb A1c MFr Bld  Date/Time Value Ref Range Status  07/24/2019 07:30 PM 5.6 4.8 - 5.6 % Final    Comment:    (NOTE) Pre diabetes:          5.7%-6.4% Diabetes:              >6.4% Glycemic control for   <7.0% adults with diabetes     CBG: Recent Labs  Lab 08/23/19 0144 08/23/19 0343 08/23/19 0805 08/23/19 1134 08/23/19 1159  GLUCAP 140* 110* 71 39* 70    Review of Systems:   See HPI  Past Medical History  He,  has a past medical history of Anemia due to chronic kidney disease, Cardiac arrest ([place]) (08/07/2019), End-stage renal disease on hemodialysis ([place]), and Personal history of MRSA (methicillin resistant Staphylococcus aureus) (07/2020).   Surgical History    Past Surgical History:  Procedure Laterality Date  . IR FLUORO GUIDE CV LINE LEFT  08/19/2019  . IR US GUIDE VASC ACCESS LEFT  08/19/2019     Social History   reports that he has been smoking cigarettes. He has never used smokeless tobacco. He reports current alcohol use. He reports current drug use. Drug: Marijuana.   Family History   His family history is not on file.   Allergies Allergies  Allergen Reactions  . Amlodipine Itching  . Dacarbazine And Related Nausea And Vomiting  . Fentanyl Itching  . Hydrocodone Nausea And Vomiting  . Propofol Itching     Home Medications  Prior to Admission medications   Medication Sig Start Date End Date Taking? Authorizing Provider  b complex-vitamin c-folic acid (NEPHRO-VITE) 0.8 MG TABS tablet Take 1 tablet by mouth daily. 05/18/19   [provider]  calcium acetate (PHOSLO) 667 MG tablet Take 1,334 mg by mouth 3 (three) times daily. 07/21/19   [provider]    COLCRYS 0.6 MG tablet Take 0.6 mg by mouth daily as needed (gout flare).  03/24/19   [provider]  hydrALAZINE (APRESOLINE) 25 MG tablet Take 37.5 mg by mouth 3 (three) times daily. 03/29/19   [provider]  isosorbide mononitrate (IMDUR) 30 MG 24 hr tablet Take 30 mg by mouth daily. 03/29/19   [provider]  LEVEMIR FLEXTOUCH 100 UNIT/ML Pen Inject 25 Units into the skin daily. 07/30/19   [provider]  metoprolol succinate (TOPROL-XL) 50 MG 24 hr tablet Take 50 mg by mouth daily. 03/29/19   [provider]  NOVOLIN 70/30 FLEXPEN (70-30) 100 UNIT/ML PEN Inject 20 Units into the skin daily. 03/11/19   [provider]  NOVOLOG FLEXPEN 100 UNIT/ML FlexPen Inject 1 Units into the skin at bedtime. 07/30/19   [provider]  oxyCODONE-acetaminophen (PERCOCET) 7.5-325 MG tablet Take 1 tablet by mouth every 4 (four) hours as needed. 07/26/19   [provider]  oxyCODONE-acetaminophen (PERCOCET/ROXICET) 5-325 MG tablet Take 1 tablet by mouth 4 (four) times daily as needed. 07/19/19   [provider]  pantoprazole (PROTONIX) 40 MG tablet Take 40 mg by mouth daily. 06/23/19   [provider]  RENVELA 800 MG tablet Take 2,400 mg by mouth 3 (three) times daily. 03/11/19   [provider]  rosuvastatin (CRESTOR) 5 MG tablet Take 5 mg by mouth daily. 03/11/19   [provider]  TRULICITY [id number] [id number] SOPN Inject 1 mg into the skin once a week. 07/30/19   [provider]  VELPHORO 500 MG chewable tablet Chew 500 mg by mouth 3 (three) times daily. 06/08/19   [provider]  VENTOLIN HFA 108 (90 Base) MCG/ACT inhaler Inhale 2 puffs into the lungs 4 (four) times daily as needed for wheezing or shortness of breath. 06/23/19   [provider]     Critical care time: [place], [place], PGY-2 08/23/2019 3:59 PM

## 2019-08-23 NOTE — NC FL2 (Signed)
[name] LEVEL OF CARE SCREENING TOOL     IDENTIFICATION  Patient Name: [name] Birthdate: 1965-09-25 Sex: male Admission Date (Current Location): 08/11/2019  [facility] and [place] Number:  Herbalist and Address:  [facility] [place] [place] [address], [place], [place] [address]      Provider Number: [id number]  Attending Physician Name and Address:  Kipp Brood, MD  Relative Name and Phone Number:       Current Level of Care: Hospital Recommended Level of Care: [place] Prior Approval Number:    Date Approved/Denied:   PASRR Number: [id number]  Discharge Plan: SNF    Current Diagnoses: Patient Active Problem List   Diagnosis Date Noted  . Acute respiratory failure ([place])   . Cardiac arrest ([place]) 08/13/2019  . End-stage renal disease on hemodialysis ([place])   . Hyperkalemia   . Anemia due to chronic kidney disease, on chronic dialysis ([place])   . Encephalopathy acute   . Calciphylaxis     Orientation RESPIRATION BLADDER Height & Weight     Self, Time, Situation, Place  Normal Continent Weight: 212 lb 4.9 oz (96.3 kg) Height:  5\' 9"  (175.3 cm)  BEHAVIORAL SYMPTOMS/MOOD NEUROLOGICAL BOWEL NUTRITION STATUS      Continent Diet(see d/c summary)  AMBULATORY STATUS COMMUNICATION OF NEEDS Skin   Extensive Assist Verbally Other (Comment)(Non pressure wound)                       Personal Care Assistance Level of Assistance  Bathing, Dressing, Total care, Feeding Bathing Assistance: Maximum assistance Feeding assistance: Limited assistance Dressing Assistance: Limited assistance Total Care Assistance: Maximum assistance   Functional Limitations Info             SPECIAL CARE FACTORS FREQUENCY  PT (By licensed PT), OT (By licensed OT)     PT Frequency: 5x a week OT Frequency: 5x a week            Contractures Contractures Info: Not present    Additional Factors Info  Code Status,  Allergies Code Status Info: DNR Allergies Info: Amlodipine, Dacarbazine And Related, Fentanyl, Hydrocodone, Propofol           Current Medications (08/23/2019):  This is the current hospital active medication list Current Facility-Administered Medications  Medication Dose Route Frequency Provider Last Rate Last Admin  . heparin 1000 UNIT/ML injection           . 0.9 %  sodium chloride infusion  100 mL Intravenous PRN Justin Mend, MD 10 mL/hr at 08/22/19 2300 Rate Verify at 08/22/19 2300  . 0.9 %  sodium chloride infusion  100 mL Intravenous PRN Justin Mend, MD      . albuterol (PROVENTIL) (2.5 MG/3ML) 0.083% nebulizer solution 2.5 mg  2.5 mg Nebulization Q4H PRN Jennelle Human B, NP      . bisacodyl (DULCOLAX) suppository 10 mg  10 mg Rectal Daily PRN Jennelle Human B, NP      . ceFEPIme (MAXIPIME) 1 g in sodium chloride 0.9 % 100 mL IVPB  1 g Intravenous Q24H Donnamae Jude, [facility]      . Chlorhexidine Gluconate Cloth 2 % PADS 6 each  6 each Topical Daily Kipp Brood, MD   6 each at 08/22/19 0913  . Chlorhexidine Gluconate Cloth 2 % PADS 6 each  6 each Topical Q0600 Justin Mend, MD      . cinacalcet [facility]) tablet  30 mg  30 mg Oral Q breakfast Justin Mend, MD   30 mg at 08/23/19 0800  . Darbepoetin Alfa (ARANESP) injection 150 mcg  150 mcg Intravenous Q Sat-HD Justin Mend, MD   Stopped at 08/21/19 1612  . DULoxetine (CYMBALTA) DR capsule 20 mg  20 mg Oral Daily Milus Banister C, DO   20 mg at 08/22/19 0912  . feeding supplement (NEPRO CARB STEADY) liquid 237 mL  237 mL Oral TID BM Kipp Brood, MD   237 mL at 08/22/19 2256  . feeding supplement (PRO-STAT SUGAR FREE 64) liquid 30 mL  30 mL Oral BID Kipp Brood, MD   30 mL at 08/22/19 2147  . gabapentin (NEURONTIN) 250 MG/5ML solution 200 mg  200 mg Oral QHS Agarwala, Einar Grad, MD   200 mg at 08/22/19 2201  . heparin injection 1,000 Units  1,000 Units Dialysis PRN Justin Mend, MD   6,200 Units at 08/20/19  1116  . HYDROmorphone (DILAUDID) tablet 4 mg  4 mg Oral Q6H PRN Milus Banister C, DO   4 mg at 08/22/19 1951  . hydrOXYzine (ATARAX) 10 MG/5ML syrup 25 mg  25 mg Oral TID PRN Kipp Brood, MD      . influenza vac split quadrivalent PF (FLUARIX) injection 0.5 mL  0.5 mL Intramuscular Prior to discharge Agarwala, Einar Grad, MD      . insulin aspart (novoLOG) injection 0-15 Units  0-15 Units Subcutaneous Q6H Whiteheart, Cristal Ford, NP   2 Units at 08/23/19 0153  . insulin detemir (LEVEMIR) injection 8 Units  8 Units Subcutaneous BID Milus Banister C, DO   8 Units at 08/22/19 2148  . lidocaine (PF) (XYLOCAINE) 1 % injection 5 mL  5 mL Intradermal PRN Justin Mend, MD      . lidocaine (PF) (XYLOCAINE) 1 % injection    PRN Markus Daft, MD   10 mL at 08/19/19 1706  . lidocaine-prilocaine (EMLA) cream 1 application  1 application Topical PRN Justin Mend, MD      . midodrine (PROAMATINE) tablet 10 mg  10 mg Oral TID WC Einar Grad, RPH   10 mg at 08/23/19 0800  . multivitamin (RENA-VIT) tablet 1 tablet  1 tablet Oral QHS Kipp Brood, MD   1 tablet at 08/22/19 2147  . norepinephrine (LEVOPHED) 16 mg in 253mL premix infusion  0-50 mcg/min Intravenous Continuous Frederik Pear, MD 3.75 mL/hr at 08/23/19 0659 4 mcg/min at 08/23/19 0659  . pentafluoroprop-tetrafluoroeth (GEBAUERS) aerosol 1 application  1 application Topical PRN Justin Mend, MD      . pneumococcal 23 valent vaccine (PNEUMOVAX-23) injection 0.5 mL  0.5 mL Intramuscular Prior to discharge Kipp Brood, MD      . senna-docusate (Senokot-S) tablet 1 tablet  1 tablet Oral Daily PRN Milus Banister C, DO      . sodium chloride flush (NS) 0.9 % injection 10-40 mL  10-40 mL Intracatheter Q12H Kipp Brood, MD   10 mL at 08/21/19 2124  . sodium chloride flush (NS) 0.9 % injection 10-40 mL  10-40 mL Intracatheter PRN Agarwala, Einar Grad, MD      . sodium thiosulfate 25 g in sodium chloride 0.9 % 200 mL Infusion for  Calciphylaxis  25 g Intravenous Q M,W,F-HD Harrie Jeans C, MD 200 mL/hr at 08/20/19 1106 25 g at 08/20/19 1106  . vancomycin (VANCOREADY) IVPB 750 mg/150 mL  750 mg Intravenous Q M,W,F-HD Donnamae Jude, [facility]  Discharge Medications: Please see discharge summary for a list of discharge medications.  Relevant Imaging Results:  Relevant Lab Results:   Additional Information SSN [id number]  [place], LCSW

## 2019-08-23 NOTE — TOC Initial Note (Signed)
Transition of Care [facility]) - Initial/Assessment Note    Patient Details  Name: [name] MRN: [id number] Date of Birth: 1966/04/27  Transition of [facility]) CM/SW Contact:    Arvella Merles, LCSW Phone Number: 08/23/2019, 10:42 AM  Clinical Narrative:                 CSW received consult for possible SNF placement at time of discharge. CSW spoke with patient regarding PT recommendation of SNF placement at time of discharge. Patient expressed understanding of PT recommendation and is agreeable to SNF placement at time of discharge. CSW asked patient if there is anyone he would like contacted to further discuss the plan, patient declined. CSW discussed insurance authorization process and provided Medicare SNF ratings list. No further questions reported at this time. CSW to continue to follow and assist with discharge planning needs.      Expected Discharge Plan: Skilled Nursing Facility Barriers to Discharge: Continued Medical Work up, Waiting for outpatient dialysis   Patient Goals and CMS Choice Patient states their goals for this hospitalization and ongoing recovery are:: to get better   Choice offered to / list presented to : Patient  Expected Discharge Plan and Services Expected Discharge Plan: [place]       Living arrangements for the past 2 months: Single Family Home                                      Prior Living Arrangements/Services Living arrangements for the past 2 months: Single Family Home Lives with:: Self Patient language and need for interpreter reviewed:: Yes Do you feel safe going back to the place where you live?: Yes        Care giver support system in place?: Yes (comment)   Criminal Activity/Legal Involvement Pertinent to Current Situation/Hospitalization: No - Comment as needed  Activities of Daily Living Home Assistive Devices/Equipment: None ADL Screening (condition at time of admission) Patient's cognitive  ability adequate to safely complete daily activities?: No Is the patient deaf or have difficulty hearing?: No Does the patient have difficulty seeing, even when wearing glasses/contacts?: No Does the patient have difficulty concentrating, remembering, or making decisions?: Yes Patient able to express need for assistance with ADLs?: No Does the patient have difficulty dressing or bathing?: Yes Independently performs ADLs?: No Communication: Dependent Is this a change from baseline?: Change from baseline, expected to last <3 days Dressing (OT): Dependent Is this a change from baseline?: Pre-admission baseline Grooming: Dependent Is this a change from baseline?: Pre-admission baseline Feeding: Dependent Is this a change from baseline?: Pre-admission baseline Bathing: Dependent Is this a change from baseline?: Pre-admission baseline Toileting: Dependent Is this a change from baseline?: Pre-admission baseline In/Out Bed: Dependent Is this a change from baseline?: Pre-admission baseline Walks in Home: Dependent Is this a change from baseline?: Pre-admission baseline Does the patient have difficulty walking or climbing stairs?: Yes Weakness of Legs: Both Weakness of Arms/Hands: Both  Permission Sought/Granted Permission sought to share information with : Facility Art therapist granted to share information with : Yes, Verbal Permission Granted     Permission granted to share info w AGENCY: SNF        Emotional Assessment   Attitude/Demeanor/Rapport: Unable to Assess Affect (typically observed): Unable to Assess   Alcohol / Substance Use: Not Applicable Psych Involvement: No (comment)  Admission diagnosis:  Cardiac arrest ([place]) [I46.9]  Hyperkalemia [E87.5] Idiopathic hypotension [I95.0] End-stage renal disease on hemodialysis ([place]) [N18.6, Z99.2] Coma, unspecified coma depth (HCC) [R40.20] Anemia due to chronic kidney disease, on chronic dialysis ([place])  [N18.6, D63.1, Z99.2] Patient Active Problem List   Diagnosis Date Noted  . Acute respiratory failure ([place])   . Cardiac arrest ([place]) 08/02/2019  . End-stage renal disease on hemodialysis ([place])   . Hyperkalemia   . Anemia due to chronic kidney disease, on chronic dialysis ([place])   . Encephalopathy acute   . Calciphylaxis    PCP:  [place], [place]:   [facility] [place], [address] [place] [address] Phone: [phone] Fax: [phone]     Social Determinants of Health (SDOH) Interventions    Readmission Risk Interventions No flowsheet data found.

## 2019-08-23 NOTE — Progress Notes (Signed)
Physical Therapy Treatment Patient Details Name: [name] MRN: [id number] DOB: 1965/10/11 Today's Date: 08/23/2019    History of Present Illness 54 y.o. male with history of ESRD on HD with witnessed cardiac arrest, obtained ROSC after 3 minutes.  Required intubation for ongoing airway protection, ongoing unresponsiveness, and hypotension requiring vasopressor support.  Also with MRSA pneumonia on vancomycin.  Patient extubated 08/18/2019, off pressors.    PT Comments    Patient reports pain in left hip. Requires Max A of 2 for bed mobility. Able to initiate scooting along side bed with max A of 2. Pt with difficulty following simple 1-2 step commands demonstrating decreased initiation and sequencing. Requires repetition and step by step cues. Maxi sky lift in room is broken. Pt noted to have decreased knee flexion in left residual limb as well as cold temperature. RN notified. Not sure of baseline. Encouraged there ex while laying in bed. Placed in chair position. VSS with BP on softer side but pt asymptomatic. Will follow.   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 Difficulty problem solving.  Transfers                 General transfer  comment: Deferred due to pain and soft BP  Ambulation/Gait                 Stairs             Wheelchair Mobility    Modified Rankin (Stroke Patients Only)       Balance Overall balance assessment: Needs assistance Sitting-balance support: Single extremity supported(foot supported) Sitting balance-Leahy Scale: Fair Sitting balance - Comments: Min guard for safety, needing at least 1 UE support. Practiced scooting along side bed with Max A of 2 with emphasize on WB through RLE and forwards trunk lean. Postural control: Posterior lean                                  Cognition Arousal/Alertness: Awake/alert Behavior During Therapy: Flat affect Overall Cognitive Status: No family/caregiver present to determine baseline cognitive functioning Area of Impairment: Orientation;Attention;Memory;Following commands;Safety/judgement;Awareness;Problem solving                 Orientation Level: Disoriented to;Time;Situation Current Attention Level: Sustained Memory: Decreased recall of precautions;Decreased short-term memory Following Commands: Follows one step commands inconsistently;Follows one step commands with increased time Safety/Judgement: Decreased awareness of safety;Decreased awareness of deficits Awareness: Intellectual Problem Solving: Slow processing;Decreased initiation;Difficulty sequencing;Requires verbal cues General Comments: Disoriented to date, situation, Decreased awareness, problem solving and sequencing. Slow processing.      Exercises Amputee Exercises Quad Sets: Both;10 reps;Supine Knee Flexion: PROM;Left(30 second hold) Knee Extension: Right;10 reps;Seated    General Comments General comments (skin integrity, edema, etc.): VSS on  RA. Supine BP 115/59, sitting BP 92/64, supine BP post sitting 82/53, asymptomatic. HR up to 125 bpm.      Pertinent Vitals/Pain Pain Assessment: Faces Faces Pain Scale: Hurts even more Pain  Location: left hip Pain Descriptors / Indicators: Grimacing;Sore Pain Intervention(s): Repositioned;Monitored during session    Home Living                      Prior Function            PT Goals (current goals can now be found in the care plan section) Progress towards PT goals: Not progressing toward goals - comment(due to pain, fatigue)    Frequency    Min 2X/week      PT Plan Current plan remains appropriate    Co-evaluation              AM-PAC PT "6 Clicks" Mobility   Outcome Measure  Help needed turning from your back to your side while in a flat bed without using bedrails?: Total Help needed moving from lying on your back to sitting on the side of a flat bed without using bedrails?: Total Help needed moving to and from a bed to a chair (including a wheelchair)?: Total Help needed standing up from a chair using your arms (e.g., wheelchair or bedside chair)?: Total Help needed to walk in hospital room?: Total Help needed climbing 3-5 steps with a railing? : Total 6 Click Score: 6    End of Session   Activity Tolerance: Patient limited by pain;Patient limited by fatigue Patient left: in bed;with call bell/phone within reach;with bed alarm set Nurse Communication: Mobility status;Need for lift equipment;Patient requests pain meds(bandage change) PT Visit Diagnosis: Muscle weakness (generalized) (M62.81);Other abnormalities of gait and mobility (R26.89)     Time: [id number] PT Time Calculation (min) (ACUTE ONLY): 23 min  Charges:  $Therapeutic Exercise: 8-22 mins $Therapeutic Activity: 8-22 mins                     Marisa Severin, PT, DPT Acute Rehabilitation Services Pager [phone] [place] 08/23/2019, 1:10 PM

## 2019-08-23 NOTE — Progress Notes (Signed)
[facility] Progress Note  Subjective: still on pressors low dose, no c/o's  Vitals:   08/23/19 1000 08/23/19 1015 08/23/19 1030 08/23/19 1045  BP: 97/64 106/70 (!) 104/58 (!) 95/57  Pulse: 92 92 95 (!) 107  Resp: 15 12 14 14   Temp:      TempSrc:      SpO2:      Weight:      Height:        Exam: General: awake, alert, comfortable HEENT: MMM Eyes: PERRL Cor: RRR no RG Lungs: Coarse BS, normal WOB Abdomen: Soft obese Extremities: 2+ RLE edema  - much more than prior Skin: large necrotic wound left lateral upper leg w/ sig tissue loss; distal L BKA stump tissue necrotic also gangrene of the penis  Neuro: responding to conversation appropriately.  Access right femoral TDC in place with dressing c/d/i  [place] MWF   R fem TDC   450/800  2/2 bath  86kg   -mircera 200 mcg every 2 weeks last on 1/16 -Sodium thiosulfate 25 gram every treatment  -Weekly venofer -parsabiv 2.5 mg three times a week    CXR 1/28 - no active disease  Assessment/Plan:  # Cardiac arrest/ found down - Supportive care per pulm critical care--> improved  # Acute hypoxic respiratory failure - extubated to [place] now  # End-stage renal disease - Normally reported as MWF schedule  - CRRT started 1/28 and off 2/3 PM - iHD 2/5 - tolerated OK despite quite low BP on cuff.  Despite midodrine 10 TID he is still requiring NE gtt at low dose.  We are concerned that this hypotension may be an ongoing issue and limit his ability to receive ongoing dialytic therapies as an outpt. He's clearly more edematous today and needs UF with HD so we may be stuck using a low dose pressor to facilitate.  Cortisol ok.    # Shock - likely septic, MRSA growing resp cx, bcx's negative on IV abx - Improved but still on low dose pressor support + midodrine 10 TID; per above. -Completed course of vanc 2/5  # Anemia acute on chronic - Status post PRBCs - last mircera was on 1/30 at 150 mcg -Hb 7-8s now and  intermittent transfusions have been required..   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  Kelly Splinter, MD 08/23/2019, 11:01 AM        Inpatient medications: . Chlorhexidine Gluconate Cloth  6 each Topical Daily  . Chlorhexidine Gluconate Cloth  6 each Topical Q0600  . cinacalcet  30 mg Oral Q breakfast  . darbepoetin (ARANESP) injection - DIALYSIS  150 mcg Intravenous Q Sat-HD  . DULoxetine  20 mg Oral Daily  . feeding supplement (NEPRO CARB STEADY)  237 mL Oral TID BM  . feeding supplement (PRO-STAT SUGAR FREE 64)  30 mL Oral BID  . gabapentin  200 mg Oral QHS  . insulin aspart  0-15 Units Subcutaneous Q6H  . insulin detemir  8 Units Subcutaneous BID  . midodrine  10 mg Oral TID WC  . multivitamin  1 tablet Oral QHS  . sodium chloride flush  10-40 mL Intracatheter Q12H   . sodium chloride 10 mL/hr at 08/22/19 2300  . sodium chloride    . ceFEPime (MAXIPIME) IV    . norepinephrine (LEVOPHED) Adult infusion 4 mcg/min (08/23/19 0659)  . sodium thiosulfate infusion for calciphylaxis 25 g (08/20/19 1106)  . vancomycin     sodium chloride, sodium chloride, albuterol, bisacodyl, heparin, HYDROmorphone, hydrOXYzine, influenza vac split quadrivalent PF, lidocaine (PF), lidocaine (PF), lidocaine-prilocaine, pentafluoroprop-tetrafluoroeth, pneumococcal 23 valent vaccine, senna-docusate, sodium chloride flush Recent Labs  Lab 08/22/19 0946 08/22/19 0946 08/22/19 2300 08/23/19 0202  NA 140  --   --  136  K 4.6  --   --  4.5  CL 99  --   --  93*  CO2 18*  --   --  20*  GLUCOSE 153*   < > 150* 139*  BUN 64*  --   --  75*  CREATININE 5.19*  --    --  5.92*  CALCIUM 7.7*  --   --  7.6*  PHOS 4.7*  --   --  4.8*   < > = values in this interval not displayed.

## 2019-08-23 NOTE — Consult Note (Signed)
Reason for Consult: Extensive skin necrosis Referring Physician: [name] is an 54 y.o. male s/p cardiac arrest and septic shock secondary to MRSA pneumonia. HPI: Patient suffered cardiac arrest on 1/28, obtained ROSC after 3 minutes. Patient required intubation, extubated on 2/3. Treated for MRSA pneumonia. Requires ongoing vasopressors to support hypotension. End-stage renal disease on hemodialysis.  Patient has prior left below knee amputation, well healed. Reported foul smelling left leg.  Known chronic calciphylaxis, large area on anterior lateral left thigh reported to be worsening. Worsening darkening of left lower extremity, well defined color change starting on upper thigh. Skin easily comes off when changing dressing, Xeroform used currently. Patient denies bilateral leg pain.  Past Medical History:  Diagnosis Date  . Anemia due to chronic kidney disease   . Cardiac arrest ([place]) 08/08/2019  . End-stage renal disease on hemodialysis ([place])   . Personal history of MRSA (methicillin resistant Staphylococcus aureus) 07/2020   pcr positive    Past Surgical History:  Procedure Laterality Date  . IR FLUORO GUIDE CV LINE LEFT  08/19/2019  . IR US GUIDE VASC ACCESS LEFT  08/19/2019    History reviewed. No pertinent family history.  Social History:  reports that he has been smoking cigarettes. He has never used smokeless tobacco. He reports current alcohol use. He reports current drug use. Drug: Marijuana.  Allergies:  Allergies  Allergen Reactions  . Amlodipine Itching  . Dacarbazine And Related Nausea And Vomiting  . Fentanyl Itching  . Hydrocodone Nausea And Vomiting  . Propofol Itching    Medications: I have reviewed the patient's current medications.  Results for orders placed or performed during the hospital encounter of 08/08/2019 (from the past 48 hour(s))  Glucose, capillary     Status: None   Collection Time: 08/21/19  8:40 PM  Result Value Ref  Range   Glucose-Capillary 95 70 - 99 mg/dL  Glucose, random     Status: Abnormal   Collection Time: 08/21/19 10:30 PM  Result Value Ref Range   Glucose, Bld 105 (H) 70 - 99 mg/dL    Comment: Performed at [facility] [address] [place], [place] [address]  Glucose, capillary     Status: Abnormal   Collection Time: 08/21/19 11:15 PM  Result Value Ref Range   Glucose-Capillary 138 (H) 70 - 99 mg/dL  Magnesium     Status: None   Collection Time: 08/22/19  4:27 AM  Result Value Ref Range   Magnesium 2.4 1.7 - 2.4 mg/dL    Comment: Performed at [facility], [place] [address], [place], [place] [address]  CBC     Status: Abnormal   Collection Time: 08/22/19  4:27 AM  Result Value Ref Range   WBC 13.1 (H) 4.0 - 10.5 K/uL   RBC 2.81 (L) 4.22 - 5.81 MIL/uL   Hemoglobin 7.6 (L) 13.0 - 17.0 g/dL   HCT 25.9 (L) 39.0 - 52.0 %   MCV 92.2 80.0 - 100.0 fL   MCH 27.0 26.0 - 34.0 pg   MCHC 29.3 (L) 30.0 - 36.0 g/dL   RDW 20.8 (H) 11.5 - 15.5 %   Platelets 233 150 - 400 K/uL   nRBC 0.2 0.0 - 0.2 %    Comment: Performed at [facility], [place] [address], [place], [place] [address]  ACTH stimulation, 3 time points     Status: None   Collection Time: 08/22/19  4:27 AM  Result Value Ref  Range   Cortisol, Base 19.6 ug/dL    Comment: NO NORMAL RANGE ESTABLISHED FOR THIS TEST   Cortisol, 30 Min 26.6 ug/dL   Cortisol, 60 Min 27.1 ug/dL    Comment: Performed at [facility], [place] [address] [place], [place] [address]  Glucose, capillary     Status: Abnormal   Collection Time: 08/22/19  5:39 AM  Result Value Ref Range   Glucose-Capillary 124 (H) 70 - 99 mg/dL  Glucose, capillary     Status: Abnormal   Collection Time: 08/22/19  8:55 AM  Result Value Ref Range   Glucose-Capillary 132 (H) 70 - 99 mg/dL  Renal function panel     Status: Abnormal   Collection Time: 08/22/19  9:46 AM  Result Value Ref Range   Sodium 140 135 - 145 mmol/L   Potassium 4.6 3.5 - 5.1 mmol/L    Chloride 99 98 - 111 mmol/L   CO2 18 (L) 22 - 32 mmol/L   Glucose, Bld 153 (H) 70 - 99 mg/dL   BUN 64 (H) 6 - 20 mg/dL   Creatinine, Ser 5.19 (H) 0.61 - 1.24 mg/dL   Calcium 7.7 (L) 8.9 - 10.3 mg/dL   Phosphorus 4.7 (H) 2.5 - 4.6 mg/dL   Albumin 2.1 (L) 3.5 - 5.0 g/dL   GFR calc non Af Amer 12 (L) >60 mL/min   GFR calc Af Amer 13 (L) >60 mL/min   Anion gap 23 (H) 5 - 15    Comment: CONSISTENT WITH PREVIOUS RESULT Performed at [facility] [address], [place], [place] [address]   CBC     Status: Abnormal   Collection Time: 08/22/19  9:46 AM  Result Value Ref Range   WBC 16.6 (H) 4.0 - 10.5 K/uL   RBC 2.85 (L) 4.22 - 5.81 MIL/uL   Hemoglobin 7.8 (L) 13.0 - 17.0 g/dL   HCT 26.1 (L) 39.0 - 52.0 %   MCV 91.6 80.0 - 100.0 fL   MCH 27.4 26.0 - 34.0 pg   MCHC 29.9 (L) 30.0 - 36.0 g/dL   RDW 20.6 (H) 11.5 - 15.5 %   Platelets 260 150 - 400 K/uL   nRBC 0.1 0.0 - 0.2 %    Comment: Performed at [facility] [address], [place], [place] [address]  Glucose, capillary     Status: Abnormal   Collection Time: 08/22/19 12:04 PM  Result Value Ref Range   Glucose-Capillary 152 (H) 70 - 99 mg/dL  Glucose, capillary     Status: Abnormal   Collection Time: 08/22/19  3:41 PM  Result Value Ref Range   Glucose-Capillary 114 (H) 70 - 99 mg/dL  Glucose, capillary     Status: Abnormal   Collection Time: 08/22/19  7:33 PM  Result Value Ref Range   Glucose-Capillary 175 (H) 70 - 99 mg/dL  Glucose, capillary     Status: Abnormal   Collection Time: 08/22/19 10:47 PM  Result Value Ref Range   Glucose-Capillary 149 (H) 70 - 99 mg/dL  Glucose, random     Status: Abnormal   Collection Time: 08/22/19 11:00 PM  Result Value Ref Range   Glucose, Bld 150 (H) 70 - 99 mg/dL    Comment: Performed at [facility], [place] [address], [place], [place] [address]  Glucose, capillary     Status: Abnormal   Collection Time: 08/22/19 11:40 PM  Result Value Ref Range   Glucose-Capillary 164  (H) 70 - 99  mg/dL  Glucose, capillary     Status: Abnormal   Collection Time: 08/23/19  1:44 AM  Result Value Ref Range   Glucose-Capillary 140 (H) 70 - 99 mg/dL  CBC     Status: Abnormal   Collection Time: 08/23/19  2:02 AM  Result Value Ref Range   WBC 14.8 (H) 4.0 - 10.5 K/uL   RBC 2.77 (L) 4.22 - 5.81 MIL/uL   Hemoglobin 7.5 (L) 13.0 - 17.0 g/dL   HCT 25.6 (L) 39.0 - 52.0 %   MCV 92.4 80.0 - 100.0 fL   MCH 27.1 26.0 - 34.0 pg   MCHC 29.3 (L) 30.0 - 36.0 g/dL   RDW 20.2 (H) 11.5 - 15.5 %   Platelets 261 150 - 400 K/uL   nRBC 0.0 0.0 - 0.2 %    Comment: Performed at [facility], [address], [place], [place] [address]  Renal function panel     Status: Abnormal   Collection Time: 08/23/19  2:02 AM  Result Value Ref Range   Sodium 136 135 - 145 mmol/L   Potassium 4.5 3.5 - 5.1 mmol/L   Chloride 93 (L) 98 - 111 mmol/L   CO2 20 (L) 22 - 32 mmol/L   Glucose, Bld 139 (H) 70 - 99 mg/dL   BUN 75 (H) 6 - 20 mg/dL   Creatinine, Ser 5.92 (H) 0.61 - 1.24 mg/dL   Calcium 7.6 (L) 8.9 - 10.3 mg/dL   Phosphorus 4.8 (H) 2.5 - 4.6 mg/dL   Albumin 2.0 (L) 3.5 - 5.0 g/dL   GFR calc non Af Amer 10 (L) >60 mL/min   GFR calc Af Amer 11 (L) >60 mL/min   Anion gap 23 (H) 5 - 15    Comment: Performed at [facility], [address], [place], [place] [address]  Glucose, capillary     Status: Abnormal   Collection Time: 08/23/19  3:43 AM  Result Value Ref Range   Glucose-Capillary 110 (H) 70 - 99 mg/dL  Glucose, capillary     Status: None   Collection Time: 08/23/19  8:05 AM  Result Value Ref Range   Glucose-Capillary 71 70 - 99 mg/dL  Glucose, capillary     Status: Abnormal   Collection Time: 08/23/19 11:34 AM  Result Value Ref Range   Glucose-Capillary 39 (LL) 70 - 99 mg/dL   Comment 1 Repeat Test    Comment 2 Call MD NNP PA CNM   Glucose, capillary     Status: None   Collection Time: 08/23/19 11:59 AM  Result Value Ref Range   Glucose-Capillary 70 70 - 99 mg/dL    Glucose, random     Status: Abnormal   Collection Time: 08/23/19  1:49 PM  Result Value Ref Range   Glucose, Bld 174 (H) 70 - 99 mg/dL    Comment: Performed at [facility], [place] [address], [place], [place] [address]  Glucose, capillary     Status: Abnormal   Collection Time: 08/23/19  2:43 PM  Result Value Ref Range   Glucose-Capillary 142 (H) 70 - 99 mg/dL    CT FEMUR LEFT W CONTRAST  Result Date: 08/22/2019 CLINICAL DATA:  Calciphylaxis and foul-smelling leg wound. EXAM: CT OF THE LOWER RIGHT EXTREMITY WITH CONTRAST TECHNIQUE: Multidetector CT imaging of the lower right extremity was performed according to the standard protocol following intravenous contrast administration. COMPARISON:  None. CONTRAST:  156mL OMNIPAQUE IOHEXOL 300 MG/ML  SOLN FINDINGS: Bones/Joint/Cartilage Osteopenia and  coarsening of trabecula with subchondral sacroiliac erosion, attributed to renal osteodystrophy. No evidence of osteomyelitis. 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 Ligaments Suboptimally assessed by CT. Muscles and Tendons Broad skin defect along the anterolateral left thigh without undermining or collection. A knee joint effusion is present without any articular erosions or significant size to implicate septic effusion. Soft tissues Bilateral subcutaneous reticulation, likely from volume overload. There could be an element of cellulitis which would be better detected on exam. No evidence of fasciitis. No soft tissue emphysema. Heavily calcified vessels especially of the superficial femoral artery which is not visibly enhancing. IMPRESSION: 1. Negative for abscess or osteomyelitis. 2. Renal osteodystrophy. Electronically Signed   By: Monte Fantasia M.D.   On: 08/22/2019 14:22    Review of Systems  Constitutional: Negative for fever.  Musculoskeletal:       Denies leg pain;   Skin: Negative for itching.       Left BKA; darkened skin of left leg; hardened lateral left thigh;  foul smelling, skin easily coming off.    Blood pressure 112/67, pulse (!) 118, temperature (!) 97.4 F (36.3 C), temperature source Oral, resp. rate 19, height 5\' 9"  (1.753 m), weight 93.3 kg, SpO2 100 %. Physical Exam  Vitals reviewed. Constitutional: He appears well-developed. No distress.  HENT:  Head: Normocephalic and atraumatic.  Eyes: EOM are normal.  Respiratory: Effort normal.  Musculoskeletal:     Cervical back: Normal range of motion.       Legs:     Comments: Left leg: BKA; limb is cold and darkened below line of demarcation; A few hard wounds around knee. False positive when testing sensation below knee. No purulent discharge, no bleeding.  Right leg: Multiple hardened wounds scattered over leg. No drainage, no bleeding. Mild swelling noted.    Assessment/Plan: [name] has a prior left BKA, well healed. History of calciphylaxis with multiple hard ulcers present on both lower extremities, large area on lateral left thigh. Left extremity is cold and skin is darkened just below upper thigh. Skin on left leg comes off easily without force or bleeding when any dressing is removed. Suspect loss of sensation of lower left leg due to false positive test result. Concern is if adequate perfusion is present. No purulent drainage.  Discussed case with Dr. Ouida Sills who was present during evaluation.  Will discuss case with plastics team. We appreciate the opportunity to consult on [name].  Pictures were obtained of the patient by Dr. Ouida Sills and placed in the chart with the patient's or guardian's permission.  Threasa Heads, PA-C 08/23/2019, 7:40 PM

## 2019-08-24 ENCOUNTER — Inpatient Hospital Stay (HOSPITAL_COMMUNITY): Payer: Medicare Other

## 2019-08-24 DIAGNOSIS — R609 Edema, unspecified: Secondary | ICD-10-CM

## 2019-08-24 LAB — CBC
HCT: 23.8 % — ABNORMAL LOW (ref 39.0–52.0)
Hemoglobin: 7.3 g/dL — ABNORMAL LOW (ref 13.0–17.0)
MCH: 27.5 pg (ref 26.0–34.0)
MCHC: 30.7 g/dL (ref 30.0–36.0)
MCV: 89.8 fL (ref 80.0–100.0)
Platelets: 292 10*3/uL (ref 150–400)
RBC: 2.65 MIL/uL — ABNORMAL LOW (ref 4.22–5.81)
RDW: 19.9 % — ABNORMAL HIGH (ref 11.5–15.5)
WBC: 16 10*3/uL — ABNORMAL HIGH (ref 4.0–10.5)
nRBC: 0 % (ref 0.0–0.2)

## 2019-08-24 LAB — COMPREHENSIVE METABOLIC PANEL
ALT: 20 U/L (ref 0–44)
AST: 15 U/L (ref 15–41)
Albumin: 1.9 g/dL — ABNORMAL LOW (ref 3.5–5.0)
Alkaline Phosphatase: 127 U/L — ABNORMAL HIGH (ref 38–126)
Anion gap: 26 — ABNORMAL HIGH (ref 5–15)
BUN: 78 mg/dL — ABNORMAL HIGH (ref 6–20)
CO2: 18 mmol/L — ABNORMAL LOW (ref 22–32)
Calcium: 8 mg/dL — ABNORMAL LOW (ref 8.9–10.3)
Chloride: 95 mmol/L — ABNORMAL LOW (ref 98–111)
Creatinine, Ser: 5.96 mg/dL — ABNORMAL HIGH (ref 0.61–1.24)
GFR calc Af Amer: 11 mL/min — ABNORMAL LOW (ref >60)
GFR calc non Af Amer: 10 mL/min — ABNORMAL LOW (ref >60)
Glucose, Bld: 154 mg/dL — ABNORMAL HIGH (ref 70–99)
Potassium: 4.6 mmol/L (ref 3.5–5.1)
Sodium: 139 mmol/L (ref 135–145)
Total Bilirubin: 0.6 mg/dL (ref 0.3–1.2)
Total Protein: 5.9 g/dL — ABNORMAL LOW (ref 6.5–8.1)

## 2019-08-24 LAB — GLUCOSE, CAPILLARY
Glucose-Capillary: 102 mg/dL — ABNORMAL HIGH (ref 70–99)
Glucose-Capillary: 120 mg/dL — ABNORMAL HIGH (ref 70–99)
Glucose-Capillary: 125 mg/dL — ABNORMAL HIGH (ref 70–99)
Glucose-Capillary: 125 mg/dL — ABNORMAL HIGH (ref 70–99)
Glucose-Capillary: 132 mg/dL — ABNORMAL HIGH (ref 70–99)
Glucose-Capillary: 173 mg/dL — ABNORMAL HIGH (ref 70–99)
Glucose-Capillary: 174 mg/dL — ABNORMAL HIGH (ref 70–99)

## 2019-08-24 LAB — MAGNESIUM: Magnesium: 2.3 mg/dL (ref 1.7–2.4)

## 2019-08-24 LAB — GLUCOSE, RANDOM
Glucose, Bld: 132 mg/dL — ABNORMAL HIGH (ref 70–99)
Glucose, Bld: 129 mg/dL — ABNORMAL HIGH (ref 70–99)

## 2019-08-24 MED ORDER — HEPARIN BOLUS VIA INFUSION
3000.0000 [IU] | Freq: Once | INTRAVENOUS | Status: AC
  Administered 2019-08-24: 3000 [IU] via INTRAVENOUS
  Filled 2019-08-24: qty 3000

## 2019-08-24 MED ORDER — CHLORHEXIDINE GLUCONATE CLOTH 2 % EX PADS
6.0000 | MEDICATED_PAD | Freq: Every day | CUTANEOUS | Status: DC
  Administered 2019-08-25 – 2019-08-26 (×2): 6 via TOPICAL

## 2019-08-24 MED ORDER — HEPARIN (PORCINE) 25000 UT/250ML-% IV SOLN
1900.0000 [IU]/h | INTRAVENOUS | Status: DC
  Administered 2019-08-24: 1250 [IU]/h via INTRAVENOUS
  Administered 2019-08-25: 06:00:00 1550 [IU]/h via INTRAVENOUS
  Filled 2019-08-24 (×2): qty 250

## 2019-08-24 MED ORDER — ACETAMINOPHEN 325 MG PO TABS
650.0000 mg | ORAL_TABLET | ORAL | Status: DC | PRN
  Administered 2019-08-24: 650 mg via ORAL
  Filled 2019-08-24: qty 2

## 2019-08-24 NOTE — Progress Notes (Signed)
ANTICOAGULATION CONSULT NOTE - Initial Consult  Pharmacy Consult for heparin Indication: DVT  Allergies  Allergen Reactions  . Amlodipine Itching  . Dacarbazine And Related Nausea And Vomiting  . Fentanyl Itching  . Hydrocodone Nausea And Vomiting  . Propofol Itching    Patient Measurements: Height: 5\' 9"  (175.3 cm) Weight: 205 lb 11 oz (93.3 kg) IBW/kg (Calculated) : 70.7 Heparin Dosing Weight: 88kg  Vital Signs: Temp: 99 F (37.2 C) (02/09 1121) Temp Source: Oral (02/09 1121) BP: 120/50 (02/09 1200) Pulse Rate: 97 (02/09 0400)  Labs: Recent Labs    08/22/19 0946 08/22/19 0946 08/23/19 0202 08/24/19 0240  HGB 7.8*   < > 7.5* 7.3*  HCT 26.1*  --  25.6* 23.8*  PLT 260  --  261 292  CREATININE 5.19*  --  5.92* 5.96*   < > = values in this interval not displayed.    Estimated Creatinine Clearance: 15.8 mL/min (A) (by C-G formula based on SCr of 5.96 mg/dL (H)).   Medical History: Past Medical History:  Diagnosis Date  . Anemia due to chronic kidney disease   . Cardiac arrest ([place]) 08/11/2019  . End-stage renal disease on hemodialysis ([place])   . Personal history of MRSA (methicillin resistant Staphylococcus aureus) 07/2020   pcr positive    Assessment: 54 year old male with newly diagnosed DVT in left femoral vein. Orders to start IV heparin, has not been on dvt px due to overt anemia.   Given low hemoglobin of 7.3 will only give small heparin bolus this afternoon. No overt bleeding noted.   Goal of Therapy:  Heparin level 0.3-0.7 units/ml Monitor platelets by anticoagulation protocol: Yes   Plan:  Give 3000 units bolus x 1 Start heparin infusion at 1250 units/hr Check anti-Xa level in 8 hours and daily while on heparin Continue to monitor H&H and platelets  Erin Hearing PharmD., BCPS Clinical Pharmacist 08/24/2019 1:34 PM

## 2019-08-24 NOTE — Progress Notes (Addendum)
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  Access right femoral TDC in place with dressing c/d/i  [place] M[facility]   R fem TDC   450/800  2/2 bath  86kg  Hep 6K + 1K mid -mircera 200 mcg every 2 weeks last on 1/16 -Sodium thiosulfate 25 gram every treatment  -Weekly venofer -parsabiv 2.5 mg three times a week    CXR 1/28 - no active disease  Assessment/Plan:  # Cardiac arrest/ found down - Supportive care per pulm critical care--> improved  # Acute hypoxic respiratory failure - extubated to [place] now  # End-stage renal disease - Normally reported as MWF schedule  - CRRT started 1/28 and off 2/3 PM - iHD 2/5 - tolerated OK despite quite low BP on cuff.  Despite midodrine 10 TID he is still requiring NE gtt at low dose.  We are concerned that this hypotension may be an ongoing issue and limit his ability to receive ongoing dialytic therapies as an outpt. Pt is up 7kg and edematous.  Cortisol ok.   - HD tomorrow, 3.5h  # Shock - likely septic, MRSA growing resp cx, bcx's negative on IV abx - Improved but still on low dose pressor support + midodrine 10 TID; per above. -Completed course of vanc 2/5  # Anemia acute on chronic - Status post PRBCs - last mircera was on 1/30 at 150 mcg -Hb 7-8s now and intermittent transfusions have been  required..   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  Kelly Splinter, MD 08/24/2019, 11:38 AM        Inpatient medications: . Chlorhexidine Gluconate Cloth  6 each Topical Daily  . Chlorhexidine Gluconate Cloth  6 each Topical Q0600  . cinacalcet  30 mg Oral Q breakfast  . darbepoetin (ARANESP) injection - DIALYSIS  150 mcg Intravenous Q Sat-HD  . DULoxetine  20 mg Oral Daily  . feeding supplement (NEPRO CARB STEADY)  237 mL Oral TID BM  . feeding supplement (PRO-STAT SUGAR FREE 64)  30 mL Oral BID  . gabapentin  200 mg Oral QHS  . insulin aspart  0-15 Units Subcutaneous Q6H  . insulin detemir  8 Units Subcutaneous BID  . midodrine  10 mg Oral TID WC  . multivitamin  1 tablet Oral QHS  .  sodium chloride flush  10-40 mL Intracatheter Q12H   . sodium chloride 10 mL/hr at 08/22/19 2300  . sodium chloride    . ceFEPime (MAXIPIME) IV Stopped (08/23/19 1848)  . norepinephrine (LEVOPHED) Adult infusion 8 mcg/min (08/24/19 1100)  . sodium thiosulfate infusion for calciphylaxis 25 g (08/23/19 1114)  . vancomycin 750 mg (08/23/19 1124)   sodium chloride, sodium chloride, acetaminophen, albuterol, bisacodyl, heparin, HYDROmorphone, hydrOXYzine, influenza vac split quadrivalent PF, lidocaine (PF), lidocaine (PF), lidocaine-prilocaine, pentafluoroprop-tetrafluoroeth, pneumococcal 23 valent vaccine, senna-docusate, sodium chloride flush Recent Labs  Lab 08/22/19 0946 08/22/19 2300 08/23/19 0202 08/23/19 1349 08/23/19 2300 08/24/19 0240  NA 140  --  136  --   --  139  K 4.6  --  4.5  --   --  4.6  CL 99  --  93*  --   --   95*  CO2 18*  --  20*  --   --  18*  GLUCOSE 153*   < > 139*   < > 125* 154*  BUN 64*  --  75*  --   --  78*  CREATININE 5.19*  --  5.92*  --   --  5.96*  CALCIUM 7.7*  --  7.6*  --   --  8.0*  PHOS 4.7*  --  4.8*  --   --   --    < > = values in this interval not displayed.

## 2019-08-24 NOTE — Progress Notes (Signed)
NAME:  [name], MRN:  [id number], DOB:  April 03, 1966, LOS: 14 ADMISSION DATE:  08/05/2019, CONSULTATION DATE: 07/29/2019 REFERRING MD: Dr. Roxanne Mins, CHIEF COMPLAINT: Cardiac arrest  Brief History   54 year old male who is status post cardiac arrest and septic shock secondary to MRSA pneumonia.  History of present illness   Patient was found unresponsive 07/27/2019 and had witnessed cardiac arrest, obtained ROSC after 3 minutes.  Patient required intubation for ongoing airway protection and unresponsiveness. Required vasopressor support for hypotension.  Additionally found to have MRSA pneumonia, currently being treated with vancomycin.  Patient extubated on 08/18/2019.  Patient continues to require pressors for BP and HR despite completed treatment of MRSA Pneumonia. Concern is growing for new source of infection or inflammation, including left thigh calciphylaxis, penile gangrene, or other pathology of right lower leg.    Past Medical History   Past Medical History:  Diagnosis Date  . Anemia due to chronic kidney disease   . Cardiac arrest ([place]) 07/16/2019  . End-stage renal disease on hemodialysis ([place])   . Personal history of MRSA (methicillin resistant Staphylococcus aureus) 07/2020   pcr positive    Significant Hospital Events   1/28 Admitted. On levophed and added vasopressin  1/29: O/N Hyperkalemic, CRRT for correction also GOC discussion with family, made DNR. Still on levophed and multiple analgesics/sedatives. 2/03: Extubated, off pressors 2/04: Attempted bedside central line placement, IR consulted and placed L External Jugular line. 2/07: ACTH stimulation test performed, results normal  Consults:  Nephrology Orthopedics Plastics  Procedures:  2/4 - central line placement by IR  Significant Diagnostic Tests:  DG Chest 1 View  Result Date: 08/19/2019 CLINICAL DATA:  Central line placement EXAM: CHEST  1 VIEW COMPARISON:  08/17/2019 FINDINGS: Frontal view of the  chest demonstrates stable cardiac silhouette. Vascular stent right subclavian distribution unchanged. Central venous catheter from an inferior approach tip overlies the junction of the inferior vena cava and right atrium. I do not see any new central venous catheter is on this exam. Stable bibasilar scarring. No airspace disease, effusion, or pneumothorax. IMPRESSION: 1. Stable central venous catheter from an inferior approach. 2. No other central lines are identified on this exam. Electronically Signed   By: Randa Ngo M.D.   On: 08/19/2019 14:14   CT Head Wo Contrast  Result Date: 07/24/2019 CLINICAL DATA:  Cerebral hemorrhage suspected, unresponsive, found by friend post witnessed arrest EXAM: CT HEAD WITHOUT CONTRAST TECHNIQUE: Contiguous axial images were obtained from the base of the skull through the vertex without intravenous contrast. COMPARISON:  None. FINDINGS: Brain: No evidence of acute infarction, hemorrhage, hydrocephalus, extra-axial collection or mass lesion/mass effect. Symmetric prominence of the ventricles, cisterns and sulci compatible with parenchymal volume loss. Patchy areas of white matter hypoattenuation are most compatible with chronic microvascular angiopathy. Extensive dural calcifications are noted along the falx and tentorium. Vascular: Extensive calcification of the carotid siphons and vertebral arteries. No unexpected hyperdense vessel is evident. Skull: No calvarial fracture or suspicious osseous lesion. No scalp swelling or hematoma. Diffuse scalp edema is seen. Benign scattered scalp calcifications. Sinuses/Orbits: Patient is intubated at the time of exam with tract transesophageal and endotracheal tubes extending below the level of imaging. Secretions in the ethmoids and posterior nasopharynx are likely related to this intubation. Minimal mucosal thickening in the maxillary sinuses. Prior lens extractions are noted. Mildly proptotic configuration of the globes which is  symmetric bilaterally. No abnormal thickening of the extraocular musculature or abnormality of the optic nerve should complexes  or superior ophthalmic veins. Other: None IMPRESSION: 1. No CT evidence of acute intracranial process. 2. Chronic microvascular ischemic disease and parenchymal volume loss. 3. Diffuse soft tissue edema.  Correlate with hydration status. 4. Extensive intracranial atherosclerosis. 5. Secretions in the ethmoids and posterior nasopharynx are likely to intubation. 6. Mildly proptotic configuration of the globes which is symmetric bilaterally. Electronically Signed   By: Lovena Le M.D.   On: 07/19/2019 02:43   CT FEMUR LEFT W CONTRAST  Result Date: 08/22/2019 CLINICAL DATA:  Calciphylaxis and foul-smelling leg wound. EXAM: CT OF THE LOWER RIGHT EXTREMITY WITH CONTRAST TECHNIQUE: Multidetector CT imaging of the lower right extremity was performed according to the standard protocol following intravenous contrast administration. COMPARISON:  None. 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 No evidence of osteomyelitis. 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 Ligaments Suboptimally assessed by CT. Muscles and Tendons Broad skin defect along the anterolateral left thigh without undermining or collection. A knee joint effusion is present without any articular erosions or significant size to implicate septic effusion. Soft tissues Bilateral subcutaneous reticulation, likely from volume overload. There could be an element of cellulitis which would be better detected on exam. No evidence of fasciitis. No soft tissue emphysema. Heavily calcified vessels especially of the superficial femoral artery which is not visibly enhancing. IMPRESSION: 1. Negative for abscess or osteomyelitis. 2. Renal osteodystrophy. Electronically  Signed   By: Monte Fantasia M.D.   On: 08/22/2019 14:22   IR Fluoro Guide CV Line Left  Result Date: 08/19/2019 INDICATION: 54 year old with poor venous access, end-stage renal disease and hemodialysis. Recent cardiac arrest. EXAM: FLUOROSCOPIC AND ULTRASOUND GUIDED PLACEMENT OF A NON TUNNELED VENOUS CATHETER Physician: Stephan Minister. Henn, MD MEDICATIONS: None ANESTHESIA/SEDATION: None FLUOROSCOPY TIME:  Fluoroscopy Time: 1 minutes and 18 seconds COMPLICATIONS: None immediate. PROCEDURE: Informed consent was obtained for catheter placement. The patient was placed supine on the interventional table. Ultrasound confirmed a patent left external jugular vein. Ultrasound images were obtained for documentation. The left neck was prepped and draped in a sterile fashion. The left neck was anesthetized with 1% lidocaine. Maximal barrier sterile technique was utilized including caps, mask, sterile gowns, sterile gloves, sterile drape, hand hygiene and skin antiseptic. A 21 gauge needle directed into the left external jugular vein with ultrasound guidance. A micropuncture dilator set was placed. Central venogram was performed. Dual lumen Power PICC line was selected. Catheter was cut to 8 cm. A peel-away sheath was placed over a 0.018 wire. The Power PICC was advanced through the peel-away sheath and positioned in the left innominate vein. Both lumens aspirated and flushed well. Both lumens were flushed with saline, capped and clamped. Catheter was sutured to skin. Dressing was placed over the catheter. Fluoroscopic and ultrasound images were taken and saved for documentation. FINDINGS: Patent left external jugular vein. Central venogram demonstrates obstruction of the central left innominate vein with extensive collateral flow into the SVC. Tip of the venous catheter was placed in what appears to be the left innominate vein. IMPRESSION: Placement of a left external jugular venous catheter. Catheter tip in the region of the left  innominate vein. 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 Electronically Signed   By: Markus Daft M.D.   On: 08/19/2019 18:20   IR US Guide Vasc Access Left  Result Date: 08/19/2019 INDICATION: 54 year old with poor venous access, end-stage renal  disease and hemodialysis. Recent cardiac arrest. EXAM: FLUOROSCOPIC AND ULTRASOUND GUIDED PLACEMENT OF A NON TUNNELED VENOUS CATHETER Physician: Stephan Minister. Henn, MD MEDICATIONS: None ANESTHESIA/SEDATION: None FLUOROSCOPY TIME:  Fluoroscopy Time: 1 minutes and 18 seconds COMPLICATIONS: None immediate. PROCEDURE: Informed consent was obtained for catheter placement. The patient was placed supine on the interventional table. Ultrasound confirmed a patent left external jugular vein. Ultrasound images were obtained for documentation. The left neck was prepped and draped in a sterile fashion. The left neck was anesthetized with 1% lidocaine. Maximal barrier sterile technique was utilized including caps, mask, sterile gowns, sterile gloves, sterile drape, hand hygiene and skin antiseptic. A 21 gauge needle directed into the left external jugular vein with ultrasound guidance. A micropuncture dilator set was placed. Central venogram was performed. Dual lumen Power PICC line was selected. Catheter was cut to 8 cm. A peel-away sheath was placed over a 0.018 wire. The Power PICC was advanced through the peel-away sheath and positioned in the left innominate vein. Both lumens aspirated and flushed well. Both lumens were flushed with saline, capped and clamped. Catheter was sutured to skin. Dressing was placed over the catheter. Fluoroscopic and ultrasound images were taken and saved for documentation. FINDINGS: Patent left external jugular vein. Central venogram demonstrates obstruction of the central left innominate vein with extensive collateral flow into the SVC. Tip of the venous catheter was placed in what appears to be the left innominate vein. IMPRESSION: Placement  of a left external jugular venous catheter. Catheter tip in the region of the left innominate vein. 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 Electronically Signed   By: Markus Daft M.D.   On: 08/19/2019 18:20   DG Chest Port 1 View  Result Date: 08/17/2019 CLINICAL DATA:  Recent cardiac arrest, check endotracheal tube placement EXAM: PORTABLE CHEST 1 VIEW COMPARISON:  08/15/2018 FINDINGS: Endotracheal tube and gastric catheter are noted in satisfactory position. Femoral dialysis catheter is noted in satisfactory position. Mild atelectatic changes are noted in the left base. No effusion is seen. The right subclavian venous stent and brachial venous stent are noted. IMPRESSION: Mild left basilar atelectasis. Tubes and lines as described Electronically Signed   By: Inez Catalina M.D.   On: 08/17/2019 02:21   DG Chest Port 1 View  Result Date: 08/16/2019 CLINICAL DATA:  Respiratory failure. EXAM: PORTABLE CHEST 1 VIEW COMPARISON:  02/20/2018 FINDINGS: The endotracheal tube is 5.5 cm above the carina. The NG tube is coursing down the esophagus and into the stomach. A central venous catheter is noted in the right atrium coming up from the IVC. Vascular stents are noted. Aortic calcifications are stable. The heart is normal in size. Streaky left basilar atelectasis but overall improved lung aeration since prior study. IMPRESSION: 1. Stable support apparatus. 2. Improved lung aeration with residual streaky left basilar atelectasis. Electronically Signed   By: Marijo Sanes M.D.   On: 08/16/2019 07:07   DG CHEST PORT 1 VIEW  Result Date: 08/05/2019 CLINICAL DATA:  Endotracheal tube placement. EXAM: PORTABLE CHEST 1 VIEW COMPARISON:  Earlier same day FINDINGS: Endotracheal tube tip 5.5 cm from the carina. Orogastric tube enters the abdomen. Femoral venous catheter tip in the right atrium. Slight worsening of density in the mid and lower lungs left more than right could be edema, pneumonia or atelectasis.  Upper lungs remain clear. IMPRESSION: Endotracheal tube tip 5.5 cm above the carina. Worsened bilateral lower lung density left more than right could be edema, pneumonia or atelectasis. Electronically  Signed   By: Nelson Chimes M.D.   On: 08/14/2019 14:32   DG Chest Port 1 View  Result Date: 07/26/2019 CLINICAL DATA:  Post intubation EXAM: PORTABLE CHEST 1 VIEW COMPARISON:  Radiograph February 20, 2018 FINDINGS: Endotracheal tube terminates in the mid trachea, 5 cm from the carina. Transesophageal tube tip and side port distal to the GE junction, terminating below the level of imaging. Telemetry leads and pacer pads overlie the chest. Some bandlike opacity in the left mid lung likely reflect subsegmental atelectasis. No consolidation, features of edema, pneumothorax, or effusion. The cardiomediastinal contours are unremarkable. No acute osseous or soft tissue abnormality. IMPRESSION: Lines and tubes as above. Subsegmental atelectasis in the left mid lung. No other acute cardiopulmonary abnormality. Electronically Signed   By: Lovena Le M.D.   On: 08/02/2019 01:59   DG Abd Portable 1V  Result Date: 08/18/2019 CLINICAL DATA:  OG tube placement EXAM: PORTABLE ABDOMEN - 1 VIEW COMPARISON:  08/16/2019 FINDINGS: The tip of the OG tube projects over the gastric body. There is a femoral approach dialysis catheter that appears well position. The stomach is distended. The bowel gas pattern is otherwise nonspecific. IMPRESSION: Enteric tube terminates over the stomach. Electronically Signed   By: Constance Holster M.D.   On: 08/18/2019 00:21   DG Abd Portable 1V  Result Date: 08/16/2019 CLINICAL DATA:  Enteric tube placement EXAM: PORTABLE ABDOMEN - 1 VIEW COMPARISON:  None. FINDINGS: Enteric tube terminates within the body of the stomach. Femoral approach central line tip overlies the right atrium. Visualized bowel gas pattern is unremarkable. IMPRESSION: Enteric tube terminates within the stomach. Electronically  Signed   By: Macy Mis M.D.   On: 08/16/2019 16:34   ECHOCARDIOGRAM COMPLETE  Result Date: 08/13/2019   ECHOCARDIOGRAM REPORT   Patient Name:   TAREEK THELEN Date of Exam: 08/13/2019 Medical Rec #:  [id number]         Height:       69.0 in Accession #:    [id number]        Weight:       193.8 lb Date of Birth:  27-Feb-1966         BSA:          2.04 m Patient Age:    89 years          BP:           92/66 mmHg Patient Gender: M                 HR:           97 bpm. Exam Location:  Inpatient Procedure: 2D Echo, Cardiac Doppler and Color Doppler Indications:    Cardiac Arrest 427.5  History:        Patient has no prior history of Echocardiogram examinations.  Sonographer:    Jonelle Sidle Dance Referring Phys: [id number] PAULA B SIMPSON  Sonographer Comments: Echo performed with patient supine and on artificial respirator. Image acquisition challenging due to respiratory motion. IMPRESSIONS  1. Left ventricular ejection fraction, by visual estimation, is 30 to 35%. The left ventricle has moderately decreased function. There is mildly increased left ventricular hypertrophy.  2. Left ventricular diastolic parameters are consistent with Grade I diastolic dysfunction (impaired relaxation).  3. The left ventricle demonstrates global hypokinesis.  4. Global right ventricle has mildly reduced systolic function.The right ventricular size is normal. No increase in right ventricular wall thickness.  5. Left atrial size was normal.  6. Right  atrial size was normal.  7. The pericardial effusion is circumferential.  8. Trivial pericardial effusion is present.  9. The mitral valve is normal in structure. No evidence of mitral valve regurgitation. 10. The tricuspid valve is normal in structure. 11. The tricuspid valve is normal in structure. Tricuspid valve regurgitation is trivial. 12. The aortic valve is normal in structure. Aortic valve regurgitation is not visualized. 13. The pulmonic valve was grossly normal. Pulmonic valve  regurgitation is not visualized. 14. Normal pulmonary artery systolic pressure. 15. The tricuspid regurgitant velocity is 2.32 m/s, and with an assumed right atrial pressure of 3 mmHg, the estimated right ventricular systolic pressure is normal at 24.5 mmHg. 16. The inferior vena cava is normal in size with greater than 50% respiratory variability, suggesting right atrial pressure of 3 mmHg. 17. No significant change from prior study. 18. Prior images reviewed side by side. FINDINGS  Left Ventricle: Left ventricular ejection fraction, by visual estimation, is 30 to 35%. The left ventricle has moderately decreased function. The left ventricle demonstrates global hypokinesis. The left ventricular internal cavity size was the left ventricle is normal in size. There is mildly increased left ventricular hypertrophy. Concentric left ventricular hypertrophy. Left ventricular diastolic parameters are consistent with Grade I diastolic dysfunction (impaired relaxation). Right Ventricle: The right ventricular size is normal. No increase in right ventricular wall thickness. Global RV systolic function is has mildly reduced systolic function. The tricuspid regurgitant velocity is 2.32 m/s, and with an assumed right atrial pressure of 3 mmHg, the estimated right ventricular systolic pressure is normal at 24.5 mmHg. Left Atrium: Left atrial size was normal in size. Right Atrium: Right atrial size was normal in size Pericardium: Trivial pericardial effusion is present. The pericardial effusion is circumferential. Mitral Valve: The mitral valve is normal in structure. No evidence of mitral valve regurgitation. Tricuspid Valve: The tricuspid valve is normal in structure. Tricuspid valve regurgitation is trivial. Aortic Valve: The aortic valve is normal in structure. Aortic valve regurgitation is not visualized. Pulmonic Valve: The pulmonic valve was grossly normal. Pulmonic valve regurgitation is not visualized. Pulmonic  regurgitation is not visualized. Aorta: The aortic root and ascending aorta are structurally normal, with no evidence of dilitation. Venous: The inferior vena cava is normal in size with greater than 50% respiratory variability, suggesting right atrial pressure of 3 mmHg. IAS/Shunts: No atrial level shunt detected by color flow Doppler. Additional Comments: Little change from 06/08/2019 (note different MRN).  LEFT VENTRICLE PLAX 2D LVIDd:         4.20 cm  Diastology LVIDs:         3.90 cm  LV e' lateral:   5.78 cm/s LV PW:         1.30 cm  LV E/e' lateral: 7.5 LV IVS:        1.20 cm  LV e' medial:    5.78 cm/s LVOT diam:     2.10 cm  LV E/e' medial:  7.5 LV SV:         13 ml LV SV Index:   6.06 LVOT Area:     3.46 cm  RIGHT VENTRICLE            IVC RV Basal diam:  2.10 cm    IVC diam: 1.40 cm RV S prime:     8.24 cm/s TAPSE (M-mode): 1.6 cm LEFT ATRIUM           Index       RIGHT ATRIUM  Index LA diam:      2.70 cm 1.32 cm/m  RA Area:     9.52 cm LA Vol (A4C): 26.9 ml 13.20 ml/m RA Volume:   18.20 ml 8.93 ml/m  AORTIC VALVE LVOT Vmax:   80.20 cm/s LVOT Vmean:  53.900 cm/s LVOT VTI:    0.126 m  AORTA Ao Root diam: 3.50 cm Ao Asc diam:  2.50 cm MITRAL VALVE                        TRICUSPID VALVE MV Area (PHT): 3.53 cm             TR Peak grad:   21.5 mmHg MV PHT:        62.35 msec           TR Vmax:        232.00 cm/s MV Decel Time: 215 msec MV E velocity: 43.30 cm/s 103 cm/s  SHUNTS MV A velocity: 59.60 cm/s 70.3 cm/s Systemic VTI:  0.13 m MV E/A ratio:  0.73       1.5       Systemic Diam: 2.10 cm  Dani Gobble Croitoru MD Electronically signed by Sanda Klein MD Signature Date/Time: 08/13/2019/2:38:10 PM    Final    [place] EKG SITE RITE  Result Date: 08/18/2019 If Site Rite image not attached, placement could not be confirmed due to current cardiac rhythm.   Micro Data:   Results for orders placed or performed during the hospital encounter of 08/13/2019  Respiratory Panel by RT PCR (Flu A&B, Covid) -  Nasopharyngeal Swab     Status: None   Collection Time: 08/03/2019  1:46 AM   Specimen: Nasopharyngeal Swab  Result Value Ref Range Status   SARS Coronavirus 2 by RT PCR NEGATIVE NEGATIVE Final    Comment: (NOTE) SARS-CoV-2 target nucleic acids are NOT DETECTED. The SARS-CoV-2 RNA is generally detectable in upper respiratoy specimens during the acute phase of infection. The lowest concentration of SARS-CoV-2 viral copies this assay can detect is 131 copies/mL. A negative result does not preclude SARS-Cov-2 infection and should not be used as the sole basis for treatment or other patient management decisions. A negative result may occur with  improper specimen collection/handling, submission of specimen other than nasopharyngeal swab, presence of viral mutation(s) within the areas targeted by this assay, and inadequate number of viral copies (<131 copies/mL). A negative result must be combined with clinical observations, patient history, and epidemiological information. The expected result is Negative. Fact Sheet for Patients:  PinkCheek.be Fact Sheet for Healthcare Providers:  GravelBags.it This test is not yet ap proved or cleared by the [place] FDA and  has been authorized for detection and/or diagnosis of SARS-CoV-2 by FDA under an Emergency Use Authorization (EUA). This EUA will remain  in effect (meaning this test can be used) for the duration of the COVID-19 declaration under Section 564(b)(1) of the Act, 21 U.S.C. section 360bbb-3(b)(1), unless the authorization is terminated or revoked sooner.    Influenza A by PCR NEGATIVE NEGATIVE Final   Influenza B by PCR NEGATIVE NEGATIVE Final    Comment: (NOTE) The Xpert Xpress SARS-CoV-2/FLU/RSV assay is intended as an aid in  the diagnosis of influenza from Nasopharyngeal swab specimens and  should not be used as a sole basis for treatment. Nasal washings and  aspirates  are unacceptable for Xpert Xpress SARS-CoV-2/FLU/RSV  testing. Fact Sheet for Patients: PinkCheek.be Fact Sheet for Healthcare Providers: GravelBags.it This  test is not yet approved or cleared by the [place] and  has been authorized for detection and/or diagnosis of SARS-CoV-2 by  FDA under an Emergency Use Authorization (EUA). This EUA will remain  in effect (meaning this test can be used) for the duration of the  Covid-19 declaration under Section 564(b)(1) of the Act, 21  U.S.C. section 360bbb-3(b)(1), unless the authorization is  terminated or revoked. Performed at [facility], [place] [address], [place], [place] [address]   MRSA PCR Screening     Status: Abnormal   Collection Time: 07/22/2019  7:35 AM   Specimen: Nasal Mucosa; Nasopharyngeal  Result Value Ref Range Status   MRSA by PCR POSITIVE (A) NEGATIVE Final    Comment:        The GeneXpert MRSA Assay (FDA approved for NASAL specimens only), is one component of a comprehensive MRSA colonization surveillance program. It is not intended to diagnose MRSA infection nor to guide or monitor treatment for MRSA infections. RESULT CALLED TO, READ BACK BY AND VERIFIED WITH: Mikal Plane RN 9:40 07/27/2019 ([email or web address]) Performed at [facility], [place] [address], [place], [place] [address]   Culture, respiratory (non-expectorated)     Status: None   Collection Time: 08/11/2019  7:38 AM   Specimen: Tracheal Aspirate; Respiratory  Result Value Ref Range Status   Specimen Description TRACHEAL ASPIRATE  Final   Special Requests NONE  Final   Gram Stain   Final    FEW WBC PRESENT, PREDOMINANTLY PMN MODERATE GRAM POSITIVE COCCI IN PAIRS IN CLUSTERS MODERATE GRAM NEGATIVE RODS RARE GRAM NEGATIVE COCCOBACILLI Performed at [facility], [place] [address] [place], [place] [address]    Culture   Final    MODERATE METHICILLIN RESISTANT STAPHYLOCOCCUS AUREUS   Report  Status 08/15/2019 FINAL  Final   Organism ID, Bacteria METHICILLIN RESISTANT STAPHYLOCOCCUS AUREUS  Final      Susceptibility   Methicillin resistant staphylococcus aureus - MIC*    CIPROFLOXACIN >=8 RESISTANT Resistant     ERYTHROMYCIN >=8 RESISTANT Resistant     GENTAMICIN <=0.5 SENSITIVE Sensitive     OXACILLIN >=4 RESISTANT Resistant     TETRACYCLINE <=1 SENSITIVE Sensitive     VANCOMYCIN 1 SENSITIVE Sensitive     TRIMETH/SULFA <=10 SENSITIVE Sensitive     CLINDAMYCIN <=0.25 SENSITIVE Sensitive     RIFAMPIN <=0.5 SENSITIVE Sensitive     Inducible Clindamycin NEGATIVE Sensitive     * MODERATE METHICILLIN RESISTANT STAPHYLOCOCCUS AUREUS  Culture, blood (routine x 2)     Status: None   Collection Time: 08/02/2019  9:46 AM   Specimen: BLOOD RIGHT ARM  Result Value Ref Range Status   Specimen Description BLOOD RIGHT ARM  Final   Special Requests   Final    AEROBIC BOTTLE ONLY Blood Culture results may not be optimal due to an inadequate volume of blood received in culture bottles Performed at [facility], [address] [place], [place] [address]    Culture NO GROWTH 5 DAYS  Final   Report Status 08/17/2019 FINAL  Final  Culture, blood (routine x 2)     Status: None   Collection Time: 08/02/2019  9:47 AM   Specimen: BLOOD  Result Value Ref Range Status   Specimen Description BLOOD RIGHT ANTECUBITAL  Final   Special Requests   Final    AEROBIC BOTTLE ONLY Blood Culture results may not be optimal due to an inadequate volume of blood received in  culture bottles Performed at [facility], [place] [address] [place], [place] [address]    Culture NO GROWTH 5 DAYS  Final   Report Status 08/17/2019 FINAL  Final    Antimicrobials:   Antibiotics Given (last 72 hours)    Date/Time Action Medication Dose Rate   08/22/19 1811 New Bag/Given   vancomycin (VANCOCIN) IVPB 1000 mg/200 mL premix 1,000 mg 200 mL/hr   08/22/19 2053 New Bag/Given   ceFEPIme (MAXIPIME) 2 g in sodium  chloride 0.9 % 100 mL IVPB 2 g 200 mL/hr   08/23/19 1124 New Bag/Given  [last hour of HD]   vancomycin (VANCOREADY) IVPB 750 mg/150 mL 750 mg 150 mL/hr   08/23/19 1818 New Bag/Given   ceFEPIme (MAXIPIME) 1 g in sodium chloride 0.9 % 100 mL IVPB 1 g 200 mL/hr      Interim history/subjective:  Had some pain overnight, received Dilaudid around 9pm and Tylenol 650 around 7am. Vitals stable ON (patient remains on Levophed about 8 mcg/min), afebrile  Objective   Blood pressure 129/65, pulse 97, temperature 97.8 F (36.6 C), temperature source Oral, resp. rate 16, height 5\' 9"  (1.753 m), weight 93.3 kg, SpO2 100 %.        Intake/Output Summary (Last 24 hours) at 08/24/2019 0707 Last data filed at 08/24/2019 0600 Gross per 24 hour  Intake 5548.24 ml  Output 3000 ml  Net 2548.24 ml   Filed Weights   08/23/19 0500 08/23/19 0700 08/23/19 1204  Weight: 96.2 kg 96.3 kg 93.3 kg    Examination: General: Awake, alert, sleepy.  Less awake than day prior. 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  Right lower extremity with 2+ pitting edema, cutaneous abrasions appreciated to patient's thigh, shin, and calf Neuro: No focal neurological deficits appreciated, cranial nerves II-XII grossly intact   Anterior left thigh showing sharp demarcation of cold vs warm tissue (left side is distal thigh, right side is proximal thigh), Xeroform dressing in place. Calciphylaxis to left lateral thigh appreciated.    Left lateral thigh with calciphylaxis (left side of photo is distal  thigh, right side is proximal).   Medial Aspect of Left Leg stump   Resolved Hospital Problem list   Cardiac arrest Prolonged QTC Acute hypoxic respiratory failure MRSA pneumonia Acute encephalopathy  Assessment & Plan:  Multifactorial Shock, likely cardiogenic with superimposed septic shock, unchanged: Mr. Klingel is clinically improved, more aware and awake, however continues to have persistent vasopressor requirement. Due to bilateral brachial HD fistulae (no longer in use) it is difficult to obtain accurate blood pressure. Patient also has severe left thigh calciphylaxis that has started to emit foul odor, suspicious for deeper soft tissue infection/gangrene, could be driver behind vasopressor requirement. Left thigh has a sharp line of demarcation above which is warm and below which is cold, suspicious for decreased perfusion. CT L Femur (2/7) showed heavily calcified vessels especially of the superficial femoral artery, was negative for abscess or osteomyelitis. Patient afebrile ON, vitals stable on ~8 mcg/min Levophed, WBC acutely increased 14.8 > 16 (2/9). Received Albumin 2/8. - Wean norepinephrine as tolerated - Ordered ABIs (difficult to obtain d/t HD  fistulae in brachia) and Doppler US of bilateral lower extremities. - [place] for input - appreciate recs - Consulted Plastic Surgery/Wound Care - appreciate recs - Remains on Vancomycin and Cefepime (started 2/7-) - follow CBC  Calciphylaxis, chronic, acutely worse:  Patientdenies significant pain from his calciphylaxis. Strong suspicion is secondary to patient's comorbidities that he lacks proper sensation to his lower extremities.  Adequate pain controlwith Dilaudid, gabapentin. -Continue gabapentin200mg  BID - Cymbalta 20 mg daily - Dilaudid 4mg  q6 made as needed (only using 1-2 daily) - Continue Sodium Thiosulfate 25mg  IV qMWF with hemodialysis  ESRDon HD MWF: last had HD M 2/8, was diuresed 3 L. Next  session 2/10 - Nephrology consulted - appreciate recommendations  Anemia of Chronic Disease, chronic, acutely worse:Hgb 7.8> 7.5>7.3 on 2/9. Transfused 2 units RBC 01/28-01/29, transfused 2 more units RBC on 08/17/2019 - follow AM CBC -Transfuse Hgb <7 with RBC. - Aranesp IV 135mcg every Saturday  Type 2Diabetes Mellitus:Blood sugar readings more stable 125-174 over the last 24 hours. No occurrences of hypoglycemia. - mSSI  - Levemir 8 units BID - CBGs q4  Best practice:  Diet: Renal/carb modified with fluid restriction Pain/Anxiety/Delirium protocol (if indicated): Dilaudid 4 mg every 6 as needed, Cymbalta 20 mg daily, gabapentin 200 mg nightly VAP protocol (if indicated): None DVT prophylaxis: Heparin GI prophylaxis: None at this time Glucose control: Levemir 8 units twice daily, moderate sliding scale insulin Mobility: Secondary to pain/BKA is limited Code Status: DNR Family Communication: None Disposition: Transition to floor once stable  Labs   CBC: Recent Labs  Lab 08/21/19 0443 08/22/19 0427 08/22/19 0946 08/23/19 0202 08/24/19 0240  WBC 13.7* 13.1* 16.6* 14.8* 16.0*  HGB 8.0* 7.6* 7.8* 7.5* 7.3*  HCT 26.2* 25.9* 26.1* 25.6* 23.8*  MCV 92.3 92.2 91.6 92.4 89.8  PLT 200 233 260 261 [id number]    Basic Metabolic Panel: Recent Labs  Lab 08/19/19 0500 08/19/19 0500 08/19/19 1806 08/19/19 1806 08/19/19 2228 08/19/19 2228 08/20/19 0345 08/20/19 0730 08/20/19 0911 08/21/19 0443 08/21/19 1125 08/21/19 2230 08/22/19 0427 08/22/19 0946 08/22/19 0946 08/22/19 2300 08/23/19 0202 08/23/19 1349 08/23/19 2300 08/24/19 0232 08/24/19 0240  NA 144   < > 137   < > 139   < >  --  139  --  140  --   --   --  140  --   --  136  --   --   --  139  K 3.9   < > 4.3   < > 4.0   < >  --  4.2  --  4.2  --   --   --  4.6  --   --  4.5  --   --   --  4.6  CL 105   < > 100   < > 102   < >  --  100  --  102  --   --   --  99  --   --  93*  --   --   --  95*  CO2 20*   < >  18*   < > 17*   < >  --  17*  --  19*  --   --   --  18*  --   --  20*  --   --   --  18*  GLUCOSE 56*   < > 168*   < > 160*   < >  --  133*   < >  123*   < >   < >  --  153*   < > 150* 139* 174* 125*  --  154*  BUN 40*   < > 50*   < > 50*   < >  --  57*  --  42*  --   --   --  64*  --   --  75*  --   --   --  78*  CREATININE 2.92*   < > 3.66*   < > 3.77*   < >  --  3.87*  --  3.62*  --   --   --  5.19*  --   --  5.92*  --   --   --  5.96*  CALCIUM 8.0*  7.4*   < > 7.4*   < > 7.5*   < >  --  8.0*  --  7.4*  --   --   --  7.7*  --   --  7.6*  --   --   --  8.0*  MG 2.6*   < >  --   --  2.5*  --  2.4  --   --  2.2  --   --  2.4  --   --   --   --   --   --  2.3  --   PHOS 3.5  --  3.9  --   --   --   --  4.7*  --   --   --   --   --  4.7*  --   --  4.8*  --   --   --   --    < > = values in this interval not displayed.   GFR: Estimated Creatinine Clearance: 15.8 mL/min (A) (by C-G formula based on SCr of 5.96 mg/dL (H)). Recent Labs  Lab 08/22/19 0427 08/22/19 0946 08/23/19 0202 08/24/19 0240  WBC 13.1* 16.6* 14.8* 16.0*    Liver Function Tests: Recent Labs  Lab 08/19/19 1806 08/20/19 0730 08/22/19 0946 08/23/19 0202 08/24/19 0240  AST  --   --   --   --  15  ALT  --   --   --   --  20  ALKPHOS  --   --   --   --  127*  BILITOT  --   --   --   --  0.6  PROT  --   --   --   --  5.9*  ALBUMIN 1.7* 2.0* 2.1* 2.0* 1.9*   No results for input(s): LIPASE, AMYLASE in the last 168 hours. No results for input(s): AMMONIA in the last 168 hours.  ABG    Component Value Date/Time   PHART 7.422 08/16/2019 1520   PCO2ART 33.7 08/16/2019 1520   PO2ART 177.0 (H) 08/16/2019 1520   HCO3 21.9 08/16/2019 1520   TCO2 23 08/16/2019 1520   ACIDBASEDEF 2.0 08/16/2019 1520   O2SAT 100.0 08/16/2019 1520     Coagulation Profile: No results for input(s): INR, PROTIME in the last 168 hours.  Cardiac Enzymes: No results for input(s): CKTOTAL, CKMB, CKMBINDEX, TROPONINI in the last 168  hours.  HbA1C: Hgb A1c MFr Bld  Date/Time Value Ref Range Status  08/02/2019 07:30 PM 5.6 4.8 - 5.6 % Final    Comment:    (NOTE) Pre diabetes:          5.7%-6.4% Diabetes:              >  6.4% Glycemic control for   <7.0% adults with diabetes     CBG: Recent Labs  Lab 08/23/19 1134 08/23/19 1159 08/23/19 1443 08/23/19 1956 08/24/19 0124  GLUCAP 39* 70 142* 173* 174*    Review of Systems:   See HPI  Past Medical History  He,  has a past medical history of Anemia due to chronic kidney disease, Cardiac arrest ([place]) (07/23/2019), End-stage renal disease on hemodialysis ([place]), and Personal history of MRSA (methicillin resistant Staphylococcus aureus) (07/2020).   Surgical History    Past Surgical History:  Procedure Laterality Date  . IR FLUORO GUIDE CV LINE LEFT  08/19/2019  . IR US GUIDE VASC ACCESS LEFT  08/19/2019     Social History   reports that he has been smoking cigarettes. He has never used smokeless tobacco. He reports current alcohol use. He reports current drug use. Drug: Marijuana.   Family History   His family history is not on file.   Allergies Allergies  Allergen Reactions  . Amlodipine Itching  . Dacarbazine And Related Nausea And Vomiting  . Fentanyl Itching  . Hydrocodone Nausea And Vomiting  . Propofol Itching     Home Medications  Prior to Admission medications   Medication Sig Start Date End Date Taking? Authorizing Provider  b complex-vitamin c-folic acid (NEPHRO-VITE) 0.8 MG TABS tablet Take 1 tablet by mouth daily. 05/18/19   [provider]  calcium acetate (PHOSLO) 667 MG tablet Take 1,334 mg by mouth 3 (three) times daily. 07/21/19   [provider]  COLCRYS 0.6 MG tablet Take 0.6 mg by mouth daily as needed (gout flare).  03/24/19   [provider]  hydrALAZINE (APRESOLINE) 25 MG tablet Take 37.5 mg by mouth 3 (three) times daily. 03/29/19   [provider]  isosorbide mononitrate (IMDUR) 30 MG 24 hr  tablet Take 30 mg by mouth daily. 03/29/19   [provider]  LEVEMIR FLEXTOUCH 100 UNIT/ML Pen Inject 25 Units into the skin daily. 07/30/19   [provider]  metoprolol succinate (TOPROL-XL) 50 MG 24 hr tablet Take 50 mg by mouth daily. 03/29/19   [provider]  NOVOLIN 70/30 FLEXPEN (70-30) 100 UNIT/ML PEN Inject 20 Units into the skin daily. 03/11/19   [provider]  NOVOLOG FLEXPEN 100 UNIT/ML FlexPen Inject 1 Units into the skin at bedtime. 07/30/19   [provider]  oxyCODONE-acetaminophen (PERCOCET) 7.5-325 MG tablet Take 1 tablet by mouth every 4 (four) hours as needed. 07/26/19   [provider]  oxyCODONE-acetaminophen (PERCOCET/ROXICET) 5-325 MG tablet Take 1 tablet by mouth 4 (four) times daily as needed. 07/19/19   [provider]  pantoprazole (PROTONIX) 40 MG tablet Take 40 mg by mouth daily. 06/23/19   [provider]  RENVELA 800 MG tablet Take 2,400 mg by mouth 3 (three) times daily. 03/11/19   [provider]  rosuvastatin (CRESTOR) 5 MG tablet Take 5 mg by mouth daily. 03/11/19   [provider]  TRULICITY [id number] SOPN Inject 1 mg into the skin once a week. 07/30/19   [provider]  VELPHORO 500 MG chewable tablet Chew 500 mg by mouth 3 (three) times daily. 06/08/19   [provider]  VENTOLIN HFA 108 (90 Base) MCG/ACT inhaler Inhale 2 puffs into the lungs 4 (four) times daily as needed for wheezing or shortness of breath. 06/23/19   [provider]     Critical care time: [place], DO [facility], PGY-2 08/24/2019 7:07 AM

## 2019-08-24 NOTE — Progress Notes (Signed)
Lower extremity venous duplex has been completed. Preliminary results can be found in CV Proc through chart review.  Results were given to the patient's nurse, Loma Sousa.  08/24/19 12:32 PM Carlos Levering RVT

## 2019-08-24 NOTE — Consult Note (Signed)
Reason for Consult:BLE skin changes Referring Physician: R Agarwala  [name] is an 54 y.o. male.  HPI: [name] was admitted 1/28 after a witnessed cardiac arrest. He has remained persistently hypotensive requiring IV pressor support. He has chronic calciphylaxis that has required debridement in the past. He notes that the left thigh wounds do not bother him too much. Concern was also noted regarding his right foot and possible necrosis there and orthopedic surgery was consulted. He has no sensation in the foot.  Past Medical History:  Diagnosis Date  . Anemia due to chronic kidney disease   . Cardiac arrest ([place]) 07/17/2019  . End-stage renal disease on hemodialysis ([place])   . Personal history of MRSA (methicillin resistant Staphylococcus aureus) 07/2020   pcr positive    Past Surgical History:  Procedure Laterality Date  . IR FLUORO GUIDE CV LINE LEFT  08/19/2019  . IR US GUIDE VASC ACCESS LEFT  08/19/2019    History reviewed. No pertinent family history.  Social History:  reports that he has been smoking cigarettes. He has never used smokeless tobacco. He reports current alcohol use. He reports current drug use. Drug: Marijuana.  Allergies:  Allergies  Allergen Reactions  . Amlodipine Itching  . Dacarbazine And Related Nausea And Vomiting  . Fentanyl Itching  . Hydrocodone Nausea And Vomiting  . Propofol Itching    Medications: I have reviewed the patient's current medications.  Results for orders placed or performed during the hospital encounter of 08/10/2019 (from the past 48 hour(s))  Renal function panel     Status: Abnormal   Collection Time: 08/22/19  9:46 AM  Result Value Ref Range   Sodium 140 135 - 145 mmol/L   Potassium 4.6 3.5 - 5.1 mmol/L   Chloride 99 98 - 111 mmol/L   CO2 18 (L) 22 - 32 mmol/L   Glucose, Bld 153 (H) 70 - 99 mg/dL   BUN 64 (H) 6 - 20 mg/dL   Creatinine, Ser 5.19 (H) 0.61 - 1.24 mg/dL   Calcium 7.7 (L) 8.9 - 10.3 mg/dL   Phosphorus  4.7 (H) 2.5 - 4.6 mg/dL   Albumin 2.1 (L) 3.5 - 5.0 g/dL   GFR calc non Af Amer 12 (L) >60 mL/min   GFR calc Af Amer 13 (L) >60 mL/min   Anion gap 23 (H) 5 - 15    Comment: CONSISTENT WITH PREVIOUS RESULT Performed at [facility], [address], [place], [place] [address]   CBC     Status: Abnormal   Collection Time: 08/22/19  9:46 AM  Result Value Ref Range   WBC 16.6 (H) 4.0 - 10.5 K/uL   RBC 2.85 (L) 4.22 - 5.81 MIL/uL   Hemoglobin 7.8 (L) 13.0 - 17.0 g/dL   HCT 26.1 (L) 39.0 - 52.0 %   MCV 91.6 80.0 - 100.0 fL   MCH 27.4 26.0 - 34.0 pg   MCHC 29.9 (L) 30.0 - 36.0 g/dL   RDW 20.6 (H) 11.5 - 15.5 %   Platelets 260 150 - 400 K/uL   nRBC 0.1 0.0 - 0.2 %    Comment: Performed at [facility] [address], [place], [place] [address]  Glucose, capillary     Status: Abnormal   Collection Time: 08/22/19 12:04 PM  Result Value Ref Range   Glucose-Capillary 152 (H) 70 - 99 mg/dL  Glucose, capillary     Status: Abnormal   Collection Time: 08/22/19  3:41 PM  Result  Value Ref Range   Glucose-Capillary 114 (H) 70 - 99 mg/dL  Glucose, capillary     Status: Abnormal   Collection Time: 08/22/19  7:33 PM  Result Value Ref Range   Glucose-Capillary 175 (H) 70 - 99 mg/dL  Glucose, capillary     Status: Abnormal   Collection Time: 08/22/19 10:47 PM  Result Value Ref Range   Glucose-Capillary 149 (H) 70 - 99 mg/dL  Glucose, random     Status: Abnormal   Collection Time: 08/22/19 11:00 PM  Result Value Ref Range   Glucose, Bld 150 (H) 70 - 99 mg/dL    Comment: Performed at [facility] [address] [place], [place] [address]  Glucose, capillary     Status: Abnormal   Collection Time: 08/22/19 11:40 PM  Result Value Ref Range   Glucose-Capillary 164 (H) 70 - 99 mg/dL  Glucose, capillary     Status: Abnormal   Collection Time: 08/23/19  1:44 AM  Result Value Ref Range   Glucose-Capillary 140 (H) 70 - 99 mg/dL  CBC     Status: Abnormal   Collection Time:  08/23/19  2:02 AM  Result Value Ref Range   WBC 14.8 (H) 4.0 - 10.5 K/uL   RBC 2.77 (L) 4.22 - 5.81 MIL/uL   Hemoglobin 7.5 (L) 13.0 - 17.0 g/dL   HCT 25.6 (L) 39.0 - 52.0 %   MCV 92.4 80.0 - 100.0 fL   MCH 27.1 26.0 - 34.0 pg   MCHC 29.3 (L) 30.0 - 36.0 g/dL   RDW 20.2 (H) 11.5 - 15.5 %   Platelets 261 150 - 400 K/uL   nRBC 0.0 0.0 - 0.2 %    Comment: Performed at [facility], [place]. [address], [place], [place] [address]  Renal function panel     Status: Abnormal   Collection Time: 08/23/19  2:02 AM  Result Value Ref Range   Sodium 136 135 - 145 mmol/L   Potassium 4.5 3.5 - 5.1 mmol/L   Chloride 93 (L) 98 - 111 mmol/L   CO2 20 (L) 22 - 32 mmol/L   Glucose, Bld 139 (H) 70 - 99 mg/dL   BUN 75 (H) 6 - 20 mg/dL   Creatinine, Ser 5.92 (H) 0.61 - 1.24 mg/dL   Calcium 7.6 (L) 8.9 - 10.3 mg/dL   Phosphorus 4.8 (H) 2.5 - 4.6 mg/dL   Albumin 2.0 (L) 3.5 - 5.0 g/dL   GFR calc non Af Amer 10 (L) >60 mL/min   GFR calc Af Amer 11 (L) >60 mL/min   Anion gap 23 (H) 5 - 15    Comment: Performed at [facility], [address] [place], [place] [address]  Glucose, capillary     Status: Abnormal   Collection Time: 08/23/19  3:43 AM  Result Value Ref Range   Glucose-Capillary 110 (H) 70 - 99 mg/dL  Glucose, capillary     Status: None   Collection Time: 08/23/19  8:05 AM  Result Value Ref Range   Glucose-Capillary 71 70 - 99 mg/dL  Glucose, capillary     Status: Abnormal   Collection Time: 08/23/19 11:34 AM  Result Value Ref Range   Glucose-Capillary 39 (LL) 70 - 99 mg/dL   Comment 1 Repeat Test    Comment 2 Call MD NNP PA CNM   Glucose, capillary     Status: None   Collection Time: 08/23/19 11:59 AM  Result Value Ref Range   Glucose-Capillary 70  70 - 99 mg/dL  Glucose, random     Status: Abnormal   Collection Time: 08/23/19  1:49 PM  Result Value Ref Range   Glucose, Bld 174 (H) 70 - 99 mg/dL    Comment: Performed at [facility] [address] [place], [place]  [address]  Glucose, capillary     Status: Abnormal   Collection Time: 08/23/19  2:43 PM  Result Value Ref Range   Glucose-Capillary 142 (H) 70 - 99 mg/dL  Glucose, capillary     Status: Abnormal   Collection Time: 08/23/19  7:56 PM  Result Value Ref Range   Glucose-Capillary 173 (H) 70 - 99 mg/dL  Glucose, random     Status: Abnormal   Collection Time: 08/23/19 11:00 PM  Result Value Ref Range   Glucose, Bld 125 (H) 70 - 99 mg/dL    Comment: Performed at [facility] [address], [place], [place] [address]  Glucose, capillary     Status: Abnormal   Collection Time: 08/24/19  1:24 AM  Result Value Ref Range   Glucose-Capillary 174 (H) 70 - 99 mg/dL  Magnesium     Status: None   Collection Time: 08/24/19  2:32 AM  Result Value Ref Range   Magnesium 2.3 1.7 - 2.4 mg/dL    Comment: Performed at [facility], [place] [address] [place], [place] [address]  CBC     Status: Abnormal   Collection Time: 08/24/19  2:40 AM  Result Value Ref Range   WBC 16.0 (H) 4.0 - 10.5 K/uL   RBC 2.65 (L) 4.22 - 5.81 MIL/uL   Hemoglobin 7.3 (L) 13.0 - 17.0 g/dL   HCT 23.8 (L) 39.0 - 52.0 %   MCV 89.8 80.0 - 100.0 fL   MCH 27.5 26.0 - 34.0 pg   MCHC 30.7 30.0 - 36.0 g/dL   RDW 19.9 (H) 11.5 - 15.5 %   Platelets 292 150 - 400 K/uL   nRBC 0.0 0.0 - 0.2 %    Comment: Performed at [facility], [place] [address] [place], [place] [address]  Comprehensive metabolic panel     Status: Abnormal   Collection Time: 08/24/19  2:40 AM  Result Value Ref Range   Sodium 139 135 - 145 mmol/L   Potassium 4.6 3.5 - 5.1 mmol/L   Chloride 95 (L) 98 - 111 mmol/L   CO2 18 (L) 22 - 32 mmol/L   Glucose, Bld 154 (H) 70 - 99 mg/dL   BUN 78 (H) 6 - 20 mg/dL   Creatinine, Ser 5.96 (H) 0.61 - 1.24 mg/dL   Calcium 8.0 (L) 8.9 - 10.3 mg/dL   Total Protein 5.9 (L) 6.5 - 8.1 g/dL   Albumin 1.9 (L) 3.5 - 5.0 g/dL   AST 15 15 - 41 U/L   ALT 20 0 - 44 U/L   Alkaline Phosphatase 127 (H) 38 - 126 U/L   Total  Bilirubin 0.6 0.3 - 1.2 mg/dL   GFR calc non Af Amer 10 (L) >60 mL/min   GFR calc Af Amer 11 (L) >60 mL/min   Anion gap 26 (H) 5 - 15    Comment: Performed at [facility], [place] [address] [place], [place] [address]  Glucose, capillary     Status: Abnormal   Collection Time: 08/24/19  7:56 AM  Result Value Ref Range   Glucose-Capillary 102 (H) 70 - 99 mg/dL    CT FEMUR LEFT W CONTRAST  Result Date: 08/22/2019 CLINICAL DATA:  Calciphylaxis and foul-smelling leg wound. EXAM: CT OF THE LOWER RIGHT EXTREMITY WITH CONTRAST TECHNIQUE: Multidetector CT imaging of the lower right extremity was performed according to the standard protocol following intravenous contrast administration. COMPARISON:  None. CONTRAST:  151mL OMNIPAQUE IOHEXOL 300 MG/ML  SOLN FINDINGS: Bones/Joint/Cartilage Osteopenia and coarsening of trabecula with subchondral sacroiliac erosion, attributed to renal osteodystrophy. No evidence of osteomyelitis. 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 Ligaments Suboptimally assessed by CT. Muscles and Tendons Broad skin defect along the anterolateral left thigh without undermining or collection. A knee joint effusion is present without any articular erosions or significant size to implicate septic effusion. Soft tissues Bilateral subcutaneous reticulation, likely from volume overload. There could be an element of cellulitis which would be better detected on exam. No evidence of fasciitis. No soft tissue emphysema. Heavily calcified vessels especially of the superficial femoral artery which is not visibly enhancing. IMPRESSION: 1. Negative for abscess or osteomyelitis. 2. Renal osteodystrophy. Electronically Signed   By: Monte Fantasia M.D.   On: 08/22/2019 14:22    Review of Systems  HENT: Negative for ear discharge, ear pain, hearing loss and tinnitus.   Eyes: Negative for photophobia and pain.  Respiratory: Negative for cough and shortness of breath.     Cardiovascular: Negative for chest pain.  Gastrointestinal: Negative for abdominal pain, nausea and vomiting.  Genitourinary: Negative for dysuria, flank pain, frequency and urgency.  Musculoskeletal: Positive for myalgias (Left thigh). Negative for back pain and neck pain.  Neurological: Negative for dizziness and headaches.  Hematological: Does not bruise/bleed easily.  Psychiatric/Behavioral: The patient is not nervous/anxious.    Blood pressure (!) 107/56, pulse 97, temperature 98.4 F (36.9 C), temperature source Oral, resp. rate 15, height 5\' 9"  (1.753 m), weight 93.3 kg, SpO2 99 %. Physical Exam  Constitutional: He appears well-developed and well-nourished. No distress.  HENT:  Head: Normocephalic and atraumatic.  Eyes: Conjunctivae are normal. Right eye exhibits no discharge. Left eye exhibits no discharge. No scleral icterus.  Cardiovascular: Normal rate and regular rhythm.  Respiratory: Effort normal. No respiratory distress.  Musculoskeletal:     Cervical back: Normal range of motion.     Comments: RLE No traumatic wounds or ecchymosis             Multiple dry, nummular lesions entire leg  No TTP, Toes 1-3 hyperpigmented, some SQE in great toe  No knee or ankle effusion  Knee stable to varus/ valgus and anterior/posterior stress  Sens DPN, SPN, TN absent  Motor EHL, ext, flex, evers grossly intact  DP 0, PT 0, 3+ pitting edema  LLE No traumatic wounds or ecchymosis  Extensive thigh wounds lateral and medial with necrotic eschar and foul odor, s/p BKA  Neurological: He is alert.  Skin: Skin is warm and dry. He is not diaphoretic.  Psychiatric: He has a normal mood and affect. His behavior is normal.            Assessment/Plan: Left leg calciphylaxis -- I recommended plastic surgery involvement. If he undergoes debridement will need their expertise. Dr. Sharol Given to evaluate tomorrow am. RLE foot ischemia -- Suspect devascularized foot. ABI's pending. If abnormal  recommended vascular surgery consult but not sure he'd be candidate for revascularization. May need BKA. Dr. Sharol Given to evaluate in am. Multiple medical problems including ESRD on HD, anemia, DM, and hypotension -- per primary service    Lisette Abu, PA-C Orthopedic Surgery [phone] 08/24/2019, 9:39  AM  

## 2019-08-24 NOTE — Progress Notes (Signed)
Received call from Carlos L[facility]ing RVT regarding patient's vascular US results Dr Lynetta Mare made aware

## 2019-08-24 NOTE — Progress Notes (Signed)
eLink Physician-Brief Progress Note Patient Name: [name] DOB: 09-05-1965 MRN: [id number]   Date of Service  08/24/2019  HPI/Events of Note  Pt requesting additional pain medications, needs a.m. labs.  eICU Interventions  PRN Tylenol ordered, CMET, Mg++ ordered for the  a.m.        [name] [name] 08/24/2019, 2:33 AM

## 2019-08-25 DIAGNOSIS — I96 Gangrene, not elsewhere classified: Secondary | ICD-10-CM

## 2019-08-25 DIAGNOSIS — I70262 Atherosclerosis of native arteries of extremities with gangrene, left leg: Secondary | ICD-10-CM

## 2019-08-25 LAB — GLUCOSE, RANDOM
Glucose, Bld: 142 mg/dL — ABNORMAL HIGH (ref 70–99)
Glucose, Bld: 98 mg/dL (ref 70–99)

## 2019-08-25 LAB — CBC
HCT: 24.6 % — ABNORMAL LOW (ref 39.0–52.0)
Hemoglobin: 7.4 g/dL — ABNORMAL LOW (ref 13.0–17.0)
MCH: 26.9 pg (ref 26.0–34.0)
MCHC: 30.1 g/dL (ref 30.0–36.0)
MCV: 89.5 fL (ref 80.0–100.0)
Platelets: 368 10*3/uL (ref 150–400)
RBC: 2.75 MIL/uL — ABNORMAL LOW (ref 4.22–5.81)
RDW: 19.6 % — ABNORMAL HIGH (ref 11.5–15.5)
WBC: 15.7 10*3/uL — ABNORMAL HIGH (ref 4.0–10.5)
nRBC: 0 % (ref 0.0–0.2)

## 2019-08-25 LAB — GLUCOSE, CAPILLARY
Glucose-Capillary: 98 mg/dL (ref 70–99)
Glucose-Capillary: 137 mg/dL — ABNORMAL HIGH (ref 70–99)
Glucose-Capillary: 114 mg/dL — ABNORMAL HIGH (ref 70–99)
Glucose-Capillary: 149 mg/dL — ABNORMAL HIGH (ref 70–99)
Glucose-Capillary: 86 mg/dL (ref 70–99)
Glucose-Capillary: 93 mg/dL (ref 70–99)

## 2019-08-25 LAB — HEPARIN LEVEL (UNFRACTIONATED)
Heparin Unfractionated: <0.1 IU/mL — ABNORMAL LOW (ref 0.30–0.70)
Heparin Unfractionated: <0.1 IU/mL — ABNORMAL LOW (ref 0.30–0.70)

## 2019-08-25 MED ORDER — ONDANSETRON HCL 4 MG/2ML IJ SOLN
4.0000 mg | Freq: Four times a day (QID) | INTRAMUSCULAR | Status: DC | PRN
  Administered 2019-08-25: 03:00:00 4 mg via INTRAVENOUS
  Filled 2019-08-25: qty 2

## 2019-08-25 MED ORDER — POLYETHYLENE GLYCOL 3350 17 G PO PACK
17.0000 g | PACK | Freq: Every day | ORAL | Status: DC | PRN
  Administered 2019-08-25 – 2019-08-26 (×2): 17 g via ORAL
  Filled 2019-08-25 (×2): qty 1

## 2019-08-25 MED ORDER — ALTEPLASE 2 MG IJ SOLR
4.0000 mg | Freq: Once | INTRAMUSCULAR | Status: AC
  Administered 2019-08-25: 17:00:00 4 mg
  Filled 2019-08-25: qty 4

## 2019-08-25 MED ORDER — HEPARIN SODIUM (PORCINE) 1000 UNIT/ML IJ SOLN
INTRAMUSCULAR | Status: AC
  Filled 2019-08-25: qty 10

## 2019-08-25 MED ORDER — HEPARIN BOLUS VIA INFUSION
2500.0000 [IU] | Freq: Once | INTRAVENOUS | Status: AC
  Administered 2019-08-25: 02:00:00 2500 [IU] via INTRAVENOUS
  Filled 2019-08-25: qty 2500

## 2019-08-25 MED ORDER — INSULIN ASPART 100 UNIT/ML ~~LOC~~ SOLN
0.0000 [IU] | Freq: Three times a day (TID) | SUBCUTANEOUS | Status: DC
  Administered 2019-08-25 – 2019-08-27 (×4): 2 [IU] via SUBCUTANEOUS

## 2019-08-25 NOTE — Progress Notes (Signed)
Pharmacy Antibiotic Note  [name] is a 54 y.o. male admitted on 07/26/2019 with MRSA PNA s/p treatment now with suspected deep soft tissue infection or gangrene.  Pharmacy has been consulted for vancomycin and cefepime dosing.  Patient is on dialysis, now back on HD MWF. Currently afebrile, wbc 15.7. Doses adjusted based on renal function.    Plan: Cefepime 1g Q24 hr Vancomycin 750mg  post HD - MWF Consider pre-HD level after 2-3 doses  Monitor cultures, clinical status, renal fx Narrow abx as able and f/u duration    Height: 5\' 9"  (175.3 cm) Weight: 205 lb 14.6 oz (93.4 kg) IBW/kg (Calculated) : 70.7  Temp (24hrs), Avg:98.5 F (36.9 C), Min:97.8 F (36.6 C), Max:99.6 F (37.6 C)  Recent Labs  Lab 08/20/19 0345 08/20/19 0730 08/21/19 0443 08/21/19 0443 08/22/19 0427 08/22/19 0946 08/23/19 0202 08/24/19 0240 08/25/19 0541  WBC   < >  --  13.7*   < > 13.1* 16.6* 14.8* 16.0* 15.7*  CREATININE  --  3.87* 3.62*  --   --  5.19* 5.92* 5.96*  --    < > = values in this interval not displayed.    Estimated Creatinine Clearance: 15.8 mL/min (A) (by C-G formula based on SCr of 5.96 mg/dL (H)).     Antimicrobials this admission: Cefepime 2/7>>  Vancomycin 1/28 > 2/4; 2/7>>  Zosyn 1/28 > 1/31  Dose adjustments this admission:  1/31 VT (on CRRT) = 17- cont 750 q 24  Microbiology results:  1/28 MRSA + 1/28 BCx x 2: NG 1/28 Aspirate cx: moderate MRSA  1/28 covid/flu neg   Erin Hearing PharmD., BCPS Clinical Pharmacist 08/25/2019 2:06 PM   Please check AMION for all [facility] phone numbers After 10:00 PM, call [place] [phone]

## 2019-08-25 NOTE — Consult Note (Signed)
ORTHOPAEDIC CONSULTATION  REQUESTING PHYSICIAN: Kipp Brood, MD  Chief Complaint: Gangrenous toes right foot with cold ischemic left transtibial amputation with ulcerations  HPI: [name] is a 54 y.o. 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  Past Medical History:  Diagnosis Date  . Anemia due to chronic kidney disease   . Cardiac arrest ([place]) 07/24/2019  . End-stage renal disease on hemodialysis ([place])   . Personal history of MRSA (methicillin resistant Staphylococcus aureus) 07/2020   pcr positive   Past Surgical History:  Procedure Laterality Date  . IR FLUORO GUIDE CV LINE LEFT  08/19/2019  . IR US GUIDE VASC ACCESS LEFT  08/19/2019   Social History   Socioeconomic History  . Marital status: Married    Spouse name: Not on file  . Number of children: Not on file  . Years of education: Not on file  . Highest education level: Not on file  Occupational History  . Not on file  Tobacco Use  . Smoking status: Current Some Day Smoker    Types: Cigarettes  . Smokeless tobacco: Never Used  Substance and Sexual Activity  . Alcohol use: Yes    Comment: occasional  . Drug use: Yes    Types: Marijuana  . Sexual activity: Not on file  Other Topics Concern  . Not on file  Social History Narrative  . Not on file   Social Determinants of Health   Financial Resource Strain:   . Difficulty of Paying Living Expenses: Not on file  Food Insecurity:   . Worried About Charity fundraiser in the Last Year: Not on file  . Ran Out of Food in the Last Year: Not on file  Transportation Needs:   . Lack of Transportation (Medical): Not on file  . Lack of Transportation (Non-Medical): Not on file  Physical Activity:   . Days of Exercise per Week: Not on file  . Minutes of Exercise per  Session: Not on file  Stress:   . Feeling of Stress : Not on file  Social Connections:   . Frequency of Communication with Friends and Family: Not on file  . Frequency of Social Gatherings with Friends and Family: Not on file  . Attends Religious Services: Not on file  . Active Member of Clubs or Organizations: Not on file  . Attends Archivist Meetings: Not on file  . Marital Status: Not on file   History reviewed. No pertinent family history. - negative except otherwise stated in the family history section Allergies  Allergen Reactions  . Amlodipine Itching  . Dacarbazine And Related Nausea And Vomiting  . Fentanyl Itching  . Hydrocodone Nausea And Vomiting  . Propofol Itching   Prior to Admission medications   Medication Sig Start Date End Date Taking? Authorizing Provider  b complex-vitamin c-folic acid (NEPHRO-VITE) 0.8 MG TABS tablet Take 1 tablet by mouth daily. 05/18/19   [provider]  calcium acetate (PHOSLO) 667 MG tablet Take 1,334 mg by mouth 3 (three) times daily. 07/21/19   [provider]  COLCRYS 0.6 MG tablet Take 0.6 mg by mouth daily as needed (gout flare).  03/24/19   [provider]  hydrALAZINE (APRESOLINE) 25 MG tablet Take 37.5 mg by mouth 3 (three) times daily. 03/29/19   [provider]  isosorbide mononitrate (IMDUR) 30 MG 24 hr tablet Take 30 mg by mouth daily. 03/29/19   [provider]  LEVEMIR FLEXTOUCH 100 UNIT/ML Pen Inject 25 Units into the skin daily. 07/30/19   [provider]  metoprolol succinate (TOPROL-XL) 50 MG 24 hr tablet Take 50 mg by mouth daily. 03/29/19   [provider]  NOVOLIN 70/30 FLEXPEN (70-30) 100 UNIT/ML PEN Inject 20 Units into the skin daily. 03/11/19   [provider]  NOVOLOG FLEXPEN 100 UNIT/ML FlexPen Inject 1 Units into the skin at bedtime. 07/30/19   [provider]  oxyCODONE-acetaminophen (PERCOCET) 7.5-325 MG tablet Take 1 tablet by  mouth every 4 (four) hours as needed. 07/26/19   [provider]  oxyCODONE-acetaminophen (PERCOCET/ROXICET) 5-325 MG tablet Take 1 tablet by mouth 4 (four) times daily as needed. 07/19/19   [provider]  pantoprazole (PROTONIX) 40 MG tablet Take 40 mg by mouth daily. 06/23/19   [provider]  RENVELA 800 MG tablet Take 2,400 mg by mouth 3 (three) times daily. 03/11/19   [provider]  rosuvastatin (CRESTOR) 5 MG tablet Take 5 mg by mouth daily. 03/11/19   [provider]  TRULICITY [id number] [id number] SOPN Inject 1 mg into the skin once a week. 07/30/19   [provider]  VELPHORO 500 MG chewable tablet Chew 500 mg by mouth 3 (three) times daily. 06/08/19   [provider]  VENTOLIN HFA 108 (90 Base) MCG/ACT inhaler Inhale 2 puffs into the lungs 4 (four) times daily as needed for wheezing or shortness of breath. 06/23/19   [provider]   VAS [place] LOWER EXTREMITY VENOUS (DVT)  Result Date: 08/24/2019  Lower Venous DVT Study Indications: Edema.  Risk Factors: Surgery L BKA. Limitations: Body habitus, poor ultrasound/tissue interface, bandages, open wound and patient pain tolerance, patient positioning, patient immobility. Comparison Study: No prior studies. Performing Technologist: Oliver Hum RVT  Examination Guidelines: A complete evaluation includes B-mode imaging, spectral Doppler, color Doppler, and power Doppler as needed of all accessible portions of each vessel. Bilateral testing is considered an integral part of a complete examination. Limited examinations for reoccurring indications may be performed as noted. The reflux portion of the exam is performed with the patient in reverse Trendelenburg.  +---------+---------------+---------+-----------+----------+--------------+ RIGHT    CompressibilityPhasicitySpontaneityPropertiesThrombus Aging +---------+---------------+---------+-----------+----------+--------------+ CFV       Full           No       No                                  +---------+---------------+---------+-----------+----------+--------------+ SFJ      Full                                                        +---------+---------------+---------+-----------+----------+--------------+ FV Prox  Full                                                        +---------+---------------+---------+-----------+----------+--------------+ FV Mid   Full                                                        +---------+---------------+---------+-----------+----------+--------------+  FV DistalFull                                                        +---------+---------------+---------+-----------+----------+--------------+ PFV      Full                                                        +---------+---------------+---------+-----------+----------+--------------+ POP      Full           No       No                                  +---------+---------------+---------+-----------+----------+--------------+ PTV      Full                                                        +---------+---------------+---------+-----------+----------+--------------+ PERO     Full                                                        +---------+---------------+---------+-----------+----------+--------------+ Arterial flow detected in the popliteal, posterior tibial, peroneal, and anterior tibial arteries demonstrate multiphasic waveforms.  +-------+---------------+---------+-----------+----------+--------------+ LEFT   CompressibilityPhasicitySpontaneityPropertiesThrombus Aging +-------+---------------+---------+-----------+----------+--------------+ CFV    None           No       No                   Acute          +-------+---------------+---------+-----------+----------+--------------+ FV ProxNone           No       No                   Acute           +-------+---------------+---------+-----------+----------+--------------+ Unable to obtain images distal to the proximal femoral vein due to open wounds and bandages. Unable to obtain iliac images due to patient body habitus, edema, and positioning.    Summary: RIGHT: - There is no evidence of deep vein thrombosis in the lower extremity. However, portions of this examination were limited- see technologist comments above.  - No cystic structure found in the popliteal fossa.  LEFT: - Findings consistent with acute deep vein thrombosis involving the left common femoral vein, and left femoral vein. - No cystic structure found in the popliteal fossa.  *See table(s) above for measurements and observations. Electronically signed by Deitra Mayo MD on 08/24/2019 at 3:09:08 PM.    Final    - pertinent xrays, CT, MRI studies were reviewed and independently interpreted  Positive ROS: All other systems have been reviewed and were otherwise negative with the exception of those mentioned in the HPI and as above.  Physical Exam: General: Alert, no acute distress Psychiatric: Patient  is competent for consent with normal mood and affect Lymphatic: No axillary or cervical lymphadenopathy Cardiovascular: No pedal edema Respiratory: No cyanosis, no use of accessory musculature GI: No organomegaly, abdomen is soft and non-tender    Images:  @ENCIMAGES @  Labs:  Lab Results  Component Value Date   HGBA1C 5.6 07/31/2019   REPTSTATUS 08/17/2019 FINAL 08/15/2019   GRAMSTAIN  08/14/2019    FEW WBC PRESENT, PREDOMINANTLY PMN MODERATE GRAM POSITIVE COCCI IN PAIRS IN CLUSTERS MODERATE GRAM NEGATIVE RODS RARE GRAM NEGATIVE COCCOBACILLI Performed at [facility], [place] [address] [place], [place] [address]    CULT NO GROWTH 5 DAYS 07/23/2019   LABORGA METHICILLIN RESISTANT STAPHYLOCOCCUS AUREUS 07/23/2019    Lab Results  Component Value Date   ALBUMIN 1.9 (L) 08/24/2019   ALBUMIN 2.0 (L) 08/23/2019    ALBUMIN 2.1 (L) 08/22/2019    Neurologic: Patient does not have protective sensation bilateral lower extremities.   MUSCULOSKELETAL:   Skin: Examination patient has extensive ulceration on the left thigh.  His left transtibial amputation residual limb is cold and ischemic.  Examination of the right foot patient has black dry gangrenous toes of the right foot.  I cannot Doppler a pulse of the dorsalis pedis on the right this may be due to the swelling.  I also cannot Doppler a popliteal pulse also possibly due to swelling.  Patient has vascular access in the right groin.  Patient does not have ascending cellulitis there is no pain no tenderness to palpation.  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  Plan: Plan: Ankle-brachial indices is ordered.  Discussed with the patient potential for above-the-knee amputation on the right with possible revision amputation on the left.  Patient states he does not want to pursue any further surgery patient states he would like to be comfortable.  Patient states he has put up a long hard fight and he is tired.  Thank you for the consult and the opportunity to see Mr. Maxwell Caul, MD [facility] [phone] 8:04 AM

## 2019-08-25 NOTE — Progress Notes (Signed)
Pt's HD cath not working today, will TPA and reassess in the am.  Whether he will need further HD or not will depend on other factors , palliative care consult , etc.  No urgent need though for now.   Kelly Splinter, MD 08/25/2019, 4:47 PM

## 2019-08-25 NOTE — Progress Notes (Signed)
eLink Physician-Brief Progress Note Patient Name: [name] DOB: 1965/10/27 MRN: [id number]   Date of Service  08/25/2019  HPI/Events of Note  Pt with nausea and some vomiting.  eICU Interventions  Zofran 4 mg iv Q 6 hours PRN nausea/ vomiting.        [name] Ogan 08/25/2019, 2:42 AM

## 2019-08-25 NOTE — Progress Notes (Signed)
[facility] for heparin Indication: DVT  Allergies  Allergen Reactions  . Amlodipine Itching  . Dacarbazine And Related Nausea And Vomiting  . Fentanyl Itching  . Hydrocodone Nausea And Vomiting  . Propofol Itching    Patient Measurements: Height: 5\' 9"  (175.3 cm) Weight: 205 lb 14.6 oz (93.4 kg) IBW/kg (Calculated) : 70.7 Heparin Dosing Weight: 88kg  Vital Signs: Temp: 98.4 F (36.9 C) (02/10 1130) Temp Source: Oral (02/10 1130) BP: 94/55 (02/10 1300)  Labs: Recent Labs    08/23/19 0202 08/23/19 0202 08/24/19 0240 08/25/19 0110 08/25/19 0541 08/25/19 1101  HGB 7.5*   < > 7.3*  --  7.4*  --   HCT 25.6*  --  23.8*  --  24.6*  --   PLT 261  --  292  --  368  --   HEPARINUNFRC  --   --   --  <0.10*  --  <0.10*  CREATININE 5.92*  --  5.96*  --   --   --    < > = values in this interval not displayed.    Estimated Creatinine Clearance: 15.8 mL/min (A) (by C-G formula based on SCr of 5.96 mg/dL (H)).  Assessment: 54 year old male with newly diagnosed DVT in left femoral vein. Orders to start IV heparin, has not been on dvt px due to anemia.   Heparin level continues to be undetectable on gtt at 1550 units/hr. No issues with line or bleeding reported per RN.  Goal of Therapy:  Heparin level 0.3-0.7 units/ml Monitor platelets by anticoagulation protocol: Yes   Plan:  Increase heparin gtt to 1900 units/hr Will f/u 6 hr heparin level F/u s/s bleeding and Hgb closely  Erin Hearing PharmD., BCPS Clinical Pharmacist 08/25/2019 1:55 PM

## 2019-08-25 NOTE — Progress Notes (Signed)
Pt's HD catheter not working . Dr. Jonnie Finner notified and order received as follow : TPA for catheter.

## 2019-08-25 NOTE — Progress Notes (Signed)
NAME:  [name], MRN:  [id number], DOB:  1965/08/04, LOS: 16 ADMISSION DATE:  08/06/2019, CONSULTATION DATE: 07/25/2019 REFERRING MD: Dr. Roxanne Mins, CHIEF COMPLAINT: Cardiac arrest  Brief History   54 year old male who is status post cardiac arrest and septic shock secondary to MRSA pneumonia.  History of present illness   Patient was found unresponsive 08/09/2019 and had witnessed cardiac arrest, obtained ROSC after 3 minutes.  Patient required intubation for ongoing airway protection and unresponsiveness. Required vasopressor support for hypotension.  Additionally found to have MRSA pneumonia, currently being treated with vancomycin.  Patient extubated on 08/18/2019.  Patient continues to require pressors for BP and HR despite completed treatment of MRSA Pneumonia. Concern is growing for new source of infection or inflammation, including left thigh calciphylaxis, penile gangrene, or other pathology of right lower leg.    Past Medical History   Past Medical History:  Diagnosis Date  . Anemia due to chronic kidney disease   . Cardiac arrest ([place]) 08/01/2019  . End-stage renal disease on hemodialysis ([place])   . Personal history of MRSA (methicillin resistant Staphylococcus aureus) 07/2020   pcr positive    Significant Hospital Events   1/28 Admitted. On levophed and added vasopressin  1/29: O/N Hyperkalemic, CRRT for correction also GOC discussion with family, made DNR. Still on levophed and multiple analgesics/sedatives. 2/03: Extubated, off pressors 2/04: Attempted bedside central line placement, IR consulted and placed L External Jugular line. 2/07: ACTH stimulation test performed, results normal 2/09: Lower Extremity dopplers reveal DVT in L common femoral vein and L ("superficial") femoral vein  Consults:  Nephrology Orthopedics Plastics  Procedures:  2/4 - central line placement by IR  Significant Diagnostic Tests:  DG Chest 1 View  Result Date: 08/19/2019 CLINICAL  DATA:  Central line placement EXAM: CHEST  1 VIEW COMPARISON:  08/17/2019 FINDINGS: Frontal view of the chest demonstrates stable cardiac silhouette. Vascular stent right subclavian distribution unchanged. Central venous catheter from an inferior approach tip overlies the junction of the inferior vena cava and right atrium. I do not see any new central venous catheter is on this exam. Stable bibasilar scarring. No airspace disease, effusion, or pneumothorax. IMPRESSION: 1. Stable central venous catheter from an inferior approach. 2. No other central lines are identified on this exam. Electronically Signed   By: Randa Ngo M.D.   On: 08/19/2019 14:14   CT Head Wo Contrast  Result Date: 07/22/2019 CLINICAL DATA:  Cerebral hemorrhage suspected, unresponsive, found by friend post witnessed arrest EXAM: CT HEAD WITHOUT CONTRAST TECHNIQUE: Contiguous axial images were obtained from the base of the skull through the vertex without intravenous contrast. COMPARISON:  None. FINDINGS: Brain: No evidence of acute infarction, hemorrhage, hydrocephalus, extra-axial collection or mass lesion/mass effect. Symmetric prominence of the ventricles, cisterns and sulci compatible with parenchymal volume loss. Patchy areas of white matter hypoattenuation are most compatible with chronic microvascular angiopathy. Extensive dural calcifications are noted along the falx and tentorium. Vascular: Extensive calcification of the carotid siphons and vertebral arteries. No unexpected hyperdense vessel is evident. Skull: No calvarial fracture or suspicious osseous lesion. No scalp swelling or hematoma. Diffuse scalp edema is seen. Benign scattered scalp calcifications. Sinuses/Orbits: Patient is intubated at the time of exam with tract transesophageal and endotracheal tubes extending below the level of imaging. Secretions in the ethmoids and posterior nasopharynx are likely related to this intubation. Minimal mucosal thickening in the  maxillary sinuses. Prior lens extractions are noted. Mildly proptotic configuration of the globes which is symmetric  bilaterally. No abnormal thickening of the extraocular musculature or abnormality of the optic nerve should complexes or superior ophthalmic veins. Other: None IMPRESSION: 1. No CT evidence of acute intracranial process. 2. Chronic microvascular ischemic disease and parenchymal volume loss. 3. Diffuse soft tissue edema.  Correlate with hydration status. 4. Extensive intracranial atherosclerosis. 5. Secretions in the ethmoids and posterior nasopharynx are likely to intubation. 6. Mildly proptotic configuration of the globes which is symmetric bilaterally. Electronically Signed   By: Lovena Le M.D.   On: 07/18/2019 02:43   CT FEMUR LEFT W CONTRAST  Result Date: 08/22/2019 CLINICAL DATA:  Calciphylaxis and foul-smelling leg wound. EXAM: CT OF THE LOWER RIGHT EXTREMITY WITH CONTRAST TECHNIQUE: Multidetector CT imaging of the lower right extremity was performed according to the standard protocol following intravenous contrast administration. COMPARISON:  None. 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 No evidence of osteomyelitis. 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 Ligaments Suboptimally assessed by CT. Muscles and Tendons Broad skin defect along the anterolateral left thigh without undermining or collection. A knee joint effusion is present without any articular erosions or significant size to implicate septic effusion. Soft tissues Bilateral subcutaneous reticulation, likely from volume overload. There could be an element of cellulitis which would be better detected on exam. No evidence of fasciitis. No soft tissue emphysema. Heavily calcified vessels especially of the superficial femoral artery which is not visibly  enhancing. IMPRESSION: 1. Negative for abscess or osteomyelitis. 2. Renal osteodystrophy. Electronically Signed   By: Monte Fantasia M.D.   On: 08/22/2019 14:22   IR Fluoro Guide CV Line Left  Result Date: 08/19/2019 INDICATION: 54 year old with poor venous access, end-stage renal disease and hemodialysis. Recent cardiac arrest. EXAM: FLUOROSCOPIC AND ULTRASOUND GUIDED PLACEMENT OF A NON TUNNELED VENOUS CATHETER Physician: Stephan Minister. Henn, MD MEDICATIONS: None ANESTHESIA/SEDATION: None FLUOROSCOPY TIME:  Fluoroscopy Time: 1 minutes and 18 seconds COMPLICATIONS: None immediate. PROCEDURE: Informed consent was obtained for catheter placement. The patient was placed supine on the interventional table. Ultrasound confirmed a patent left external jugular vein. Ultrasound images were obtained for documentation. The left neck was prepped and draped in a sterile fashion. The left neck was anesthetized with 1% lidocaine. Maximal barrier sterile technique was utilized including caps, mask, sterile gowns, sterile gloves, sterile drape, hand hygiene and skin antiseptic. A 21 gauge needle directed into the left external jugular vein with ultrasound guidance. A micropuncture dilator set was placed. Central venogram was performed. Dual lumen Power PICC line was selected. Catheter was cut to 8 cm. A peel-away sheath was placed over a 0.018 wire. The Power PICC was advanced through the peel-away sheath and positioned in the left innominate vein. Both lumens aspirated and flushed well. Both lumens were flushed with saline, capped and clamped. Catheter was sutured to skin. Dressing was placed over the catheter. Fluoroscopic and ultrasound images were taken and saved for documentation. FINDINGS: Patent left external jugular vein. Central venogram demonstrates obstruction of the central left innominate vein with extensive collateral flow into the SVC. Tip of the venous catheter was placed in what appears to be the left innominate vein.  IMPRESSION: Placement of a left external jugular venous catheter. Catheter tip in the region of the left innominate vein. 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 Electronically Signed   By: Markus Daft M.D.   On: 08/19/2019 18:20   IR US  Guide Vasc Access Left  Result Date: 08/19/2019 INDICATION: 54 year old with poor venous access, end-stage renal disease and hemodialysis. Recent cardiac arrest. EXAM: FLUOROSCOPIC AND ULTRASOUND GUIDED PLACEMENT OF A NON TUNNELED VENOUS CATHETER Physician: Stephan Minister. Henn, MD MEDICATIONS: None ANESTHESIA/SEDATION: None FLUOROSCOPY TIME:  Fluoroscopy Time: 1 minutes and 18 seconds COMPLICATIONS: None immediate. PROCEDURE: Informed consent was obtained for catheter placement. The patient was placed supine on the interventional table. Ultrasound confirmed a patent left external jugular vein. Ultrasound images were obtained for documentation. The left neck was prepped and draped in a sterile fashion. The left neck was anesthetized with 1% lidocaine. Maximal barrier sterile technique was utilized including caps, mask, sterile gowns, sterile gloves, sterile drape, hand hygiene and skin antiseptic. A 21 gauge needle directed into the left external jugular vein with ultrasound guidance. A micropuncture dilator set was placed. Central venogram was performed. Dual lumen Power PICC line was selected. Catheter was cut to 8 cm. A peel-away sheath was placed over a 0.018 wire. The Power PICC was advanced through the peel-away sheath and positioned in the left innominate vein. Both lumens aspirated and flushed well. Both lumens were flushed with saline, capped and clamped. Catheter was sutured to skin. Dressing was placed over the catheter. Fluoroscopic and ultrasound images were taken and saved for documentation. FINDINGS: Patent left external jugular vein. Central venogram demonstrates obstruction of the central left innominate vein with extensive collateral flow into the SVC. Tip  of the venous catheter was placed in what appears to be the left innominate vein. IMPRESSION: Placement of a left external jugular venous catheter. Catheter tip in the region of the left innominate vein. 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 Electronically Signed   By: Markus Daft M.D.   On: 08/19/2019 18:20   DG Chest Port 1 View  Result Date: 08/17/2019 CLINICAL DATA:  Recent cardiac arrest, check endotracheal tube placement EXAM: PORTABLE CHEST 1 VIEW COMPARISON:  08/15/2018 FINDINGS: Endotracheal tube and gastric catheter are noted in satisfactory position. Femoral dialysis catheter is noted in satisfactory position. Mild atelectatic changes are noted in the left base. No effusion is seen. The right subclavian venous stent and brachial venous stent are noted. IMPRESSION: Mild left basilar atelectasis. Tubes and lines as described Electronically Signed   By: Inez Catalina M.D.   On: 08/17/2019 02:21   DG Chest Port 1 View  Result Date: 08/16/2019 CLINICAL DATA:  Respiratory failure. EXAM: PORTABLE CHEST 1 VIEW COMPARISON:  02/20/2018 FINDINGS: The endotracheal tube is 5.5 cm above the carina. The NG tube is coursing down the esophagus and into the stomach. A central venous catheter is noted in the right atrium coming up from the IVC. Vascular stents are noted. Aortic calcifications are stable. The heart is normal in size. Streaky left basilar atelectasis but overall improved lung aeration since prior study. IMPRESSION: 1. Stable support apparatus. 2. Improved lung aeration with residual streaky left basilar atelectasis. Electronically Signed   By: Marijo Sanes M.D.   On: 08/16/2019 07:07   DG CHEST PORT 1 VIEW  Result Date: 07/16/2019 CLINICAL DATA:  Endotracheal tube placement. EXAM: PORTABLE CHEST 1 VIEW COMPARISON:  Earlier same day FINDINGS: Endotracheal tube tip 5.5 cm from the carina. Orogastric tube enters the abdomen. Femoral venous catheter tip in the right atrium. Slight  worsening of density in the mid and lower lungs left more than right could be edema, pneumonia or atelectasis. Upper lungs remain clear. IMPRESSION: Endotracheal tube tip 5.5 cm above the carina.  Worsened bilateral lower lung density left more than right could be edema, pneumonia or atelectasis. Electronically Signed   By: Nelson Chimes M.D.   On: 08/11/2019 14:32   DG Chest Port 1 View  Result Date: 07/31/2019 CLINICAL DATA:  Post intubation EXAM: PORTABLE CHEST 1 VIEW COMPARISON:  Radiograph February 20, 2018 FINDINGS: Endotracheal tube terminates in the mid trachea, 5 cm from the carina. Transesophageal tube tip and side port distal to the GE junction, terminating below the level of imaging. Telemetry leads and pacer pads overlie the chest. Some bandlike opacity in the left mid lung likely reflect subsegmental atelectasis. No consolidation, features of edema, pneumothorax, or effusion. The cardiomediastinal contours are unremarkable. No acute osseous or soft tissue abnormality. IMPRESSION: Lines and tubes as above. Subsegmental atelectasis in the left mid lung. No other acute cardiopulmonary abnormality. Electronically Signed   By: Lovena Le M.D.   On: 08/08/2019 01:59   DG Abd Portable 1V  Result Date: 08/18/2019 CLINICAL DATA:  OG tube placement EXAM: PORTABLE ABDOMEN - 1 VIEW COMPARISON:  08/16/2019 FINDINGS: The tip of the OG tube projects over the gastric body. There is a femoral approach dialysis catheter that appears well position. The stomach is distended. The bowel gas pattern is otherwise nonspecific. IMPRESSION: Enteric tube terminates over the stomach. Electronically Signed   By: Constance Holster M.D.   On: 08/18/2019 00:21   DG Abd Portable 1V  Result Date: 08/16/2019 CLINICAL DATA:  Enteric tube placement EXAM: PORTABLE ABDOMEN - 1 VIEW COMPARISON:  None. FINDINGS: Enteric tube terminates within the body of the stomach. Femoral approach central line tip overlies the right atrium.  Visualized bowel gas pattern is unremarkable. IMPRESSION: Enteric tube terminates within the stomach. Electronically Signed   By: Macy Mis M.D.   On: 08/16/2019 16:34   ECHOCARDIOGRAM COMPLETE  Result Date: 08/13/2019   ECHOCARDIOGRAM REPORT   Patient Name:   ORR VISCOMI Date of Exam: 08/13/2019 Medical Rec #:  [id number]         Height:       69.0 in Accession #:    [id number]        Weight:       193.8 lb Date of Birth:  [birth year]         BSA:          2.04 m Patient Age:    48 years          BP:           92/66 mmHg Patient Gender: M                 HR:           97 bpm. Exam Location:  Inpatient Procedure: 2D Echo, Cardiac Doppler and Color Doppler Indications:    Cardiac Arrest 427.5  History:        Patient has no prior history of Echocardiogram examinations.  Sonographer:    Jonelle Sidle Dance Referring Phys: [id number] PAULA B SIMPSON  Sonographer Comments: Echo performed with patient supine and on artificial respirator. Image acquisition challenging due to respiratory motion. IMPRESSIONS  1. Left ventricular ejection fraction, by visual estimation, is 30 to 35%. The left ventricle has moderately decreased function. There is mildly increased left ventricular hypertrophy.  2. Left ventricular diastolic parameters are consistent with Grade I diastolic dysfunction (impaired relaxation).  3. The left ventricle demonstrates global hypokinesis.  4. Global right ventricle has mildly reduced systolic function.The right ventricular size is normal. No  increase in right ventricular wall thickness.  5. Left atrial size was normal.  6. Right atrial size was normal.  7. The pericardial effusion is circumferential.  8. Trivial pericardial effusion is present.  9. The mitral valve is normal in structure. No evidence of mitral valve regurgitation. 10. The tricuspid valve is normal in structure. 11. The tricuspid valve is normal in structure. Tricuspid valve regurgitation is trivial. 12. The aortic valve is normal in  structure. Aortic valve regurgitation is not visualized. 13. The pulmonic valve was grossly normal. Pulmonic valve regurgitation is not visualized. 14. Normal pulmonary artery systolic pressure. 15. The tricuspid regurgitant velocity is 2.32 m/s, and with an assumed right atrial pressure of 3 mmHg, the estimated right ventricular systolic pressure is normal at 24.5 mmHg. 16. The inferior vena cava is normal in size with greater than 50% respiratory variability, suggesting right atrial pressure of 3 mmHg. 17. No significant change from prior study. 18. Prior images reviewed side by side. FINDINGS  Left Ventricle: Left ventricular ejection fraction, by visual estimation, is 30 to 35%. The left ventricle has moderately decreased function. The left ventricle demonstrates global hypokinesis. The left ventricular internal cavity size was the left ventricle is normal in size. There is mildly increased left ventricular hypertrophy. Concentric left ventricular hypertrophy. Left ventricular diastolic parameters are consistent with Grade I diastolic dysfunction (impaired relaxation). Right Ventricle: The right ventricular size is normal. No increase in right ventricular wall thickness. Global RV systolic function is has mildly reduced systolic function. The tricuspid regurgitant velocity is 2.32 m/s, and with an assumed right atrial pressure of 3 mmHg, the estimated right ventricular systolic pressure is normal at 24.5 mmHg. Left Atrium: Left atrial size was normal in size. Right Atrium: Right atrial size was normal in size Pericardium: Trivial pericardial effusion is present. The pericardial effusion is circumferential. Mitral Valve: The mitral valve is normal in structure. No evidence of mitral valve regurgitation. Tricuspid Valve: The tricuspid valve is normal in structure. Tricuspid valve regurgitation is trivial. Aortic Valve: The aortic valve is normal in structure. Aortic valve regurgitation is not visualized. Pulmonic  Valve: The pulmonic valve was grossly normal. Pulmonic valve regurgitation is not visualized. Pulmonic regurgitation is not visualized. Aorta: The aortic root and ascending aorta are structurally normal, with no evidence of dilitation. Venous: The inferior vena cava is normal in size with greater than 50% respiratory variability, suggesting right atrial pressure of 3 mmHg. IAS/Shunts: No atrial level shunt detected by color flow Doppler. Additional Comments: Little change from 06/08/2019 (note different MRN).  LEFT VENTRICLE PLAX 2D LVIDd:         4.20 cm  Diastology LVIDs:         3.90 cm  LV e' lateral:   5.78 cm/s LV PW:         1.30 cm  LV E/e' lateral: 7.5 LV IVS:        1.20 cm  LV e' medial:    5.78 cm/s LVOT diam:     2.10 cm  LV E/e' medial:  7.5 LV SV:         13 ml LV SV Index:   6.06 LVOT Area:     3.46 cm  RIGHT VENTRICLE            IVC RV Basal diam:  2.10 cm    IVC diam: 1.40 cm RV S prime:     8.24 cm/s TAPSE (M-mode): 1.6 cm LEFT ATRIUM  Index       RIGHT ATRIUM          Index LA diam:      2.70 cm 1.32 cm/m  RA Area:     9.52 cm LA Vol (A4C): 26.9 ml 13.20 ml/m RA Volume:   18.20 ml 8.93 ml/m  AORTIC VALVE LVOT Vmax:   80.20 cm/s LVOT Vmean:  53.900 cm/s LVOT VTI:    0.126 m  AORTA Ao Root diam: 3.50 cm Ao Asc diam:  2.50 cm MITRAL VALVE                        TRICUSPID VALVE MV Area (PHT): 3.53 cm             TR Peak grad:   21.5 mmHg MV PHT:        62.35 msec           TR Vmax:        232.00 cm/s MV Decel Time: 215 msec MV E velocity: 43.30 cm/s 103 cm/s  SHUNTS MV A velocity: 59.60 cm/s 70.3 cm/s Systemic VTI:  0.13 m MV E/A ratio:  0.73       1.5       Systemic Diam: 2.10 cm  Dani Gobble Croitoru MD Electronically signed by Sanda Klein MD Signature Date/Time: 08/13/2019/2:38:10 PM    Final    VAS [place] LOWER EXTREMITY VENOUS (DVT)  Result Date: 08/24/2019  Lower Venous DVT Study Indications: Edema.  Risk Factors: Surgery L BKA. Limitations: Body habitus, poor ultrasound/tissue  interface, bandages, open wound and patient pain tolerance, patient positioning, patient immobility. Comparison Study: No prior studies. Performing Technologist: Oliver Hum RVT  Examination Guidelines: A complete evaluation includes B-mode imaging, spectral Doppler, color Doppler, and power Doppler as needed of all accessible portions of each vessel. Bilateral testing is considered an integral part of a complete examination. Limited examinations for reoccurring indications may be performed as noted. The reflux portion of the exam is performed with the patient in reverse Trendelenburg.  +---------+---------------+---------+-----------+----------+--------------+ RIGHT    CompressibilityPhasicitySpontaneityPropertiesThrombus Aging +---------+---------------+---------+-----------+----------+--------------+ CFV      Full           No       No                                  +---------+---------------+---------+-----------+----------+--------------+ SFJ      Full                                                        +---------+---------------+---------+-----------+----------+--------------+ FV Prox  Full                                                        +---------+---------------+---------+-----------+----------+--------------+ FV Mid   Full                                                        +---------+---------------+---------+-----------+----------+--------------+  FV DistalFull                                                        +---------+---------------+---------+-----------+----------+--------------+ PFV      Full                                                        +---------+---------------+---------+-----------+----------+--------------+ POP      Full           No       No                                  +---------+---------------+---------+-----------+----------+--------------+ PTV      Full                                                         +---------+---------------+---------+-----------+----------+--------------+ PERO     Full                                                        +---------+---------------+---------+-----------+----------+--------------+ Arterial flow detected in the popliteal, posterior tibial, peroneal, and anterior tibial arteries demonstrate multiphasic waveforms.  +-------+---------------+---------+-----------+----------+--------------+ LEFT   CompressibilityPhasicitySpontaneityPropertiesThrombus Aging +-------+---------------+---------+-----------+----------+--------------+ CFV    None           No       No                   Acute          +-------+---------------+---------+-----------+----------+--------------+ FV ProxNone           No       No                   Acute          +-------+---------------+---------+-----------+----------+--------------+ Unable to obtain images distal to the proximal femoral vein due to open wounds and bandages. Unable to obtain iliac images due to patient body habitus, edema, and positioning.    Summary: RIGHT: - There is no evidence of deep vein thrombosis in the lower extremity. However, portions of this examination were limited- see technologist comments above.  - No cystic structure found in the popliteal fossa.  LEFT: - Findings consistent with acute deep vein thrombosis involving the left common femoral vein, and left femoral vein. - No cystic structure found in the popliteal fossa.  *See table(s) above for measurements and observations. Electronically signed by Deitra Mayo MD on 08/24/2019 at 3:09:08 PM.    Final    [place] EKG SITE RITE  Result Date: 08/18/2019 If Site Rite image not attached, placement could not be confirmed due to current cardiac rhythm.   Micro Data:   Results for orders placed or performed during the hospital encounter of 08/09/2019  Respiratory Panel by  RT PCR (Flu A&B, Covid) - Nasopharyngeal Swab     Status: None    Collection Time: 07/28/2019  1:46 AM   Specimen: Nasopharyngeal Swab  Result Value Ref Range Status   SARS Coronavirus 2 by RT PCR NEGATIVE NEGATIVE Final    Comment: (NOTE) SARS-CoV-2 target nucleic acids are NOT DETECTED. The SARS-CoV-2 RNA is generally detectable in upper respiratoy specimens during the acute phase of infection. The lowest concentration of SARS-CoV-2 viral copies this assay can detect is 131 copies/mL. A negative result does not preclude SARS-Cov-2 infection and should not be used as the sole basis for treatment or other patient management decisions. A negative result may occur with  improper specimen collection/handling, submission of specimen other than nasopharyngeal swab, presence of viral mutation(s) within the areas targeted by this assay, and inadequate number of viral copies (<131 copies/mL). A negative result must be combined with clinical observations, patient history, and epidemiological information. The expected result is Negative. Fact Sheet for Patients:  PinkCheek.be Fact Sheet for Healthcare Providers:  GravelBags.it This test is not yet ap proved or cleared by the [place] FDA and  has been authorized for detection and/or diagnosis of SARS-CoV-2 by FDA under an Emergency Use Authorization (EUA). This EUA will remain  in effect (meaning this test can be used) for the duration of the COVID-19 declaration under Section 564(b)(1) of the Act, 21 U.S.C. section 360bbb-3(b)(1), unless the authorization is terminated or revoked sooner.    Influenza A by PCR NEGATIVE NEGATIVE Final   Influenza B by PCR NEGATIVE NEGATIVE Final    Comment: (NOTE) The Xpert Xpress SARS-CoV-2/FLU/RSV assay is intended as an aid in  the diagnosis of influenza from Nasopharyngeal swab specimens and  should not be used as a sole basis for treatment. Nasal washings and  aspirates are unacceptable for Xpert Xpress  SARS-CoV-2/FLU/RSV  testing. Fact Sheet for Patients: PinkCheek.be Fact Sheet for Healthcare Providers: GravelBags.it This test is not yet approved or cleared by the [place] FDA and  has been authorized for detection and/or diagnosis of SARS-CoV-2 by  FDA under an Emergency Use Authorization (EUA). This EUA will remain  in effect (meaning this test can be used) for the duration of the  Covid-19 declaration under Section 564(b)(1) of the Act, 21  U.S.C. section 360bbb-3(b)(1), unless the authorization is  terminated or revoked. Performed at [facility], [place] [address] [place], [place] [address]   MRSA PCR Screening     Status: Abnormal   Collection Time: 07/26/2019  7:35 AM   Specimen: Nasal Mucosa; Nasopharyngeal  Result Value Ref Range Status   MRSA by PCR POSITIVE (A) NEGATIVE Final    Comment:        The GeneXpert MRSA Assay (FDA approved for NASAL specimens only), is one component of a comprehensive MRSA colonization surveillance program. It is not intended to diagnose MRSA infection nor to guide or monitor treatment for MRSA infections. RESULT CALLED TO, READ BACK BY AND VERIFIED WITH: Mikal Plane RN 9:40 08/01/2019 ([email or web address]) Performed at [facility], [place] [address], [place], [place] [address]   Culture, respiratory (non-expectorated)     Status: None   Collection Time: 07/17/2019  7:38 AM   Specimen: Tracheal Aspirate; Respiratory  Result Value Ref Range Status   Specimen Description TRACHEAL ASPIRATE  Final   Special Requests NONE  Final   Gram Stain   Final    FEW WBC PRESENT, PREDOMINANTLY PMN MODERATE GRAM POSITIVE COCCI IN PAIRS  IN CLUSTERS MODERATE GRAM NEGATIVE RODS RARE GRAM NEGATIVE COCCOBACILLI Performed at [facility], [place] [address], [place], [place] [address]    Culture   Final    MODERATE METHICILLIN RESISTANT STAPHYLOCOCCUS AUREUS   Report Status 08/15/2019 FINAL  Final    Organism ID, Bacteria METHICILLIN RESISTANT STAPHYLOCOCCUS AUREUS  Final      Susceptibility   Methicillin resistant staphylococcus aureus - MIC*    CIPROFLOXACIN >=8 RESISTANT Resistant     ERYTHROMYCIN >=8 RESISTANT Resistant     GENTAMICIN <=0.5 SENSITIVE Sensitive     OXACILLIN >=4 RESISTANT Resistant     TETRACYCLINE <=1 SENSITIVE Sensitive     VANCOMYCIN 1 SENSITIVE Sensitive     TRIMETH/SULFA <=10 SENSITIVE Sensitive     CLINDAMYCIN <=0.25 SENSITIVE Sensitive     RIFAMPIN <=0.5 SENSITIVE Sensitive     Inducible Clindamycin NEGATIVE Sensitive     * MODERATE METHICILLIN RESISTANT STAPHYLOCOCCUS AUREUS  Culture, blood (routine x 2)     Status: None   Collection Time: 08/15/2019  9:46 AM   Specimen: BLOOD RIGHT ARM  Result Value Ref Range Status   Specimen Description BLOOD RIGHT ARM  Final   Special Requests   Final    AEROBIC BOTTLE ONLY Blood Culture results may not be optimal due to an inadequate volume of blood received in culture bottles Performed at [facility], [address] [place], [place] [address]    Culture NO GROWTH 5 DAYS  Final   Report Status 08/17/2019 FINAL  Final  Culture, blood (routine x 2)     Status: None   Collection Time: 08/05/2019  9:47 AM   Specimen: BLOOD  Result Value Ref Range Status   Specimen Description BLOOD RIGHT ANTECUBITAL  Final   Special Requests   Final    AEROBIC BOTTLE ONLY Blood Culture results may not be optimal due to an inadequate volume of blood received in culture bottles Performed at [facility], [place] [address] [place], [place] [address]    Culture NO GROWTH 5 DAYS  Final   Report Status 08/17/2019 FINAL  Final    Antimicrobials:   Antibiotics Given (last 72 hours)    Date/Time Action Medication Dose Rate   08/22/19 1811 New Bag/Given   vancomycin (VANCOCIN) IVPB 1000 mg/200 mL premix 1,000 mg 200 mL/hr   08/22/19 2053 New Bag/Given   ceFEPIme (MAXIPIME) 2 g in sodium chloride 0.9 % 100 mL IVPB 2 g 200  mL/hr   08/23/19 1124 New Bag/Given  [last hour of HD]   vancomycin (VANCOREADY) IVPB 750 mg/150 mL 750 mg 150 mL/hr   08/23/19 1818 New Bag/Given   ceFEPIme (MAXIPIME) 1 g in sodium chloride 0.9 % 100 mL IVPB 1 g 200 mL/hr   08/24/19 1653 New Bag/Given   ceFEPIme (MAXIPIME) 1 g in sodium chloride 0.9 % 100 mL IVPB 1 g 200 mL/hr      Interim history/subjective:  Had some pain overnight, received Dilaudid around 9pm and Tylenol 650 around 7am. Vitals stable ON (patient remains on Levophed about 8 mcg/min), afebrile  Objective   Blood pressure (!) 84/43, pulse 97, temperature 98.7 F (37.1 C), temperature source Oral, resp. rate 19, height 5\' 9"  (1.753 m), weight 93.4 kg, SpO2 99 %.        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  Right lower extremity with 2+ pitting edema, cutaneous abrasions appreciated to patient's thigh, shin, and calf Neuro: Cranial nerves II-XII grossly intact, patient has no sensation to right foot or toes; no sensation to left BKA stump  Resolved Hospital Problem list   Cardiac arrest Prolonged QTC Acute hypoxic respiratory failure MRSA pneumonia Acute encephalopathy  Assessment & Plan:  Multifactorial Shock, likely cardiogenic with  superimposed septic shock, unchanged: Mr. Nienow is clinically improved, more aware and awake, however continues to have persistent vasopressor requirement. CT L Femur (2/7) showed heavily calcified vessels especially of the superficial femoral artery, was negative for abscess or osteomyelitis. 2/10 ABIs not performed, however Doppler of LEs revealed 2 DVTs to LLE.  Patient afebrile ON, vitals stable on ~8 mcg/min Levophed, WBC acutely improved 16>15.7 (2/10). Received Albumin 2/8. The intervention needed to address patient's calciphylaxis or limb necrosis may be too invasive for this patient, who continues to require pressors to maintain stable blood pressure. We plan to gather information from specialists (orthopedics, plastic surgery), present information to patient for him to make informed decision as to the direction of his care.  Patient most recently stated he would like to "do whatever is needed to get him better so he can go home".  May need to consider palliative. - Wean norepinephrine as tolerated - Midodrine 10mg  TID - Consulted Orthopedics for input - appreciate recs - Consulted Plastic Surgery/Wound Care - appreciate recs - Remains on Vancomycin and Cefepime (started 2/7-) - follow CBC   LE DVT: Doppler ultrasound of bilateral lower extremities 2/9 revealed DVTs of the left common femoral vein and left femoral vein.  Patient had not been on DVT prophylaxis due to anemia - Pharmacy consulted, patient started on heparin - Follow-up 8-hour heparin level  - signs/symptoms of bleeding - Follow CBC  Calciphylaxis, chronic, stable:  Patientdenies significant pain from his calciphylaxis. Strong suspicion is secondary to patient's comorbidities that he lacks proper sensation to his lower extremities.  Adequate pain controlwith Dilaudid, gabapentin. -Continue gabapentin200mg  BID - Cymbalta 20 mg daily - Dilaudid 4mg  q6 made as needed (only using 1-2 daily) - Continue Sodium Thiosulfate  25mg  IV qMWF with hemodialysis  ESRDon HD MWF: HD today 2/10. Last had HD M 2/8, was diuresed 3 L. - Nephrology consulted - appreciate recommendations  Anemia of Chronic Disease, chronic, stable:Hgb 7.3>7.4 on 2/9. Transfused 2 units RBC 01/28-01/29, transfused 2 more units RBC on 08/17/2019 - follow AM CBC -Transfuse Hgb <7 with RBC. - Aranesp IV 130mcg every Saturday  Type 2Diabetes Mellitus:Blood sugar readings more stable 125-174 over the last 24 hours. No occurrences of hypoglycemia. - mSSI  - Levemir 8 units BID - CBGs q4  Best practice:  Diet: Renal/carb modified with fluid restriction Pain/Anxiety/Delirium protocol (if indicated): Dilaudid 4 mg every 6 as needed, Cymbalta 20 mg daily, gabapentin 200 mg nightly VAP protocol (if indicated): None DVT prophylaxis: Heparin GI prophylaxis: None at this time Glucose control: Levemir  8 units twice daily, moderate sliding scale insulin Mobility: Limited secondary to pain/BKA Code Status: DNR Family Communication: None Disposition: Transition to floor once stable  Labs   CBC: Recent Labs  Lab 08/22/19 0427 08/22/19 0946 08/23/19 0202 08/24/19 0240 08/25/19 0541  WBC 13.1* 16.6* 14.8* 16.0* 15.7*  HGB 7.6* 7.8* 7.5* 7.3* 7.4*  HCT 25.9* 26.1* 25.6* 23.8* 24.6*  MCV 92.2 91.6 92.4 89.8 89.5  PLT 233 260 261 292 [id number]    Basic Metabolic Panel: Recent Labs  Lab 08/19/19 0500 08/19/19 0500 08/19/19 1806 08/19/19 1806 08/19/19 2228 08/19/19 2228 08/20/19 0345 08/20/19 0730 08/20/19 0911 08/21/19 0443 08/21/19 1125 08/21/19 2230 08/22/19 0427 08/22/19 0946 08/22/19 2300 08/23/19 0202 08/23/19 0202 08/23/19 1349 08/23/19 2300 08/24/19 0232 08/24/19 0240 08/24/19 1121 08/24/19 2314  NA 144   < > 137   < > 139   < >  --  139  --  140  --   --   --  140  --  136  --   --   --   --  139  --   --   K 3.9   < > 4.3   < > 4.0   < >  --  4.2  --  4.2  --   --   --  4.6  --  4.5  --   --   --   --  4.6  --    --   CL 105   < > 100   < > 102   < >  --  100  --  102  --   --   --  99  --  93*  --   --   --   --  95*  --   --   CO2 20*   < > 18*   < > 17*   < >  --  17*  --  19*  --   --   --  18*  --  20*  --   --   --   --  18*  --   --   GLUCOSE 56*   < > 168*   < > 160*   < >  --  133*   < > 123*   < >   < >  --  153*   < > 139*   < > 174* 125*  --  154* 132* 129*  BUN 40*   < > 50*   < > 50*   < >  --  57*  --  42*  --   --   --  64*  --  75*  --   --   --   --  78*  --   --   CREATININE 2.92*   < > 3.66*   < > 3.77*   < >  --  3.87*  --  3.62*  --   --   --  5.19*  --  5.92*  --   --   --   --  5.96*  --   --   CALCIUM 8.0*  7.4*   < > 7.4*   < > 7.5*   < >  --  8.0*  --  7.4*  --   --   --  7.7*  --  7.6*  --   --   --   --  8.0*  --   --  MG 2.6*   < >  --   --  2.5*  --  2.4  --   --  2.2  --   --  2.4  --   --   --   --   --   --  2.3  --   --   --   PHOS 3.5  --  3.9  --   --   --   --  4.7*  --   --   --   --   --  4.7*  --  4.8*  --   --   --   --   --   --   --    < > = values in this interval not displayed.   GFR: Estimated Creatinine Clearance: 15.8 mL/min (A) (by C-G formula based on SCr of 5.96 mg/dL (H)). Recent Labs  Lab 08/22/19 0946 08/23/19 0202 08/24/19 0240 08/25/19 0541  WBC 16.6* 14.8* 16.0* 15.7*    Liver Function Tests: Recent Labs  Lab 08/19/19 1806 08/20/19 0730 08/22/19 0946 08/23/19 0202 08/24/19 0240  AST  --   --   --   --  15  ALT  --   --   --   --  20  ALKPHOS  --   --   --   --  127*  BILITOT  --   --   --   --  0.6  PROT  --   --   --   --  5.9*  ALBUMIN 1.7* 2.0* 2.1* 2.0* 1.9*   No results for input(s): LIPASE, AMYLASE in the last 168 hours. No results for input(s): AMMONIA in the last 168 hours.  ABG    Component Value Date/Time   PHART 7.422 08/16/2019 1520   PCO2ART 33.7 08/16/2019 1520   PO2ART 177.0 (H) 08/16/2019 1520   HCO3 21.9 08/16/2019 1520   TCO2 23 08/16/2019 1520   ACIDBASEDEF 2.0 08/16/2019 1520   O2SAT 100.0  08/16/2019 1520     Coagulation Profile: No results for input(s): INR, PROTIME in the last 168 hours.  Cardiac Enzymes: No results for input(s): CKTOTAL, CKMB, CKMBINDEX, TROPONINI in the last 168 hours.  HbA1C: Hgb A1c MFr Bld  Date/Time Value Ref Range Status  08/05/2019 07:30 PM 5.6 4.8 - 5.6 % Final    Comment:    (NOTE) Pre diabetes:          5.7%-6.4% Diabetes:              >6.4% Glycemic control for   <7.0% adults with diabetes     CBG: Recent Labs  Lab 08/24/19 1114 08/24/19 1641 08/24/19 2010 08/24/19 2345 08/25/19 0129  GLUCAP 120* 132* 125* 125* 98    Review of Systems:   See HPI  Past Medical History  He,  has a past medical history of Anemia due to chronic kidney disease, Cardiac arrest ([place]) (08/01/2019), End-stage renal disease on hemodialysis ([place]), and Personal history of MRSA (methicillin resistant Staphylococcus aureus) (07/2020).   Surgical History    Past Surgical History:  Procedure Laterality Date  . IR FLUORO GUIDE CV LINE LEFT  08/19/2019  . IR US GUIDE VASC ACCESS LEFT  08/19/2019     Social History   reports that he has been smoking cigarettes. He has never used smokeless tobacco. He reports current alcohol use. He reports current drug use. Drug: Marijuana.   Family History   His family history is not on file.  Allergies Allergies  Allergen Reactions  . Amlodipine Itching  . Dacarbazine And Related Nausea And Vomiting  . Fentanyl Itching  . Hydrocodone Nausea And Vomiting  . Propofol Itching     Home Medications  Prior to Admission medications   Medication Sig Start Date End Date Taking? Authorizing Provider  b complex-vitamin c-folic acid (NEPHRO-VITE) 0.8 MG TABS tablet Take 1 tablet by mouth daily. 05/18/19   [provider]  calcium acetate (PHOSLO) 667 MG tablet Take 1,334 mg by mouth 3 (three) times daily. 07/21/19   [provider]  COLCRYS 0.6 MG tablet Take 0.6 mg by mouth daily as needed (gout flare).   03/24/19   [provider]  hydrALAZINE (APRESOLINE) 25 MG tablet Take 37.5 mg by mouth 3 (three) times daily. 03/29/19   [provider]  isosorbide mononitrate (IMDUR) 30 MG 24 hr tablet Take 30 mg by mouth daily. 03/29/19   [provider]  LEVEMIR FLEXTOUCH 100 UNIT/ML Pen Inject 25 Units into the skin daily. 07/30/19   [provider]  metoprolol succinate (TOPROL-XL) 50 MG 24 hr tablet Take 50 mg by mouth daily. 03/29/19   [provider]  NOVOLIN 70/30 FLEXPEN (70-30) 100 UNIT/ML PEN Inject 20 Units into the skin daily. 03/11/19   [provider]  NOVOLOG FLEXPEN 100 UNIT/ML FlexPen Inject 1 Units into the skin at bedtime. 07/30/19   [provider]  oxyCODONE-acetaminophen (PERCOCET) 7.5-325 MG tablet Take 1 tablet by mouth every 4 (four) hours as needed. 07/26/19   [provider]  oxyCODONE-acetaminophen (PERCOCET/ROXICET) 5-325 MG tablet Take 1 tablet by mouth 4 (four) times daily as needed. 07/19/19   [provider]  pantoprazole (PROTONIX) 40 MG tablet Take 40 mg by mouth daily. 06/23/19   [provider]  RENVELA 800 MG tablet Take 2,400 mg by mouth 3 (three) times daily. 03/11/19   [provider]  rosuvastatin (CRESTOR) 5 MG tablet Take 5 mg by mouth daily. 03/11/19   [provider]  TRULICITY [id number] SOPN Inject 1 mg into the skin once a week. 07/30/19   [provider]  VELPHORO 500 MG chewable tablet Chew 500 mg by mouth 3 (three) times daily. 06/08/19   [provider]  VENTOLIN HFA 108 (90 Base) MCG/ACT inhaler Inhale 2 puffs into the lungs 4 (four) times daily as needed for wheezing or shortness of breath. 06/23/19   [provider]     Critical care time: Embden, Dierks, PGY-2 08/25/2019 7:38 AM

## 2019-08-25 NOTE — Consult Note (Signed)
Reason for Consult: Leg wounds Referring Physician: Dr. Kipp Brood  [name] is an 54 y.o. male.  HPI: The patient is a 54 yrs old bm here for treatment for cardiac disease and sepsis.  He was diagnosed with pneumonia and had a cardiac event 1/28 requiring intubation until 2/3.  He is in the Heart ICU and is awake and alert.  He seems to have a good understanding of the complexity of his condition.  He has ESRD is on hemodialysis as able with his hypotension.  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  He is getting xeroform dressing changes at present.  He has developed a wound on the sacral area.  He is on multiple meds including vasopressors and insulin. Recent U/S showed positive for DVT in left leg according to primary team.  There is little to no drainage for the leg.  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  Past Medical History:  Diagnosis Date  . Anemia due to chronic kidney disease   . Cardiac arrest ([place]) 07/27/2019  . End-stage renal disease on hemodialysis ([place])   . Personal history of MRSA (methicillin resistant Staphylococcus aureus) 07/2020   pcr positive    Past Surgical History:  Procedure Laterality Date  . IR FLUORO GUIDE CV LINE LEFT  08/19/2019  . IR US GUIDE VASC ACCESS LEFT  08/19/2019    History reviewed. No pertinent family history.  Social History:  reports that he has been smoking cigarettes. He has never used smokeless tobacco. He reports current alcohol use. He reports current drug use. Drug: Marijuana.  Allergies:  Allergies  Allergen Reactions  . Amlodipine Itching  . Dacarbazine And Related Nausea And Vomiting  . Fentanyl Itching  . Hydrocodone Nausea And Vomiting  . Propofol Itching    Medications: I have reviewed the patient's current medications.  Results for orders placed or performed during the hospital encounter of 07/17/2019 (from the past 48 hour(s))  Glucose, capillary      Status: Abnormal   Collection Time: 08/23/19 11:34 AM  Result Value Ref Range   Glucose-Capillary 39 (LL) 70 - 99 mg/dL   Comment 1 Repeat Test    Comment 2 Call MD NNP PA CNM   Glucose, capillary     Status: None   Collection Time: 08/23/19 11:59 AM  Result Value Ref Range   Glucose-Capillary 70 70 - 99 mg/dL  Glucose, random     Status: Abnormal   Collection Time: 08/23/19  1:49 PM  Result Value Ref Range   Glucose, Bld 174 (H) 70 - 99 mg/dL    Comment: Performed at [facility] [address] [place], [place] [address]  Glucose, capillary     Status: Abnormal   Collection Time: 08/23/19  2:43 PM  Result Value Ref Range   Glucose-Capillary 142 (H) 70 - 99 mg/dL  Glucose, capillary     Status: Abnormal   Collection Time: 08/23/19  7:56 PM  Result Value Ref Range   Glucose-Capillary 173 (H) 70 - 99 mg/dL  Glucose, random     Status: Abnormal   Collection Time: 08/23/19 11:00 PM  Result Value Ref Range   Glucose, Bld 125 (H) 70 - 99 mg/dL    Comment: Performed at [facility] [address], [place], [place] [address]  Glucose, capillary     Status: Abnormal   Collection Time: 08/24/19  1:24 AM  Result Value  Ref Range   Glucose-Capillary 174 (H) 70 - 99 mg/dL  Magnesium     Status: None   Collection Time: 08/24/19  2:32 AM  Result Value Ref Range   Magnesium 2.3 1.7 - 2.4 mg/dL    Comment: Performed at [facility], [place] [address], [place], [place] [address]  CBC     Status: Abnormal   Collection Time: 08/24/19  2:40 AM  Result Value Ref Range   WBC 16.0 (H) 4.0 - 10.5 K/uL   RBC 2.65 (L) 4.22 - 5.81 MIL/uL   Hemoglobin 7.3 (L) 13.0 - 17.0 g/dL   HCT 23.8 (L) 39.0 - 52.0 %   MCV 89.8 80.0 - 100.0 fL   MCH 27.5 26.0 - 34.0 pg   MCHC 30.7 30.0 - 36.0 g/dL   RDW 19.9 (H) 11.5 - 15.5 %   Platelets 292 150 - 400 K/uL   nRBC 0.0 0.0 - 0.2 %    Comment: Performed at [facility], [place] [address] [place], [place] [address]  Comprehensive  metabolic panel     Status: Abnormal   Collection Time: 08/24/19  2:40 AM  Result Value Ref Range   Sodium 139 135 - 145 mmol/L   Potassium 4.6 3.5 - 5.1 mmol/L   Chloride 95 (L) 98 - 111 mmol/L   CO2 18 (L) 22 - 32 mmol/L   Glucose, Bld 154 (H) 70 - 99 mg/dL   BUN 78 (H) 6 - 20 mg/dL   Creatinine, Ser 5.96 (H) 0.61 - 1.24 mg/dL   Calcium 8.0 (L) 8.9 - 10.3 mg/dL   Total Protein 5.9 (L) 6.5 - 8.1 g/dL   Albumin 1.9 (L) 3.5 - 5.0 g/dL   AST 15 15 - 41 U/L   ALT 20 0 - 44 U/L   Alkaline Phosphatase 127 (H) 38 - 126 U/L   Total Bilirubin 0.6 0.3 - 1.2 mg/dL   GFR calc non Af Amer 10 (L) >60 mL/min   GFR calc Af Amer 11 (L) >60 mL/min   Anion gap 26 (H) 5 - 15    Comment: Performed at [facility], [place] [address], [place], [place] [address]  Glucose, capillary     Status: Abnormal   Collection Time: 08/24/19  7:56 AM  Result Value Ref Range   Glucose-Capillary 102 (H) 70 - 99 mg/dL  Glucose, capillary     Status: Abnormal   Collection Time: 08/24/19 11:14 AM  Result Value Ref Range   Glucose-Capillary 120 (H) 70 - 99 mg/dL  Glucose, random     Status: Abnormal   Collection Time: 08/24/19 11:21 AM  Result Value Ref Range   Glucose, Bld 132 (H) 70 - 99 mg/dL    Comment: Performed at [facility] [address], [place], [place] [address]  Glucose, capillary     Status: Abnormal   Collection Time: 08/24/19  4:41 PM  Result Value Ref Range   Glucose-Capillary 132 (H) 70 - 99 mg/dL  Glucose, capillary     Status: Abnormal   Collection Time: 08/24/19  8:10 PM  Result Value Ref Range   Glucose-Capillary 125 (H) 70 - 99 mg/dL  Glucose, random     Status: Abnormal   Collection Time: 08/24/19 11:14 PM  Result Value Ref Range   Glucose, Bld 129 (H) 70 - 99 mg/dL    Comment: Performed at [facility] [address], [place], [place] [address]  Glucose, capillary  Status: Abnormal   Collection Time: 08/24/19 11:45 PM  Result Value Ref Range   Glucose-Capillary  125 (H) 70 - 99 mg/dL  Heparin level (unfractionated)     Status: Abnormal   Collection Time: 08/25/19  1:10 AM  Result Value Ref Range   Heparin Unfractionated <0.10 (L) 0.30 - 0.70 IU/mL    Comment: (NOTE) If heparin results are below expected values, and patient dosage has  been confirmed, suggest follow up testing of antithrombin III levels. Performed at [facility], [place] [address], [place], [place] [address]   Glucose, capillary     Status: None   Collection Time: 08/25/19  1:29 AM  Result Value Ref Range   Glucose-Capillary 98 70 - 99 mg/dL  CBC     Status: Abnormal   Collection Time: 08/25/19  5:41 AM  Result Value Ref Range   WBC 15.7 (H) 4.0 - 10.5 K/uL   RBC 2.75 (L) 4.22 - 5.81 MIL/uL   Hemoglobin 7.4 (L) 13.0 - 17.0 g/dL   HCT 24.6 (L) 39.0 - 52.0 %   MCV 89.5 80.0 - 100.0 fL   MCH 26.9 26.0 - 34.0 pg   MCHC 30.1 30.0 - 36.0 g/dL   RDW 19.6 (H) 11.5 - 15.5 %   Platelets 368 150 - 400 K/uL   nRBC 0.0 0.0 - 0.2 %    Comment: Performed at [facility], [place] [address], [place], [place] [address]  Glucose, capillary     Status: Abnormal   Collection Time: 08/25/19  8:10 AM  Result Value Ref Range   Glucose-Capillary 137 (H) 70 - 99 mg/dL    VAS [place] LOWER EXTREMITY VENOUS (DVT)  Result Date: 08/24/2019  Lower Venous DVT Study Indications: Edema.  Risk Factors: Surgery L BKA. Limitations: Body habitus, poor ultrasound/tissue interface, bandages, open wound and patient pain tolerance, patient positioning, patient immobility. Comparison Study: No prior studies. Performing Technologist: Oliver Hum RVT  Examination Guidelines: A complete evaluation includes B-mode imaging, spectral Doppler, color Doppler, and power Doppler as needed of all accessible portions of each vessel. Bilateral testing is considered an integral part of a complete examination. Limited examinations for reoccurring indications may be performed as noted. The reflux portion of the exam is  performed with the patient in reverse Trendelenburg.  +---------+---------------+---------+-----------+----------+--------------+ RIGHT    CompressibilityPhasicitySpontaneityPropertiesThrombus Aging +---------+---------------+---------+-----------+----------+--------------+ CFV      Full           No       No                                  +---------+---------------+---------+-----------+----------+--------------+ SFJ      Full                                                        +---------+---------------+---------+-----------+----------+--------------+ FV Prox  Full                                                        +---------+---------------+---------+-----------+----------+--------------+ FV Mid   Full                                                        +---------+---------------+---------+-----------+----------+--------------+  FV DistalFull                                                        +---------+---------------+---------+-----------+----------+--------------+ PFV      Full                                                        +---------+---------------+---------+-----------+----------+--------------+ POP      Full           No       No                                  +---------+---------------+---------+-----------+----------+--------------+ PTV      Full                                                        +---------+---------------+---------+-----------+----------+--------------+ PERO     Full                                                        +---------+---------------+---------+-----------+----------+--------------+ Arterial flow detected in the popliteal, posterior tibial, peroneal, and anterior tibial arteries demonstrate multiphasic waveforms.  +-------+---------------+---------+-----------+----------+--------------+ LEFT   CompressibilityPhasicitySpontaneityPropertiesThrombus Aging  +-------+---------------+---------+-----------+----------+--------------+ CFV    None           No       No                   Acute          +-------+---------------+---------+-----------+----------+--------------+ FV ProxNone           No       No                   Acute          +-------+---------------+---------+-----------+----------+--------------+ Unable to obtain images distal to the proximal femoral vein due to open wounds and bandages. Unable to obtain iliac images due to patient body habitus, edema, and positioning.    Summary: RIGHT: - There is no evidence of deep vein thrombosis in the lower extremity. However, portions of this examination were limited- see technologist comments above.  - No cystic structure found in the popliteal fossa.  LEFT: - Findings consistent with acute deep vein thrombosis involving the left common femoral vein, and left femoral vein. - No cystic structure found in the popliteal fossa.  *See table(s) above for measurements and observations. Electronically signed by Deitra Mayo MD on 08/24/2019 at 3:09:08 PM.    Final     Review of Systems  Constitutional: Positive for activity change. Negative for chills.   Blood pressure (!) 84/43, pulse 97, temperature 97.8 F (36.6 C), temperature source Oral, resp. rate 19, height 5\' 9"  (1.753 m), weight 93.4 kg, SpO2 99 %. Physical Exam  Nursing note and vitals reviewed. Constitutional: He is oriented to person, place, and time. He appears well-developed.  HENT:  Head: Normocephalic and atraumatic.  Eyes: EOM are normal.  Cardiovascular: Normal rate.  Respiratory: Effort normal.  GI: Soft. He exhibits no distension.  Musculoskeletal:        General: Tenderness, deformity and edema present.  Neurological: He is alert and oriented to person, place, and time.  Skin: Skin is warm. There is erythema.  Psychiatric: He has a normal mood and affect. His behavior is normal.    Assessment/Plan: Severe  calciphylaxis of left leg. Recommend continue dressing care with the xeroform.  Maximize nutritional status.  Call with any worsening of the condition of the leg.  At this point agree with local care.  I am very concerned the patient would not tolerate any aggressive treatment or anesthesia in his current condition. I have personally reviewed the CT scan, spoken with Dr. Sharol Given and Dr. Kristine Royal S Nastasia Kage 08/25/2019, 9:43 AM

## 2019-08-25 NOTE — Progress Notes (Signed)
[facility] for heparin Indication: DVT  Allergies  Allergen Reactions  . Amlodipine Itching  . Dacarbazine And Related Nausea And Vomiting  . Fentanyl Itching  . Hydrocodone Nausea And Vomiting  . Propofol Itching    Patient Measurements: Height: 5\' 9"  (175.3 cm) Weight: 205 lb 11 oz (93.3 kg) IBW/kg (Calculated) : 70.7 Heparin Dosing Weight: 88kg  Vital Signs: Temp: 99.6 F (37.6 C) (02/10 0000) Temp Source: Oral (02/10 0000) BP: 105/29 (02/10 0112)  Labs: Recent Labs    08/22/19 0946 08/22/19 0946 08/23/19 0202 08/24/19 0240 08/25/19 0110  HGB 7.8*   < > 7.5* 7.3*  --   HCT 26.1*  --  25.6* 23.8*  --   PLT 260  --  261 292  --   HEPARINUNFRC  --   --   --   --  <0.10*  CREATININE 5.19*  --  5.92* 5.96*  --    < > = values in this interval not displayed.    Estimated Creatinine Clearance: 15.8 mL/min (A) (by C-G formula based on SCr of 5.96 mg/dL (H)).  Assessment: 54 year old male with newly diagnosed DVT in left femoral vein. Orders to start IV heparin, has not been on dvt px due to anemia. No over bleeding noted.  Heparin level undetectable on gtt at 1250 units/hr. No issues with line or bleeding reported per RN. RN did draw level from central line so paused heparin x 5 min then flushed.  Goal of Therapy:  Heparin level 0.3-0.7 units/ml Monitor platelets by anticoagulation protocol: Yes   Plan:  Rebolus heparin 2500 units Increase heparin gtt to 1550 units/hr Will f/u 8 hr heparin level F/u s/s bleeding and Hgb closely  Sherlon Handing, PharmD, BCPS Please see amion for complete clinical pharmacist phone list 08/25/2019 1:55 AM

## 2019-08-25 NOTE — Progress Notes (Signed)
[name] Progress Note  Subjective: no new c/o's, wants to get OOB.  Seen by ortho, pt said did not want any more surgery, see their notes.    Vitals:   08/25/19 0500 08/25/19 0540 08/25/19 0600 08/25/19 0806  BP:  (!) 92/56 (!) 84/43   Pulse:      Resp: 19 (!) 21 19   Temp:    97.8 F (36.6 C)  TempSrc:    Oral  SpO2:  99%    Weight:   93.4 kg   Height:        Exam: General: awake, alert, comfortable HEENT: MMM Eyes: PERRL Cor: RRR no RG Lungs: Coarse BS, normal WOB Abdomen: Soft obese Extremities: 2+ RLE edema  Skin: large necrotic wound left lateral upper leg w/ sig tissue loss; distal L BKA stump tissue necrotic also gangrene of the penis Neuro: responding to conversation appropriately.  Access right femoral TDC in place with dressing c/d/i  [facility] MWF   R fem TDC   450/800  2/2 bath  86kg  Hep 6K + 1K mid -mircera 200 mcg every 2 weeks last on 1/16 -Sodium thiosulfate 25 gram every treatment  -Weekly venofer -parsabiv 2.5 mg three times a week    CXR 1/28 - no active disease  Assessment/Plan:  # Cardiac arrest/ found down - Supportive care per pulm critical care--> improved  # Acute hypoxic respiratory failure - resolved  # End-stage renal disease - sp CRRT 1/28 > 08/18/19 -  MWF schedule  -  midodrine 10 TID for soft BP's - requiring low dose pressors still - vol up about 7kg w/ LE edema, no resp issues - HD today , UF as tolerated  # Shock - d/t sepsis,  MRSA growing resp cx, bcx's negative on IV abx - Improved  -Completed course of vanc 2/5  # Anemia acute on chronic - Status post PRBCs - last mircera was on 1/30 at 150 mcg -Hb 7-8s now and intermittent transfusions have been required..   # Acute encephalopathy -  s/p arrest,  also with report of seizure activity and s/p midazolam  - improved  # Calciphylaxis - major wound L lat thigh, also penile gangrene - cont tiw sodium thiosulfate - ortho and plastics have  consulted, pt told ortho he doesn't want surgery, wants to be "comfortable", tired of fighting, etc..., see their notes; will likely need pall care assistance  # Secondary hyperparathyroidism  - No activated vit D or Ca++ products with calciphylaxis - Resumed sensipar 30 daily 2/3 here parsabiv not available; Corr ca ok this AM, CTM.  Kelly Splinter, MD 08/25/2019, 10:51 AM        Inpatient medications: . Chlorhexidine Gluconate Cloth  6 each Topical Q0600  . cinacalcet  30 mg Oral Q breakfast  . darbepoetin (ARANESP) injection - DIALYSIS  150 mcg Intravenous Q Sat-HD  . DULoxetine  20 mg Oral Daily  . feeding supplement (NEPRO CARB STEADY)  237 mL Oral TID BM  . feeding supplement (PRO-STAT SUGAR FREE 64)  30 mL Oral BID  . gabapentin  200 mg Oral QHS  . insulin aspart  0-15 Units Subcutaneous Q6H  . insulin detemir  8 Units Subcutaneous BID  . midodrine  10 mg Oral TID WC  . multivitamin  1 tablet Oral QHS  . sodium chloride flush  10-40 mL Intracatheter Q12H   . sodium chloride    . ceFEPime (MAXIPIME) IV Stopped (08/24/19 1723)  . heparin 1,550 Units/hr (  08/25/19 0530)  . norepinephrine (LEVOPHED) Adult infusion 8 mcg/min (08/24/19 2249)  . sodium thiosulfate infusion for calciphylaxis 25 g (08/23/19 1114)  . vancomycin 750 mg (08/23/19 1124)   sodium chloride, acetaminophen, albuterol, bisacodyl, heparin, HYDROmorphone, hydrOXYzine, influenza vac split quadrivalent PF, lidocaine (PF), lidocaine (PF), lidocaine-prilocaine, ondansetron (ZOFRAN) IV, pentafluoroprop-tetrafluoroeth, pneumococcal 23 valent vaccine, senna-docusate, sodium chloride flush Recent Labs  Lab 08/22/19 0946 08/22/19 2300 08/23/19 0202 08/23/19 1349 08/24/19 0240 08/24/19 0240 08/24/19 1121 08/24/19 2314  NA 140  --  136  --  139  --   --   --   K 4.6  --  4.5  --  4.6  --   --   --   CL 99  --  93*  --  95*  --   --   --   CO2 18*  --  20*  --  18*  --   --   --   GLUCOSE 153*   < > 139*   < >  154*   < > 132* 129*  BUN 64*  --  75*  --  78*  --   --   --   CREATININE 5.19*  --  5.92*  --  5.96*  --   --   --   CALCIUM 7.7*  --  7.6*  --  8.0*  --   --   --   PHOS 4.7*  --  4.8*  --   --   --   --   --    < > = values in this interval not displayed.

## 2019-08-26 DIAGNOSIS — Z515 Encounter for palliative care: Secondary | ICD-10-CM

## 2019-08-26 DIAGNOSIS — Z7189 Other specified counseling: Secondary | ICD-10-CM

## 2019-08-26 LAB — COMPREHENSIVE METABOLIC PANEL
ALT: 18 U/L (ref 0–44)
AST: 15 U/L (ref 15–41)
Albumin: 2 g/dL — ABNORMAL LOW (ref 3.5–5.0)
Alkaline Phosphatase: 127 U/L — ABNORMAL HIGH (ref 38–126)
Anion gap: 23 — ABNORMAL HIGH (ref 5–15)
BUN: 108 mg/dL — ABNORMAL HIGH (ref 6–20)
CO2: 19 mmol/L — ABNORMAL LOW (ref 22–32)
Calcium: 8 mg/dL — ABNORMAL LOW (ref 8.9–10.3)
Chloride: 96 mmol/L — ABNORMAL LOW (ref 98–111)
Creatinine, Ser: 7.76 mg/dL — ABNORMAL HIGH (ref 0.61–1.24)
GFR calc Af Amer: 8 mL/min — ABNORMAL LOW (ref >60)
GFR calc non Af Amer: 7 mL/min — ABNORMAL LOW (ref >60)
Glucose, Bld: 129 mg/dL — ABNORMAL HIGH (ref 70–99)
Potassium: 5.1 mmol/L (ref 3.5–5.1)
Sodium: 138 mmol/L (ref 135–145)
Total Bilirubin: 0.7 mg/dL (ref 0.3–1.2)
Total Protein: 6.4 g/dL — ABNORMAL LOW (ref 6.5–8.1)

## 2019-08-26 LAB — CBC
HCT: 20.7 % — ABNORMAL LOW (ref 39.0–52.0)
Hemoglobin: 6 g/dL — CL (ref 13.0–17.0)
MCH: 26.8 pg (ref 26.0–34.0)
MCHC: 29 g/dL — ABNORMAL LOW (ref 30.0–36.0)
MCV: 92.4 fL (ref 80.0–100.0)
Platelets: 307 10*3/uL (ref 150–400)
RBC: 2.24 MIL/uL — ABNORMAL LOW (ref 4.22–5.81)
RDW: 20.1 % — ABNORMAL HIGH (ref 11.5–15.5)
WBC: 14.7 10*3/uL — ABNORMAL HIGH (ref 4.0–10.5)
nRBC: 0 % (ref 0.0–0.2)

## 2019-08-26 LAB — GLUCOSE, CAPILLARY
Glucose-Capillary: 85 mg/dL (ref 70–99)
Glucose-Capillary: 112 mg/dL — ABNORMAL HIGH (ref 70–99)
Glucose-Capillary: 79 mg/dL (ref 70–99)
Glucose-Capillary: 114 mg/dL — ABNORMAL HIGH (ref 70–99)

## 2019-08-26 LAB — PREPARE RBC (CROSSMATCH)

## 2019-08-26 MED ORDER — HEPARIN SODIUM (PORCINE) 1000 UNIT/ML DIALYSIS: 2000.0000 [IU] | INTRAMUSCULAR | Status: DC | PRN

## 2019-08-26 MED ORDER — SODIUM CHLORIDE 0.9% IV SOLUTION: Freq: Once | INTRAVENOUS | Status: DC

## 2019-08-26 MED ORDER — HYDROMORPHONE HCL 1 MG/ML IJ SOLN
INTRAMUSCULAR | Status: AC
  Filled 2019-08-26: qty 0.5

## 2019-08-26 MED ORDER — CHLORHEXIDINE GLUCONATE CLOTH 2 % EX PADS: 6.0000 | MEDICATED_PAD | Freq: Every day | CUTANEOUS | Status: DC

## 2019-08-26 MED ORDER — HYDROMORPHONE HCL 1 MG/ML IJ SOLN
0.5000 mg | INTRAMUSCULAR | Status: DC | PRN
  Administered 2019-08-26: 0.5 mg via INTRAVENOUS

## 2019-08-26 MED ORDER — VANCOMYCIN HCL IN DEXTROSE 750-5 MG/150ML-% IV SOLN
750.0000 mg | Freq: Once | INTRAVENOUS | Status: AC
  Administered 2019-08-27: 750 mg via INTRAVENOUS
  Filled 2019-08-26: qty 150

## 2019-08-26 MED ORDER — HEPARIN SODIUM (PORCINE) 1000 UNIT/ML IJ SOLN
INTRAMUSCULAR | Status: AC
  Administered 2019-08-26: 2000 [IU] via INTRAVENOUS_CENTRAL
  Filled 2019-08-26: qty 11

## 2019-08-26 NOTE — Progress Notes (Signed)
Physical Therapy Treatment Patient Details Name: [name] MRN: [id number] DOB: [birth year] Today's Date: 08/26/2019    History of Present Illness 54 y.o. male with history of ESRD on HD with witnessed cardiac arrest, obtained ROSC after 3 minutes.  Required intubation for ongoing airway protection, ongoing unresponsiveness, and hypotension requiring vasopressor support.  Also with MRSA pneumonia on vancomycin.  Patient extubated 08/18/2019, off pressors.    PT Comments    Pt limited by LLE pain and pain in sacrum/buttocks from wounds. Pt requires significant physical assistance to roll in recliner at this time, declining lift to get back to bed or other attempts at transfer due to pain and weakness. Pt participates in HEP well but is very weak and requires some assistance from PT for any mobilization of LLE. PT provides pt with green theraband to perform resisted UE HEP. Pt will benefit from continued acute PT POC to reduce caregiver burden and improve bed mobility quality, however if patient continues to demonstrate limited functional progress acute PT will sign off.  Follow Up Recommendations  SNF;Supervision/Assistance - 24 hour(if aligns with goals of care)     Equipment Recommendations  Wheelchair (measurements PT);Wheelchair cushion (measurements PT);Other (comment);Hospital bed    Recommendations for Other Services       Precautions / Restrictions Precautions Precautions: Fall Precaution Comments: perm R fem HD cath  Restrictions Weight Bearing Restrictions: No Other Position/Activity Restrictions: old L BKA, weeping wounds    Mobility  Bed Mobility Overal bed mobility: Needs Assistance Bed Mobility: Rolling Rolling: Max assist         General bed mobility comments: pt rolls left and right in chair with maxA to assist in hygiene tasks  Transfers Overall transfer level: (pt declines transfer, wants to remain in chair)                   Ambulation/Gait                 Stairs             Wheelchair Mobility    Modified Rankin (Stroke Patients Only)       Balance                                            Cognition Arousal/Alertness: Awake/alert Behavior During Therapy: Flat affect Overall Cognitive Status: No family/caregiver present to determine baseline cognitive functioning                                        Exercises General Exercises - Lower Extremity Gluteal Sets: AROM;Both;10 reps Hip ABduction/ADduction: AROM;Right;10 reps;Left;5 reps Straight Leg Raises: AROM;Right;10 reps;Left;5 reps Other Exercises Other Exercises: R hip IR/ER 10 reps Other Exercises: Rows from supine to long sitting in recliner x 10 reps pulling on PT BUE    General Comments General comments (skin integrity, edema, etc.): VSS on 2L [place]      Pertinent Vitals/Pain Pain Assessment: Faces Faces Pain Scale: Hurts whole lot Pain Location: LLE Pain Descriptors / Indicators: Grimacing Pain Intervention(s): Limited activity within patient's tolerance    Home Living                      Prior Function  PT Goals (current goals can now be found in the care plan section) Acute Rehab PT Goals Patient Stated Goal: to get better Progress towards PT goals: Not progressing toward goals - comment(limited by pain and weakness)    Frequency    Min 2X/week      PT Plan Current plan remains appropriate    Co-evaluation              AM-PAC PT "6 Clicks" Mobility   Outcome Measure  Help needed turning from your back to your side while in a flat bed without using bedrails?: Total Help needed moving from lying on your back to sitting on the side of a flat bed without using bedrails?: Total Help needed moving to and from a bed to a chair (including a wheelchair)?: Total Help needed standing up from a chair using your arms (e.g., wheelchair or bedside  chair)?: Total Help needed to walk in hospital room?: Total Help needed climbing 3-5 steps with a railing? : Total 6 Click Score: 6    End of Session Equipment Utilized During Treatment: Oxygen Activity Tolerance: Patient limited by pain Patient left: in chair;with call bell/phone within reach Nurse Communication: Mobility status;Need for lift equipment PT Visit Diagnosis: Muscle weakness (generalized) (M62.81);Other abnormalities of gait and mobility (R26.89)     Time: [id number] PT Time Calculation (min) (ACUTE ONLY): 23 min  Charges:  $Therapeutic Exercise: 8-22 mins $Therapeutic Activity: 8-22 mins                     Zenaida Niece, PT, DPT Acute Rehabilitation Pager: [phone]    Zenaida Niece 08/26/2019, 2:27 PM

## 2019-08-26 NOTE — Progress Notes (Signed)
Nutrition Follow up  DOCUMENTATION CODES:   Not applicable  INTERVENTION:    Continue Nepro Shake po TID, each supplement provides 425 kcal and 19 grams protein  Continue 30 ml Prostat BID, each supplement provides 100 kcals and 15 grams protein.   Continue renal MVI daily   NUTRITION DIAGNOSIS:   Increased nutrient needs related to wound healing as evidenced by estimated needs.  Ongoing  GOAL:   Patient will meet greater than or equal to 90% of their needs   Progressing   MONITOR:   PO intake, Weight trends, Labs, I & O's, Supplement acceptance, Skin  REASON FOR ASSESSMENT:   Ventilator    ASSESSMENT:   Pt with a PMH significant for L BKA, ESRD, DM, GERD, secondary hyperparathyroidism calciphylaxis and PVD who was brought to the ED s/p cardiac arrest.   1/28- start CRRT 2/3- extubated, stop CRRT  2/4- s/p placement of L ex jugular line 2/5- start HD  Pt with gangrenous R foot with cold ischemic L transtibial amputation. Needs R AKA and possible L BKA revision. Initially pt wanted to focus more on comfort but now wants discuss surgical options. Did not go to HD yesterday, will likely go today.   Pt reports appetite is okay. Meal completions charted as 0-100% for his last eight meals (47% average). Drinking Nepro twice daily. Encouraged PO intake and to increase Nepros to TID. Pt willing to try.   EDW: 86 kg Current weight: 93.4 kg   I/O: +11,694 ml since admit Last HD on 2/8: 3000 ml net UF  Drips: levophed, sodium thiosulfate  Medications: sensipar, aranesp, SS novolog, levemir, rena-vit,  Labs: CBG 85-112   Diet Order:   Diet Order            Diet renal/carb modified with fluid restriction Diet-HS Snack? Nothing; Fluid restriction: 1200 mL Fluid; Room service appropriate? Yes; Fluid consistency: Thin  Diet effective now              EDUCATION NEEDS:   Education needs have been addressed  Skin:  Skin Assessment: Skin Integrity Issues: Skin  Integrity Issues:: Other (Comment) Other: calciphylaxis- penis, L thigh  DTI- head   Last BM:  2/11  Height:   Ht Readings from Last 1 Encounters:  07/28/2019 5\' 9"  (1.753 m)    Weight:   Wt Readings from Last 1 Encounters:  08/25/19 93.4 kg    Ideal Body Weight:  67.97 kg  BMI:  Body mass index is 30.41 kg/m.  Estimated Nutritional Needs:   Kcal:  2300-2500 kcal  Protein:  120-135 grams  Fluid:  1000 ml + UOP   Mariana Single RD, LDN Clinical Nutrition Pager # [phone]

## 2019-08-26 NOTE — Consult Note (Addendum)
Consultation Note Date: 08/26/2019   Patient Name: [name]  DOB: February 07, 1966  MRN: [id number]  Age / Sex: 54 y.o., male  [facility]P: [facility] Referring Physician: Kipp Brood, MD  Reason for Consultation: Establishing goals of care  HPI/Patient Profile: 54 y.o. male  with past medical history of ESRD on hemodialysis, left BKA, DM type 2, MRSA, anemia of chronic disease admitted on 07/18/2019 with witnessed cardiac arrest, ROSC after 3 minutes. Patient required intubation and vasopressor support. Found to have MRSA pneumonia, treated with vancomycin. Extubated 08/18/19. Hospitalization complicated by ongoing hypotension requiring pressors concerning for new source of infection.  CT L femur 2/7 showed heavily calcified vessels especially of superficial femoral artery, negative for abscess or osteomyelitis. Patient with cold gangrenous right foot and cold gangrenous left BKA. Remains on norepinephrine, vancomycin and cefepime. DVT's left common femoral vein and left femoral vein. Ortho and plastic surgery consulted for calciphylaxis/limb necrosis. Dr. Sharol Given discussed with patient potential for above-the-knee amputation on the right with possible revision amputation on the left. Palliative medicine consultation for goals of care.   Clinical Assessment and Goals of Care:  I have reviewed medical records, discussed with care team, and met with patient and wife (April) at bedside to discuss goals of care.   Introduced Palliative Medicine as specialized medical care for people living with serious illness. It focuses on providing relief from the symptoms and stress of a serious illness. The goal is to improve quality of life for both the patient and the family.  We discussed a brief life review of the patient. Prior to admission, patient living home and still fairly functional. Ambulating with prosthetic and  driving himself to dialysis MWF. He has been on dialysis since about 2005. His left BKA was many years ago. Appetite good.  Discussed events leading up to admission and course of hospitalization including diagnoses, interventions, plan of care. Frankly and compassionately discussed poor prognosis. Reviewed notes and recommendations from specialists.   The difference between aggressive medical intervention and comfort care was discussed. Discussed shift to comfort focused care including discontinuation of vasopressor support and fear that he will not be stable enough to leave the hospital. Patient does share it would be important to see his family. Discussed visitor policy.    Patient further states that he wishes to "fight" and he wishes to "try." April shares that yesterday he was saying he was "tired" and did not want the surgery, but after further conversations with family, he has changed his mind and considering surgery. Patient further explains his understanding that he could die with/during the surgery but also that he will die without the surgery and willing to risk surgery to prolong life. Explained high risk for surgery but that I would reach out to Dr. Sharol Given for follow-up conversation regarding surgical intervention.   Patient did not receive dialysis yesterday. He likely will need dialysis today.   Encouraged patient to consider what quality of life will be like moving forward if he is able to recover  from acute illness and surgery, explaining that he will likely be wheelchair-bound (with bilateral amputations) and more dependent on others.    Questions and concerns were addressed.  Hard Choices booklet and PMT contact information given to wife. Reassured of ongoing support from palliative medicine team.   Updated RN, Dr. Lynetta Mare, and Dr. Ouida Sills. RN has paged Dr. Sharol Given. Dr. Ouida Sills to update Dr. Jonnie Finner.    SUMMARY OF RECOMMENDATIONS    Continue current plan of care and medical  management.  Patient has further thought and discussed surgery with his family. Following conversation with patient and wife, patient wishes to continue to fight and willing to pursue surgical intervention understanding risks. Will notify Dr. Sharol Given to f/u with patient regarding surgical intervention and candidacy.  Patient will likely need HD today since skipped yesterday. Nephrology notified.  Continue hemodialysis.   PMT will continue to follow. Ongoing palliative discussions.   Code Status/Advance Care Planning:  DNR  Symptom Management:   Dilaudid 59m PO q6h prn pain  Dilaudid 0.571mx1 during visit  Continue gabapentin  Continue duloxetine  Palliative Prophylaxis:   Aspiration, Bowel Regimen, Delirium Protocol, Frequent Pain Assessment, Palliative Wound Care and Turn Reposition  Psycho-social/Spiritual:   Desire for further Chaplaincy support: yes  Additional Recommendations: Caregiving  Support/Resources, Compassionate Wean Education and Education on Hospice  Prognosis:   Poor prognosis  Discharge Planning: To Be Determined      Primary Diagnoses: Present on Admission: . [place]  I have reviewed the medical record, interviewed the patient and family, and examined the patient. The following aspects are pertinent.  Past Medical History:  Diagnosis Date  . Anemia due to chronic kidney disease   . [facility]  . [place] [place]  . Personal history of MRSA (methicillin resistant Staphylococcus aureus) 07/2020   pcr positive   Social History   Socioeconomic History  . Marital status: Married    Spouse name: Not on file  . Number of children: Not on file  . Years of education: Not on file  . Highest education level: Not on file  Occupational History  . Not on file  Tobacco Use  . Smoking status: Current Some Day Smoker    Types: Cigarettes  . Smokeless tobacco: Never Used  Substance and  Sexual Activity  . Alcohol use: Yes    Comment: occasional  . Drug use: Yes    Types: Marijuana  . Sexual activity: Not on file  Other Topics Concern  . Not on file  Social History Narrative  . Not on file   Social Determinants of Health   Financial Resource Strain:   . Difficulty of Paying Living Expenses: Not on file  Food Insecurity:   . Worried About RuCharity fundraisern the Last Year: Not on file  . Ran Out of Food in the Last Year: Not on file  Transportation Needs:   . Lack of Transportation (Medical): Not on file  . Lack of Transportation (Non-Medical): Not on file  Physical Activity:   . Days of Exercise per Week: Not on file  . Minutes of Exercise per Session: Not on file  Stress:   . Feeling of Stress : Not on file  Social Connections:   . Frequency of Communication with Friends and Family: Not on file  . Frequency of Social Gatherings with Friends and Family: Not on file  . Attends Religious Services: Not on file  . Active Member  of Clubs or Organizations: Not on file  . Attends Archivist Meetings: Not on file  . Marital Status: Not on file   History reviewed. No pertinent family history. Scheduled Meds: . sodium chloride   Intravenous Once  . Chlorhexidine Gluconate Cloth  6 each Topical Q0600  . cinacalcet  30 mg Oral Q breakfast  . darbepoetin (ARANESP) injection - DIALYSIS  150 mcg Intravenous Q Sat-HD  . DULoxetine  20 mg Oral Daily  . feeding supplement (NEPRO CARB STEADY)  237 mL Oral TID BM  . feeding supplement (PRO-STAT SUGAR FREE 64)  30 mL Oral BID  . gabapentin  200 mg Oral QHS  . insulin aspart  0-15 Units Subcutaneous TID WC  . insulin detemir  8 Units Subcutaneous BID  . midodrine  10 mg Oral TID WC  . multivitamin  1 tablet Oral QHS  . sodium chloride flush  10-40 mL Intracatheter Q12H   Continuous Infusions: . sodium chloride    . ceFEPime (MAXIPIME) IV Stopped (08/25/19 1913)  . norepinephrine (LEVOPHED) Adult infusion 6  mcg/min (08/26/19 1100)  . sodium thiosulfate infusion for calciphylaxis 25 g (08/23/19 1114)  . vancomycin 750 mg (08/23/19 1124)   PRN Meds:.sodium chloride, acetaminophen, albuterol, bisacodyl, heparin, HYDROmorphone (DILAUDID) injection, HYDROmorphone, hydrOXYzine, influenza vac split quadrivalent PF, lidocaine (PF), lidocaine (PF), lidocaine-prilocaine, ondansetron (ZOFRAN) IV, pentafluoroprop-tetrafluoroeth, pneumococcal 23 valent vaccine, polyethylene glycol, senna-docusate, sodium chloride flush Medications Prior to Admission:  Prior to Admission medications   Medication Sig Start Date End Date Taking? Authorizing Provider  b complex-vitamin c-folic acid (NEPHRO-VITE) 0.8 MG TABS tablet Take 1 tablet by mouth daily. 05/18/19   [provider]  calcium acetate (PHOSLO) 667 MG tablet Take 1,334 mg by mouth 3 (three) times daily. 07/21/19   [provider]  COLCRYS 0.6 MG tablet Take 0.6 mg by mouth daily as needed (gout flare).  03/24/19   [provider]  hydrALAZINE (APRESOLINE) 25 MG tablet Take 37.5 mg by mouth 3 (three) times daily. 03/29/19   [provider]  isosorbide mononitrate (IMDUR) 30 MG 24 hr tablet Take 30 mg by mouth daily. 03/29/19   [provider]  LEVEMIR FLEXTOUCH 100 UNIT/ML Pen Inject 25 Units into the skin daily. 07/30/19   [provider]  metoprolol succinate (TOPROL-XL) 50 MG 24 hr tablet Take 50 mg by mouth daily. 03/29/19   [provider]  NOVOLIN 70/30 FLEXPEN (70-30) 100 UNIT/ML PEN Inject 20 Units into the skin daily. 03/11/19   [provider]  NOVOLOG FLEXPEN 100 UNIT/ML FlexPen Inject 1 Units into the skin at bedtime. 07/30/19   [provider]  oxyCODONE-acetaminophen (PERCOCET) 7.5-325 MG tablet Take 1 tablet by mouth every 4 (four) hours as needed. 07/26/19   [provider]  oxyCODONE-acetaminophen (PERCOCET/ROXICET) 5-325 MG tablet Take 1 tablet by mouth 4 (four) times daily  as needed. 07/19/19   [provider]  pantoprazole (PROTONIX) 40 MG tablet Take 40 mg by mouth daily. 06/23/19   [provider]  RENVELA 800 MG tablet Take 2,400 mg by mouth 3 (three) times daily. 03/11/19   [provider]  rosuvastatin (CRESTOR) 5 MG tablet Take 5 mg by mouth daily. 03/11/19   [provider]  TRULICITY 6.04 VW/0.9WJ SOPN Inject 1 mg into the skin once a week. 07/30/19   [provider]  VELPHORO 500 MG chewable tablet Chew 500 mg by mouth 3 (three) times daily. 06/08/19   [provider]  [place] Cutter  HFA 108 (90 Base) MCG/ACT inhaler Inhale 2 puffs into the lungs 4 (four) times daily as needed for wheezing or shortness of breath. 06/23/19   [provider]   Allergies  Allergen Reactions  . Amlodipine Itching  . Dacarbazine And Related Nausea And Vomiting  . Fentanyl Itching  . Hydrocodone Nausea And Vomiting  . Propofol Itching   Review of Systems  Constitutional:       Generalized pain   Physical Exam Vitals and nursing note reviewed.  Constitutional:      General: He is awake.     Appearance: He is ill-appearing.  HENT:     Head: Normocephalic and atraumatic.  Cardiovascular:     Rate and Rhythm: Normal rate.  Pulmonary:     Effort: No tachypnea, accessory muscle usage or respiratory distress.  Abdominal:     Tenderness: There is no abdominal tenderness.  Skin:    Comments: Left BKA, right toes gangrene/cool  Neurological:     Mental Status: He is alert and oriented to person, place, and time.  Psychiatric:        Mood and Affect: Mood normal.        Speech: Speech normal.        Behavior: Behavior normal.        Cognition and Memory: Cognition normal.     Vital Signs: BP 117/70   Pulse 89   Temp 98.8 F (37.1 C) (Oral)   Resp 12   Ht '5\' 9"'  (1.753 m)   Wt 93.4 kg   SpO2 97%   BMI 30.41 kg/m  Pain Scale: 0-10 POSS *See Group Information*: 1-Acceptable,Awake and alert Pain Score:  10-Worst pain ever   SpO2: SpO2: 97 % O2 Device:SpO2: 97 % O2 Flow Rate: .O2 Flow Rate (L/min): 2 L/min  IO: Intake/output summary:   Intake/Output Summary (Last 24 hours) at 08/26/2019 1121 Last data filed at 08/26/2019 1100 Gross per 24 hour  Intake 1201.29 ml  Output --  Net 1201.29 ml    LBM: Last BM Date: 08/26/19 Baseline Weight: Weight: 87.9 kg Most recent weight: Weight: 93.4 kg     Palliative Assessment/Data: PPS 40%   Flowsheet Rows     Most Recent Value  Intake Tab  Referral Department  Critical care  Unit at Time of Referral  ICU  Palliative Care Primary Diagnosis  Other (Comment)  Palliative Care Type  New Palliative care  Reason for referral  Clarify Goals of Care  Date first seen by Palliative Care  08/26/19  Clinical Assessment  Palliative Performance Scale Score  40%  Psychosocial & Spiritual Assessment  Palliative Care Outcomes  Patient/Family meeting held?  Yes  Who was at the meeting?  patient and wife  Palliative Care Outcomes  Clarified goals of care, Provided end of life care assistance, Provided psychosocial or spiritual support, ACP counseling assistance, Improved pain interventions      Time In: 1000 Time Out: 1130 Time Total: 90 min Greater than 50%  of this time was spent counseling and coordinating care related to the above assessment and plan.  Signed by:  Ihor Dow, DNP, FNP-C Palliative Medicine Team  Phone: [phone] Fax: [phone]   Please contact Palliative Medicine Team phone at [phone] for questions and concerns.  For individual provider: See Shea Evans

## 2019-08-26 NOTE — Progress Notes (Signed)
NAME:  [name], MRN:  [id number], DOB:  09/24/1965, LOS: 20 ADMISSION DATE:  07/30/2019, CONSULTATION DATE: 08/06/2019 REFERRING MD: Dr. Roxanne Mins, CHIEF COMPLAINT: Cardiac arrest  Brief History   54 year old male who is status post cardiac arrest and septic shock secondary to MRSA pneumonia.  History of present illness   Patient was found unresponsive 07/23/2019 and had witnessed cardiac arrest, obtained ROSC after 3 minutes.  Patient required intubation for ongoing airway protection and unresponsiveness. Required vasopressor support for hypotension.  Additionally found to have MRSA pneumonia, currently being treated with vancomycin.  Patient extubated on 08/18/2019.  Patient continues to require pressors for BP and HR despite completed treatment of MRSA Pneumonia. Concern is growing for new source of infection or inflammation, including left thigh calciphylaxis, penile gangrene, or other pathology of right lower leg.    Past Medical History   Past Medical History:  Diagnosis Date   Anemia due to chronic kidney disease    Cardiac arrest ([place]) 07/21/2019   End-stage renal disease on hemodialysis [facility])    Personal history of MRSA (methicillin resistant Staphylococcus aureus) 07/2020   pcr positive    Significant Hospital Events   1/28 Admitted. On levophed and added vasopressin  1/29: O/N Hyperkalemic, CRRT for correction also GOC discussion with family, made DNR. Still on levophed and multiple analgesics/sedatives. 2/03: Extubated, off pressors 2/04: Attempted bedside central line placement, IR consulted and placed L External Jugular line. 2/07: ACTH stimulation test performed, results normal 2/09: Lower Extremity dopplers reveal DVT in L common femoral vein and L ("superficial") femoral vein  Consults:  Nephrology Orthopedics Plastics  Procedures:  2/4 - central line placement by IR  Significant Diagnostic Tests:  DG Chest 1 View  Result Date: 08/19/2019 CLINICAL  DATA:  Central line placement EXAM: CHEST  1 VIEW COMPARISON:  08/17/2019 FINDINGS: Frontal view of the chest demonstrates stable cardiac silhouette. Vascular stent right subclavian distribution unchanged. Central venous catheter from an inferior approach tip overlies the junction of the inferior vena cava and right atrium. I do not see any new central venous catheter is on this exam. Stable bibasilar scarring. No airspace disease, effusion, or pneumothorax. IMPRESSION: 1. Stable central venous catheter from an inferior approach. 2. No other central lines are identified on this exam. Electronically Signed   By: Randa Ngo M.D.   On: 08/19/2019 14:14   CT Head Wo Contrast  Result Date: 08/11/2019 CLINICAL DATA:  Cerebral hemorrhage suspected, unresponsive, found by friend post witnessed arrest EXAM: CT HEAD WITHOUT CONTRAST TECHNIQUE: Contiguous axial images were obtained from the base of the skull through the vertex without intravenous contrast. COMPARISON:  None. FINDINGS: Brain: No evidence of acute infarction, hemorrhage, hydrocephalus, extra-axial collection or mass lesion/mass effect. Symmetric prominence of the ventricles, cisterns and sulci compatible with parenchymal volume loss. Patchy areas of white matter hypoattenuation are most compatible with chronic microvascular angiopathy. Extensive dural calcifications are noted along the falx and tentorium. Vascular: Extensive calcification of the carotid siphons and vertebral arteries. No unexpected hyperdense vessel is evident. Skull: No calvarial fracture or suspicious osseous lesion. No scalp swelling or hematoma. Diffuse scalp edema is seen. Benign scattered scalp calcifications. Sinuses/Orbits: Patient is intubated at the time of exam with tract transesophageal and endotracheal tubes extending below the level of imaging. Secretions in the ethmoids and posterior nasopharynx are likely related to this intubation. Minimal mucosal thickening in the  maxillary sinuses. Prior lens extractions are noted. Mildly proptotic configuration of the globes which is symmetric  bilaterally. No abnormal thickening of the extraocular musculature or abnormality of the optic nerve should complexes or superior ophthalmic veins. Other: None IMPRESSION: 1. No CT evidence of acute intracranial process. 2. Chronic microvascular ischemic disease and parenchymal volume loss. 3. Diffuse soft tissue edema.  Correlate with hydration status. 4. Extensive intracranial atherosclerosis. 5. Secretions in the ethmoids and posterior nasopharynx are likely to intubation. 6. Mildly proptotic configuration of the globes which is symmetric bilaterally. Electronically Signed   By: Lovena Le M.D.   On: 08/11/2019 02:43   CT FEMUR LEFT W CONTRAST  Result Date: 08/22/2019 CLINICAL DATA:  Calciphylaxis and foul-smelling leg wound. EXAM: CT OF THE LOWER RIGHT EXTREMITY WITH CONTRAST TECHNIQUE: Multidetector CT imaging of the lower right extremity was performed according to the standard protocol following intravenous contrast administration. COMPARISON:  None. 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 No evidence of osteomyelitis. 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 Ligaments Suboptimally assessed by CT. Muscles and Tendons Broad skin defect along the anterolateral left thigh without undermining or collection. A knee joint effusion is present without any articular erosions or significant size to implicate septic effusion. Soft tissues Bilateral subcutaneous reticulation, likely from volume overload. There could be an element of cellulitis which would be better detected on exam. No evidence of fasciitis. No soft tissue emphysema. Heavily calcified vessels especially of the superficial femoral artery which is not visibly  enhancing. IMPRESSION: 1. Negative for abscess or osteomyelitis. 2. Renal osteodystrophy. Electronically Signed   By: Monte Fantasia M.D.   On: 08/22/2019 14:22   IR Fluoro Guide CV Line Left  Result Date: 08/19/2019 INDICATION: 54 year old with poor venous access, end-stage renal disease and hemodialysis. Recent cardiac arrest. EXAM: FLUOROSCOPIC AND ULTRASOUND GUIDED PLACEMENT OF A NON TUNNELED VENOUS CATHETER Physician: Stephan Minister. Henn, MD MEDICATIONS: None ANESTHESIA/SEDATION: None FLUOROSCOPY TIME:  Fluoroscopy Time: 1 minutes and 18 seconds COMPLICATIONS: None immediate. PROCEDURE: Informed consent was obtained for catheter placement. The patient was placed supine on the interventional table. Ultrasound confirmed a patent left external jugular vein. Ultrasound images were obtained for documentation. The left neck was prepped and draped in a sterile fashion. The left neck was anesthetized with 1% lidocaine. Maximal barrier sterile technique was utilized including caps, mask, sterile gowns, sterile gloves, sterile drape, hand hygiene and skin antiseptic. A 21 gauge needle directed into the left external jugular vein with ultrasound guidance. A micropuncture dilator set was placed. Central venogram was performed. Dual lumen Power PICC line was selected. Catheter was cut to 8 cm. A peel-away sheath was placed over a 0.018 wire. The Power PICC was advanced through the peel-away sheath and positioned in the left innominate vein. Both lumens aspirated and flushed well. Both lumens were flushed with saline, capped and clamped. Catheter was sutured to skin. Dressing was placed over the catheter. Fluoroscopic and ultrasound images were taken and saved for documentation. FINDINGS: Patent left external jugular vein. Central venogram demonstrates obstruction of the central left innominate vein with extensive collateral flow into the SVC. Tip of the venous catheter was placed in what appears to be the left innominate vein.  IMPRESSION: Placement of a left external jugular venous catheter. Catheter tip in the region of the left innominate vein. 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 Electronically Signed   By: Markus Daft M.D.   On: 08/19/2019 18:20   IR US  Guide Vasc Access Left  Result Date: 08/19/2019 INDICATION: 54 year old with poor venous access, end-stage renal disease and hemodialysis. Recent cardiac arrest. EXAM: FLUOROSCOPIC AND ULTRASOUND GUIDED PLACEMENT OF A NON TUNNELED VENOUS CATHETER Physician: Stephan Minister. Henn, MD MEDICATIONS: None ANESTHESIA/SEDATION: None FLUOROSCOPY TIME:  Fluoroscopy Time: 1 minutes and 18 seconds COMPLICATIONS: None immediate. PROCEDURE: Informed consent was obtained for catheter placement. The patient was placed supine on the interventional table. Ultrasound confirmed a patent left external jugular vein. Ultrasound images were obtained for documentation. The left neck was prepped and draped in a sterile fashion. The left neck was anesthetized with 1% lidocaine. Maximal barrier sterile technique was utilized including caps, mask, sterile gowns, sterile gloves, sterile drape, hand hygiene and skin antiseptic. A 21 gauge needle directed into the left external jugular vein with ultrasound guidance. A micropuncture dilator set was placed. Central venogram was performed. Dual lumen Power PICC line was selected. Catheter was cut to 8 cm. A peel-away sheath was placed over a 0.018 wire. The Power PICC was advanced through the peel-away sheath and positioned in the left innominate vein. Both lumens aspirated and flushed well. Both lumens were flushed with saline, capped and clamped. Catheter was sutured to skin. Dressing was placed over the catheter. Fluoroscopic and ultrasound images were taken and saved for documentation. FINDINGS: Patent left external jugular vein. Central venogram demonstrates obstruction of the central left innominate vein with extensive collateral flow into the SVC. Tip  of the venous catheter was placed in what appears to be the left innominate vein. IMPRESSION: Placement of a left external jugular venous catheter. Catheter tip in the region of the left innominate vein. 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 Electronically Signed   By: Markus Daft M.D.   On: 08/19/2019 18:20   DG Chest Port 1 View  Result Date: 08/17/2019 CLINICAL DATA:  Recent cardiac arrest, check endotracheal tube placement EXAM: PORTABLE CHEST 1 VIEW COMPARISON:  08/15/2018 FINDINGS: Endotracheal tube and gastric catheter are noted in satisfactory position. Femoral dialysis catheter is noted in satisfactory position. Mild atelectatic changes are noted in the left base. No effusion is seen. The right subclavian venous stent and brachial venous stent are noted. IMPRESSION: Mild left basilar atelectasis. Tubes and lines as described Electronically Signed   By: Inez Catalina M.D.   On: 08/17/2019 02:21   DG Chest Port 1 View  Result Date: 08/16/2019 CLINICAL DATA:  Respiratory failure. EXAM: PORTABLE CHEST 1 VIEW COMPARISON:  02/20/2018 FINDINGS: The endotracheal tube is 5.5 cm above the carina. The NG tube is coursing down the esophagus and into the stomach. A central venous catheter is noted in the right atrium coming up from the IVC. Vascular stents are noted. Aortic calcifications are stable. The heart is normal in size. Streaky left basilar atelectasis but overall improved lung aeration since prior study. IMPRESSION: 1. Stable support apparatus. 2. Improved lung aeration with residual streaky left basilar atelectasis. Electronically Signed   By: Marijo Sanes M.D.   On: 08/16/2019 07:07   DG CHEST PORT 1 VIEW  Result Date: 07/28/2019 CLINICAL DATA:  Endotracheal tube placement. EXAM: PORTABLE CHEST 1 VIEW COMPARISON:  Earlier same day FINDINGS: Endotracheal tube tip 5.5 cm from the carina. Orogastric tube enters the abdomen. Femoral venous catheter tip in the right atrium. Slight  worsening of density in the mid and lower lungs left more than right could be edema, pneumonia or atelectasis. Upper lungs remain clear. IMPRESSION: Endotracheal tube tip 5.5 cm above the carina.  Worsened bilateral lower lung density left more than right could be edema, pneumonia or atelectasis. Electronically Signed   By: Nelson Chimes M.D.   On: 08/05/2019 14:32   DG Chest Port 1 View  Result Date: 07/24/2019 CLINICAL DATA:  Post intubation EXAM: PORTABLE CHEST 1 VIEW COMPARISON:  Radiograph February 20, 2018 FINDINGS: Endotracheal tube terminates in the mid trachea, 5 cm from the carina. Transesophageal tube tip and side port distal to the GE junction, terminating below the level of imaging. Telemetry leads and pacer pads overlie the chest. Some bandlike opacity in the left mid lung likely reflect subsegmental atelectasis. No consolidation, features of edema, pneumothorax, or effusion. The cardiomediastinal contours are unremarkable. No acute osseous or soft tissue abnormality. IMPRESSION: Lines and tubes as above. Subsegmental atelectasis in the left mid lung. No other acute cardiopulmonary abnormality. Electronically Signed   By: Lovena Le M.D.   On: 07/21/2019 01:59   DG Abd Portable 1V  Result Date: 08/18/2019 CLINICAL DATA:  OG tube placement EXAM: PORTABLE ABDOMEN - 1 VIEW COMPARISON:  08/16/2019 FINDINGS: The tip of the OG tube projects over the gastric body. There is a femoral approach dialysis catheter that appears well position. The stomach is distended. The bowel gas pattern is otherwise nonspecific. IMPRESSION: Enteric tube terminates over the stomach. Electronically Signed   By: Constance Holster M.D.   On: 08/18/2019 00:21   DG Abd Portable 1V  Result Date: 08/16/2019 CLINICAL DATA:  Enteric tube placement EXAM: PORTABLE ABDOMEN - 1 VIEW COMPARISON:  None. FINDINGS: Enteric tube terminates within the body of the stomach. Femoral approach central line tip overlies the right atrium.  Visualized bowel gas pattern is unremarkable. IMPRESSION: Enteric tube terminates within the stomach. Electronically Signed   By: Macy Mis M.D.   On: 08/16/2019 16:34   ECHOCARDIOGRAM COMPLETE  Result Date: 08/13/2019   ECHOCARDIOGRAM REPORT   Patient Name:   RYN SKUBAL Date of Exam: 08/13/2019 Medical Rec #:  [id number]         Height:       69.0 in Accession #:    [id number]        Weight:       193.8 lb Date of Birth:  30-Aug-1965         BSA:          2.04 m Patient Age:    69 years          BP:           92/66 mmHg Patient Gender: M                 HR:           97 bpm. Exam Location:  Inpatient Procedure: 2D Echo, Cardiac Doppler and Color Doppler Indications:    Cardiac Arrest 427.5  History:        Patient has no prior history of Echocardiogram examinations.  Sonographer:    Jonelle Sidle Dance Referring Phys: [id number] PAULA B SIMPSON  Sonographer Comments: Echo performed with patient supine and on artificial respirator. Image acquisition challenging due to respiratory motion. IMPRESSIONS  1. Left ventricular ejection fraction, by visual estimation, is 30 to 35%. The left ventricle has moderately decreased function. There is mildly increased left ventricular hypertrophy.  2. Left ventricular diastolic parameters are consistent with Grade I diastolic dysfunction (impaired relaxation).  3. The left ventricle demonstrates global hypokinesis.  4. Global right ventricle has mildly reduced systolic function.The right ventricular size is normal. No  increase in right ventricular wall thickness.  5. Left atrial size was normal.  6. Right atrial size was normal.  7. The pericardial effusion is circumferential.  8. Trivial pericardial effusion is present.  9. The mitral valve is normal in structure. No evidence of mitral valve regurgitation. 10. The tricuspid valve is normal in structure. 11. The tricuspid valve is normal in structure. Tricuspid valve regurgitation is trivial. 12. The aortic valve is normal in  structure. Aortic valve regurgitation is not visualized. 13. The pulmonic valve was grossly normal. Pulmonic valve regurgitation is not visualized. 14. Normal pulmonary artery systolic pressure. 15. The tricuspid regurgitant velocity is 2.32 m/s, and with an assumed right atrial pressure of 3 mmHg, the estimated right ventricular systolic pressure is normal at 24.5 mmHg. 16. The inferior vena cava is normal in size with greater than 50% respiratory variability, suggesting right atrial pressure of 3 mmHg. 17. No significant change from prior study. 18. Prior images reviewed side by side. FINDINGS  Left Ventricle: Left ventricular ejection fraction, by visual estimation, is 30 to 35%. The left ventricle has moderately decreased function. The left ventricle demonstrates global hypokinesis. The left ventricular internal cavity size was the left ventricle is normal in size. There is mildly increased left ventricular hypertrophy. Concentric left ventricular hypertrophy. Left ventricular diastolic parameters are consistent with Grade I diastolic dysfunction (impaired relaxation). Right Ventricle: The right ventricular size is normal. No increase in right ventricular wall thickness. Global RV systolic function is has mildly reduced systolic function. The tricuspid regurgitant velocity is 2.32 m/s, and with an assumed right atrial pressure of 3 mmHg, the estimated right ventricular systolic pressure is normal at 24.5 mmHg. Left Atrium: Left atrial size was normal in size. Right Atrium: Right atrial size was normal in size Pericardium: Trivial pericardial effusion is present. The pericardial effusion is circumferential. Mitral Valve: The mitral valve is normal in structure. No evidence of mitral valve regurgitation. Tricuspid Valve: The tricuspid valve is normal in structure. Tricuspid valve regurgitation is trivial. Aortic Valve: The aortic valve is normal in structure. Aortic valve regurgitation is not visualized. Pulmonic  Valve: The pulmonic valve was grossly normal. Pulmonic valve regurgitation is not visualized. Pulmonic regurgitation is not visualized. Aorta: The aortic root and ascending aorta are structurally normal, with no evidence of dilitation. Venous: The inferior vena cava is normal in size with greater than 50% respiratory variability, suggesting right atrial pressure of 3 mmHg. IAS/Shunts: No atrial level shunt detected by color flow Doppler. Additional Comments: Little change from 06/08/2019 (note different MRN).  LEFT VENTRICLE PLAX 2D LVIDd:         4.20 cm  Diastology LVIDs:         3.90 cm  LV e' lateral:   5.78 cm/s LV PW:         1.30 cm  LV E/e' lateral: 7.5 LV IVS:        1.20 cm  LV e' medial:    5.78 cm/s LVOT diam:     2.10 cm  LV E/e' medial:  7.5 LV SV:         13 ml LV SV Index:   6.06 LVOT Area:     3.46 cm  RIGHT VENTRICLE            IVC RV Basal diam:  2.10 cm    IVC diam: 1.40 cm RV S prime:     8.24 cm/s TAPSE (M-mode): 1.6 cm LEFT ATRIUM  Index       RIGHT ATRIUM          Index LA diam:      2.70 cm 1.32 cm/m  RA Area:     9.52 cm LA Vol (A4C): 26.9 ml 13.20 ml/m RA Volume:   18.20 ml 8.93 ml/m  AORTIC VALVE LVOT Vmax:   80.20 cm/s LVOT Vmean:  53.900 cm/s LVOT VTI:    0.126 m  AORTA Ao Root diam: 3.50 cm Ao Asc diam:  2.50 cm MITRAL VALVE                        TRICUSPID VALVE MV Area (PHT): 3.53 cm             TR Peak grad:   21.5 mmHg MV PHT:        62.35 msec           TR Vmax:        232.00 cm/s MV Decel Time: 215 msec MV E velocity: 43.30 cm/s 103 cm/s  SHUNTS MV A velocity: 59.60 cm/s 70.3 cm/s Systemic VTI:  0.13 m MV E/A ratio:  0.73       1.5       Systemic Diam: 2.10 cm  Dani Gobble Croitoru MD Electronically signed by Sanda Klein MD Signature Date/Time: 08/13/2019/2:38:10 PM    Final    VAS [place] LOWER EXTREMITY VENOUS (DVT)  Result Date: 08/24/2019  Lower Venous DVT Study Indications: Edema.  Risk Factors: Surgery L BKA. Limitations: Body habitus, poor ultrasound/tissue  interface, bandages, open wound and patient pain tolerance, patient positioning, patient immobility. Comparison Study: No prior studies. Performing Technologist: Oliver Hum RVT  Examination Guidelines: A complete evaluation includes B-mode imaging, spectral Doppler, color Doppler, and power Doppler as needed of all accessible portions of each vessel. Bilateral testing is considered an integral part of a complete examination. Limited examinations for reoccurring indications may be performed as noted. The reflux portion of the exam is performed with the patient in reverse Trendelenburg.  +---------+---------------+---------+-----------+----------+--------------+  RIGHT     Compressibility Phasicity Spontaneity Properties Thrombus Aging  +---------+---------------+---------+-----------+----------+--------------+  CFV       Full            No        No                                     +---------+---------------+---------+-----------+----------+--------------+  SFJ       Full                                                             +---------+---------------+---------+-----------+----------+--------------+  FV Prox   Full                                                             +---------+---------------+---------+-----------+----------+--------------+  FV Mid    Full                                                             +---------+---------------+---------+-----------+----------+--------------+  FV Distal Full                                                             +---------+---------------+---------+-----------+----------+--------------+  PFV       Full                                                             +---------+---------------+---------+-----------+----------+--------------+  POP       Full            No        No                                     +---------+---------------+---------+-----------+----------+--------------+  PTV       Full                                                              +---------+---------------+---------+-----------+----------+--------------+  PERO      Full                                                             +---------+---------------+---------+-----------+----------+--------------+ Arterial flow detected in the popliteal, posterior tibial, peroneal, and anterior tibial arteries demonstrate multiphasic waveforms.  +-------+---------------+---------+-----------+----------+--------------+  LEFT    Compressibility Phasicity Spontaneity Properties Thrombus Aging  +-------+---------------+---------+-----------+----------+--------------+  CFV     None            No        No                     Acute           +-------+---------------+---------+-----------+----------+--------------+  FV Prox None            No        No                     Acute           +-------+---------------+---------+-----------+----------+--------------+ Unable to obtain images distal to the proximal femoral vein due to open wounds and bandages. Unable to obtain iliac images due to patient body habitus, edema, and positioning.    Summary: RIGHT: - There is no evidence of deep vein thrombosis in the lower extremity. However, portions of this examination were limited- see technologist comments above.  - No cystic structure found in the popliteal fossa.  LEFT: - Findings consistent with acute deep vein thrombosis involving the left common femoral vein, and left femoral vein. - No cystic structure found in the popliteal fossa.  *See table(s) above for measurements and observations. Electronically signed by Deitra Mayo MD on 08/24/2019 at  3:09:08 PM.    Final    [place] EKG SITE RITE  Result Date: 08/18/2019 If Site Rite image not attached, placement could not be confirmed due to current cardiac rhythm.   Micro Data:   Results for orders placed or performed during the hospital encounter of 08/01/2019  Respiratory Panel by RT PCR (Flu A&B, Covid) - Nasopharyngeal Swab     Status: None    Collection Time: 08/10/2019  1:46 AM   Specimen: Nasopharyngeal Swab  Result Value Ref Range Status   SARS Coronavirus 2 by RT PCR NEGATIVE NEGATIVE Final    Comment: (NOTE) SARS-CoV-2 target nucleic acids are NOT DETECTED. The SARS-CoV-2 RNA is generally detectable in upper respiratoy specimens during the acute phase of infection. The lowest concentration of SARS-CoV-2 viral copies this assay can detect is 131 copies/mL. A negative result does not preclude SARS-Cov-2 infection and should not be used as the sole basis for treatment or other patient management decisions. A negative result may occur with  improper specimen collection/handling, submission of specimen other than nasopharyngeal swab, presence of viral mutation(s) within the areas targeted by this assay, and inadequate number of viral copies (<131 copies/mL). A negative result must be combined with clinical observations, patient history, and epidemiological information. The expected result is Negative. Fact Sheet for Patients:  PinkCheek.be Fact Sheet for Healthcare Providers:  GravelBags.it This test is not yet ap proved or cleared by the [place] FDA and  has been authorized for detection and/or diagnosis of SARS-CoV-2 by FDA under an Emergency Use Authorization (EUA). This EUA will remain  in effect (meaning this test can be used) for the duration of the COVID-19 declaration under Section 564(b)(1) of the Act, 21 U.S.C. section 360bbb-3(b)(1), unless the authorization is terminated or revoked sooner.    Influenza A by PCR NEGATIVE NEGATIVE Final   Influenza B by PCR NEGATIVE NEGATIVE Final    Comment: (NOTE) The Xpert Xpress SARS-CoV-2/FLU/RSV assay is intended as an aid in  the diagnosis of influenza from Nasopharyngeal swab specimens and  should not be used as a sole basis for treatment. Nasal washings and  aspirates are unacceptable for Xpert Xpress  SARS-CoV-2/FLU/RSV  testing. Fact Sheet for Patients: PinkCheek.be Fact Sheet for Healthcare Providers: GravelBags.it This test is not yet approved or cleared by the [place] FDA and  has been authorized for detection and/or diagnosis of SARS-CoV-2 by  FDA under an Emergency Use Authorization (EUA). This EUA will remain  in effect (meaning this test can be used) for the duration of the  Covid-19 declaration under Section 564(b)(1) of the Act, 21  U.S.C. section 360bbb-3(b)(1), unless the authorization is  terminated or revoked. Performed at [facility], [place] [address] [place], [place] [address]   MRSA PCR Screening     Status: Abnormal   Collection Time: 07/23/2019  7:35 AM   Specimen: Nasal Mucosa; Nasopharyngeal  Result Value Ref Range Status   MRSA by PCR POSITIVE (A) NEGATIVE Final    Comment:        The GeneXpert MRSA Assay (FDA approved for NASAL specimens only), is one component of a comprehensive MRSA colonization surveillance program. It is not intended to diagnose MRSA infection nor to guide or monitor treatment for MRSA infections. RESULT CALLED TO, READ BACK BY AND VERIFIED WITH: Mikal Plane RN 9:40 08/05/2019 ([email or web address]) Performed at [facility], [place] [address], [place], [place] [address]   Culture, respiratory (non-expectorated)     Status: None  Collection Time: 07/16/2019  7:38 AM   Specimen: Tracheal Aspirate; Respiratory  Result Value Ref Range Status   Specimen Description TRACHEAL ASPIRATE  Final   Special Requests NONE  Final   Gram Stain   Final    FEW WBC PRESENT, PREDOMINANTLY PMN MODERATE GRAM POSITIVE COCCI IN PAIRS IN CLUSTERS MODERATE GRAM NEGATIVE RODS RARE GRAM NEGATIVE COCCOBACILLI Performed at [facility], [place] [address] [place], [place] [address]    Culture   Final    MODERATE METHICILLIN RESISTANT STAPHYLOCOCCUS AUREUS   Report Status 08/15/2019 FINAL  Final    Organism ID, Bacteria METHICILLIN RESISTANT STAPHYLOCOCCUS AUREUS  Final      Susceptibility   Methicillin resistant staphylococcus aureus - MIC*    CIPROFLOXACIN >=8 RESISTANT Resistant     ERYTHROMYCIN >=8 RESISTANT Resistant     GENTAMICIN <=0.5 SENSITIVE Sensitive     OXACILLIN >=4 RESISTANT Resistant     TETRACYCLINE <=1 SENSITIVE Sensitive     VANCOMYCIN 1 SENSITIVE Sensitive     TRIMETH/SULFA <=10 SENSITIVE Sensitive     CLINDAMYCIN <=0.25 SENSITIVE Sensitive     RIFAMPIN <=0.5 SENSITIVE Sensitive     Inducible Clindamycin NEGATIVE Sensitive     * MODERATE METHICILLIN RESISTANT STAPHYLOCOCCUS AUREUS  Culture, blood (routine x 2)     Status: None   Collection Time: 07/31/2019  9:46 AM   Specimen: BLOOD RIGHT ARM  Result Value Ref Range Status   Specimen Description BLOOD RIGHT ARM  Final   Special Requests   Final    AEROBIC BOTTLE ONLY Blood Culture results may not be optimal due to an inadequate volume of blood received in culture bottles Performed at [facility], [address] [place], [place] [address]    Culture NO GROWTH 5 DAYS  Final   Report Status 08/17/2019 FINAL  Final  Culture, blood (routine x 2)     Status: None   Collection Time: 08/11/2019  9:47 AM   Specimen: BLOOD  Result Value Ref Range Status   Specimen Description BLOOD RIGHT ANTECUBITAL  Final   Special Requests   Final    AEROBIC BOTTLE ONLY Blood Culture results may not be optimal due to an inadequate volume of blood received in culture bottles Performed at [facility], [place] [address] [place], [place] [address]    Culture NO GROWTH 5 DAYS  Final   Report Status 08/17/2019 FINAL  Final    Antimicrobials:   Antibiotics Given (last 72 hours)    Date/Time Action Medication Dose Rate   08/23/19 1124 New Bag/Given  [last hour of HD]   vancomycin (VANCOREADY) IVPB 750 mg/150 mL 750 mg 150 mL/hr   08/23/19 1818 New Bag/Given   ceFEPIme (MAXIPIME) 1 g in sodium chloride 0.9 % 100 mL IVPB  1 g 200 mL/hr   08/24/19 1653 New Bag/Given   ceFEPIme (MAXIPIME) 1 g in sodium chloride 0.9 % 100 mL IVPB 1 g 200 mL/hr   08/25/19 1843 New Bag/Given   ceFEPIme (MAXIPIME) 1 g in sodium chloride 0.9 % 100 mL IVPB 1 g 200 mL/hr     Interim history/subjective:  Vitals stable ON (patient remains on Levophed about 7-9 mcg/min), normotensive, afebrile.  Patient was placed on 2 L nasal cannula for unknown reason around 7:30 PM, no documented desaturation.  Patient hemoglobin 2/11 found to be 6 s/p starting heparin, plan to transfuse 1 unit RBC.  Additionally, HD cath did not work yesterday 2/10, nephrology notified, patient  given TPA.  Remains on Cefepime and Vanc.   Objective   Blood pressure (!) 110/59, pulse 95, temperature 98.3 F (36.8 C), temperature source Oral, resp. rate 13, height 5\' 9"  (1.753 m), weight 93.4 kg, SpO2 100 %.        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  Right lower extremity with 2+ pitting edema, cutaneous abrasions appreciated to patient's thigh, shin, and calf unchanged Neuro: Cranial nerves II-XII grossly intact, patient has no sensation to right foot or toes; no sensation to left BKA stump  Resolved Hospital Problem list   Cardiac arrest Prolonged QTC Acute hypoxic respiratory failure MRSA pneumonia Acute  encephalopathy  Assessment & Plan:  Multifactorial Shock, likely cardiogenic with superimposed septic shock, unchanged:  He continues to have persistent vasopressor requirement. 2/10 ABIs not performed, however Doppler of LEs revealed 2 DVTs to LLE. Patient afebrile ON, vitals stable on ~7-9 mcg/min Levophed, WBC acutely improved 15.7>14.7 (2/11).  The intervention needed to address patient's calciphylaxis or limb necrosis may be too invasive for this patient, who continues to require pressors to maintain stable blood pressure. Ortho and plastics have evaluated patient, offer surgery which patient initially was not interested in. Stated 2/10 he is tired and has been through enough, asked if there was something we could give him to "easy his pain, put him out of his misery". However, today 2/11 patient states he would "like to fight, is interested in getting the surgery", is aware he may not survive it and would like to try anyways.  - Wean norepinephrine as tolerated - Midodrine 10mg  TID - Consulted Orthopedics for input - offered L AKA, R 1-3 toe removal; patient declines surgery - Consulted Plastic Surgery/Wound Care - could address the calciphylaxis however this could make it worse, patient may not heal, do not recommend - Remains on Vancomycin and Cefepime (started 2/7-) - follow CBC   LE DVT: Doppler ultrasound of bilateral lower extremities 2/9 revealed DVTs of the left common femoral vein and left femoral vein.  Patient had not been on DVT prophylaxis due to anemia - Pharmacy consulted, patient started on heparin - signs/symptoms of bleeding - Follow CBC  Calciphylaxis, chronic, stable:  Patientdenies significant pain from his calciphylaxis. Strong suspicion is secondary to patient's comorbidities that he lacks proper sensation to his lower extremities.  Adequate pain controlwith Dilaudid, gabapentin. -Continue gabapentin200mg  BID - Cymbalta 20 mg daily - Dilaudid 4mg  q6 made as  needed (only using 1-2 daily) - Tylenol 650mg  q6 PRN - Continue Sodium Thiosulfate 25mg  IV qMWF with hemodialysis  ESRDon HD MWF: HD 2/10 unable to be performed as HD catheter not working, TPA was administered to catheter. Last had HD M 2/8, was diuresed 3 L. - Nephrology consulted - appreciate recommendations  Anemia of Chronic Disease, acute on chronic, worse:Hgb 7.4>6.0 on 2/11 after starting Heparin. Will transfuse 1u RBC on 2/11. (Patient already transfused  2 units RBC 01/28-01/29, transfused 2 more units RBC on 08/17/2019) - follow CBC -Transfuse Hgb <7 with RBC. - Aranesp IV 162mcg every Saturday  Type 2Diabetes Mellitus:Blood sugar readings more stable 85-149 over the last 24 hours. No occurrences of hypoglycemia. - mSSI  - Levemir 8 units BID - CBGs q4  Best practice:  Diet: Renal/carb modified with fluid restriction Pain/Anxiety/Delirium protocol (if indicated): Dilaudid 4 mg every 6 as needed, Cymbalta 20 mg daily, gabapentin 200 mg nightly VAP protocol (if indicated): None DVT prophylaxis: Heparin GI prophylaxis: None at this time Glucose control: Levemir 8 units twice daily, moderate sliding scale insulin Mobility: Limited secondary to pain/BKA Code Status: DNR Family Communication: None Disposition: Transition to floor once stable  Labs   CBC: Recent Labs  Lab 08/22/19 0946 08/23/19 0202 08/24/19 0240 08/25/19 0541 08/26/19 0344  WBC 16.6* 14.8* 16.0* 15.7* 14.7*  HGB 7.8* 7.5* 7.3* 7.4* 6.0*  HCT 26.1* 25.6* 23.8* 24.6* 20.7*  MCV 91.6 92.4 89.8 89.5 92.4  PLT 260 261 292 368 [id number]    Basic Metabolic Panel: Recent Labs  Lab 08/19/19 1806 08/19/19 1806 08/19/19 2228 08/19/19 2228 08/20/19 0345 08/20/19 0730 08/20/19 0911 08/21/19 0443 08/21/19 1125 08/21/19 2230 08/22/19 0427 08/22/19 0946 08/22/19 2300 08/23/19 0202 08/23/19 1349 08/23/19 2300 08/24/19 0232 08/24/19 0240 08/24/19 1121 08/24/19 2314 08/25/19 1101 08/25/19 2321   NA 137   < > 139   < >  --  139  --  140  --   --   --  140  --  136  --   --   --  139  --   --   --   --   K 4.3   < > 4.0   < >  --  4.2  --  4.2  --   --   --  4.6  --  4.5  --   --   --  4.6  --   --   --   --   CL 100   < > 102   < >  --  100  --  102  --   --   --  99  --  93*  --   --   --  95*  --   --   --   --   CO2 18*   < > 17*   < >  --  17*  --  19*  --   --   --  18*  --  20*  --   --   --  18*  --   --   --   --   GLUCOSE 168*   < > 160*   < >  --  133*   < > 123*   < >   < >  --  153*   < > 139*   < >   < >  --  154* 132* 129* 142* 98  BUN 50*   < > 50*   < >  --  57*  --  42*  --   --   --  64*  --  75*  --   --   --  78*  --   --   --   --   CREATININE 3.66*   < > 3.77*   < >  --  3.87*  --  3.62*  --   --   --  5.19*  --  5.92*  --   --   --  5.96*  --   --   --   --   CALCIUM 7.4*   < > 7.5*   < >  --  8.0*  --  7.4*  --   --   --  7.7*  --  7.6*  --   --   --  8.0*  --   --   --   --   MG  --   --  2.5*  --  2.4  --   --  2.2  --   --  2.4  --   --   --   --   --  2.3  --   --   --   --   --   PHOS 3.9  --   --   --   --  4.7*  --   --   --   --   --  4.7*  --  4.8*  --   --   --   --   --   --   --   --    < > = values in this interval not displayed.   GFR: Estimated Creatinine Clearance: 15.8 mL/min (A) (by C-G formula based on SCr of 5.96 mg/dL (H)). Recent Labs  Lab 08/23/19 0202 08/24/19 0240 08/25/19 0541 08/26/19 0344  WBC 14.8* 16.0* 15.7* 14.7*    Liver Function Tests: Recent Labs  Lab 08/19/19 1806 08/20/19 0730 08/22/19 0946 08/23/19 0202 08/24/19 0240  AST  --   --   --   --  15  ALT  --   --   --   --  20  ALKPHOS  --   --   --   --  127*  BILITOT  --   --   --   --  0.6  PROT  --   --   --   --  5.9*  ALBUMIN 1.7* 2.0* 2.1* 2.0* 1.9*   No results for input(s): LIPASE, AMYLASE in the last 168 hours. No results for input(s): AMMONIA in the last 168 hours.  ABG    Component Value Date/Time   PHART 7.422 08/16/2019 1520   PCO2ART 33.7  08/16/2019 1520   PO2ART 177.0 (H) 08/16/2019 1520   HCO3 21.9 08/16/2019 1520   TCO2 23 08/16/2019 1520   ACIDBASEDEF 2.0 08/16/2019 1520   O2SAT 100.0 08/16/2019 1520     Coagulation Profile: No results for input(s): INR, PROTIME in the last 168 hours.  Cardiac Enzymes: No results for input(s): CKTOTAL, CKMB, CKMBINDEX, TROPONINI in the last 168 hours.  HbA1C: Hgb A1c MFr Bld  Date/Time Value Ref Range Status  07/27/2019 07:30 PM 5.6 4.8 - 5.6 % Final    Comment:    (NOTE) Pre diabetes:          5.7%-6.4% Diabetes:              >6.4% Glycemic control for   <7.0% adults with diabetes     CBG: Recent Labs  Lab 08/25/19 1136 08/25/19 1532 08/25/19 1936 08/25/19 2315 08/26/19 0643  GLUCAP 114* 149* 86 93 85    Review of Systems:   See HPI  Past Medical History  He,  has a past medical history of Anemia due to chronic kidney disease, Cardiac arrest ([place]) (07/17/2019), End-stage renal disease on hemodialysis ([place]), and Personal history of MRSA (methicillin resistant Staphylococcus aureus) (07/2020).   Surgical History  Past Surgical History:  Procedure Laterality Date   IR FLUORO GUIDE CV LINE LEFT  08/19/2019   IR US GUIDE VASC ACCESS LEFT  08/19/2019     Social History   reports that he has been smoking cigarettes. He has never used smokeless tobacco. He reports current alcohol use. He reports current drug use. Drug: Marijuana.   Family History   His family history is not on file.   Allergies Allergies  Allergen Reactions   Amlodipine Itching   Dacarbazine And Related Nausea And Vomiting   Fentanyl Itching   Hydrocodone Nausea And Vomiting   Propofol Itching     Home Medications  Prior to Admission medications   Medication Sig Start Date End Date Taking? Authorizing Provider  b complex-vitamin c-folic acid (NEPHRO-VITE) 0.8 MG TABS tablet Take 1 tablet by mouth daily. 05/18/19   [provider]  calcium acetate (PHOSLO) 667 MG tablet  Take 1,334 mg by mouth 3 (three) times daily. 07/21/19   [provider]  COLCRYS 0.6 MG tablet Take 0.6 mg by mouth daily as needed (gout flare).  03/24/19   [provider]  hydrALAZINE (APRESOLINE) 25 MG tablet Take 37.5 mg by mouth 3 (three) times daily. 03/29/19   [provider]  isosorbide mononitrate (IMDUR) 30 MG 24 hr tablet Take 30 mg by mouth daily. 03/29/19   [provider]  LEVEMIR FLEXTOUCH 100 UNIT/ML Pen Inject 25 Units into the skin daily. 07/30/19   [provider]  metoprolol succinate (TOPROL-XL) 50 MG 24 hr tablet Take 50 mg by mouth daily. 03/29/19   [provider]  NOVOLIN 70/30 FLEXPEN (70-30) 100 UNIT/ML PEN Inject 20 Units into the skin daily. 03/11/19   [provider]  NOVOLOG FLEXPEN 100 UNIT/ML FlexPen Inject 1 Units into the skin at bedtime. 07/30/19   [provider]  oxyCODONE-acetaminophen (PERCOCET) 7.5-325 MG tablet Take 1 tablet by mouth every 4 (four) hours as needed. 07/26/19   [provider]  oxyCODONE-acetaminophen (PERCOCET/ROXICET) 5-325 MG tablet Take 1 tablet by mouth 4 (four) times daily as needed. 07/19/19   [provider]  pantoprazole (PROTONIX) 40 MG tablet Take 40 mg by mouth daily. 06/23/19   [provider]  RENVELA 800 MG tablet Take 2,400 mg by mouth 3 (three) times daily. 03/11/19   [provider]  rosuvastatin (CRESTOR) 5 MG tablet Take 5 mg by mouth daily. 03/11/19   [provider]  TRULICITY [id number] SOPN Inject 1 mg into the skin once a week. 07/30/19   [provider]  VELPHORO 500 MG chewable tablet Chew 500 mg by mouth 3 (three) times daily. 06/08/19   [provider]  VENTOLIN HFA 108 (90 Base) MCG/ACT inhaler Inhale 2 puffs into the lungs 4 (four) times daily as needed for wheezing or shortness of breath. 06/23/19   [provider]     Critical care time: [place], [place], PGY-2 08/26/2019 9:27 AM

## 2019-08-26 NOTE — Progress Notes (Signed)
Paged Dr. Sharol Given at palliative NP's request. He will come to bedside to discuss surgery when he is available. Will notify patient as well.

## 2019-08-26 NOTE — Progress Notes (Signed)
[facility] Progress Note  Subjective: seen by pall care, wants to "fight" for now, cont medical care  Vitals:   08/26/19 1108 08/26/19 1130 08/26/19 1200 08/26/19 1230  BP:  119/73 105/72 (!) 96/59  Pulse:   95   Resp:  12 12 (!) 25  Temp: 98.8 F (37.1 C)     TempSrc: Oral     SpO2:  96% 94% 98%  Weight:      Height:        Exam: General: awake, alert, comfortable HEENT: MMM Eyes: PERRL Cor: RRR no RG Lungs: Coarse BS, normal WOB Abdomen: Soft obese Extremities: 2+ RLE edema  Skin: large necrotic wound left lateral upper leg w/ sig tissue loss; distal L BKA stump tissue necrotic also gangrene of the penis Neuro: responding to conversation appropriately.  Access right femoral TDC in place with dressing c/d/i  [place] MWF   R fem TDC   450/800  2/2 bath  86kg  Hep 6K + 1K mid -mircera 200 mcg every 2 weeks last on 1/16 -Sodium thiosulfate 25 gram every treatment  -Weekly venofer -parsabiv 2.5 mg three times a week    CXR 1/28 - no active disease  Assessment/Plan:  # Cardiac arrest/ found down - Supportive care per pulm critical care--> improved  # Acute hypoxic respiratory failure - resolved  # End-stage renal disease - sp CRRT 1/28 > 08/18/19 -  MWF schedule  -  midodrine 10 TID for soft BP's - requiring low dose pressors still - vol up about 7kg - TPA'd the HD cath 2/10 yest - HD today off schedule  # Shock - d/t sepsis,  MRSA growing resp cx, bcx's negative on IV abx - Improved  -Completed course of vanc 2/5  # Anemia acute on chronic - Status post PRBCs - last mircera was on 1/30 at 150 mcg -Hb 7-8s now and intermittent transfusions have been required..   # Acute encephalopathy -  s/p arrest,  also with report of seizure activity and s/p midazolam  - improved  # Calciphylaxis - major wound L lat thigh, also penile gangrene - cont tiw sodium thiosulfate  # Secondary hyperparathyroidism  - No activated vit D or Ca++ products  with calciphylaxis - Resumed sensipar 30 daily 2/3 here parsabiv not available  Kelly Splinter, MD 08/26/2019, 12:49 PM        Inpatient medications: . sodium chloride   Intravenous Once  . Chlorhexidine Gluconate Cloth  6 each Topical Q0600  . cinacalcet  30 mg Oral Q breakfast  . darbepoetin (ARANESP) injection - DIALYSIS  150 mcg Intravenous Q Sat-HD  . DULoxetine  20 mg Oral Daily  . feeding supplement (NEPRO CARB STEADY)  237 mL Oral TID BM  . feeding supplement (PRO-STAT SUGAR FREE 64)  30 mL Oral BID  . gabapentin  200 mg Oral QHS  . insulin aspart  0-15 Units Subcutaneous TID WC  . insulin detemir  8 Units Subcutaneous BID  . midodrine  10 mg Oral TID WC  . multivitamin  1 tablet Oral QHS  . sodium chloride flush  10-40 mL Intracatheter Q12H   . sodium chloride    . ceFEPime (MAXIPIME) IV Stopped (08/25/19 1913)  . norepinephrine (LEVOPHED) Adult infusion 5 mcg/min (08/26/19 1200)  . sodium thiosulfate infusion for calciphylaxis 25 g (08/23/19 1114)  . vancomycin 750 mg (08/23/19 1124)   sodium chloride, acetaminophen, albuterol, bisacodyl, heparin, HYDROmorphone, hydrOXYzine, influenza vac split quadrivalent PF, lidocaine (PF), lidocaine (PF),  lidocaine-prilocaine, ondansetron (ZOFRAN) IV, pentafluoroprop-tetrafluoroeth, pneumococcal 23 valent vaccine, polyethylene glycol, senna-docusate, sodium chloride flush Recent Labs  Lab 08/22/19 0946 08/22/19 2300 08/23/19 0202 08/23/19 1349 08/24/19 0240 08/24/19 1121 08/25/19 1101 08/25/19 2321  NA 140  --  136  --  139  --   --   --   K 4.6  --  4.5  --  4.6  --   --   --   CL 99  --  93*  --  95*  --   --   --   CO2 18*  --  20*  --  18*  --   --   --   GLUCOSE 153*   < > 139*   < > 154*   < > 142* 98  BUN 64*  --  75*  --  78*  --   --   --   CREATININE 5.19*  --  5.92*  --  5.96*  --   --   --   CALCIUM 7.7*  --  7.6*  --  8.0*  --   --   --   PHOS 4.7*  --  4.8*  --   --   --   --   --    < > = values in this  interval not displayed.

## 2019-08-26 NOTE — Progress Notes (Signed)
[facility] [place] Progress Note Patient Name: [name] DOB: [birth year] MRN: [id number]   Date of Service  08/26/2019  HPI/Events of Note  Hemoglobin 6.0  eICU Interventions  Transfuse 1 unit PRBC        Ronnika Collett U Nohea Kras 08/26/2019, 5:07 AM

## 2019-08-27 ENCOUNTER — Inpatient Hospital Stay (HOSPITAL_COMMUNITY): Payer: Medicare Other

## 2019-08-27 DIAGNOSIS — I5021 Acute systolic (congestive) heart failure: Secondary | ICD-10-CM

## 2019-08-27 DIAGNOSIS — I739 Peripheral vascular disease, unspecified: Secondary | ICD-10-CM

## 2019-08-27 DIAGNOSIS — R0989 Other specified symptoms and signs involving the circulatory and respiratory systems: Secondary | ICD-10-CM

## 2019-08-27 DIAGNOSIS — I9589 Other hypotension: Secondary | ICD-10-CM

## 2019-08-27 LAB — CBC
HCT: 26.3 % — ABNORMAL LOW (ref 39.0–52.0)
Hemoglobin: 8.4 g/dL — ABNORMAL LOW (ref 13.0–17.0)
MCH: 28 pg (ref 26.0–34.0)
MCHC: 31.9 g/dL (ref 30.0–36.0)
MCV: 87.7 fL (ref 80.0–100.0)
Platelets: 353 10*3/uL (ref 150–400)
RBC: 3 MIL/uL — ABNORMAL LOW (ref 4.22–5.81)
RDW: 19.4 % — ABNORMAL HIGH (ref 11.5–15.5)
WBC: 21.7 10*3/uL — ABNORMAL HIGH (ref 4.0–10.5)
nRBC: 0 % (ref 0.0–0.2)

## 2019-08-27 LAB — BPAM RBC
Blood Product Expiration Date: 202103102359
ISSUE DATE / TIME: 202102110631
Unit Type and Rh: 6200

## 2019-08-27 LAB — TYPE AND SCREEN
ABO/RH(D): A POS
Antibody Screen: NEGATIVE
Unit division: 0

## 2019-08-27 LAB — GLUCOSE, CAPILLARY
Glucose-Capillary: 76 mg/dL (ref 70–99)
Glucose-Capillary: 120 mg/dL — ABNORMAL HIGH (ref 70–99)
Glucose-Capillary: 130 mg/dL — ABNORMAL HIGH (ref 70–99)
Glucose-Capillary: 121 mg/dL — ABNORMAL HIGH (ref 70–99)
Glucose-Capillary: 125 mg/dL — ABNORMAL HIGH (ref 70–99)
Glucose-Capillary: 65 mg/dL — ABNORMAL LOW (ref 70–99)

## 2019-08-27 LAB — BASIC METABOLIC PANEL
Anion gap: 23 — ABNORMAL HIGH (ref 5–15)
BUN: 60 mg/dL — ABNORMAL HIGH (ref 6–20)
CO2: 20 mmol/L — ABNORMAL LOW (ref 22–32)
Calcium: 7.9 mg/dL — ABNORMAL LOW (ref 8.9–10.3)
Chloride: 96 mmol/L — ABNORMAL LOW (ref 98–111)
Creatinine, Ser: 5.03 mg/dL — ABNORMAL HIGH (ref 0.61–1.24)
GFR calc Af Amer: 14 mL/min — ABNORMAL LOW (ref >60)
GFR calc non Af Amer: 12 mL/min — ABNORMAL LOW (ref >60)
Glucose, Bld: 149 mg/dL — ABNORMAL HIGH (ref 70–99)
Potassium: 4.3 mmol/L (ref 3.5–5.1)
Sodium: 139 mmol/L (ref 135–145)

## 2019-08-27 LAB — COOXEMETRY PANEL
Carboxyhemoglobin: 2.7 % — ABNORMAL HIGH (ref 0.5–1.5)
Methemoglobin: 1 % (ref 0.0–1.5)
O2 Saturation: 73.4 %
Total hemoglobin: 8.5 g/dL — ABNORMAL LOW (ref 12.0–16.0)

## 2019-08-27 MED ORDER — SODIUM THIOSULFATE 25 % IV SOLN
25.0000 g | INTRAVENOUS | Status: DC
  Filled 2019-08-27: qty 100

## 2019-08-27 MED ORDER — VANCOMYCIN HCL IN DEXTROSE 750-5 MG/150ML-% IV SOLN
750.0000 mg | INTRAVENOUS | Status: DC
  Filled 2019-08-27: qty 150

## 2019-08-27 MED ORDER — SODIUM THIOSULFATE 25 % IV SOLN: 25.0000 g | INTRAVENOUS | Status: DC

## 2019-08-27 MED ORDER — CHLORHEXIDINE GLUCONATE CLOTH 2 % EX PADS
6.0000 | MEDICATED_PAD | Freq: Every day | CUTANEOUS | Status: DC
  Administered 2019-08-27: 6 via TOPICAL

## 2019-08-27 MED ORDER — ROSUVASTATIN CALCIUM 5 MG PO TABS
5.0000 mg | ORAL_TABLET | Freq: Every day | ORAL | Status: DC
  Administered 2019-08-27 – 2019-08-28 (×2): 5 mg via ORAL
  Filled 2019-08-27 (×2): qty 1

## 2019-08-27 MED ORDER — VANCOMYCIN HCL 750 MG/150ML IV SOLN: 750.0000 mg | INTRAVENOUS | Status: DC

## 2019-08-27 NOTE — Progress Notes (Signed)
Patient ID: [name], male   DOB: 02/22/1966, 54 y.o.   MRN: [id number] Patient is seen in follow-up for gangrenous changes both lower extremities.  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  Patient's left lower extremity is more complicated.  His entire left leg from the groin to the end of the transtibial amputation is cold and ischemic.  He has necrotic skin in the gluteus buttocks area as well as all the way up to the groin.  This is the skin that would be necessary to be healthy and viable for a hip disarticulation.  At this point patient does not have any healthy tissue to consider a hip disarticulation.  I discussed this with the patient and his wife and daughter were on the phone.  I recommended continuing his antibiotics and I will follow up on Thursday to see if there is any improvement in the soft tissue to see if surgical intervention is an option.  If patient were to proceed with surgery he would not require a above-the-knee amputation on the right and a hip disarticulation on the left.  Patient is currently on pressor medications and does not have a viable soft tissue envelope to consider hip disarticulation on the left.

## 2019-08-27 NOTE — Progress Notes (Signed)
ABI and right lower extremity arterial duplex completed. Refer to "CV Proc" under chart review to view preliminary results.  08/27/2019 12:27 PM Kelby Aline., MHA, RVT, RDCS, RDMS

## 2019-08-27 NOTE — Progress Notes (Signed)
[facility] Progress Note  Subjective: seen in room  Vitals:   08/27/19 0645 08/27/19 0700 08/27/19 0715 08/27/19 0729  BP: 98/60 106/61 (!) 89/54   Pulse:      Resp: 16 16 13    Temp:    99.4 F (37.4 C)  TempSrc:    Axillary  SpO2: 96% 96% 96%   Weight:      Height:        Exam: General: awake, alert, comfortable HEENT: MMM Eyes: PERRL Cor: RRR no RG Lungs: Coarse BS, normal WOB Abdomen: Soft obese Extremities: 2+ RLE edema  Skin: large necrotic wound left lateral upper leg w/ sig tissue loss; distal L BKA stump tissue necrotic also gangrene of the penis Neuro: responding to conversation appropriately.  Access right femoral TDC in place with dressing c/d/i  [place] MWF   R fem TDC   450/800  2/2 bath  86kg  Hep 6K + 1K mid -mircera 200 mcg every 2 weeks last on 1/16 -Sodium thiosulfate 25 gram every treatment  -Weekly venofer -parsabiv 2.5 mg three times a week    CXR 1/28 - no active disease  Assessment/Plan:  # Cardiac arrest/ found down - Supportive care per pulm critical care--> improved  # Acute hypoxic respiratory failure - resolved  # End-stage renal disease - sp CRRT 1/28 > 08/18/19 -  MWF schedule  -  midodrine 10 TID for soft BP's - requiring low dose pressors still - vol excess, up 5- 10kg - TPA'd the HD cath 2/10, worked well yesterday - next HD Sat off sched then resume MWF  # Shock - d/t sepsis,  MRSA growing resp cx, bcx's negative on IV abx  - 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 Chlorhexidine Gluconate Cloth  6 each Topical Q0600  . cinacalcet  30 mg Oral Q breakfast  . darbepoetin (ARANESP) injection - DIALYSIS  150 mcg Intravenous Q Sat-HD  . DULoxetine  20 mg Oral Daily  . feeding supplement (NEPRO CARB STEADY)  237 mL Oral TID BM  . feeding supplement (PRO-STAT SUGAR FREE 64)  30 mL Oral BID  . gabapentin  200 mg Oral QHS  . insulin aspart  0-15 Units Subcutaneous TID WC  . insulin detemir  8 Units Subcutaneous BID  . midodrine  10 mg Oral TID WC  . multivitamin  1 tablet Oral QHS  . rosuvastatin  5 mg Oral Daily  . sodium chloride flush  10-40 mL Intracatheter Q12H   . sodium chloride    . ceFEPime (MAXIPIME) IV Stopped (08/26/19 1744)  . norepinephrine (LEVOPHED) Adult infusion 20  mcg/min (08/27/19 0734)  . sodium thiosulfate infusion for calciphylaxis 25 g (08/27/19 0000)  . vancomycin 750 mg (08/23/19 1124)   sodium chloride, acetaminophen, albuterol, bisacodyl, heparin, heparin, HYDROmorphone, hydrOXYzine, influenza vac split quadrivalent PF, lidocaine (PF), lidocaine-prilocaine, ondansetron (ZOFRAN) IV, pentafluoroprop-tetrafluoroeth, pneumococcal 23 valent vaccine, polyethylene glycol, senna-docusate, sodium chloride flush Recent Labs  Lab 08/22/19 0946 08/22/19 2300 08/23/19 0202 08/23/19 1349 08/26/19 1143 08/27/19 0557  NA 140  --  136   < > 138 139  K 4.6  --  4.5   < > 5.1 4.3  CL 99  --  93*   < > 96* 96*  CO2 18*  --  20*   < > 19* 20*  GLUCOSE 153*   < > 139*   < > 129* 149*  BUN 64*  --  75*   < > 108* 60*  CREATININE 5.19*  --  5.92*   < > 7.76* 5.03*   CALCIUM 7.7*  --  7.6*   < > 8.0* 7.9*  PHOS 4.7*  --  4.8*  --   --   --    < > = values in this interval not displayed.

## 2019-08-27 NOTE — Progress Notes (Signed)
Daily Progress Note   Patient Name: [name]       Date: 08/27/2019 DOB: 12-May-1966  Age: 54 y.o. MRN#: [id number] Attending Physician: Chesley Mires, MD Primary Care Physician: [facility] Medical Admit Date: 07/26/2019  Reason for Consultation/Follow-up: Establishing goals of care  Subjective: Patient awake, alert, oriented and able to participate in discussion.    GOC: 1010: Dr. Ouida Jefferson and I met with patient at bedside this morning. Patient recently spoke with Dr. Sharol Jefferson. Patient updates [place] on this conversation that Dr. Sharol Jefferson will not do surgery in the next couple days but continuing antibiotics and evaluate next Thursday (today is Friday 2/12). Dr [name] and I tried to compassionately share that with multiple underlying conditions, it is unlikely his condition will change drastically by next Thursday, especially with increase requirement of vasopressor support and that he has been on IV antibiotics throughout admission. Reassured of ongoing discussions with him, care team, and family.  This afternoon, spoke with wife [name] via telephone. Discussed course of hospitalization including diagnoses, interventions, plan of care. Updated her on increase in vasopressor support during dialysis yesterday and ongoing tenuous clinical condition. [name] and their daughter was present on the phone when [name] spoke with Dr. Sharol Jefferson this morning. [name] was surprised to hear recommendation to wait until Thursday and shares her belief of what will change by Thursday?? 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 She shares that she doesn't want to see him in pain and knows he is having significant pain with calciphylaxis.   Discussed calciphylaxis in detail and extremely high risk for morbidity and mortality. Also with underlying acute heart failure and ESRD on hemodialysis.   Reassured of ongoing support from PMT with these tough decisions. [name] and I have arranged a meeting including his children [name] and [name]) for tomorrow 2/13 at [facility]. [name] has PMT contact information.   Length of Stay: 15  Current Medications: Scheduled Meds:  . Chlorhexidine Gluconate Cloth  6 each Topical Q0600  . Chlorhexidine Gluconate Cloth  6 each Topical Q0600  . cinacalcet  30 mg Oral Q breakfast  . darbepoetin (ARANESP) injection - DIALYSIS  150 mcg Intravenous Q Sat-HD  . DULoxetine  20 mg Oral Daily  . feeding supplement (NEPRO CARB STEADY)  237 mL Oral TID BM  . feeding supplement (PRO-STAT SUGAR FREE 64)  30 mL Oral BID  . gabapentin  200 mg Oral QHS  . insulin aspart  0-15 Units Subcutaneous TID WC  . insulin detemir  8 Units Subcutaneous BID  . midodrine  10 mg Oral TID WC  . multivitamin  1 tablet Oral QHS  . rosuvastatin  5 mg Oral Daily  . sodium chloride flush  10-40 mL Intracatheter Q12H    Continuous Infusions: . sodium chloride    . ceFEPime (MAXIPIME) IV Stopped (08/26/19 1744)  . norepinephrine (LEVOPHED) Adult infusion 20 mcg/min (08/27/19 0734)  . sodium thiosulfate infusion for calciphylaxis 25 g (08/27/19 0000)  . vancomycin 750 mg (08/23/19 1124)    PRN Meds: sodium chloride, acetaminophen, albuterol, bisacodyl, heparin, heparin, HYDROmorphone, hydrOXYzine, influenza vac split quadrivalent PF, lidocaine (PF), lidocaine-prilocaine, ondansetron (ZOFRAN) IV, pentafluoroprop-tetrafluoroeth, pneumococcal 23 valent vaccine, polyethylene glycol, senna-docusate, sodium chloride flush  Physical Exam Vitals and nursing note reviewed.    Constitutional:      General: He is awake.     Appearance: He is ill-appearing.  HENT:     Head: Normocephalic and atraumatic.  Cardiovascular:     Rate and Rhythm: Normal rate.  Pulmonary:     Effort: No tachypnea, accessory muscle usage or respiratory distress.  Abdominal:     Tenderness: There is no abdominal tenderness.  Skin:    Comments: Left BKA, ischemia. Right gangrenous toes  Neurological:     Mental Status: He is alert and oriented to person, place, and time.             Vital Signs: BP (!) 89/54   Pulse (!) 122   Temp 99.4 F (37.4 C) (Axillary)   Resp 13   Ht _0  (1.753 m)   Wt 98.1 kg   SpO2 96%   BMI 31.94 kg/m  SpO2: SpO2: 96 % O2 Device: O2 Device: Nasal Cannula O2 Flow Rate: O2 Flow Rate (L/min): 1 L/min  Intake/output summary:   Intake/Output Summary (Last 24 hours) at 08/27/2019 1033 Last data filed at 08/27/2019 0700 Gross per 24 hour  Intake 567.21 ml  Output 482 ml  Net 85.21 ml   LBM: Last BM Date: 08/27/19 Baseline Weight: Weight: 87.9 kg Most recent weight: Weight: 98.1 kg       Palliative Assessment/Data: PPS 40%    Flowsheet Rows     Most Recent Value  Intake Tab  Referral Department  Critical care  Unit at Time of Referral  ICU  Palliative Care Primary Diagnosis  Other (Comment)  Palliative Care Type  New Palliative care  Reason for referral  Clarify Goals of Care  Date first seen by Palliative Care  08/26/19  Clinical Assessment  Palliative Performance Scale Score  40%  Psychosocial & Spiritual Assessment  Palliative Care Outcomes  Patient/Family meeting held?  Yes  Who was at the meeting?  patient and wife  Palliative Care Outcomes  Clarified goals of care, Provided end of life care assistance, Provided psychosocial or spiritual support, ACP counseling assistance, Improved pain interventions      Patient Active Problem List   Diagnosis Date Noted  . Palliative care by specialist   . Goals of care,  counseling/discussion   . Gangrene of toe of  right foot ([place])   . Atherosclerosis of native arteries of extremities with gangrene, left leg ([place])   . Acute respiratory failure ([place])   . Cardiac arrest ([place]) 08/15/2019  . End-stage renal disease on hemodialysis ([place])   . Hyperkalemia   . Anemia due to chronic kidney disease, on chronic dialysis ([place])   . Encephalopathy acute   . Calciphylaxis     Palliative Care Assessment & Plan   Patient Profile: 54 y.o. male  with past medical history of ESRD on hemodialysis, left BKA, DM type 2, MRSA, anemia of chronic disease admitted on 08/07/2019 with witnessed cardiac arrest, ROSC after 3 minutes. Patient required intubation and vasopressor support. Found to have MRSA pneumonia, treated with vancomycin. Extubated 08/18/19. Hospitalization complicated by ongoing hypotension requiring pressors concerning for new source of infection.  CT L femur 2/7 showed heavily calcified vessels especially of superficial femoral artery, negative for abscess or osteomyelitis. Patient with cold gangrenous right foot and cold gangrenous left BKA. Remains on norepinephrine, vancomycin and cefepime. DVT's left common femoral vein and left femoral vein. Ortho and plastic surgery consulted for calciphylaxis/limb necrosis. Dr. Sharol Jefferson discussed with patient potential for above-the-knee amputation on the right with possible revision amputation on the left. Palliative medicine consultation for goals of care.   Assessment: Shock, likely cardiogenic superimposed on septic shock Calciphylaxis ESRD on hemodialysis Acute systolic CHF LE DVT Anemia of chronic disease MRSA PNA Cardiac arrest with ROSC Poorly controlled DM type II  Recommendations/Plan:  DNR, otherwise continue current plan of care and medical management  Ortho follow-up this AM: Dr. Sharol Jefferson recommending continued antibiotics with plan to f/u Thursday, 2/18 to see if there is any improvement in soft tissue and if  surgical intervention is an option.   Ongoing palliative discussions. Plan to meet with patient, wife, and two children 2/13 at 11am for further [place] discussion.  Code Status: DNR   Code Status Orders  (From admission, onward)         Start     Ordered   08/25/19 1320  Do not attempt resuscitation (DNR)  Continuous    Question Answer Comment  In the event of cardiac or respiratory ARREST Do not call a "code blue"   In the event of cardiac or respiratory ARREST Do not perform Intubation, CPR, defibrillation or ACLS   In the event of cardiac or respiratory ARREST Use medication by any route, position, wound care, and other measures to relive pain and suffering. May use oxygen, suction and manual treatment of airway obstruction as needed for comfort.      08/25/19 1319        Code Status History    Date Active Date Inactive Code Status Order ID Comments User Context   08/07/2019 1811 08/25/2019 1319 DNR [id number]  Kipp Brood, MD Inpatient   07/24/2019 0358 08/05/2019 1811 Full Code [id number]  Arnell Asal, NP ED   Advance Care Planning Activity       Prognosis:  Poor prognosis  Discharge Planning:  To Be Determined  Care plan was discussed with patient, Dr. Ouida Sills, RN, wife ([name])  Thank you for allowing the Palliative Medicine Team to assist in the care of this patient.   Time In: 1000- 1305- Time Out: 1030 1335  Total Time 60 Prolonged Time Billed  no      Greater than 50%  of this time was spent counseling and coordinating care related to the above assessment and plan.  Ihor Dow, DNP, FNP-C Palliative  Medicine Team  Phone: [phone] Fax: [phone]  Please contact Palliative Medicine Team phone at [phone] for questions and concerns.

## 2019-08-27 NOTE — Progress Notes (Signed)
No surgery today. Patient no longer NPO.  Milus Banister, [place], PGY-2 08/27/2019 11:11 AM

## 2019-08-27 NOTE — Progress Notes (Signed)
Occupational Therapy Treatment Patient Details Name: RAAD BAJEMA MRN: [id number] DOB: 1966-03-07 Today's Date: 08/27/2019    History of present illness 54 y.o. male with history of ESRD on HD with witnessed cardiac arrest, obtained ROSC after 3 minutes.  Required intubation for ongoing airway protection, ongoing unresponsiveness, and hypotension requiring vasopressor support.  Also with MRSA pneumonia on vancomycin.  Patient extubated 08/18/2019.   OT comments  Focus of session on UE exercise with level 3 theraband and bed mobility for pressure relief. Tolerated well, but buttocks pain prevents him from being able to sit with bed in chair position. Will continue to follow.  Follow Up Recommendations  SNF;Supervision/Assistance - 24 hour    Equipment Recommendations       Recommendations for Other Services      Precautions / Restrictions Precautions Precautions: Fall Precaution Comments: perm R fem HD cath        Mobility Bed Mobility     Rolling: Max assist         General bed mobility comments: rolled for pressure relief toward R  Transfers                      Balance                                           ADL either performed or assessed with clinical judgement   ADL                                               Vision       Perception     Praxis      Cognition Arousal/Alertness: Awake/alert Behavior During Therapy: Flat affect Overall Cognitive Status: Impaired/Different from baseline                               Problem Solving: Slow processing;Decreased initiation;Requires verbal cues          Exercises Exercises: General Upper Extremity General Exercises - Upper Extremity Shoulder Flexion: Strengthening;10 reps;Supine;Theraband;Both Theraband Level (Shoulder Flexion): Level 3 (Green) Shoulder Extension: Strengthening;Both;10 reps;Supine;Theraband Theraband Level  (Shoulder Extension): Level 3 (Green) Shoulder Horizontal ABduction: Strengthening;Both;10 reps;Supine;Theraband Theraband Level (Shoulder Horizontal Abduction): Level 3 (Green) Elbow Flexion: Strengthening;Both;10 reps;Supine;Theraband Theraband Level (Elbow Flexion): Level 3 (Green) Elbow Extension: Strengthening;Both;10 reps;Supine;Theraband Theraband Level (Elbow Extension): Level 3 (Green)   Shoulder Instructions       General Comments      Pertinent Vitals/ Pain       Pain Assessment: Faces Faces Pain Scale: Hurts even more Pain Location: buttocks with placing bed in chair position Pain Descriptors / Indicators: Grimacing  Home Living                                          Prior Functioning/Environment              Frequency  Min 2X/week        Progress Toward Goals  OT Goals(current goals can now be found in the care plan section)  Progress towards OT goals: Progressing toward goals  Acute Rehab  OT Goals Patient Stated Goal: to get better OT Goal Formulation: With patient Time For Goal Achievement: 09/04/19 Potential to Achieve Goals: [place] Discharge plan remains appropriate    Co-evaluation                 AM-PAC OT "6 Clicks" Daily Activity     Outcome Measure   Help from another person eating meals?: None Help from another person taking care of personal grooming?: A Little Help from another person toileting, which includes using toliet, bedpan, or urinal?: Total Help from another person bathing (including washing, rinsing, drying)?: A Lot Help from another person to put on and taking off regular upper body clothing?: A Little Help from another person to put on and taking off regular lower body clothing?: Total 6 Click Score: 14    End of Session    OT Visit Diagnosis: Pain;Muscle weakness (generalized) (M62.81)   Activity Tolerance Patient tolerated treatment well   Patient Left in bed;with call bell/phone  within reach;with bed alarm set(Dr Sharol Given in room)   Nurse Communication          Time: [phone] OT Time Calculation (min): 18 min  Charges: OT General Charges $OT Visit: 1 Visit OT Treatments $Therapeutic Exercise: 8-22 mins  Nestor Lewandowsky, OTR/L Acute Rehabilitation Services Pager: [phone] Office: [phone] Malka So 08/27/2019, 11:58 AM

## 2019-08-27 NOTE — Consult Note (Addendum)
Advanced Heart Failure Team Consult Note   Primary Physician: [facility] PCP-Cardiologist:  No primary care provider on file.  Reason for Consultation: Heart Failure   HPI:    [name] is seen today for evaluation of heart failure and hypotension at the request of Dr Halford Chessman.   [name] is a 54 year old with a history of ESRD (since 2005), calciphylaxis, DMII, and anemia . No history of coronary disease.   Admitted after unwitnessed cardiac arrest. Initially intubated and on multiple pressors. Respiratory arrest thought to be from MRSA PNA. Placed on antibiotics. Blood cultures negative.  ECHO completed and showed EF 30-35%. Nephrology consulted for ongoing dialysis.  Gradually weaned off pressors and extubated on 2/3. On 2/9 left leg was cool and doppler studies showed DVT in L common femoral venl and left superficial vein. Ortho consulted and for ischemic LLE. Due to severity would need left hip disarticulation but not a candidate due to pressors and not enough viable soft tissue to cover.   Yesterday hgb dropped to 6. Transfused 1UPRBCs with appropriate rise to 8.4. Unable to wean Norepi. Currently on 12 mcg and started on midodrine 10 mg three times a day. Todays CO-OX is 73%.    Says he thinks had heart cath 20 years ago and was "ok   Echo 08/13/19 EF 30-35%.   Review of Systems: [y] = yes, [ ]  = no   . General: Weight gain [ ] ; Weight loss [ ] ; Anorexia [ ] ; Fatigue [ Y]; Fever [ ] ; Chills [ ] ; Weakness [ ]   . Cardiac: Chest pain/pressure [ ] ; Resting SOB [ ] ; Exertional SOB [ ] ; Orthopnea [ ] ; Pedal Edema [ ] ; Palpitations [ ] ; Syncope [ ] ; Presyncope [ ] ; Paroxysmal nocturnal dyspnea[ ]   . Pulmonary: Cough [ ] ; Wheezing[ ] ; Hemoptysis[ ] ; Sputum [ ] ; Snoring [ ]   . GI: Vomiting[ ] ; Dysphagia[ ] ; Melena[ ] ; Hematochezia [ ] ; Heartburn[ ] ; Abdominal pain [ ] ; Constipation [ ] ; Diarrhea [ ] ; BRBPR [ ]   . GU: Hematuria[ ] ; Dysuria [ ] ; Nocturia[ ]    . Vascular: Pain in legs with walking [ ] ; Pain in feet with lying flat [ ] ; Non-healing sores [ ] ; Stroke [ ] ; TIA [ ] ; Slurred speech [ ] ;  . Neuro: Headaches[ ] ; Vertigo[ ] ; Seizures[ ] ; Paresthesias[ ] ;Blurred vision [ ] ; Diplopia [ ] ; Vision changes [ ]   . Ortho/Skin: Arthritis [ ] ; Joint pain [Y ]; Muscle pain [ ] ; Joint swelling [ ] ; Back Pain [ ] ; Rash [ ]   . Psych: Depression[ ] ; Anxiety[ ]   . Heme: Bleeding problems [ ] ; Clotting disorders [ ] ; Anemia [ ]   . Endocrine: Diabetes [ Y]; Thyroid dysfunction[ ]   Home Medications Prior to Admission medications   Medication Sig Start Date End Date Taking? Authorizing Provider  b complex-vitamin c-folic acid (NEPHRO-VITE) 0.8 MG TABS tablet Take 1 tablet by mouth daily. 05/18/19   [provider]  calcium acetate (PHOSLO) 667 MG tablet Take 1,334 mg by mouth 3 (three) times daily. 07/21/19   [provider]  COLCRYS 0.6 MG tablet Take 0.6 mg by mouth daily as needed (gout flare).  03/24/19   [provider]  hydrALAZINE (APRESOLINE) 25 MG tablet Take 37.5 mg by mouth 3 (three) times daily. 03/29/19   [provider]  isosorbide mononitrate (IMDUR) 30 MG 24 hr tablet Take 30 mg by mouth daily. 03/29/19   [provider]  [place] 100  UNIT/ML Pen Inject 25 Units into the skin daily. 07/30/19   [provider]  metoprolol succinate (TOPROL-XL) 50 MG 24 hr tablet Take 50 mg by mouth daily. 03/29/19   [provider]  NOVOLIN 70/30 FLEXPEN (70-30) 100 UNIT/ML PEN Inject 20 Units into the skin daily. 03/11/19   [provider]  NOVOLOG FLEXPEN 100 UNIT/ML FlexPen Inject 1 Units into the skin at bedtime. 07/30/19   [provider]  oxyCODONE-acetaminophen (PERCOCET) 7.5-325 MG tablet Take 1 tablet by mouth every 4 (four) hours as needed. 07/26/19   [provider]  oxyCODONE-acetaminophen (PERCOCET/ROXICET) 5-325 MG tablet Take 1 tablet by mouth 4 (four) times  daily as needed. 07/19/19   [provider]  pantoprazole (PROTONIX) 40 MG tablet Take 40 mg by mouth daily. 06/23/19   [provider]  RENVELA 800 MG tablet Take 2,400 mg by mouth 3 (three) times daily. 03/11/19   [provider]  rosuvastatin (CRESTOR) 5 MG tablet Take 5 mg by mouth daily. 03/11/19   [provider]  TRULICITY [id number] [id number] SOPN Inject 1 mg into the skin once a week. 07/30/19   [provider]  VELPHORO 500 MG chewable tablet Chew 500 mg by mouth 3 (three) times daily. 06/08/19   [provider]  VENTOLIN HFA 108 (90 Base) MCG/ACT inhaler Inhale 2 puffs into the lungs 4 (four) times daily as needed for wheezing or shortness of breath. 06/23/19   [provider]    Past Medical History: Past Medical History:  Diagnosis Date  . Anemia due to chronic kidney disease   . Cardiac arrest ([place]) 07/26/2019  . End-stage renal disease on hemodialysis ([place])   . Personal history of MRSA (methicillin resistant Staphylococcus aureus) 07/2020   pcr positive    Past Surgical History: Past Surgical History:  Procedure Laterality Date  . IR FLUORO GUIDE CV LINE LEFT  08/19/2019  . IR US GUIDE VASC ACCESS LEFT  08/19/2019    Family History: History reviewed. No pertinent family history.  Social History: Social History   Socioeconomic History  . Marital status: Married    Spouse name: Not on file  . Number of children: Not on file  . Years of education: Not on file  . Highest education level: Not on file  Occupational History  . Not on file  Tobacco Use  . Smoking status: Current Some Day Smoker    Types: Cigarettes  . Smokeless tobacco: Never Used  Substance and Sexual Activity  . Alcohol use: Yes    Comment: occasional  . Drug use: Yes    Types: Marijuana  . Sexual activity: Not on file  Other Topics Concern  . Not on file  Social History Narrative  . Not on file   Social Determinants of Health   Financial  Resource Strain:   . Difficulty of Paying Living Expenses: Not on file  Food Insecurity:   . Worried About Charity fundraiser in the Last Year: Not on file  . Ran Out of Food in the Last Year: Not on file  Transportation Needs:   . Lack of Transportation (Medical): Not on file  . Lack of Transportation (Non-Medical): Not on file  Physical Activity:   . Days of Exercise per Week: Not on file  . Minutes of Exercise per Session: Not on file  Stress:   . Feeling of Stress : Not on file  Social Connections:   . Frequency of Communication with Friends and Family:  Not on file  . Frequency of Social Gatherings with Friends and Family: Not on file  . Attends Religious Services: Not on file  . Active Member of Clubs or Organizations: Not on file  . Attends Archivist Meetings: Not on file  . Marital Status: Not on file    Allergies:  Allergies  Allergen Reactions  . Amlodipine Itching  . Dacarbazine And Related Nausea And Vomiting  . Fentanyl Itching  . Hydrocodone Nausea And Vomiting  . Propofol Itching    Objective:    Vital Signs:   Temp:  [97.7 F (36.5 C)-99.8 F (37.7 C)] 98.1 F (36.7 C) (02/12 1400) Pulse Rate:  [80-129] 122 (02/12 0118) Resp:  [8-23] 12 (02/12 1500) BP: (73-205)/(44-156) 130/67 (02/12 1500) SpO2:  [88 %-100 %] 96 % (02/12 1500) FiO2 (%):  [30 %] 30 % (02/12 1200) Weight:  [93.6 kg-98.1 kg] 98.1 kg (02/12 0600) Last BM Date: 08/27/19  Weight change: Filed Weights   08/26/19 2140 08/27/19 0118 08/27/19 0600  Weight: 94 kg 93.6 kg 98.1 kg    Intake/Output:   Intake/Output Summary (Last 24 hours) at 08/27/2019 1534 Last data filed at 08/27/2019 1500 Gross per 24 hour  Intake 658.52 ml  Output 482 ml  Net 176.52 ml      Physical Exam    General:  Well appearing. No resp difficulty HEENT: normal Neck: supple. JVP . Carotids 2+ bilat; no bruits. No lymphadenopathy or thyromegaly appreciated. Cor: PMI nondisplaced. Regular rate &  rhythm. No rubs, gallops or murmurs. Lungs: clear Abdomen: soft, nontender, nondistended. No hepatosplenomegaly. No bruits or masses. Good bowel sounds. Extremities: no cyanosis, clubbing, rash, LLE ischemic edema Neuro: alert & orientedx3, cranial nerves grossly intact. moves all 4 extremities w/o difficulty. Affect pleasant   Telemetry   SR 80s   EKG    n/a  Labs   Basic Metabolic Panel: Recent Labs  Lab 08/21/19 0443 08/21/19 0443 08/21/19 1125 08/22/19 0427 08/22/19 0946 08/22/19 2300 08/23/19 0202 08/23/19 0202 08/23/19 1349 08/24/19 0232 08/24/19 0240 08/24/19 1121 08/24/19 2314 08/25/19 1101 08/25/19 2321 08/26/19 1143 08/27/19 0557  NA 140   < >  --   --  140  --  136  --   --   --  139  --   --   --   --  138 139  K 4.2   < >  --   --  4.6  --  4.5  --   --   --  4.6  --   --   --   --  5.1 4.3  CL 102   < >  --   --  99  --  93*  --   --   --  95*  --   --   --   --  96* 96*  CO2 19*   < >  --   --  18*  --  20*  --   --   --  18*  --   --   --   --  19* 20*  GLUCOSE 123*   < >   < >  --  153*   < > 139*   < >   < >  --  154*   < > 129* 142* 98 129* 149*  BUN 42*   < >  --   --  64*  --  75*  --   --   --  78*  --   --   --   --  108* 60*  CREATININE 3.62*   < >  --   --  5.19*  --  5.92*  --   --   --  5.96*  --   --   --   --  7.76* 5.03*  CALCIUM 7.4*   < >  --   --  7.7*  --  7.6*   < >  --   --  8.0*  --   --   --   --  8.0* 7.9*  MG 2.2  --   --  2.4  --   --   --   --   --  2.3  --   --   --   --   --   --   --   PHOS  --   --   --   --  4.7*  --  4.8*  --   --   --   --   --   --   --   --   --   --    < > = values in this interval not displayed.    Liver Function Tests: Recent Labs  Lab 08/22/19 0946 08/23/19 0202 08/24/19 0240 08/26/19 1143  AST  --   --  15 15  ALT  --   --  20 18  ALKPHOS  --   --  127* 127*  BILITOT  --   --  0.6 0.7  PROT  --   --  5.9* 6.4*  ALBUMIN 2.1* 2.0* 1.9* 2.0*   No results for input(s): LIPASE, AMYLASE  in the last 168 hours. No results for input(s): AMMONIA in the last 168 hours.  CBC: Recent Labs  Lab 08/23/19 0202 08/24/19 0240 08/25/19 0541 08/26/19 0344 08/27/19 0557  WBC 14.8* 16.0* 15.7* 14.7* 21.7*  HGB 7.5* 7.3* 7.4* 6.0* 8.4*  HCT 25.6* 23.8* 24.6* 20.7* 26.3*  MCV 92.4 89.8 89.5 92.4 87.7  PLT 261 292 368 307 353    Cardiac Enzymes: No results for input(s): CKTOTAL, CKMB, CKMBINDEX, TROPONINI in the last 168 hours.  BNP: BNP (last 3 results) No results for input(s): BNP in the last 8760 hours.  ProBNP (last 3 results) No results for input(s): PROBNP in the last 8760 hours.   CBG: Recent Labs  Lab 08/26/19 2042 08/27/19 0154 08/27/19 0420 08/27/19 0652 08/27/19 1223  GLUCAP 114* 76 120* 130* 121*    Coagulation Studies: No results for input(s): LABPROT, INR in the last 72 hours.   Imaging   VAS [place] ABI WITH/WO TBI  Result Date: 08/27/2019 LOWER EXTREMITY DOPPLER STUDY Indications: Ulceration, and gangrene. High Risk Factors: Hypertension, hyperlipidemia, Diabetes. Other Factors: ESRD.  Limitations: Today's exam was limited due to an open wound and bandages. Comparison Study: No prior study Performing Technologist: Maudry Mayhew MHA, RVT, RDCS, RDMS  Examination Guidelines: A complete evaluation includes at minimum, Doppler waveform signals and systolic blood pressure reading at the level of bilateral brachial, anterior tibial, and posterior tibial arteries, when vessel segments are accessible. Bilateral testing is considered an integral part of a complete examination. Photoelectric Plethysmograph (PPG) waveforms and toe systolic pressure readings are included as required and additional duplex testing as needed. Limited examinations for reoccurring indications may be performed as noted.  ABI Findings: +---------+------------------+-----+----------+--------------------------------+ Right    Rt Pressure (mmHg)IndexWaveform  Comment                           +---------+------------------+-----+----------+--------------------------------+  Brachial 115                    triphasic                                  +---------+------------------+-----+----------+--------------------------------+ PTA      107               0.93 monophasic                                 +---------+------------------+-----+----------+--------------------------------+ DP       71                0.62 monophasic                                 +---------+------------------+-----+----------+--------------------------------+ Great Toe                                 Unable to evaluate due to wounds +---------+------------------+-----+----------+--------------------------------+ +---------+------------------+-----+--------+----------------------------+ Left     Lt Pressure (mmHg)IndexWaveformComment                      +---------+------------------+-----+--------+----------------------------+ Brachial                                Unable to evaluate due to IV +---------+------------------+-----+--------+----------------------------+ PTA                                     BKA                          +---------+------------------+-----+--------+----------------------------+ DP                                      BKA                          +---------+------------------+-----+--------+----------------------------+ Great Toe                               BKA                          +---------+------------------+-----+--------+----------------------------+ +-------+-----------+-----------+------------+------------+ ABI/TBIToday's ABIToday's TBIPrevious ABIPrevious TBI +-------+-----------+-----------+------------+------------+ Right  0.93                                           +-------+-----------+-----------+------------+------------+  Summary: Right: Resting right ankle-brachial index indicates mild right lower extremity  arterial disease, however diminished waveforms indicate this ABI may be falsely elevated. Left: BKA  *See table(s) above for measurements and observations.     Preliminary    VAS [place] LOWER EXTREMITY ARTERIAL DUPLEX  Result Date: 08/27/2019 LOWER EXTREMITY ARTERIAL DUPLEX STUDY Indications: Gangrene, peripheral artery disease, and left BKA and Abnormal ABI. High Risk Factors: Hypertension, hyperlipidemia, Diabetes. Other Factors: ESRD.  Current ABI: Right= 0.93 (possibly falsely elevated). Left- BKA Limitations: Bandaging Comparison Study: No prior study Performing Technologist: Maudry Mayhew MHA, RDMS, RVT, RDCS  Examination Guidelines: A complete evaluation includes B-mode imaging, spectral Doppler, color Doppler, and power Doppler as needed of all accessible portions of each vessel. Bilateral testing is considered an integral part of a complete examination. Limited examinations for reoccurring indications may be performed as noted.  +-----------+--------+-----+--------+----------+-------------------+ RIGHT      PSV cm/sRatioStenosisWaveform  Comments            +-----------+--------+-----+--------+----------+-------------------+ EIA Distal 115                  triphasic                     +-----------+--------+-----+--------+----------+-------------------+ CFA Distal 151                  triphasic                     +-----------+--------+-----+--------+----------+-------------------+ DFA        148                  triphasic                     +-----------+--------+-----+--------+----------+-------------------+ SFA Prox   172                  monophasicheterogenous plaque +-----------+--------+-----+--------+----------+-------------------+ SFA Mid    85                   monophasic                    +-----------+--------+-----+--------+----------+-------------------+ SFA Distal 83                   monophasic                     +-----------+--------+-----+--------+----------+-------------------+ POP Prox   93                   monophasic                    +-----------+--------+-----+--------+----------+-------------------+ POP Distal 60                   monophasic                    +-----------+--------+-----+--------+----------+-------------------+ TP Trunk   53                   monophasic                    +-----------+--------+-----+--------+----------+-------------------+ ATA Distal 68                   monophasic                    +-----------+--------+-----+--------+----------+-------------------+ PTA Distal 23                   monophasic                    +-----------+--------+-----+--------+----------+-------------------+ PERO Distal             occludedabsent                        +-----------+--------+-----+--------+----------+-------------------+ DP  31                   monophasic                    +-----------+--------+-----+--------+----------+-------------------+  Summary: Right: No hemodynamically significant stenosis obsverved in the visualized arteries of the right lower extremity, however waveforms are dampened distally. Two vessel runoff (DP and PT) is visualized.  See table(s) above for measurements and observations.    Preliminary       Medications:     Current Medications: . Chlorhexidine Gluconate Cloth  6 each Topical Q0600  . Chlorhexidine Gluconate Cloth  6 each Topical Q0600  . cinacalcet  30 mg Oral Q breakfast  . darbepoetin (ARANESP) injection - DIALYSIS  150 mcg Intravenous Q Sat-HD  . DULoxetine  20 mg Oral Daily  . feeding supplement (NEPRO CARB STEADY)  237 mL Oral TID BM  . feeding supplement (PRO-STAT SUGAR FREE 64)  30 mL Oral BID  . gabapentin  200 mg Oral QHS  . insulin aspart  0-15 Units Subcutaneous TID WC  . insulin detemir  8 Units Subcutaneous BID  . midodrine  10 mg Oral TID WC  . multivitamin  1 tablet Oral  QHS  . rosuvastatin  5 mg Oral Daily  . sodium chloride flush  10-40 mL Intracatheter Q12H     Infusions: . sodium chloride    . ceFEPime (MAXIPIME) IV Stopped (08/26/19 1744)  . norepinephrine (LEVOPHED) Adult infusion 12 mcg/min (08/27/19 1500)  . [START ON 09/19/19] sodium thiosulfate infusion for calciphylaxis    . [START ON 08/30/2019] sodium thiosulfate infusion for calciphylaxis    . [START ON 09-19-19] vancomycin    . [START ON 08/30/2019] vancomycin          Assessment/Plan   1. Cardiac Arrest -Unwitnessed Initially intubated but extubated on 2/3.  2. Septic Shock  -MRSA Pneumonia in respiratory cultures  - Blood Cx negative.  - On antibiotics.   3. Acute Systolic Heart Failure  -ECHO EF 30-35%. No previous cardiac history.  - No bb with shock - Volume managed per HD.   4. Ischemic Left Leg  Ortho consulted  Not a candidate for surgery pressor requirement.   5. Anemia  hgb 6> transfused PRBC> Hgb up to 8.4   6. Calciphylaxis  7. DMII -On sliding scale   8. DNR   Length of Stay: [place], NP  08/27/2019, 3:34 PM  Advanced Heart Failure Team Pager [phone] (M-F; 7a - 4p)  Please contact [facility] for night-coverage after hours (4p -7a ) and weekends on [email or web address]  Agree with above.   Very difficult situation. 54 y/o male with ESRD on HD since 2015, DM2 and PAD s/p L BKA. Admitted with PEA arrest due to probable MRSA PNA.   Subsequently extubated. Echo shows EF 30-35% (newly reduced) with global HK. Patient now NE dependent. 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 L leg is s/p previous BKA and now is severely ischemic, foul-smelling and unsalvageable. Seen by Ortho and would need hip disarticulation but patient too unstable and not enough viable tissue to heal. (I d/w Dr. Sharol Given personally).   On exam Lying in bed weak appearing NAD JVP 8-9 Cor RRR Lungs clear Ab soft NT Ext mild edema. R foot dry gangrene. LLE s/p BKA wrapped in gauze which  with purulent bloody drainage. 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 That said co-ox 73% suggests that hypotension more likely due to infection/distributory shock than cardiogenic.  Currently not candidate for cath. Agree with starting midodrine to facilitate NE wean.   I had a frank talk with him today that he may never be eligible for left hip disarticulation and hypotension may only progress and in that setting Hospice would be the only option.   We will see if titrating midodrine helps his BP. Little else to offer now. We will follow and make changes as needed.   CRITICAL CARE Performed by: Glori Bickers  Total critical care time: 45 minutes  Critical care time was exclusive of separately billable procedures and treating other patients.  Critical care was necessary to treat or prevent imminent or life-threatening deterioration.  Critical care was time spent personally by me (independent of midlevel providers or residents) on the following activities: development of treatment plan with patient and/or surrogate as well as nursing, discussions with consultants, evaluation of patient's response to treatment, examination of patient, obtaining history from patient or surrogate, ordering and performing treatments and interventions, ordering and review of laboratory studies, ordering and review of radiographic studies, pulse oximetry and re-evaluation of patient's condition.  Glori Bickers, MD  9:58 PM

## 2019-08-27 NOTE — Progress Notes (Signed)
NAME:  [name], MRN:  [id number], DOB:  [birth year], LOS: 25 ADMISSION DATE:  08/01/2019, CONSULTATION DATE: 08/01/2019 REFERRING MD: Dr. Roxanne Mins, CHIEF COMPLAINT: Cardiac arrest  Brief History   54 yo male had witnessed cardiac arrest with ROSC after 3 minutes.  Intubated for airway protection.  Found to have MRSA PNA, gangrenous Rt foot, and cold gangrene Lt BKA stump.  Past Medical History  ESRD, HLD, HTN, DM  Significant Hospital Events   1/28 Admitted. On levophed and added vasopressin  1/29 O/N Hyperkalemic, CRRT for correction also GOC discussion with family, made DNR. Still on levophed and multiple analgesics/sedatives. 2/03  Extubated, off pressors 2/04  Attempted bedside central line placement, IR consulted and placed L External Jugular line. 2/07 ACTH stimulation test performed, results normal 2/09  Lower Extremity dopplers reveal DVT in L common femoral vein and L ("superficial") femoral vein 2/11 transfuse 1 unit PRBC  Consults:  Nephrology Orthopedics Plastics Palliative care  Procedures:  CVL by IR 2/04 >>   Significant Diagnostic Tests:  Echo 1/29 >> EF 30 to 35%, grade 1 DD CT Lt femur 2/07 >> renal osteodystrophy, no abscess or osteomyelitis  Micro Data:  SARS CoV2 PCR 1/28 >> negative Influenza PCR 1/28 >> negative Sputum 1/28 >> MRSA Blood 1/28 >> negative  Antimicrobials:  Zosyn 1/28 >> 1/30 Vancomycin 1/28 >> 2/04 Cefepime 2/07 >> Vancomycin 2/07 >>   Interim history/subjective:  Increasing pressors needs after iHD.   Objective   Blood pressure (!) 89/54, pulse (!) 122, temperature 99.4 F (37.4 C), temperature source Axillary, resp. rate 13, height 5\' 9"  (1.753 m), weight 98.1 kg, SpO2 96 %.        Intake/Output Summary (Last 24 hours) at 08/27/2019 0900 Last data filed at 08/27/2019 0700 Gross per 24 hour  Intake 893.73 ml  Output 482 ml  Net 411.73 ml   Filed Weights   08/26/19 2140 08/27/19 0118 08/27/19 0600  Weight: 94 kg  93.6 kg 98.1 kg    Examination:  General - alert Eyes - pupils reactive ENT - no sinus tenderness, no stridor Cardiac - regular rate/rhythm, no murmur Chest - equal breath sounds b/l, no wheezing or rales Abdomen - soft, non tender, + bowel sounds Extremities - gangrenous changes Rt foot and Lt leg stump Skin - no rashes Neuro - normal strength, moves extremities, follows commands Psych - normal mood and behavior  Resolved Hospital Problem list   Cardiac arrest Prolonged QTC Acute hypoxic respiratory failure MRSA pneumonia Acute metabolic encephalopathy from hypoxia  Assessment & Plan:   Septic shock from gangrene Rt foot and Lt leg stump. - day 6 of ABx - f/u with orthopedics and plastic surgery >> pt now agreeable to surgical intervention - pressors to keep MAP > 60 - continue midodrine  Respiratory arrest from MRSA PNA leading to cardiac arrest with acute systolic CHF. Hx of HLD. - restart crestor - will ask CHF team to assess  Lt leg DVT. - heparin per pharmacy  ESRD. - HD per renal  Chronic calciphylaxis with chronic pain. - continue neurontin, cymbalta, dilaudid - sodium thiosulfate 25 mg IV with HD  Anemia of critical illness and chronic disease. - f/u CBC - transfuse for Hb < 7 or significant bleeding - continue weekly aranesp  DM type II poorly controlled. - SSI with levemir  Best practice:  Diet: Renal/carb modified with fluid restriction DVT prophylaxis: Heparin GI prophylaxis: not indicated Mobility: OOB Code Status: DNR Disposition: ICU  Labs:  CMP Latest Ref Rng & Units 08/27/2019 08/26/2019 08/25/2019  Glucose 70 - 99 mg/dL 149(H) 129(H) 98  BUN 6 - 20 mg/dL 60(H) 108(H) -  Creatinine 0.61 - 1.24 mg/dL 5.03(H) 7.76(H) -  Sodium 135 - 145 mmol/L 139 138 -  Potassium 3.5 - 5.1 mmol/L 4.3 5.1 -  Chloride 98 - 111 mmol/L 96(L) 96(L) -  CO2 22 - 32 mmol/L 20(L) 19(L) -  Calcium 8.9 - 10.3 mg/dL 7.9(L) 8.0(L) -  Total Protein 6.5 - 8.1  g/dL - 6.4(L) -  Total Bilirubin 0.3 - 1.2 mg/dL - 0.7 -  Alkaline Phos 38 - 126 U/L - 127(H) -  AST 15 - 41 U/L - 15 -  ALT 0 - 44 U/L - 18 -    CBC Latest Ref Rng & Units 08/27/2019 08/26/2019 08/25/2019  WBC 4.0 - 10.5 K/uL 21.7(H) 14.7(H) 15.7(H)  Hemoglobin 13.0 - 17.0 g/dL 8.4(L) 6.0(LL) 7.4(L)  Hematocrit 39.0 - 52.0 % 26.3(L) 20.7(L) 24.6(L)  Platelets 150 - 400 K/uL 353 307 368    ABG    Component Value Date/Time   PHART 7.422 08/16/2019 1520   PCO2ART 33.7 08/16/2019 1520   PO2ART 177.0 (H) 08/16/2019 1520   HCO3 21.9 08/16/2019 1520   TCO2 23 08/16/2019 1520   ACIDBASEDEF 2.0 08/16/2019 1520   O2SAT 100.0 08/16/2019 1520    CBG (last 3)  Recent Labs    08/27/19 0154 08/27/19 0420 08/27/19 0652  GLUCAP 76 120* 130*    CC time 32 minutes  Chesley Mires, MD [place] 08/27/2019, 9:18 AM

## 2019-08-27 NOTE — Progress Notes (Addendum)
Interim Progress Note:  Spoke with Silvestre Gunner from Ortho. Keep pt NPO for now as he might be able to have surgery today 2/12. Patient informed. On board with plan. Was also warned it may not get to happen today. Knows not to eat until we tell him otherwise. Pt's RN Leroy Sea also informed.  Ordering ABIs, if not particularly informative will also perform LE Duplex US.   Hold Heparin for now.    Milus Banister, [place], PGY-2 08/27/2019 9:54 AM

## 2019-08-28 DIAGNOSIS — R52 Pain, unspecified: Secondary | ICD-10-CM

## 2019-08-28 DIAGNOSIS — I5021 Acute systolic (congestive) heart failure: Secondary | ICD-10-CM

## 2019-08-28 DIAGNOSIS — F411 Generalized anxiety disorder: Secondary | ICD-10-CM

## 2019-08-28 LAB — CBC
HCT: 29 % — ABNORMAL LOW (ref 39.0–52.0)
Hemoglobin: 8.6 g/dL — ABNORMAL LOW (ref 13.0–17.0)
MCH: 27.6 pg (ref 26.0–34.0)
MCHC: 29.7 g/dL — ABNORMAL LOW (ref 30.0–36.0)
MCV: 92.9 fL (ref 80.0–100.0)
Platelets: 307 10*3/uL (ref 150–400)
RBC: 3.12 MIL/uL — ABNORMAL LOW (ref 4.22–5.81)
RDW: 19.7 % — ABNORMAL HIGH (ref 11.5–15.5)
WBC: 14.1 10*3/uL — ABNORMAL HIGH (ref 4.0–10.5)
nRBC: 0 % (ref 0.0–0.2)

## 2019-08-28 LAB — BASIC METABOLIC PANEL
Anion gap: 26 — ABNORMAL HIGH (ref 5–15)
BUN: 74 mg/dL — ABNORMAL HIGH (ref 6–20)
CO2: 16 mmol/L — ABNORMAL LOW (ref 22–32)
Calcium: 8.1 mg/dL — ABNORMAL LOW (ref 8.9–10.3)
Chloride: 98 mmol/L (ref 98–111)
Creatinine, Ser: 5.91 mg/dL — ABNORMAL HIGH (ref 0.61–1.24)
GFR calc Af Amer: 11 mL/min — ABNORMAL LOW (ref >60)
GFR calc non Af Amer: 10 mL/min — ABNORMAL LOW (ref >60)
Glucose, Bld: 98 mg/dL (ref 70–99)
Potassium: 4.9 mmol/L (ref 3.5–5.1)
Sodium: 140 mmol/L (ref 135–145)

## 2019-08-28 LAB — GLUCOSE, CAPILLARY
Glucose-Capillary: 102 mg/dL — ABNORMAL HIGH (ref 70–99)
Glucose-Capillary: 178 mg/dL — ABNORMAL HIGH (ref 70–99)
Glucose-Capillary: 76 mg/dL (ref 70–99)

## 2019-08-28 MED ORDER — GLYCOPYRROLATE 0.2 MG/ML IJ SOLN
0.2000 mg | INTRAMUSCULAR | Status: DC | PRN
  Administered 2019-08-28: 0.2 mg via INTRAVENOUS
  Filled 2019-08-28: qty 1

## 2019-08-28 MED ORDER — HYDROMORPHONE HCL 1 MG/ML IJ SOLN
3.0000 mg | INTRAMUSCULAR | Status: AC
  Administered 2019-08-28: 3 mg via INTRAVENOUS

## 2019-08-28 MED ORDER — HYDROMORPHONE HCL 1 MG/ML IJ SOLN
0.5000 mg | INTRAMUSCULAR | Status: DC | PRN
  Administered 2019-08-28: 1 mg via INTRAVENOUS
  Filled 2019-08-28 (×4): qty 1

## 2019-08-28 MED ORDER — SODIUM CHLORIDE 0.9 % IV SOLN
5.0000 mg/h | INTRAVENOUS | Status: DC
  Administered 2019-08-28: 5 mg/h via INTRAVENOUS
  Filled 2019-08-28: qty 2.5

## 2019-08-28 MED ORDER — HYDROMORPHONE BOLUS VIA INFUSION
0.5000 mg | INTRAVENOUS | Status: DC | PRN
  Filled 2019-08-28: qty 1

## 2019-08-28 MED ORDER — MIDAZOLAM HCL 2 MG/2ML IJ SOLN: 2.0000 mg | INTRAMUSCULAR | Status: DC | PRN

## 2019-08-28 MED ORDER — HYDROMORPHONE HCL 1 MG/ML IJ SOLN: 0.5000 mg | INTRAMUSCULAR | Status: DC | PRN

## 2019-09-13 NOTE — Progress Notes (Signed)
Patient and family had requested prayer after choosing to transition to comfort care. Prayed with family at bedside until end of life. Will remain available for family if necessary.   Rev. [place].

## 2019-09-13 NOTE — Death Summary Note (Signed)
DEATH SUMMARY   Patient Details  Name: [name] MRN: [id number] DOB: 10-Jun-1966  Admission/Discharge Information   Admit Date:  09-05-2019  Date of Death: Date of Death: 2019/09/21  Time of Death: Time of Death: 10/29/1421  Length of Stay: 22-Oct-2022  Referring Physician: [facility] [place]   Reason(s) for Hospitalization  Cardiac arrest  Diagnoses  Preliminary cause of death:   Septic shock Secondary Diagnoses (including complications and co-morbidities):  Active Problems:   Cardiac arrest (HCC)   End-stage renal disease on hemodialysis (HCC)   Hyperkalemia   Anemia due to chronic kidney disease, on chronic dialysis (HCC)   Encephalopathy acute   Calciphylaxis   Acute respiratory failure (HCC)   Gangrene of toe of right foot (HCC)   Atherosclerosis of native arteries of extremities with gangrene, left leg (HCC)   Palliative care by specialist   Terminal care   Acute systolic heart failure (HCC)   Generalized pain   Anxiety state gangrene left leg  Brief Hospital Course (including significant findings, care, treatment, and services provided and events leading to death)  54 y/o male had a witnessed cardiac arrest and ROS after three minutes, intubated for airway protetion, noted to have MRSA pneumonia, gangrenous R foot. Was extubated on 2/3, has been found to have advanced heart failure.  Gangrenous changes in R leg not amenable to surgery as would require disarcticulation and he is too unstable for general anesthesia.  Palliative medicine saw him on 2/12, DNR code status.  Past Medical History  ESRD, HLD, HTN, DM  Significant Hospital Events   September 04, 2022 Admitted. On levophed and added vasopressin  1/29 O/N Hyperkalemic, CRRT for correction also GOC discussion with family, made DNR. Still on levophed and multiple analgesics/sedatives. 2/03 Extubated, off pressors 2/04 Attempted bedside central line placement, IR consulted and placed L External Jugular line. 2/07 ACTH  stimulation test performed, results normal 2/09 Lower Extremity dopplers reveal DVT in L common femoral vein and L ("superficial") femoral vein 2/11 transfuse 1 unit PRBC 2/12 palliative medicine, advanced heart failure consults, orthopedics consultation said that he was not medically stable enough for surgery and it would be very involved (disarticulation) even if he could survive it.  On 09/20/22 he indicated that he did wanted to focus solely on his comfort and pass peacefully.  Palliative medicine and cardiology assisted with this.   Pertinent Labs and Studies  Significant Diagnostic Studies DG Chest 1 View  Result Date: 08/19/2019 CLINICAL DATA:  Central line placement EXAM: CHEST  1 VIEW COMPARISON:  08/17/2019 FINDINGS: Frontal view of the chest demonstrates stable cardiac silhouette. Vascular stent right subclavian distribution unchanged. Central venous catheter from an inferior approach tip overlies the junction of the inferior vena cava and right atrium. I do not see any new central venous catheter is on this exam. Stable bibasilar scarring. No airspace disease, effusion, or pneumothorax. IMPRESSION: 1. Stable central venous catheter from an inferior approach. 2. No other central lines are identified on this exam. Electronically Signed   By: Randa Ngo M.D.   On: 08/19/2019 14:14   CT Head Wo Contrast  Result Date: 2019/09/05 CLINICAL DATA:  Cerebral hemorrhage suspected, unresponsive, found by friend post witnessed arrest EXAM: CT HEAD WITHOUT CONTRAST TECHNIQUE: Contiguous axial images were obtained from the base of the skull through the vertex without intravenous contrast. COMPARISON:  None. FINDINGS: Brain: No evidence of acute infarction, hemorrhage, hydrocephalus, extra-axial collection or mass lesion/mass effect. Symmetric prominence of the ventricles, cisterns and sulci compatible  with parenchymal volume loss. Patchy areas of white matter hypoattenuation are most compatible with  chronic microvascular angiopathy. Extensive dural calcifications are noted along the falx and tentorium. Vascular: Extensive calcification of the carotid siphons and vertebral arteries. No unexpected hyperdense vessel is evident. Skull: No calvarial fracture or suspicious osseous lesion. No scalp swelling or hematoma. Diffuse scalp edema is seen. Benign scattered scalp calcifications. Sinuses/Orbits: Patient is intubated at the time of exam with tract transesophageal and endotracheal tubes extending below the level of imaging. Secretions in the ethmoids and posterior nasopharynx are likely related to this intubation. Minimal mucosal thickening in the maxillary sinuses. Prior lens extractions are noted. Mildly proptotic configuration of the globes which is symmetric bilaterally. No abnormal thickening of the extraocular musculature or abnormality of the optic nerve should complexes or superior ophthalmic veins. Other: None IMPRESSION: 1. No CT evidence of acute intracranial process. 2. Chronic microvascular ischemic disease and parenchymal volume loss. 3. Diffuse soft tissue edema.  Correlate with hydration status. 4. Extensive intracranial atherosclerosis. 5. Secretions in the ethmoids and posterior nasopharynx are likely to intubation. 6. Mildly proptotic configuration of the globes which is symmetric bilaterally. Electronically Signed   By: Lovena Le M.D.   On: 07/25/2019 02:43   CT FEMUR LEFT W CONTRAST  Result Date: 08/22/2019 CLINICAL DATA:  Calciphylaxis and foul-smelling leg wound. EXAM: CT OF THE LOWER RIGHT EXTREMITY WITH CONTRAST TECHNIQUE: Multidetector CT imaging of the lower right extremity was performed according to the standard protocol following intravenous contrast administration. COMPARISON:  None. CONTRAST:  181mL OMNIPAQUE IOHEXOL 300 MG/ML  SOLN FINDINGS: Bones/Joint/Cartilage Osteopenia and coarsening of trabecula with subchondral sacroiliac erosion, attributed to renal osteodystrophy. No  evidence of osteomyelitis. 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 Ligaments Suboptimally assessed by CT. Muscles and Tendons Broad skin defect along the anterolateral left thigh without undermining or collection. A knee joint effusion is present without any articular erosions or significant size to implicate septic effusion. Soft tissues Bilateral subcutaneous reticulation, likely from volume overload. There could be an element of cellulitis which would be better detected on exam. No evidence of fasciitis. No soft tissue emphysema. Heavily calcified vessels especially of the superficial femoral artery which is not visibly enhancing. IMPRESSION: 1. Negative for abscess or osteomyelitis. 2. Renal osteodystrophy. Electronically Signed   By: Monte Fantasia M.D.   On: 08/22/2019 14:22   IR Fluoro Guide CV Line Left  Result Date: 08/19/2019 INDICATION: 54 year old with poor venous access, end-stage renal disease and hemodialysis. Recent cardiac arrest. EXAM: FLUOROSCOPIC AND ULTRASOUND GUIDED PLACEMENT OF A NON TUNNELED VENOUS CATHETER Physician: Stephan Minister. Henn, MD MEDICATIONS: None ANESTHESIA/SEDATION: None FLUOROSCOPY TIME:  Fluoroscopy Time: 1 minutes and 18 seconds COMPLICATIONS: None immediate. PROCEDURE: Informed consent was obtained for catheter placement. The patient was placed supine on the interventional table. Ultrasound confirmed a patent left external jugular vein. Ultrasound images were obtained for documentation. The left neck was prepped and draped in a sterile fashion. The left neck was anesthetized with 1% lidocaine. Maximal barrier sterile technique was utilized including caps, mask, sterile gowns, sterile gloves, sterile drape, hand hygiene and skin antiseptic. A 21 gauge needle directed into the left external jugular vein with ultrasound guidance. A micropuncture dilator set was placed. Central venogram was performed. Dual lumen Power PICC line was selected. Catheter was  cut to 8 cm. A peel-away sheath was placed over a 0.018 wire. The Power PICC was advanced through the peel-away sheath and positioned in the left innominate  vein. Both lumens aspirated and flushed well. Both lumens were flushed with saline, capped and clamped. Catheter was sutured to skin. Dressing was placed over the catheter. Fluoroscopic and ultrasound images were taken and saved for documentation. FINDINGS: Patent left external jugular vein. Central venogram demonstrates obstruction of the central left innominate vein with extensive collateral flow into the SVC. Tip of the venous catheter was placed in what appears to be the left innominate vein. IMPRESSION: Placement of a left external jugular venous catheter. Catheter tip in the region of the left innominate vein. 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 Electronically Signed   By: Markus Daft M.D.   On: 08/19/2019 18:20   IR US Guide Vasc Access Left  Result Date: 08/19/2019 INDICATION: 54 year old with poor venous access, end-stage renal disease and hemodialysis. Recent cardiac arrest. EXAM: FLUOROSCOPIC AND ULTRASOUND GUIDED PLACEMENT OF A NON TUNNELED VENOUS CATHETER Physician: Stephan Minister. Henn, MD MEDICATIONS: None ANESTHESIA/SEDATION: None FLUOROSCOPY TIME:  Fluoroscopy Time: 1 minutes and 18 seconds COMPLICATIONS: None immediate. PROCEDURE: Informed consent was obtained for catheter placement. The patient was placed supine on the interventional table. Ultrasound confirmed a patent left external jugular vein. Ultrasound images were obtained for documentation. The left neck was prepped and draped in a sterile fashion. The left neck was anesthetized with 1% lidocaine. Maximal barrier sterile technique was utilized including caps, mask, sterile gowns, sterile gloves, sterile drape, hand hygiene and skin antiseptic. A 21 gauge needle directed into the left external jugular vein with ultrasound guidance. A micropuncture dilator set was placed.  Central venogram was performed. Dual lumen Power PICC line was selected. Catheter was cut to 8 cm. A peel-away sheath was placed over a 0.018 wire. The Power PICC was advanced through the peel-away sheath and positioned in the left innominate vein. Both lumens aspirated and flushed well. Both lumens were flushed with saline, capped and clamped. Catheter was sutured to skin. Dressing was placed over the catheter. Fluoroscopic and ultrasound images were taken and saved for documentation. FINDINGS: Patent left external jugular vein. Central venogram demonstrates obstruction of the central left innominate vein with extensive collateral flow into the SVC. Tip of the venous catheter was placed in what appears to be the left innominate vein. IMPRESSION: Placement of a left external jugular venous catheter. Catheter tip in the region of the left innominate vein. 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 Electronically Signed   By: Markus Daft M.D.   On: 08/19/2019 18:20   DG Chest Port 1 View  Result Date: 08/17/2019 CLINICAL DATA:  Recent cardiac arrest, check endotracheal tube placement EXAM: PORTABLE CHEST 1 VIEW COMPARISON:  08/15/2018 FINDINGS: Endotracheal tube and gastric catheter are noted in satisfactory position. Femoral dialysis catheter is noted in satisfactory position. Mild atelectatic changes are noted in the left base. No effusion is seen. The right subclavian venous stent and brachial venous stent are noted. IMPRESSION: Mild left basilar atelectasis. Tubes and lines as described Electronically Signed   By: Inez Catalina M.D.   On: 08/17/2019 02:21   DG Chest Port 1 View  Result Date: 08/16/2019 CLINICAL DATA:  Respiratory failure. EXAM: PORTABLE CHEST 1 VIEW COMPARISON:  02/20/2018 FINDINGS: The endotracheal tube is 5.5 cm above the carina. The NG tube is coursing down the esophagus and into the stomach. A central venous catheter is noted in the right atrium coming up from the IVC. Vascular  stents are noted. Aortic calcifications are stable. The heart is normal in size. Streaky left  basilar atelectasis but overall improved lung aeration since prior study. IMPRESSION: 1. Stable support apparatus. 2. Improved lung aeration with residual streaky left basilar atelectasis. Electronically Signed   By: Marijo Sanes M.D.   On: 08/16/2019 07:07   DG CHEST PORT 1 VIEW  Result Date: 08/04/2019 CLINICAL DATA:  Endotracheal tube placement. EXAM: PORTABLE CHEST 1 VIEW COMPARISON:  Earlier same day FINDINGS: Endotracheal tube tip 5.5 cm from the carina. Orogastric tube enters the abdomen. Femoral venous catheter tip in the right atrium. Slight worsening of density in the mid and lower lungs left more than right could be edema, pneumonia or atelectasis. Upper lungs remain clear. IMPRESSION: Endotracheal tube tip 5.5 cm above the carina. Worsened bilateral lower lung density left more than right could be edema, pneumonia or atelectasis. Electronically Signed   By: Nelson Chimes M.D.   On: 07/20/2019 14:32   DG Chest Port 1 View  Result Date: 07/28/2019 CLINICAL DATA:  Post intubation EXAM: PORTABLE CHEST 1 VIEW COMPARISON:  Radiograph February 20, 2018 FINDINGS: Endotracheal tube terminates in the mid trachea, 5 cm from the carina. Transesophageal tube tip and side port distal to the GE junction, terminating below the level of imaging. Telemetry leads and pacer pads overlie the chest. Some bandlike opacity in the left mid lung likely reflect subsegmental atelectasis. No consolidation, features of edema, pneumothorax, or effusion. The cardiomediastinal contours are unremarkable. No acute osseous or soft tissue abnormality. IMPRESSION: Lines and tubes as above. Subsegmental atelectasis in the left mid lung. No other acute cardiopulmonary abnormality. Electronically Signed   By: Lovena Le M.D.   On: 07/28/2019 01:59   DG Abd Portable 1V  Result Date: 08/18/2019 CLINICAL DATA:  OG tube placement EXAM: PORTABLE  ABDOMEN - 1 VIEW COMPARISON:  08/16/2019 FINDINGS: The tip of the OG tube projects over the gastric body. There is a femoral approach dialysis catheter that appears well position. The stomach is distended. The bowel gas pattern is otherwise nonspecific. IMPRESSION: Enteric tube terminates over the stomach. Electronically Signed   By: Constance Holster M.D.   On: 08/18/2019 00:21   DG Abd Portable 1V  Result Date: 08/16/2019 CLINICAL DATA:  Enteric tube placement EXAM: PORTABLE ABDOMEN - 1 VIEW COMPARISON:  None. FINDINGS: Enteric tube terminates within the body of the stomach. Femoral approach central line tip overlies the right atrium. Visualized bowel gas pattern is unremarkable. IMPRESSION: Enteric tube terminates within the stomach. Electronically Signed   By: Macy Mis M.D.   On: 08/16/2019 16:34   VAS [place] ABI WITH/WO TBI  Result Date: 08/27/2019 LOWER EXTREMITY DOPPLER STUDY Indications: Ulceration, and gangrene. High Risk Factors: Hypertension, hyperlipidemia, Diabetes. Other Factors: ESRD.  Limitations: Today's exam was limited due to an open wound and bandages. Comparison Study: No prior study Performing Technologist: Maudry Mayhew MHA, RVT, RDCS, RDMS  Examination Guidelines: A complete evaluation includes at minimum, Doppler waveform signals and systolic blood pressure reading at the level of bilateral brachial, anterior tibial, and posterior tibial arteries, when vessel segments are accessible. Bilateral testing is considered an integral part of a complete examination. Photoelectric Plethysmograph (PPG) waveforms and toe systolic pressure readings are included as required and additional duplex testing as needed. Limited examinations for reoccurring indications may be performed as noted.  ABI Findings: +---------+------------------+-----+----------+--------------------------------+ Right    Rt Pressure (mmHg)IndexWaveform  Comment                           +---------+------------------+-----+----------+--------------------------------+ Brachial  115                    triphasic                                  +---------+------------------+-----+----------+--------------------------------+ PTA      107               0.93 monophasic                                 +---------+------------------+-----+----------+--------------------------------+ DP       71                0.62 monophasic                                 +---------+------------------+-----+----------+--------------------------------+ Great Toe                                 Unable to evaluate due to wounds +---------+------------------+-----+----------+--------------------------------+ +---------+------------------+-----+--------+----------------------------+ Left     Lt Pressure (mmHg)IndexWaveformComment                      +---------+------------------+-----+--------+----------------------------+ Brachial                                Unable to evaluate due to IV +---------+------------------+-----+--------+----------------------------+ PTA                                     BKA                          +---------+------------------+-----+--------+----------------------------+ DP                                      BKA                          +---------+------------------+-----+--------+----------------------------+ Great Toe                               BKA                          +---------+------------------+-----+--------+----------------------------+ +-------+-----------+-----------+------------+------------+ ABI/TBIToday's ABIToday's TBIPrevious ABIPrevious TBI +-------+-----------+-----------+------------+------------+ Right  0.93                                           +-------+-----------+-----------+------------+------------+  Summary: Right: Resting right ankle-brachial index indicates mild right lower extremity  arterial disease, however diminished waveforms indicate this ABI may be falsely elevated. Left: BKA  *See table(s) above for measurements and observations.  Electronically signed by Monica Martinez MD on 08/27/2019 at 6:11:13 PM.    Final    ECHOCARDIOGRAM COMPLETE  Result Date: 08/13/2019   ECHOCARDIOGRAM REPORT   Patient Name:   [name] Date of Exam: 08/13/2019 Medical Rec #:  [id number]         Height:       69.0 in Accession #:    [id number]        Weight:       193.8 lb Date of Birth:  04-25-1966         BSA:          2.04 m Patient Age:    88 years          BP:           92/66 mmHg Patient Gender: M                 HR:           97 bpm. Exam Location:  Inpatient Procedure: 2D Echo, Cardiac Doppler and Color Doppler Indications:    Cardiac Arrest 427.5  History:        Patient has no prior history of Echocardiogram examinations.  Sonographer:    Jonelle Sidle Dance Referring Phys: [id number] PAULA B SIMPSON  Sonographer Comments: Echo performed with patient supine and on artificial respirator. Image acquisition challenging due to respiratory motion. IMPRESSIONS  1. Left ventricular ejection fraction, by visual estimation, is 30 to 35%. The left ventricle has moderately decreased function. There is mildly increased left ventricular hypertrophy.  2. Left ventricular diastolic parameters are consistent with Grade I diastolic dysfunction (impaired relaxation).  3. The left ventricle demonstrates global hypokinesis.  4. Global right ventricle has mildly reduced systolic function.The right ventricular size is normal. No increase in right ventricular wall thickness.  5. Left atrial size was normal.  6. Right atrial size was normal.  7. The pericardial effusion is circumferential.  8. Trivial pericardial effusion is present.  9. The mitral valve is normal in structure. No evidence of mitral valve regurgitation. 10. The tricuspid valve is normal in structure. 11. The tricuspid valve is normal in structure. Tricuspid  valve regurgitation is trivial. 12. The aortic valve is normal in structure. Aortic valve regurgitation is not visualized. 13. The pulmonic valve was grossly normal. Pulmonic valve regurgitation is not visualized. 14. Normal pulmonary artery systolic pressure. 15. The tricuspid regurgitant velocity is 2.32 m/s, and with an assumed right atrial pressure of 3 mmHg, the estimated right ventricular systolic pressure is normal at 24.5 mmHg. 16. The inferior vena cava is normal in size with greater than 50% respiratory variability, suggesting right atrial pressure of 3 mmHg. 17. No significant change from prior study. 18. Prior images reviewed side by side. FINDINGS  Left Ventricle: Left ventricular ejection fraction, by visual estimation, is 30 to 35%. The left ventricle has moderately decreased function. The left ventricle demonstrates global hypokinesis. The left ventricular internal cavity size was the left ventricle is normal in size. There is mildly increased left ventricular hypertrophy. Concentric left ventricular hypertrophy. Left ventricular diastolic parameters are consistent with Grade I diastolic dysfunction (impaired relaxation). Right Ventricle: The right ventricular size is normal. No increase in right ventricular wall thickness. Global RV systolic function is has mildly reduced systolic function. The tricuspid regurgitant velocity is 2.32 m/s, and with an assumed right atrial pressure of 3 mmHg, the estimated right ventricular systolic pressure is normal at 24.5 mmHg. Left Atrium: Left atrial size was normal in size. Right Atrium: Right atrial size was normal in size Pericardium: Trivial pericardial effusion is present. The pericardial effusion is circumferential. Mitral Valve: The mitral valve is normal in structure. No evidence of mitral valve regurgitation. Tricuspid Valve: The tricuspid valve is normal  in structure. Tricuspid valve regurgitation is trivial. Aortic Valve: The aortic valve is normal in  structure. Aortic valve regurgitation is not visualized. Pulmonic Valve: The pulmonic valve was grossly normal. Pulmonic valve regurgitation is not visualized. Pulmonic regurgitation is not visualized. Aorta: The aortic root and ascending aorta are structurally normal, with no evidence of dilitation. Venous: The inferior vena cava is normal in size with greater than 50% respiratory variability, suggesting right atrial pressure of 3 mmHg. IAS/Shunts: No atrial level shunt detected by color flow Doppler. Additional Comments: Little change from 06/08/2019 (note different MRN).  LEFT VENTRICLE PLAX 2D LVIDd:         4.20 cm  Diastology LVIDs:         3.90 cm  LV e' lateral:   5.78 cm/s LV PW:         1.30 cm  LV E/e' lateral: 7.5 LV IVS:        1.20 cm  LV e' medial:    5.78 cm/s LVOT diam:     2.10 cm  LV E/e' medial:  7.5 LV SV:         13 ml LV SV Index:   6.06 LVOT Area:     3.46 cm  RIGHT VENTRICLE            IVC RV Basal diam:  2.10 cm    IVC diam: 1.40 cm RV S prime:     8.24 cm/s TAPSE (M-mode): 1.6 cm LEFT ATRIUM           Index       RIGHT ATRIUM          Index LA diam:      2.70 cm 1.32 cm/m  RA Area:     9.52 cm LA Vol (A4C): 26.9 ml 13.20 ml/m RA Volume:   18.20 ml 8.93 ml/m  AORTIC VALVE LVOT Vmax:   80.20 cm/s LVOT Vmean:  53.900 cm/s LVOT VTI:    0.126 m  AORTA Ao Root diam: 3.50 cm Ao Asc diam:  2.50 cm MITRAL VALVE                        TRICUSPID VALVE MV Area (PHT): 3.53 cm             TR Peak grad:   21.5 mmHg MV PHT:        62.35 msec           TR Vmax:        232.00 cm/s MV Decel Time: 215 msec MV E velocity: 43.30 cm/s 103 cm/s  SHUNTS MV A velocity: 59.60 cm/s 70.3 cm/s Systemic VTI:  0.13 m MV E/A ratio:  0.73       1.5       Systemic Diam: 2.10 cm  Dani Gobble Croitoru MD Electronically signed by Sanda Klein MD Signature Date/Time: 08/13/2019/2:38:10 PM    Final    VAS [place] LOWER EXTREMITY ARTERIAL DUPLEX  Result Date: 08/27/2019 LOWER EXTREMITY ARTERIAL DUPLEX STUDY Indications: Gangrene,  peripheral artery disease, and left BKA and Abnormal ABI. High Risk Factors: Hypertension, hyperlipidemia, Diabetes. Other Factors: ESRD.  Current ABI: Right= 0.93 (possibly falsely elevated). Left- BKA Limitations: Bandaging Comparison Study: No prior study Performing Technologist: Maudry Mayhew MHA, RDMS, RVT, RDCS  Examination Guidelines: A complete evaluation includes B-mode imaging, spectral Doppler, color Doppler, and power Doppler as needed of all accessible portions of each vessel. Bilateral testing is considered an integral part of a  complete examination. Limited examinations for reoccurring indications may be performed as noted.  +-----------+--------+-----+--------+----------+-------------------+ RIGHT      PSV cm/sRatioStenosisWaveform  Comments            +-----------+--------+-----+--------+----------+-------------------+ EIA Distal 115                  triphasic                     +-----------+--------+-----+--------+----------+-------------------+ CFA Distal 151                  triphasic                     +-----------+--------+-----+--------+----------+-------------------+ DFA        148                  triphasic                     +-----------+--------+-----+--------+----------+-------------------+ SFA Prox   172                  monophasicheterogenous plaque +-----------+--------+-----+--------+----------+-------------------+ SFA Mid    85                   monophasic                    +-----------+--------+-----+--------+----------+-------------------+ SFA Distal 83                   monophasic                    +-----------+--------+-----+--------+----------+-------------------+ POP Prox   93                   monophasic                    +-----------+--------+-----+--------+----------+-------------------+ POP Distal 60                   monophasic                     +-----------+--------+-----+--------+----------+-------------------+ TP Trunk   53                   monophasic                    +-----------+--------+-----+--------+----------+-------------------+ ATA Distal 68                   monophasic                    +-----------+--------+-----+--------+----------+-------------------+ PTA Distal 23                   monophasic                    +-----------+--------+-----+--------+----------+-------------------+ PERO Distal             occludedabsent                        +-----------+--------+-----+--------+----------+-------------------+ DP         31                   monophasic                    +-----------+--------+-----+--------+----------+-------------------+  Summary: Right: No hemodynamically significant stenosis obsverved in the visualized arteries of the right lower extremity,  however waveforms are dampened distally. Two vessel runoff (DP and PT) is visualized.  See table(s) above for measurements and observations. Electronically signed by Monica Martinez MD on 08/27/2019 at 6:12:52 PM.    Final    VAS [place] LOWER EXTREMITY VENOUS (DVT)  Result Date: 08/24/2019  Lower Venous DVT Study Indications: Edema.  Risk Factors: Surgery L BKA. Limitations: Body habitus, poor ultrasound/tissue interface, bandages, open wound and patient pain tolerance, patient positioning, patient immobility. Comparison Study: No prior studies. Performing Technologist: Oliver Hum RVT  Examination Guidelines: A complete evaluation includes B-mode imaging, spectral Doppler, color Doppler, and power Doppler as needed of all accessible portions of each vessel. Bilateral testing is considered an integral part of a complete examination. Limited examinations for reoccurring indications may be performed as noted. The reflux portion of the exam is performed with the patient in reverse Trendelenburg.   +---------+---------------+---------+-----------+----------+--------------+ RIGHT    CompressibilityPhasicitySpontaneityPropertiesThrombus Aging +---------+---------------+---------+-----------+----------+--------------+ CFV      Full           No       No                                  +---------+---------------+---------+-----------+----------+--------------+ SFJ      Full                                                        +---------+---------------+---------+-----------+----------+--------------+ FV Prox  Full                                                        +---------+---------------+---------+-----------+----------+--------------+ FV Mid   Full                                                        +---------+---------------+---------+-----------+----------+--------------+ FV DistalFull                                                        +---------+---------------+---------+-----------+----------+--------------+ PFV      Full                                                        +---------+---------------+---------+-----------+----------+--------------+ POP      Full           No       No                                  +---------+---------------+---------+-----------+----------+--------------+ PTV      Full                                                        +---------+---------------+---------+-----------+----------+--------------+  PERO     Full                                                        +---------+---------------+---------+-----------+----------+--------------+ Arterial flow detected in the popliteal, posterior tibial, peroneal, and anterior tibial arteries demonstrate multiphasic waveforms.  +-------+---------------+---------+-----------+----------+--------------+ LEFT   CompressibilityPhasicitySpontaneityPropertiesThrombus Aging  +-------+---------------+---------+-----------+----------+--------------+ CFV    None           No       No                   Acute          +-------+---------------+---------+-----------+----------+--------------+ FV ProxNone           No       No                   Acute          +-------+---------------+---------+-----------+----------+--------------+ Unable to obtain images distal to the proximal femoral vein due to open wounds and bandages. Unable to obtain iliac images due to patient body habitus, edema, and positioning.    Summary: RIGHT: - There is no evidence of deep vein thrombosis in the lower extremity. However, portions of this examination were limited- see technologist comments above.  - No cystic structure found in the popliteal fossa.  LEFT: - Findings consistent with acute deep vein thrombosis involving the left common femoral vein, and left femoral vein. - No cystic structure found in the popliteal fossa.  *See table(s) above for measurements and observations. Electronically signed by Deitra Mayo MD on 08/24/2019 at 3:09:08 PM.    Final    [place] EKG SITE RITE  Result Date: 08/18/2019 If Site Rite image not attached, placement could not be confirmed due to current cardiac rhythm.   Microbiology No results found for this or any previous visit (from the past 240 hour(s)).  Lab Basic Metabolic Panel: Recent Labs  Lab 08/21/19 2230 08/22/19 0427 08/22/19 0946 08/22/19 2300 08/23/19 0202 08/23/19 1349 08/23/19 2300 08/24/19 0232 08/24/19 0240 08/24/19 1121 08/25/19 1101 08/25/19 2321 08/26/19 1143 08/27/19 0557 23-Sep-2019 0312  NA   < >  --  140  --  136  --   --   --  139  --   --   --  138 139 140  K   < >  --  4.6  --  4.5  --   --   --  4.6  --   --   --  5.1 4.3 4.9  CL   < >  --  99  --  93*  --   --   --  95*  --   --   --  96* 96* 98  CO2   < >  --  18*  --  20*  --   --   --  18*  --   --   --  19* 20* 16*  GLUCOSE   < >  --  153*   < > 139*   < >    < >  --  154*   < > 142* 98 129* 149* 98  BUN   < >  --  64*  --  75*  --   --   --  78*  --   --   --  108* 60* 74*  CREATININE   < >  --  5.19*  --  5.92*  --   --   --  5.96*  --   --   --  7.76* 5.03* 5.91*  CALCIUM   < >  --  7.7*  --  7.6*  --   --   --  8.0*  --   --   --  8.0* 7.9* 8.1*  MG  --  2.4  --   --   --   --   --  2.3  --   --   --   --   --   --   --   PHOS  --   --  4.7*  --  4.8*  --   --   --   --   --   --   --   --   --   --    < > = values in this interval not displayed.   Liver Function Tests: Recent Labs  Lab 08/22/19 0946 08/23/19 0202 08/24/19 0240 08/26/19 1143  AST  --   --  15 15  ALT  --   --  20 18  ALKPHOS  --   --  127* 127*  BILITOT  --   --  0.6 0.7  PROT  --   --  5.9* 6.4*  ALBUMIN 2.1* 2.0* 1.9* 2.0*   No results for input(s): LIPASE, AMYLASE in the last 168 hours. No results for input(s): AMMONIA in the last 168 hours. CBC: Recent Labs  Lab 08/24/19 0240 08/25/19 0541 08/26/19 0344 08/27/19 0557 09-05-19 0312  WBC 16.0* 15.7* 14.7* 21.7* 14.1*  HGB 7.3* 7.4* 6.0* 8.4* 8.6*  HCT 23.8* 24.6* 20.7* 26.3* 29.0*  MCV 89.8 89.5 92.4 87.7 92.9  PLT 292 368 307 353 307   Cardiac Enzymes: No results for input(s): CKTOTAL, CKMB, CKMBINDEX, TROPONINI in the last 168 hours. Sepsis Labs: Recent Labs  Lab 08/25/19 0541 08/26/19 0344 08/27/19 0557 2019/09/05 0312  WBC 15.7* 14.7* 21.7* 14.1*    Procedures/Operations  CVL by IR 2/04 >>   Roselie Awkward 09/05/2019, 4:40 PM

## 2019-09-13 NOTE — Progress Notes (Signed)
Daily Progress Note   Patient Name: [name]       Date: Aug 29, 2019 DOB: April 24, 1966  Age: 54 y.o. MRN#: [id number] Attending Physician: Juanito Doom, MD Primary Care Physician: [facility] Admit Date: 08/11/2019  Reason for Consultation/Follow-up: Establishing goals of care  Subjective: Patient awake, alert, oriented. Able to participate in discussion. C/o generalized pain.    GOC: Patient's wife and two children at bedside. Patient has had multiple conversations with providers. Patient states he is "tired" and "ready." He just wishes to be "peaceful." Most importantly, he shares how important it was to see his children. Patient does have sisters who April has reached out to. Not responding and [name] does not wish to wait to hear back from them. He is ready for transition to comfort measures and understanding that interventions not aimed at comfort will be discontinued including levo support.  Explained transition to comfort measures and focus on comfort, peace, dignity, and symptom management at EOL. Prepared patient and family that he will likely pass quickly once vasopressor support is discontinued. Prepared them that he will likely pass away in the hospital but that I would certainly try to get him home with hospice if possible. Patient tells [place] he does not want to go home, he is ready and hopes to pass in the hospital.   Dr. Haroldine Laws at bedside. Plan to start continuous dilaudid infusion to ensure comfort/peace/dignity and relief of pain as he nears EOL.   Answered all questions/concerns for patient/family at bedside. Emotional/spiritual support provided. Patient is interested in chaplain visit. Order placed. Wife has PMT contact information.   Length of Stay:  16  Current Medications: Scheduled Meds:  . DULoxetine  20 mg Oral Daily  . feeding supplement (NEPRO CARB STEADY)  237 mL Oral TID BM  . gabapentin  200 mg Oral QHS  .  HYDROmorphone (DILAUDID) injection  3 mg Intravenous NOW  . midodrine  10 mg Oral TID WC  . sodium chloride flush  10-40 mL Intracatheter Q12H    Continuous Infusions: . sodium chloride    . HYDROmorphone      PRN Meds: sodium chloride, acetaminophen, albuterol, bisacodyl, glycopyrrolate, HYDROmorphone, HYDROmorphone (DILAUDID) injection, HYDROmorphone, hydrOXYzine, lidocaine (PF), lidocaine-prilocaine, midazolam, ondansetron (ZOFRAN) IV, polyethylene glycol, senna-docusate, sodium chloride flush  Physical Exam Vitals and nursing  note reviewed.  Constitutional:      General: He is awake.     Appearance: He is ill-appearing.  HENT:     Head: Normocephalic and atraumatic.  Cardiovascular:     Rate and Rhythm: Normal rate.  Pulmonary:     Effort: No tachypnea, accessory muscle usage or respiratory distress.  Abdominal:     Tenderness: There is no abdominal tenderness.  Skin:    Comments: Left BKA, ischemia. Right gangrenous toes  Neurological:     Mental Status: He is alert and oriented to person, place, and time.             Vital Signs: BP (!) 70/50   Pulse (!) 122   Temp (!) 100.8 F (38.2 C) (Oral)   Resp 13   Ht 5\' 9"  (1.753 m)   Wt 97.6 kg   SpO2 96%   BMI 31.77 kg/m  SpO2: SpO2: 96 % O2 Device: O2 Device: Nasal Cannula O2 Flow Rate: O2 Flow Rate (L/min): 1 L/min  Intake/output summary:   Intake/Output Summary (Last 24 hours) at 09/19/2019 1203 Last data filed at 09-19-19 1100 Gross per 24 hour  Intake 271.29 ml  Output 0 ml  Net 271.29 ml   LBM: Last BM Date: 08/27/19 Baseline Weight: Weight: 87.9 kg Most recent weight: Weight: 97.6 kg       Palliative Assessment/Data: PPS 40%    Flowsheet Rows     Most Recent Value  Intake Tab  Referral Department  Critical care  Unit  at Time of Referral  ICU  Palliative Care Primary Diagnosis  Other (Comment)  Palliative Care Type  New Palliative care  Reason for referral  Clarify Goals of Care  Date first seen by Palliative Care  08/26/19  Clinical Assessment  Palliative Performance Scale Score  40%  Psychosocial & Spiritual Assessment  Palliative Care Outcomes  Patient/Family meeting held?  Yes  Who was at the meeting?  patient and wife  Palliative Care Outcomes  Clarified goals of care, Provided end of life care assistance, Provided psychosocial or spiritual support, ACP counseling assistance, Improved pain interventions      Patient Active Problem List   Diagnosis Date Noted  . Acute systolic heart failure ([place])   . Palliative care by specialist   . Goals of care, counseling/discussion   . Gangrene of toe of right foot ([place])   . Atherosclerosis of native arteries of extremities with gangrene, left leg ([place])   . Acute respiratory failure ([place])   . Cardiac arrest ([place]) 07/16/2019  . End-stage renal disease on hemodialysis ([place])   . Hyperkalemia   . Anemia due to chronic kidney disease, on chronic dialysis ([place])   . Encephalopathy acute   . Calciphylaxis     Palliative Care Assessment & Plan   Patient Profile: 54 y.o. male  with past medical history of ESRD on hemodialysis, left BKA, DM type 2, MRSA, anemia of chronic disease admitted on 08/14/2019 with witnessed cardiac arrest, ROSC after 3 minutes. Patient required intubation and vasopressor support. Found to have MRSA pneumonia, treated with vancomycin. Extubated 08/18/19. Hospitalization complicated by ongoing hypotension requiring pressors concerning for new source of infection.  CT L femur 2/7 showed heavily calcified vessels especially of superficial femoral artery, negative for abscess or osteomyelitis. Patient with cold gangrenous right foot and cold gangrenous left BKA. Remains on norepinephrine, vancomycin and cefepime. DVT's left common femoral vein  and left femoral vein. Ortho and plastic surgery consulted for calciphylaxis/limb necrosis. Dr.  Sharol Given discussed with patient potential for above-the-knee amputation on the right with possible revision amputation on the left. Palliative medicine consultation for goals of care.   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 He wishes to be peaceful.   Transition to comfort measures only. Patient/family understand interventions not aimed at comfort will be discontinued.   Patient/family understand he will likely decline quickly once levo discontinued. Anticipate hospital death.   No further dialysis. Discussed with Dr. Jonnie Finner.  Symptom management  Dilaudid gtt  RN may bolus dilaudid via infusion for breakthrough pain/dyspnea/air hunger/tachypnea  Versed prn anxiety  Robinul prn secretions  Spiritual care consult per patient request.  Unrestricted visitor access. Please allow family to visit as patient is nearing EOL.   Code Status: DNR/DNI   Code Status Orders  (From admission, onward)         Start     Ordered   08/25/19 1320  Do not attempt resuscitation (DNR)  Continuous    Question Answer Comment  In the event of cardiac or respiratory ARREST Do not call a "code blue"   In the event of cardiac or respiratory ARREST Do not perform Intubation, CPR, defibrillation or ACLS   In the event of cardiac or respiratory ARREST Use medication by any route, position, wound care, and other measures to relive pain and suffering. May use oxygen, suction and manual treatment of airway obstruction as needed for comfort.      08/25/19 1319        Code Status History    Date Active Date Inactive Code Status Order ID Comments User Context   07/18/2019 1811  08/25/2019 1319 DNR [id number]  Kipp Brood, MD Inpatient   07/20/2019 0358 07/28/2019 1811 Full Code [id number]  Arnell Asal, NP ED   Advance Care Planning Activity       Prognosis:  Hours-days  Discharge Planning:  Anticipated Hospital Death  Care plan was discussed with patient, family (wife, two children), RN, Dr. Haroldine Laws, Dr. Jonnie Finner, Dr. [place] Bells  Thank you for allowing the Palliative Medicine Team to assist in the care of this patient.   Time In: 1130 Time Out: 1210  Total Time 40 Prolonged Time Billed  no      Greater than 50%  of this time was spent counseling and coordinating care related to the above assessment and plan.  Ihor Dow, DNP, FNP-C Palliative Medicine Team  Phone: [phone] Fax: [phone]  Please contact Palliative Medicine Team phone at [phone] for questions and concerns.

## 2019-09-13 NOTE — Progress Notes (Addendum)
30.4cc Dilaudid (0.5mg /ml) wasted in stericycle. Witnessed by Dario Guardian, RN.

## 2019-09-13 NOTE — Progress Notes (Signed)
Pt noted to be asystolic on the heart monitor. No heart tones auscultated by 2 RN's. Time of death declared at 79. Patient's wife, daughter and chaplain at bedside.

## 2019-09-13 NOTE — Progress Notes (Signed)
Advanced Heart Failure Rounding Note   Subjective:    Continues to complain of pain in left leg. On NE 8 and midodrine 10 tid for BP support.   Has decided to proceed with comfort care   Objective:   Weight Range:  Vital Signs:   Temp:  [98.1 F (36.7 C)-98.8 F (37.1 C)] 98.8 F (37.1 C) (02/13 0700) Resp:  [8-29] 21 (02/13 1030) BP: (73-146)/(45-119) 73/51 (02/13 1030) SpO2:  [80 %-100 %] 93 % (02/13 1030) FiO2 (%):  [30 %] 30 % (02/12 1200) Weight:  [97.6 kg] 97.6 kg (02/13 0645) Last BM Date: 08/27/19  Weight change: Filed Weights   08/27/19 0118 08/27/19 0600 09/17/2019 0645  Weight: 93.6 kg 98.1 kg 97.6 kg    Intake/Output:   Intake/Output Summary (Last 24 hours) at Sep 17, 2019 1039 Last data filed at 09-17-2019 1000 Gross per 24 hour  Intake 293.68 ml  Output 0 ml  Net 293.68 ml     Physical Exam: General:  Ill appearing. No resp difficulty HEENT: normal Neck: supple. JVP 8-9 Carotids 2+ bilat; no bruits. No lymphadenopathy or thryomegaly appreciated. Cor: PMI nondisplaced. Regular tachy No rubs, gallops or murmurs. Lungs: clear Abdomen: soft, nontender, nondistended. No hepatosplenomegaly. No bruits or masses. Good bowel sounds. Ext mild edema. R foot dry gangrene. 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 Necrotic tissue up to flank  Foul smelling Neuro: alert & orientedx3, cranial nerves grossly intact. moves all 4 extremities w/o difficulty. Affect pleasant   Telemetry: Sinus tach 130-140 Personally reviewed   Labs: Basic Metabolic Panel: Recent Labs  Lab 08/21/19 2230 08/22/19 0427 08/22/19 0946 08/22/19 2300 08/23/19 0202 08/23/19 0202 08/23/19 1349 08/24/19 0232 08/24/19 0240 08/24/19 0240 08/24/19 1121 08/25/19 1101 08/25/19 2321 08/26/19 1143 08/27/19 0557 17-Sep-2019 0312  NA   < >  --  140  --  136  --   --   --  139  --   --   --   --  138 139 140  K   < >  --  4.6  --  4.5  --   --   --  4.6  --   --    --   --  5.1 4.3 4.9  CL   < >  --  99  --  93*  --   --   --  95*  --   --   --   --  96* 96* 98  CO2   < >  --  18*  --  20*  --   --   --  18*  --   --   --   --  19* 20* 16*  GLUCOSE   < >  --  153*   < > 139*   < >   < >  --  154*  --    < > 142* 98 129* 149* 98  BUN   < >  --  64*  --  75*  --   --   --  78*  --   --   --   --  108* 60* 74*  CREATININE   < >  --  5.19*  --  5.92*  --   --   --  5.96*  --   --   --   --  7.76* 5.03* 5.91*  CALCIUM   < >  --  7.7*  --  7.6*   < >  --   --  8.0*   < >  --   --   --  8.0* 7.9* 8.1*  MG  --  2.4  --   --   --   --   --  2.3  --   --   --   --   --   --   --   --   PHOS  --   --  4.7*  --  4.8*  --   --   --   --   --   --   --   --   --   --   --    < > = values in this interval not displayed.    Liver Function Tests: Recent Labs  Lab 08/22/19 0946 08/23/19 0202 08/24/19 0240 08/26/19 1143  AST  --   --  15 15  ALT  --   --  20 18  ALKPHOS  --   --  127* 127*  BILITOT  --   --  0.6 0.7  PROT  --   --  5.9* 6.4*  ALBUMIN 2.1* 2.0* 1.9* 2.0*   No results for input(s): LIPASE, AMYLASE in the last 168 hours. No results for input(s): AMMONIA in the last 168 hours.  CBC: Recent Labs  Lab 08/24/19 0240 08/25/19 0541 08/26/19 0344 08/27/19 0557 08-29-2019 0312  WBC 16.0* 15.7* 14.7* 21.7* 14.1*  HGB 7.3* 7.4* 6.0* 8.4* 8.6*  HCT 23.8* 24.6* 20.7* 26.3* 29.0*  MCV 89.8 89.5 92.4 87.7 92.9  PLT 292 368 307 353 307    Cardiac Enzymes: No results for input(s): CKTOTAL, CKMB, CKMBINDEX, TROPONINI in the last 168 hours.  BNP: BNP (last 3 results) No results for input(s): BNP in the last 8760 hours.  ProBNP (last 3 results) No results for input(s): PROBNP in the last 8760 hours.    Other results:  Imaging: VAS [place] ABI WITH/WO TBI  Result Date: 08/27/2019 LOWER EXTREMITY DOPPLER STUDY Indications: Ulceration, and gangrene. High Risk Factors: Hypertension, hyperlipidemia, Diabetes. Other Factors: ESRD.  Limitations:  Today's exam was limited due to an open wound and bandages. Comparison Study: No prior study Performing Technologist: Maudry Mayhew MHA, RVT, RDCS, RDMS  Examination Guidelines: A complete evaluation includes at minimum, Doppler waveform signals and systolic blood pressure reading at the level of bilateral brachial, anterior tibial, and posterior tibial arteries, when vessel segments are accessible. Bilateral testing is considered an integral part of a complete examination. Photoelectric Plethysmograph (PPG) waveforms and toe systolic pressure readings are included as required and additional duplex testing as needed. Limited examinations for reoccurring indications may be performed as noted.  ABI Findings: +---------+------------------+-----+----------+--------------------------------+ Right    Rt Pressure (mmHg)IndexWaveform  Comment                          +---------+------------------+-----+----------+--------------------------------+ Brachial 115                    triphasic                                  +---------+------------------+-----+----------+--------------------------------+ PTA      107               0.93 monophasic                                 +---------+------------------+-----+----------+--------------------------------+  DP       71                0.62 monophasic                                 +---------+------------------+-----+----------+--------------------------------+ Great Toe                                 Unable to evaluate due to wounds +---------+------------------+-----+----------+--------------------------------+ +---------+------------------+-----+--------+----------------------------+ Left     Lt Pressure (mmHg)IndexWaveformComment                      +---------+------------------+-----+--------+----------------------------+ Brachial                                Unable to evaluate due to IV  +---------+------------------+-----+--------+----------------------------+ PTA                                     BKA                          +---------+------------------+-----+--------+----------------------------+ DP                                      BKA                          +---------+------------------+-----+--------+----------------------------+ Great Toe                               BKA                          +---------+------------------+-----+--------+----------------------------+ +-------+-----------+-----------+------------+------------+ ABI/TBIToday's ABIToday's TBIPrevious ABIPrevious TBI +-------+-----------+-----------+------------+------------+ Right  0.93                                           +-------+-----------+-----------+------------+------------+  Summary: Right: Resting right ankle-brachial index indicates mild right lower extremity arterial disease, however diminished waveforms indicate this ABI may be falsely elevated. Left: BKA  *See table(s) above for measurements and observations.  Electronically signed by Monica Martinez MD on 08/27/2019 at 6:11:13 PM.    Final    VAS [place] LOWER EXTREMITY ARTERIAL DUPLEX  Result Date: 08/27/2019 LOWER EXTREMITY ARTERIAL DUPLEX STUDY Indications: Gangrene, peripheral artery disease, and left BKA and Abnormal ABI. High Risk Factors: Hypertension, hyperlipidemia, Diabetes. Other Factors: ESRD.  Current ABI: Right= 0.93 (possibly falsely elevated). Left- BKA Limitations: Bandaging Comparison Study: No prior study Performing Technologist: Maudry Mayhew MHA, RDMS, RVT, RDCS  Examination Guidelines: A complete evaluation includes B-mode imaging, spectral Doppler, color Doppler, and power Doppler as needed of all accessible portions of each vessel. Bilateral testing is considered an integral part of a complete examination. Limited examinations for reoccurring indications may be performed as noted.   +-----------+--------+-----+--------+----------+-------------------+ RIGHT      PSV cm/sRatioStenosisWaveform  Comments            +-----------+--------+-----+--------+----------+-------------------+ EIA Distal 115  triphasic                     +-----------+--------+-----+--------+----------+-------------------+ CFA Distal 151                  triphasic                     +-----------+--------+-----+--------+----------+-------------------+ DFA        148                  triphasic                     +-----------+--------+-----+--------+----------+-------------------+ SFA Prox   172                  monophasicheterogenous plaque +-----------+--------+-----+--------+----------+-------------------+ SFA Mid    85                   monophasic                    +-----------+--------+-----+--------+----------+-------------------+ SFA Distal 83                   monophasic                    +-----------+--------+-----+--------+----------+-------------------+ POP Prox   93                   monophasic                    +-----------+--------+-----+--------+----------+-------------------+ POP Distal 60                   monophasic                    +-----------+--------+-----+--------+----------+-------------------+ TP Trunk   53                   monophasic                    +-----------+--------+-----+--------+----------+-------------------+ ATA Distal 68                   monophasic                    +-----------+--------+-----+--------+----------+-------------------+ PTA Distal 23                   monophasic                    +-----------+--------+-----+--------+----------+-------------------+ PERO Distal             occludedabsent                        +-----------+--------+-----+--------+----------+-------------------+ DP         31                   monophasic                     +-----------+--------+-----+--------+----------+-------------------+  Summary: Right: No hemodynamically significant stenosis obsverved in the visualized arteries of the right lower extremity, however waveforms are dampened distally. Two vessel runoff (DP and PT) is visualized.  See table(s) above for measurements and observations. Electronically signed by Monica Martinez MD on 08/27/2019 at 6:12:52 PM.    Final       Medications:     Scheduled Medications: . Chlorhexidine Gluconate Cloth  6 each  Topical Q0600  . Chlorhexidine Gluconate Cloth  6 each Topical Q0600  . cinacalcet  30 mg Oral Q breakfast  . darbepoetin (ARANESP) injection - DIALYSIS  150 mcg Intravenous Q Sat-HD  . DULoxetine  20 mg Oral Daily  . feeding supplement (NEPRO CARB STEADY)  237 mL Oral TID BM  . feeding supplement (PRO-STAT SUGAR FREE 64)  30 mL Oral BID  . gabapentin  200 mg Oral QHS  . insulin aspart  0-15 Units Subcutaneous TID WC  . insulin detemir  8 Units Subcutaneous BID  . midodrine  10 mg Oral TID WC  . multivitamin  1 tablet Oral QHS  . rosuvastatin  5 mg Oral Daily  . sodium chloride flush  10-40 mL Intracatheter Q12H     Infusions: . sodium chloride    . ceFEPime (MAXIPIME) IV Stopped (08/27/19 1810)  . norepinephrine (LEVOPHED) Adult infusion 8 mcg/min (2019-09-26 1000)  . sodium thiosulfate infusion for calciphylaxis    . [START ON 08/30/2019] sodium thiosulfate infusion for calciphylaxis    . vancomycin    . [START ON 08/30/2019] vancomycin       PRN Medications:  sodium chloride, acetaminophen, albuterol, bisacodyl, heparin, heparin, HYDROmorphone (DILAUDID) injection, HYDROmorphone, hydrOXYzine, influenza vac split quadrivalent PF, lidocaine (PF), lidocaine-prilocaine, ondansetron (ZOFRAN) IV, pentafluoroprop-tetrafluoroeth, pneumococcal 23 valent vaccine, polyethylene glycol, senna-docusate, sodium chloride flush   Assessment/Plan:   1. Cardiac Arrest/PEA - felt due to  hypoxia/sepsis   2. Septic Shock  - MRSA Pneumonia in respiratory cultures  - Blood Cx negative.  - On antibiotics.   3. Acute Systolic Heart Failure  - ECHO EF 30-35%.  - Not candidate for cath - Volume managed per HD.   4. Ischemic Left Leg and right leg - D/w  Dr. Sharol Given. Entire left leg is necrotic and spreads into buttock and flank. Would require full hip disarticulation but not enough viable tissue to close  -> no srugical options. Leg will continue die a  5. ESRD  - on HD  6. Hypotension - Co-ox > 70%. Doubt primarily cardiogenic - Persists despite NE and midodrine - Likely combination of sepsis and necrotic leg. Unfortunately no treatment for this  He has now decided to proceed with full comfort care. We discussed timeline of stopping HD and pressors. Likely several days at the most.   Has palliative meeting at [facility] of Stay: 16   Glori Bickers MD September 26, 2019, 10:39 AM  Advanced Heart Failure Team Pager [phone] (M-F; [place])  Please contact [facility] for night-coverage after hours (4p -7a ) and weekends on [email or web address]

## 2019-09-13 NOTE — Progress Notes (Signed)
NAME:  [name], MRN:  [id number], DOB:  05-11-1966, LOS: 57 ADMISSION DATE:  08/13/2019, CONSULTATION DATE:  1/28 REFERRING MD:  Roxanne Mins, CHIEF COMPLAINT:  Cardiac arrest   Brief History   54 y/o male had a witnessed cardiac arrest and ROS after three minutes, intubated for airway protetion, noted to have MRSA pneumonia, gangrenous R foot. Was extubated on 2/3, has been found to have advanced heart failure.  Gangrenous changes in R leg not amenable to surgery as would require disarcticulation and he is too unstable for general anesthesia.  Palliative medicine saw him on 2/12, DNR code status.    Past Medical History  ESRD, HLD, HTN, DM  Significant Hospital Events   1/28 Admitted. On levophed and added vasopressin  1/29 O/N Hyperkalemic, CRRT for correction also GOC discussion with family, made DNR. Still on levophed and multiple analgesics/sedatives. 2/03  Extubated, off pressors 2/04  Attempted bedside central line placement, IR consulted and placed L External Jugular line. 2/07 ACTH stimulation test performed, results normal 2/09  Lower Extremity dopplers reveal DVT in L common femoral vein and L ("superficial") femoral vein 2/11 transfuse 1 unit PRBC 2/12 palliative medicine, advanced heart failure   Consults:  Nephrology Orthopedics Plastics Palliative care  Procedures:  CVL by IR 2/04 >>  Significant Diagnostic Tests:  Echo 1/29 >> EF 30 to 35%, grade 1 DD CT Lt femur 2/07 >> renal osteodystrophy, no abscess or osteomyelitis  Micro Data:  SARS CoV2 PCR 1/28 >> negative Influenza PCR 1/28 >> negative Sputum 1/28 >> MRSA Blood 1/28 >> negative  Antimicrobials:  Zosyn 1/28 >> 1/30 Vancomycin 1/28 >> 2/04 Cefepime 2/07 >> Vancomycin 2/07 >>   Interim history/subjective:  He states that he wants to and is ready to die Notes pain in leg  Objective   Blood pressure (!) 83/60, pulse (!) 122, temperature 98.8 F (37.1 C), temperature source Oral, resp. rate  (!) 25, height 5\' 9"  (1.753 m), weight 97.6 kg, SpO2 (!) 86 %.    FiO2 (%):  [30 %] 30 %   Intake/Output Summary (Last 24 hours) at September 16, 2019 1009 Last data filed at 16-Sep-2019 0600 Gross per 24 hour  Intake 274.91 ml  Output 0 ml  Net 274.91 ml   Filed Weights   08/27/19 0118 08/27/19 0600 09/16/2019 0645  Weight: 93.6 kg 98.1 kg 97.6 kg    Examination:  General:  In pain, in bed HENT: NCAT OP clear PULM: CTA B, normal effort CV: RRR, no mgr GI: BS+, soft, nontender MSK: covered on my exam Neuro: awake, alert, no distress, [facility] Problem list   Cardiac arrest Prolonged QTC Acute hypoxic respiratory failure MRSA pneumonia Acute metabolic encephalopathy from hypoxia  Assessment & Plan:  Septic shock from gangrene L foot No surgical option for management today per ortho > wean off levophed for MAP > 65 > antibiotics to continue  Acute respiratory failure from MRSA pneumonia: improved > wean off O2 for O2 saturation > 90%  Left leg DVT > holding anticoagulation, plan goals of care conversation today, if something changes will re-assess need  ESRD > intermittent hemodialysis per renal  Chronic calciphylaxis with chronic pain > add IV hydromorphone today  Anemia of critical illness > monitor for bleeding  DM2 with hyperglycemia > SSI  Goals of care: patient ready to move towards full comfort measures, confirms with me today, palliative medicine coming by at 11:00.  I added IV dilaudid.  Best practice:  Diet:  regular Pain/Anxiety/Delirium protocol (if indicated): n/a VAP protocol (if indicated): n/a DVT prophylaxis: as above GI prophylaxis: n/a Glucose control: SSI Mobility: bed rest Code Status: DNR Family Communication: I updated his children bedside Disposition: remain in ICU  Labs   CBC: Recent Labs  Lab 08/24/19 0240 08/25/19 0541 08/26/19 0344 08/27/19 0557 2019-09-10 0312  WBC 16.0* 15.7* 14.7* 21.7* 14.1*  HGB 7.3* 7.4*  6.0* 8.4* 8.6*  HCT 23.8* 24.6* 20.7* 26.3* 29.0*  MCV 89.8 89.5 92.4 87.7 92.9  PLT 292 368 307 353 [id number]    Basic Metabolic Panel: Recent Labs  Lab 08/21/19 2230 08/22/19 0427 08/22/19 0946 08/22/19 2300 08/23/19 0202 08/23/19 1349 08/23/19 2300 08/24/19 0232 08/24/19 0240 08/24/19 1121 08/25/19 1101 08/25/19 2321 08/26/19 1143 08/27/19 0557 2019-09-10 0312  NA   < >  --  140  --  136  --   --   --  139  --   --   --  138 139 140  K   < >  --  4.6  --  4.5  --   --   --  4.6  --   --   --  5.1 4.3 4.9  CL   < >  --  99  --  93*  --   --   --  95*  --   --   --  96* 96* 98  CO2   < >  --  18*  --  20*  --   --   --  18*  --   --   --  19* 20* 16*  GLUCOSE   < >  --  153*   < > 139*   < >   < >  --  154*   < > 142* 98 129* 149* 98  BUN   < >  --  64*  --  75*  --   --   --  78*  --   --   --  108* 60* 74*  CREATININE   < >  --  5.19*  --  5.92*  --   --   --  5.96*  --   --   --  7.76* 5.03* 5.91*  CALCIUM   < >  --  7.7*  --  7.6*  --   --   --  8.0*  --   --   --  8.0* 7.9* 8.1*  MG  --  2.4  --   --   --   --   --  2.3  --   --   --   --   --   --   --   PHOS  --   --  4.7*  --  4.8*  --   --   --   --   --   --   --   --   --   --    < > = values in this interval not displayed.   GFR: Estimated Creatinine Clearance: 16.3 mL/min (A) (by C-G formula based on SCr of 5.91 mg/dL (H)). Recent Labs  Lab 08/25/19 0541 08/26/19 0344 08/27/19 0557 September 10, 2019 0312  WBC 15.7* 14.7* 21.7* 14.1*    Liver Function Tests: Recent Labs  Lab 08/22/19 0946 08/23/19 0202 08/24/19 0240 08/26/19 1143  AST  --   --  15 15  ALT  --   --  20 18  ALKPHOS  --   --  127* 127*  BILITOT  --   --  0.6 0.7  PROT  --   --  5.9* 6.4*  ALBUMIN 2.1* 2.0* 1.9* 2.0*   No results for input(s): LIPASE, AMYLASE in the last 168 hours. No results for input(s): AMMONIA in the last 168 hours.  ABG    Component Value Date/Time   PHART 7.422 08/16/2019 1520   PCO2ART 33.7 08/16/2019 1520   PO2ART  177.0 (H) 08/16/2019 1520   HCO3 21.9 08/16/2019 1520   TCO2 23 08/16/2019 1520   ACIDBASEDEF 2.0 08/16/2019 1520   O2SAT 73.4 08/27/2019 1248     Coagulation Profile: No results for input(s): INR, PROTIME in the last 168 hours.  Cardiac Enzymes: No results for input(s): CKTOTAL, CKMB, CKMBINDEX, TROPONINI in the last 168 hours.  HbA1C: Hgb A1c MFr Bld  Date/Time Value Ref Range Status  07/30/2019 07:30 PM 5.6 4.8 - 5.6 % Final    Comment:    (NOTE) Pre diabetes:          5.7%-6.4% Diabetes:              >6.4% Glycemic control for   <7.0% adults with diabetes     CBG: Recent Labs  Lab 08/27/19 1223 08/27/19 1606 08/27/19 2128 09-20-19 0023 Sep 20, 2019 0716  GLUCAP 121* 125* 65* 102* 76     Critical care time: 35 minutes      Roselie Awkward, MD [facility] Pager: [phone] Cell: [phone] If no response, call [phone]

## 2019-09-13 NOTE — Progress Notes (Addendum)
[facility] Progress Note  Subjective: pt choosing to transition to comfort care now  Vitals:   09/26/2019 0615 09/26/2019 0630 September 26, 2019 0645 09-26-19 0700  BP: (!) 105/56 (!) 95/55 (!) 117/103   Pulse:      Resp: 10 13 17    Temp:    98.8 F (37.1 C)  TempSrc:    Oral  SpO2: 94% 96% 93%   Weight:   97.6 kg   Height:        Exam: General: awake, alert, comfortable HEENT: MMM Eyes: PERRL Cor: RRR no RG Lungs: Coarse BS, normal WOB Abdomen: Soft obese Extremities: 2+ RLE edema  Skin: large necrotic wound left lateral upper leg w/ sig tissue loss, +malodor; distal L BKA stump tissue necrotic also gangrene of the penis Neuro: responding to conversation appropriately.  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 Chlorhexidine Gluconate Cloth  6 each Topical Q0600  . Chlorhexidine Gluconate Cloth  6 each Topical Q0600  . cinacalcet  30 mg Oral Q breakfast  . darbepoetin (ARANESP) injection - DIALYSIS  150 mcg Intravenous Q Sat-HD  . DULoxetine  20 mg Oral Daily  . feeding supplement (NEPRO CARB STEADY)  237 mL Oral TID BM  . feeding supplement (PRO-STAT SUGAR FREE 64)  30 mL Oral  BID  . gabapentin  200 mg Oral QHS  . insulin aspart  0-15 Units Subcutaneous TID WC  . insulin detemir  8 Units Subcutaneous BID  . midodrine  10 mg Oral TID WC  . multivitamin  1 tablet Oral QHS  . rosuvastatin  5 mg Oral Daily  . sodium chloride flush  10-40 mL Intracatheter Q12H   . sodium chloride    . ceFEPime (MAXIPIME) IV Stopped (08/27/19 1810)  . norepinephrine (LEVOPHED) Adult infusion 5 mcg/min (09/26/19 0600)  . sodium thiosulfate infusion for calciphylaxis    . [START ON 08/30/2019] sodium thiosulfate infusion for calciphylaxis    . vancomycin    . [START ON 08/30/2019] vancomycin     sodium chloride, acetaminophen, albuterol, bisacodyl, heparin, heparin, HYDROmorphone, hydrOXYzine, influenza vac split quadrivalent PF, lidocaine (PF), lidocaine-prilocaine, ondansetron (ZOFRAN) IV, pentafluoroprop-tetrafluoroeth, pneumococcal 23 valent vaccine, polyethylene glycol, senna-docusate, sodium chloride flush Recent Labs  Lab 08/22/19 0946 08/22/19 2300 08/23/19 0202 08/23/19 1349 08/27/19 0557 09/26/2019 0312  NA 140  --  136   < > 139 140  K 4.6  --  4.5   < > 4.3 4.9  CL 99  --  93*   < > 96* 98  CO2 18*  --  20*   < > 20* 16*  GLUCOSE 153*   < > 139*   < > 149* 98  BUN 64*  --  75*   < > 60* 74*  CREATININE 5.19*  --  5.92*   < > 5.03* 5.91*  CALCIUM 7.7*  --  7.6*   < > 7.9* 8.1*  PHOS 4.7*  --  4.8*  --   --   --    < > = values in this interval not displayed.

## 2019-09-13 DEATH — deceased

## 2020-02-23 IMAGING — DX DG CHEST 1V PORT
1 series · 1 of 1 positions shown · non-contrast
Comparison: 02/20/2018

CLINICAL DATA: Respiratory failure.

EXAM:
PORTABLE CHEST 1 VIEW

[chest ap]
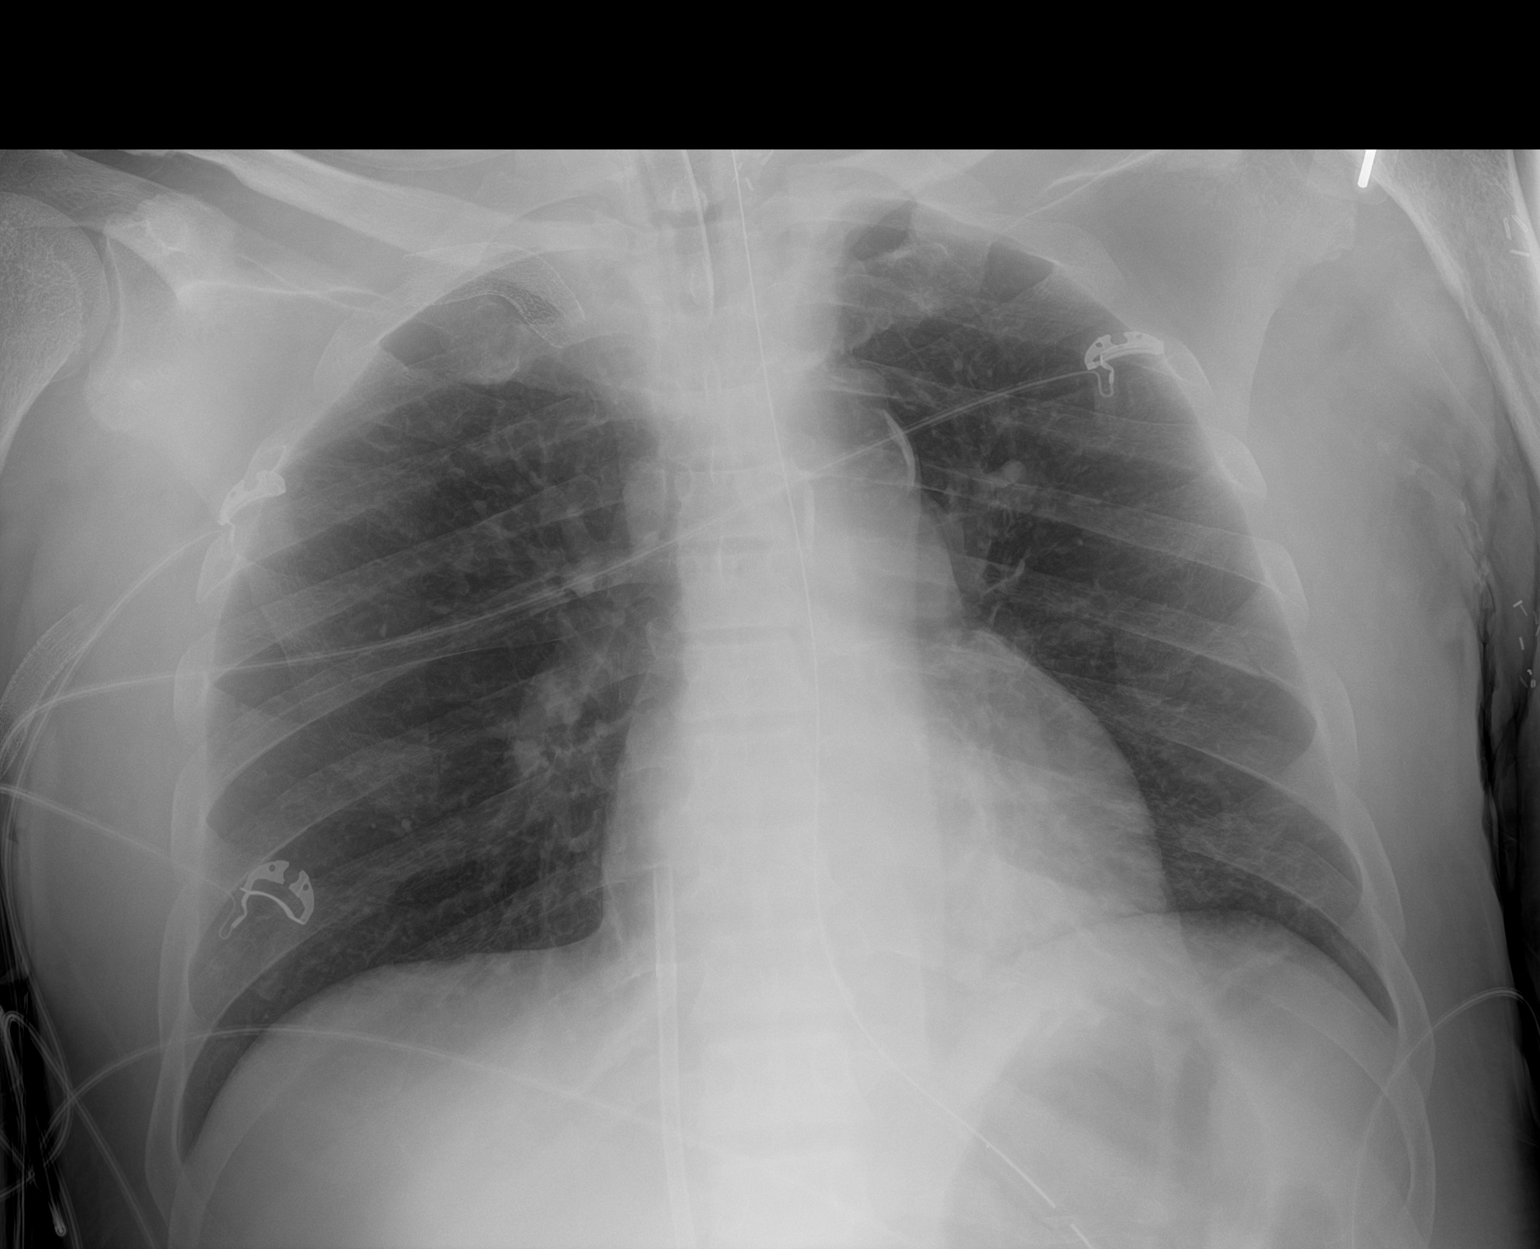

[1 of 1 positions shown; findings below may reference images not displayed]

FINDINGS: The endotracheal tube is 5.5 cm above the carina.

The NG tube is coursing down the esophagus and into the stomach.

A central venous catheter is noted in the right atrium coming up
from the IVC.

Vascular stents are noted. Aortic calcifications are stable.

The heart is normal in size.

Streaky left basilar atelectasis but overall improved lung aeration
since prior study.
IMPRESSION: 1. Stable support apparatus.
2. Improved lung aeration with residual streaky left basilar
atelectasis.

## 2020-02-25 IMAGING — DX DG ABD PORTABLE 1V
1 series · 1 of 1 positions shown · non-contrast
Comparison: 08/16/2019

CLINICAL DATA: OG tube placement

EXAM:
PORTABLE ABDOMEN - 1 VIEW

[abdomen]
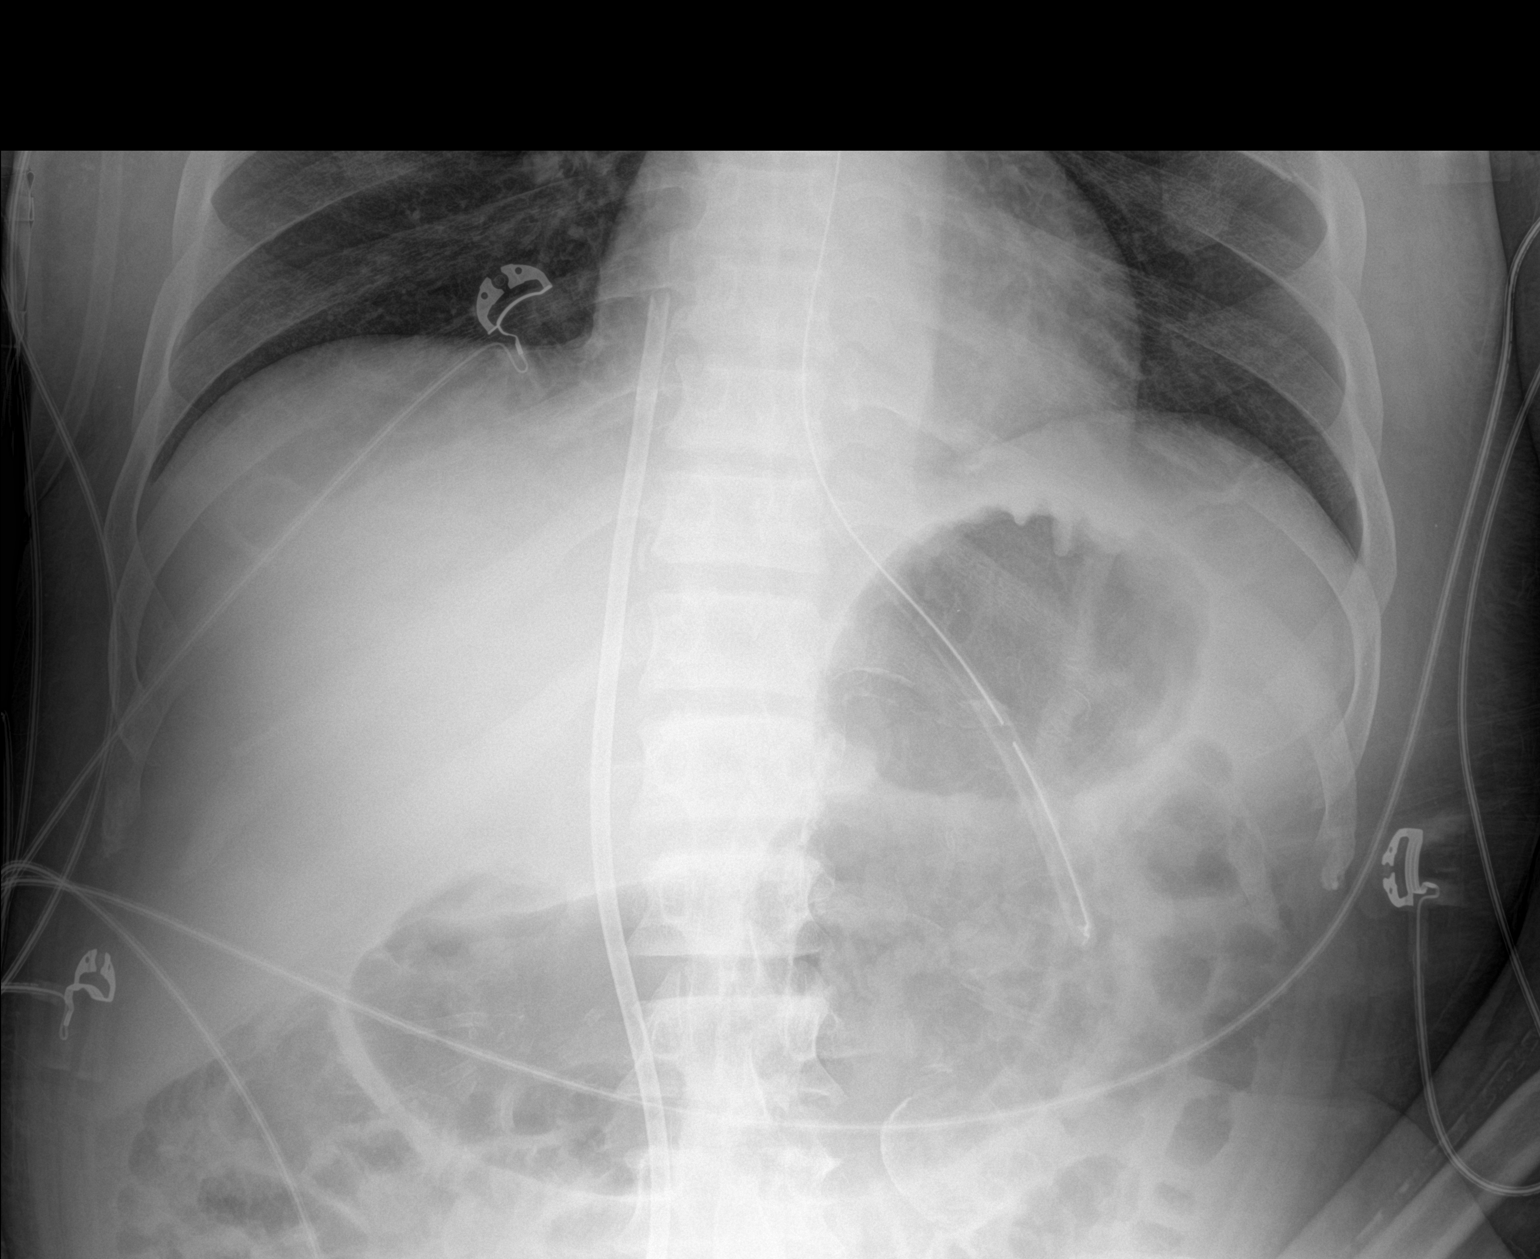

[1 of 1 positions shown; findings below may reference images not displayed]

FINDINGS: The tip of the OG tube projects over the gastric body. There is a
femoral approach dialysis catheter that appears well position. The
stomach is distended. The bowel gas pattern is otherwise
nonspecific.
IMPRESSION: Enteric tube terminates over the stomach.

## 2020-02-26 IMAGING — DX DG CHEST 1V
1 series · 1 of 1 positions shown · non-contrast
Comparison: 08/17/2019

CLINICAL DATA: Central line placement

EXAM:
CHEST  1 VIEW

[chest]
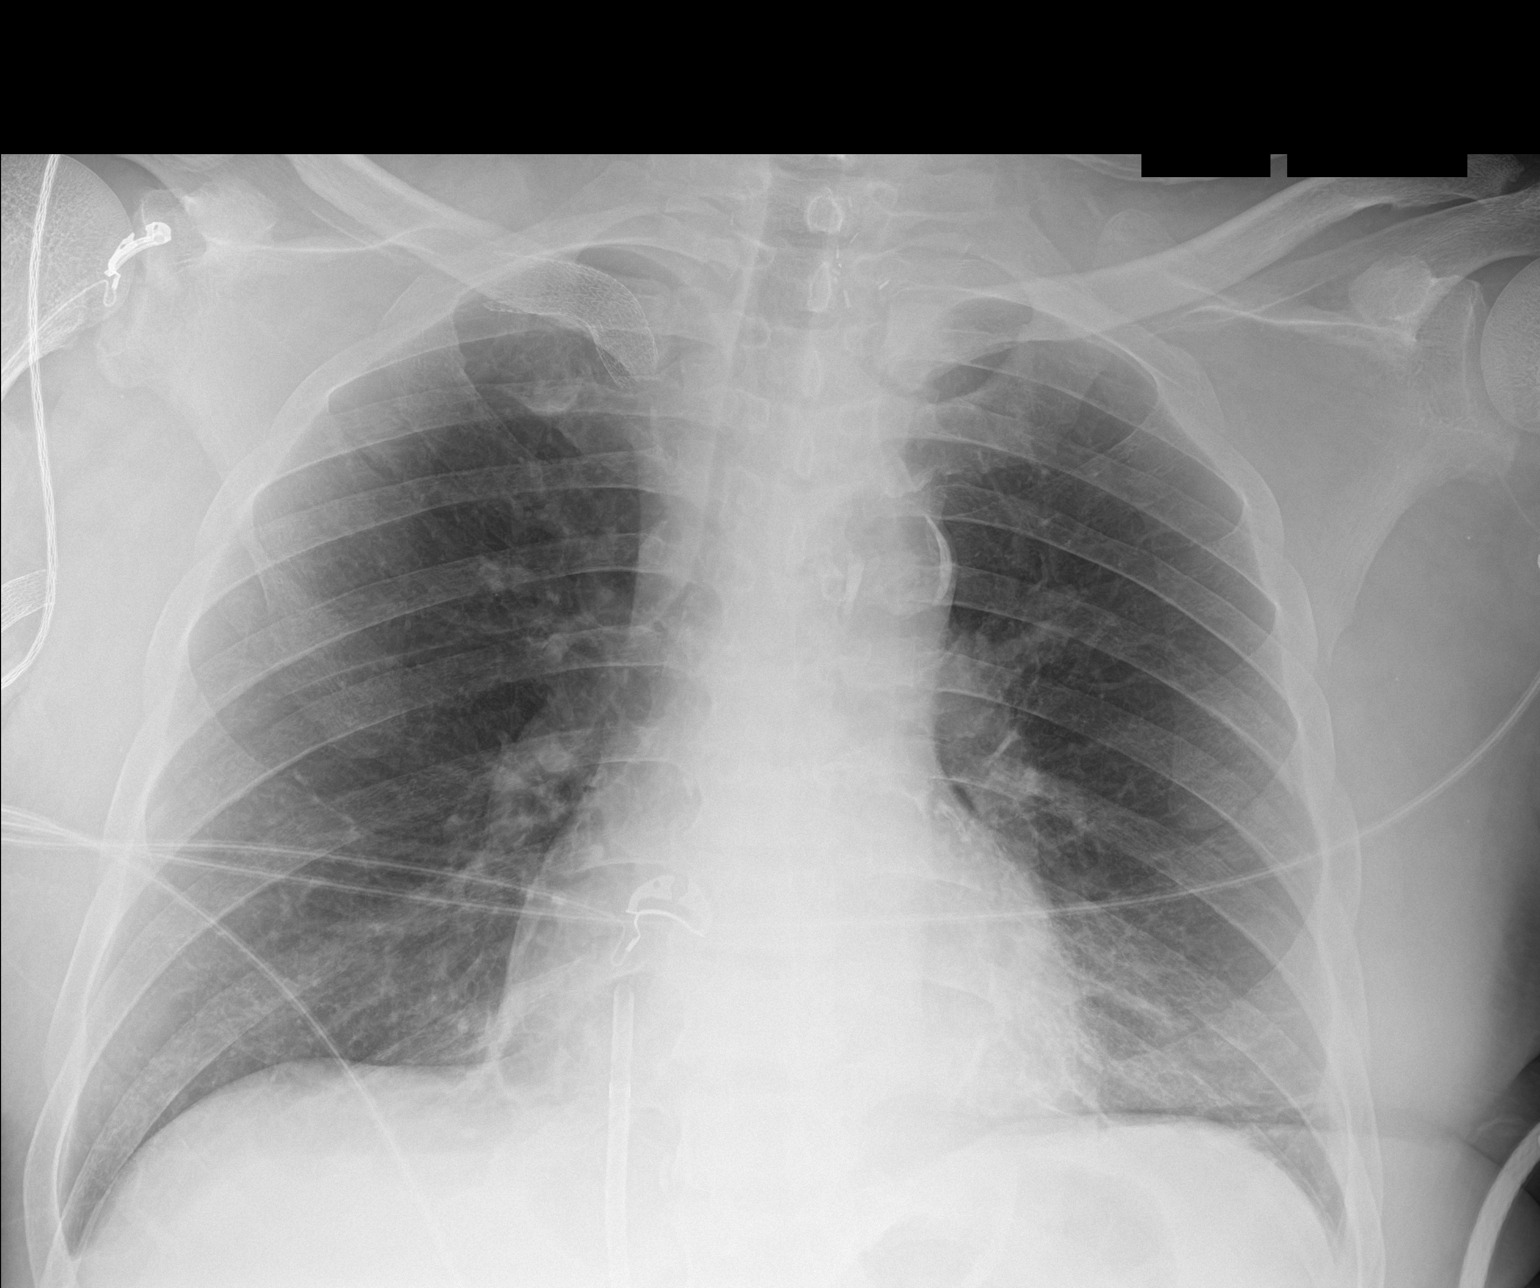

[1 of 1 positions shown; findings below may reference images not displayed]

FINDINGS: Frontal view of the chest demonstrates stable cardiac silhouette.
Vascular stent right subclavian distribution unchanged. Central
venous catheter from an inferior approach tip overlies the junction
of the inferior vena cava and right atrium. I do not see any new
central venous catheter is on this exam.

Stable bibasilar scarring. No airspace disease, effusion, or
pneumothorax.
IMPRESSION: 1. Stable central venous catheter from an inferior approach.
2. No other central lines are identified on this exam.
